# Patient Record
Sex: Male | Born: 1965 | Race: Black or African American | Hispanic: No | Marital: Married | Smoking: Never smoker
Health system: Southern US, Community
[De-identification: ages and names within clinical notes are randomized; demographics above are authoritative.]

## PROBLEM LIST (undated history)

## (undated) DIAGNOSIS — H35039 Hypertensive retinopathy, unspecified eye: Secondary | ICD-10-CM

## (undated) DIAGNOSIS — I10 Essential (primary) hypertension: Secondary | ICD-10-CM

## (undated) DIAGNOSIS — B009 Herpesviral infection, unspecified: Secondary | ICD-10-CM

## (undated) DIAGNOSIS — N289 Disorder of kidney and ureter, unspecified: Secondary | ICD-10-CM

## (undated) DIAGNOSIS — H3320 Serous retinal detachment, unspecified eye: Secondary | ICD-10-CM

## (undated) DIAGNOSIS — N186 End stage renal disease: Secondary | ICD-10-CM

## (undated) DIAGNOSIS — A401 Sepsis due to streptococcus, group B: Secondary | ICD-10-CM

## (undated) DIAGNOSIS — E11319 Type 2 diabetes mellitus with unspecified diabetic retinopathy without macular edema: Secondary | ICD-10-CM

## (undated) DIAGNOSIS — D649 Anemia, unspecified: Secondary | ICD-10-CM

## (undated) HISTORY — DX: Disorder of kidney and ureter, unspecified: N28.9

## (undated) HISTORY — DX: Herpesviral infection, unspecified: B00.9

## (undated) HISTORY — DX: Sepsis due to Streptococcus, group B: A40.1

## (undated) HISTORY — DX: Hypertensive retinopathy, unspecified eye: H35.039

## (undated) HISTORY — PX: EYE SURGERY: SHX253

## (undated) HISTORY — PX: CATARACT EXTRACTION W/ INTRAOCULAR LENS  IMPLANT, BILATERAL: SHX1307

## (undated) HISTORY — PX: CATARACT EXTRACTION: SUR2

## (undated) HISTORY — DX: Serous retinal detachment, unspecified eye: H33.20

## (undated) NOTE — *Deleted (*Deleted)
[facility] Note  06/06/2020     CHIEF COMPLAINT Patient presents for No chief complaint on file.   HISTORY OF PRESENT ILLNESS: Randy Oneal is a 63 y.o. male who presents to the clinic today for:   Pt states he is doing well  Referring physician: [name], MD [address] [place],  [place] [address]  HISTORICAL INFORMATION:   Selected notes from the MEDICAL RECORD NUMBER Referred by Dr.Mark Gershon Crane for concern of decreased vision post cataract sx LEE:  Ocular Hx- PMH-   CURRENT MEDICATIONS: Current Outpatient Medications (Ophthalmic Drugs)  Medication Sig  . brimonidine (ALPHAGAN) 0.2 % ophthalmic solution Place 1 drop into both eyes daily.  . Bromfenac Sodium (PROLENSA) 0.07 % SOLN Place 1 drop into both eyes 4 (four) times daily.  . prednisoLONE acetate (PRED FORTE) 1 % ophthalmic suspension Place 1 drop into both eyes 4 (four) times daily.   No current facility-administered medications for this visit. (Ophthalmic Drugs)   Current Outpatient Medications (Other)  Medication Sig  . acetaminophen (TYLENOL) 500 MG tablet Take 1,000 mg by mouth every 6 (six) hours as needed for moderate pain or headache.  Marland Kitchen amLODipine (NORVASC) 2.5 MG tablet Take 1 tablet (2.5 mg total) by mouth daily.  Marland Kitchen atorvastatin (LIPITOR) 10 MG tablet Take 1 tablet (10 mg total) by mouth daily.  . calcium acetate (PHOSLO) 667 MG capsule Take 1,334 mg by mouth 3 (three) times daily with meals.  . Calcium Acetate 668 (169 Ca) MG TABS TAKE 2 TABLETS BY MOUTH THREE TIMES DAILY WITH MEALS  . cinacalcet (SENSIPAR) 30 MG tablet Take by mouth.  . Continuous Blood Gluc Sensor (FREESTYLE LIBRE SENSOR SYSTEM) MISC Use one sensor every 10 days.  . Darbepoetin Alfa (ARANESP) 60 MCG/0.3ML SOSY injection Inject 0.3 mLs (60 mcg total) into the vein every Monday with hemodialysis.  . famotidine (PEPCID) 40 MG tablet Take 0.5 tablets (20 mg total) by mouth at bedtime.   . insulin lispro (HUMALOG KWIKPEN) 100 UNIT/ML KwikPen Inject 0.06 mLs (6 Units total) into the skin daily with breakfast AND 0.07 mLs (7 Units total) 2 (two) times daily before lunch and supper. (Patient taking differently: inject 5-9 unit with each meal)  . Insulin Pen Needle (BD PEN NEEDLE NANO 2ND GEN) 32G X 4 MM MISC Four times daily  . Methoxy PEG-Epoetin Beta (MIRCERA IJ) Mircera  . metoprolol tartrate (LOPRESSOR) 25 MG tablet Take 1 tablet (25 mg total) by mouth 2 (two) times daily.  Marland Kitchen MICROLET LANCETS MISC USE FOUR TIMES A DAY AS DIRECTED.  Marland Kitchen nystatin (MYCOSTATIN/NYSTOP) powder Apply topically 2 (two) times daily. To groin and scrotum (Patient taking differently: Apply 1 g topically 2 (two) times daily as needed (rash). To groin and scrotum)  . ONETOUCH ULTRA test strip USE TO TEST 4 TIMES DAILY.  . sucroferric oxyhydroxide (VELPHORO) 500 MG chewable tablet Chew 500 mg by mouth 3 (three) times daily with meals.  . TRESIBA FLEXTOUCH 100 UNIT/ML SOPN FlexTouch Pen Inject 0.19 mLs (19 Units total) into the skin at bedtime. (Patient taking differently: Inject 22 Units into the skin at bedtime. )   No current facility-administered medications for this visit. (Other)      REVIEW OF SYSTEMS:    ALLERGIES Allergies  Allergen Reactions  . Tramadol Nausea And Vomiting    PAST MEDICAL HISTORY Past Medical History:  Diagnosis Date  . Anemia   . Diabetes mellitus    Type  2   . Diabetic retinopathy of both eyes ([place]) 01/31/2019   Dr Coralyn Pear 01/2019  . ESRD (end stage renal disease) ([place])    [place] Frenisus  . Herpes simplex virus (HSV) infection    OU  . Hypertension   . Retinal detachment    OS  . Sepsis due to Streptococcus, group B ([place]) 10/05/2018   Past Surgical History:  Procedure Laterality Date  . [place] VITRECTOMY WITH 20 GAUGE MVR PORT FOR MACULAR HOLE Right 02/24/2019   Procedure: 25 GAUGE PARS PLANA VITRECTOMY WITH 20 GAUGE MVR PORT FOR MACULAR HOLE;   Surgeon: Bernarda Caffey, MD;  Location: [place];  Service: Ophthalmology;  Laterality: Right;  . APPLICATION OF WOUND VAC Left 08/27/2018   Procedure: APPLICATION OF WOUND VAC, left neck;  Surgeon: Gaye Pollack, MD;  Location: [facility] [place];  Service: Thoracic;  Laterality: Left;  . APPLICATION OF WOUND VAC Left 08/30/2018   Procedure: APPLICATION OF WOUND VAC;  Surgeon: Gaye Pollack, MD;  Location: [place];  Service: Thoracic;  Laterality: Left;  . AV FISTULA PLACEMENT Left 02/15/2020   Procedure: LEFT ARM ARTERIOVENOUS (AV) FISTULA CREATION;  Surgeon: Waynetta Sandy, MD;  Location: [facility];  Service: Vascular;  Laterality: Left;  . CATARACT EXTRACTION Bilateral   . CATARACT EXTRACTION W/ INTRAOCULAR LENS  IMPLANT, BILATERAL    . COLONOSCOPY  06/24/2011   Procedure: COLONOSCOPY;  Surgeon: [name], MD;  [facility];  Service: Endoscopy;  Laterality: N/A;  8:30 AM  . EYE SURGERY    . GAS/FLUID EXCHANGE Left 03/31/2019   Procedure: Gas/Fluid Exchange;  Surgeon: Bernarda Caffey, MD;  Location: [facility];  Service: Ophthalmology;  Laterality: Left;  . INJECTION OF SILICONE OIL  [id number]   Procedure: Injection Of Silicone Oil;  Surgeon: Bernarda Caffey, MD;  Location: [place];  Service: Ophthalmology;;  . INSERTION OF DIALYSIS CATHETER Right 12/05/2019   Procedure: INSERTION OF DIALYSIS CATHETER RIGHT SUBCLAVIAN;  Surgeon: Virl Cagey, MD;  [facility];  Service: General;  Laterality: Right;  . INSERTION OF DIALYSIS CATHETER Right 12/07/2019   Procedure: Minor Dialysis catheter in place- need to reposition/ adjust catheter to help with flows;  Surgeon: Virl Cagey, MD;  Location: [facility];  Service: General;  Laterality: Right;  procedure room case with 1% lidocaine, full sterile drape,  towels, full gown, large chloraprep, 4-0 Monocryl and dermabond   . INSERTION OF DIALYSIS CATHETER Right 12/08/2019   Procedure: INSERTION OF DIALYSIS CATHETER EXCHANGE;  Surgeon: Virl Cagey, MD;  [facility];  Service: General;  Laterality: Right;  . MEMBRANE PEEL Right 02/24/2019   Procedure: Antoine Primas;  Surgeon: Bernarda Caffey, MD;  Location: [facility];  Service: Ophthalmology;  Laterality: Right;  . PARS PLANA VITRECTOMY Left 03/31/2019   Procedure: PARS PLANA VITRECTOMY WITH 25 GAUGE WITH MEMBRANE PEEL;  Surgeon: Bernarda Caffey, MD;  Location: [place];  Service: Ophthalmology;  Laterality: Left;  . PHOTOCOAGULATION WITH LASER Right 02/24/2019   Procedure: Photocoagulation With Laser;  Surgeon: Bernarda Caffey, MD;  Location: [place];  Service: Ophthalmology;  Laterality: Right;  . PHOTOCOAGULATION WITH LASER Left 03/31/2019   Procedure: Photocoagulation With Laser;  Surgeon: Bernarda Caffey, MD;  Location: [facility];  Service: Ophthalmology;  Laterality: Left;  . RETINAL DETACHMENT SURGERY Left 03/31/2019   [facility] Rep[facility] - Dr. Bernarda Caffey  . SILICON OIL REMOVAL Right [id number]   Procedure: Silicon Oil Removal;  Surgeon: Bernarda Caffey, MD;  Location: [facility];  Service: Ophthalmology;  Laterality: Right;  . STERNAL WOUND DEBRIDEMENT Left 08/27/2018   Procedure: Incision and DEBRIDEMENT Left Chest, Neck and Mediastinum;  Surgeon: Gaye Pollack, MD;  [facility];  Service: Thoracic;  Laterality: Left;  . STERNAL WOUND DEBRIDEMENT Left 08/30/2018   Procedure: WOUND VAC CHANGE, LEFT CHEST AND NECK, POSSIBLE DEBRIDEMENT;  Surgeon: Gaye Pollack, MD;  [facility];  Service: Thoracic;  Laterality: Left;    FAMILY HISTORY Family History  Problem Relation Age of Onset  . Hypertension Mother   . Colon cancer Neg Hx     SOCIAL HISTORY Social History   Tobacco Use  . Smoking status: Never Smoker  . Smokeless tobacco: Never Used  Vaping Use  . Vaping Use: Never used  Substance Use Topics  . Alcohol use: No  . Drug use: No         OPHTHALMIC EXAM:  Not recorded     IMAGING AND PROCEDURES  Imaging and Procedures for @TODAY @           ASSESSMENT/PLAN:     ICD-10-CM   1. Proliferative diabetic retinopathy of both eyes with macular edema associated with type 2 diabetes mellitus ([place])  [id number]   2. 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 Vitreous hemorrhage of both eyes ([place])  H43.13   5. Retinal edema  H35.81   6. Essential hypertension  I10   7. Hypertensive retinopathy of both eyes  H35.033   8. Pseudophakia of both eyes  Z96.1   9. CME (cystoid macular edema), left  H35.352     1-5. Proliferative diabetic retinopathy with DME, TRD, and vitreous hemorrhage OU (OD > OS)  - pt with complex medical history with hospitalization in Jan-Feb 2020 for bacteremia and abscess  - history of poor glycemic control for years  - s/p IVA OD #1 (06.20.20), #2 (07.01.20), #3 (09.11.20), #4 (10.09.20), #5 (11.06.20)  - s/p IVA OS #1 (06.20.20), #2 (07.01.20), #3 (08.13.20), #4 (09.11.20)  #5 (11.06.20)  - s/p IVE OU #1 (12.04.20) - sample, #2 (01.06.21)  - s/p STK OS #1 (10.09.20)  - s/p PRP OS (06.10.20)  - exam showed diffuse vitreous and preretinal hemorrhage + scattered fibrosis OU   - pre-op clearing of VH OD improved visualization of posterior pole --+macular TRD OD; and ?regressing NVD OS  - pre-op OCT shows preretinal fibrosis w/ traction OU (OD > OS)  - s/p PPV/PFC/EL/FAX/silicone oil OD, 123XX123  - s/p subconj silicon oil removal OD 8.20.20  - s/p PPV/EL/FAX/silicone oil OS, 123XX123  - BCVA OD stable at 20/250; OS improved to [id number]  - subconj silicon oil prolapse OD -- persistent but improving             - OD: improved fibrosis, retina attached and in good position under oil, +macular edema, slightly improved; trace residual pockets of SRF inferiorly - improved  - OD with post op CN VI palsy / abduction deficit -- slightly improved today -- continue monitoring -- may need further eval once stable post op  - OS: improved fibrosis, focal RD reattached under oil; interval improvement in  IRF and ERM  - **good response to IVE OU #1**  - recommend IVE OU #3 today, 10.27.21  - [employer] benefits investigation started 12.4.20 -- approved as of 01.06.21  - pt wishes to proceed  - RBA of procedure discussed, questions answered  - informed  consent obtained and signed  - see procedure note  - IOP 19,21             - cont brimonidine bid daily OU - pt ran out, given bottle of Cosopt in office until he can get a refill                                - cont PF 4x/day OU  - cont Prolensa QID OU             - can dc face down positioning; avoid laying flat on back             - post op drop and positioning instructions reviewed             - tylenol/ibuprofen for pain  - f/u 4-5 weeks -- DFE/OCT/possible injection  6,7. Hypertensive retinopathy OU  - discussed importance of tight BP control  - monitor  8. Pseudophakia OU  - s/p CE with IOL Gershon Crane)  9. CME OS  - s/p STK OS #1 as above -- still present in ST quad  - central IRF -- slightly improved post IVE  - complicated by worsening ERM and DME  - continue PF and prolensa qid OS   Ophthalmic Meds Ordered this visit:  No orders of the defined types were placed in this encounter.      No follow-ups on file.  There are no Patient Instructions on file for this visit.   Explained the diagnoses, plan, and follow up with the patient and they expressed understanding.  Patient expressed understanding of the importance of proper follow up care.   This document serves as a record of services personally performed by Gardiner Sleeper, MD, PhD. It was created on their behalf by Roselee Nova, COMT. The creation of this record is the provider's dictation and/or activities during the visit.  Electronically signed by: Roselee Nova, COMT 06/05/20 8:43 AM    Gardiner Sleeper, M.D., Ph.D. Diseases & Surgery of the Retina and [place] 08/17/2019      Abbreviations: M myopia (nearsighted); A  astigmatism; H hyperopia (farsighted); P presbyopia; Mrx spectacle prescription;  CTL contact lenses; OD right eye; OS left eye; OU both eyes  XT exotropia; ET esotropia; PEK punctate epithelial keratitis; PEE punctate epithelial erosions; DES dry eye syndrome; MGD meibomian gland dysfunction; ATs artificial tears; PFAT's preservative free artificial tears; [place] nuclear sclerotic cataract; PSC posterior subcapsular cataract; ERM epi-retinal membrane; PVD posterior vitreous detachment; RD retinal detachment; DM diabetes mellitus; DR diabetic retinopathy; NPDR non-proliferative diabetic retinopathy; PDR proliferative diabetic retinopathy; CSME clinically significant macular edema; DME diabetic macular edema; dbh dot blot hemorrhages; CWS cotton wool spot; POAG primary open angle glaucoma; C/D cup-to-disc ratio; HVF humphrey visual field; GVF goldmann visual field; OCT optical coherence tomography; IOP intraocular pressure; BRVO Branch retinal vein occlusion; CRVO central retinal vein occlusion; CRAO central retinal artery occlusion; BRAO branch retinal artery occlusion; RT retinal tear; SB scleral buckle; PPV pars plana vitrectomy; VH Vitreous hemorrhage; PRP panretinal laser photocoagulation; IVK intravitreal kenalog; VMT vitreomacular traction; MH Macular hole;  NVD neovascularization of the disc; NVE neovascularization elsewhere; AREDS age related eye disease study; ARMD age related macular degeneration; POAG primary open angle glaucoma; EBMD epithelial/anterior basement membrane dystrophy; ACIOL anterior chamber intraocular lens; IOL intraocular lens; PCIOL posterior chamber intraocular lens; Phaco/IOL phacoemulsification with intraocular lens placement; [place] photorefractive keratectomy;  LASIK laser assisted in situ keratomileusis; HTN hypertension; DM diabetes mellitus; COPD chronic obstructive pulmonary disease

---
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
## 2000-09-08 ENCOUNTER — Encounter: Admission: RE | Admit: 2000-09-08 | Discharge: 2000-12-07 | Payer: Self-pay | Admitting: Family Medicine

## 2002-04-21 ENCOUNTER — Emergency Department (HOSPITAL_COMMUNITY): Admission: EM | Admit: 2002-04-21 | Discharge: 2002-04-21 | Payer: Self-pay | Admitting: *Deleted

## 2006-12-26 ENCOUNTER — Emergency Department (HOSPITAL_COMMUNITY): Admission: EM | Admit: 2006-12-26 | Discharge: 2006-12-27 | Payer: Self-pay | Admitting: Emergency Medicine

## 2011-05-20 ENCOUNTER — Other Ambulatory Visit: Payer: Self-pay | Admitting: Family Medicine

## 2011-05-20 DIAGNOSIS — R634 Abnormal weight loss: Secondary | ICD-10-CM

## 2011-05-22 ENCOUNTER — Ambulatory Visit (HOSPITAL_COMMUNITY)
Admission: RE | Admit: 2011-05-22 | Discharge: 2011-05-22 | Disposition: A | Discharge status: Home / Self Care | Payer: 59 | Source: Ambulatory Visit | Attending: Family Medicine | Admitting: Family Medicine

## 2011-05-22 ENCOUNTER — Encounter (HOSPITAL_COMMUNITY): Payer: Self-pay

## 2011-05-22 ENCOUNTER — Other Ambulatory Visit: Payer: Self-pay | Admitting: Family Medicine

## 2011-05-22 DIAGNOSIS — R634 Abnormal weight loss: Secondary | ICD-10-CM

## 2011-05-22 DIAGNOSIS — R1012 Left upper quadrant pain: Secondary | ICD-10-CM | POA: Insufficient documentation

## 2011-05-22 DIAGNOSIS — R1011 Right upper quadrant pain: Secondary | ICD-10-CM | POA: Insufficient documentation

## 2011-05-22 DIAGNOSIS — I10 Essential (primary) hypertension: Secondary | ICD-10-CM | POA: Insufficient documentation

## 2011-05-22 HISTORY — DX: Essential (primary) hypertension: I10

## 2011-05-22 MED ORDER — IOHEXOL 300 MG/ML  SOLN
100.0000 mL | Freq: Once | INTRAMUSCULAR | Status: AC | PRN
  Administered 2011-05-22: 100 mL via INTRAVENOUS

## 2011-05-23 ENCOUNTER — Telehealth: Payer: Self-pay | Admitting: Gastroenterology

## 2011-05-23 NOTE — Telephone Encounter (Signed)
Pt returned your call- he can be reached at [phone]

## 2011-05-26 NOTE — Telephone Encounter (Signed)
Called [phone] and VM not set up. Called his wife, Clearence Ped number [phone] and left message for a return call.

## 2011-05-27 ENCOUNTER — Other Ambulatory Visit: Payer: Self-pay

## 2011-05-27 ENCOUNTER — Telehealth: Payer: Self-pay

## 2011-05-27 DIAGNOSIS — Z139 Encounter for screening, unspecified: Secondary | ICD-10-CM

## 2011-05-27 NOTE — Telephone Encounter (Signed)
Called [place] again. She gave me pt's cell number of [phone]. I called and got many rings and no answer.

## 2011-05-27 NOTE — Telephone Encounter (Signed)
Pt called back and was triaged.

## 2011-05-28 NOTE — Telephone Encounter (Signed)
Gastroenterology Pre-Procedure Form  Request Date: 05/27/2011      Requesting Physician: Dr. Sallee Lange     PATIENT INFORMATION:  Randy Oneal is a 45 y.o., male (DOB=09/22/1965).  PROCEDURE: Procedure(s) requested: colonoscopy Procedure Reason: screening for colon cancer  PATIENT REVIEW QUESTIONS: The patient reports the following:   1. Diabetes Melitis: yes  2. Joint replacements in the past 12 months: no 3. Major health problems in the past 3 months: no 4. Has an artificial valve or MVP:no 5. Has been advised in past to take antibiotics in advance of a procedure like teeth cleaning: no}    MEDICATIONS & ALLERGIES:    Patient reports the following regarding taking any blood thinners:   Plavix? no Aspirin?no Coumadin?  no  Patient confirms/reports the following medications:  Current Outpatient Prescriptions  Medication Sig Dispense Refill  . amLODipine (NORVASC) 10 MG tablet Take 10 mg by mouth daily.        . insulin lispro protamine-insulin lispro (HUMALOG 75/25) (75-25) 100 UNIT/ML SUSP Inject into the skin. Takes 15 units in the AM and 10 units in the PM       . lisinopril (PRINIVIL,ZESTRIL) 20 MG tablet Take 20 mg by mouth daily.          Patient confirms/reports the following allergies:  No Known Allergies  Patient is appropriate to schedule for requested procedure(s): yes  AUTHORIZATION INFORMATION Primary Insurance:   ID #:   Group #:  Pre-Cert / Auth required:  [id number]}Pre-Cert / Auth #:   Secondary Insurance:   ID #:  Group #:  Pre-Cert / Auth required:  Pre-Cert / Auth #:   No orders of the defined types were placed in this encounter.    SCHEDULE INFORMATION: Procedure has been scheduled as follows:  Date: 06/24/2011    Time: 8:15 AM  Location: [facility] Short Stay  This Gastroenterology Pre-Precedure Form is being routed to the following provider(s) for review: Barney Drain, MD

## 2011-05-28 NOTE — Telephone Encounter (Signed)
[place]

## 2011-05-29 NOTE — Telephone Encounter (Signed)
Will pt need any insulin adjustments?

## 2011-05-29 NOTE — Telephone Encounter (Signed)
Routed to Dr. Oneida Alar.

## 2011-06-06 NOTE — Telephone Encounter (Signed)
TAKE 1/2 INSULIN PM BEFORE AND HOLD AM INSULIN PREPOPIK SPLIT DOSING

## 2011-06-09 ENCOUNTER — Encounter: Payer: Self-pay | Admitting: Gastroenterology

## 2011-06-09 NOTE — Telephone Encounter (Signed)
Done and mailed

## 2011-06-09 NOTE — Telephone Encounter (Signed)
Informed pt's wife. He will be by to pick up the prep and instructions. Placed at the front for pick up.

## 2011-06-09 NOTE — Telephone Encounter (Signed)
To [place] for the prep instructions on the new prep.

## 2011-06-23 MED ORDER — SODIUM CHLORIDE 0.45 % IV SOLN
Freq: Once | INTRAVENOUS | Status: AC
  Administered 2011-06-24: 08:00:00 via INTRAVENOUS

## 2011-06-24 ENCOUNTER — Ambulatory Visit (HOSPITAL_COMMUNITY)
Admission: RE | Admit: 2011-06-24 | Discharge: 2011-06-24 | Disposition: A | Discharge status: Home / Self Care | Payer: 59 | Source: Ambulatory Visit | Attending: Gastroenterology | Admitting: Gastroenterology

## 2011-06-24 ENCOUNTER — Encounter (HOSPITAL_COMMUNITY)
Admission: RE | Disposition: A | Discharge status: Home / Self Care | Payer: Self-pay | Source: Ambulatory Visit | Attending: Gastroenterology

## 2011-06-24 ENCOUNTER — Encounter (HOSPITAL_COMMUNITY): Payer: Self-pay | Admitting: *Deleted

## 2011-06-24 ENCOUNTER — Other Ambulatory Visit: Payer: Self-pay | Admitting: Gastroenterology

## 2011-06-24 DIAGNOSIS — Z1211 Encounter for screening for malignant neoplasm of colon: Secondary | ICD-10-CM

## 2011-06-24 DIAGNOSIS — I10 Essential (primary) hypertension: Secondary | ICD-10-CM | POA: Insufficient documentation

## 2011-06-24 DIAGNOSIS — Z139 Encounter for screening, unspecified: Secondary | ICD-10-CM

## 2011-06-24 DIAGNOSIS — K648 Other hemorrhoids: Secondary | ICD-10-CM

## 2011-06-24 DIAGNOSIS — E119 Type 2 diabetes mellitus without complications: Secondary | ICD-10-CM | POA: Insufficient documentation

## 2011-06-24 DIAGNOSIS — Z79899 Other long term (current) drug therapy: Secondary | ICD-10-CM | POA: Insufficient documentation

## 2011-06-24 DIAGNOSIS — Z01812 Encounter for preprocedural laboratory examination: Secondary | ICD-10-CM | POA: Insufficient documentation

## 2011-06-24 DIAGNOSIS — D126 Benign neoplasm of colon, unspecified: Secondary | ICD-10-CM

## 2011-06-24 HISTORY — PX: COLONOSCOPY: SHX5424

## 2011-06-24 LAB — GLUCOSE, CAPILLARY

## 2011-06-24 SURGERY — COLONOSCOPY
Anesthesia: Moderate Sedation

## 2011-06-24 MED ORDER — MIDAZOLAM HCL 5 MG/5ML IJ SOLN
INTRAMUSCULAR | Status: AC
  Filled 2011-06-24: qty 10

## 2011-06-24 MED ORDER — MIDAZOLAM HCL 5 MG/5ML IJ SOLN
INTRAMUSCULAR | Status: DC | PRN
  Administered 2011-06-24: 2 mg via INTRAVENOUS

## 2011-06-24 MED ORDER — MEPERIDINE HCL 100 MG/ML IJ SOLN
INTRAMUSCULAR | Status: AC
  Filled 2011-06-24: qty 2

## 2011-06-24 MED ORDER — MEPERIDINE HCL 100 MG/ML IJ SOLN
INTRAMUSCULAR | Status: DC | PRN
  Administered 2011-06-24: 50 mg via INTRAVENOUS

## 2011-06-24 MED ORDER — STERILE WATER FOR IRRIGATION IR SOLN
Status: DC | PRN
  Administered 2011-06-24: 09:00:00

## 2011-06-24 NOTE — H&P (Signed)
  Primary Care Physician:  Sallee Lange, MD, MD Primary Gastroenterologist:  Dr. Oneida Alar  Pre-Procedure History & Physical: HPI:  Randy Oneal is a 45 y.o. male here for colon cancer screening.  Past Medical History  Diagnosis Date  . Hypertension   . Diabetes mellitus     Insulin dependent    History reviewed. No pertinent past surgical history.  Prior to Admission medications   Medication Sig Start Date End Date Taking? Authorizing Provider  amLODipine (NORVASC) 10 MG tablet Take 10 mg by mouth daily.     Yes Historical Provider, MD  insulin lispro protamine-insulin lispro (HUMALOG 75/25) (75-25) 100 UNIT/ML SUSP Inject into the skin. Takes 15 units in the AM and 10 units in the PM    Yes Historical Provider, MD  lisinopril (PRINIVIL,ZESTRIL) 20 MG tablet Take 20 mg by mouth daily.     Yes Historical Provider, MD    Allergies as of 05/27/2011  . (No Known Allergies)    Family History  Problem Relation Age of Onset  . Colon cancer Neg Hx   NO POLYPS  History   Social History  . Marital Status: Married    Spouse Name: N/A    Number of Children: N/A  . Years of Education: N/A   Occupational History  . Not on file.   Social History Main Topics  . Smoking status: Never Smoker   . Smokeless tobacco: Not on file  . Alcohol Use: No  . Drug Use: No  . Sexually Active:    Other Topics Concern  . Not on file   Social History Narrative  . No narrative on file    Review of Systems: See HPI, otherwise negative ROS   Physical Exam: BP 112/80  Pulse 72  Temp(Src) 97.9 F (36.6 C) (Oral)  Resp 18  Ht 6\' 1"  (1.854 m)  Wt 184 lb (83.462 kg)  BMI 24.28 kg/m2  SpO2 100% General:   Alert,  pleasant and cooperative in NAD Head:  Normocephalic and atraumatic. Neck:  Supple; no masses or thyromegaly. Lungs:  Clear throughout to auscultation.    Heart:  Regular rate and rhythm. Abdomen:  Soft, nontender and nondistended. Normal bowel sounds, without guarding, and  without rebound.   Neurologic:  Alert and  oriented x4;  grossly normal neurologically.  Impression/Plan:   AVERAGE RISK SCREENING  PLAN: 1. TCS TODAY

## 2011-07-01 ENCOUNTER — Encounter (HOSPITAL_COMMUNITY): Payer: Self-pay | Admitting: Gastroenterology

## 2011-07-02 ENCOUNTER — Telehealth: Payer: Self-pay | Admitting: Gastroenterology

## 2011-07-02 NOTE — Telephone Encounter (Signed)
Called, VM not set up. Will mail letter with results.

## 2011-07-02 NOTE — Telephone Encounter (Signed)
Please call pt. HE HAD A simple adenoma removed from HIS colon. TCS in 10 years. High fiber diet.

## 2011-07-10 NOTE — Telephone Encounter (Signed)
Reminder in epic to have tcs in 10 years

## 2011-07-14 NOTE — Telephone Encounter (Signed)
Results Cc to PCP  

## 2013-01-12 ENCOUNTER — Other Ambulatory Visit: Payer: Self-pay | Admitting: Family Medicine

## 2013-03-15 ENCOUNTER — Other Ambulatory Visit: Payer: Self-pay | Admitting: Family Medicine

## 2013-03-16 ENCOUNTER — Emergency Department (HOSPITAL_COMMUNITY)
Admission: EM | Admit: 2013-03-16 | Discharge: 2013-03-16 | Disposition: A | Discharge status: Home / Self Care | Payer: No Typology Code available for payment source | Attending: Emergency Medicine | Admitting: Emergency Medicine

## 2013-03-16 ENCOUNTER — Encounter (HOSPITAL_COMMUNITY): Payer: Self-pay

## 2013-03-16 DIAGNOSIS — E119 Type 2 diabetes mellitus without complications: Secondary | ICD-10-CM | POA: Insufficient documentation

## 2013-03-16 DIAGNOSIS — G510 Bell's palsy: Secondary | ICD-10-CM | POA: Insufficient documentation

## 2013-03-16 DIAGNOSIS — I10 Essential (primary) hypertension: Secondary | ICD-10-CM | POA: Insufficient documentation

## 2013-03-16 DIAGNOSIS — M6281 Muscle weakness (generalized): Secondary | ICD-10-CM | POA: Insufficient documentation

## 2013-03-16 DIAGNOSIS — Z79899 Other long term (current) drug therapy: Secondary | ICD-10-CM | POA: Insufficient documentation

## 2013-03-16 DIAGNOSIS — Z794 Long term (current) use of insulin: Secondary | ICD-10-CM | POA: Insufficient documentation

## 2013-03-16 MED ORDER — PREDNISONE 10 MG PO TABS: ORAL_TABLET | ORAL | Status: DC

## 2013-03-16 NOTE — ED Notes (Signed)
Patient has hx of Bell's palsy about 7 years agol

## 2013-03-16 NOTE — ED Provider Notes (Signed)
CSN: [id number]     Arrival date & time 03/16/13  0926 History    This chart was scribed for Orpah Greek, *,  by Stacy Gardner, ED Scribe. The patient was seen in room [facility]03/[facility] and the patient's care was started at 9:49 AM.      No chief complaint on file.  (Consider location/radiation/quality/duration/timing/severity/associated sxs/prior Treatment) HPI HPI Comments: Randy Oneal is a 47 y.o. male who presents to the Emergency Department complaining of  .  Last Monday had right facial pain and weakness after waking up morning one day PTA. Pt mention that he feels fine. Pt reports that he is having trouble properly chewing his food. Pt reports that nothing seems to worsen or relieve the pain.   Past Medical History  Diagnosis Date  . Hypertension   . Diabetes mellitus     Insulin dependent   Past Surgical History  Procedure Laterality Date  . Colonoscopy  06/24/2011    Procedure: COLONOSCOPY;  Surgeon: [name], MD;  [facility];  Service: Endoscopy;  Laterality: N/A;  8:30 AM   Family History  Problem Relation Age of Onset  . Colon cancer Neg Hx    History  Substance Use Topics  . Smoking status: Never Smoker   . Smokeless tobacco: Not on file  . Alcohol Use: No    Review of Systems  Allergies  Review of patient's allergies indicates no known allergies.  Home Medications   Current Outpatient Rx  Name  Route  Sig  Dispense  Refill  . amLODipine (NORVASC) 10 MG tablet      TAKE ONE TABLET BY MOUTH ONCE DAILY.   30 tablet   1   . insulin lispro protamine-insulin lispro (HUMALOG 75/25) (75-25) 100 UNIT/ML SUSP   Subcutaneous   Inject into the skin. Takes 15 units in the AM and 10 units in the PM          . lisinopril (PRINIVIL,ZESTRIL) 20 MG tablet      TAKE (1) TABLET BY MOUTH ONCE DAILY FOR BLOOD PRESSURE.   30 tablet   1    There were no vitals taken for this visit. Physical Exam  Constitutional: He is oriented to  person, place, and time. He appears well-developed and well-nourished. No distress.  HENT:  Head: Normocephalic and atraumatic.  Right Ear: Hearing normal.  Left Ear: Hearing normal.  Nose: Nose normal.  Mouth/Throat: Oropharynx is clear and moist and mucous membranes are normal.  Eyes: Conjunctivae and EOM are normal. Pupils are equal, round, and reactive to light.  Neck: Normal range of motion. Neck supple.  Cardiovascular: Normal rate, regular rhythm, S1 normal and S2 normal.  Exam reveals no gallop and no friction rub.   No murmur heard. Pulmonary/Chest: Effort normal and breath sounds normal. No respiratory distress. He exhibits no tenderness.  Abdominal: Soft. Normal appearance and bowel sounds are normal. There is no hepatosplenomegaly. There is no tenderness. There is no rebound, no guarding, no tenderness at McBurney's point and negative Murphy's sign. No hernia.  Musculoskeletal: Normal range of motion.  Neurological: He is alert and oriented to person, place, and time. He has normal strength. A cranial nerve deficit (Right facial nerve palsy) is present. No sensory deficit. Coordination normal. GCS eye subscore is 4. GCS verbal subscore is 5. GCS motor subscore is 6.     Skin: Skin is warm, dry and intact. No rash noted. He is not diaphoretic. No cyanosis.  Psychiatric: He has  a normal mood and affect. His speech is normal and behavior is normal. Thought content normal.    ED Course  DIAGNOSTIC STUDIES: Oxygen Saturation is 99% on room air, normal by my interpretation.    COORDINATION OF CARE: 9:52 AM Discussed course of care with pt. Pt understands and agrees.    Procedures (including critical care time)  Labs Reviewed - No data to display No results found.  Diagnosis: Bell's palsy  MDM  Patient presents to the ER for evaluation of right facial numbness and weakness. Symptoms have been present for 2 days. Examination of the patient reveals right facial nerve palsy.  This is clearly including upper motor neuron and lower motor neuron, is not consistent with a central nervous system process such as stroke. No imaging is necessary. He is a diabetic, but it is felt that dosing with prednisone is still appropriate.  I personally performed the services described in this documentation, which was scribed in my presence. The recorded information has been reviewed and is accurate.   Orpah Greek, MD 03/16/13 1019

## 2013-03-16 NOTE — ED Notes (Signed)
Pt reports MOnday r side of face felt numb and r side of mouth was drooping.  Reports has not gotten any better or worse.  Denies any numbness or weakness anywhere else.  Denies headache.

## 2013-03-17 ENCOUNTER — Encounter: Payer: Self-pay | Admitting: *Deleted

## 2013-03-29 ENCOUNTER — Telehealth: Payer: Self-pay | Admitting: Family Medicine

## 2013-03-29 NOTE — Telephone Encounter (Signed)
Pt was at the ED on 03/16/13 with possible stroke, but was diagnosed with Bells Palsy. He was put on Prednisone for 10 days, wants to know if he is good to go for now? Or does he need further medications? He has a follow up appt for 04/05/13 in the morning. Was to follow up within a week from the 6th, but they just called today to schedule, unable to get them in this week. His symptoms are better, his face still droops a little, his Diabetes is off from the medication (numbers high).   [place]

## 2013-03-29 NOTE — Telephone Encounter (Signed)
Please give patient same-day visit slot for this week. We can see him Wednesday.

## 2013-03-29 NOTE — Telephone Encounter (Signed)
Office visit scheduled for 03/30/13

## 2013-03-30 ENCOUNTER — Ambulatory Visit (INDEPENDENT_AMBULATORY_CARE_PROVIDER_SITE_OTHER): Payer: No Typology Code available for payment source | Admitting: Family Medicine

## 2013-03-30 ENCOUNTER — Encounter: Payer: Self-pay | Admitting: Family Medicine

## 2013-03-30 VITALS — BP 120/86 | Ht 72.0 in | Wt 173.2 lb

## 2013-03-30 DIAGNOSIS — E119 Type 2 diabetes mellitus without complications: Secondary | ICD-10-CM | POA: Insufficient documentation

## 2013-03-30 DIAGNOSIS — G510 Bell's palsy: Secondary | ICD-10-CM

## 2013-03-30 MED ORDER — VALACYCLOVIR HCL 1 G PO TABS: 1000.0000 mg | ORAL_TABLET | Freq: Two times a day (BID) | ORAL | Status: AC

## 2013-03-30 NOTE — Patient Instructions (Signed)
Lacrilube for the eye at night  Valtrex one twice daily for 10 days  We will set you with ENT   Bell's Palsy Bell's palsy is a condition in which the muscles on one side of the face cannot move (paralysis). This is because the nerves in the face are paralyzed. It is most often thought to be caused by a virus. The virus causes swelling of the nerve that controls movement on one side of the face. The nerve travels through a tight space surrounded by bone. When the nerve swells, it can be compressed by the bone. This results in damage to the protective covering around the nerve. This damage interferes with how the nerve communicates with the muscles of the face. As a result, it can cause weakness or paralysis of the facial muscles.  Injury (trauma), tumor, and surgery may cause Bell's palsy, but most of the time the cause is unknown. It is a relatively common condition. It starts suddenly (abrupt onset) with the paralysis usually ending within 2 days. Bell's palsy is not dangerous. But because the eye does not close properly, you may need care to keep the eye from getting dry. This can include splinting (to keep the eye shut) or moistening with artificial tears. Bell's palsy very seldom occurs on both sides of the face at the same time. SYMPTOMS   Eyebrow sagging.  Drooping of the eyelid and corner of the mouth.  Inability to close one eye.  Loss of taste on the front of the tongue.  Sensitivity to loud noises. TREATMENT  The treatment is usually non-surgical. If the patient is seen within the first 24 to 48 hours, a short course of steroids may be prescribed, in an attempt to shorten the length of the condition. Antiviral medicines may also be used with the steroids, but it is unclear if they are helpful.  You will need to protect your eye, if you cannot close it. The cornea (clear covering over your eye) will become dry and can be damaged. Artificial tears can be used to keep your eye moist.  Glasses or an eye patch should be worn to protect your eye. PROGNOSIS  Recovery is variable, ranging from days to months. Although the problem usually goes away completely (about 80% of cases resolve), predicting the outcome is impossible. Most people improve within 3 weeks of when the symptoms began. Improvement may continue for 3 to 6 months. A small number of people have moderate to severe weakness that is permanent.  HOME CARE INSTRUCTIONS   If your caregiver prescribed medication to reduce swelling in the nerve, use as directed. Do not stop taking the medication unless directed by your caregiver.  Use moisturizing eye drops as needed to prevent drying of your eye, as directed by your caregiver.  Protect your eye, as directed by your caregiver.  Use facial massage and exercises, as directed by your caregiver.  Perform your normal activities, and get your normal rest. SEEK IMMEDIATE MEDICAL CARE IF:   There is pain, redness or irritation in the eye.  You or your child has an oral temperature above 102 F (38.9 C), not controlled by medicine. MAKE SURE YOU:   Understand these instructions.  Will watch your condition.  Will get help right away if you are not doing well or get worse. Document Released: 07/28/2005 Document Revised: 10/20/2011 Document Reviewed: 08/06/2009 [facility] Patient Information 2014 [place], [place].

## 2013-03-30 NOTE — Progress Notes (Signed)
  Subjective:    Patient ID: Randy Oneal, male    DOB: 19-Aug-1965, 47 y.o.   MRN: [id number]  HPI Patient is here to follow up on ER visit. He was diagnosed with Bell's Palsy and is concerned about face and speech going back to normal Patient states that he is not getting much control back from having this. He is trying his best but he still has weakness on the right side of his face. He states at times he I does not close as well as he thinks it should. He denies any chest tightness pressure pain shortness of breath. No other particular problems. No fevers. He is finishing up the prednisone. Never has had this before has history of diabetes. He follows with a diabetes specialist.  Review of Systems See above.    Objective:   Physical Exam  Lungs are clear hearts regular pulse normal blood pressure is good Bell's palsy noted.      Assessment & Plan:  Bell's palsy-add Valtrex twice daily for the next 10 days also ENT referral. Patient finishing up prednisone I don't recommend more at this point.

## 2013-04-05 ENCOUNTER — Ambulatory Visit: Payer: Self-pay | Admitting: Family Medicine

## 2013-06-01 ENCOUNTER — Other Ambulatory Visit: Payer: Self-pay | Admitting: Family Medicine

## 2013-09-09 ENCOUNTER — Other Ambulatory Visit: Payer: Self-pay | Admitting: Family Medicine

## 2013-09-11 ENCOUNTER — Other Ambulatory Visit: Payer: Self-pay | Admitting: Family Medicine

## 2013-10-27 ENCOUNTER — Other Ambulatory Visit: Payer: Self-pay | Admitting: Family Medicine

## 2013-12-24 ENCOUNTER — Other Ambulatory Visit: Payer: Self-pay | Admitting: Family Medicine

## 2014-01-30 ENCOUNTER — Encounter: Payer: Self-pay | Admitting: Family Medicine

## 2014-01-30 ENCOUNTER — Ambulatory Visit (INDEPENDENT_AMBULATORY_CARE_PROVIDER_SITE_OTHER): Payer: BC Managed Care – PPO | Admitting: Family Medicine

## 2014-01-30 VITALS — BP 114/80 | Ht 69.0 in | Wt 168.0 lb

## 2014-01-30 DIAGNOSIS — I10 Essential (primary) hypertension: Secondary | ICD-10-CM | POA: Insufficient documentation

## 2014-01-30 DIAGNOSIS — E119 Type 2 diabetes mellitus without complications: Secondary | ICD-10-CM

## 2014-01-30 DIAGNOSIS — R197 Diarrhea, unspecified: Secondary | ICD-10-CM

## 2014-01-30 DIAGNOSIS — R634 Abnormal weight loss: Secondary | ICD-10-CM

## 2014-01-30 DIAGNOSIS — Z23 Encounter for immunization: Secondary | ICD-10-CM

## 2014-01-30 MED ORDER — LISINOPRIL 20 MG PO TABS: ORAL_TABLET | ORAL | Status: DC

## 2014-01-30 MED ORDER — SILDENAFIL CITRATE 20 MG PO TABS: ORAL_TABLET | ORAL | Status: DC

## 2014-01-30 MED ORDER — AMLODIPINE BESYLATE 10 MG PO TABS: ORAL_TABLET | ORAL | Status: DC

## 2014-01-30 NOTE — Patient Instructions (Signed)
DASH Eating Plan  DASH stands for "Dietary Approaches to Stop Hypertension." The DASH eating plan is a healthy eating plan that has been shown to reduce high blood pressure (hypertension). Additional health benefits may include reducing the risk of type 2 diabetes mellitus, heart disease, and stroke. The DASH eating plan may also help with weight loss.  WHAT DO I NEED TO KNOW ABOUT THE DASH EATING PLAN?  For the DASH eating plan, you will follow these general guidelines:  · Choose foods with a percent daily value for sodium of less than 5% (as listed on the food label).  · Use salt-free seasonings or herbs instead of table salt or sea salt.  · Check with your health care provider or pharmacist before using salt substitutes.  · Eat lower-sodium products, often labeled as "lower sodium" or "no salt added."  · Eat fresh foods.  · Eat more vegetables, fruits, and low-fat dairy products.  · Choose whole grains. Look for the word "whole" as the first word in the ingredient list.  · Choose fish and skinless chicken or turkey more often than red meat. Limit fish, poultry, and meat to 6 oz (170 g) each day.  · Limit sweets, desserts, sugars, and sugary drinks.  · Choose heart-healthy fats.  · Limit cheese to 1 oz (28 g) per day.  · Eat more home-cooked food and less restaurant, buffet, and fast food.  · Limit fried foods.  · Cook foods using methods other than frying.  · Limit canned vegetables. If you do use them, rinse them well to decrease the sodium.  · When eating at a restaurant, ask that your food be prepared with less salt, or no salt if possible.  WHAT FOODS CAN I EAT?  Seek help from a dietitian for individual calorie needs.  Grains  Whole grain or whole wheat bread. Brown rice. Whole grain or whole wheat pasta. Quinoa, bulgur, and whole grain cereals. Low-sodium cereals. Corn or whole wheat flour tortillas. Whole grain cornbread. Whole grain crackers. Low-sodium crackers.  Vegetables  Fresh or frozen vegetables  (raw, steamed, roasted, or grilled). Low-sodium or reduced-sodium tomato and vegetable juices. Low-sodium or reduced-sodium tomato sauce and paste. Low-sodium or reduced-sodium canned vegetables.   Fruits  All fresh, canned (in natural juice), or frozen fruits.  Meat and Other Protein Products  Ground beef (85% or leaner), grass-fed beef, or beef trimmed of fat. Skinless chicken or turkey. Ground chicken or turkey. Pork trimmed of fat. All fish and seafood. Eggs. Dried beans, peas, or lentils. Unsalted nuts and seeds. Unsalted canned beans.  Dairy  Low-fat dairy products, such as skim or 1% milk, 2% or reduced-fat cheeses, low-fat ricotta or cottage cheese, or plain low-fat yogurt. Low-sodium or reduced-sodium cheeses.  Fats and Oils  Tub margarines without trans fats. Light or reduced-fat mayonnaise and salad dressings (reduced sodium). Avocado. Safflower, olive, or canola oils. Natural peanut or almond butter.  Other  Unsalted popcorn and pretzels.  The items listed above may not be a complete list of recommended foods or beverages. Contact your dietitian for more options.  WHAT FOODS ARE NOT RECOMMENDED?  Grains  White bread. White pasta. White rice. Refined cornbread. Bagels and croissants. Crackers that contain trans fat.  Vegetables  Creamed or fried vegetables. Vegetables in a cheese sauce. Regular canned vegetables. Regular canned tomato sauce and paste. Regular tomato and vegetable juices.  Fruits  Dried fruits. Canned fruit in light or heavy syrup. Fruit juice.  Meat and Other Protein   Products  Fatty cuts of meat. Ribs, chicken wings, bacon, sausage, bologna, salami, chitterlings, fatback, hot dogs, bratwurst, and packaged luncheon meats. Salted nuts and seeds. Canned beans with salt.  Dairy  Whole or 2% milk, cream, half-and-half, and cream cheese. Whole-fat or sweetened yogurt. Full-fat cheeses or blue cheese. Nondairy creamers and whipped toppings. Processed cheese, cheese spreads, or cheese  curds.  Condiments  Onion and garlic salt, seasoned salt, table salt, and sea salt. Canned and packaged gravies. Worcestershire sauce. Tartar sauce. Barbecue sauce. Teriyaki sauce. Soy sauce, including reduced sodium. Steak sauce. Fish sauce. Oyster sauce. Cocktail sauce. Horseradish. Ketchup and mustard. Meat flavorings and tenderizers. Bouillon cubes. Hot sauce. Tabasco sauce. Marinades. Taco seasonings. Relishes.  Fats and Oils  Butter, stick margarine, lard, shortening, ghee, and bacon fat. Coconut, palm kernel, or palm oils. Regular salad dressings.  Other  Pickles and olives. Salted popcorn and pretzels.  The items listed above may not be a complete list of foods and beverages to avoid. Contact your dietitian for more information.  WHERE CAN I FIND MORE INFORMATION?  National Heart, Lung, and [employer] Institute: www.nhlbi.nih.gov/health/health-topics/topics/dash/  Document Released: 07/17/2011 Document Revised: 08/02/2013 Document Reviewed: 06/01/2013  Exit[employer]® Patient Information ©2015 [employer]. This information is not intended to replace advice given to you by your health care provider. Make sure you discuss any questions you have with your health care provider.

## 2014-01-30 NOTE — Progress Notes (Signed)
   Subjective:    Patient ID: Randy Oneal, male    DOB: [birth year], 48 y.o.   MRN: [id number]  Hypertension This is a chronic problem. The current episode started more than 1 year ago. Pertinent negatives include no chest pain. Treatments tried: amlodipine, lisinopril.  Exercises at work, does some walking. Tries to limit fried foods. Eats vegetables and baked or grilled meat.  Bowels sometimes has loose appearance, no mucous, no blood  Concerns about weight loss.  Sees Dr. Dorris Fetch for diabetes.    Review of Systems  Constitutional: Negative for activity change, appetite change and fatigue.  HENT: Negative for congestion.   Respiratory: Negative for cough.   Cardiovascular: Negative for chest pain.  Gastrointestinal: Positive for diarrhea. Negative for nausea, vomiting and abdominal pain.  Endocrine: Positive for polyuria. Negative for polydipsia and polyphagia.  Genitourinary: Positive for enuresis.  Neurological: Negative for weakness.  Psychiatric/Behavioral: Negative for confusion.       Objective:   Physical Exam  Vitals reviewed. Constitutional: He appears well-nourished. No distress.  Cardiovascular: Normal rate, regular rhythm and normal heart sounds.   No murmur heard. Pulmonary/Chest: Effort normal and breath sounds normal. No respiratory distress.  Musculoskeletal: He exhibits no edema.  Lymphadenopathy:    He has no cervical adenopathy.  Neurological: He is alert.  Psychiatric: His behavior is normal.          Assessment & Plan:  ED

## 2014-03-18 LAB — CBC WITH DIFFERENTIAL/PLATELET
BASOS ABS: 0 10*3/uL (ref 0.0–0.1)
BASOS PCT: 0 % (ref 0–1)
Eosinophils Absolute: 0.2 10*3/uL (ref 0.0–0.7)
Eosinophils Relative: 4 % (ref 0–5)
HCT: 46.6 % (ref 39.0–52.0)
HEMOGLOBIN: 15.3 g/dL (ref 13.0–17.0)
Lymphocytes Relative: 33 % (ref 12–46)
Lymphs Abs: 1.4 10*3/uL (ref 0.7–4.0)
MCH: 26.6 pg (ref 26.0–34.0)
MCHC: 32.8 g/dL (ref 30.0–36.0)
MCV: 80.9 fL (ref 78.0–100.0)
MONOS PCT: 7 % (ref 3–12)
Monocytes Absolute: 0.3 10*3/uL (ref 0.1–1.0)
NEUTROS ABS: 2.4 10*3/uL (ref 1.7–7.7)
NEUTROS PCT: 56 % (ref 43–77)
Platelets: 179 10*3/uL (ref 150–400)
RBC: 5.76 MIL/uL (ref 4.22–5.81)
RDW: 14 % (ref 11.5–15.5)
WBC: 4.3 10*3/uL (ref 4.0–10.5)

## 2014-03-18 LAB — BASIC METABOLIC PANEL
BUN: 15 mg/dL (ref 6–23)
CO2: 24 mEq/L (ref 19–32)
Calcium: 9.6 mg/dL (ref 8.4–10.5)
Chloride: 99 mEq/L (ref 96–112)
Creat: 1.26 mg/dL (ref 0.50–1.35)
GLUCOSE: 432 mg/dL — AB (ref 70–99)
POTASSIUM: 5.1 meq/L (ref 3.5–5.3)
SODIUM: 133 meq/L — AB (ref 135–145)

## 2014-03-20 LAB — TISSUE TRANSGLUTAMINASE, IGA: TISSUE TRANSGLUTAMINASE AB, IGA: 11.3 U/mL (ref <20)

## 2014-04-24 ENCOUNTER — Telehealth: Payer: Self-pay | Admitting: Family Medicine

## 2014-04-24 NOTE — Telephone Encounter (Signed)
Review labs from [facility].

## 2014-04-25 ENCOUNTER — Emergency Department (HOSPITAL_COMMUNITY)
Admission: EM | Admit: 2014-04-25 | Discharge: 2014-04-25 | Disposition: A | Discharge status: Home / Self Care | Payer: BC Managed Care – PPO | Attending: Emergency Medicine | Admitting: Emergency Medicine

## 2014-04-25 ENCOUNTER — Telehealth: Payer: Self-pay | Admitting: *Deleted

## 2014-04-25 ENCOUNTER — Encounter (HOSPITAL_COMMUNITY): Payer: Self-pay | Admitting: Emergency Medicine

## 2014-04-25 DIAGNOSIS — I10 Essential (primary) hypertension: Secondary | ICD-10-CM | POA: Insufficient documentation

## 2014-04-25 DIAGNOSIS — R0602 Shortness of breath: Secondary | ICD-10-CM | POA: Diagnosis not present

## 2014-04-25 DIAGNOSIS — Z87448 Personal history of other diseases of urinary system: Secondary | ICD-10-CM | POA: Insufficient documentation

## 2014-04-25 DIAGNOSIS — Z794 Long term (current) use of insulin: Secondary | ICD-10-CM | POA: Insufficient documentation

## 2014-04-25 DIAGNOSIS — R739 Hyperglycemia, unspecified: Secondary | ICD-10-CM

## 2014-04-25 DIAGNOSIS — E119 Type 2 diabetes mellitus without complications: Secondary | ICD-10-CM | POA: Diagnosis not present

## 2014-04-25 DIAGNOSIS — Z0189 Encounter for other specified special examinations: Secondary | ICD-10-CM | POA: Diagnosis present

## 2014-04-25 DIAGNOSIS — Z79899 Other long term (current) drug therapy: Secondary | ICD-10-CM | POA: Insufficient documentation

## 2014-04-25 LAB — BASIC METABOLIC PANEL
ANION GAP: 15 (ref 5–15)
BUN: 17 mg/dL (ref 6–23)
CHLORIDE: 98 meq/L (ref 96–112)
CO2: 21 mEq/L (ref 19–32)
Calcium: 9.4 mg/dL (ref 8.4–10.5)
Creatinine, Ser: 1.16 mg/dL (ref 0.50–1.35)
GFR calc non Af Amer: 73 mL/min — ABNORMAL LOW (ref >90)
GFR, EST AFRICAN AMERICAN: 84 mL/min — AB (ref >90)
Glucose, Bld: 429 mg/dL — ABNORMAL HIGH (ref 70–99)
POTASSIUM: 4.3 meq/L (ref 3.7–5.3)
SODIUM: 134 meq/L — AB (ref 137–147)

## 2014-04-25 LAB — CBG MONITORING, ED: GLUCOSE-CAPILLARY: 450 mg/dL — AB (ref 70–99)

## 2014-04-25 MED ORDER — INSULIN ASPART 100 UNIT/ML FLEXPEN: 5.0000 [IU] | PEN_INJECTOR | Freq: Three times a day (TID) | SUBCUTANEOUS | Status: DC

## 2014-04-25 MED ORDER — INSULIN GLARGINE 100 UNIT/ML SOLOSTAR PEN
30.0000 [IU] | PEN_INJECTOR | Freq: Every day | SUBCUTANEOUS | Status: DC
Start: 2014-04-25 — End: 2015-11-22

## 2014-04-25 NOTE — Telephone Encounter (Signed)
Refill his insulins (2) with one month and one refill thanks, he nneds f/u ov with dr nida,if for some reason they cant see him then he needs to be seen here

## 2014-04-25 NOTE — Discharge Instructions (Signed)

## 2014-04-25 NOTE — Telephone Encounter (Signed)
Left message to return call 

## 2014-04-25 NOTE — ED Notes (Signed)
Pt reports he has been out of his insulin x2 weeks

## 2014-04-25 NOTE — ED Notes (Signed)
Had lab work at pcp Saturday,  And was called and told to come to er.

## 2014-04-25 NOTE — Telephone Encounter (Signed)
ER doctor called to speak with Dr. Nicki Reaper

## 2014-04-25 NOTE — ED Provider Notes (Signed)
CSN: [id number]     Arrival date & time 04/25/14  1246 History   First MD Initiated Contact with Patient 04/25/14 1257     Chief Complaint  Patient presents with  . Labs Only     HPI Patient presents for evaluation for abnormal labs He reports he was called by his specialist today for evaluation of labs Pt denies feeling any different - he denies CP/weakness. He reports mild SOB, but reports he feels he can't take a deep breath.  No fever.  No vomiting He reports he has been out of his medications recently His course is stable and nothing worsens his symptoms  Past Medical History  Diagnosis Date  . Hypertension   . Diabetes mellitus     Insulin dependent  . Renal insufficiency    Past Surgical History  Procedure Laterality Date  . Colonoscopy  06/24/2011    Procedure: COLONOSCOPY;  Surgeon: [name], MD;  Location: [facility];  Service: Endoscopy;  Laterality: N/A;  8:30 AM   Family History  Problem Relation Age of Onset  . Colon cancer Neg Hx   . Hypertension Mother    History  Substance Use Topics  . Smoking status: Never Smoker   . Smokeless tobacco: Not on file  . Alcohol Use: No    Review of Systems  Constitutional: Negative for fever.  Cardiovascular: Negative for chest pain.  Gastrointestinal: Negative for vomiting.  Neurological: Negative for weakness.  All other systems reviewed and are negative.     Allergies  Review of patient's allergies indicates no known allergies.  Home Medications   Prior to Admission medications   Medication Sig Start Date End Date Taking? Authorizing Provider  amLODipine (NORVASC) 10 MG tablet TAKE ONE TABLET BY MOUTH ONCE DAILY. 01/30/14  Yes [name], MD  insulin aspart (NOVOLOG FLEXPEN) 100 UNIT/ML SOPN FlexPen Inject 5 Units into the skin 3 (three) times daily with meals.    Yes Historical Provider, MD  Insulin Glargine (LANTUS SOLOSTAR) 100 UNIT/ML SOPN Inject 30 Units into the skin at bedtime.   Yes  Historical Provider, MD  lisinopril (PRINIVIL,ZESTRIL) 20 MG tablet TAKE (1) TABLET BY MOUTH ONCE DAILY FOR BLOOD PRESSURE. 01/30/14  Yes [name], MD  sildenafil (REVATIO) 20 MG tablet May take up to 5 before relations as directed 01/30/14  Yes [name], MD   BP 105/70  Pulse 92  Temp(Src) 99.6 F (37.6 C) (Oral)  Resp 18  Ht 5\' 10"  (1.778 m)  Wt 170 lb (77.111 kg)  BMI 24.39 kg/m2  SpO2 100% Physical Exam CONSTITUTIONAL: Well developed/well nourished HEAD: Normocephalic/atraumatic EYES: EOMI/PERRL ENMT: Mucous membranes moist NECK: supple no meningeal signs SPINE:entire spine nontender CV: S1/S2 noted, no murmurs/rubs/gallops noted LUNGS: Lungs are clear to auscultation bilaterally, no apparent distress ABDOMEN: soft, nontender, no rebound or guarding NEURO: Pt is awake/alert, moves all extremitiesx4 EXTREMITIES: pulses normal, full ROM, no LE edema noted SKIN: warm, color normal PSYCH: no abnormalities of mood noted  ED Course  Procedures  1:22 PM Outpatient labs from 04/22/14 glucose 473, creatinine 1.53, potassium 5.1, HGB A1C >18 Will repeat labs today 2:26 PM Spoke to dr Wolfgang Phoenix He will call in meds for patient to his pharmacy and he is supposed to see endocrinologist soon Pt reports he has f/u tomorrow morning Stable for d/c home  Labs Review Labs Reviewed  CBG MONITORING, ED - Abnormal; Notable for the following:    Glucose-Capillary 450 (*)  All other components within normal limits  BASIC METABOLIC PANEL     MDM   Final diagnoses:  Hyperglycemia    Nursing notes including past medical history and social history reviewed and considered in documentation Labs/vital reviewed and considered Previous records reviewed and considered     Sharyon Cable, MD 04/25/14 1427

## 2014-04-25 NOTE — Telephone Encounter (Signed)
[name] stated that Dr. Dorris Fetch wanted Randy Oneal to have blood work before visit and that is the blood work that was sent here. Patient is going to follow up with Dr. Dorris Fetch.

## 2014-04-25 NOTE — Telephone Encounter (Signed)
Rxs sent electronically to pharmacy.

## 2014-04-25 NOTE — Telephone Encounter (Signed)
Pt canceled her appointments with Dr Dorris Fetch, if the patient is not following up with them then the patient does need followup with our office within the next 30 days regarding the diabetes. Lab work was looked at creatinine 1.53. Glucose elevated. Connect with patient or his wife find out what is going on arrange followup with [place] appropriately

## 2014-04-25 NOTE — Addendum Note (Signed)
Addended by: Dairl Ponder on: 04/25/2014 03:43 PM   Modules accepted: Orders

## 2014-11-18 ENCOUNTER — Other Ambulatory Visit: Payer: Self-pay | Admitting: Family Medicine

## 2015-01-30 ENCOUNTER — Other Ambulatory Visit: Payer: Self-pay | Admitting: Family Medicine

## 2015-09-15 ENCOUNTER — Other Ambulatory Visit: Payer: Self-pay | Admitting: "Endocrinology

## 2015-10-22 ENCOUNTER — Other Ambulatory Visit: Payer: Self-pay | Admitting: "Endocrinology

## 2015-10-29 ENCOUNTER — Other Ambulatory Visit: Payer: Self-pay | Admitting: "Endocrinology

## 2015-10-31 ENCOUNTER — Telehealth: Payer: Self-pay | Admitting: Family Medicine

## 2015-10-31 NOTE — Telephone Encounter (Signed)
Please put a phone call into the patient or his wife. I thought this patient had moved his diabetic care to endocrinologist. If he is under the care of endocrinology they need to both be the one to refill his medicine. If he is still seen [place] for diabetes he may have one refill but he must schedule an office visit

## 2015-10-31 NOTE — Telephone Encounter (Signed)
Received rx request via fax for a refill on Novolog flexpen. Patient last seen June 2015. May we refill?

## 2015-11-01 ENCOUNTER — Other Ambulatory Visit: Payer: Self-pay | Admitting: "Endocrinology

## 2015-11-01 ENCOUNTER — Telehealth: Payer: Self-pay | Admitting: "Endocrinology

## 2015-11-01 DIAGNOSIS — IMO0002 Reserved for concepts with insufficient information to code with codable children: Secondary | ICD-10-CM

## 2015-11-01 DIAGNOSIS — E118 Type 2 diabetes mellitus with unspecified complications: Principal | ICD-10-CM

## 2015-11-01 DIAGNOSIS — E1165 Type 2 diabetes mellitus with hyperglycemia: Secondary | ICD-10-CM

## 2015-11-01 DIAGNOSIS — I10 Essential (primary) hypertension: Secondary | ICD-10-CM

## 2015-11-01 DIAGNOSIS — E785 Hyperlipidemia, unspecified: Secondary | ICD-10-CM

## 2015-11-01 DIAGNOSIS — Z794 Long term (current) use of insulin: Principal | ICD-10-CM

## 2015-11-01 NOTE — Telephone Encounter (Signed)
Called and left message for patient notifying that he needs an appointment

## 2015-11-01 NOTE — Telephone Encounter (Signed)
Discussed with pt. Pt does see Dr. Dorris Fetch. Pt advised to call his office and request refill.

## 2015-11-01 NOTE — Telephone Encounter (Signed)
Patient aware and Dr. To order appropriate labs

## 2015-11-01 NOTE — Telephone Encounter (Signed)
pt needs refill on insulin

## 2015-11-01 NOTE — Telephone Encounter (Signed)
LMRC

## 2015-11-05 LAB — LIPID PANEL
CHOL/HDL RATIO: 2.4 ratio (ref <=5.0)
Cholesterol: 151 mg/dL (ref 125–200)
HDL: 62 mg/dL (ref >=40)
LDL CALC: 77 mg/dL (ref <130)
Triglycerides: 60 mg/dL (ref <150)
VLDL: 12 mg/dL (ref <30)

## 2015-11-05 LAB — HEMOGLOBIN A1C
Hgb A1c MFr Bld: 16.8 % — ABNORMAL HIGH (ref <5.7)
Mean Plasma Glucose: 435 mg/dL

## 2015-11-05 LAB — BASIC METABOLIC PANEL
BUN: 19 mg/dL (ref 7–25)
CHLORIDE: 101 mmol/L (ref 98–110)
CO2: 26 mmol/L (ref 20–31)
Calcium: 9.1 mg/dL (ref 8.6–10.3)
Creat: 1.25 mg/dL (ref 0.60–1.35)
Glucose, Bld: 361 mg/dL — ABNORMAL HIGH (ref 65–99)
POTASSIUM: 5.4 mmol/L — AB (ref 3.5–5.3)
Sodium: 136 mmol/L (ref 135–146)

## 2015-11-06 LAB — MICROALBUMIN / CREATININE URINE RATIO
Creatinine, Urine: 64 mg/dL (ref 20–370)
MICROALB/CREAT RATIO: 383 ug/mg{creat} — AB (ref <30)
Microalb, Ur: 24.5 mg/dL

## 2015-11-13 ENCOUNTER — Telehealth: Payer: Self-pay

## 2015-11-13 NOTE — Telephone Encounter (Signed)
Pt is asking if we can refill his insulin. He did labs. Results are back. He has an appt for 11-22-15. Last appt 06-12-14

## 2015-11-14 NOTE — Telephone Encounter (Signed)
We cannot refill until he shows up for a visit.

## 2015-11-22 ENCOUNTER — Ambulatory Visit (INDEPENDENT_AMBULATORY_CARE_PROVIDER_SITE_OTHER): Payer: BLUE CROSS/BLUE SHIELD | Admitting: "Endocrinology

## 2015-11-22 ENCOUNTER — Encounter: Payer: Self-pay | Admitting: "Endocrinology

## 2015-11-22 VITALS — BP 126/81 | HR 62 | Ht 70.0 in | Wt 180.0 lb

## 2015-11-22 DIAGNOSIS — Z9119 Patient's noncompliance with other medical treatment and regimen: Secondary | ICD-10-CM | POA: Diagnosis not present

## 2015-11-22 DIAGNOSIS — Z91199 Patient's noncompliance with other medical treatment and regimen due to unspecified reason: Secondary | ICD-10-CM | POA: Insufficient documentation

## 2015-11-22 DIAGNOSIS — E119 Type 2 diabetes mellitus without complications: Secondary | ICD-10-CM | POA: Diagnosis not present

## 2015-11-22 DIAGNOSIS — I10 Essential (primary) hypertension: Secondary | ICD-10-CM

## 2015-11-22 MED ORDER — GLUCOSE BLOOD VI STRP: ORAL_STRIP | Status: DC

## 2015-11-22 MED ORDER — INSULIN GLARGINE 100 UNIT/ML SOLOSTAR PEN: 30.0000 [IU] | PEN_INJECTOR | Freq: Every day | SUBCUTANEOUS | Status: DC

## 2015-11-22 NOTE — Progress Notes (Signed)
Subjective:    Patient ID: Randy Oneal, male    DOB: 13-Mar-1966, PCP Sallee Lange, MD   Past Medical History  Diagnosis Date  . Hypertension   . Diabetes mellitus     Insulin dependent  . Renal insufficiency    Past Surgical History  Procedure Laterality Date  . Colonoscopy  06/24/2011    Procedure: COLONOSCOPY;  Surgeon: [name], MD;  Location: [facility];  Service: Endoscopy;  Laterality: N/A;  8:30 AM   Social History   Social History  . Marital Status: Married    Spouse Name: N/A  . Number of Children: N/A  . Years of Education: N/A   Social History Main Topics  . Smoking status: Never Smoker   . Smokeless tobacco: None  . Alcohol Use: No  . Drug Use: No  . Sexual Activity: Not Asked   Other Topics Concern  . None   Social History Narrative   Outpatient Encounter Prescriptions as of 11/22/2015  Medication Sig  . amLODipine (NORVASC) 10 MG tablet TAKE ONE TABLET BY MOUTH ONCE DAILY.  Marland Kitchen Insulin Glargine (LANTUS SOLOSTAR) 100 UNIT/ML Solostar Pen Inject 30 Units into the skin at bedtime.  Marland Kitchen lisinopril (PRINIVIL,ZESTRIL) 20 MG tablet TAKE (1) TABLET BY MOUTH ONCE DAILY FOR BLOOD PRESSURE.  Marland Kitchen NOVOFINE 32G X 6 MM MISC USE AS DIRECTED WITH INSULIN PEN FOUR TIMES DAILY.  . sildenafil (REVATIO) 20 MG tablet May take up to 5 before relations as directed  . [DISCONTINUED] insulin aspart (NOVOLOG FLEXPEN) 100 UNIT/ML FlexPen Inject 5 Units into the skin 3 (three) times daily with meals.  . [DISCONTINUED] Insulin Glargine (LANTUS SOLOSTAR) 100 UNIT/ML Solostar Pen Inject 30 Units into the skin at bedtime.   No facility-administered encounter medications on file as of 11/22/2015.   ALLERGIES: No Known Allergies VACCINATION STATUS: Immunization History  Administered Date(s) Administered  . Pneumococcal Polysaccharide-23 01/30/2014    Diabetes He presents for his follow-up (He was last seen in November 2015, unfortunately he no showed for so long.) diabetic  visit. He has type 2 diabetes mellitus. Onset time: He was diagnosed at approximate age of 38 years. His disease course has been worsening. There are no hypoglycemic associated symptoms. Pertinent negatives for hypoglycemia include no confusion, headaches, pallor or seizures. Associated symptoms include blurred vision, fatigue, polydipsia and polyuria. Pertinent negatives for diabetes include no chest pain, no polyphagia and no weakness. There are no hypoglycemic complications. Symptoms are worsening. There are no diabetic complications. Risk factors for coronary artery disease include diabetes mellitus, hypertension, male sex and sedentary lifestyle. He is compliant with treatment none of the time (He was put on basal/bolus insulin during his prior visits. He comes is no meter nor logs. He is A1c is elevated at 16.8%.). His weight is stable. He is following a generally unhealthy diet. When asked about meal planning, he reported none. Prior visit with dietitian: He missed his visits with the dietitian also. He never participates in exercise. An ACE inhibitor/angiotensin II receptor blocker is being taken. Eye exam is current.  Hypertension This is a chronic problem. The current episode started more than 1 year ago. The problem is controlled. Associated symptoms include blurred vision. Pertinent negatives include no chest pain, headaches, neck pain, palpitations or shortness of breath. Risk factors for coronary artery disease include diabetes mellitus, sedentary lifestyle and male gender. Past treatments include ACE inhibitors.     Review of Systems  Constitutional: Positive for fatigue. Negative for fever, chills  and unexpected weight change.  HENT: Negative for dental problem, mouth sores and trouble swallowing.   Eyes: Positive for blurred vision. Negative for visual disturbance.  Respiratory: Negative for cough, choking, chest tightness, shortness of breath and wheezing.   Cardiovascular: Negative for  chest pain, palpitations and leg swelling.  Gastrointestinal: Negative for nausea, vomiting, abdominal pain, diarrhea, constipation and abdominal distention.  Endocrine: Positive for polydipsia and polyuria. Negative for polyphagia.  Genitourinary: Negative for dysuria, urgency, hematuria and flank pain.  Musculoskeletal: Negative for myalgias, back pain, gait problem and neck pain.  Skin: Negative for pallor, rash and wound.  Neurological: Negative for seizures, syncope, weakness, numbness and headaches.  Psychiatric/Behavioral: Negative.  Negative for confusion and dysphoric mood.    Objective:    BP 126/81 mmHg  Pulse 62  Ht 5\' 10"  (1.778 m)  Wt 180 lb (81.647 kg)  BMI 25.83 kg/m2  SpO2 99%  Wt Readings from Last 3 Encounters:  11/22/15 180 lb (81.647 kg)  04/25/14 170 lb (77.111 kg)  01/30/14 168 lb (76.204 kg)    Physical Exam  Constitutional: He is oriented to person, place, and time. He appears well-developed and well-nourished. He is cooperative. No distress.  HENT:  Head: Normocephalic and atraumatic.  Eyes: EOM are normal.  Neck: Normal range of motion. Neck supple. No tracheal deviation present. No thyromegaly present.  Cardiovascular: Normal rate, S1 normal, S2 normal and normal heart sounds.  Exam reveals no gallop.   No murmur heard. Pulses:      Dorsalis pedis pulses are 1+ on the right side, and 1+ on the left side.       Posterior tibial pulses are 1+ on the right side, and 1+ on the left side.  Pulmonary/Chest: Breath sounds normal. No respiratory distress. He has no wheezes.  Abdominal: Soft. Bowel sounds are normal. He exhibits no distension. There is no tenderness. There is no guarding and no CVA tenderness.  Musculoskeletal: He exhibits no edema.       Right shoulder: He exhibits no swelling and no deformity.  Neurological: He is alert and oriented to person, place, and time. He has normal strength and normal reflexes. No cranial nerve deficit or sensory  deficit. Gait normal.  Skin: Skin is warm and dry. No rash noted. No cyanosis. Nails show no clubbing.  Psychiatric: His speech is normal. Cognition and memory are normal.  He is alarmingly noncompliant, and does have a reluctant affect. He minimizes his body symptoms including polyuria , nocturia which are likely secondary to severe hyperglycemia.      CMP ( most recent) CMP     Component Value Date/Time   NA 136 11/05/2015 1119   K 5.4* 11/05/2015 1119   CL 101 11/05/2015 1119   CO2 26 11/05/2015 1119   GLUCOSE 361* 11/05/2015 1119   BUN 19 11/05/2015 1119   CREATININE 1.25 11/05/2015 1119   CREATININE 1.16 04/25/2014 1304   CALCIUM 9.1 11/05/2015 1119   GFRNONAA 73* 04/25/2014 1304   GFRAA 84* 04/25/2014 1304     Diabetic Labs (most recent): Lab Results  Component Value Date   HGBA1C 16.8* 11/05/2015     Lipid Panel ( most recent) Lipid Panel     Component Value Date/Time   CHOL 151 11/05/2015 1122   TRIG 60 11/05/2015 1122   HDL 62 11/05/2015 1122   CHOLHDL 2.4 11/05/2015 1122   VLDL 12 11/05/2015 1122   LDLCALC 77 11/05/2015 1122  Assessment & Plan:   1. 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 These are all discussed in detail with the patient.  Patient came with severely elevated A1c of 16.8%, with no meter nor glucose profile. He missed his clinic appointments since November of  2015 Recent labs reviewed.   - I have re-counseled the patient on diet management   by adopting a carbohydrate restricted / protein rich  Diet.  - Suggestion is made for patient to avoid simple carbohydrates   from their diet including Cakes , Desserts, Ice Cream,  Soda (  diet and  regular) , Sweet Tea , Candies,  Chips, Cookies, Artificial Sweeteners,   and "Sugar-free" Products .  This will help patient to have stable blood glucose profile and potentially avoid unintended  Weight gain.  - Patient is advised to stick to a routine mealtimes to eat 3 meals  a day and avoid unnecessary snacks ( to snack only to correct hypoglycemia).  - The patient  has been  scheduled with Jearld Fenton, RDN, CDE for individualized DM education.  - I have approached patient with the following individualized plan to manage diabetes and patient agrees.  - Based on his presentation with severely elevated A1c, he would definitely need basal/bolus insulin. -Unfortunately he does not display appropriate commitment to initiate intensive treatment today. - I will proceed with basal insulin Lantus 30 units QHS, associated with strict monitoring of glucose  AC and HS. -Based on his commitment, he would be reapproached for basal/bolus insulin next visit or will be offered premixed insulin such as NovoLog 70/30 or Humalog 75/25 to use twice a day for simplicity reasons. - Patient is warned not to take insulin without proper monitoring per orders.  -Patient is encouraged to call clinic for blood glucose levels less than 70 or above 300 mg /dl. - He does not display atypical type 2 diabetes features. 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 - He is not a good candidate for incretin therapy nor SGLT2 inhibitors. - Patient specific target  for A1c; LDL, HDL, Triglycerides, and  Waist Circumference were discussed in detail.  2) BP/HTN: Controlled. Continue current medications including ACEI/ARB. 3) Lipids/HPL:  Controlled , continue statins. 4)  Weight/Diet: CDE consult in progress, exercise, and carbohydrates information provided. 5) Personal history of noncompliance with medical treatment, presenting hazards to health - I spent 20 minutes counseling this percent for better engagement to treat his  diabetes.  5) Chronic Care/Health Maintenance:  -Patient is on ACEI/ARB and encouraged to continue to follow up with Ophthalmology, Podiatrist at least yearly or according to recommendations, and advised to  stay away from smoking. I have recommended yearly flu vaccine and pneumonia vaccination at least every 5 years; moderate intensity exercise for up to 150 minutes weekly; and  sleep for at least 7 hours a day.   - I advised patient to maintain close follow up with Sallee Lange, MD for primary care needs.  Patient is asked to bring meter and  blood glucose logs during their next visit.   Follow up plan: -Return in about 1 week (around 11/29/2015) for diabetes, high blood pressure, high cholesterol, follow up with meter and logs- no labs.  Heriberto Antigua  Dorris Fetch, MD Phone: [phone]  Fax: [phone]   11/22/2015, 10:29 AM

## 2015-12-05 ENCOUNTER — Ambulatory Visit: Payer: No Typology Code available for payment source | Admitting: "Endocrinology

## 2015-12-07 ENCOUNTER — Ambulatory Visit (INDEPENDENT_AMBULATORY_CARE_PROVIDER_SITE_OTHER): Payer: BLUE CROSS/BLUE SHIELD | Admitting: "Endocrinology

## 2015-12-07 ENCOUNTER — Encounter: Payer: Self-pay | Admitting: "Endocrinology

## 2015-12-07 VITALS — BP 135/91 | HR 71 | Ht 70.0 in | Wt 182.0 lb

## 2015-12-07 DIAGNOSIS — Z9119 Patient's noncompliance with other medical treatment and regimen: Secondary | ICD-10-CM | POA: Diagnosis not present

## 2015-12-07 DIAGNOSIS — E119 Type 2 diabetes mellitus without complications: Secondary | ICD-10-CM | POA: Diagnosis not present

## 2015-12-07 DIAGNOSIS — Z91199 Patient's noncompliance with other medical treatment and regimen due to unspecified reason: Secondary | ICD-10-CM

## 2015-12-07 DIAGNOSIS — I10 Essential (primary) hypertension: Secondary | ICD-10-CM

## 2015-12-07 MED ORDER — INSULIN PEN NEEDLE 31G X 8 MM MISC
1.0000 | Status: DC
Start: 2015-12-07 — End: 2018-10-20

## 2015-12-07 MED ORDER — INSULIN ASPART 100 UNIT/ML FLEXPEN: 5.0000 [IU] | PEN_INJECTOR | Freq: Three times a day (TID) | SUBCUTANEOUS | Status: DC

## 2015-12-07 NOTE — Progress Notes (Signed)
Subjective:    Patient ID: Randy Oneal, male    DOB: 27-Aug-1965, PCP Sallee Lange, MD   Past Medical History  Diagnosis Date  . Hypertension   . Diabetes mellitus     Insulin dependent  . Renal insufficiency    Past Surgical History  Procedure Laterality Date  . Colonoscopy  06/24/2011    Procedure: COLONOSCOPY;  Surgeon: [name], MD;  Location: [facility];  Service: Endoscopy;  Laterality: N/A;  8:30 AM   Social History   Social History  . Marital Status: Married    Spouse Name: N/A  . Number of Children: N/A  . Years of Education: N/A   Social History Main Topics  . Smoking status: Never Smoker   . Smokeless tobacco: None  . Alcohol Use: No  . Drug Use: No  . Sexual Activity: Not Asked   Other Topics Concern  . None   Social History Narrative   Outpatient Encounter Prescriptions as of 12/07/2015  Medication Sig  . amLODipine (NORVASC) 10 MG tablet TAKE ONE TABLET BY MOUTH ONCE DAILY.  Marland Kitchen glucose blood test strip Use as instructed 4 x daily. E11.65  . insulin aspart (NOVOLOG FLEXPEN) 100 UNIT/ML FlexPen Inject 5-11 Units into the skin 3 (three) times daily with meals.  . Insulin Glargine (LANTUS SOLOSTAR) 100 UNIT/ML Solostar Pen Inject 30 Units into the skin at bedtime.  . Insulin Pen Needle (B-D ULTRAFINE III SHORT PEN) 31G X 8 MM MISC 1 each by Does not apply route as directed.  Marland Kitchen lisinopril (PRINIVIL,ZESTRIL) 20 MG tablet TAKE (1) TABLET BY MOUTH ONCE DAILY FOR BLOOD PRESSURE.  Marland Kitchen NOVOFINE 32G X 6 MM MISC USE AS DIRECTED WITH INSULIN PEN FOUR TIMES DAILY.  . sildenafil (REVATIO) 20 MG tablet May take up to 5 before relations as directed   No facility-administered encounter medications on file as of 12/07/2015.   ALLERGIES: No Known Allergies VACCINATION STATUS: Immunization History  Administered Date(s) Administered  . Pneumococcal Polysaccharide-23 01/30/2014    Diabetes He presents for his follow-up (He was last seen in November 2015,  unfortunately he no showed for so long.) diabetic visit. He has type 2 diabetes mellitus. Onset time: He was diagnosed at approximate age of 58 years. His disease course has been worsening. There are no hypoglycemic associated symptoms. Pertinent negatives for hypoglycemia include no confusion, headaches, pallor or seizures. Associated symptoms include blurred vision, fatigue, polydipsia and polyuria. Pertinent negatives for diabetes include no chest pain, no polyphagia and no weakness. There are no hypoglycemic complications. Symptoms are worsening. There are no diabetic complications. Risk factors for coronary artery disease include diabetes mellitus, hypertension, male sex and sedentary lifestyle. He is compliant with treatment none of the time (He came with a log showing persistently elevated glycemic profile and his recent A1c was 16.8%.). His weight is increasing steadily (He has gained 12 pounds since last visit.). He is following a generally unhealthy diet. When asked about meal planning, he reported none. Prior visit with dietitian: He missed his visits with the dietitian also. He never participates in exercise. His breakfast blood glucose range is generally >200 mg/dl. His lunch blood glucose range is generally >200 mg/dl. His dinner blood glucose range is generally >200 mg/dl. His overall blood glucose range is >200 mg/dl. An ACE inhibitor/angiotensin II receptor blocker is being taken. Eye exam is current.  Hypertension This is a chronic problem. The current episode started more than 1 year ago. The problem is controlled. Associated  symptoms include blurred vision. Pertinent negatives include no chest pain, headaches, neck pain, palpitations or shortness of breath. Risk factors for coronary artery disease include diabetes mellitus, sedentary lifestyle and male gender. Past treatments include ACE inhibitors.     Review of Systems  Constitutional: Positive for fatigue. Negative for fever, chills and  unexpected weight change.  HENT: Negative for dental problem, mouth sores and trouble swallowing.   Eyes: Positive for blurred vision. Negative for visual disturbance.  Respiratory: Negative for cough, choking, chest tightness, shortness of breath and wheezing.   Cardiovascular: Negative for chest pain, palpitations and leg swelling.  Gastrointestinal: Negative for nausea, vomiting, abdominal pain, diarrhea, constipation and abdominal distention.  Endocrine: Positive for polydipsia and polyuria. Negative for polyphagia.  Genitourinary: Negative for dysuria, urgency, hematuria and flank pain.  Musculoskeletal: Negative for myalgias, back pain, gait problem and neck pain.  Skin: Negative for pallor, rash and wound.  Neurological: Negative for seizures, syncope, weakness, numbness and headaches.  Psychiatric/Behavioral: Negative.  Negative for confusion and dysphoric mood.    Objective:    BP 135/91 mmHg  Pulse 71  Ht 5\' 10"  (1.778 m)  Wt 182 lb (82.555 kg)  BMI 26.11 kg/m2  SpO2 98%  Wt Readings from Last 3 Encounters:  12/07/15 182 lb (82.555 kg)  11/22/15 180 lb (81.647 kg)  04/25/14 170 lb (77.111 kg)    Physical Exam  Constitutional: He is oriented to person, place, and time. He appears well-developed and well-nourished. He is cooperative. No distress.  HENT:  Head: Normocephalic and atraumatic.  Eyes: EOM are normal.  Neck: Normal range of motion. Neck supple. No tracheal deviation present. No thyromegaly present.  Cardiovascular: Normal rate, S1 normal, S2 normal and normal heart sounds.  Exam reveals no gallop.   No murmur heard. Pulses:      Dorsalis pedis pulses are 1+ on the right side, and 1+ on the left side.       Posterior tibial pulses are 1+ on the right side, and 1+ on the left side.  Pulmonary/Chest: Breath sounds normal. No respiratory distress. He has no wheezes.  Abdominal: Soft. Bowel sounds are normal. He exhibits no distension. There is no tenderness.  There is no guarding and no CVA tenderness.  Musculoskeletal: He exhibits no edema.       Right shoulder: He exhibits no swelling and no deformity.  Neurological: He is alert and oriented to person, place, and time. He has normal strength and normal reflexes. No cranial nerve deficit or sensory deficit. Gait normal.  Skin: Skin is warm and dry. No rash noted. No cyanosis. Nails show no clubbing.  Psychiatric: His speech is normal. Cognition and memory are normal.  He is alarmingly noncompliant, and does have a reluctant affect. He minimizes his body symptoms including polyuria , nocturia which are likely secondary to severe hyperglycemia.      CMP ( most recent) CMP     Component Value Date/Time   NA 136 11/05/2015 1119   K 5.4* 11/05/2015 1119   CL 101 11/05/2015 1119   CO2 26 11/05/2015 1119   GLUCOSE 361* 11/05/2015 1119   BUN 19 11/05/2015 1119   CREATININE 1.25 11/05/2015 1119   CREATININE 1.16 04/25/2014 1304   CALCIUM 9.1 11/05/2015 1119   GFRNONAA 73* 04/25/2014 1304   GFRAA 84* 04/25/2014 1304     Diabetic Labs (most recent): Lab Results  Component Value Date   HGBA1C 16.8* 11/05/2015     Lipid Panel ( most recent)  Lipid Panel     Component Value Date/Time   CHOL 151 11/05/2015 1122   TRIG 60 11/05/2015 1122   HDL 62 11/05/2015 1122   CHOLHDL 2.4 11/05/2015 1122   VLDL 12 11/05/2015 1122   LDLCALC 77 11/05/2015 1122      Assessment & Plan:   1. 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 These are all discussed in detail with the patient.  Patient came with severely elevated A1c of 16.8%, with no meter nor glucose profile. He missed his clinic appointments since November  of  2015 Recent labs reviewed.   - I have re-counseled the patient on diet management   by adopting a carbohydrate restricted / protein rich  Diet.  - Suggestion is made for patient to avoid simple carbohydrates   from their diet including Cakes , Desserts, Ice Cream,  Soda (  diet and regular) , Sweet Tea , Candies,  Chips, Cookies, Artificial Sweeteners,   and "Sugar-free" Products .  This will help patient to have stable blood glucose profile and potentially avoid unintended  Weight gain.  - Patient is advised to stick to a routine mealtimes to eat 3 meals  a day and avoid unnecessary snacks ( to snack only to correct hypoglycemia).  - The patient  has been  scheduled with Jearld Fenton, RDN, CDE for individualized DM education.  - I have approached patient with the following individualized plan to manage diabetes and patient agrees.  - Based on his presentation with severely elevated A1c, he will need basal/bolus insulin.  - I will proceed with basal insulin Lantus 30 units QHS, initiate NovoLog 5 units 3 times a day before meals for pre-meal blood glucose above 90 mg/dL plus correction, associated with strict monitoring of glucose  AC and HS. -If he cannot execute this regimen, he will be  offered premixed insulin such as NovoLog 70/30 or Humalog 75/25 to use twice a day for simplicity reasons. - Patient is warned not to take insulin without proper monitoring per orders.  -Patient is encouraged to call clinic for blood glucose levels less than 70 or above 300 mg /dl. - He does not display atypical type 2 diabetes features. 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 - He is not a good candidate for incretin therapy nor SGLT2 inhibitors. - Patient specific target  for A1c; LDL, HDL, Triglycerides, and  Waist Circumference were discussed in detail.  2) BP/HTN: Controlled. Continue current medications including ACEI/ARB. 3) Lipids/HPL:  Controlled , continue statins. 4)  Weight/Diet: CDE  consult in progress, exercise, and carbohydrates information provided. 5) Personal history of noncompliance with medical treatment, presenting hazards to health - I spent 20 minutes counseling this percent for better engagement to treat his diabetes.  5) Chronic Care/Health Maintenance:  -Patient is on ACEI/ARB and encouraged to continue to follow up with Ophthalmology, Podiatrist at least yearly or according to recommendations, and advised to  stay away from smoking. I have recommended yearly flu vaccine and pneumonia vaccination at least every 5 years; moderate intensity exercise for up to 150 minutes weekly; and  sleep for at least 7 hours a day.   - I advised patient to maintain close follow up with Nicki Reaper  Wolfgang Phoenix, MD for primary care needs.  Patient is asked to bring meter and  blood glucose logs during their next visit.   Follow up plan: -Return in about 4 weeks (around 01/04/2016) for diabetes, high blood pressure, high cholesterol, follow up with meter and logs- no labs.  Glade Lloyd, MD Phone: [phone]  Fax: [phone]   12/07/2015, 10:07 AM

## 2015-12-07 NOTE — Patient Instructions (Signed)

## 2016-01-04 ENCOUNTER — Encounter: Payer: Self-pay | Admitting: "Endocrinology

## 2016-01-04 ENCOUNTER — Ambulatory Visit (INDEPENDENT_AMBULATORY_CARE_PROVIDER_SITE_OTHER): Payer: BLUE CROSS/BLUE SHIELD | Admitting: "Endocrinology

## 2016-01-04 VITALS — BP 132/90 | HR 84 | Resp 18 | Ht 70.0 in | Wt 200.0 lb

## 2016-01-04 DIAGNOSIS — Z9119 Patient's noncompliance with other medical treatment and regimen: Secondary | ICD-10-CM | POA: Diagnosis not present

## 2016-01-04 DIAGNOSIS — I10 Essential (primary) hypertension: Secondary | ICD-10-CM

## 2016-01-04 DIAGNOSIS — Z91199 Patient's noncompliance with other medical treatment and regimen due to unspecified reason: Secondary | ICD-10-CM

## 2016-01-04 DIAGNOSIS — E119 Type 2 diabetes mellitus without complications: Secondary | ICD-10-CM | POA: Diagnosis not present

## 2016-01-04 NOTE — Progress Notes (Signed)
Subjective:    Patient ID: Randy Oneal, male    DOB: 10/18/1965, PCP Sallee Lange, MD   Past Medical History  Diagnosis Date  . Hypertension   . Diabetes mellitus     Insulin dependent  . Renal insufficiency    Past Surgical History  Procedure Laterality Date  . Colonoscopy  06/24/2011    Procedure: COLONOSCOPY;  Surgeon: [name], MD;  Location: [facility];  Service: Endoscopy;  Laterality: N/A;  8:30 AM   Social History   Social History  . Marital Status: Married    Spouse Name: N/A  . Number of Children: N/A  . Years of Education: N/A   Social History Main Topics  . Smoking status: Never Smoker   . Smokeless tobacco: None  . Alcohol Use: No  . Drug Use: No  . Sexual Activity: Not Asked   Other Topics Concern  . None   Social History Narrative   Outpatient Encounter Prescriptions as of 01/04/2016  Medication Sig  . amLODipine (NORVASC) 10 MG tablet TAKE ONE TABLET BY MOUTH ONCE DAILY.  Marland Kitchen glucose blood test strip Use as instructed 4 x daily. E11.65  . insulin aspart (NOVOLOG FLEXPEN) 100 UNIT/ML FlexPen Inject 5-11 Units into the skin 3 (three) times daily with meals.  . Insulin Glargine (LANTUS SOLOSTAR) 100 UNIT/ML Solostar Pen Inject 30 Units into the skin at bedtime.  . Insulin Pen Needle (B-D ULTRAFINE III SHORT PEN) 31G X 8 MM MISC 1 each by Does not apply route as directed.  Marland Kitchen lisinopril (PRINIVIL,ZESTRIL) 20 MG tablet TAKE (1) TABLET BY MOUTH ONCE DAILY FOR BLOOD PRESSURE.  Marland Kitchen NOVOFINE 32G X 6 MM MISC USE AS DIRECTED WITH INSULIN PEN FOUR TIMES DAILY.  . sildenafil (REVATIO) 20 MG tablet May take up to 5 before relations as directed   No facility-administered encounter medications on file as of 01/04/2016.   ALLERGIES: No Known Allergies VACCINATION STATUS: Immunization History  Administered Date(s) Administered  . Pneumococcal Polysaccharide-23 01/30/2014    Diabetes He presents for his follow-up (He was last seen in November 2015,  unfortunately he no showed for so long.) diabetic visit. He has type 2 diabetes mellitus. Onset time: He was diagnosed at approximate age of 28 years. His disease course has been worsening. There are no hypoglycemic associated symptoms. Pertinent negatives for hypoglycemia include no confusion, headaches, pallor or seizures. Associated symptoms include blurred vision, fatigue, polydipsia and polyuria. Pertinent negatives for diabetes include no chest pain, no polyphagia and no weakness. There are no hypoglycemic complications. Symptoms are worsening. There are no diabetic complications. Risk factors for coronary artery disease include diabetes mellitus, hypertension, male sex and sedentary lifestyle. He is compliant with treatment none of the time (He came with a log showing persistently elevated glycemic profile and his recent A1c was 16.8%.). His weight is increasing steadily (He has gained 12 pounds since last visit.). He is following a generally unhealthy diet. When asked about meal planning, he reported none. Prior visit with dietitian: He missed his visits with the dietitian also. He never participates in exercise. His breakfast blood glucose range is generally >200 mg/dl. His lunch blood glucose range is generally >200 mg/dl. His dinner blood glucose range is generally >200 mg/dl. His overall blood glucose range is >200 mg/dl. An ACE inhibitor/angiotensin II receptor blocker is being taken. Eye exam is current.  Hypertension This is a chronic problem. The current episode started more than 1 year ago. The problem is controlled. Associated  symptoms include blurred vision. Pertinent negatives include no chest pain, headaches, neck pain, palpitations or shortness of breath. Risk factors for coronary artery disease include diabetes mellitus, sedentary lifestyle and male gender. Past treatments include ACE inhibitors.     Review of Systems  Constitutional: Positive for fatigue. Negative for fever, chills and  unexpected weight change.  HENT: Negative for dental problem, mouth sores and trouble swallowing.   Eyes: Positive for blurred vision. Negative for visual disturbance.  Respiratory: Negative for cough, choking, chest tightness, shortness of breath and wheezing.   Cardiovascular: Negative for chest pain, palpitations and leg swelling.  Gastrointestinal: Negative for nausea, vomiting, abdominal pain, diarrhea, constipation and abdominal distention.  Endocrine: Positive for polydipsia and polyuria. Negative for polyphagia.  Genitourinary: Negative for dysuria, urgency, hematuria and flank pain.  Musculoskeletal: Negative for myalgias, back pain, gait problem and neck pain.  Skin: Negative for pallor, rash and wound.  Neurological: Negative for seizures, syncope, weakness, numbness and headaches.  Psychiatric/Behavioral: Negative.  Negative for confusion and dysphoric mood.    Objective:    BP 132/90 mmHg  Pulse 84  Resp 18  Ht 5\' 10"  (1.778 m)  Wt 200 lb (90.719 kg)  BMI 28.70 kg/m2  SpO2 100%  Wt Readings from Last 3 Encounters:  01/04/16 200 lb (90.719 kg)  12/07/15 182 lb (82.555 kg)  11/22/15 180 lb (81.647 kg)    Physical Exam  Constitutional: He is oriented to person, place, and time. He appears well-developed and well-nourished. He is cooperative. No distress.  HENT:  Head: Normocephalic and atraumatic.  Eyes: EOM are normal.  Neck: Normal range of motion. Neck supple. No tracheal deviation present. No thyromegaly present.  Cardiovascular: Normal rate, S1 normal, S2 normal and normal heart sounds.  Exam reveals no gallop.   No murmur heard. Pulses:      Dorsalis pedis pulses are 1+ on the right side, and 1+ on the left side.       Posterior tibial pulses are 1+ on the right side, and 1+ on the left side.  Pulmonary/Chest: Breath sounds normal. No respiratory distress. He has no wheezes.  Abdominal: Soft. Bowel sounds are normal. He exhibits no distension. There is no  tenderness. There is no guarding and no CVA tenderness.  Musculoskeletal: He exhibits no edema.       Right shoulder: He exhibits no swelling and no deformity.  Neurological: He is alert and oriented to person, place, and time. He has normal strength and normal reflexes. No cranial nerve deficit or sensory deficit. Gait normal.  Skin: Skin is warm and dry. No rash noted. No cyanosis. Nails show no clubbing.  Psychiatric: His speech is normal. Cognition and memory are normal.  He has hx of alarming noncompliance, and does have a reluctant affect. He minimizes his body symptoms including polyuria , nocturia which are likely secondary to severe hyperglycemia.    CMP     Component Value Date/Time   NA 136 11/05/2015 1119   K 5.4* 11/05/2015 1119   CL 101 11/05/2015 1119   CO2 26 11/05/2015 1119   GLUCOSE 361* 11/05/2015 1119   BUN 19 11/05/2015 1119   CREATININE 1.25 11/05/2015 1119   CREATININE 1.16 04/25/2014 1304   CALCIUM 9.1 11/05/2015 1119   GFRNONAA 73* 04/25/2014 1304   GFRAA 84* 04/25/2014 1304     Diabetic Labs (most recent): Lab Results  Component Value Date   HGBA1C 16.8* 11/05/2015     Lipid Panel ( most recent) Lipid  Panel     Component Value Date/Time   CHOL 151 11/05/2015 1122   TRIG 60 11/05/2015 1122   HDL 62 11/05/2015 1122   CHOLHDL 2.4 11/05/2015 1122   VLDL 12 11/05/2015 1122   LDLCALC 77 11/05/2015 1122      Assessment & Plan:   1. Type 2 diabetes mellitus not at goal [facility])  - his diabetes is  complicated by non-compliance and now blurry vision which is likely secondary to ophthalmic complication from his diabetes, patient remains at a high risk for more acute and chronic complications of diabetes which include CAD, CVA, CKD, retinopathy, and neuropathy. These are all discussed in detail with the patient.  Patient came with Dramatic improvement in his blood glucose profile since he was initiated on basal/bolus insulin. His most recent A1c was  extremely high at 16.8%.  -He has a habit of disappearing from care and noncompliance. - I have re-counseled the patient  to stay engaged in intensive insulin management of his diabetes along with diet management   by adopting a carbohydrate restricted / protein rich  Diet.  - Suggestion is made for patient to avoid simple carbohydrates   from their diet including Cakes , Desserts, Ice Cream,  Soda (  diet and regular) , Sweet Tea , Candies,  Chips, Cookies, Artificial Sweeteners,   and "Sugar-free" Products .  This will help patient to have stable blood glucose profile and potentially avoid unintended  Weight gain.  - Patient is advised to stick to a routine mealtimes to eat 3 meals  a day and avoid unnecessary snacks ( to snack only to correct hypoglycemia).  - The patient  has been  scheduled with Jearld Fenton, RDN, CDE for individualized DM education.  - I have approached patient with the following individualized plan to manage diabetes and patient agrees.  - I will proceed with basal insulin Lantus 30 units QHS, NovoLog 5 units 3 times a day before meals for pre-meal blood glucose above 90 mg/dL plus correction, associated with strict monitoring of glucose  AC and HS. -If he cannot execute this regimen, he will be  offered premixed insulin such as NovoLog 70/30 or Humalog 75/25 to use twice a day for simplicity reasons. - Patient is warned not to take insulin without proper monitoring per orders.  -Patient is encouraged to call clinic for blood glucose levels less than 70 or above 300 mg /dl. - He does not display atypical type 2 diabetes features. 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 - He is not a good candidate for incretin therapy nor SGLT2 inhibitors. - Patient specific target  for A1c; LDL, HDL, Triglycerides, and  Waist Circumference were discussed in detail.  2) BP/HTN: Controlled. Continue current medications including ACEI/ARB. 3) Lipids/HPL:  Controlled , continue  statins. 4)  Weight/Diet: CDE consult in progress, exercise, and carbohydrates information provided. 5) Personal history of noncompliance with medical treatment, presenting hazards to health - I spent 20 minutes counseling this percent for better engagement to treat his diabetes.  5) Chronic Care/Health Maintenance:  -Patient is on ACEI/ARB and encouraged to continue to follow up with Ophthalmology, Podiatrist at least yearly or according to recommendations, and advised to  stay away from smoking. I have recommended yearly flu vaccine and pneumonia vaccination at least every 5 years; moderate intensity exercise for up to 150 minutes weekly; and  sleep for at least 7 hours a day.   - I advised patient to maintain close follow  up with Sallee Lange, MD for primary care needs.  Patient is asked to bring meter and  blood glucose logs during their next visit.   Follow up plan: -Return in about 6 weeks (around 02/15/2016) for diabetes, high blood pressure, follow up with pre-visit labs, meter, and logs.  Glade Lloyd, MD Phone: [phone]  Fax: [phone]   01/04/2016, 9:02 AM

## 2016-01-04 NOTE — Patient Instructions (Signed)

## 2016-02-13 ENCOUNTER — Other Ambulatory Visit: Payer: Self-pay | Admitting: "Endocrinology

## 2016-02-13 ENCOUNTER — Other Ambulatory Visit: Payer: Self-pay | Admitting: Family Medicine

## 2016-02-13 NOTE — Telephone Encounter (Signed)
Patient last seen in 2015.

## 2016-02-14 NOTE — Telephone Encounter (Signed)
Patient needs office visit may have 7 day supply

## 2016-02-18 ENCOUNTER — Ambulatory Visit: Payer: BLUE CROSS/BLUE SHIELD | Admitting: "Endocrinology

## 2016-07-10 ENCOUNTER — Other Ambulatory Visit: Payer: Self-pay | Admitting: Family Medicine

## 2016-07-10 NOTE — Telephone Encounter (Signed)
Wow, this patient needs to be seen may have 14 tablets only

## 2016-08-05 ENCOUNTER — Telehealth: Payer: Self-pay | Admitting: Family Medicine

## 2016-08-05 ENCOUNTER — Other Ambulatory Visit: Payer: Self-pay | Admitting: *Deleted

## 2016-08-05 MED ORDER — LISINOPRIL 20 MG PO TABS: ORAL_TABLET | ORAL | 0 refills | Status: DC

## 2016-08-05 NOTE — Telephone Encounter (Signed)
Last htn check up 2015

## 2016-08-05 NOTE — Telephone Encounter (Signed)
Pt is needing a refill on his blood pressure medication. Pt has an appt scheduled for 08/21/16 at [facility]

## 2016-08-05 NOTE — Telephone Encounter (Signed)
Med sent to pharm. Pt notified on voicemail.  

## 2016-08-05 NOTE — Telephone Encounter (Signed)
Patient may have 30 day supply

## 2016-08-21 ENCOUNTER — Ambulatory Visit (INDEPENDENT_AMBULATORY_CARE_PROVIDER_SITE_OTHER): Payer: BLUE CROSS/BLUE SHIELD | Admitting: Family Medicine

## 2016-08-21 ENCOUNTER — Encounter: Payer: Self-pay | Admitting: Family Medicine

## 2016-08-21 VITALS — BP 148/100 | Ht 70.0 in | Wt 184.0 lb

## 2016-08-21 DIAGNOSIS — I10 Essential (primary) hypertension: Secondary | ICD-10-CM

## 2016-08-21 DIAGNOSIS — Z125 Encounter for screening for malignant neoplasm of prostate: Secondary | ICD-10-CM

## 2016-08-21 DIAGNOSIS — E119 Type 2 diabetes mellitus without complications: Secondary | ICD-10-CM

## 2016-08-21 DIAGNOSIS — Z13220 Encounter for screening for lipoid disorders: Secondary | ICD-10-CM | POA: Diagnosis not present

## 2016-08-21 DIAGNOSIS — Z79899 Other long term (current) drug therapy: Secondary | ICD-10-CM | POA: Diagnosis not present

## 2016-08-21 MED ORDER — AMLODIPINE BESYLATE 10 MG PO TABS: 10.0000 mg | ORAL_TABLET | Freq: Every day | ORAL | 6 refills | Status: DC

## 2016-08-21 MED ORDER — LISINOPRIL 20 MG PO TABS: ORAL_TABLET | ORAL | 6 refills | Status: DC

## 2016-08-21 NOTE — Progress Notes (Signed)
   Subjective:    Patient ID: Randy Oneal, male    DOB: [birth year], 51 y.o.   MRN: [id number]  Hypertension  This is a chronic problem. The current episode started more than 1 year ago. Compliance problems include exercise.   He denies any chest tightness pressure pain shortness of breath stays active with his job but does not do any major physical activity outside of that Sees Dr. Dorris Fetch for diabetes. Does not follow through with Dr. Dorris Fetch on a regular basis. Has been advised to be seen at least every 66 months. Is been quite sometime since she's been there. He states he monitors his sugars and was telling us that they've been okay he does relate some increased urination increase thirst  Wax build up in ears.    Review of Systems Please see above. No cough wheezing fever chills    Objective:   Physical Exam  Patient denies any chest tightness pressure pain shortness of breath denies abdominal pain rectal bleeding hematuria denies joint pains.      Assessment & Plan:  Mild wax buildup recommend for the patient to get wax removal OTC if that doesn't help referral to ENT for this  HTN subpar follow-up not taking medicine currently because he ran out new refills given very important for the patient to do much better with this diet activity and taken his medicine plus follow-up 6 months he voices understanding  Patient with poor follow-up with diabetic doctor lab work ordered previous lab reviewed with the patient  Patient at high risk for heart attack strokes if he does not get his problems under control this was told to the patient.  Patient also to follow-up in 6 months for a wellness plus HTN checkup will need PSA at that time  We will review his lab work in send a copy to Dr. Dorris Fetch patient was encouraged follow-up with Dr. Dorris Fetch within the next 4-6 weeks

## 2016-08-29 ENCOUNTER — Telehealth: Payer: Self-pay | Admitting: Family Medicine

## 2016-08-29 MED ORDER — OSELTAMIVIR PHOSPHATE 75 MG PO CAPS: 75.0000 mg | ORAL_CAPSULE | Freq: Two times a day (BID) | ORAL | 0 refills | Status: AC

## 2016-08-29 NOTE — Telephone Encounter (Signed)
Pt has flu like symptoms and is unable to come be seen and is wanting to know if something can be called in.     [facility]

## 2016-08-29 NOTE — Telephone Encounter (Signed)
Likely flu, willing ti call in flu med since no more pts this eve, tamiflu 75 bid five d, go to er if worsens

## 2016-08-29 NOTE — Telephone Encounter (Signed)
Patient aware rx sent to pharmacy and to seek medical at ED if symptoms worsen.

## 2016-08-29 NOTE — Telephone Encounter (Signed)
Patient's wife states he had fever of 100.7 yesterday, headaches, myalgias, chills, and some coughing.  She states she is unaware of direct influenza exposure.  She is requesting "something" be called into pharmacy. Please advise.

## 2016-11-01 LAB — BASIC METABOLIC PANEL
BUN / CREAT RATIO: 14 (ref 9–20)
BUN: 19 mg/dL (ref 6–24)
CHLORIDE: 98 mmol/L (ref 96–106)
CO2: 25 mmol/L (ref 18–29)
CREATININE: 1.34 mg/dL — AB (ref 0.76–1.27)
Calcium: 9.4 mg/dL (ref 8.7–10.2)
GFR calc Af Amer: 71 mL/min/{1.73_m2} (ref >59)
GFR calc non Af Amer: 61 mL/min/{1.73_m2} (ref >59)
GLUCOSE: 280 mg/dL — AB (ref 65–99)
Potassium: 4.8 mmol/L (ref 3.5–5.2)
Sodium: 135 mmol/L (ref 134–144)

## 2016-11-01 LAB — LIPID PANEL
CHOL/HDL RATIO: 3 ratio (ref 0.0–5.0)
Cholesterol, Total: 201 mg/dL — ABNORMAL HIGH (ref 100–199)
HDL: 66 mg/dL (ref >39)
LDL CALC: 118 mg/dL — AB (ref 0–99)
TRIGLYCERIDES: 84 mg/dL (ref 0–149)
VLDL Cholesterol Cal: 17 mg/dL (ref 5–40)

## 2016-11-01 LAB — HEPATIC FUNCTION PANEL
ALT: 27 IU/L (ref 0–44)
AST: 23 IU/L (ref 0–40)
Albumin: 3.8 g/dL (ref 3.5–5.5)
Alkaline Phosphatase: 78 IU/L (ref 39–117)
BILIRUBIN, DIRECT: 0.13 mg/dL (ref 0.00–0.40)
Bilirubin Total: 0.5 mg/dL (ref 0.0–1.2)
Total Protein: 6.5 g/dL (ref 6.0–8.5)

## 2016-11-01 LAB — MICROALBUMIN / CREATININE URINE RATIO
CREATININE, UR: 189.7 mg/dL
MICROALBUM., U, RANDOM: 1692.2 ug/mL
Microalb/Creat Ratio: 892 mg/g creat — ABNORMAL HIGH (ref 0.0–30.0)

## 2016-11-01 LAB — PSA: Prostate Specific Ag, Serum: 0.3 ng/mL (ref 0.0–4.0)

## 2016-11-01 LAB — HEMOGLOBIN A1C
ESTIMATED AVERAGE GLUCOSE: 378 mg/dL
HEMOGLOBIN A1C: 14.8 % — AB (ref 4.8–5.6)

## 2016-11-18 ENCOUNTER — Other Ambulatory Visit: Payer: Self-pay | Admitting: "Endocrinology

## 2017-02-16 ENCOUNTER — Ambulatory Visit: Payer: BLUE CROSS/BLUE SHIELD | Admitting: Family Medicine

## 2017-03-04 ENCOUNTER — Encounter: Payer: Self-pay | Admitting: Family Medicine

## 2017-03-04 ENCOUNTER — Ambulatory Visit (INDEPENDENT_AMBULATORY_CARE_PROVIDER_SITE_OTHER): Payer: BLUE CROSS/BLUE SHIELD | Admitting: Family Medicine

## 2017-03-04 VITALS — BP 132/82 | Ht 70.0 in | Wt 194.0 lb

## 2017-03-04 DIAGNOSIS — E1169 Type 2 diabetes mellitus with other specified complication: Secondary | ICD-10-CM | POA: Insufficient documentation

## 2017-03-04 DIAGNOSIS — I10 Essential (primary) hypertension: Secondary | ICD-10-CM

## 2017-03-04 DIAGNOSIS — E785 Hyperlipidemia, unspecified: Secondary | ICD-10-CM

## 2017-03-04 DIAGNOSIS — N529 Male erectile dysfunction, unspecified: Secondary | ICD-10-CM

## 2017-03-04 DIAGNOSIS — Z79899 Other long term (current) drug therapy: Secondary | ICD-10-CM | POA: Diagnosis not present

## 2017-03-04 DIAGNOSIS — Z794 Long term (current) use of insulin: Secondary | ICD-10-CM | POA: Diagnosis not present

## 2017-03-04 DIAGNOSIS — E1140 Type 2 diabetes mellitus with diabetic neuropathy, unspecified: Secondary | ICD-10-CM

## 2017-03-04 MED ORDER — AMLODIPINE BESYLATE 10 MG PO TABS: 10.0000 mg | ORAL_TABLET | Freq: Every day | ORAL | 5 refills | Status: DC

## 2017-03-04 MED ORDER — LISINOPRIL 20 MG PO TABS: ORAL_TABLET | ORAL | 5 refills | Status: DC

## 2017-03-04 MED ORDER — SILDENAFIL CITRATE 20 MG PO TABS: ORAL_TABLET | ORAL | 4 refills | Status: DC

## 2017-03-04 MED ORDER — ATORVASTATIN CALCIUM 10 MG PO TABS: 10.0000 mg | ORAL_TABLET | Freq: Every day | ORAL | 5 refills | Status: DC

## 2017-03-04 NOTE — Progress Notes (Signed)
   Subjective:    Patient ID: Randy Oneal, male    DOB: Aug 22, 1965, 51 y.o.   MRN: [id number]  Hypertension  This is a chronic problem. Pertinent negatives include no chest pain. There are no compliance problems (takes meds everyday, moderate exercise, eats healthy).    Left knee pain.  Patient states he was sitting on the floor started to get up they'll strange sensation in his knee had a hard time straightening it walk with a limp for several days then got better. Is not done and again. Seems to be doing better now  Calculus on both great toes. He relates that this is been progressive here remove them then and they will come back he denies any burning in his feet denies any sores or ulcers  Sees Dr. Dorris Fetch for diabetes. He has not seen him in quite some time his A1c on recent lab work very elevated   Review of Systems  Constitutional: Negative for activity change, appetite change and fatigue.  HENT: Negative for congestion.   Respiratory: Negative for cough.   Cardiovascular: Negative for chest pain.  Gastrointestinal: Negative for abdominal pain.  Endocrine: Negative for polydipsia and polyphagia.  Neurological: Negative for weakness.  Psychiatric/Behavioral: Negative for confusion.       Objective:   Physical Exam  Constitutional: He appears well-nourished. No distress.  Cardiovascular: Normal rate, regular rhythm and normal heart sounds.   No murmur heard. Pulmonary/Chest: Effort normal and breath sounds normal. No respiratory distress.  Musculoskeletal: He exhibits no edema.  Lymphadenopathy:    He has no cervical adenopathy.  Neurological: He is alert.  Psychiatric: His behavior is normal.  Vitals reviewed.  Diabetic foot exam has calluses on both great toes has mild neuropathy no ulcers seen   25 minutes was spent with the patient. Greater than half the time was spent in discussion and answering questions and counseling regarding the issues that the patient came in  for today.     Assessment & Plan:  Diabetic foot care discussed with the patient importance of keeping calluses trimmed down but avoid damaging the underlying skin  High blood pressure good control currently refills given follow-up in 6 months  Hyperlipidemia with diabetes labs reviewed start Lipitor 10 mg recheck lab work again in approximately 8-12 weeks if any problems with medication notifies  Diabetes poor control the importance of getting this under control was discussed with the patient he was encouraged to follow-up with diabetic specialist  Erectile dysfunction sildenafil described and and prescribed

## 2017-06-10 ENCOUNTER — Other Ambulatory Visit: Payer: Self-pay | Admitting: "Endocrinology

## 2017-06-15 ENCOUNTER — Other Ambulatory Visit: Payer: Self-pay | Admitting: Family Medicine

## 2017-06-18 ENCOUNTER — Telehealth: Payer: Self-pay

## 2017-06-18 ENCOUNTER — Other Ambulatory Visit: Payer: Self-pay

## 2017-06-18 MED ORDER — INSULIN ASPART 100 UNIT/ML FLEXPEN: 5.0000 [IU] | PEN_INJECTOR | Freq: Three times a day (TID) | SUBCUTANEOUS | 0 refills | Status: DC

## 2017-06-18 MED ORDER — INSULIN GLARGINE 100 UNIT/ML SOLOSTAR PEN: PEN_INJECTOR | SUBCUTANEOUS | 0 refills | Status: DC

## 2017-07-07 ENCOUNTER — Other Ambulatory Visit: Payer: Self-pay | Admitting: "Endocrinology

## 2017-07-07 DIAGNOSIS — E1165 Type 2 diabetes mellitus with hyperglycemia: Secondary | ICD-10-CM

## 2017-07-09 ENCOUNTER — Ambulatory Visit: Payer: BLUE CROSS/BLUE SHIELD | Admitting: "Endocrinology

## 2017-07-09 ENCOUNTER — Other Ambulatory Visit: Payer: Self-pay | Admitting: "Endocrinology

## 2017-07-10 LAB — HGB A1C W/O EAG: Hgb A1c MFr Bld: >15.5 % — ABNORMAL HIGH (ref 4.8–5.6)

## 2017-07-28 ENCOUNTER — Encounter: Payer: Self-pay | Admitting: "Endocrinology

## 2017-07-28 ENCOUNTER — Ambulatory Visit (INDEPENDENT_AMBULATORY_CARE_PROVIDER_SITE_OTHER): Payer: BLUE CROSS/BLUE SHIELD | Admitting: "Endocrinology

## 2017-07-28 VITALS — BP 168/94 | HR 90 | Ht 70.0 in | Wt 184.0 lb

## 2017-07-28 DIAGNOSIS — Z9119 Patient's noncompliance with other medical treatment and regimen: Secondary | ICD-10-CM

## 2017-07-28 DIAGNOSIS — I10 Essential (primary) hypertension: Secondary | ICD-10-CM

## 2017-07-28 DIAGNOSIS — E119 Type 2 diabetes mellitus without complications: Secondary | ICD-10-CM | POA: Diagnosis not present

## 2017-07-28 DIAGNOSIS — Z91199 Patient's noncompliance with other medical treatment and regimen due to unspecified reason: Secondary | ICD-10-CM

## 2017-07-28 MED ORDER — INSULIN ASPART 100 UNIT/ML FLEXPEN: 5.0000 [IU] | PEN_INJECTOR | Freq: Three times a day (TID) | SUBCUTANEOUS | 2 refills | Status: DC

## 2017-07-28 MED ORDER — INSULIN GLARGINE 100 UNIT/ML SOLOSTAR PEN: PEN_INJECTOR | SUBCUTANEOUS | 2 refills | Status: DC

## 2017-07-28 MED ORDER — GLUCOSE BLOOD VI STRP: ORAL_STRIP | 2 refills | Status: DC

## 2017-07-28 NOTE — Progress Notes (Signed)
Subjective:    Patient ID: Randy Oneal, male    DOB: 1965/09/16, PCP [name], MD   Past Medical History:  Diagnosis Date  . Diabetes mellitus    Insulin dependent  . Hypertension   . Renal insufficiency    Past Surgical History:  Procedure Laterality Date  . COLONOSCOPY  06/24/2011   Procedure: COLONOSCOPY;  Surgeon: [name], MD;  Location: [facility];  Service: Endoscopy;  Laterality: N/A;  8:30 AM   Social History   Socioeconomic History  . Marital status: Married    Spouse name: None  . Number of children: None  . Years of education: None  . Highest education level: None  Social Needs  . Financial resource strain: None  . Food insecurity - worry: None  . Food insecurity - inability: None  . Transportation needs - medical: None  . Transportation needs - non-medical: None  Occupational History  . None  Tobacco Use  . Smoking status: Never Smoker  . Smokeless tobacco: Never Used  Substance and Sexual Activity  . Alcohol use: No  . Drug use: No  . Sexual activity: None  Other Topics Concern  . None  Social History Narrative  . None   Outpatient Encounter Medications as of 07/28/2017  Medication Sig  . amLODipine (NORVASC) 10 MG tablet Take 1 tablet (10 mg total) by mouth daily.  Marland Kitchen atorvastatin (LIPITOR) 10 MG tablet Take 1 tablet (10 mg total) by mouth daily.  . insulin aspart (NOVOLOG FLEXPEN) 100 UNIT/ML FlexPen Inject 5-11 Units into the skin 3 (three) times daily with meals.  . Insulin Glargine (LANTUS SOLOSTAR) 100 UNIT/ML Solostar Pen INJECT 30 UNITS SUBCUTANEOUSLY AT BEDTIME.  Marland Kitchen lisinopril (PRINIVIL,ZESTRIL) 20 MG tablet TAKE (1) TABLET BY MOUTH ONCE DAILY FOR BLOOD PRESSURE.  . [DISCONTINUED] insulin aspart (NOVOLOG FLEXPEN) 100 UNIT/ML FlexPen Inject 5-11 Units 3 (three) times daily with meals into the skin.  . [DISCONTINUED] Insulin Glargine (LANTUS SOLOSTAR) 100 UNIT/ML Solostar Pen INJECT 30 UNITS SUBCUTANEOUSLY AT BEDTIME.   Marland Kitchen glucose blood (BAYER CONTOUR NEXT TEST) test strip USE TO TEST FOUR TIMES DAILY.  Marland Kitchen Insulin Pen Needle (B-D ULTRAFINE Oneal SHORT PEN) 31G X 8 MM MISC 1 each by Does not apply route as directed.  Marland Kitchen MICROLET LANCETS MISC USE FOUR TIMES A DAY AS DIRECTED.  Marland Kitchen NOVOFINE 32G X 6 MM MISC USE AS DIRECTED WITH INSULIN PEN FOUR TIMES DAILY.  . sildenafil (REVATIO) 20 MG tablet May take up to 5 before relations as directed  . [DISCONTINUED] BAYER CONTOUR NEXT TEST test strip USE TO TEST FOUR TIMES DAILY.   No facility-administered encounter medications on file as of 07/28/2017.    ALLERGIES: No Known Allergies VACCINATION STATUS: Immunization History  Administered Date(s) Administered  . Pneumococcal Polysaccharide-23 01/30/2014    Diabetes  He presents for his follow-up (He was last seen in November 2015, unfortunately he no showed for so long.) diabetic visit. He has type 2 diabetes mellitus. Onset time: He was diagnosed at approximate age of 75 years. His disease course has been worsening. There are no hypoglycemic associated symptoms. Pertinent negatives for hypoglycemia include no confusion, headaches, pallor or seizures. Associated symptoms include fatigue, polydipsia and polyuria. Pertinent negatives for diabetes include no blurred vision, no chest pain, no polyphagia and no weakness. There are no hypoglycemic complications. Symptoms are worsening. There are no diabetic complications. Risk factors for coronary artery disease include diabetes mellitus, hypertension, male sex and sedentary lifestyle.  He is compliant with treatment none of the time (He came with a log showing persistently elevated glycemic profile and his recent A1c was 16.8%.). His weight is decreasing steadily. He is following a generally unhealthy diet. When asked about meal planning, he reported none. Prior visit with dietitian: He missed his visits with the dietitian also. He never participates in exercise. (He missed his  appointments since May 2017. He brought his  Meter which has only 2 readings since July 2018. His most recent A1c was >15.5%.) An ACE inhibitor/angiotensin II receptor blocker is being taken. Eye exam is current.  Hypertension  This is a chronic problem. The current episode started more than 1 year ago. The problem is controlled. Pertinent negatives include no blurred vision, chest pain, headaches, neck pain, palpitations or shortness of breath. Risk factors for coronary artery disease include diabetes mellitus, sedentary lifestyle and male gender. Past treatments include ACE inhibitors.     Review of Systems  Constitutional: Positive for fatigue. Negative for chills, fever and unexpected weight change.  HENT: Negative for dental problem, mouth sores and trouble swallowing.   Eyes: Negative for blurred vision and visual disturbance.  Respiratory: Negative for cough, choking, chest tightness, shortness of breath and wheezing.   Cardiovascular: Negative for chest pain, palpitations and leg swelling.  Gastrointestinal: Negative for abdominal distention, abdominal pain, constipation, diarrhea, nausea and vomiting.  Endocrine: Positive for polydipsia and polyuria. Negative for polyphagia.  Genitourinary: Negative for dysuria, flank pain, hematuria and urgency.  Musculoskeletal: Negative for back pain, gait problem, myalgias and neck pain.  Skin: Negative for pallor, rash and wound.  Neurological: Negative for seizures, syncope, weakness, numbness and headaches.  Psychiatric/Behavioral: Negative.  Negative for confusion and dysphoric mood.    Objective:    BP (!) 168/94   Pulse 90   Ht 5\' 10"  (1.778 m)   Wt 184 lb (83.5 kg)   BMI 26.40 kg/m   Wt Readings from Last 3 Encounters:  07/28/17 184 lb (83.5 kg)  03/04/17 194 lb (88 kg)  08/21/16 184 lb (83.5 kg)    Physical Exam  Constitutional: He is oriented to person, place, and time. He appears well-developed and well-nourished. He is  cooperative. No distress.  HENT:  Head: Normocephalic and atraumatic.  Eyes: EOM are normal.  Neck: Normal range of motion. Neck supple. No tracheal deviation present. No thyromegaly present.  Cardiovascular: Normal rate, S1 normal, S2 normal and normal heart sounds. Exam reveals no gallop.  No murmur heard. Pulses:      Dorsalis pedis pulses are 1+ on the right side, and 1+ on the left side.       Posterior tibial pulses are 1+ on the right side, and 1+ on the left side.  Pulmonary/Chest: Breath sounds normal. No respiratory distress. He has no wheezes.  Abdominal: Soft. Bowel sounds are normal. He exhibits no distension. There is no tenderness. There is no guarding and no CVA tenderness.  Musculoskeletal: He exhibits no edema.       Right shoulder: He exhibits no swelling and no deformity.  Neurological: He is alert and oriented to person, place, and time. He has normal strength and normal reflexes. No cranial nerve deficit or sensory deficit. Gait normal.  Skin: Skin is warm and dry. No rash noted. No cyanosis. Nails show no clubbing.  Psychiatric: His speech is normal. Cognition and memory are normal.  He has hx of alarming noncompliance, and does have a reluctant affect. He minimizes his body symptoms including  polyuria , nocturia which are likely secondary to severe hyperglycemia.    CMP     Component Value Date/Time   NA 135 10/30/2016 1207   K 4.8 10/30/2016 1207   CL 98 10/30/2016 1207   CO2 25 10/30/2016 1207   GLUCOSE 280 (H) 10/30/2016 1207   GLUCOSE 361 (H) 11/05/2015 1119   BUN 19 10/30/2016 1207   CREATININE 1.34 (H) 10/30/2016 1207   CREATININE 1.25 11/05/2015 1119   CALCIUM 9.4 10/30/2016 1207   PROT 6.5 10/30/2016 1207   ALBUMIN 3.8 10/30/2016 1207   AST 23 10/30/2016 1207   ALT 27 10/30/2016 1207   ALKPHOS 78 10/30/2016 1207   BILITOT 0.5 10/30/2016 1207   GFRNONAA 61 10/30/2016 1207   GFRAA 71 10/30/2016 1207     Diabetic Labs (most recent): Lab  Results  Component Value Date   HGBA1C >15.5 (H) 07/09/2017   HGBA1C 14.8 (H) 10/30/2016   HGBA1C 16.8 (H) 11/05/2015     Lipid Panel ( most recent) Lipid Panel     Component Value Date/Time   CHOL 201 (H) 10/30/2016 1207   TRIG 84 10/30/2016 1207   HDL 66 10/30/2016 1207   CHOLHDL 3.0 10/30/2016 1207   CHOLHDL 2.4 11/05/2015 1122   VLDL 12 11/05/2015 1122   LDLCALC 118 (H) 10/30/2016 1207      Assessment & Plan:   1. Type 2 diabetes mellitus not at goal [facility])  - His diabetes is  complicated by non-compliance/ nonadherence. He missed his appointments since May 2017.   - He remains at a high risk for more acute and chronic complications of diabetes which include CAD, CVA, CKD, retinopathy, and neuropathy. These are all discussed in detail with the patient.  - Has last seen in May 2017, comes in with a meter which shows only 2 readings since July 2018 . - His most recent labs show A1c of >15.5%. He did have A1c as high as 16.8% last year. -He has a habit of disappearing from care and noncompliance. - I have re-counseled the patient  to stay engaged in intensive insulin management of his diabetes along with diet management   by adopting a carbohydrate restricted / protein rich  Diet.  -  Suggestion is made for him to avoid simple carbohydrates  from his diet including Cakes, Sweet Desserts / Pastries, Ice Cream, Soda (diet and regular), Sweet Tea, Candies, Chips, Cookies, Store Bought Juices, Alcohol in Excess of  1-2 drinks a day, Artificial Sweeteners, and "Sugar-free" Products. This will help patient to have stable blood glucose profile and potentially avoid unintended weight gain.   - Patient is advised to stick to a routine mealtimes to eat 3 meals  a day and avoid unnecessary snacks ( to snack only to correct hypoglycemia).  - The patient  has been  scheduled with Jearld Fenton, RDN, CDE for individualized DM education- he missed several appointments already.  - I have  approached patient with the following individualized plan to manage diabetes and patient agrees.  - He promises to do better. I urged him to proceed with basal insulin Lantus 30 units daily at bedtime, NovoLog 5 units 3 times a day before meals for pre-meal blood glucose above 90 mg/dL plus correction, associated with strict monitoring of glucose 4 times a day-before meals and at bedtime and return in 10 days for reevaluation.  - Patient is warned not to take insulin without proper monitoring per orders.  -Patient is encouraged to call clinic for  blood glucose levels less than 70 or above 300 mg /dl. - He does not display a typical type 2 diabetes features. 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 - He will have lab work for anti-GAD and anti-islet cell antibodies when his A1c approaches target. - He is not a good candidate for incretin therapy nor SGLT2 inhibitors. - Patient specific target  for A1c; LDL, HDL, Triglycerides, and  Waist Circumference were discussed in detail.  2) BP/HTN: Uncontrolled, he could not confirm to me if he is taking his blood pressure medications. He is on lisinopril 20 mg by mouth daily  and amlodipine 10 mg by mouth daily. I urged him to be consistent and continue with this blood pressure medications.   3) Lipids/HPL:  uncontrolled ,  LDL was 118 on 10/30/2016 increasing from 77.  He is advised to continue statins. 4)  Weight/Diet: CDE consult in progress, exercise, and carbohydrates information provided. 5) Personal history of noncompliance with medical treatment, presenting hazards to health - I spent 30 minutes counseling this percent for better engagement to treat his diabetes.  5) Chronic Care/Health Maintenance:  -Patient is on ACEI/ARB and encouraged to continue to follow up with Ophthalmology, Podiatrist at least yearly or according to recommendations, and advised to  stay away from smoking. I have recommended yearly flu vaccine and pneumonia vaccination at  least every 5 years; moderate intensity exercise for up to 150 minutes weekly; and  sleep for at least 7 hours a day.   - I advised patient to maintain close follow up with [name], MD for primary care needs.  - Time spent with the patient: 25 min, of which >50% was spent in reviewing his sugar logs , discussing his hypo- and hyper-glycemic episodes, reviewing his current and  previous labs and insulin doses and developing a plan to avoid hypo- and hyper-glycemia.    Follow up plan: -Return in about 10 days (around 08/07/2017) for follow up with meter and logs- no labs.  Glade Lloyd, MD Phone: [phone]  Fax: [phone]  -  This note was partially dictated with voice recognition software. Similar sounding words can be transcribed inadequately or may not  be corrected upon review.  07/28/2017, 1:18 PM

## 2017-08-10 ENCOUNTER — Ambulatory Visit (INDEPENDENT_AMBULATORY_CARE_PROVIDER_SITE_OTHER): Payer: BLUE CROSS/BLUE SHIELD | Admitting: "Endocrinology

## 2017-08-10 ENCOUNTER — Encounter: Payer: Self-pay | Admitting: "Endocrinology

## 2017-08-10 VITALS — BP 168/95 | HR 86 | Ht 70.0 in | Wt 195.0 lb

## 2017-08-10 DIAGNOSIS — E1165 Type 2 diabetes mellitus with hyperglycemia: Secondary | ICD-10-CM | POA: Diagnosis not present

## 2017-08-10 DIAGNOSIS — Z9119 Patient's noncompliance with other medical treatment and regimen: Secondary | ICD-10-CM | POA: Diagnosis not present

## 2017-08-10 DIAGNOSIS — Z91199 Patient's noncompliance with other medical treatment and regimen due to unspecified reason: Secondary | ICD-10-CM

## 2017-08-10 DIAGNOSIS — I10 Essential (primary) hypertension: Secondary | ICD-10-CM | POA: Diagnosis not present

## 2017-08-10 MED ORDER — FREESTYLE LIBRE READER DEVI: 1.0000 | Freq: Once | 0 refills | Status: AC

## 2017-08-10 MED ORDER — LISINOPRIL 30 MG PO TABS: ORAL_TABLET | ORAL | 3 refills | Status: DC

## 2017-08-10 MED ORDER — FREESTYLE LIBRE SENSOR SYSTEM MISC: 2 refills | Status: DC

## 2017-08-10 NOTE — Progress Notes (Signed)
Subjective:    Patient ID: Randy Oneal, male    DOB: 01-05-1966, PCP [name], MD   Past Medical History:  Diagnosis Date  . Diabetes mellitus    Insulin dependent  . Hypertension   . Renal insufficiency    Past Surgical History:  Procedure Laterality Date  . COLONOSCOPY  06/24/2011   Procedure: COLONOSCOPY;  Surgeon: [name], MD;  Location: [facility];  Service: Endoscopy;  Laterality: N/A;  8:30 AM   Social History   Socioeconomic History  . Marital status: Married    Spouse name: None  . Number of children: None  . Years of education: None  . Highest education level: None  Social Needs  . Financial resource strain: None  . Food insecurity - worry: None  . Food insecurity - inability: None  . Transportation needs - medical: None  . Transportation needs - non-medical: None  Occupational History  . None  Tobacco Use  . Smoking status: Never Smoker  . Smokeless tobacco: Never Used  Substance and Sexual Activity  . Alcohol use: No  . Drug use: No  . Sexual activity: None  Other Topics Concern  . None  Social History Narrative  . None   Outpatient Encounter Medications as of 08/10/2017  Medication Sig  . amLODipine (NORVASC) 10 MG tablet Take 1 tablet (10 mg total) by mouth daily.  Marland Kitchen atorvastatin (LIPITOR) 10 MG tablet Take 1 tablet (10 mg total) by mouth daily.  . Continuous Blood Gluc Receiver (FREESTYLE LIBRE READER) DEVI 1 Piece by Does not apply route once for 1 dose.  . Continuous Blood Gluc Sensor (FREESTYLE LIBRE SENSOR SYSTEM) MISC Use one sensor every 10 days.  Marland Kitchen glucose blood (BAYER CONTOUR NEXT TEST) test strip USE TO TEST FOUR TIMES DAILY.  Marland Kitchen insulin aspart (NOVOLOG FLEXPEN) 100 UNIT/ML FlexPen Inject 5-11 Units into the skin 3 (three) times daily with meals.  . Insulin Glargine (LANTUS SOLOSTAR) 100 UNIT/ML Solostar Pen INJECT 30 UNITS SUBCUTANEOUSLY AT BEDTIME.  . Insulin Pen Needle (B-D ULTRAFINE Oneal SHORT PEN) 31G X 8 MM  MISC 1 each by Does not apply route as directed.  Marland Kitchen lisinopril (PRINIVIL,ZESTRIL) 20 MG tablet TAKE (1) TABLET BY MOUTH ONCE DAILY FOR BLOOD PRESSURE.  Marland Kitchen MICROLET LANCETS MISC USE FOUR TIMES A DAY AS DIRECTED.  Marland Kitchen NOVOFINE 32G X 6 MM MISC USE AS DIRECTED WITH INSULIN PEN FOUR TIMES DAILY.  . sildenafil (REVATIO) 20 MG tablet May take up to 5 before relations as directed   No facility-administered encounter medications on file as of 08/10/2017.    ALLERGIES: No Known Allergies VACCINATION STATUS: Immunization History  Administered Date(s) Administered  . Pneumococcal Polysaccharide-23 01/30/2014    Diabetes  He presents for his follow-up (He was last seen in November 2015, unfortunately he no showed for so long.) diabetic visit. He has type 2 diabetes mellitus. Onset time: He was diagnosed at approximate age of 61 years. His disease course has been worsening. There are no hypoglycemic associated symptoms. Pertinent negatives for hypoglycemia include no confusion, headaches, pallor or seizures. Associated symptoms include fatigue, polydipsia and polyuria. Pertinent negatives for diabetes include no blurred vision, no chest pain, no polyphagia and no weakness. There are no hypoglycemic complications. Symptoms are worsening. There are no diabetic complications. Risk factors for coronary artery disease include diabetes mellitus, hypertension, male sex and sedentary lifestyle. He is compliant with treatment none of the time (He came with a log showing persistently  elevated glycemic profile and his recent A1c was 16.8%.). His weight is decreasing steadily. He is following a generally unhealthy diet. When asked about meal planning, he reported none. Prior visit with dietitian: He missed his visits with the dietitian also. He never participates in exercise. His breakfast blood glucose range is generally >200 mg/dl. His lunch blood glucose range is generally >200 mg/dl. His dinner blood glucose range is  generally >200 mg/dl. His bedtime blood glucose range is generally >200 mg/dl. His overall blood glucose range is >200 mg/dl. ( His most recent A1c was >15.5%.) An ACE inhibitor/angiotensin II receptor blocker is being taken. Eye exam is current.  Hypertension  This is a chronic problem. The current episode started more than 1 year ago. The problem is controlled. Pertinent negatives include no blurred vision, chest pain, headaches, neck pain, palpitations or shortness of breath. Risk factors for coronary artery disease include diabetes mellitus, sedentary lifestyle and male gender. Past treatments include ACE inhibitors.     Review of Systems  Constitutional: Positive for fatigue. Negative for chills, fever and unexpected weight change.  HENT: Negative for dental problem, mouth sores and trouble swallowing.   Eyes: Negative for blurred vision and visual disturbance.  Respiratory: Negative for cough, choking, chest tightness, shortness of breath and wheezing.   Cardiovascular: Negative for chest pain, palpitations and leg swelling.  Gastrointestinal: Negative for abdominal distention, abdominal pain, constipation, diarrhea, nausea and vomiting.  Endocrine: Positive for polydipsia and polyuria. Negative for polyphagia.  Genitourinary: Negative for dysuria, flank pain, hematuria and urgency.  Musculoskeletal: Negative for back pain, gait problem, myalgias and neck pain.  Skin: Negative for pallor, rash and wound.  Neurological: Negative for seizures, syncope, weakness, numbness and headaches.  Psychiatric/Behavioral: Negative.  Negative for confusion and dysphoric mood.    Objective:    BP (!) 168/95   Pulse 86   Ht 5\' 10"  (1.778 m)   Wt 195 lb (88.5 kg)   BMI 27.98 kg/m   Wt Readings from Last 3 Encounters:  08/10/17 195 lb (88.5 kg)  07/28/17 184 lb (83.5 kg)  03/04/17 194 lb (88 kg)    Physical Exam  Constitutional: He is oriented to person, place, and time. He appears  well-developed and well-nourished. He is cooperative. No distress.  HENT:  Head: Normocephalic and atraumatic.  Eyes: EOM are normal.  Neck: Normal range of motion. Neck supple. No tracheal deviation present. No thyromegaly present.  Cardiovascular: Normal rate, S1 normal, S2 normal and normal heart sounds. Exam reveals no gallop.  No murmur heard. Pulses:      Dorsalis pedis pulses are 1+ on the right side, and 1+ on the left side.       Posterior tibial pulses are 1+ on the right side, and 1+ on the left side.  Pulmonary/Chest: Breath sounds normal. No respiratory distress. He has no wheezes.  Abdominal: Soft. Bowel sounds are normal. He exhibits no distension. There is no tenderness. There is no guarding and no CVA tenderness.  Musculoskeletal: He exhibits no edema.       Right shoulder: He exhibits no swelling and no deformity.  Neurological: He is alert and oriented to person, place, and time. He has normal strength and normal reflexes. No cranial nerve deficit or sensory deficit. Gait normal.  Skin: Skin is warm and dry. No rash noted. No cyanosis. Nails show no clubbing.  Psychiatric: His speech is normal. Cognition and memory are normal.  He has hx of alarming noncompliance, and does have  a reluctant affect. He minimizes his body symptoms including polyuria , nocturia which are likely secondary to severe hyperglycemia.    CMP     Component Value Date/Time   NA 135 10/30/2016 1207   K 4.8 10/30/2016 1207   CL 98 10/30/2016 1207   CO2 25 10/30/2016 1207   GLUCOSE 280 (H) 10/30/2016 1207   GLUCOSE 361 (H) 11/05/2015 1119   BUN 19 10/30/2016 1207   CREATININE 1.34 (H) 10/30/2016 1207   CREATININE 1.25 11/05/2015 1119   CALCIUM 9.4 10/30/2016 1207   PROT 6.5 10/30/2016 1207   ALBUMIN 3.8 10/30/2016 1207   AST 23 10/30/2016 1207   ALT 27 10/30/2016 1207   ALKPHOS 78 10/30/2016 1207   BILITOT 0.5 10/30/2016 1207   GFRNONAA 61 10/30/2016 1207   GFRAA 71 10/30/2016 1207      Diabetic Labs (most recent): Lab Results  Component Value Date   HGBA1C >15.5 (H) 07/09/2017   HGBA1C 14.8 (H) 10/30/2016   HGBA1C 16.8 (H) 11/05/2015     Lipid Panel ( most recent) Lipid Panel     Component Value Date/Time   CHOL 201 (H) 10/30/2016 1207   TRIG 84 10/30/2016 1207   HDL 66 10/30/2016 1207   CHOLHDL 3.0 10/30/2016 1207   CHOLHDL 2.4 11/05/2015 1122   VLDL 12 11/05/2015 1122   LDLCALC 118 (H) 10/30/2016 1207      Assessment & Plan:   1. Type 2 diabetes mellitus not at goal [facility])  - His diabetes is  complicated by non-compliance/ nonadherence. He missed his appointments since May 2017.   - He remains at a high risk for more acute and chronic complications of diabetes which include CAD, CVA, CKD, retinopathy, and neuropathy. These are all discussed in detail with the patient.  - His most recent labs show A1c of >15.5%. - She still has significantly above target blood glucose profile, did better on insulin injections and monitoring this time. - He did have A1c as high as 16.8% last year. -He has a habit of disappearing from care and noncompliance. - I have re-counseled the patient  to stay engaged in intensive insulin management of his diabetes along with diet management   by adopting a carbohydrate restricted / protein rich  Diet.  -  Suggestion is made for him to avoid simple carbohydrates  from his diet including Cakes, Sweet Desserts / Pastries, Ice Cream, Soda (diet and regular), Sweet Tea, Candies, Chips, Cookies, Store Bought Juices, Alcohol in Excess of  1-2 drinks a day, Artificial Sweeteners, and "Sugar-free" Products. This will help patient to have stable blood glucose profile and potentially avoid unintended weight gain.   - Patient is advised to stick to a routine mealtimes to eat 3 meals  a day and avoid unnecessary snacks ( to snack only to correct hypoglycemia).  - The patient  has been  scheduled with Jearld Fenton, RDN, CDE for  individualized DM education- he missed several appointments already.  - I have approached patient with the following individualized plan to manage diabetes and patient agrees.  - He promises to continue to engage. - I advised him to increase his basal insulin Lantus to 40 units daily at bedtime, increase NovoLog to 8 units 3 times a day before meals for pre-meal blood glucose above 90 mg/dL plus correction, associated with strict monitoring of glucose 4 times a day-before meals and at bedtime and return in 10 days for reevaluation.  - Patient is warned not to take insulin  without proper monitoring per orders. - I discussed and initiated a prescription for continuous glucose monitoring device.  -Patient is encouraged to call clinic for blood glucose levels less than 70 or above 300 mg /dl. - He does not display a typical type 2 diabetes features. 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 - He will have lab work for anti-GAD and anti-islet cell antibodies when his A1c approaches target. - He is not a good candidate for incretin therapy nor SGLT2 inhibitors. - Patient specific target  for A1c; LDL, HDL, Triglycerides, and  Waist Circumference were discussed in detail.  2) BP/HTN:   Uncontrolled.  I will increase his lisinopril to 30 minute grams by mouth daily in the morning, along with amlodipine 10 mg by mouth daily. I urged him to be consistent and continue with this blood pressure medications.   3) Lipids/HPL:  uncontrolled ,  LDL was 118 on 10/30/2016 increasing from 77.  Is willing to reengage with statin treatment.  4)  Weight/Diet: CDE consult in progress, exercise, and carbohydrates information provided.  5) Chronic Care/Health Maintenance:  -Patient is on ACEI/ARB and encouraged to continue to follow up with Ophthalmology, Podiatrist at least yearly or according to recommendations, and advised to  stay away from smoking. I have recommended yearly flu vaccine and pneumonia vaccination at  least every 5 years; moderate intensity exercise for up to 150 minutes weekly; and  sleep for at least 7 hours a day.  - I advised patient to maintain close follow up with [name], MD for primary care needs.  - Time spent with the patient: 25 min, of which >50% was spent in reviewing his blood glucose logs , discussing his hypo- and hyper-glycemic episodes, reviewing his current and  previous labs and insulin doses and developing a plan to avoid hypo- and hyper-glycemia. Please refer to Patient Instructions for Blood Glucose Monitoring and Insulin/Medications Dosing Guide"  in media tab for additional information.  Follow up plan: -Return in about 9 weeks (around 10/12/2017) for follow up with pre-visit labs, meter, and logs.  Glade Lloyd, MD Phone: [phone]  Fax: [phone]  -  This note was partially dictated with voice recognition software. Similar sounding words can be transcribed inadequately or may not  be corrected upon review.  08/10/2017, 10:18 AM

## 2017-08-10 NOTE — Patient Instructions (Signed)

## 2017-09-04 ENCOUNTER — Ambulatory Visit: Payer: BLUE CROSS/BLUE SHIELD | Admitting: Family Medicine

## 2017-10-13 ENCOUNTER — Ambulatory Visit: Payer: BLUE CROSS/BLUE SHIELD | Admitting: "Endocrinology

## 2017-11-15 ENCOUNTER — Other Ambulatory Visit: Payer: Self-pay | Admitting: Family Medicine

## 2017-12-28 ENCOUNTER — Other Ambulatory Visit: Payer: Self-pay | Admitting: Family Medicine

## 2017-12-28 ENCOUNTER — Other Ambulatory Visit: Payer: Self-pay

## 2017-12-28 MED ORDER — AMLODIPINE BESYLATE 10 MG PO TABS: 10.0000 mg | ORAL_TABLET | Freq: Every day | ORAL | 0 refills | Status: DC

## 2017-12-28 MED ORDER — ATORVASTATIN CALCIUM 10 MG PO TABS: 10.0000 mg | ORAL_TABLET | Freq: Every day | ORAL | 0 refills | Status: DC

## 2018-01-26 ENCOUNTER — Ambulatory Visit: Payer: Managed Care, Other (non HMO) | Admitting: Family Medicine

## 2018-01-26 ENCOUNTER — Encounter: Payer: Self-pay | Admitting: Family Medicine

## 2018-01-26 VITALS — BP 134/92 | Ht 70.0 in | Wt 188.0 lb

## 2018-01-26 DIAGNOSIS — E785 Hyperlipidemia, unspecified: Secondary | ICD-10-CM | POA: Diagnosis not present

## 2018-01-26 DIAGNOSIS — E119 Type 2 diabetes mellitus without complications: Secondary | ICD-10-CM

## 2018-01-26 DIAGNOSIS — I10 Essential (primary) hypertension: Secondary | ICD-10-CM | POA: Diagnosis not present

## 2018-01-26 DIAGNOSIS — E1169 Type 2 diabetes mellitus with other specified complication: Secondary | ICD-10-CM

## 2018-01-26 DIAGNOSIS — Z125 Encounter for screening for malignant neoplasm of prostate: Secondary | ICD-10-CM

## 2018-01-26 DIAGNOSIS — R5383 Other fatigue: Secondary | ICD-10-CM | POA: Diagnosis not present

## 2018-01-26 DIAGNOSIS — E559 Vitamin D deficiency, unspecified: Secondary | ICD-10-CM

## 2018-01-26 MED ORDER — AMLODIPINE BESYLATE 10 MG PO TABS: 10.0000 mg | ORAL_TABLET | Freq: Every day | ORAL | 6 refills | Status: DC

## 2018-01-26 MED ORDER — LISINOPRIL 20 MG PO TABS: 20.0000 mg | ORAL_TABLET | Freq: Every day | ORAL | 6 refills | Status: DC

## 2018-01-26 MED ORDER — ATORVASTATIN CALCIUM 10 MG PO TABS
10.0000 mg | ORAL_TABLET | Freq: Every day | ORAL | Status: DC
Start: 2018-01-26 — End: 2018-12-20

## 2018-01-26 NOTE — Progress Notes (Signed)
   Subjective:    Patient ID: Randy Oneal, male    DOB: 04-29-1966, 52 y.o.   MRN: [id number]  Hypertension  This is a chronic problem. Pertinent negatives include no chest pain, headaches or shortness of breath. There are no compliance problems (takes meds every day, eats healthy and exercises).    Pt ran out of his lisinopril so he did not have that to take this morning.  The patient states that he ran out of this medicine this morning when I looked at his pill bottle is actually dated for over a month ago   patient also states he has a follow-up with Dr. Dorris Fetch but when I checked the schedule back  I do not see any follow-up at this time  Review of Systems  Constitutional: Negative for activity change, appetite change and fatigue.  HENT: Negative for congestion and rhinorrhea.   Respiratory: Negative for cough, chest tightness and shortness of breath.   Cardiovascular: Negative for chest pain and leg swelling.  Gastrointestinal: Negative for abdominal pain, diarrhea and nausea.  Endocrine: Negative for polydipsia and polyphagia.  Genitourinary: Negative for dysuria and hematuria.  Neurological: Negative for weakness and headaches.  Psychiatric/Behavioral: Negative for confusion and dysphoric mood.       Objective:   Physical Exam  Constitutional: He appears well-nourished. No distress.  Cardiovascular: Normal rate, regular rhythm and normal heart sounds.  No murmur heard. Pulmonary/Chest: Effort normal and breath sounds normal. No respiratory distress.  Musculoskeletal: He exhibits no edema.  Lymphadenopathy:    He has no cervical adenopathy.  Neurological: He is alert.  Psychiatric: His behavior is normal.  Vitals reviewed.    Diabetes under the care of Dr. Dorris Fetch     Assessment & Plan:  HTN- Patient was seen today as part of a visit regarding hypertension. The importance of healthy diet and regular physical activity was discussed. The importance of compliance with  medications discussed.  Ideal goal is to keep blood pressure low elevated levels certainly below 711/65 when possible.  The patient was counseled that keeping blood pressure under control lessen his risk of complications.  The importance of regular follow-ups was discussed with the patient.  Low-salt diet such as DASH recommended.  Regular physical activity was recommended as well.  Patient was advised to keep regular follow-ups.  Lab tests ordered for cholesterol blood pressure and diabetes we will share these results with Dr. Dorris Fetch patient follow-up here in 6 months

## 2018-02-05 LAB — MICROALBUMIN / CREATININE URINE RATIO
Creatinine, Urine: 44.9 mg/dL
MICROALB/CREAT RATIO: 1245.9 mg/g{creat} — AB (ref 0.0–30.0)
Microalbumin, Urine: 559.4 ug/mL

## 2018-02-05 LAB — BASIC METABOLIC PANEL
BUN / CREAT RATIO: 14 (ref 9–20)
BUN: 24 mg/dL (ref 6–24)
CHLORIDE: 99 mmol/L (ref 96–106)
CO2: 25 mmol/L (ref 20–29)
Calcium: 9.7 mg/dL (ref 8.7–10.2)
Creatinine, Ser: 1.67 mg/dL — ABNORMAL HIGH (ref 0.76–1.27)
GFR calc Af Amer: 54 mL/min/{1.73_m2} — ABNORMAL LOW (ref >59)
GFR calc non Af Amer: 47 mL/min/{1.73_m2} — ABNORMAL LOW (ref >59)
GLUCOSE: 522 mg/dL — AB (ref 65–99)
Potassium: 5.6 mmol/L — ABNORMAL HIGH (ref 3.5–5.2)
SODIUM: 135 mmol/L (ref 134–144)

## 2018-02-05 LAB — PSA: PROSTATE SPECIFIC AG, SERUM: 0.4 ng/mL (ref 0.0–4.0)

## 2018-02-05 LAB — LIPID PANEL
Chol/HDL Ratio: 2.8 ratio (ref 0.0–5.0)
Cholesterol, Total: 183 mg/dL (ref 100–199)
HDL: 65 mg/dL (ref >39)
LDL Calculated: 106 mg/dL — ABNORMAL HIGH (ref 0–99)
Triglycerides: 62 mg/dL (ref 0–149)
VLDL Cholesterol Cal: 12 mg/dL (ref 5–40)

## 2018-02-05 LAB — HEMOGLOBIN A1C: Est. average glucose Bld gHb Est-mCnc: >398 mg/dL

## 2018-02-05 LAB — HEPATIC FUNCTION PANEL
ALK PHOS: 96 IU/L (ref 39–117)
ALT: 37 IU/L (ref 0–44)
AST: 32 IU/L (ref 0–40)
Albumin: 3.8 g/dL (ref 3.5–5.5)
BILIRUBIN, DIRECT: 0.16 mg/dL (ref 0.00–0.40)
Bilirubin Total: 0.5 mg/dL (ref 0.0–1.2)
Total Protein: 6.5 g/dL (ref 6.0–8.5)

## 2018-02-05 LAB — TSH: TSH: 1.67 u[IU]/mL (ref 0.450–4.500)

## 2018-02-05 LAB — VITAMIN D 25 HYDROXY (VIT D DEFICIENCY, FRACTURES): Vit D, 25-Hydroxy: 8 ng/mL — ABNORMAL LOW (ref 30.0–100.0)

## 2018-02-05 LAB — T4, FREE: Free T4: 1.29 ng/dL (ref 0.82–1.77)

## 2018-02-08 ENCOUNTER — Encounter: Payer: Self-pay | Admitting: Family Medicine

## 2018-03-02 ENCOUNTER — Other Ambulatory Visit: Payer: Self-pay

## 2018-03-02 MED ORDER — INSULIN GLARGINE 100 UNIT/ML SOLOSTAR PEN
PEN_INJECTOR | SUBCUTANEOUS | 2 refills | Status: DC
Start: 2018-03-02 — End: 2018-03-04

## 2018-03-03 ENCOUNTER — Other Ambulatory Visit: Payer: Self-pay

## 2018-03-04 ENCOUNTER — Other Ambulatory Visit: Payer: Self-pay

## 2018-03-04 MED ORDER — INSULIN DEGLUDEC 100 UNIT/ML ~~LOC~~ SOPN: 30.0000 [IU] | PEN_INJECTOR | Freq: Every day | SUBCUTANEOUS | 2 refills | Status: DC

## 2018-07-18 ENCOUNTER — Other Ambulatory Visit: Payer: Self-pay | Admitting: "Endocrinology

## 2018-07-27 ENCOUNTER — Ambulatory Visit: Payer: Managed Care, Other (non HMO) | Admitting: Family Medicine

## 2018-08-12 ENCOUNTER — Encounter: Payer: Self-pay | Admitting: Family Medicine

## 2018-08-12 ENCOUNTER — Ambulatory Visit: Payer: Managed Care, Other (non HMO) | Admitting: Family Medicine

## 2018-08-12 VITALS — BP 142/86 | Ht 70.0 in | Wt 186.0 lb

## 2018-08-12 DIAGNOSIS — N410 Acute prostatitis: Secondary | ICD-10-CM | POA: Diagnosis not present

## 2018-08-12 MED ORDER — CIPROFLOXACIN HCL 500 MG PO TABS: ORAL_TABLET | ORAL | 0 refills | Status: DC

## 2018-08-12 MED ORDER — ONDANSETRON 4 MG PO TBDP: 4.0000 mg | ORAL_TABLET | Freq: Three times a day (TID) | ORAL | 0 refills | Status: DC | PRN

## 2018-08-12 NOTE — Progress Notes (Signed)
Subjective:    Patient ID: Randy Oneal, male    DOB: [birth year], 53 y.o.   MRN: [id number]  Fever   This is a new problem. The current episode started in the past 7 days. Associated symptoms include headaches, nausea and vomiting. Associated symptoms comments: chills. He has tried NSAIDs (vit c) for the symptoms.   Results for orders placed or performed in visit on 01/26/18  PSA  Result Value Ref Range   Prostate Specific Ag, Serum 0.4 0.0 - 4.0 ng/mL  Urine Microalbumin w/creat. ratio  Result Value Ref Range   Creatinine, Urine 44.9 Not Estab. mg/dL   Microalbumin, Urine 559.4 Not Estab. ug/mL   Microalb/Creat Ratio 1,245.9 (H) 0.0 - 30.0 mg/g creat  Lipid Profile  Result Value Ref Range   Cholesterol, Total 183 100 - 199 mg/dL   Triglycerides 62 0 - 149 mg/dL   HDL 65 >39 mg/dL   VLDL Cholesterol Cal 12 5 - 40 mg/dL   LDL Calculated 106 (H) 0 - 99 mg/dL   Chol/HDL Ratio 2.8 0.0 - 5.0 ratio  Hepatic function panel  Result Value Ref Range   Total Protein 6.5 6.0 - 8.5 g/dL   Albumin 3.8 3.5 - 5.5 g/dL   Bilirubin Total 0.5 0.0 - 1.2 mg/dL   Bilirubin, Direct 0.16 0.00 - 0.40 mg/dL   Alkaline Phosphatase 96 39 - 117 IU/L   AST 32 0 - 40 IU/L   ALT 37 0 - 44 IU/L  Basic Metabolic Panel (BMET)  Result Value Ref Range   Glucose 522 (HH) 65 - 99 mg/dL   BUN 24 6 - 24 mg/dL   Creatinine, Ser 1.67 (H) 0.76 - 1.27 mg/dL   GFR calc non Af Amer 47 (L) >59 mL/min/1.73   GFR calc Af Amer 54 (L) >59 mL/min/1.73   BUN/Creatinine Ratio 14 9 - 20   Sodium 135 134 - 144 mmol/L   Potassium 5.6 (H) 3.5 - 5.2 mmol/L   Chloride 99 96 - 106 mmol/L   CO2 25 20 - 29 mmol/L   Calcium 9.7 8.7 - 10.2 mg/dL  HgB A1c  Result Value Ref Range   Hgb A1c MFr Bld >15.5 (H) 4.8 - 5.6 %   Est. average glucose Bld gHb Est-mCnc >398 mg/dL  Vitamin D (25 hydroxy)  Result Value Ref Range   Vit D, 25-Hydroxy 8.0 (L) 30.0 - 100.0 ng/mL  TSH  Result Value Ref Range   TSH 1.670 0.450 - 4.500 uIU/mL    T4, free  Result Value Ref Range   Free T4 1.29 0.82 - 1.77 ng/dL    Little after christmas    tmax 103    Right sided pain  No urination issues   Headache pretty rough   No hx of id or prost  infxn  No coug     Motrin prn    Review of Systems  Constitutional: Positive for fever.  Gastrointestinal: Positive for nausea and vomiting.  Neurological: Positive for headaches.       Objective:   Physical Exam  Alert no obvious malaise HEENT normal lungs clear heart regular rhythm no CVA tenderness.  Slight lateral chest wall tenderness to deep palpation abdominal exam excellent bowel sounds no discrete tenderness at all  Prostate boggy tender  Impression febrile illness with acute prostatitis highest on the list.  Headache nausea and vomiting.  Unable to get urine specimen.  Right lateral chest wall pain occurred after sleeping funny worse with movement  doubt related.  Cipro given Zofran given warning signs discussed follow-up soon for 38-month checkup which patient is late on      Assessment & Plan:

## 2018-08-16 ENCOUNTER — Other Ambulatory Visit (HOSPITAL_COMMUNITY)
Admission: RE | Admit: 2018-08-16 | Discharge: 2018-08-16 | Disposition: A | Discharge status: Home / Self Care | Payer: Managed Care, Other (non HMO) | Source: Ambulatory Visit | Attending: Family Medicine | Admitting: Family Medicine

## 2018-08-16 ENCOUNTER — Encounter (HOSPITAL_COMMUNITY): Payer: Self-pay | Admitting: Emergency Medicine

## 2018-08-16 ENCOUNTER — Ambulatory Visit: Payer: Managed Care, Other (non HMO) | Admitting: Family Medicine

## 2018-08-16 ENCOUNTER — Encounter: Payer: Self-pay | Admitting: Family Medicine

## 2018-08-16 ENCOUNTER — Other Ambulatory Visit: Payer: Self-pay

## 2018-08-16 ENCOUNTER — Inpatient Hospital Stay (HOSPITAL_COMMUNITY)
Admission: EM | Admit: 2018-08-16 | Discharge: 2018-09-05 | DRG: 853 | Disposition: A | Discharge status: Home Health | Payer: Managed Care, Other (non HMO) | Attending: Surgery | Admitting: Surgery

## 2018-08-16 VITALS — BP 138/80 | Temp 98.8°F | Ht 70.0 in | Wt 182.0 lb

## 2018-08-16 DIAGNOSIS — B951 Streptococcus, group B, as the cause of diseases classified elsewhere: Secondary | ICD-10-CM | POA: Diagnosis present

## 2018-08-16 DIAGNOSIS — E119 Type 2 diabetes mellitus without complications: Secondary | ICD-10-CM | POA: Diagnosis not present

## 2018-08-16 DIAGNOSIS — N289 Disorder of kidney and ureter, unspecified: Secondary | ICD-10-CM | POA: Diagnosis present

## 2018-08-16 DIAGNOSIS — I10 Essential (primary) hypertension: Secondary | ICD-10-CM | POA: Insufficient documentation

## 2018-08-16 DIAGNOSIS — L03031 Cellulitis of right toe: Secondary | ICD-10-CM

## 2018-08-16 DIAGNOSIS — E785 Hyperlipidemia, unspecified: Secondary | ICD-10-CM

## 2018-08-16 DIAGNOSIS — N410 Acute prostatitis: Secondary | ICD-10-CM | POA: Insufficient documentation

## 2018-08-16 DIAGNOSIS — R739 Hyperglycemia, unspecified: Secondary | ICD-10-CM | POA: Diagnosis present

## 2018-08-16 DIAGNOSIS — E876 Hypokalemia: Secondary | ICD-10-CM | POA: Diagnosis present

## 2018-08-16 DIAGNOSIS — E1110 Type 2 diabetes mellitus with ketoacidosis without coma: Secondary | ICD-10-CM | POA: Diagnosis present

## 2018-08-16 DIAGNOSIS — E1169 Type 2 diabetes mellitus with other specified complication: Secondary | ICD-10-CM | POA: Diagnosis present

## 2018-08-16 DIAGNOSIS — E871 Hypo-osmolality and hyponatremia: Secondary | ICD-10-CM | POA: Diagnosis present

## 2018-08-16 DIAGNOSIS — L02213 Cutaneous abscess of chest wall: Secondary | ICD-10-CM | POA: Diagnosis present

## 2018-08-16 DIAGNOSIS — A409 Streptococcal sepsis, unspecified: Secondary | ICD-10-CM | POA: Diagnosis not present

## 2018-08-16 DIAGNOSIS — E1122 Type 2 diabetes mellitus with diabetic chronic kidney disease: Secondary | ICD-10-CM | POA: Diagnosis present

## 2018-08-16 DIAGNOSIS — I129 Hypertensive chronic kidney disease with stage 1 through stage 4 chronic kidney disease, or unspecified chronic kidney disease: Secondary | ICD-10-CM | POA: Diagnosis present

## 2018-08-16 DIAGNOSIS — N179 Acute kidney failure, unspecified: Secondary | ICD-10-CM | POA: Diagnosis not present

## 2018-08-16 DIAGNOSIS — Z8249 Family history of ischemic heart disease and other diseases of the circulatory system: Secondary | ICD-10-CM

## 2018-08-16 DIAGNOSIS — B369 Superficial mycosis, unspecified: Secondary | ICD-10-CM | POA: Diagnosis not present

## 2018-08-16 DIAGNOSIS — Z6830 Body mass index (BMI) 30.0-30.9, adult: Secondary | ICD-10-CM

## 2018-08-16 DIAGNOSIS — L97509 Non-pressure chronic ulcer of other part of unspecified foot with unspecified severity: Secondary | ICD-10-CM

## 2018-08-16 DIAGNOSIS — N183 Chronic kidney disease, stage 3 (moderate): Secondary | ICD-10-CM | POA: Diagnosis present

## 2018-08-16 DIAGNOSIS — Z23 Encounter for immunization: Secondary | ICD-10-CM

## 2018-08-16 DIAGNOSIS — R509 Fever, unspecified: Secondary | ICD-10-CM | POA: Diagnosis present

## 2018-08-16 DIAGNOSIS — A419 Sepsis, unspecified organism: Secondary | ICD-10-CM

## 2018-08-16 DIAGNOSIS — R809 Proteinuria, unspecified: Secondary | ICD-10-CM | POA: Diagnosis present

## 2018-08-16 DIAGNOSIS — E669 Obesity, unspecified: Secondary | ICD-10-CM | POA: Diagnosis present

## 2018-08-16 DIAGNOSIS — Z794 Long term (current) use of insulin: Secondary | ICD-10-CM

## 2018-08-16 DIAGNOSIS — Z9119 Patient's noncompliance with other medical treatment and regimen: Secondary | ICD-10-CM

## 2018-08-16 DIAGNOSIS — E11621 Type 2 diabetes mellitus with foot ulcer: Secondary | ICD-10-CM | POA: Diagnosis present

## 2018-08-16 DIAGNOSIS — E11649 Type 2 diabetes mellitus with hypoglycemia without coma: Secondary | ICD-10-CM | POA: Diagnosis present

## 2018-08-16 DIAGNOSIS — E1100 Type 2 diabetes mellitus with hyperosmolarity without nonketotic hyperglycemic-hyperosmolar coma (NKHHC): Secondary | ICD-10-CM | POA: Diagnosis present

## 2018-08-16 DIAGNOSIS — Z91199 Patient's noncompliance with other medical treatment and regimen due to unspecified reason: Secondary | ICD-10-CM

## 2018-08-16 DIAGNOSIS — J853 Abscess of mediastinum: Secondary | ICD-10-CM | POA: Diagnosis present

## 2018-08-16 DIAGNOSIS — L0211 Cutaneous abscess of neck: Secondary | ICD-10-CM | POA: Diagnosis present

## 2018-08-16 DIAGNOSIS — S91101A Unspecified open wound of right great toe without damage to nail, initial encounter: Secondary | ICD-10-CM

## 2018-08-16 DIAGNOSIS — M009 Pyogenic arthritis, unspecified: Secondary | ICD-10-CM

## 2018-08-16 DIAGNOSIS — J90 Pleural effusion, not elsewhere classified: Secondary | ICD-10-CM | POA: Diagnosis present

## 2018-08-16 DIAGNOSIS — Z9114 Patient's other noncompliance with medication regimen: Secondary | ICD-10-CM

## 2018-08-16 DIAGNOSIS — Z79899 Other long term (current) drug therapy: Secondary | ICD-10-CM

## 2018-08-16 DIAGNOSIS — D631 Anemia in chronic kidney disease: Secondary | ICD-10-CM | POA: Diagnosis present

## 2018-08-16 DIAGNOSIS — E860 Dehydration: Secondary | ICD-10-CM | POA: Diagnosis present

## 2018-08-16 DIAGNOSIS — L84 Corns and callosities: Secondary | ICD-10-CM | POA: Diagnosis present

## 2018-08-16 LAB — CBC WITH DIFFERENTIAL/PLATELET
Abs Immature Granulocytes: 1.28 10*3/uL — ABNORMAL HIGH (ref 0.00–0.07)
Basophils Absolute: 0 10*3/uL (ref 0.0–0.1)
Basophils Relative: 0 %
EOS ABS: 0.2 10*3/uL (ref 0.0–0.5)
Eosinophils Relative: 2 %
HCT: 43 % (ref 39.0–52.0)
Hemoglobin: 14.1 g/dL (ref 13.0–17.0)
Immature Granulocytes: 9 %
Lymphocytes Relative: 6 %
Lymphs Abs: 0.9 10*3/uL (ref 0.7–4.0)
MCH: 25.7 pg — ABNORMAL LOW (ref 26.0–34.0)
MCHC: 32.8 g/dL (ref 30.0–36.0)
MCV: 78.3 fL — ABNORMAL LOW (ref 80.0–100.0)
Monocytes Absolute: 1.8 10*3/uL — ABNORMAL HIGH (ref 0.1–1.0)
Monocytes Relative: 13 %
Neutro Abs: 9.6 10*3/uL — ABNORMAL HIGH (ref 1.7–7.7)
Neutrophils Relative %: 70 %
Platelets: 144 10*3/uL — ABNORMAL LOW (ref 150–400)
RBC: 5.49 MIL/uL (ref 4.22–5.81)
RDW: 13.2 % (ref 11.5–15.5)
WBC Morphology: INCREASED
WBC: 13.8 10*3/uL — ABNORMAL HIGH (ref 4.0–10.5)
nRBC: 0 % (ref 0.0–0.2)

## 2018-08-16 LAB — CBG MONITORING, ED
GLUCOSE-CAPILLARY: 383 mg/dL — AB (ref 70–99)
Glucose-Capillary: 432 mg/dL — ABNORMAL HIGH (ref 70–99)
Glucose-Capillary: 427 mg/dL — ABNORMAL HIGH (ref 70–99)
Glucose-Capillary: 341 mg/dL — ABNORMAL HIGH (ref 70–99)
Glucose-Capillary: 348 mg/dL — ABNORMAL HIGH (ref 70–99)
Glucose-Capillary: 301 mg/dL — ABNORMAL HIGH (ref 70–99)

## 2018-08-16 LAB — URINALYSIS, ROUTINE W REFLEX MICROSCOPIC
Bacteria, UA: NONE SEEN
Bilirubin Urine: NEGATIVE
Glucose, UA: >=500 mg/dL — AB
Ketones, ur: 20 mg/dL — AB
Leukocytes, UA: NEGATIVE
Nitrite: NEGATIVE
Protein, ur: 100 mg/dL — AB
Specific Gravity, Urine: 1.016 (ref 1.005–1.030)
pH: 5 (ref 5.0–8.0)

## 2018-08-16 LAB — MAGNESIUM: MAGNESIUM: 3.1 mg/dL — AB (ref 1.7–2.4)

## 2018-08-16 LAB — BASIC METABOLIC PANEL
Anion gap: 15 (ref 5–15)
BUN: 67 mg/dL — ABNORMAL HIGH (ref 6–20)
CHLORIDE: 82 mmol/L — AB (ref 98–111)
CO2: 24 mmol/L (ref 22–32)
Calcium: 8 mg/dL — ABNORMAL LOW (ref 8.9–10.3)
Creatinine, Ser: 2.27 mg/dL — ABNORMAL HIGH (ref 0.61–1.24)
GFR calc Af Amer: 37 mL/min — ABNORMAL LOW (ref >60)
GFR calc non Af Amer: 32 mL/min — ABNORMAL LOW (ref >60)
Glucose, Bld: 432 mg/dL — ABNORMAL HIGH (ref 70–99)
Potassium: 4.1 mmol/L (ref 3.5–5.1)
Sodium: 121 mmol/L — ABNORMAL LOW (ref 135–145)

## 2018-08-16 LAB — PHOSPHORUS: Phosphorus: 3.5 mg/dL (ref 2.5–4.6)

## 2018-08-16 LAB — HEPATIC FUNCTION PANEL
ALK PHOS: 160 U/L — AB (ref 38–126)
ALT: 35 U/L (ref 0–44)
AST: 44 U/L — ABNORMAL HIGH (ref 15–41)
Albumin: 2.3 g/dL — ABNORMAL LOW (ref 3.5–5.0)
Bilirubin, Direct: 0.4 mg/dL — ABNORMAL HIGH (ref 0.0–0.2)
Indirect Bilirubin: 1.5 mg/dL — ABNORMAL HIGH (ref 0.3–0.9)
Total Bilirubin: 1.9 mg/dL — ABNORMAL HIGH (ref 0.3–1.2)
Total Protein: 6.1 g/dL — ABNORMAL LOW (ref 6.5–8.1)

## 2018-08-16 LAB — POCT GLYCOSYLATED HEMOGLOBIN (HGB A1C): Hemoglobin A1C: 13 % — AB (ref 4.0–5.6)

## 2018-08-16 MED ORDER — ACETAMINOPHEN 650 MG RE SUPP: 650.0000 mg | Freq: Four times a day (QID) | RECTAL | Status: DC | PRN

## 2018-08-16 MED ORDER — HEPARIN SODIUM (PORCINE) 5000 UNIT/ML IJ SOLN
5000.0000 [IU] | Freq: Three times a day (TID) | INTRAMUSCULAR | Status: DC
  Administered 2018-08-17 – 2018-08-26 (×29): 5000 [IU] via SUBCUTANEOUS
  Filled 2018-08-16 (×27): qty 1

## 2018-08-16 MED ORDER — SODIUM CHLORIDE 0.9 % IV BOLUS
2000.0000 mL | Freq: Once | INTRAVENOUS | Status: AC
  Administered 2018-08-16: 2000 mL via INTRAVENOUS

## 2018-08-16 MED ORDER — ONDANSETRON HCL 4 MG/2ML IJ SOLN: 4.0000 mg | Freq: Four times a day (QID) | INTRAMUSCULAR | Status: DC | PRN

## 2018-08-16 MED ORDER — SODIUM CHLORIDE 0.9 % IV BOLUS
1000.0000 mL | Freq: Once | INTRAVENOUS | Status: AC
  Administered 2018-08-16: 1000 mL via INTRAVENOUS

## 2018-08-16 MED ORDER — SULFAMETHOXAZOLE-TRIMETHOPRIM 800-160 MG PO TABS: 1.0000 | ORAL_TABLET | Freq: Two times a day (BID) | ORAL | 0 refills | Status: DC

## 2018-08-16 MED ORDER — DEXTROSE-NACL 5-0.45 % IV SOLN
INTRAVENOUS | Status: DC
  Administered 2018-08-17: 02:00:00 via INTRAVENOUS

## 2018-08-16 MED ORDER — INSULIN REGULAR(HUMAN) IN NACL 100-0.9 UT/100ML-% IV SOLN
INTRAVENOUS | Status: DC
  Administered 2018-08-16: 3.2 [IU]/h via INTRAVENOUS
  Filled 2018-08-16 (×2): qty 100

## 2018-08-16 MED ORDER — LABETALOL HCL 5 MG/ML IV SOLN
10.0000 mg | INTRAVENOUS | Status: DC | PRN
  Administered 2018-08-17 – 2018-09-02 (×22): 10 mg via INTRAVENOUS
  Filled 2018-08-16 (×20): qty 4

## 2018-08-16 MED ORDER — ACETAMINOPHEN 325 MG PO TABS
650.0000 mg | ORAL_TABLET | Freq: Four times a day (QID) | ORAL | Status: DC | PRN
  Administered 2018-08-17 – 2018-09-04 (×13): 650 mg via ORAL
  Filled 2018-08-16 (×13): qty 2

## 2018-08-16 MED ORDER — INSULIN GLARGINE 100 UNIT/ML ~~LOC~~ SOLN: 30.0000 [IU] | Freq: Every day | SUBCUTANEOUS | 1 refills | Status: DC

## 2018-08-16 MED ORDER — ONDANSETRON HCL 4 MG PO TABS: 4.0000 mg | ORAL_TABLET | Freq: Four times a day (QID) | ORAL | Status: DC | PRN

## 2018-08-16 MED ORDER — INSULIN LISPRO 100 UNIT/ML CARTRIDGE: 8.0000 [IU] | Freq: Three times a day (TID) | SUBCUTANEOUS | 1 refills | Status: DC

## 2018-08-16 NOTE — Discharge Instructions (Addendum)
Discharge Instructions:  1. You may sponge bath, do not take a shower, or swim 2. Activity- up as tolerated, avoid heavy lifting, please walk at least 3 times per day 3. If you develop fever of greater 102, please contact ID office or our office at [phone] 4. Do not drive if taking narcotic pain medication 5. Diet- diabetic diet, please keep good blood glucose control, sugar goal will be less than 200 6. Please contact office if you have any questions concerns arise at [phone]

## 2018-08-16 NOTE — ED Provider Notes (Signed)
[facility] EMERGENCY DEPARTMENT Provider Note   CSN: [id number] Arrival date & time: 08/16/18  1438     History   Chief Complaint No chief complaint on file.   HPI Randy Oneal is a 53 y.o. male.  HPI   He is here for evaluation of elevated blood sugar and dehydration.  He saw his PCP today today, to follow-up on a case of prostatitis, for which she is currently receiving Cipro.  Labs were repeated and he was sent here for treatment, after they returned.  He was off his insulin for about a year and then has started back and taking it sporadically.  He has a sore on his right great toe which is been hurting recently.  There is a callus, on it, and he has been trying to "cut it off."  He denies fever, chills, nausea, vomiting, focal weakness or paresthesia.  There are no other known modifying factors.  Past Medical History:  Diagnosis Date  . Diabetes mellitus    Insulin dependent  . Hypertension   . Renal insufficiency     Patient Active Problem List   Diagnosis Date Noted  . AKI (acute kidney injury) ([place]) 08/16/2018  . Hyperlipidemia associated with type 2 diabetes mellitus ([place]) 03/04/2017  . Erectile dysfunction 03/04/2017  . Personal history of noncompliance with medical treatment, presenting hazards to health 11/22/2015  . HTN (hypertension), benign 01/30/2014  . Loss of weight 01/30/2014  . Type 2 diabetes mellitus not at goal [facility]) 03/30/2013    Past Surgical History:  Procedure Laterality Date  . COLONOSCOPY  06/24/2011   Procedure: COLONOSCOPY;  Surgeon: [name], MD;  Location: [facility];  Service: Endoscopy;  Laterality: N/A;  8:30 AM        Home Medications    Prior to Admission medications   Medication Sig Start Date End Date Taking? Authorizing Provider  acetaminophen (TYLENOL) 500 MG tablet Take 1,000 mg by mouth every 6 (six) hours as needed for mild pain or moderate pain.   Yes [provider]  amLODipine (NORVASC) 10 MG  tablet Take 1 tablet (10 mg total) by mouth daily. 01/26/18  Yes [name], MD  atorvastatin (LIPITOR) 10 MG tablet Take 1 tablet (10 mg total) by mouth daily. 01/26/18  Yes [name], MD  ibuprofen (ADVIL,MOTRIN) 200 MG tablet Take 400 mg by mouth every 6 (six) hours as needed for mild pain or moderate pain.   Yes [provider]  Ibuprofen-diphenhydrAMINE Cit (ADVIL PM) 200-38 MG TABS Take 2 tablets by mouth at bedtime as needed (for pain/sleep).   Yes [provider]  lisinopril (PRINIVIL,ZESTRIL) 20 MG tablet Take 1 tablet (20 mg total) by mouth daily. 01/26/18  Yes [name], MD  sildenafil (REVATIO) 20 MG tablet May take up to 5 before relations as directed 03/04/17  Yes Luking, [name], MD  Continuous Blood Gluc Sensor ([place]) MISC Use one sensor every 10 days. 08/10/17   [name], MD  glucose blood (BAYER CONTOUR NEXT TEST) test strip USE TO TEST FOUR TIMES DAILY. 07/28/17   [name], MD  insulin glargine (LANTUS) 100 UNIT/ML injection Inject 0.3 mLs (30 Units total) into the skin at bedtime. 08/16/18   [name], MD  insulin lispro (HUMALOG) 100 UNIT/ML cartridge Inject 0.08 mLs (8 Units total) into the skin 3 (three) times daily with meals. 08/16/18   [name], MD  Insulin Pen Needle (B-D ULTRAFINE  One[place] SHORT PEN) 31G X 8 MM MISC 1 each by Does not apply route as directed. 12/07/15   [name], MD  MICROLET LANCETS MISC USE FOUR TIMES A DAY AS DIRECTED. 11/19/16   [name], [name], MD  NOVOFINE 32G X 6 MM MISC USE AS DIRECTED WITH INSULIN PEN FOUR TIMES DAILY. 11/20/14   [name], MD  sulfamethoxazole-trimethoprim (BACTRIM DS,SEPTRA DS) 800-160 MG tablet Take 1 tablet by mouth 2 (two) times daily. 08/16/18   [name], MD    Family History Family History  Problem Relation Age of Onset  . Hypertension Mother   . Colon cancer Neg Hx     Social History Social History    Tobacco Use  . Smoking status: Never Smoker  . Smokeless tobacco: Never Used  Substance Use Topics  . Alcohol use: No  . Drug use: No     Allergies   Patient has no known allergies.   Review of Systems Review of Systems  All other systems reviewed and are negative.    Physical Exam Updated Vital Signs BP (!) 175/109   Pulse (!) 116   Temp 99.6 F (37.6 C) (Oral)   Resp 15   Ht 5\' 10"  (1.778 m)   Wt 82.6 kg   SpO2 95%   BMI 26.11 kg/m   Physical Exam Vitals signs and nursing note reviewed.  Constitutional:      Appearance: He is well-developed.  HENT:     Head: Normocephalic and atraumatic.     Right Ear: External ear normal.     Left Ear: External ear normal.  Eyes:     Conjunctiva/sclera: Conjunctivae normal.     Pupils: Pupils are equal, round, and reactive to light.  Neck:     Musculoskeletal: Normal range of motion and neck supple.     Trachea: Phonation normal.  Cardiovascular:     Rate and Rhythm: Normal rate and regular rhythm.     Heart sounds: Normal heart sounds.  Pulmonary:     Effort: Pulmonary effort is normal.     Breath sounds: Normal breath sounds.  Abdominal:     Palpations: Abdomen is soft.     Tenderness: There is no abdominal tenderness.  Musculoskeletal: Normal range of motion.  Skin:    General: Skin is warm and dry.     Comments: Callus right great toe, partially removed, from what appears to be a attempt at removal, with mild erythema associated with the wound, but no fluctuance, drainage or streaking associated.  The callus and wound are slightly tender to palpation.  Neurological:     Mental Status: He is alert and oriented to person, place, and time.     Cranial Nerves: No cranial nerve deficit.     Sensory: No sensory deficit.     Motor: No abnormal muscle tone.     Coordination: Coordination normal.  Psychiatric:        Behavior: Behavior normal.        Thought Content: Thought content normal.        Judgment:  Judgment normal.      ED Treatments / Results  Labs (all labs ordered are listed, but only abnormal results are displayed) Labs Reviewed  URINALYSIS, ROUTINE W REFLEX MICROSCOPIC - Abnormal; Notable for the following components:      Result Value   APPearance HAZY (*)    Glucose, UA >=500 (*)    Hgb urine dipstick LARGE (*)    Ketones, ur  20 (*)    Protein, ur 100 (*)    All other components within normal limits  CBG MONITORING, ED - Abnormal; Notable for the following components:   Glucose-Capillary 432 (*)    All other components within normal limits  CBG MONITORING, ED - Abnormal; Notable for the following components:   Glucose-Capillary 427 (*)    All other components within normal limits  CBG MONITORING, ED - Abnormal; Notable for the following components:   Glucose-Capillary 383 (*)    All other components within normal limits  CBG MONITORING, ED - Abnormal; Notable for the following components:   Glucose-Capillary 341 (*)    All other components within normal limits  CBG MONITORING, ED - Abnormal; Notable for the following components:   Glucose-Capillary 348 (*)    All other components within normal limits  CBG MONITORING, ED - Abnormal; Notable for the following components:   Glucose-Capillary 301 (*)    All other components within normal limits   Glucose  Date Value Ref Range Status  02/04/2018 522 (HH) 65 - 99 mg/dL Final    Comment:    **Verified by repeat analysis**  10/30/2016 280 (H) 65 - 99 mg/dL Final   Glucose, Bld  Date Value Ref Range Status  08/16/2018 432 (H) 70 - 99 mg/dL Final  11/05/2015 361 (H) 65 - 99 mg/dL Final  04/25/2014 429 (H) 70 - 99 mg/dL Final  03/18/2014 432 (H) 70 - 99 mg/dL Final   BUN  Date Value Ref Range Status  08/16/2018 67 (H) 6 - 20 mg/dL Final  02/04/2018 24 6 - 24 mg/dL Final  10/30/2016 19 6 - 24 mg/dL Final  11/05/2015 19 7 - 25 mg/dL Final  04/25/2014 17 6 - 23 mg/dL Final  03/18/2014 15 6 - 23 mg/dL Final    Creat  Date Value Ref Range Status  11/05/2015 1.25 0.60 - 1.35 mg/dL Final  03/18/2014 1.26 0.50 - 1.35 mg/dL Final   Creatinine, Ser  Date Value Ref Range Status  08/16/2018 2.27 (H) 0.61 - 1.24 mg/dL Final  02/04/2018 1.67 (H) 0.76 - 1.27 mg/dL Final  10/30/2016 1.34 (H) 0.76 - 1.27 mg/dL Final  04/25/2014 1.16 0.50 - 1.35 mg/dL Final   Sodium  Date Value Ref Range Status  08/16/2018 121 (L) 135 - 145 mmol/L Final  02/04/2018 135 134 - 144 mmol/L Final  10/30/2016 135 134 - 144 mmol/L Final  11/05/2015 136 135 - 146 mmol/L Final  04/25/2014 134 (L) 137 - 147 mEq/L Final  03/18/2014 133 (L) 135 - 145 mEq/L Final    EKG None  Radiology No results found.  Procedures .Critical Care Performed by: [name], MD Authorized by: [name], MD   Critical care provider statement:    Critical care time (minutes):  35   Critical care start time:  08/16/2018 4:30 PM   Critical care end time:  08/16/2018 10:55 PM   Critical care time was exclusive of:  Separately billable procedures and treating other patients   Critical care was necessary to treat or prevent imminent or life-threatening deterioration of the following conditions:  Metabolic crisis   Critical care was time spent personally by me on the following activities:  Blood draw for specimens, development of treatment plan with patient or surrogate, discussions with consultants, evaluation of patient's response to treatment, examination of patient, obtaining history from patient or surrogate, ordering and performing treatments and interventions, ordering and review of laboratory studies, pulse oximetry, re-evaluation of patient's condition,  review of old charts and ordering and review of radiographic studies   (including critical care time)  Medications Ordered in ED Medications  dextrose 5 %-0.45 % sodium chloride infusion (has no administration in time range)  insulin regular, human (MYXREDLIN) 100 units/ 100 mL  infusion (8.6 Units/hr Intravenous Rate/Dose Change 08/16/18 2219)  sodium chloride 0.9 % bolus 2,000 mL (0 mLs Intravenous Stopped 08/16/18 2001)     Initial Impression / Assessment and Plan / ED Course  I have reviewed the triage vital signs and the nursing notes.  Pertinent labs & imaging results that were available during my care of the patient were reviewed by me and considered in my medical decision making (see chart for details).  Clinical Course as of Aug 16 2302  Mon Aug 16, 2018  1922 Delay of treatment, slow IV fluid infusion secondary to patient's arm being bent.   [EW]  2137 Findings and current plan discussed with family members.  Patient continues to state that he has not been taking his insulin regularly and feels like he is not following his low carbohydrate diet.   [EW]    Clinical Course User Index [EW] [name], MD     Patient Vitals for the past 24 hrs:  BP Temp Temp src Pulse Resp SpO2 Height Weight  08/16/18 2300 (!) 175/109 - - (!) 116 - - - -  08/16/18 2230 (!) 181/104 - - (!) 115 - - - -  08/16/18 2200 (!) 174/96 - - (!) 112 - - - -  08/16/18 2130 133/67 - - (!) 109 - - - -  08/16/18 2100 (!) 168/94 - - (!) 107 - - - -  08/16/18 2030 (!) 181/96 - - (!) 106 - - - -  08/16/18 1835 (!) 166/91 - - (!) 111 15 95 % - -  08/16/18 1502 (!) 136/104 99.6 F (37.6 C) Oral (!) 114 18 99 % 5\' 10"  (1.778 m) 82.6 kg    10:56 PM Reevaluation with update and discussion. After initial assessment and treatment, an updated evaluation reveals he remains fairly comfortable.  He has been able to drink some fluids.  His wife does not feel comfortable having him go home at this time.  Findings discussed and questions answered. [name]   Medical Decision Making: Hyperglycemia related to medication noncompliance and poor diet.  Wound right toe.  Possible prostatitis.  Patient not ketotic.  Somewhat worse renal function today possibly related to dehydration plus use of  Cipro.  Decreased sodium likely secondary to pseudohyponatremia, from hyperglycemia.  Patient has been noncompliant with usual prescribed treatments, essentially by choice.  Family members with the patient states that they will help him get medication.  Patient has been able to tolerate oral fluids, and received insulin, with improvement of blood glucose.  He is nontoxic here.  He is drinking well.  His insulin types were changed to cheaper and more preferred medications for his insurance plan.  Family members are committed to have him get his prescriptions filled.  However, they did not feel comfortable taking him home.  Hospitalist contacted to admit.   CRITICAL CARE-yes Performed by: [name]  Nursing Notes Reviewed/ Care Coordinated Applicable Imaging Reviewed Interpretation of Laboratory Data incorporated into ED treatment  10:57 PM-Consult complete with hospitalist. Patient case explained and discussed.  He agrees to admit patient for further evaluation and treatment. Call ended at 11:05 PM  Plan-admit  Final Clinical Impressions(s) / ED Diagnoses   Final diagnoses:  Hyperglycemia  Open wound of right great toe, initial encounter    ED Discharge Orders         Ordered    insulin lispro (HUMALOG) 100 UNIT/ML cartridge  3 times daily with meals     08/16/18 2215    insulin glargine (LANTUS) 100 UNIT/ML injection  Daily at bedtime     08/16/18 2215    sulfamethoxazole-trimethoprim (BACTRIM DS,SEPTRA DS) 800-160 MG tablet  2 times daily     08/16/18 2215           [name], MD 08/16/18 2305

## 2018-08-16 NOTE — ED Triage Notes (Addendum)
Sent from Dr Malachy Moan office for hyperglycemia and dehydration.  Pt c/o lower back pain since Saturday night.  Denies urinary s/s

## 2018-08-16 NOTE — Progress Notes (Signed)
Subjective:    Patient ID: Randy Oneal, male    DOB: [birth year], 53 y.o.   MRN: [id number]  HPI  Patient is here today per the system for his six month follow up,but pt states he is here today due to right foot discolored and painful after trying to cut a callus of the side of the great toe.  Patient has appointment with podiatry for tomorrow  He also states his left foot is also painful due to the callus protruding out.  He states he has been running a constant fever,having chills, he feels week.  Having fever chills was seen last week diagnosed with acute prostatitis unable to tolerate his antibiotic because of nausea and vomiting  No appetite, has only ate jello over the last few days. He also has been vomiting off and on for the last week.   He also has some bilateral ear stopped up.  He states his blood sugars have been high. He sees Dr. Rolanda Lundborg has not seen him 08/10/2017.  Patient would like to have a referral back to a different endocrinologist   Also per son he has been complaining of a painful knot center left in back.   Review of Systems  Constitutional: Negative for diaphoresis and fatigue.  HENT: Negative for congestion and rhinorrhea.   Respiratory: Negative for cough and shortness of breath.   Cardiovascular: Negative for chest pain and leg swelling.  Gastrointestinal: Negative for abdominal pain and diarrhea.  Skin: Negative for color change and rash.  Neurological: Negative for dizziness and headaches.  Psychiatric/Behavioral: Negative for behavioral problems and confusion.       Results for orders placed or performed in visit on 08/16/18  POCT glycosylated hemoglobin (Hb A1C)  Result Value Ref Range   Hemoglobin A1C 13.0 (A) 4.0 - 5.6 %   HbA1c POC (<> result, manual entry)     HbA1c, POC (prediabetic range)     HbA1c, POC (controlled diabetic range)      Objective:   Physical Exam Vitals signs reviewed.  Constitutional:      General: He is not  in acute distress. HENT:     Head: Normocephalic and atraumatic.  Eyes:     General:        Right eye: No discharge.        Left eye: No discharge.  Neck:     Trachea: No tracheal deviation.  Cardiovascular:     Rate and Rhythm: Normal rate and regular rhythm.     Heart sounds: Normal heart sounds. No murmur.  Pulmonary:     Effort: Pulmonary effort is normal. No respiratory distress.     Breath sounds: Normal breath sounds.  Lymphadenopathy:     Cervical: No cervical adenopathy.  Skin:    General: Skin is warm and dry.  Neurological:     Mental Status: He is alert.     Coordination: Coordination normal.  Psychiatric:        Behavior: Behavior normal.    Should be noted that I had a discussion with the patient in his son how the patient needs to do a better job of taking care of himself.  He is willing to see a different endocrinology.  I have advised the patient that there is risk he will not live a long life if he continues on the path he is on.       Assessment & Plan:  Recent prostatitis will need to be on a different antibiotic  Nausea vomiting could be related to his severe diabetes issues stat lab work indicated await the results  Referral to a new endocrinologist for his diabetes would be beneficial  25 minutes was spent with the patient.  This statement verifies that 25 minutes was indeed spent with the patient.  More than 50% of this visit-total duration of the visit-was spent in counseling and coordination of care. The issues that the patient came in for today as reflected in the diagnosis (s) please refer to documentation for further details.  Addendum-lab work came back showing significant hyperglycemia as well as acute rise of creatinine probably related to relative dehydration.  It is recommended for the patient to be seen in the ER for further evaluation and possible admission

## 2018-08-16 NOTE — H&P (Signed)
History and Physical    Randy Oneal ENI:[id number] DOB: 1965/11/09 DOA: 08/16/2018  PCP: [name], MD   Patient coming from: Home.   I have personally briefly reviewed patient's old medical records in [place]  Chief Complaint: Hyperglycemia.  HPI: Randy Oneal is a 53 y.o. male with medical history significant of type 2 diabetes, hypertension, renal insufficiency who is coming to the emergency department referred by his PCP Dr. Wolfgang Phoenix due to dehydration and uncontrolled diabetes.  He is unable to provide further history as he is only oriented to name.  He follows simple commands, but quickly forgets while he is doing.  I spoke to his wife Randy Oneal who stated the patient has been having polyuria, polydipsia for a while.  She also stated that he has some visual hallucinations over the past 2 days.  To her knowledge, he has not had head trauma, fever, chills, dyspnea, cough, chest pain, abdominal pain, vomiting or diarrhea.  No further history is available at this time.  ED Course: Initial vital signs temperature 99.6 F, pulse 114, respirations 18, blood pressure 136/104 mmHg with an O2 sat of 99% on room air.  The patient received 2000 mL of normal saline bolus and was placed on a continuous insulin infusion.  His urinalysis shows glucosuria more than 500, ketonuria of 20 and proteinuria 100 mg/dL.  All other values are unremarkable.  BMP shows sodium 121, potassium 4.1, chloride 82 and CO2 24 with an anion gap of 15.  Glucose was 432, BUN 67, creatinine 2.27 and calcium 8.0 mg/dL.  LFTs showed total protein of 6.1, albumin of 2.3 g/dL.  Total bilirubin was 1.9 and direct bilirubin 0.4 mg/dL.  AST and alkaline phosphatase were mildly elevated 44 and 160 units/L respectively.  Review of Systems: As per HPI otherwise 10 point review of systems negative.   Past Medical History:  Diagnosis Date  . Diabetes mellitus    Insulin dependent  . Hypertension   . Renal insufficiency      Past Surgical History:  Procedure Laterality Date  . COLONOSCOPY  06/24/2011   Procedure: COLONOSCOPY;  Surgeon: [name], MD;  Location: [facility];  Service: Endoscopy;  Laterality: N/A;  8:30 AM     reports that he has never smoked. He has never used smokeless tobacco. He reports that he does not drink alcohol or use drugs.  No Known Allergies  Family History  Problem Relation Age of Onset  . Hypertension Mother   . Colon cancer Neg Hx    Prior to Admission medications   Medication Sig Start Date End Date Taking? Authorizing Provider  acetaminophen (TYLENOL) 500 MG tablet Take 1,000 mg by mouth every 6 (six) hours as needed for mild pain or moderate pain.   Yes [provider]  amLODipine (NORVASC) 10 MG tablet Take 1 tablet (10 mg total) by mouth daily. 01/26/18  Yes [name], MD  atorvastatin (LIPITOR) 10 MG tablet Take 1 tablet (10 mg total) by mouth daily. 01/26/18  Yes [name], MD  ibuprofen (ADVIL,MOTRIN) 200 MG tablet Take 400 mg by mouth every 6 (six) hours as needed for mild pain or moderate pain.   Yes [provider]  Ibuprofen-diphenhydrAMINE Cit (ADVIL PM) 200-38 MG TABS Take 2 tablets by mouth at bedtime as needed (for pain/sleep).   Yes [provider]  lisinopril (PRINIVIL,ZESTRIL) 20 MG tablet Take 1 tablet (20 mg total) by mouth daily. 01/26/18  Yes Heppner,  [name], MD  sildenafil (REVATIO) 20 MG tablet May take up to 5 before relations as directed 03/04/17  Yes Luking, [name], MD  Continuous Blood Gluc Sensor ([place]) MISC Use one sensor every 10 days. 08/10/17   [name], MD  glucose blood (BAYER CONTOUR NEXT TEST) test strip USE TO TEST FOUR TIMES DAILY. 07/28/17   [name], MD  insulin glargine (LANTUS) 100 UNIT/ML injection Inject 0.3 mLs (30 Units total) into the skin at bedtime. 08/16/18   [name], MD  insulin lispro (HUMALOG) 100 UNIT/ML cartridge Inject  0.08 mLs (8 Units total) into the skin 3 (three) times daily with meals. 08/16/18   [name], MD  Insulin Pen Needle (B-D ULTRAFINE Oneal SHORT PEN) 31G X 8 MM MISC 1 each by Does not apply route as directed. 12/07/15   [name], MD  MICROLET LANCETS MISC USE FOUR TIMES A DAY AS DIRECTED. 11/19/16   [name], [name], MD  NOVOFINE 32G X 6 MM MISC USE AS DIRECTED WITH INSULIN PEN FOUR TIMES DAILY. 11/20/14   [name], MD  sulfamethoxazole-trimethoprim (BACTRIM DS,SEPTRA DS) 800-160 MG tablet Take 1 tablet by mouth 2 (two) times daily. 08/16/18   [name], MD    Physical Exam: Vitals:   08/16/18 2130 08/16/18 2200 08/16/18 2230 08/16/18 2300  BP: 133/67 (!) 174/96 (!) 181/104 (!) 175/109  Pulse: (!) 109 (!) 112 (!) 115 (!) 116  Resp:      Temp:      TempSrc:      SpO2:      Weight:      Height:        Constitutional: Confused, but in NAD. Eyes: PERRL, lids and conjunctivae normal ENMT: Mucous membranes are dry.  Posterior pharynx clear of any exudate or lesions. Neck: normal, supple, no masses, no thyromegaly Respiratory: clear to auscultation bilaterally, no wheezing, no crackles. Normal respiratory effort. No accessory muscle use.  Cardiovascular: Tachycardic at 114 bpm, no murmurs / rubs / gallops. No extremity edema. 2+ pedal pulses. No carotid bruits.  Abdomen: Soft, no tenderness, no masses palpated. No hepatosplenomegaly. Bowel sounds positive.  Musculoskeletal: no clubbing / cyanosis.  Good ROM, no contractures. Normal muscle tone.  Skin: no rashes, lesions, ulcers. No induration on limited dermatological exam. Neurologic: CN 2-12 grossly intact. Sensation intact, DTR normal.  Generalized weakness, but grossly nonfocal. Psychiatric: Oriented only to self.  Disoriented to place, time and situation.  Labs on Admission: I have personally reviewed following labs and imaging studies  CBC: Recent Labs  Lab 08/16/18 1135  WBC 13.8*  NEUTROABS 9.6*    HGB 14.1  HCT 43.0  MCV 78.3*  PLT 166*   Basic Metabolic Panel: Recent Labs  Lab 08/16/18 1135  NA 121*  K 4.1  CL 82*  CO2 24  GLUCOSE 432*  BUN 67*  CREATININE 2.27*  CALCIUM 8.0*   GFR: Estimated Creatinine Clearance: 39.3 mL/min (A) (by C-G formula based on SCr of 2.27 mg/dL (H)). Liver Function Tests: Recent Labs  Lab 08/16/18 1135  AST 44*  ALT 35  ALKPHOS 160*  BILITOT 1.9*  PROT 6.1*  ALBUMIN 2.3*   No results for input(s): LIPASE, AMYLASE in the last 168 hours. No results for input(s): AMMONIA in the last 168 hours. Coagulation Profile: No results for input(s): INR, PROTIME in the last 168 hours. Cardiac Enzymes: No results for input(s): CKTOTAL, CKMB, CKMBINDEX, TROPONINI in the last 168 hours. BNP (  last 3 results) No results for input(s): PROBNP in the last 8760 hours. HbA1C: Recent Labs    08/16/18 0933  HGBA1C 13.0*   CBG: Recent Labs  Lab 08/16/18 1827 08/16/18 1959 08/16/18 2110 08/16/18 2218 08/16/18 2256  GLUCAP 427* 383* 341* 348* 301*   Lipid Profile: No results for input(s): CHOL, HDL, LDLCALC, TRIG, CHOLHDL, LDLDIRECT in the last 72 hours. Thyroid Function Tests: No results for input(s): TSH, T4TOTAL, FREET4, T3FREE, THYROIDAB in the last 72 hours. Anemia Panel: No results for input(s): VITAMINB12, FOLATE, FERRITIN, TIBC, IRON, RETICCTPCT in the last 72 hours. Urine analysis:    Component Value Date/Time   COLORURINE YELLOW 08/16/2018 1605   APPEARANCEUR HAZY (A) 08/16/2018 1605   LABSPEC 1.016 08/16/2018 1605   PHURINE 5.0 08/16/2018 1605   GLUCOSEU >=500 (A) 08/16/2018 1605   HGBUR LARGE (A) 08/16/2018 [place] 08/16/2018 1605   KETONESUR 20 (A) 08/16/2018 1605   PROTEINUR 100 (A) 08/16/2018 1605   NITRITE NEGATIVE 08/16/2018 1605   LEUKOCYTESUR NEGATIVE 08/16/2018 1605    Radiological Exams on Admission: No results found.  EKG: Independently reviewed.    Assessment/Plan Principal  Problem:   AKI (acute kidney injury) ([place]) Observation/telemetry. Continue IV fluids. Continue treating hyperglycemia. Monitor intake and output. Follow-up renal function and electrolytes.  Active Problems:   Diabetic hyperosmolar non-ketotic state ([place])   Type 2 diabetes mellitus not at goal [facility]) The patient was confused when interviewed.  He urinated on himself.  The patient's wife states that he was hallucinating at home, but did not specify exactly what he said he was seeing.  CT head was negative.  Continue fluid resuscitation and insulin infusion with close CBG monitoring.  Correct electrolytes as needed.  Resume long acting insulin after infusion is discontinued.    HTN (hypertension), benign Not on medical therapy. Due to AKI will hold lisinopril. Labetalol IV as needed for hypertension.  Personal history of noncompliance with medical treatment, presenting hazards to health Unable to address at this time. To be discussed once the patient is less confused.    Hyperlipidemia associated with type 2 diabetes mellitus [facility]) Not on medical therapy. She will follow-up with PCP.    Hyponatremia Partially due to hyperglycemia induced pseudo-hyponatremia. However, corrected sodium is Continue NS infusion. Consider further work-up if no improvement.   DVT prophylaxis: SQ heparin. Code Status: Full code. Family Communication: I spoke with the patient's wife at [phone] Disposition Plan: Observation for insulin infusion and IV hydration. Consults called: Admission status: Observation/stepdown.   Reubin Milan MD [facility]  If 7PM-7AM, please contact night-coverage [email or web address] Password [facility]  08/16/2018, 11:44 PM

## 2018-08-17 ENCOUNTER — Inpatient Hospital Stay (HOSPITAL_COMMUNITY): Payer: Managed Care, Other (non HMO)

## 2018-08-17 ENCOUNTER — Encounter (HOSPITAL_COMMUNITY): Payer: Self-pay | Admitting: Internal Medicine

## 2018-08-17 ENCOUNTER — Observation Stay (HOSPITAL_COMMUNITY): Payer: Managed Care, Other (non HMO)

## 2018-08-17 ENCOUNTER — Other Ambulatory Visit: Payer: Self-pay

## 2018-08-17 ENCOUNTER — Encounter: Payer: Self-pay | Admitting: Family Medicine

## 2018-08-17 DIAGNOSIS — E119 Type 2 diabetes mellitus without complications: Secondary | ICD-10-CM | POA: Diagnosis not present

## 2018-08-17 DIAGNOSIS — N179 Acute kidney failure, unspecified: Secondary | ICD-10-CM | POA: Diagnosis not present

## 2018-08-17 DIAGNOSIS — I10 Essential (primary) hypertension: Secondary | ICD-10-CM | POA: Diagnosis not present

## 2018-08-17 DIAGNOSIS — M009 Pyogenic arthritis, unspecified: Secondary | ICD-10-CM | POA: Diagnosis present

## 2018-08-17 DIAGNOSIS — J853 Abscess of mediastinum: Secondary | ICD-10-CM | POA: Diagnosis present

## 2018-08-17 DIAGNOSIS — M7989 Other specified soft tissue disorders: Secondary | ICD-10-CM | POA: Diagnosis not present

## 2018-08-17 DIAGNOSIS — A409 Streptococcal sepsis, unspecified: Secondary | ICD-10-CM | POA: Diagnosis present

## 2018-08-17 DIAGNOSIS — J90 Pleural effusion, not elsewhere classified: Secondary | ICD-10-CM | POA: Diagnosis present

## 2018-08-17 DIAGNOSIS — L03031 Cellulitis of right toe: Secondary | ICD-10-CM | POA: Diagnosis not present

## 2018-08-17 DIAGNOSIS — E1100 Type 2 diabetes mellitus with hyperosmolarity without nonketotic hyperglycemic-hyperosmolar coma (NKHHC): Secondary | ICD-10-CM | POA: Diagnosis present

## 2018-08-17 DIAGNOSIS — E11649 Type 2 diabetes mellitus with hypoglycemia without coma: Secondary | ICD-10-CM | POA: Diagnosis present

## 2018-08-17 DIAGNOSIS — E876 Hypokalemia: Secondary | ICD-10-CM | POA: Diagnosis present

## 2018-08-17 DIAGNOSIS — R739 Hyperglycemia, unspecified: Secondary | ICD-10-CM | POA: Diagnosis present

## 2018-08-17 DIAGNOSIS — L0291 Cutaneous abscess, unspecified: Secondary | ICD-10-CM | POA: Diagnosis not present

## 2018-08-17 DIAGNOSIS — B951 Streptococcus, group B, as the cause of diseases classified elsewhere: Secondary | ICD-10-CM | POA: Diagnosis not present

## 2018-08-17 DIAGNOSIS — E785 Hyperlipidemia, unspecified: Secondary | ICD-10-CM | POA: Diagnosis present

## 2018-08-17 DIAGNOSIS — E11621 Type 2 diabetes mellitus with foot ulcer: Secondary | ICD-10-CM | POA: Diagnosis present

## 2018-08-17 DIAGNOSIS — Z23 Encounter for immunization: Secondary | ICD-10-CM | POA: Diagnosis not present

## 2018-08-17 DIAGNOSIS — L03313 Cellulitis of chest wall: Secondary | ICD-10-CM | POA: Diagnosis not present

## 2018-08-17 DIAGNOSIS — N183 Chronic kidney disease, stage 3 (moderate): Secondary | ICD-10-CM | POA: Diagnosis present

## 2018-08-17 DIAGNOSIS — L02213 Cutaneous abscess of chest wall: Secondary | ICD-10-CM | POA: Diagnosis present

## 2018-08-17 DIAGNOSIS — E1110 Type 2 diabetes mellitus with ketoacidosis without coma: Secondary | ICD-10-CM | POA: Diagnosis present

## 2018-08-17 DIAGNOSIS — E860 Dehydration: Secondary | ICD-10-CM | POA: Diagnosis present

## 2018-08-17 DIAGNOSIS — E871 Hypo-osmolality and hyponatremia: Secondary | ICD-10-CM | POA: Diagnosis present

## 2018-08-17 DIAGNOSIS — B9689 Other specified bacterial agents as the cause of diseases classified elsewhere: Secondary | ICD-10-CM | POA: Diagnosis not present

## 2018-08-17 DIAGNOSIS — R809 Proteinuria, unspecified: Secondary | ICD-10-CM | POA: Diagnosis present

## 2018-08-17 DIAGNOSIS — N289 Disorder of kidney and ureter, unspecified: Secondary | ICD-10-CM | POA: Diagnosis present

## 2018-08-17 DIAGNOSIS — M00212 Other streptococcal arthritis, left shoulder: Secondary | ICD-10-CM | POA: Diagnosis not present

## 2018-08-17 DIAGNOSIS — E1169 Type 2 diabetes mellitus with other specified complication: Secondary | ICD-10-CM | POA: Diagnosis present

## 2018-08-17 DIAGNOSIS — R7881 Bacteremia: Secondary | ICD-10-CM | POA: Diagnosis not present

## 2018-08-17 DIAGNOSIS — L0211 Cutaneous abscess of neck: Secondary | ICD-10-CM | POA: Diagnosis present

## 2018-08-17 DIAGNOSIS — M00812 Arthritis due to other bacteria, left shoulder: Secondary | ICD-10-CM | POA: Diagnosis not present

## 2018-08-17 DIAGNOSIS — I129 Hypertensive chronic kidney disease with stage 1 through stage 4 chronic kidney disease, or unspecified chronic kidney disease: Secondary | ICD-10-CM | POA: Diagnosis present

## 2018-08-17 DIAGNOSIS — E1122 Type 2 diabetes mellitus with diabetic chronic kidney disease: Secondary | ICD-10-CM | POA: Diagnosis present

## 2018-08-17 DIAGNOSIS — L84 Corns and callosities: Secondary | ICD-10-CM | POA: Diagnosis present

## 2018-08-17 LAB — COMPREHENSIVE METABOLIC PANEL
ALT: 26 U/L (ref 0–44)
ALT: 45 U/L — ABNORMAL HIGH (ref 0–44)
AST: 40 U/L (ref 15–41)
AST: 89 U/L — ABNORMAL HIGH (ref 15–41)
Albumin: 1.7 g/dL — ABNORMAL LOW (ref 3.5–5.0)
Albumin: 1.6 g/dL — ABNORMAL LOW (ref 3.5–5.0)
Alkaline Phosphatase: 126 U/L (ref 38–126)
Alkaline Phosphatase: 171 U/L — ABNORMAL HIGH (ref 38–126)
Anion gap: 8 (ref 5–15)
Anion gap: 8 (ref 5–15)
BILIRUBIN TOTAL: 1.2 mg/dL (ref 0.3–1.2)
BUN: 63 mg/dL — ABNORMAL HIGH (ref 6–20)
BUN: 56 mg/dL — ABNORMAL HIGH (ref 6–20)
CHLORIDE: 94 mmol/L — AB (ref 98–111)
CO2: 24 mmol/L (ref 22–32)
CO2: 23 mmol/L (ref 22–32)
Calcium: 7.2 mg/dL — ABNORMAL LOW (ref 8.9–10.3)
Calcium: 7.3 mg/dL — ABNORMAL LOW (ref 8.9–10.3)
Chloride: 97 mmol/L — ABNORMAL LOW (ref 98–111)
Creatinine, Ser: 2.34 mg/dL — ABNORMAL HIGH (ref 0.61–1.24)
Creatinine, Ser: 2.13 mg/dL — ABNORMAL HIGH (ref 0.61–1.24)
GFR calc Af Amer: 36 mL/min — ABNORMAL LOW (ref >60)
GFR calc Af Amer: 40 mL/min — ABNORMAL LOW (ref >60)
GFR calc non Af Amer: 35 mL/min — ABNORMAL LOW (ref >60)
GFR, EST NON AFRICAN AMERICAN: 31 mL/min — AB (ref >60)
Glucose, Bld: 188 mg/dL — ABNORMAL HIGH (ref 70–99)
Glucose, Bld: 215 mg/dL — ABNORMAL HIGH (ref 70–99)
POTASSIUM: 3.4 mmol/L — AB (ref 3.5–5.1)
Potassium: 3 mmol/L — ABNORMAL LOW (ref 3.5–5.1)
Sodium: 126 mmol/L — ABNORMAL LOW (ref 135–145)
Sodium: 128 mmol/L — ABNORMAL LOW (ref 135–145)
TOTAL PROTEIN: 4.9 g/dL — AB (ref 6.5–8.1)
Total Bilirubin: 1 mg/dL (ref 0.3–1.2)
Total Protein: 4.7 g/dL — ABNORMAL LOW (ref 6.5–8.1)

## 2018-08-17 LAB — CBC WITH DIFFERENTIAL/PLATELET
Abs Immature Granulocytes: 0.1 K/uL — ABNORMAL HIGH (ref 0.00–0.07)
Abs Immature Granulocytes: 0.32 10*3/uL — ABNORMAL HIGH (ref 0.00–0.07)
Basophils Absolute: 0.1 K/uL (ref 0.0–0.1)
Basophils Absolute: 0 10*3/uL (ref 0.0–0.1)
Basophils Relative: 1 %
Basophils Relative: 0 %
Eosinophils Absolute: 0.1 K/uL (ref 0.0–0.5)
Eosinophils Absolute: 0 10*3/uL (ref 0.0–0.5)
Eosinophils Relative: 1 %
Eosinophils Relative: 0 %
HCT: 35.1 % — ABNORMAL LOW (ref 39.0–52.0)
HCT: 36.7 % — ABNORMAL LOW (ref 39.0–52.0)
Hemoglobin: 11.9 g/dL — ABNORMAL LOW (ref 13.0–17.0)
Hemoglobin: 12 g/dL — ABNORMAL LOW (ref 13.0–17.0)
Immature Granulocytes: 1 %
Immature Granulocytes: 2 %
LYMPHS ABS: 1 10*3/uL (ref 0.7–4.0)
LYMPHS PCT: 6 %
Lymphocytes Relative: 5 %
Lymphs Abs: 0.6 K/uL — ABNORMAL LOW (ref 0.7–4.0)
MCH: 25.5 pg — ABNORMAL LOW (ref 26.0–34.0)
MCH: 25.5 pg — ABNORMAL LOW (ref 26.0–34.0)
MCHC: 33.9 g/dL (ref 30.0–36.0)
MCHC: 32.7 g/dL (ref 30.0–36.0)
MCV: 75.3 fL — ABNORMAL LOW (ref 80.0–100.0)
MCV: 77.9 fL — AB (ref 80.0–100.0)
Monocytes Absolute: 0.8 K/uL (ref 0.1–1.0)
Monocytes Absolute: 2.1 10*3/uL — ABNORMAL HIGH (ref 0.1–1.0)
Monocytes Relative: 8 %
Monocytes Relative: 13 %
Neutro Abs: 9.1 K/uL — ABNORMAL HIGH (ref 1.7–7.7)
Neutro Abs: 13 10*3/uL — ABNORMAL HIGH (ref 1.7–7.7)
Neutrophils Relative %: 84 %
Neutrophils Relative %: 79 %
Platelets: 130 K/uL — ABNORMAL LOW (ref 150–400)
Platelets: 175 10*3/uL (ref 150–400)
RBC: 4.66 MIL/uL (ref 4.22–5.81)
RBC: 4.71 MIL/uL (ref 4.22–5.81)
RDW: 13.4 % (ref 11.5–15.5)
RDW: 13.4 % (ref 11.5–15.5)
WBC: 10.7 K/uL — ABNORMAL HIGH (ref 4.0–10.5)
WBC: 16.5 10*3/uL — ABNORMAL HIGH (ref 4.0–10.5)
nRBC: 0 % (ref 0.0–0.2)
nRBC: 0 % (ref 0.0–0.2)

## 2018-08-17 LAB — BASIC METABOLIC PANEL WITH GFR
Anion gap: 10 (ref 5–15)
BUN: 63 mg/dL — ABNORMAL HIGH (ref 6–20)
CO2: 22 mmol/L (ref 22–32)
Calcium: 7.3 mg/dL — ABNORMAL LOW (ref 8.9–10.3)
Chloride: 90 mmol/L — ABNORMAL LOW (ref 98–111)
Creatinine, Ser: 1.97 mg/dL — ABNORMAL HIGH (ref 0.61–1.24)
GFR calc Af Amer: 44 mL/min — ABNORMAL LOW
GFR calc non Af Amer: 38 mL/min — ABNORMAL LOW
Glucose, Bld: 310 mg/dL — ABNORMAL HIGH (ref 70–99)
Potassium: 3.2 mmol/L — ABNORMAL LOW (ref 3.5–5.1)
Sodium: 122 mmol/L — ABNORMAL LOW (ref 135–145)

## 2018-08-17 LAB — CBG MONITORING, ED
Glucose-Capillary: 130 mg/dL — ABNORMAL HIGH (ref 70–99)
Glucose-Capillary: 130 mg/dL — ABNORMAL HIGH (ref 70–99)
Glucose-Capillary: 132 mg/dL — ABNORMAL HIGH (ref 70–99)
Glucose-Capillary: 133 mg/dL — ABNORMAL HIGH (ref 70–99)
Glucose-Capillary: 177 mg/dL — ABNORMAL HIGH (ref 70–99)
Glucose-Capillary: 196 mg/dL — ABNORMAL HIGH (ref 70–99)
Glucose-Capillary: 209 mg/dL — ABNORMAL HIGH (ref 70–99)
Glucose-Capillary: 227 mg/dL — ABNORMAL HIGH (ref 70–99)
Glucose-Capillary: 228 mg/dL — ABNORMAL HIGH (ref 70–99)
Glucose-Capillary: 274 mg/dL — ABNORMAL HIGH (ref 70–99)

## 2018-08-17 LAB — PROTIME-INR
INR: 1.01
Prothrombin Time: 13.2 seconds (ref 11.4–15.2)

## 2018-08-17 LAB — GLUCOSE, CAPILLARY
GLUCOSE-CAPILLARY: 212 mg/dL — AB (ref 70–99)
Glucose-Capillary: 213 mg/dL — ABNORMAL HIGH (ref 70–99)
Glucose-Capillary: 203 mg/dL — ABNORMAL HIGH (ref 70–99)

## 2018-08-17 LAB — PROCALCITONIN: Procalcitonin: 28.84 ng/mL

## 2018-08-17 LAB — APTT: aPTT: 32 seconds (ref 24–36)

## 2018-08-17 LAB — LACTIC ACID, PLASMA: Lactic Acid, Venous: 1.6 mmol/L (ref 0.5–1.9)

## 2018-08-17 MED ORDER — INSULIN ASPART 100 UNIT/ML ~~LOC~~ SOLN
0.0000 [IU] | Freq: Every day | SUBCUTANEOUS | Status: DC
  Administered 2018-08-17: 2 [IU] via SUBCUTANEOUS
  Administered 2018-08-30: 3 [IU] via SUBCUTANEOUS

## 2018-08-17 MED ORDER — LACTATED RINGERS IV BOLUS (SEPSIS)
1000.0000 mL | Freq: Once | INTRAVENOUS | Status: AC
  Administered 2018-08-17: 1000 mL via INTRAVENOUS

## 2018-08-17 MED ORDER — CALCIUM GLUCONATE-NACL 1-0.675 GM/50ML-% IV SOLN
1.0000 g | Freq: Once | INTRAVENOUS | Status: AC
  Administered 2018-08-17: 1000 mg via INTRAVENOUS
  Filled 2018-08-17: qty 50

## 2018-08-17 MED ORDER — HEPARIN SODIUM (PORCINE) 5000 UNIT/ML IJ SOLN
INTRAMUSCULAR | Status: AC
  Filled 2018-08-17: qty 1

## 2018-08-17 MED ORDER — POTASSIUM CHLORIDE 10 MEQ/100ML IV SOLN
10.0000 meq | INTRAVENOUS | Status: AC
  Administered 2018-08-17 (×3): 10 meq via INTRAVENOUS
  Filled 2018-08-17 (×3): qty 100

## 2018-08-17 MED ORDER — VANCOMYCIN HCL IN DEXTROSE 1-5 GM/200ML-% IV SOLN: 1000.0000 mg | Freq: Once | INTRAVENOUS | Status: DC

## 2018-08-17 MED ORDER — VANCOMYCIN HCL IN DEXTROSE 1-5 GM/200ML-% IV SOLN: 1000.0000 mg | INTRAVENOUS | Status: DC

## 2018-08-17 MED ORDER — SODIUM CHLORIDE 0.9 % IV SOLN: 2.0000 g | INTRAVENOUS | Status: DC

## 2018-08-17 MED ORDER — INSULIN ASPART 100 UNIT/ML ~~LOC~~ SOLN
0.0000 [IU] | Freq: Three times a day (TID) | SUBCUTANEOUS | Status: DC
  Administered 2018-08-17 (×2): 5 [IU] via SUBCUTANEOUS
  Administered 2018-08-18: 3 [IU] via SUBCUTANEOUS
  Administered 2018-08-18 (×2): 5 [IU] via SUBCUTANEOUS
  Administered 2018-08-19 (×2): 3 [IU] via SUBCUTANEOUS
  Administered 2018-08-19 – 2018-08-20 (×2): 5 [IU] via SUBCUTANEOUS
  Administered 2018-08-20: 8 [IU] via SUBCUTANEOUS
  Administered 2018-08-21 (×3): 5 [IU] via SUBCUTANEOUS
  Administered 2018-08-22: 3 [IU] via SUBCUTANEOUS
  Administered 2018-08-22: 5 [IU] via SUBCUTANEOUS
  Administered 2018-08-22: 8 [IU] via SUBCUTANEOUS
  Administered 2018-08-23: 5 [IU] via SUBCUTANEOUS
  Administered 2018-08-23: 8 [IU] via SUBCUTANEOUS
  Administered 2018-08-23: 2 [IU] via SUBCUTANEOUS
  Administered 2018-08-24: 3 [IU] via SUBCUTANEOUS
  Administered 2018-08-24: 5 [IU] via SUBCUTANEOUS
  Administered 2018-08-25: 2 [IU] via SUBCUTANEOUS
  Administered 2018-08-25: 3 [IU] via SUBCUTANEOUS
  Administered 2018-08-26: 5 [IU] via SUBCUTANEOUS
  Administered 2018-08-26: 8 [IU] via SUBCUTANEOUS
  Administered 2018-08-26 – 2018-08-28 (×2): 5 [IU] via SUBCUTANEOUS
  Administered 2018-08-28: 8 [IU] via SUBCUTANEOUS
  Administered 2018-08-29: 2 [IU] via SUBCUTANEOUS
  Administered 2018-08-29: 3 [IU] via SUBCUTANEOUS
  Administered 2018-08-29 – 2018-08-30 (×2): 2 [IU] via SUBCUTANEOUS
  Administered 2018-08-30 (×2): 5 [IU] via SUBCUTANEOUS
  Administered 2018-08-31: 3 [IU] via SUBCUTANEOUS
  Administered 2018-08-31: 5 [IU] via SUBCUTANEOUS
  Administered 2018-08-31: 3 [IU] via SUBCUTANEOUS
  Administered 2018-09-01: 2 [IU] via SUBCUTANEOUS
  Administered 2018-09-01: 5 [IU] via SUBCUTANEOUS
  Administered 2018-09-02 (×2): 3 [IU] via SUBCUTANEOUS
  Administered 2018-09-03: 2 [IU] via SUBCUTANEOUS

## 2018-08-17 MED ORDER — LACTATED RINGERS IV BOLUS (SEPSIS)
500.0000 mL | Freq: Once | INTRAVENOUS | Status: AC
  Administered 2018-08-18: 500 mL via INTRAVENOUS

## 2018-08-17 MED ORDER — KETOROLAC TROMETHAMINE 30 MG/ML IJ SOLN: 30.0000 mg | Freq: Once | INTRAMUSCULAR | Status: DC

## 2018-08-17 MED ORDER — INFLUENZA VAC SPLIT QUAD 0.5 ML IM SUSY
0.5000 mL | PREFILLED_SYRINGE | INTRAMUSCULAR | Status: AC
  Administered 2018-08-21: 0.5 mL via INTRAMUSCULAR
  Filled 2018-08-17: qty 0.5

## 2018-08-17 MED ORDER — INSULIN DETEMIR 100 UNIT/ML ~~LOC~~ SOLN
15.0000 [IU] | Freq: Every day | SUBCUTANEOUS | Status: DC
  Administered 2018-08-17: 15 [IU] via SUBCUTANEOUS
  Filled 2018-08-17 (×2): qty 0.15

## 2018-08-17 MED ORDER — POTASSIUM CHLORIDE IN NACL 20-0.9 MEQ/L-% IV SOLN
INTRAVENOUS | Status: DC
  Administered 2018-08-17 – 2018-08-26 (×11): via INTRAVENOUS
  Filled 2018-08-17 (×6): qty 1000

## 2018-08-17 MED ORDER — VANCOMYCIN HCL 10 G IV SOLR
1500.0000 mg | Freq: Once | INTRAVENOUS | Status: AC
  Administered 2018-08-17: 1500 mg via INTRAVENOUS
  Filled 2018-08-17 (×2): qty 1500

## 2018-08-17 MED ORDER — LIVING WELL WITH DIABETES BOOK
Freq: Once | Status: AC
  Administered 2018-08-17: 13:00:00
  Filled 2018-08-17: qty 1

## 2018-08-17 MED ORDER — SODIUM CHLORIDE 0.9 % IV SOLN
INTRAVENOUS | Status: DC | PRN
  Administered 2018-08-17: 250 mL via INTRAVENOUS

## 2018-08-17 MED ORDER — METRONIDAZOLE IN NACL 5-0.79 MG/ML-% IV SOLN
500.0000 mg | Freq: Three times a day (TID) | INTRAVENOUS | Status: DC
  Administered 2018-08-17 – 2018-08-18 (×2): 500 mg via INTRAVENOUS
  Filled 2018-08-17 (×2): qty 100

## 2018-08-17 MED ORDER — AMLODIPINE BESYLATE 10 MG PO TABS
10.0000 mg | ORAL_TABLET | Freq: Every day | ORAL | Status: DC
  Administered 2018-08-17 – 2018-08-27 (×11): 10 mg via ORAL
  Filled 2018-08-17: qty 2
  Filled 2018-08-17: qty 1
  Filled 2018-08-17 (×2): qty 2
  Filled 2018-08-17: qty 1
  Filled 2018-08-17 (×2): qty 2
  Filled 2018-08-17: qty 1
  Filled 2018-08-17 (×3): qty 2

## 2018-08-17 MED ORDER — ATORVASTATIN CALCIUM 10 MG PO TABS
10.0000 mg | ORAL_TABLET | Freq: Every day | ORAL | Status: DC
  Administered 2018-08-17 – 2018-09-05 (×18): 10 mg via ORAL
  Filled 2018-08-17 (×18): qty 1

## 2018-08-17 MED ORDER — INSULIN DETEMIR 100 UNIT/ML ~~LOC~~ SOLN
25.0000 [IU] | Freq: Every day | SUBCUTANEOUS | Status: DC
  Administered 2018-08-18 – 2018-08-20 (×3): 25 [IU] via SUBCUTANEOUS
  Filled 2018-08-17 (×6): qty 0.25

## 2018-08-17 MED ORDER — ACETAMINOPHEN 325 MG PO TABS
ORAL_TABLET | ORAL | Status: AC
  Filled 2018-08-17: qty 2

## 2018-08-17 MED ORDER — INSULIN ASPART 100 UNIT/ML ~~LOC~~ SOLN
6.0000 [IU] | Freq: Three times a day (TID) | SUBCUTANEOUS | Status: DC
  Administered 2018-08-17 – 2018-08-20 (×9): 6 [IU] via SUBCUTANEOUS

## 2018-08-17 MED ORDER — SODIUM CHLORIDE 0.9 % IV SOLN
1.0000 g | Freq: Two times a day (BID) | INTRAVENOUS | Status: DC
  Administered 2018-08-17 – 2018-08-18 (×2): 1 g via INTRAVENOUS
  Filled 2018-08-17 (×5): qty 1

## 2018-08-17 NOTE — Progress Notes (Addendum)
Pharmacy Antibiotic Note  Randy Oneal is a 53 y.o. male admitted on 08/16/2018 with cellulitis.  Pharmacy has been consulted for cefepime and vancomycin dosing.  Plan:vancomycin 1.5gm iv x 1  Vancomycin 1000mg  IV every 24 hours.  Goal trough 10-15 mcg/mL. cefepime 1gm q12h  Height: 5\' 10"  (177.8 cm) Weight: 182 lb (82.6 kg) IBW/kg (Calculated) : 73  Temp (24hrs), Avg:100.6 F (38.1 C), Min:97.6 F (36.4 C), Max:103.1 F (39.5 C)  Recent Labs  Lab 08/16/18 1135 08/16/18 2328 08/17/18 0427  WBC 13.8*  --  10.7*  CREATININE 2.27* 1.97* 2.34*    Estimated Creatinine Clearance: 38.1 mL/min (A) (by C-G formula based on SCr of 2.34 mg/dL (H)).    No Known Allergies   Antimicrobials this admission: 1/7 cefepime >>  1/7 vancomycin >> 1/7 bactrim x 1 1/7 ceftriaxone x 1 1/7 metronidazole >>   Microbiology results: 1/7 BCx: sent   Thank you for allowing pharmacy to be a part of this patient's care.  Donna Christen Glenden Rossell 08/17/2018 9:34 PM

## 2018-08-17 NOTE — Progress Notes (Signed)
Patient ID: [name] III, male   DOB: [birth year], 53 y.o.   MRN: [id number]   Night shift floor coverage note.  The patient was seen due to fever and tachycardia.  His most recent vital signs were 103.1 F, pulse 108, respirations 16, blood pressure 147/79 mmHg and O2 sat 95% on room air.His right big toe callus now looks black, erythematous, is TTP and is draining a foul-smelling discharge. He has a plantar pustule on his left foot.  Please see pictures below.  Sepsis and diabetic foot ulcer protocol started.          White count was 16.5 with 79% neutrophils, 6% lymphocytes and 13% monocytes.  Hemoglobin was 12.0 g/dL and platelets 175.  Lactic acid x2 was normal. Sodium is 129, potassium 3.4, chloride 97 and CO2 23 mmol/L.  BUN was 56, creatinine 2.13 and glucose 250 mg/dL.  Total protein 4.9, albumin 1.6 g/dL.  AST 89, ALT 45 and alkaline phosphatase 171 units/L.  Total bilirubin was normal at 1.2 mg/dL.  His right foot x-ray does not show signs of osteomyelitis.  Assessment: Diabetic foot ulcer AKI Abnormal LFTs Hypokalemia  Plan: Antibiotics have been started. Podiatry has been consulted. We will continue IV fluids. Follow-up LFTs in a.m. Replace potassium.  Tennis Must, MD

## 2018-08-17 NOTE — Progress Notes (Signed)
Dr. Olevia Bowens called to check on patient and RN informed him that patient has wound that RN found under callous on posterior right great toe.  Wound measures 1cmx4cmx0.5cm and has odor.  Patient reports he had painful callous and cut area about 2 weeks ago.  Wound has scant serosanguinous drainage noted.  RN cleaned wound with saline and applied dry gauze dressing. Dr. Olevia Bowens instructed RN to take another set of VS, VSS except temp 103.1.  RN paged Dr. Olevia Bowens and made him aware, awaiting response.  P.J. Linus Mako, RN

## 2018-08-17 NOTE — ED Notes (Signed)
Per Nightshift nurse, start Potassium in NaCl after regular Potassium is finished.

## 2018-08-17 NOTE — ED Notes (Signed)
Pt had episode of incontinence  of urine, unable to state which year it is correctly, Dr Olevia Bowens at bedside, additional orders given,

## 2018-08-17 NOTE — ED Notes (Signed)
MD at bedside. 

## 2018-08-17 NOTE — Progress Notes (Addendum)
Inpatient Diabetes Program Recommendations  AACE/ADA: New Consensus Statement on Inpatient Glycemic Control   Target Ranges:  Prepandial:   less than 140 mg/dL      Peak postprandial:   less than 180 mg/dL (1-2 hours)      Critically ill patients:  140 - 180 mg/dL   Results for Randy Oneal, Randy Oneal (MRN [id number]) as of 08/17/2018 08:10  Ref. Range 08/17/2018 01:21 08/17/2018 02:21 08/17/2018 03:19 08/17/2018 04:32 08/17/2018 05:37 08/17/2018 06:35 08/17/2018 07:36  Glucose-Capillary Latest Ref Range: 70 - 99 mg/dL 228 (H) 209 (H) 227 (H) 177 (H) 132 (H) 130 (H) 130 (H)   Results for Randy Oneal, Randy Oneal (MRN [id number]) as of 08/17/2018 08:10  Ref. Range 08/16/2018 11:35  Glucose Latest Ref Range: 70 - 99 mg/dL 432 (H)   Results for Randy Oneal, Randy Oneal (MRN [id number]) as of 08/17/2018 08:10  Ref. Range 07/09/2017 12:32 02/04/2018 13:55 08/16/2018 09:33  Hemoglobin A1C Latest Ref Range: 4.0 - 5.6 % >15.5 (H) >15.5 (H) 13.0 (A)   Review of Glycemic Control  Diabetes history: DM2 Outpatient Diabetes medications: Lantus 30 units QHS, Humalog 8 units TID with meals Current orders for Inpatient glycemic control: IV insulin drip  Inpatient Diabetes Program Recommendations:   Insulin SQ at time of transition off IV insulin: At time of transition from IV to SQ insulin, please consider ordering Lantus 25 units Q24H, CBGs with Novolog 0-9 units TID with meals and Novolog 0-5 units QHS, and Novolog 3 units TID with meals for meal coverage if patient eats at least 50% of meals.  HgbA1C: A1C 13% on 08/16/18 indicating an average glucose of 326 mg/dl over the past 2-3 months. Patient is interested in obtaining a prescription for the FreeStyle Libre 14 day reader and 14 day sensor. If, MD is willing to prescribe, please provide Rx for [facility] reader (order # [id number]) and sensor (order # [id number]) at time of discharge. If not, please encourage patient to ask his PCP about prescribing it for him.  Addendum 08/17/18-Spoke with patient  over the phone about diabetes and home regimen for diabetes control. Patient reports that he is followed by PCP for diabetes management. Noted patient has seen Dr. Dorris Fetch in the past (last seen 08/10/2017). Inquired about an Endocrinologist and patient confirms that he use to see Dr. Dorris Fetch but he prefers to see a different Endocrinologist. Patient states that PCP is working on referring him to a Administrator, arts. Patient reports that he is taking Lantus 30 units QHS and Humalog 8 units TID with meals as an outpatient for diabetes control. Patient reports that he is taking insulin consistently as prescribed.  Patient states that he checks his glucose 2-3 times per day and that it is usually high. Asked to elaborate on high and patient states in the 300-400's mg/dl.   Discussed A1C results (13.0% on 08/16/18) and explained that his current A1C indicates an average glucose of 326 mg/dl over the past 2-3 months. Discussed glucose and A1C goals. Discussed importance of checking CBGs and maintaining good CBG control to prevent long-term and short-term complications. Explained how hyperglycemia leads to damage within blood vessels which lead to the common complications seen with uncontrolled diabetes. Stressed to the patient the importance of improving glycemic control to prevent further complications from uncontrolled diabetes. Discussed impact of nutrition, exercise, stress, sickness, and medications on diabetes control. Patient states that he knows he needs to make changes with Carb Modified diet. Patient states that he drinks sugary beverages (regular soda and  sweet tea) and eats foods that he shouldn't be eating. Encouraged patient to eliminate sugary beverages and encouraged to drink water, diet soda, or unsweetened tea.  Discussed carbohydrates, carbohydrate goals per day and meal, along with portion sizes. Patient feels he would benefit from inpatient RD consult for further diet education. Placed order for RD  consult. Encouraged patient to read Living Well with DM book (patient confirms he has the book in the room at this time).   Encouraged patient to check his glucose 3-4 times per day (before meals and at bedtime) and to keep a log book of glucose readings and insulin taken which he will need to take to doctor appointments. Patient states he is interested in getting the FreeStyle Libre (flash glucose monitoring sensor) and he requested it be prescribed at discharge. Discussed FreeStyle Libre in more detail. Informed patient it would be requested that it be prescribed; however, hospital MD may prefer he work with is PCP to obtain it.  Asked patient to follow up with PCP about Endocrinologist referral and be sure to keep a detailed record of glucose trends so the doctor can use the information to make adjustments with DM medications is needed.  Patient verbalized understanding of information discussed and he states that he has no further questions at this time related to diabetes.  Thanks, [name], RN, MSN, CDE Diabetes Coordinator Inpatient Diabetes Program [phone] (Team Pager from 8am to 5pm)

## 2018-08-17 NOTE — ED Notes (Signed)
Have paged Dr. Manuella Ghazi for subsequent orders

## 2018-08-17 NOTE — ED Notes (Signed)
Have given pt a meal tray  

## 2018-08-17 NOTE — Progress Notes (Signed)
PROGRESS NOTE    Randy Oneal  ATF:[id number] DOB: 04/19/1966 DOA: 08/16/2018 PCP: [name], MD   Brief Narrative:  Per HPI: Randy Oneal is a 53 y.o. male with medical history significant of type 2 diabetes, hypertension, renal insufficiency who is coming to the emergency department referred by his PCP Dr. Wolfgang Phoenix due to dehydration and uncontrolled diabetes.  He is unable to provide further history as he is only oriented to name.  He follows simple commands, but quickly forgets while he is doing.  I spoke to his wife [name] who stated the patient has been having polyuria, polydipsia for a while.  She also stated that he has some visual hallucinations over the past 2 days.  To her knowledge, he has not had head trauma, fever, chills, dyspnea, cough, chest pain, abdominal pain, vomiting or diarrhea.  No further history is available at this time.  Patient was admitted with diabetic, hyperosmolar nonketotic state with poor compliance on home insulin regimen.  He was also noted to have significant electrolyte abnormalities in the face of acute kidney injury.  Assessment & Plan:   Principal Problem:   AKI (acute kidney injury) ([place]) Active Problems:   Type 2 diabetes mellitus not at goal [facility])   HTN (hypertension), benign   Personal history of noncompliance with medical treatment, presenting hazards to health   Hyperlipidemia associated with type 2 diabetes mellitus ([place])   Hyponatremia   Diabetic hyperosmolar non-ketotic state ([place])   Hyperglycemia   1. Diabetic, hyperosmolar nonketotic state secondary to uncontrolled type 2 diabetes with poor medication compliance.  Patient's recently states that he had an episode of prostatitis and therefore felt that he did not need to take insulin at home.  His recent hemoglobin A1c is 13% indicating poor control.  He is ready to come off insulin drip as well as D5 infusion as he is tolerating diet and will transition with Levemir.  Appreciate  diabetes coordinator recommendations and will increase Levemir dose and provide meal coverage.  Monitor blood glucose levels. 2. 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  Continue IV fluid hydration with potassium.  Monitor strict inputs and outputs and consider renal ultrasound as needed.  Recheck a.m. labs. 3. Hyponatremia.  Likely secondary to element of dehydration as well as hyperglycemia.  This appears to be improving.  Continue IV fluid and monitor on repeat labs. 4. Hypokalemia.  Continue IV infusion with potassium and recheck labs in a.m.  We will slow down the rate of infusion at this time. 5. Hypertension-currently controlled.  Hold home lisinopril and use labetalol as needed for now.  Continue on home amlodipine. 6. Dyslipidemia.  Continue home statin.   DVT prophylaxis: Heparin Code Status: Full Family Communication: None at bedside Disposition Plan: Observation, MedSurg; anticipate discharge in a.m. if electrolytes improved.   Consultants:   None  Procedures:   None  Antimicrobials:   None   Subjective: Patient seen and evaluated today with no new acute complaints or concerns. No acute concerns or events noted overnight.  He is more awake and alert and denies any nausea or vomiting.  He is ready to eat.  Objective: Vitals:   08/17/18 0730 08/17/18 0800 08/17/18 0830 08/17/18 0918  BP: 135/77 134/72 (!) 129/100 133/76  Pulse: 100 100 (!) 104 (!) 110  Resp: 12 11 11 14   Temp:    97.6 F (36.4 C)  TempSrc:    Oral  SpO2: 93% 99% 99% 97%  Weight:  Height:        Intake/Output Summary (Last 24 hours) at 08/17/2018 1031 Last data filed at 08/17/2018 0227 Gross per 24 hour  Intake 2078.77 ml  Output -  Net 2078.77 ml   Filed Weights   08/16/18 1502  Weight: 82.6 kg    Examination:  General exam: Appears calm and comfortable  Respiratory system: Clear to auscultation. Respiratory effort normal. Cardiovascular system: S1 & S2 heard, RRR. No JVD,  murmurs, rubs, gallops or clicks. No pedal edema. Gastrointestinal system: Abdomen is nondistended, soft and nontender. No organomegaly or masses felt. Normal bowel sounds heard. Central nervous system: Alert and oriented. No focal neurological deficits. Extremities: Symmetric 5 x 5 power. Skin: No rashes, lesions or ulcers Psychiatry: Judgement and insight appear normal. Mood & affect appropriate.     Data Reviewed: I have personally reviewed following labs and imaging studies  CBC: Recent Labs  Lab 08/16/18 1135 08/17/18 0427  WBC 13.8* 10.7*  NEUTROABS 9.6* 9.1*  HGB 14.1 11.9*  HCT 43.0 35.1*  MCV 78.3* 75.3*  PLT 144* 161*   Basic Metabolic Panel: Recent Labs  Lab 08/16/18 1135 08/16/18 2328 08/17/18 0427  NA 121* 122* 126*  K 4.1 3.2* 3.0*  CL 82* 90* 94*  CO2 24 22 24   GLUCOSE 432* 310* 188*  BUN 67* 63* 63*  CREATININE 2.27* 1.97* 2.34*  CALCIUM 8.0* 7.3* 7.2*  MG  --  3.1*  --   PHOS  --  3.5  --    GFR: Estimated Creatinine Clearance: 38.1 mL/min (A) (by C-G formula based on SCr of 2.34 mg/dL (H)). Liver Function Tests: Recent Labs  Lab 08/16/18 1135 08/17/18 0427  AST 44* 40  ALT 35 26  ALKPHOS 160* 126  BILITOT 1.9* 1.0  PROT 6.1* 4.7*  ALBUMIN 2.3* 1.7*   No results for input(s): LIPASE, AMYLASE in the last 168 hours. No results for input(s): AMMONIA in the last 168 hours. Coagulation Profile: No results for input(s): INR, PROTIME in the last 168 hours. Cardiac Enzymes: No results for input(s): CKTOTAL, CKMB, CKMBINDEX, TROPONINI in the last 168 hours. BNP (last 3 results) No results for input(s): PROBNP in the last 8760 hours. HbA1C: Recent Labs    08/16/18 0933  HGBA1C 13.0*   CBG: Recent Labs  Lab 08/17/18 0537 08/17/18 0635 08/17/18 0736 08/17/18 0832 08/17/18 1023  GLUCAP 132* 130* 130* 133* 196*   Lipid Profile: No results for input(s): CHOL, HDL, LDLCALC, TRIG, CHOLHDL, LDLDIRECT in the last 72 hours. Thyroid Function  Tests: No results for input(s): TSH, T4TOTAL, FREET4, T3FREE, THYROIDAB in the last 72 hours. Anemia Panel: No results for input(s): VITAMINB12, FOLATE, FERRITIN, TIBC, IRON, RETICCTPCT in the last 72 hours. Sepsis Labs: No results for input(s): PROCALCITON, LATICACIDVEN in the last 168 hours.  No results found for this or any previous visit (from the past 240 hour(s)).       Radiology Studies: Ct Head Wo Contrast  Result Date: 08/17/2018 CLINICAL DATA:  Initial evaluation for acute altered mental status. EXAM: CT HEAD WITHOUT CONTRAST TECHNIQUE: Contiguous axial images were obtained from the base of the skull through the vertex without intravenous contrast. COMPARISON:  None. FINDINGS: Brain: Cerebral volume within normal limits for patient age. No evidence for acute intracranial hemorrhage. No findings to suggest acute large vessel territory infarct. No mass lesion, midline shift, or mass effect. Ventricles are normal in size without evidence for hydrocephalus. No extra-axial fluid collection identified. Vascular: No hyperdense vessel identified. Skull: Scalp  soft tissues demonstrate no acute abnormality. Calvarium intact. Sinuses/Orbits: Globes and orbital soft tissues within normal limits. Visualized paranasal sinuses are clear. No mastoid effusion. IMPRESSION: Negative head CT.  No acute intracranial abnormality identified. Electronically Signed   By: Jeannine Boga M.D.   On: 08/17/2018 02:16        Scheduled Meds: . acetaminophen      . amLODipine  10 mg Oral Daily  . atorvastatin  10 mg Oral Daily  . heparin  5,000 Units Subcutaneous Q8H  . insulin aspart  0-15 Units Subcutaneous TID WC  . insulin aspart  0-5 Units Subcutaneous QHS  . insulin aspart  6 Units Subcutaneous TID WC  . insulin detemir  15 Units Subcutaneous Daily   Continuous Infusions: . 0.9 % NaCl with KCl 20 mEq / L 125 mL/hr at 08/17/18 0943     LOS: 0 days    Time spent: 30 minutes    Ammar Moffatt Darleen Crocker, DO [facility] Pager [phone]  If 7PM-7AM, please contact night-coverage [email or web address] Password [facility] 08/17/2018, 10:31 AM

## 2018-08-18 ENCOUNTER — Inpatient Hospital Stay (HOSPITAL_COMMUNITY): Payer: Managed Care, Other (non HMO)

## 2018-08-18 DIAGNOSIS — I10 Essential (primary) hypertension: Secondary | ICD-10-CM

## 2018-08-18 DIAGNOSIS — A401 Sepsis due to streptococcus, group B: Secondary | ICD-10-CM

## 2018-08-18 DIAGNOSIS — R6520 Severe sepsis without septic shock: Secondary | ICD-10-CM

## 2018-08-18 DIAGNOSIS — L97519 Non-pressure chronic ulcer of other part of right foot with unspecified severity: Secondary | ICD-10-CM

## 2018-08-18 LAB — BLOOD CULTURE ID PANEL (REFLEXED)

## 2018-08-18 LAB — C-REACTIVE PROTEIN: CRP: 30 mg/dL — ABNORMAL HIGH (ref <1.0)

## 2018-08-18 LAB — GLUCOSE, CAPILLARY
GLUCOSE-CAPILLARY: 183 mg/dL — AB (ref 70–99)
Glucose-Capillary: 202 mg/dL — ABNORMAL HIGH (ref 70–99)
Glucose-Capillary: 240 mg/dL — ABNORMAL HIGH (ref 70–99)
Glucose-Capillary: 190 mg/dL — ABNORMAL HIGH (ref 70–99)

## 2018-08-18 LAB — SEDIMENTATION RATE: Sed Rate: 81 mm/hr — ABNORMAL HIGH (ref 0–16)

## 2018-08-18 LAB — CBC WITH DIFFERENTIAL/PLATELET
Abs Immature Granulocytes: 0.4 10*3/uL — ABNORMAL HIGH (ref 0.00–0.07)
Basophils Absolute: 0.1 10*3/uL (ref 0.0–0.1)
Basophils Relative: 0 %
EOS ABS: 0 10*3/uL (ref 0.0–0.5)
EOS PCT: 0 %
HCT: 38.7 % — ABNORMAL LOW (ref 39.0–52.0)
Hemoglobin: 12.5 g/dL — ABNORMAL LOW (ref 13.0–17.0)
Immature Granulocytes: 2 %
Lymphocytes Relative: 6 %
Lymphs Abs: 1.2 10*3/uL (ref 0.7–4.0)
MCH: 25.1 pg — ABNORMAL LOW (ref 26.0–34.0)
MCHC: 32.3 g/dL (ref 30.0–36.0)
MCV: 77.7 fL — ABNORMAL LOW (ref 80.0–100.0)
Monocytes Absolute: 2.2 10*3/uL — ABNORMAL HIGH (ref 0.1–1.0)
Monocytes Relative: 12 %
Neutro Abs: 15.2 10*3/uL — ABNORMAL HIGH (ref 1.7–7.7)
Neutrophils Relative %: 80 %
Platelets: 181 10*3/uL (ref 150–400)
RBC: 4.98 MIL/uL (ref 4.22–5.81)
RDW: 13.9 % (ref 11.5–15.5)
WBC: 19.1 10*3/uL — ABNORMAL HIGH (ref 4.0–10.5)
nRBC: 0 % (ref 0.0–0.2)

## 2018-08-18 LAB — COMPREHENSIVE METABOLIC PANEL
ALT: 41 U/L (ref 0–44)
AST: 71 U/L — ABNORMAL HIGH (ref 15–41)
Albumin: 1.8 g/dL — ABNORMAL LOW (ref 3.5–5.0)
Alkaline Phosphatase: 178 U/L — ABNORMAL HIGH (ref 38–126)
Anion gap: 8 (ref 5–15)
BUN: 45 mg/dL — ABNORMAL HIGH (ref 6–20)
CO2: 22 mmol/L (ref 22–32)
Calcium: 7.4 mg/dL — ABNORMAL LOW (ref 8.9–10.3)
Chloride: 101 mmol/L (ref 98–111)
Creatinine, Ser: 1.71 mg/dL — ABNORMAL HIGH (ref 0.61–1.24)
GFR calc Af Amer: 52 mL/min — ABNORMAL LOW (ref >60)
GFR calc non Af Amer: 45 mL/min — ABNORMAL LOW (ref >60)
Glucose, Bld: 212 mg/dL — ABNORMAL HIGH (ref 70–99)
Potassium: 3.6 mmol/L (ref 3.5–5.1)
Sodium: 131 mmol/L — ABNORMAL LOW (ref 135–145)
Total Bilirubin: 1.4 mg/dL — ABNORMAL HIGH (ref 0.3–1.2)
Total Protein: 5.4 g/dL — ABNORMAL LOW (ref 6.5–8.1)

## 2018-08-18 LAB — LACTIC ACID, PLASMA: LACTIC ACID, VENOUS: 1.6 mmol/L (ref 0.5–1.9)

## 2018-08-18 LAB — HIV ANTIBODY (ROUTINE TESTING W REFLEX): HIV Screen 4th Generation wRfx: NONREACTIVE

## 2018-08-18 LAB — ECHOCARDIOGRAM COMPLETE
Height: 70 in
WEIGHTICAEL: 2912 [oz_av]

## 2018-08-18 LAB — PREALBUMIN: Prealbumin: <5 mg/dL — ABNORMAL LOW (ref 18–38)

## 2018-08-18 MED ORDER — POLYETHYLENE GLYCOL 3350 17 G PO PACK
17.0000 g | PACK | Freq: Every day | ORAL | Status: DC
  Administered 2018-08-18 – 2018-08-25 (×6): 17 g via ORAL
  Filled 2018-08-18 (×14): qty 1

## 2018-08-18 MED ORDER — PRO-STAT SUGAR FREE PO LIQD
30.0000 mL | Freq: Two times a day (BID) | ORAL | Status: DC
  Administered 2018-08-18 – 2018-09-05 (×30): 30 mL via ORAL
  Filled 2018-08-18 (×33): qty 30

## 2018-08-18 MED ORDER — PENICILLIN G POTASSIUM 20000000 UNITS IJ SOLR
4.0000 10*6.[IU] | INTRAVENOUS | Status: DC
  Administered 2018-08-18 – 2018-08-23 (×29): 4 10*6.[IU] via INTRAVENOUS
  Filled 2018-08-18 (×36): qty 4

## 2018-08-18 MED ORDER — DOCUSATE SODIUM 100 MG PO CAPS
100.0000 mg | ORAL_CAPSULE | Freq: Two times a day (BID) | ORAL | Status: DC
  Administered 2018-08-18 – 2018-09-03 (×23): 100 mg via ORAL
  Filled 2018-08-18 (×32): qty 1

## 2018-08-18 MED ORDER — GADOBUTROL 1 MMOL/ML IV SOLN
4.0000 mL | Freq: Once | INTRAVENOUS | Status: AC | PRN
  Administered 2018-08-18: 4 mL via INTRAVENOUS

## 2018-08-18 NOTE — Consult Note (Addendum)
[place] Nurse wound consult note Consult requested for right foot wound.  Reviewed progress notes and photos in the EMR.  Pt has a full thickness wound which is located to the plantar surface of the right great toe; it is covered by a dry dark brown callous.  Bedside nurse notes it is 1X4X.5cm with mod amt tan foul smelling drainage and is fluctuant. Hard dry raised callous surrounding the wound and generalized edema to the right great toe.  X-ray does not indicate osteomylitis, but patient may need an MRI for more definitive results. He could benefit from an ABI to assess perfusion, and possible sharp debridement of nonviable tissue.  Progress notes indicate a podiatry consult is pending.  Xeroform gauze is ordered for the bedside nurse to apply as topical treatment, until further plan of care is provided by their service.  Please re-consult if further assistance is needed.  Thank-you,  Julien Girt MSN, [place], [place], [place], [place]

## 2018-08-18 NOTE — Progress Notes (Signed)
*  PRELIMINARY RESULTS* Echocardiogram 2D Echocardiogram has been performed.  Randy Oneal 08/18/2018, 3:43 PM

## 2018-08-18 NOTE — Progress Notes (Signed)
PROGRESS NOTE    Randy Oneal  HGD:[id number] DOB: [birth year] DOA: 08/16/2018 PCP: [name], MD   Brief Narrative:  Per HPI: Randy Oneal is a 53 y.o. male with medical history significant of type 2 diabetes, hypertension, renal insufficiency who is coming to the emergency department referred by his PCP Dr. Wolfgang Phoenix due to dehydration and uncontrolled diabetes.  He is unable to provide further history as he is only oriented to name.  He follows simple commands, but quickly forgets while he is doing.  I spoke to his wife [name] who stated the patient has been having polyuria, polydipsia for a while.  She also stated that he has some visual hallucinations over the past 2 days.  To her knowledge, he has not had head trauma, fever, chills, dyspnea, cough, chest pain, abdominal pain, vomiting or diarrhea.  No further history is available at this time.  Patient was admitted with diabetic, hyperosmolar nonketotic state with poor compliance on home insulin regimen.  He was also noted to have significant electrolyte abnormalities in the face of acute kidney injury.  Assessment & Plan:   Principal Problem:   AKI (acute kidney injury) ([place]) Active Problems:   Type 2 diabetes mellitus not at goal [facility])   HTN (hypertension), benign   Personal history of noncompliance with medical treatment, presenting hazards to health   Hyperlipidemia associated with type 2 diabetes mellitus ([place])   Hyponatremia   Diabetic hyperosmolar non-ketotic state ([place])   Hyperglycemia   1. Diabetic, hyperosmolar nonketotic state secondary to uncontrolled type 2 diabetes with poor medication compliance.  Patient's reported to be noncompliant with his medications at home.  A1c 13%.  Has been now been transition off insulin drip and is receiving a sliding scale insulin and Levemir.  We will continue to follow CBGs and adjust as needed for poor control.  Continue modified carbohydrate diet. 2. Sepsis secondary to right  foot diabetic ulcer and a strep bacteremia: Continue as needed antipyretics, continue IV fluids, follow culture sensitivity.  Started on IV penicillin G 4 times a day. General surgery has been consulted for assessment of the need of surgical debridement. 3. 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  Renal function has improved with fluid resuscitation.  We will continue minimizing the use of nephrotoxic agents.  Follow basic metabolic panel in a.m.   4. Hyponatremia.  Likely secondary to element of dehydration as well as hyperglycemia.  This appears to be back to baseline.  Instructed to keep himself well-hydrated. 5. Hypokalemia.  Improving within normal limits now. 6. Hypertension-currently controlled.  Continue as needed labetalol and amlodipine. 7. Dyslipidemia.  Continue home statin.   DVT prophylaxis: Heparin Code Status: Full Family Communication: None at bedside Disposition Plan: Remains inpatient.  General surgery will be consulted; started on IV antibiotics; continue treatment for his uncontrolled diabetes.   Consultants:   General surgery  Procedures:   See below for x-ray reports.  Antimicrobials:   None   Subjective: Continued to spike fevers.  Denies chest pain, no nausea, no vomiting.  Objective: Vitals:   08/18/18 0553 08/18/18 1356 08/18/18 1540 08/18/18 1825  BP: (!) 176/96 (!) 154/89    Pulse: (!) 106 (!) 112    Resp:  18    Temp: 99.6 F (37.6 C) (!) 100.7 F (38.2 C) (!) 100.4 F (38 C) 98.8 F (37.1 C)  TempSrc: Oral Oral Oral Oral  SpO2: 95% 97%    Weight:      Height:  Intake/Output Summary (Last 24 hours) at 08/18/2018 1924 Last data filed at 08/18/2018 1737 Gross per 24 hour  Intake 2928.02 ml  Output 1550 ml  Net 1378.02 ml   Filed Weights   08/16/18 1502  Weight: 82.6 kg    Examination: General exam: Alert, awake, oriented x 3; no chest pain, no shortness of breath.  Patient reports chills.  Patient continued to spike  fevers. Respiratory system: Clear to auscultation. Respiratory effort normal. Cardiovascular system:RRR. No murmurs, rubs, gallops. Gastrointestinal system: Abdomen is nondistended, soft and nontender. No organomegaly or masses felt. Normal bowel sounds heard. Central nervous system: Alert and oriented. No focal neurological deficits. Extremities: No cyanosis or clubbing.  Right great toe with positive open wound, foul smell drainage and mild tenderness to palpation.  There is positive fluctuation on the lateral aspect. Skin: No petechiae, no other wound abnormalities except as mentioned on extremities. Psychiatry: Judgement and insight appear normal. Mood & affect appropriate.   Data Reviewed: I have personally reviewed following labs and imaging studies  CBC: Recent Labs  Lab 08/16/18 1135 08/17/18 0427 08/17/18 2149 08/18/18 0548  WBC 13.8* 10.7* 16.5* 19.1*  NEUTROABS 9.6* 9.1* 13.0* 15.2*  HGB 14.1 11.9* 12.0* 12.5*  HCT 43.0 35.1* 36.7* 38.7*  MCV 78.3* 75.3* 77.9* 77.7*  PLT 144* 130* 175 539   Basic Metabolic Panel: Recent Labs  Lab 08/16/18 1135 08/16/18 2328 08/17/18 0427 08/17/18 2149 08/18/18 0548  NA 121* 122* 126* 128* 131*  K 4.1 3.2* 3.0* 3.4* 3.6  CL 82* 90* 94* 97* 101  CO2 24 22 24 23 22   GLUCOSE 432* 310* 188* 215* 212*  BUN 67* 63* 63* 56* 45*  CREATININE 2.27* 1.97* 2.34* 2.13* 1.71*  CALCIUM 8.0* 7.3* 7.2* 7.3* 7.4*  MG  --  3.1*  --   --   --   PHOS  --  3.5  --   --   --    GFR: Estimated Creatinine Clearance: 52.2 mL/min (A) (by C-G formula based on SCr of 1.71 mg/dL (H)).   Liver Function Tests: Recent Labs  Lab 08/16/18 1135 08/17/18 0427 08/17/18 2149 08/18/18 0548  AST 44* 40 89* 71*  ALT 35 26 45* 41  ALKPHOS 160* 126 171* 178*  BILITOT 1.9* 1.0 1.2 1.4*  PROT 6.1* 4.7* 4.9* 5.4*  ALBUMIN 2.3* 1.7* 1.6* 1.8*   Coagulation Profile: Recent Labs  Lab 08/17/18 2149  INR 1.01   HbA1C: Recent Labs    08/16/18 0933  HGBA1C  13.0*   CBG: Recent Labs  Lab 08/17/18 1705 08/17/18 2101 08/18/18 0713 08/18/18 1134 08/18/18 1606  GLUCAP 213* 203* 202* 240* 190*   Sepsis Labs: Recent Labs  Lab 08/17/18 2149 08/18/18 0006  PROCALCITON 28.84  --   LATICACIDVEN 1.6 1.6    Recent Results (from the past 240 hour(s))  Culture, blood (routine x 2)     Status: None (Preliminary result)   Collection Time: 08/17/18 10:55 AM  Result Value Ref Range Status   Specimen Description   Final    BLOOD RIGHT ANTECUBITAL Performed at [facility], [address] [place], [place] [address]    Special Requests   Final    BOTTLES DRAWN AEROBIC AND ANAEROBIC Blood Culture adequate volume Performed at [facility], [address] [place], [place] [address]    Culture  Setup Time   Final    IN BOTH AEROBIC AND ANAEROBIC BOTTLES GRAM POSITIVE COCCI IN CHAINS Gram Stain Report Called to,Read Back By  and Verified With: PJ SEXTON,RN @0239  08/18/18 MKELLY Organism ID to follow Performed at [facility], [place] [address], [place], [place] [address]    Culture   Final    NO GROWTH < 24 HOURS Performed at [facility], [address], [place], [place] [address]    Report Status PENDING  Incomplete  Blood Culture ID Panel (Reflexed)     Status: Abnormal   Collection Time: 08/17/18 10:55 AM  Result Value Ref Range Status   Enterococcus species NOT DETECTED NOT DETECTED Final   Listeria monocytogenes NOT DETECTED NOT DETECTED Final   Staphylococcus species NOT DETECTED NOT DETECTED Final   Staphylococcus aureus (BCID) NOT DETECTED NOT DETECTED Final   Streptococcus species DETECTED (A) NOT DETECTED Final    Comment: CRITICAL RESULT CALLED TO, READ BACK BY AND VERIFIED WITH: Forest Gleason PharmD 9:45 08/18/18 ([email or web address])    Streptococcus agalactiae DETECTED (A) NOT DETECTED Final    Comment: CRITICAL RESULT CALLED TO, READ BACK BY AND VERIFIED WITH: Forest Gleason PharmD 9:45 08/18/18 ([email or web address])    Streptococcus pneumoniae NOT DETECTED  NOT DETECTED Final   Streptococcus pyogenes NOT DETECTED NOT DETECTED Final   Acinetobacter baumannii NOT DETECTED NOT DETECTED Final   Enterobacteriaceae species NOT DETECTED NOT DETECTED Final   Enterobacter cloacae complex NOT DETECTED NOT DETECTED Final   Escherichia coli NOT DETECTED NOT DETECTED Final   Klebsiella oxytoca NOT DETECTED NOT DETECTED Final   Klebsiella pneumoniae NOT DETECTED NOT DETECTED Final   Proteus species NOT DETECTED NOT DETECTED Final   Serratia marcescens NOT DETECTED NOT DETECTED Final   Haemophilus influenzae NOT DETECTED NOT DETECTED Final   Neisseria meningitidis NOT DETECTED NOT DETECTED Final   Pseudomonas aeruginosa NOT DETECTED NOT DETECTED Final   Candida albicans NOT DETECTED NOT DETECTED Final   Candida glabrata NOT DETECTED NOT DETECTED Final   Candida krusei NOT DETECTED NOT DETECTED Final   Candida parapsilosis NOT DETECTED NOT DETECTED Final   Candida tropicalis NOT DETECTED NOT DETECTED Final    Comment: Performed at [facility], [place], [place], [place] [address]  Culture, blood (routine x 2)     Status: None (Preliminary result)   Collection Time: 08/17/18 11:04 AM  Result Value Ref Range Status   Specimen Description BLOOD LEFT HAND  Final   Special Requests   Final    BOTTLES DRAWN AEROBIC ONLY Blood Culture results may not be optimal due to an inadequate volume of blood received in culture bottles   Culture  Setup Time   Final    AEROBIC BOTTLE ONLY GRAM POSITIVE COCCI IN CHAINS Gram Stain Report Called to,Read Back By and Verified With: PJ SEXTON,RN @0235  08/18/18 MKELLY    Culture   Final    NO GROWTH < 24 HOURS Performed at [facility], [address] [place], [place] [address]    Report Status PENDING  Incomplete     Radiology Studies: Ct Head Wo Contrast  Result Date: 08/17/2018 CLINICAL DATA:  Initial evaluation for acute altered mental status. EXAM: CT HEAD WITHOUT CONTRAST TECHNIQUE: Contiguous axial  images were obtained from the base of the skull through the vertex without intravenous contrast. COMPARISON:  None. FINDINGS: Brain: Cerebral volume within normal limits for patient age. No evidence for acute intracranial hemorrhage. No findings to suggest acute large vessel territory infarct. No mass lesion, midline shift, or mass effect. Ventricles are normal in size without evidence for hydrocephalus. No extra-axial fluid collection identified. Vascular: No hyperdense  vessel identified. Skull: Scalp soft tissues demonstrate no acute abnormality. Calvarium intact. Sinuses/Orbits: Globes and orbital soft tissues within normal limits. Visualized paranasal sinuses are clear. No mastoid effusion. IMPRESSION: Negative head CT.  No acute intracranial abnormality identified. Electronically Signed   By: Jeannine Boga M.D.   On: 08/17/2018 02:16   Mr Foot Right W Wo Contrast  Result Date: 08/18/2018 CLINICAL DATA:  Nonhealing wound of the right great toe. EXAM: MRI OF THE RIGHT FOREFOOT WITHOUT AND WITH CONTRAST TECHNIQUE: Multiplanar, multisequence MR imaging of the right forefoot was performed before and after the administration of intravenous contrast. CONTRAST:  4 mL Gadavist COMPARISON:  None. FINDINGS: Bones/Joint/Cartilage Soft tissue wound of the great toe. No periosteal reaction or bone destruction. No aggressive osseous lesion. No fracture or dislocation. Normal alignment. No joint effusion. Mild osteoarthritis of the first MTP joint. Ligaments Collateral ligaments are intact. Muscles and Tendons Flexor, peroneal and extensor compartment tendons are intact. T2 hyperintense signal throughout the plantar musculature likely neurogenic. Soft tissue No fluid collection or hematoma.  No soft tissue mass. IMPRESSION: 1. Soft tissue wound of the great toe. No osteomyelitis of the right forefoot. Electronically Signed   By: Kathreen Devoid   On: 08/18/2018 10:58   US Arterial Abi (screening Lower  Extremity)  Result Date: 08/18/2018 CLINICAL DATA:  53 year old male with a history of ulcers EXAM: NONINVASIVE PHYSIOLOGIC VASCULAR STUDY OF BILATERAL LOWER EXTREMITIES TECHNIQUE: Evaluation of both lower extremities was performed at rest, including calculation of ankle-brachial indices, multiple segmental pressure evaluation, segmental Doppler and segmental pulse volume recording. COMPARISON:  None. FINDINGS: Right ABI:  Unobtainable Left ABI:  Unobtainable Right Lower Extremity: Noncompressible ankle arteries. Segmental Doppler at the right ankle demonstrates triphasic posterior tibial artery and biphasic dorsalis pedis. Left Lower Extremity: Noncompressible ankle arteries. Segmental Doppler at the left ankle demonstrates triphasic posterior tibial artery and dorsalis pedis. IMPRESSION: Resting ABI the bilateral lower extremities unobtainable secondary to noncompressible vessels. The segmental Doppler at the ankles demonstrates no evidence of significant arterial occlusive disease with maintained waveforms. Signed, Dulcy Fanny. Dellia Nims, RPVI [facility] Electronically Signed   By: Corrie Mckusick D.O.   On: 08/18/2018 11:30   Dg Foot 2 Views Right  Result Date: 08/17/2018 CLINICAL DATA:  Great toe ulcer. EXAM: RIGHT FOOT - 2 VIEW COMPARISON:  None. FINDINGS: No acute fracture or dislocation. Mild hallux valgus deformity with associated first MTP joint osteoarthritis. No cortical destruction or periosteal reaction. Soft tissue ulceration of the medial and plantar great toe at the level of the first IP joint. Bone mineralization is normal. Vascular calcifications. IMPRESSION: 1. Soft tissue ulceration of the medial and plantar great toe at the level of the first IP joint. No radiographic evidence of osteomyelitis. Electronically Signed   By: Titus Dubin M.D.   On: 08/17/2018 21:55    Scheduled Meds: . amLODipine  10 mg Oral Daily  . atorvastatin   10 mg Oral Daily  . docusate sodium  100 mg Oral BID  . feeding supplement (PRO-STAT SUGAR FREE 64)  30 mL Oral BID  . heparin  5,000 Units Subcutaneous Q8H  . Influenza vac split quadrivalent PF  0.5 mL Intramuscular Tomorrow-1000  . insulin aspart  0-15 Units Subcutaneous TID WC  . insulin aspart  0-5 Units Subcutaneous QHS  . insulin aspart  6 Units Subcutaneous TID WC  . insulin detemir  25 Units Subcutaneous Daily  . polyethylene glycol  17 g Oral Daily  Continuous Infusions: . sodium chloride Stopped (08/18/18 0947)  . 0.9 % NaCl with KCl 20 mEq / L 75 mL/hr at 08/18/18 1822  . pencillin G potassium IV 4 Million Units (08/18/18 1711)     LOS: 1 day    Time spent: 30 minutes    Barton Dubois, MD [facility] Pager [phone]  If 7PM-7AM, please contact night-coverage [email or web address] Password [facility] 08/18/2018, 7:24 PM

## 2018-08-18 NOTE — Progress Notes (Signed)
Dressing changed to right great toe as follows, wound bed covered with xeroform, 2x2 gauze and wrapped with kerlix, secured with tape, patient tolerated well. Patient denies any pain to site. Small purulent drainage and odor noted to site from old dressing that was taken off.

## 2018-08-18 NOTE — Progress Notes (Signed)
Dr. Olevia Bowens came to floor and RN spoke to him regarding patient.  Dr. Olevia Bowens has reviewed lab work and VS, hourly VS discontinued at this time.  Patient's VSS.  RN will continue to monitor patient and make MD aware of changes as needed.  P.J. Linus Mako, RN

## 2018-08-18 NOTE — Care Management Note (Signed)
Case Management Note  Patient Details  Name: Randy Oneal MRN: [id number] Date of Birth: [birth year]  Subjective/Objective:  Diabetic, hyperosmolar nonketotic state secondary to uncontrolled type 2 diabetes with poor medication compliance. AKI. From home with wife.                   Action/Plan: CM consulted for medication access, and potential home health/equipment needs. CM will follow and address. Currently patient has a podiatry consult, having fevers and tachycardia last night.   Expected Discharge Date:    unk              Expected Discharge Plan:  [place]  In-House Referral:  Clinical Social Work  Discharge planning Services  CM Consult  Post Acute Care Choice:    Choice offered to:     DME Arranged:    DME Agency:     HH Arranged:    [place] Agency:     Status of Service:  In process, will continue to follow  If discussed at Long Length of Stay Meetings, dates discussed:    Additional Comments:  Elzabeth Mcquerry, Chauncey Reading, RN 08/18/2018, 8:04 AM

## 2018-08-18 NOTE — Progress Notes (Signed)
CRITICAL VALUE ALERT  Critical Value:  Blood cultures: Anaerobic bottle only shows gram positive cocci in chains  Date & Time Notied: 08/18/2017 0239  Provider Notified: Dr. Olevia Bowens   Orders Received/Actions taken: awaiting response

## 2018-08-18 NOTE — Progress Notes (Addendum)
Inpatient Diabetes Program Recommendations  AACE/ADA: New Consensus Statement on Inpatient Glycemic Control  Target Ranges:  Prepandial:   less than 140 mg/dL      Peak postprandial:   less than 180 mg/dL (1-2 hours)      Critically ill patients:  140 - 180 mg/dL   Results for Randy Oneal, Randy Oneal (MRN [id number]) as of 08/18/2018 10:31  Ref. Range 08/17/2018 08:32 08/17/2018 10:23 08/17/2018 11:27 08/17/2018 17:05 08/17/2018 21:01 08/18/2018 07:13  Glucose-Capillary Latest Ref Range: 70 - 99 mg/dL 133 (H) 196 (H) 212 (H) 213 (H) 203 (H) 202 (H)   Review of Glycemic Control  Diabetes history: DM2 Outpatient Diabetes medications: Lantus 30 units QHS, Humalog 8 units TID with meals Current orders for Inpatient glycemic control: Levemir 25 units daily, Novolog 0-15 units TID with meals, Novolog 0-5 units QHS, Novolog 6 units TID with meals  Inpatient Diabetes Program Recommendations:   Insulin-Basal: Patient received Levemir 15 units on 08/17/18 at time of transition off IV insulin drip. Patient will receive Levemir 25 units today so anticipate increased Levemir dose will improve glycemic control.  HgbA1C: A1C 13% on 08/16/18 indicating an average glucose of 326 mg/dl over the past 2-3 months. Patient is interested in obtaining a prescription for the FreeStyle Libre 14 day reader and 14 day sensor. If, MD is willing to prescribe, please provide Rx for [name] reader (order # [id number]) and sensor (order # [id number]) at time of discharge. If not, please encourage patient to ask his PCP about prescribing it for him.  NOTE: Diabetes Coordinator spoke with patient on 08/17/18 over the phone.  No recommendations for insulin order changes at this time.Will continue to follow while inpatient.   Thanks, [name], RN, MSN, CDE Diabetes Coordinator Inpatient Diabetes Program [phone] (Team Pager from 8am to 5pm)

## 2018-08-18 NOTE — Progress Notes (Addendum)
Initial Nutrition Assessment  DOCUMENTATION CODES:      INTERVENTION:  -ProStat 30 ml BID (each 30 ml provides 100 kcal, 15 gr protein)   Provided education:  Nutrition therapy for  Wound Healing   CHO Counting for people with diabetes   NUTRITION DIAGNOSIS:   Food and nutrition related knowledge deficit related to limited prior education, wound healing as evidenced by per patient/family report and A1C-13%.    GOAL:  -Pt to meet >/= 90% of their estimated nutrition needs to promote tissue granulation.  -Patient will read handouts while in acute setting in order to avail himself of follow up questions with RD.  MONITOR:   PO intake, Labs, Weight trends  REASON FOR ASSESSMENT:   Consult Wound healing, Diet education  ASSESSMENT:  Patient is a 53 yo male with hx of poorly controlled diabetes, hypertension, renal insufficiency and non-healing wound to right great toe. WOC has assessed pt. MRI right foot findings: soft tissue wound- NO osteomyelitis. His appetite is good 75% of meals consumed.   Patient and daughter were provided nutrition education handouts and review regarding Wound Healing and CHO Counting for people with Diabetes. He also has been given and encouraged to read Living Well with Diabetes booklet.   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 He is not consistently checking his blood glucose and consumes sweeten beverages. Most days he doesn't eat until 1100 or after and usually eats dinner late. Discussed importance of controlled and consistent carbohydrate intake throughout the day.   Discussed different food groups and their effects on blood sugar, emphasizing carbohydrate-containing foods. Provided list of carbohydrates and recommended serving sizes of common foods.  Provided examples of ways to balance meals/snacks and encouraged intake of high-fiber, whole grain complex carbohydrates. Teach back method  used. Patient and daughter very receptive to education and asked multiple questions. RD contact provided for follow up questions if needed.   Medications reviewed and include: lipitor, SSI, Levimir   Lab Results  Component Value Date   HGBA1C 13.0 (A) 08/16/2018   Labs: Albumin 1.8 (low), WBC- 19.1 (H) BMP Latest Ref Rng & Units 08/18/2018 08/17/2018 08/17/2018  Glucose 70 - 99 mg/dL 212(H) 215(H) 188(H)  BUN 6 - 20 mg/dL 45(H) 56(H) 63(H)  Creatinine 0.61 - 1.24 mg/dL 1.71(H) 2.13(H) 2.34(H)  BUN/Creat Ratio 9 - 20 - - -  Sodium 135 - 145 mmol/L 131(L) 128(L) 126(L)  Potassium 3.5 - 5.1 mmol/L 3.6 3.4(L) 3.0(L)  Chloride 98 - 111 mmol/L 101 97(L) 94(L)  CO2 22 - 32 mmol/L 22 23 24   Calcium 8.9 - 10.3 mg/dL 7.4(L) 7.3(L) 7.2(L)      Diet Order:   Diet Order            Diet heart healthy/carb modified Room service appropriate? Yes; Fluid consistency: Thin  Diet effective 0500              EDUCATION NEEDS:   Education needs have been addressed(see note) Skin:  Skin Assessment: Skin Integrity Issues: Skin Integrity Issues:: (posterior great toe)  Last BM:  08/15/18  Height:   Ht Readings from Last 1 Encounters:  08/16/18 5\' 10"  (1.778 m)    Weight:   Wt Readings from Last 1 Encounters:  08/16/18 82.6 kg    Ideal Body Weight:  75 kg  BMI:  Body mass index is 26.11 kg/m.  Estimated Nutritional Needs:   Kcal:  2200-2300 (~30 kcal/kg/ibw)  Protein:  99-107 gr (1.2-1.3 gr/kg/bw)  Fluid:  >2 Liters daily   Colman Cater MS,RD,CSG,LDN Office: [phone] Pager: [phone]

## 2018-08-19 DIAGNOSIS — L03031 Cellulitis of right toe: Secondary | ICD-10-CM

## 2018-08-19 LAB — HIV ANTIBODY (ROUTINE TESTING W REFLEX): HIV Screen 4th Generation wRfx: NONREACTIVE

## 2018-08-19 LAB — GLUCOSE, CAPILLARY
GLUCOSE-CAPILLARY: 139 mg/dL — AB (ref 70–99)
Glucose-Capillary: 207 mg/dL — ABNORMAL HIGH (ref 70–99)
Glucose-Capillary: 179 mg/dL — ABNORMAL HIGH (ref 70–99)
Glucose-Capillary: 166 mg/dL — ABNORMAL HIGH (ref 70–99)

## 2018-08-19 LAB — CBC
HCT: 37.5 % — ABNORMAL LOW (ref 39.0–52.0)
Hemoglobin: 12.1 g/dL — ABNORMAL LOW (ref 13.0–17.0)
MCH: 25.3 pg — ABNORMAL LOW (ref 26.0–34.0)
MCHC: 32.3 g/dL (ref 30.0–36.0)
MCV: 78.3 fL — AB (ref 80.0–100.0)
PLATELETS: 238 10*3/uL (ref 150–400)
RBC: 4.79 MIL/uL (ref 4.22–5.81)
RDW: 14.2 % (ref 11.5–15.5)
WBC: 21.6 10*3/uL — ABNORMAL HIGH (ref 4.0–10.5)
nRBC: 0 % (ref 0.0–0.2)

## 2018-08-19 LAB — BASIC METABOLIC PANEL
Anion gap: 9 (ref 5–15)
BUN: 38 mg/dL — ABNORMAL HIGH (ref 6–20)
CO2: 19 mmol/L — ABNORMAL LOW (ref 22–32)
Calcium: 7.2 mg/dL — ABNORMAL LOW (ref 8.9–10.3)
Chloride: 102 mmol/L (ref 98–111)
Creatinine, Ser: 1.63 mg/dL — ABNORMAL HIGH (ref 0.61–1.24)
GFR calc Af Amer: 55 mL/min — ABNORMAL LOW (ref >60)
GFR calc non Af Amer: 48 mL/min — ABNORMAL LOW (ref >60)
Glucose, Bld: 173 mg/dL — ABNORMAL HIGH (ref 70–99)
Potassium: 4 mmol/L (ref 3.5–5.1)
Sodium: 130 mmol/L — ABNORMAL LOW (ref 135–145)

## 2018-08-19 MED ORDER — DICLOFENAC SODIUM 1 % TD GEL
4.0000 g | Freq: Four times a day (QID) | TRANSDERMAL | Status: AC
  Administered 2018-08-19: 4 g via TOPICAL
  Filled 2018-08-19 (×2): qty 100

## 2018-08-19 NOTE — Consult Note (Signed)
Reason for Consult: Right great toe cellulitis Referring Physician: Dr. Kathrine Cords III is an 53 y.o. male.  HPI: Patient is a 53 year old black male with a history of insulin-dependent diabetes mellitus, renal insufficiency who presented to the emergency room for dehydration and uncontrolled diabetes mellitus.  On examination, he was noted to have a right great toe callus with bruising.  Patient states he had a callus on his right great toe and he tried to shave it off on his own.  He denies any significant pain.  He does have sensation in his toes.  Work-up including ABI could not be performed due to noncompressible ankle vessels.  He did have triphasic blood flow of the pedal vessels.  MRI of the right foot is negative for osteomyelitis.  Patient denies any drainage from the callus.  Past Medical History:  Diagnosis Date  . Diabetes mellitus    Insulin dependent  . Hypertension   . Renal insufficiency     Past Surgical History:  Procedure Laterality Date  . COLONOSCOPY  06/24/2011   Procedure: COLONOSCOPY;  Surgeon: [name], MD;  Location: [facility];  Service: Endoscopy;  Laterality: N/A;  8:30 AM    Family History  Problem Relation Age of Onset  . Hypertension Mother   . Colon cancer Neg Hx     Social History:  reports that he has never smoked. He has never used smokeless tobacco. He reports that he does not drink alcohol or use drugs.  Allergies: No Known Allergies  Medications: I have reviewed the patient's current medications.  Results for orders placed or performed during the hospital encounter of 08/16/18 (from the past 48 hour(s))  CBG monitoring, ED     Status: Abnormal   Collection Time: 08/17/18  8:32 AM  Result Value Ref Range   Glucose-Capillary 133 (H) 70 - 99 mg/dL  CBG monitoring, ED     Status: Abnormal   Collection Time: 08/17/18 10:23 AM  Result Value Ref Range   Glucose-Capillary 196 (H) 70 - 99 mg/dL  Culture, blood (routine x 2)      Status: None (Preliminary result)   Collection Time: 08/17/18 10:55 AM  Result Value Ref Range   Specimen Description      BLOOD RIGHT ANTECUBITAL Performed at [facility], [address] [place], [place] [address]    Special Requests      BOTTLES DRAWN AEROBIC AND ANAEROBIC Blood Culture adequate volume Performed at [facility], [address], [place], [place] [address]    Culture  Setup Time      IN BOTH AEROBIC AND ANAEROBIC BOTTLES GRAM POSITIVE COCCI IN CHAINS Gram Stain Report Called to,Read Back By and Verified With: PJ SEXTON,RN @0239  08/18/18 MKELLY Organism ID to follow Performed at [facility], [place] [address] [place], [place] [address]    Culture GRAM POSITIVE COCCI    Report Status PENDING   Blood Culture ID Panel (Reflexed)     Status: Abnormal   Collection Time: 08/17/18 10:55 AM  Result Value Ref Range   Enterococcus species NOT DETECTED NOT DETECTED   Listeria monocytogenes NOT DETECTED NOT DETECTED   Staphylococcus species NOT DETECTED NOT DETECTED   Staphylococcus aureus (BCID) NOT DETECTED NOT DETECTED   Streptococcus species DETECTED (A) NOT DETECTED    Comment: CRITICAL RESULT CALLED TO, READ BACK BY AND VERIFIED WITH: Forest Gleason PharmD 9:45 08/18/18 ([email or web address])    Streptococcus agalactiae DETECTED (A) NOT DETECTED    Comment:  CRITICAL RESULT CALLED TO, READ BACK BY AND VERIFIED WITH: Forest Gleason PharmD 9:45 08/18/18 ([email or web address])    Streptococcus pneumoniae NOT DETECTED NOT DETECTED   Streptococcus pyogenes NOT DETECTED NOT DETECTED   Acinetobacter baumannii NOT DETECTED NOT DETECTED   Enterobacteriaceae species NOT DETECTED NOT DETECTED   Enterobacter cloacae complex NOT DETECTED NOT DETECTED   Escherichia coli NOT DETECTED NOT DETECTED   Klebsiella oxytoca NOT DETECTED NOT DETECTED   Klebsiella pneumoniae NOT DETECTED NOT DETECTED   Proteus species NOT DETECTED NOT DETECTED   Serratia marcescens NOT DETECTED NOT DETECTED   Haemophilus influenzae NOT  DETECTED NOT DETECTED   Neisseria meningitidis NOT DETECTED NOT DETECTED   Pseudomonas aeruginosa NOT DETECTED NOT DETECTED   Candida albicans NOT DETECTED NOT DETECTED   Candida glabrata NOT DETECTED NOT DETECTED   Candida krusei NOT DETECTED NOT DETECTED   Candida parapsilosis NOT DETECTED NOT DETECTED   Candida tropicalis NOT DETECTED NOT DETECTED    Comment: Performed at [place] [address] [place], [place] [address]  Culture, blood (routine x 2)     Status: None (Preliminary result)   Collection Time: 08/17/18 11:04 AM  Result Value Ref Range   Specimen Description BLOOD LEFT HAND    Special Requests      BOTTLES DRAWN AEROBIC ONLY Blood Culture results may not be optimal due to an inadequate volume of blood received in culture bottles   Culture  Setup Time      AEROBIC BOTTLE ONLY GRAM POSITIVE COCCI IN CHAINS Gram Stain Report Called to,Read Back By and Verified With: PJ SEXTON,RN @0235  08/18/18 MKELLY    Culture      NO GROWTH 2 DAYS Performed at [facility], [address] [place], [place] [address]    Report Status PENDING   Glucose, capillary     Status: Abnormal   Collection Time: 08/17/18 11:27 AM  Result Value Ref Range   Glucose-Capillary 212 (H) 70 - 99 mg/dL   Comment 1 Notify RN    Comment 2 Document in Chart   Glucose, capillary     Status: Abnormal   Collection Time: 08/17/18  5:05 PM  Result Value Ref Range   Glucose-Capillary 213 (H) 70 - 99 mg/dL   Comment 1 Notify RN    Comment 2 Document in Chart   Glucose, capillary     Status: Abnormal   Collection Time: 08/17/18  9:01 PM  Result Value Ref Range   Glucose-Capillary 203 (H) 70 - 99 mg/dL  CBC with Differential     Status: Abnormal   Collection Time: 08/17/18  9:49 PM  Result Value Ref Range   WBC 16.5 (H) 4.0 - 10.5 K/uL   RBC 4.71 4.22 - 5.81 MIL/uL   Hemoglobin 12.0 (L) 13.0 - 17.0 g/dL   HCT 36.7 (L) 39.0 - 52.0 %   MCV 77.9 (L) 80.0 - 100.0 fL   MCH 25.5 (L) 26.0 - 34.0  pg   MCHC 32.7 30.0 - 36.0 g/dL   RDW 13.4 11.5 - 15.5 %   Platelets 175 150 - 400 K/uL   nRBC 0.0 0.0 - 0.2 %   Neutrophils Relative % 79 %   Neutro Abs 13.0 (H) 1.7 - 7.7 K/uL   Lymphocytes Relative 6 %   Lymphs Abs 1.0 0.7 - 4.0 K/uL   Monocytes Relative 13 %   Monocytes Absolute 2.1 (H) 0.1 - 1.0 K/uL   Eosinophils Relative 0 %   Eosinophils Absolute 0.0  0.0 - 0.5 K/uL   Basophils Relative 0 %   Basophils Absolute 0.0 0.0 - 0.1 K/uL   Immature Granulocytes 2 %   Abs Immature Granulocytes 0.32 (H) 0.00 - 0.07 K/uL    Comment: Performed at [facility], [address], [place], [place] [address]  Comprehensive metabolic panel     Status: Abnormal   Collection Time: 08/17/18  9:49 PM  Result Value Ref Range   Sodium 128 (L) 135 - 145 mmol/L   Potassium 3.4 (L) 3.5 - 5.1 mmol/L   Chloride 97 (L) 98 - 111 mmol/L   CO2 23 22 - 32 mmol/L   Glucose, Bld 215 (H) 70 - 99 mg/dL   BUN 56 (H) 6 - 20 mg/dL   Creatinine, Ser 2.13 (H) 0.61 - 1.24 mg/dL   Calcium 7.3 (L) 8.9 - 10.3 mg/dL   Total Protein 4.9 (L) 6.5 - 8.1 g/dL   Albumin 1.6 (L) 3.5 - 5.0 g/dL   AST 89 (H) 15 - 41 U/L   ALT 45 (H) 0 - 44 U/L   Alkaline Phosphatase 171 (H) 38 - 126 U/L   Total Bilirubin 1.2 0.3 - 1.2 mg/dL   GFR calc non Af Amer 35 (L) >60 mL/min   GFR calc Af Amer 40 (L) >60 mL/min   Anion gap 8 5 - 15    Comment: Performed at [facility], [address] [place], [place] [address]  Lactic acid, plasma     Status: None   Collection Time: 08/17/18  9:49 PM  Result Value Ref Range   Lactic Acid, Venous 1.6 0.5 - 1.9 mmol/L    Comment: Performed at [facility], [address], [place],  [address]  Procalcitonin     Status: None   Collection Time: 08/17/18  9:49 PM  Result Value Ref Range   Procalcitonin 28.84 ng/mL    Comment:        Interpretation: PCT >= 10 ng/mL: Important systemic inflammatory response, almost exclusively due to severe bacterial sepsis or septic shock. (NOTE)        Sepsis PCT Algorithm           Lower Respiratory Tract                                      Infection PCT Algorithm    ----------------------------     ----------------------------         PCT < 0.25 ng/mL                PCT < 0.10 ng/mL         Strongly encourage             Strongly discourage   discontinuation of antibiotics    initiation of antibiotics    ----------------------------     -----------------------------       PCT 0.25 - 0.50 ng/mL            PCT 0.10 - 0.25 ng/mL               OR       >80% decrease in PCT            Discourage initiation of  antibiotics      Encourage discontinuation           of antibiotics    ----------------------------     -----------------------------         PCT >= 0.50 ng/mL              PCT 0.26 - 0.50 ng/mL                AND       <80% decrease in PCT             Encourage initiation of                                             antibiotics       Encourage continuation           of antibiotics    ----------------------------     -----------------------------        PCT >= 0.50 ng/mL                  PCT > 0.50 ng/mL               AND         increase in PCT                  Strongly encourage                                      initiation of antibiotics    Strongly encourage escalation           of antibiotics                                     -----------------------------                                           PCT <= 0.25 ng/mL                                                 OR                                        > 80% decrease in PCT                                     Discontinue / Do not initiate                                             antibiotics Performed at [facility], [address], [place], [place] [address]   Protime-INR     Status: None   Collection Time: 08/17/18  9:49 PM  Result Value Ref Range   Prothrombin Time 13.2 11.4 - 15.2 seconds   INR 1.01     Comment:  Performed at [facility], [address], [place], [place] [address]  APTT     Status: None   Collection Time: 08/17/18  9:49 PM  Result Value Ref Range   aPTT 32 24 - 36 seconds    Comment: Performed at [facility], [address], [place], [place] [address]  Sedimentation rate     Status: Abnormal   Collection Time: 08/17/18  9:49 PM  Result Value Ref Range   Sed Rate 81 (H) 0 - 16 mm/hr    Comment: Performed at [facility], [address] [place], [place] [address]  C-reactive protein     Status: Abnormal   Collection Time: 08/17/18  9:49 PM  Result Value Ref Range   CRP 30.0 (H) <1.0 mg/dL    Comment: Performed at [place] [address] [place], [place] [address]  Prealbumin     Status: Abnormal   Collection Time: 08/17/18  9:49 PM  Result Value Ref Range   Prealbumin <5.0 (L) 18 - 38 mg/dL    Comment: Performed at [place] [address] [place], [place] [address]  Lactic acid, plasma     Status: None   Collection Time: 08/18/18 12:06 AM  Result Value Ref Range   Lactic Acid, Venous 1.6 0.5 - 1.9 mmol/L    Comment: Performed at [facility], [address] [place],  [address]  CBC WITH DIFFERENTIAL     Status: Abnormal   Collection Time: 08/18/18  5:48 AM  Result Value Ref Range   WBC 19.1 (H) 4.0 - 10.5 K/uL   RBC 4.98 4.22 - 5.81 MIL/uL   Hemoglobin 12.5 (L) 13.0 - 17.0 g/dL   HCT 38.7 (L) 39.0 - 52.0 %   MCV 77.7 (L) 80.0 - 100.0 fL   MCH 25.1 (L) 26.0 - 34.0 pg   MCHC 32.3 30.0 - 36.0 g/dL   RDW 13.9 11.5 - 15.5 %   Platelets 181 150 - 400 K/uL   nRBC 0.0 0.0 - 0.2 %   Neutrophils Relative % 80 %   Neutro Abs 15.2 (H) 1.7 - 7.7 K/uL   Lymphocytes Relative 6 %   Lymphs Abs 1.2 0.7 - 4.0 K/uL   Monocytes Relative 12 %   Monocytes Absolute 2.2 (H) 0.1 - 1.0 K/uL   Eosinophils Relative 0 %   Eosinophils Absolute 0.0 0.0 - 0.5 K/uL   Basophils Relative 0 %   Basophils Absolute 0.1 0.0 - 0.1 K/uL   Immature Granulocytes 2 %   Abs  Immature Granulocytes 0.40 (H) 0.00 - 0.07 K/uL    Comment: Performed at [facility], [address] [place],  [address]  HIV antibody     Status: None   Collection Time: 08/18/18  5:48 AM  Result Value Ref Range   HIV Screen 4th Generation wRfx Non Reactive Non Reactive    Comment: (NOTE) Performed At: [facility] [place], [place] [id number] Rush Farmer MD QP:[id number]   Comprehensive metabolic panel     Status: Abnormal   Collection Time: 08/18/18  5:48 AM  Result Value Ref Range   Sodium 131 (L) 135 - 145 mmol/L   Potassium 3.6 3.5 - 5.1 mmol/L   Chloride 101 98 - 111 mmol/L   CO2 22 22 - 32 mmol/L   Glucose, Bld 212 (H)  70 - 99 mg/dL   BUN 45 (H) 6 - 20 mg/dL   Creatinine, Ser 1.71 (H) 0.61 - 1.24 mg/dL   Calcium 7.4 (L) 8.9 - 10.3 mg/dL   Total Protein 5.4 (L) 6.5 - 8.1 g/dL   Albumin 1.8 (L) 3.5 - 5.0 g/dL   AST 71 (H) 15 - 41 U/L   ALT 41 0 - 44 U/L   Alkaline Phosphatase 178 (H) 38 - 126 U/L   Total Bilirubin 1.4 (H) 0.3 - 1.2 mg/dL   GFR calc non Af Amer 45 (L) >60 mL/min   GFR calc Af Amer 52 (L) >60 mL/min   Anion gap 8 5 - 15    Comment: Performed at [facility], [address] [place], [place] [address]  Glucose, capillary     Status: Abnormal   Collection Time: 08/18/18  7:13 AM  Result Value Ref Range   Glucose-Capillary 202 (H) 70 - 99 mg/dL  Glucose, capillary     Status: Abnormal   Collection Time: 08/18/18 11:34 AM  Result Value Ref Range   Glucose-Capillary 240 (H) 70 - 99 mg/dL  Glucose, capillary     Status: Abnormal   Collection Time: 08/18/18  4:06 PM  Result Value Ref Range   Glucose-Capillary 190 (H) 70 - 99 mg/dL  Glucose, capillary     Status: Abnormal   Collection Time: 08/18/18  9:06 PM  Result Value Ref Range   Glucose-Capillary 183 (H) 70 - 99 mg/dL   Comment 1 Notify RN    Comment 2 Document in Chart     Mr Foot Right W Wo Contrast  Result Date: 08/18/2018 CLINICAL DATA:  Nonhealing wound of the  right great toe. EXAM: MRI OF THE RIGHT FOREFOOT WITHOUT AND WITH CONTRAST TECHNIQUE: Multiplanar, multisequence MR imaging of the right forefoot was performed before and after the administration of intravenous contrast. CONTRAST:  4 mL Gadavist COMPARISON:  None. FINDINGS: Bones/Joint/Cartilage Soft tissue wound of the great toe. No periosteal reaction or bone destruction. No aggressive osseous lesion. No fracture or dislocation. Normal alignment. No joint effusion. Mild osteoarthritis of the first MTP joint. Ligaments Collateral ligaments are intact. Muscles and Tendons Flexor, peroneal and extensor compartment tendons are intact. T2 hyperintense signal throughout the plantar musculature likely neurogenic. Soft tissue No fluid collection or hematoma.  No soft tissue mass. IMPRESSION: 1. Soft tissue wound of the great toe. No osteomyelitis of the right forefoot. Electronically Signed   By: Kathreen Devoid   On: 08/18/2018 10:58   US Arterial Abi (screening Lower Extremity)  Result Date: 08/18/2018 CLINICAL DATA:  53 year old male with a history of ulcers EXAM: NONINVASIVE PHYSIOLOGIC VASCULAR STUDY OF BILATERAL LOWER EXTREMITIES TECHNIQUE: Evaluation of both lower extremities was performed at rest, including calculation of ankle-brachial indices, multiple segmental pressure evaluation, segmental Doppler and segmental pulse volume recording. COMPARISON:  None. FINDINGS: Right ABI:  Unobtainable Left ABI:  Unobtainable Right Lower Extremity: Noncompressible ankle arteries. Segmental Doppler at the right ankle demonstrates triphasic posterior tibial artery and biphasic dorsalis pedis. Left Lower Extremity: Noncompressible ankle arteries. Segmental Doppler at the left ankle demonstrates triphasic posterior tibial artery and dorsalis pedis. IMPRESSION: Resting ABI the bilateral lower extremities unobtainable secondary to noncompressible vessels. The segmental Doppler at the ankles demonstrates no evidence of significant  arterial occlusive disease with maintained waveforms. Signed, Dulcy Fanny. Dellia Nims, RPVI [facility] Electronically Signed   By: Corrie Mckusick D.O.   On: 08/18/2018 11:30  Dg Foot 2 Views Right  Result Date: 08/17/2018 CLINICAL DATA:  Great toe ulcer. EXAM: RIGHT FOOT - 2 VIEW COMPARISON:  None. FINDINGS: No acute fracture or dislocation. Mild hallux valgus deformity with associated first MTP joint osteoarthritis. No cortical destruction or periosteal reaction. Soft tissue ulceration of the medial and plantar great toe at the level of the first IP joint. Bone mineralization is normal. Vascular calcifications. IMPRESSION: 1. Soft tissue ulceration of the medial and plantar great toe at the level of the first IP joint. No radiographic evidence of osteomyelitis. Electronically Signed   By: Titus Dubin M.D.   On: 08/17/2018 21:55    ROS:  Pertinent items are noted in HPI.  Blood pressure 139/81, pulse 94, temperature 99.8 F (37.7 C), temperature source Oral, resp. rate 17, height 5\' 10"  (1.778 m), weight 82.6 kg, SpO2 95 %. Physical Exam: Pleasant black male no acute distress Head is normocephalic, atraumatic Lungs clear to auscultation with good breath sounds bilaterally Heart examination reveals a regular rate and rhythm without S3, S4, murmurs Extremity examination reveals easily palpable bilateral femoral pulses.  In the right foot, a palpable dorsalis pedis and posterior tibial pulses are noted.  No significant right foot swelling or erythema is noted.  Along the medial aspect of the right great toe, a hard dark callus is present.  No significant drainage is noted.  No fluctuance is noted.  X-ray reports reviewed  Assessment/Plan: Impression: Right great toe callus with minimal cellulitis.  No drainable abscess present.  No osteomyelitis present. Plan: No need for acute surgical intervention at this time.  May keep right foot clean  and dry with soap and water.  Patient will need to be referred to podiatry as an outpatient for ongoing foot care.  Will follow peripherally with you.  Aviva Signs 08/19/2018, 7:42 AM

## 2018-08-19 NOTE — Progress Notes (Signed)
PROGRESS NOTE    Randy Oneal  DEY:[id number] DOB: 02/07/1966 DOA: 08/16/2018 PCP: [name], MD   Brief Narrative:  Per HPI: Randy Oneal is a 53 y.o. male with medical history significant of type 2 diabetes, hypertension, renal insufficiency who is coming to the emergency department referred by his PCP Dr. Wolfgang Phoenix due to dehydration and uncontrolled diabetes.  He is unable to provide further history as he is only oriented to name.  He follows simple commands, but quickly forgets while he is doing.  I spoke to his wife Randy Oneal who stated the patient has been having polyuria, polydipsia for a while.  She also stated that he has some visual hallucinations over the past 2 days.  To her knowledge, he has not had head trauma, fever, chills, dyspnea, cough, chest pain, abdominal pain, vomiting or diarrhea.  No further history is available at this time.  Patient was admitted with diabetic, hyperosmolar nonketotic state with poor compliance on home insulin regimen.  He was also noted to have sepsis due to diabetic foot ulcer and bacteremia.  Assessment & Plan:   Principal Problem:   AKI (acute kidney injury) ([place]) Active Problems:   Type 2 diabetes mellitus not at goal [facility])   HTN (hypertension), benign   Personal history of noncompliance with medical treatment, presenting hazards to health   Hyperlipidemia associated with type 2 diabetes mellitus ([place])   Hyponatremia   Diabetic hyperosmolar non-ketotic state ([place])   Hyperglycemia   Cellulitis of great toe, right   1. Diabetic, hyperosmolar nonketotic state secondary to uncontrolled type 2 diabetes with poor medication compliance.  Patient's reported to be noncompliant with his medications at home.  A1c 13%.  CBGs well controlled with current hypoglycemic regimen.  Follow fluctuation and adjust medication as needed. 2. Sepsis secondary to right foot diabetic ulcer and a strep bacteremia: Continue as needed antipyretics, continue IV  fluids, follow culture sensitivity.  Started on IV penicillin G 4 times a day. General surgery has been consulted and recommended no surgical debridement at this time.  Continue wound care and outpatient follow-up with podiatrist. 3. 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  Renal function has improved with fluid resuscitation.  Will continue to minimize nephrotoxic agents.  Renal function continue improving.  Follow trend. 4. Hyponatremia.  Likely secondary to element of dehydration as well as hyperglycemia.  This appears to be back to baseline.  Instructed to keep himself well-hydrated.  Continue to follow electrolytes intermittently. 5. Hypokalemia.  Improving within normal limits now. 6. Hypertension-currently controlled.  Continue as needed labetalol and amlodipine. 7. Dyslipidemia.  Continue home statin.   DVT prophylaxis: Heparin Code Status: Full Family Communication: None at bedside Disposition Plan: Remains inpatient.  General surgery has seen patient and do not recommend any debridement at this time.  We will continue providing wound care (using water and soap/dry dressing changes).  Continue antibiotics.  Follow clinical response.    Consultants:   General surgery  Procedures:   See below for x-ray reports.  Antimicrobials:   None  Subjective: No fevers appreciated, no chest pain, no nausea no vomiting.  Objective: Vitals:   08/18/18 2110 08/19/18 0502 08/19/18 1415 08/19/18 1707  BP: (!) 163/91 139/81 (!) 162/88   Pulse: (!) 108 94 (!) 105   Resp: 20 17 18    Temp: 99.6 F (37.6 C) 99.8 F (37.7 C) 99 F (37.2 C) 98.8 F (37.1 C)  TempSrc: Oral Oral Oral Oral  SpO2: 95% 95%  94%   Weight:      Height:        Intake/Output Summary (Last 24 hours) at 08/19/2018 1829 Last data filed at 08/19/2018 1300 Gross per 24 hour  Intake 1529.81 ml  Output -  Net 1529.81 ml   Filed Weights   08/16/18 1502  Weight: 82.6 kg    Examination: General exam: Alert, awake,  oriented x 3; currently afebrile, but reports chills. Denies chest pain, no nausea, no vomiting.  Overall feeling better. Respiratory system: Clear to auscultation. Respiratory effort normal. Cardiovascular system:RRR. No murmurs, rubs, gallops. Gastrointestinal system: Abdomen is nondistended, soft and nontender. No organomegaly or masses felt. Normal bowel sounds heard. Central nervous system: Alert and oriented. No focal neurological deficits. Extremities: No Cyanosis, no clubbing, no swelling. Positive right great toe callous, less red and with just mild tenderness on palpation now. No significant drainage appreciated today. Skin: No rashes, no petechiae.  Psychiatry: Judgement and insight appear normal. Mood & affect appropriate.   Data Reviewed: I have personally reviewed following labs and imaging studies  CBC: Recent Labs  Lab 08/16/18 1135 08/17/18 0427 08/17/18 2149 08/18/18 0548 08/19/18 1128  WBC 13.8* 10.7* 16.5* 19.1* 21.6*  NEUTROABS 9.6* 9.1* 13.0* 15.2*  --   HGB 14.1 11.9* 12.0* 12.5* 12.1*  HCT 43.0 35.1* 36.7* 38.7* 37.5*  MCV 78.3* 75.3* 77.9* 77.7* 78.3*  PLT 144* 130* 175 181 062   Basic Metabolic Panel: Recent Labs  Lab 08/16/18 2328 08/17/18 0427 08/17/18 2149 08/18/18 0548 08/19/18 0554  NA 122* 126* 128* 131* 130*  K 3.2* 3.0* 3.4* 3.6 4.0  CL 90* 94* 97* 101 102  CO2 22 24 23 22  19*  GLUCOSE 310* 188* 215* 212* 173*  BUN 63* 63* 56* 45* 38*  CREATININE 1.97* 2.34* 2.13* 1.71* 1.63*  CALCIUM 7.3* 7.2* 7.3* 7.4* 7.2*  MG 3.1*  --   --   --   --   PHOS 3.5  --   --   --   --    GFR: Estimated Creatinine Clearance: 54.7 mL/min (A) (by C-G formula based on SCr of 1.63 mg/dL (H)).   Liver Function Tests: Recent Labs  Lab 08/16/18 1135 08/17/18 0427 08/17/18 2149 08/18/18 0548  AST 44* 40 89* 71*  ALT 35 26 45* 41  ALKPHOS 160* 126 171* 178*  BILITOT 1.9* 1.0 1.2 1.4*  PROT 6.1* 4.7* 4.9* 5.4*  ALBUMIN 2.3* 1.7* 1.6* 1.8*    Coagulation Profile: Recent Labs  Lab 08/17/18 2149  INR 1.01   CBG: Recent Labs  Lab 08/18/18 1606 08/18/18 2106 08/19/18 0719 08/19/18 1136 08/19/18 1610  GLUCAP 190* 183* 207* 179* 166*   Sepsis Labs: Recent Labs  Lab 08/17/18 2149 08/18/18 0006  PROCALCITON 28.84  --   LATICACIDVEN 1.6 1.6    Recent Results (from the past 240 hour(s))  Culture, blood (routine x 2)     Status: Abnormal (Preliminary result)   Collection Time: 08/17/18 10:55 AM  Result Value Ref Range Status   Specimen Description   Final    BLOOD RIGHT ANTECUBITAL Performed at [facility], [address] [place], [place] [address]    Special Requests   Final    BOTTLES DRAWN AEROBIC AND ANAEROBIC Blood Culture adequate volume Performed at [facility], [address], [place], [place] [address]    Culture  Setup Time   Final    IN BOTH AEROBIC AND ANAEROBIC BOTTLES GRAM POSITIVE COCCI IN CHAINS Gram Stain Report Called  to,Read Back By and Verified With: PJ SEXTON,RN @0239  08/18/18 MKELLY Organism ID to follow    Culture (A)  Final    GROUP B STREP(S.AGALACTIAE)ISOLATED SUSCEPTIBILITIES TO FOLLOW Performed at [facility], [place] [address] [place], [place] [address]    Report Status PENDING  Incomplete  Blood Culture ID Panel (Reflexed)     Status: Abnormal   Collection Time: 08/17/18 10:55 AM  Result Value Ref Range Status   Enterococcus species NOT DETECTED NOT DETECTED Final   Listeria monocytogenes NOT DETECTED NOT DETECTED Final   Staphylococcus species NOT DETECTED NOT DETECTED Final   Staphylococcus aureus (BCID) NOT DETECTED NOT DETECTED Final   Streptococcus species DETECTED (A) NOT DETECTED Final    Comment: CRITICAL RESULT CALLED TO, READ BACK BY AND VERIFIED WITH: Forest Gleason PharmD 9:45 08/18/18 ([email or web address])    Streptococcus agalactiae DETECTED (A) NOT DETECTED Final    Comment: CRITICAL RESULT CALLED TO, READ BACK BY AND VERIFIED WITH: Forest Gleason PharmD 9:45 08/18/18  ([email or web address])    Streptococcus pneumoniae NOT DETECTED NOT DETECTED Final   Streptococcus pyogenes NOT DETECTED NOT DETECTED Final   Acinetobacter baumannii NOT DETECTED NOT DETECTED Final   Enterobacteriaceae species NOT DETECTED NOT DETECTED Final   Enterobacter cloacae complex NOT DETECTED NOT DETECTED Final   Escherichia coli NOT DETECTED NOT DETECTED Final   Klebsiella oxytoca NOT DETECTED NOT DETECTED Final   Klebsiella pneumoniae NOT DETECTED NOT DETECTED Final   Proteus species NOT DETECTED NOT DETECTED Final   Serratia marcescens NOT DETECTED NOT DETECTED Final   Haemophilus influenzae NOT DETECTED NOT DETECTED Final   Neisseria meningitidis NOT DETECTED NOT DETECTED Final   Pseudomonas aeruginosa NOT DETECTED NOT DETECTED Final   Candida albicans NOT DETECTED NOT DETECTED Final   Candida glabrata NOT DETECTED NOT DETECTED Final   Candida krusei NOT DETECTED NOT DETECTED Final   Candida parapsilosis NOT DETECTED NOT DETECTED Final   Candida tropicalis NOT DETECTED NOT DETECTED Final    Comment: Performed at [facility], [place]. [address], [place], [place] [address]  Culture, blood (routine x 2)     Status: Abnormal (Preliminary result)   Collection Time: 08/17/18 11:04 AM  Result Value Ref Range Status   Specimen Description   Final    BLOOD LEFT HAND Performed at [facility], [address], [place], [place] [address]    Special Requests   Final    BOTTLES DRAWN AEROBIC ONLY Blood Culture results may not be optimal due to an inadequate volume of blood received in culture bottles Performed at [facility], [address] [place], [place] [address]    Culture  Setup Time   Final    AEROBIC BOTTLE ONLY GRAM POSITIVE COCCI IN CHAINS Gram Stain Report Called to,Read Back By and Verified With: PJ SEXTON,RN @0235  08/18/18 [facility] Performed at [facility], [address], [place], [place] [address]    Culture GROUP B STREP(S.AGALACTIAE)ISOLATED (A)  Final   Report Status  PENDING  Incomplete     Radiology Studies: Mr Foot Right W Wo Contrast  Result Date: 08/18/2018 CLINICAL DATA:  Nonhealing wound of the right great toe. EXAM: MRI OF THE RIGHT FOREFOOT WITHOUT AND WITH CONTRAST TECHNIQUE: Multiplanar, multisequence MR imaging of the right forefoot was performed before and after the administration of intravenous contrast. CONTRAST:  4 mL Gadavist COMPARISON:  None. FINDINGS: Bones/Joint/Cartilage Soft tissue wound of the great toe. No periosteal reaction or bone destruction. No aggressive osseous lesion. No fracture or  dislocation. Normal alignment. No joint effusion. Mild osteoarthritis of the first MTP joint. Ligaments Collateral ligaments are intact. Muscles and Tendons Flexor, peroneal and extensor compartment tendons are intact. T2 hyperintense signal throughout the plantar musculature likely neurogenic. Soft tissue No fluid collection or hematoma.  No soft tissue mass. IMPRESSION: 1. Soft tissue wound of the great toe. No osteomyelitis of the right forefoot. Electronically Signed   By: Kathreen Devoid   On: 08/18/2018 10:58   US Arterial Abi (screening Lower Extremity)  Result Date: 08/18/2018 CLINICAL DATA:  53 year old male with a history of ulcers EXAM: NONINVASIVE PHYSIOLOGIC VASCULAR STUDY OF BILATERAL LOWER EXTREMITIES TECHNIQUE: Evaluation of both lower extremities was performed at rest, including calculation of ankle-brachial indices, multiple segmental pressure evaluation, segmental Doppler and segmental pulse volume recording. COMPARISON:  None. FINDINGS: Right ABI:  Unobtainable Left ABI:  Unobtainable Right Lower Extremity: Noncompressible ankle arteries. Segmental Doppler at the right ankle demonstrates triphasic posterior tibial artery and biphasic dorsalis pedis. Left Lower Extremity: Noncompressible ankle arteries. Segmental Doppler at the left ankle demonstrates triphasic posterior tibial artery and dorsalis pedis. IMPRESSION: Resting ABI the bilateral  lower extremities unobtainable secondary to noncompressible vessels. The segmental Doppler at the ankles demonstrates no evidence of significant arterial occlusive disease with maintained waveforms. Signed, Dulcy Fanny. Dellia Nims, RPVI [name] Radiology Electronically Signed   By: Corrie Mckusick D.O.   On: 08/18/2018 11:30   Dg Foot 2 Views Right  Result Date: 08/17/2018 CLINICAL DATA:  Great toe ulcer. EXAM: RIGHT FOOT - 2 VIEW COMPARISON:  None. FINDINGS: No acute fracture or dislocation. Mild hallux valgus deformity with associated first MTP joint osteoarthritis. No cortical destruction or periosteal reaction. Soft tissue ulceration of the medial and plantar great toe at the level of the first IP joint. Bone mineralization is normal. Vascular calcifications. IMPRESSION: 1. Soft tissue ulceration of the medial and plantar great toe at the level of the first IP joint. No radiographic evidence of osteomyelitis. Electronically Signed   By: Titus Dubin M.D.   On: 08/17/2018 21:55    Scheduled Meds: . amLODipine  10 mg Oral Daily  . atorvastatin  10 mg Oral Daily  . docusate sodium  100 mg Oral BID  . feeding supplement (PRO-STAT SUGAR FREE 64)  30 mL Oral BID  . heparin  5,000 Units Subcutaneous Q8H  . Influenza vac split quadrivalent PF  0.5 mL Intramuscular Tomorrow-1000  . insulin aspart  0-15 Units Subcutaneous TID WC  . insulin aspart  0-5 Units Subcutaneous QHS  . insulin aspart  6 Units Subcutaneous TID WC  . insulin detemir  25 Units Subcutaneous Daily  . polyethylene glycol  17 g Oral Daily   Continuous Infusions: . sodium chloride Stopped (08/18/18 0947)  . 0.9 % NaCl with KCl 20 mEq / L 75 mL/hr at 08/19/18 0300  . pencillin G potassium IV 4 Million Units (08/19/18 1707)     LOS: 2 days    Time spent: 30 minutes    Barton Dubois, MD [facility] Pager [phone]  If 7PM-7AM, please contact  night-coverage [email or web address] Password [facility] 08/19/2018, 6:29 PM

## 2018-08-20 ENCOUNTER — Telehealth: Payer: Self-pay | Admitting: Family Medicine

## 2018-08-20 ENCOUNTER — Encounter: Payer: Self-pay | Admitting: Endocrinology

## 2018-08-20 DIAGNOSIS — Z029 Encounter for administrative examinations, unspecified: Secondary | ICD-10-CM

## 2018-08-20 LAB — GLUCOSE, CAPILLARY
Glucose-Capillary: 279 mg/dL — ABNORMAL HIGH (ref 70–99)
Glucose-Capillary: 205 mg/dL — ABNORMAL HIGH (ref 70–99)
Glucose-Capillary: 114 mg/dL — ABNORMAL HIGH (ref 70–99)

## 2018-08-20 LAB — CULTURE, BLOOD (ROUTINE X 2): Special Requests: ADEQUATE

## 2018-08-20 MED ORDER — INSULIN DETEMIR 100 UNIT/ML ~~LOC~~ SOLN
30.0000 [IU] | Freq: Every day | SUBCUTANEOUS | Status: DC
  Administered 2018-08-21 – 2018-08-26 (×6): 30 [IU] via SUBCUTANEOUS
  Filled 2018-08-20 (×9): qty 0.3

## 2018-08-20 MED ORDER — INSULIN ASPART 100 UNIT/ML ~~LOC~~ SOLN
8.0000 [IU] | Freq: Three times a day (TID) | SUBCUTANEOUS | Status: DC
  Administered 2018-08-20 – 2018-08-29 (×21): 8 [IU] via SUBCUTANEOUS

## 2018-08-20 NOTE — Progress Notes (Signed)
Inpatient Diabetes Program Recommendations  AACE/ADA: New Consensus Statement on Inpatient Glycemic Control (2015)  Target Ranges:  Prepandial:   less than 140 mg/dL      Peak postprandial:   less than 180 mg/dL (1-2 hours)      Critically ill patients:  140 - 180 mg/dL   Lab Results  Component Value Date   GLUCAP 205 (H) 08/20/2018   HGBA1C 13.0 (A) 08/16/2018    Review of Glycemic Control  FBS 279 mg/dL this am.  Recommendations:  Increase Levemir to 30 units QD Increase Novolog to 8 units tidwc for meal coverage insulin.  Will continue to follow.  Thank you. Lorenda Peck, RD, LDN, CDE Inpatient Diabetes Coordinator [phone]

## 2018-08-20 NOTE — Progress Notes (Signed)
PROGRESS NOTE    Randy Oneal  TMH:[id number] DOB: [birth year] DOA: 08/16/2018 PCP: [name], MD   Brief Narrative:  Per HPI: Randy Oneal is a 53 y.o. male with medical history significant of type 2 diabetes, hypertension, renal insufficiency who is coming to the emergency department referred by his PCP Dr. Wolfgang Phoenix due to dehydration and uncontrolled diabetes.  He is unable to provide further history as he is only oriented to name.  He follows simple commands, but quickly forgets while he is doing.  I spoke to his wife Randy Oneal who stated the patient has been having polyuria, polydipsia for a while.  She also stated that he has some visual hallucinations over the past 2 days.  To her knowledge, he has not had head trauma, fever, chills, dyspnea, cough, chest pain, abdominal pain, vomiting or diarrhea.  No further history is available at this time.  Patient was admitted with diabetic, hyperosmolar nonketotic state with poor compliance on home insulin regimen.  He was also noted to have sepsis due to diabetic foot ulcer and bacteremia.  Assessment & Plan:   Principal Problem:   AKI (acute kidney injury) ([place]) Active Problems:   Type 2 diabetes mellitus not at goal [facility])   HTN (hypertension), benign   Personal history of noncompliance with medical treatment, presenting hazards to health   Hyperlipidemia associated with type 2 diabetes mellitus ([place])   Hyponatremia   Diabetic hyperosmolar non-ketotic state ([place])   Hyperglycemia   Cellulitis of great toe, right   1. Diabetic, hyperosmolar nonketotic state secondary to uncontrolled type 2 diabetes with poor medication compliance.  Patient's reported to be noncompliant with his medications at home.  A1c 13%.  CBGs rising; will adjust levemir and TID novolog meal coverage. Continue to follow CBG's trend.  2. Sepsis secondary to right foot diabetic ulcer and a strep bacteremia: Continue as needed antipyretics, continue IV fluids, follow  culture sensitivity.  Will continue IV penicillin G 4 times a day for another 24 hours. General surgery has been consulted and recommended no surgical debridement at this time.  Continue wound care and outpatient follow-up with podiatrist. still with elevated WBC's and low grade temp overnight.  3. 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  Renal function has improved with fluid resuscitation.  Will continue to minimize nephrotoxic agents.  Renal function continue improving. Continue to Follow trend. 4. Hyponatremia.  Likely secondary to element of dehydration as well as hyperglycemia.  This appears to be back to baseline.  Instructed to keep himself well-hydrated.  Continue to follow electrolytes intermittently. 5. Hypokalemia.  Improved and within normal limits now. 6. Hypertension-currently controlled.  Continue as needed labetalol and amlodipine. 7. Dyslipidemia.  Continue home statin.   DVT prophylaxis: Heparin Code Status: Full Family Communication: None at bedside Disposition Plan: Remains inpatient.  Low-grade temperature overnight still with elevated WBCs.  Repeat blood culture pending.  Continue IV antibiotics.  Continue wound care.  Adjust hypoglycemic regimen..    Consultants:   General surgery  Procedures:   See below for x-ray reports.  Antimicrobials:   None  Subjective: Afebrile currently.  Reports improving appetite.  No chest pain, no shortness of breath.  Objective: Vitals:   08/19/18 1707 08/19/18 2200 08/20/18 0551 08/20/18 1501  BP:  (!) 154/88 (!) 170/88 (!) 173/90  Pulse:  (!) 111 (!) 104 (!) 111  Resp:  18 17 18   Temp: 98.8 F (37.1 C) 99.8 F (37.7 C) 99.7 F (37.6 C) 99.6  F (37.6 C)  TempSrc: Oral Oral Oral Oral  SpO2:  96% 95% 96%  Weight:      Height:        Intake/Output Summary (Last 24 hours) at 08/20/2018 1616 Last data filed at 08/20/2018 1517 Gross per 24 hour  Intake 4824.97 ml  Output 1725 ml  Net 3099.97 ml   Filed Weights    08/16/18 1502  Weight: 82.6 kg    Examination: General exam: Alert, awake, oriented x 3; afebrile currently; but had low-grade temp overnight. He denies chest pain, no nausea, no vomiting.  Reports improved in his appetite and is feeling better. Respiratory system: Clear to auscultation. Respiratory effort normal. Cardiovascular system:RRR. No murmurs, rubs, gallops. Gastrointestinal system: Abdomen is nondistended, soft and nontender. No organomegaly or masses felt. Normal bowel sounds heard. Central nervous system: Alert and oriented. No focal neurological deficits. Extremities: No cyanosis or clubbing. Skin: No petechiae, positive right great toe callus, erythema continue improving and just mild tenderness on palpation.  No significant drainage appreciated. Psychiatry: Judgement and insight appear normal. Mood & affect appropriate.   Data Reviewed: I have personally reviewed following labs and imaging studies  CBC: Recent Labs  Lab 08/16/18 1135 08/17/18 0427 08/17/18 2149 08/18/18 0548 08/19/18 1128  WBC 13.8* 10.7* 16.5* 19.1* 21.6*  NEUTROABS 9.6* 9.1* 13.0* 15.2*  --   HGB 14.1 11.9* 12.0* 12.5* 12.1*  HCT 43.0 35.1* 36.7* 38.7* 37.5*  MCV 78.3* 75.3* 77.9* 77.7* 78.3*  PLT 144* 130* 175 181 789   Basic Metabolic Panel: Recent Labs  Lab 08/16/18 2328 08/17/18 0427 08/17/18 2149 08/18/18 0548 08/19/18 0554  NA 122* 126* 128* 131* 130*  K 3.2* 3.0* 3.4* 3.6 4.0  CL 90* 94* 97* 101 102  CO2 22 24 23 22  19*  GLUCOSE 310* 188* 215* 212* 173*  BUN 63* 63* 56* 45* 38*  CREATININE 1.97* 2.34* 2.13* 1.71* 1.63*  CALCIUM 7.3* 7.2* 7.3* 7.4* 7.2*  MG 3.1*  --   --   --   --   PHOS 3.5  --   --   --   --    GFR: Estimated Creatinine Clearance: 54.7 mL/min (A) (by C-G formula based on SCr of 1.63 mg/dL (H)).   Liver Function Tests: Recent Labs  Lab 08/16/18 1135 08/17/18 0427 08/17/18 2149 08/18/18 0548  AST 44* 40 89* 71*  ALT 35 26 45* 41  ALKPHOS 160* 126  171* 178*  BILITOT 1.9* 1.0 1.2 1.4*  PROT 6.1* 4.7* 4.9* 5.4*  ALBUMIN 2.3* 1.7* 1.6* 1.8*   Coagulation Profile: Recent Labs  Lab 08/17/18 2149  INR 1.01   CBG: Recent Labs  Lab 08/19/18 1136 08/19/18 1610 08/19/18 2203 08/20/18 0738 08/20/18 1126  GLUCAP 179* 166* 139* 279* 205*   Sepsis Labs: Recent Labs  Lab 08/17/18 2149 08/18/18 0006  PROCALCITON 28.84  --   LATICACIDVEN 1.6 1.6    Recent Results (from the past 240 hour(s))  Culture, blood (routine x 2)     Status: Abnormal   Collection Time: 08/17/18 10:55 AM  Result Value Ref Range Status   Specimen Description BLOOD RIGHT ANTECUBITAL  Final   Special Requests   Final    BOTTLES DRAWN AEROBIC AND ANAEROBIC Blood Culture adequate volume   Culture  Setup Time   Final    IN BOTH AEROBIC AND ANAEROBIC BOTTLES GRAM POSITIVE COCCI IN CHAINS Gram Stain Report Called to,Read Back By and Verified With: PJ SEXTON,RN @0239  08/18/18 MKELLY Organism ID  to follow    Culture GROUP B STREP(S.AGALACTIAE)ISOLATED (A)  Final   Report Status 08/20/2018 FINAL  Final   Organism ID, Bacteria GROUP B STREP(S.AGALACTIAE)ISOLATED  Final      Susceptibility   Group b strep(s.agalactiae)isolated - MIC*    CLINDAMYCIN <=0.25 SENSITIVE Sensitive     AMPICILLIN <=0.25 SENSITIVE Sensitive     ERYTHROMYCIN <=0.12 SENSITIVE Sensitive     VANCOMYCIN 0.5 SENSITIVE Sensitive     CEFTRIAXONE <=0.12 SENSITIVE Sensitive     LEVOFLOXACIN 4 INTERMEDIATE Intermediate     PENICILLIN Value in next row Sensitive      SENSITIVE<=0.12Performed at [facility], [place] [address], [place], [place] [address]    * GROUP B STREP(S.AGALACTIAE)ISOLATED  Blood Culture ID Panel (Reflexed)     Status: Abnormal   Collection Time: 08/17/18 10:55 AM  Result Value Ref Range Status   Enterococcus species NOT DETECTED NOT DETECTED Final   Listeria monocytogenes NOT DETECTED NOT DETECTED Final   Staphylococcus species NOT DETECTED NOT DETECTED Final    Staphylococcus aureus (BCID) NOT DETECTED NOT DETECTED Final   Streptococcus species DETECTED (A) NOT DETECTED Final    Comment: CRITICAL RESULT CALLED TO, READ BACK BY AND VERIFIED WITH: Forest Gleason PharmD 9:45 08/18/18 ([email or web address])    Streptococcus agalactiae DETECTED (A) NOT DETECTED Final    Comment: CRITICAL RESULT CALLED TO, READ BACK BY AND VERIFIED WITH: Forest Gleason PharmD 9:45 08/18/18 ([email or web address])    Streptococcus pneumoniae NOT DETECTED NOT DETECTED Final   Streptococcus pyogenes NOT DETECTED NOT DETECTED Final   Acinetobacter baumannii NOT DETECTED NOT DETECTED Final   Enterobacteriaceae species NOT DETECTED NOT DETECTED Final   Enterobacter cloacae complex NOT DETECTED NOT DETECTED Final   Escherichia coli NOT DETECTED NOT DETECTED Final   Klebsiella oxytoca NOT DETECTED NOT DETECTED Final   Klebsiella pneumoniae NOT DETECTED NOT DETECTED Final   Proteus species NOT DETECTED NOT DETECTED Final   Serratia marcescens NOT DETECTED NOT DETECTED Final   Haemophilus influenzae NOT DETECTED NOT DETECTED Final   Neisseria meningitidis NOT DETECTED NOT DETECTED Final   Pseudomonas aeruginosa NOT DETECTED NOT DETECTED Final   Candida albicans NOT DETECTED NOT DETECTED Final   Candida glabrata NOT DETECTED NOT DETECTED Final   Candida krusei NOT DETECTED NOT DETECTED Final   Candida parapsilosis NOT DETECTED NOT DETECTED Final   Candida tropicalis NOT DETECTED NOT DETECTED Final    Comment: Performed at [facility], [place]. [address], [place], [place] [address]  Culture, blood (routine x 2)     Status: Abnormal   Collection Time: 08/17/18 11:04 AM  Result Value Ref Range Status   Specimen Description   Final    BLOOD LEFT HAND Performed at [facility], [address], [place], [place] [address]    Special Requests   Final    BOTTLES DRAWN AEROBIC ONLY Blood Culture results may not be optimal due to an inadequate volume of blood received in culture bottles Performed at [facility],  [address], [place], [place] [address]    Culture  Setup Time   Final    AEROBIC BOTTLE ONLY GRAM POSITIVE COCCI IN CHAINS Gram Stain Report Called to,Read Back By and Verified With: PJ SEXTON,RN @0235  08/18/18 [facility] Performed at [facility], [address] [place], [place] [address]    Culture (A)  Final    GROUP B STREP(S.AGALACTIAE)ISOLATED SUSCEPTIBILITIES PERFORMED ON PREVIOUS CULTURE WITHIN THE LAST 5 DAYS. Performed at [facility], [place]  [address], [place], [place] [address]    Report Status 08/20/2018 FINAL  Final  Culture, blood (Routine X 2) w Reflex to ID Panel     Status: None (Preliminary result)   Collection Time: 08/19/18  6:58 PM  Result Value Ref Range Status   Specimen Description BLOOD RIGHT HAND  Final   Special Requests   Final    BOTTLES DRAWN AEROBIC AND ANAEROBIC Blood Culture adequate volume   Culture   Final    NO GROWTH < 12 HOURS Performed at [facility], [address], [place], [place] [address]    Report Status PENDING  Incomplete  Culture, blood (Routine X 2) w Reflex to ID Panel     Status: None (Preliminary result)   Collection Time: 08/19/18  7:07 PM  Result Value Ref Range Status   Specimen Description BLOOD LEFT HAND  Final   Special Requests   Final    BOTTLES DRAWN AEROBIC AND ANAEROBIC Blood Culture adequate volume   Culture   Final    NO GROWTH < 12 HOURS Performed at [facility], [address], [place], [place] [address]    Report Status PENDING  Incomplete     Radiology Studies: No results found.  Scheduled Meds: . amLODipine  10 mg Oral Daily  . atorvastatin  10 mg Oral Daily  . docusate sodium  100 mg Oral BID  . feeding supplement (PRO-STAT SUGAR FREE 64)  30 mL Oral BID  . heparin  5,000 Units Subcutaneous Q8H  . Influenza vac split quadrivalent PF  0.5 mL Intramuscular Tomorrow-1000  . insulin aspart  0-15 Units Subcutaneous TID WC  . insulin aspart  0-5 Units Subcutaneous QHS  . insulin aspart  8 Units  Subcutaneous TID WC  . [START ON 08/21/2018] insulin detemir  30 Units Subcutaneous Daily  . polyethylene glycol  17 g Oral Daily   Continuous Infusions: . sodium chloride Stopped (08/18/18 0947)  . 0.9 % NaCl with KCl 20 mEq / L 75 mL/hr at 08/20/18 1500  . pencillin G potassium IV 4 Million Units (08/20/18 1226)     LOS: 3 days    Time spent: 30 minutes    Barton Dubois, MD [facility] Pager [phone]  If 7PM-7AM, please contact night-coverage [email or web address] Password [email or web address] 08/20/2018, 4:16 PM

## 2018-08-20 NOTE — Telephone Encounter (Signed)
Patient had short-term disability form faxed over to our office to be completed please review and fill in highlighted areas. In your box.

## 2018-08-21 LAB — CBC
HCT: 32.4 % — ABNORMAL LOW (ref 39.0–52.0)
Hemoglobin: 10.5 g/dL — ABNORMAL LOW (ref 13.0–17.0)
MCH: 25.4 pg — ABNORMAL LOW (ref 26.0–34.0)
MCHC: 32.4 g/dL (ref 30.0–36.0)
MCV: 78.3 fL — ABNORMAL LOW (ref 80.0–100.0)
NRBC: 0 % (ref 0.0–0.2)
Platelets: 303 10*3/uL (ref 150–400)
RBC: 4.14 MIL/uL — AB (ref 4.22–5.81)
RDW: 14.1 % (ref 11.5–15.5)
WBC: 21.1 10*3/uL — ABNORMAL HIGH (ref 4.0–10.5)

## 2018-08-21 LAB — BASIC METABOLIC PANEL
Anion gap: 7 (ref 5–15)
BUN: 30 mg/dL — ABNORMAL HIGH (ref 6–20)
CO2: 22 mmol/L (ref 22–32)
Calcium: 7.1 mg/dL — ABNORMAL LOW (ref 8.9–10.3)
Chloride: 100 mmol/L (ref 98–111)
Creatinine, Ser: 1.56 mg/dL — ABNORMAL HIGH (ref 0.61–1.24)
GFR calc Af Amer: 58 mL/min — ABNORMAL LOW (ref >60)
GFR calc non Af Amer: 50 mL/min — ABNORMAL LOW (ref >60)
Glucose, Bld: 233 mg/dL — ABNORMAL HIGH (ref 70–99)
Potassium: 4.2 mmol/L (ref 3.5–5.1)
Sodium: 129 mmol/L — ABNORMAL LOW (ref 135–145)

## 2018-08-21 LAB — GLUCOSE, CAPILLARY
GLUCOSE-CAPILLARY: 235 mg/dL — AB (ref 70–99)
Glucose-Capillary: 233 mg/dL — ABNORMAL HIGH (ref 70–99)
Glucose-Capillary: 233 mg/dL — ABNORMAL HIGH (ref 70–99)
Glucose-Capillary: 125 mg/dL — ABNORMAL HIGH (ref 70–99)

## 2018-08-21 MED ORDER — NYSTATIN 100000 UNIT/GM EX POWD
Freq: Two times a day (BID) | CUTANEOUS | Status: DC
  Administered 2018-08-21 – 2018-09-03 (×27): via TOPICAL
  Administered 2018-09-04: 1 g via TOPICAL
  Administered 2018-09-05: 09:00:00 via TOPICAL
  Filled 2018-08-21 (×4): qty 15

## 2018-08-21 MED ORDER — SODIUM CHLORIDE 0.9 % IV BOLUS
1000.0000 mL | Freq: Once | INTRAVENOUS | Status: AC
  Administered 2018-08-21: 1000 mL via INTRAVENOUS

## 2018-08-21 NOTE — Progress Notes (Signed)
PROGRESS NOTE    Randy Oneal  DDU:[id number] DOB: [birth year] DOA: 08/16/2018 PCP: [name], MD   Brief Narrative:  Per HPI: Randy Oneal is a 53 y.o. male with medical history significant of type 2 diabetes, hypertension, renal insufficiency who is coming to the emergency department referred by his PCP Dr. Wolfgang Phoenix due to dehydration and uncontrolled diabetes.  He is unable to provide further history as he is only oriented to name.  He follows simple commands, but quickly forgets while he is doing.  I spoke to his wife Randy Oneal who stated the patient has been having polyuria, polydipsia for a while.  She also stated that he has some visual hallucinations over the past 2 days.  To her knowledge, he has not had head trauma, fever, chills, dyspnea, cough, chest pain, abdominal pain, vomiting or diarrhea.  No further history is available at this time.  Patient was admitted with diabetic, hyperosmolar nonketotic state with poor compliance on home insulin regimen.  He was also noted to have sepsis due to diabetic foot ulcer and bacteremia.  Assessment & Plan:   Principal Problem:   AKI (acute kidney injury) ([place]) Active Problems:   Type 2 diabetes mellitus not at goal [facility])   HTN (hypertension), benign   Personal history of noncompliance with medical treatment, presenting hazards to health   Hyperlipidemia associated with type 2 diabetes mellitus ([place])   Hyponatremia   Diabetic hyperosmolar non-ketotic state ([place])   Hyperglycemia   Cellulitis of great toe, right   1. Diabetic, hyperosmolar nonketotic state secondary to uncontrolled type 2 diabetes with poor medication compliance.  Patient's reported to be noncompliant with his medications at home.  A1c 13%.  CBGs rising; will adjust levemir and TID novolog meal coverage. Continue to follow CBG's trend.  2. Sepsis secondary to right foot diabetic ulcer and a strep bacteremia: Continue as needed antipyretics, continue IV fluids, follow  culture sensitivity.  Will continue IV penicillin G 4 times a day for another 24 hours. General surgery has been consulted and recommended no surgical debridement at this time.  Continue wound care and outpatient follow-up with podiatrist. still with elevated WBC's and low grade temp overnight.  3. 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  Renal function has improved with fluid resuscitation.  Will continue to minimize nephrotoxic agents.  Renal function continue improving. Continue to Follow trend. 4. Hyponatremia.  Likely secondary to element of dehydration as well as hyperglycemia.  This appears to be back to baseline.  Instructed to keep himself well-hydrated.  Continue to follow electrolytes intermittently. 5. Hypokalemia.  Improved and within normal limits now. 6. Hypertension-currently controlled.  Continue as needed labetalol and amlodipine. 7. Dyslipidemia.  Continue home statin. 8. Skin fungal infection: Patient started on nystatin powder twice daily; advised to keep area clean and dry.   DVT prophylaxis: Heparin Code Status: Full Family Communication: None at bedside Disposition Plan: Remains inpatient.  Low-grade temperature overnight still with elevated WBCs.  Repeat blood culture pending.  Continue IV antibiotics.  Continue wound care.  Continue adjusting  hypoglycemic regimen as needed..    Consultants:   General surgery  Procedures:   See below for x-ray reports.  Antimicrobials:   None  Subjective: No chest pain, no shortness of breath, no nausea, no vomiting.  Spike fever overnight.  Objective: Vitals:   08/21/18 0037 08/21/18 0615 08/21/18 0743 08/21/18 1414  BP: (!) 150/80 (!) 165/94 127/62 (!) 152/71  Pulse: (!) 103 (!) 104 91 (!)  109  Resp:  18  18  Temp: 99.2 F (37.3 C) (!) 100.9 F (38.3 C) 99.6 F (37.6 C) 98.6 F (37 C)  TempSrc: Oral Oral Oral   SpO2:  96%  97%  Weight:      Height:        Intake/Output Summary (Last 24 hours) at 08/21/2018  1503 Last data filed at 08/21/2018 1008 Gross per 24 hour  Intake 2658.25 ml  Output 600 ml  Net 2058.25 ml   Filed Weights   08/16/18 1502  Weight: 82.6 kg    Examination: General exam: Alert, awake, oriented x 3; spiked fever overnight.  No chest pain, no shortness of breath, no nausea, no vomiting. Respiratory system: Clear to auscultation. Respiratory effort normal. Cardiovascular system:RRR. No murmurs, rubs, gallops. Gastrointestinal system: Abdomen is nondistended, soft and nontender. No organomegaly or masses felt. Normal bowel sounds heard. Central nervous system: Alert and oriented. No focal neurological deficits. Extremities: No cyanosis or clubbing.  Right great toe callus, improved erythema, patient denies tenderness.  No significant drainage appreciated.  Dry dressing in place. Skin: Erythematous changes consistent with fungal infection affecting groins and scrotum. Psychiatry: Judgement and insight appear normal. Mood & affect appropriate.   Data Reviewed: I have personally reviewed following labs and imaging studies  CBC: Recent Labs  Lab 08/16/18 1135 08/17/18 0427 08/17/18 2149 08/18/18 0548 08/19/18 1128 08/21/18 0601  WBC 13.8* 10.7* 16.5* 19.1* 21.6* 21.1*  NEUTROABS 9.6* 9.1* 13.0* 15.2*  --   --   HGB 14.1 11.9* 12.0* 12.5* 12.1* 10.5*  HCT 43.0 35.1* 36.7* 38.7* 37.5* 32.4*  MCV 78.3* 75.3* 77.9* 77.7* 78.3* 78.3*  PLT 144* 130* 175 181 238 426   Basic Metabolic Panel: Recent Labs  Lab 08/16/18 2328 08/17/18 0427 08/17/18 2149 08/18/18 0548 08/19/18 0554 08/21/18 0601  NA 122* 126* 128* 131* 130* 129*  K 3.2* 3.0* 3.4* 3.6 4.0 4.2  CL 90* 94* 97* 101 102 100  CO2 22 24 23 22  19* 22  GLUCOSE 310* 188* 215* 212* 173* 233*  BUN 63* 63* 56* 45* 38* 30*  CREATININE 1.97* 2.34* 2.13* 1.71* 1.63* 1.56*  CALCIUM 7.3* 7.2* 7.3* 7.4* 7.2* 7.1*  MG 3.1*  --   --   --   --   --   PHOS 3.5  --   --   --   --   --    GFR: Estimated Creatinine  Clearance: 57.2 mL/min (A) (by C-G formula based on SCr of 1.56 mg/dL (H)).   Liver Function Tests: Recent Labs  Lab 08/16/18 1135 08/17/18 0427 08/17/18 2149 08/18/18 0548  AST 44* 40 89* 71*  ALT 35 26 45* 41  ALKPHOS 160* 126 171* 178*  BILITOT 1.9* 1.0 1.2 1.4*  PROT 6.1* 4.7* 4.9* 5.4*  ALBUMIN 2.3* 1.7* 1.6* 1.8*   Coagulation Profile: Recent Labs  Lab 08/17/18 2149  INR 1.01   CBG: Recent Labs  Lab 08/20/18 0738 08/20/18 1126 08/20/18 1629 08/21/18 0744 08/21/18 1142  GLUCAP 279* 205* 114* 235* 233*   Sepsis Labs: Recent Labs  Lab 08/17/18 2149 08/18/18 0006  PROCALCITON 28.84  --   LATICACIDVEN 1.6 1.6    Recent Results (from the past 240 hour(s))  Culture, blood (routine x 2)     Status: Abnormal   Collection Time: 08/17/18 10:55 AM  Result Value Ref Range Status   Specimen Description BLOOD RIGHT ANTECUBITAL  Final   Special Requests   Final    BOTTLES  DRAWN AEROBIC AND ANAEROBIC Blood Culture adequate volume   Culture  Setup Time   Final    IN BOTH AEROBIC AND ANAEROBIC BOTTLES GRAM POSITIVE COCCI IN CHAINS Gram Stain Report Called to,Read Back By and Verified With: PJ SEXTON,RN @0239  08/18/18 MKELLY Organism ID to follow    Culture GROUP B STREP(S.AGALACTIAE)ISOLATED (A)  Final   Report Status 08/20/2018 FINAL  Final   Organism ID, Bacteria GROUP B STREP(S.AGALACTIAE)ISOLATED  Final      Susceptibility   Group b strep(s.agalactiae)isolated - MIC*    CLINDAMYCIN <=0.25 SENSITIVE Sensitive     AMPICILLIN <=0.25 SENSITIVE Sensitive     ERYTHROMYCIN <=0.12 SENSITIVE Sensitive     VANCOMYCIN 0.5 SENSITIVE Sensitive     CEFTRIAXONE <=0.12 SENSITIVE Sensitive     LEVOFLOXACIN 4 INTERMEDIATE Intermediate     PENICILLIN Value in next row Sensitive      SENSITIVE<=0.12Performed at [facility], [place] [address], [place], [place] [address]    * GROUP B STREP(S.AGALACTIAE)ISOLATED  Blood Culture ID Panel (Reflexed)     Status: Abnormal    Collection Time: 08/17/18 10:55 AM  Result Value Ref Range Status   Enterococcus species NOT DETECTED NOT DETECTED Final   Listeria monocytogenes NOT DETECTED NOT DETECTED Final   Staphylococcus species NOT DETECTED NOT DETECTED Final   Staphylococcus aureus (BCID) NOT DETECTED NOT DETECTED Final   Streptococcus species DETECTED (A) NOT DETECTED Final    Comment: CRITICAL RESULT CALLED TO, READ BACK BY AND VERIFIED WITH: [place] Gleason PharmD 9:45 08/18/18 ([email or web address])    Streptococcus agalactiae DETECTED (A) NOT DETECTED Final    Comment: CRITICAL RESULT CALLED TO, READ BACK BY AND VERIFIED WITH: [place] Gleason PharmD 9:45 08/18/18 ([email or web address])    Streptococcus pneumoniae NOT DETECTED NOT DETECTED Final   Streptococcus pyogenes NOT DETECTED NOT DETECTED Final   Acinetobacter baumannii NOT DETECTED NOT DETECTED Final   Enterobacteriaceae species NOT DETECTED NOT DETECTED Final   Enterobacter cloacae complex NOT DETECTED NOT DETECTED Final   Escherichia coli NOT DETECTED NOT DETECTED Final   Klebsiella oxytoca NOT DETECTED NOT DETECTED Final   Klebsiella pneumoniae NOT DETECTED NOT DETECTED Final   Proteus species NOT DETECTED NOT DETECTED Final   Serratia marcescens NOT DETECTED NOT DETECTED Final   Haemophilus influenzae NOT DETECTED NOT DETECTED Final   Neisseria meningitidis NOT DETECTED NOT DETECTED Final   Pseudomonas aeruginosa NOT DETECTED NOT DETECTED Final   Candida albicans NOT DETECTED NOT DETECTED Final   Candida glabrata NOT DETECTED NOT DETECTED Final   Candida krusei NOT DETECTED NOT DETECTED Final   Candida parapsilosis NOT DETECTED NOT DETECTED Final   Candida tropicalis NOT DETECTED NOT DETECTED Final    Comment: Performed at [facility], [place]. [address], [place], [place] [address]  Culture, blood (routine x 2)     Status: Abnormal   Collection Time: 08/17/18 11:04 AM  Result Value Ref Range Status   Specimen Description   Final    BLOOD LEFT HAND Performed at [facility], [address], [place], [place] [address]    Special Requests   Final    BOTTLES DRAWN AEROBIC ONLY Blood Culture results may not be optimal due to an inadequate volume of blood received in culture bottles Performed at [facility], [address], [place], [place] [address]    Culture  Setup Time   Final    AEROBIC BOTTLE ONLY GRAM POSITIVE COCCI IN CHAINS Gram Stain Report Called to,Read Back By and Verified  With: Valorie Roosevelt @0235  08/18/18 [facility] Performed at [facility], [address] [place], [place] [address]    Culture (A)  Final    GROUP B STREP(S.AGALACTIAE)ISOLATED SUSCEPTIBILITIES PERFORMED ON PREVIOUS CULTURE WITHIN THE LAST 5 DAYS. Performed at [facility], [place] [address] [place], [place] [address]    Report Status 08/20/2018 FINAL  Final  Culture, blood (Routine X 2) w Reflex to ID Panel     Status: None (Preliminary result)   Collection Time: 08/19/18  6:58 PM  Result Value Ref Range Status   Specimen Description BLOOD RIGHT HAND  Final   Special Requests   Final    BOTTLES DRAWN AEROBIC AND ANAEROBIC Blood Culture adequate volume   Culture   Final    NO GROWTH 2 DAYS Performed at [facility], [address], [place], [place] [address]    Report Status PENDING  Incomplete  Culture, blood (Routine X 2) w Reflex to ID Panel     Status: None (Preliminary result)   Collection Time: 08/19/18  7:07 PM  Result Value Ref Range Status   Specimen Description BLOOD LEFT HAND  Final   Special Requests   Final    BOTTLES DRAWN AEROBIC AND ANAEROBIC Blood Culture adequate volume   Culture   Final    NO GROWTH 2 DAYS Performed at [facility], [address], [place], [place] [address]    Report Status PENDING  Incomplete     Radiology Studies: No results found.  Scheduled Meds: . amLODipine  10 mg Oral Daily  . atorvastatin  10 mg Oral Daily  . docusate sodium  100 mg Oral BID  . feeding supplement (PRO-STAT SUGAR FREE 64)  30 mL Oral BID  . heparin   5,000 Units Subcutaneous Q8H  . insulin aspart  0-15 Units Subcutaneous TID WC  . insulin aspart  0-5 Units Subcutaneous QHS  . insulin aspart  8 Units Subcutaneous TID WC  . insulin detemir  30 Units Subcutaneous Daily  . nystatin   Topical BID  . polyethylene glycol  17 g Oral Daily   Continuous Infusions: . sodium chloride Stopped (08/18/18 0947)  . 0.9 % NaCl with KCl 20 mEq / L 75 mL/hr at 08/20/18 1500  . pencillin G potassium IV 4 Million Units (08/21/18 1358)     LOS: 4 days    Time spent: 30 minutes    Barton Dubois, MD [facility] Pager [phone]  If 7PM-7AM, please contact night-coverage [email or web address] Password [facility] 08/21/2018, 3:03 PM

## 2018-08-22 ENCOUNTER — Encounter (HOSPITAL_COMMUNITY): Payer: Self-pay

## 2018-08-22 ENCOUNTER — Inpatient Hospital Stay (HOSPITAL_COMMUNITY): Payer: Managed Care, Other (non HMO)

## 2018-08-22 LAB — CBC
HCT: 34.4 % — ABNORMAL LOW (ref 39.0–52.0)
Hemoglobin: 11.1 g/dL — ABNORMAL LOW (ref 13.0–17.0)
MCH: 25.6 pg — ABNORMAL LOW (ref 26.0–34.0)
MCHC: 32.3 g/dL (ref 30.0–36.0)
MCV: 79.4 fL — ABNORMAL LOW (ref 80.0–100.0)
Platelets: 386 10*3/uL (ref 150–400)
RBC: 4.33 MIL/uL (ref 4.22–5.81)
RDW: 14.3 % (ref 11.5–15.5)
WBC: 20.8 10*3/uL — ABNORMAL HIGH (ref 4.0–10.5)
nRBC: 0 % (ref 0.0–0.2)

## 2018-08-22 LAB — GLUCOSE, CAPILLARY
Glucose-Capillary: 198 mg/dL — ABNORMAL HIGH (ref 70–99)
Glucose-Capillary: 292 mg/dL — ABNORMAL HIGH (ref 70–99)
Glucose-Capillary: 203 mg/dL — ABNORMAL HIGH (ref 70–99)
Glucose-Capillary: 121 mg/dL — ABNORMAL HIGH (ref 70–99)

## 2018-08-22 LAB — BASIC METABOLIC PANEL
Anion gap: 8 (ref 5–15)
BUN: 28 mg/dL — ABNORMAL HIGH (ref 6–20)
CHLORIDE: 100 mmol/L (ref 98–111)
CO2: 21 mmol/L — ABNORMAL LOW (ref 22–32)
Calcium: 7.3 mg/dL — ABNORMAL LOW (ref 8.9–10.3)
Creatinine, Ser: 1.54 mg/dL — ABNORMAL HIGH (ref 0.61–1.24)
GFR calc Af Amer: 59 mL/min — ABNORMAL LOW (ref >60)
GFR calc non Af Amer: 51 mL/min — ABNORMAL LOW (ref >60)
Glucose, Bld: 168 mg/dL — ABNORMAL HIGH (ref 70–99)
Potassium: 4.4 mmol/L (ref 3.5–5.1)
SODIUM: 129 mmol/L — AB (ref 135–145)

## 2018-08-22 MED ORDER — IOPAMIDOL (ISOVUE-300) INJECTION 61%
65.0000 mL | Freq: Once | INTRAVENOUS | Status: AC | PRN
  Administered 2018-08-22: 65 mL via INTRAVENOUS

## 2018-08-22 NOTE — Telephone Encounter (Signed)
Form was filled out as best as I could please review then forward to family

## 2018-08-22 NOTE — Progress Notes (Signed)
PROGRESS NOTE    Randy Oneal  UEA:[id number] DOB: [birth year] DOA: 08/16/2018 PCP: [name], MD   Brief Narrative:  Per HPI: Randy Oneal is a 53 y.o. male with medical history significant of type 2 diabetes, hypertension, renal insufficiency who is coming to the emergency department referred by his PCP Dr. Wolfgang Oneal due to dehydration and uncontrolled diabetes.  He is unable to provide further history as he is only oriented to name.  He follows simple commands, but quickly forgets while he is doing.  I spoke to his wife Randy Oneal who stated the patient has been having polyuria, polydipsia for a while.  She also stated that he has some visual hallucinations over the past 2 days.  To her knowledge, he has not had head trauma, fever, chills, dyspnea, cough, chest pain, abdominal pain, vomiting or diarrhea.  No further history is available at this time.  Patient was admitted with diabetic, hyperosmolar nonketotic state with poor compliance on home insulin regimen.  He was also noted to have sepsis due to diabetic foot ulcer and bacteremia.  Assessment & Plan:   Principal Problem:   AKI (acute kidney injury) ([place]) Active Problems:   Type 2 diabetes mellitus not at goal [facility])   HTN (hypertension), benign   Personal history of noncompliance with medical treatment, presenting hazards to health   Hyperlipidemia associated with type 2 diabetes mellitus ([place])   Hyponatremia   Diabetic hyperosmolar non-ketotic state ([place])   Hyperglycemia   Cellulitis of great toe, right   1. Diabetic, hyperosmolar nonketotic state secondary to uncontrolled type 2 diabetes with poor medication compliance.  Patient's reported to be noncompliant with his medications at home.  A1c 13%.  CBGs rising; will adjust levemir and TID novolog meal coverage. Continue to follow CBG's trend.  2. Sepsis secondary to right foot diabetic ulcer and a strep bacteremia: Continue as needed antipyretics, continue IV fluids, follow  culture sensitivity.  Will continue IV penicillin G 4 times a day for another 24 hours. General surgery has been consulted and recommended no surgical debridement at this time.  Continue wound care and outpatient follow-up with podiatrist. still with elevated WBC's and low grade temp overnight.  3. 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  Renal function has improved with fluid resuscitation.  Will continue to minimize nephrotoxic agents.  Renal function continue improving. Continue to Follow trend. 4. Hyponatremia.  Likely secondary to element of dehydration as well as hyperglycemia.  This appears to be back to baseline.  Instructed to keep himself well-hydrated.  Continue to follow electrolytes intermittently. 5. Hypokalemia.  Improved and within normal limits now. 6. Hypertension-currently controlled.  Continue as needed labetalol and amlodipine. 7. Dyslipidemia.  Continue home statin. 8. Skin fungal infection: Patient started on nystatin powder twice daily; advised to keep area clean and dry. 9. Left upper chest induration/tenderness and erythema: CT scan of the chest has been ordered follow recommendations by general surgery.  Continue IV antibiotics.  Follow clinical response.  Patient instructed to use antipyretics as needed for fever.   DVT prophylaxis: Heparin Code Status: Full Family Communication: None at bedside Disposition Plan: Remains inpatient.  Spiked temperature overnight.  Repeat blood culture pending has remained without growth.  Continue IV antibiotics.  Continue wound care.  Continue adjusting  hypoglycemic regimen as needed.  Will check CT scanning of his chest and 2D echo.    Consultants:   General surgery  Procedures:   See below for x-ray reports.  2-D  echo  Antimicrobials:   None  Subjective: No chest pain, no shortness of breath.  Patient expressed no nausea, abdominal pain or any vomiting.  Left upper aspect of his chest with tenderness on palpation and  induration.  New finding.  Continues spiking fever.  Objective: Vitals:   08/22/18 0410 08/22/18 0639 08/22/18 0840 08/22/18 1329  BP:  (!) 167/80  (!) 177/81  Pulse:  (!) 115  (!) 112  Resp:  18  18  Temp: 99 F (37.2 C) (!) 101.1 F (38.4 C) 99.6 F (37.6 C) (!) 101.2 F (38.4 C)  TempSrc: Oral Oral Oral Oral  SpO2:  94%  98%  Weight:      Height:        Intake/Output Summary (Last 24 hours) at 08/22/2018 1558 Last data filed at 08/22/2018 1515 Gross per 24 hour  Intake 5195.8 ml  Output 400 ml  Net 4795.8 ml   Filed Weights   08/16/18 1502  Weight: 82.6 kg    Examination: General exam: Alert, awake, oriented x 3; patient once again spiked fever overnight.  Denies chest pain, no shortness of breath, no nausea, no vomiting. Respiratory system: Clear to auscultation. Respiratory effort normal. Cardiovascular system: Sinus tachycardia. No murmurs, rubs, gallops. Gastrointestinal system: Abdomen is nondistended, soft and nontender. No organomegaly or masses felt. Normal bowel sounds heard. Central nervous system: Alert and oriented. No focal neurological deficits. Extremities: No no cyanosis or clubbing.  Right great toe callus with improved erythema, no tenderness on palpation and no significant drainage appreciated on dressing. Skin: The base of his neck on the left upper chest was indurated on palpation, right tender with poor sensation.  According to patient acute finding. Psychiatry: Judgement and insight appear normal. Mood & affect appropriate.   Data Reviewed: I have personally reviewed following labs and imaging studies  CBC: Recent Labs  Lab 08/16/18 1135 08/17/18 0427 08/17/18 2149 08/18/18 0548 08/19/18 1128 08/21/18 0601 08/22/18 0613  WBC 13.8* 10.7* 16.5* 19.1* 21.6* 21.1* 20.8*  NEUTROABS 9.6* 9.1* 13.0* 15.2*  --   --   --   HGB 14.1 11.9* 12.0* 12.5* 12.1* 10.5* 11.1*  HCT 43.0 35.1* 36.7* 38.7* 37.5* 32.4* 34.4*  MCV 78.3* 75.3* 77.9* 77.7*  78.3* 78.3* 79.4*  PLT 144* 130* 175 181 238 303 423   Basic Metabolic Panel: Recent Labs  Lab 08/16/18 2328  08/17/18 2149 08/18/18 0548 08/19/18 0554 08/21/18 0601 08/22/18 0613  NA 122*   < > 128* 131* 130* 129* 129*  K 3.2*   < > 3.4* 3.6 4.0 4.2 4.4  CL 90*   < > 97* 101 102 100 100  CO2 22   < > 23 22 19* 22 21*  GLUCOSE 310*   < > 215* 212* 173* 233* 168*  BUN 63*   < > 56* 45* 38* 30* 28*  CREATININE 1.97*   < > 2.13* 1.71* 1.63* 1.56* 1.54*  CALCIUM 7.3*   < > 7.3* 7.4* 7.2* 7.1* 7.3*  MG 3.1*  --   --   --   --   --   --   PHOS 3.5  --   --   --   --   --   --    < > = values in this interval not displayed.   GFR: Estimated Creatinine Clearance: 57.9 mL/min (A) (by C-G formula based on SCr of 1.54 mg/dL (H)).   Liver Function Tests: Recent Labs  Lab 08/16/18 1135 08/17/18 0427  08/17/18 2149 08/18/18 0548  AST 44* 40 89* 71*  ALT 35 26 45* 41  ALKPHOS 160* 126 171* 178*  BILITOT 1.9* 1.0 1.2 1.4*  PROT 6.1* 4.7* 4.9* 5.4*  ALBUMIN 2.3* 1.7* 1.6* 1.8*   Coagulation Profile: Recent Labs  Lab 08/17/18 2149  INR 1.01   CBG: Recent Labs  Lab 08/21/18 1142 08/21/18 1656 08/21/18 2056 08/22/18 0749 08/22/18 1108  GLUCAP 233* 233* 125* 198* 292*   Sepsis Labs: Recent Labs  Lab 08/17/18 2149 08/18/18 0006  PROCALCITON 28.84  --   LATICACIDVEN 1.6 1.6    Recent Results (from the past 240 hour(s))  Culture, blood (routine x 2)     Status: Abnormal   Collection Time: 08/17/18 10:55 AM  Result Value Ref Range Status   Specimen Description BLOOD RIGHT ANTECUBITAL  Final   Special Requests   Final    BOTTLES DRAWN AEROBIC AND ANAEROBIC Blood Culture adequate volume   Culture  Setup Time   Final    IN BOTH AEROBIC AND ANAEROBIC BOTTLES GRAM POSITIVE COCCI IN CHAINS Gram Stain Report Called to,Read Back By and Verified With: PJ SEXTON,RN @0239  08/18/18 MKELLY Organism ID to follow    Culture GROUP B STREP(S.AGALACTIAE)ISOLATED (A)  Final    Report Status 08/20/2018 FINAL  Final   Organism ID, Bacteria GROUP B STREP(S.AGALACTIAE)ISOLATED  Final      Susceptibility   Group b strep(s.agalactiae)isolated - MIC*    CLINDAMYCIN <=0.25 SENSITIVE Sensitive     AMPICILLIN <=0.25 SENSITIVE Sensitive     ERYTHROMYCIN <=0.12 SENSITIVE Sensitive     VANCOMYCIN 0.5 SENSITIVE Sensitive     CEFTRIAXONE <=0.12 SENSITIVE Sensitive     LEVOFLOXACIN 4 INTERMEDIATE Intermediate     PENICILLIN Value in next row Sensitive      SENSITIVE<=0.12Performed at [facility], [place] [address] [place], [place] [address]    * GROUP B STREP(S.AGALACTIAE)ISOLATED  Blood Culture ID Panel (Reflexed)     Status: Abnormal   Collection Time: 08/17/18 10:55 AM  Result Value Ref Range Status   Enterococcus species NOT DETECTED NOT DETECTED Final   Listeria monocytogenes NOT DETECTED NOT DETECTED Final   Staphylococcus species NOT DETECTED NOT DETECTED Final   Staphylococcus aureus (BCID) NOT DETECTED NOT DETECTED Final   Streptococcus species DETECTED (A) NOT DETECTED Final    Comment: CRITICAL RESULT CALLED TO, READ BACK BY AND VERIFIED WITH: Forest Gleason PharmD 9:45 08/18/18 ([email or web address])    Streptococcus agalactiae DETECTED (A) NOT DETECTED Final    Comment: CRITICAL RESULT CALLED TO, READ BACK BY AND VERIFIED WITH: Forest Gleason PharmD 9:45 08/18/18 ([email or web address])    Streptococcus pneumoniae NOT DETECTED NOT DETECTED Final   Streptococcus pyogenes NOT DETECTED NOT DETECTED Final   Acinetobacter baumannii NOT DETECTED NOT DETECTED Final   Enterobacteriaceae species NOT DETECTED NOT DETECTED Final   Enterobacter cloacae complex NOT DETECTED NOT DETECTED Final   Escherichia coli NOT DETECTED NOT DETECTED Final   Klebsiella oxytoca NOT DETECTED NOT DETECTED Final   Klebsiella pneumoniae NOT DETECTED NOT DETECTED Final   Proteus species NOT DETECTED NOT DETECTED Final   Serratia marcescens NOT DETECTED NOT DETECTED Final   Haemophilus influenzae NOT DETECTED NOT DETECTED  Final   Neisseria meningitidis NOT DETECTED NOT DETECTED Final   Pseudomonas aeruginosa NOT DETECTED NOT DETECTED Final   Candida albicans NOT DETECTED NOT DETECTED Final   Candida glabrata NOT DETECTED NOT DETECTED Final   Candida krusei NOT DETECTED NOT DETECTED Final   Candida parapsilosis  NOT DETECTED NOT DETECTED Final   Candida tropicalis NOT DETECTED NOT DETECTED Final    Comment: Performed at [facility], [place] [address] [place], [place] [address]  Culture, blood (routine x 2)     Status: Abnormal   Collection Time: 08/17/18 11:04 AM  Result Value Ref Range Status   Specimen Description   Final    BLOOD LEFT HAND Performed at [facility], [address] [place], [place] [address]    Special Requests   Final    BOTTLES DRAWN AEROBIC ONLY Blood Culture results may not be optimal due to an inadequate volume of blood received in culture bottles Performed at [facility], [address] [place], [place] [address]    Culture  Setup Time   Final    AEROBIC BOTTLE ONLY GRAM POSITIVE COCCI IN CHAINS Gram Stain Report Called to,Read Back By and Verified With: PJ SEXTON,RN @0235  08/18/18 [facility] Performed at [facility], [address], [place], [place] [address]    Culture (A)  Final    GROUP B STREP(S.AGALACTIAE)ISOLATED SUSCEPTIBILITIES PERFORMED ON PREVIOUS CULTURE WITHIN THE LAST 5 DAYS. Performed at [facility], [place] [address] [place], [place] [address]    Report Status 08/20/2018 FINAL  Final  Culture, blood (Routine X 2) w Reflex to ID Panel     Status: None (Preliminary result)   Collection Time: 08/19/18  6:58 PM  Result Value Ref Range Status   Specimen Description BLOOD RIGHT HAND  Final   Special Requests   Final    BOTTLES DRAWN AEROBIC AND ANAEROBIC Blood Culture adequate volume   Culture   Final    NO GROWTH 3 DAYS Performed at [facility], [address], [place], [place] [address]    Report Status PENDING  Incomplete  Culture, blood (Routine X  2) w Reflex to ID Panel     Status: None (Preliminary result)   Collection Time: 08/19/18  7:07 PM  Result Value Ref Range Status   Specimen Description BLOOD LEFT HAND  Final   Special Requests   Final    BOTTLES DRAWN AEROBIC AND ANAEROBIC Blood Culture adequate volume   Culture   Final    NO GROWTH 3 DAYS Performed at [facility], [address] [place], [place] [address]    Report Status PENDING  Incomplete     Radiology Studies: No results found.  Scheduled Meds: . amLODipine  10 mg Oral Daily  . atorvastatin  10 mg Oral Daily  . docusate sodium  100 mg Oral BID  . feeding supplement (PRO-STAT SUGAR FREE 64)  30 mL Oral BID  . heparin  5,000 Units Subcutaneous Q8H  . insulin aspart  0-15 Units Subcutaneous TID WC  . insulin aspart  0-5 Units Subcutaneous QHS  . insulin aspart  8 Units Subcutaneous TID WC  . insulin detemir  30 Units Subcutaneous Daily  . nystatin   Topical BID  . polyethylene glycol  17 g Oral Daily   Continuous Infusions: . sodium chloride Stopped (08/18/18 0947)  . 0.9 % NaCl with KCl 20 mEq / L 100 mL/hr at 08/22/18 9629  . pencillin G potassium IV 4 Million Units (08/22/18 1411)     LOS: 5 days    Time spent: 30 minutes    Barton Dubois, MD [facility] Pager [phone]  If 7PM-7AM, please contact night-coverage [email or web address] Password [facility] 08/22/2018, 3:58 PM

## 2018-08-23 ENCOUNTER — Inpatient Hospital Stay (HOSPITAL_COMMUNITY): Payer: Managed Care, Other (non HMO)

## 2018-08-23 LAB — BASIC METABOLIC PANEL
Anion gap: 7 (ref 5–15)
BUN: 26 mg/dL — AB (ref 6–20)
CO2: 20 mmol/L — ABNORMAL LOW (ref 22–32)
Calcium: 7.3 mg/dL — ABNORMAL LOW (ref 8.9–10.3)
Chloride: 100 mmol/L (ref 98–111)
Creatinine, Ser: 1.54 mg/dL — ABNORMAL HIGH (ref 0.61–1.24)
GFR calc Af Amer: 59 mL/min — ABNORMAL LOW (ref >60)
GFR calc non Af Amer: 51 mL/min — ABNORMAL LOW (ref >60)
Glucose, Bld: 283 mg/dL — ABNORMAL HIGH (ref 70–99)
Potassium: 5 mmol/L (ref 3.5–5.1)
Sodium: 127 mmol/L — ABNORMAL LOW (ref 135–145)

## 2018-08-23 LAB — GLUCOSE, CAPILLARY
Glucose-Capillary: 88 mg/dL (ref 70–99)
Glucose-Capillary: 147 mg/dL — ABNORMAL HIGH (ref 70–99)
Glucose-Capillary: 270 mg/dL — ABNORMAL HIGH (ref 70–99)
Glucose-Capillary: 225 mg/dL — ABNORMAL HIGH (ref 70–99)
Glucose-Capillary: 97 mg/dL (ref 70–99)

## 2018-08-23 MED ORDER — SODIUM CHLORIDE 0.9 % IV SOLN
3.0000 g | Freq: Four times a day (QID) | INTRAVENOUS | Status: DC
  Administered 2018-08-23 – 2018-08-27 (×15): 3 g via INTRAVENOUS
  Filled 2018-08-23 (×20): qty 3

## 2018-08-23 MED ORDER — IOHEXOL 300 MG/ML  SOLN
75.0000 mL | Freq: Once | INTRAMUSCULAR | Status: AC | PRN
  Administered 2018-08-23: 75 mL via INTRAVENOUS

## 2018-08-23 NOTE — Progress Notes (Signed)
Inpatient Diabetes Program Recommendations  AACE/ADA: New Consensus Statement on Inpatient Glycemic Control (2015)  Target Ranges:  Prepandial:   less than 140 mg/dL      Peak postprandial:   less than 180 mg/dL (1-2 hours)      Critically ill patients:  140 - 180 mg/dL   Lab Results  Component Value Date   GLUCAP 147 (H) 08/23/2018   HGBA1C 13.0 (A) 08/16/2018    Review of Glycemic Control  CBGs past 24H 121- 292 mg/dL. Post prandials elevated.  May benefit from increasing meal coverage insulin to Novolog 10 units tidwc.  Will continue to follow.  Thank you. Lorenda Peck, RD, LDN, CDE Inpatient Diabetes Coordinator [phone]

## 2018-08-23 NOTE — Progress Notes (Signed)
PROGRESS NOTE    Randy Randy Oneal  MWU:[id number] DOB: Mar 19, 1966 DOA: 08/16/2018 PCP: Randy Drown, MD   Brief Narrative:  Per HPI: Randy Randy Oneal is a 53 y.o. male with medical history significant of type 2 diabetes, hypertension, renal insufficiency who is coming to the emergency department referred by his PCP Dr. Wolfgang Randy Oneal due to dehydration and uncontrolled diabetes.  He is unable to provide further history as he is only oriented to name.  He follows simple commands, but quickly forgets while he is doing.  I spoke to his wife Randy Randy Oneal who stated the patient has been having polyuria, polydipsia for a while.  She also stated that he has some visual hallucinations over the past 2 days.  To her knowledge, he has not had head trauma, fever, chills, dyspnea, cough, chest pain, abdominal pain, vomiting or diarrhea.  No further history is available at this time.  Patient was admitted with diabetic, hyperosmolar nonketotic state with poor compliance on home insulin regimen.  He was also noted to have sepsis due to diabetic foot ulcer and bacteremia.  Assessment & Plan:   Principal Problem:   AKI (acute kidney injury) ([place]) Active Problems:   Type 2 diabetes mellitus not at goal [facility])   HTN (hypertension), benign   Personal history of noncompliance with medical treatment, presenting hazards to health   Hyperlipidemia associated with type 2 diabetes mellitus ([place])   Hyponatremia   Diabetic hyperosmolar non-ketotic state ([place])   Hyperglycemia   Cellulitis of great toe, right   1. Diabetic, hyperosmolar nonketotic state secondary to uncontrolled type 2 diabetes with poor medication compliance.  Patient's reported to be noncompliant with his medications at home.  A1c 13%.  Continue Levemir and 3 times daily NovoLog meal coverage along with a sliding scale insulin.  Follow CBGs fluctuation and further adjust hypoglycemic regimen as required.  Infection most likely responsible for his acute abnormal  sugar levels. 2. Sepsis secondary to right foot diabetic ulcer, left-sided neck and upper chest abscess; and strep B bacteremia: Continue as needed antipyretics, continue IV fluids, follow culture sensitivity.  Case discussed with ID service and following findings on the CT neck demonstrating gas leak, antibiotic has been broadened to Unasyn.  Patient continues spiking fever.  [facility]. 3. AKI with baseline CKD stage Randy Oneal.  Renal function has improved with fluid resuscitation.  Will continue to minimize nephrotoxic agents.  Follow renal function trend after contrast needed for CT scans evaluating neck abscess.  Will continue IV fluids.   4. Hyponatremia.  Likely secondary to element of dehydration as well as hyperglycemia.  This appears to be back to baseline (when calculating corrected sodium he is 131).  Patient has been instructed to keep himself well-hydrated orally.  Continue to follow electrolytes intermittently.  Continue IV fluids. 5. Hypokalemia.  Repleted and within normal limits at this time. 6. Hypertension-currently controlled.  Continue current antihypertensive regimen (using amlodipine and as needed labetalol). 7. Dyslipidemia.  Continue statins. 8. Skin fungal infection: Patient started on nystatin powder twice daily; advised to keep area clean and dry.  There is improvement in the redness that was affecting his groin and scrotum skin. 9. Left upper chest and left-sided neck induration/tenderness and erythema: CT scan findings demonstrating extensive abscess affecting deep tissue and extending to superior mediastinum.  Case discussed with cardiothoracic surgeons who recommended involvement of ENT for surgical debridement and further treatment.  After reviewing images they feel that  this may be associated with retropharyngeal infection/abscess.  Case was discussed with ID and given the presentation of The Images His Antibiotics Has  Been Broadened to Unasyn.  Will recommend formal ID consultation once the patient gets to [facility].  Dr. Benjamine Oneal; will see him in consultation.     DVT prophylaxis: Heparin Code Status: Full Family Communication: None at bedside Disposition Plan: Remains inpatient.  Patient has continued spiking fever.  Repeat blood culture has remained without any growth.  Further work-up has demonstrated extensive abscess affecting deep tissues in his neck and extending to the superior mediastinum; there is positive mild gas appreciated on these images.  Consultants:   General surgery  ENT  ID (Dr. Tommy Randy Oneal).  Procedures:   See below for x-ray reports.  2-D echo: Preserved ejection fraction, no wall motion abnormalities, no vegetations appreciated on this study.  Antimicrobials:   None  Subjective: No chest pain, no shortness of breath, no nausea, no vomiting.  Swallowing without difficulties and reporting improvement in appetite.  Continued to spike fever and is complaining of left sided neck and upper chest discomfort.  Objective: Vitals:   08/23/18 0208 08/23/18 0434 08/23/18 0926 08/23/18 1428  BP:  (!) 175/92 (!) 161/78 (!) 162/83  Pulse:  (!) 111  (!) 108  Resp:  18  20  Temp: 100 F (37.8 C) 100 F (37.8 C)  99.8 F (37.7 C)  TempSrc: Oral Oral  Oral  SpO2:  99%  98%  Weight:      Height:        Intake/Output Summary (Last 24 hours) at 08/23/2018 1736 Last data filed at 08/23/2018 1534 Gross per 24 hour  Intake 4188.15 ml  Output -  Net 4188.15 ml   Filed Weights   08/16/18 1502  Weight: 82.6 kg    Examination: General exam: Alert, awake, oriented x 3; continues spiking fever, no nausea, no vomiting, no shortness of breath.  Patient expressed some pain on the left aspect of his neck and upper chest. Respiratory system: Clear to auscultation. Respiratory effort normal. Cardiovascular system: Sinus tachycardia appreciated. No murmurs, rubs,  gallops. Gastrointestinal system: Abdomen is nondistended, soft and nontender. No organomegaly or masses felt. Normal bowel sounds heard. Central nervous system: Alert and oriented. No focal neurological deficits. Extremities: No cyanosis or clubbing.  Right great toe callus essentially with resolved erythematous changes and no significant drainage.  Patient reported no pain Skin: Erythema, swelling and tenderness to palpation on the left base aspect of his neck and upper chest; induration failed on exam.  No crepitation.  Improved erythematous changes in his groin and scrotum. Psychiatry: Judgement and insight appear normal. Mood & affect appropriate.   Data Reviewed: I have personally reviewed following labs and imaging studies  CBC: Recent Labs  Lab 08/17/18 0427 08/17/18 2149 08/18/18 0548 08/19/18 1128 08/21/18 0601 08/22/18 0613  WBC 10.7* 16.5* 19.1* 21.6* 21.1* 20.8*  NEUTROABS 9.1* 13.0* 15.2*  --   --   --   HGB 11.9* 12.0* 12.5* 12.1* 10.5* 11.1*  HCT 35.1* 36.7* 38.7* 37.5* 32.4* 34.4*  MCV 75.3* 77.9* 77.7* 78.3* 78.3* 79.4*  PLT 130* 175 181 238 303 481   Basic Metabolic Panel: Recent Labs  Lab 08/16/18 2328  08/18/18 0548 08/19/18 0554 08/21/18 0601 08/22/18 0613 08/23/18 1010  NA 122*   < > 131* 130* 129* 129* 127*  K 3.2*   < > 3.6 4.0 4.2 4.4 5.0  CL 90*   < >  101 102 100 100 100  CO2 22   < > 22 19* 22 21* 20*  GLUCOSE 310*   < > 212* 173* 233* 168* 283*  BUN 63*   < > 45* 38* 30* 28* 26*  CREATININE 1.97*   < > 1.71* 1.63* 1.56* 1.54* 1.54*  CALCIUM 7.3*   < > 7.4* 7.2* 7.1* 7.3* 7.3*  MG 3.1*  --   --   --   --   --   --   PHOS 3.5  --   --   --   --   --   --    < > = values in this interval not displayed.   GFR: Estimated Creatinine Clearance: 57.9 mL/min (A) (by C-G formula based on SCr of 1.54 mg/dL (H)).   Liver Function Tests: Recent Labs  Lab 08/17/18 0427 08/17/18 2149 08/18/18 0548  AST 40 89* 71*  ALT 26 45* 41  ALKPHOS 126 171*  178*  BILITOT 1.0 1.2 1.4*  PROT 4.7* 4.9* 5.4*  ALBUMIN 1.7* 1.6* 1.8*   Coagulation Profile: Recent Labs  Lab 08/17/18 2149  INR 1.01   CBG: Recent Labs  Lab 08/22/18 1608 08/22/18 2044 08/23/18 0747 08/23/18 1117 08/23/18 1616  GLUCAP 203* 121* 147* 270* 225*   Sepsis Labs: Recent Labs  Lab 08/17/18 2149 08/18/18 0006  PROCALCITON 28.84  --   LATICACIDVEN 1.6 1.6    Recent Results (from the past 240 hour(s))  Culture, blood (routine x 2)     Status: Abnormal   Collection Time: 08/17/18 10:55 AM  Result Value Ref Range Status   Specimen Description BLOOD RIGHT ANTECUBITAL  Final   Special Requests   Final    BOTTLES DRAWN AEROBIC AND ANAEROBIC Blood Culture adequate volume   Culture  Setup Time   Final    IN BOTH AEROBIC AND ANAEROBIC BOTTLES GRAM POSITIVE COCCI IN CHAINS Gram Stain Report Called to,Read Back By and Verified With: PJ SEXTON,RN @0239  08/18/18 MKELLY Organism ID to follow    Culture GROUP B STREP(S.AGALACTIAE)ISOLATED (A)  Final   Report Status 08/20/2018 FINAL  Final   Organism ID, Bacteria GROUP B STREP(S.AGALACTIAE)ISOLATED  Final      Susceptibility   Group b strep(s.agalactiae)isolated - MIC*    CLINDAMYCIN <=0.25 SENSITIVE Sensitive     AMPICILLIN <=0.25 SENSITIVE Sensitive     ERYTHROMYCIN <=0.12 SENSITIVE Sensitive     VANCOMYCIN 0.5 SENSITIVE Sensitive     CEFTRIAXONE <=0.12 SENSITIVE Sensitive     LEVOFLOXACIN 4 INTERMEDIATE Intermediate     PENICILLIN Value in next row Sensitive      SENSITIVE<=0.12Performed at [facility], [place] [address] [place], [place] [address]    * GROUP B STREP(S.AGALACTIAE)ISOLATED  Blood Culture ID Panel (Reflexed)     Status: Abnormal   Collection Time: 08/17/18 10:55 AM  Result Value Ref Range Status   Enterococcus species NOT DETECTED NOT DETECTED Final   Listeria monocytogenes NOT DETECTED NOT DETECTED Final   Staphylococcus species NOT DETECTED NOT DETECTED Final   Staphylococcus aureus  (BCID) NOT DETECTED NOT DETECTED Final   Streptococcus species DETECTED (A) NOT DETECTED Final    Comment: CRITICAL RESULT CALLED TO, READ BACK BY AND VERIFIED WITH: Forest Gleason PharmD 9:45 08/18/18 ([email or web address])    Streptococcus agalactiae DETECTED (A) NOT DETECTED Final    Comment: CRITICAL RESULT CALLED TO, READ BACK BY AND VERIFIED WITH: Forest Gleason PharmD 9:45 08/18/18 ([email or web address])    Streptococcus pneumoniae NOT DETECTED NOT  DETECTED Final   Streptococcus pyogenes NOT DETECTED NOT DETECTED Final   Acinetobacter baumannii NOT DETECTED NOT DETECTED Final   Enterobacteriaceae species NOT DETECTED NOT DETECTED Final   Enterobacter cloacae complex NOT DETECTED NOT DETECTED Final   Escherichia coli NOT DETECTED NOT DETECTED Final   Klebsiella oxytoca NOT DETECTED NOT DETECTED Final   Klebsiella pneumoniae NOT DETECTED NOT DETECTED Final   Proteus species NOT DETECTED NOT DETECTED Final   Serratia marcescens NOT DETECTED NOT DETECTED Final   Haemophilus influenzae NOT DETECTED NOT DETECTED Final   Neisseria meningitidis NOT DETECTED NOT DETECTED Final   Pseudomonas aeruginosa NOT DETECTED NOT DETECTED Final   Candida albicans NOT DETECTED NOT DETECTED Final   Candida glabrata NOT DETECTED NOT DETECTED Final   Candida krusei NOT DETECTED NOT DETECTED Final   Candida parapsilosis NOT DETECTED NOT DETECTED Final   Candida tropicalis NOT DETECTED NOT DETECTED Final    Comment: Performed at [facility], [place] [address], [place], [place] [address]  Culture, blood (routine x 2)     Status: Abnormal   Collection Time: 08/17/18 11:04 AM  Result Value Ref Range Status   Specimen Description   Final    BLOOD LEFT HAND Performed at [facility], [address], [place], [place] [address]    Special Requests   Final    BOTTLES DRAWN AEROBIC ONLY Blood Culture results may not be optimal due to an inadequate volume of blood received in culture bottles Performed at [facility], [address]  [place], [place] [address]    Culture  Setup Time   Final    AEROBIC BOTTLE ONLY GRAM POSITIVE COCCI IN CHAINS Gram Stain Report Called to,Read Back By and Verified With: PJ SEXTON,RN @0235  08/18/18 [facility] Performed at [facility], [address], [place], [place] [address]    Culture (A)  Final    GROUP B STREP(S.AGALACTIAE)ISOLATED SUSCEPTIBILITIES PERFORMED ON PREVIOUS CULTURE WITHIN THE LAST 5 DAYS. Performed at [facility], [place] [address], [place], [place] [address]    Report Status 08/20/2018 FINAL  Final  Culture, blood (Routine X 2) w Reflex to ID Panel     Status: None (Preliminary result)   Collection Time: 08/19/18  6:58 PM  Result Value Ref Range Status   Specimen Description BLOOD RIGHT HAND  Final   Special Requests   Final    BOTTLES DRAWN AEROBIC AND ANAEROBIC Blood Culture adequate volume   Culture   Final    NO GROWTH 4 DAYS Performed at [facility], [address] [place], [place] [address]    Report Status PENDING  Incomplete  Culture, blood (Routine X 2) w Reflex to ID Panel     Status: None (Preliminary result)   Collection Time: 08/19/18  7:07 PM  Result Value Ref Range Status   Specimen Description BLOOD LEFT HAND  Final   Special Requests   Final    BOTTLES DRAWN AEROBIC AND ANAEROBIC Blood Culture adequate volume   Culture   Final    NO GROWTH 4 DAYS Performed at [facility], [address] [place], [place] [address]    Report Status PENDING  Incomplete     Radiology Studies: Ct Soft Tissue Neck W Contrast  Result Date: 08/23/2018 CLINICAL DATA:  Fullness of the left neck and supraclavicular region. Symptoms began several days ago. Abnormal chest CT. EXAM: CT NECK WITH CONTRAST TECHNIQUE: Multidetector CT imaging of the neck was performed using the standard protocol following the  bolus administration of intravenous contrast. CONTRAST:  49mL OMNIPAQUE IOHEXOL 300 MG/ML  SOLN COMPARISON:  None. FINDINGS: Pharynx and larynx: No mucosal or submucosal  lesion is seen. Salivary glands: Parotid and submandibular glands are normal. Thyroid: Normal Lymph nodes: No adenopathy in the upper neck. Vascular: Material in venous structures show flow. There appears to be extrinsic compression of the inferior aspect of the internal jugular vein on the left. Limited intracranial: Normal Visualized orbits: Normal Mastoids and visualized paranasal sinuses: Clear Skeleton: Ordinary cervical spondylosis. See below regarding possible left sternoclavicular infection. Upper chest: See results of chest CT. Other: As noted on the previous chest CT, there is an extensive inflammatory process in the left lower neck and upper chest. The epicenter of this appears to be in the region of the sternoclavicular joint and this could arise from a septic sternoclavicular arthritis. There is abscess formation in the low left neck, with extension into the superior mediastinum on the left. The abscess is multilocular and insinuates around multiple structures in the left low neck and supraclavicular region as well as crossing the midline within the anterior low neck at the thoracic inlet region. IMPRESSION: Extensive regional inflammatory process in the left low neck, supraclavicular region and with extension to the superior mediastinum consistent with phlegmonous inflammation and multilocular abscess formation. Most common cause in this region would be complication from septic arthritis of the left sternoclavicular joint, though that is not definite. See above for full discussion. Incision and debridement may be necessary. Call report in progress. Electronically Signed   By: Nelson Chimes M.D.   On: 08/23/2018 14:02   Ct Chest W Contrast  Result Date: 08/22/2018 CLINICAL DATA:  Left upper chest discomfort EXAM: CT CHEST WITH CONTRAST TECHNIQUE: Multidetector CT imaging of the chest was performed during intravenous contrast administration. CONTRAST:  45mL ISOVUE-300 IOPAMIDOL (ISOVUE-300) INJECTION  61% COMPARISON:  Chest radiographs dated 05/22/2011 FINDINGS: Motion degraded images, limiting evaluation. Cardiovascular: Heart is normal in size.  No pericardial effusion. No evidence of thoracic aortic aneurysm. Mediastinum/Nodes: No suspicious mediastinal lymphadenopathy. Visualized thyroid is unremarkable. Lungs/Pleura: Moderate bilateral pleural effusions. Associated lower lobe compressive atelectasis. Evaluation of the lung parenchyma is constrained by respiratory motion. Within that constraint, there are no suspicious pulmonary nodules. No focal consolidation or frank interstitial edema. No pneumothorax. Upper Abdomen: Visualized upper abdomen is grossly unremarkable. Musculoskeletal: Subcutaneous gas in the left lower cervical/supraclavicular region (series 2/images 14, 17, and 20), poorly evaluated due to motion degradation. Overlying subcutaneous edema (series 2/image 13). This appearance suggests left neck cellulitis/infection, abscess not excluded due to the limitations of the current study. Mild body wall edema. Mild degenerative changes of the thoracic spine. IMPRESSION: Subcutaneous gas with edema in the left lower cervical/supraclavicular region, poorly evaluated due to motion degradation, but worrisome for left neck cellulitis/infection. Abscess cannot be excluded due to the poor quality of the study, given motion degradation. If further imaging is desired, consider CT neck with contrast. If possible, a right arm injection would be preferable. Moderate bilateral pleural effusions. No frank interstitial edema. Mild body wall edema. Electronically Signed   By: Julian Hy M.D.   On: 08/22/2018 21:57    Scheduled Meds: . amLODipine  10 mg Oral Daily  . atorvastatin  10 mg Oral Daily  . docusate sodium  100 mg Oral BID  . feeding supplement (PRO-STAT SUGAR FREE 64)  30 mL Oral BID  . heparin  5,000 Units Subcutaneous Q8H  . insulin aspart  0-15 Units Subcutaneous TID  WC  . insulin  aspart  0-5 Units Subcutaneous QHS  . insulin aspart  8 Units Subcutaneous TID WC  . insulin detemir  30 Units Subcutaneous Daily  . nystatin   Topical BID  . polyethylene glycol  17 g Oral Daily   Continuous Infusions: . sodium chloride Stopped (08/18/18 0947)  . 0.9 % NaCl with KCl 20 mEq / L 100 mL/hr at 08/23/18 1703  . ampicillin-sulbactam (UNASYN) IV 3 g (08/23/18 1548)     LOS: 6 days    Time spent: 30 minutes    Barton Dubois, MD [facility] Pager [phone]  If 7PM-7AM, please contact night-coverage [email or web address] Password [facility] 08/23/2018, 5:36 PM

## 2018-08-23 NOTE — Telephone Encounter (Signed)
Faxed form over to [employer] disability this morning and left message with patient wife that copy of forms are ready for pick up and have been faxed to company

## 2018-08-24 LAB — GLUCOSE, CAPILLARY
Glucose-Capillary: 69 mg/dL — ABNORMAL LOW (ref 70–99)
Glucose-Capillary: 205 mg/dL — ABNORMAL HIGH (ref 70–99)
Glucose-Capillary: 191 mg/dL — ABNORMAL HIGH (ref 70–99)
Glucose-Capillary: 75 mg/dL (ref 70–99)
Glucose-Capillary: 56 mg/dL — ABNORMAL LOW (ref 70–99)
Glucose-Capillary: 99 mg/dL (ref 70–99)

## 2018-08-24 LAB — BASIC METABOLIC PANEL
Anion gap: 6 (ref 5–15)
BUN: 26 mg/dL — ABNORMAL HIGH (ref 6–20)
CO2: 21 mmol/L — ABNORMAL LOW (ref 22–32)
Calcium: 7.6 mg/dL — ABNORMAL LOW (ref 8.9–10.3)
Chloride: 106 mmol/L (ref 98–111)
Creatinine, Ser: 1.45 mg/dL — ABNORMAL HIGH (ref 0.61–1.24)
GFR calc Af Amer: >60 mL/min (ref >60)
GFR calc non Af Amer: 55 mL/min — ABNORMAL LOW (ref >60)
Glucose, Bld: 110 mg/dL — ABNORMAL HIGH (ref 70–99)
Potassium: 4.6 mmol/L (ref 3.5–5.1)
Sodium: 133 mmol/L — ABNORMAL LOW (ref 135–145)

## 2018-08-24 LAB — CBC
HCT: 30.8 % — ABNORMAL LOW (ref 39.0–52.0)
Hemoglobin: 9.8 g/dL — ABNORMAL LOW (ref 13.0–17.0)
MCH: 25.7 pg — AB (ref 26.0–34.0)
MCHC: 31.8 g/dL (ref 30.0–36.0)
MCV: 80.6 fL (ref 80.0–100.0)
Platelets: 477 10*3/uL — ABNORMAL HIGH (ref 150–400)
RBC: 3.82 MIL/uL — AB (ref 4.22–5.81)
RDW: 14.6 % (ref 11.5–15.5)
WBC: 20.6 10*3/uL — ABNORMAL HIGH (ref 4.0–10.5)
nRBC: 0 % (ref 0.0–0.2)

## 2018-08-24 LAB — CULTURE, BLOOD (ROUTINE X 2)
Culture: NO GROWTH
Culture: NO GROWTH
Special Requests: ADEQUATE
Special Requests: ADEQUATE

## 2018-08-24 NOTE — Progress Notes (Signed)
CBG 56 patient is adiaphoric and warm. Daughter had just given him a sub sandwich and crackers to eat. Patient alert and orin. Check to see if patient gotten any Insulin today and he was cover at 4:30 P.M. with 8 units of regular meal coverage and 3 unit Regular [facility]ulin [facility]. With no dinner. Will recheck CBG after he eats his sandwich and crackers

## 2018-08-24 NOTE — Care Management Note (Signed)
Case Management Note  Patient Details  Name: Randy Oneal MRN: [id number] Date of Birth: 03-02-1966  Status of Service:  In process, will continue to follow  If discussed at Long Length of Stay Meetings, dates discussed:  08/24/2018  Additional Comments:  Sherald Barge, RN 08/24/2018, 12:25 PM

## 2018-08-24 NOTE — Progress Notes (Signed)
Patient to be transferred to [place]. Patient and family are made aware of transfer. Report called and given to Garner Nash RN. All questions were answered and no further questions at this time. Patient in stable condition and in no acute distress at time of discharge. Pt will be transported via [facility].

## 2018-08-24 NOTE — Progress Notes (Signed)
PROGRESS NOTE    Randy Oneal  ZOX:[id number] DOB: September 21, 1965 DOA: 08/16/2018 PCP: [name], MD   Brief Narrative:  Per HPI: Randy Oneal is a 53 y.o. male with medical history significant of type 2 diabetes, hypertension, renal insufficiency who is coming to the emergency department referred by his PCP Dr. Wolfgang Phoenix due to dehydration and uncontrolled diabetes.  He is unable to provide further history as he is only oriented to name.  He follows simple commands, but quickly forgets while he is doing.  I spoke to his wife [name] who stated the patient has been having polyuria, polydipsia for a while.  She also stated that he has some visual hallucinations over the past 2 days.  To her knowledge, he has not had head trauma, fever, chills, dyspnea, cough, chest pain, abdominal pain, vomiting or diarrhea.  No further history is available at this time.  Patient was admitted with diabetic, hyperosmolar nonketotic state with poor compliance on home insulin regimen.  He was also noted to have sepsis due to diabetic foot ulcer and bacteremia.  Assessment & Plan:   Principal Problem:   AKI (acute kidney injury) ([place]) Active Problems:   Type 2 diabetes mellitus not at goal [facility])   HTN (hypertension), benign   Personal history of noncompliance with medical treatment, presenting hazards to health   Hyperlipidemia associated with type 2 diabetes mellitus ([place])   Hyponatremia   Diabetic hyperosmolar non-ketotic state ([place])   Hyperglycemia   Cellulitis of great toe, right   1. Diabetic, hyperosmolar nonketotic state secondary to uncontrolled type 2 diabetes with poor medication compliance.  Patient's reported to be noncompliant with his medications at home.  A1c 13%.  Continue Levemir and 3 times daily NovoLog meal coverage along with a sliding scale insulin.  Follow CBGs fluctuation and further adjust hypoglycemic regimen as required.  Infection most likely responsible for his acute abnormal  sugar levels. 2. Sepsis secondary to right foot diabetic ulcer, left-sided neck and upper chest abscess; and strep B bacteremia: Continue as needed antipyretics, continue IV fluids, follow culture sensitivity.  Case discussed with ID service and following findings on the CT neck demonstrating gas leak, antibiotic has been broadened to Unasyn.  Patient continues spiking fever.  Patient will be transferred to [facility] for ENT evaluation. 3. AKI with baseline CKD stage Oneal.  Renal function has improved with fluid resuscitation.  Will continue to minimize nephrotoxic agents.  Follow renal function trend after contrast needed for CT scans evaluating neck abscess.  Will continue IV fluids.   4. Hyponatremia.  Likely secondary to element of dehydration as well as hyperglycemia.  This appears to be back to baseline (when calculating corrected sodium he is 131).  Patient has been instructed to keep himself well-hydrated orally.  Continue to follow electrolytes intermittently.  Continue IV fluids. 5. Hypokalemia.  Repleted and within normal limits at this time. 6. Hypertension-currently controlled.  Continue current antihypertensive regimen (using amlodipine and as needed labetalol). 7. Dyslipidemia.  Continue statins. 8. Skin fungal infection: Patient started on nystatin powder twice daily; advised to keep area clean and dry.  There is improvement in the redness that was affecting his groin and scrotum skin. 9. Left upper chest and left-sided neck induration/tenderness and erythema: CT scan findings demonstrating extensive abscess affecting deep tissue and extending to superior mediastinum.  Case discussed with cardiothoracic surgeons who recommended involvement of ENT for surgical debridement and further treatment.  After reviewing images they feel that  this may be associated with retropharyngeal infection/abscess.  Case was discussed with ID and given the presentation of The Images His Antibiotics Has  Been Broadened to Unasyn.  Will recommend formal ID consultation once the patient gets to [facility].  Dr. Benjamine Mola; will see him in consultation.     DVT prophylaxis: Heparin Code Status: Full Family Communication: None at bedside Disposition Plan: Remains inpatient.  Patient has continued spiking fever.  Repeat blood culture has remained without any growth.  Further work-up has demonstrated extensive abscess affecting deep tissues in his neck and extending to the superior mediastinum; there is positive mild gas appreciated on these images.  Consultants:   General surgery  ENT  ID (Dr. Tommy Medal).  Procedures:   See below for x-ray reports.  2-D echo: Preserved ejection fraction, no wall motion abnormalities, no vegetations appreciated on this study.  Antimicrobials:   None  Subjective: Low-grade temp overnight.  WBCs still elevated.  No chest pain, no shortness of breath, no nausea, no vomiting.  Objective: Vitals:   08/23/18 1957 08/23/18 2136 08/24/18 0557 08/24/18 0930  BP:  (!) 143/82 (!) 174/94 (!) 157/83  Pulse:  (!) 105 100 (!) 106  Resp:    20  Temp:  (!) 100.7 F (38.2 C) 99.4 F (37.4 C) 98.2 F (36.8 C)  TempSrc:  Oral Oral Oral  SpO2: 95% 97% 99%   Weight:      Height:        Intake/Output Summary (Last 24 hours) at 08/24/2018 1519 Last data filed at 08/24/2018 1158 Gross per 24 hour  Intake 2593.6 ml  Output 900 ml  Net 1693.6 ml   Filed Weights   08/16/18 1502  Weight: 82.6 kg    Examination: General exam: Alert, awake, oriented x 3; low-grade temperature overnight.  Reports feeling better overall.  No chest pain, no nausea, no vomiting, no shortness of breath.  Patient continued to have some left discomfort on the left hospital of his neck. Respiratory system: Clear to auscultation. Respiratory effort normal. Cardiovascular system: Mild sinus tachycardia. No murmurs, rubs, gallops. Gastrointestinal system: Abdomen is nondistended, soft  and nontender. No organomegaly or masses felt. Normal bowel sounds heard. Central nervous system: Alert and oriented. No focal neurological deficits. Extremities: No cyanosis or clubbing.  Right great toe callus appreciated on exam, no erythema or active drainage appreciated at this time.  No pain reported. Skin: No petechiae.  Erythema, swelling and tenderness palpation on the left [facility] of his neck and upper chest; induration felt on exam.  No crepitation. Psychiatry: Judgement and insight appear normal. Mood & affect appropriate.    Data Reviewed: I have personally reviewed following labs and imaging studies  CBC: Recent Labs  Lab 08/17/18 2149 08/18/18 0548 08/19/18 1128 08/21/18 0601 08/22/18 0613 08/24/18 0839  WBC 16.5* 19.1* 21.6* 21.1* 20.8* 20.6*  NEUTROABS 13.0* 15.2*  --   --   --   --   HGB 12.0* 12.5* 12.1* 10.5* 11.1* 9.8*  HCT 36.7* 38.7* 37.5* 32.4* 34.4* 30.8*  MCV 77.9* 77.7* 78.3* 78.3* 79.4* 80.6  PLT 175 181 238 303 386 462*   Basic Metabolic Panel: Recent Labs  Lab 08/19/18 0554 08/21/18 0601 08/22/18 0613 08/23/18 1010 08/24/18 0839  NA 130* 129* 129* 127* 133*  K 4.0 4.2 4.4 5.0 4.6  CL 102 100 100 100 106  CO2 19* 22 21* 20* 21*  GLUCOSE 173* 233* 168* 283* 110*  BUN 38* 30* 28* 26* 26*  CREATININE 1.63* 1.56* 1.54* 1.54* 1.45*  CALCIUM 7.2* 7.1* 7.3* 7.3* 7.6*   GFR: Estimated Creatinine Clearance: 61.5 mL/min (A) (by C-G formula based on SCr of 1.45 mg/dL (H)).   Liver Function Tests: Recent Labs  Lab 08/17/18 2149 08/18/18 0548  AST 89* 71*  ALT 45* 41  ALKPHOS 171* 178*  BILITOT 1.2 1.4*  PROT 4.9* 5.4*  ALBUMIN 1.6* 1.8*   Coagulation Profile: Recent Labs  Lab 08/17/18 2149  INR 1.01   CBG: Recent Labs  Lab 08/23/18 1117 08/23/18 1616 08/23/18 2137 08/24/18 0748 08/24/18 1131  GLUCAP 270* 225* 97 69* 205*   Sepsis Labs: Recent Labs  Lab 08/17/18 2149 08/18/18 0006  PROCALCITON 28.84  --     LATICACIDVEN 1.6 1.6    Recent Results (from the past 240 hour(s))  Culture, blood (routine x 2)     Status: Abnormal   Collection Time: 08/17/18 10:55 AM  Result Value Ref Range Status   Specimen Description BLOOD RIGHT ANTECUBITAL  Final   Special Requests   Final    BOTTLES DRAWN AEROBIC AND ANAEROBIC Blood Culture adequate volume   Culture  Setup Time   Final    IN BOTH AEROBIC AND ANAEROBIC BOTTLES GRAM POSITIVE COCCI IN CHAINS Gram Stain Report Called to,Read Back By and Verified With: PJ SEXTON,RN @0239  08/18/18 MKELLY Organism ID to follow    Culture GROUP B STREP(S.AGALACTIAE)ISOLATED (A)  Final   Report Status 08/20/2018 FINAL  Final   Organism ID, Bacteria GROUP B STREP(S.AGALACTIAE)ISOLATED  Final      Susceptibility   Group b strep(s.agalactiae)isolated - MIC*    CLINDAMYCIN <=0.25 SENSITIVE Sensitive     AMPICILLIN <=0.25 SENSITIVE Sensitive     ERYTHROMYCIN <=0.12 SENSITIVE Sensitive     VANCOMYCIN 0.5 SENSITIVE Sensitive     CEFTRIAXONE <=0.12 SENSITIVE Sensitive     LEVOFLOXACIN 4 INTERMEDIATE Intermediate     PENICILLIN Value in next row Sensitive      SENSITIVE<=0.12Performed at [facility], [place] [address] [place], [place] [address]    * GROUP B STREP(S.AGALACTIAE)ISOLATED  Blood Culture ID Panel (Reflexed)     Status: Abnormal   Collection Time: 08/17/18 10:55 AM  Result Value Ref Range Status   Enterococcus species NOT DETECTED NOT DETECTED Final   Listeria monocytogenes NOT DETECTED NOT DETECTED Final   Staphylococcus species NOT DETECTED NOT DETECTED Final   Staphylococcus aureus (BCID) NOT DETECTED NOT DETECTED Final   Streptococcus species DETECTED (A) NOT DETECTED Final    Comment: CRITICAL RESULT CALLED TO, READ BACK BY AND VERIFIED WITH: Forest Gleason PharmD 9:45 08/18/18 ([email or web address])    Streptococcus agalactiae DETECTED (A) NOT DETECTED Final    Comment: CRITICAL RESULT CALLED TO, READ BACK BY AND VERIFIED WITH: Forest Gleason PharmD 9:45 08/18/18  ([email or web address])    Streptococcus pneumoniae NOT DETECTED NOT DETECTED Final   Streptococcus pyogenes NOT DETECTED NOT DETECTED Final   Acinetobacter baumannii NOT DETECTED NOT DETECTED Final   Enterobacteriaceae species NOT DETECTED NOT DETECTED Final   Enterobacter cloacae complex NOT DETECTED NOT DETECTED Final   Escherichia coli NOT DETECTED NOT DETECTED Final   Klebsiella oxytoca NOT DETECTED NOT DETECTED Final   Klebsiella pneumoniae NOT DETECTED NOT DETECTED Final   Proteus species NOT DETECTED NOT DETECTED Final   Serratia marcescens NOT DETECTED NOT DETECTED Final   Haemophilus influenzae NOT DETECTED NOT DETECTED Final   Neisseria meningitidis NOT DETECTED NOT DETECTED Final   Pseudomonas aeruginosa NOT DETECTED NOT DETECTED Final  Candida albicans NOT DETECTED NOT DETECTED Final   Candida glabrata NOT DETECTED NOT DETECTED Final   Candida krusei NOT DETECTED NOT DETECTED Final   Candida parapsilosis NOT DETECTED NOT DETECTED Final   Candida tropicalis NOT DETECTED NOT DETECTED Final    Comment: Performed at [facility], [place] [address], [place], [place] [address]  Culture, blood (routine x 2)     Status: Abnormal   Collection Time: 08/17/18 11:04 AM  Result Value Ref Range Status   Specimen Description   Final    BLOOD LEFT HAND Performed at [facility], [address], [place], [place] [address]    Special Requests   Final    BOTTLES DRAWN AEROBIC ONLY Blood Culture results may not be optimal due to an inadequate volume of blood received in culture bottles Performed at [facility], [address], [place], [place] [address]    Culture  Setup Time   Final    AEROBIC BOTTLE ONLY GRAM POSITIVE COCCI IN CHAINS Gram Stain Report Called to,Read Back By and Verified With: PJ SEXTON,RN @0235  08/18/18 [facility] Performed at [facility], [address] [place], [place] [address]    Culture (A)  Final    GROUP B STREP(S.AGALACTIAE)ISOLATED SUSCEPTIBILITIES PERFORMED ON PREVIOUS  CULTURE WITHIN THE LAST 5 DAYS. Performed at [facility], [place] [address] [place], [place] [address]    Report Status 08/20/2018 FINAL  Final  Culture, blood (Routine X 2) w Reflex to ID Panel     Status: None   Collection Time: 08/19/18  6:58 PM  Result Value Ref Range Status   Specimen Description BLOOD RIGHT HAND  Final   Special Requests   Final    BOTTLES DRAWN AEROBIC AND ANAEROBIC Blood Culture adequate volume   Culture   Final    NO GROWTH 5 DAYS Performed at [facility], [address], [place], [place] [address]    Report Status 08/24/2018 FINAL  Final  Culture, blood (Routine X 2) w Reflex to ID Panel     Status: None   Collection Time: 08/19/18  7:07 PM  Result Value Ref Range Status   Specimen Description BLOOD LEFT HAND  Final   Special Requests   Final    BOTTLES DRAWN AEROBIC AND ANAEROBIC Blood Culture adequate volume   Culture   Final    NO GROWTH 5 DAYS Performed at [facility], [address], [place],  [address]    Report Status 08/24/2018 FINAL  Final     Radiology Studies: Ct Soft Tissue Neck W Contrast  Result Date: 08/23/2018 CLINICAL DATA:  Fullness of the left neck and supraclavicular region. Symptoms began several days ago. Abnormal chest CT. EXAM: CT NECK WITH CONTRAST TECHNIQUE: Multidetector CT imaging of the neck was performed using the standard protocol following the bolus administration of intravenous contrast. CONTRAST:  60mL OMNIPAQUE IOHEXOL 300 MG/ML  SOLN COMPARISON:  None. FINDINGS: Pharynx and larynx: No mucosal or submucosal lesion is seen. Salivary glands: Parotid and submandibular glands are normal. Thyroid: Normal Lymph nodes: No adenopathy in the upper neck. Vascular: Material in venous structures show flow. There appears to be extrinsic compression of the inferior aspect of the internal jugular vein on the left. Limited intracranial: Normal Visualized orbits: Normal Mastoids and visualized paranasal sinuses: Clear Skeleton:  Ordinary cervical spondylosis. See below regarding possible left sternoclavicular infection. Upper chest: See results of chest CT. Other: As noted on the previous chest CT, there is an extensive inflammatory  process in the left lower neck and upper chest. The epicenter of this appears to be in the region of the sternoclavicular joint and this could arise from a septic sternoclavicular arthritis. There is abscess formation in the low left neck, with extension into the superior mediastinum on the left. The abscess is multilocular and insinuates around multiple structures in the left low neck and supraclavicular region as well as crossing the midline within the anterior low neck at the thoracic inlet region. IMPRESSION: Extensive regional inflammatory process in the left low neck, supraclavicular region and with extension to the superior mediastinum consistent with phlegmonous inflammation and multilocular abscess formation. Most common cause in this region would be complication from septic arthritis of the left sternoclavicular joint, though that is not definite. See above for full discussion. Incision and debridement may be necessary. Call report in progress. Electronically Signed   By: Nelson Chimes M.D.   On: 08/23/2018 14:02   Ct Chest W Contrast  Result Date: 08/22/2018 CLINICAL DATA:  Left upper chest discomfort EXAM: CT CHEST WITH CONTRAST TECHNIQUE: Multidetector CT imaging of the chest was performed during intravenous contrast administration. CONTRAST:  30mL ISOVUE-300 IOPAMIDOL (ISOVUE-300) INJECTION 61% COMPARISON:  Chest radiographs dated 05/22/2011 FINDINGS: Motion degraded images, limiting evaluation. Cardiovascular: Heart is normal in size.  No pericardial effusion. No evidence of thoracic aortic aneurysm. Mediastinum/Nodes: No suspicious mediastinal lymphadenopathy. Visualized thyroid is unremarkable. Lungs/Pleura: Moderate bilateral pleural effusions. Associated lower lobe compressive atelectasis.  Evaluation of the lung parenchyma is constrained by respiratory motion. Within that constraint, there are no suspicious pulmonary nodules. No focal consolidation or frank interstitial edema. No pneumothorax. Upper Abdomen: Visualized upper abdomen is grossly unremarkable. Musculoskeletal: Subcutaneous gas in the left lower cervical/supraclavicular region (series 2/images 14, 17, and 20), poorly evaluated due to motion degradation. Overlying subcutaneous edema (series 2/image 13). This appearance suggests left neck cellulitis/infection, abscess not excluded due to the limitations of the current study. Mild body wall edema. Mild degenerative changes of the thoracic spine. IMPRESSION: Subcutaneous gas with edema in the left lower cervical/supraclavicular region, poorly evaluated due to motion degradation, but worrisome for left neck cellulitis/infection. Abscess cannot be excluded due to the poor quality of the study, given motion degradation. If further imaging is desired, consider CT neck with contrast. If possible, a right arm injection would be preferable. Moderate bilateral pleural effusions. No frank interstitial edema. Mild body wall edema. Electronically Signed   By: Julian Hy M.D.   On: 08/22/2018 21:57    Scheduled Meds: . amLODipine  10 mg Oral Daily  . atorvastatin  10 mg Oral Daily  . docusate sodium  100 mg Oral BID  . feeding supplement (PRO-STAT SUGAR FREE 64)  30 mL Oral BID  . heparin  5,000 Units Subcutaneous Q8H  . insulin aspart  0-15 Units Subcutaneous TID WC  . insulin aspart  0-5 Units Subcutaneous QHS  . insulin aspart  8 Units Subcutaneous TID WC  . insulin detemir  30 Units Subcutaneous Daily  . nystatin   Topical BID  . polyethylene glycol  17 g Oral Daily   Continuous Infusions: . sodium chloride Stopped (08/18/18 0947)  . 0.9 % NaCl with KCl 20 mEq / L 100 mL/hr at 08/23/18 1703  . ampicillin-sulbactam (UNASYN) IV 3 g (08/24/18 0935)     LOS: 7 days    Time  spent: 30 minutes    Barton Dubois, MD [facility] Pager [phone]  If 7PM-7AM, please contact night-coverage [email or web address] Password [facility] 08/24/2018,  3:19 PM

## 2018-08-24 NOTE — Progress Notes (Signed)
CBG 99 and patient feels better Skin warm and dry. Butch Penny R.N. aware of the events.

## 2018-08-24 NOTE — Progress Notes (Signed)
Text page Forrest Moron N.P. of patient arrival.

## 2018-08-24 NOTE — Progress Notes (Signed)
Patient call land felt like his sugar was dropping. Patient stated he has not eaten since lunch time. Patient was given soup graham crackers and peanut butter and Frozen diner.

## 2018-08-24 NOTE — Progress Notes (Signed)
CBG don on arrival 40.

## 2018-08-25 DIAGNOSIS — E119 Type 2 diabetes mellitus without complications: Secondary | ICD-10-CM

## 2018-08-25 DIAGNOSIS — L0211 Cutaneous abscess of neck: Secondary | ICD-10-CM

## 2018-08-25 DIAGNOSIS — E871 Hypo-osmolality and hyponatremia: Secondary | ICD-10-CM

## 2018-08-25 DIAGNOSIS — I10 Essential (primary) hypertension: Secondary | ICD-10-CM

## 2018-08-25 DIAGNOSIS — R739 Hyperglycemia, unspecified: Secondary | ICD-10-CM

## 2018-08-25 DIAGNOSIS — N179 Acute kidney failure, unspecified: Secondary | ICD-10-CM

## 2018-08-25 LAB — GLUCOSE, CAPILLARY
GLUCOSE-CAPILLARY: 162 mg/dL — AB (ref 70–99)
Glucose-Capillary: 144 mg/dL — ABNORMAL HIGH (ref 70–99)
Glucose-Capillary: 117 mg/dL — ABNORMAL HIGH (ref 70–99)
Glucose-Capillary: 55 mg/dL — ABNORMAL LOW (ref 70–99)
Glucose-Capillary: 89 mg/dL (ref 70–99)

## 2018-08-25 NOTE — Consult Note (Signed)
Reason for Consult: Left neck and chest wall infection  HPI:  Randy Oneal is an 53 y.o. 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 The patient has multiple medical problems, including uncontrolled hyperglycemia, hypertension, right foot diabetic ulcer, and chronic kidney disease. Patient was sent to the [facility] by his primary care provider due to altered mentation. He was noted to have swelling of his left upper chest wall and left lower neck. His CT scan showed septic sternoclavicular arthritis that has spread to the mediastinum and the lower neck. The patient denies and dysphagia or odynophagia. No previous history of neck surgery. Now on Unasyn.  Past Medical History:  Diagnosis Date  . Diabetes mellitus    Insulin dependent  . Hypertension   . Renal insufficiency     Past Surgical History:  Procedure Laterality Date  . COLONOSCOPY  06/24/2011   Procedure: COLONOSCOPY;  Surgeon: [name], MD;  Location: [facility];  Service: Endoscopy;  Laterality: N/A;  8:30 AM    Family History  Problem Relation Age of Onset  . Hypertension Mother   . Colon cancer Neg Hx     Social History:  reports that he has never smoked. He has never used smokeless tobacco. He reports that he does not drink alcohol or use drugs.  Allergies: No Known Allergies  Prior to Admission medications   Medication Sig Start Date End Date Taking? Authorizing Provider  acetaminophen (TYLENOL) 500 MG tablet Take 1,000 mg by mouth every 6 (six) hours as needed for mild pain or moderate pain.   Yes [provider]  amLODipine (NORVASC) 10 MG tablet Take 1 tablet (10 mg total) by mouth daily. 01/26/18  Yes [name], MD  atorvastatin (LIPITOR) 10 MG tablet Take 1 tablet (10 mg total) by mouth daily. 01/26/18  Yes [name], MD  ibuprofen (ADVIL,MOTRIN) 200  MG tablet Take 400 mg by mouth every 6 (six) hours as needed for mild pain or moderate pain.   Yes [provider]  Ibuprofen-diphenhydrAMINE Cit (ADVIL PM) 200-38 MG TABS Take 2 tablets by mouth at bedtime as needed (for pain/sleep).   Yes [provider]  lisinopril (PRINIVIL,ZESTRIL) 20 MG tablet Take 1 tablet (20 mg total) by mouth daily. 01/26/18  Yes [name], MD  sildenafil (REVATIO) 20 MG tablet May take up to 5 before relations as directed 03/04/17  Yes Luking, [name], MD  Continuous Blood Gluc Sensor ([place]) MISC Use one sensor every 10 days. 08/10/17   [name], MD  glucose blood (BAYER CONTOUR NEXT TEST) test strip USE TO TEST FOUR TIMES DAILY. 07/28/17   [name], MD  insulin glargine (LANTUS) 100 UNIT/ML injection Inject 0.3 mLs (30 Units total) into the skin at bedtime. 08/16/18   [name], MD  insulin lispro (HUMALOG) 100 UNIT/ML cartridge Inject 0.08 mLs (8 Units total) into the skin 3 (three) times daily with meals. 08/16/18   [name], MD  Insulin Pen Needle (B-D ULTRAFINE Oneal SHORT PEN) 31G X 8 MM MISC 1 each by Does not apply route as directed. 12/07/15   [name], MD  MICROLET LANCETS MISC USE FOUR TIMES A DAY AS DIRECTED. 11/19/16   [name], [name], MD  NOVOFINE 32G X 6 MM MISC USE AS DIRECTED WITH INSULIN PEN FOUR TIMES  DAILY. 11/20/14   [name], MD  sulfamethoxazole-trimethoprim (BACTRIM DS,SEPTRA DS) 800-160 MG tablet Take 1 tablet by mouth 2 (two) times daily. 08/16/18   [name], MD    Medications:  I have reviewed the patient's current medications. Scheduled: . amLODipine  10 mg Oral Daily  . atorvastatin  10 mg Oral Daily  . docusate sodium  100 mg Oral BID  . feeding supplement (PRO-STAT SUGAR FREE 64)  30 mL Oral BID  . heparin  5,000 Units Subcutaneous Q8H  . insulin aspart  0-15 Units Subcutaneous TID WC  . insulin aspart  0-5 Units Subcutaneous QHS   . insulin aspart  8 Units Subcutaneous TID WC  . insulin detemir  30 Units Subcutaneous Daily  . nystatin   Topical BID  . polyethylene glycol  17 g Oral Daily   Continuous: . sodium chloride Stopped (08/18/18 0947)  . 0.9 % NaCl with KCl 20 mEq / L 75 mL/hr at 08/25/18 0614  . ampicillin-sulbactam (UNASYN) IV 3 g (08/25/18 1705)   ROS:  Pertinent items are noted in HPI.  Blood pressure 133/81, pulse (!) 108, temperature 98.5 F (36.9 C), temperature source Oral, resp. rate 13, height 5\' 10"  (1.778 m), weight 97.5 kg, SpO2 100 %. Physical Exam: Pleasant black male no acute distress Head is normocephalic, atraumatic Eyes: His pupils are equal, round, reactive to light. Extraocular motion is intact.  Ears: Examination of the ears shows normal auricles and external auditory canals bilaterally.   Nose: Nasal examination shows normal mucosa, septum, turbinates.  Face: Facial examination shows no asymmetry. Palpation of the face elicit no significant tenderness.  Mouth: Oral cavity examination shows no mucosal abnormality. No significant trismus is noted.  Neck/chest: Palpation of the neck/chest reveals significant induration over the left sternoclavicular joint, spreading to the upper chest and left lower neck. Trachea is midline. Chest: Normal respiratory rhythm. No stridor or SOB.   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 There is no evidence of retropharyngeal infection. - Continue IV Unasyn. - ID and Thoracic consults. - Since the source of the infection is likely the sternoclavicular joint (based on physical exam and CT images), debridement of the neck infection alone will not be adequate to treat the infection. - Will follow.  Leisl Spurrier W Romain Erion 08/25/2018, 1:34 PM

## 2018-08-25 NOTE — Plan of Care (Signed)
  Problem: Education: Goal: Knowledge of General Education information will improve Description Including pain rating scale, medication(s)/side effects and non-pharmacologic comfort measures Outcome: Progressing   Problem: Health Behavior/Discharge Planning: Goal: Ability to manage health-related needs will improve Outcome: Progressing   

## 2018-08-25 NOTE — Progress Notes (Signed)
PROGRESS NOTE    Randy Oneal  XVQ:[id number] DOB: 1966/01/08 DOA: 08/16/2018 PCP: [name], MD    Brief Narrative:  53 year old male who presented with uncontrolled hyperglycemia.  He does have significant past medical history for type 2 diabetes mellitus, hypertension and chronic kidney disease.  Patient was sent to the hospital by his primary care provider due to altered mentation.  He was not able to give any detailed history.  Per his wife he had significant polyuria and polydipsia, with visual hallucinations for the last 2 days.  On his initial physical examination temperature 98.6, heart rate 114, respirate 18, blood pressure 136/104, oxygen saturation 99%.  Dry mucous membranes, heart S1-S2 present, tachycardic, lungs clear to auscultation bilaterally, abdomen soft nontender, no lower extremity edema.  Positive right foot diabetic ulcer.  Patient was disoriented and weak.  Sodium 121, potassium 4.1, chloride 82, bicarb 24, glucose 432, BUN 67, creatinine 2.7, 0.8, hemoglobin 14.1, hematocrit 43.0, platelets 144.   Patient was admitted to the hospital working diagnosis of hyperosmolar nonketotic state, duplicated by right foot diabetic ulcer.   Assessment & Plan:   Principal Problem:   AKI (acute kidney injury) ([place]) Active Problems:   Type 2 diabetes mellitus not at goal [facility])   HTN (hypertension), benign   Personal history of noncompliance with medical treatment, presenting hazards to health   Hyperlipidemia associated with type 2 diabetes mellitus ([place])   Hyponatremia   Diabetic hyperosmolar non-ketotic state ([place])   Hyperglycemia   Cellulitis of great toe, right   1. Sepsis due to right foot ulcer, chest wall abscess and strep B bacteremia. (present on admission) Will continue antibiotic therapy with unasyn, follow on ent recommendations. Continue pain control, follow on cell count, cultures and temperature curve.   2. T2dm with uncontrolled hyperglycemia. Continue  glucose control with insulin levimir 30 units, plus 8 units of aspart before meals, along with insulin sliding scale. Patient tolerating po well.  3. HTN. Will continue blood pressure control with amlodipine and with labetalol as needed,   4. AKI on CKD stage 3, with hypokalemia and hyponatremia. Stable renal function with serum cr at 1,45, with K at 4,6 and serum bicarbonate at 21.   5. Dyslipidemia. Continue statin therapy with good toleration.   DVT prophylaxis: enoxaparin   Code Status: full Family Communication: no family at the bedside  Disposition Plan/ discharge barriers: pending ent evaluation possible drainage.   Body mass index is 30.84 kg/m. Malnutrition Type:  Nutrition Problem: Food and nutrition related knowledge deficit Etiology: limited prior education, wound healing   Malnutrition Characteristics:  Signs/Symptoms: per patient/family report   Nutrition Interventions:  Interventions: Education, MVI, Snacks  RN Pressure Injury Documentation:     Consultants:   ENT  Procedures:     Antimicrobials:   Unasyn IV    Subjective: Patient continue to have pain at the anterior left upper chest wall, no dysphagia or odynophagia. No nausea or vomiting.   Objective: Vitals:   08/25/18 0604 08/25/18 0845 08/25/18 0849 08/25/18 1300  BP: (!) 144/72  (!) 160/78 133/81  Pulse:   100 (!) 108  Resp: 17 (!) 21 18 13   Temp: 98.9 F (37.2 C)  98.7 F (37.1 C) 98.5 F (36.9 C)  TempSrc: Oral  Oral Oral  SpO2:   96% 100%  Weight: 97.5 kg     Height:        Intake/Output Summary (Last 24 hours) at 08/25/2018 1303 Last data filed at 08/25/2018 1132  Gross per 24 hour  Intake 1280 ml  Output 1700 ml  Net -420 ml   Filed Weights   08/16/18 1502 08/25/18 0604  Weight: 82.6 kg 97.5 kg    Examination:   General: deconditioned  Neurology: Awake and alert, non focal  E ENT: mild pallor, no icterus, oral mucosa moist Cardiovascular: No JVD. S1-S2 present,  rhythmic, no gallops, rubs, or murmurs. No lower extremity edema. Pulmonary: positive breath sounds bilaterally, adequate air movement, no wheezing, rhonchi or rales. Gastrointestinal. Abdomen with no organomegaly, non tender, no rebound or guarding Skin. Left upper chest wall tender to palpation, indurated with erythema and increase local temperature. No purulence.  Musculoskeletal: no joint deformities     Data Reviewed: I have personally reviewed following labs and imaging studies  CBC: Recent Labs  Lab 08/19/18 1128 08/21/18 0601 08/22/18 0613 08/24/18 0839  WBC 21.6* 21.1* 20.8* 20.6*  HGB 12.1* 10.5* 11.1* 9.8*  HCT 37.5* 32.4* 34.4* 30.8*  MCV 78.3* 78.3* 79.4* 80.6  PLT 238 303 386 161*   Basic Metabolic Panel: Recent Labs  Lab 08/19/18 0554 08/21/18 0601 08/22/18 0613 08/23/18 1010 08/24/18 0839  NA 130* 129* 129* 127* 133*  K 4.0 4.2 4.4 5.0 4.6  CL 102 100 100 100 106  CO2 19* 22 21* 20* 21*  GLUCOSE 173* 233* 168* 283* 110*  BUN 38* 30* 28* 26* 26*  CREATININE 1.63* 1.56* 1.54* 1.54* 1.45*  CALCIUM 7.2* 7.1* 7.3* 7.3* 7.6*   GFR: Estimated Creatinine Clearance: 69.8 mL/min (A) (by C-G formula based on SCr of 1.45 mg/dL (H)). Liver Function Tests: No results for input(s): AST, ALT, ALKPHOS, BILITOT, PROT, ALBUMIN in the last 168 hours. No results for input(s): LIPASE, AMYLASE in the last 168 hours. No results for input(s): AMMONIA in the last 168 hours. Coagulation Profile: No results for input(s): INR, PROTIME in the last 168 hours. Cardiac Enzymes: No results for input(s): CKTOTAL, CKMB, CKMBINDEX, TROPONINI in the last 168 hours. BNP (last 3 results) No results for input(s): PROBNP in the last 8760 hours. HbA1C: No results for input(s): HGBA1C in the last 72 hours. CBG: Recent Labs  Lab 08/24/18 1916 08/24/18 2114 08/24/18 2153 08/25/18 0600 08/25/18 1131  GLUCAP 75 56* 99 144* 162*   Lipid Profile: No results for input(s): CHOL, HDL,  LDLCALC, TRIG, CHOLHDL, LDLDIRECT in the last 72 hours. Thyroid Function Tests: No results for input(s): TSH, T4TOTAL, FREET4, T3FREE, THYROIDAB in the last 72 hours. Anemia Panel: No results for input(s): VITAMINB12, FOLATE, FERRITIN, TIBC, IRON, RETICCTPCT in the last 72 hours.    Radiology Studies: I have reviewed all of the imaging during this hospital visit personally     Scheduled Meds: . amLODipine  10 mg Oral Daily  . atorvastatin  10 mg Oral Daily  . docusate sodium  100 mg Oral BID  . feeding supplement (PRO-STAT SUGAR FREE 64)  30 mL Oral BID  . heparin  5,000 Units Subcutaneous Q8H  . insulin aspart  0-15 Units Subcutaneous TID WC  . insulin aspart  0-5 Units Subcutaneous QHS  . insulin aspart  8 Units Subcutaneous TID WC  . insulin detemir  30 Units Subcutaneous Daily  . nystatin   Topical BID  . polyethylene glycol  17 g Oral Daily   Continuous Infusions: . sodium chloride Stopped (08/18/18 0947)  . 0.9 % NaCl with KCl 20 mEq / L 75 mL/hr at 08/25/18 0614  . ampicillin-sulbactam (UNASYN) IV 3 g (08/25/18 1128)  LOS: 8 days        Tawni Millers, MD [facility] Pager [phone]

## 2018-08-25 NOTE — Progress Notes (Signed)
Inpatient Diabetes Program Recommendations  AACE/ADA: New Consensus Statement on Inpatient Glycemic Control (2015)  Target Ranges:  Prepandial:   less than 140 mg/dL      Peak postprandial:   less than 180 mg/dL (1-2 hours)      Critically ill patients:  140 - 180 mg/dL   Lab Results  Component Value Date   GLUCAP 162 (H) 08/25/2018   HGBA1C 13.0 (A) 08/16/2018    Patient has signed and been given consent for CGM/[employer] study.  Patient has been assigned to control group and will not receive CGM at d/c. Diabetes Quality of Life survey administered.  Patient understands that diabetes coordinator will call them 1 time after discharge (between days 30-35 after d/c from hospital).  Thank you, Nani Gasser. Marlo Goodrich, RN, MSN, CDE  Diabetes Coordinator Inpatient Glycemic Control Team Team Pager [phone] (8am-5pm) 08/25/2018 2:50 PM

## 2018-08-26 DIAGNOSIS — M00812 Arthritis due to other bacteria, left shoulder: Secondary | ICD-10-CM

## 2018-08-26 DIAGNOSIS — L03313 Cellulitis of chest wall: Secondary | ICD-10-CM

## 2018-08-26 DIAGNOSIS — I129 Hypertensive chronic kidney disease with stage 1 through stage 4 chronic kidney disease, or unspecified chronic kidney disease: Secondary | ICD-10-CM

## 2018-08-26 DIAGNOSIS — B9689 Other specified bacterial agents as the cause of diseases classified elsewhere: Secondary | ICD-10-CM

## 2018-08-26 DIAGNOSIS — L0291 Cutaneous abscess, unspecified: Secondary | ICD-10-CM

## 2018-08-26 DIAGNOSIS — R7881 Bacteremia: Secondary | ICD-10-CM

## 2018-08-26 DIAGNOSIS — E119 Type 2 diabetes mellitus without complications: Secondary | ICD-10-CM

## 2018-08-26 DIAGNOSIS — N189 Chronic kidney disease, unspecified: Secondary | ICD-10-CM

## 2018-08-26 DIAGNOSIS — E1100 Type 2 diabetes mellitus with hyperosmolarity without nonketotic hyperglycemic-hyperosmolar coma (NKHHC): Secondary | ICD-10-CM

## 2018-08-26 DIAGNOSIS — L0211 Cutaneous abscess of neck: Secondary | ICD-10-CM

## 2018-08-26 DIAGNOSIS — E1122 Type 2 diabetes mellitus with diabetic chronic kidney disease: Secondary | ICD-10-CM

## 2018-08-26 LAB — GLUCOSE, CAPILLARY
GLUCOSE-CAPILLARY: 201 mg/dL — AB (ref 70–99)
GLUCOSE-CAPILLARY: 102 mg/dL — AB (ref 70–99)
Glucose-Capillary: 205 mg/dL — ABNORMAL HIGH (ref 70–99)
Glucose-Capillary: 238 mg/dL — ABNORMAL HIGH (ref 70–99)

## 2018-08-26 LAB — SURGICAL PCR SCREEN
MRSA, PCR: NEGATIVE
STAPHYLOCOCCUS AUREUS: NEGATIVE

## 2018-08-26 NOTE — Progress Notes (Signed)
Subjective: Pt with no new complaint. The left upper chest and lower neck are still tender to touch.  Objective: Vital signs in last 24 hours: Temp:  [97.6 F (36.4 C)-98.8 F (37.1 C)] 97.6 F (36.4 C) (01/16 1206) Pulse Rate:  [99-108] 99 (01/16 1206) Resp:  [13-24] 18 (01/16 1206) BP: (133-163)/(81-86) 163/86 (01/16 1206) SpO2:  [99 %-100 %] 99 % (01/16 1206)  Physical Exam:Pleasant black male no acute distress Head: Normocephalic, atraumatic Eyes: His pupils are equal, round, reactive to light. Extraocular motion is intact.  Ears: Examination of the ears shows normal auricles and external auditory canals bilaterally.   Nose: Nasal examination shows normal mucosa, septum, turbinates.  Face: Facial examination shows no asymmetry. Palpation of the face elicit no significant tenderness.  Mouth: Oral cavity examination shows no mucosal abnormality. No significant trismus is noted.  Neck/chest: Palpation of the neck/chest reveals significant induration over the left sternoclavicular joint, spreading to the upper chest and left lower neck. Trachea is midline. Chest: Normal respiratory rhythm. No stridor or SOB.   Recent Labs    08/24/18 0839  WBC 20.6*  HGB 9.8*  HCT 30.8*  PLT 477*   Recent Labs    08/24/18 0839  NA 133*  K 4.6  CL 106  CO2 21*  GLUCOSE 110*  BUN 26*  CREATININE 1.45*  CALCIUM 7.6*    Medications:  I have reviewed the patient's current medications. Scheduled: . amLODipine  10 mg Oral Daily  . atorvastatin  10 mg Oral Daily  . docusate sodium  100 mg Oral BID  . feeding supplement (PRO-STAT SUGAR FREE 64)  30 mL Oral BID  . heparin  5,000 Units Subcutaneous Q8H  . insulin aspart  0-15 Units Subcutaneous TID WC  . insulin aspart  0-5 Units Subcutaneous QHS  . insulin aspart  8 Units Subcutaneous TID WC  . insulin detemir  30 Units Subcutaneous Daily  . nystatin   Topical BID  . polyethylene glycol  17 g Oral Daily   Continuous: . sodium  chloride Stopped (08/18/18 0947)  . 0.9 % NaCl with KCl 20 mEq / L 75 mL/hr at 08/26/18 1022  . 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 There is no evidence of retropharyngeal infection. - Continue IV Unasyn. - Recommend ID and Thoracic consults. - Debridement of the neck infection alone likely will not be adequate to treat the infection. - Will follow.   LOS: 9 days   Moncerrat Burnstein W Natalea Sutliff 08/26/2018, 12:31 PM

## 2018-08-26 NOTE — Progress Notes (Signed)
Inpatient Diabetes Program Recommendations  AACE/ADA: New Consensus Statement on Inpatient Glycemic Control (2015)  Target Ranges:  Prepandial:   less than 140 mg/dL      Peak postprandial:   less than 180 mg/dL (1-2 hours)      Critically ill patients:  140 - 180 mg/dL   Lab Results  Component Value Date   GLUCAP 238 (H) 08/26/2018   HGBA1C 13.0 (A) 08/16/2018    Review of Glycemic Control Results for Randy Oneal, Randy Oneal (MRN [id number]) as of 08/26/2018 12:28  Ref. Range 08/25/2018 16:11 08/25/2018 22:01 08/25/2018 22:53 08/26/2018 06:54 08/26/2018 11:21  Glucose-Capillary Latest Ref Range: 70 - 99 mg/dL 117 (H) 55 (L) 89 205 (H) 238 (H)   Inpatient Diabetes Program Recommendations:   -Decrease Novolog to sensitive tid & hs  Thank you, Nani Gasser. Yazlin Ekblad, RN, MSN, CDE  Diabetes Coordinator Inpatient Glycemic Control Team Team Pager [phone] (8am-5pm) 08/26/2018 12:30 PM

## 2018-08-26 NOTE — Consult Note (Signed)
[address]       [place] [address]             [phone]      Cardiothoracic Surgery Consultation  Reason for Consult: Left chest wall and neck abscess with mediastinal extension Referring Physician: Dr. Acquanetta Sit III is an 52 y.o. male.  HPI:   The patient is a 53 year old gentleman with history of hypertension and diabetes who said that he had been trimming his own callused skin over the medial aspect of the right great toe and then presented to [facility] on 08/17/2018 with DKA, confusion, acute kidney injury with a creatinine of 2.27, hyponatremia, and signs of sepsis.  He was noted to have some cellulitis and drainage from the right great toe.  He was also noted to have a plantar pustule on the left foot.  He was seen by Dr. Arnoldo Morale from general surgery and was not felt to have any drainable abscess no osteomyelitis.  No surgical intervention was warranted.  Blood cultures grew group B strep.  He was treated with antibiotics for sepsis.  He says that earlier this week he was noted to have some swelling and tenderness over the left lower neck and sternoclavicular region.  A CT scan of the neck and chest showed an abscess in the lower neck with fluid and air present extending slightly behind the upper part of the manubrium.  The sternoclavicular joint was intact without any signs of osteomyelitis.  He was seen by ENT and there is not felt to be any source in the neck or oropharynx. The patient was transferred to [facility] for further care.  Past Medical History:  Diagnosis Date  . Diabetes mellitus    Insulin dependent  . Hypertension   . Renal insufficiency     Past Surgical History:  Procedure Laterality Date  . COLONOSCOPY  06/24/2011   Procedure: COLONOSCOPY;  Surgeon: [name], MD;  Location: [facility];  Service: Endoscopy;  Laterality: N/A;  8:30 AM    Family History  Problem Relation Age of Onset  . Hypertension  Mother   . Colon cancer Neg Hx     Social History:  reports that he has never smoked. He has never used smokeless tobacco. He reports that he does not drink alcohol or use drugs.  Allergies: No Known Allergies  Medications:  I have reviewed the patient's current medications. Prior to Admission:  Medications Prior to Admission  Medication Sig Dispense Refill Last Dose  . acetaminophen (TYLENOL) 500 MG tablet Take 1,000 mg by mouth every 6 (six) hours as needed for mild pain or moderate pain.   08/15/2018 at Unknown time  . amLODipine (NORVASC) 10 MG tablet Take 1 tablet (10 mg total) by mouth daily. 30 tablet 6 08/15/2018 at Unknown time  . atorvastatin (LIPITOR) 10 MG tablet Take 1 tablet (10 mg total) by mouth daily. 30 tablet 06 08/15/2018 at Unknown time  . ibuprofen (ADVIL,MOTRIN) 200 MG tablet Take 400 mg by mouth every 6 (six) hours as needed for mild pain or moderate pain.   08/15/2018 at Unknown time  . Ibuprofen-diphenhydrAMINE Cit (ADVIL PM) 200-38 MG TABS Take 2 tablets by mouth at bedtime as needed (for pain/sleep).   08/15/2018 at Unknown time  . lisinopril (PRINIVIL,ZESTRIL) 20 MG tablet Take 1 tablet (20 mg total) by mouth daily. 30 tablet 6 08/15/2018 at Unknown time  . sildenafil (REVATIO) 20 MG tablet  May take up to 5 before relations as directed 50 tablet 4 unknown  . Continuous Blood Gluc Sensor (FREESTYLE LIBRE SENSOR SYSTEM) MISC Use one sensor every 10 days. 3 each 2 Taking  . glucose blood (BAYER CONTOUR NEXT TEST) test strip USE TO TEST FOUR TIMES DAILY. 150 each 2 Taking  . Insulin Pen Needle (B-D ULTRAFINE III SHORT PEN) 31G X 8 MM MISC 1 each by Does not apply route as directed. 150 each 3 Taking  . MICROLET LANCETS MISC USE FOUR TIMES A DAY AS DIRECTED. 100 each 0 Taking  . NOVOFINE 32G X 6 MM MISC USE AS DIRECTED WITH INSULIN PEN FOUR TIMES DAILY. 100 each 0 Taking   Scheduled: . amLODipine  10 mg Oral Daily  . atorvastatin  10 mg Oral Daily  . docusate sodium  100 mg  Oral BID  . feeding supplement (PRO-STAT SUGAR FREE 64)  30 mL Oral BID  . heparin  5,000 Units Subcutaneous Q8H  . insulin aspart  0-15 Units Subcutaneous TID WC  . insulin aspart  0-5 Units Subcutaneous QHS  . insulin aspart  8 Units Subcutaneous TID WC  . insulin detemir  30 Units Subcutaneous Daily  . nystatin   Topical BID  . polyethylene glycol  17 g Oral Daily   Continuous: . sodium chloride Stopped (08/18/18 0947)  . 0.9 % NaCl with KCl 20 mEq / L 75 mL/hr at 08/26/18 1022  . ampicillin-sulbactam (UNASYN) IV 3 g (08/26/18 1714)   EQA:STMHDQ chloride, acetaminophen **OR** acetaminophen, labetalol, ondansetron **OR** ondansetron (ZOFRAN) IV Anti-infectives (From admission, onward)   Start     Dose/Rate Route Frequency Ordered Stop   08/23/18 1400  Ampicillin-Sulbactam (UNASYN) 3 g in sodium chloride 0.9 % 100 mL IVPB     3 g 200 mL/hr over 30 Minutes Intravenous Every 6 hours 08/23/18 1254     08/18/18 2100  vancomycin (VANCOCIN) IVPB 1000 mg/200 mL premix  Status:  Discontinued     1,000 mg 200 mL/hr over 60 Minutes Intravenous Every 24 hours 08/17/18 2134 08/18/18 1315   08/18/18 1600  penicillin G potassium 4 Million Units in dextrose 5 % 250 mL IVPB  Status:  Discontinued     4 Million Units 250 mL/hr over 60 Minutes Intravenous Every 4 hours 08/18/18 1315 08/23/18 1254   08/17/18 2200  metroNIDAZOLE (FLAGYL) IVPB 500 mg  Status:  Discontinued     500 mg 100 mL/hr over 60 Minutes Intravenous Every 8 hours 08/17/18 2108 08/18/18 1315   08/17/18 2200  ceFEPIme (MAXIPIME) 1 g in sodium chloride 0.9 % 100 mL IVPB  Status:  Discontinued     1 g 200 mL/hr over 30 Minutes Intravenous Every 12 hours 08/17/18 2132 08/18/18 1315   08/17/18 2145  vancomycin (VANCOCIN) 1,500 mg in sodium chloride 0.9 % 500 mL IVPB     1,500 mg 250 mL/hr over 120 Minutes Intravenous  Once 08/17/18 2132 08/18/18 0024   08/17/18 2130  vancomycin (VANCOCIN) IVPB 1000 mg/200 mL premix  Status:   Discontinued     1,000 mg 200 mL/hr over 60 Minutes Intravenous  Once 08/17/18 2106 08/17/18 2132   08/17/18 2115  cefTRIAXone (ROCEPHIN) 2 g in sodium chloride 0.9 % 100 mL IVPB  Status:  Discontinued     2 g 200 mL/hr over 30 Minutes Intravenous Every 24 hours 08/17/18 2106 08/17/18 2109   08/16/18 0000  sulfamethoxazole-trimethoprim (BACTRIM DS,SEPTRA DS) 800-160 MG tablet     1 tablet Oral  2 times daily 08/16/18 2215        Results for orders placed or performed during the hospital encounter of 08/16/18 (from the past 48 hour(s))  Glucose, capillary     Status: None   Collection Time: 08/24/18  7:16 PM  Result Value Ref Range   Glucose-Capillary 75 70 - 99 mg/dL   Comment 1 Notify RN   Glucose, capillary     Status: Abnormal   Collection Time: 08/24/18  9:14 PM  Result Value Ref Range   Glucose-Capillary 56 (L) 70 - 99 mg/dL  Glucose, capillary     Status: None   Collection Time: 08/24/18  9:53 PM  Result Value Ref Range   Glucose-Capillary 99 70 - 99 mg/dL  Glucose, capillary     Status: Abnormal   Collection Time: 08/25/18  6:00 AM  Result Value Ref Range   Glucose-Capillary 144 (H) 70 - 99 mg/dL  Glucose, capillary     Status: Abnormal   Collection Time: 08/25/18 11:31 AM  Result Value Ref Range   Glucose-Capillary 162 (H) 70 - 99 mg/dL   Comment 1 Notify RN    Comment 2 Document in Chart   Glucose, capillary     Status: Abnormal   Collection Time: 08/25/18  4:11 PM  Result Value Ref Range   Glucose-Capillary 117 (H) 70 - 99 mg/dL   Comment 1 Notify RN    Comment 2 Document in Chart   Glucose, capillary     Status: Abnormal   Collection Time: 08/25/18 10:01 PM  Result Value Ref Range   Glucose-Capillary 55 (L) 70 - 99 mg/dL  Glucose, capillary     Status: None   Collection Time: 08/25/18 10:53 PM  Result Value Ref Range   Glucose-Capillary 89 70 - 99 mg/dL  Glucose, capillary     Status: Abnormal   Collection Time: 08/26/18  6:54 AM  Result Value Ref Range    Glucose-Capillary 205 (H) 70 - 99 mg/dL  Glucose, capillary     Status: Abnormal   Collection Time: 08/26/18 11:21 AM  Result Value Ref Range   Glucose-Capillary 238 (H) 70 - 99 mg/dL   Comment 1 Notify RN    Comment 2 Document in Chart   Glucose, capillary     Status: Abnormal   Collection Time: 08/26/18  4:45 PM  Result Value Ref Range   Glucose-Capillary 201 (H) 70 - 99 mg/dL   Comment 1 Notify RN    Comment 2 Document in Chart     No results found.  Review of Systems  Constitutional: Positive for chills and fever.  HENT: Negative.   Eyes: Negative.   Respiratory: Negative for cough and shortness of breath.   Cardiovascular: Negative for chest pain, orthopnea, leg swelling and PND.  Gastrointestinal: Negative.   Genitourinary: Negative.   Musculoskeletal: Positive for neck pain.  Skin:       Thick callus skin over the medial aspect of the right great toe.  Neurological: Negative.   Endo/Heme/Allergies: Negative.   Psychiatric/Behavioral: Negative.    Blood pressure (!) 163/86, pulse 99, temperature 97.6 F (36.4 C), temperature source Oral, resp. rate 18, height 5\' 10"  (1.778 m), weight 97.5 kg, SpO2 99 %. Physical Exam  Constitutional: He is oriented to person, place, and time. He appears well-developed and well-nourished. No distress.  HENT:  Head: Normocephalic and atraumatic.  Mouth/Throat: Oropharynx is clear and moist.  Teeth appear to be in good condition  Eyes: Pupils are equal, round,  and reactive to light. Conjunctivae and EOM are normal.  Neck:  Swelling and induration of the left neck centered over the lower sternocleidomastoid muscle.  There is mild tenderness and mild erythema.  Cardiovascular: Normal rate, regular rhythm and normal heart sounds.  No murmur heard. Respiratory: Effort normal and breath sounds normal. No respiratory distress.  GI: Soft. Bowel sounds are normal. He exhibits no distension. There is no abdominal tenderness.  Musculoskeletal:         General: No edema.     Comments: The right foot is wrapped in a dressing.  There is a dry open wound along the base of the right great toe.  There is no significant cellulitis.  There is no drainage.  There is a dark pustule on the plantar aspect of the left foot without drainage.  Neurological: He is alert and oriented to person, place, and time.  Skin: Skin is warm and dry.  Psychiatric: He has a normal mood and affect.   CLINICAL DATA:  Fullness of the left neck and supraclavicular region. Symptoms began several days ago. Abnormal chest CT.  EXAM: CT NECK WITH CONTRAST  TECHNIQUE: Multidetector CT imaging of the neck was performed using the standard protocol following the bolus administration of intravenous contrast.  CONTRAST:  29mL OMNIPAQUE IOHEXOL 300 MG/ML  SOLN  COMPARISON:  None.  FINDINGS: Pharynx and larynx: No mucosal or submucosal lesion is seen.  Salivary glands: Parotid and submandibular glands are normal.  Thyroid: Normal  Lymph nodes: No adenopathy in the upper neck.  Vascular: Material in venous structures show flow. There appears to be extrinsic compression of the inferior aspect of the internal jugular vein on the left.  Limited intracranial: Normal  Visualized orbits: Normal  Mastoids and visualized paranasal sinuses: Clear  Skeleton: Ordinary cervical spondylosis. See below regarding possible left sternoclavicular infection.  Upper chest: See results of chest CT.  Other: As noted on the previous chest CT, there is an extensive inflammatory process in the left lower neck and upper chest. The epicenter of this appears to be in the region of the sternoclavicular joint and this could arise from a septic sternoclavicular arthritis. There is abscess formation in the low left neck, with extension into the superior mediastinum on the left. The abscess is multilocular and insinuates around multiple structures in the left low  neck and supraclavicular region as well as crossing the midline within the anterior low neck at the thoracic inlet region.  IMPRESSION: Extensive regional inflammatory process in the left low neck, supraclavicular region and with extension to the superior mediastinum consistent with phlegmonous inflammation and multilocular abscess formation. Most common cause in this region would be complication from septic arthritis of the left sternoclavicular joint, though that is not definite. See above for full discussion. Incision and debridement may be necessary.  Call report in progress.   Electronically Signed   By: Nelson Chimes M.D.   On: 08/23/2018 14:02   CLINICAL DATA:  Left upper chest discomfort  EXAM: CT CHEST WITH CONTRAST  TECHNIQUE: Multidetector CT imaging of the chest was performed during intravenous contrast administration.  CONTRAST:  56mL ISOVUE-300 IOPAMIDOL (ISOVUE-300) INJECTION 61%  COMPARISON:  Chest radiographs dated 05/22/2011  FINDINGS: Motion degraded images, limiting evaluation.  Cardiovascular: Heart is normal in size.  No pericardial effusion.  No evidence of thoracic aortic aneurysm.  Mediastinum/Nodes: No suspicious mediastinal lymphadenopathy.  Visualized thyroid is unremarkable.  Lungs/Pleura: Moderate bilateral pleural effusions.  Associated lower lobe compressive atelectasis.  Evaluation  of the lung parenchyma is constrained by respiratory motion. Within that constraint, there are no suspicious pulmonary nodules.  No focal consolidation or frank interstitial edema.  No pneumothorax.  Upper Abdomen: Visualized upper abdomen is grossly unremarkable.  Musculoskeletal: Subcutaneous gas in the left lower cervical/supraclavicular region (series 2/images 14, 17, and 20), poorly evaluated due to motion degradation. Overlying subcutaneous edema (series 2/image 13). This appearance suggests left  neck cellulitis/infection, abscess not excluded due to the limitations of the current study.  Mild body wall edema.  Mild degenerative changes of the thoracic spine.  IMPRESSION: Subcutaneous gas with edema in the left lower cervical/supraclavicular region, poorly evaluated due to motion degradation, but worrisome for left neck cellulitis/infection. Abscess cannot be excluded due to the poor quality of the study, given motion degradation. If further imaging is desired, consider CT neck with contrast. If possible, a right arm injection would be preferable.  Moderate bilateral pleural effusions. No frank interstitial edema. Mild body wall edema.   Electronically Signed   By: Julian Hy M.D.   On: 08/22/2018 21:57   Assessment/Plan:  This 53 year old diabetic patient presents with sepsis and DKA after trimming a thick callus on the right great toe.  He had group B strep in his blood and the likely source was infection of the right great toe.  He subsequently developed swelling, tenderness, and erythema centered over the left lower neck and sternoclavicular region.  I have personally reviewed his CT scan of the chest and neck.  There is an obvious abscess involving the left lower neck with fluid and gas present.  This extends down to the left internal jugular vein and carotid artery.  There is some extension down into the retromanubrial region which I would not necessarily consider mediastinal extension.  The tissue planes from the neck naturally extend down to this area.  There certainly could be involvement of the left sternoclavicular joint although on CT scan there is no sign of osteomyelitis and no significant swelling in the joint.  He has been thoroughly evaluated by ENT who does not feel that there is any oral pharyngeal source for his infection.  I suspect it is most likely due to bacteremia from his toe.  This will require surgical drainage and possibly treatment with a  wound VAC.  I discussed the operative procedure of incision and drainage of the abscess with him including the benefits and risk including but not limited to bleeding, blood transfusion, injury to neck structures, need for recurrent dressing changes and debridements, and the possibility that he may have an open wound that will take time to close on its own.  He understands all this and agrees to proceed.  We will schedule this for tomorrow afternoon.  I spent 60 minutes performing this consultation and > 50% of this time was spent face to face counseling and coordinating the care of this patient's left neck and chest wall abscess.  Gaye Pollack 08/26/2018, 6:34 PM

## 2018-08-26 NOTE — Consult Note (Signed)
[facility] for Infectious Disease       Reason for Consult: GBS septic arthritis    Referring Physician: Dr. Ladona Ridgel  Principal Problem:   AKI (acute kidney injury) [facility]) Active Problems:   Type 2 diabetes mellitus not at goal [facility])   HTN (hypertension), benign   Personal history of noncompliance with medical treatment, presenting hazards to health   Hyperlipidemia associated with type 2 diabetes mellitus ([place])   Hyponatremia   Diabetic hyperosmolar non-ketotic state ([place])   Hyperglycemia   Cellulitis of great toe, right   . amLODipine  10 mg Oral Daily  . atorvastatin  10 mg Oral Daily  . docusate sodium  100 mg Oral BID  . feeding supplement (PRO-STAT SUGAR FREE 64)  30 mL Oral BID  . heparin  5,000 Units Subcutaneous Q8H  . insulin aspart  0-15 Units Subcutaneous TID WC  . insulin aspart  0-5 Units Subcutaneous QHS  . insulin aspart  8 Units Subcutaneous TID WC  . insulin detemir  30 Units Subcutaneous Daily  . nystatin   Topical BID  . polyethylene glycol  17 g Oral Daily    Recommendations: Penicillin Will stop amp/sulbactam Thoracic surgery consultation/debridement as indicated Will need picc line vs central line for prolonged I V antibiotics -  Ideally would avoid picc line with his chronic renal disease but will defer to primary team as he also has an infection in the area of a central line Routine hepatitis C screening  Assessment: He has GBS bacteremia and multilocular abscess and likely septic arthritis of left sternoclavicular joint.   HIV negative  Antibiotics: Amp/sulbactam Day 10 total antibiotics  HPI: Randy Oneal is a 53 y.o. male with poorly controlled DM, HTN, chronic kidney disease who presented to [facility] with AMS and found to have GBS bacteremia.  Had pain in chest and neck area on left side and CT with gas and abscess, probable septic arthritis.  Sent here to [facility] for further surgical management.  Feels better but still  tender area.  Seen by Dr. Benjamine Mola and waiting for thoracic evaluation and plan for debridement.  Has remained afebrile > 3 days.  WBC has remained elevated, last 20.6.  Last Hgb A1C 13 this month.   Review of Systems:  Constitutional: negative for fevers and chills Gastrointestinal: negative for nausea and diarrhea Integument/breast: negative for rash All other systems reviewed and are negative    Past Medical History:  Diagnosis Date  . Diabetes mellitus    Insulin dependent  . Hypertension   . Renal insufficiency     Social History   Tobacco Use  . Smoking status: Never Smoker  . Smokeless tobacco: Never Used  Substance Use Topics  . Alcohol use: No  . Drug use: No    Family History  Problem Relation Age of Onset  . Hypertension Mother   . Colon cancer Neg Hx     No Known Allergies  Physical Exam: Constitutional: in no apparent distress  Vitals:   08/26/18 0651 08/26/18 1206  BP:  (!) 163/86  Pulse:  99  Resp:  18  Temp: 98.8 F (37.1 C) 97.6 F (36.4 C)  SpO2:  99%   EYES: anicteric ENMT: no thrush Cardiovascular: Cor RRR Respiratory: CTA B; normal respiratory effort GI: Bowel sounds are normal Musculoskeletal: no pedal edema noted Skin: negatives: no rash Neuro: non focal  Lab Results  Component Value Date   WBC 20.6 (H) 08/24/2018   HGB  9.8 (L) 08/24/2018   HCT 30.8 (L) 08/24/2018   MCV 80.6 08/24/2018   PLT 477 (H) 08/24/2018    Lab Results  Component Value Date   CREATININE 1.45 (H) 08/24/2018   BUN 26 (H) 08/24/2018   NA 133 (L) 08/24/2018   K 4.6 08/24/2018   CL 106 08/24/2018   CO2 21 (L) 08/24/2018    Lab Results  Component Value Date   ALT 41 08/18/2018   AST 71 (H) 08/18/2018   ALKPHOS 178 (H) 08/18/2018     Microbiology: Recent Results (from the past 240 hour(s))  Culture, blood (routine x 2)     Status: Abnormal   Collection Time: 08/17/18 10:55 AM  Result Value Ref Range Status   Specimen Description BLOOD RIGHT  ANTECUBITAL  Final   Special Requests   Final    BOTTLES DRAWN AEROBIC AND ANAEROBIC Blood Culture adequate volume   Culture  Setup Time   Final    IN BOTH AEROBIC AND ANAEROBIC BOTTLES GRAM POSITIVE COCCI IN CHAINS Gram Stain Report Called to,Read Back By and Verified With: PJ SEXTON,RN @0239  08/18/18 MKELLY Organism ID to follow    Culture GROUP B STREP(S.AGALACTIAE)ISOLATED (A)  Final   Report Status 08/20/2018 FINAL  Final   Organism ID, Bacteria GROUP B STREP(S.AGALACTIAE)ISOLATED  Final      Susceptibility   Group b strep(s.agalactiae)isolated - MIC*    CLINDAMYCIN <=0.25 SENSITIVE Sensitive     AMPICILLIN <=0.25 SENSITIVE Sensitive     ERYTHROMYCIN <=0.12 SENSITIVE Sensitive     VANCOMYCIN 0.5 SENSITIVE Sensitive     CEFTRIAXONE <=0.12 SENSITIVE Sensitive     LEVOFLOXACIN 4 INTERMEDIATE Intermediate     PENICILLIN Value in next row Sensitive      SENSITIVE<=0.12Performed at [facility], [place] [address] [place], [place] [address]    * GROUP B STREP(S.AGALACTIAE)ISOLATED  Blood Culture ID Panel (Reflexed)     Status: Abnormal   Collection Time: 08/17/18 10:55 AM  Result Value Ref Range Status   Enterococcus species NOT DETECTED NOT DETECTED Final   Listeria monocytogenes NOT DETECTED NOT DETECTED Final   Staphylococcus species NOT DETECTED NOT DETECTED Final   Staphylococcus aureus (BCID) NOT DETECTED NOT DETECTED Final   Streptococcus species DETECTED (A) NOT DETECTED Final    Comment: CRITICAL RESULT CALLED TO, READ BACK BY AND VERIFIED WITH: Forest Gleason PharmD 9:45 08/18/18 ([email or web address])    Streptococcus agalactiae DETECTED (A) NOT DETECTED Final    Comment: CRITICAL RESULT CALLED TO, READ BACK BY AND VERIFIED WITH: Forest Gleason PharmD 9:45 08/18/18 ([email or web address])    Streptococcus pneumoniae NOT DETECTED NOT DETECTED Final   Streptococcus pyogenes NOT DETECTED NOT DETECTED Final   Acinetobacter baumannii NOT DETECTED NOT DETECTED Final   Enterobacteriaceae species NOT DETECTED NOT  DETECTED Final   Enterobacter cloacae complex NOT DETECTED NOT DETECTED Final   Escherichia coli NOT DETECTED NOT DETECTED Final   Klebsiella oxytoca NOT DETECTED NOT DETECTED Final   Klebsiella pneumoniae NOT DETECTED NOT DETECTED Final   Proteus species NOT DETECTED NOT DETECTED Final   Serratia marcescens NOT DETECTED NOT DETECTED Final   Haemophilus influenzae NOT DETECTED NOT DETECTED Final   Neisseria meningitidis NOT DETECTED NOT DETECTED Final   Pseudomonas aeruginosa NOT DETECTED NOT DETECTED Final   Candida albicans NOT DETECTED NOT DETECTED Final   Candida glabrata NOT DETECTED NOT DETECTED Final   Candida krusei NOT DETECTED NOT DETECTED Final   Candida parapsilosis NOT DETECTED NOT DETECTED Final   Candida  tropicalis NOT DETECTED NOT DETECTED Final    Comment: Performed at [facility], [place] [address], [place], [place] [address]  Culture, blood (routine x 2)     Status: Abnormal   Collection Time: 08/17/18 11:04 AM  Result Value Ref Range Status   Specimen Description   Final    BLOOD LEFT HAND Performed at [facility], [address], [place], [place] [address]    Special Requests   Final    BOTTLES DRAWN AEROBIC ONLY Blood Culture results may not be optimal due to an inadequate volume of blood received in culture bottles Performed at [facility], [address] [place], [place] [address]    Culture  Setup Time   Final    AEROBIC BOTTLE ONLY GRAM POSITIVE COCCI IN CHAINS Gram Stain Report Called to,Read Back By and Verified With: PJ SEXTON,RN @0235  08/18/18 [facility] Performed at [facility], [address], [place], [place] [address]    Culture (A)  Final    GROUP B STREP(S.AGALACTIAE)ISOLATED SUSCEPTIBILITIES PERFORMED ON PREVIOUS CULTURE WITHIN THE LAST 5 DAYS. Performed at [facility], [place] [address], [place], [place] [address]    Report Status 08/20/2018 FINAL  Final  Culture, blood (Routine X 2) w Reflex to ID Panel     Status: None   Collection  Time: 08/19/18  6:58 PM  Result Value Ref Range Status   Specimen Description BLOOD RIGHT HAND  Final   Special Requests   Final    BOTTLES DRAWN AEROBIC AND ANAEROBIC Blood Culture adequate volume   Culture   Final    NO GROWTH 5 DAYS Performed at [facility], [address] [place], [place] [address]    Report Status 08/24/2018 FINAL  Final  Culture, blood (Routine X 2) w Reflex to ID Panel     Status: None   Collection Time: 08/19/18  7:07 PM  Result Value Ref Range Status   Specimen Description BLOOD LEFT HAND  Final   Special Requests   Final    BOTTLES DRAWN AEROBIC AND ANAEROBIC Blood Culture adequate volume   Culture   Final    NO GROWTH 5 DAYS Performed at [facility], [address] [place],  [address]    Report Status 08/24/2018 FINAL  Final    Thayer Headings, [place] for Infectious Disease [facility] [email or web address] [phone] pager  [phone] cell 08/26/2018, 3:47 PM

## 2018-08-26 NOTE — Progress Notes (Addendum)
PROGRESS NOTE    Randy Oneal  GXQ:[id number] DOB: 12-03-1965 DOA: 08/16/2018 PCP: [name], MD    Brief Narrative:  53 year old male who presented with uncontrolled hyperglycemia.  He does have significant past medical history for type 2 diabetes mellitus, hypertension and chronic kidney disease.  Patient was sent to the hospital by his primary care provider due to altered mentation.  He was not able to give any detailed history.  Per his wife he had significant polyuria and polydipsia, with visual hallucinations for the last 2 days.  On his initial physical examination temperature 98.6, heart rate 114, respirate 18, blood pressure 136/104, oxygen saturation 99%.  Dry mucous membranes, heart S1-S2 present, tachycardic, lungs clear to auscultation bilaterally, abdomen soft nontender, no lower extremity edema.  Positive right foot diabetic ulcer.  Patient was disoriented and weak.  Sodium 121, potassium 4.1, chloride 82, bicarb 24, glucose 432, BUN 67, creatinine 2.7, 0.8, hemoglobin 14.1, hematocrit 43.0, platelets 144.   Patient was admitted to the hospital working diagnosis of hyperosmolar nonketotic state, duplicated by right foot diabetic ulcer.    Assessment & Plan:   Principal Problem:   AKI (acute kidney injury) ([place]) Active Problems:   Type 2 diabetes mellitus not at goal [facility])   HTN (hypertension), benign   Personal history of noncompliance with medical treatment, presenting hazards to health   Hyperlipidemia associated with type 2 diabetes mellitus ([place])   Hyponatremia   Diabetic hyperosmolar non-ketotic state ([place])   Hyperglycemia   Cellulitis of great toe, right   1. Sepsis due to supraclavicular region multilocular abscess formation, due to strep B bacteremia, (present on admission) On IV antibiotic therapy with unasyn. Continue pain control. CT surgery has been consulted per ENT recommendations. Will continue to follow on cell count and temperature curve.  Clinically left upper chest looks larger than yesterday, will likely need drainage. Follow with ID recommendations.   2. T2dm with uncontrolled hyperglycemia. Glucose control with insulin levimir 30 units, plus 8 units of aspart before meals, along with insulin sliding scale. Fasting glucose today 205.   3. HTN. On amlodipine for blood pressure control, continue with as needed labetalol.   4. AKI on CKD stage 3, with hypokalemia and hyponatremia. Will follow on renal panel in am. Will continue to avoid nephrotoxic medications or hypotension. Patient tolerating po well. Continue saline at 75 ml per H.   5. Dyslipidemia. Continue statin therapy.   6. Obesity. Calculated BMI 30, will need outpatient follow up.   Body mass index is 30.84 kg/m. Malnutrition Type:  Nutrition Problem: Food and nutrition related knowledge deficit Etiology: limited prior education, wound healing   Malnutrition Characteristics:  Signs/Symptoms: per patient/family report   Nutrition Interventions:  Interventions: Education, MVI, Snacks  RN Pressure Injury Documentation:     Consultants:     Procedures:     Antimicrobials:       Subjective: Patient continue to have pain at the anterior left base of the neck, worse to touch and movement. No nausea or vomiting, no chest pain or dyspnea.   Objective: Vitals:   08/25/18 1300 08/25/18 2024 08/26/18 0651 08/26/18 1206  BP: 133/81 (!) 154/81  (!) 163/86  Pulse: (!) 108 (!) 105  99  Resp: 13 (!) 24  18  Temp: 98.5 F (36.9 C) 98.5 F (36.9 C) 98.8 F (37.1 C) 97.6 F (36.4 C)  TempSrc: Oral Oral Oral Oral  SpO2: 100% 100%  99%  Weight:  Height:        Intake/Output Summary (Last 24 hours) at 08/26/2018 1521 Last data filed at 08/26/2018 1208 Gross per 24 hour  Intake 600 ml  Output -  Net 600 ml   Filed Weights   08/16/18 1502 08/25/18 0604  Weight: 82.6 kg 97.5 kg    Examination:   General: deconditioned  Neurology:  Awake and alert, non focal  E ENT: no pallor, no icterus, oral mucosa moist Cardiovascular: No JVD. S1-S2 present, rhythmic, no gallops, rubs, or murmurs. No lower extremity edema. Pulmonary: positive breath sounds bilaterally, adequate air movement, no wheezing, rhonchi or rales. Gastrointestinal. Abdomen with no organomegaly, non tender, no rebound or guarding Skin. Left anterior base of the neck with large induration, tender to palpation, positive discoloration, increased local temperature. No drainage. Firm.  Musculoskeletal: no joint deformities     Data Reviewed: I have personally reviewed following labs and imaging studies  CBC: Recent Labs  Lab 08/21/18 0601 08/22/18 0613 08/24/18 0839  WBC 21.1* 20.8* 20.6*  HGB 10.5* 11.1* 9.8*  HCT 32.4* 34.4* 30.8*  MCV 78.3* 79.4* 80.6  PLT 303 386 440*   Basic Metabolic Panel: Recent Labs  Lab 08/21/18 0601 08/22/18 0613 08/23/18 1010 08/24/18 0839  NA 129* 129* 127* 133*  K 4.2 4.4 5.0 4.6  CL 100 100 100 106  CO2 22 21* 20* 21*  GLUCOSE 233* 168* 283* 110*  BUN 30* 28* 26* 26*  CREATININE 1.56* 1.54* 1.54* 1.45*  CALCIUM 7.1* 7.3* 7.3* 7.6*   GFR: Estimated Creatinine Clearance: 69.8 mL/min (A) (by C-G formula based on SCr of 1.45 mg/dL (H)). Liver Function Tests: No results for input(s): AST, ALT, ALKPHOS, BILITOT, PROT, ALBUMIN in the last 168 hours. No results for input(s): LIPASE, AMYLASE in the last 168 hours. No results for input(s): AMMONIA in the last 168 hours. Coagulation Profile: No results for input(s): INR, PROTIME in the last 168 hours. Cardiac Enzymes: No results for input(s): CKTOTAL, CKMB, CKMBINDEX, TROPONINI in the last 168 hours. BNP (last 3 results) No results for input(s): PROBNP in the last 8760 hours. HbA1C: No results for input(s): HGBA1C in the last 72 hours. CBG: Recent Labs  Lab 08/25/18 1611 08/25/18 2201 08/25/18 2253 08/26/18 0654 08/26/18 1121  GLUCAP 117* 55* 89 205* 238*     Lipid Profile: No results for input(s): CHOL, HDL, LDLCALC, TRIG, CHOLHDL, LDLDIRECT in the last 72 hours. Thyroid Function Tests: No results for input(s): TSH, T4TOTAL, FREET4, T3FREE, THYROIDAB in the last 72 hours. Anemia Panel: No results for input(s): VITAMINB12, FOLATE, FERRITIN, TIBC, IRON, RETICCTPCT in the last 72 hours.    Radiology Studies: I have reviewed all of the imaging during this hospital visit personally     Scheduled Meds: . amLODipine  10 mg Oral Daily  . atorvastatin  10 mg Oral Daily  . docusate sodium  100 mg Oral BID  . feeding supplement (PRO-STAT SUGAR FREE 64)  30 mL Oral BID  . heparin  5,000 Units Subcutaneous Q8H  . insulin aspart  0-15 Units Subcutaneous TID WC  . insulin aspart  0-5 Units Subcutaneous QHS  . insulin aspart  8 Units Subcutaneous TID WC  . insulin detemir  30 Units Subcutaneous Daily  . nystatin   Topical BID  . polyethylene glycol  17 g Oral Daily   Continuous Infusions: . sodium chloride Stopped (08/18/18 0947)  . 0.9 % NaCl with KCl 20 mEq / L 75 mL/hr at 08/26/18 1022  . ampicillin-sulbactam (  UNASYN) IV 3 g (08/26/18 1024)     LOS: 9 days         Gerome Apley, MD [facility] Pager [phone]

## 2018-08-27 ENCOUNTER — Encounter (HOSPITAL_COMMUNITY): Payer: Self-pay | Admitting: *Deleted

## 2018-08-27 ENCOUNTER — Inpatient Hospital Stay (HOSPITAL_COMMUNITY): Payer: Managed Care, Other (non HMO) | Admitting: Certified Registered"

## 2018-08-27 ENCOUNTER — Encounter (HOSPITAL_COMMUNITY)
Admission: EM | Disposition: A | Discharge status: Home Health | Payer: Self-pay | Source: Home / Self Care | Attending: Surgery

## 2018-08-27 DIAGNOSIS — L0211 Cutaneous abscess of neck: Secondary | ICD-10-CM

## 2018-08-27 DIAGNOSIS — J853 Abscess of mediastinum: Secondary | ICD-10-CM | POA: Diagnosis present

## 2018-08-27 DIAGNOSIS — E785 Hyperlipidemia, unspecified: Secondary | ICD-10-CM

## 2018-08-27 DIAGNOSIS — E1169 Type 2 diabetes mellitus with other specified complication: Secondary | ICD-10-CM

## 2018-08-27 HISTORY — PX: STERNAL WOUND DEBRIDEMENT: SHX1058

## 2018-08-27 HISTORY — PX: APPLICATION OF WOUND VAC: SHX5189

## 2018-08-27 LAB — BASIC METABOLIC PANEL
Anion gap: 6 (ref 5–15)
BUN: 24 mg/dL — AB (ref 6–20)
CO2: 22 mmol/L (ref 22–32)
Calcium: 7.8 mg/dL — ABNORMAL LOW (ref 8.9–10.3)
Chloride: 108 mmol/L (ref 98–111)
Creatinine, Ser: 1.73 mg/dL — ABNORMAL HIGH (ref 0.61–1.24)
GFR calc Af Amer: 51 mL/min — ABNORMAL LOW (ref >60)
GFR calc non Af Amer: 44 mL/min — ABNORMAL LOW (ref >60)
Glucose, Bld: 156 mg/dL — ABNORMAL HIGH (ref 70–99)
Potassium: 4.6 mmol/L (ref 3.5–5.1)
Sodium: 136 mmol/L (ref 135–145)

## 2018-08-27 LAB — CBC WITH DIFFERENTIAL/PLATELET
Abs Immature Granulocytes: 0.12 10*3/uL — ABNORMAL HIGH (ref 0.00–0.07)
Basophils Absolute: 0 10*3/uL (ref 0.0–0.1)
Basophils Relative: 0 %
Eosinophils Absolute: 0.1 10*3/uL (ref 0.0–0.5)
Eosinophils Relative: 0 %
HCT: 28.3 % — ABNORMAL LOW (ref 39.0–52.0)
Hemoglobin: 8.8 g/dL — ABNORMAL LOW (ref 13.0–17.0)
Immature Granulocytes: 1 %
Lymphocytes Relative: 8 %
Lymphs Abs: 1.2 10*3/uL (ref 0.7–4.0)
MCH: 24.9 pg — AB (ref 26.0–34.0)
MCHC: 31.1 g/dL (ref 30.0–36.0)
MCV: 80.2 fL (ref 80.0–100.0)
Monocytes Absolute: 0.7 10*3/uL (ref 0.1–1.0)
Monocytes Relative: 5 %
NEUTROS PCT: 86 %
Neutro Abs: 12.2 10*3/uL — ABNORMAL HIGH (ref 1.7–7.7)
Platelets: 602 10*3/uL — ABNORMAL HIGH (ref 150–400)
RBC: 3.53 MIL/uL — ABNORMAL LOW (ref 4.22–5.81)
RDW: 14.6 % (ref 11.5–15.5)
WBC: 14.3 10*3/uL — AB (ref 4.0–10.5)
nRBC: 0 % (ref 0.0–0.2)

## 2018-08-27 LAB — GLUCOSE, CAPILLARY
Glucose-Capillary: 126 mg/dL — ABNORMAL HIGH (ref 70–99)
Glucose-Capillary: 124 mg/dL — ABNORMAL HIGH (ref 70–99)
Glucose-Capillary: 137 mg/dL — ABNORMAL HIGH (ref 70–99)
Glucose-Capillary: 168 mg/dL — ABNORMAL HIGH (ref 70–99)

## 2018-08-27 SURGERY — DEBRIDEMENT, WOUND, STERNUM
Anesthesia: General | Site: Neck | Laterality: Left

## 2018-08-27 MED ORDER — PROPOFOL 10 MG/ML IV BOLUS
INTRAVENOUS | Status: DC | PRN
  Administered 2018-08-27: 130 mg via INTRAVENOUS

## 2018-08-27 MED ORDER — ACETAMINOPHEN 10 MG/ML IV SOLN
1000.0000 mg | Freq: Once | INTRAVENOUS | Status: DC | PRN
  Administered 2018-08-27: 1000 mg via INTRAVENOUS

## 2018-08-27 MED ORDER — FENTANYL CITRATE (PF) 100 MCG/2ML IJ SOLN
INTRAMUSCULAR | Status: AC
  Filled 2018-08-27: qty 2

## 2018-08-27 MED ORDER — LACTATED RINGERS IV SOLN: INTRAVENOUS | Status: DC

## 2018-08-27 MED ORDER — LIDOCAINE 2% (20 MG/ML) 5 ML SYRINGE
INTRAMUSCULAR | Status: DC | PRN
  Administered 2018-08-27: 60 mg via INTRAVENOUS

## 2018-08-27 MED ORDER — PENICILLIN G POTASSIUM 20000000 UNITS IJ SOLR
4.0000 10*6.[IU] | INTRAVENOUS | Status: DC
  Administered 2018-08-27 – 2018-09-05 (×54): 4 10*6.[IU] via INTRAVENOUS
  Filled 2018-08-27 (×63): qty 4

## 2018-08-27 MED ORDER — OXYCODONE HCL 5 MG PO TABS
10.0000 mg | ORAL_TABLET | ORAL | Status: DC | PRN
  Administered 2018-08-27 – 2018-09-05 (×4): 10 mg via ORAL
  Filled 2018-08-27 (×4): qty 2

## 2018-08-27 MED ORDER — ACETAMINOPHEN 10 MG/ML IV SOLN
INTRAVENOUS | Status: AC
  Filled 2018-08-27: qty 100

## 2018-08-27 MED ORDER — SUCCINYLCHOLINE CHLORIDE 200 MG/10ML IV SOSY
PREFILLED_SYRINGE | INTRAVENOUS | Status: DC | PRN
  Administered 2018-08-27: 90 mg via INTRAVENOUS

## 2018-08-27 MED ORDER — FENTANYL CITRATE (PF) 250 MCG/5ML IJ SOLN
INTRAMUSCULAR | Status: AC
  Filled 2018-08-27: qty 5

## 2018-08-27 MED ORDER — ALBUMIN HUMAN 5 % IV SOLN
INTRAVENOUS | Status: DC | PRN
  Administered 2018-08-27: 16:00:00 via INTRAVENOUS

## 2018-08-27 MED ORDER — OXYCODONE HCL 5 MG PO TABS: 5.0000 mg | ORAL_TABLET | Freq: Once | ORAL | Status: DC | PRN

## 2018-08-27 MED ORDER — PHENYLEPHRINE HCL 10 MG/ML IJ SOLN
INTRAMUSCULAR | Status: DC | PRN
  Administered 2018-08-27 (×3): 80 ug via INTRAVENOUS

## 2018-08-27 MED ORDER — LIDOCAINE 2% (20 MG/ML) 5 ML SYRINGE
INTRAMUSCULAR | Status: AC
  Filled 2018-08-27: qty 5

## 2018-08-27 MED ORDER — ACETAMINOPHEN 160 MG/5ML PO SOLN: 1000.0000 mg | Freq: Once | ORAL | Status: DC | PRN

## 2018-08-27 MED ORDER — ONDANSETRON HCL 4 MG/2ML IJ SOLN
INTRAMUSCULAR | Status: AC
  Filled 2018-08-27: qty 2

## 2018-08-27 MED ORDER — ONDANSETRON HCL 4 MG/2ML IJ SOLN
INTRAMUSCULAR | Status: DC | PRN
  Administered 2018-08-27: 4 mg via INTRAVENOUS

## 2018-08-27 MED ORDER — LABETALOL HCL 5 MG/ML IV SOLN
INTRAVENOUS | Status: AC
  Filled 2018-08-27: qty 4

## 2018-08-27 MED ORDER — OXYCODONE HCL 5 MG/5ML PO SOLN: 5.0000 mg | Freq: Once | ORAL | Status: DC | PRN

## 2018-08-27 MED ORDER — ACETAMINOPHEN 500 MG PO TABS: 1000.0000 mg | ORAL_TABLET | Freq: Once | ORAL | Status: DC | PRN

## 2018-08-27 MED ORDER — SODIUM CHLORIDE 0.9 % IV SOLN
INTRAVENOUS | Status: DC | PRN
  Administered 2018-08-27: 30 ug/min via INTRAVENOUS

## 2018-08-27 MED ORDER — FENTANYL CITRATE (PF) 250 MCG/5ML IJ SOLN
INTRAMUSCULAR | Status: DC | PRN
  Administered 2018-08-27 (×5): 50 ug via INTRAVENOUS

## 2018-08-27 MED ORDER — LACTATED RINGERS IV SOLN
INTRAVENOUS | Status: DC
  Administered 2018-08-27 (×2): via INTRAVENOUS

## 2018-08-27 MED ORDER — MIDAZOLAM HCL 2 MG/2ML IJ SOLN
INTRAMUSCULAR | Status: AC
  Filled 2018-08-27: qty 2

## 2018-08-27 MED ORDER — FENTANYL CITRATE (PF) 100 MCG/2ML IJ SOLN
25.0000 ug | INTRAMUSCULAR | Status: DC | PRN
  Administered 2018-08-27 (×2): 50 ug via INTRAVENOUS

## 2018-08-27 MED ORDER — 0.9 % SODIUM CHLORIDE (POUR BTL) OPTIME
TOPICAL | Status: DC | PRN
  Administered 2018-08-27: 1000 mL

## 2018-08-27 MED ORDER — SODIUM CHLORIDE 0.9 % IV SOLN
INTRAVENOUS | Status: DC
  Administered 2018-08-27 – 2018-09-03 (×4): via INTRAVENOUS

## 2018-08-27 MED ORDER — PROPOFOL 10 MG/ML IV BOLUS
INTRAVENOUS | Status: AC
  Filled 2018-08-27: qty 20

## 2018-08-27 MED ORDER — FENTANYL CITRATE (PF) 100 MCG/2ML IJ SOLN
50.0000 ug | INTRAMUSCULAR | Status: DC | PRN
  Administered 2018-09-01: 50 ug via INTRAVENOUS
  Filled 2018-08-27: qty 2

## 2018-08-27 MED ORDER — MIDAZOLAM HCL 2 MG/2ML IJ SOLN
INTRAMUSCULAR | Status: DC | PRN
  Administered 2018-08-27: 2 mg via INTRAVENOUS

## 2018-08-27 MED ORDER — HEMOSTATIC AGENTS (NO CHARGE) OPTIME
TOPICAL | Status: DC | PRN
  Administered 2018-08-27: 1 via TOPICAL

## 2018-08-27 MED ORDER — LABETALOL HCL 5 MG/ML IV SOLN
5.0000 mg | INTRAVENOUS | Status: DC | PRN
  Administered 2018-08-27: 5 mg via INTRAVENOUS

## 2018-08-27 SURGICAL SUPPLY — 57 items
ATTRACTOMAT 16X20 MAGNETIC DRP (DRAPES) ×1 IMPLANT
BAG DECANTER FOR FLEXI CONT (MISCELLANEOUS) ×3 IMPLANT
BNDG GAUZE ELAST 4 BULKY (GAUZE/BANDAGES/DRESSINGS) IMPLANT
CANISTER SUCT 3000ML PPV (MISCELLANEOUS) ×3 IMPLANT
CANISTER WOUNDNEG PRESSURE 500 (CANNISTER) ×2 IMPLANT
CATH THORACIC 28FR RT ANG (CATHETERS) IMPLANT
CATH THORACIC 36FR (CATHETERS) IMPLANT
CATH THORACIC 36FR RT ANG (CATHETERS) IMPLANT
CLIP VESOCCLUDE MED 6/CT (CLIP) ×2 IMPLANT
CLIP VESOCCLUDE SM WIDE 24/CT (CLIP) IMPLANT
CLIP VESOCCLUDE SM WIDE 6/CT (CLIP) ×2 IMPLANT
CONT SPEC 4OZ CLIKSEAL STRL BL (MISCELLANEOUS) IMPLANT
COVER SURGICAL LIGHT HANDLE (MISCELLANEOUS) ×4 IMPLANT
COVER WAND RF STERILE (DRAPES) ×1 IMPLANT
DRAPE LAPAROSCOPIC ABDOMINAL (DRAPES) ×3 IMPLANT
DRSG PAD ABDOMINAL 8X10 ST (GAUZE/BANDAGES/DRESSINGS) ×3 IMPLANT
DRSG VAC ATS SM SENSATRAC (GAUZE/BANDAGES/DRESSINGS) ×2 IMPLANT
ELECT CAUTERY BLADE 6.4 (BLADE) ×2 IMPLANT
ELECT REM PT RETURN 9FT ADLT (ELECTROSURGICAL) ×3
ELECTRODE REM PT RTRN 9FT ADLT (ELECTROSURGICAL) ×1 IMPLANT
GAUZE SPONGE 4X4 12PLY STRL (GAUZE/BANDAGES/DRESSINGS) ×1 IMPLANT
GAUZE XEROFORM 5X9 LF (GAUZE/BANDAGES/DRESSINGS) IMPLANT
GLOVE EUDERMIC 7 POWDERFREE (GLOVE) ×6 IMPLANT
GOWN STRL REUS W/ TWL LRG LVL3 (GOWN DISPOSABLE) ×4 IMPLANT
GOWN STRL REUS W/ TWL XL LVL3 (GOWN DISPOSABLE) ×1 IMPLANT
GOWN STRL REUS W/TWL LRG LVL3 (GOWN DISPOSABLE) ×12
GOWN STRL REUS W/TWL XL LVL3 (GOWN DISPOSABLE) ×3
HANDPIECE INTERPULSE COAX TIP (DISPOSABLE)
HEMOSTAT SURGICEL 2X14 (HEMOSTASIS) ×2 IMPLANT
KIT BASIN OR (CUSTOM PROCEDURE TRAY) ×3 IMPLANT
KIT SUCTION CATH 14FR (SUCTIONS) IMPLANT
KIT TURNOVER KIT B (KITS) ×3 IMPLANT
MARKER SKIN DUAL TIP RULER LAB (MISCELLANEOUS) ×2 IMPLANT
NS IRRIG 1000ML POUR BTL (IV SOLUTION) ×3 IMPLANT
PACK CHEST (CUSTOM PROCEDURE TRAY) ×3 IMPLANT
PAD ARMBOARD 7.5X6 YLW CONV (MISCELLANEOUS) ×6 IMPLANT
PIN SAFETY STERILE (MISCELLANEOUS) IMPLANT
RUBBERBAND STERILE (MISCELLANEOUS) IMPLANT
SET HNDPC FAN SPRY TIP SCT (DISPOSABLE) IMPLANT
SPONGE LAP 18X18 X RAY DECT (DISPOSABLE) ×1 IMPLANT
STAPLER VISISTAT 35W (STAPLE) IMPLANT
STRAP MONTGOMERY 1.25X11-1/8 (MISCELLANEOUS) IMPLANT
SUT ETHILON 3 0 FSL (SUTURE) IMPLANT
SUT SILK 3 0 (SUTURE) ×3
SUT SILK 3-0 18XBRD TIE 12 (SUTURE) IMPLANT
SUT STEEL 6MS V (SUTURE) IMPLANT
SUT STEEL STERNAL CCS#1 18IN (SUTURE) IMPLANT
SUT STEEL SZ 6 DBL 3X14 BALL (SUTURE) IMPLANT
SUT VIC AB 1 CTX 36 (SUTURE)
SUT VIC AB 1 CTX36XBRD ANBCTR (SUTURE) ×2 IMPLANT
SWAB COLLECTION DEVICE MRSA (MISCELLANEOUS) ×4 IMPLANT
SWAB CULTURE ESWAB REG 1ML (MISCELLANEOUS) ×4 IMPLANT
SYR 5ML LL (SYRINGE) IMPLANT
TOWEL GREEN STERILE (TOWEL DISPOSABLE) ×3 IMPLANT
TOWEL GREEN STERILE FF (TOWEL DISPOSABLE) ×3 IMPLANT
TRAY FOLEY MTR SLVR 14FR STAT (SET/KITS/TRAYS/PACK) IMPLANT
WATER STERILE IRR 1000ML POUR (IV SOLUTION) ×1 IMPLANT

## 2018-08-27 NOTE — Brief Op Note (Signed)
08/27/2018  5:12 PM  PATIENT:  Randy Oneal  53 y.o. male  PRE-OPERATIVE DIAGNOSIS:  Left Chest, Neck, and Mediastinal Infection  POST-OPERATIVE DIAGNOSIS:  Left Chest, Neck, and Mediastinal Infection  PROCEDURE:  Procedure(s): Incision and DEBRIDEMENT Left Chest, Neck and Mediastinum (Left) APPLICATION OF WOUND VAC, left neck (Left)  SURGEON:  Surgeon(s) and Role:    * [place], Fernande Boyden, MD - Primary  PHYSICIAN ASSISTANT: none   ANESTHESIA:   general  EBL:  50 mL   BLOOD ADMINISTERED:none  DRAINS: wound vac   LOCAL MEDICATIONS USED:  NONE  SPECIMEN:  Source of Specimen:  wound cultures  DISPOSITION OF SPECIMEN:  micro  COUNTS:  YES  TOURNIQUET:  * No tourniquets in log *  DICTATION: .Note written in EPIC  PLAN OF CARE: Admit to inpatient   PATIENT DISPOSITION:  PACU - hemodynamically stable.   Delay start of Pharmacological VTE agent (>24hrs) due to surgical blood loss or risk of bleeding: yes

## 2018-08-27 NOTE — Transfer of Care (Signed)
Immediate Anesthesia Transfer of Care Note  Patient: Randy Oneal  Procedure(s) Performed: Incision and DEBRIDEMENT Left Chest, Neck and Mediastinum (Left Neck) APPLICATION OF WOUND VAC, left neck (Left Neck)  Patient Location: PACU  Anesthesia Type:General  Level of Consciousness: awake, alert , oriented and patient cooperative  Airway & Oxygen Therapy: Patient Spontanous Breathing and Patient connected to nasal cannula oxygen  Post-op Assessment: Report given to RN, Post -op Vital signs reviewed and stable and Patient moving all extremities  Post vital signs: Reviewed and stable  Last Vitals:  Vitals Value Taken Time  BP 165/88 08/27/2018  4:51 PM  Temp    Pulse 98 08/27/2018  4:55 PM  Resp 15 08/27/2018  4:55 PM  SpO2 88 % 08/27/2018  4:55 PM  Vitals shown include unvalidated device data.  Last Pain:  Vitals:   08/27/18 0800  TempSrc:   PainSc: Asleep      Patients Stated Pain Goal: 0 (16/57/90 3833)  Complications: No apparent anesthesia complications

## 2018-08-27 NOTE — Progress Notes (Signed)
Subjective: Pt resting comfortably in bed. No new complaint.  Objective: Vital signs in last 24 hours: Temp:  [98.4 F (36.9 C)-99.4 F (37.4 C)] 99.4 F (37.4 C) (01/17 0650) Pulse Rate:  [104-107] 107 (01/17 0650) Resp:  [16-18] 16 (01/17 0650) BP: (157-164)/(76-81) 157/76 (01/17 0650) SpO2:  [96 %-98 %] 96 % (01/17 0650) Weight:  [100.4 kg] 100.4 kg (01/17 1321)  Physical Exam: Head: Normocephalic, atraumatic Eyes: His pupils are equal, round, reactive to light. Extraocular motion is intact.  Ears: Examination of the ears shows normal auricles and external auditory canals bilaterally.  Nose: Nasal examination shows normal mucosa, septum, turbinates.  Face: Facial examination shows no asymmetry. Palpation of the face elicit no significant tenderness.  Mouth: Oral cavity examination shows no mucosalabnormality. No significant trismus is noted.  Neck/chest:Palpation of the neck/chest reveals significant induration over the left sternoclavicular joint, spreading to the upper chest and left lower neck. Trachea is midline. Chest: Normal respiratory rhythm. No stridor or SOB.  Recent Labs    08/27/18 0253  WBC 14.3*  HGB 8.8*  HCT 28.3*  PLT 602*   Recent Labs    08/27/18 0253  NA 136  K 4.6  CL 108  CO2 22  GLUCOSE 156*  BUN 24*  CREATININE 1.73*  CALCIUM 7.8*    Medications:  I have reviewed the patient's current medications. Scheduled: . [MAR Hold] amLODipine  10 mg Oral Daily  . [MAR Hold] atorvastatin  10 mg Oral Daily  . [MAR Hold] docusate sodium  100 mg Oral BID  . [MAR Hold] feeding supplement (PRO-STAT SUGAR FREE 64)  30 mL Oral BID  . [MAR Hold] heparin  5,000 Units Subcutaneous Q8H  . [MAR Hold] insulin aspart  0-15 Units Subcutaneous TID WC  . [MAR Hold] insulin aspart  0-5 Units Subcutaneous QHS  . [MAR Hold] insulin aspart  8 Units Subcutaneous TID WC  . [MAR Hold] insulin detemir  30 Units Subcutaneous Daily  . [MAR Hold] nystatin   Topical  BID  . [MAR Hold] polyethylene glycol  17 g Oral Daily   Continuous: . [MAR Hold] sodium chloride Stopped (08/18/18 0947)  . 0.9 % NaCl with KCl 20 mEq / L 75 mL/hr at 08/26/18 2322  . lactated ringers    . lactated ringers 10 mL/hr at 08/27/18 1330  . [MAR Hold] pencillin G potassium IV 4 Million Units (08/27/18 1054)    Assessment/Plan: Septicsternoclavicular arthritis, with the infection spreading to the lower neck and upper chest.  - IV abx as per ID recommendation. - Pt will be taken to the OR by Dr. Cyndia Bent for drainage and debridement. - Will follow.   LOS: 10 days   Janesa Dockery W Axell Trigueros 08/27/2018, 4:27 PM

## 2018-08-27 NOTE — Anesthesia Preprocedure Evaluation (Signed)
Anesthesia Evaluation  Patient identified by MRN, date of birth, ID band Patient awake    Reviewed: Allergy & Precautions, NPO status , Patient's Chart, lab work & pertinent test results  History of Anesthesia Complications Negative for: history of anesthetic complications  Airway Mallampati: III  TM Distance: >3 FB Neck ROM: Full    Dental  (+) Teeth Intact   Pulmonary neg pulmonary ROS,    breath sounds clear to auscultation       Cardiovascular hypertension, Pt. on medications (-) angina(-) Past MI and (-) CHF  Rhythm:Regular     Neuro/Psych negative neurological ROS  negative psych ROS   GI/Hepatic negative GI ROS, Neg liver ROS,   Endo/Other  diabetes, Type 2, Insulin Dependent  Renal/GU Renal disease     Musculoskeletal   Abdominal   Peds  Hematology  (+) anemia ,   Anesthesia Other Findings   Reproductive/Obstetrics                             Anesthesia Physical Anesthesia Plan  ASA: III  Anesthesia Plan: General   Post-op Pain Management:    Induction: Intravenous  PONV Risk Score and Plan: 2 and Ondansetron and Midazolam  Airway Management Planned: LMA and Oral ETT  Additional Equipment: None  Intra-op Plan:   Post-operative Plan: Extubation in OR  Informed Consent: I have reviewed the patients History and Physical, chart, labs and discussed the procedure including the risks, benefits and alternatives for the proposed anesthesia with the patient or authorized representative who has indicated his/her understanding and acceptance.     Dental advisory given  Plan Discussed with: CRNA and Surgeon  Anesthesia Plan Comments:         Anesthesia Quick Evaluation

## 2018-08-27 NOTE — Progress Notes (Signed)
Patient ID: Randy Oneal, male   DOB: [birth year], 53 y.o.   MRN: [id number] TCTS Evening Rounds:  Hemodynamically stable postop Comfortable and alert. Taking po VAC system working normally.  Minimal drainage.

## 2018-08-27 NOTE — Op Note (Signed)
CARDIOVASCULAR SURGERY OPERATIVE NOTE  08/27/2018  Surgeon:  Gaye Pollack, MD  First Assistant: none   Preoperative Diagnosis:  Left neck, chest wall and mediastinal abscess  Postoperative Diagnosis: Left sternoclavicular joint infection with extension of the abscess into the left neck and mediastinum   Procedure:  1.  Incision and drainage and debridement of left sternoclavicular joint, left neck, and mediastinum   Anesthesia:  General Endotracheal   Clinical History/Surgical Indication:  The patient is a 53 year old gentleman with history of hypertension and diabetes who said that he had been trimming his own callused skin over the medial aspect of the right great toe and then presented to [facility] on 08/17/2018 with DKA, confusion, acute kidney injury with a creatinine of 2.27, hyponatremia, and signs of sepsis.  He was noted to have some cellulitis and drainage from the right great toe.  He was also noted to have a plantar pustule on the left foot.  He was seen by Dr. Arnoldo Morale from general surgery and was not felt to have any drainable abscess no osteomyelitis.  No surgical intervention was warranted.  Blood cultures grew group B strep.  He was treated with antibiotics for sepsis.  He says that earlier this week he was noted to have some swelling and tenderness over the left lower neck and sternoclavicular region.  A CT scan of the neck and chest showed an abscess in the lower neck with fluid and air present extending slightly behind the upper part of the manubrium.  The sternoclavicular joint was intact without any signs of osteomyelitis.  He was seen by ENT and there is not felt to be any source in the neck or oropharynx. The patient was transferred to [facility] for further care.   I have personally reviewed his CT scan of the chest and neck.  There is an obvious abscess involving  the left lower neck with fluid and gas present.  This extends down to the left internal jugular vein and carotid artery.  There is some extension down into the retromanubrial region which I would not necessarily consider mediastinal extension.  The tissue planes from the neck naturally extend down to this area.  There certainly could be involvement of the left sternoclavicular joint although on CT scan there is no sign of osteomyelitis and no significant swelling in the joint.  He has been thoroughly evaluated by ENT who does not feel that there is any oral pharyngeal source for his infection.  I suspect it is most likely due to bacteremia from his toe.  This will require surgical drainage and possibly treatment with a wound VAC.  I discussed the operative procedure of incision and drainage of the abscess with him including the benefits and risk including but not limited to bleeding, blood transfusion, injury to neck structures, need for recurrent dressing changes and debridements, and the possibility that he may have an open wound that will take time to close on its own.  He understands all this and agrees to proceed.   Preparation:  The patient was seen in the preoperative holding area and the correct patient, correct operation were confirmed with the patient after reviewing the medical record. The consent was signed by me.  He was already on intravenous antibiotics for group B strep bacteremia.  The patient was taken back to the operating room and positioned supine on the operating room table. After being placed under general endotracheal anesthesia by the anesthesia team the neck and chest were  prepped with betadine soap and solution and draped in the usual sterile manner. A surgical time-out was taken and the correct patient and operative procedure were confirmed with the nursing and anesthesia staff.   Incision and drainage, debridement:  A curvilinear incision was made from the sternal notch over the  left sternoclavicular joint to a point lateral to the left sternocleidomastoid muscle along the natural skin lines.  This was over the area of maximum swelling and induration.  The incision was carried down through the subcutaneous tissue and platysma muscle using electrocautery and pus was encountered in the subcutaneous tissues.  This pus extended along the natural tissue planes anterior and posterior to the left sternocleidomastoid muscle.  Extended superiorly into the upper neck.  The pockets of pus were completely drained and the fibrinous debris excised.  There was pus extending deeply down to the area of the left internal jugular vein and carotid artery and these were carefully preserved.  The pus also extended down into the retrosternal space superiorly.  The left sternoclavicular joint appeared to be the primary focus.  It was opened anteriorly and there was a large amount of pus in the joint which was completely removed.  The synovial lining of the joint was completely removed and appeared necrotic.  The clavicle and sternum appeared intact with hard bone.  There is not appear to be any osteomyelitis.  Cultures were taken.  After complete drainage and debridement of all of the fibrinous material the wound was irrigated with saline.  A small vacuum sponge was cut the appropriate size and shape and positioned in the wound with part of the sponge extending deeply to the upper retrosternal space as well as into the base of the neck.  This was covered with the included occlusive dressing and vacuum at 75 mmHg was applied.  There did not appear to be any air leak.  There is complete hemostasis.  The patient tolerated procedure well and was then extubated and transported to the postanesthesia care unit in satisfactory condition.

## 2018-08-27 NOTE — Plan of Care (Signed)
  Problem: Health Behavior/Discharge Planning: Goal: Ability to manage health-related needs will improve Outcome: Progressing   Problem: Clinical Measurements: Goal: Ability to maintain clinical measurements within normal limits will improve Outcome: Progressing Goal: Will remain free from infection Outcome: Progressing Goal: Diagnostic test results will improve Outcome: Progressing Goal: Respiratory complications will improve Outcome: Progressing Goal: Cardiovascular complication will be avoided Outcome: Progressing   Problem: Activity: Goal: Risk for activity intolerance will decrease Outcome: Progressing   Problem: Safety: Goal: Ability to remain free from injury will improve Outcome: Progressing   Problem: Skin Integrity: Goal: Risk for impaired skin integrity will decrease Outcome: Progressing   Problem: Education: Goal: Knowledge of the prescribed therapeutic regimen will improve Outcome: Progressing   Problem: Activity: Goal: Ability to tolerate increased activity will improve Outcome: Progressing   Problem: Nutrition: Goal: Maintenance of adequate nutrition will improve Outcome: Progressing   Problem: Clinical Measurements: Goal: Complications related to the disease process, condition or treatment will be avoided or minimized Outcome: Progressing   Problem: Respiratory: Goal: Will regain and/or maintain adequate ventilation Outcome: Progressing   Problem: Skin Integrity: Goal: Demonstration of wound healing without infection will improve Outcome: Progressing

## 2018-08-27 NOTE — Progress Notes (Signed)
[facility] Infusion Coordinator will follow pt with ID team to support home infusion pharmacy support for home IV ABX if ordered at DC.  If patient discharges after hours, please call [phone].   Randy Oneal 08/27/2018, 4:53 AM

## 2018-08-27 NOTE — Progress Notes (Signed)
PROGRESS NOTE    Randy Oneal  KLK:[id number] DOB: [birth year] DOA: 08/16/2018 PCP: [name], MD    Brief Narrative:  53 year old male who presented with uncontrolled hyperglycemia. He does have significant past medical history for type 2 diabetes mellitus, hypertension and chronic kidney disease. Patient was sent to the hospital by his primary care provider due to altered mentation. He was not able to give any detailed history. Per his wife he had significant polyuria and polydipsia,with visual hallucinations for the last 2 days. On his initial physical examination temperature 98.6, heart rate 114, respirate 18, blood pressure 136/104, oxygen saturation 99%. Dry mucous membranes, heart S1-S2 present, tachycardic, lungs clear to auscultation bilaterally, abdomen soft nontender, no lower extremity edema. Positive right foot diabetic ulcer. Patient was disoriented and weak.Sodium 121, potassium 4.1, chloride 82, bicarb 24, glucose 432, BUN 67, creatinine 2.7, 0.8, hemoglobin 14.1, hematocrit 43.0, platelets 144.  Patient was admitted to the hospital working diagnosis of hyperosmolar nonketotic state,duplicated by right foot diabetic ulcer.   Assessment & Plan:   Principal Problem:   AKI (acute kidney injury) ([place]) Active Problems:   Type 2 diabetes mellitus not at goal [facility])   HTN (hypertension), benign   Personal history of noncompliance with medical treatment, presenting hazards to health   Hyperlipidemia associated with type 2 diabetes mellitus ([place])   Hyponatremia   Diabetic hyperosmolar non-ketotic state ([place])   Hyperglycemia   Cellulitis of great toe, right   1. Sepsis due to supraclavicular region multilocular abscess formation, due to strep B bacteremia,(present on admission) Antibiotic therapy has been changed to penicillin, will continue to follow on cell count and temperature curve, today lesion seems to be larger than yesterday. Will need picc line for  prolonged antibiotic therapy. Scheduled for I&D today. Will continue to follow ID and surgery recommendations. Wbc today at 14,3.   2. T2dm with uncontrolled hyperglycemia.fasting glucose today at 156, patient is tolerating po well. Patient NPO since midnight, due to procedure, he as received 30 units levimir at 10 am, will continue close glucose monitoring, surgical procedure scheduled for this pm.    3. HTN. Continue blood pressure control with amlodipine. Systolic blood pressure 979 mmHG today.   4. AKI on CKD stage 3, with hypokalemia and hyponatremia. renal function with serum cr up to 1,73 today from 1,43, will continue to follow on renal panel in am, continue hydration with saline at 75 ml per hour. Continue antibiotic therapy with penicillin.   5. Dyslipidemia. Continue atorvastatin.   6. Obesity.  BMI 30. Follow as outpatient.     DVT prophylaxis: enoxaparin   Code Status:  full Family Communication: no family at the bedside  Disposition Plan/ discharge barriers: pending surgical intervention, will also need picc line before discharge.   Body mass index is 31.76 kg/m. Malnutrition Type:  Nutrition Problem: Food and nutrition related knowledge deficit Etiology: limited prior education, wound healing   Malnutrition Characteristics:  Signs/Symptoms: per patient/family report   Nutrition Interventions:  Interventions: Education, MVI, Snacks  RN Pressure Injury Documentation:     Consultants:   ID  ENT  CT surgery   Procedures:     Antimicrobials:       Subjective: Patient continue to have pain on his left anterior chest lesion, tender to palpation, no improving factors, worse to touch, no nausea or vomiting, no chest pain or dyspnea.   Objective: Vitals:   08/26/18 0651 08/26/18 1206 08/26/18 1956 08/27/18 0650  BP:  (!) 163/86 Marland Kitchen)  164/81 (!) 157/76  Pulse:  99 (!) 104 (!) 107  Resp:  18 18 16   Temp: 98.8 F (37.1 C) 97.6 F (36.4 C)  98.4 F (36.9 C) 99.4 F (37.4 C)  TempSrc: Oral Oral Oral Oral  SpO2:  99% 98% 96%  Weight:    100.4 kg  Height:        Intake/Output Summary (Last 24 hours) at 08/27/2018 1137 Last data filed at 08/26/2018 1700 Gross per 24 hour  Intake 600 ml  Output -  Net 600 ml   Filed Weights   08/16/18 1502 08/25/18 0604 08/27/18 0650  Weight: 82.6 kg 97.5 kg 100.4 kg    Examination:   General: deconditioned.,  Neurology: Awake and alert, non focal  E ENT: no pallor, no icterus, oral mucosa moist Cardiovascular: No JVD. S1-S2 present, rhythmic, no gallops, rubs, or murmurs. No lower extremity edema. Pulmonary: vesicular breath sounds bilaterally, adequate air movement, no wheezing, rhonchi or rales. Gastrointestinal. Abdomen with no organomegaly, non tender, no rebound or guarding Skin. Left anterior chest, subclavicular region with induration, 3 to 4 cm in diameter, positive increased local temperature and discoloration.  Musculoskeletal: no joint deformities     Data Reviewed: I have personally reviewed following labs and imaging studies  CBC: Recent Labs  Lab 08/21/18 0601 08/22/18 0613 08/24/18 0839 08/27/18 0253  WBC 21.1* 20.8* 20.6* 14.3*  NEUTROABS  --   --   --  12.2*  HGB 10.5* 11.1* 9.8* 8.8*  HCT 32.4* 34.4* 30.8* 28.3*  MCV 78.3* 79.4* 80.6 80.2  PLT 303 386 477* 086*   Basic Metabolic Panel: Recent Labs  Lab 08/21/18 0601 08/22/18 0613 08/23/18 1010 08/24/18 0839 08/27/18 0253  NA 129* 129* 127* 133* 136  K 4.2 4.4 5.0 4.6 4.6  CL 100 100 100 106 108  CO2 22 21* 20* 21* 22  GLUCOSE 233* 168* 283* 110* 156*  BUN 30* 28* 26* 26* 24*  CREATININE 1.56* 1.54* 1.54* 1.45* 1.73*  CALCIUM 7.1* 7.3* 7.3* 7.6* 7.8*   GFR: Estimated Creatinine Clearance: 59.3 mL/min (A) (by C-G formula based on SCr of 1.73 mg/dL (H)). Liver Function Tests: No results for input(s): AST, ALT, ALKPHOS, BILITOT, PROT, ALBUMIN in the last 168 hours. No results for input(s):  LIPASE, AMYLASE in the last 168 hours. No results for input(s): AMMONIA in the last 168 hours. Coagulation Profile: No results for input(s): INR, PROTIME in the last 168 hours. Cardiac Enzymes: No results for input(s): CKTOTAL, CKMB, CKMBINDEX, TROPONINI in the last 168 hours. BNP (last 3 results) No results for input(s): PROBNP in the last 8760 hours. HbA1C: No results for input(s): HGBA1C in the last 72 hours. CBG: Recent Labs  Lab 08/26/18 0654 08/26/18 1121 08/26/18 1645 08/26/18 2110 08/27/18 0622  GLUCAP 205* 238* 201* 102* 126*   Lipid Profile: No results for input(s): CHOL, HDL, LDLCALC, TRIG, CHOLHDL, LDLDIRECT in the last 72 hours. Thyroid Function Tests: No results for input(s): TSH, T4TOTAL, FREET4, T3FREE, THYROIDAB in the last 72 hours. Anemia Panel: No results for input(s): VITAMINB12, FOLATE, FERRITIN, TIBC, IRON, RETICCTPCT in the last 72 hours.    Radiology Studies: I have reviewed all of the imaging during this hospital visit personally     Scheduled Meds: . amLODipine  10 mg Oral Daily  . atorvastatin  10 mg Oral Daily  . docusate sodium  100 mg Oral BID  . feeding supplement (PRO-STAT SUGAR FREE 64)  30 mL Oral BID  . heparin  5,000  Units Subcutaneous Q8H  . insulin aspart  0-15 Units Subcutaneous TID WC  . insulin aspart  0-5 Units Subcutaneous QHS  . insulin aspart  8 Units Subcutaneous TID WC  . insulin detemir  30 Units Subcutaneous Daily  . nystatin   Topical BID  . polyethylene glycol  17 g Oral Daily   Continuous Infusions: . sodium chloride Stopped (08/18/18 0947)  . 0.9 % NaCl with KCl 20 mEq / L 75 mL/hr at 08/26/18 2322  . pencillin G potassium IV 4 Million Units (08/27/18 1054)     LOS: 10 days         Gerome Apley, MD [facility] Pager [phone]

## 2018-08-27 NOTE — Anesthesia Procedure Notes (Signed)
Procedure Name: Intubation Date/Time: 08/27/2018 2:50 PM Performed by: Clearnce Sorrel, CRNA Pre-anesthesia Checklist: Patient identified, Emergency Drugs available, Patient being monitored and Suction available Patient Re-evaluated:Patient Re-evaluated prior to induction Oxygen Delivery Method: Circle system utilized Preoxygenation: Pre-oxygenation with 100% oxygen Induction Type: IV induction Ventilation: Mask ventilation without difficulty and Oral airway inserted - appropriate to patient size Laryngoscope Size: Mac and 3 Grade View: Grade II Tube type: Oral Tube size: 7.5 mm Number of attempts: 1 Airway Equipment and Method: Stylet Placement Confirmation: ETT inserted through vocal cords under direct vision,  positive ETCO2 and breath sounds checked- equal and bilateral Secured at: 22 cm Tube secured with: Tape Dental Injury: Teeth and Oropharynx as per pre-operative assessment

## 2018-08-28 DIAGNOSIS — Z978 Presence of other specified devices: Secondary | ICD-10-CM

## 2018-08-28 DIAGNOSIS — M00212 Other streptococcal arthritis, left shoulder: Secondary | ICD-10-CM

## 2018-08-28 DIAGNOSIS — E11621 Type 2 diabetes mellitus with foot ulcer: Secondary | ICD-10-CM

## 2018-08-28 DIAGNOSIS — L97529 Non-pressure chronic ulcer of other part of left foot with unspecified severity: Secondary | ICD-10-CM

## 2018-08-28 DIAGNOSIS — B951 Streptococcus, group B, as the cause of diseases classified elsewhere: Secondary | ICD-10-CM

## 2018-08-28 DIAGNOSIS — L97419 Non-pressure chronic ulcer of right heel and midfoot with unspecified severity: Secondary | ICD-10-CM

## 2018-08-28 LAB — CBC WITH DIFFERENTIAL/PLATELET
Abs Immature Granulocytes: 0 10*3/uL (ref 0.00–0.07)
Basophils Absolute: 0.3 10*3/uL — ABNORMAL HIGH (ref 0.0–0.1)
Basophils Relative: 2 %
Eosinophils Absolute: 0 10*3/uL (ref 0.0–0.5)
Eosinophils Relative: 0 %
HCT: 27.7 % — ABNORMAL LOW (ref 39.0–52.0)
Hemoglobin: 8.8 g/dL — ABNORMAL LOW (ref 13.0–17.0)
LYMPHS PCT: 6 %
Lymphs Abs: 0.8 10*3/uL (ref 0.7–4.0)
MCH: 25.8 pg — ABNORMAL LOW (ref 26.0–34.0)
MCHC: 31.8 g/dL (ref 30.0–36.0)
MCV: 81.2 fL (ref 80.0–100.0)
Monocytes Absolute: 0.1 10*3/uL (ref 0.1–1.0)
Monocytes Relative: 1 %
Neutro Abs: 11.9 10*3/uL — ABNORMAL HIGH (ref 1.7–7.7)
Neutrophils Relative %: 91 %
PLATELETS: 624 10*3/uL — AB (ref 150–400)
RBC: 3.41 MIL/uL — ABNORMAL LOW (ref 4.22–5.81)
RDW: 14.6 % (ref 11.5–15.5)
WBC: 13.1 10*3/uL — ABNORMAL HIGH (ref 4.0–10.5)
nRBC: 0 % (ref 0.0–0.2)
nRBC: 0 /100 WBC

## 2018-08-28 LAB — BASIC METABOLIC PANEL
Anion gap: 9 (ref 5–15)
BUN: 20 mg/dL (ref 6–20)
CO2: 20 mmol/L — ABNORMAL LOW (ref 22–32)
Calcium: 7.8 mg/dL — ABNORMAL LOW (ref 8.9–10.3)
Chloride: 105 mmol/L (ref 98–111)
Creatinine, Ser: 1.53 mg/dL — ABNORMAL HIGH (ref 0.61–1.24)
GFR calc Af Amer: 60 mL/min — ABNORMAL LOW (ref >60)
GFR, EST NON AFRICAN AMERICAN: 52 mL/min — AB (ref >60)
Glucose, Bld: 236 mg/dL — ABNORMAL HIGH (ref 70–99)
Potassium: 4.6 mmol/L (ref 3.5–5.1)
Sodium: 134 mmol/L — ABNORMAL LOW (ref 135–145)

## 2018-08-28 LAB — GLUCOSE, CAPILLARY
Glucose-Capillary: 258 mg/dL — ABNORMAL HIGH (ref 70–99)
Glucose-Capillary: 235 mg/dL — ABNORMAL HIGH (ref 70–99)
Glucose-Capillary: 109 mg/dL — ABNORMAL HIGH (ref 70–99)
Glucose-Capillary: 60 mg/dL — ABNORMAL LOW (ref 70–99)
Glucose-Capillary: 89 mg/dL (ref 70–99)

## 2018-08-28 MED ORDER — INSULIN DETEMIR 100 UNIT/ML ~~LOC~~ SOLN
40.0000 [IU] | Freq: Every day | SUBCUTANEOUS | Status: DC
  Administered 2018-08-28 – 2018-08-29 (×2): 40 [IU] via SUBCUTANEOUS
  Filled 2018-08-28 (×3): qty 0.4

## 2018-08-28 MED ORDER — AMLODIPINE BESYLATE 10 MG PO TABS
10.0000 mg | ORAL_TABLET | Freq: Every day | ORAL | Status: DC
  Administered 2018-08-28 – 2018-09-05 (×8): 10 mg via ORAL
  Filled 2018-08-28 (×8): qty 1

## 2018-08-28 NOTE — Progress Notes (Signed)
Subjective: No new complaints   Antibiotics:  Anti-infectives (From admission, onward)   Start     Dose/Rate Route Frequency Ordered Stop   08/27/18 1100  penicillin G potassium 4 Million Units in dextrose 5 % 250 mL IVPB     4 Million Units 250 mL/hr over 60 Minutes Intravenous Every 4 hours 08/27/18 0855     08/23/18 1400  Ampicillin-Sulbactam (UNASYN) 3 g in sodium chloride 0.9 % 100 mL IVPB  Status:  Discontinued     3 g 200 mL/hr over 30 Minutes Intravenous Every 6 hours 08/23/18 1254 08/27/18 0855   08/18/18 2100  vancomycin (VANCOCIN) IVPB 1000 mg/200 mL premix  Status:  Discontinued     1,000 mg 200 mL/hr over 60 Minutes Intravenous Every 24 hours 08/17/18 2134 08/18/18 1315   08/18/18 1600  penicillin G potassium 4 Million Units in dextrose 5 % 250 mL IVPB  Status:  Discontinued     4 Million Units 250 mL/hr over 60 Minutes Intravenous Every 4 hours 08/18/18 1315 08/23/18 1254   08/17/18 2200  metroNIDAZOLE (FLAGYL) IVPB 500 mg  Status:  Discontinued     500 mg 100 mL/hr over 60 Minutes Intravenous Every 8 hours 08/17/18 2108 08/18/18 1315   08/17/18 2200  ceFEPIme (MAXIPIME) 1 g in sodium chloride 0.9 % 100 mL IVPB  Status:  Discontinued     1 g 200 mL/hr over 30 Minutes Intravenous Every 12 hours 08/17/18 2132 08/18/18 1315   08/17/18 2145  vancomycin (VANCOCIN) 1,500 mg in sodium chloride 0.9 % 500 mL IVPB     1,500 mg 250 mL/hr over 120 Minutes Intravenous  Once 08/17/18 2132 08/18/18 0024   08/17/18 2130  vancomycin (VANCOCIN) IVPB 1000 mg/200 mL premix  Status:  Discontinued     1,000 mg 200 mL/hr over 60 Minutes Intravenous  Once 08/17/18 2106 08/17/18 2132   08/17/18 2115  cefTRIAXone (ROCEPHIN) 2 g in sodium chloride 0.9 % 100 mL IVPB  Status:  Discontinued     2 g 200 mL/hr over 30 Minutes Intravenous Every 24 hours 08/17/18 2106 08/17/18 2109   08/16/18 0000  sulfamethoxazole-trimethoprim (BACTRIM DS,SEPTRA DS) 800-160 MG tablet     1 tablet Oral 2  times daily 08/16/18 2215        Medications: Scheduled Meds: . amLODipine  10 mg Oral Daily  . atorvastatin  10 mg Oral Daily  . docusate sodium  100 mg Oral BID  . feeding supplement (PRO-STAT SUGAR FREE 64)  30 mL Oral BID  . insulin aspart  0-15 Units Subcutaneous TID WC  . insulin aspart  0-5 Units Subcutaneous QHS  . insulin aspart  8 Units Subcutaneous TID WC  . insulin detemir  40 Units Subcutaneous Daily  . nystatin   Topical BID  . polyethylene glycol  17 g Oral Daily   Continuous Infusions: . sodium chloride 10 mL/hr at 08/28/18 0803  . pencillin G potassium IV 4 Million Units (08/28/18 0952)   PRN Meds:.acetaminophen **OR** [DISCONTINUED] acetaminophen, fentaNYL (SUBLIMAZE) injection, labetalol, ondansetron **OR** ondansetron (ZOFRAN) IV, oxyCODONE    Objective: Weight change: 0 kg  Intake/Output Summary (Last 24 hours) at 08/28/2018 1059 Last data filed at 08/28/2018 1000 Gross per 24 hour  Intake 6019.36 ml  Output 2300 ml  Net 3719.36 ml   Blood pressure (!) 155/84, pulse 94, temperature 98.7 F (37.1 C), temperature source Oral, resp. rate (!) 26, height 5\' 10"  (1.778 m), weight 100.4 kg,  SpO2 97 %. Temp:  [97.8 F (36.6 C)-99.7 F (37.6 C)] 98.7 F (37.1 C) (01/18 0729) Pulse Rate:  [94-105] 94 (01/17 1805) Resp:  [0-26] 26 (01/18 1000) BP: (128-173)/(76-111) 155/84 (01/18 1000) SpO2:  [82 %-100 %] 97 % (01/18 1000) Weight:  [100.4 kg] 100.4 kg (01/17 1321)  Physical Exam: General: Alert and awake, oriented x3, not in any acute distress. HEENT: anicteric sclera, EOMI CVS regular rate, normal no murmurs gallops or rubs Chest: , no wheezing, no respiratory distress, lungs clear anteriorly Abdomen: soft non-distended,  Extremities: no edema or deformity noted bilaterally Skin:   Is a vacuum dressing at his sternoclavicular joint site.       Right and left feet pictured below January 10th 2020:          Neuro:  nonfocal  CBC:    BMET Recent Labs    08/27/18 0253 08/28/18 0257  NA 136 134*  K 4.6 4.6  CL 108 105  CO2 22 20*  GLUCOSE 156* 236*  BUN 24* 20  CREATININE 1.73* 1.53*  CALCIUM 7.8* 7.8*     Liver Panel  No results for input(s): PROT, ALBUMIN, AST, ALT, ALKPHOS, BILITOT, BILIDIR, IBILI in the last 72 hours.     Sedimentation Rate No results for input(s): ESRSEDRATE in the last 72 hours. C-Reactive Protein No results for input(s): CRP in the last 72 hours.  Micro Results: Recent Results (from the past 720 hour(s))  Culture, blood (routine x 2)     Status: Abnormal   Collection Time: 08/17/18 10:55 AM  Result Value Ref Range Status   Specimen Description BLOOD RIGHT ANTECUBITAL  Final   Special Requests   Final    BOTTLES DRAWN AEROBIC AND ANAEROBIC Blood Culture adequate volume   Culture  Setup Time   Final    IN BOTH AEROBIC AND ANAEROBIC BOTTLES GRAM POSITIVE COCCI IN CHAINS Gram Stain Report Called to,Read Back By and Verified With: PJ SEXTON,RN @0239  08/18/18 MKELLY Organism ID to follow    Culture GROUP B STREP(S.AGALACTIAE)ISOLATED (A)  Final   Report Status 08/20/2018 FINAL  Final   Organism ID, Bacteria GROUP B STREP(S.AGALACTIAE)ISOLATED  Final      Susceptibility   Group b strep(s.agalactiae)isolated - MIC*    CLINDAMYCIN <=0.25 SENSITIVE Sensitive     AMPICILLIN <=0.25 SENSITIVE Sensitive     ERYTHROMYCIN <=0.12 SENSITIVE Sensitive     VANCOMYCIN 0.5 SENSITIVE Sensitive     CEFTRIAXONE <=0.12 SENSITIVE Sensitive     LEVOFLOXACIN 4 INTERMEDIATE Intermediate     PENICILLIN Value in next row Sensitive      SENSITIVE<=0.12Performed at [facility], [place] [address], [place], [place] [address]    * GROUP B STREP(S.AGALACTIAE)ISOLATED  Blood Culture ID Panel (Reflexed)     Status: Abnormal   Collection Time: 08/17/18 10:55 AM  Result Value Ref Range Status   Enterococcus species NOT DETECTED NOT DETECTED Final   Listeria monocytogenes NOT  DETECTED NOT DETECTED Final   Staphylococcus species NOT DETECTED NOT DETECTED Final   Staphylococcus aureus (BCID) NOT DETECTED NOT DETECTED Final   Streptococcus species DETECTED (A) NOT DETECTED Final    Comment: CRITICAL RESULT CALLED TO, READ BACK BY AND VERIFIED WITH: Forest Gleason PharmD 9:45 08/18/18 ([email or web address])    Streptococcus agalactiae DETECTED (A) NOT DETECTED Final    Comment: CRITICAL RESULT CALLED TO, READ BACK BY AND VERIFIED WITH: Forest Gleason PharmD 9:45 08/18/18 ([email or web address])    Streptococcus pneumoniae NOT DETECTED NOT DETECTED Final  Streptococcus pyogenes NOT DETECTED NOT DETECTED Final   Acinetobacter baumannii NOT DETECTED NOT DETECTED Final   Enterobacteriaceae species NOT DETECTED NOT DETECTED Final   Enterobacter cloacae complex NOT DETECTED NOT DETECTED Final   Escherichia coli NOT DETECTED NOT DETECTED Final   Klebsiella oxytoca NOT DETECTED NOT DETECTED Final   Klebsiella pneumoniae NOT DETECTED NOT DETECTED Final   Proteus species NOT DETECTED NOT DETECTED Final   Serratia marcescens NOT DETECTED NOT DETECTED Final   Haemophilus influenzae NOT DETECTED NOT DETECTED Final   Neisseria meningitidis NOT DETECTED NOT DETECTED Final   Pseudomonas aeruginosa NOT DETECTED NOT DETECTED Final   Candida albicans NOT DETECTED NOT DETECTED Final   Candida glabrata NOT DETECTED NOT DETECTED Final   Candida krusei NOT DETECTED NOT DETECTED Final   Candida parapsilosis NOT DETECTED NOT DETECTED Final   Candida tropicalis NOT DETECTED NOT DETECTED Final    Comment: Performed at [facility], [place] [address] [place], [place] [address]  Culture, blood (routine x 2)     Status: Abnormal   Collection Time: 08/17/18 11:04 AM  Result Value Ref Range Status   Specimen Description   Final    BLOOD LEFT HAND Performed at [facility], [address] [place], [place] [address]    Special Requests   Final    BOTTLES DRAWN AEROBIC ONLY Blood Culture results may not be optimal due to an  inadequate volume of blood received in culture bottles Performed at [facility], [address] [place], [place] [address]    Culture  Setup Time   Final    AEROBIC BOTTLE ONLY GRAM POSITIVE COCCI IN CHAINS Gram Stain Report Called to,Read Back By and Verified With: PJ SEXTON,RN @0235  08/18/18 [facility] Performed at [facility], [address], [place], [place] [address]    Culture (A)  Final    GROUP B STREP(S.AGALACTIAE)ISOLATED SUSCEPTIBILITIES PERFORMED ON PREVIOUS CULTURE WITHIN THE LAST 5 DAYS. Performed at [facility], [place] [address], [place], [place] [address]    Report Status 08/20/2018 FINAL  Final  Culture, blood (Routine X 2) w Reflex to ID Panel     Status: None   Collection Time: 08/19/18  6:58 PM  Result Value Ref Range Status   Specimen Description BLOOD RIGHT HAND  Final   Special Requests   Final    BOTTLES DRAWN AEROBIC AND ANAEROBIC Blood Culture adequate volume   Culture   Final    NO GROWTH 5 DAYS Performed at [facility], [address] [place], [place] [address]    Report Status 08/24/2018 FINAL  Final  Culture, blood (Routine X 2) w Reflex to ID Panel     Status: None   Collection Time: 08/19/18  7:07 PM  Result Value Ref Range Status   Specimen Description BLOOD LEFT HAND  Final   Special Requests   Final    BOTTLES DRAWN AEROBIC AND ANAEROBIC Blood Culture adequate volume   Culture   Final    NO GROWTH 5 DAYS Performed at [facility], [address], [place], [place] [address]    Report Status 08/24/2018 FINAL  Final  Surgical pcr screen     Status: None   Collection Time: 08/26/18  8:36 PM  Result Value Ref Range Status   MRSA, PCR NEGATIVE NEGATIVE Final   Staphylococcus aureus NEGATIVE NEGATIVE Final    Comment: (NOTE) The Xpert SA Assay (FDA approved for NASAL specimens in patients 71 years of age and older), is  one component of a comprehensive surveillance program. It is not intended to diagnose infection nor to guide or monitor  treatment. Performed at [facility], [place] [address] [place], [place] [address]   Aerobic/Anaerobic Culture (surgical/deep wound)     Status: None (Preliminary result)   Collection Time: 08/27/18  3:23 PM  Result Value Ref Range Status   Specimen Description ABSCESS  Final   Special Requests LEFT NECK SAMPLE A  Final   Gram Stain   Final    MODERATE WBC PRESENT,BOTH PMN AND MONONUCLEAR FEW GRAM VARIABLE ROD RARE GRAM POSITIVE COCCI IN PAIRS Performed at [facility], [place] [address] [place],  [address]    Culture PENDING  Incomplete   Report Status PENDING  Incomplete  Aerobic/Anaerobic Culture (surgical/deep wound)     Status: None (Preliminary result)   Collection Time: 08/27/18  4:03 PM  Result Value Ref Range Status   Specimen Description ABSCESS  Final   Special Requests LEFT STERNOCLAVICULAR JOINT SAMPLE B  Final   Gram Stain   Final    ABUNDANT WBC PRESENT, PREDOMINANTLY PMN NO ORGANISMS SEEN    Culture PENDING  Incomplete   Report Status PENDING  Incomplete    Studies/Results: No results found.    Assessment/Plan:  INTERVAL HISTORY: Patient taken to the operating room yesterday   Principal Problem:   AKI (acute kidney injury) ([place]) Active Problems:   Type 2 diabetes mellitus not at goal [facility])   HTN (hypertension), benign   Personal history of noncompliance with medical treatment, presenting hazards to health   Hyperlipidemia associated with type 2 diabetes mellitus ([place])   Hyponatremia   Diabetic hyperosmolar non-ketotic state ([place])   Hyperglycemia   Cellulitis of great toe, right   Mediastinal abscess ([place])    Alcario Tinkey III is a 53 y.o. male with poorly controlled diabetes mellitus hypertension and chronic kidney disease initially presented any [facility] with altered mental status and found to have group B strep bacteremia.  He did have pain in his chest and neck and was found to have gas and an abscess and likely septic arthritis of  the sternoclavicular joint.  He was sent to come in for surgical further surgical evaluation.  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  He has now undergone cardiothoracic surgery with incision and drainage and debridement of the left sternoclavicular joint left neck and mediastinum.  Cultures are incubating but likely will grow group B strep again if they grow something.  1.  Severe group B streptococcal infection with involvement of the neck sternoclavicular joint and mediastinum:  My own thinking is this process was separate from the problems with his feet and that he has 2 things going on at the same time but regardless he is going to need a protracted course of antibiotics to treat his septic sternoclavicular joint infection.  I have gone over options of penicillin versus ceftriaxone the patient agrees that penicillin would be the safer option.  Penicillin is a more bactericidal drug when it can be used and it is going to be more specific for his group B strep without the higher risk for C. difficile colitis that a cephalosporin would provide.  Therefore we will continue with penicillin and if he eventually goes home with home health he should be set up with a continuous infusion of penicillin.  We will follow-up his  operative cultures  2.  Infection of feet: He has had an MRI of the right foot that shows no evidence of osteomyelitis which is 1 of my anxieties.  We will just need to follow these clinically going forward.  We will follow-up the culture data but will otherwise sign off soon and arrange for hospital follow-up for the patient and also for outpatient antimicrobial therapy should he be deemed suitable for discharge to home versus a skilled nursing facility.   LOS: 11 days   Alcide Evener 08/28/2018, 10:59 AM

## 2018-08-28 NOTE — Progress Notes (Addendum)
PROGRESS NOTE    Randy Oneal  TMH:[id number] DOB: [birth year] DOA: 08/16/2018 PCP: [name], MD    Brief Narrative:  53 year old male who presented with uncontrolled hyperglycemia. He does have significant past medical history for type 2 diabetes mellitus, hypertension and chronic kidney disease. Patient was sent to the hospital by his primary care provider due to altered mentation. He was not able to give any detailed history. Per his wife he had significant polyuria and polydipsia,with visual hallucinations for the last 2 days. On his initial physical examination temperature 98.6, heart rate 114, respirate 18, blood pressure 136/104, oxygen saturation 99%. Dry mucous membranes, heart S1-S2 present, tachycardic, lungs clear to auscultation bilaterally, abdomen soft nontender, no lower extremity edema. Positive right foot diabetic ulcer. Patient was disoriented and weak.Sodium 121, potassium 4.1, chloride 82, bicarb 24, glucose 432, BUN 67, creatinine 2.7, 0.8, hemoglobin 14.1, hematocrit 43.0, platelets 144.  Patient was admitted to the hospital working diagnosis of hyperosmolar nonketotic state,duplicated by right foot diabetic ulcer.   Assessment & Plan:   Principal Problem:   AKI (acute kidney injury) ([place]) Active Problems:   Type 2 diabetes mellitus not at goal [facility])   HTN (hypertension), benign   Personal history of noncompliance with medical treatment, presenting hazards to health   Hyperlipidemia associated with type 2 diabetes mellitus ([place])   Hyponatremia   Diabetic hyperosmolar non-ketotic state ([place])   Hyperglycemia   Cellulitis of great toe, right   Mediastinal abscess ([place])   1. Sepsis due tosupraclavicular region multilocular abscess formation,due tostrep B bacteremia,(present on admission). Patient is sp debridement and wound vac application to left chest. Will continue wound care per surgery team, and IV antibiotic therapy with IV penicillin.  Patient has remained afebrile, his wbc today at 13.1.  2. T2dm with uncontrolled hyperglycemia.His fasting glucose today at 236, capillary glucose 258 and 235, insulin glargine has been increased to 40 units, at home is on 30 units. On 8 units of insulin aspart pre-meal tid. Will continue insulin sliding scale for glucose cover and monitoring, patient is tolerating po well. Will target a more tight blood glucose control in the setting of post surgical status. Patient is at high risk for hypoglycemia due to decreased GFR, caution needed with insulin adjustment.   3. HTN.On amlodipine for blood pressure control.  4. AKI on CKD stage 3, with hypokalemia and hyponatremia.Serum cr today at 1,53 with K at 4,6 with serum bicarbonate at 20, will continue to follow on renal panel in am, avoid hypotension and nephrotoxic medications.  5. Dyslipidemia.On atorvastatin.   6. Obesity.  BMI 30. Follow as outpatient, will need lifestyle modifications.    7. Right toe infection. No osteomyelitis, will continue local wound care. Continue IV penicillin.   DVT prophylaxis: enoxaparin   Code Status:  full Family Communication: no family at the bedside  Disposition Plan/ discharge barriers: Attending is Dr. Cyndia Bent, I will continue to assist in diabetic management and renal function monitoring while hospitalized.     Body mass index is 31.76 kg/m. Malnutrition Type:  Nutrition Problem: Food and nutrition related knowledge deficit Etiology: limited prior education, wound healing   Malnutrition Characteristics:  Signs/Symptoms: per patient/family report   Nutrition Interventions:  Interventions: Education, MVI, Snacks  RN Pressure Injury Documentation:     Consultants:     Procedures:     Antimicrobials:   Penicillin     Subjective: Patient is tolerating po well, left chest wall pain is controlled, wound vac is in  place. No nausea or vomiting and is tolerating po well.    Objective: Vitals:   08/28/18 1100 08/28/18 1119 08/28/18 1220 08/28/18 1300  BP: (!) 158/90  (!) 171/116 (!) 167/83  Pulse:      Resp: 16  (!) 26 (!) 21  Temp:  100 F (37.8 C)    TempSrc:  Oral    SpO2: 99%  100% 99%  Weight:      Height:        Intake/Output Summary (Last 24 hours) at 08/28/2018 1324 Last data filed at 08/28/2018 1200 Gross per 24 hour  Intake 6415.88 ml  Output 2000 ml  Net 4415.88 ml   Filed Weights   08/25/18 0604 08/27/18 0650 08/27/18 1321  Weight: 97.5 kg 100.4 kg 100.4 kg    Examination:   General: deconditioned  Neurology: Awake and alert, non focal  E ENT: mild pallor, no icterus, oral mucosa moist Cardiovascular: No JVD. S1-S2 present, rhythmic, no gallops, rubs, or murmurs. No lower extremity edema. Pulmonary: positive breath sounds bilaterally, adequate air movement, no wheezing, rhonchi or rales. Gastrointestinal. Abdomen with no organomegaly, non tender, no rebound or guarding Skin. Wound vac in place on the left chest wall.  Musculoskeletal: no joint deformities   Data Reviewed: I have personally reviewed following labs and imaging studies  CBC: Recent Labs  Lab 08/22/18 0613 08/24/18 0839 08/27/18 0253 08/28/18 0257  WBC 20.8* 20.6* 14.3* 13.1*  NEUTROABS  --   --  12.2* 11.9*  HGB 11.1* 9.8* 8.8* 8.8*  HCT 34.4* 30.8* 28.3* 27.7*  MCV 79.4* 80.6 80.2 81.2  PLT 386 477* 602* 462*   Basic Metabolic Panel: Recent Labs  Lab 08/22/18 0613 08/23/18 1010 08/24/18 0839 08/27/18 0253 08/28/18 0257  NA 129* 127* 133* 136 134*  K 4.4 5.0 4.6 4.6 4.6  CL 100 100 106 108 105  CO2 21* 20* 21* 22 20*  GLUCOSE 168* 283* 110* 156* 236*  BUN 28* 26* 26* 24* 20  CREATININE 1.54* 1.54* 1.45* 1.73* 1.53*  CALCIUM 7.3* 7.3* 7.6* 7.8* 7.8*   GFR: Estimated Creatinine Clearance: 67.1 mL/min (A) (by C-G formula based on SCr of 1.53 mg/dL (H)). Liver Function Tests: No results for input(s): AST, ALT, ALKPHOS, BILITOT, PROT,  ALBUMIN in the last 168 hours. No results for input(s): LIPASE, AMYLASE in the last 168 hours. No results for input(s): AMMONIA in the last 168 hours. Coagulation Profile: No results for input(s): INR, PROTIME in the last 168 hours. Cardiac Enzymes: No results for input(s): CKTOTAL, CKMB, CKMBINDEX, TROPONINI in the last 168 hours. BNP (last 3 results) No results for input(s): PROBNP in the last 8760 hours. HbA1C: No results for input(s): HGBA1C in the last 72 hours. CBG: Recent Labs  Lab 08/27/18 1146 08/27/18 1653 08/27/18 2135 08/28/18 0634 08/28/18 1121  GLUCAP 124* 137* 168* 258* 235*   Lipid Profile: No results for input(s): CHOL, HDL, LDLCALC, TRIG, CHOLHDL, LDLDIRECT in the last 72 hours. Thyroid Function Tests: No results for input(s): TSH, T4TOTAL, FREET4, T3FREE, THYROIDAB in the last 72 hours. Anemia Panel: No results for input(s): VITAMINB12, FOLATE, FERRITIN, TIBC, IRON, RETICCTPCT in the last 72 hours.    Radiology Studies: I have reviewed all of the imaging during this hospital visit personally     Scheduled Meds: . amLODipine  10 mg Oral Daily  . atorvastatin  10 mg Oral Daily  . docusate sodium  100 mg Oral BID  . feeding supplement (PRO-STAT SUGAR FREE 64)  30 mL  Oral BID  . insulin aspart  0-15 Units Subcutaneous TID WC  . insulin aspart  0-5 Units Subcutaneous QHS  . insulin aspart  8 Units Subcutaneous TID WC  . insulin detemir  40 Units Subcutaneous Daily  . nystatin   Topical BID  . polyethylene glycol  17 g Oral Daily   Continuous Infusions: . sodium chloride Stopped (08/28/18 1131)  . pencillin G potassium IV 4 Million Units (08/28/18 8937)     LOS: 11 days        Samil Mecham Gerome Apley, MD [facility] Pager [phone]

## 2018-08-28 NOTE — Progress Notes (Signed)
1 Day Post-Op Procedure(s) (LRB): Incision and DEBRIDEMENT Left Chest, Neck and Mediastinum (Left) APPLICATION OF WOUND VAC, left neck (Left) Subjective: No complaints  Objective: Vital signs in last 24 hours: Temp:  [97.8 F (36.6 C)-99.7 F (37.6 C)] 98.7 F (37.1 C) (01/18 0729) Pulse Rate:  [94-105] 94 (01/17 1805) Cardiac Rhythm: Normal sinus rhythm (01/18 0400) Resp:  [0-24] 20 (01/18 0700) BP: (128-173)/(76-111) 152/79 (01/18 0700) SpO2:  [82 %-100 %] 97 % (01/18 0700) Weight:  [100.4 kg] 100.4 kg (01/17 1321)  Hemodynamic parameters for last 24 hours:    Intake/Output from previous day: 01/17 0701 - 01/18 0700 In: 5515.1 [P.O.:555; I.V.:4510.1; IV Piggyback:250] Out: 2300 [Urine:2150; Drains:100; Blood:50] Intake/Output this shift: No intake/output data recorded.  General appearance: alert and cooperative Neurologic: intact Heart: regular rate and rhythm, S1, S2 normal, no murmur, click, rub or gallop Lungs: clear to auscultation bilaterally Extremities: edema mild Wound: VAC in place and functioning normally. Minimal drainage.  Lab Results: Recent Labs    08/27/18 0253 08/28/18 0257  WBC 14.3* 13.1*  HGB 8.8* 8.8*  HCT 28.3* 27.7*  PLT 602* 624*   BMET:  Recent Labs    08/27/18 0253 08/28/18 0257  NA 136 134*  K 4.6 4.6  CL 108 105  CO2 22 20*  GLUCOSE 156* 236*  BUN 24* 20  CREATININE 1.73* 1.53*  CALCIUM 7.8* 7.8*    PT/INR: No results for input(s): LABPROT, INR in the last 72 hours. ABG No results found for: PHART, HCO3, TCO2, ACIDBASEDEF, O2SAT CBG (last 3)  Recent Labs    08/27/18 1653 08/27/18 2135 08/28/18 0634  GLUCAP 137* 168* 258*    Assessment/Plan: S/P Procedure(s) (LRB): Incision and DEBRIDEMENT Left Chest, Neck and Mediastinum (Left) APPLICATION OF WOUND VAC, left neck (Left)  POD 1 Hemodynamically stable in sinus rhythm. Mild sinus tachycardia. He received prn Labetalol overnight for HTN. Will resume Norvasc  10.  Afebrile and WBC coming down. Continue antibiotic per ID for GBS bacteremia.  Continue VAC wound care and I will plan to change Monday in OR under anesthesia in case further debridement needed.  Attention to nutrition  OOB, mobilize  DM: glucose 258 this am. Will increase Levemir and continue meal coverage and SSI.   LOS: 11 days    Gaye Pollack 08/28/2018

## 2018-08-28 NOTE — Plan of Care (Signed)
  Problem: Health Behavior/Discharge Planning: Goal: Ability to manage health-related needs will improve Outcome: Progressing   Problem: Clinical Measurements: Goal: Ability to maintain clinical measurements within normal limits will improve Outcome: Progressing Goal: Will remain free from infection Outcome: Progressing Goal: Diagnostic test results will improve Outcome: Progressing Goal: Respiratory complications will improve Outcome: Progressing Goal: Cardiovascular complication will be avoided Outcome: Progressing   Problem: Activity: Goal: Risk for activity intolerance will decrease Outcome: Progressing   Problem: Safety: Goal: Ability to remain free from injury will improve Outcome: Progressing   Problem: Skin Integrity: Goal: Risk for impaired skin integrity will decrease Outcome: Progressing   Problem: Education: Goal: Knowledge of the prescribed therapeutic regimen will improve Outcome: Progressing   Problem: Activity: Goal: Ability to tolerate increased activity will improve Outcome: Progressing   Problem: Nutrition: Goal: Maintenance of adequate nutrition will improve Outcome: Progressing   Problem: Clinical Measurements: Goal: Complications related to the disease process, condition or treatment will be avoided or minimized Outcome: Progressing   Problem: Respiratory: Goal: Will regain and/or maintain adequate ventilation Outcome: Progressing   Problem: Skin Integrity: Goal: Demonstration of wound healing without infection will improve Outcome: Progressing

## 2018-08-28 NOTE — Progress Notes (Signed)
Patient ID: Randy Oneal, male   DOB: 06/05/1966, 54 y.o.   MRN: [id number] TCTS Evening Rounds:  Afebrile, hemodynamically stable  Up in chair without complaints. Glucose 109 this afternoon.

## 2018-08-29 DIAGNOSIS — J853 Abscess of mediastinum: Secondary | ICD-10-CM

## 2018-08-29 DIAGNOSIS — S91101A Unspecified open wound of right great toe without damage to nail, initial encounter: Secondary | ICD-10-CM

## 2018-08-29 DIAGNOSIS — M009 Pyogenic arthritis, unspecified: Secondary | ICD-10-CM

## 2018-08-29 LAB — CBC WITH DIFFERENTIAL/PLATELET
Abs Immature Granulocytes: 0.1 10*3/uL — ABNORMAL HIGH (ref 0.00–0.07)
Basophils Absolute: 0 10*3/uL (ref 0.0–0.1)
Basophils Relative: 0 %
Eosinophils Absolute: 0.1 10*3/uL (ref 0.0–0.5)
Eosinophils Relative: 1 %
HEMATOCRIT: 23.3 % — AB (ref 39.0–52.0)
HEMOGLOBIN: 7.2 g/dL — AB (ref 13.0–17.0)
Immature Granulocytes: 1 %
Lymphocytes Relative: 12 %
Lymphs Abs: 1.3 10*3/uL (ref 0.7–4.0)
MCH: 25 pg — ABNORMAL LOW (ref 26.0–34.0)
MCHC: 30.9 g/dL (ref 30.0–36.0)
MCV: 80.9 fL (ref 80.0–100.0)
Monocytes Absolute: 0.7 10*3/uL (ref 0.1–1.0)
Monocytes Relative: 7 %
Neutro Abs: 8.3 10*3/uL — ABNORMAL HIGH (ref 1.7–7.7)
Neutrophils Relative %: 79 %
Platelets: 487 10*3/uL — ABNORMAL HIGH (ref 150–400)
RBC: 2.88 MIL/uL — AB (ref 4.22–5.81)
RDW: 14.4 % (ref 11.5–15.5)
WBC: 10.5 10*3/uL (ref 4.0–10.5)
nRBC: 0 % (ref 0.0–0.2)

## 2018-08-29 LAB — GLUCOSE, CAPILLARY
Glucose-Capillary: 149 mg/dL — ABNORMAL HIGH (ref 70–99)
Glucose-Capillary: 186 mg/dL — ABNORMAL HIGH (ref 70–99)
Glucose-Capillary: 127 mg/dL — ABNORMAL HIGH (ref 70–99)
Glucose-Capillary: 46 mg/dL — ABNORMAL LOW (ref 70–99)
Glucose-Capillary: 50 mg/dL — ABNORMAL LOW (ref 70–99)
Glucose-Capillary: 65 mg/dL — ABNORMAL LOW (ref 70–99)

## 2018-08-29 LAB — BASIC METABOLIC PANEL
Anion gap: 8 (ref 5–15)
BUN: 19 mg/dL (ref 6–20)
CO2: 22 mmol/L (ref 22–32)
Calcium: 7.6 mg/dL — ABNORMAL LOW (ref 8.9–10.3)
Chloride: 104 mmol/L (ref 98–111)
Creatinine, Ser: 1.48 mg/dL — ABNORMAL HIGH (ref 0.61–1.24)
GFR calc non Af Amer: 54 mL/min — ABNORMAL LOW (ref >60)
Glucose, Bld: 125 mg/dL — ABNORMAL HIGH (ref 70–99)
Potassium: 4.2 mmol/L (ref 3.5–5.1)
SODIUM: 134 mmol/L — AB (ref 135–145)

## 2018-08-29 LAB — ABO/RH: ABO/RH(D): O POS

## 2018-08-29 LAB — TYPE AND SCREEN
ABO/RH(D): O POS
Antibody Screen: NEGATIVE

## 2018-08-29 LAB — GLUCOSE, RANDOM: Glucose, Bld: 79 mg/dL (ref 70–99)

## 2018-08-29 MED ORDER — DEXTROSE 50 % IV SOLN
INTRAVENOUS | Status: AC
  Filled 2018-08-29: qty 50

## 2018-08-29 MED ORDER — INSULIN ASPART 100 UNIT/ML ~~LOC~~ SOLN
5.0000 [IU] | Freq: Three times a day (TID) | SUBCUTANEOUS | Status: DC
  Administered 2018-08-29 – 2018-09-02 (×10): 5 [IU] via SUBCUTANEOUS

## 2018-08-29 MED ORDER — SODIUM CHLORIDE 0.9% IV SOLUTION: Freq: Once | INTRAVENOUS | Status: DC

## 2018-08-29 NOTE — Progress Notes (Signed)
Diagnosis: Septic Sternoclavicular joint and Mediastinal abscess  Culture Result: Group Streptococcus  No Known Allergies  OPAT Orders Discharge antibiotics: Penicillin 24 million UNITS via continuous infusion daily   Duration: 6 weeks End Date:  March 3rd, 2020  PIC Care Per Protocol:  Labs weekly while on IV antibiotics: x__ CBC with differential x__ BMP  x__ CRP _x_ ESR  __ Please pull PIC at completion of IV antibiotics _x_ Please leave PIC in place until doctor has seen patient or been notified  Fax weekly labs to [phone]  Clinic Follow Up Appt:  With Dr. Van Dam on 10/05/2018 at  9am @ [facility]   

## 2018-08-29 NOTE — Progress Notes (Signed)
[facility] Progress Note Patient Name: Bettie Swavely III DOB: August 22, 1965 MRN: [id number]   Date of Service  08/29/2018  HPI/Events of Note  Pt hypoglycemic, given juices but still hypoglycemic.  eICU Interventions  Check serum glucose.     Intervention Category Minor Interventions: Other:  Elsie Lincoln 08/29/2018, 9:44 PM

## 2018-08-29 NOTE — Progress Notes (Signed)
2 Days Post-Op Procedure(s) (LRB): Incision and DEBRIDEMENT Left Chest, Neck and Mediastinum (Left) APPLICATION OF WOUND VAC, left neck (Left) Subjective: No complaints  Objective: Vital signs in last 24 hours: Temp:  [98.7 F (37.1 C)-99.6 F (37.6 C)] 99.1 F (37.3 C) (01/19 1100) Cardiac Rhythm: Normal sinus rhythm (01/19 1200) Resp:  [12-24] 19 (01/19 1200) BP: (127-167)/(74-92) 159/92 (01/19 1200) SpO2:  [91 %-100 %] 98 % (01/19 1200) Weight:  [102.2 kg] 102.2 kg (01/19 0213)  Hemodynamic parameters for last 24 hours:    Intake/Output from previous day: 01/18 0701 - 01/19 0700 In: 1853.6 [P.O.:960; I.V.:143.6; IV Piggyback:750] Out: 1150 [Urine:1150] Intake/Output this shift: Total I/O In: 539.8 [I.V.:39.8; IV Piggyback:500] Out: 550 [Urine:550]  General appearance: alert and cooperative Heart: regular rate and rhythm, S1, S2 normal, no murmur, click, rub or gallop Lungs: clear to auscultation bilaterally Wound: VAC dressing in place and working appropriately  Lab Results: Recent Labs    08/28/18 0257 08/29/18 0242  WBC 13.1* 10.5  HGB 8.8* 7.2*  HCT 27.7* 23.3*  PLT 624* 487*   BMET:  Recent Labs    08/28/18 0257 08/29/18 0242  NA 134* 134*  K 4.6 4.2  CL 105 104  CO2 20* 22  GLUCOSE 236* 125*  BUN 20 19  CREATININE 1.53* 1.48*  CALCIUM 7.8* 7.6*    PT/INR: No results for input(s): LABPROT, INR in the last 72 hours. ABG No results found for: PHART, HCO3, TCO2, ACIDBASEDEF, O2SAT CBG (last 3)  Recent Labs    08/28/18 2214 08/29/18 0648 08/29/18 1124  GLUCAP 89 149* 186*    Assessment/Plan: S/P Procedure(s) (LRB): Incision and DEBRIDEMENT Left Chest, Neck and Mediastinum (Left) APPLICATION OF WOUND VAC, left neck (Left)  He is afebrile and WBC ct back to normal. Plan to change VAC dressing and debride wound if necessary in the OR tomorrow. I discussed procedure with the patient and he understands agrees to proceed.   LOS: 12 days     Gaye Pollack 08/29/2018

## 2018-08-29 NOTE — Progress Notes (Addendum)
Subjective: No new complaints   Antibiotics:  Anti-infectives (From admission, onward)   Start     Dose/Rate Route Frequency Ordered Stop   08/27/18 1100  penicillin G potassium 4 Million Units in dextrose 5 % 250 mL IVPB     4 Million Units 250 mL/hr over 60 Minutes Intravenous Every 4 hours 08/27/18 0855     08/23/18 1400  Ampicillin-Sulbactam (UNASYN) 3 g in sodium chloride 0.9 % 100 mL IVPB  Status:  Discontinued     3 g 200 mL/hr over 30 Minutes Intravenous Every 6 hours 08/23/18 1254 08/27/18 0855   08/18/18 2100  vancomycin (VANCOCIN) IVPB 1000 mg/200 mL premix  Status:  Discontinued     1,000 mg 200 mL/hr over 60 Minutes Intravenous Every 24 hours 08/17/18 2134 08/18/18 1315   08/18/18 1600  penicillin G potassium 4 Million Units in dextrose 5 % 250 mL IVPB  Status:  Discontinued     4 Million Units 250 mL/hr over 60 Minutes Intravenous Every 4 hours 08/18/18 1315 08/23/18 1254   08/17/18 2200  metroNIDAZOLE (FLAGYL) IVPB 500 mg  Status:  Discontinued     500 mg 100 mL/hr over 60 Minutes Intravenous Every 8 hours 08/17/18 2108 08/18/18 1315   08/17/18 2200  ceFEPIme (MAXIPIME) 1 g in sodium chloride 0.9 % 100 mL IVPB  Status:  Discontinued     1 g 200 mL/hr over 30 Minutes Intravenous Every 12 hours 08/17/18 2132 08/18/18 1315   08/17/18 2145  vancomycin (VANCOCIN) 1,500 mg in sodium chloride 0.9 % 500 mL IVPB     1,500 mg 250 mL/hr over 120 Minutes Intravenous  Once 08/17/18 2132 08/18/18 0024   08/17/18 2130  vancomycin (VANCOCIN) IVPB 1000 mg/200 mL premix  Status:  Discontinued     1,000 mg 200 mL/hr over 60 Minutes Intravenous  Once 08/17/18 2106 08/17/18 2132   08/17/18 2115  cefTRIAXone (ROCEPHIN) 2 g in sodium chloride 0.9 % 100 mL IVPB  Status:  Discontinued     2 g 200 mL/hr over 30 Minutes Intravenous Every 24 hours 08/17/18 2106 08/17/18 2109   08/16/18 0000  sulfamethoxazole-trimethoprim (BACTRIM DS,SEPTRA DS) 800-160 MG tablet     1 tablet Oral 2  times daily 08/16/18 2215        Medications: Scheduled Meds: . sodium chloride   Intravenous Once  . amLODipine  10 mg Oral Daily  . atorvastatin  10 mg Oral Daily  . docusate sodium  100 mg Oral BID  . feeding supplement (PRO-STAT SUGAR FREE 64)  30 mL Oral BID  . insulin aspart  0-15 Units Subcutaneous TID WC  . insulin aspart  0-5 Units Subcutaneous QHS  . insulin aspart  5 Units Subcutaneous TID WC  . insulin detemir  40 Units Subcutaneous Daily  . nystatin   Topical BID  . polyethylene glycol  17 g Oral Daily   Continuous Infusions: . sodium chloride 10 mL/hr at 08/29/18 1200  . pencillin G potassium IV 4 Million Units (08/29/18 0940)   PRN Meds:.acetaminophen **OR** [DISCONTINUED] acetaminophen, fentaNYL (SUBLIMAZE) injection, labetalol, ondansetron **OR** ondansetron (ZOFRAN) IV, oxyCODONE    Objective: Weight change: 1.8 kg  Intake/Output Summary (Last 24 hours) at 08/29/2018 1405 Last data filed at 08/29/2018 1200 Gross per 24 hour  Intake 1392.57 ml  Output 1700 ml  Net -307.43 ml   Blood pressure (!) 159/92, pulse 94, temperature 99.1 F (37.3 C), temperature source Oral, resp. rate 19, height  5\' 10"  (1.778 m), weight 102.2 kg, SpO2 98 %. Temp:  [98.7 F (37.1 C)-99.6 F (37.6 C)] 99.1 F (37.3 C) (01/19 1100) Resp:  [12-24] 19 (01/19 1200) BP: (127-167)/(74-92) 159/92 (01/19 1200) SpO2:  [91 %-100 %] 98 % (01/19 1200) Weight:  [102.2 kg] 102.2 kg (01/19 0213)  Physical Exam: General: Alert and awake, oriented x3, not in any acute distress. HEENT: anicteric sclera, EOMI CVS regular rate, normal no murmurs gallops or rubs Chest: , no wheezing, no respiratory distress, lungs clear anteriorly Abdomen: soft non-distended,   Skin:   Is a vacuum dressing at his sternoclavicular joint site yesterdays picture       Right and left feet pictured below January 10th 2020:          Neuro: nonfocal  CBC:    BMET Recent Labs     08/28/18 0257 08/29/18 0242  NA 134* 134*  K 4.6 4.2  CL 105 104  CO2 20* 22  GLUCOSE 236* 125*  BUN 20 19  CREATININE 1.53* 1.48*  CALCIUM 7.8* 7.6*     Liver Panel  No results for input(s): PROT, ALBUMIN, AST, ALT, ALKPHOS, BILITOT, BILIDIR, IBILI in the last 72 hours.     Sedimentation Rate No results for input(s): ESRSEDRATE in the last 72 hours. C-Reactive Protein No results for input(s): CRP in the last 72 hours.  Micro Results: Recent Results (from the past 720 hour(s))  Culture, blood (routine x 2)     Status: Abnormal   Collection Time: 08/17/18 10:55 AM  Result Value Ref Range Status   Specimen Description BLOOD RIGHT ANTECUBITAL  Final   Special Requests   Final    BOTTLES DRAWN AEROBIC AND ANAEROBIC Blood Culture adequate volume   Culture  Setup Time   Final    IN BOTH AEROBIC AND ANAEROBIC BOTTLES GRAM POSITIVE COCCI IN CHAINS Gram Stain Report Called to,Read Back By and Verified With: PJ SEXTON,RN @0239  08/18/18 MKELLY Organism ID to follow    Culture GROUP B STREP(S.AGALACTIAE)ISOLATED (A)  Final   Report Status 08/20/2018 FINAL  Final   Organism ID, Bacteria GROUP B STREP(S.AGALACTIAE)ISOLATED  Final      Susceptibility   Group b strep(s.agalactiae)isolated - MIC*    CLINDAMYCIN <=0.25 SENSITIVE Sensitive     AMPICILLIN <=0.25 SENSITIVE Sensitive     ERYTHROMYCIN <=0.12 SENSITIVE Sensitive     VANCOMYCIN 0.5 SENSITIVE Sensitive     CEFTRIAXONE <=0.12 SENSITIVE Sensitive     LEVOFLOXACIN 4 INTERMEDIATE Intermediate     PENICILLIN Value in next row Sensitive      SENSITIVE<=0.12Performed at [facility], [place] [address], [place], [place] [address]    * GROUP B STREP(S.AGALACTIAE)ISOLATED  Blood Culture ID Panel (Reflexed)     Status: Abnormal   Collection Time: 08/17/18 10:55 AM  Result Value Ref Range Status   Enterococcus species NOT DETECTED NOT DETECTED Final   Listeria monocytogenes NOT DETECTED NOT DETECTED Final   Staphylococcus  species NOT DETECTED NOT DETECTED Final   Staphylococcus aureus (BCID) NOT DETECTED NOT DETECTED Final   Streptococcus species DETECTED (A) NOT DETECTED Final    Comment: CRITICAL RESULT CALLED TO, READ BACK BY AND VERIFIED WITH: Forest Gleason PharmD 9:45 08/18/18 ([email or web address])    Streptococcus agalactiae DETECTED (A) NOT DETECTED Final    Comment: CRITICAL RESULT CALLED TO, READ BACK BY AND VERIFIED WITH: Forest Gleason PharmD 9:45 08/18/18 ([email or web address])    Streptococcus pneumoniae NOT DETECTED NOT DETECTED Final   Streptococcus pyogenes NOT  DETECTED NOT DETECTED Final   Acinetobacter baumannii NOT DETECTED NOT DETECTED Final   Enterobacteriaceae species NOT DETECTED NOT DETECTED Final   Enterobacter cloacae complex NOT DETECTED NOT DETECTED Final   Escherichia coli NOT DETECTED NOT DETECTED Final   Klebsiella oxytoca NOT DETECTED NOT DETECTED Final   Klebsiella pneumoniae NOT DETECTED NOT DETECTED Final   Proteus species NOT DETECTED NOT DETECTED Final   Serratia marcescens NOT DETECTED NOT DETECTED Final   Haemophilus influenzae NOT DETECTED NOT DETECTED Final   Neisseria meningitidis NOT DETECTED NOT DETECTED Final   Pseudomonas aeruginosa NOT DETECTED NOT DETECTED Final   Candida albicans NOT DETECTED NOT DETECTED Final   Candida glabrata NOT DETECTED NOT DETECTED Final   Candida krusei NOT DETECTED NOT DETECTED Final   Candida parapsilosis NOT DETECTED NOT DETECTED Final   Candida tropicalis NOT DETECTED NOT DETECTED Final    Comment: Performed at [facility], [place] [address], [place], [place] [address]  Culture, blood (routine x 2)     Status: Abnormal   Collection Time: 08/17/18 11:04 AM  Result Value Ref Range Status   Specimen Description   Final    BLOOD LEFT HAND Performed at [facility], [address], [place], [place] [address]    Special Requests   Final    BOTTLES DRAWN AEROBIC ONLY Blood Culture results may not be optimal due to an inadequate volume of blood received in culture  bottles Performed at [facility], [address], [place], [place] [address]    Culture  Setup Time   Final    AEROBIC BOTTLE ONLY GRAM POSITIVE COCCI IN CHAINS Gram Stain Report Called to,Read Back By and Verified With: PJ SEXTON,RN @0235  08/18/18 [facility] Performed at [facility], [address] [place], [place] [address]    Culture (A)  Final    GROUP B STREP(S.AGALACTIAE)ISOLATED SUSCEPTIBILITIES PERFORMED ON PREVIOUS CULTURE WITHIN THE LAST 5 DAYS. Performed at [facility], [place] [address], [place], [place] [address]    Report Status 08/20/2018 FINAL  Final  Culture, blood (Routine X 2) w Reflex to ID Panel     Status: None   Collection Time: 08/19/18  6:58 PM  Result Value Ref Range Status   Specimen Description BLOOD RIGHT HAND  Final   Special Requests   Final    BOTTLES DRAWN AEROBIC AND ANAEROBIC Blood Culture adequate volume   Culture   Final    NO GROWTH 5 DAYS Performed at [facility], [address] [place], [place] [address]    Report Status 08/24/2018 FINAL  Final  Culture, blood (Routine X 2) w Reflex to ID Panel     Status: None   Collection Time: 08/19/18  7:07 PM  Result Value Ref Range Status   Specimen Description BLOOD LEFT HAND  Final   Special Requests   Final    BOTTLES DRAWN AEROBIC AND ANAEROBIC Blood Culture adequate volume   Culture   Final    NO GROWTH 5 DAYS Performed at [facility], [address], [place], [place] [address]    Report Status 08/24/2018 FINAL  Final  Surgical pcr screen     Status: None   Collection Time: 08/26/18  8:36 PM  Result Value Ref Range Status   MRSA, PCR NEGATIVE NEGATIVE Final   Staphylococcus aureus NEGATIVE NEGATIVE Final    Comment: (NOTE) The Xpert SA Assay (FDA approved for NASAL specimens in patients 45 years of age and older), is one component of  a comprehensive surveillance program. It is not intended to diagnose infection nor to guide or monitor treatment. Performed at [facility],  [place] [address], [place], [place] [address]   Aerobic/Anaerobic Culture (surgical/deep wound)     Status: None (Preliminary result)   Collection Time: 08/27/18  3:23 PM  Result Value Ref Range Status   Specimen Description ABSCESS  Final   Special Requests LEFT NECK SAMPLE A  Final   Gram Stain   Final    MODERATE WBC PRESENT,BOTH PMN AND MONONUCLEAR FEW GRAM VARIABLE ROD RARE GRAM POSITIVE COCCI IN PAIRS    Culture   Final    NO GROWTH 2 DAYS NO ANAEROBES ISOLATED; CULTURE IN PROGRESS FOR 5 DAYS Performed at [facility], [place] [address] [place], [place] [address]    Report Status PENDING  Incomplete  Aerobic/Anaerobic Culture (surgical/deep wound)     Status: None (Preliminary result)   Collection Time: 08/27/18  4:03 PM  Result Value Ref Range Status   Specimen Description ABSCESS  Final   Special Requests LEFT STERNOCLAVICULAR JOINT SAMPLE B  Final   Gram Stain   Final    ABUNDANT WBC PRESENT, PREDOMINANTLY PMN NO ORGANISMS SEEN    Culture   Final    NO GROWTH 2 DAYS NO ANAEROBES ISOLATED; CULTURE IN PROGRESS FOR 5 DAYS Performed at [facility], [place] [address] [place], [place] [address]    Report Status PENDING  Incomplete    Studies/Results: No results found.    Assessment/Plan:  INTERVAL HISTORY: intra-op cultures no growth   Principal Problem:   AKI (acute kidney injury) ([place]) Active Problems:   Type 2 diabetes mellitus not at goal [facility])   HTN (hypertension), benign   Personal history of noncompliance with medical treatment, presenting hazards to health   Hyperlipidemia associated with type 2 diabetes mellitus ([place])   Hyponatremia   Diabetic hyperosmolar non-ketotic state ([place])   Hyperglycemia   Cellulitis of great toe, right   Mediastinal abscess ([place])    Randy Oneal is a 53 y.o. male with poorly controlled diabetes mellitus hypertension and chronic kidney disease initially presented any [facility] with altered mental status and found to have  group B strep bacteremia.  He did have pain in his chest and neck and was found to have gas and an abscess and likely septic arthritis of the sternoclavicular joint.  He was sent to come in for surgical further surgical evaluation.  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  He has now undergone cardiothoracic surgery with incision and drainage and debridement of the left sternoclavicular joint left neck and mediastinum.  Cultures are incubating but likely will grow group B strep again if they grow something.  1.  Severe group B streptococcal infection with involvement of the neck sternoclavicular joint and mediastinum:  --continue PCN --We will follow-up his operative cultures  2.  Infection of feet: He has had an MRI of the right foot that shows no evidence of osteomyelitis which is 1 of my anxieties.  We will just need to follow these clinically going forward.    I will put in OPAT orders.  I have scheduled him to see me on 10/05/2018 at [place] at [facility] clinic  We will follow-up the culture data but will otherwise sign off.  Please call with further questions.   LOS: 12 days  Rhina Brackett Dam 08/29/2018, 2:05 PM

## 2018-08-29 NOTE — Progress Notes (Signed)
PHARMACY CONSULT NOTE FOR:  OUTPATIENT  PARENTERAL ANTIBIOTIC THERAPY (OPAT)  Indication: Bacteremia  Regimen: PCN G 24 million units q 24 hours End date: 10/12/2018  IV antibiotic discharge orders are pended. To discharging provider:  please sign these orders via discharge navigator,  Select New Orders & click on the button choice - Manage This Unsigned Work.     Thank you for allowing pharmacy to be a part of this patient's care.  Georgina Peer 08/29/2018, 2:36 PM

## 2018-08-29 NOTE — Progress Notes (Addendum)
Hypoglycemic Event  CBG: 50  Treatment: 8 oz juice/soda  Symptoms: Shaky  Follow-up CBG: Time: 2115 CBG Result: 46  Possible Reasons for Event: Unknown  Comments/MD notified:Pt AOx4 c/o shakiness and "wooziness"    Randy Oneal  Repeat CBG 46, repeated treatment of 8oz juice and shaved ice. Repeat CBG 65. Pt says he feels better and not having symptoms of hypoglycemia. CBG blood mixed with serous fluid. Elink MD notified and ordered STAT serum glucose.   Continuing to monitor patient closely.

## 2018-08-29 NOTE — Progress Notes (Signed)
PROGRESS NOTE    Randy Oneal  LJQ:[id number] DOB: 1966-01-14 DOA: 08/16/2018 PCP: [name], MD    Brief Narrative:  53 year old male who presented with uncontrolled hyperglycemia. He does have significant past medical history for type 2 diabetes mellitus, hypertension and chronic kidney disease. Patient was sent to the hospital by his primary care provider due to altered mentation. He was not able to give any detailed history. Per his wife he had significant polyuria and polydipsia,with visual hallucinations for the last 2 days. On his initial physical examination temperature 98.6, heart rate 114, respirate 18, blood pressure 136/104, oxygen saturation 99%. Dry mucous membranes, heart S1-S2 present, tachycardic, lungs clear to auscultation bilaterally, abdomen soft nontender, no lower extremity edema. Positive right foot diabetic ulcer. Patient was disoriented and weak.Sodium 121, potassium 4.1, chloride 82, bicarb 24, glucose 432, BUN 67, creatinine 2.7, 0.8, hemoglobin 14.1, hematocrit 43.0, platelets 144.  Patient was admitted to the hospital working diagnosis of hyperosmolar nonketotic state,complicated with right foot diabetic ulcer.  During his hospitalization it was noted to have a left upper chest induration, further testing revealed a abscess, patient transferred to [facility] for ENT and CT surgery evaluation.   Assessment & Plan:   Principal Problem:   AKI (acute kidney injury) ([place]) Active Problems:   Type 2 diabetes mellitus not at goal [facility])   HTN (hypertension), benign   Personal history of noncompliance with medical treatment, presenting hazards to health   Hyperlipidemia associated with type 2 diabetes mellitus ([place])   Hyponatremia   Diabetic hyperosmolar non-ketotic state ([place])   Hyperglycemia   Cellulitis of great toe, right   Mediastinal abscess ([place])  1. Sepsis due tosupraclavicular region multilocular abscess formation,due tostrep B  bacteremia,(present on admission). Patient is sp debridement and wound vac application to left chest.  Continue IV antibiotic therapy with IV penicillin, will need prolonged course per ID recommendation, likely will need a picc line before discharge. Wbc continue to trend down to 10.5 today.   2. T2dm with uncontrolled hyperglycemia.Patient had episode of hypoglycemia down to 60mg /dl  last night at 2100. Currently on 40 units of glargine, plus 8 units of pre-meal aspart plus insulin sliding scale.  Will decrease pre-meal insulin to 5 units. Patient getting insulin glargine at 9 am, and he has received his dose for today. If patient will be NPO after midnight will recommend to hold am long acting insulin until patient able to eat post procedure.    3. HTN.Continue blood pressure control with amlodipine.  4. AKI on CKD stage 3, with hypokalemia and hyponatremia.Renal function with serum cr at 1,48 with K at 4,2 and serum bicarbonate at 22. Will continue to follow on renal panel in am, avoid hypotension or nephrotoxic medications.  5. Dyslipidemia.Continue with atorvastatin.  6. Obesity. BMI 30. Will need outpatient follow up.  7. Right toe infection. Continue local wound care.  DVT prophylaxis:enoxaparin Code Status:full Family Communication:no family at the bedside Disposition Plan/ discharge barriers:Attending is Dr. Cyndia Bent, I will continue to assist in diabetic management and renal function monitoring while hospitalized.   Body mass index is 32.33 kg/m. Malnutrition Type:  Nutrition Problem: Food and nutrition related knowledge deficit Etiology: limited prior education, wound healing   Malnutrition Characteristics:  Signs/Symptoms: per patient/family report   Nutrition Interventions:  Interventions: Education, MVI, Snacks  RN Pressure Injury Documentation:     Consultants:    Procedures:  sp debridement and wound vac application to left  chest  Antimicrobials:   Penicillin  Subjective: Patient had episode of hypoglycemia last night, this am with no nausea or vomiting, left upper chest wound with wound vac in place, pain is well controlled.   Objective: Vitals:   08/29/18 0600 08/29/18 0700 08/29/18 0725 08/29/18 0800  BP: (!) 154/80 (!) 150/77  (!) 152/79  Pulse:      Resp: 19 17  (!) 24  Temp:   98.8 F (37.1 C)   TempSrc:   Oral   SpO2: 97% 97%  98%  Weight:      Height:        Intake/Output Summary (Last 24 hours) at 08/29/2018 1037 Last data filed at 08/29/2018 0800 Gross per 24 hour  Intake 1359.18 ml  Output 1150 ml  Net 209.18 ml   Filed Weights   08/27/18 0650 08/27/18 1321 08/29/18 0213  Weight: 100.4 kg 100.4 kg 102.2 kg    Examination:   General: not in pain or dyspnea.  Neurology: Awake and alert, non focal  E ENT: mild pallor, no icterus, oral mucosa moist Cardiovascular: No JVD. S1-S2 present, rhythmic, no gallops, rubs, or murmurs. No lower extremity edema. Pulmonary: positive breath sounds bilaterally, adequate air movement, no wheezing, rhonchi or rales. Gastrointestinal. Abdomen with no organomegaly, non tender, no rebound or guarding Skin. Left chest wall with wound vac in place. Non tender.  Musculoskeletal: no joint deformities     Data Reviewed: I have personally reviewed following labs and imaging studies  CBC: Recent Labs  Lab 08/24/18 0839 08/27/18 0253 08/28/18 0257 08/29/18 0242  WBC 20.6* 14.3* 13.1* 10.5  NEUTROABS  --  12.2* 11.9* 8.3*  HGB 9.8* 8.8* 8.8* 7.2*  HCT 30.8* 28.3* 27.7* 23.3*  MCV 80.6 80.2 81.2 80.9  PLT 477* 602* 624* 237*   Basic Metabolic Panel: Recent Labs  Lab 08/23/18 1010 08/24/18 0839 08/27/18 0253 08/28/18 0257 08/29/18 0242  NA 127* 133* 136 134* 134*  K 5.0 4.6 4.6 4.6 4.2  CL 100 106 108 105 104  CO2 20* 21* 22 20* 22  GLUCOSE 283* 110* 156* 236* 125*  BUN 26* 26* 24* 20 19  CREATININE 1.54* 1.45* 1.73* 1.53* 1.48*   CALCIUM 7.3* 7.6* 7.8* 7.8* 7.6*   GFR: Estimated Creatinine Clearance: 69.9 mL/min (A) (by C-G formula based on SCr of 1.48 mg/dL (H)). Liver Function Tests: No results for input(s): AST, ALT, ALKPHOS, BILITOT, PROT, ALBUMIN in the last 168 hours. No results for input(s): LIPASE, AMYLASE in the last 168 hours. No results for input(s): AMMONIA in the last 168 hours. Coagulation Profile: No results for input(s): INR, PROTIME in the last 168 hours. Cardiac Enzymes: No results for input(s): CKTOTAL, CKMB, CKMBINDEX, TROPONINI in the last 168 hours. BNP (last 3 results) No results for input(s): PROBNP in the last 8760 hours. HbA1C: No results for input(s): HGBA1C in the last 72 hours. CBG: Recent Labs  Lab 08/28/18 1121 08/28/18 1623 08/28/18 2111 08/28/18 2214 08/29/18 0648  GLUCAP 235* 109* 60* 89 149*   Lipid Profile: No results for input(s): CHOL, HDL, LDLCALC, TRIG, CHOLHDL, LDLDIRECT in the last 72 hours. Thyroid Function Tests: No results for input(s): TSH, T4TOTAL, FREET4, T3FREE, THYROIDAB in the last 72 hours. Anemia Panel: No results for input(s): VITAMINB12, FOLATE, FERRITIN, TIBC, IRON, RETICCTPCT in the last 72 hours.    Radiology Studies: I have reviewed all of the imaging during this hospital visit personally     Scheduled Meds: . amLODipine  10 mg Oral Daily  . atorvastatin  10 mg Oral  Daily  . docusate sodium  100 mg Oral BID  . feeding supplement (PRO-STAT SUGAR FREE 64)  30 mL Oral BID  . insulin aspart  0-15 Units Subcutaneous TID WC  . insulin aspart  0-5 Units Subcutaneous QHS  . insulin aspart  8 Units Subcutaneous TID WC  . insulin detemir  40 Units Subcutaneous Daily  . nystatin   Topical BID  . polyethylene glycol  17 g Oral Daily   Continuous Infusions: . sodium chloride 10 mL/hr at 08/29/18 0800  . pencillin G potassium IV 4 Million Units (08/29/18 0940)     LOS: 12 days        Mauricio Gerome Apley, MD [facility] Pager [phone]

## 2018-08-30 ENCOUNTER — Inpatient Hospital Stay (HOSPITAL_COMMUNITY): Payer: Managed Care, Other (non HMO) | Admitting: Anesthesiology

## 2018-08-30 ENCOUNTER — Encounter (HOSPITAL_COMMUNITY): Payer: Self-pay

## 2018-08-30 ENCOUNTER — Encounter (HOSPITAL_COMMUNITY)
Admission: EM | Disposition: A | Discharge status: Home Health | Payer: Self-pay | Source: Home / Self Care | Attending: Surgery

## 2018-08-30 DIAGNOSIS — L0211 Cutaneous abscess of neck: Secondary | ICD-10-CM

## 2018-08-30 HISTORY — PX: STERNAL WOUND DEBRIDEMENT: SHX1058

## 2018-08-30 HISTORY — PX: APPLICATION OF WOUND VAC: SHX5189

## 2018-08-30 LAB — CBC WITH DIFFERENTIAL/PLATELET
Abs Immature Granulocytes: 0.07 10*3/uL (ref 0.00–0.07)
Basophils Absolute: 0 10*3/uL (ref 0.0–0.1)
Basophils Relative: 0 %
Eosinophils Absolute: 0.1 10*3/uL (ref 0.0–0.5)
Eosinophils Relative: 1 %
HCT: 23.4 % — ABNORMAL LOW (ref 39.0–52.0)
Hemoglobin: 7.4 g/dL — ABNORMAL LOW (ref 13.0–17.0)
Immature Granulocytes: 1 %
Lymphocytes Relative: 14 %
Lymphs Abs: 1.2 10*3/uL (ref 0.7–4.0)
MCH: 25.9 pg — AB (ref 26.0–34.0)
MCHC: 31.6 g/dL (ref 30.0–36.0)
MCV: 81.8 fL (ref 80.0–100.0)
Monocytes Absolute: 0.6 10*3/uL (ref 0.1–1.0)
Monocytes Relative: 7 %
NRBC: 0 % (ref 0.0–0.2)
Neutro Abs: 7.1 10*3/uL (ref 1.7–7.7)
Neutrophils Relative %: 77 %
Platelets: 474 10*3/uL — ABNORMAL HIGH (ref 150–400)
RBC: 2.86 MIL/uL — ABNORMAL LOW (ref 4.22–5.81)
RDW: 14.3 % (ref 11.5–15.5)
WBC: 9.1 10*3/uL (ref 4.0–10.5)

## 2018-08-30 LAB — BASIC METABOLIC PANEL
Anion gap: 8 (ref 5–15)
BUN: 18 mg/dL (ref 6–20)
CO2: 21 mmol/L — ABNORMAL LOW (ref 22–32)
Calcium: 7.5 mg/dL — ABNORMAL LOW (ref 8.9–10.3)
Chloride: 102 mmol/L (ref 98–111)
Creatinine, Ser: 1.66 mg/dL — ABNORMAL HIGH (ref 0.61–1.24)
GFR, EST AFRICAN AMERICAN: 54 mL/min — AB (ref >60)
GFR, EST NON AFRICAN AMERICAN: 47 mL/min — AB (ref >60)
Glucose, Bld: 208 mg/dL — ABNORMAL HIGH (ref 70–99)
Potassium: 4.2 mmol/L (ref 3.5–5.1)
Sodium: 131 mmol/L — ABNORMAL LOW (ref 135–145)

## 2018-08-30 LAB — GLUCOSE, CAPILLARY
GLUCOSE-CAPILLARY: 158 mg/dL — AB (ref 70–99)
GLUCOSE-CAPILLARY: 131 mg/dL — AB (ref 70–99)
Glucose-Capillary: 236 mg/dL — ABNORMAL HIGH (ref 70–99)
Glucose-Capillary: 206 mg/dL — ABNORMAL HIGH (ref 70–99)
Glucose-Capillary: 138 mg/dL — ABNORMAL HIGH (ref 70–99)
Glucose-Capillary: 257 mg/dL — ABNORMAL HIGH (ref 70–99)

## 2018-08-30 SURGERY — DEBRIDEMENT, WOUND, STERNUM
Anesthesia: General | Laterality: Left

## 2018-08-30 MED ORDER — MIDAZOLAM HCL 5 MG/5ML IJ SOLN
INTRAMUSCULAR | Status: DC | PRN
  Administered 2018-08-30: 2 mg via INTRAVENOUS

## 2018-08-30 MED ORDER — PROMETHAZINE HCL 25 MG/ML IJ SOLN: 6.2500 mg | INTRAMUSCULAR | Status: DC | PRN

## 2018-08-30 MED ORDER — MIDAZOLAM HCL 2 MG/2ML IJ SOLN
INTRAMUSCULAR | Status: AC
  Filled 2018-08-30: qty 2

## 2018-08-30 MED ORDER — ONDANSETRON HCL 4 MG/2ML IJ SOLN
INTRAMUSCULAR | Status: DC | PRN
  Administered 2018-08-30: 4 mg via INTRAVENOUS

## 2018-08-30 MED ORDER — PROPOFOL 10 MG/ML IV BOLUS
INTRAVENOUS | Status: AC
  Filled 2018-08-30: qty 40

## 2018-08-30 MED ORDER — PROPOFOL 10 MG/ML IV BOLUS
INTRAVENOUS | Status: DC | PRN
  Administered 2018-08-30: 180 mg via INTRAVENOUS
  Administered 2018-08-30: 20 mg via INTRAVENOUS

## 2018-08-30 MED ORDER — INSULIN DETEMIR 100 UNIT/ML ~~LOC~~ SOLN
30.0000 [IU] | Freq: Every day | SUBCUTANEOUS | Status: DC
  Administered 2018-08-31 – 2018-09-01 (×2): 30 [IU] via SUBCUTANEOUS
  Filled 2018-08-30 (×3): qty 0.3

## 2018-08-30 MED ORDER — LACTATED RINGERS IV SOLN
INTRAVENOUS | Status: DC
  Administered 2018-08-30: 14:00:00 via INTRAVENOUS

## 2018-08-30 MED ORDER — FENTANYL CITRATE (PF) 100 MCG/2ML IJ SOLN
25.0000 ug | INTRAMUSCULAR | Status: DC | PRN
  Administered 2018-08-30: 50 ug via INTRAVENOUS

## 2018-08-30 MED ORDER — SODIUM CHLORIDE 0.9 % IV SOLN
INTRAVENOUS | Status: DC | PRN
  Administered 2018-08-30: 40 ug/min via INTRAVENOUS

## 2018-08-30 MED ORDER — VANCOMYCIN HCL 1000 MG IV SOLR
INTRAVENOUS | Status: AC
  Filled 2018-08-30: qty 1000

## 2018-08-30 MED ORDER — FENTANYL CITRATE (PF) 250 MCG/5ML IJ SOLN
INTRAMUSCULAR | Status: AC
  Filled 2018-08-30: qty 5

## 2018-08-30 MED ORDER — LIDOCAINE 2% (20 MG/ML) 5 ML SYRINGE
INTRAMUSCULAR | Status: DC | PRN
  Administered 2018-08-30: 80 mg via INTRAVENOUS

## 2018-08-30 MED ORDER — FENTANYL CITRATE (PF) 100 MCG/2ML IJ SOLN
INTRAMUSCULAR | Status: AC
  Filled 2018-08-30: qty 2

## 2018-08-30 MED ORDER — FENTANYL CITRATE (PF) 250 MCG/5ML IJ SOLN
INTRAMUSCULAR | Status: DC | PRN
  Administered 2018-08-30 (×2): 100 ug via INTRAVENOUS

## 2018-08-30 MED ORDER — SUCCINYLCHOLINE CHLORIDE 200 MG/10ML IV SOSY
PREFILLED_SYRINGE | INTRAVENOUS | Status: DC | PRN
  Administered 2018-08-30: 100 mg via INTRAVENOUS

## 2018-08-30 MED ORDER — PENICILLIN G POTASSIUM 20000000 UNITS IJ SOLR
4.0000 10*6.[IU] | INTRAVENOUS | Status: DC
  Filled 2018-08-30: qty 4

## 2018-08-30 MED ORDER — 0.9 % SODIUM CHLORIDE (POUR BTL) OPTIME
TOPICAL | Status: DC | PRN
  Administered 2018-08-30: 1000 mL

## 2018-08-30 SURGICAL SUPPLY — 53 items
ATTRACTOMAT 16X20 MAGNETIC DRP (DRAPES) ×3 IMPLANT
BAG DECANTER FOR FLEXI CONT (MISCELLANEOUS) ×3 IMPLANT
BLADE SURG 10 STRL SS (BLADE) ×6 IMPLANT
BNDG GAUZE ELAST 4 BULKY (GAUZE/BANDAGES/DRESSINGS) IMPLANT
CANISTER SUCT 3000ML PPV (MISCELLANEOUS) ×3 IMPLANT
CANISTER WOUND CARE 500ML ATS (WOUND CARE) ×2 IMPLANT
CATH THORACIC 28FR RT ANG (CATHETERS) IMPLANT
CATH THORACIC 36FR (CATHETERS) IMPLANT
CATH THORACIC 36FR RT ANG (CATHETERS) IMPLANT
CLIP VESOCCLUDE SM WIDE 24/CT (CLIP) IMPLANT
CONT SPEC 4OZ CLIKSEAL STRL BL (MISCELLANEOUS) IMPLANT
COVER SURGICAL LIGHT HANDLE (MISCELLANEOUS) ×6 IMPLANT
COVER WAND RF STERILE (DRAPES) ×1 IMPLANT
DRAPE LAPAROSCOPIC ABDOMINAL (DRAPES) ×3 IMPLANT
DRSG PAD ABDOMINAL 8X10 ST (GAUZE/BANDAGES/DRESSINGS) ×9 IMPLANT
ELECT REM PT RETURN 9FT ADLT (ELECTROSURGICAL) ×3
ELECTRODE REM PT RTRN 9FT ADLT (ELECTROSURGICAL) ×1 IMPLANT
GAUZE SPONGE 4X4 12PLY STRL (GAUZE/BANDAGES/DRESSINGS) ×3 IMPLANT
GAUZE XEROFORM 5X9 LF (GAUZE/BANDAGES/DRESSINGS) IMPLANT
GLOVE EUDERMIC 7 POWDERFREE (GLOVE) ×6 IMPLANT
GOWN STRL REUS W/ TWL LRG LVL3 (GOWN DISPOSABLE) ×4 IMPLANT
GOWN STRL REUS W/ TWL XL LVL3 (GOWN DISPOSABLE) ×1 IMPLANT
GOWN STRL REUS W/TWL LRG LVL3 (GOWN DISPOSABLE) ×12
GOWN STRL REUS W/TWL XL LVL3 (GOWN DISPOSABLE) ×3
HANDPIECE INTERPULSE COAX TIP (DISPOSABLE)
HEMOSTAT SURGICEL 2X14 (HEMOSTASIS) IMPLANT
KIT BASIN OR (CUSTOM PROCEDURE TRAY) ×3 IMPLANT
KIT DRSG VAC SLVR GRANUFM (MISCELLANEOUS) ×4 IMPLANT
KIT SUCTION CATH 14FR (SUCTIONS) IMPLANT
KIT TURNOVER KIT B (KITS) ×3 IMPLANT
MARKER SKIN DUAL TIP RULER LAB (MISCELLANEOUS) IMPLANT
NS IRRIG 1000ML POUR BTL (IV SOLUTION) ×3 IMPLANT
PACK CHEST (CUSTOM PROCEDURE TRAY) ×1 IMPLANT
PACK GENERAL/GYN (CUSTOM PROCEDURE TRAY) ×2 IMPLANT
PAD ARMBOARD 7.5X6 YLW CONV (MISCELLANEOUS) ×6 IMPLANT
PIN SAFETY STERILE (MISCELLANEOUS) IMPLANT
RUBBERBAND STERILE (MISCELLANEOUS) IMPLANT
SET HNDPC FAN SPRY TIP SCT (DISPOSABLE) IMPLANT
SPONGE LAP 18X18 X RAY DECT (DISPOSABLE) ×3 IMPLANT
STAPLER VISISTAT 35W (STAPLE) IMPLANT
STRAP MONTGOMERY 1.25X11-1/8 (MISCELLANEOUS) IMPLANT
SUT ETHILON 3 0 FSL (SUTURE) IMPLANT
SUT STEEL 6MS V (SUTURE) IMPLANT
SUT STEEL STERNAL CCS#1 18IN (SUTURE) IMPLANT
SUT STEEL SZ 6 DBL 3X14 BALL (SUTURE) IMPLANT
SUT VIC AB 1 CTX 36 (SUTURE) ×6
SUT VIC AB 1 CTX36XBRD ANBCTR (SUTURE) ×2 IMPLANT
SWAB CULTURE ESWAB REG 1ML (MISCELLANEOUS) IMPLANT
SYR 5ML LL (SYRINGE) IMPLANT
TOWEL GREEN STERILE (TOWEL DISPOSABLE) ×3 IMPLANT
TOWEL GREEN STERILE FF (TOWEL DISPOSABLE) ×3 IMPLANT
TRAY FOLEY MTR SLVR 14FR STAT (SET/KITS/TRAYS/PACK) IMPLANT
WATER STERILE IRR 1000ML POUR (IV SOLUTION) ×3 IMPLANT

## 2018-08-30 NOTE — Anesthesia Postprocedure Evaluation (Signed)
Anesthesia Post Note  Patient: Randy Oneal  Procedure(s) Performed: Incision and DEBRIDEMENT Left Chest, Neck and Mediastinum (Left Neck) APPLICATION OF WOUND VAC, left neck (Left Neck)     Patient location during evaluation: Other Anesthesia Type: General Level of consciousness: awake and alert Pain management: pain level controlled Vital Signs Assessment: post-procedure vital signs reviewed and stable Respiratory status: spontaneous breathing, nonlabored ventilation and respiratory function stable Cardiovascular status: blood pressure returned to baseline and stable Postop Assessment: no apparent nausea or vomiting Anesthetic complications: no    Last Vitals:  Vitals:   08/30/18 0400 08/30/18 0500  BP: (!) 143/76 (!) 148/78  Pulse:    Resp: 13 18  Temp: 37.4 C   SpO2: 91% 100%    Last Pain:  Vitals:   08/30/18 0400  TempSrc: Oral  PainSc: Asleep                 Deyana Wnuk,W. EDMOND

## 2018-08-30 NOTE — Progress Notes (Signed)
Subjective: Pt sitting comfortably in chair. No new complaint.  Objective: Vital signs in last 24 hours: Temp:  [98.6 F (37 C)-99.8 F (37.7 C)] 98.9 F (37.2 C) (01/20 0732) Pulse Rate:  [94] 94 (01/20 0800) Resp:  [0-31] 26 (01/20 0800) BP: (115-164)/(63-96) 115/63 (01/20 0800) SpO2:  [87 %-100 %] 99 % (01/20 0800) Weight:  [100 kg] 100 kg (01/20 0500)  Physical Exam: Head: Normocephalic, atraumatic Eyes: His pupils are equal, round, reactive to light. Extraocular motion is intact.  Ears: Examination of the ears shows normal auricles and external auditory canals bilaterally.  Nose: Nasal examination shows normal mucosa, septum, turbinates.  Face: Facial examination shows no asymmetry. Palpation of the face elicit no significant tenderness.  Mouth: Oral cavity examination shows no mucosalabnormality. No significant trismus is noted.  Neck/chest:The left neck induration has significantly improved. The left chest VAC system is in place. Chest: Normal respiratory rhythm. No stridor or SOB.  Recent Labs    08/29/18 0242 08/30/18 0228  WBC 10.5 9.1  HGB 7.2* 7.4*  HCT 23.3* 23.4*  PLT 487* 474*   Recent Labs    08/29/18 0242 08/29/18 2147 08/30/18 0228  NA 134*  --  131*  K 4.2  --  4.2  CL 104  --  102  CO2 22  --  21*  GLUCOSE 125* 79 208*  BUN 19  --  18  CREATININE 1.48*  --  1.66*  CALCIUM 7.6*  --  7.5*    Medications:  I have reviewed the patient's current medications. Scheduled: . sodium chloride   Intravenous Once  . amLODipine  10 mg Oral Daily  . atorvastatin  10 mg Oral Daily  . docusate sodium  100 mg Oral BID  . feeding supplement (PRO-STAT SUGAR FREE 64)  30 mL Oral BID  . insulin aspart  0-15 Units Subcutaneous TID WC  . insulin aspart  0-5 Units Subcutaneous QHS  . insulin aspart  5 Units Subcutaneous TID WC  . insulin detemir  40 Units Subcutaneous Daily  . nystatin   Topical BID  . polyethylene glycol  17 g Oral Daily    Continuous: . sodium chloride 10 mL/hr at 08/30/18 0800  . pencillin G potassium IV 4 Million Units (08/30/18 0510)    Assessment/Plan: Septicsternoclavicular arthritis, now s/p incision and debridement by Dr. Cyndia Bent. - The left neck swelling has significantly improved after the surgery. The WBC has normalized. - IV abx per ID recommendation. - Will sign off. Please call for any ENT questions or concerns.   LOS: 13 days   Adis Sturgill W Malania Gawthrop 08/30/2018, 9:05 AM

## 2018-08-30 NOTE — Anesthesia Postprocedure Evaluation (Signed)
Anesthesia Post Note  Patient: Randy Oneal  Procedure(s) Performed: WOUND VAC CHANGE, LEFT CHEST AND NECK, POSSIBLE DEBRIDEMENT (Left ) APPLICATION OF WOUND VAC (Left )     Patient location during evaluation: PACU Anesthesia Type: General Level of consciousness: awake and alert Pain management: pain level controlled Vital Signs Assessment: post-procedure vital signs reviewed and stable Respiratory status: spontaneous breathing, nonlabored ventilation, respiratory function stable and patient connected to nasal cannula oxygen Cardiovascular status: blood pressure returned to baseline and stable Postop Assessment: no apparent nausea or vomiting Anesthetic complications: no    Last Vitals:  Vitals:   08/30/18 1734 08/30/18 1800  BP: (!) 157/90 (!) 161/92  Pulse: 94   Resp: 18 15  Temp:    SpO2: 92% 92%    Last Pain:  Vitals:   08/30/18 1734  TempSrc:   PainSc: 0-No pain                 Tiajuana Amass

## 2018-08-30 NOTE — Anesthesia Procedure Notes (Signed)
Procedure Name: Intubation Date/Time: 08/30/2018 2:46 PM Performed by: Cleda Daub, CRNA Pre-anesthesia Checklist: Patient identified, Emergency Drugs available, Suction available and Patient being monitored Patient Re-evaluated:Patient Re-evaluated prior to induction Oxygen Delivery Method: Circle system utilized Preoxygenation: Pre-oxygenation with 100% oxygen Induction Type: IV induction Ventilation: Mask ventilation without difficulty and Mask ventilation throughout procedure Laryngoscope Size: Mac and 3 Grade View: Grade II Tube type: Oral Tube size: 8.0 mm Number of attempts: 1 Airway Equipment and Method: Stylet Placement Confirmation: ETT inserted through vocal cords under direct vision,  positive ETCO2 and breath sounds checked- equal and bilateral Secured at: 23 cm Tube secured with: Tape Dental Injury: Injury to lip  Comments: A small upper lip tear; ointment applied.

## 2018-08-30 NOTE — Anesthesia Preprocedure Evaluation (Signed)
Anesthesia Evaluation  Patient identified by MRN, date of birth, ID band Patient awake    Reviewed: Allergy & Precautions, NPO status , Patient's Chart, lab work & pertinent test results  History of Anesthesia Complications Negative for: history of anesthetic complications  Airway Mallampati: III  TM Distance: >3 FB Neck ROM: Full    Dental  (+) Teeth Intact   Pulmonary neg pulmonary ROS,    breath sounds clear to auscultation       Cardiovascular hypertension, Pt. on medications (-) angina(-) Past MI and (-) CHF  Rhythm:Regular     Neuro/Psych negative neurological ROS  negative psych ROS   GI/Hepatic negative GI ROS, Neg liver ROS,   Endo/Other  diabetes, Type 2, Insulin Dependent  Renal/GU Renal disease     Musculoskeletal   Abdominal   Peds  Hematology  (+) anemia ,   Anesthesia Other Findings   Reproductive/Obstetrics                             Lab Results  Component Value Date   WBC 9.1 08/30/2018   HGB 7.4 (L) 08/30/2018   HCT 23.4 (L) 08/30/2018   MCV 81.8 08/30/2018   PLT 474 (H) 08/30/2018   Lab Results  Component Value Date   CREATININE 1.66 (H) 08/30/2018   BUN 18 08/30/2018   NA 131 (L) 08/30/2018   K 4.2 08/30/2018   CL 102 08/30/2018   CO2 21 (L) 08/30/2018    Anesthesia Physical  Anesthesia Plan  ASA: III  Anesthesia Plan: General   Post-op Pain Management:    Induction: Intravenous  PONV Risk Score and Plan: 2 and Ondansetron and Midazolam  Airway Management Planned: LMA and Oral ETT  Additional Equipment: None  Intra-op Plan:   Post-operative Plan: Extubation in OR  Informed Consent: I have reviewed the patients History and Physical, chart, labs and discussed the procedure including the risks, benefits and alternatives for the proposed anesthesia with the patient or authorized representative who has indicated his/her understanding and  acceptance.     Dental advisory given  Plan Discussed with: CRNA and Surgeon  Anesthesia Plan Comments:         Anesthesia Quick Evaluation

## 2018-08-30 NOTE — Transfer of Care (Signed)
Immediate Anesthesia Transfer of Care Note  Patient: Randy Oneal  Procedure(s) Performed: WOUND VAC CHANGE, LEFT CHEST AND NECK, POSSIBLE DEBRIDEMENT (Left ) APPLICATION OF WOUND VAC (Left )  Patient Location: PACU  Anesthesia Type:General  Level of Consciousness: awake, alert , oriented and patient cooperative  Airway & Oxygen Therapy: Patient Spontanous Breathing and Patient connected to face mask oxygen  Post-op Assessment: Report given to RN and Post -op Vital signs reviewed and stable  Post vital signs: Reviewed and stable  Last Vitals:  Vitals Value Taken Time  BP 138/81 08/30/2018  4:03 PM  Temp    Pulse 95 08/30/2018  4:05 PM  Resp 14 08/30/2018  4:05 PM  SpO2 94 % 08/30/2018  4:05 PM  Vitals shown include unvalidated device data.  Last Pain:  Vitals:   08/30/18 1200  TempSrc:   PainSc: 0-No pain      Patients Stated Pain Goal: 0 (81/15/72 6203)  Complications: No apparent anesthesia complications

## 2018-08-30 NOTE — Progress Notes (Signed)
Patient ID: Randy Oneal, male   DOB: 30-May-1966, 53 y.o.   MRN: [id number] EVENING ROUNDS NOTE :     [address]       [place] [address]             [phone]                 Day of Surgery Procedure(s) (LRB): WOUND VAC CHANGE, LEFT CHEST AND NECK, POSSIBLE DEBRIDEMENT (Left) APPLICATION OF WOUND VAC (Left)  Total Length of Stay:  LOS: 13 days  BP (!) 155/94   Pulse 94   Temp 97.9 F (36.6 C)   Resp 16   Ht 5\' 10"  (1.778 m)   Wt 100 kg   SpO2 93%   BMI 31.63 kg/m   .Intake/Output      01/19 0701 - 01/20 0700 01/20 0701 - 01/21 0700   P.O. 480    I.V. (mL/kg) 155.7 (1.6) 731.4 (7.3)   IV Piggyback 1750 250   Total Intake(mL/kg) 2385.7 (23.9) 981.4 (9.8)   Urine (mL/kg/hr) 1100 (0.5) 0 (0)   Drains 100 0   Stool 0 0   Blood  0   Total Output 1200 0   Net +1185.7 +981.4        Urine Occurrence 3 x 1 x   Stool Occurrence 2 x 1 x     . sodium chloride 10 mL/hr at 08/30/18 1200  . lactated ringers 10 mL/hr at 08/30/18 1410  . pencillin G potassium IV 4 Million Units (08/30/18 0926)     Lab Results  Component Value Date   WBC 9.1 08/30/2018   HGB 7.4 (L) 08/30/2018   HCT 23.4 (L) 08/30/2018   PLT 474 (H) 08/30/2018   GLUCOSE 208 (H) 08/30/2018   CHOL 183 02/04/2018   TRIG 62 02/04/2018   HDL 65 02/04/2018   LDLCALC 106 (H) 02/04/2018   ALT 41 08/18/2018   AST 71 (H) 08/18/2018   NA 131 (L) 08/30/2018   K 4.2 08/30/2018   CL 102 08/30/2018   CREATININE 1.66 (H) 08/30/2018   BUN 18 08/30/2018   CO2 21 (L) 08/30/2018   TSH 1.670 02/04/2018   INR 1.01 08/17/2018   HGBA1C 13.0 (A) 08/16/2018   MICROALBUR 24.5 11/05/2015   Vac functioning    Grace Isaac MD  Beeper [phone] Office [phone] 08/30/2018 5:28 PM

## 2018-08-30 NOTE — Brief Op Note (Signed)
08/30/2018  3:24 PM  PATIENT:  [name] III  53 y.o. male  PRE-OPERATIVE DIAGNOSIS:  left sternoclavicular joint infection  POST-OPERATIVE DIAGNOSIS:  left sternoclavicular joint infection  PROCEDURE:  Procedure(s): WOUND VAC CHANGE, LEFT CHEST AND NECK, POSSIBLE DEBRIDEMENT (Left) APPLICATION OF WOUND VAC (Left)  SURGEON:  Surgeon(s) and Role:    * Bartle, Fernande Boyden, MD - Primary  PHYSICIAN ASSISTANT: none   ANESTHESIA:   general  EBL:  Minimal   BLOOD ADMINISTERED:none  DRAINS: VAC sponge in wound  LOCAL MEDICATIONS USED:  NONE  SPECIMEN:  No Specimen  DISPOSITION OF SPECIMEN:  N/A  COUNTS:  YES  TOURNIQUET:  * No tourniquets in log *  DICTATION: .Note written in EPIC  PLAN OF CARE: Admit to inpatient   PATIENT DISPOSITION:  PACU - hemodynamically stable.   Delay start of Pharmacological VTE agent (>24hrs) due to surgical blood loss or risk of bleeding: yes

## 2018-08-30 NOTE — Op Note (Signed)
CARDIOVASCULAR SURGERY OPERATIVE NOTE  08/30/2018  Surgeon:  Gaye Pollack, MD  First Assistant: none   Preoperative Diagnosis:  Left sternoclavicular joint infection with extension of the abscess into the left neck and mediastinum  Postoperative Diagnosis: Left sternoclavicular joint infection with extension of the abscess into the left neck and mediastinum   Procedure:  1.  Wound VAC change under anesthesia  Anesthesia:  General Endotracheal   Clinical History/Surgical Indication:  The patient is a 53 year old gentleman with history of hypertension and diabetes who presented with GBS bacteremia and sepsis with DKA related to an infected right great toe. He was subsequently diagnosed with an abscess over the left sternoclavicular joint extending into the base of the left neck and mediastinum. He underwent incision and drainage in the OR on 08/11/2018 which revealed that the infection appeared to originated from infection of the left sternoclavicular joint. The joint was debrided and the neck and mediastinal abscess drained. A wound VAC was placed and he has done well with resolution of sepsis. I felt that it would be best to return to the operating room under anesthesia to do the first wound VAC change so that if further debridement was necessary it can be done at that time.  I discussed the operative procedure of wound VAC change and possible debridement of the wound  with him and his wife including the benefits and risk including but not limited to bleeding, blood transfusion, injury to neck structures, need for recurrent dressing changes and debridements, and the possibility that he may have an open wound that will take time to close on its own. He understands all this and agrees to proceed.   Preparation:  The patient was seen in the preoperative holding area and the correct patient, correct operation were confirmed with the patient after reviewing the medical record.  The consent was signed by me.  He was already on intravenous antibiotics for group B strep bacteremia.  The patient was taken back to the operating room and positioned supine on the operating room table. After being placed under general endotracheal anesthesia by the anesthesia team  the wound VAC sponge was removed from the wound and the neck and chest were prepped with betadine soap and solution and draped in the usual sterile manner. A surgical time-out was taken and the correct patient and operative procedure were confirmed with the nursing and anesthesia staff.   Wound VAC change:  The wound was thoroughly examined.  There was minimal fibrinous debris present and this was easily removed.  The head of the clavicle and the manubrium appeared intact without any obvious infection.  The bone appeared hard.  There is complete hemostasis.  A small sponge was cut into an elliptical shape.  The remaining side of the sponge that was removed was used to pack the deeper area down into the left sternoclavicular joint as well as into the base of the neck.  The elliptical piece of sponge was then used to pack the more superficial wound.  This was covered with the included occlusive dressing and vacuum at 75 mmHg was applied.  There did not appear to be any air leak.  There was complete hemostasis.  The patient tolerated procedure well and was then extubated and transported to the post-anesthesia care unit in satisfactory condition.

## 2018-08-30 NOTE — Progress Notes (Signed)
PROGRESS NOTE    Randy Oneal  LAG:[id number] DOB: July 13, 1966 DOA: 08/16/2018 PCP: [name], MD    Brief Narrative:  53 year old male who presented with uncontrolled hyperglycemia. He does have significant past medical history for type 2 diabetes mellitus, hypertension and chronic kidney disease. Patient was sent to the hospital by his primary care provider due to altered mentation. He was not able to give any detailed history. Per his wife he had significant polyuria and polydipsia,with visual hallucinations for the last 2 days. On his initial physical examination temperature 98.6, heart rate 114, respirate 18, blood pressure 136/104, oxygen saturation 99%. Dry mucous membranes, heart S1-S2 present, tachycardic, lungs clear to auscultation bilaterally, abdomen soft nontender, no lower extremity edema. Positive right foot diabetic ulcer. Patient was disoriented and weak.Sodium 121, potassium 4.1, chloride 82, bicarb 24, glucose 432, BUN 67, creatinine 2.7, 0.8, hemoglobin 14.1, hematocrit 43.0, platelets 144.  Patient was admitted to the hospital working diagnosis of hyperosmolar nonketotic state,complicated with right foot diabetic ulcer.  During his hospitalization it was noted to have a left upper chest induration, further testing revealed a abscess, patient transferred to [facility] for ENT and CT surgery evaluation.    Assessment & Plan:   Principal Problem:   AKI (acute kidney injury) ([place]) Active Problems:   Type 2 diabetes mellitus not at goal [facility])   HTN (hypertension), benign   Personal history of noncompliance with medical treatment, presenting hazards to health   Hyperlipidemia associated with type 2 diabetes mellitus ([place])   Hyponatremia   Diabetic hyperosmolar non-ketotic state ([place])   Hyperglycemia   Cellulitis of great toe, right   Mediastinal abscess ([place])   Open wound of right great toe   Septic arthritis of left sternoclavicular joint ([place])   1.  Sepsis due tosupraclavicular region multilocular abscess formation,due tostrep B bacteremia,(present on admission). Patient is sp debridement and wound vac application to left chest.  On IV antibiotic with IV penicillin, will need a picc line before discharge. Wbc trending down, today at 9,1/. Patient has been afebrile, for wound check today per surgery.   2. T2dm with uncontrolled hyperglycemia.Patient continue to have hypoglycemic episodes, will decrease long acting insulin to 30 units, and will continue pre-meal insulin plus insulin sliding scale. Continue glucose monitoring. Patient today NPO for procedure, watch for hypoglycemia.   3. HTN.On amlodipine, for blood pressure control.   4. AKI on CKD stage 3, with hypokalemia and hyponatremia. Fluctuating serum cr, today at 1,66 from 1,48, K at 4,2 and serum bicarbonate at 21. Will continue to follow on renal function and electrolytes. Patient has been tolerating po well.   5. Dyslipidemia. On atorvastatin.  6. Obesity. BMI 30. Follow up needed.  7. Right toe infection. With local wound care.  DVT prophylaxis:enoxaparin Code Status:full Family Communication:no family at the bedside Disposition Plan/ discharge barriers:Attending is Dr. Cyndia Bent, I will continue to assist in diabetic management and renal function monitoring while hospitalized.  Body mass index is 31.63 kg/m. Malnutrition Type:  Nutrition Problem: Food and nutrition related knowledge deficit Etiology: limited prior education, wound healing   Malnutrition Characteristics:  Signs/Symptoms: per patient/family report   Nutrition Interventions:  Interventions: Education, MVI, Snacks  RN Pressure Injury Documentation:     Consultants:     Procedures:     Antimicrobials:   Penicillin     Subjective: Patient has been npo after midnight, no nausea or vomiting, no chest pain, or dyspnea. Pain at the surgical wound has been  controlled.  Objective: Vitals:   08/30/18 1100 08/30/18 1141 08/30/18 1156 08/30/18 1200  BP:  (!) 154/84  (!) 156/84  Pulse:    95  Resp: 17 (!) 24  16  Temp:   98.7 F (37.1 C)   TempSrc:   Oral   SpO2: 99% 100%  99%  Weight:      Height:        Intake/Output Summary (Last 24 hours) at 08/30/2018 1249 Last data filed at 08/30/2018 1200 Gross per 24 hour  Intake 2127.25 ml  Output 650 ml  Net 1477.25 ml   Filed Weights   08/27/18 1321 08/29/18 0213 08/30/18 0500  Weight: 100.4 kg 102.2 kg 100 kg    Examination:   General: deconditioned  Neurology: Awake and alert, non focal  E ENT: mild pallor, no icterus, oral mucosa moist Cardiovascular: No JVD. S1-S2 present, rhythmic, no gallops, rubs, or murmurs. No lower extremity edema. Pulmonary: positive breath sounds bilaterally, adequate air movement, no wheezing, rhonchi or rales. Gastrointestinal. Abdomen with no organomegaly, non tender, no rebound or guarding Skin. Left upper chest with wound vac in place.  Musculoskeletal: no joint deformities     Data Reviewed: I have personally reviewed following labs and imaging studies  CBC: Recent Labs  Lab 08/24/18 0839 08/27/18 0253 08/28/18 0257 08/29/18 0242 08/30/18 0228  WBC 20.6* 14.3* 13.1* 10.5 9.1  NEUTROABS  --  12.2* 11.9* 8.3* 7.1  HGB 9.8* 8.8* 8.8* 7.2* 7.4*  HCT 30.8* 28.3* 27.7* 23.3* 23.4*  MCV 80.6 80.2 81.2 80.9 81.8  PLT 477* 602* 624* 487* 119*   Basic Metabolic Panel: Recent Labs  Lab 08/24/18 0839 08/27/18 0253 08/28/18 0257 08/29/18 0242 08/29/18 2147 08/30/18 0228  NA 133* 136 134* 134*  --  131*  K 4.6 4.6 4.6 4.2  --  4.2  CL 106 108 105 104  --  102  CO2 21* 22 20* 22  --  21*  GLUCOSE 110* 156* 236* 125* 79 208*  BUN 26* 24* 20 19  --  18  CREATININE 1.45* 1.73* 1.53* 1.48*  --  1.66*  CALCIUM 7.6* 7.8* 7.8* 7.6*  --  7.5*   GFR: Estimated Creatinine Clearance: 61.7 mL/min (A) (by C-G formula based on SCr of 1.66 mg/dL  (H)). Liver Function Tests: No results for input(s): AST, ALT, ALKPHOS, BILITOT, PROT, ALBUMIN in the last 168 hours. No results for input(s): LIPASE, AMYLASE in the last 168 hours. No results for input(s): AMMONIA in the last 168 hours. Coagulation Profile: No results for input(s): INR, PROTIME in the last 168 hours. Cardiac Enzymes: No results for input(s): CKTOTAL, CKMB, CKMBINDEX, TROPONINI in the last 168 hours. BNP (last 3 results) No results for input(s): PROBNP in the last 8760 hours. HbA1C: No results for input(s): HGBA1C in the last 72 hours. CBG: Recent Labs  Lab 08/29/18 2058 08/29/18 2117 08/29/18 2135 08/30/18 0655 08/30/18 1154  GLUCAP 50* 46* 65* 236* 206*   Lipid Profile: No results for input(s): CHOL, HDL, LDLCALC, TRIG, CHOLHDL, LDLDIRECT in the last 72 hours. Thyroid Function Tests: No results for input(s): TSH, T4TOTAL, FREET4, T3FREE, THYROIDAB in the last 72 hours. Anemia Panel: No results for input(s): VITAMINB12, FOLATE, FERRITIN, TIBC, IRON, RETICCTPCT in the last 72 hours.    Radiology Studies: I have reviewed all of the imaging during this hospital visit personally     Scheduled Meds: . sodium chloride   Intravenous Once  . amLODipine  10 mg Oral Daily  . atorvastatin  10 mg Oral Daily  . docusate sodium  100 mg Oral BID  . feeding supplement (PRO-STAT SUGAR FREE 64)  30 mL Oral BID  . insulin aspart  0-15 Units Subcutaneous TID WC  . insulin aspart  0-5 Units Subcutaneous QHS  . insulin aspart  5 Units Subcutaneous TID WC  . [START ON 08/31/2018] insulin detemir  30 Units Subcutaneous Daily  . nystatin   Topical BID  . polyethylene glycol  17 g Oral Daily   Continuous Infusions: . sodium chloride 10 mL/hr at 08/30/18 1200  . pencillin G potassium IV 4 Million Units (08/30/18 1314)     LOS: 13 days        Letty Salvi Gerome Apley, MD [facility] Pager [phone]

## 2018-08-31 ENCOUNTER — Inpatient Hospital Stay (HOSPITAL_COMMUNITY): Payer: Managed Care, Other (non HMO)

## 2018-08-31 ENCOUNTER — Inpatient Hospital Stay: Payer: Self-pay

## 2018-08-31 ENCOUNTER — Encounter (HOSPITAL_COMMUNITY): Payer: Self-pay | Admitting: Surgery

## 2018-08-31 DIAGNOSIS — M7989 Other specified soft tissue disorders: Secondary | ICD-10-CM

## 2018-08-31 LAB — GLUCOSE, CAPILLARY
GLUCOSE-CAPILLARY: 80 mg/dL (ref 70–99)
Glucose-Capillary: 221 mg/dL — ABNORMAL HIGH (ref 70–99)
Glucose-Capillary: 209 mg/dL — ABNORMAL HIGH (ref 70–99)
Glucose-Capillary: 192 mg/dL — ABNORMAL HIGH (ref 70–99)
Glucose-Capillary: 151 mg/dL — ABNORMAL HIGH (ref 70–99)

## 2018-08-31 LAB — CBC WITH DIFFERENTIAL/PLATELET
Abs Immature Granulocytes: 0.06 10*3/uL (ref 0.00–0.07)
Basophils Absolute: 0.1 10*3/uL (ref 0.0–0.1)
Basophils Relative: 1 %
EOS ABS: 0.1 10*3/uL (ref 0.0–0.5)
Eosinophils Relative: 1 %
HCT: 23.8 % — ABNORMAL LOW (ref 39.0–52.0)
Hemoglobin: 7.3 g/dL — ABNORMAL LOW (ref 13.0–17.0)
IMMATURE GRANULOCYTES: 1 %
Lymphocytes Relative: 12 %
Lymphs Abs: 1.1 10*3/uL (ref 0.7–4.0)
MCH: 24.7 pg — ABNORMAL LOW (ref 26.0–34.0)
MCHC: 30.7 g/dL (ref 30.0–36.0)
MCV: 80.7 fL (ref 80.0–100.0)
Monocytes Absolute: 0.6 10*3/uL (ref 0.1–1.0)
Monocytes Relative: 7 %
NEUTROS PCT: 78 %
Neutro Abs: 7 10*3/uL (ref 1.7–7.7)
Platelets: 424 10*3/uL — ABNORMAL HIGH (ref 150–400)
RBC: 2.95 MIL/uL — ABNORMAL LOW (ref 4.22–5.81)
RDW: 14.2 % (ref 11.5–15.5)
WBC: 8.9 10*3/uL (ref 4.0–10.5)
nRBC: 0 % (ref 0.0–0.2)

## 2018-08-31 LAB — BASIC METABOLIC PANEL
Anion gap: 6 (ref 5–15)
BUN: 14 mg/dL (ref 6–20)
CO2: 22 mmol/L (ref 22–32)
Calcium: 7.6 mg/dL — ABNORMAL LOW (ref 8.9–10.3)
Chloride: 101 mmol/L (ref 98–111)
Creatinine, Ser: 1.54 mg/dL — ABNORMAL HIGH (ref 0.61–1.24)
GFR calc Af Amer: 59 mL/min — ABNORMAL LOW (ref >60)
GFR calc non Af Amer: 51 mL/min — ABNORMAL LOW (ref >60)
Glucose, Bld: 228 mg/dL — ABNORMAL HIGH (ref 70–99)
Potassium: 4.5 mmol/L (ref 3.5–5.1)
Sodium: 129 mmol/L — ABNORMAL LOW (ref 135–145)

## 2018-08-31 MED ORDER — POTASSIUM CHLORIDE CRYS ER 20 MEQ PO TBCR
20.0000 meq | EXTENDED_RELEASE_TABLET | Freq: Once | ORAL | Status: AC
  Administered 2018-08-31: 20 meq via ORAL
  Filled 2018-08-31: qty 1

## 2018-08-31 MED ORDER — FUROSEMIDE 10 MG/ML IJ SOLN
40.0000 mg | Freq: Once | INTRAMUSCULAR | Status: AC
  Administered 2018-08-31: 40 mg via INTRAVENOUS
  Filled 2018-08-31: qty 4

## 2018-08-31 MED ORDER — FE FUMARATE-B12-VIT C-FA-IFC PO CAPS
1.0000 | ORAL_CAPSULE | Freq: Three times a day (TID) | ORAL | Status: DC
  Administered 2018-08-31: 1 via ORAL
  Filled 2018-08-31: qty 1

## 2018-08-31 NOTE — Progress Notes (Addendum)
TCTS DAILY ICU PROGRESS NOTE                   [address]            [place] [address]          [phone]   1 Day Post-Op Procedure(s) (LRB): WOUND VAC CHANGE, LEFT CHEST AND NECK, POSSIBLE DEBRIDEMENT (Left) APPLICATION OF WOUND VAC (Left)  Total Length of Stay:  LOS: 14 days   Subjective:  No new complaints.  States he continues to have pain at his infection site, but he states he feels like he is getting better.  He states he is ambulating but states it has been more difficult as his legs are swollen.  Objective: Vital signs in last 24 hours: Temp:  [97.9 F (36.6 C)-100.7 F (38.2 C)] 99.1 F (37.3 C) (01/21 0732) Pulse Rate:  [93-95] 94 (01/20 1734) Cardiac Rhythm: Normal sinus rhythm (01/21 0800) Resp:  [8-23] 15 (01/21 1100) BP: (109-169)/(77-96) 155/94 (01/21 1100) SpO2:  [89 %-100 %] 97 % (01/21 1100) Weight:  [99 kg] 99 kg (01/21 0337)  Filed Weights   08/29/18 0213 08/30/18 0500 08/31/18 0337  Weight: 102.2 kg 100 kg 99 kg    Weight change: -1.025 kg   Intake/Output from previous day: 01/20 0701 - 01/21 0700 In: 1997.1 [I.V.:747.1; IV Piggyback:1250] Out: 2 [Urine:1500; Drains:65]  Intake/Output this shift: Total I/O In: 240 [P.O.:240] Out: -   Current Meds: Scheduled Meds: . sodium chloride   Intravenous Once  . amLODipine  10 mg Oral Daily  . atorvastatin  10 mg Oral Daily  . docusate sodium  100 mg Oral BID  . feeding supplement (PRO-STAT SUGAR FREE 64)  30 mL Oral BID  . insulin aspart  0-15 Units Subcutaneous TID WC  . insulin aspart  0-5 Units Subcutaneous QHS  . insulin aspart  5 Units Subcutaneous TID WC  . insulin detemir  30 Units Subcutaneous Daily  . nystatin   Topical BID  . polyethylene glycol  17 g Oral Daily   Continuous Infusions: . sodium chloride Stopped (08/30/18 1334)  . pencillin G potassium IV 4 Million Units (08/31/18 1000)   PRN Meds:.acetaminophen **OR** [DISCONTINUED] acetaminophen,  fentaNYL (SUBLIMAZE) injection, labetalol, ondansetron **OR** ondansetron (ZOFRAN) IV, oxyCODONE  General appearance: alert, cooperative and no distress Heart: regular rate and rhythm Lungs: clear to auscultation bilaterally Abdomen: soft, non-tender; bowel sounds normal; no masses,  no organomegaly Extremities: edema + bilaterally Wound: wound vac in place, no surrounding erythema, drainage present in canister  Lab Results: CBC: Recent Labs    08/30/18 0228 08/31/18 0349  WBC 9.1 8.9  HGB 7.4* 7.3*  HCT 23.4* 23.8*  PLT 474* 424*   BMET:  Recent Labs    08/30/18 0228 08/31/18 0349  NA 131* 129*  K 4.2 4.5  CL 102 101  CO2 21* 22  GLUCOSE 208* 228*  BUN 18 14  CREATININE 1.66* 1.54*  CALCIUM 7.5* 7.6*    CMET: Lab Results  Component Value Date   WBC 8.9 08/31/2018   HGB 7.3 (L) 08/31/2018   HCT 23.8 (L) 08/31/2018   PLT 424 (H) 08/31/2018   GLUCOSE 228 (H) 08/31/2018   CHOL 183 02/04/2018   TRIG 62 02/04/2018   HDL 65 02/04/2018   LDLCALC 106 (H) 02/04/2018   ALT 41 08/18/2018   AST 71 (H) 08/18/2018   NA 129 (L) 08/31/2018   K 4.5 08/31/2018   CL 101 08/31/2018  CREATININE 1.54 (H) 08/31/2018   BUN 14 08/31/2018   CO2 22 08/31/2018   TSH 1.670 02/04/2018   INR 1.01 08/17/2018   HGBA1C 13.0 (A) 08/16/2018   MICROALBUR 24.5 11/05/2015      PT/INR: No results for input(s): LABPROT, INR in the last 72 hours. Radiology: No results found.   Assessment/Plan: S/P Procedure(s) (LRB): WOUND VAC CHANGE, LEFT CHEST AND NECK, POSSIBLE DEBRIDEMENT (Left) APPLICATION OF WOUND VAC (Left)  1. CV- hemodynamically stable, mild tachycardia persist, BP controlled on home Norvasc 2. ID- low grade temp, leukocytosis improving, continue ABX per ID 3. Renal- creatinine improved at 1.54, edematous on exam, will give IV lasix today, supplement 4. DM-sugars are elevated at time, continue current regimen, he had some issues with hypoglycemia, will monitor if sugars  remain elevated may need titration of insulin 5. Dispo- patient stable, low grade temp on ABX, wound vac due to be changed tomorrow, unsure if at bedside vs. OR, will give dose of Lasix today for LE edema, watch blood sugars, continue current care     Ellwood Handler 08/31/2018 12:47 PM    Chart reviewed, patient examined, agree with above. He feels well. Tmax 100.7 last pm. WBC back to normal. Continue antibiotic per ID. Will have PICC line inserted. WOC consult for wound VAC change.  He has some swelling of the LUL compared to right. This could be from the left sided surgery but he also had pus around the innominate vein and Left IJ vein and could have a DVT. Will get a venous duplex to evaluate.  He is edematous and hyponatremic. Continue diuresis.  Anemia is stable with Hgb of 7.3. Will start Trinsicon.

## 2018-08-31 NOTE — Progress Notes (Addendum)
Initial Nutrition Assessment  DOCUMENTATION CODES:   Obesity unspecified  INTERVENTION:    Glucerna Shake po BID, each supplement provides 220 kcal and 10 grams of protein  Prostat liquid protein po 30 ml BID with meals, each supplement provides 100 kcal, 15 grams protein  NUTRITION DIAGNOSIS:   Increased nutrient needs related to acute illness, chronic illness, wound healing as evidenced by estimated needs  GOAL:   Patient will meet greater than or equal to 90% of their needs  MONITOR:   PO intake, Supplement acceptance, Labs, Skin, Weight trends, I & O's  REASON FOR ASSESSMENT:   LOS(ICU)  ASSESSMENT:   53 y.o. Male with poorly controlled DM, HTN and CKD; initially presented to [facility] with AMS and found to have group B strep bacteremia.  He did have pain in his chest and neck and was found to have an abscess and likely septic arthritis of the sternoclavicular joint.  He was sent to [facility] for further surgical evaluation.   1/14 transferred from [facility] to [facility] 1/17 s/p I&D L chest, neck & mediastinum, application of wound VAC 1/20 wound VAC change  RD spoke with pt; he is resting in his recliner. Reports his appetite is good; enjoyed his breakfast. He doesn't particularly like Prostat liquid protein but is taking it.  Pt reveals his PO intake wasn't very good PTA to [facility]. Shares he feels he's been doing better and gaining weight back. Labs & medications reviewed. CBG's [phone].  Pt not meeting his estimated kcal, protein needs with current PO diet. Receiving Prostat BID; would benefit from additional supplement. Spoke with Thayer Headings, RN.  NUTRITION - FOCUSED PHYSICAL EXAM:    Most Recent Value  Orbital Region  No depletion  Upper Arm Region  No depletion  Thoracic and Lumbar Region  No depletion  Buccal Region  No depletion  Temple Region  No depletion  Clavicle Bone Region  No depletion  Clavicle and Acromion Bone Region  No depletion  Scapular Bone Region   No depletion  Dorsal Hand  No depletion  Patellar Region  No depletion  Anterior Thigh Region  No depletion  Posterior Calf Region  No depletion  Edema (RD Assessment)  Mild to moderate (lower extremities)     Diet Order:   Diet Order            Diet heart healthy/carb modified Room service appropriate? Yes; Fluid consistency: Thin  Diet effective now             EDUCATION NEEDS:   Education needs have been addressed  Skin:  Skin Assessment: Skin Integrity Issues: Skin Integrity Issues:: Other (Comment), Wound VAC Wound Vac: L neck and mediastinum Other: full thickness wound to plantar surface of the R great toe  Last BM:  1/20   Intake/Output Summary (Last 24 hours) at 08/31/2018 1209 Last data filed at 08/31/2018 0800 Gross per 24 hour  Intake 1955.75 ml  Output 1565 ml  Net 390.75 ml   Height:   Ht Readings from Last 1 Encounters:  08/27/18 5\' 10"  (1.778 m)   Weight:   Wt Readings from Last 1 Encounters:  08/31/18 99 kg   Ideal Body Weight:  75 kg  BMI:  Body mass index is 31.31 kg/m.  Estimated Nutritional Needs:   Kcal:  2200-2300  Protein:  125-140 gm  Fluid:  per MD  Arthur Holms, RD, LDN Pager #: [phone] After-Hours Pager #: [phone]

## 2018-08-31 NOTE — Progress Notes (Signed)
Left upper extremity venous duplex has been completed.   Preliminary results in CV Proc.   Abram Sander 08/31/2018 3:14 PM

## 2018-08-31 NOTE — Progress Notes (Signed)
PROGRESS NOTE    Randy Oneal  YSA:[id number] DOB: 1966-03-09 DOA: 08/16/2018 PCP: [name], MD    Brief Narrative:  53 year old male who presented with uncontrolled hyperglycemia. He does have significant past medical history for type 2 diabetes mellitus, hypertension and chronic kidney disease. Patient was sent to the hospital by his primary care provider due to altered mentation. He was not able to give any detailed history. Per his wife he had significant polyuria and polydipsia,with visual hallucinations for the last 2 days. On his initial physical examination temperature 98.6, heart rate 114, respirate 18, blood pressure 136/104, oxygen saturation 99%. Dry mucous membranes, heart S1-S2 present, tachycardic, lungs clear to auscultation bilaterally, abdomen soft nontender, no lower extremity edema. Positive right foot diabetic ulcer. Patient was disoriented and weak.Sodium 121, potassium 4.1, chloride 82, bicarb 24, glucose 432, BUN 67, creatinine 2.7, 0.8, hemoglobin 14.1, hematocrit 43.0, platelets 144.  Patient was admitted to the hospital working diagnosis of hyperosmolar nonketotic state,complicated withright foot diabetic ulcer.  During his hospitalization it was noted to have a left upper chest induration, further testing revealed a abscess, patient transferred to [facility] for ENT and CT surgery evaluation.  Patient had his left upper chest abscess I&D per cardiothoracic surgery.    Assessment & Plan:   Principal Problem:   AKI (acute kidney injury) ([place]) Active Problems:   Type 2 diabetes mellitus not at goal [facility])   HTN (hypertension), benign   Personal history of noncompliance with medical treatment, presenting hazards to health   Hyperlipidemia associated with type 2 diabetes mellitus ([place])   Hyponatremia   Diabetic hyperosmolar non-ketotic state ([place])   Hyperglycemia   Cellulitis of great toe, right   Mediastinal abscess ([place])   Open wound of right  great toe   Septic arthritis of left sternoclavicular joint ([place])  1. Sepsis due tosupraclavicular region multilocular abscess formation,due tostrep B bacteremia,(present on admission). Patient is sp debridement and wound vac application to left chest. Continue withIV antibiotic with penicillin, per ID recommendations will need picc line before discharge.He should be set up with a continuos infusion of penicillin before discharge. Continue wound care per surgery team.   2. T2dm with uncontrolled hyperglycemia.Patient's fasting glucose has been 228 today, he continue to tolerate po, will continue current regimen of 30 units of glargine, plus 5 units pre-meal and insulin sliding scale of insulin aspart. Target 160 to 180 mg/dl, avoid hypoglycemia. Patient is tolerating po well.   3. HTN.Continue blood pressure control with  amlodipine.   4. AKI on CKD stage 3, with hypokalemia and hyponatremia. Renal function with serum cr at 1,54 with K at 4,5 and serum bicarbonate at 22. Will continue to avoid nephrotoxic medications or hypotension, follow daily renal function and electrolytes.   5. Dyslipidemia. Continue with  atorvastatin.  6. Obesity. BMI 30. Lifestyle modifications as outpatient.   7. Right toe infection.Continue with local wound care.   Patient is medically stable, continue to follow on capillary glucose, and renal function. Will need picc line before discharge for antibiotic therapy per ID recommendations. Will signs off please call if further questions.   DVT prophylaxis:enoxaparin Code Status:full Family Communication:no family at the bedside Disposition Plan/ discharge barriers:Pending clinica improvement.   Body mass index is 31.31 kg/m. Malnutrition Type:  Nutrition Problem: Increased nutrient needs Etiology: acute illness, chronic illness, wound healing   Malnutrition Characteristics:  Signs/Symptoms: estimated needs   Nutrition  Interventions:  Interventions: Prostat, MVI  RN Pressure Injury Documentation:  Antimicrobials:   Penicillin.     Subjective: Patient is feeling well, continue to improve pain on left upper chest, no nausea or vomiting and tolerating po well.   Objective: Vitals:   08/31/18 0800 08/31/18 0900 08/31/18 1000 08/31/18 1100  BP: (!) 147/80 134/83 (!) 144/88 (!) 155/94  Pulse:      Resp: 13 (!) 8 16 15   Temp:      TempSrc:      SpO2: 98% 100% 97% 97%  Weight:      Height:        Intake/Output Summary (Last 24 hours) at 08/31/2018 1255 Last data filed at 08/31/2018 0800 Gross per 24 hour  Intake 1955.75 ml  Output 1565 ml  Net 390.75 ml   Filed Weights   08/29/18 0213 08/30/18 0500 08/31/18 0337  Weight: 102.2 kg 100 kg 99 kg    Examination:   General: Not in pain or dyspnea, deconditioned  Neurology: Awake and alert, non focal  E ENT: no pallor, no icterus, oral mucosa moist Cardiovascular: No JVD. S1-S2 present, rhythmic, no gallops, rubs, or murmurs. Trace lower extremity edema. Pulmonary: positive breath sounds bilaterally, adequate air movement, no wheezing, rhonchi or rales. Gastrointestinal. Abdomen flat, no organomegaly, non tender, no rebound or guarding Skin. Wound vac in place to left upper chest.  Musculoskeletal: no joint deformities     Data Reviewed: I have personally reviewed following labs and imaging studies  CBC: Recent Labs  Lab 08/27/18 0253 08/28/18 0257 08/29/18 0242 08/30/18 0228 08/31/18 0349  WBC 14.3* 13.1* 10.5 9.1 8.9  NEUTROABS 12.2* 11.9* 8.3* 7.1 7.0  HGB 8.8* 8.8* 7.2* 7.4* 7.3*  HCT 28.3* 27.7* 23.3* 23.4* 23.8*  MCV 80.2 81.2 80.9 81.8 80.7  PLT 602* 624* 487* 474* 283*   Basic Metabolic Panel: Recent Labs  Lab 08/27/18 0253 08/28/18 0257 08/29/18 0242 08/29/18 2147 08/30/18 0228 08/31/18 0349  NA 136 134* 134*  --  131* 129*  K 4.6 4.6 4.2  --  4.2 4.5  CL 108 105 104  --  102 101  CO2 22 20* 22  --   21* 22  GLUCOSE 156* 236* 125* 79 208* 228*  BUN 24* 20 19  --  18 14  CREATININE 1.73* 1.53* 1.48*  --  1.66* 1.54*  CALCIUM 7.8* 7.8* 7.6*  --  7.5* 7.6*   GFR: Estimated Creatinine Clearance: 66.2 mL/min (A) (by C-G formula based on SCr of 1.54 mg/dL (H)). Liver Function Tests: No results for input(s): AST, ALT, ALKPHOS, BILITOT, PROT, ALBUMIN in the last 168 hours. No results for input(s): LIPASE, AMYLASE in the last 168 hours. No results for input(s): AMMONIA in the last 168 hours. Coagulation Profile: No results for input(s): INR, PROTIME in the last 168 hours. Cardiac Enzymes: No results for input(s): CKTOTAL, CKMB, CKMBINDEX, TROPONINI in the last 168 hours. BNP (last 3 results) No results for input(s): PROBNP in the last 8760 hours. HbA1C: No results for input(s): HGBA1C in the last 72 hours. CBG: Recent Labs  Lab 08/30/18 1814 08/30/18 2124 08/31/18 0633 08/31/18 0749 08/31/18 1205  GLUCAP 131* 257* 221* 209* 192*   Lipid Profile: No results for input(s): CHOL, HDL, LDLCALC, TRIG, CHOLHDL, LDLDIRECT in the last 72 hours. Thyroid Function Tests: No results for input(s): TSH, T4TOTAL, FREET4, T3FREE, THYROIDAB in the last 72 hours. Anemia Panel: No results for input(s): VITAMINB12, FOLATE, FERRITIN, TIBC, IRON, RETICCTPCT in the last 72 hours.    Radiology Studies: I have reviewed all of  the imaging during this hospital visit personally     Scheduled Meds: . sodium chloride   Intravenous Once  . amLODipine  10 mg Oral Daily  . atorvastatin  10 mg Oral Daily  . docusate sodium  100 mg Oral BID  . feeding supplement (PRO-STAT SUGAR FREE 64)  30 mL Oral BID  . furosemide  40 mg Intravenous Once  . insulin aspart  0-15 Units Subcutaneous TID WC  . insulin aspart  0-5 Units Subcutaneous QHS  . insulin aspart  5 Units Subcutaneous TID WC  . insulin detemir  30 Units Subcutaneous Daily  . nystatin   Topical BID  . polyethylene glycol  17 g Oral Daily  .  potassium chloride  20 mEq Oral Once   Continuous Infusions: . sodium chloride Stopped (08/30/18 1334)  . pencillin G potassium IV 4 Million Units (08/31/18 1000)     LOS: 14 days        Mauricio Gerome Apley, MD [facility] Pager [phone]

## 2018-08-31 NOTE — Progress Notes (Signed)
TCTS BRIEF SICU PROGRESS NOTE  1 Day Post-Op  S/P Procedure(s) (LRB): WOUND VAC CHANGE, LEFT CHEST AND NECK, POSSIBLE DEBRIDEMENT (Left) APPLICATION OF WOUND VAC (Left)   Stable day  Plan: Continue current plan  Rexene Alberts, MD 08/31/2018 4:31 PM

## 2018-09-01 LAB — GLUCOSE, CAPILLARY
GLUCOSE-CAPILLARY: 127 mg/dL — AB (ref 70–99)
GLUCOSE-CAPILLARY: 104 mg/dL — AB (ref 70–99)
GLUCOSE-CAPILLARY: 66 mg/dL — AB (ref 70–99)
Glucose-Capillary: 86 mg/dL (ref 70–99)
Glucose-Capillary: 140 mg/dL — ABNORMAL HIGH (ref 70–99)
Glucose-Capillary: 60 mg/dL — ABNORMAL LOW (ref 70–99)

## 2018-09-01 LAB — AEROBIC/ANAEROBIC CULTURE W GRAM STAIN (SURGICAL/DEEP WOUND): Culture: NO GROWTH

## 2018-09-01 LAB — BASIC METABOLIC PANEL
Anion gap: 7 (ref 5–15)
BUN: 17 mg/dL (ref 6–20)
CO2: 23 mmol/L (ref 22–32)
Calcium: 7.9 mg/dL — ABNORMAL LOW (ref 8.9–10.3)
Chloride: 103 mmol/L (ref 98–111)
Creatinine, Ser: 1.54 mg/dL — ABNORMAL HIGH (ref 0.61–1.24)
GFR calc Af Amer: 59 mL/min — ABNORMAL LOW (ref >60)
GFR calc non Af Amer: 51 mL/min — ABNORMAL LOW (ref >60)
Glucose, Bld: 90 mg/dL (ref 70–99)
POTASSIUM: 4.2 mmol/L (ref 3.5–5.1)
Sodium: 133 mmol/L — ABNORMAL LOW (ref 135–145)

## 2018-09-01 LAB — AEROBIC/ANAEROBIC CULTURE (SURGICAL/DEEP WOUND)

## 2018-09-01 MED ORDER — FUROSEMIDE 10 MG/ML IJ SOLN
40.0000 mg | Freq: Once | INTRAMUSCULAR | Status: AC
  Administered 2018-09-01: 40 mg via INTRAVENOUS
  Filled 2018-09-01: qty 4

## 2018-09-01 MED ORDER — POTASSIUM CHLORIDE CRYS ER 20 MEQ PO TBCR
20.0000 meq | EXTENDED_RELEASE_TABLET | Freq: Once | ORAL | Status: AC
  Administered 2018-09-01: 20 meq via ORAL
  Filled 2018-09-01: qty 1

## 2018-09-01 MED ORDER — METOPROLOL TARTRATE 25 MG PO TABS
25.0000 mg | ORAL_TABLET | Freq: Two times a day (BID) | ORAL | Status: DC
  Administered 2018-09-01 – 2018-09-05 (×9): 25 mg via ORAL
  Filled 2018-09-01 (×9): qty 1

## 2018-09-01 MED ORDER — FE FUMARATE-B12-VIT C-FA-IFC PO CAPS
1.0000 | ORAL_CAPSULE | Freq: Two times a day (BID) | ORAL | Status: DC
  Administered 2018-09-01 – 2018-09-05 (×9): 1 via ORAL
  Filled 2018-09-01 (×9): qty 1

## 2018-09-01 NOTE — Progress Notes (Signed)
2 Days Post-Op Procedure(s) (LRB): WOUND VAC CHANGE, LEFT CHEST AND NECK, POSSIBLE DEBRIDEMENT (Left) APPLICATION OF WOUND VAC (Left) Subjective: No complaints. Minimal pain.  Objective: Vital signs in last 24 hours: Temp:  [98.5 F (36.9 C)-100.5 F (38.1 C)] 98.5 F (36.9 C) (01/22 0744) Cardiac Rhythm: Normal sinus rhythm (01/22 0000) Resp:  [7-22] 14 (01/22 0700) BP: (129-172)/(70-98) 139/79 (01/22 0700) SpO2:  [94 %-100 %] 98 % (01/22 0700) Weight:  [97.4 kg] 97.4 kg (01/22 0500)  Hemodynamic parameters for last 24 hours:    Intake/Output from previous day: 01/21 0701 - 01/22 0700 In: 1527.3 [P.O.:480; I.V.:47.3; IV Piggyback:1000] Out: 3900 [Urine:3850; Drains:50] Intake/Output this shift: No intake/output data recorded.  General appearance: alert and cooperative Heart: regular rate and rhythm, S1, S2 normal, no murmur, click, rub or gallop Lungs: clear to auscultation bilaterally Extremities: edema moderate in legs. Wound: VAC dressing intact.  Lab Results: Recent Labs    08/30/18 0228 08/31/18 0349  WBC 9.1 8.9  HGB 7.4* 7.3*  HCT 23.4* 23.8*  PLT 474* 424*   BMET:  Recent Labs    08/31/18 0349 09/01/18 0249  NA 129* 133*  K 4.5 4.2  CL 101 103  CO2 22 23  GLUCOSE 228* 90  BUN 14 17  CREATININE 1.54* 1.54*  CALCIUM 7.6* 7.9*    PT/INR: No results for input(s): LABPROT, INR in the last 72 hours. ABG No results found for: PHART, HCO3, TCO2, ACIDBASEDEF, O2SAT CBG (last 3)  Recent Labs    08/31/18 1554 08/31/18 2219 09/01/18 0620  GLUCAP 151* 80 86    Assessment/Plan: S/P Procedure(s) (LRB): WOUND VAC CHANGE, LEFT CHEST AND NECK, POSSIBLE DEBRIDEMENT (Left) APPLICATION OF WOUND VAC (Left)  Hypertensive on Norvasc 10 and received a couple prn doses of Labetalol. Will add Lopressor 25 bid and follow. He was on ACE previously but can't restart that with elevated creat.  Glucose under adequate control  Diuresed well yesterday, -2300 cc,  and wt down about 4 lbs. Still has a lot of edema so will give him another dose of lasix today. Creat stable.  LUE duplex shows no DVT. Swelling may be due to local trauma from wound debridement.   Anemia: repeat CBC tomorrow. He is on Trinsicon.   Eating well.  Awaiting PICC line and WOC evaluation for VAC change. Then I think he can [facility] to [facility] or 4E.     LOS: 15 days    Gaye Pollack 09/01/2018

## 2018-09-01 NOTE — Discharge Summary (Signed)
Physician Discharge Summary  Patient ID: Randy Oneal MRN: [id number] DOB/AGE: 1966-02-17 53 y.o.  Admit date: 08/16/2018 Discharge date: 09/05/2018  Admission Diagnoses:  Patient Active Problem List   Diagnosis Date Noted  . Open wound of right great toe   . Septic arthritis of left sternoclavicular joint ([place])   . Mediastinal abscess ([place]) 08/27/2018  . Cellulitis of great toe, right   . Diabetic hyperosmolar non-ketotic state ([place]) 08/17/2018  . Hyperglycemia 08/17/2018  . AKI (acute kidney injury) ([place]) 08/16/2018  . Hyponatremia 08/16/2018  . Hyperlipidemia associated with type 2 diabetes mellitus ([place]) 03/04/2017  . Erectile dysfunction 03/04/2017  . Personal history of noncompliance with medical treatment, presenting hazards to health 11/22/2015  . HTN (hypertension), benign 01/30/2014  . Loss of weight 01/30/2014  . Type 2 diabetes mellitus not at goal [facility]) 03/30/2013   Discharge Diagnoses:   Patient Active Problem List   Diagnosis Date Noted  . Open wound of right great toe   . Septic arthritis of left sternoclavicular joint ([place])   . Mediastinal abscess ([place]) 08/27/2018  . Cellulitis of great toe, right   . Diabetic hyperosmolar non-ketotic state ([place]) 08/17/2018  . Hyperglycemia 08/17/2018  . AKI (acute kidney injury) ([place]) 08/16/2018  . Hyponatremia 08/16/2018  . Hyperlipidemia associated with type 2 diabetes mellitus ([place]) 03/04/2017  . Erectile dysfunction 03/04/2017  . Personal history of noncompliance with medical treatment, presenting hazards to health 11/22/2015  . HTN (hypertension), benign 01/30/2014  . Loss of weight 01/30/2014  . Type 2 diabetes mellitus not at goal [facility]) 03/30/2013   Discharged Condition: good  History of Present Illness:  Randy Oneal is a 53 yo AA male with known history of HTN and diabetes.  [facility] with DKA, confusion, and AKI, hyponatremia, and signs of sepsis.  The patient had been trimming  his own caluses of his right great toe.  He was noticed to have cellulitis and drainage from the right great toe.  He was also noted to have a plantar pustule on his left foot.  He was evaluated by Dr. Arnoldo Morale from general surgery and he did not feel the patient had any evidence of drainable abcess.  Blood cultures were obtained and showed group B strep.  He was started on ABX for this.  He also noticed evidence of swelling and tenderness of the left lower neck and sternoclavicular region.  CT scan of the neck and chest showed evidence of abscess in the lower neck with fluid and air present with extension behind the upper portion of the manubrium.  ENT consult was obtained who felt there was no source in the neck or oropharynx.  He was transferred to [facility] for further care.  Hospital Course:   Upon arrival TCTS consult was requested.  The patient was evaluated by Dr. Cyndia Bent who felt I/D would be required with placement of a wound vac.  The risks and benefits of the procedure were explained to the patient and he was agreeable to proceed.  He was taken to the operating room on 08/27/18.  He underwent I/D of left sternoclavicular joint, mediastinum, and neck, with placement of wound vac.  He tolerated the procedure without difficulty and was taken to the SICU in stable condition.  During his stay in the SICU he was restarted on his home regimen of Norvasc for HTN.  His hypertension persisted and he was started on low dose Lopressor.  ID consult was placed and  antibiotic regimen was recommended for bacteremia.  He was hyperglycemic and his insulin regimen was adjusted as tolerated.  He was taken back to the OR on 08/30/18 for repeat debridement and wound vac change.  He developed swelling of his LUL, duplex was obtained and was negative for DVT.  He also developed lower extremity edema.  He was diuresed with IV lasix.  He was started on iron for Hgb of 7.3 which remained stable.  He underwent PICC line  placement for prolonged antibiotics.  These will complete on 10/12/2018.  He will require weekly lab draws including CBC w/diff, BMP, CRP, ESR to [phone].  Wound care consult was obtained to take over wound vac changes.  These will be performed every MWF.  His creatinine level returned to baseline.  He remained hypertensive and was restarted on his home lisinopril at a reduced dose.  Home health arrangements have been made.  He is ambulating without difficulty.  He is medically stable for discharge home today.   Consults: ID  Significant Diagnostic Studies: radiology:   CT scan: Extensive regional inflammatory process in the left low neck, supraclavicular region and with extension to the superior mediastinum consistent with phlegmonous inflammation and multilocular abscess formation. Most common cause in this region would be complication from septic arthritis of the left sternoclavicular joint, though that is not definite. See above for full discussion. Incision and debridement may be necessary.  Treatments: surgery:   1.  Incision and drainage and debridement of left sternoclavicular joint, left neck, and mediastinum  Discharge Exam: Blood pressure 128/71, pulse 88, temperature 98.7 F (37.1 C), temperature source Oral, resp. rate 18, height '5\' 10"'$  (1.778 m), weight 90.2 kg, SpO2 99 %.  General appearance: alert, cooperative and no distress Heart: regular rate and rhythm Lungs: clear to auscultation bilaterally Abdomen: soft, non-tender; bowel sounds normal; no masses,  no organomegaly Extremities: extremities normal, atraumatic, no cyanosis or edema Wound: wound vac in place  Discharge disposition: 01-Home or Self Care  Discharge Medications:  Discharge Instructions    Home infusion instructions [facility] May follow [place] Dosing Protocol; May administer Cathflo as needed to maintain patency of vascular access device.; [place] of vascular access device: per [facility]  Protocol: 0.9% NaCl pre/post medica...   Complete by:  As directed    Instructions:  May follow [place] Dosing Protocol   Instructions:  May administer Cathflo as needed to maintain patency of vascular access device.   Instructions:  Flushing of vascular access device: per [facility] Protocol: 0.9% NaCl pre/post medication administration and prn patency; Heparin 100 u/ml, 44m for implanted ports and Heparin 10u/ml, 52mfor all other central venous catheters.   Instructions:  May follow [facility] Anaphylaxis Protocol for First Dose Administration in the home: 0.9% NaCl at 25-50 ml/hr to maintain IV access for protocol meds. Epinephrine 0.3 ml IV/IM PRN and Benadryl 25-50 IV/IM PRN s/s of anaphylaxis.   Instructions:  [place] Coordinator (RN) to assist per patient IV care needs in the home PRN.     Allergies as of 09/05/2018   No Known Allergies     Medication List    STOP taking these medications   ciprofloxacin 500 MG tablet Commonly known as:  CIPRO   insulin aspart 100 UNIT/ML FlexPen Commonly known as:  NOVOLOG FLEXPEN Replaced by:  insulin lispro 100 UNIT/ML cartridge   insulin degludec 100 UNIT/ML Sopn FlexTouch Pen Commonly known as:  TRESIBA Replaced by:  insulin glargine 100 UNIT/ML  injection   lisinopril 20 MG tablet Commonly known as:  PRINIVIL,ZESTRIL     TAKE these medications   acetaminophen 325 MG tablet Commonly known as:  TYLENOL Take 2 tablets (650 mg total) by mouth every 6 (six) hours as needed for mild pain (or Fever >/= 101). What changed:    medication strength  how much to take  reasons to take this   ADVIL PM 200-38 MG Tabs Generic drug:  Ibuprofen-diphenhydrAMINE Cit Take 2 tablets by mouth at bedtime as needed (for pain/sleep).   amLODipine 10 MG tablet Commonly known as:  NORVASC Take 1 tablet (10 mg total) by mouth daily.   atorvastatin 10 MG tablet Commonly known as:  LIPITOR Take 1 tablet (10 mg total) by mouth daily.    FREESTYLE LIBRE SENSOR SYSTEM Misc Use one sensor every 10 days.   furosemide 40 MG tablet Commonly known as:  LASIX Take 1 tablet (40 mg total) by mouth daily as needed. Take for weight gain of 3-5 lbs   glucose blood test strip Commonly known as:  BAYER CONTOUR NEXT TEST USE TO TEST FOUR TIMES DAILY.   ibuprofen 200 MG tablet Commonly known as:  ADVIL,MOTRIN Take 400 mg by mouth every 6 (six) hours as needed for mild pain or moderate pain.   insulin glargine 100 UNIT/ML injection Commonly known as:  LANTUS Inject 0.3 mLs (30 Units total) into the skin at bedtime. Replaces:  insulin degludec 100 UNIT/ML Sopn FlexTouch Pen   insulin lispro 100 UNIT/ML cartridge Commonly known as:  HUMALOG Inject 0.08 mLs (8 Units total) into the skin 3 (three) times daily with meals. Replaces:  insulin aspart 100 UNIT/ML FlexPen   metoprolol tartrate 50 MG tablet Commonly known as:  LOPRESSOR Take 1 tablet (50 mg total) by mouth 2 (two) times daily.   MICROLET LANCETS Misc USE FOUR TIMES A DAY AS DIRECTED.   NOVOFINE 32G X 6 MM Misc Generic drug:  Insulin Pen Needle USE AS DIRECTED WITH INSULIN PEN FOUR TIMES DAILY.   Insulin Pen Needle 31G X 8 MM Misc Commonly known as:  B-D ULTRAFINE Oneal SHORT PEN 1 each by Does not apply route as directed.   nystatin powder Commonly known as:  MYCOSTATIN/NYSTOP Apply topically 2 (two) times daily. To groin and scrotum   Oxycodone HCl 10 MG Tabs Take 1 tablet (10 mg total) by mouth every 4 (four) hours as needed for moderate pain or severe pain.   penicillin G  IVPB Inject 24 Million Units into the vein daily. Via continuous infusion. Indication: Bacteremia Last Day of Therapy:  10/12/2018 Labs - Once weekly:  CBC/D and BMP, Labs - Every other week:  ESR and CRP   penicillin G potassium 4 Million Units in dextrose 5 % 250 mL Inject 4 Million Units into the vein every 4 (four) hours.   potassium chloride SA 20 MEQ tablet Commonly known as:   K-DUR,KLOR-CON Take 1 tablet (20 mEq total) by mouth daily as needed. Only take on days you use Lasix   sildenafil 20 MG tablet Commonly known as:  REVATIO May take up to 5 before relations as directed   sulfamethoxazole-trimethoprim 800-160 MG tablet Commonly known as:  BACTRIM DS,SEPTRA DS Take 1 tablet by mouth 2 (two) times daily.            Home Infusion Instuctions  (From admission, onward)         Start     Ordered   09/04/18 0000  Home  infusion instructions [facility] May follow [place] Dosing Protocol; May administer Cathflo as needed to maintain patency of vascular access device.; Flushing of vascular access device: per [facility] Protocol: 0.9% NaCl pre/post medica...    Question Answer Comment  Instructions May follow [place] Dosing Protocol   Instructions May administer Cathflo as needed to maintain patency of vascular access device.   Instructions Flushing of vascular access device: per [facility] Protocol: 0.9% NaCl pre/post medication administration and prn patency; Heparin 100 u/ml, 35m for implanted ports and Heparin 10u/ml, 593mfor all other central venous catheters.   Instructions May follow [facility] Anaphylaxis Protocol for First Dose Administration in the home: 0.9% NaCl at 25-50 ml/hr to maintain IV access for protocol meds. Epinephrine 0.3 ml IV/IM PRN and Benadryl 25-50 IV/IM PRN s/s of anaphylaxis.   Instructions [facility] Infusion Coordinator (RN) to assist per patient IV care needs in the home PRN.      09/04/18 0818         Follow-up Information    [place], ScElayne SnareMD In 3 days.   Specialty:  Family Medicine Why:  To be seen for a checkup Contact information: [phone]        [facility] - Dme Follow up.   Why:  Home infusion antibiotics Contact information: [address] [place] [phone]      Health, [facility]-Home Follow up.    Specialty:  Home Health Services Why:  Ho[place]ersurse for wound VAC drsg changes ([facility] wound VAC arranged) Contact information: [phone]        [place] up.   Why:  [facility] negative pressure wound therapy       BaGaye PollackMD Follow up on 09/15/2018.   Specialty:  Cardiothoracic Surgery Why:  Appointment is at 4:30, please bring wound vac sponge kit with you. Contact information: [address] [place] [phone]        VaTommy MedalCoLavell IslamMD Follow up on 10/05/2018.   Specialty:  Infectious Diseases Why:  Appointment is at 9:00 Contact information: [phone]           Signed: ErEllwood Handler/26/2020, 7:47 AM

## 2018-09-01 NOTE — Care Management Note (Signed)
Case Management Note  Patient Details  Name: Randy Oneal MRN: [id number] Date of Birth: 1966-02-19  Subjective/Objective:  53 yo male transferred from [facility] to [facility] for ENT and CT surgery evaluation; s/p left upper chest abscess I&D 08/27/18.           Action/Plan: CM met with patient to discuss dispositional needs. Patient will need LT IV ABT and wound vac therapy to left chest/neck. Patient reports living at home with his spouse, and was independent with no AD PTA. PCP verified as: Dr. Sallee Lange; pharmacy of choice: [facility]. CMS HH compare list provided to patient with [facility] selected for HHRN (vac changes/PICC care/IV ABT teach) and home infusion; [facility] for wound vac therapy. CM placed the [facility] wound vac order form on patients shadow chart for MD/PA signature; will need recent wound measurements for wound vac approval. [place] referral given to Adonis Brook, [facility] liaison; Adonis Brook, [facility] home infusion following for IV ABT. Wound vac referral discussed with Leontine Locket RN, [facility] liaison; AVS updated. CM team will continue to follow.   Expected Discharge Date:                  Expected Discharge Plan:  [place]  In-House Referral:  Clinical Social Work  Discharge planning Services  CM Consult  Post Acute Care Choice:  Durable Medical Equipment, Home Health Choice offered to:  Patient  DME Arranged:  Negative pressure wound device, IV pump/equipment DME Agency:  [place]:  RN, Disease Management, IV Antibiotics HH Agency:  [place]  Status of Service:  In process, will continue to follow  If discussed at Long Length of Stay Meetings, dates discussed:    Additional Comments:  Midge Minium RN, BSN, NCM-BC, ACM-RN [phone] 09/01/2018, 11:37 AM

## 2018-09-01 NOTE — Progress Notes (Signed)
CT surgery p.m. Rounds  Patient comfortable sitting up in chair eating a regular diet Wound VAC sponge changed today at bedside without difficulty VAC sponge compressed and draining minimal serosanguineous fluid

## 2018-09-01 NOTE — Consult Note (Addendum)
[facility] Nurse wound consult note Patient receiving care in [place].  No family present.  Patient received medication prior to [facility] dressing change.  Patient tolerated the procedure beautifully. Reason for Consult: Left chest VAC change Wound type: Surgical site for abscess Measurement: The wound measures 3 cm x 8.5 cm x 4 cm that extends down into the pocket on the patient's left lateral side of the wound; and, 4.7 cm depth on the patient's right medial pocket of the wound. Wound bed: 99% pink, 1% yellow Drainage (amount, consistency, odor) serosanginous in cannister, no odor, no erythema surrounding wound Periwound: Intact, normal color and texture Dressing procedure/placement/frequency: MWF VAC change by [place] nurse (uses a small dressing) Immediate seal was obtained via VAC pump at previously entered settings.  Two pieces of black foam removed, two pieces of black foam placed into wound bed. Val Riles, RN, MSN, CWOCN, CNS-BC, pager [phone]

## 2018-09-01 NOTE — Progress Notes (Signed)
Hypoglycemic Event  CBG: 60  Treatment: 8 oz juice/soda  Symptoms: Hungry  Follow-up CBG: Time: 2133 CBG Result: 66  Possible Reasons for Event: Inadequate meal intake  Comments/MD notified:MD not notified. Protocol initiated. Protocol used twice due to repeat CBG being 66.     Angelica Pou

## 2018-09-02 LAB — BASIC METABOLIC PANEL
Anion gap: 7 (ref 5–15)
BUN: 18 mg/dL (ref 6–20)
CO2: 24 mmol/L (ref 22–32)
Calcium: 7.6 mg/dL — ABNORMAL LOW (ref 8.9–10.3)
Chloride: 99 mmol/L (ref 98–111)
Creatinine, Ser: 1.52 mg/dL — ABNORMAL HIGH (ref 0.61–1.24)
GFR calc Af Amer: >60 mL/min (ref >60)
GFR, EST NON AFRICAN AMERICAN: 52 mL/min — AB (ref >60)
Glucose, Bld: 163 mg/dL — ABNORMAL HIGH (ref 70–99)
Potassium: 4.1 mmol/L (ref 3.5–5.1)
Sodium: 130 mmol/L — ABNORMAL LOW (ref 135–145)

## 2018-09-02 LAB — CBC
HCT: 21.3 % — ABNORMAL LOW (ref 39.0–52.0)
Hemoglobin: 7 g/dL — ABNORMAL LOW (ref 13.0–17.0)
MCH: 26.3 pg (ref 26.0–34.0)
MCHC: 32.9 g/dL (ref 30.0–36.0)
MCV: 80.1 fL (ref 80.0–100.0)
Platelets: 364 10*3/uL (ref 150–400)
RBC: 2.66 MIL/uL — ABNORMAL LOW (ref 4.22–5.81)
RDW: 13.7 % (ref 11.5–15.5)
WBC: 8.1 10*3/uL (ref 4.0–10.5)
nRBC: 0 % (ref 0.0–0.2)

## 2018-09-02 LAB — GLUCOSE, CAPILLARY
Glucose-Capillary: 144 mg/dL — ABNORMAL HIGH (ref 70–99)
Glucose-Capillary: 154 mg/dL — ABNORMAL HIGH (ref 70–99)
Glucose-Capillary: 192 mg/dL — ABNORMAL HIGH (ref 70–99)
Glucose-Capillary: 79 mg/dL (ref 70–99)
Glucose-Capillary: 98 mg/dL (ref 70–99)

## 2018-09-02 MED ORDER — LISINOPRIL 5 MG PO TABS
5.0000 mg | ORAL_TABLET | Freq: Every day | ORAL | Status: DC
  Administered 2018-09-02 – 2018-09-03 (×2): 5 mg via ORAL
  Filled 2018-09-02 (×2): qty 1

## 2018-09-02 MED ORDER — SODIUM CHLORIDE 0.9% FLUSH
10.0000 mL | Freq: Two times a day (BID) | INTRAVENOUS | Status: DC
  Administered 2018-09-02 – 2018-09-05 (×3): 10 mL

## 2018-09-02 MED ORDER — INSULIN DETEMIR 100 UNIT/ML ~~LOC~~ SOLN
25.0000 [IU] | Freq: Every day | SUBCUTANEOUS | Status: DC
  Administered 2018-09-02 – 2018-09-05 (×4): 25 [IU] via SUBCUTANEOUS
  Filled 2018-09-02 (×4): qty 0.25

## 2018-09-02 MED ORDER — SODIUM CHLORIDE 0.9% FLUSH: 10.0000 mL | INTRAVENOUS | Status: DC | PRN

## 2018-09-02 MED ORDER — FUROSEMIDE 10 MG/ML IJ SOLN
40.0000 mg | Freq: Once | INTRAMUSCULAR | Status: AC
  Administered 2018-09-02: 40 mg via INTRAVENOUS
  Filled 2018-09-02: qty 4

## 2018-09-02 NOTE — Progress Notes (Signed)
TCTS DAILY ICU PROGRESS NOTE                   [address]            [place] [address]          [phone]   3 Days Post-Op Procedure(s) (LRB): WOUND VAC CHANGE, LEFT CHEST AND NECK, POSSIBLE DEBRIDEMENT (Left) APPLICATION OF WOUND VAC (Left)  Total Length of Stay:  LOS: 16 days   Subjective:  Patient concerned about blood sugars dropping in the evening.  I explained that his diabetes is poorly controlled prior to admission.  As he is on a controlled diet currently his blood sugars will fluctuate, however I would adjust his insulin regimen to try and avoid going hypoglycemic.  I also stressed importance that he keeps his sugars below 200 at discharge.  Objective: Vital signs in last 24 hours: Temp:  [98.5 F (36.9 C)-99.7 F (37.6 C)] 98.9 F (37.2 C) (01/23 0727) Cardiac Rhythm: Normal sinus rhythm (01/23 0400) Resp:  [4-26] 26 (01/23 0709) BP: (121-169)/(70-94) 157/88 (01/23 0709) SpO2:  [88 %-100 %] 99 % (01/23 0709) Weight:  [95.1 kg] 95.1 kg (01/23 0558)  Filed Weights   08/31/18 0337 09/01/18 0500 09/02/18 0558  Weight: 99 kg 97.4 kg 95.1 kg    Weight change: -2.287 kg   Intake/Output from previous day: 01/22 0701 - 01/23 0700 In: 1876.2 [P.O.:480; I.V.:146.2; IV Piggyback:1250] Out: 9892 [JJHER:7408; Drains:100]  Current Meds: Scheduled Meds: . sodium chloride   Intravenous Once  . amLODipine  10 mg Oral Daily  . atorvastatin  10 mg Oral Daily  . docusate sodium  100 mg Oral BID  . feeding supplement (PRO-STAT SUGAR FREE 64)  30 mL Oral BID  . ferrous [id number] C-folic acid  1 capsule Oral BID PC  . insulin aspart  0-15 Units Subcutaneous TID WC  . insulin aspart  0-5 Units Subcutaneous QHS  . insulin aspart  5 Units Subcutaneous TID WC  . insulin detemir  30 Units Subcutaneous Daily  . metoprolol tartrate  25 mg Oral BID  . nystatin   Topical BID  . polyethylene glycol  17 g Oral Daily   Continuous Infusions: . sodium  chloride 10 mL/hr at 09/02/18 0700  . pencillin G potassium IV 4 Million Units (09/02/18 0543)   PRN Meds:.acetaminophen **OR** [DISCONTINUED] acetaminophen, fentaNYL (SUBLIMAZE) injection, labetalol, ondansetron **OR** ondansetron (ZOFRAN) IV, oxyCODONE  General appearance: alert, cooperative and no distress Heart: regular rate and rhythm Lungs: clear to auscultation bilaterally Abdomen: soft, non-tender; bowel sounds normal; no masses,  no organomegaly Extremities: edema 1-2+ pitting  Wound: wound vac in place, no surrounding erythema  Lab Results: CBC: Recent Labs    08/31/18 0349 09/02/18 0307  WBC 8.9 8.1  HGB 7.3* 7.0*  HCT 23.8* 21.3*  PLT 424* 364   BMET:  Recent Labs    09/01/18 0249 09/02/18 0307  NA 133* 130*  K 4.2 4.1  CL 103 99  CO2 23 24  GLUCOSE 90 163*  BUN 17 18  CREATININE 1.54* 1.52*  CALCIUM 7.9* 7.6*    CMET: Lab Results  Component Value Date   WBC 8.1 09/02/2018   HGB 7.0 (L) 09/02/2018   HCT 21.3 (L) 09/02/2018   PLT 364 09/02/2018   GLUCOSE 163 (H) 09/02/2018   CHOL 183 02/04/2018   TRIG 62 02/04/2018   HDL 65 02/04/2018   LDLCALC 106 (H) 02/04/2018   ALT 41 08/18/2018  AST 71 (H) 08/18/2018   NA 130 (L) 09/02/2018   K 4.1 09/02/2018   CL 99 09/02/2018   CREATININE 1.52 (H) 09/02/2018   BUN 18 09/02/2018   CO2 24 09/02/2018   TSH 1.670 02/04/2018   INR 1.01 08/17/2018   HGBA1C 13.0 (A) 08/16/2018   MICROALBUR 24.5 11/05/2015      PT/INR: No results for input(s): LABPROT, INR in the last 72 hours. Radiology: No results found.   Assessment/Plan: S/P Procedure(s) (LRB): WOUND VAC CHANGE, LEFT CHEST AND NECK, POSSIBLE DEBRIDEMENT (Left) APPLICATION OF WOUND VAC (Left)  1. CV- Sinus Tach, mild, remain hypertensive at times- on Lopressor 25 mg BID, Norvasc 10 mg daily, will restart Lisinopril at 5 mg daily 2. Pulm- no acute issues, continue IS 3. Renal- creatinine down to 1.52 which appears to be baseline, remains  edematous on exam, will repeat Lasix dose, apply TED hose 4. Hyponatremia- stable 5. ID- afebrile, continue ABX, PICC line to be placed today 6. DM- hypoglycemia overnight, will reduce Levemir to 25 U daily, further management per medicine, will also get Diabetes education consult 7. dispo- patient stable, remains hypertensive, creatinine is at baseline, will restart low dose Lisinopril, continue lasix for additional dose, apply TED hose, PICC line being placed this morning, awaiting bed on 2C or 4E     Tonga Prout 09/02/2018 8:19 AM

## 2018-09-02 NOTE — Progress Notes (Signed)
Peripherally Inserted Central Catheter/Midline Placement  The IV Nurse has discussed with the patient and/or persons authorized to consent for the patient, the purpose of this procedure and the potential benefits and risks involved with this procedure.  The benefits include less needle sticks, lab draws from the catheter, and the patient may be discharged home with the catheter. Risks include, but not limited to, infection, bleeding, blood clot (thrombus formation), and puncture of an artery; nerve damage and irregular heartbeat and possibility to perform a PICC exchange if needed/ordered by physician.  Alternatives to this procedure were also discussed.  Bard Power PICC patient education guide, fact sheet on infection prevention and patient information card has been provided to patient /or left at bedside.    PICC/Midline Placement Documentation        Randy Oneal 09/02/2018, 9:14 AM

## 2018-09-02 NOTE — Progress Notes (Signed)
Pt transferred to 4E-11 via wheelchair from [place] with RN. Pt moved to bed. Pt oriented to bed, call bell, and room. Call bell within reach. CHG bath given. VSS. Tele applied, CCMD notified. Wound vac intact. Will continue to monitor.

## 2018-09-02 NOTE — Progress Notes (Signed)
Inpatient Diabetes Program Recommendations  AACE/ADA: New Consensus Statement on Inpatient Glycemic Control (2015)  Target Ranges:  Prepandial:   less than 140 mg/dL      Peak postprandial:   less than 180 mg/dL (1-2 hours)      Critically ill patients:  140 - 180 mg/dL   Lab Results  Component Value Date   GLUCAP 192 (H) 09/02/2018   HGBA1C 13.0 (A) 08/16/2018    Review of Glycemic Control Results for Randy Oneal, Randy Oneal (MRN [id number]) as of 09/02/2018 09:04  Ref. Range 09/01/2018 06:20 09/01/2018 11:30 09/01/2018 21:14 09/01/2018 21:33 09/01/2018 22:02 09/02/2018 00:25 09/02/2018 07:07  Glucose-Capillary Latest Ref Range: 70 - 99 mg/dL 86 140 (H) 60 (L) 66 (L) 104 (H) 144 (H) 192 (H)   Inpatient Diabetes Program Recommendations:  Noted Hypo of 60 post meal coverage + correction. -Decrease Novolog correction to sensitive tid + hs 0-5  DM Coordinator has spoken to patient about elevated A1c and importance of glycemic control during this hospitalization but plan to followup with patient prior to D/C.  Thank you, Nani Gasser. Jaquelinne Glendening, RN, MSN, CDE  Diabetes Coordinator Inpatient Glycemic Control Team Team Pager [phone] (8am-5pm) 09/02/2018 9:07 AM

## 2018-09-03 LAB — GLUCOSE, CAPILLARY
GLUCOSE-CAPILLARY: 167 mg/dL — AB (ref 70–99)
Glucose-Capillary: 122 mg/dL — ABNORMAL HIGH (ref 70–99)
Glucose-Capillary: 168 mg/dL — ABNORMAL HIGH (ref 70–99)
Glucose-Capillary: 105 mg/dL — ABNORMAL HIGH (ref 70–99)

## 2018-09-03 LAB — BASIC METABOLIC PANEL
Anion gap: 7 (ref 5–15)
BUN: 19 mg/dL (ref 6–20)
CHLORIDE: 98 mmol/L (ref 98–111)
CO2: 25 mmol/L (ref 22–32)
Calcium: 7.7 mg/dL — ABNORMAL LOW (ref 8.9–10.3)
Creatinine, Ser: 1.57 mg/dL — ABNORMAL HIGH (ref 0.61–1.24)
GFR calc Af Amer: 58 mL/min — ABNORMAL LOW (ref >60)
GFR calc non Af Amer: 50 mL/min — ABNORMAL LOW (ref >60)
Glucose, Bld: 110 mg/dL — ABNORMAL HIGH (ref 70–99)
Potassium: 3.8 mmol/L (ref 3.5–5.1)
Sodium: 130 mmol/L — ABNORMAL LOW (ref 135–145)

## 2018-09-03 MED ORDER — FUROSEMIDE 10 MG/ML IJ SOLN
40.0000 mg | Freq: Once | INTRAMUSCULAR | Status: AC
  Administered 2018-09-03: 40 mg via INTRAVENOUS
  Filled 2018-09-03: qty 4

## 2018-09-03 MED ORDER — INSULIN ASPART 100 UNIT/ML ~~LOC~~ SOLN
0.0000 [IU] | Freq: Three times a day (TID) | SUBCUTANEOUS | Status: DC
  Administered 2018-09-03 (×2): 2 [IU] via SUBCUTANEOUS
  Administered 2018-09-04 – 2018-09-05 (×3): 3 [IU] via SUBCUTANEOUS

## 2018-09-03 MED ORDER — POTASSIUM CHLORIDE CRYS ER 20 MEQ PO TBCR
20.0000 meq | EXTENDED_RELEASE_TABLET | Freq: Every day | ORAL | Status: DC
  Administered 2018-09-03 – 2018-09-05 (×3): 20 meq via ORAL
  Filled 2018-09-03 (×3): qty 1

## 2018-09-03 NOTE — Care Management Note (Signed)
Case Management Note Original Note Created Midge Minium RN, BSN, NCM-BC, ACM-RN [phone]   Patient Details  Name: Randy Oneal MRN: [id number] Date of Birth: 21-Jan-1966  Subjective/Objective:  53 yo male transferred from [facility] to [facility] for ENT and CT surgery evaluation; s/p left upper chest abscess I&D 08/27/18.           Action/Plan: CM met with patient to discuss dispositional needs. Patient will need LT IV ABT and wound vac therapy to left chest/neck. Patient reports living at home with his spouse, and was independent with no AD PTA. PCP verified as: Dr. Sallee Lange; pharmacy of choice: [facility]. CMS HH compare list provided to patient with [facility] selected for HHRN (vac changes/PICC care/IV ABT teach) and home infusion; [facility] for wound vac therapy. CM placed the [facility] wound vac order form on patients shadow chart for MD/PA signature; will need recent wound measurements for wound vac approval. [place] referral given to Adonis Brook, [facility] liaison; Adonis Brook, [facility] home infusion following for IV ABT. Wound vac referral discussed with Leontine Locket RN, [facility] liaison; AVS updated. CM team will continue to follow.   Additional CM follow up notes: See below.  Expected Discharge Date:                  Expected Discharge Plan:  [place]  In-House Referral:  Clinical Social Work  Discharge planning Services  CM Consult  Post Acute Care Choice:  Durable Medical Equipment, Home Health Choice offered to:  Patient  DME Arranged:  Negative pressure wound device, IV pump/equipment, Vac DME Agency:  [place]  HH Arranged:  RN, Disease Management, IV Antibiotics HH Agency:  [facility]  Status of Service:  In process, will continue to follow  If discussed at Long Length of Stay Meetings, dates discussed:    Additional Comments:  09/03/18- 1400- Maryanna Stuber RN, CM- [facility] wound VAC form signed this AM by MD- faxed to [facility] for approval and pt has been  approved for home VAC. [facility] home VAC has been delivered to bedside for use to transition home. CM has also spoken with Pam at [facility] for home IV abx needs- she will f/u for transition needs and do teaching at the bedside with pt prior to discharge, She is aware that pt will be ready for discharge over the weekend. Pt has been set up with [facility] for [facility] and IV abx needs per previous CM note. See above.   Marvetta Gibbons RN, BSN Transitions of Care Unit 4E- RN Case Manager [phone] 09/03/2018, 2:26 PM

## 2018-09-03 NOTE — Progress Notes (Signed)
Received notice from Leontine Locket with [facility] that patient's home wound VAC has been approved by insurance. Should be delivered here to room today.

## 2018-09-03 NOTE — Progress Notes (Addendum)
      [address]       [place] [address]             [phone]      4 Days Post-Op Procedure(s) (LRB): WOUND VAC CHANGE, LEFT CHEST AND NECK, POSSIBLE DEBRIDEMENT (Left) APPLICATION OF WOUND VAC (Left)   Subjective:  No new complaints.  Thinks his swelling has improved.  No pain  Objective: Vital signs in last 24 hours: Temp:  [98.1 F (36.7 C)-100.1 F (37.8 C)] 99.6 F (37.6 C) (01/24 0715) Pulse Rate:  [87-112] 92 (01/24 0721) Cardiac Rhythm: Normal sinus rhythm (01/24 0700) Resp:  [10-20] 12 (01/24 0721) BP: (134-157)/(67-87) 152/74 (01/24 0721) SpO2:  [92 %-99 %] 99 % (01/24 0721) Weight:  [92.4 kg] 92.4 kg (01/24 0418)  Intake/Output from previous day: 01/23 0701 - 01/24 0700 In: 2164.2 [P.O.:930; I.V.:234.2; IV Piggyback:1000] Out: 4450 [Urine:4400; Drains:50]  General appearance: alert, cooperative and no distress Heart: regular rate and rhythm Lungs: clear to auscultation bilaterally Abdomen: soft, non-tender; bowel sounds normal; no masses,  no organomegaly Extremities: edema trace Wound: wound vac in place, no erythema  Lab Results: Recent Labs    09/02/18 0307  WBC 8.1  HGB 7.0*  HCT 21.3*  PLT 364   BMET:  Recent Labs    09/02/18 0307 09/03/18 0410  NA 130* 130*  K 4.1 3.8  CL 99 98  CO2 24 25  GLUCOSE 163* 110*  BUN 18 19  CREATININE 1.52* 1.57*  CALCIUM 7.6* 7.7*    PT/INR: No results for input(s): LABPROT, INR in the last 72 hours. ABG No results found for: PHART, HCO3, TCO2, ACIDBASEDEF, O2SAT CBG (last 3)  Recent Labs    09/02/18 1652 09/02/18 2018 09/03/18 0636  GLUCAP 79 98 122*    Assessment/Plan: S/P Procedure(s) (LRB): WOUND VAC CHANGE, LEFT CHEST AND NECK, POSSIBLE DEBRIDEMENT (Left) APPLICATION OF WOUND VAC (Left)  1. CV- Sinus Tach, remains hypertensive at times- continue Lopressor, Norvasc, Lisinopril restarted yesterday 2. Pulm- no acute issues, continue IS 3. Renal- creatinine stable,  weight down 6 more lbs yesterday, will repeat IV lasix today, transition to oral tomorrow 4. Hyponatremic- stable 5. DM- sugar control improved, continue current insulin regimen 6. Dispo- patient stable, will repeat Lasix today, due for wound vac change today, patient doing well, may be able to d/c home soon, once H/H arrangements have been made   LOS: 17 days    Ellwood Handler 09/03/2018   Chart reviewed, patient examined, agree with above. His swelling is much better and weight coming down. Renal function stable.  He will have wound VAC change today and then could go home this weekend if antibiotics and HHRN to change wound VAC can be arranged. I will need to see him in a week or two on Wednesday and will change the wound VAC dressing in the office that day so that I can examine wound.

## 2018-09-03 NOTE — Consult Note (Signed)
[place] Nurse wound follow up Patient receiving care in [place].  No family present.  Two pieces of black foam removed from left chest/neck wound.  Two pieces of black foam placed.  Patient had been premedicated.  He tolerated the dressing change very well.  Patient was cold after the change, three blankets were provided and a towel was draped over his head to improve his comfort. Wound type: Surgical Measurement: Two tunnels exist in the wound.  The depth of the lateral tunnel measures 3.4 cm.  The depth of the medial tunnel measures 4.6 cm.  What I can see of the wound bed is pink. Drainage (amount, consistency, odor) no odor, no induration.  Serosanginous drainage in cannister. Periwound: intact. Dressing procedure/placement/frequency: MWF VAC change by [place] team. Val Riles, RN, MSN, CWOCN, CNS-BC, pager [phone]

## 2018-09-04 LAB — GLUCOSE, CAPILLARY
Glucose-Capillary: 111 mg/dL — ABNORMAL HIGH (ref 70–99)
Glucose-Capillary: 132 mg/dL — ABNORMAL HIGH (ref 70–99)
Glucose-Capillary: 213 mg/dL — ABNORMAL HIGH (ref 70–99)
Glucose-Capillary: 244 mg/dL — ABNORMAL HIGH (ref 70–99)
Glucose-Capillary: 77 mg/dL (ref 70–99)

## 2018-09-04 MED ORDER — FUROSEMIDE 40 MG PO TABS
40.0000 mg | ORAL_TABLET | Freq: Every day | ORAL | Status: DC
  Administered 2018-09-04: 40 mg via ORAL
  Filled 2018-09-04: qty 1

## 2018-09-04 MED ORDER — NYSTATIN 100000 UNIT/GM EX POWD: Freq: Two times a day (BID) | CUTANEOUS | 0 refills | Status: DC

## 2018-09-04 MED ORDER — ACETAMINOPHEN 325 MG PO TABS: 650.0000 mg | ORAL_TABLET | Freq: Four times a day (QID) | ORAL | Status: DC | PRN

## 2018-09-04 MED ORDER — PENICILLIN G POTASSIUM IV (FOR PTA / DISCHARGE USE ONLY): 24.0000 10*6.[IU] | INTRAVENOUS | 0 refills | Status: AC

## 2018-09-04 MED ORDER — PENICILLIN G POTASSIUM 20000000 UNITS IJ SOLR: 4.0000 10*6.[IU] | INTRAVENOUS | 0 refills | Status: AC

## 2018-09-04 MED ORDER — METOPROLOL TARTRATE 25 MG PO TABS: 25.0000 mg | ORAL_TABLET | Freq: Two times a day (BID) | ORAL | 3 refills | Status: DC

## 2018-09-04 MED ORDER — POTASSIUM CHLORIDE CRYS ER 20 MEQ PO TBCR: 20.0000 meq | EXTENDED_RELEASE_TABLET | Freq: Every day | ORAL | 0 refills | Status: DC

## 2018-09-04 MED ORDER — FUROSEMIDE 40 MG PO TABS: 40.0000 mg | ORAL_TABLET | Freq: Every day | ORAL | 0 refills | Status: DC

## 2018-09-04 MED ORDER — OXYCODONE HCL 10 MG PO TABS: 10.0000 mg | ORAL_TABLET | ORAL | 0 refills | Status: DC | PRN

## 2018-09-04 MED ORDER — LISINOPRIL 10 MG PO TABS
10.0000 mg | ORAL_TABLET | Freq: Every day | ORAL | Status: DC
  Administered 2018-09-04: 10 mg via ORAL
  Filled 2018-09-04: qty 1

## 2018-09-04 MED ORDER — LISINOPRIL 10 MG PO TABS: 10.0000 mg | ORAL_TABLET | Freq: Every day | ORAL | 3 refills | Status: DC

## 2018-09-04 NOTE — Progress Notes (Signed)
Pt had couple episode of non sustained vtac at shift change. @1916  15 beats, and 9-12 beats of vtac right after that. Pt went to afib with RVR with HR 150 for a second or two, but it didn't last either. Pt was asymptomatics, stated he didn't feel anything. Vitals stable. Pt now back to NSR with occasional PVCs. ON call [facility] taxed paged and notified. No call back yet. Will continue to monitor.

## 2018-09-04 NOTE — Care Management (Signed)
Spoke with patient regarding d/c possibility today.  Pt was educated by Jeannene Patella, [facility] IV infusion coordinator, yesterday afternoon.  Pt's wife was not present.  Pt was under the impression that home health nurse would come to administer first infusion and instruct them in the home.  Pt seems confused regarding wound vac vs. Infusion instructions and requests that RN be available for first infusion.    Pt will be on continuous PCN infusion at home and will only need to change bag once/day.    PA would like to check BMP tomorrow prior to d/c.  Will plan to d/c home tomorrow with nurse visit to start continuous infusion.

## 2018-09-04 NOTE — Progress Notes (Addendum)
      [address]       [place] [address]             [phone]      5 Days Post-Op Procedure(s) (LRB): WOUND VAC CHANGE, LEFT CHEST AND NECK, POSSIBLE DEBRIDEMENT (Left) APPLICATION OF WOUND VAC (Left)   Subjective:  Patient is doing well.  Has no complaints.  Objective: Vital signs in last 24 hours: Temp:  [98.1 F (36.7 C)-98.9 F (37.2 C)] 98.9 F (37.2 C) (01/25 0400) Pulse Rate:  [78-95] 85 (01/25 0400) Cardiac Rhythm: Normal sinus rhythm (01/24 1915) Resp:  [13-23] 13 (01/25 0659) BP: (123-150)/(81-91) 150/91 (01/25 0400) SpO2:  [97 %-100 %] 98 % (01/25 0400) Weight:  [90.5 kg] 90.5 kg (01/25 0659)  Intake/Output from previous day: 01/24 0701 - 01/25 0700 In: 1997 [P.O.:1330; I.V.:167; IV Piggyback:500] Out: 4775 [Urine:4775]  General appearance: alert, cooperative and no distress Heart: regular rate and rhythm Lungs: clear to auscultation bilaterally Abdomen: soft, non-tender; bowel sounds normal; no masses,  no organomegaly Extremities: extremities normal, atraumatic, no cyanosis or edema Wound: wound vac in place  Lab Results: Recent Labs    09/02/18 0307  WBC 8.1  HGB 7.0*  HCT 21.3*  PLT 364   BMET:  Recent Labs    09/02/18 0307 09/03/18 0410  NA 130* 130*  K 4.1 3.8  CL 99 98  CO2 24 25  GLUCOSE 163* 110*  BUN 18 19  CREATININE 1.52* 1.57*  CALCIUM 7.6* 7.7*    PT/INR: No results for input(s): LABPROT, INR in the last 72 hours. ABG No results found for: PHART, HCO3, TCO2, ACIDBASEDEF, O2SAT CBG (last 3)  Recent Labs    09/03/18 1609 09/03/18 2118 09/04/18 0601  GLUCAP 167* 105* 111*    Assessment/Plan: S/P Procedure(s) (LRB): WOUND VAC CHANGE, LEFT CHEST AND NECK, POSSIBLE DEBRIDEMENT (Left) APPLICATION OF WOUND VAC (Left)  1. CV- Sinus Tach stable, remains hypertensive- continue Lopressor, Norvasc, Lisinopril 2. Pulm- no acute issues, continue IS 3. Renal- weight continues to trend down, will start  oral lasix for a week, supplement K 4. ID- remains afebrile, continue ABX per ID 5. Dispo- patient stable, will ensure all home health arrangements have been made, plan for d/c today vs tomorrow   LOS: 18 days    Erin Barrett 09/04/2018   I have seen and examined Randy Oneal and agree with the above assessment  and plan.  Grace Isaac MD Beeper [phone] Office [phone] 09/04/2018 10:29 AM

## 2018-09-05 LAB — BASIC METABOLIC PANEL
Anion gap: 8 (ref 5–15)
BUN: 27 mg/dL — ABNORMAL HIGH (ref 6–20)
CO2: 26 mmol/L (ref 22–32)
Calcium: 7.7 mg/dL — ABNORMAL LOW (ref 8.9–10.3)
Chloride: 95 mmol/L — ABNORMAL LOW (ref 98–111)
Creatinine, Ser: 1.8 mg/dL — ABNORMAL HIGH (ref 0.61–1.24)
GFR calc Af Amer: 49 mL/min — ABNORMAL LOW (ref >60)
GFR calc non Af Amer: 42 mL/min — ABNORMAL LOW (ref >60)
Glucose, Bld: 184 mg/dL — ABNORMAL HIGH (ref 70–99)
Potassium: 4.4 mmol/L (ref 3.5–5.1)
Sodium: 129 mmol/L — ABNORMAL LOW (ref 135–145)

## 2018-09-05 LAB — GLUCOSE, CAPILLARY
Glucose-Capillary: 177 mg/dL — ABNORMAL HIGH (ref 70–99)
Glucose-Capillary: 236 mg/dL — ABNORMAL HIGH (ref 70–99)

## 2018-09-05 MED ORDER — POTASSIUM CHLORIDE CRYS ER 20 MEQ PO TBCR: 20.0000 meq | EXTENDED_RELEASE_TABLET | Freq: Every day | ORAL | 0 refills | Status: DC | PRN

## 2018-09-05 MED ORDER — METOPROLOL TARTRATE 50 MG PO TABS: 50.0000 mg | ORAL_TABLET | Freq: Two times a day (BID) | ORAL | 3 refills | Status: DC

## 2018-09-05 MED ORDER — FUROSEMIDE 40 MG PO TABS: 40.0000 mg | ORAL_TABLET | Freq: Every day | ORAL | 3 refills | Status: DC | PRN

## 2018-09-05 NOTE — Plan of Care (Signed)

## 2018-09-05 NOTE — Progress Notes (Signed)
      [address]       [place] [address]             [phone]      6 Days Post-Op Procedure(s) (LRB): WOUND VAC CHANGE, LEFT CHEST AND NECK, POSSIBLE DEBRIDEMENT (Left) APPLICATION OF WOUND VAC (Left)   Subjective:  No new complaints.   Feels good and is ready to go home.  Objective: Vital signs in last 24 hours: Temp:  [98.7 F (37.1 C)-101.4 F (38.6 C)] 98.7 F (37.1 C) (01/26 0400) Pulse Rate:  [86-94] 88 (01/26 0400) Cardiac Rhythm: Normal sinus rhythm (01/26 0410) Resp:  [13-20] 18 (01/26 0502) BP: (120-140)/(66-80) 128/71 (01/26 0400) SpO2:  [93 %-99 %] 99 % (01/26 0400) Weight:  [90.2 kg] 90.2 kg (01/26 0502)  Intake/Output from previous day: 01/25 0701 - 01/26 0700 In: 9758 [P.O.:390; I.V.:100; IV Piggyback:1250] Out: 3325 [Urine:2725; Drains:600]  General appearance: alert, cooperative and no distress Heart: regular rate and rhythm Lungs: clear to auscultation bilaterally Abdomen: soft, non-tender; bowel sounds normal; no masses,  no organomegaly Extremities: edema trace Wound: clean and dry  Lab Results: No results for input(s): WBC, HGB, HCT, PLT in the last 72 hours. BMET:  Recent Labs    09/03/18 0410 09/05/18 0313  NA 130* 129*  K 3.8 4.4  CL 98 95*  CO2 25 26  GLUCOSE 110* 184*  BUN 19 27*  CREATININE 1.57* 1.80*  CALCIUM 7.7* 7.7*    PT/INR: No results for input(s): LABPROT, INR in the last 72 hours. ABG No results found for: PHART, HCO3, TCO2, ACIDBASEDEF, O2SAT CBG (last 3)  Recent Labs    09/04/18 2121 09/04/18 2338 09/05/18 0616  GLUCAP 77 132* 177*    Assessment/Plan: S/P Procedure(s) (LRB): WOUND VAC CHANGE, LEFT CHEST AND NECK, POSSIBLE DEBRIDEMENT (Left) APPLICATION OF WOUND VAC (Left)  1. CV- Sinus Tach, rates elevated at times- will increase Lopressor to 50 mg BID, continue Norvasc, will stop home Lisinopril due to creatinine 2. Pulm-no acute issues, continue IS 3. Renal- creatinine is stable  at 1.8 which is about his baseline, weight is coming down, no edema on exam, will change lasix to prn dosing for weight gain, supplement K as well 4. ID- remains on ABX per ID completion date is 3/3, fever this morning was 101.5, retaken and was WNL 5. Dispo- patient stable, will d/c home today, all home health arrangements have been made   LOS: 19 days    Ellwood Handler 09/05/2018

## 2018-09-05 NOTE — Progress Notes (Signed)
Patient is discharged per MD orders. All discharge education and instructions were provided to the patient and all questions answered at the bedside. Patient will be transported home by his wife with all personal belongings. Wound vac dressing changed, and wound vac changed from [facility] hospital's wound vac. PICC line in place for IV antibiotics.

## 2018-09-06 ENCOUNTER — Telehealth: Payer: Self-pay | Admitting: Family Medicine

## 2018-09-06 ENCOUNTER — Other Ambulatory Visit: Payer: Self-pay | Admitting: Family Medicine

## 2018-09-06 ENCOUNTER — Other Ambulatory Visit: Payer: Self-pay | Admitting: "Endocrinology

## 2018-09-06 DIAGNOSIS — S91101A Unspecified open wound of right great toe without damage to nail, initial encounter: Secondary | ICD-10-CM

## 2018-09-06 DIAGNOSIS — E119 Type 2 diabetes mellitus without complications: Secondary | ICD-10-CM

## 2018-09-06 NOTE — Telephone Encounter (Signed)
Lets go ahead with podiatry referral May have approval for wound VAC treatments It is fine to go ahead with insulin increase of Lantus to 30 units Patient should check his sugars at least twice a day write these down for them to [place] on a weekly basis until he is seen by endocrinology  Also patient should do a follow-up visit with [place] somewhere within a month's time if possible  Nurses- please also make sure that this patient has a referral to endocrinology [facility]- patient desired not to follow-up with Dr. Dorris Fetch.

## 2018-09-06 NOTE — Telephone Encounter (Signed)
Order for: Nursing 3 week 9-wound back and IV penicillin  Also [facility] nurse would like to speak with [facility] nurse in regards of medication changes.

## 2018-09-06 NOTE — Telephone Encounter (Signed)
Contacted Randy Oneal and informed her that we would give a podiatry referral and endocrinology referral. Informed Randy Oneal that Dr Nicki Reaper approves of the wound vac treatments; informed of ok to increase Lantus. Check sugars at least twice daily and send them to [place] on a weekly basis until seen by endocrinology and for Randy Oneal to schedule follow up. Randy Oneal states she would let the Randy Oneal know. I contacted Randy Oneal and informed him of all the above and pt was transferred up front to get appt. Pt verbalized understanding. Referral to podiatry and endocrinology placed.

## 2018-09-06 NOTE — Telephone Encounter (Signed)
Spoke to [facility] Nurse). Randy Oneal states that patient had callus on right foot and has been seen by infectious disease. Pt will need podiatrist referral. Pt also has wound vac to left neck for abscess and pt is taking penicillin IV. Randy Oneal states that they are doing the labs for that.) nurse also states that the hospital increased lantus to 30 units  And decreased lisinopril 10 mg instead of 20 mg. Nurse states that BP was 140/80 today. Everything else is the same dose. Nurse would like Dr.Scott to approve all of this. Please advise. Thank you

## 2018-09-08 ENCOUNTER — Emergency Department (HOSPITAL_COMMUNITY): Payer: Managed Care, Other (non HMO)

## 2018-09-08 ENCOUNTER — Telehealth: Payer: Self-pay | Admitting: Infectious Disease

## 2018-09-08 ENCOUNTER — Emergency Department (HOSPITAL_COMMUNITY)
Admission: EM | Admit: 2018-09-08 | Discharge: 2018-09-08 | Disposition: A | Discharge status: Home / Self Care | Payer: Managed Care, Other (non HMO) | Attending: Emergency Medicine | Admitting: Emergency Medicine

## 2018-09-08 ENCOUNTER — Encounter (HOSPITAL_COMMUNITY): Payer: Self-pay | Admitting: Emergency Medicine

## 2018-09-08 DIAGNOSIS — I10 Essential (primary) hypertension: Secondary | ICD-10-CM | POA: Insufficient documentation

## 2018-09-08 DIAGNOSIS — Z794 Long term (current) use of insulin: Secondary | ICD-10-CM | POA: Diagnosis not present

## 2018-09-08 DIAGNOSIS — Y828 Other medical devices associated with adverse incidents: Secondary | ICD-10-CM | POA: Insufficient documentation

## 2018-09-08 DIAGNOSIS — B999 Unspecified infectious disease: Secondary | ICD-10-CM

## 2018-09-08 DIAGNOSIS — E119 Type 2 diabetes mellitus without complications: Secondary | ICD-10-CM | POA: Diagnosis not present

## 2018-09-08 DIAGNOSIS — T82398A Other mechanical complication of other vascular grafts, initial encounter: Secondary | ICD-10-CM | POA: Insufficient documentation

## 2018-09-08 DIAGNOSIS — Z452 Encounter for adjustment and management of vascular access device: Secondary | ICD-10-CM

## 2018-09-08 DIAGNOSIS — Z79899 Other long term (current) drug therapy: Secondary | ICD-10-CM | POA: Insufficient documentation

## 2018-09-08 DIAGNOSIS — M009 Pyogenic arthritis, unspecified: Secondary | ICD-10-CM

## 2018-09-08 MED ORDER — HEPARIN SOD (PORK) LOCK FLUSH 100 UNIT/ML IV SOLN
INTRAVENOUS | Status: AC
  Filled 2018-09-08: qty 5

## 2018-09-08 MED ORDER — AMOXICILLIN-POT CLAVULANATE 875-125 MG PO TABS: 1.0000 | ORAL_TABLET | Freq: Two times a day (BID) | ORAL | 1 refills | Status: DC

## 2018-09-08 MED ORDER — LIDOCAINE HCL 1 % IJ SOLN
INTRAMUSCULAR | Status: AC
  Filled 2018-09-08: qty 20

## 2018-09-08 NOTE — ED Provider Notes (Signed)
[facility] EMERGENCY DEPARTMENT Provider Note   CSN: [id number] Arrival date & time: 09/08/18  1237     History   Chief Complaint Chief Complaint  Patient presents with  . Vascular Access Problem    HPI Randy Oneal is a 53 y.o. male.  HPI Presents due to concern of a dislodged PICC line. Patient is receiving IV therapy several times daily for left clavicular abscess with wound VAC in place per He denies other complaints, including fever, pain around the area, notes the wound VAC itself is functioning seemingly appropriately, with drainage of material. He is unsure what happened, but he woke this morning with dislodged PICC line.  Past Medical History:  Diagnosis Date  . Diabetes mellitus    Insulin dependent  . Hypertension   . Renal insufficiency     Patient Active Problem List   Diagnosis Date Noted  . Open wound of right great toe   . Septic arthritis of left sternoclavicular joint ([place])   . 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 Cellulitis of great toe, right   . Diabetic hyperosmolar non-ketotic state ([place]) 08/17/2018  . Hyperglycemia 08/17/2018  . AKI (acute kidney injury) ([place]) 08/16/2018  . Hyponatremia 08/16/2018  . Hyperlipidemia associated with type 2 diabetes mellitus ([place]) 03/04/2017  . Erectile dysfunction 03/04/2017  . Personal history of noncompliance with medical treatment, presenting hazards to health 11/22/2015  . HTN (hypertension), benign 01/30/2014  . Loss of weight 01/30/2014  . Type 2 diabetes mellitus not at goal [facility]) 03/30/2013    Past Surgical History:  Procedure Laterality Date  . APPLICATION OF WOUND VAC Left 08/27/2018   Procedure: APPLICATION OF WOUND VAC, left neck;  Surgeon: Gaye Pollack, MD;  Location: [facility];  Service: Thoracic;  Laterality: Left;  . APPLICATION OF WOUND VAC Left 08/30/2018   Procedure: APPLICATION OF WOUND VAC;  Surgeon: Gaye Pollack, MD;  Location: [facility];  Service: Thoracic;   Laterality: Left;  . COLONOSCOPY  06/24/2011   Procedure: COLONOSCOPY;  Surgeon: [name], MD;  [facility];  Service: Endoscopy;  Laterality: N/A;  8:30 AM  . STERNAL WOUND DEBRIDEMENT Left 08/27/2018   Procedure: Incision and DEBRIDEMENT Left Chest, Neck and Mediastinum;  Surgeon: Gaye Pollack, MD;  Location: [facility] OR;  Service: Thoracic;  Laterality: Left;  . STERNAL WOUND DEBRIDEMENT Left 08/30/2018   Procedure: WOUND VAC CHANGE, LEFT CHEST AND NECK, POSSIBLE DEBRIDEMENT;  Surgeon: Gaye Pollack, MD;  Location: [facility] OR;  Service: Thoracic;  Laterality: Left;        Home Medications    Prior to Admission medications   Medication Sig Start Date End Date Taking? Authorizing Provider  acetaminophen (TYLENOL) 325 MG tablet Take 2 tablets (650 mg total) by mouth every 6 (six) hours as needed for mild pain (or Fever >/= 101). 09/04/18   Barrett, Erin R, PA-C  amLODipine (NORVASC) 10 MG tablet Take 1 tablet (10 mg total) by mouth daily. 01/26/18   [name], MD  amoxicillin-clavulanate (AUGMENTIN) 875-125 MG tablet Take 1 tablet by mouth 2 (two) times daily. To be taken IF you do not have IV access to get PCN 09/08/18   Tommy Medal, Lavell Islam, MD  atorvastatin (LIPITOR) 10 MG tablet Take 1 tablet (10 mg total) by mouth daily. 01/26/18   [name], MD  Continuous Blood Gluc Sensor ([place]) MISC Use one sensor every 10 days. 08/10/17   [name],  MD  furosemide (LASIX) 40 MG tablet Take 1 tablet (40 mg total) by mouth daily as needed. Take for weight gain of 3-5 lbs 09/05/18   Barrett, Erin R, PA-C  glucose blood (BAYER CONTOUR NEXT TEST) test strip USE TO TEST FOUR TIMES DAILY. 07/28/17   [name], MD  ibuprofen (ADVIL,MOTRIN) 200 MG tablet Take 400 mg by mouth every 6 (six) hours as needed for mild pain or moderate pain.    [provider]  Ibuprofen-diphenhydrAMINE Cit (ADVIL PM) 200-38 MG TABS Take 2 tablets by mouth  at bedtime as needed (for pain/sleep).    [provider]  insulin glargine (LANTUS) 100 UNIT/ML injection Inject 0.3 mLs (30 Units total) into the skin at bedtime. 08/16/18   [name], MD  insulin lispro (HUMALOG) 100 UNIT/ML cartridge Inject 0.08 mLs (8 Units total) into the skin 3 (three) times daily with meals. 08/16/18   [name], MD  Insulin Pen Needle (B-D ULTRAFINE [place] SHORT PEN) 31G X 8 MM MISC 1 each by Does not apply route as directed. 12/07/15   [name], MD  metoprolol tartrate (LOPRESSOR) 50 MG tablet Take 1 tablet (50 mg total) by mouth 2 (two) times daily. 09/05/18   Barrett, Erin R, PA-C  MICROLET LANCETS MISC USE FOUR TIMES A DAY AS DIRECTED. 11/19/16   [name], [name], MD  NOVOFINE 32G X 6 MM MISC USE AS DIRECTED WITH INSULIN PEN FOUR TIMES DAILY. 11/20/14   [name], MD  nystatin (MYCOSTATIN/NYSTOP) powder Apply topically 2 (two) times daily. To groin and scrotum 09/04/18   Barrett, Erin R, PA-C  oxyCODONE 10 MG TABS Take 1 tablet (10 mg total) by mouth every 4 (four) hours as needed for moderate pain or severe pain. 09/04/18   Barrett, Erin R, PA-C  penicillin G IVPB Inject 24 Million Units into the vein daily. Via continuous infusion. Indication: Bacteremia Last Day of Therapy:  10/12/2018 Labs - Once weekly:  CBC/D and BMP, Labs - Every other week:  ESR and CRP 09/04/18 10/17/18  Barrett, Erin R, PA-C  penicillin G potassium 4 Million Units in dextrose 5 % 250 mL Inject 4 Million Units into the vein every 4 (four) hours. 09/04/18 10/12/18  Barrett, Erin R, PA-C  potassium chloride SA (K-DUR,KLOR-CON) 20 MEQ tablet Take 1 tablet (20 mEq total) by mouth daily as needed. Only take on days you use Lasix 09/05/18   Barrett, Erin R, PA-C  sildenafil (REVATIO) 20 MG tablet May take up to 5 before relations as directed 03/04/17   [name], MD  sulfamethoxazole-trimethoprim (BACTRIM DS,SEPTRA DS) 800-160 MG tablet Take 1 tablet by mouth 2 (two) times  daily. 08/16/18   [name], MD    Family History Family History  Problem Relation Age of Onset  . Hypertension Mother   . Colon cancer Neg Hx     Social History Social History   Tobacco Use  . Smoking status: Never Smoker  . Smokeless tobacco: Never Used  Substance Use Topics  . Alcohol use: No  . Drug use: No     Allergies   Patient has no known allergies.   Review of Systems Review of Systems  Constitutional:       Per HPI, otherwise negative  HENT:       Per HPI, otherwise negative  Respiratory:       Per HPI, otherwise negative  Cardiovascular:       Per HPI, otherwise negative  Gastrointestinal:  Negative for vomiting.  Endocrine:       Negative aside from HPI  Genitourinary:       Neg aside from HPI   Musculoskeletal:       Per HPI, otherwise negative  Skin: Positive for color change.  Neurological: Negative for syncope.     Physical Exam Updated Vital Signs BP 139/86   Pulse 87   Temp 98.7 F (37.1 C) (Oral)   SpO2 100%   Physical Exam Vitals signs and nursing note reviewed.  Constitutional:      General: He is not in acute distress.    Appearance: He is well-developed.  HENT:     Head: Normocephalic and atraumatic.  Eyes:     Conjunctiva/sclera: Conjunctivae normal.  Cardiovascular:     Rate and Rhythm: Normal rate and regular rhythm.  Pulmonary:     Effort: Pulmonary effort is normal. No respiratory distress.     Breath sounds: No stridor.  Abdominal:     General: There is no distension.  Musculoskeletal:     Comments: Wound VAC in place left clavicle, unremarkable otherwise.  Right upper extremity with area where former PICC line was, unremarkable.  Skin:    General: Skin is warm and dry.  Neurological:     Mental Status: He is alert and oriented to person, place, and time.      ED Treatments / Results   Procedures Procedures (including critical care time)  Medications Ordered in ED Medications  lidocaine  (XYLOCAINE) 1 % (with pres) injection (has no administration in time range)  heparin lock flush 100 UNIT/ML injection (has no administration in time range)     Initial Impression / Assessment and Plan / ED Course  I have reviewed the triage vital signs and the nursing notes.  Pertinent labs & imaging results that were available during my care of the patient were reviewed by me and considered in my medical decision making (see chart for details).  He has had successful repletion of his PICC line by our interventional radiology colleagues, without complications, with appropriate flushing, was discharged in stable condition.  Final Clinical Impressions(s) / ED Diagnoses   Final diagnoses:  Infection  Needs peripherally inserted central catheter (PICC)      Carmin Muskrat, MD 09/08/18 1615

## 2018-09-08 NOTE — ED Triage Notes (Signed)
Pt woke up and PICC line out. Site looks. Pt reports PICC line looked intact and home health nurse came to house and looked at it. Site looks good. Dressing removed. Gets penicillin for abscess (wound vac) on clavicle L side.

## 2018-09-08 NOTE — ED Notes (Signed)
Pt remains in IR

## 2018-09-08 NOTE — Discharge Instructions (Addendum)
As discussed, your evaluation today has been largely reassuring.  But, it is important that you monitor your condition carefully, and do not hesitate to return to the ED if you develop new, or concerning changes in your condition. ? ?Otherwise, please follow-up with your physician for appropriate ongoing care. ? ?

## 2018-09-08 NOTE — Progress Notes (Signed)
Since PICC line came out.  He is going to come to [facility] today we will try to get him plugged in with interventional radiology have a new central line placed.  I really want to make sure he is getting systemic IV antibiotics to treat his severe sternoclavicular joint infection and chest infection

## 2018-09-08 NOTE — ED Notes (Signed)
IR called and will consent pt and do procedure

## 2018-09-08 NOTE — Telephone Encounter (Signed)
Home Health nurse called due to patient PICC line out and lying on the couch when she arrived with dressing intact. LPN spoke with Dr. Tommy Medal for advise verbal order per MD to have Picc line replaced at [facility] and to use Augentin 875-125mg  tablet take 1 tablet by mouth 2 times a day if patient does not have IV access to get PCN. Per [facility] patient's wife will be home shortly to transfer patient to [facility].  S.Saxton Chain, LPN

## 2018-09-09 ENCOUNTER — Encounter: Payer: Self-pay | Admitting: Family Medicine

## 2018-09-10 ENCOUNTER — Telehealth: Payer: Self-pay | Admitting: Family Medicine

## 2018-09-10 NOTE — Telephone Encounter (Signed)
FYI

## 2018-09-10 NOTE — Telephone Encounter (Signed)
Please call Randy Oneal We appreciate her call Let her know that we are keenly aware of this patient's gaps Unfortunately this patient has severe noncompliance and was recently in the hospital with life-threatening diabetic complications I even went to the hospital to talk with him to discuss how important it was to get his diabetes better Short of other measures we have done everything we can to help to try to get this gentleman to be compliant So once again we appreciate them pointing out what we are aware of This is not a situation of [place] not doing our job This is a situation of a patient that does not want to do what has been pointed out to him numerous times

## 2018-09-10 NOTE — Telephone Encounter (Signed)
Daleen Snook w/[employer] called to notify us that the patient has had some gaps in care  Pt is due for an A1C, diabetic eye exam, colorectal screening, and is not medication compliant   Questions or concerns, please call Daleen Snook [phone], ext# [phone]

## 2018-09-14 ENCOUNTER — Telehealth: Payer: Self-pay | Admitting: Family Medicine

## 2018-09-14 NOTE — Telephone Encounter (Signed)
Patient feels like he has to go and sits on toilet but can't go to bathroom -please advise

## 2018-09-14 NOTE — Telephone Encounter (Signed)
Patient advised per Dr Nicki Reaper :  he is going to need to do several things Dr Nicki Reaper would recommend using enema to try to help him have a bowel movement He will need to get his wife to help him with this Would also recommend generic MiraLAX available over-the-counter 1 capful into 8 ounces of water on a daily basis He may also take Dulcolax tablets as needed to help If he has ongoing troubles or if the enema is not helping he needs to let us know Patient verbalized understanding.

## 2018-09-14 NOTE — Telephone Encounter (Signed)
Still he is going to need to do several things I would recommend using enema to try to help him have a bowel movement He will need to get his wife to help him with this Would also recommend generic MiraLAX available over-the-counter 1 capful into 8 ounces of water on a daily basis He may also take Dulcolax tablets as needed to help If he has ongoing troubles or if the enema is not helping he needs to let us know

## 2018-09-14 NOTE — Telephone Encounter (Signed)
Needing medication for constipation, has not had a complete bowel movement since discharged from hospital.    Pharmacy:  [facility], [place]

## 2018-09-15 ENCOUNTER — Encounter: Payer: Self-pay | Admitting: Surgery

## 2018-09-15 ENCOUNTER — Ambulatory Visit (INDEPENDENT_AMBULATORY_CARE_PROVIDER_SITE_OTHER): Payer: Managed Care, Other (non HMO) | Admitting: Surgery

## 2018-09-15 ENCOUNTER — Other Ambulatory Visit: Payer: Self-pay

## 2018-09-15 VITALS — BP 135/83 | HR 92 | Resp 16 | Ht 70.0 in

## 2018-09-15 DIAGNOSIS — Z713 Dietary counseling and surveillance: Secondary | ICD-10-CM | POA: Insufficient documentation

## 2018-09-15 DIAGNOSIS — M009 Pyogenic arthritis, unspecified: Secondary | ICD-10-CM | POA: Diagnosis not present

## 2018-09-15 DIAGNOSIS — Z9119 Patient's noncompliance with other medical treatment and regimen: Secondary | ICD-10-CM | POA: Insufficient documentation

## 2018-09-15 DIAGNOSIS — Z4801 Encounter for change or removal of surgical wound dressing: Secondary | ICD-10-CM

## 2018-09-15 DIAGNOSIS — Z91199 Patient's noncompliance with other medical treatment and regimen due to unspecified reason: Secondary | ICD-10-CM | POA: Insufficient documentation

## 2018-09-16 ENCOUNTER — Encounter: Payer: Self-pay | Admitting: Internal Medicine

## 2018-09-16 ENCOUNTER — Encounter: Payer: Self-pay | Admitting: Surgery

## 2018-09-16 NOTE — Progress Notes (Signed)
HPI:  The patient returns for follow-up status post incision and drainage of a left sternoclavicular joint abscess with extension into the left supraclavicular fossa and retromanubrial space with application of a wound VAC on 08/27/2018.  This resulted from bacteremia after he presented with a right great toe infection following his attempted debridement of a thick callus on the toe.  Blood cultures grew group B strep and he presented with sepsis and DKA.  He returned to the operating room on 08/30/2018 for reexamination of the wound and VAC change under anesthesia.  The wound looked clean.  He was discharged home on intravenous penicillin via a PICC line per infectious disease recommendation with plans for a 6-week course of treatment.  He says that he has been feeling well.  The home health nurses have been changing the wound VAC 3 times per week.  He has had no fever or chills.  He denies any pain.  Current Outpatient Medications  Medication Sig Dispense Refill  . acetaminophen (TYLENOL) 325 MG tablet Take 2 tablets (650 mg total) by mouth every 6 (six) hours as needed for mild pain (or Fever >/= 101).    Marland Kitchen amLODipine (NORVASC) 10 MG tablet Take 1 tablet (10 mg total) by mouth daily. 30 tablet 6  . amoxicillin-clavulanate (AUGMENTIN) 875-125 MG tablet Take 1 tablet by mouth 2 (two) times daily. To be taken IF you do not have IV access to get PCN 60 tablet 1  . atorvastatin (LIPITOR) 10 MG tablet Take 1 tablet (10 mg total) by mouth daily. 30 tablet 06  . Continuous Blood Gluc Sensor (FREESTYLE LIBRE SENSOR SYSTEM) MISC Use one sensor every 10 days. 3 each 2  . furosemide (LASIX) 40 MG tablet Take 1 tablet (40 mg total) by mouth daily as needed. Take for weight gain of 3-5 lbs 30 tablet 3  . glucose blood (BAYER CONTOUR NEXT TEST) test strip USE TO TEST FOUR TIMES DAILY. 150 each 2  . ibuprofen (ADVIL,MOTRIN) 200 MG tablet Take 400 mg by mouth every 6 (six) hours as needed for mild pain or  moderate pain.    . Ibuprofen-diphenhydrAMINE Cit (ADVIL PM) 200-38 MG TABS Take 2 tablets by mouth at bedtime as needed (for pain/sleep).    . insulin glargine (LANTUS) 100 UNIT/ML injection Inject 0.3 mLs (30 Units total) into the skin at bedtime. 10 mL 1  . insulin lispro (HUMALOG) 100 UNIT/ML cartridge Inject 0.08 mLs (8 Units total) into the skin 3 (three) times daily with meals. 15 mL 1  . Insulin Pen Needle (B-D ULTRAFINE III SHORT PEN) 31G X 8 MM MISC 1 each by Does not apply route as directed. 150 each 3  . metoprolol tartrate (LOPRESSOR) 50 MG tablet Take 1 tablet (50 mg total) by mouth 2 (two) times daily. 60 tablet 3  . MICROLET LANCETS MISC USE FOUR TIMES A DAY AS DIRECTED. 100 each 0  . NOVOFINE 32G X 6 MM MISC USE AS DIRECTED WITH INSULIN PEN FOUR TIMES DAILY. 100 each 0  . nystatin (MYCOSTATIN/NYSTOP) powder Apply topically 2 (two) times daily. To groin and scrotum 15 g 0  . oxyCODONE 10 MG TABS Take 1 tablet (10 mg total) by mouth every 4 (four) hours as needed for moderate pain or severe pain. 42 tablet 0  . penicillin G IVPB Inject 24 Million Units into the vein daily. Via continuous infusion. Indication: Bacteremia Last Day of Therapy:  10/12/2018 Labs - Once weekly:  CBC/D and BMP,  Labs - Every other week:  ESR and CRP 43 Units 0  . penicillin G potassium 4 Million Units in dextrose 5 % 250 mL Inject 4 Million Units into the vein every 4 (four) hours. 228 each 0  . potassium chloride SA (K-DUR,KLOR-CON) 20 MEQ tablet Take 1 tablet (20 mEq total) by mouth daily as needed. Only take on days you use Lasix 30 tablet 0  . sildenafil (REVATIO) 20 MG tablet May take up to 5 before relations as directed 50 tablet 4  . TRESIBA FLEXTOUCH 100 UNIT/ML SOPN FlexTouch Pen      No current facility-administered medications for this visit.      Physical Exam: BP 135/83 (BP Location: Left Arm, Patient Position: Sitting, Cuff Size: Large)   Pulse 92   Resp 16   Ht '5\' 10"'$  (1.778 m)   SpO2  93%   BMI 28.52 kg/m  He looks well.  The wound VAC was changed.  The wound is granulating well.  The supraclavicular fossa wound is essentially closed.  There is a space extending down into the supraclavicular joint but it appears clean.  The bone appears viable.  There is no drainage.  The wound VAC sponge was replaced.  Diagnostic Tests:  None today  Impression:  The wound appears clean and is closing in.  The wound VAC sponge was changed and we tried to pack part of the sponge into the supraclavicular joint so that it heals from deep to superficial.  He will continue on intravenous antibiotics and have the wound VAC changed by the home health nurse.  I suspect that they will probably need to change to a white sponge instead of the black sponge since it is more superficial.  Plan:  I will plan to see him back in 1 week to reexamine the wound and change his wound VAC.   Gaye Pollack, MD [facility] [phone]

## 2018-09-17 ENCOUNTER — Encounter: Payer: Managed Care, Other (non HMO) | Admitting: Podiatry

## 2018-09-22 ENCOUNTER — Other Ambulatory Visit: Payer: Self-pay

## 2018-09-22 ENCOUNTER — Encounter: Payer: Self-pay | Admitting: Surgery

## 2018-09-22 ENCOUNTER — Ambulatory Visit: Payer: Managed Care, Other (non HMO) | Admitting: Surgery

## 2018-09-22 VITALS — BP 141/86 | HR 84 | Resp 16 | Ht 70.0 in

## 2018-09-22 DIAGNOSIS — M009 Pyogenic arthritis, unspecified: Secondary | ICD-10-CM

## 2018-09-22 DIAGNOSIS — Z4801 Encounter for change or removal of surgical wound dressing: Secondary | ICD-10-CM

## 2018-09-24 ENCOUNTER — Encounter: Payer: Self-pay | Admitting: Surgery

## 2018-09-24 NOTE — Progress Notes (Signed)
HPI:  The patient returns for follow-up status post incision and drainage of a left sternoclavicular joint abscess with extension into the left supraclavicular fossa and retromanubrial space with application of a wound VAC on 08/27/2018.  This resulted from bacteremia after he presented with a right great toe infection following his attempted debridement of a thick callus on the toe.  Blood cultures grew group B strep and he presented with sepsis and DKA.  He returned to the operating room on 08/30/2018 for reexamination of the wound and VAC change under anesthesia.  The wound looked clean.  He was discharged home on intravenous penicillin via a PICC line per infectious disease recommendation with plans for a 6-week course of treatment. Since I saw him last week he has been feeling well. Home health nursing is still changing his wound VAC three times per week. He denies any fever or chills.  Current Outpatient Medications  Medication Sig Dispense Refill  . acetaminophen (TYLENOL) 325 MG tablet Take 2 tablets (650 mg total) by mouth every 6 (six) hours as needed for mild pain (or Fever >/= 101).    Marland Kitchen amLODipine (NORVASC) 10 MG tablet Take 1 tablet (10 mg total) by mouth daily. 30 tablet 6  . amoxicillin-clavulanate (AUGMENTIN) 875-125 MG tablet Take 1 tablet by mouth 2 (two) times daily. To be taken IF you do not have IV access to get PCN 60 tablet 1  . atorvastatin (LIPITOR) 10 MG tablet Take 1 tablet (10 mg total) by mouth daily. 30 tablet 06  . Continuous Blood Gluc Sensor (FREESTYLE LIBRE SENSOR SYSTEM) MISC Use one sensor every 10 days. 3 each 2  . furosemide (LASIX) 40 MG tablet Take 1 tablet (40 mg total) by mouth daily as needed. Take for weight gain of 3-5 lbs 30 tablet 3  . glucose blood (BAYER CONTOUR NEXT TEST) test strip USE TO TEST FOUR TIMES DAILY. 150 each 2  . ibuprofen (ADVIL,MOTRIN) 200 MG tablet Take 400 mg by mouth every 6 (six) hours as needed for mild pain or moderate pain.     . Ibuprofen-diphenhydrAMINE Cit (ADVIL PM) 200-38 MG TABS Take 2 tablets by mouth at bedtime as needed (for pain/sleep).    . insulin glargine (LANTUS) 100 UNIT/ML injection Inject 0.3 mLs (30 Units total) into the skin at bedtime. 10 mL 1  . insulin lispro (HUMALOG) 100 UNIT/ML cartridge Inject 0.08 mLs (8 Units total) into the skin 3 (three) times daily with meals. 15 mL 1  . Insulin Pen Needle (B-D ULTRAFINE III SHORT PEN) 31G X 8 MM MISC 1 each by Does not apply route as directed. 150 each 3  . metoprolol tartrate (LOPRESSOR) 50 MG tablet Take 1 tablet (50 mg total) by mouth 2 (two) times daily. 60 tablet 3  . MICROLET LANCETS MISC USE FOUR TIMES A DAY AS DIRECTED. 100 each 0  . NOVOFINE 32G X 6 MM MISC USE AS DIRECTED WITH INSULIN PEN FOUR TIMES DAILY. 100 each 0  . nystatin (MYCOSTATIN/NYSTOP) powder Apply topically 2 (two) times daily. To groin and scrotum 15 g 0  . oxyCODONE 10 MG TABS Take 1 tablet (10 mg total) by mouth every 4 (four) hours as needed for moderate pain or severe pain. 42 tablet 0  . penicillin G IVPB Inject 24 Million Units into the vein daily. Via continuous infusion. Indication: Bacteremia Last Day of Therapy:  10/12/2018 Labs - Once weekly:  CBC/D and BMP, Labs - Every other week:  ESR  and CRP 43 Units 0  . penicillin G potassium 4 Million Units in dextrose 5 % 250 mL Inject 4 Million Units into the vein every 4 (four) hours. 228 each 0  . potassium chloride SA (K-DUR,KLOR-CON) 20 MEQ tablet Take 1 tablet (20 mEq total) by mouth daily as needed. Only take on days you use Lasix 30 tablet 0  . sildenafil (REVATIO) 20 MG tablet May take up to 5 before relations as directed 50 tablet 4  . TRESIBA FLEXTOUCH 100 UNIT/ML SOPN FlexTouch Pen      No current facility-administered medications for this visit.      Physical Exam: BP (!) 141/86 (BP Location: Left Arm, Patient Position: Sitting, Cuff Size: Large)   Pulse 84   Resp 16   Ht '5\' 10"'$  (1.778 m)   SpO2 99% Comment:  ON RA  BMI 28.52 kg/m  He looks well The wound is clean and granulating. There is one deeper area which is essentially down into the sternoclavicular joint but the depth of the wound looks clean without any drainage.  Diagnostic Tests:  None today  Impression:  The wound is clean and healing in as expected. The deep area in the sternoclavicular joint requires packing a piece of sponge down into it. The wound VAC was changed by our nurse Jolene.  Plan:  He will continue VAC changes by [facility] and we will see him back in one week for reevaluation of the wound and VAC change.   Gaye Pollack, MD [facility] [phone]

## 2018-09-27 DIAGNOSIS — Z029 Encounter for administrative examinations, unspecified: Secondary | ICD-10-CM

## 2018-09-28 ENCOUNTER — Encounter: Payer: Self-pay | Admitting: Family Medicine

## 2018-09-28 ENCOUNTER — Ambulatory Visit: Payer: Managed Care, Other (non HMO) | Admitting: Family Medicine

## 2018-09-28 VITALS — Wt 185.8 lb

## 2018-09-28 DIAGNOSIS — I10 Essential (primary) hypertension: Secondary | ICD-10-CM | POA: Diagnosis not present

## 2018-09-28 DIAGNOSIS — E119 Type 2 diabetes mellitus without complications: Secondary | ICD-10-CM

## 2018-09-28 DIAGNOSIS — E1169 Type 2 diabetes mellitus with other specified complication: Secondary | ICD-10-CM | POA: Diagnosis not present

## 2018-09-28 DIAGNOSIS — E785 Hyperlipidemia, unspecified: Secondary | ICD-10-CM

## 2018-09-28 DIAGNOSIS — M6281 Muscle weakness (generalized): Secondary | ICD-10-CM | POA: Diagnosis not present

## 2018-09-28 MED ORDER — METOPROLOL TARTRATE 50 MG PO TABS: ORAL_TABLET | ORAL | 3 refills | Status: DC

## 2018-09-28 MED ORDER — AMLODIPINE BESYLATE 5 MG PO TABS: ORAL_TABLET | ORAL | 1 refills | Status: DC

## 2018-09-28 NOTE — Progress Notes (Addendum)
Subjective:    Patient ID: Randy Oneal, male    DOB: [birth year], 53 y.o.   MRN: [id number]  HPI Pt here today for hospital follow up. Pt states he getting better slow.   Pt has form for short term disability to be filled out by provider.  This patient was in the hospital for several weeks plus also has been at home on a wound VAC as well as IV antibiotics through a PICC line patient has severe diabetes and had recent abscess of the thoracic cavity as well as sepsis and infection of the foot in addition to this the patient is unable to go back to work because of the healing abscess and severe deconditioning  Patient has brittle diabetes for years did not eat properly did not do proper management would not follow through.  He is now motivated to do better and his wife is taking additional responsibilities to watch after his dietary habits  Patient is under the care of thoracic surgery Dr. Caffie Pinto Patient is also under the care of endocrinology for diabetes  He also has high blood pressure and hyperlipidemia.  No lab work for today but we did review over his medication. Review of Systems  Constitutional: Negative for diaphoresis and fatigue.  HENT: Negative for congestion and rhinorrhea.   Respiratory: Negative for cough and shortness of breath.   Cardiovascular: Negative for chest pain and leg swelling.  Gastrointestinal: Negative for abdominal pain and diarrhea.  Skin: Negative for color change and rash.  Neurological: Negative for dizziness and headaches.  Psychiatric/Behavioral: Negative for behavioral problems and confusion.       Objective:   Physical Exam Vitals signs reviewed.  Constitutional:      General: He is not in acute distress. HENT:     Head: Normocephalic and atraumatic.  Eyes:     General:        Right eye: No discharge.        Left eye: No discharge.  Neck:     Trachea: No tracheal deviation.  Cardiovascular:     Rate and Rhythm: Normal rate and regular  rhythm.     Heart sounds: Normal heart sounds. No murmur.  Pulmonary:     Effort: Pulmonary effort is normal. No respiratory distress.     Breath sounds: Normal breath sounds.  Lymphadenopathy:     Cervical: No cervical adenopathy.  Skin:    General: Skin is warm and dry.  Neurological:     Mental Status: He is alert.     Coordination: Coordination normal.  Psychiatric:        Behavior: Behavior normal.   This patient has significant muscle weakness and muscle wasting of the legs and upper arms related to his long hospitalization and convalescent spell        Assessment & Plan:  Diabetes subpar control He does need to be established with endocrinology we will go ahead with referral He will monitor her sugars on a close basis and we will provide advice until he is established with endocrinology  Thoracic abscess-has wound VAC he is to continue this he will see specialist tomorrow they will help guide him regarding what activity he is allowed to do Currently right now patient has severe muscle wasting and weakness.  He is unable to do sedentary work or vigorous work.  He is unable to work currently.  He is disabled.  He will be disabled until his thoracic surgeon releases him from his wound VAC and  allows him to start conditioning.  Because of the abscess he also has a PICC line he will need to continue with this over the course of the next several weeks until it is authorized by the thoracic surgeon to be removed   Muscle weakness fatigue tiredness and deconditioning home physical therapy would be beneficial patient is homebound can only get out with the assistance of family and the only does so maybe once a week  25 minutes was spent with the patient.  This statement verifies that 25 minutes was indeed spent with the patient.  More than 50% of this visit-total duration of the visit-was spent in counseling and coordination of care. The issues that the patient came in for today as  reflected in the diagnosis (s) please refer to documentation for further details.  Mild orthostasis reduce amlodipine to 5 mg reduce metoprolol down to half a tablet twice daily follow-up in several weeks to recheck his overall health conditioning  Patient unable to work because of the above-stated problems Patient will be following up in the near future.

## 2018-09-29 ENCOUNTER — Ambulatory Visit (INDEPENDENT_AMBULATORY_CARE_PROVIDER_SITE_OTHER): Payer: Managed Care, Other (non HMO) | Admitting: Surgery

## 2018-09-29 ENCOUNTER — Ambulatory Visit (INDEPENDENT_AMBULATORY_CARE_PROVIDER_SITE_OTHER): Payer: Managed Care, Other (non HMO)

## 2018-09-29 ENCOUNTER — Ambulatory Visit: Payer: Managed Care, Other (non HMO) | Admitting: Podiatrist

## 2018-09-29 ENCOUNTER — Other Ambulatory Visit: Payer: Self-pay | Admitting: Podiatrist

## 2018-09-29 VITALS — Temp 100.2°F

## 2018-09-29 DIAGNOSIS — M79671 Pain in right foot: Secondary | ICD-10-CM | POA: Diagnosis not present

## 2018-09-29 DIAGNOSIS — L97512 Non-pressure chronic ulcer of other part of right foot with fat layer exposed: Secondary | ICD-10-CM

## 2018-09-29 DIAGNOSIS — L97421 Non-pressure chronic ulcer of left heel and midfoot limited to breakdown of skin: Secondary | ICD-10-CM | POA: Diagnosis not present

## 2018-09-29 DIAGNOSIS — Z4801 Encounter for change or removal of surgical wound dressing: Secondary | ICD-10-CM | POA: Diagnosis not present

## 2018-09-29 DIAGNOSIS — M009 Pyogenic arthritis, unspecified: Secondary | ICD-10-CM

## 2018-10-01 ENCOUNTER — Encounter: Payer: Self-pay | Admitting: Family Medicine

## 2018-10-04 ENCOUNTER — Encounter: Payer: Self-pay | Admitting: Family Medicine

## 2018-10-04 ENCOUNTER — Encounter: Payer: Self-pay | Admitting: Surgery

## 2018-10-04 NOTE — Progress Notes (Signed)
HPI:  The patient returns for follow-up status post incision and drainage of a left sternoclavicular joint abscess with extension into the left supraclavicular fossa and retromanubrial space with application of a wound VAC on 08/27/2018. This resulted from bacteremia after he presented with a right great toe infection following his attempted debridement of a thick callus on the toe. Blood cultures grew group B strep and he presented with sepsis and DKA. He returned to the operating room on 08/30/2018 for reexamination of the wound and VAC change under anesthesia. The wound looked clean. He was discharged home on intravenous penicillin via a PICC line perinfectious disease recommendation with plans for a 6-week course of treatment. Home health nursing is still changing his wound VAC three times per week. He denies any fever or chills.  I have been seeing him weekly in the office to evaluate the wound and change the wound VAC.  Current Outpatient Medications  Medication Sig Dispense Refill  . acetaminophen (TYLENOL) 325 MG tablet Take 2 tablets (650 mg total) by mouth every 6 (six) hours as needed for mild pain (or Fever >/= 101). (Patient not taking: Reported on 09/28/2018)    . amLODipine (NORVASC) 5 MG tablet Take one tablet by mouth each day 90 tablet 1  . amoxicillin-clavulanate (AUGMENTIN) 875-125 MG tablet Take 1 tablet by mouth 2 (two) times daily. To be taken IF you do not have IV access to get PCN (Patient not taking: Reported on 09/28/2018) 60 tablet 1  . atorvastatin (LIPITOR) 10 MG tablet Take 1 tablet (10 mg total) by mouth daily. 30 tablet 06  . Continuous Blood Gluc Sensor (FREESTYLE LIBRE SENSOR SYSTEM) MISC Use one sensor every 10 days. 3 each 2  . furosemide (LASIX) 40 MG tablet Take 1 tablet (40 mg total) by mouth daily as needed. Take for weight gain of 3-5 lbs (Patient not taking: Reported on 09/28/2018) 30 tablet 3  . glucose blood (BAYER CONTOUR NEXT TEST) test strip USE TO  TEST FOUR TIMES DAILY. 150 each 2  . ibuprofen (ADVIL,MOTRIN) 200 MG tablet Take 400 mg by mouth every 6 (six) hours as needed for mild pain or moderate pain.    . Ibuprofen-diphenhydrAMINE Cit (ADVIL PM) 200-38 MG TABS Take 2 tablets by mouth at bedtime as needed (for pain/sleep).    . insulin glargine (LANTUS) 100 UNIT/ML injection Inject 0.3 mLs (30 Units total) into the skin at bedtime. (Patient not taking: Reported on 09/28/2018) 10 mL 1  . insulin lispro (HUMALOG) 100 UNIT/ML cartridge Inject 0.08 mLs (8 Units total) into the skin 3 (three) times daily with meals. (Patient not taking: Reported on 09/28/2018) 15 mL 1  . Insulin Pen Needle (B-D ULTRAFINE III SHORT PEN) 31G X 8 MM MISC 1 each by Does not apply route as directed. 150 each 3  . metoprolol tartrate (LOPRESSOR) 50 MG tablet Take one half tablet by mouth twice daily 60 tablet 3  . MICROLET LANCETS MISC USE FOUR TIMES A DAY AS DIRECTED. 100 each 0  . NOVOFINE 32G X 6 MM MISC USE AS DIRECTED WITH INSULIN PEN FOUR TIMES DAILY. 100 each 0  . nystatin (MYCOSTATIN/NYSTOP) powder Apply topically 2 (two) times daily. To groin and scrotum 15 g 0  . oxyCODONE 10 MG TABS Take 1 tablet (10 mg total) by mouth every 4 (four) hours as needed for moderate pain or severe pain. (Patient not taking: Reported on 09/28/2018) 42 tablet 0  . penicillin G IVPB Inject 24 Million  Units into the vein daily. Via continuous infusion. Indication: Bacteremia Last Day of Therapy:  10/12/2018 Labs - Once weekly:  CBC/D and BMP, Labs - Every other week:  ESR and CRP 43 Units 0  . penicillin G potassium 4 Million Units in dextrose 5 % 250 mL Inject 4 Million Units into the vein every 4 (four) hours. 228 each 0  . potassium chloride SA (K-DUR,KLOR-CON) 20 MEQ tablet Take 1 tablet (20 mEq total) by mouth daily as needed. Only take on days you use Lasix (Patient not taking: Reported on 09/28/2018) 30 tablet 0  . sildenafil (REVATIO) 20 MG tablet May take up to 5 before  relations as directed (Patient not taking: Reported on 09/28/2018) 50 tablet 4  . TRESIBA FLEXTOUCH 100 UNIT/ML SOPN FlexTouch Pen      No current facility-administered medications for this visit.      Physical Exam: He looks well. The wound is clean and granulating.  The deeper area down into the left sternoclavicular joint is clean without any drainage.  The ends of the bone are covered with granulation tissue.  Diagnostic Tests:  None today  Impression:  The chest wound is clean and granulating.  There is still one deeper area down into the sternoclavicular joint which requires packing a piece of sponge into it.  The wound VAC was changed today by our nurse Jolene without difficulty.  Patient tolerates it well.  Plan:  We will continue VAC changes by the home health RN as well as continued intravenous antibiotics.  I will see him back next Wednesday for reevaluation and wound VAC change.   Gaye Pollack, MD [facility] [phone]

## 2018-10-05 ENCOUNTER — Inpatient Hospital Stay: Payer: Managed Care, Other (non HMO) | Admitting: Infectious Disease

## 2018-10-05 ENCOUNTER — Encounter: Payer: Self-pay | Admitting: Infectious Disease

## 2018-10-05 ENCOUNTER — Encounter: Payer: Self-pay | Admitting: Podiatrist

## 2018-10-05 DIAGNOSIS — A401 Sepsis due to streptococcus, group B: Secondary | ICD-10-CM

## 2018-10-05 HISTORY — DX: Sepsis due to Streptococcus, group B: A40.1

## 2018-10-05 NOTE — Progress Notes (Addendum)
  Chief Complaint  Patient presents with  . Wound Check    Right foot 1st toe wound. Wound has dark discoloration.      HPI: Patient is 53 y.o. male who presents today for a wound on the right hallux.  Patient states it was a callus he pulled free and this created a wound.  He was also recently released from the hospital for a septic arthritis of his sternoclavicular joint and is receiving Penicillin via picc line via home health ([facility]).  He denies any nausea, or vomiting.    Review of Systems  DATA OBTAINED: from patient  GENERAL: Feels well no fevers, no fatigue, no changes in appetite SKIN: No itching, no rashes, wound vacc at sternoclavicular joint left side EYES: No eye pain,no redness, no discharge EARS: No earache,no ringing of ears, NOSE: No congestion, no drainage, no bleeding  MOUTH/THROAT: No mouth pain, No sore throat, No difficulty chewing or swallowing  RESPIRATORY: No cough, no wheezing, no SOB CARDIAC: No chest pain,no heart palpitations, GI: No abdominal pain, No Nausea, no vomiting, no diarrhea, no heartburn or no reflux  GU: No dysuria, no increased frequency or urgency MUSCULOSKELETAL: No unrelieved bone/joint pain,  NEUROLOGIC: Awake, alert, appropriate to situation, No change in mental status. PSYCHIATRIC: No overt anxiety or sadness.No behavior issue.      Physical Exam  GENERAL APPEARANCE: Alert, conversant. Appropriately groomed. No acute distress.  VASCULAR: Pedal pulses palpable DP and PT bilateral.  Capillary refill time is immediate to all digits,  Proximal to distal cooling it warm to warm.  NEUROLOGIC: sensation is decreased epicritically and protectively to 5.07 monofilament at 2/5 sites bilateral.  Light touch is intact bilateral, vibratory sensation decreased bilateral, achilles tendon reflex is intact bilateral.  MUSCULOSKELETAL: acceptable muscle strength, tone and stability bilateral.  Intrinsic muscluature intact bilateral.  Range of motion  at ankle and first MPJ is normal bilateral.   DERMATOLOGIC: callus with underlying dried blood is present on the right hallux- once debrided there is a well circumscribed ulceration sub firs ip joint measuring 34mm in diameter and tunnels at 12 o'clock to 7mm in depth.  No probing to bone noted.  Ulcer is full thickness. No malodor, no pus, no drainage noted.  Red granular base is noted.  Left heel also has a superficial ulceration present as well.  It measures 73mm width x 21mm depth.  Equal depth with surrounding skin surface-  No drainage present   xrays taken show no boney destruction or sign of osteomyelitis on the right halllux-  A region of boney growth is present at the first metatarsal on the ap view .  No acute osseous abnormalities present. No gas in the soft tissues.   Assessment   Ulceration x 2  Plan  Careful debridement accomplished today.  Ulcers were debrided, cleansed and iodosorb and DSD applied.  Ordered iodosorb and wound change instructions through [facility].  I will see him back in 2 weeks for recheck and he is to call if any drainage, swelling, redness, pain or signs of foot infection arise.

## 2018-10-05 NOTE — Progress Notes (Deleted)
Subjective:    Patient ID: Randy Oneal, male    DOB: 1966/01/06, 53 y.o.   MRN: [id number]  HPI  53 y.o. male with poorly controlled diabetes mellitus hypertension and chronic kidney disease initially presented any [facility] with altered mental status and found to have group B strep bacteremia.  He did have pain in his chest and neck and was found to have gas and an abscess and likely septic arthritis of the sternoclavicular joint.  He was sent to come in for surgical further surgical evaluation.  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  He has had an MRI of the right foot that shows no evidence of osteomyelitis which is 1 of my anxieties.  We will just need to follow these clinically going forward.  Past Medical History:  Diagnosis Date  . Diabetes mellitus    Insulin dependent  . Hypertension   . Renal insufficiency   . Sepsis due to Streptococcus, group B ([place]) 10/05/2018    Past Surgical History:  Procedure Laterality Date  . APPLICATION OF WOUND VAC Left 08/27/2018   Procedure: APPLICATION OF WOUND VAC, left neck;  Surgeon: Gaye Pollack, MD;  Location: [place];  Service: Thoracic;  Laterality: Left;  . APPLICATION OF WOUND VAC Left 08/30/2018   Procedure: APPLICATION OF WOUND VAC;  Surgeon: Gaye Pollack, MD;  Location: [place];  Service: Thoracic;  Laterality: Left;  . COLONOSCOPY  06/24/2011   Procedure: COLONOSCOPY;  Surgeon: [name], MD;  Location: [facility];  Service: Endoscopy;  Laterality: N/A;  8:30 AM  . STERNAL WOUND DEBRIDEMENT Left 08/27/2018   Procedure: Incision and DEBRIDEMENT Left Chest, Neck and Mediastinum;  Surgeon: Gaye Pollack, MD;  Location: [facility] OR;  Service: Thoracic;  Laterality: Left;  . STERNAL WOUND  DEBRIDEMENT Left 08/30/2018   Procedure: WOUND VAC CHANGE, LEFT CHEST AND NECK, POSSIBLE DEBRIDEMENT;  Surgeon: Gaye Pollack, MD;  Location: [facility] OR;  Service: Thoracic;  Laterality: Left;    Family History  Problem Relation Age of Onset  . Hypertension Mother   . Colon cancer Neg Hx       Social History   Socioeconomic History  . Marital status: Married    Spouse name: Not on file  . Number of children: Not on file  . Years of education: Not on file  . Highest education level: Not on file  Occupational History  . Not on file  Social Needs  . Financial resource strain: Not on file  . Food insecurity:    Worry: Not on file    Inability: Not on file  . Transportation needs:    Medical: Not on file    Non-medical: Not on file  Tobacco Use  . Smoking status: Never Smoker  . Smokeless tobacco: Never Used  Substance and Sexual Activity  . Alcohol use: No  . Drug use: No  . Sexual activity: Not on file  Lifestyle  . Physical activity:    Days per week: Not on file    Minutes per session: Not on file  . Stress: Not on file  Relationships  . Social connections:    Talks on phone: Not on file    Gets together: Not on file  Attends religious service: Not on file    Active member of club or organization: Not on file    Attends meetings of clubs or organizations: Not on file    Relationship status: Not on file  Other Topics Concern  . Not on file  Social History Narrative  . Not on file    No Known Allergies   Current Outpatient Medications:  .  acetaminophen (TYLENOL) 325 MG tablet, Take 2 tablets (650 mg total) by mouth every 6 (six) hours as needed for mild pain (or Fever >/= 101). (Patient not taking: Reported on 09/28/2018), Disp: , Rfl:  .  amLODipine (NORVASC) 5 MG tablet, Take one tablet by mouth each day, Disp: 90 tablet, Rfl: 1 .  amoxicillin-clavulanate (AUGMENTIN) 875-125 MG tablet, Take 1 tablet by mouth 2 (two) times daily. To be taken IF you do not have  IV access to get PCN (Patient not taking: Reported on 09/28/2018), Disp: 60 tablet, Rfl: 1 .  atorvastatin (LIPITOR) 10 MG tablet, Take 1 tablet (10 mg total) by mouth daily., Disp: 30 tablet, Rfl: 06 .  Continuous Blood Gluc Sensor ([place]) MISC, Use one sensor every 10 days., Disp: 3 each, Rfl: 2 .  furosemide (LASIX) 40 MG tablet, Take 1 tablet (40 mg total) by mouth daily as needed. Take for weight gain of 3-5 lbs (Patient not taking: Reported on 09/28/2018), Disp: 30 tablet, Rfl: 3 .  glucose blood (BAYER CONTOUR NEXT TEST) test strip, USE TO TEST FOUR TIMES DAILY., Disp: 150 each, Rfl: 2 .  ibuprofen (ADVIL,MOTRIN) 200 MG tablet, Take 400 mg by mouth every 6 (six) hours as needed for mild pain or moderate pain., Disp: , Rfl:  .  Ibuprofen-diphenhydrAMINE Cit (ADVIL PM) 200-38 MG TABS, Take 2 tablets by mouth at bedtime as needed (for pain/sleep)., Disp: , Rfl:  .  insulin glargine (LANTUS) 100 UNIT/ML injection, Inject 0.3 mLs (30 Units total) into the skin at bedtime. (Patient not taking: Reported on 09/28/2018), Disp: 10 mL, Rfl: 1 .  insulin lispro (HUMALOG) 100 UNIT/ML cartridge, Inject 0.08 mLs (8 Units total) into the skin 3 (three) times daily with meals. (Patient not taking: Reported on 09/28/2018), Disp: 15 mL, Rfl: 1 .  Insulin Pen Needle (B-D ULTRAFINE [place] SHORT PEN) 31G X 8 MM MISC, 1 each by Does not apply route as directed., Disp: 150 each, Rfl: 3 .  metoprolol tartrate (LOPRESSOR) 50 MG tablet, Take one half tablet by mouth twice daily, Disp: 60 tablet, Rfl: 3 .  MICROLET LANCETS MISC, USE FOUR TIMES A DAY AS DIRECTED., Disp: 100 each, Rfl: 0 .  NOVOFINE 32G X 6 MM MISC, USE AS DIRECTED WITH INSULIN PEN FOUR TIMES DAILY., Disp: 100 each, Rfl: 0 .  nystatin (MYCOSTATIN/NYSTOP) powder, Apply topically 2 (two) times daily. To groin and scrotum, Disp: 15 g, Rfl: 0 .  oxyCODONE 10 MG TABS, Take 1 tablet (10 mg total) by mouth every 4 (four) hours as needed for  moderate pain or severe pain. (Patient not taking: Reported on 09/28/2018), Disp: 42 tablet, Rfl: 0 .  penicillin G IVPB, Inject 24 Million Units into the vein daily. Via continuous infusion. Indication: Bacteremia Last Day of Therapy:  10/12/2018 Labs - Once weekly:  CBC/D and BMP, Labs - Every other week:  ESR and CRP, Disp: 43 Units, Rfl: 0 .  penicillin G potassium 4 Million Units in dextrose 5 % 250 mL, Inject 4 Million Units into the vein every 4 (four) hours.,  Disp: 228 each, Rfl: 0 .  potassium chloride SA (K-DUR,KLOR-CON) 20 MEQ tablet, Take 1 tablet (20 mEq total) by mouth daily as needed. Only take on days you use Lasix (Patient not taking: Reported on 09/28/2018), Disp: 30 tablet, Rfl: 0 .  sildenafil (REVATIO) 20 MG tablet, May take up to 5 before relations as directed (Patient not taking: Reported on 09/28/2018), Disp: 50 tablet, Rfl: 4 .  TRESIBA FLEXTOUCH 100 UNIT/ML SOPN FlexTouch Pen, , Disp: , Rfl:    Review of Systems     Objective:   Physical Exam        Assessment & Plan:

## 2018-10-06 ENCOUNTER — Encounter: Payer: Self-pay | Admitting: Surgery

## 2018-10-06 ENCOUNTER — Ambulatory Visit: Payer: Managed Care, Other (non HMO) | Admitting: Surgery

## 2018-10-06 VITALS — BP 173/97 | HR 82 | Resp 16 | Ht 70.0 in | Wt 187.6 lb

## 2018-10-06 DIAGNOSIS — M009 Pyogenic arthritis, unspecified: Secondary | ICD-10-CM

## 2018-10-06 DIAGNOSIS — Z09 Encounter for follow-up examination after completed treatment for conditions other than malignant neoplasm: Secondary | ICD-10-CM | POA: Diagnosis not present

## 2018-10-06 DIAGNOSIS — Z4801 Encounter for change or removal of surgical wound dressing: Secondary | ICD-10-CM | POA: Diagnosis not present

## 2018-10-06 NOTE — Progress Notes (Signed)
HPI:  [name] returns today for wound VAC change and evaluation of his left sternoclavicular wound.  He has been doing well and has had no fevers or chills.  He continues on intravenous antibiotics at home and a home health nurse is changing the wound VAC 3 times per week.  Current Outpatient Medications  Medication Sig Dispense Refill  . amLODipine (NORVASC) 5 MG tablet Take one tablet by mouth each day 90 tablet 1  . atorvastatin (LIPITOR) 10 MG tablet Take 1 tablet (10 mg total) by mouth daily. 30 tablet 06  . Continuous Blood Gluc Sensor (FREESTYLE LIBRE SENSOR SYSTEM) MISC Use one sensor every 10 days. 3 each 2  . furosemide (LASIX) 40 MG tablet Take 1 tablet (40 mg total) by mouth daily as needed. Take for weight gain of 3-5 lbs 30 tablet 3  . glucose blood (BAYER CONTOUR NEXT TEST) test strip USE TO TEST FOUR TIMES DAILY. 150 each 2  . ibuprofen (ADVIL,MOTRIN) 200 MG tablet Take 400 mg by mouth every 6 (six) hours as needed for mild pain or moderate pain.    . Ibuprofen-diphenhydrAMINE Cit (ADVIL PM) 200-38 MG TABS Take 2 tablets by mouth at bedtime as needed (for pain/sleep).    . insulin glargine (LANTUS) 100 UNIT/ML injection Inject 0.3 mLs (30 Units total) into the skin at bedtime. 10 mL 1  . insulin lispro (HUMALOG) 100 UNIT/ML cartridge Inject 0.08 mLs (8 Units total) into the skin 3 (three) times daily with meals. 15 mL 1  . Insulin Pen Needle (B-D ULTRAFINE III SHORT PEN) 31G X 8 MM MISC 1 each by Does not apply route as directed. 150 each 3  . metoprolol tartrate (LOPRESSOR) 50 MG tablet Take one half tablet by mouth twice daily 60 tablet 3  . MICROLET LANCETS MISC USE FOUR TIMES A DAY AS DIRECTED. 100 each 0  . NOVOFINE 32G X 6 MM MISC USE AS DIRECTED WITH INSULIN PEN FOUR TIMES DAILY. 100 each 0  . nystatin (MYCOSTATIN/NYSTOP) powder Apply topically 2 (two) times daily. To groin and scrotum 15 g 0  . penicillin G IVPB Inject 24 Million Units into the vein daily. Via  continuous infusion. Indication: Bacteremia Last Day of Therapy:  10/12/2018 Labs - Once weekly:  CBC/D and BMP, Labs - Every other week:  ESR and CRP 43 Units 0  . penicillin G potassium 4 Million Units in dextrose 5 % 250 mL Inject 4 Million Units into the vein every 4 (four) hours. 228 each 0  . sildenafil (REVATIO) 20 MG tablet May take up to 5 before relations as directed 50 tablet 4  . TRESIBA FLEXTOUCH 100 UNIT/ML SOPN FlexTouch Pen     . amoxicillin-clavulanate (AUGMENTIN) 875-125 MG tablet Take 1 tablet by mouth 2 (two) times daily. To be taken IF you do not have IV access to get PCN (Patient not taking: Reported on 09/28/2018) 60 tablet 1  . oxyCODONE 10 MG TABS Take 1 tablet (10 mg total) by mouth every 4 (four) hours as needed for moderate pain or severe pain. (Patient not taking: Reported on 09/28/2018) 42 tablet 0  . potassium chloride SA (K-DUR,KLOR-CON) 20 MEQ tablet Take 1 tablet (20 mEq total) by mouth daily as needed. Only take on days you use Lasix (Patient not taking: Reported on 09/28/2018) 30 tablet 0   No current facility-administered medications for this visit.      Physical Exam: BP (!) 173/97 (BP Location: Left Arm, Patient Position: Sitting,  Cuff Size: Large)   Pulse 82   Resp 16   Ht '5\' 10"'$  (1.778 m)   Wt 187 lb 9.6 oz (85.1 kg)   SpO2 98% Comment: ON RA  BMI 26.92 kg/m  He looks well. The left sternoclavicular wound is clean and granulating.  It does extend down into the sternoclavicular joint but the surfaces of the joint appeared to be granulating.  There is minimal fibrinous debris that was removed with a pair of scissors.  There is no erythema or tenderness of the surrounding skin.  A small white wound VAC sponge was inserted into the depth of the wound to cover all of the granulating surface.  The airtight dressing was applied and this was connected to suction.  The patient tolerated the procedure well.  Diagnostic Tests:  None today  Impression:  The  wound is slowly healing in with wound VAC dressing changes.  He will continue his antibiotic course per infectious disease.  We will continue wound VAC changes Monday Wednesday and Friday by the home health nurse.  Plan:  I will plan to see him back in 2 weeks for reevaluation of the wound and to change the wound VAC.  I spent 10 minutes performing this established patient evaluation and > 50% of this time was spent face to face counseling and coordinating the care of this patient's left sternoclavicular joint abscess and subsequent wound.    Gaye Pollack, MD [facility] [phone]

## 2018-10-08 ENCOUNTER — Telehealth: Payer: Self-pay | Admitting: Family Medicine

## 2018-10-08 NOTE — Telephone Encounter (Signed)
Pt said Dr. Nicki Reaper wants pt to do some strengthening & he has a nephew that is a Physiological scientist.  Pt's nephew would like Dr. Nicki Reaper to send a letter with ideas on patient's strengthening needs so he can come up with a plan to help patient.    Please advise & call pt

## 2018-10-08 NOTE — Telephone Encounter (Signed)
Please advise. Thank you

## 2018-10-13 ENCOUNTER — Telehealth: Payer: Self-pay | Admitting: *Deleted

## 2018-10-13 ENCOUNTER — Ambulatory Visit (INDEPENDENT_AMBULATORY_CARE_PROVIDER_SITE_OTHER): Payer: Managed Care, Other (non HMO) | Admitting: Podiatrist

## 2018-10-13 ENCOUNTER — Encounter: Payer: Self-pay | Admitting: Podiatrist

## 2018-10-13 DIAGNOSIS — L97512 Non-pressure chronic ulcer of other part of right foot with fat layer exposed: Secondary | ICD-10-CM

## 2018-10-13 DIAGNOSIS — D649 Anemia, unspecified: Secondary | ICD-10-CM

## 2018-10-13 NOTE — Telephone Encounter (Signed)
Left message to return call 

## 2018-10-13 NOTE — Patient Instructions (Signed)
Your foot ulcer is healed!!  No need for further wound care- you may return to your regular shoes   Start applying vaseline or lotion to the bottoms of your feet daily.  Call me if you notice any blood on your socks or any wounds open up.

## 2018-10-13 NOTE — Progress Notes (Signed)
   Chief Complaint  Patient presents with  . Wound Check    Right 1st toe medial side wound check. Pt states healing well no concerns. Left foot plantar wound check, pt states healing well, no concerns. Pt denies fever/nausea/vomiting/chills.    Patient presents today for follow up of right hallux ulcer and left central foot ulcer.  He states he is doing well is been wearing his postoperative shoe as instructed.  Home health has been applying wound care dressing changes to the feet and overall he is doing well..  Objective:  Neurovascular status intact and unchanged from previous visit.  Healed ulcer right hallux is noted.  Some callus tissues periwound which is debrided and reveals intact skin beneath.  Wound central aspect of the left heel is also healed.  Assessment/Plan:  Heel ulcer right hallux and left central foot  Plan: Ulcer is healed will DC wound care orders.  Recommend preventative care of his feet for calluses and nail trims every 2 to 3 months.  We will set him up for routine maintenance.  He will call if any concerns arise or if he notices any bleeding or drainage from his feet.

## 2018-10-13 NOTE — Telephone Encounter (Signed)
[facility] called to report low hemoglobin

## 2018-10-13 NOTE — Telephone Encounter (Signed)
I did give instructions to the nurse to do CBC, ferritin, TIBC next Monday  Nurses- please do the following Protonix 40 mg 1 daily, #30, 4 refills Notify patient of this medication Also have patient do stool test FIT for blood- patient should try to get this completed over the next several days and bring it back to us Family may pick up this kit Educate the patient that if he starts seeing black or bloody stools to be evaluated right away here or ER Also make sure patient stays away from all anti-inflammatories  If patient feels he is having any significant problems severe abdominal pain or other problems to be seen 

## 2018-10-14 ENCOUNTER — Telehealth: Payer: Self-pay | Admitting: *Deleted

## 2018-10-14 ENCOUNTER — Encounter: Payer: Self-pay | Admitting: *Deleted

## 2018-10-14 MED ORDER — PANTOPRAZOLE SODIUM 40 MG PO TBEC: 40.0000 mg | DELAYED_RELEASE_TABLET | Freq: Every day | ORAL | 4 refills | Status: DC

## 2018-10-14 NOTE — Telephone Encounter (Signed)
Wife (DPR) notified and verbalized understanding and will pick up stool kit

## 2018-10-14 NOTE — Telephone Encounter (Signed)
-----   Message from Randy Oneal, DPM sent at 10/13/2018  1:53 PM EST ----- Regarding: discontinue wound care orders Val-  Could you [facility] a [facility] to [facility] to discontinue dressing change orders on this patient-  his ulcer is healed!!  Thanks!! ke

## 2018-10-14 NOTE — Telephone Encounter (Signed)
Letter faxed to [facility] discontinuing home care dressing changes ulcer is healed.

## 2018-10-17 NOTE — Telephone Encounter (Signed)
Until the thoracic surgeon releases him from his wound VAC-it will be necessary for the thoracic surgeon to give him better details  The patient may do mild gentle stretch bands and dumbbells  5 pounds or under  Mild walking permissible as strength and balance permits  If patient would like this written down please do so

## 2018-10-18 NOTE — Telephone Encounter (Signed)
Spoke with [name] pt wife. Directions written down and mailed to patient. Pt wife verbalized understanding.

## 2018-10-20 ENCOUNTER — Ambulatory Visit: Payer: Managed Care, Other (non HMO) | Admitting: Internal Medicine

## 2018-10-20 ENCOUNTER — Other Ambulatory Visit: Payer: Self-pay

## 2018-10-20 ENCOUNTER — Ambulatory Visit: Payer: Managed Care, Other (non HMO) | Admitting: Surgery

## 2018-10-20 ENCOUNTER — Encounter: Payer: Self-pay | Admitting: Internal Medicine

## 2018-10-20 ENCOUNTER — Encounter: Payer: Self-pay | Admitting: Surgery

## 2018-10-20 VITALS — BP 144/82 | HR 83 | Ht 70.0 in | Wt 191.4 lb

## 2018-10-20 VITALS — BP 156/86 | HR 79 | Resp 16 | Ht 70.0 in | Wt 187.0 lb

## 2018-10-20 DIAGNOSIS — Z794 Long term (current) use of insulin: Secondary | ICD-10-CM | POA: Diagnosis not present

## 2018-10-20 DIAGNOSIS — M009 Pyogenic arthritis, unspecified: Secondary | ICD-10-CM

## 2018-10-20 DIAGNOSIS — E1165 Type 2 diabetes mellitus with hyperglycemia: Secondary | ICD-10-CM

## 2018-10-20 DIAGNOSIS — Z09 Encounter for follow-up examination after completed treatment for conditions other than malignant neoplasm: Secondary | ICD-10-CM | POA: Diagnosis not present

## 2018-10-20 DIAGNOSIS — Z4801 Encounter for change or removal of surgical wound dressing: Secondary | ICD-10-CM

## 2018-10-20 LAB — BASIC METABOLIC PANEL
BUN: 27 mg/dL — ABNORMAL HIGH (ref 6–23)
CO2: 28 mEq/L (ref 19–32)
Calcium: 8.3 mg/dL — ABNORMAL LOW (ref 8.4–10.5)
Chloride: 105 mEq/L (ref 96–112)
Creatinine, Ser: 1.63 mg/dL — ABNORMAL HIGH (ref 0.40–1.50)
GFR: 53.9 mL/min — ABNORMAL LOW (ref >60.00)
Glucose, Bld: 148 mg/dL — ABNORMAL HIGH (ref 70–99)
Potassium: 3.8 mEq/L (ref 3.5–5.1)
SODIUM: 137 meq/L (ref 135–145)

## 2018-10-20 MED ORDER — INSULIN LISPRO (1 UNIT DIAL) 100 UNIT/ML (KWIKPEN): 7.0000 [IU] | PEN_INJECTOR | Freq: Three times a day (TID) | SUBCUTANEOUS | 11 refills | Status: DC

## 2018-10-20 MED ORDER — TRESIBA FLEXTOUCH 100 UNIT/ML ~~LOC~~ SOPN: 24.0000 [IU] | PEN_INJECTOR | Freq: Every day | SUBCUTANEOUS | 1 refills | Status: DC

## 2018-10-20 MED ORDER — INSULIN PEN NEEDLE 32G X 4 MM MISC: 11 refills | Status: DC

## 2018-10-20 NOTE — Progress Notes (Signed)
Name: Randy Oneal  MRN/ DOB: [id number], March 21, 1966   Age/ Sex: 53 y.o., male    PCP: [name], MD   Reason for Endocrinology Evaluation: Type 2 Diabetes Mellitus     Date of Initial Endocrinology Visit: 10/21/2018     PATIENT IDENTIFIER: Randy Oneal is a 53 y.o. male with a past medical history of  T2DM,and HTN . The patient presented for initial endocrinology clinic visit on 10/21/2018 for consultative assistance with his diabetes management.    HPI: Randy Oneal is accompanied by his daughter Janett Billow    Diagnosed with T2DM in 2000 . Prior Medications tried/Intolerance: Metformin,  Currently checking blood sugars 2x / day,  before meal.  Hypoglycemia episodes : Yes          Symptoms:jittery                   Frequency: 3/ week Hemoglobin A1c has ranged from 13.0% in 08/2018, peaking at 16.8% in 2017 Patient required assistance for hypoglycemia: no Patient has required hospitalization within the last 1 year from hyper or hypoglycemia:  08/2018   In terms of diet, the patient stopped sugar-sweetened beverages, eats 3 meals a day, snacks 1 -2 days ago.    HOME DIABETES REGIMEN: Tresiba 30 units QHS Humalog 8 units TID QAC   Statin: yes ACE-I/ARB: Yes Prior Diabetic Education: no   METER DOWNLOAD SUMMARY: Date range evaluated:2/11-3/06/2019 Fingerstick Blood Glucose Tests = 32 Overall Mean FS Glucose = 132.9 Standard Deviation = 64.6  BG Ranges: Low = 55 High =347   Hypoglycemic Events/30 Days: BG < 50 = 0 Episodes of symptomatic severe hypoglycemia = 0   DIABETIC COMPLICATIONS: Microvascular complications:    CKD  Denies: retinopathy, neuropathy  Last eye exam: Completed a while ago  Macrovascular complications:    Denies: CAD, PVD, CVA   PAST HISTORY: Past Medical History:  Past Medical History:  Diagnosis Date  . Diabetes mellitus    Insulin dependent  . Hypertension   . Renal insufficiency   . Sepsis due to Streptococcus,  group B ([place]) 10/05/2018   Past Surgical History:  Past Surgical History:  Procedure Laterality Date  . APPLICATION OF WOUND VAC Left 08/27/2018   Procedure: APPLICATION OF WOUND VAC, left neck;  Surgeon: Gaye Pollack, MD;  Location: [facility];  Service: Thoracic;  Laterality: Left;  . APPLICATION OF WOUND VAC Left 08/30/2018   Procedure: APPLICATION OF WOUND VAC;  Surgeon: Gaye Pollack, MD;  Location: [facility];  Service: Thoracic;  Laterality: Left;  . COLONOSCOPY  06/24/2011   Procedure: COLONOSCOPY;  Surgeon: [name], MD;  [facility];  Service: Endoscopy;  Laterality: N/A;  8:30 AM  . STERNAL WOUND DEBRIDEMENT Left 08/27/2018   Procedure: Incision and DEBRIDEMENT Left Chest, Neck and Mediastinum;  Surgeon: Gaye Pollack, MD;  Location: [facility] OR;  Service: Thoracic;  Laterality: Left;  . STERNAL WOUND DEBRIDEMENT Left 08/30/2018   Procedure: WOUND VAC CHANGE, LEFT CHEST AND NECK, POSSIBLE DEBRIDEMENT;  Surgeon: Gaye Pollack, MD;  Location: [facility] OR;  Service: Thoracic;  Laterality: Left;      Social History:  reports that he has never smoked. He has never used smokeless tobacco. He reports that he does not drink alcohol or use drugs. Family History:  Family History  Problem Relation Age of Onset  . Hypertension Mother   . Colon cancer Neg Hx      HOME MEDICATIONS: Allergies as  of 10/20/2018   No Known Allergies     Medication List       Accurate as of October 20, 2018 11:59 PM. Always use your most recent med list.        Advil PM 200-38 MG Tabs Generic drug:  Ibuprofen-diphenhydrAMINE Cit Take 2 tablets by mouth at bedtime as needed (for pain/sleep).   amLODipine 5 MG tablet Commonly known as:  NORVASC Take one tablet by mouth each day   amoxicillin-clavulanate 875-125 MG tablet Commonly known as:  Augmentin Take 1 tablet by mouth 2 (two) times daily. To be taken IF you do not have IV access to get PCN   atorvastatin 10 MG tablet Commonly known as:   LIPITOR Take 1 tablet (10 mg total) by mouth daily.   FreeStyle [employer] Use one sensor every 10 days.   furosemide 40 MG tablet Commonly known as:  LASIX Take 1 tablet (40 mg total) by mouth daily as needed. Take for weight gain of 3-5 lbs   glucose blood test strip Commonly known as:  Visual merchandiser Next Test USE TO TEST FOUR TIMES DAILY.   ibuprofen 200 MG tablet Commonly known as:  ADVIL,MOTRIN Take 400 mg by mouth every 6 (six) hours as needed for mild pain or moderate pain.   insulin lispro 100 UNIT/ML KwikPen Commonly known as:  HumaLOG KwikPen Inject 0.07 mLs (7 Units total) into the skin 3 (three) times daily.   Insulin Pen Needle 32G X 4 MM Misc Commonly known as:  BD Pen Needle Nano 2nd Gen Four times daily   lisinopril 10 MG tablet Commonly known as:  PRINIVIL,ZESTRIL   metoprolol tartrate 50 MG tablet Commonly known as:  LOPRESSOR Take one half tablet by mouth twice daily   Microlet Lancets Misc USE FOUR TIMES A DAY AS DIRECTED.   nystatin powder Commonly known as:  MYCOSTATIN/NYSTOP Apply topically 2 (two) times daily. To groin and scrotum   Oxycodone HCl 10 MG Tabs Take 1 tablet (10 mg total) by mouth every 4 (four) hours as needed for moderate pain or severe pain.   pantoprazole 40 MG tablet Commonly known as:  Protonix Take 1 tablet (40 mg total) by mouth daily.   potassium chloride SA 20 MEQ tablet Commonly known as:  K-DUR,KLOR-CON Take 1 tablet (20 mEq total) by mouth daily as needed. Only take on days you use Lasix   sildenafil 20 MG tablet Commonly known as:  REVATIO May take up to 5 before relations as directed   Tresiba FlexTouch 100 UNIT/ML Sopn FlexTouch Pen Generic drug:  insulin degludec Inject 0.24 mLs (24 Units total) into the skin at bedtime.        ALLERGIES: No Known Allergies   REVIEW OF SYSTEMS: A comprehensive ROS was conducted with the patient and is negative except as per HPI and below:  Review  of Systems  Constitutional: Negative for fever.  HENT: Negative for congestion and sore throat.   Eyes: Negative for blurred vision and pain.  Respiratory: Negative for cough and shortness of breath.   Cardiovascular: Negative for chest pain and palpitations.  Gastrointestinal: Negative for diarrhea and nausea.  Genitourinary: Positive for frequency.  Skin: Negative.   Neurological: Positive for tingling. Negative for tremors.  Endo/Heme/Allergies: Negative for polydipsia.  Psychiatric/Behavioral: Negative for depression. The patient is not nervous/anxious.       OBJECTIVE:   VITAL SIGNS: BP (!) 144/82 (BP Location: Left Arm, Patient Position: Sitting, Cuff Size: Normal)  Pulse 83   Ht 5\' 10"  (1.778 m)   Wt 191 lb 6.4 oz (86.8 kg)   SpO2 98%   BMI 27.46 kg/m    PHYSICAL EXAM:  General: Pt appears well and is in NAD  Hydration: Well-hydrated with moist mucous membranes and good skin turgor  HEENT: Head: Unremarkable with good dentition. Oropharynx clear without exudate.  Eyes: External eye exam normal without stare, lid lag or exophthalmos.  EOM intact.  PERRL.  Neck: General: Supple without adenopathy or carotid bruits. Thyroid: Thyroid size normal.  No goiter or nodules appreciated. No thyroid bruit.  Lungs: Clear with good BS bilat with no rales, rhonchi, or wheezes  Heart: RRR with normal S1 and S2 and no gallops; no murmurs; no rub  Abdomen: Normoactive bowel sounds, soft, nontender, without masses or organomegaly palpable  Extremities:  Lower extremities - No pretibial edema. No lesions.  Skin: Normal texture and temperature to palpation. No rash noted. No Acanthosis nigricans/skin tags. No lipohypertrophy.  Neuro: MS is good with appropriate affect, pt is alert and Ox3    DM foot exam: 10/20/2018 The skin of the feet is intact without sores or ulcerations. Pt with plantar callous formation bilaterally  The pedal pulses are 2+ on right and 2+ on left. The sensation is  absent on the right, intact on the right  to a screening 5.07, 10 gram monofilament.   DATA REVIEWED:  Lab Results  Component Value Date   HGBA1C 13.0 (A) 08/16/2018   HGBA1C >15.5 (H) 02/04/2018   HGBA1C >15.5 (H) 07/09/2017    Lab Results  Component Value Date   CHOL 183 02/04/2018   HDL 65 02/04/2018   LDLCALC 106 (H) 02/04/2018   TRIG 62 02/04/2018   CHOLHDL 2.8 02/04/2018       Results for TRASE, BUNDA Oneal (MRN [id number]) as of 10/21/2018 13:20  Ref. Range 10/20/2018 14:06  Sodium Latest Ref Range: 135 - 145 mEq/L 137  Potassium Latest Ref Range: 3.5 - 5.1 mEq/L 3.8  Chloride Latest Ref Range: 96 - 112 mEq/L 105  CO2 Latest Ref Range: 19 - 32 mEq/L 28  Glucose Latest Ref Range: 70 - 99 mg/dL 148 (H)  BUN Latest Ref Range: 6 - 23 mg/dL 27 (H)  Creatinine Latest Ref Range: 0.40 - 1.50 mg/dL 1.63 (H)  Calcium Latest Ref Range: 8.4 - 10.5 mg/dL 8.3 (L)  GFR Latest Ref Range: >60.00 mL/min 53.90 (L)   ASSESSMENT / PLAN / RECOMMENDATIONS:   1) Type 2 Diabetes Mellitus, Poorly controlled, With CKD Oneal and neuropathic complications - Most recent A1c of 13.0 %. Goal A1c < 7.0 %.    Plan: GENERAL: I have discussed with the patient the pathophysiology of diabetes. We went over the natural progression of the disease. We talked about both insulin resistance and insulin deficiency. We stressed the importance of lifestyle changes including diet and exercise. I explained the complications associated with diabetes including retinopathy, nephropathy, neuropathy as well as increased risk of cardiovascular disease. We went over the benefit seen with glycemic control.   I explained to the patient that diabetic patients are at higher than normal risk for amputations.   His historically poorly controlled diabetes is due to medication nonadherence and dietary indiscretions.  Since his hospitalization, he has been very motivated to keep his diabetes under optimal control. Discussed  pharmacokinetics of basal/bolus insulin and the importance of taking prandial insulin with meals.   We also discussed avoiding sugar-sweetened beverages and snacks, when possible.  We have discussed the possibility of adding other insulin sensitizers in the future.  In review of his glucose meter download today, patient is noted to have postprandial hypoglycemia, this is due to the presence of high dose of basal insulin, will adjust his insulin regimen as below.   MEDICATIONS:  - Decrease Tresiba to 24 units at Bedtime - Decrease Humalog to 7 units with each meal    EDUCATION / INSTRUCTIONS:  BG monitoring instructions: Patient is instructed to check his blood sugars 4 times a day, before meals and bedtime.  Call [facility] if: BG persistently < 70 or > 300. . I reviewed the Rule of 15 for the treatment of hypoglycemia in detail with the patient. Literature supplied.   2) Diabetic complications:   Eye: Does not have known diabetic retinopathy.   Neuro/ Feet: Does  have known diabetic peripheral neuropathy.  Renal: Patient does  have known baseline CKD. He is  on an ACEI/ARB at present.   3) Lipids: Patient is  on a statin.    4) Hypertension: He is above goal of < 140/90 mmHg.       Signed electronically by: Mack Guise, MD  [facility] Endocrinology  [facility] [address] [place], [place] [address] Phone: [phone] FAX: [phone]   CC: [name], [place] [address] Phone: [phone]  Fax: [phone]    Return to Endocrinology clinic as below: Future Appointments  Date Time Provider [place]  11/03/2018  9:00 AM Gaye Pollack, MD [facility]  11/05/2018  9:15 AM Tommy Medal, Lavell Islam, MD [facility]  11/10/2018 10:30 AM [name], MD [facility]  11/17/2018  1:20 PM , Melanie Crazier, MD [facility] None  12/15/2018 10:15 AM  Bronson Ing, DPM [facility]

## 2018-10-20 NOTE — Progress Notes (Signed)
HPI:   Mr. Nakamura returns today for follow-up of his left sternoclavicular joint infection.  He has now completed his full course of intravenous antibiotics.  He was scheduled to return to see Dr. Tommy Medal but missed his appointment.  Home health nursing is still doing back wound changes 3 times per week.  He denies any fever or chills.  He has no significant discomfort from the wound.  Current Outpatient Medications  Medication Sig Dispense Refill  . amLODipine (NORVASC) 5 MG tablet Take one tablet by mouth each day 90 tablet 1  . atorvastatin (LIPITOR) 10 MG tablet Take 1 tablet (10 mg total) by mouth daily. 30 tablet 06  . Continuous Blood Gluc Sensor (FREESTYLE LIBRE SENSOR SYSTEM) MISC Use one sensor every 10 days. 3 each 2  . furosemide (LASIX) 40 MG tablet Take 1 tablet (40 mg total) by mouth daily as needed. Take for weight gain of 3-5 lbs 30 tablet 3  . glucose blood (BAYER CONTOUR NEXT TEST) test strip USE TO TEST FOUR TIMES DAILY. 150 each 2  . ibuprofen (ADVIL,MOTRIN) 200 MG tablet Take 400 mg by mouth every 6 (six) hours as needed for mild pain or moderate pain.    . Ibuprofen-diphenhydrAMINE Cit (ADVIL PM) 200-38 MG TABS Take 2 tablets by mouth at bedtime as needed (for pain/sleep).    . insulin glargine (LANTUS) 100 UNIT/ML injection Inject 0.3 mLs (30 Units total) into the skin at bedtime. 10 mL 1  . insulin lispro (HUMALOG) 100 UNIT/ML cartridge Inject 0.08 mLs (8 Units total) into the skin 3 (three) times daily with meals. 15 mL 1  . Insulin Pen Needle (B-D ULTRAFINE III SHORT PEN) 31G X 8 MM MISC 1 each by Does not apply route as directed. 150 each 3  . lisinopril (PRINIVIL,ZESTRIL) 10 MG tablet     . metoprolol tartrate (LOPRESSOR) 50 MG tablet Take one half tablet by mouth twice daily 60 tablet 3  . MICROLET LANCETS MISC USE FOUR TIMES A DAY AS DIRECTED. 100 each 0  . NOVOFINE 32G X 6 MM MISC USE AS DIRECTED WITH INSULIN PEN FOUR TIMES DAILY. 100 each 0  . nystatin  (MYCOSTATIN/NYSTOP) powder Apply topically 2 (two) times daily. To groin and scrotum 15 g 0  . pantoprazole (PROTONIX) 40 MG tablet Take 1 tablet (40 mg total) by mouth daily. 30 tablet 4  . potassium chloride SA (K-DUR,KLOR-CON) 20 MEQ tablet Take 1 tablet (20 mEq total) by mouth daily as needed. Only take on days you use Lasix 30 tablet 0  . sildenafil (REVATIO) 20 MG tablet May take up to 5 before relations as directed 50 tablet 4  . TRESIBA FLEXTOUCH 100 UNIT/ML SOPN FlexTouch Pen     . amoxicillin-clavulanate (AUGMENTIN) 875-125 MG tablet Take 1 tablet by mouth 2 (two) times daily. To be taken IF you do not have IV access to get PCN (Patient not taking: Reported on 10/20/2018) 60 tablet 1  . oxyCODONE 10 MG TABS Take 1 tablet (10 mg total) by mouth every 4 (four) hours as needed for moderate pain or severe pain. (Patient not taking: Reported on 10/20/2018) 42 tablet 0   No current facility-administered medications for this visit.      Physical Exam: BP (!) 156/86 (BP Location: Left Arm, Patient Position: Sitting, Cuff Size: Large)   Pulse 79   Resp 16   Ht 5\' 10"  (1.778 m)   Wt 187 lb (84.8 kg)   SpO2 98%  Comment: ON RA  BMI 26.83 kg/m  He looks well. The wound is clean and granulating and very superficial.  There is no drainage.  I do not think it requires a VAC any longer.  There is no drainage.  I cannot push a cotton swab down into the joint area.  There is still some induration and swelling around the region but it is nontender and there is no erythema.  I suspect this is probably residual swelling due to the infection and wound and will gradually improve.  Diagnostic Tests:  None today  Impression:  He has now completed his full course of intravenous antibiotics for sternoclavicular joint abscess and extensive infection over the left upper chest wall and base of the left neck.  The wound is very superficial now and I think we will switch to wet-to-dry dressings with normal  saline.  This should probably be changed daily by home health nursing.  We will remove his PICC line today.  He needs to follow-up with infectious disease.  I told him that I will follow him over the next couple months to be sure that this completely resolves.  There is still a chance that he may develop recurrent infection in this area and explained to him that if he develops any increased swelling, erythema, or drainage from the wound that I need to see him immediately.  Plan:  We will reschedule appointment with infectious disease.  I will plan to see him back in 2 weeks for follow-up.   Gaye Pollack, MD [facility] [phone]

## 2018-10-20 NOTE — Patient Instructions (Addendum)
-   Decrease Tresiba to 24 units at Bedtime - Decrease Humalog to 7 units with each meal  - Check sugars before meals and bedtime   Choose healthy, lower carb lower calorie snacks: toss salad, cooked vegetables, cottage cheese, peanut butter, low fat cheese / string cheese, lower sodium deli meat, tuna salad or chicken salad     HOW TO TREAT LOW BLOOD SUGARS (Blood sugar LESS THAN 70 MG/DL)  Please follow the RULE OF 15 for the treatment of hypoglycemia treatment (when your (blood sugars are less than 70 mg/dL)    STEP 1: Take 15 grams of carbohydrates when your blood sugar is low, which includes:   3-4 GLUCOSE TABS  OR  3-4 OZ OF JUICE OR REGULAR SODA OR  ONE TUBE OF GLUCOSE GEL     STEP 2: RECHECK blood sugar in 15 MINUTES STEP 3: If your blood sugar is still low at the 15 minute recheck --> then, go back to STEP 1 and treat AGAIN with another 15 grams of carbohydrates. Decrease Tresiba to 24 units at bedtime Decrease Humalog to 7 units 3 times daily with meals,

## 2018-10-21 ENCOUNTER — Encounter: Payer: Self-pay | Admitting: Internal Medicine

## 2018-10-22 ENCOUNTER — Telehealth: Payer: Self-pay

## 2018-10-22 NOTE — Telephone Encounter (Signed)
Malachy Mood with [facility] [phone] contacted the office to state that Mr. Cada has been discharged from home health services as he no longer has the wound vac in place.  Patient and his wife was instructed per Malachy Mood on how to do daily NS wet-to-dry dressings.  Will await faxed copy for physician signature.

## 2018-11-02 ENCOUNTER — Other Ambulatory Visit: Payer: Self-pay

## 2018-11-02 ENCOUNTER — Inpatient Hospital Stay: Payer: Managed Care, Other (non HMO) | Admitting: Infectious Disease

## 2018-11-03 ENCOUNTER — Other Ambulatory Visit: Payer: Self-pay | Admitting: *Deleted

## 2018-11-03 ENCOUNTER — Encounter: Payer: Self-pay | Admitting: Surgery

## 2018-11-03 ENCOUNTER — Ambulatory Visit: Payer: Managed Care, Other (non HMO) | Admitting: Surgery

## 2018-11-03 VITALS — BP 140/84 | HR 80 | Temp 97.4°F | Ht 70.0 in | Wt 202.0 lb

## 2018-11-03 DIAGNOSIS — Z09 Encounter for follow-up examination after completed treatment for conditions other than malignant neoplasm: Secondary | ICD-10-CM | POA: Diagnosis not present

## 2018-11-03 DIAGNOSIS — M009 Pyogenic arthritis, unspecified: Secondary | ICD-10-CM

## 2018-11-03 DIAGNOSIS — Z4801 Encounter for change or removal of surgical wound dressing: Secondary | ICD-10-CM | POA: Diagnosis not present

## 2018-11-03 NOTE — Progress Notes (Signed)
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  He underwent surgical drainage on 08/30/2018 followed by a complete course of intravenous antibiotics per infectious disease and treatment with VAC wound dressings until the past couple weeks.  The wound closing nicely and his wife has been doing wet-to-dry dressings now once daily.  Home health nursing is no longer coming.  He denies any fever or chills.  He said that there is usually minimal drainage on the dressing when it is changed.  He denies any pain in his chest or neck.  Current Outpatient Medications  Medication Sig Dispense Refill  . amLODipine (NORVASC) 5 MG tablet Take one tablet by mouth each day 90 tablet 1  . atorvastatin (LIPITOR) 10 MG tablet Take 1 tablet (10 mg total) by mouth daily. 30 tablet 06  . Continuous Blood Gluc Sensor (FREESTYLE LIBRE SENSOR SYSTEM) MISC Use one sensor every 10 days. 3 each 2  . furosemide (LASIX) 40 MG tablet Take 1 tablet (40 mg total) by mouth daily as needed. Take for weight gain of 3-5 lbs 30 tablet 3  . glucose blood (BAYER CONTOUR NEXT TEST) test strip USE TO TEST FOUR TIMES DAILY. 150 each 2  . ibuprofen (ADVIL,MOTRIN) 200 MG tablet Take 400 mg by mouth every 6 (six) hours as needed for mild pain or moderate pain.    . Ibuprofen-diphenhydrAMINE Cit (ADVIL PM) 200-38 MG TABS Take 2 tablets by mouth at bedtime as needed (for pain/sleep).    . insulin lispro (HUMALOG KWIKPEN) 100 UNIT/ML KwikPen Inject 0.07 mLs (7 Units total) into the skin 3 (three) times daily. 15 mL 11  . Insulin Pen Needle (BD PEN NEEDLE NANO 2ND GEN) 32G X 4 MM MISC Four times daily 150 each 11  . lisinopril (PRINIVIL,ZESTRIL) 10 MG tablet     . metoprolol tartrate (LOPRESSOR) 50 MG tablet Take one half tablet by mouth twice daily 60 tablet 3  . MICROLET LANCETS MISC USE FOUR TIMES A DAY AS DIRECTED. 100  each 0  . nystatin (MYCOSTATIN/NYSTOP) powder Apply topically 2 (two) times daily. To groin and scrotum 15 g 0  . oxyCODONE 10 MG TABS Take 1 tablet (10 mg total) by mouth every 4 (four) hours as needed for moderate pain or severe pain. 42 tablet 0  . pantoprazole (PROTONIX) 40 MG tablet Take 1 tablet (40 mg total) by mouth daily. 30 tablet 4  . potassium chloride SA (K-DUR,KLOR-CON) 20 MEQ tablet Take 1 tablet (20 mEq total) by mouth daily as needed. Only take on days you use Lasix 30 tablet 0  . sildenafil (REVATIO) 20 MG tablet May take up to 5 before relations as directed 50 tablet 4  . TRESIBA FLEXTOUCH 100 UNIT/ML SOPN FlexTouch Pen Inject 0.24 mLs (24 Units total) into the skin at bedtime. 15 mL 1   No current facility-administered medications for this visit.      Physical Exam: BP 140/84   Pulse 80   Temp (!) 97.4 F (36.3 C) (Oral)   Ht 5\' 10"  (1.778 m)   Wt 202 lb (91.6 kg)   SpO2 100% Comment: RA  BMI 28.98 kg/m  He looks well. The wound is essentially completely healed.  There is minimal granulation tissue in the center which I cauterized with silver nitrate stick.  There is no drainage.  There is still significant indurated swelling over the  medial left clavicle but this is not changed over the past several weeks.  There is very mild erythema of the skin inferior to the wound which looks like it could be related to the tape.  There is no tenderness or fluctuation.  Diagnostic Tests:  None today  Impression:  He is about 2 months following surgical drainage of a left sternoclavicular joint infection.  The wound is essentially healed but there is still significant swelling and induration in the area which is nontender.  Is not clear if this is just residual from the surgery or whether he still has some underlying infection.  He is not showing any systemic signs of infection and is completely nontender.  There is no fluctuance to suggest an abscess.  I asked him to keep the  wound covered with a dry dressing and change it once daily.  I will plan to see him back in about 2 weeks with a CT scan of the neck and chest to reassess for any signs of infection or osteomyelitis.  I instructed him to let me know immediately if he develops any fever or chills, increased erythema or tenderness in the region, or increased drainage from the wound site.  Plan:  He will return to see me in 2 weeks with a CT scan of the neck and chest.  I spent 10 minutes performing this established patient evaluation and > 50% of this time was spent face to face counseling and coordinating the care of this patient's left sternoclavicular joint infection    Gaye Pollack, MD [facility] [phone]

## 2018-11-05 ENCOUNTER — Inpatient Hospital Stay: Payer: Managed Care, Other (non HMO) | Admitting: Infectious Disease

## 2018-11-10 ENCOUNTER — Other Ambulatory Visit: Payer: Self-pay

## 2018-11-10 ENCOUNTER — Ambulatory Visit (INDEPENDENT_AMBULATORY_CARE_PROVIDER_SITE_OTHER): Payer: Managed Care, Other (non HMO) | Admitting: Family Medicine

## 2018-11-10 ENCOUNTER — Encounter: Payer: Self-pay | Admitting: Family Medicine

## 2018-11-10 DIAGNOSIS — E119 Type 2 diabetes mellitus without complications: Secondary | ICD-10-CM | POA: Diagnosis not present

## 2018-11-10 DIAGNOSIS — I10 Essential (primary) hypertension: Secondary | ICD-10-CM | POA: Diagnosis not present

## 2018-11-10 MED ORDER — TRAMADOL HCL 50 MG PO TABS: 50.0000 mg | ORAL_TABLET | Freq: Four times a day (QID) | ORAL | 0 refills | Status: DC | PRN

## 2018-11-10 NOTE — Progress Notes (Signed)
   Subjective:    Patient ID: Randy Oneal, male    DOB: [birth year], 53 y.o.   MRN: [id number]  HPIFollow up.  Thoracic abscess. Was seeing Dr. Cyndia Bent every week. CT scan April 15th. Next appt with Dr. Cyndia Bent is also April 15th. Pt is out of work. Has not been released to go back to work.  This patient has not been able to go back to work Still on short-term disability He is at high risk for coronavirus as well He has been encouraged to stay at home and not get out He also relates that for the most part he does try to get out is around his home but does not go around other people. Diabetes under decent control see his diabetic doctor later this month  Pt has been staying in the house as much as possible does go outside to walk when weather is nice.   Virtual Visit via Telephone Note Video was not capable patient did not have smart phone or computer or Internet hookup at this home I connected with Randy Oneal on 11/10/18 at 10:30 AM EDT by telephone and verified that I am speaking with the correct person using two identifiers.   I discussed the limitations, risks, security and privacy concerns of performing an evaluation and management service by telephone and the availability of in person appointments. I also discussed with the patient that there may be a patient responsible charge related to this service. The patient expressed understanding and agreed to proceed.  Swelling in legs during the day but goes down at night. Has not been taking lasix because potassium messes with his stomach.   Needs refills on metoprolol, amlodipine, lisinopril, one touch ultra strip test. Test 4 times daily. Sees Dr. Bradly Chris.   History of Present Illness:    Observations/Objective:   Assessment and Plan:   Follow Up Instructions:    I discussed the assessment and treatment plan with the patient. The patient was provided an opportunity to ask questions and all were answered. The patient  agreed with the plan and demonstrated an understanding of the instructions.   The patient was advised to call back or seek an in-person evaluation if the symptoms worsen or if the condition fails to improve as anticipated.  I provided 17  minutes of non-face-to-face time during this encounter.   Dayton Bailiff, LPN     Review of Systems     Objective:   Physical Exam        Assessment & Plan:  Currently patient states he is not taking the diuretic he states that the potassium is too big for him he states the swelling does go down every day so therefore we do not have to do the diuretic currently if we do choose to do so in the future we will use a lower dose of the lower dose of the potassium  Diabetes not quite at goal but he is working with diabetologist currently to get this under better control  Patient is on short-term disability because of his abscess I suspect that he will not be able to return to work until May 2020 he will check with his disability carrier to see if he needs any letter from his specialist or abscess  Patient should do a in person follow-up visit in 4 months

## 2018-11-12 ENCOUNTER — Encounter: Payer: Self-pay | Admitting: Family Medicine

## 2018-11-12 ENCOUNTER — Telehealth: Payer: Self-pay | Admitting: Family Medicine

## 2018-11-12 ENCOUNTER — Other Ambulatory Visit: Payer: Self-pay | Admitting: Family Medicine

## 2018-11-12 NOTE — Telephone Encounter (Signed)
Pt's wife [name] calling in stating Aylen got sick once last night and once this morning. He was complaining of feeling full so she gave him something for gas. She states he has no fever. He had a phone visit on 4/1 with Dr. Nicki Reaper  CB# [phone] Denyse Amass)

## 2018-11-12 NOTE — Telephone Encounter (Signed)
The patient took tramadol yesterday after we prescribed it for discomfort and made him nauseous he ended up throwing up the medicine I recommend that he stop the tramadol. He understood this he is not running any fevers chest pain or shortness of breath

## 2018-11-17 ENCOUNTER — Encounter: Payer: Self-pay | Admitting: Internal Medicine

## 2018-11-17 ENCOUNTER — Ambulatory Visit: Payer: Managed Care, Other (non HMO) | Admitting: Internal Medicine

## 2018-11-17 ENCOUNTER — Other Ambulatory Visit: Payer: Self-pay

## 2018-11-17 ENCOUNTER — Ambulatory Visit (INDEPENDENT_AMBULATORY_CARE_PROVIDER_SITE_OTHER): Payer: Managed Care, Other (non HMO) | Admitting: Internal Medicine

## 2018-11-17 DIAGNOSIS — E1165 Type 2 diabetes mellitus with hyperglycemia: Secondary | ICD-10-CM | POA: Diagnosis not present

## 2018-11-17 DIAGNOSIS — E1122 Type 2 diabetes mellitus with diabetic chronic kidney disease: Secondary | ICD-10-CM | POA: Diagnosis not present

## 2018-11-17 DIAGNOSIS — Z794 Long term (current) use of insulin: Secondary | ICD-10-CM | POA: Diagnosis not present

## 2018-11-17 DIAGNOSIS — N183 Chronic kidney disease, stage 3 unspecified: Secondary | ICD-10-CM

## 2018-11-17 MED ORDER — INSULIN LISPRO (1 UNIT DIAL) 100 UNIT/ML (KWIKPEN): PEN_INJECTOR | SUBCUTANEOUS | 11 refills | Status: DC

## 2018-11-17 MED ORDER — TRESIBA FLEXTOUCH 100 UNIT/ML ~~LOC~~ SOPN: 19.0000 [IU] | PEN_INJECTOR | Freq: Every day | SUBCUTANEOUS | 1 refills | Status: DC

## 2018-11-17 NOTE — Progress Notes (Signed)
Virtual Visit via Telephone Note  I connected with Randy Oneal  on 11/17/18 at  9:30 AM EDT by telephone and verified that I am speaking with the correct person using two identifiers.   I discussed the limitations, risks, security and privacy concerns of performing an evaluation and management service by telephone and the availability of in person appointments. I also discussed with the patient that there may be a patient responsible charge related to this service. The patient expressed understanding and agreed to proceed.  -Location of the patient :Father in-law home -Location of the provider : Office  -The names of all persons participating in the telemedicine service : CMA       PATIENT IDENTIFIER: Randy Oneal is a 53 y.o. male with a past medical history of T2DM and HTN. The patient has followed with Endocrinology clinic since 10/21/2018 for consultative assistance with management of his diabetes.  DIABETIC HISTORY:  Randy Oneal was diagnosed with T2DM in the year 2000, he has been on Metformin, historically his control has been poor due to non-compliance but pt became motivated after his prolonged hospitalization for chest wall infection in [place], 2020.  He has been on insulin therapy for years. His hemoglobin A1c has ranged from 13.0% in 08/2018, peaking at 16.8% in 2017   On his initial presentation to our clinic his A1c 13.0% and he was on Tresiba and Humalog.     SUBJECTIVE:   During the last visit (10/20/2018): A1c was 13.0%, decreased tresiba to 24 units and decreased humalog to 7 units with meals.   Today (11/17/2018): Randy Oneal is here for a 4 week virtual check-in on his diabetes. He checks his blood sugars 3 times daily, preprandial . The patient has  had hypoglycemic episodes since the last clinic visit, which typically occur 1 x /day - most often occurring during the day (lunch time). The patient is symptomatic with these episodes, with symptoms of weak, shaky.  Otherwise, the patient has not required any recent emergency interventions for hypoglycemia and has not had recent hospitalizations secondary to hyper or hypoglycemic episodes.    ROS: As per HPI and as detailed below: Review of Systems  Constitutional: Negative for chills and fever.  HENT: Negative for congestion and sore throat.   Respiratory: Negative for cough and shortness of breath.   Cardiovascular: Negative for chest pain and palpitations.      HOME DIABETES REGIMEN:  Tresiba 24 units daily - Taking 21 units  Humalog 7 units TID QAC    GLUCOSE LOG:  Date  Breakfast  Lunch  Supper  11/16/2018 213 60 104  4/6 163 58 120  4/5 79 104 112     HISTORY:  Past Medical History:  Past Medical History:  Diagnosis Date  . Diabetes mellitus    Insulin dependent  . Hypertension   . Renal insufficiency   . Sepsis due to Streptococcus, group B ([place]) 10/05/2018    Past Surgical History:  Past Surgical History:  Procedure Laterality Date  . APPLICATION OF WOUND VAC Left 08/27/2018   Procedure: APPLICATION OF WOUND VAC, left neck;  Surgeon: Gaye Pollack, MD;  Location: [place];  Service: Thoracic;  Laterality: Left;  . APPLICATION OF WOUND VAC Left 08/30/2018   Procedure: APPLICATION OF WOUND VAC;  Surgeon: Gaye Pollack, MD;  Location: [place];  Service: Thoracic;  Laterality: Left;  . COLONOSCOPY  06/24/2011   Procedure: COLONOSCOPY;  Surgeon: [name], MD;  [facility];  Service: Endoscopy;  Laterality: N/A;  8:30 AM  . STERNAL WOUND DEBRIDEMENT Left 08/27/2018   Procedure: Incision and DEBRIDEMENT Left Chest, Neck and Mediastinum;  Surgeon: Gaye Pollack, MD;  Location: [facility];  Service: Thoracic;  Laterality: Left;  . STERNAL WOUND DEBRIDEMENT Left 08/30/2018   Procedure: WOUND VAC CHANGE, LEFT CHEST AND NECK, POSSIBLE DEBRIDEMENT;  Surgeon: Gaye Pollack, MD;  Location: [facility];  Service: Thoracic;  Laterality: Left;     Social History:  reports that he  has never smoked. He has never used smokeless tobacco. He reports that he does not drink alcohol or use drugs. Family History:  Family History  Problem Relation Age of Onset  . Hypertension Mother   . Colon cancer Neg Hx       HOME MEDICATIONS: Allergies as of 11/17/2018      Reactions   Tramadol Nausea And Vomiting   Nausea and vomiting      Medication List       Accurate as of November 17, 2018  8:05 AM. Always use your most recent med list.        amLODipine 5 MG tablet Commonly known as:  NORVASC Take one tablet by mouth each day   atorvastatin 10 MG tablet Commonly known as:  LIPITOR Take 1 tablet (10 mg total) by mouth daily.   FreeStyle [employer] Use one sensor every 10 days.   furosemide 40 MG tablet Commonly known as:  LASIX Take 1 tablet (40 mg total) by mouth daily as needed. Take for weight gain of 3-5 lbs   glucose blood test strip Commonly known as:  Visual merchandiser Next Test USE TO TEST FOUR TIMES DAILY.   insulin lispro 100 UNIT/ML KwikPen Commonly known as:  HumaLOG KwikPen Inject 0.07 mLs (7 Units total) into the skin 3 (three) times daily.   Insulin Pen Needle 32G X 4 MM Misc Commonly known as:  BD Pen Needle Nano 2nd Gen Four times daily   lisinopril 10 MG tablet Commonly known as:  PRINIVIL,ZESTRIL   metoprolol tartrate 50 MG tablet Commonly known as:  LOPRESSOR Take one half tablet by mouth twice daily   Microlet Lancets Misc USE FOUR TIMES A DAY AS DIRECTED.   nystatin powder Commonly known as:  MYCOSTATIN/NYSTOP Apply topically 2 (two) times daily. To groin and scrotum   Oxycodone HCl 10 MG Tabs Take 1 tablet (10 mg total) by mouth every 4 (four) hours as needed for moderate pain or severe pain.   pantoprazole 40 MG tablet Commonly known as:  Protonix Take 1 tablet (40 mg total) by mouth daily.   potassium chloride SA 20 MEQ tablet Commonly known as:  K-DUR,KLOR-CON Take 1 tablet (20 mEq total) by mouth daily as  needed. Only take on days you use Lasix   sildenafil 20 MG tablet Commonly known as:  REVATIO May take up to 5 before relations as directed   Tresiba FlexTouch 100 UNIT/ML Sopn FlexTouch Pen Generic drug:  insulin degludec Inject 0.24 mLs (24 Units total) into the skin at bedtime.          DATA REVIEWED:  Lab Results  Component Value Date   HGBA1C 13.0 (A) 08/16/2018   HGBA1C >15.5 (H) 02/04/2018   HGBA1C >15.5 (H) 07/09/2017   Lab Results  Component Value Date   MICROALBUR 24.5 11/05/2015   LDLCALC 106 (H) 02/04/2018   CREATININE 1.63 (H) 10/20/2018   Lab Results  Component Value  Date   MICRALBCREAT 383 (H) 11/05/2015     Lab Results  Component Value Date   CHOL 183 02/04/2018   HDL 65 02/04/2018   LDLCALC 106 (H) 02/04/2018   TRIG 62 02/04/2018   CHOLHDL 2.8 02/04/2018         ASSESSMENT / PLAN / RECOMMENDATIONS:   1) Type 2 Diabetes Mellitus, Poorly controlled, With CKD Oneal and neuropathic complications - Most recent A1c of 13.0 %. Goal A1c < 7.0 %.   Plan:  - Praised the patient on dietary discretions,medication adherence and keeping up with glucose checks.  - He has been noted to have hypoglycemia at lunch time, hence will reduce his humalog dose for Breakfast as below - Pt tends to snack at bedtime , hence intermittent hyperglycemia in the morning, discussed low carb snacks, if he continues to snack at bedtime we could offer him a small dose of humalog with the snack - Based on a fasting glucose of 79 mg/dL, will reduce his basal insulin  - We discussed considering adding on a GLP-1 agonist to see if we would be able to stop his prandial insulin .  - We reviewed rule 15 in details for hypoglycemia       MEDICATIONS:  Decrease Tresiba to 19 units daily  Decrease Humalog to 6 units with Breakfast   Continue Humalog 7 units with Lunch and supper   EDUCATION / INSTRUCTIONS:  BG monitoring instructions: Patient is instructed to check his blood  sugars 4 times a day, fasting and bedtime.  Call [facility] if: BG persistently < 70 or > 300. . I reviewed the Rule of 15 for the treatment of hypoglycemia in detail with the patient.      I discussed the assessment and treatment plan with the patient. The patient was provided an opportunity to ask questions and all were answered. The patient agreed with the plan and demonstrated an understanding of the instructions.   The patient was advised to call back or seek an in-person evaluation if the symptoms worsen or if the condition fails to improve as anticipated.  I provided 12 minutes of non-face-to-face time during this encounter.     F/U in 3 months    Signed electronically by: Mack Guise, MD  [facility]  [facility] [address] [place], [place] [address] Phone: [phone] FAX: [phone]   CC: [name], [place] [address] Phone: [phone]  Fax: [phone]  Return to Endocrinology clinic as below: Future Appointments  Date Time Provider [facility]  11/17/2018  9:30 AM Shamleffer, Melanie Crazier, MD [facility] None  11/24/2018  9:00 AM GI-315 CT 1 GI-315[facility]  11/24/2018  9:10 AM GI-315 CT 1 GI-315CT [facility]  11/24/2018 11:30 AM Gaye Pollack, MD [facility]  12/15/2018 10:15 AM Bronson Ing, DPM [facility]

## 2018-11-23 ENCOUNTER — Other Ambulatory Visit: Payer: Self-pay

## 2018-11-24 ENCOUNTER — Ambulatory Visit (INDEPENDENT_AMBULATORY_CARE_PROVIDER_SITE_OTHER): Payer: Managed Care, Other (non HMO) | Admitting: Surgery

## 2018-11-24 ENCOUNTER — Ambulatory Visit
Admission: RE | Admit: 2018-11-24 | Discharge: 2018-11-24 | Disposition: A | Discharge status: Home / Self Care | Payer: Managed Care, Other (non HMO) | Source: Ambulatory Visit | Attending: Surgery | Admitting: Surgery

## 2018-11-24 ENCOUNTER — Encounter: Payer: Self-pay | Admitting: Surgery

## 2018-11-24 VITALS — BP 208/116 | HR 76 | Temp 97.1°F | Resp 16 | Ht 70.0 in | Wt 190.0 lb

## 2018-11-24 DIAGNOSIS — Z09 Encounter for follow-up examination after completed treatment for conditions other than malignant neoplasm: Secondary | ICD-10-CM

## 2018-11-24 DIAGNOSIS — M009 Pyogenic arthritis, unspecified: Secondary | ICD-10-CM | POA: Diagnosis not present

## 2018-11-24 NOTE — Progress Notes (Signed)
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  He underwent surgical drainage on 08/30/2018 followed by a complete course of intravenous antibiotics per infectious disease and treatment with VAC wound dressings.  When I last saw him on 11/03/2018 the wound is just about closed but there is a small amount of granulation tissue remaining which was cauterized with a silver nitrate stick.  The patient has been keeping the site covered with a dry dressing.  He said that it is still draining some fluid daily but it varies.  He has had no fever or chills.  He has no significant discomfort from the area.  He had a CT scan of the neck and chest this morning to reevaluate the area since he still has significant swelling over the left medial clavicular area.  Current Outpatient Medications  Medication Sig Dispense Refill   amLODipine (NORVASC) 5 MG tablet Take one tablet by mouth each day 90 tablet 1   atorvastatin (LIPITOR) 10 MG tablet Take 1 tablet (10 mg total) by mouth daily. 30 tablet 06   Continuous Blood Gluc Sensor (FREESTYLE LIBRE SENSOR SYSTEM) MISC Use one sensor every 10 days. 3 each 2   glucose blood (BAYER CONTOUR NEXT TEST) test strip USE TO TEST FOUR TIMES DAILY. 150 each 2   insulin lispro (HUMALOG KWIKPEN) 100 UNIT/ML KwikPen Inject 0.06 mLs (6 Units total) into the skin daily with breakfast AND 0.07 mLs (7 Units total) 2 (two) times daily before lunch and supper. 15 mL 11   Insulin Pen Needle (BD PEN NEEDLE NANO 2ND GEN) 32G X 4 MM MISC Four times daily 150 each 11   lisinopril (PRINIVIL,ZESTRIL) 10 MG tablet      metoprolol tartrate (LOPRESSOR) 50 MG tablet Take one half tablet by mouth twice daily 60 tablet 3   MICROLET LANCETS MISC USE FOUR TIMES A DAY AS DIRECTED. 100 each 0   nystatin (MYCOSTATIN/NYSTOP) powder Apply topically 2 (two) times daily. To  groin and scrotum 15 g 0   pantoprazole (PROTONIX) 40 MG tablet Take 1 tablet (40 mg total) by mouth daily. 30 tablet 4   sildenafil (REVATIO) 20 MG tablet May take up to 5 before relations as directed 50 tablet 4   TRESIBA FLEXTOUCH 100 UNIT/ML SOPN FlexTouch Pen Inject 0.19 mLs (19 Units total) into the skin at bedtime. 15 mL 1   furosemide (LASIX) 40 MG tablet Take 1 tablet (40 mg total) by mouth daily as needed. Take for weight gain of 3-5 lbs (Patient not taking: Reported on 11/10/2018) 30 tablet 3   oxyCODONE 10 MG TABS Take 1 tablet (10 mg total) by mouth every 4 (four) hours as needed for moderate pain or severe pain. (Patient not taking: Reported on 11/10/2018) 42 tablet 0   potassium chloride SA (K-DUR,KLOR-CON) 20 MEQ tablet Take 1 tablet (20 mEq total) by mouth daily as needed. Only take on days you use Lasix (Patient not taking: Reported on 11/10/2018) 30 tablet 0   No current facility-administered medications for this visit.      Physical Exam: BP (!) 208/116 (BP Location: Right Arm, Patient Position: Sitting, Cuff Size: Normal) Comment (Cuff Size): taken manually x 2   Pulse 76    Temp (!) 97.1 F (36.2 C) (Tympanic)    Resp 16    Ht 5\' 10"  (1.778 m)    Wt 190 lb (  86.2 kg)    SpO2 97% Comment: RA   BMI 27.26 kg/m  He looks well. The wound is essentially healed except for a very small amount of granulation tissue in 1 location.  There is no erythema or fluctuance.  There is still marked protuberance of the area over the medial left clavicle and left sternoclavicular joint.  There is no tenderness over the neck or chest wall.  Diagnostic Tests:  CLINICAL DATA:  Left neck inflammation.  EXAM: CT CHEST WITHOUT CONTRAST  TECHNIQUE: Multidetector CT imaging of the chest was performed following the standard protocol without IV contrast due to renal insufficiency.  COMPARISON:  CT scan of August 22, 2018.  FINDINGS: Cardiovascular: No significant vascular findings. Normal  heart size. No pericardial effusion.  Mediastinum/Nodes: Thyroid gland and esophagus are unremarkable. No mediastinal or axillary adenopathy is noted. Due to the lack of intravenous contrast, it is very difficult to assess for the presence of adenopathy or other inflammation in the supraclavicular or cervical regions. 11 mm lymph node is noted best seen on image number 18 of series 2. No definite abscess or fluid collection is noted, although this can not be excluded due to lack of intravenous contrast.  Lungs/Pleura: Lungs are clear. No pleural effusion or pneumothorax.  Upper Abdomen: No acute abnormality.  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 The medial end of the left clavicle has been displaced anteriorly. There is lytic destruction involving the inferior aspect of the medial end of the left clavicle concerning for possible osteomyelitis. Probable erosion or lytic destruction of the adjacent left side of the manubrium is also noted.  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 There is now noted lytic destruction involving the medial end of the left clavicle and potentially the adjacent portion of the manubrium concerning for possible osteomyelitis. There is anterior displacement of the medial end of the left clavicle is well, with the development of a large amount of heterotopic bone formation around the joint. Some mild adenopathy is noted in the left supraclavicular region and other surrounding soft tissues suggesting inflammation, although evaluation is limited due to the lack of intravenous contrast. No definite abscess or fluid collection is noted, although this can not be excluded on this unenhanced study.   Electronically  Signed   By: Marijo Conception M.D.   On: 11/24/2018 10:01   CLINICAL DATA:  Follow-up inflammatory process in the left neck and supraclavicular region. Chronic renal failure.  EXAM: CT NECK WITHOUT CONTRAST  TECHNIQUE: Multidetector CT imaging of the neck was performed following the standard protocol without intravenous contrast.  COMPARISON:  08/17/2018  FINDINGS: Pharynx and larynx: No mucosal or submucosal lesion.  Salivary glands: Parotid and submandibular glands are normal.  Thyroid: Normal  Lymph nodes: No primary nodal process.  Vascular: Atherosclerotic change of the carotid arteries. No acute vascular finding. Intravascular low-density suggests anemia.  Limited intracranial: Negative  Visualized orbits: Not included  Mastoids and visualized paranasal sinuses: Clear  Skeleton: Destructive arthropathy at the left sternoclavicular joint consistent with previous septic arthritis. Erosive destruction of the distal clavicle with multiple bone fragments surrounding the region of the left sternoclavicular joint. Surrounding soft tissues appear prominent and the possibility of ongoing active infection does exist, but is not absolutely a stabbed wished.  Upper chest: Lung apices are clear.  Other: As noted above, increased soft tissue prominence in the region of the destructive left sternoclavicular arthropathy could represent ongoing active infection or may simply relate to the treated postinflammatory destructive changes. Extension of the inflammatory changes into the left low neck seen previously is resolved.  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 Some minor change also affecting the left side of the upper sternum. In the region of the  extensive bony fragments, there is surrounding soft tissue prominence. This may simply be sequela of the previously treated inflammatory disease, but I can not rule out the possibility some degree of ongoing active infection. Certainly there is less soft tissue inflammatory change than was seen previously, resolution of the inflammatory change extending into the lower left neck, and an absence air bubbles which were seen previously.   Electronically Signed   By: Nelson Chimes M.D.   On: 11/24/2018 09:47   Impression:  The wound is essentially closed except for tiny bit of granulation tissue which I treated again with silver nitrate.  There is less swelling in the area and there is no erythema or fluctuance.  There is no tenderness.  My overall impression is that it looks better than it did when I saw him a few weeks ago.  The CT scan of the chest and neck were personally reviewed today by me.  There is no significant soft tissue swelling and no sign of any fluid collection or abscess.  The medial head of the left clavicle is displaced more anteriorly due to loss of the left sternoclavicular joint and I think this is responsible for the protuberance of this region on physical exam.  There is also significant heterotopic bone formation around the area which is contributing to that protuberance.  There is some destruction of the bone of the medial head of the left clavicle as well as the left side of the manubrium.  I do not think there is any way to decide whether this is persistent osteomyelitis or destruction due to complete debridement of the left sternoclavicular joint.  The bone of the clavicle and manubrium appeared hard with no evidence of osteomyelitis at the time of his initial surgery.  He did receive a complete course of antibiotics.  I think the best option at this time is continued observation.  If the residual wound does not completely close and continues to drain then it may mean that  there is persistent osteomyelitis.  If he develops more swelling or redness, tenderness, or fever that also means that there is persistent osteomyelitis and this would require more radical debridement with removal of the medial portion of the clavicle and part of the manubrium.  This would be a much larger surgery for him with higher risk and more morbidity and I would only want to do that if there is clear evidence that this is not resolving or getting worse.  I discussed all this with him and he is in agreement.  Plan:  He will continue daily dressing changes over the granulation tissue that I cauterized today.  I will plan to see him back in 2 weeks for reexamination.  He will contact me if he develops any fever or chills or if there is more swelling, redness, or tenderness around the region.   Gaye Pollack, MD [facility] [phone]

## 2018-11-29 ENCOUNTER — Telehealth: Payer: Self-pay | Admitting: Family Medicine

## 2018-11-29 DIAGNOSIS — Z029 Encounter for administrative examinations, unspecified: Secondary | ICD-10-CM

## 2018-11-29 NOTE — Telephone Encounter (Signed)
Patient had his short-term disability faxed over more forms to be filled out for updated information and faxed back . Form is in your box to be filled in at highlighted places. Please review form,date and sign.

## 2018-12-03 NOTE — Telephone Encounter (Signed)
This was completed-ready for pick up in my office Communication being sent to his thoracic surgeon for them to complement on his next follow-up visit in May when he can return to work

## 2018-12-06 ENCOUNTER — Encounter: Payer: Managed Care, Other (non HMO) | Admitting: Surgery

## 2018-12-08 ENCOUNTER — Encounter: Payer: Managed Care, Other (non HMO) | Admitting: Surgery

## 2018-12-09 ENCOUNTER — Other Ambulatory Visit: Payer: Self-pay

## 2018-12-10 ENCOUNTER — Ambulatory Visit (INDEPENDENT_AMBULATORY_CARE_PROVIDER_SITE_OTHER): Payer: Managed Care, Other (non HMO) | Admitting: Surgery

## 2018-12-10 ENCOUNTER — Encounter: Payer: Self-pay | Admitting: Surgery

## 2018-12-10 VITALS — BP 167/96 | HR 78 | Temp 97.8°F | Resp 20 | Ht 70.0 in | Wt 194.0 lb

## 2018-12-10 DIAGNOSIS — Z09 Encounter for follow-up examination after completed treatment for conditions other than malignant neoplasm: Secondary | ICD-10-CM

## 2018-12-10 DIAGNOSIS — Z4801 Encounter for change or removal of surgical wound dressing: Secondary | ICD-10-CM | POA: Diagnosis not present

## 2018-12-10 DIAGNOSIS — M009 Pyogenic arthritis, unspecified: Secondary | ICD-10-CM | POA: Diagnosis not present

## 2018-12-10 NOTE — Progress Notes (Signed)
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 He underwent surgical drainage on 08/30/2018 followed by a complete course of intravenous antibiotics per infectious disease and treatment with VACwound dressings.   The wound is essentially closed except for a small bit of granulation tissue which I have been cauterizing with a silver nitrate stick about every 2 weeks.  His recent CT scan of the chest and neck showed no significant soft tissue swelling and no sign of any fluid collection or abscess.  The medial head of the left clavicle was displaced anteriorly.  There is also significant heterotopic bone formation around the area and some destruction of the bone of the medial head of left clavicle and left side of the manubrium.  It remains unclear to me if this is persistent osteomyelitis or destruction due to the complete debridement of the left sternoclavicular joint.  Since I last saw him on 11/24/2018 he said that he has been feeling well.  He denies any fever or chills.  He has a small amount of drainage from the wound which varies from day-to-day.  He has been keeping the wound covered with a small piece of gauze.  Current Outpatient Medications  Medication Sig Dispense Refill  . amLODipine (NORVASC) 5 MG tablet Take one tablet by mouth each day 90 tablet 1  . atorvastatin (LIPITOR) 10 MG tablet Take 1 tablet (10 mg total) by mouth daily. 30 tablet 06  . Continuous Blood Gluc Sensor (FREESTYLE LIBRE SENSOR SYSTEM) MISC Use one sensor every 10 days. 3 each 2  . furosemide (LASIX) 40 MG tablet Take 1 tablet (40 mg total) by mouth daily as needed. Take for weight gain of 3-5 lbs 30 tablet 3  . glucose blood (BAYER CONTOUR NEXT TEST) test strip USE TO TEST FOUR TIMES DAILY. 150 each 2  . insulin lispro (HUMALOG KWIKPEN) 100 UNIT/ML KwikPen Inject 0.06 mLs (6 Units total) into the  skin daily with breakfast AND 0.07 mLs (7 Units total) 2 (two) times daily before lunch and supper. 15 mL 11  . Insulin Pen Needle (BD PEN NEEDLE NANO 2ND GEN) 32G X 4 MM MISC Four times daily 150 each 11  . lisinopril (PRINIVIL,ZESTRIL) 10 MG tablet     . metoprolol tartrate (LOPRESSOR) 50 MG tablet Take one half tablet by mouth twice daily 60 tablet 3  . MICROLET LANCETS MISC USE FOUR TIMES A DAY AS DIRECTED. 100 each 0  . nystatin (MYCOSTATIN/NYSTOP) powder Apply topically 2 (two) times daily. To groin and scrotum 15 g 0  . pantoprazole (PROTONIX) 40 MG tablet Take 1 tablet (40 mg total) by mouth daily. 30 tablet 4  . potassium chloride SA (K-DUR,KLOR-CON) 20 MEQ tablet Take 1 tablet (20 mEq total) by mouth daily as needed. Only take on days you use Lasix 30 tablet 0  . sildenafil (REVATIO) 20 MG tablet May take up to 5 before relations as directed 50 tablet 4  . TRESIBA FLEXTOUCH 100 UNIT/ML SOPN FlexTouch Pen Inject 0.19 mLs (19 Units total) into the skin at bedtime. 15 mL 1  . oxyCODONE 10 MG TABS Take 1 tablet (10 mg total) by mouth every 4 (four) hours as needed for moderate pain or severe pain. (Patient not taking: Reported on 12/10/2018) 42 tablet 0   No current facility-administered medications for this visit.      Physical Exam: BP Marland Kitchen)  167/96   Pulse 78   Temp 97.8 F (36.6 C) (Skin)   Resp 20   Ht 5\' 10"  (1.778 m)   Wt 194 lb (88 kg)   SpO2 99% Comment: RA  BMI 27.84 kg/m  He looks well. There is still a small sinus tract with some granulation tissue at the surface.  I cannot pass a cotton swab into the tract due to its small size.  The protuberance of the medial portion of the left clavicle was unchanged.  There is no fluctuance, tenderness, or erythema.  Diagnostic Tests:  None today  Impression:  Overall he continues to do well without any signs of active infection.  There is still a small draining site from the middle of the wound with some granulation tissue at the  surface.  This is tiny and I cannot insert a cotton swab into it.  I cauterized the granulation tissue again.  I think the best option at this time is to continue conservative therapy and keep the wound covered with a dry dressing daily.  Hopefully this will resolve on its own over time.  If there is any worsening in the appearance of the wound or development of fever, chills, or other signs of infection that would require extensive debridement including the medial portion of the clavicle and part of the manubrium.  I think this would be a fairly debilitating and higher risk surgery and I would only recommend proceeding with that if there were signs of worsening.  It is possible that if he has active osteomyelitis he may continue to drain from the wound for a long period of time.  We will have to continue close follow-up and make decisions about any further need for treatment as time goes on.  He asked me about when I thought he could return to work.  I do not think there is any contraindication to returning to work as far as the wound is concerned but I think he needs to make sure that he is strong enough to return to his previous job.  He will think about this further and let me know when he feels like he is strong enough to return to work.  Plan:  He will continue daily dressing changes over the draining site.  I will plan to see him back in 1 month to reevaluate the wound.   Gaye Pollack, MD [facility] [phone]

## 2018-12-13 ENCOUNTER — Telehealth: Payer: Self-pay | Admitting: Family Medicine

## 2018-12-13 NOTE — Telephone Encounter (Signed)
Would like a recommendation for a diabetic eye doctor for him to go see.

## 2018-12-14 NOTE — Telephone Encounter (Signed)
I would recommend starting off with the general eye doctor then depending on the findings if necessary they can refer him to a retinal specialist  Good general eye specialist is Dr. Jorja Loa  If he is already seen a general eye doctor needs a retina specialist let me know

## 2018-12-14 NOTE — Telephone Encounter (Signed)
Tried to contact patient. Pt voicemail is full; unable to leave message

## 2018-12-15 ENCOUNTER — Ambulatory Visit: Payer: Managed Care, Other (non HMO) | Admitting: Podiatrist

## 2018-12-15 NOTE — Telephone Encounter (Signed)
Left message to return call 

## 2018-12-16 NOTE — Telephone Encounter (Signed)
Pt states he had a bad night last night. Vomited a clear liquid twice. No fever, no abdominal pain. Has had this problem before and talked to dr scott about it on the phone. Got better since talking with dr Nicki Reaper and came back last night. Tried some ginerale and water. Has not ate anything today. No nausea states he will try to get something light for breaksfast to see if he can keep it down.   Lower back just above rear he was having pains. States he tried the stretching exercises dr scott told him to do and it helps a little but does not relieve the pain.   [place] [facility] Call pt on [phone]

## 2018-12-16 NOTE — Telephone Encounter (Signed)
Discussed with pt. And dr cotter's number given to pt.

## 2018-12-16 NOTE — Telephone Encounter (Signed)
Sugar was also high this morning 194. Taking insulin with meals and at night. Takes around 7 units with meals and the tresiba is 20 - 21 units at night. Normally he states sugar is good in the morning but today it was a little off.

## 2018-12-16 NOTE — Telephone Encounter (Signed)
As for the sugars he is under the care of endocrinology I recommend if his sugars continue to stay elevated he should touch base with them  As for the vomiting tried a light diet with liquids today if he has more vomiting spells later today or through the night to please call us tomorrow morning and we could work him into the schedule for it in office visit  If he gets better then that is not necessary

## 2018-12-16 NOTE — Telephone Encounter (Signed)
Discussed with pt. Pt verbalized understanding.  °

## 2018-12-17 ENCOUNTER — Other Ambulatory Visit: Payer: Self-pay | Admitting: Family Medicine

## 2018-12-20 LAB — HM DIABETES EYE EXAM

## 2018-12-28 ENCOUNTER — Encounter: Payer: Self-pay | Admitting: Family Medicine

## 2018-12-28 ENCOUNTER — Telehealth: Payer: Self-pay | Admitting: Family Medicine

## 2018-12-28 NOTE — Telephone Encounter (Signed)
Pt's wife is calling in stating pt is having lower middle back pain. They believe it is from gas and would like to know what he could do for this she states he is burping quite often.

## 2018-12-28 NOTE — Telephone Encounter (Signed)
It would be fine to use any over-the-counter measures such as Gas-X If he is having ongoing troubles this would need evaluation to figure it out

## 2018-12-28 NOTE — Telephone Encounter (Signed)
Also pt is having cataract surgery tomorrow on one eye and again next Wednesday for the other eye.

## 2018-12-28 NOTE — Telephone Encounter (Signed)
Left message to return call 

## 2018-12-30 NOTE — Telephone Encounter (Signed)
Wife(DPR) advised It would be fine to use any over-the-counter measures such as Gas-X If he is having ongoing troubles this would need evaluation to figure it out. Wife verbalized understanding.

## 2019-01-07 ENCOUNTER — Telehealth: Payer: Self-pay | Admitting: Family Medicine

## 2019-01-07 DIAGNOSIS — M79606 Pain in leg, unspecified: Secondary | ICD-10-CM

## 2019-01-07 NOTE — Telephone Encounter (Signed)
I hope everything will go well with him should he need to see [place] anytime in the near future please call

## 2019-01-07 NOTE — Telephone Encounter (Signed)
May have physical therapy for leg pain evaluate and treat

## 2019-01-07 NOTE — Telephone Encounter (Signed)
Son said he was told to call us after his dad had eye surgery with an update. Had it done on Wednesday.  [phone]

## 2019-01-07 NOTE — Telephone Encounter (Signed)
Discussed with pt and he wants to know if you would order physical therapy for his leg pain.

## 2019-01-07 NOTE — Telephone Encounter (Signed)
Left message to return call 

## 2019-01-07 NOTE — Telephone Encounter (Signed)
Order for pt put in. Left message to return call with pt to notify.

## 2019-01-10 NOTE — Telephone Encounter (Signed)
Pt.notified

## 2019-01-11 ENCOUNTER — Other Ambulatory Visit: Payer: Self-pay

## 2019-01-11 ENCOUNTER — Encounter (INDEPENDENT_AMBULATORY_CARE_PROVIDER_SITE_OTHER): Payer: Self-pay | Admitting: Ophthalmology

## 2019-01-11 ENCOUNTER — Ambulatory Visit (INDEPENDENT_AMBULATORY_CARE_PROVIDER_SITE_OTHER): Payer: Managed Care, Other (non HMO) | Admitting: Ophthalmology

## 2019-01-11 DIAGNOSIS — I10 Essential (primary) hypertension: Secondary | ICD-10-CM | POA: Diagnosis not present

## 2019-01-11 DIAGNOSIS — Z961 Presence of intraocular lens: Secondary | ICD-10-CM

## 2019-01-11 DIAGNOSIS — H3581 Retinal edema: Secondary | ICD-10-CM

## 2019-01-11 DIAGNOSIS — H4313 Vitreous hemorrhage, bilateral: Secondary | ICD-10-CM

## 2019-01-11 DIAGNOSIS — E113513 Type 2 diabetes mellitus with proliferative diabetic retinopathy with macular edema, bilateral: Secondary | ICD-10-CM | POA: Diagnosis not present

## 2019-01-11 DIAGNOSIS — H35033 Hypertensive retinopathy, bilateral: Secondary | ICD-10-CM

## 2019-01-11 MED ORDER — BEVACIZUMAB CHEMO INJECTION 1.25MG/0.05ML SYRINGE FOR KALEIDOSCOPE
1.2500 mg | INTRAVITREAL | Status: AC | PRN
  Administered 2019-01-11: 1.25 mg via INTRAVITREAL

## 2019-01-11 NOTE — Progress Notes (Addendum)
[facility] Note  01/11/2019     CHIEF COMPLAINT Patient presents for Retina Evaluation   HISTORY OF PRESENT ILLNESS: Randy Oneal is a 53 y.o. male who presents to the clinic today for:   HPI    Retina Evaluation    In both eyes.  This started 2 weeks ago.  Duration of 2 weeks.  Associated Symptoms Negative for Flashes, Blind Spot, Photophobia, Scalp Tenderness, Fever, Floaters, Pain, Glare, Jaw Claudication, Weight Loss, Distortion, Redness, Trauma, Shoulder/Hip pain and Fatigue.  Context:  distance vision, mid-range vision and near vision.  Treatments tried include no treatments.  I, the attending physician,  performed the HPI with the patient and updated documentation appropriately.          Comments    Referral from Dr. Gershon Crane for blurry vision S/P CE with IOL OU. Patient states vision better OU since cataract surgery, but vision is not crisp and clear. CE with IOL OS done 2 weeks ago, OD was done about 1 week ago. Using ketorolac 2 gtts tid OU, and PF 2 gtts tid OU. Saw Dr. Gershon Crane today.  Patient is IDDM X 20 years and last a1c was 10 about 2 months ago. Didn't check BS today. No kidney disease, no neuropathy.        Last edited by Bernarda Caffey, MD on 01/11/2019 10:43 AM. (History)    pt states Dr. Gershon Crane did cataract sx 2 weeks ago on the left eye and 6 days ago on his right eye, he states he feels like he can see better since cataract sx, bc things are less smokey, but they are not "crisp" now, he states he has been a pt of Dr. Gershon Crane for many years, he states he is diabetic, he states in January he had sx on the left side of his collar bone for an abscess, he states that he is just about to be released from that sx, he states he had a callus shaved off his big toe and the drs think shavings from that may have gotten into his blood streamed and caused an infection, he states he spent many weeks in the hospital and got very weak, he states since  he is currently out of work, he is trying to get his health under control, he states he has always had a hard time keeping his diabetes under control  Referring physician: Rutherford Guys, MD [place], [place] [address]  HISTORICAL INFORMATION:   Selected notes from the MEDICAL RECORD NUMBER Referred by Dr.Mark Gershon Crane for concern of decreased vision post cataract sx LEE:  Ocular Hx- PMH-   CURRENT MEDICATIONS: Current Outpatient Medications (Ophthalmic Drugs)  Medication Sig  . ketorolac (ACULAR) 0.5 % ophthalmic solution Place 2 drops into both eyes 3 (three) times daily.  . prednisoLONE acetate (PRED FORTE) 1 % ophthalmic suspension Place 2 drops into both eyes 3 (three) times daily.   No current facility-administered medications for this visit.  (Ophthalmic Drugs)   Current Outpatient Medications (Other)  Medication Sig  . amLODipine (NORVASC) 5 MG tablet Take one tablet by mouth each day  . atorvastatin (LIPITOR) 10 MG tablet TAKE ONE TABLET BY MOUTH ONCE DAILY.  Marland Kitchen Continuous Blood Gluc Sensor (FREESTYLE LIBRE SENSOR SYSTEM) MISC Use one sensor every 10 days.  . furosemide (LASIX) 40 MG tablet Take 1 tablet (40 mg total) by mouth daily as needed. Take for weight gain of 3-5 lbs  . glucose blood (BAYER  CONTOUR NEXT TEST) test strip USE TO TEST FOUR TIMES DAILY.  Marland Kitchen insulin lispro (HUMALOG KWIKPEN) 100 UNIT/ML KwikPen Inject 0.06 mLs (6 Units total) into the skin daily with breakfast AND 0.07 mLs (7 Units total) 2 (two) times daily before lunch and supper.  . Insulin Pen Needle (BD PEN NEEDLE NANO 2ND GEN) 32G X 4 MM MISC Four times daily  . lisinopril (PRINIVIL,ZESTRIL) 10 MG tablet   . metoprolol tartrate (LOPRESSOR) 50 MG tablet Take one half tablet by mouth twice daily  . MICROLET LANCETS MISC USE FOUR TIMES A DAY AS DIRECTED.  Marland Kitchen nystatin (MYCOSTATIN/NYSTOP) powder Apply topically 2 (two) times daily. To groin and scrotum  . pantoprazole (PROTONIX) 40 MG tablet Take  1 tablet (40 mg total) by mouth daily.  . potassium chloride SA (K-DUR,KLOR-CON) 20 MEQ tablet Take 1 tablet (20 mEq total) by mouth daily as needed. Only take on days you use Lasix  . sildenafil (REVATIO) 20 MG tablet May take up to 5 before relations as directed  . TRESIBA FLEXTOUCH 100 UNIT/ML SOPN FlexTouch Pen Inject 0.19 mLs (19 Units total) into the skin at bedtime.  Marland Kitchen oxyCODONE 10 MG TABS Take 1 tablet (10 mg total) by mouth every 4 (four) hours as needed for moderate pain or severe pain. (Patient not taking: Reported on 12/10/2018)   No current facility-administered medications for this visit.  (Other)      REVIEW OF SYSTEMS: ROS    Positive for: Endocrine, Eyes   Negative for: Constitutional, Gastrointestinal, Neurological, Skin, Genitourinary, Musculoskeletal, HENT, Cardiovascular, Respiratory, Psychiatric, Allergic/Imm, Heme/Lymph   Last edited by Roselee Nova D on 01/11/2019 10:02 AM. (History)       ALLERGIES Allergies  Allergen Reactions  . Tramadol Nausea And Vomiting    Nausea and vomiting    PAST MEDICAL HISTORY Past Medical History:  Diagnosis Date  . Diabetes mellitus    Insulin dependent  . Hypertension   . Renal insufficiency   . Sepsis due to Streptococcus, group B ([place]) 10/05/2018   Past Surgical History:  Procedure Laterality Date  . APPLICATION OF WOUND VAC Left 08/27/2018   Procedure: APPLICATION OF WOUND VAC, left neck;  Surgeon: Gaye Pollack, MD;  Location: [facility];  Service: Thoracic;  Laterality: Left;  . APPLICATION OF WOUND VAC Left 08/30/2018   Procedure: APPLICATION OF WOUND VAC;  Surgeon: Gaye Pollack, MD;  Location: [facility];  Service: Thoracic;  Laterality: Left;  . COLONOSCOPY  06/24/2011   Procedure: COLONOSCOPY;  Surgeon: [name], MD;  [facility];  Service: Endoscopy;  Laterality: N/A;  8:30 AM  . STERNAL WOUND DEBRIDEMENT Left 08/27/2018   Procedure: Incision and DEBRIDEMENT Left Chest, Neck and Mediastinum;   Surgeon: Gaye Pollack, MD;  [facility];  Service: Thoracic;  Laterality: Left;  . STERNAL WOUND DEBRIDEMENT Left 08/30/2018   Procedure: WOUND VAC CHANGE, LEFT CHEST AND NECK, POSSIBLE DEBRIDEMENT;  Surgeon: Gaye Pollack, MD;  Location: [facility] OR;  Service: Thoracic;  Laterality: Left;    FAMILY HISTORY Family History  Problem Relation Age of Onset  . Hypertension Mother   . Colon cancer Neg Hx     SOCIAL HISTORY Social History   Tobacco Use  . Smoking status: Never Smoker  . Smokeless tobacco: Never Used  Substance Use Topics  . Alcohol use: No  . Drug use: No         OPHTHALMIC EXAM:  Base Eye Exam    Visual Acuity (  Snellen - Linear)      Right Left   Dist [place] 20/400 -2 20/800   Dist ph [place] NI 20/300 -2       Tonometry (Tonopen, 10:20 AM)      Right Left   Pressure 11 13       Pupils      Dark Light Shape React APD   Right 8 8 Round Minimal None   Left 8 8 Round Minimal None       Visual Fields (Counting fingers)      Left Right    Full Full       Extraocular Movement      Right Left    Full, Ortho Full, Ortho       Neuro/Psych    Oriented x3:  Yes   Mood/Affect:  Normal       Dilation    Both eyes:  1.0% Mydriacyl, 2.5% Phenylephrine @ 10:20 AM        Slit Lamp and Fundus Exam    Slit Lamp Exam      Right Left   Lids/Lashes mild Meibomian gland dysfunction mild Meibomian gland dysfunction   Conjunctiva/Sclera White and quiet White and quiet   Cornea mild Arcus, 2+ Descemet's folds, Well healed temporal cataract wounds mild Arcus, 2+ Descemet's folds, Well healed temporal cataract wounds   Anterior Chamber 4+pigment Deep, 3-4+pigment   Iris slightly irregular, dilated Round and dilated   Lens PC IOL in good position PC IOL in good position   Vitreous Vitreous syneresis, +cell/pigment in anterior vitreous, diffuse vitreous Hemorrhage Vitreous syneresis, +cell/pigment in anterior vitreous, diffuse vitreous Hemorrhage       Fundus Exam       Right Left   Disc obscured by pre-retinal heme +NVD and fibrosis   Macula hazy view; VH and preretinal heme obscuring hazy view, tractional fibrosis temporal macula   Periphery red reflex; grossly attached red reflex; grossly attached        Refraction    Manifest Refraction      Sphere Cylinder Axis Dist VA   Right -1.00 +0.50 172 20/400   Left -1.25 +0.50 097 20/800          IMAGING AND PROCEDURES  Imaging and Procedures for @TODAY @  OCT, Retina - OU - Both Eyes       Right Eye Quality was poor. Central Foveal Thickness: 417. Progression has no prior data. Findings include vitreous traction, intraretinal fluid, no SRF, preretinal fibrosis, epiretinal membrane.   Left Eye Quality was poor. Central Foveal Thickness: 607. Progression has no prior data. Findings include vitreous traction, preretinal fibrosis, intraretinal fluid, no SRF, macular pucker.   Notes *Images captured and stored on drive  Diagnosis / Impression:  Poor images due to vitreous opacities OU Scattered patches of preretinal fibrosis with vitreous traction OU   Clinical management:  See below  Abbreviations: NFP - Normal foveal profile. CME - cystoid macular edema. PED - pigment epithelial detachment. IRF - intraretinal fluid. SRF - subretinal fluid. EZ - ellipsoid zone. ERM - epiretinal membrane. ORA - outer retinal atrophy. ORT - outer retinal tubulation. SRHM - subretinal hyper-reflective material        Intravitreal Injection, Pharmacologic Agent - OD - Right Eye       Time Out 01/11/2019. 11:54 AM. Confirmed correct patient, procedure, site, and patient consented.   Anesthesia Topical anesthesia was used. Anesthetic medications included Lidocaine 2%, Proparacaine 0.5%.   Procedure Preparation included 5% betadine to ocular  surface, eyelid speculum. A supplied needle was used.   Injection:  1.25 mg Bevacizumab (AVASTIN) SOLN   NDC: 49449-675-91, Lot: [id number] , Expiration date:  03/02/2019   Route: Intravitreal, Site: Right Eye, Waste: 0 mL  Post-op Post injection exam found visual acuity of at least counting fingers. The patient tolerated the procedure well. There were no complications. The patient received written and verbal post procedure care education.        Intravitreal Injection, Pharmacologic Agent - OS - Left Eye       Time Out 01/11/2019. 11:54 AM. Confirmed correct patient, procedure, site, and patient consented.   Anesthesia Topical anesthesia was used. Anesthetic medications included Lidocaine 2%, Proparacaine 0.5%.   Procedure Preparation included 5% betadine to ocular surface, eyelid speculum. A 30 gauge needle was used.   Injection:  1.25 mg Bevacizumab (AVASTIN) SOLN   NDC: 19/09/2018, Lot: [id number] , Expiration date: 03/07/2019   Route: Intravitreal, Site: Left Eye, Waste: 0 mL  Post-op The patient tolerated the procedure well. There were no complications. The patient received written and verbal post procedure care education.                 ASSESSMENT/PLAN:    ICD-10-CM   1. 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 Vitreous hemorrhage of both eyes (HCC) H43.13 Intravitreal Injection, Pharmacologic Agent - OD - Right Eye    Intravitreal Injection, Pharmacologic Agent - OS - Left Eye    Bevacizumab (AVASTIN) SOLN 1.25 mg    Bevacizumab (AVASTIN) SOLN 1.25 mg  3. Retinal edema H35.81 OCT, Retina - OU - Both Eyes  4. Essential hypertension I10   5. Hypertensive retinopathy of both eyes H35.033   6. Pseudophakia of both eyes Z96.1     1-3. Proliferative diabetic retinopathy with DME and vitreous hemorrhage OU (OD > OS)  - pt with complex  medical history with hospitalization in Jan-Feb 2020 for bacteremia and abscess  - history of poor glycemic control for years  - The incidence, risk factors for progression, natural history and treatment options for diabetic retinopathy were discussed with patient.    - The need for close monitoring of blood glucose, blood pressure, and serum lipids, avoiding cigarette or any type of tobacco, and the need for long term follow up was also discussed with patient.  - exam shows diffuse vitreous and preretinal hemorrhage + scattered fibrosis OU   - OCT shows preretinal fibrosis w/ traction OU  - discussed findings, guarded prognosis, and treatment options including possibility of surgery  - recommend IVA OU #1 today, 06.02.20, for diffuse VH and preretinal heme  - pt wishes to proceed  - RBA of procedure discussed, questions answered  - informed consent obtained and signed  - see procedure note  - f/u in 1 wk for repeat DFE/OCT and possible PRP OS if VH clears enough  4,5. Hypertensive retinopathy OU - discussed importance of tight BP control - monitor  6. Pseudophakia OU  - s/p CE/IOL OU (Dr. 05-13-1997)  - post op drops per Dr. 19.02.20  - recommend adding NSAID QID OU -- samples of prolensa given   Ophthalmic Meds Ordered this visit:  Meds ordered this encounter  Medications  . Bevacizumab (  AVASTIN) SOLN 1.25 mg  . Bevacizumab (AVASTIN) SOLN 1.25 mg       Return in about 1 week (around 01/18/2019) for f/u PDR OU, DFE, OCT.  There are no Patient Instructions on file for this visit.   Explained the diagnoses, plan, and follow up with the patient and they expressed understanding.  Patient expressed understanding of the importance of proper follow up care.   This document serves as a record of services personally performed by Gardiner Sleeper, MD, PhD. It was created on their behalf by Ernest Mallick, OA, an ophthalmic assistant. The creation of this record is the provider's dictation  and/or activities during the visit.    Electronically signed by: Ernest Mallick, OA  06.02.2020 12:55 PM    Gardiner Sleeper, M.D., Ph.D. Diseases & Surgery of the Retina and Vitreous [facility]  I have reviewed the above documentation for accuracy and completeness, and I agree with the above. Gardiner Sleeper, M.D., Ph.D. 01/11/19 12:55 PM    Abbreviations: M myopia (nearsighted); A astigmatism; H hyperopia (farsighted); P presbyopia; Mrx spectacle prescription;  CTL contact lenses; OD right eye; OS left eye; OU both eyes  XT exotropia; ET esotropia; PEK punctate epithelial keratitis; PEE punctate epithelial erosions; DES dry eye syndrome; MGD meibomian gland dysfunction; ATs artificial tears; PFAT's preservative free artificial tears; [place] nuclear sclerotic cataract; PSC posterior subcapsular cataract; ERM epi-retinal membrane; PVD posterior vitreous detachment; RD retinal detachment; DM diabetes mellitus; DR diabetic retinopathy; NPDR non-proliferative diabetic retinopathy; PDR proliferative diabetic retinopathy; CSME clinically significant macular edema; DME diabetic macular edema; dbh dot blot hemorrhages; CWS cotton wool spot; POAG primary open angle glaucoma; C/D cup-to-disc ratio; HVF humphrey visual field; GVF goldmann visual field; OCT optical coherence tomography; IOP intraocular pressure; BRVO Branch retinal vein occlusion; CRVO central retinal vein occlusion; CRAO central retinal artery occlusion; BRAO branch retinal artery occlusion; RT retinal tear; SB scleral buckle; PPV pars plana vitrectomy; VH Vitreous hemorrhage; PRP panretinal laser photocoagulation; IVK intravitreal kenalog; VMT vitreomacular traction; MH Macular hole;  NVD neovascularization of the disc; NVE neovascularization elsewhere; AREDS age related eye disease study; ARMD age related macular degeneration; POAG primary open angle glaucoma; EBMD epithelial/anterior basement membrane dystrophy; ACIOL  anterior chamber intraocular lens; IOL intraocular lens; PCIOL posterior chamber intraocular lens; Phaco/IOL phacoemulsification with intraocular lens placement; [place] photorefractive keratectomy; LASIK laser assisted in situ keratomileusis; HTN hypertension; DM diabetes mellitus; COPD chronic obstructive pulmonary disease

## 2019-01-17 ENCOUNTER — Other Ambulatory Visit: Payer: Self-pay | Admitting: Physician Assistant

## 2019-01-18 ENCOUNTER — Other Ambulatory Visit: Payer: Self-pay

## 2019-01-18 NOTE — Progress Notes (Signed)
[facility] Note  01/19/2019     CHIEF COMPLAINT Patient presents for Retina Follow Up   HISTORY OF PRESENT ILLNESS: Randy Oneal is a 53 y.o. male who presents to the clinic today for:   HPI    Retina Follow Up    Patient presents with  Diabetic Retinopathy.  In both eyes.  This started 2 weeks ago.  Severity is mild.  Since onset it is gradually improving.  I, the attending physician,  performed the HPI with the patient and updated documentation appropriately.          Comments    F/U PDR OU.Patient states "I feel my left eye vision is better, but right  eye not as much". BS 130 this am , Bs WINL per patient. Pt ready for tx if indicted.        Last edited by Bernarda Caffey, MD on 01/19/2019  5:23 PM. (History)    pt states he feels like his left eye vision has gotten better, but his right eye is the same   Referring physician: [name], MD [place],  [address]  HISTORICAL INFORMATION:   Selected notes from the MEDICAL RECORD NUMBER Referred by Dr.Mark Gershon Crane for concern of decreased vision post cataract sx LEE:  Ocular Hx- PMH-   CURRENT MEDICATIONS: Current Outpatient Medications (Ophthalmic Drugs)  Medication Sig  . ketorolac (ACULAR) 0.5 % ophthalmic solution Place 2 drops into both eyes 3 (three) times daily.  . prednisoLONE acetate (PRED FORTE) 1 % ophthalmic suspension Place 2 drops into both eyes 3 (three) times daily.  Marland Kitchen ofloxacin (OCUFLOX) 0.3 % ophthalmic solution   . prednisoLONE acetate (PRED FORTE) 1 % ophthalmic suspension Place 1 drop into the left eye 4 (four) times daily for 7 days.   No current facility-administered medications for this visit.  (Ophthalmic Drugs)   Current Outpatient Medications (Other)  Medication Sig  . amLODipine (NORVASC) 5 MG tablet Take one tablet by mouth each day  . atorvastatin (LIPITOR) 10 MG tablet TAKE ONE TABLET BY MOUTH ONCE DAILY.  Marland Kitchen Continuous  Blood Gluc Sensor (FREESTYLE LIBRE SENSOR SYSTEM) MISC Use one sensor every 10 days.  . furosemide (LASIX) 40 MG tablet Take 1 tablet (40 mg total) by mouth daily as needed. Take for weight gain of 3-5 lbs  . glucose blood (BAYER CONTOUR NEXT TEST) test strip USE TO TEST FOUR TIMES DAILY.  Marland Kitchen insulin lispro (HUMALOG KWIKPEN) 100 UNIT/ML KwikPen Inject 0.06 mLs (6 Units total) into the skin daily with breakfast AND 0.07 mLs (7 Units total) 2 (two) times daily before lunch and supper.  . Insulin Pen Needle (BD PEN NEEDLE NANO 2ND GEN) 32G X 4 MM MISC Four times daily  . lisinopril (PRINIVIL,ZESTRIL) 10 MG tablet   . metoprolol tartrate (LOPRESSOR) 50 MG tablet Take one half tablet by mouth twice daily  . MICROLET LANCETS MISC USE FOUR TIMES A DAY AS DIRECTED.  Marland Kitchen nystatin (MYCOSTATIN/NYSTOP) powder Apply topically 2 (two) times daily. To groin and scrotum  . pantoprazole (PROTONIX) 40 MG tablet Take 1 tablet (40 mg total) by mouth daily.  . potassium chloride SA (K-DUR,KLOR-CON) 20 MEQ tablet Take 1 tablet (20 mEq total) by mouth daily as needed. Only take on days you use Lasix  . sildenafil (REVATIO) 20 MG tablet May take up to 5 before relations as directed  . TRESIBA FLEXTOUCH 100 UNIT/ML SOPN FlexTouch Pen Inject 0.19 mLs (19  Units total) into the skin at bedtime.   No current facility-administered medications for this visit.  (Other)      REVIEW OF SYSTEMS: ROS    Positive for: Endocrine, Eyes   Negative for: Constitutional, Gastrointestinal, Neurological, Skin, Genitourinary, Musculoskeletal, HENT, Cardiovascular, Respiratory, Psychiatric, Allergic/Imm, Heme/Lymph   Last edited by Zenovia Jordan, LPN on 08/16/2692  8:54 PM. (History)       ALLERGIES Allergies  Allergen Reactions  . Tramadol Nausea And Vomiting    Nausea and vomiting    PAST MEDICAL HISTORY Past Medical History:  Diagnosis Date  . Diabetes mellitus    Insulin dependent  . Hypertension   . Renal  insufficiency   . Sepsis due to Streptococcus, group B ([place]) 10/05/2018   Past Surgical History:  Procedure Laterality Date  . APPLICATION OF WOUND VAC Left 08/27/2018   Procedure: APPLICATION OF WOUND VAC, left neck;  Surgeon: Gaye Pollack, MD;  Location: [facility];  Service: Thoracic;  Laterality: Left;  . APPLICATION OF WOUND VAC Left 08/30/2018   Procedure: APPLICATION OF WOUND VAC;  Surgeon: Gaye Pollack, MD;  Location: [facility];  Service: Thoracic;  Laterality: Left;  . COLONOSCOPY  06/24/2011   Procedure: COLONOSCOPY;  Surgeon: [name], MD;  [facility];  Service: Endoscopy;  Laterality: N/A;  8:30 AM  . STERNAL WOUND DEBRIDEMENT Left 08/27/2018   Procedure: Incision and DEBRIDEMENT Left Chest, Neck and Mediastinum;  Surgeon: Gaye Pollack, MD;  Location: [facility];  Service: Thoracic;  Laterality: Left;  . STERNAL WOUND DEBRIDEMENT Left 08/30/2018   Procedure: WOUND VAC CHANGE, LEFT CHEST AND NECK, POSSIBLE DEBRIDEMENT;  Surgeon: Gaye Pollack, MD;  Location: [facility] OR;  Service: Thoracic;  Laterality: Left;    FAMILY HISTORY Family History  Problem Relation Age of Onset  . Hypertension Mother   . Colon cancer Neg Hx     SOCIAL HISTORY Social History   Tobacco Use  . Smoking status: Never Smoker  . Smokeless tobacco: Never Used  Substance Use Topics  . Alcohol use: No  . Drug use: No         OPHTHALMIC EXAM:  Base Eye Exam    Visual Acuity (Snellen - Linear)      Right Left   Dist [place] CF at 3' 20/80 +1   Dist ph [place]  NI       Tonometry (Tonopen, 2:51 PM)      Right Left   Pressure 20 14       Pupils      Dark Light Shape React APD   Right 3 2 Round Brisk None   Left 3 2 Round Brisk None       Visual Fields (Counting fingers)      Left Right    Full Full       Extraocular Movement      Right Left    Full, Ortho Full, Ortho       Neuro/Psych    Oriented x3:  Yes   Mood/Affect:  Normal       Dilation    Both eyes:  1.0% Mydriacyl,  2.5% Phenylephrine @ 2:48 PM        Slit Lamp and Fundus Exam    Slit Lamp Exam      Right Left   Lids/Lashes mild Meibomian gland dysfunction mild Meibomian gland dysfunction   Conjunctiva/Sclera White and quiet White and quiet   Cornea mild Arcus, 2+ Descemet's folds, Well healed  temporal cataract wounds mild Arcus, 2+ Descemet's folds, Well healed temporal cataract wounds   Anterior Chamber 4+pigment Deep, 3-4+pigment   Iris slightly irregular, dilated Round and dilated   Lens PC IOL in good position PC IOL in good position   Vitreous Vitreous syneresis, +cell/pigment in anterior vitreous, diffuse vitreous Hemorrhage - slightly improved, vitreous condensations/traction posteriorly Vitreous syneresis, +cell/pigment in anterior vitreous, diffuse vitreous Hemorrhage       Fundus Exam      Right Left   Disc obscured by pre-retinal heme +NVD and fibrosis - mild early regression   Macula hazy view; VH and preretinal heme obscuring, TRD Blunted foveal reflex, tractional fibrosis temporal macula   Periphery red reflex; grossly attached red reflex; grossly attached          IMAGING AND PROCEDURES  Imaging and Procedures for @TODAY @  OCT, Retina - OU - Both Eyes       Right Eye Progression has no prior data. Findings include preretinal fibrosis.   Left Eye Quality was poor. Central Foveal Thickness: 422. Progression has improved. Findings include vitreous traction, preretinal fibrosis, intraretinal fluid, no SRF, macular pucker, abnormal foveal contour, intraretinal hyper-reflective material, epiretinal membrane (Mild interval improvement in vitreous opacities).   Notes *Images captured and stored on drive  Diagnosis / Impression:  No image obtained today OD Mild interval improvement in vitreous opacities OS Scattered patches of preretinal fibrosis with vitreous traction OS   Clinical management:  See below  Abbreviations: NFP - Normal foveal profile. CME - cystoid macular  edema. PED - pigment epithelial detachment. IRF - intraretinal fluid. SRF - subretinal fluid. EZ - ellipsoid zone. ERM - epiretinal membrane. ORA - outer retinal atrophy. ORT - outer retinal tubulation. SRHM - subretinal hyper-reflective material        Panretinal Photocoagulation - OS - Left Eye       LASER PROCEDURE NOTE  Diagnosis:   Proliferative Diabetic Retinopathy, LEFT EYE  Procedure:  Pan-retinal photocoagulation using slit lamp laser, LEFT EYE  Anesthesia:  Topical  Surgeon: Bernarda Caffey, MD, PhD   Informed consent obtained, operative eye marked, and time out performed prior to initiation of laser.   Lumenis [id number] slit lamp laser Pattern: 3x3 square Power: 300 mW Duration: 30 msec  Spot size: 200 microns  # spots: [phone] spots  Complications: None.  Notes: significant vitreous heme obscuring view and preventing laser up take inferiorly and scattered focal areas  RTC: 2-3 wks  Patient tolerated the procedure well and received written and verbal post-procedure care information/education.                  ASSESSMENT/PLAN:    ICD-10-CM   1. Proliferative diabetic retinopathy of both eyes with macular edema associated with type 2 diabetes mellitus ([place]) G31.5176 Panretinal Photocoagulation - OS - Left Eye  2. Vitreous hemorrhage of both eyes (HCC) H43.13 Panretinal Photocoagulation - OS - Left Eye  3. Retinal edema H35.81 OCT, Retina - OU - Both Eyes  4. Essential hypertension I10   5. Hypertensive retinopathy of both eyes H35.033   6. Pseudophakia of both eyes Z96.1     1-3. Proliferative diabetic retinopathy with DME and vitreous hemorrhage OU (OD > OS)  - pt with complex medical history with hospitalization in Jan-Feb 2020 for bacteremia and abscess  - history of poor glycemic control for years  - The incidence, risk factors for progression, natural history and treatment options for diabetic retinopathy were discussed with patient.    -  The need  for close monitoring of blood glucose, blood pressure, and serum lipids, avoiding cigarette or any type of tobacco, and the need for long term follow up was also discussed with patient.  - s/p IVA OU #1 (06.02.20)   - exam shows diffuse vitreous and preretinal hemorrhage + scattered fibrosis OU -- mild improvement post injection  - OCT shows preretinal fibrosis w/ traction OU  - discussed findings, guarded prognosis, and treatment options including possibility of surgery  - recommend PRP OS today, 06.10.20  - pt wishes to proceed  - RBA of procedure discussed, questions answered  - informed consent obtained and signed  - see procedure note  - f/u 2-3 weeks - DFE/OCT  4,5. Hypertensive retinopathy OU  - discussed importance of tight BP control  - monitor  6. Pseudophakia OU  - s/p CE/IOL OU (Dr. Gershon Crane)  - post op drops per Dr. Gershon Crane  - recommend adding NSAID QID OU -- samples of prolensa given   Ophthalmic Meds Ordered this visit:  Meds ordered this encounter  Medications  . prednisoLONE acetate (PRED FORTE) 1 % ophthalmic suspension    Sig: Place 1 drop into the left eye 4 (four) times daily for 7 days.    Dispense:  10 mL    Refill:  0       Return in about 2 weeks (around 02/02/2019) for s/p PRP OS.  There are no Patient Instructions on file for this visit.   Explained the diagnoses, plan, and follow up with the patient and they expressed understanding.  Patient expressed understanding of the importance of proper follow up care.   This document serves as a record of services personally performed by Gardiner Sleeper, MD, PhD. It was created on their behalf by Ernest Mallick, OA, an ophthalmic assistant. The creation of this record is the provider's dictation and/or activities during the visit.    Electronically signed by: Ernest Mallick, OA  06.09.2020 5:34 PM    Gardiner Sleeper, M.D., Ph.D. Diseases & Surgery of the Retina and Vitreous [facility]  I have reviewed the above documentation for accuracy and completeness, and I agree with the above. Gardiner Sleeper, M.D., Ph.D. 01/19/19 5:34 PM    Abbreviations: M myopia (nearsighted); A astigmatism; H hyperopia (farsighted); P presbyopia; Mrx spectacle prescription;  CTL contact lenses; OD right eye; OS left eye; OU both eyes  XT exotropia; ET esotropia; PEK punctate epithelial keratitis; PEE punctate epithelial erosions; DES dry eye syndrome; MGD meibomian gland dysfunction; ATs artificial tears; PFAT's preservative free artificial tears; [place] nuclear sclerotic cataract; PSC posterior subcapsular cataract; ERM epi-retinal membrane; PVD posterior vitreous detachment; RD retinal detachment; DM diabetes mellitus; DR diabetic retinopathy; NPDR non-proliferative diabetic retinopathy; PDR proliferative diabetic retinopathy; CSME clinically significant macular edema; DME diabetic macular edema; dbh dot blot hemorrhages; CWS cotton wool spot; POAG primary open angle glaucoma; C/D cup-to-disc ratio; HVF humphrey visual field; GVF goldmann visual field; OCT optical coherence tomography; IOP intraocular pressure; BRVO Branch retinal vein occlusion; CRVO central retinal vein occlusion; CRAO central retinal artery occlusion; BRAO branch retinal artery occlusion; RT retinal tear; SB scleral buckle; PPV pars plana vitrectomy; VH Vitreous hemorrhage; PRP panretinal laser photocoagulation; IVK intravitreal kenalog; VMT vitreomacular traction; MH Macular hole;  NVD neovascularization of the disc; NVE neovascularization elsewhere; AREDS age related eye disease study; ARMD age related macular degeneration; POAG primary open angle glaucoma; EBMD epithelial/anterior basement membrane dystrophy; ACIOL anterior chamber intraocular lens; IOL intraocular  lens; PCIOL posterior chamber intraocular lens; Phaco/IOL phacoemulsification with intraocular lens placement; [place] photorefractive keratectomy; LASIK laser assisted in situ  keratomileusis; HTN hypertension; DM diabetes mellitus; COPD chronic obstructive pulmonary disease

## 2019-01-19 ENCOUNTER — Ambulatory Visit: Payer: Managed Care, Other (non HMO) | Admitting: Surgery

## 2019-01-19 ENCOUNTER — Encounter (INDEPENDENT_AMBULATORY_CARE_PROVIDER_SITE_OTHER): Payer: Self-pay | Admitting: Ophthalmology

## 2019-01-19 ENCOUNTER — Other Ambulatory Visit: Payer: Self-pay | Admitting: Physician Assistant

## 2019-01-19 ENCOUNTER — Ambulatory Visit (INDEPENDENT_AMBULATORY_CARE_PROVIDER_SITE_OTHER): Payer: Managed Care, Other (non HMO) | Admitting: Ophthalmology

## 2019-01-19 ENCOUNTER — Encounter: Payer: Self-pay | Admitting: Surgery

## 2019-01-19 VITALS — BP 164/94 | HR 79 | Temp 97.9°F | Resp 16 | Ht 70.0 in | Wt 171.8 lb

## 2019-01-19 DIAGNOSIS — I10 Essential (primary) hypertension: Secondary | ICD-10-CM | POA: Diagnosis not present

## 2019-01-19 DIAGNOSIS — E113513 Type 2 diabetes mellitus with proliferative diabetic retinopathy with macular edema, bilateral: Secondary | ICD-10-CM

## 2019-01-19 DIAGNOSIS — Z09 Encounter for follow-up examination after completed treatment for conditions other than malignant neoplasm: Secondary | ICD-10-CM | POA: Diagnosis not present

## 2019-01-19 DIAGNOSIS — H3581 Retinal edema: Secondary | ICD-10-CM | POA: Diagnosis not present

## 2019-01-19 DIAGNOSIS — Z961 Presence of intraocular lens: Secondary | ICD-10-CM

## 2019-01-19 DIAGNOSIS — H4313 Vitreous hemorrhage, bilateral: Secondary | ICD-10-CM

## 2019-01-19 DIAGNOSIS — M009 Pyogenic arthritis, unspecified: Secondary | ICD-10-CM

## 2019-01-19 DIAGNOSIS — H35033 Hypertensive retinopathy, bilateral: Secondary | ICD-10-CM

## 2019-01-19 MED ORDER — PREDNISOLONE ACETATE 1 % OP SUSP: 1.0000 [drp] | Freq: Four times a day (QID) | OPHTHALMIC | 0 refills | Status: AC

## 2019-01-19 NOTE — Progress Notes (Signed)
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 He underwent surgical drainage on 08/30/2018 followed by a complete course of intravenous antibiotics per infectious disease and treatment with VACwound dressings.  When I last saw him on 12/10/2018 there is only a small bit of granulation tissue remaining at the open wound site.  He continues to do well overall.  He has had no fever or chills.  He has been keeping a dressing over the wound but has not noticed any significant drainage.  Current Outpatient Medications  Medication Sig Dispense Refill  . amLODipine (NORVASC) 5 MG tablet Take one tablet by mouth each day 90 tablet 1  . atorvastatin (LIPITOR) 10 MG tablet TAKE ONE TABLET BY MOUTH ONCE DAILY. 30 tablet 0  . Continuous Blood Gluc Sensor (FREESTYLE LIBRE SENSOR SYSTEM) MISC Use one sensor every 10 days. 3 each 2  . furosemide (LASIX) 40 MG tablet Take 1 tablet (40 mg total) by mouth daily as needed. Take for weight gain of 3-5 lbs 30 tablet 3  . glucose blood (BAYER CONTOUR NEXT TEST) test strip USE TO TEST FOUR TIMES DAILY. 150 each 2  . insulin lispro (HUMALOG KWIKPEN) 100 UNIT/ML KwikPen Inject 0.06 mLs (6 Units total) into the skin daily with breakfast AND 0.07 mLs (7 Units total) 2 (two) times daily before lunch and supper. 15 mL 11  . Insulin Pen Needle (BD PEN NEEDLE NANO 2ND GEN) 32G X 4 MM MISC Four times daily 150 each 11  . ketorolac (ACULAR) 0.5 % ophthalmic solution Place 2 drops into both eyes 3 (three) times daily.    Marland Kitchen lisinopril (PRINIVIL,ZESTRIL) 10 MG tablet     . metoprolol tartrate (LOPRESSOR) 50 MG tablet Take one half tablet by mouth twice daily 60 tablet 3  . MICROLET LANCETS MISC USE FOUR TIMES A DAY AS DIRECTED. 100 each 0  . nystatin (MYCOSTATIN/NYSTOP) powder Apply topically 2 (two) times daily. To groin and scrotum 15 g 0  . pantoprazole  (PROTONIX) 40 MG tablet Take 1 tablet (40 mg total) by mouth daily. 30 tablet 4  . prednisoLONE acetate (PRED FORTE) 1 % ophthalmic suspension Place 2 drops into both eyes 3 (three) times daily.    . sildenafil (REVATIO) 20 MG tablet May take up to 5 before relations as directed 50 tablet 4  . TRESIBA FLEXTOUCH 100 UNIT/ML SOPN FlexTouch Pen Inject 0.19 mLs (19 Units total) into the skin at bedtime. 15 mL 1  . potassium chloride SA (K-DUR,KLOR-CON) 20 MEQ tablet Take 1 tablet (20 mEq total) by mouth daily as needed. Only take on days you use Lasix (Patient not taking: Reported on 01/19/2019) 30 tablet 0   No current facility-administered medications for this visit.      Physical Exam: BP (!) 164/94 (BP Location: Right Arm, Patient Position: Sitting, Cuff Size: Normal)   Pulse 79   Temp 97.9 F (36.6 C) (Skin)   Resp 16   Ht 5\' 10"  (1.778 m)   Wt 171 lb 12.8 oz (77.9 kg)   SpO2 96% Comment: ON RA  BMI 24.65 kg/m  He looks well.  The small draining sinus has completely closed.  There is no erythema or tenderness.  The medial head of the left clavicle is still very protuberant because it was displaced anteriorly after the sternoclavicular joint was debrided.  He is also had significant heterotopic bone formation  in the area.  There is no sign of active infection.  Diagnostic Tests:  None today  Impression:  The draining sinus has completely closed at this time and there is no remaining granulation tissue present.  There is no sign of active infection although I explained to him that I would continue to be vigilant he has sometimes these draining sinuses will close and then reopen at a later date.  I do not think any further intervention is required at this time and he will continue to observe this at home.  I told him that as far as I was concerned that he could return to work but he just recently had eye surgery and we will plan to return to work when his ophthalmologist to release him.   Plan:  He will continue to follow-up with Dr. Wolfgang Phoenix and will contact me if he develops any local signs of infection around the left sternoclavicular region which I reviewed with him.  I spent 10 minutes performing this established patient evaluation and > 50% of this time was spent face to face counseling and coordinating the care of this patient's left sternoclavicular joint infection.    Gaye Pollack, MD [facility] [phone]

## 2019-01-21 ENCOUNTER — Ambulatory Visit (HOSPITAL_COMMUNITY): Payer: Managed Care, Other (non HMO) | Attending: Family Medicine

## 2019-01-21 ENCOUNTER — Other Ambulatory Visit: Payer: Self-pay

## 2019-01-21 DIAGNOSIS — M6281 Muscle weakness (generalized): Secondary | ICD-10-CM | POA: Diagnosis not present

## 2019-01-21 DIAGNOSIS — R262 Difficulty in walking, not elsewhere classified: Secondary | ICD-10-CM | POA: Diagnosis present

## 2019-01-21 NOTE — Therapy (Signed)
[place], [place], [address] Phone: [phone]   Fax:  [phone]  Physical Therapy Evaluation  Patient Details  Name: Randy [facility] MRN: [id number] Date of Birth: August 22, 1965 Referring Provider (PT): [facility] Karvonen, MD   Encounter Date: 01/21/2019  PT End of Session - 01/21/19 1542    Visit Number  1    Number of Visits  24    Date for PT Re-Evaluation  03/11/19    Authorization Type  [employer] Managed (20 visit limit per calander year, 0 used at eval, no auth required)    Authorization Time Period  01/21/2019-03/11/19    Authorization - Visit Number  1    Authorization - Number of Visits  20    PT Start Time  1426   pt late   PT Stop Time  1517    PT Time Calculation (min)  51 min    Equipment Utilized During Treatment  Gait belt    Activity Tolerance  Patient tolerated treatment well    Behavior During Therapy  Randy [facility] for tasks assessed/performed       Past Medical History:  Diagnosis Date  . Diabetes mellitus    Insulin dependent  . Hypertension   . Renal insufficiency   . Sepsis due to Streptococcus, group B ([facility]) 10/05/2018    Past Surgical History:  Procedure Laterality Date  . APPLICATION OF WOUND VAC Left 08/27/2018   Procedure: APPLICATION OF WOUND VAC, left neck;  Surgeon: [facility] Pollack, MD;  Location: [facility];  Service: Thoracic;  Laterality: Left;  . APPLICATION OF WOUND VAC Left 08/30/2018   Procedure: APPLICATION OF WOUND VAC;  Surgeon: [facility] Pollack, MD;  Location: [facility];  Service: Thoracic;  Laterality: Left;  . COLONOSCOPY  06/24/2011   Procedure: COLONOSCOPY;  Surgeon: [facility] Peng, MD;  [facility];  Service: Endoscopy;  Laterality: N/A;  8:30 AM  . STERNAL WOUND DEBRIDEMENT Left 08/27/2018   Procedure: Incision and DEBRIDEMENT Left Chest, Neck and Mediastinum;  Surgeon: [facility] Pollack, MD;  Location: [facility] OR;  Service: Thoracic;  Laterality: Left;  . STERNAL WOUND  DEBRIDEMENT Left 08/30/2018   Procedure: WOUND VAC CHANGE, LEFT CHEST AND NECK, POSSIBLE DEBRIDEMENT;  Surgeon: [facility] Pollack, MD;  Location: [facility];  Service: Thoracic;  Laterality: Left;    There were no vitals filed for this visit.    Subjective Assessment - 01/21/19 1436  Subjective [name] reports he developed "flu-like" symptoms in Randy beginning of January, ~08/12/18 and had blood work done with his PCP Dr. Wolfgang [facility]. He was admitted to [facility] on 08/17/18 and spent ~ 3-4 days there before being transferred to [facility]. He states he had 2 surgeries while in Randy hospital for an infection in his Lt collarbone. He states he did not get much therapy while in Randy hospital and became very weak. After returning home at Randy end of January he states he tried riding his stationary bike about 30 minutes every other day and lifting ~ 9lbs weights but stopped ~ 1 month ago because he didn't want to injure himself and now rides Randy bike for ~ 5 minutes every other day. He had developed low back pain and posterior thigh pain since returning home and describes it as an ache and dull soreness. He feels he has lost a lot of muscle during his hospitalization and time at home recovering from Randy surgery. 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 He is eager to get back to work after improving his strength.  Pertinent History [name] presented to [facility] on 08/17/18 with signs of sepsis. He was transferred to [facility] where he underwent surgery on 08/27/18 for Lt sternoclavicular infection with abscess to Lt neck and mediastinum. Randy wound was debrided and wound vac changed again on 08/30/18. Pt was discharged on 09/05/18 with home care to manage wound vac and IV line. On 01/19/19 patient had his final follow up with his surgeon, Randy Pollack, MD, and was discharged as wound was fully healed and no signs of infection  were present any longer. He is to follow up with his PCP no, Randy Lange, MD.  Limitations Walking;Standing;Lifting  Pain Assessment  Currently in Pain? No/denies     [facility] PT Assessment - 01/21/19 0001      Assessment   Medical Diagnosis  Leg Pain (Weakness)    Referring Provider (PT)  [place] Karvonen, MD    Onset Date/Surgical Date  09/29/18   approximate   Hand Dominance  Right    Prior Therapy  some in [facility] hospital from [place]      Precautions   Precautions  Fall      Restrictions   Weight Bearing Restrictions  No    Other Position/Activity Restrictions  pt reports he has no restrictions related to Lt sternocleidomastoid surgery      Balance Screen   Has Randy patient fallen in Randy past 6 months  Yes    How many times?  2   pt fell 2x on Randy day he returned home from Randy hospital   Has Randy patient had a decrease in activity level because of a fear of falling?   Yes    Is Randy patient reluctant to leave their home because of a fear of falling?   Yes      [place] residence    Living Arrangements  Spouse/significant other;Children    Type of [place] to enter    Entrance Stairs-Number of Steps  3   4 in back   [place]  One level    [place] - 2 wheels;Cane - single point;Shower seat    Additional Comments  helping out with laundry and dishwashign now. Randy cooking it depends denies restriciton due to leg.       Prior Function   Level of Independence  Independent      Cognition   Overall Cognitive Status  Within Functional Limits for tasks assessed      Observation/Other Assessments   Focus on Therapeutic Outcomes (FOTO)   NA      Functional Tests   Functional tests  Single leg stance      Single Leg Stance   Comments  pt unable to maintain SLS on Bil LE's (0")      Posture/Postural Control   Posture/Postural Control  Postural limitations     Postural Limitations  Forward head;Decreased lumbar lordosis      ROM / Strength   AROM / PROM / Strength  AROM;Strength      AROM   AROM Assessment Site  Lumbar    Lumbar Flexion  50   pt reports tightness in posterior thighs  and low back   Lumbar Extension  WNL    Lumbar - Right Side Bend  WNL    Lumbar - Left Side Bend  WNL      Strength   Strength Assessment Site  Hip;Knee;Ankle    Right Hip Flexion  4/5    Right Hip Extension  4/5    Right Hip ABduction  3+/5    Left Hip Flexion  4/5    Left Hip Extension  4/5    Left Hip ABduction  3+/5    Right/Left Knee  Right;Left    Right Knee Flexion  3+/5    Right Knee Extension  4/5    Left Knee Flexion  3+/5    Left Knee Extension  4/5    Right Ankle Dorsiflexion  4/5    Left Ankle Dorsiflexion  4/5      Flexibility   Soft Tissue Assessment /Muscle Length  yes    Hamstrings  Rt: 90/130; Lt: 90/134      Ambulation/Gait   Ambulation/Gait  Yes    Ambulation/Gait Assistance  4: Min guard    Ambulation Distance (Feet)  262 Feet    Assistive device  Straight cane    Gait Pattern  Step-through pattern;Decreased arm swing - left;Decreased stride length;Decreased hip/knee flexion - right;Decreased hip/knee flexion - left;Trendelenburg;Narrow base of support   pt staggering side to side    Ambulation Surface  Level;Indoor    Gait velocity  0.66 m/s    Stairs  Yes    Stairs Assistance  4: Min guard;4: Min assist    Stairs Assistance Details (indicate cue type and reason)  min assist to steady while ascending with no hand rails    Stair Management Technique  No rails;One rail Left;With cane;Forwards;Alternating pattern    Number of Stairs  4    Height of Stairs  6       Objective measurements completed on examination: See above findings.     [place] Adult PT Treatment/Exercise - 01/21/19 0001      Exercises   Exercises  Knee/Hip      Knee/Hip Exercises: Stretches   Active Hamstring Stretch  Both;1 rep;30  seconds;Limitations    Active Hamstring Stretch Limitations  seated      Knee/Hip Exercises: Sidelying   Clams  1x 10 reps bil LE with red TB and 5 sec holds       PT Education - 01/21/19 1754    Education Details  Educated on exam findings and assessment. Educated on appropriate POC and HEP.    Person(s) Educated  Patient    Methods  Explanation;Handout    Comprehension  Returned demonstration;Verbalized understanding       PT Short Term Goals - 01/21/19 1549      PT SHORT TERM GOAL #1   Title  Patient will be independent with HEP, updated PRN, to improve functional strength to improve mobility and return to PLOF.    Baseline  initiated HEP at evaluation    Time  2    Period  Weeks    Status  New    Target Date  02/04/19      PT SHORT TERM GOAL #2   Title  Patient will improve LE strength by 1 grade with MMT to indicate significant improvement in strength.    Baseline  significant weakness in bil LE's especially to gluteus medius and hamstrings    Time  4    Period  Weeks    Status  New    Target Date  02/11/19      PT SHORT TERM GOAL #3   Title  Patient will perform SLS for 10 seconds on bil LE to indicate improved glut med strangth and improved balance for stair mobility and gait.    Time  4    Period  Weeks    Status  New    Target Date  02/11/19      PT SHORT TERM GOAL #4   Title  Patient will ambulate at 0.8 m/s or faster with LRAD during 2 MWT to indicate improved endurance and gait.    Time  4    Period  Weeks    Status  New    Target Date  02/11/19        PT Long Term Goals - 01/21/19 1559      PT LONG TERM GOAL #1   Title  Patient will ambulate at 0.8 m/s or faster with LRAD during 6 MWT to indicate improved endurance and gait.    Time  8    Period  Weeks    Status  New    Target Date  03/11/19      PT LONG TERM GOAL #2   Title  Patient will ascend/descend 4 6" stairs with no hand rail at independent level with no overt LOB.    Time  8     Period  Weeks    Status  New    Target Date  03/11/19      PT LONG TERM GOAL #3   Title  Patient will report no low back or posterior leg pain for 2 week period that is above 3/10 indicating improved activity tolerance.    Time  8    Period  Weeks    Status  New    Target Date  03/11/19         Plan - 01/21/19 1544    Clinical Impression Statement Mr. Stefanski presents for outpatient physical therapy evaluation for low back and leg pain. He has significant medical history for extended hospital stay related to sepsis and sternoclavicular joint infection w/ abscess requiring surgical debridement. He was discharged from [facility] hospital on 09/05/18 and had home care for wound vac management. At his latest follow up he was cleared with no restrictions related to Lt UE as wound and surgical site were fully healed. His leg pain and back pain was not aggravated by Lumbar ROM with exception of forward flexion and he was noted to have impaired hamstring flexibility bil. He strength testing revealed significant weakness at his hips and throughout Randy LE. Based on subjective and objective findings Randy patient is likely presenting with musculoskeletal pain related to weakness from his hospital stay. He will benefit from skilled physical therapy to address impairments and improve strength to improve independence with functional mobility.    Personal Factors and Comorbidities  Fitness;Comorbidity 2    Comorbidities  DM, HTN, recent sepsis with prolonged hospitalization and weight loss    Examination-Activity Limitations  Bathing;Stairs;Stand;Lift;Locomotion Level    Examination-Participation Restrictions  Laundry;Yard Work;Driving;[place] Activity;Cleaning    Stability/Clinical Decision Making  Stable/Uncomplicated    Clinical Decision Making  Low    Rehab Potential  Good    PT Frequency  3x / week    PT Duration  8 weeks    PT Treatment/Interventions  ADLs/Self Care Home Management;Aquatic  Therapy;Cryotherapy;Electrical Stimulation;Moist Heat;Gait training;DME Instruction;Stair training;Functional mobility training;Therapeutic activities;Therapeutic exercise;Balance training;Neuromuscular re-education;Patient/family education;Manual techniques;Passive range of  motion;Dry needling;Taping    PT Next Visit Plan Review eval and goals. Initiate isolated hip and LE strengthening as well as functional. Perform balance training for SLS and tandem.   PT Home Exercise Plan  Eval: clamshell with band, seated hamstring stretch    Consulted and Agree with Plan of Care  Patient       Patient will benefit from skilled therapeutic intervention in order to improve Randy following deficits and impairments:  Abnormal gait, Decreased activity tolerance, Decreased balance, Decreased mobility, Difficulty walking, Decreased endurance, Decreased coordination, Decreased strength, Pain, Postural dysfunction, Impaired flexibility  Visit Diagnosis: Muscle weakness (generalized)   Difficulty in walking, not elsewhere classified      Problem List Patient Active Problem List   Diagnosis Date Noted  . Type 2 diabetes mellitus with hyperglycemia, with long-term current use of insulin ([place]) 10/20/2018  . Sepsis due to Streptococcus, group B ([place]) 10/05/2018  . Dietary counseling and surveillance 09/15/2018  . Non compliance with medical treatment 09/15/2018  . Open wound of right great toe   . Septic arthritis of left sternoclavicular joint ([place])   . Mediastinal abscess ([place]) 08/27/2018  . Cellulitis of great toe, right   . Diabetic hyperosmolar non-ketotic state ([place]) 08/17/2018  . Hyperglycemia 08/17/2018  . AKI (acute kidney injury) ([place]) 08/16/2018  . Hyponatremia 08/16/2018  . Hyperlipidemia associated with type 2 diabetes mellitus ([place]) 03/04/2017  . Erectile dysfunction 03/04/2017  . Personal history of noncompliance with medical treatment, presenting hazards to health 11/22/2015  . HTN  (hypertension), benign 01/30/2014  . Loss of weight 01/30/2014  . Type 2 diabetes mellitus not at goal [facility]) 03/30/2013    [facility], PT, DPT, [facility] Physical Therapist with [facility]  01/21/2019 5:56 PM    [place] [address] [place], [place], [address] Phone: [phone]   Fax:  [phone]  Name: Rayquan Amrhein [facility] MRN: [id number] Date of Birth: Jan 20, 1966

## 2019-01-21 NOTE — Patient Instructions (Signed)
Clamshell with Resistance reps: 10 sets: 3 hold: 5 seconds daily: 2  weekly: 7      Exercise image step 1   Exercise image step 2  Setup  Begin by lying on your side with your knees bent 90 degrees, hips and shoulders stacked, and a resistance loop secured around your legs.  Movement  Raise your top knee away from the bottom one, then slowly return to the starting position.  Tip  Make sure not to roll your hips forward or backward during the exercise. Seated Hamstring Stretch reps: 3 sets: 1 hold: 30 seconds daily: 2  weekly: 7      Exercise image step 1   Exercise image step 2  Setup  Begin sitting upright with one leg straight forward and your heel resting on the ground. Movement  Bend your trunk forward, hinging at your hips until you feel a stretch in the back of your leg. Hold this position.  Tip  Make sure to keep your knee straight during the stretch and do not let your back arch or slump.

## 2019-01-24 ENCOUNTER — Other Ambulatory Visit: Payer: Self-pay

## 2019-01-24 ENCOUNTER — Ambulatory Visit (HOSPITAL_COMMUNITY): Payer: Managed Care, Other (non HMO) | Admitting: Physical Therapy

## 2019-01-24 DIAGNOSIS — M6281 Muscle weakness (generalized): Secondary | ICD-10-CM

## 2019-01-24 DIAGNOSIS — R262 Difficulty in walking, not elsewhere classified: Secondary | ICD-10-CM

## 2019-01-24 NOTE — Therapy (Signed)
[place], [place], [address] Phone: [phone]   Fax:  [phone]  Physical Therapy Treatment  Patient Details  Name: Randy [facility] MRN: [id number] Date of Birth: 07-05-1966 Referring Provider (PT): [place] Karvonen, MD   Encounter Date: 01/24/2019  PT End of Session - 01/24/19 1617    Visit Number  2    Number of Visits  24    Date for PT Re-Evaluation  03/11/19    Authorization Type  [employer] Managed (20 visit limit per calander year, 0 used at eval, no auth required)    Authorization Time Period  01/21/2019-03/11/19    Authorization - Visit Number  2    Authorization - Number of Visits  20    PT Start Time  1345    PT Stop Time  1428    PT Time Calculation (min)  43 min    Equipment Utilized During Treatment  Gait belt    Activity Tolerance  Patient tolerated treatment well    Behavior During Therapy  [facility] for tasks assessed/performed       Past Medical History:  Diagnosis Date  . Diabetes mellitus    Insulin dependent  . Hypertension   . Renal insufficiency   . Sepsis due to Streptococcus, group B ([place]) 10/05/2018    Past Surgical History:  Procedure Laterality Date  . APPLICATION OF WOUND VAC Left 08/27/2018   Procedure: APPLICATION OF WOUND VAC, left neck;  Surgeon: Gaye Pollack, MD;  Location: [facility];  Service: Thoracic;  Laterality: Left;  . APPLICATION OF WOUND VAC Left 08/30/2018   Procedure: APPLICATION OF WOUND VAC;  Surgeon: Gaye Pollack, MD;  Location: [place];  Service: Thoracic;  Laterality: Left;  . COLONOSCOPY  06/24/2011   Procedure: COLONOSCOPY;  Surgeon: [name], MD;  [facility];  Service: Endoscopy;  Laterality: N/A;  8:30 AM  . STERNAL WOUND DEBRIDEMENT Left 08/27/2018   Procedure: Incision and DEBRIDEMENT Left Chest, Neck and Mediastinum;  Surgeon: Gaye Pollack, MD;  Location: [facility] OR;  Service: Thoracic;  Laterality: Left;  . STERNAL WOUND DEBRIDEMENT  Left 08/30/2018   Procedure: WOUND VAC CHANGE, LEFT CHEST AND NECK, POSSIBLE DEBRIDEMENT;  Surgeon: Gaye Pollack, MD;  Location: [facility];  Service: Thoracic;  Laterality: Left;    There were no vitals filed for this visit.  Subjective Assessment - 01/24/19 1401    Subjective  pt arrived 13 minutes late.  States he was sore from the newly added exercises, mostly in his quads (pointing to this area).  States he has been walking some as well.    Currently in Pain?  No/denies                       [facility] Adult PT Treatment/Exercise - 01/24/19 0001      Ambulation/Gait   Ambulation/Gait  Yes    Ambulation/Gait Assistance  4: Min guard    Ambulation Distance (Feet)  200 Feet    Assistive device  Straight cane    Gait Pattern  Step-through pattern;Decreased arm swing - left;Decreased stride length;Decreased hip/knee flexion - right;Decreased hip/knee flexion - left;Trendelenburg;Narrow base of support    Ambulation Surface  Level;Indoor      Knee/Hip Exercises: Standing   Heel Raises  Both;10 reps;Limitations    Heel Raises Limitations  toeraises 10 reps    Hip Flexion  10 reps;Both    Hip Flexion Limitations  2" holds, completing slowly and controlled alternating    SLS  bil max of 2" without UE assist X5 trials    Other Standing Knee Exercises  tandem balance, only able to hold 3" without UE either LE leading      Knee/Hip Exercises: Seated   Other Seated Knee/Hip Exercises  toeraises 15 reps 3" hlds    Sit to Sand  10 reps;without UE support      Knee/Hip Exercises: Supine   Bridges  1 set;10 reps    Straight Leg Raises  Right;Left;1 set;10 reps      Knee/Hip Exercises: Sidelying   Clams  HEP review: x 10 reps bil LE with red TB and 5 sec holds             PT Education - 01/24/19 1617    Education Details  reviewed HEP and goals per POC moving forward    Person(s) Educated  Patient    Methods  Explanation;Demonstration;Tactile cues;Verbal cues     Comprehension  Verbalized understanding;Returned demonstration;Verbal cues required;Tactile cues required       PT Short Term Goals - 01/24/19 1616      PT SHORT TERM GOAL #1   Title  Patient will be independent with HEP, updated PRN, to improve functional strength to improve mobility and return to PLOF.    Baseline  initiated HEP at evaluation    Time  2    Period  Weeks    Status  On-going    Target Date  02/04/19      PT SHORT TERM GOAL #2   Title  Patient will improve LE strength by 1 grade with MMT to indicate significant improvement in strength.    Baseline  significant weakness in bil LE's especially to gluteus medius and hamstrings    Time  4    Period  Weeks    Status  On-going    Target Date  02/11/19      PT SHORT TERM GOAL #3   Title  Patient will perform SLS for 10 seconds on bil LE to indicate improved glut med strangth and improved balance for stair mobility and gait.    Time  4    Period  Weeks    Status  On-going    Target Date  02/11/19      PT SHORT TERM GOAL #4   Title  Patient will ambulate at 0.8 m/s or faster with LRAD during 2 MWT to indicate improved endurance and gait.    Time  4    Period  Weeks    Status  On-going    Target Date  02/11/19        PT Long Term Goals - 01/24/19 1616      PT LONG TERM GOAL #1   Title  Patient will ambulate at 0.8 m/s or faster with LRAD during 6 MWT to indicate improved endurance and gait.    Time  8    Period  Weeks    Status  On-going      PT LONG TERM GOAL #2   Title  Patient will ascend/descend 4 6" stairs with no hand rail at independent level with no overt LOB.    Time  8    Period  Weeks    Status  On-going      PT LONG TERM GOAL #3   Title  Patient will report no low back or posterior leg pain for 2 week period that is above 3/10  indicating improved activity tolerance.    Time  8    Period  Weeks    Status  On-going            Plan - 01/24/19 1618    Clinical Impression Statement   reviewed goals and HEP intially.  Pt able to complete 200 feet with SPC without LOB, however very wide and unsteady BOS.  Progressed exercises to include further LE strengthenning and stability exercises.  Pt with most diffucultyestablishing/maintaining static balance, even with tandem stance.  Pt also with difficulty completing heel and toeraises in standing due to weakness and keeping LE fully extended with SLR.  max cues needed throughout session.    Personal Factors and Comorbidities  Fitness;Comorbidity 2    Comorbidities  DM, HTN, recent sepsis with prolonged hospitalization and weight loss    Examination-Activity Limitations  Bathing;Stairs;Stand;Lift;Locomotion Level    Examination-Participation Restrictions  Laundry;Yard Work;Driving;Community Activity;Cleaning    Stability/Clinical Decision Making  Stable/Uncomplicated    Rehab Potential  Good    PT Frequency  3x / week    PT Duration  8 weeks    PT Treatment/Interventions  ADLs/Self Care Home Management;Aquatic Therapy;Cryotherapy;Electrical Stimulation;Moist Heat;Gait training;DME Instruction;Stair training;Functional mobility training;Therapeutic activities;Therapeutic exercise;Balance training;Neuromuscular re-education;Patient/family education;Manual techniques;Passive range of motion;Dry needling;Taping    PT Next Visit Plan  Continue to increase LE strengthening and balance.  Add vector stances and lunges next session.  Begin static balance exercises with 1 HHA    PT Home Exercise Plan  Eval: clamshell with band, seated hamstring stretch    Consulted and Agree with Plan of Care  Patient       Patient will benefit from skilled therapeutic intervention in order to improve the following deficits and impairments:  Abnormal gait, Decreased activity tolerance, Decreased balance, Decreased mobility, Difficulty walking, Decreased endurance, Decreased coordination, Decreased strength, Pain, Postural dysfunction, Impaired flexibility  Visit  Diagnosis: 1. Muscle weakness (generalized)   2. Difficulty in walking, not elsewhere classified        Problem List Patient Active Problem List   Diagnosis Date Noted  . Type 2 diabetes mellitus with hyperglycemia, with long-term current use of insulin ([place]) 10/20/2018  . Sepsis due to Streptococcus, group B ([place]) 10/05/2018  . Dietary counseling and surveillance 09/15/2018  . Non compliance with medical treatment 09/15/2018  . Open wound of right great toe   . Septic arthritis of left sternoclavicular joint ([place])   . Mediastinal abscess ([place]) 08/27/2018  . Cellulitis of great toe, right   . Diabetic hyperosmolar non-ketotic state ([place]) 08/17/2018  . Hyperglycemia 08/17/2018  . AKI (acute kidney injury) ([place]) 08/16/2018  . Hyponatremia 08/16/2018  . Hyperlipidemia associated with type 2 diabetes mellitus ([place]) 03/04/2017  . Erectile dysfunction 03/04/2017  . Personal history of noncompliance with medical treatment, presenting hazards to health 11/22/2015  . HTN (hypertension), benign 01/30/2014  . Loss of weight 01/30/2014  . Type 2 diabetes mellitus not at goal [facility]) 03/30/2013   Teena Irani, PTA/CLT [phone]  Teena Irani 01/24/2019, 4:25 PM  [place] [address] [place], [place], [address] Phone: [phone]   Fax:  [phone]  Name: [facility] MRN: [id number] Date of Birth: [birth year]

## 2019-01-25 ENCOUNTER — Other Ambulatory Visit: Payer: Self-pay

## 2019-01-25 ENCOUNTER — Telehealth: Payer: Self-pay | Admitting: Family Medicine

## 2019-01-25 ENCOUNTER — Ambulatory Visit (HOSPITAL_COMMUNITY): Payer: Managed Care, Other (non HMO) | Admitting: Physical Therapy

## 2019-01-25 DIAGNOSIS — R262 Difficulty in walking, not elsewhere classified: Secondary | ICD-10-CM

## 2019-01-25 DIAGNOSIS — M6281 Muscle weakness (generalized): Secondary | ICD-10-CM | POA: Diagnosis not present

## 2019-01-25 DIAGNOSIS — Z029 Encounter for administrative examinations, unspecified: Secondary | ICD-10-CM

## 2019-01-25 NOTE — Therapy (Signed)
[place], [place], [address] Phone: [phone]   Fax:  [phone]  Physical Therapy Treatment  Patient Details  Name: Randy Oneal MRN: [id number] Date of Birth: 07-15-1966 Referring Provider (PT): Norva Karvonen, MD   Encounter Date: 01/25/2019  PT End of Session - 01/25/19 1649    Visit Number  3    Number of Visits  24    Date for PT Re-Evaluation  03/11/19    Authorization Type  [employer] Managed (20 visit limit per calander year, 0 used at eval, no auth required)    Authorization Time Period  01/21/2019-03/11/19    Authorization - Visit Number  3    Authorization - Number of Visits  20    PT Start Time  2979    PT Stop Time  1630    PT Time Calculation (min)  52 min    Equipment Utilized During Treatment  Gait belt    Activity Tolerance  Patient tolerated treatment well    Behavior During Therapy  [facility] for tasks assessed/performed       Past Medical History:  Diagnosis Date  . Diabetes mellitus    Insulin dependent  . Hypertension   . Renal insufficiency   . Sepsis due to Streptococcus, group B ([place]) 10/05/2018    Past Surgical History:  Procedure Laterality Date  . APPLICATION OF WOUND VAC Left 08/27/2018   Procedure: APPLICATION OF WOUND VAC, left neck;  Surgeon: Gaye Pollack, MD;  Location: [place];  Service: Thoracic;  Laterality: Left;  . APPLICATION OF WOUND VAC Left 08/30/2018   Procedure: APPLICATION OF WOUND VAC;  Surgeon: Gaye Pollack, MD;  Location: [place];  Service: Thoracic;  Laterality: Left;  . COLONOSCOPY  06/24/2011   Procedure: COLONOSCOPY;  Surgeon: [name], MD;  [facility];  Service: Endoscopy;  Laterality: N/A;  8:30 AM  . STERNAL WOUND DEBRIDEMENT Left 08/27/2018   Procedure: Incision and DEBRIDEMENT Left Chest, Neck and Mediastinum;  Surgeon: Gaye Pollack, MD;  Location: [facility] OR;  Service: Thoracic;  Laterality: Left;  . STERNAL WOUND DEBRIDEMENT  Left 08/30/2018   Procedure: WOUND VAC CHANGE, LEFT CHEST AND NECK, POSSIBLE DEBRIDEMENT;  Surgeon: Gaye Pollack, MD;  Location: [place];  Service: Thoracic;  Laterality: Left;    There were no vitals filed for this visit.  Subjective Assessment - 01/25/19 1548    Subjective  pt reports no real pain today, only soreness.  Son with questions regarding HEP and therapist was shown video of patient walking prior to hospitliazation.    Patient is accompained by:  Family member    Currently in Pain?  No/denies                       [facility] Adult PT Treatment/Exercise - 01/25/19 0001      Knee/Hip Exercises: Stretches   Active Hamstring Stretch  Both;30 seconds;Limitations;2 reps    Active Hamstring Stretch Limitations  standing using 12" step      Knee/Hip Exercises: Standing   Heel Raises  Both;10 reps;Limitations;2 sets    Heel Raises Limitations  toeraises 10 reps 2 sets    Hip Flexion  10 reps;Both;2 sets    Hip Flexion Limitations  2" holds, completing slowly and controlled alternating    SLS  bil, using mirror and intermittent 1 finger HHA, max of 5" each    SLS with Vectors  5X5"  each with 1 UE assist    Gait Training  with [place] [address] feet working on equal step length and heel to toe.     Other Standing Knee Exercises  tandem balance intermittent 1 finger HHA, using mirror      Knee/Hip Exercises: Seated   Sit to Sand  10 reps;without UE support             PT Education - 01/25/19 1707    Education Details  educated son and patient to complete exercises 2X day; may increase to 15 reps next week.    Person(s) Educated  Patient;Child(ren)    Methods  Explanation    Comprehension  Verbalized understanding       PT Short Term Goals - 01/24/19 1616      PT SHORT TERM GOAL #1   Title  Patient will be independent with HEP, updated PRN, to improve functional strength to improve mobility and return to PLOF.    Baseline  initiated HEP at evaluation    Time  2     Period  Weeks    Status  On-going    Target Date  02/04/19      PT SHORT TERM GOAL #2   Title  Patient will improve LE strength by 1 grade with MMT to indicate significant improvement in strength.    Baseline  significant weakness in bil LE's especially to gluteus medius and hamstrings    Time  4    Period  Weeks    Status  On-going    Target Date  02/11/19      PT SHORT TERM GOAL #3   Title  Patient will perform SLS for 10 seconds on bil LE to indicate improved glut med strangth and improved balance for stair mobility and gait.    Time  4    Period  Weeks    Status  On-going    Target Date  02/11/19      PT SHORT TERM GOAL #4   Title  Patient will ambulate at 0.8 m/s or faster with LRAD during 2 MWT to indicate improved endurance and gait.    Time  4    Period  Weeks    Status  On-going    Target Date  02/11/19        PT Long Term Goals - 01/24/19 1616      PT LONG TERM GOAL #1   Title  Patient will ambulate at 0.8 m/s or faster with LRAD during 6 MWT to indicate improved endurance and gait.    Time  8    Period  Weeks    Status  On-going      PT LONG TERM GOAL #2   Title  Patient will ascend/descend 4 6" stairs with no hand rail at independent level with no overt LOB.    Time  8    Period  Weeks    Status  On-going      PT LONG TERM GOAL #3   Title  Patient will report no low back or posterior leg pain for 2 week period that is above 3/10 indicating improved activity tolerance.    Time  8    Period  Weeks    Status  On-going            Plan - 01/25/19 1656    Clinical Impression Statement  continued with established POC focusing on improving LE strength, stability and overall gait quality.  Improved gait exhibited as  compared to last session  with more normalized pattern and stance.  Pt required 2 short seated rest breaks during session today.  Used mirror for biofeedback while completing static balance tasks to help patient improve/correct. Able to maintain  longer today, however still with general weakness.  Added vector stance to help improve this.    Personal Factors and Comorbidities  Fitness;Comorbidity 2    Comorbidities  DM, HTN, recent sepsis with prolonged hospitalization and weight loss    Examination-Activity Limitations  Bathing;Stairs;Stand;Lift;Locomotion Level    Examination-Participation Restrictions  Laundry;Yard Work;Driving;Community Activity;Cleaning    Stability/Clinical Decision Making  Stable/Uncomplicated    Rehab Potential  Good    PT Frequency  3x / week    PT Duration  8 weeks    PT Treatment/Interventions  ADLs/Self Care Home Management;Aquatic Therapy;Cryotherapy;Electrical Stimulation;Moist Heat;Gait training;DME Instruction;Stair training;Functional mobility training;Therapeutic activities;Therapeutic exercise;Balance training;Neuromuscular re-education;Patient/family education;Manual techniques;Passive range of motion;Dry needling;Taping    PT Next Visit Plan  Continue to increase LE strengthening and balance.  Add lunges next session.  Progress balance and challenge as able.    PT Home Exercise Plan  Eval: clamshell with band, seated hamstring stretch  6/16:  standing heelraises, toeraises    Consulted and Agree with Plan of Care  Patient       Patient will benefit from skilled therapeutic intervention in order to improve the following deficits and impairments:  Abnormal gait, Decreased activity tolerance, Decreased balance, Decreased mobility, Difficulty walking, Decreased endurance, Decreased coordination, Decreased strength, Pain, Postural dysfunction, Impaired flexibility  Visit Diagnosis: 1. Muscle weakness (generalized)   2. Difficulty in walking, not elsewhere classified        Problem List Patient Active Problem List   Diagnosis Date Noted  . Type 2 diabetes mellitus with hyperglycemia, with long-term current use of insulin ([place]) 10/20/2018  . Sepsis due to Streptococcus, group B ([place]) 10/05/2018  .  Dietary counseling and surveillance 09/15/2018  . Non compliance with medical treatment 09/15/2018  . Open wound of right great toe   . Septic arthritis of left sternoclavicular joint ([place])   . Mediastinal abscess ([place]) 08/27/2018  . Cellulitis of great toe, right   . Diabetic hyperosmolar non-ketotic state ([place]) 08/17/2018  . Hyperglycemia 08/17/2018  . AKI (acute kidney injury) ([place]) 08/16/2018  . Hyponatremia 08/16/2018  . Hyperlipidemia associated with type 2 diabetes mellitus ([place]) 03/04/2017  . Erectile dysfunction 03/04/2017  . Personal history of noncompliance with medical treatment, presenting hazards to health 11/22/2015  . HTN (hypertension), benign 01/30/2014  . Loss of weight 01/30/2014  . Type 2 diabetes mellitus not at goal [facility]) 03/30/2013   Teena Irani, PTA/CLT [phone]  Teena Irani 01/25/2019, 5:08 PM  [place], [place], [address] Phone: [phone]   Fax:  [phone]  Name: Randy Oneal MRN: [id number] Date of Birth: 08/06/1966

## 2019-01-25 NOTE — Telephone Encounter (Signed)
Patient wife dropped off long term disability forms to be completed and faxed in. Forms are in your folder ,please review and fill in highlighted areas,date and sign.

## 2019-01-25 NOTE — Patient Instructions (Signed)
Heel raise    Using support only for balance, push up onto toes slowly and lower slowly.  Repeat _10___ times. Do _2___ sessions per day.    Toe Raise    Gently rock back on heels and raise toes.do not let your bottom poke back.  Repeat sequence __10__ times per session. Do _2___ sessions per day.

## 2019-01-26 NOTE — Telephone Encounter (Signed)
Randy Oneal In order to fill this improperly I will need to do a virtual visit with this patient to discuss what his limitations are currently and what his long-term plans are  Please set up virtual visit for the purpose of discussing his disability

## 2019-01-28 NOTE — Telephone Encounter (Signed)
[facility] - explained that Dr. Nicki Reaper would like to have a virtual visit with patient to discuss disability paperwork

## 2019-01-31 ENCOUNTER — Other Ambulatory Visit: Payer: Self-pay

## 2019-01-31 ENCOUNTER — Encounter (HOSPITAL_COMMUNITY): Payer: Self-pay

## 2019-01-31 ENCOUNTER — Ambulatory Visit (INDEPENDENT_AMBULATORY_CARE_PROVIDER_SITE_OTHER): Payer: Managed Care, Other (non HMO) | Admitting: Family Medicine

## 2019-01-31 ENCOUNTER — Encounter: Payer: Self-pay | Admitting: Family Medicine

## 2019-01-31 ENCOUNTER — Ambulatory Visit (HOSPITAL_COMMUNITY): Payer: Managed Care, Other (non HMO)

## 2019-01-31 ENCOUNTER — Telehealth (INDEPENDENT_AMBULATORY_CARE_PROVIDER_SITE_OTHER): Payer: Self-pay

## 2019-01-31 DIAGNOSIS — E113553 Type 2 diabetes mellitus with stable proliferative diabetic retinopathy, bilateral: Secondary | ICD-10-CM | POA: Diagnosis not present

## 2019-01-31 DIAGNOSIS — E11319 Type 2 diabetes mellitus with unspecified diabetic retinopathy without macular edema: Secondary | ICD-10-CM

## 2019-01-31 DIAGNOSIS — E119 Type 2 diabetes mellitus without complications: Secondary | ICD-10-CM

## 2019-01-31 DIAGNOSIS — M6281 Muscle weakness (generalized): Secondary | ICD-10-CM

## 2019-01-31 DIAGNOSIS — R262 Difficulty in walking, not elsewhere classified: Secondary | ICD-10-CM

## 2019-01-31 HISTORY — DX: Type 2 diabetes mellitus with unspecified diabetic retinopathy without macular edema: E11.319

## 2019-01-31 NOTE — Progress Notes (Signed)
Subjective:    Patient ID: Randy Oneal, male    DOB: 05-14-1966, 53 y.o.   MRN: [id number] By phone video not possible HPI  Patient has severe diabetes Not under good control Working with endocrinology Also has proliferative diabetic retinopathy both eyes undergoing laser treatments has poor visualization currently will be having more laser treatments later this month in into July His specialist is hoping that they can get his visual acuity much better hopeful that he will be able to resume work prior to start of August The patient also had complicated health issues at 7 AM [place] sepsis and because of this the area of the abscess is finally healed a.m.he is dealing with significant weakness in the legs and arms at times he has to use a cane to move around and he has difficult time going up and down steps and ladders.  And also has subpar strength can only lift 9 pounds. Currently with his job it is necessary for him to lift 20 to 30 pounds or more on a regular basis In addition to this his job would require him to be on his feet all day long and currently right now he is only able to be on his feet for short span of time then he has to sit down In addition to this he is also having significant visual difficulties which makes it such to where he cannot drive currently nor can he see the labels well enough to work with headaches at his job.  Currently on disability.  Hopefully be able to go back to work prior to start of August but it is unknown for certain on this  Needs disability forms filled out. Has been out of work since beginning of January. Has been out of work due to issue with eyes.   Has been out of lisinopril for about one week. Takes 10mg  one daily.   Virtual Visit via Telephone Note  I connected with Kenden Brandt Oneal on 01/31/19 at 11:00 AM EDT by telephone and verified that I am speaking with the correct person using two identifiers.  Location: Patient: home Provider:  office   I discussed the limitations, risks, security and privacy concerns of performing an evaluation and management service by telephone and the availability of in person appointments. I also discussed with the patient that there may be a patient responsible charge related to this service. The patient expressed understanding and agreed to proceed.   History of Present Illness:    Observations/Objective:   Assessment and Plan:   Follow Up Instructions:    I discussed the assessment and treatment plan with the patient. The patient was provided an opportunity to ask questions and all were answered. The patient agreed with the plan and demonstrated an understanding of the instructions.   The patient was advised to call back or seek an in-person evaluation if the symptoms worsen or if the condition fails to improve as anticipated.  I provided 20 minutes of non-face-to-face time during this encounter.     Review of Systems  Constitutional: Negative for diaphoresis and fatigue.  HENT: Negative for congestion and rhinorrhea.   Respiratory: Negative for cough and shortness of breath.   Cardiovascular: Negative for chest pain and leg swelling.  Gastrointestinal: Negative for abdominal pain and diarrhea.  Endocrine: Positive for polydipsia and polyuria.  Skin: Negative for color change and rash.  Neurological: Negative for dizziness and headaches.  Psychiatric/Behavioral: Negative for behavioral problems and confusion.  Objective:   Physical Exam Today's visit was via telephone Physical exam was not possible for this visit        Assessment & Plan:  Poorly controlled diabetes follow-up with specialist on this Proliferative diabetic retinopathy sees eye specialist on a regular basis Disability status patient unable to return to work currently hopefully be able to go back to work by the start of August but this is yet to be determined  Patient cannot work currently  because of muscle weakness associated with his sepsis and abscess from earlier this year and also cannot work because of his poor visual acuity and the need for doing laser treatments currently  Disability forms were filled out to the best of our ability

## 2019-01-31 NOTE — Therapy (Signed)
[place], [place], [address] Phone: [phone]   Fax:  [phone]  Physical Therapy Treatment  Patient Details  Name: Randy Oneal MRN: [id number] Date of Birth: 1966-05-15 Referring Provider (PT): Norva Karvonen, MD   Encounter Date: 01/31/2019  PT End of Session - 01/31/19 1512    Visit Number  4    Number of Visits  24    Date for PT Re-Evaluation  03/11/19    Authorization Type  [employer] Managed (20 visit limit per calander year, 0 used at eval, no auth required)    Authorization Time Period  01/21/2019-03/11/19    Authorization - Visit Number  4    Authorization - Number of Visits  20    PT Start Time  1508   pt late for apt today   PT Stop Time  1550    PT Time Calculation (min)  42 min    Equipment Utilized During Treatment  Gait belt    Activity Tolerance  Patient tolerated treatment well;Patient limited by fatigue    Behavior During Therapy  [facility] for tasks assessed/performed       Past Medical History:  Diagnosis Date  . Diabetes mellitus    Insulin dependent  . Diabetic retinopathy of both eyes ([employer]) 01/31/2019   Dr Coralyn Pear 01/2019  . Hypertension   . Renal insufficiency   . Sepsis due to Streptococcus, group B ([place]) 10/05/2018    Past Surgical History:  Procedure Laterality Date  . APPLICATION OF WOUND VAC Left 08/27/2018   Procedure: APPLICATION OF WOUND VAC, left neck;  Surgeon: Gaye Pollack, MD;  Location: [place];  Service: Thoracic;  Laterality: Left;  . APPLICATION OF WOUND VAC Left 08/30/2018   Procedure: APPLICATION OF WOUND VAC;  Surgeon: Gaye Pollack, MD;  Location: [place];  Service: Thoracic;  Laterality: Left;  . COLONOSCOPY  06/24/2011   Procedure: COLONOSCOPY;  Surgeon: [name], MD;  [facility];  Service: Endoscopy;  Laterality: N/A;  8:30 AM  . STERNAL WOUND DEBRIDEMENT Left 08/27/2018   Procedure: Incision and DEBRIDEMENT Left Chest, Neck and  Mediastinum;  Surgeon: Gaye Pollack, MD;  Location: [facility] OR;  Service: Thoracic;  Laterality: Left;  . STERNAL WOUND DEBRIDEMENT Left 08/30/2018   Procedure: WOUND VAC CHANGE, LEFT CHEST AND NECK, POSSIBLE DEBRIDEMENT;  Surgeon: Gaye Pollack, MD;  Location: [place];  Service: Thoracic;  Laterality: Left;    There were no vitals filed for this visit.  Subjective Assessment - 01/31/19 1511    Subjective  Pt stated he is feeling good today, no reoprts of pain or recent fall.  Reports compliance wiht HEP daily.    Pertinent History  Mr. Filsinger presented to [facility] on 08/17/18 with signs of sepsis. He was transferred to [facility] where he underwent surgery on 08/27/18 for Lt sternoclavicular infection with abscess to Lt neck and mediastinum. The wound was debrided and wound vac changed again on 08/30/18. Pt was discharged on 09/05/18 with home care to manage wound vac and IV line. On 01/19/19 patient had his final follow up with his surgeon, Gaye Pollack, MD, and was discharged as wound was fully healed and no signs of infection were present any longer. He is to follow up with his PCP no, Sallee Lange, MD.    Limitations  Walking;Standing;Lifting    Currently in Pain?  No/denies  [place] Adult PT Treatment/Exercise - 01/31/19 0001      Knee/Hip Exercises: Standing   Heel Raises  Both;10 reps;Limitations;2 sets    Heel Raises Limitations  toeraises 10 reps 2 sets    Hip Flexion  10 reps;Both;2 sets    Hip Flexion Limitations  2" holds, completing slowly and controlled alternating    Forward Lunges  Both;10 reps;3 seconds    Forward Lunges Limitations  1 HHA, on 6 in step     Side Lunges  Both;10 reps    Side Lunges Limitations  6in step, BUE A    Functional Squat  10 reps    Functional Squat Limitations  front of chair for form    SLS  bil, using mirror and intermittent 1 finger HHA, max of 3" each    SLS with Vectors  5X5" each with 1 UE assist                PT Short Term Goals - 01/24/19 1616      PT SHORT TERM GOAL #1   Title  Patient will be independent with HEP, updated PRN, to improve functional strength to improve mobility and return to PLOF.    Baseline  initiated HEP at evaluation    Time  2    Period  Weeks    Status  On-going    Target Date  02/04/19      PT SHORT TERM GOAL #2   Title  Patient will improve LE strength by 1 grade with MMT to indicate significant improvement in strength.    Baseline  significant weakness in bil LE's especially to gluteus medius and hamstrings    Time  4    Period  Weeks    Status  On-going    Target Date  02/11/19      PT SHORT TERM GOAL #3   Title  Patient will perform SLS for 10 seconds on bil LE to indicate improved glut med strangth and improved balance for stair mobility and gait.    Time  4    Period  Weeks    Status  On-going    Target Date  02/11/19      PT SHORT TERM GOAL #4   Title  Patient will ambulate at 0.8 m/s or faster with LRAD during 2 MWT to indicate improved endurance and gait.    Time  4    Period  Weeks    Status  On-going    Target Date  02/11/19        PT Long Term Goals - 01/24/19 1616      PT LONG TERM GOAL #1   Title  Patient will ambulate at 0.8 m/s or faster with LRAD during 6 MWT to indicate improved endurance and gait.    Time  8    Period  Weeks    Status  On-going      PT LONG TERM GOAL #2   Title  Patient will ascend/descend 4 6" stairs with no hand rail at independent level with no overt LOB.    Time  8    Period  Weeks    Status  On-going      PT LONG TERM GOAL #3   Title  Patient will report no low back or posterior leg pain for 2 week period that is above 3/10 indicating improved activity tolerance.    Time  8    Period  Weeks    Status  On-going  Plan - 01/31/19 1557    Clinical Impression Statement  Continued with established POC focusing on LE strengthening and balance training.  Progressed  gluteal strengthening with additional forward/side lunges, squats and sidestep.  Pt with multimodal cueing for proper for with new exercises.  Used mirror for biofeedback wiht positive results including form.  Min A and/or HHA required with balance activities for safety.  EOS pt limited by fatigue, no reports of pain through session.    Personal Factors and Comorbidities  Fitness;Comorbidity 2    Comorbidities  DM, HTN, recent sepsis with prolonged hospitalization and weight loss    Examination-Activity Limitations  Bathing;Stairs;Stand;Lift;Locomotion Level    Examination-Participation Restrictions  Laundry;Yard Work;Driving;Community Activity;Cleaning    Stability/Clinical Decision Making  Stable/Uncomplicated    Clinical Decision Making  Low    Rehab Potential  Good    PT Frequency  3x / week    PT Duration  8 weeks    PT Treatment/Interventions  ADLs/Self Care Home Management;Aquatic Therapy;Cryotherapy;Electrical Stimulation;Moist Heat;Gait training;DME Instruction;Stair training;Functional mobility training;Therapeutic activities;Therapeutic exercise;Balance training;Neuromuscular re-education;Patient/family education;Manual techniques;Passive range of motion;Dry needling;Taping    PT Next Visit Plan  Continue to increase LE strengthening and balance.  Add theraband with sidestep when appropriate.  Progress balance and challenge as able.    PT Home Exercise Plan  Eval: clamshell with band, seated hamstring stretch  6/16:  standing heelraises, toeraises       Patient will benefit from skilled therapeutic intervention in order to improve the following deficits and impairments:  Abnormal gait, Decreased activity tolerance, Decreased balance, Decreased mobility, Difficulty walking, Decreased endurance, Decreased coordination, Decreased strength, Pain, Postural dysfunction, Impaired flexibility  Visit Diagnosis: 1. Difficulty in walking, not elsewhere classified   2. Muscle weakness  (generalized)        Problem List Patient Active Problem List   Diagnosis Date Noted  . Diabetic retinopathy of both eyes ([place]) 01/31/2019  . Type 2 diabetes mellitus with hyperglycemia, with long-term current use of insulin ([place]) 10/20/2018  . Sepsis due to Streptococcus, group B ([place]) 10/05/2018  . Dietary counseling and surveillance 09/15/2018  . Non compliance with medical treatment 09/15/2018  . Open wound of right great toe   . Septic arthritis of left sternoclavicular joint ([place])   . Mediastinal abscess ([place]) 08/27/2018  . Cellulitis of great toe, right   . Diabetic hyperosmolar non-ketotic state ([place]) 08/17/2018  . Hyperglycemia 08/17/2018  . AKI (acute kidney injury) ([place]) 08/16/2018  . Hyponatremia 08/16/2018  . Hyperlipidemia associated with type 2 diabetes mellitus ([place]) 03/04/2017  . Erectile dysfunction 03/04/2017  . Personal history of noncompliance with medical treatment, presenting hazards to health 11/22/2015  . HTN (hypertension), benign 01/30/2014  . Loss of weight 01/30/2014  . Type 2 diabetes mellitus not at goal [facility]) 03/30/2013   Ihor Austin, [place]; [place]  Aldona Lento 01/31/2019, 4:54 PM  [place] [address] [place], [place], [address] Phone: [phone]   Fax:  [phone]  Name: Tatum Massman Oneal MRN: [id number] Date of Birth: 04/21/1966

## 2019-02-02 ENCOUNTER — Ambulatory Visit (HOSPITAL_COMMUNITY): Payer: Managed Care, Other (non HMO) | Admitting: Physical Therapy

## 2019-02-02 ENCOUNTER — Other Ambulatory Visit: Payer: Self-pay

## 2019-02-02 DIAGNOSIS — M6281 Muscle weakness (generalized): Secondary | ICD-10-CM

## 2019-02-02 DIAGNOSIS — R262 Difficulty in walking, not elsewhere classified: Secondary | ICD-10-CM

## 2019-02-02 NOTE — Therapy (Signed)
[place], [place], [address] Phone: [phone]   Fax:  [phone]  Physical Therapy Treatment  Patient Details  Name: Randy Oneal MRN: [id number] Date of Birth: 1966-01-25 Referring Provider (PT): Norva Karvonen, MD   Encounter Date: 02/02/2019  PT End of Session - 02/02/19 1532    Visit Number  5    Number of Visits  24    Date for PT Re-Evaluation  03/11/19    Authorization Type  [employer] Managed (20 visit limit per calander year, 0 used at eval, no auth required)    Authorization Time Period  01/21/2019-03/11/19    Authorization - Visit Number  5    Authorization - Number of Visits  20    PT Start Time  7353    PT Stop Time  1524    PT Time Calculation (min)  46 min    Equipment Utilized During Treatment  Gait belt    Activity Tolerance  Patient tolerated treatment well;Patient limited by fatigue    Behavior During Therapy  [facility] for tasks assessed/performed       Past Medical History:  Diagnosis Date  . Diabetes mellitus    Insulin dependent  . Diabetic retinopathy of both eyes ([place]) 01/31/2019   Dr Coralyn Pear 01/2019  . Hypertension   . Renal insufficiency   . Sepsis due to Streptococcus, group B ([place]) 10/05/2018    Past Surgical History:  Procedure Laterality Date  . APPLICATION OF WOUND VAC Left 08/27/2018   Procedure: APPLICATION OF WOUND VAC, left neck;  Surgeon: Gaye Pollack, MD;  Location: [facility];  Service: Thoracic;  Laterality: Left;  . APPLICATION OF WOUND VAC Left 08/30/2018   Procedure: APPLICATION OF WOUND VAC;  Surgeon: Gaye Pollack, MD;  Location: [facility];  Service: Thoracic;  Laterality: Left;  . COLONOSCOPY  06/24/2011   Procedure: COLONOSCOPY;  Surgeon: [name], MD;  [facility];  Service: Endoscopy;  Laterality: N/A;  8:30 AM  . STERNAL WOUND DEBRIDEMENT Left 08/27/2018   Procedure: Incision and DEBRIDEMENT Left Chest, Neck and Mediastinum;  Surgeon: Gaye Pollack, MD;  Location: [facility] OR;  Service: Thoracic;  Laterality: Left;  . STERNAL WOUND DEBRIDEMENT Left 08/30/2018   Procedure: WOUND VAC CHANGE, LEFT CHEST AND NECK, POSSIBLE DEBRIDEMENT;  Surgeon: Gaye Pollack, MD;  Location: [facility];  Service: Thoracic;  Laterality: Left;    There were no vitals filed for this visit.  Subjective Assessment - 02/02/19 1445    Subjective  Pt states he is doing well today.  Been doing his HEP.                       [place] Adult PT Treatment/Exercise - 02/02/19 0001      Knee/Hip Exercises: Standing   Heel Raises  Both;Limitations;2 sets;15 reps    Heel Raises Limitations  toeraises 15 reps 2 sets    Hip Flexion  Both;2 sets;15 reps    Hip Flexion Limitations  2" holds, completing slowly and controlled alternating    Forward Lunges  Both;3 seconds;15 reps    Forward Lunges Limitations  1 HHA, on 4 in step     Side Lunges  Both;15 reps    Side Lunges Limitations  4in step, BUE A    SLS with Vectors  10X5" each with 1 UE assist    Gait Training  with SPC 300 feet working on equal  step length and heel to toe.     Other Standing Knee Exercises  sidestepping on blue line, no AD 2RT               PT Short Term Goals - 01/24/19 1616      PT SHORT TERM GOAL #1   Title  Patient will be independent with HEP, updated PRN, to improve functional strength to improve mobility and return to PLOF.    Baseline  initiated HEP at evaluation    Time  2    Period  Weeks    Status  On-going    Target Date  02/04/19      PT SHORT TERM GOAL #2   Title  Patient will improve LE strength by 1 grade with MMT to indicate significant improvement in strength.    Baseline  significant weakness in bil LE's especially to gluteus medius and hamstrings    Time  4    Period  Weeks    Status  On-going    Target Date  02/11/19      PT SHORT TERM GOAL #3   Title  Patient will perform SLS for 10 seconds on bil LE to indicate improved glut med strangth and  improved balance for stair mobility and gait.    Time  4    Period  Weeks    Status  On-going    Target Date  02/11/19      PT SHORT TERM GOAL #4   Title  Patient will ambulate at 0.8 m/s or faster with LRAD during 2 MWT to indicate improved endurance and gait.    Time  4    Period  Weeks    Status  On-going    Target Date  02/11/19        PT Long Term Goals - 01/24/19 1616      PT LONG TERM GOAL #1   Title  Patient will ambulate at 0.8 m/s or faster with LRAD during 6 MWT to indicate improved endurance and gait.    Time  8    Period  Weeks    Status  On-going      PT LONG TERM GOAL #2   Title  Patient will ascend/descend 4 6" stairs with no hand rail at independent level with no overt LOB.    Time  8    Period  Weeks    Status  On-going      PT LONG TERM GOAL #3   Title  Patient will report no low back or posterior leg pain for 2 week period that is above 3/10 indicating improved activity tolerance.    Time  8    Period  Weeks    Status  On-going            Plan - 02/02/19 1521    Clinical Impression Statement  contiued with exercise progression, increasing reps as able. Pt with improved stability this session noted with vectors, however began to fatigue after 5 reps.  Added side stepping without AD or assistance without any LOB or issues.  Finished session with ambulation around dept with SPC.  Noted improvement in sequencing and overall gait quality.    Personal Factors and Comorbidities  Fitness;Comorbidity 2    Comorbidities  DM, HTN, recent sepsis with prolonged hospitalization and weight loss    Examination-Activity Limitations  Bathing;Stairs;Stand;Lift;Locomotion Level    Examination-Participation Restrictions  Laundry;Yard Work;Driving;Community Activity;Cleaning    Stability/Clinical Decision Making  Stable/Uncomplicated  Rehab Potential  Good    PT Frequency  3x / week    PT Duration  8 weeks    PT Treatment/Interventions  ADLs/Self Care Home  Management;Aquatic Therapy;Cryotherapy;Electrical Stimulation;Moist Heat;Gait training;DME Instruction;Stair training;Functional mobility training;Therapeutic activities;Therapeutic exercise;Balance training;Neuromuscular re-education;Patient/family education;Manual techniques;Passive range of motion;Dry needling;Taping    PT Next Visit Plan  Continue to increase LE strengthening and balance.  Add theraband with sidestep when appropriate.  Progress balance and challenge as able. Next session try gait without AD.    PT Home Exercise Plan  Eval: clamshell with band, seated hamstring stretch  6/16:  standing heelraises, toeraises       Patient will benefit from skilled therapeutic intervention in order to improve the following deficits and impairments:  Abnormal gait, Decreased activity tolerance, Decreased balance, Decreased mobility, Difficulty walking, Decreased endurance, Decreased coordination, Decreased strength, Pain, Postural dysfunction, Impaired flexibility  Visit Diagnosis: 1. Muscle weakness (generalized)   2. Difficulty in walking, not elsewhere classified        Problem List Patient Active Problem List   Diagnosis Date Noted  . Diabetic retinopathy of both eyes ([place]) 01/31/2019  . Type 2 diabetes mellitus with hyperglycemia, with long-term current use of insulin ([place]) 10/20/2018  . Sepsis due to Streptococcus, group B ([place]) 10/05/2018  . Dietary counseling and surveillance 09/15/2018  . Non compliance with medical treatment 09/15/2018  . Open wound of right great toe   . Septic arthritis of left sternoclavicular joint ([place])   . Mediastinal abscess ([place]) 08/27/2018  . Cellulitis of great toe, right   . Diabetic hyperosmolar non-ketotic state ([place]) 08/17/2018  . Hyperglycemia 08/17/2018  . AKI (acute kidney injury) ([place]) 08/16/2018  . Hyponatremia 08/16/2018  . Hyperlipidemia associated with type 2 diabetes mellitus ([place]) 03/04/2017  . Erectile dysfunction 03/04/2017  .  Personal history of noncompliance with medical treatment, presenting hazards to health 11/22/2015  . HTN (hypertension), benign 01/30/2014  . Loss of weight 01/30/2014  . Type 2 diabetes mellitus not at goal [facility]) 03/30/2013   Teena Irani, PTA/CLT [phone]  Teena Irani 02/02/2019, 3:33 PM  [facility] [address] [place], [place], [address] Phone: [phone]   Fax:  [phone]  Name: Authur Cubit Oneal MRN: [id number] Date of Birth: 1966-03-26

## 2019-02-02 NOTE — Progress Notes (Signed)
[facility] Note  02/04/2019     CHIEF COMPLAINT Patient presents for Retina Evaluation   HISTORY OF PRESENT ILLNESS: Randy Oneal is a 53 y.o. male who presents to the clinic today for:   HPI    Retina Evaluation    In both eyes.  This started weeks ago.  Duration of weeks.  Context:  distance vision and near vision.  Response to treatment was mild improvement.  I, the attending physician,  performed the HPI with the patient and updated documentation appropriately.          Comments    Patient feels like his vision in his left eye is improving overall.  Patient states right eye still looks like there is a cloud in the center of vision.  Patient denies eye pain or discomfort and denies any new or worsening floaters or fol OU.       Last edited by Bernarda Caffey, MD on 02/05/2019  3:02 AM. (History)    pt states he feels like his left eye vision has gotten better, but his right eye vision seems like there is a cloud in the center of his vision, but he can see around it periherally  Referring physician: [name], MD [address] [place],  [place] [address]  HISTORICAL INFORMATION:   Selected notes from the MEDICAL RECORD NUMBER Referred by Dr.Mark Gershon Crane for concern of decreased vision post cataract sx LEE:  Ocular Hx- PMH-   CURRENT MEDICATIONS: Current Outpatient Medications (Ophthalmic Drugs)  Medication Sig  . ketorolac (ACULAR) 0.5 % ophthalmic solution Place 2 drops into both eyes 3 (three) times daily.  Marland Kitchen ofloxacin (OCUFLOX) 0.3 % ophthalmic solution   . prednisoLONE acetate (PRED FORTE) 1 % ophthalmic suspension Place 2 drops into both eyes 3 (three) times daily.   No current facility-administered medications for this visit.  (Ophthalmic Drugs)   Current Outpatient Medications (Other)  Medication Sig  . amLODipine (NORVASC) 5 MG tablet Take one tablet by mouth each day  . atorvastatin (LIPITOR) 10 MG tablet TAKE  ONE TABLET BY MOUTH ONCE DAILY.  Marland Kitchen Continuous Blood Gluc Sensor (FREESTYLE LIBRE SENSOR SYSTEM) MISC Use one sensor every 10 days.  . furosemide (LASIX) 40 MG tablet Take 1 tablet (40 mg total) by mouth daily as needed. Take for weight gain of 3-5 lbs  . glucose blood (BAYER CONTOUR NEXT TEST) test strip USE TO TEST FOUR TIMES DAILY.  Marland Kitchen insulin lispro (HUMALOG KWIKPEN) 100 UNIT/ML KwikPen Inject 0.06 mLs (6 Units total) into the skin daily with breakfast AND 0.07 mLs (7 Units total) 2 (two) times daily before lunch and supper.  . Insulin Pen Needle (BD PEN NEEDLE NANO 2ND GEN) 32G X 4 MM MISC Four times daily  . lisinopril (PRINIVIL,ZESTRIL) 10 MG tablet   . metoprolol tartrate (LOPRESSOR) 50 MG tablet Take one half tablet by mouth twice daily  . MICROLET LANCETS MISC USE FOUR TIMES A DAY AS DIRECTED.  Marland Kitchen nystatin (MYCOSTATIN/NYSTOP) powder Apply topically 2 (two) times daily. To groin and scrotum  . pantoprazole (PROTONIX) 40 MG tablet Take 1 tablet (40 mg total) by mouth daily.  . potassium chloride SA (K-DUR,KLOR-CON) 20 MEQ tablet Take 1 tablet (20 mEq total) by mouth daily as needed. Only take on days you use Lasix  . sildenafil (REVATIO) 20 MG tablet May take up to 5 before relations as directed  . TRESIBA FLEXTOUCH 100 UNIT/ML SOPN FlexTouch Pen Inject 0.19  mLs (19 Units total) into the skin at bedtime.   No current facility-administered medications for this visit.  (Other)      REVIEW OF SYSTEMS: ROS    Positive for: Endocrine, Eyes   Negative for: Constitutional, Gastrointestinal, Neurological, Skin, Genitourinary, Musculoskeletal, HENT, Cardiovascular, Respiratory, Psychiatric, Allergic/Imm, Heme/Lymph   Last edited by Doneen Poisson on 02/04/2019  2:08 PM. (History)       ALLERGIES Allergies  Allergen Reactions  . Tramadol Nausea And Vomiting    Nausea and vomiting    PAST MEDICAL HISTORY Past Medical History:  Diagnosis Date  . Diabetes mellitus    Insulin  dependent  . Diabetic retinopathy of both eyes ([place]) 01/31/2019   Dr Coralyn Pear 01/2019  . Hypertension   . Renal insufficiency   . Sepsis due to Streptococcus, group B ([place]) 10/05/2018   Past Surgical History:  Procedure Laterality Date  . APPLICATION OF WOUND VAC Left 08/27/2018   Procedure: APPLICATION OF WOUND VAC, left neck;  Surgeon: Gaye Pollack, MD;  Location: [facility];  Service: Thoracic;  Laterality: Left;  . APPLICATION OF WOUND VAC Left 08/30/2018   Procedure: APPLICATION OF WOUND VAC;  Surgeon: Gaye Pollack, MD;  Location: [facility];  Service: Thoracic;  Laterality: Left;  . COLONOSCOPY  06/24/2011   Procedure: COLONOSCOPY;  Surgeon: [name], MD;  [facility];  Service: Endoscopy;  Laterality: N/A;  8:30 AM  . STERNAL WOUND DEBRIDEMENT Left 08/27/2018   Procedure: Incision and DEBRIDEMENT Left Chest, Neck and Mediastinum;  Surgeon: Gaye Pollack, MD;  Location: [facility] OR;  Service: Thoracic;  Laterality: Left;  . STERNAL WOUND DEBRIDEMENT Left 08/30/2018   Procedure: WOUND VAC CHANGE, LEFT CHEST AND NECK, POSSIBLE DEBRIDEMENT;  Surgeon: Gaye Pollack, MD;  [facility];  Service: Thoracic;  Laterality: Left;    FAMILY HISTORY Family History  Problem Relation Age of Onset  . Hypertension Mother   . Colon cancer Neg Hx     SOCIAL HISTORY Social History   Tobacco Use  . Smoking status: Never Smoker  . Smokeless tobacco: Never Used  Substance Use Topics  . Alcohol use: No  . Drug use: No         OPHTHALMIC EXAM:  Base Eye Exam    Visual Acuity (Snellen - Linear)      Right Left   Dist [place] HM 20/80 +1   Dist ph [place] NI NI       Tonometry (Tonopen, 2:06 PM)      Right Left   Pressure 13 13       Pupils      Dark Light Shape React APD   Right 3 2 Round Minimal 0   Left 3 2 Round Minimal 0       Extraocular Movement      Right Left    Full Full       Neuro/Psych    Oriented x3: Yes   Mood/Affect: Normal       Dilation    Both eyes:  1.0% Mydriacyl, 2.5% Phenylephrine @ 2:07 PM        Slit Lamp and Fundus Exam    Slit Lamp Exam      Right Left   Lids/Lashes mild Meibomian gland dysfunction mild Meibomian gland dysfunction   Conjunctiva/Sclera White and quiet White and quiet   Cornea mild Arcus, 1+ Descemet's folds, Well healed temporal cataract wounds mild Arcus, 2+ Descemet's folds, Well healed temporal cataract wounds  Anterior Chamber 4+pigment Deep, 3-4+pigment   Iris slightly irregular, dilated Round and dilated   Lens PC IOL in good position PC IOL in good position   Vitreous Vitreous syneresis, +cell/pigment in anterior vitreous, diffuse vitreous Hemorrhage - clearing, vitreous condensations/traction posteriorly Vitreous syneresis, +cell/pigment in anterior vitreous, diffuse vitreous Hemorrhage       Fundus Exam      Right Left   Disc obscured by pre-retinal heme, NVE, NVD, fibrosis Obscured by fibrosis, regressing NVD   Macula hazy view; VH and preretinal heme obscuring, TRD Blunted foveal reflex, tractional fibrosis temporal macula   Periphery red reflex; grossly attached red reflex; grossly attached, old VH obscuring view, PRP laser changes, superiorly, nasally and inferiorly          IMAGING AND PROCEDURES  Imaging and Procedures for @TODAY @  OCT, Retina - OU - Both Eyes       Right Eye Progression has no prior data. Findings include preretinal fibrosis.   Left Eye Quality was poor. Central Foveal Thickness: 415. Progression has worsened. Findings include vitreous traction, preretinal fibrosis, intraretinal fluid, no SRF, macular pucker, abnormal foveal contour, intraretinal hyper-reflective material, epiretinal membrane (Mild interval improvement in vitreous opacities).   Notes *Images captured and stored on drive  Diagnosis / Impression:  No image obtained today OD Mild interval improvement in vitreous opacities OS Scattered patches of preretinal fibrosis with vitreous traction  OS   Clinical management:  See below  Abbreviations: NFP - Normal foveal profile. CME - cystoid macular edema. PED - pigment epithelial detachment. IRF - intraretinal fluid. SRF - subretinal fluid. EZ - ellipsoid zone. ERM - epiretinal membrane. ORA - outer retinal atrophy. ORT - outer retinal tubulation. SRHM - subretinal hyper-reflective material                 ASSESSMENT/PLAN:    ICD-10-CM   1. Proliferative diabetic retinopathy of both eyes with macular edema associated with type 2 diabetes mellitus ([place])  D98.3382   2. Vitreous hemorrhage of both eyes ([place])  H43.13   3. Retinal edema  H35.81 OCT, Retina - OU - Both Eyes  4. Essential hypertension  I10   5. Hypertensive retinopathy of both eyes  H35.033   6. Pseudophakia of both eyes  Z96.1     1-3. Proliferative diabetic retinopathy with DME and vitreous hemorrhage OU (OD > OS)  - pt with complex medical history with hospitalization in Jan-Feb 2020 for bacteremia and abscess  - history of poor glycemic control for years  - The incidence, risk factors for progression, natural history and treatment options for diabetic retinopathy were discussed with patient.    - The need for close monitoring of blood glucose, blood pressure, and serum lipids, avoiding cigarette or any type of tobacco, and the need for long term follow up was also discussed with patient.  - s/p IVA OU #1 (06.02.20)   - s/p PRP OS (06.10.20)  - BCVA OD HM from CF and 20/400 initially; OS stable at 20/80  - exam shows diffuse vitreous and preretinal hemorrhage + scattered fibrosis OU -- mild improvement post injection  - OCT shows preretinal fibrosis w/ traction OU  - discussed findings, guarded prognosis, and treatment options including possibility of surgery  - may need surgery in both eyes, but will recommend PPV w/ membrane peel, endolaser, gas vs oil OD first  - f/u 1 week - DFE/OCT/Optos color photos/possible repeat injection and surgical  planning  4,5. Hypertensive retinopathy OU  - discussed  importance of tight BP control  - monitor  6. Pseudophakia OU  - s/p CE/IOL OU (Dr. Gershon Crane)  - post op drops per Dr. Gershon Crane  - recommend adding NSAID QID OU -- samples of prolensa given   Ophthalmic Meds Ordered this visit:  No orders of the defined types were placed in this encounter.      Return for f/u July 1, PDR OU, DFE, OCT.  There are no Patient Instructions on file for this visit.   Explained the diagnoses, plan, and follow up with the patient and they expressed understanding.  Patient expressed understanding of the importance of proper follow up care.   This document serves as a record of services personally performed by Gardiner Sleeper, MD, PhD. It was created on their behalf by Ernest Mallick, OA, an ophthalmic assistant. The creation of this record is the provider's dictation and/or activities during the visit.    Electronically signed by: Ernest Mallick, OA  06.24.2020 3:06 AM     Gardiner Sleeper, M.D., Ph.D. Diseases & Surgery of the Retina and Vitreous [facility]  I have reviewed the above documentation for accuracy and completeness, and I agree with the above. Gardiner Sleeper, M.D., Ph.D. 02/05/19 3:06 AM     Abbreviations: M myopia (nearsighted); A astigmatism; H hyperopia (farsighted); P presbyopia; Mrx spectacle prescription;  CTL contact lenses; OD right eye; OS left eye; OU both eyes  XT exotropia; ET esotropia; PEK punctate epithelial keratitis; PEE punctate epithelial erosions; DES dry eye syndrome; MGD meibomian gland dysfunction; ATs artificial tears; PFAT's preservative free artificial tears; [place] nuclear sclerotic cataract; PSC posterior subcapsular cataract; ERM epi-retinal membrane; PVD posterior vitreous detachment; RD retinal detachment; DM diabetes mellitus; DR diabetic retinopathy; NPDR non-proliferative diabetic retinopathy; PDR proliferative diabetic retinopathy;  CSME clinically significant macular edema; DME diabetic macular edema; dbh dot blot hemorrhages; CWS cotton wool spot; POAG primary open angle glaucoma; C/D cup-to-disc ratio; HVF humphrey visual field; GVF goldmann visual field; OCT optical coherence tomography; IOP intraocular pressure; BRVO Branch retinal vein occlusion; CRVO central retinal vein occlusion; CRAO central retinal artery occlusion; BRAO branch retinal artery occlusion; RT retinal tear; SB scleral buckle; PPV pars plana vitrectomy; VH Vitreous hemorrhage; PRP panretinal laser photocoagulation; IVK intravitreal kenalog; VMT vitreomacular traction; MH Macular hole;  NVD neovascularization of the disc; NVE neovascularization elsewhere; AREDS age related eye disease study; ARMD age related macular degeneration; POAG primary open angle glaucoma; EBMD epithelial/anterior basement membrane dystrophy; ACIOL anterior chamber intraocular lens; IOL intraocular lens; PCIOL posterior chamber intraocular lens; Phaco/IOL phacoemulsification with intraocular lens placement; [place] photorefractive keratectomy; LASIK laser assisted in situ keratomileusis; HTN hypertension; DM diabetes mellitus; COPD chronic obstructive pulmonary disease

## 2019-02-03 ENCOUNTER — Ambulatory Visit (HOSPITAL_COMMUNITY): Payer: Managed Care, Other (non HMO)

## 2019-02-03 ENCOUNTER — Encounter (HOSPITAL_COMMUNITY): Payer: Self-pay

## 2019-02-03 DIAGNOSIS — R262 Difficulty in walking, not elsewhere classified: Secondary | ICD-10-CM

## 2019-02-03 DIAGNOSIS — M6281 Muscle weakness (generalized): Secondary | ICD-10-CM | POA: Diagnosis not present

## 2019-02-03 NOTE — Therapy (Signed)
[place], [place], [address] Phone: [phone]   Fax:  [phone]  Physical Therapy Treatment  Patient Details  Name: Randy Oneal MRN: [id number] Date of Birth: 1965-12-23 Referring Provider (PT): Norva Karvonen, MD   Encounter Date: 02/03/2019  PT End of Session - 02/03/19 1558    Visit Number  6    Number of Visits  24    Date for PT Re-Evaluation  03/11/19    Authorization Type  [employer] Managed (20 visit limit per calander year, 0 used at eval, no auth required)    Authorization Time Period  01/21/2019-03/11/19    Authorization - Visit Number  6    Authorization - Number of Visits  20    PT Start Time  1527   pt late   PT Stop Time  1614    PT Time Calculation (min)  47 min    Equipment Utilized During Treatment  Gait belt    Activity Tolerance  Patient tolerated treatment well;Patient limited by fatigue    Behavior During Therapy  [facility] for tasks assessed/performed       Past Medical History:  Diagnosis Date  . Diabetes mellitus    Insulin dependent  . Diabetic retinopathy of both eyes ([place]) 01/31/2019   Dr Coralyn Pear 01/2019  . Hypertension   . Renal insufficiency   . Sepsis due to Streptococcus, group B ([place]) 10/05/2018    Past Surgical History:  Procedure Laterality Date  . APPLICATION OF WOUND VAC Left 08/27/2018   Procedure: APPLICATION OF WOUND VAC, left neck;  Surgeon: Gaye Pollack, MD;  Location: [place];  Service: Thoracic;  Laterality: Left;  . APPLICATION OF WOUND VAC Left 08/30/2018   Procedure: APPLICATION OF WOUND VAC;  Surgeon: Gaye Pollack, MD;  Location: [place];  Service: Thoracic;  Laterality: Left;  . COLONOSCOPY  06/24/2011   Procedure: COLONOSCOPY;  Surgeon: [name], MD;  [facility];  Service: Endoscopy;  Laterality: N/A;  8:30 AM  . STERNAL WOUND DEBRIDEMENT Left 08/27/2018   Procedure: Incision and DEBRIDEMENT Left Chest, Neck and Mediastinum;  Surgeon:  Gaye Pollack, MD;  Location: [facility] OR;  Service: Thoracic;  Laterality: Left;  . STERNAL WOUND DEBRIDEMENT Left 08/30/2018   Procedure: WOUND VAC CHANGE, LEFT CHEST AND NECK, POSSIBLE DEBRIDEMENT;  Surgeon: Gaye Pollack, MD;  Location: [place];  Service: Thoracic;  Laterality: Left;    There were no vitals filed for this visit.  Subjective Assessment - 02/03/19 1530    Subjective  Patient states he is good and has had a good week. He is sore on his butt form the exercises.    Limitations  Walking;Standing;Lifting    Currently in Pain?  No/denies       [facility] Adult PT Treatment/Exercise - 02/03/19 0001      Knee/Hip Exercises: Machines for Strengthening   Cybex Knee Flexion  1x 10 reps, plate 4 ([id number]), bil LE    Total Gym Leg Press  BodyCraft Leg Press: 2x 10 reps, 30lbs      Knee/Hip Exercises: Standing   Heel Raises  Both;Limitations;2 sets;15 reps    Heel Raises Limitations  2x 15 reps, on decline, 3 sec holds    Forward Lunges  Both;2 sets;10 reps    Forward Lunges Limitations  4" step, bil UE support,a    Gait Training  3x forward stepping with SPC in ladder; 1 foot per box  Knee/Hip Exercises: Seated   Long Arc Quad  Strengthening;Both;2 sets;10 reps;Weights    Long Arc Quad Weight  10 lbs.         PT Short Term Goals - 01/24/19 1616      PT SHORT TERM GOAL #1   Title  Patient will be independent with HEP, updated PRN, to improve functional strength to improve mobility and return to PLOF.    Baseline  initiated HEP at evaluation    Time  2    Period  Weeks    Status  On-going    Target Date  02/04/19      PT SHORT TERM GOAL #2   Title  Patient will improve LE strength by 1 grade with MMT to indicate significant improvement in strength.    Baseline  significant weakness in bil LE's especially to gluteus medius and hamstrings    Time  4    Period  Weeks    Status  On-going    Target Date  02/11/19      PT SHORT TERM GOAL #3   Title  Patient will perform SLS  for 10 seconds on bil LE to indicate improved glut med strangth and improved balance for stair mobility and gait.    Time  4    Period  Weeks    Status  On-going    Target Date  02/11/19      PT SHORT TERM GOAL #4   Title  Patient will ambulate at 0.8 m/s or faster with LRAD during 2 MWT to indicate improved endurance and gait.    Time  4    Period  Weeks    Status  On-going    Target Date  02/11/19        PT Long Term Goals - 01/24/19 1616      PT LONG TERM GOAL #1   Title  Patient will ambulate at 0.8 m/s or faster with LRAD during 6 MWT to indicate improved endurance and gait.    Time  8    Period  Weeks    Status  On-going      PT LONG TERM GOAL #2   Title  Patient will ascend/descend 4 6" stairs with no hand rail at independent level with no overt LOB.    Time  8    Period  Weeks    Status  On-going      PT LONG TERM GOAL #3   Title  Patient will report no low back or posterior leg pain for 2 week period that is above 3/10 indicating improved activity tolerance.    Time  8    Period  Weeks    Status  On-going        Plan - 02/03/19 1617    Clinical Impression Statement  Therapy session focused on LE strengthening this date with standing exercises at start of session. Patient was limited in lunges by weak hip muscles and had hip internal rotation and adduction on posterior LE, he required tactile cue to facilitate knee flexion on post leg. Patient initiated machine based exercises with greatest difficulty noted on single hamstring curl, pt required cue to deter using both LE's during exercise. Ladder drill was performed at EOS for step training to facilitate equal step length. He was unsteady throughout and Rt LE tended to step in adduction and internal rotated position. He will continue to benefit form skilled PT interventions to address impairments and progress strength for improved mobility and  QOL.    Personal Factors and Comorbidities  Fitness;Comorbidity 2     Comorbidities  DM, HTN, recent sepsis with prolonged hospitalization and weight loss    Examination-Activity Limitations  Bathing;Stairs;Stand;Lift;Locomotion Level    Examination-Participation Restrictions  Laundry;Yard Work;Driving;Community Activity;Cleaning    Stability/Clinical Decision Making  Stable/Uncomplicated    Rehab Potential  Good    PT Frequency  3x / week    PT Duration  8 weeks    PT Treatment/Interventions  ADLs/Self Care Home Management;Aquatic Therapy;Cryotherapy;Electrical Stimulation;Moist Heat;Gait training;DME Instruction;Stair training;Functional mobility training;Therapeutic activities;Therapeutic exercise;Balance training;Neuromuscular re-education;Patient/family education;Manual techniques;Passive range of motion;Dry needling;Taping    PT Next Visit Plan  Continue to increase LE strengthening and balance.  Add theraband with sidestep when appropriate. Continue with machines for strengthening. Progress balance and challenge as able. Next session try gait without AD.    PT Home Exercise Plan  Eval: clamshell with band, seated hamstring stretch  6/16:  standing heelraises, toeraises    Consulted and Agree with Plan of Care  Patient       Patient will benefit from skilled therapeutic intervention in order to improve the following deficits and impairments:  Abnormal gait, Decreased activity tolerance, Decreased balance, Decreased mobility, Difficulty walking, Decreased endurance, Decreased coordination, Decreased strength, Pain, Postural dysfunction, Impaired flexibility  Visit Diagnosis: 1. Muscle weakness (generalized)   2. Difficulty in walking, not elsewhere classified        Problem List Patient Active Problem List   Diagnosis Date Noted  . Diabetic retinopathy of both eyes ([place]) 01/31/2019  . Type 2 diabetes mellitus with hyperglycemia, with long-term current use of insulin ([place]) 10/20/2018  . Sepsis due to Streptococcus, group B ([place]) 10/05/2018  . Dietary  counseling and surveillance 09/15/2018  . Non compliance with medical treatment 09/15/2018  . Open wound of right great toe   . Septic arthritis of left sternoclavicular joint ([place])   . Mediastinal abscess ([place]) 08/27/2018  . Cellulitis of great toe, right   . Diabetic hyperosmolar non-ketotic state ([place]) 08/17/2018  . Hyperglycemia 08/17/2018  . AKI (acute kidney injury) ([place]) 08/16/2018  . Hyponatremia 08/16/2018  . Hyperlipidemia associated with type 2 diabetes mellitus ([place]) 03/04/2017  . Erectile dysfunction 03/04/2017  . Personal history of noncompliance with medical treatment, presenting hazards to health 11/22/2015  . HTN (hypertension), benign 01/30/2014  . Loss of weight 01/30/2014  . Type 2 diabetes mellitus not at goal [facility]) 03/30/2013    Kipp Brood, PT, DPT, [facility] Physical Therapist with [facility]  02/03/2019 4:24 PM    [place], [place], [address] Phone: [phone]   Fax:  [phone]  Name: Randy Oneal MRN: [id number] Date of Birth: 1965/11/11

## 2019-02-04 ENCOUNTER — Other Ambulatory Visit: Payer: Self-pay

## 2019-02-04 ENCOUNTER — Ambulatory Visit (INDEPENDENT_AMBULATORY_CARE_PROVIDER_SITE_OTHER): Payer: Managed Care, Other (non HMO) | Admitting: Ophthalmology

## 2019-02-04 DIAGNOSIS — H35033 Hypertensive retinopathy, bilateral: Secondary | ICD-10-CM

## 2019-02-04 DIAGNOSIS — H3581 Retinal edema: Secondary | ICD-10-CM

## 2019-02-04 DIAGNOSIS — Z961 Presence of intraocular lens: Secondary | ICD-10-CM

## 2019-02-04 DIAGNOSIS — E113513 Type 2 diabetes mellitus with proliferative diabetic retinopathy with macular edema, bilateral: Secondary | ICD-10-CM

## 2019-02-04 DIAGNOSIS — I10 Essential (primary) hypertension: Secondary | ICD-10-CM | POA: Diagnosis not present

## 2019-02-04 DIAGNOSIS — H4313 Vitreous hemorrhage, bilateral: Secondary | ICD-10-CM | POA: Diagnosis not present

## 2019-02-05 ENCOUNTER — Telehealth: Payer: Self-pay | Admitting: Family Medicine

## 2019-02-05 ENCOUNTER — Encounter (INDEPENDENT_AMBULATORY_CARE_PROVIDER_SITE_OTHER): Payer: Self-pay | Admitting: Ophthalmology

## 2019-02-05 NOTE — Telephone Encounter (Signed)
Disability form filled out forwarded to [facility]

## 2019-02-05 NOTE — Telephone Encounter (Signed)
I filled out the disability form as best as I could.  Should this patient end up needing to have eye surgery and taken out of work longer he will need to do his additional FMLA and disability papers through his ophthalmologist Dr. Coralyn Pear

## 2019-02-07 ENCOUNTER — Other Ambulatory Visit: Payer: Self-pay

## 2019-02-07 ENCOUNTER — Ambulatory Visit (HOSPITAL_COMMUNITY): Payer: Managed Care, Other (non HMO)

## 2019-02-07 ENCOUNTER — Encounter (HOSPITAL_COMMUNITY): Payer: Self-pay

## 2019-02-07 DIAGNOSIS — R262 Difficulty in walking, not elsewhere classified: Secondary | ICD-10-CM

## 2019-02-07 DIAGNOSIS — M6281 Muscle weakness (generalized): Secondary | ICD-10-CM | POA: Diagnosis not present

## 2019-02-07 NOTE — Therapy (Signed)
[place], [place], [address] Phone: [phone]   Fax:  [phone]  Physical Therapy Treatment  Patient Details  Name: Randy Oneal MRN: [id number] Date of Birth: 1966/05/19 Referring Provider (PT): Norva Karvonen, MD   Encounter Date: 02/07/2019  PT End of Session - 02/07/19 1512    Visit Number  7    Number of Visits  24    Date for PT Re-Evaluation  03/11/19    Authorization Type  [employer] Managed (20 visit limit per calander year, 0 used at eval, no auth required)    Authorization Time Period  01/21/2019-03/11/19    Authorization - Visit Number  7    Authorization - Number of Visits  20    PT Start Time  1608   pt late for apt   PT Stop Time  1654    PT Time Calculation (min)  46 min    Equipment Utilized During Treatment  Gait belt    Activity Tolerance  Patient tolerated treatment well;Patient limited by fatigue    Behavior During Therapy  [facility] for tasks assessed/performed       Past Medical History:  Diagnosis Date  . Diabetes mellitus    Insulin dependent  . Diabetic retinopathy of both eyes ([place]) 01/31/2019   Dr Coralyn Pear 01/2019  . Hypertension   . Renal insufficiency   . Sepsis due to Streptococcus, group B ([place]) 10/05/2018    Past Surgical History:  Procedure Laterality Date  . APPLICATION OF WOUND VAC Left 08/27/2018   Procedure: APPLICATION OF WOUND VAC, left neck;  Surgeon: Gaye Pollack, MD;  Location: [place];  Service: Thoracic;  Laterality: Left;  . APPLICATION OF WOUND VAC Left 08/30/2018   Procedure: APPLICATION OF WOUND VAC;  Surgeon: Gaye Pollack, MD;  Location: [place];  Service: Thoracic;  Laterality: Left;  . COLONOSCOPY  06/24/2011   Procedure: COLONOSCOPY;  Surgeon: [name], MD;  [facility];  Service: Endoscopy;  Laterality: N/A;  8:30 AM  . STERNAL WOUND DEBRIDEMENT Left 08/27/2018   Procedure: Incision and DEBRIDEMENT Left Chest, Neck and Mediastinum;   Surgeon: Gaye Pollack, MD;  Location: [facility] OR;  Service: Thoracic;  Laterality: Left;  . STERNAL WOUND DEBRIDEMENT Left 08/30/2018   Procedure: WOUND VAC CHANGE, LEFT CHEST AND NECK, POSSIBLE DEBRIDEMENT;  Surgeon: Gaye Pollack, MD;  Location: [place];  Service: Thoracic;  Laterality: Left;    There were no vitals filed for this visit.  Subjective Assessment - 02/07/19 1512    Subjective  Pt stated he is feeling good today, no reports of pain.    Pertinent History  Mr. Crehan presented to [facility] on 08/17/18 with signs of sepsis. He was transferred to [facility] where he underwent surgery on 08/27/18 for Lt sternoclavicular infection with abscess to Lt neck and mediastinum. The wound was debrided and wound vac changed again on 08/30/18. Pt was discharged on 09/05/18 with home care to manage wound vac and IV line. On 01/19/19 patient had his final follow up with his surgeon, Gaye Pollack, MD, and was discharged as wound was fully healed and no signs of infection were present any longer. He is to follow up with his PCP no, Sallee Lange, MD.    Currently in Pain?  No/denies                       [facility] Adult PT Treatment/Exercise -  02/07/19 0001      Knee/Hip Exercises: Aerobic   Nustep  UE/LE reciprocal pattern x 29min to improve sequence wiht gait      Knee/Hip Exercises: Machines for Strengthening   Total Gym Leg Press  BodyCraft Leg Press: 2x 10 reps, 30lbs      Knee/Hip Exercises: Standing   Heel Raises  Both;Limitations;2 sets;15 reps    Heel Raises Limitations  2x 15 reps, on decline, 3 sec holds    Forward Lunges  Both;2 sets;10 reps    Forward Lunges Limitations  4" step, bil UE support,a    Lateral Step Up  Both;10 reps;Hand Hold: 2;Step Height: 4"    Lateral Step Up Limitations  hip hike    Functional Squat  15 reps    Functional Squat Limitations  front of chair for form    Rocker Board  2 minutes    Rocker Board Limitations  lateral and DF/PF    Gait Training  226ft without  AD, multiple LOB    Other Standing Knee Exercises  1RT forward steppage with no AD, too challenging     Other Standing Knee Exercises  sidestepping down blue line with RTB, no AD                PT Short Term Goals - 01/24/19 1616      PT SHORT TERM GOAL #1   Title  Patient will be independent with HEP, updated PRN, to improve functional strength to improve mobility and return to PLOF.    Baseline  initiated HEP at evaluation    Time  2    Period  Weeks    Status  On-going    Target Date  02/04/19      PT SHORT TERM GOAL #2   Title  Patient will improve LE strength by 1 grade with MMT to indicate significant improvement in strength.    Baseline  significant weakness in bil LE's especially to gluteus medius and hamstrings    Time  4    Period  Weeks    Status  On-going    Target Date  02/11/19      PT SHORT TERM GOAL #3   Title  Patient will perform SLS for 10 seconds on bil LE to indicate improved glut med strangth and improved balance for stair mobility and gait.    Time  4    Period  Weeks    Status  On-going    Target Date  02/11/19      PT SHORT TERM GOAL #4   Title  Patient will ambulate at 0.8 m/s or faster with LRAD during 2 MWT to indicate improved endurance and gait.    Time  4    Period  Weeks    Status  On-going    Target Date  02/11/19        PT Long Term Goals - 01/24/19 1616      PT LONG TERM GOAL #1   Title  Patient will ambulate at 0.8 m/s or faster with LRAD during 6 MWT to indicate improved endurance and gait.    Time  8    Period  Weeks    Status  On-going      PT LONG TERM GOAL #2   Title  Patient will ascend/descend 4 6" stairs with no hand rail at independent level with no overt LOB.    Time  8    Period  Weeks    Status  On-going      PT LONG TERM GOAL #3   Title  Patient will report no low back or posterior leg pain for 2 week period that is above 3/10 indicating improved activity tolerance.    Time  8    Period  Weeks     Status  On-going            Plan - 02/07/19 1706    Clinical Impression Statement  Began gait training without AD, pt with tendency to flat foot, NBOS and no UE movements wiht multiple LOB episodes requiring min A from therapist.  Pt educated on UE/LE sequence, added Nustep for short duration with vast improvements with sequence following.  Noted some trendelenburg gait due to gluteal weakness.  Increase focus on gluteal strengthening with theraband added to sidestep, added hip hikes and continues with leg press machine.  Unable to complete all LE machines due to time.  EOS pt limited by fatigue, no reports of pain.  Pt encouraged to continue use of SPC during all gait at this time.    Personal Factors and Comorbidities  Fitness;Comorbidity 2    Comorbidities  DM, HTN, recent sepsis with prolonged hospitalization and weight loss    Examination-Activity Limitations  Bathing;Stairs;Stand;Lift;Locomotion Level    Examination-Participation Restrictions  Laundry;Yard Work;Driving;Community Activity;Cleaning    Stability/Clinical Decision Making  Stable/Uncomplicated    Clinical Decision Making  Low    Rehab Potential  Good    PT Frequency  3x / week    PT Duration  8 weeks    PT Treatment/Interventions  ADLs/Self Care Home Management;Aquatic Therapy;Cryotherapy;Electrical Stimulation;Moist Heat;Gait training;DME Instruction;Stair training;Functional mobility training;Therapeutic activities;Therapeutic exercise;Balance training;Neuromuscular re-education;Patient/family education;Manual techniques;Passive range of motion;Dry needling;Taping    PT Next Visit Plan  Add lateral lunges next session.  Continue to increase LE strengthening and balance.  Continue with machines for strengthening. Progress balance and challenge as able. Continue gait training without AD.    PT Home Exercise Plan  Eval: clamshell with band, seated hamstring stretch  6/16:  standing heelraises, toeraises       Patient will  benefit from skilled therapeutic intervention in order to improve the following deficits and impairments:  Abnormal gait, Decreased activity tolerance, Decreased balance, Decreased mobility, Difficulty walking, Decreased endurance, Decreased coordination, Decreased strength, Pain, Postural dysfunction, Impaired flexibility  Visit Diagnosis: 1. Muscle weakness (generalized)   2. Difficulty in walking, not elsewhere classified        Problem List Patient Active Problem List   Diagnosis Date Noted  . Diabetic retinopathy of both eyes ([place]) 01/31/2019  . Type 2 diabetes mellitus with hyperglycemia, with long-term current use of insulin ([place]) 10/20/2018  . Sepsis due to Streptococcus, group B ([place]) 10/05/2018  . Dietary counseling and surveillance 09/15/2018  . Non compliance with medical treatment 09/15/2018  . Open wound of right great toe   . Septic arthritis of left sternoclavicular joint ([place])   . Mediastinal abscess ([place]) 08/27/2018  . Cellulitis of great toe, right   . Diabetic hyperosmolar non-ketotic state ([place]) 08/17/2018  . Hyperglycemia 08/17/2018  . AKI (acute kidney injury) ([place]) 08/16/2018  . Hyponatremia 08/16/2018  . Hyperlipidemia associated with type 2 diabetes mellitus ([place]) 03/04/2017  . Erectile dysfunction 03/04/2017  . Personal history of noncompliance with medical treatment, presenting hazards to health 11/22/2015  . HTN (hypertension), benign 01/30/2014  . Loss of weight 01/30/2014  . Type 2 diabetes mellitus not at goal [facility]) 03/30/2013   Ihor Austin, [place]; CBIS [phone]  Aldona Lento 02/07/2019, 5:14 PM  [place] [address] [place], [place], [address] Phone: [phone]   Fax:  [phone]  Name: Gadiel John Oneal MRN: [id number] Date of Birth: 03-03-1966

## 2019-02-08 ENCOUNTER — Ambulatory Visit (HOSPITAL_COMMUNITY): Payer: Managed Care, Other (non HMO)

## 2019-02-08 ENCOUNTER — Other Ambulatory Visit: Payer: Self-pay

## 2019-02-08 ENCOUNTER — Encounter (HOSPITAL_COMMUNITY): Payer: Self-pay

## 2019-02-08 DIAGNOSIS — R262 Difficulty in walking, not elsewhere classified: Secondary | ICD-10-CM

## 2019-02-08 DIAGNOSIS — M6281 Muscle weakness (generalized): Secondary | ICD-10-CM

## 2019-02-08 NOTE — Patient Instructions (Signed)
ABDUCTION: Standing (Active)    Stand, feet flat. Tie theraband around both thighs.  Lift right leg out to side, keeping toes pointing forward. Complete 2 sets of 10 repetitions. Perform 4 sessions per day.  http://gtsc.exer.us/110   Copyright  VHI. All rights reserved.   Hip Extension (Standing)    Stand with support front of sink/counter.  Tie theraband around thighs Squeeze pelvic floor and hold. Move right leg backward with straight knee. Hold for 3 seconds.  Repeat 10 times. Do 2 times a day, 4 days a week Repeat with other leg.   Copyright  VHI. All rights reserved.

## 2019-02-08 NOTE — Therapy (Signed)
[place], [place], [address] Phone: [phone]   Fax:  [phone]  Physical Therapy Treatment  Patient Details  Name: Randy Oneal MRN: [id number] Date of Birth: 13-Mar-1966 Referring Provider (PT): Norva Karvonen, MD   Encounter Date: 02/08/2019  PT End of Session - 02/08/19 1410    Visit Number  8    Number of Visits  24    Date for PT Re-Evaluation  03/11/19   Minireassess 02/11/19   Authorization Type  [employer] (20 visit limit per calander year, 0 used at eval, no auth required)    Authorization Time Period  01/21/2019-03/11/19    Authorization - Visit Number  8    Authorization - Number of Visits  20    PT Start Time  6761   pt late for apt   PT Stop Time  1452    PT Time Calculation (min)  45 min    Equipment Utilized During Treatment  Gait belt    Activity Tolerance  Patient tolerated treatment well;Patient limited by fatigue    Behavior During Therapy  [facility] for tasks assessed/performed       Past Medical History:  Diagnosis Date  . Diabetes mellitus    Insulin dependent  . Diabetic retinopathy of both eyes ([place]) 01/31/2019   Dr Coralyn Pear 01/2019  . Hypertension   . Renal insufficiency   . Sepsis due to Streptococcus, group B ([place]) 10/05/2018    Past Surgical History:  Procedure Laterality Date  . APPLICATION OF WOUND VAC Left 08/27/2018   Procedure: APPLICATION OF WOUND VAC, left neck;  Surgeon: Gaye Pollack, MD;  Location: [place];  Service: Thoracic;  Laterality: Left;  . APPLICATION OF WOUND VAC Left 08/30/2018   Procedure: APPLICATION OF WOUND VAC;  Surgeon: Gaye Pollack, MD;  Location: [place];  Service: Thoracic;  Laterality: Left;  . COLONOSCOPY  06/24/2011   Procedure: COLONOSCOPY;  Surgeon: [name], MD;  [facility];  Service: Endoscopy;  Laterality: N/A;  8:30 AM  . STERNAL WOUND DEBRIDEMENT Left 08/27/2018   Procedure: Incision and DEBRIDEMENT Left Chest,  Neck and Mediastinum;  Surgeon: Gaye Pollack, MD;  Location: [facility] OR;  Service: Thoracic;  Laterality: Left;  . STERNAL WOUND DEBRIDEMENT Left 08/30/2018   Procedure: WOUND VAC CHANGE, LEFT CHEST AND NECK, POSSIBLE DEBRIDEMENT;  Surgeon: Gaye Pollack, MD;  Location: [place];  Service: Thoracic;  Laterality: Left;    There were no vitals filed for this visit.  Subjective Assessment - 02/08/19 1403    Subjective  Pt stated he was a little sore following PT session yesterday, no real pain to mention.    Pertinent History  Randy Oneal presented to [facility] on 08/17/18 with signs of sepsis. He was transferred to [facility] where he underwent surgery on 08/27/18 for Lt sternoclavicular infection with abscess to Lt neck and mediastinum. The wound was debrided and wound vac changed again on 08/30/18. Pt was discharged on 09/05/18 with home care to manage wound vac and IV line. On 01/19/19 patient had his final follow up with his surgeon, Gaye Pollack, MD, and was discharged as wound was fully healed and no signs of infection were present any longer. He is to follow up with his PCP no, Sallee Lange, MD.    Currently in Pain?  No/denies  [facility] Adult PT Treatment/Exercise - 02/08/19 0001      Knee/Hip Exercises: Machines for Strengthening   Cybex Knee Extension  2x 10 reps with 1 plate (74#)    Cybex Knee Flexion  2 x 10 reps, plate 4 (08XKG), (2nd set 4.5 Pl- 40.5#) bil LE    Total Gym Leg Press  BodyCraft Leg Press: 2x 10 reps, 30lbs      Knee/Hip Exercises: Standing   Heel Raises  Both;Limitations;2 sets;15 reps    Heel Raises Limitations  2x 15 reps, on decline, 3 sec holds    Forward Lunges  Both;15 reps    Forward Lunges Limitations  4" step, bil UE support,a    Side Lunges  Both;15 reps    Side Lunges Limitations  4in step, BUE A    Lateral Step Up  Both;10 reps;Hand Hold: 2;Step Height: 4"    Lateral Step Up Limitations  hip hike    Functional Squat  15 reps     Functional Squat Limitations  front of chair for form    Gait Training  261ft without AD, multiple LOB    Other Standing Knee Exercises  2RT sidestepping down blue line with RTB, no AD                PT Short Term Goals - 01/24/19 1616      PT SHORT TERM GOAL #1   Title  Patient will be independent with HEP, updated PRN, to improve functional strength to improve mobility and return to PLOF.    Baseline  initiated HEP at evaluation    Time  2    Period  Weeks    Status  On-going    Target Date  02/04/19      PT SHORT TERM GOAL #2   Title  Patient will improve LE strength by 1 grade with MMT to indicate significant improvement in strength.    Baseline  significant weakness in bil LE's especially to gluteus medius and hamstrings    Time  4    Period  Weeks    Status  On-going    Target Date  02/11/19      PT SHORT TERM GOAL #3   Title  Patient will perform SLS for 10 seconds on bil LE to indicate improved glut med strangth and improved balance for stair mobility and gait.    Time  4    Period  Weeks    Status  On-going    Target Date  02/11/19      PT SHORT TERM GOAL #4   Title  Patient will ambulate at 0.8 m/s or faster with LRAD during 2 MWT to indicate improved endurance and gait.    Time  4    Period  Weeks    Status  On-going    Target Date  02/11/19        PT Long Term Goals - 01/24/19 1616      PT LONG TERM GOAL #1   Title  Patient will ambulate at 0.8 m/s or faster with LRAD during 6 MWT to indicate improved endurance and gait.    Time  8    Period  Weeks    Status  On-going      PT LONG TERM GOAL #2   Title  Patient will ascend/descend 4 6" stairs with no hand rail at independent level with no overt LOB.    Time  8    Period  Weeks  Status  On-going      PT LONG TERM GOAL #3   Title  Patient will report no low back or posterior leg pain for 2 week period that is above 3/10 indicating improved activity tolerance.    Time  8    Period  Weeks     Status  On-going            Plan - 02/08/19 1511    Clinical Impression Statement  Continued with established POC for hip/LE strengthening and balance training.  Added side lunges for gluteal strengthening wiht min cueing required for form/mechanics to reduce IR/adduction due to weakness.  Continued with functional strengthening exercises and machines for glutes, hamstring and quads with cueing for control.  Pt required close contact with gait training for LOB episodes, improved knowledge for appropriate UE/LE sequence, when mistaken movements pt with good demonstrating of stopping, pausing, verbalizing then attempt again safely.  EOS pt reports some fatigue, no reoprts of pain through session.    Personal Factors and Comorbidities  Fitness;Comorbidity 2    Comorbidities  DM, HTN, recent sepsis with prolonged hospitalization and weight loss    Examination-Activity Limitations  Bathing;Stairs;Stand;Lift;Locomotion Level    Examination-Participation Restrictions  Laundry;Yard Work;Driving;Community Activity;Cleaning    Stability/Clinical Decision Making  Stable/Uncomplicated    Clinical Decision Making  Low    Rehab Potential  Good    PT Frequency  3x / week    PT Duration  8 weeks    PT Treatment/Interventions  ADLs/Self Care Home Management;Aquatic Therapy;Cryotherapy;Electrical Stimulation;Moist Heat;Gait training;DME Instruction;Stair training;Functional mobility training;Therapeutic activities;Therapeutic exercise;Balance training;Neuromuscular re-education;Patient/family education;Manual techniques;Passive range of motion;Dry needling;Taping    PT Next Visit Plan  Continue to increase LE strengthening and balance.  Continue with machines for strengthening. Progress balance and challenge as able. Continue gait training without AD.    PT Home Exercise Plan  Eval: clamshell with band, seated hamstring stretch  6/16:  standing heelraises, toeraises; 02/08/19: standing hip abd/ext infront of  sink/counter with RTB around thigh       Patient will benefit from skilled therapeutic intervention in order to improve the following deficits and impairments:  Abnormal gait, Decreased activity tolerance, Decreased balance, Decreased mobility, Difficulty walking, Decreased endurance, Decreased coordination, Decreased strength, Pain, Postural dysfunction, Impaired flexibility  Visit Diagnosis: 1. Muscle weakness (generalized)   2. Difficulty in walking, not elsewhere classified        Problem List Patient Active Problem List   Diagnosis Date Noted  . Diabetic retinopathy of both eyes ([place]) 01/31/2019  . Type 2 diabetes mellitus with hyperglycemia, with long-term current use of insulin ([place]) 10/20/2018  . Sepsis due to Streptococcus, group B ([place]) 10/05/2018  . Dietary counseling and surveillance 09/15/2018  . Non compliance with medical treatment 09/15/2018  . Open wound of right great toe   . Septic arthritis of left sternoclavicular joint ([place])   . Mediastinal abscess ([place]) 08/27/2018  . Cellulitis of great toe, right   . Diabetic hyperosmolar non-ketotic state ([place]) 08/17/2018  . Hyperglycemia 08/17/2018  . AKI (acute kidney injury) ([place]) 08/16/2018  . Hyponatremia 08/16/2018  . Hyperlipidemia associated with type 2 diabetes mellitus ([place]) 03/04/2017  . Erectile dysfunction 03/04/2017  . Personal history of noncompliance with medical treatment, presenting hazards to health 11/22/2015  . HTN (hypertension), benign 01/30/2014  . Loss of weight 01/30/2014  . Type 2 diabetes mellitus not at goal [facility]) 03/30/2013   Ihor Austin, [place]; [place]  Aldona Lento 02/08/2019, 3:21 PM  [facility]  [place], [place], [address] Phone: [phone]   Fax:  [phone]  Name: Randy Oneal MRN: [id number] Date of Birth: July 15, 1966

## 2019-02-09 ENCOUNTER — Telehealth (INDEPENDENT_AMBULATORY_CARE_PROVIDER_SITE_OTHER): Payer: Self-pay

## 2019-02-09 ENCOUNTER — Ambulatory Visit (INDEPENDENT_AMBULATORY_CARE_PROVIDER_SITE_OTHER): Payer: Managed Care, Other (non HMO) | Admitting: Ophthalmology

## 2019-02-09 ENCOUNTER — Encounter (INDEPENDENT_AMBULATORY_CARE_PROVIDER_SITE_OTHER): Payer: Self-pay | Admitting: Ophthalmology

## 2019-02-09 DIAGNOSIS — H4313 Vitreous hemorrhage, bilateral: Secondary | ICD-10-CM | POA: Diagnosis not present

## 2019-02-09 DIAGNOSIS — I10 Essential (primary) hypertension: Secondary | ICD-10-CM

## 2019-02-09 DIAGNOSIS — H3343 Traction detachment of retina, bilateral: Secondary | ICD-10-CM | POA: Diagnosis not present

## 2019-02-09 DIAGNOSIS — Z961 Presence of intraocular lens: Secondary | ICD-10-CM

## 2019-02-09 DIAGNOSIS — E113513 Type 2 diabetes mellitus with proliferative diabetic retinopathy with macular edema, bilateral: Secondary | ICD-10-CM | POA: Diagnosis not present

## 2019-02-09 DIAGNOSIS — H35033 Hypertensive retinopathy, bilateral: Secondary | ICD-10-CM

## 2019-02-09 DIAGNOSIS — H3581 Retinal edema: Secondary | ICD-10-CM | POA: Diagnosis not present

## 2019-02-09 MED ORDER — BEVACIZUMAB CHEMO INJECTION 1.25MG/0.05ML SYRINGE FOR KALEIDOSCOPE
1.2500 mg | INTRAVITREAL | Status: AC | PRN
  Administered 2019-02-09: 1.25 mg via INTRAVITREAL

## 2019-02-09 NOTE — Progress Notes (Signed)
[facility] Note  02/09/2019     CHIEF COMPLAINT Patient presents for Retina Follow Up   HISTORY OF PRESENT ILLNESS: Randy Oneal is a 53 y.o. male who presents to the clinic today for:   HPI    Retina Follow Up    Patient presents with  Diabetic Retinopathy.  In both eyes.  Severity is severe.  Duration of 4 days.  Since onset it is stable.  I, the attending physician,  performed the HPI with the patient and updated documentation appropriately.          Comments    Patient states vision the same OU. Not taking any eye gtts at this time. BS was 214 this am.        Last edited by Bernarda Caffey, MD on 02/09/2019 11:03 PM. (History)      Referring physician: [name], MD [address] [place],  [place] [address]  HISTORICAL INFORMATION:   Selected notes from the MEDICAL RECORD NUMBER Referred by Dr.Mark Gershon Crane for concern of decreased vision post cataract sx LEE:  Ocular Hx- PMH-   CURRENT MEDICATIONS: Current Outpatient Medications (Ophthalmic Drugs)  Medication Sig  . ketorolac (ACULAR) 0.5 % ophthalmic solution Place 2 drops into both eyes 3 (three) times daily.  Marland Kitchen ofloxacin (OCUFLOX) 0.3 % ophthalmic solution   . prednisoLONE acetate (PRED FORTE) 1 % ophthalmic suspension Place 2 drops into both eyes 3 (three) times daily.   No current facility-administered medications for this visit.  (Ophthalmic Drugs)   Current Outpatient Medications (Other)  Medication Sig  . amLODipine (NORVASC) 5 MG tablet Take one tablet by mouth each day  . atorvastatin (LIPITOR) 10 MG tablet TAKE ONE TABLET BY MOUTH ONCE DAILY.  Marland Kitchen Continuous Blood Gluc Sensor (FREESTYLE LIBRE SENSOR SYSTEM) MISC Use one sensor every 10 days.  . furosemide (LASIX) 40 MG tablet Take 1 tablet (40 mg total) by mouth daily as needed. Take for weight gain of 3-5 lbs  . glucose blood (BAYER CONTOUR NEXT TEST) test strip USE TO TEST FOUR TIMES DAILY.  Marland Kitchen insulin  lispro (HUMALOG KWIKPEN) 100 UNIT/ML KwikPen Inject 0.06 mLs (6 Units total) into the skin daily with breakfast AND 0.07 mLs (7 Units total) 2 (two) times daily before lunch and supper.  . Insulin Pen Needle (BD PEN NEEDLE NANO 2ND GEN) 32G X 4 MM MISC Four times daily  . lisinopril (PRINIVIL,ZESTRIL) 10 MG tablet   . metoprolol tartrate (LOPRESSOR) 50 MG tablet Take one half tablet by mouth twice daily  . MICROLET LANCETS MISC USE FOUR TIMES A DAY AS DIRECTED.  Marland Kitchen nystatin (MYCOSTATIN/NYSTOP) powder Apply topically 2 (two) times daily. To groin and scrotum  . pantoprazole (PROTONIX) 40 MG tablet Take 1 tablet (40 mg total) by mouth daily.  . sildenafil (REVATIO) 20 MG tablet May take up to 5 before relations as directed  . TRESIBA FLEXTOUCH 100 UNIT/ML SOPN FlexTouch Pen Inject 0.19 mLs (19 Units total) into the skin at bedtime.  . potassium chloride SA (K-DUR,KLOR-CON) 20 MEQ tablet Take 1 tablet (20 mEq total) by mouth daily as needed. Only take on days you use Lasix (Patient not taking: Reported on 02/09/2019)   No current facility-administered medications for this visit.  (Other)      REVIEW OF SYSTEMS: ROS    Positive for: Endocrine, Eyes   Negative for: Constitutional, Gastrointestinal, Neurological, Skin, Genitourinary, Musculoskeletal, HENT, Cardiovascular, Respiratory, Psychiatric, Allergic/Imm, Heme/Lymph   Last edited  by Roselee Nova D on 02/09/2019  2:05 PM. (History)       ALLERGIES Allergies  Allergen Reactions  . Tramadol Nausea And Vomiting    Nausea and vomiting    PAST MEDICAL HISTORY Past Medical History:  Diagnosis Date  . Diabetes mellitus    Insulin dependent  . Diabetic retinopathy of both eyes ([employer]) 01/31/2019   Dr Coralyn Pear 01/2019  . Hypertension   . Renal insufficiency   . Sepsis due to Streptococcus, group B ([place]) 10/05/2018   Past Surgical History:  Procedure Laterality Date  . APPLICATION OF WOUND VAC Left 08/27/2018   Procedure: APPLICATION OF WOUND  VAC, left neck;  Surgeon: Gaye Pollack, MD;  Location: [facility];  Service: Thoracic;  Laterality: Left;  . APPLICATION OF WOUND VAC Left 08/30/2018   Procedure: APPLICATION OF WOUND VAC;  Surgeon: Gaye Pollack, MD;  Location: [facility];  Service: Thoracic;  Laterality: Left;  . COLONOSCOPY  06/24/2011   Procedure: COLONOSCOPY;  Surgeon: [name], MD;  Location: [facility];  Service: Endoscopy;  Laterality: N/A;  8:30 AM  . STERNAL WOUND DEBRIDEMENT Left 08/27/2018   Procedure: Incision and DEBRIDEMENT Left Chest, Neck and Mediastinum;  Surgeon: Gaye Pollack, MD;  Location: [facility] OR;  Service: Thoracic;  Laterality: Left;  . STERNAL WOUND DEBRIDEMENT Left 08/30/2018   Procedure: WOUND VAC CHANGE, LEFT CHEST AND NECK, POSSIBLE DEBRIDEMENT;  Surgeon: Gaye Pollack, MD;  Location: [facility] OR;  Service: Thoracic;  Laterality: Left;    FAMILY HISTORY Family History  Problem Relation Age of Onset  . Hypertension Mother   . Colon cancer Neg Hx     SOCIAL HISTORY Social History   Tobacco Use  . Smoking status: Never Smoker  . Smokeless tobacco: Never Used  Substance Use Topics  . Alcohol use: No  . Drug use: No         OPHTHALMIC EXAM:  Base Eye Exam    Visual Acuity (Snellen - Linear)      Right Left   Dist [place] CF@face  20/80 +2   Dist ph [place] NI NI       Tonometry (Tonopen, 2:15 PM)      Right Left   Pressure 12 13       Pupils      Dark Light Shape React APD   Right 3 2.5 Round Minimal None   Left 3 2 Round Slow None       Visual Fields (Counting fingers)      Left Right    Full    Restrictions  Total superior temporal, inferior temporal, superior nasal, inferior nasal deficiencies       Extraocular Movement      Right Left    Full, Ortho Full, Ortho       Neuro/Psych    Oriented x3: Yes   Mood/Affect: Normal       Dilation    Both eyes: 1.0% Mydriacyl, 2.5% Phenylephrine @ 2:15 PM        Slit Lamp and Fundus Exam    Slit Lamp Exam      Right Left    Lids/Lashes mild Meibomian gland dysfunction mild Meibomian gland dysfunction   Conjunctiva/Sclera White and quiet White and quiet   Cornea mild Arcus, 1+ Descemet's folds, Well healed temporal cataract wounds mild Arcus, 2+ Descemet's folds, Well healed temporal cataract wounds   Anterior Chamber 4+pigment Deep, 3-4+pigment   Iris slightly irregular, dilated Round and dilated  Lens PC IOL in good position PC IOL in good position   Vitreous Vitreous syneresis, +cell/pigment in anterior vitreous, diffuse vitreous Hemorrhage - clearing, vitreous condensations/traction posteriorly Vitreous syneresis, +cell/pigment in anterior vitreous, diffuse vitreous Hemorrhage settling inferiorly, Posterior vitreous detachment, vitreous condensations       Fundus Exam      Right Left   Disc obscured by NVD, fibrosis +fibrosis, regressing NVD?   Macula hazy view; macula detached in wolf-jaw TRD -- extensive fibrosis Blunted foveal reflex, tractional fibrosis along IT arcades, Focal TRD with retinal tear along distal ST arcade   Vessels attenuated; tortuous; +NV w/ extensive fibrosis attenuated; fibrosis along IT arcades   Periphery Hazy view; periphery attached, macular TRD extending into midzone; peripapillary sub retinal heme nasal and inferior; old VH inferior red reflex; grossly attached, old VH settling inferiorly, light PRP laser changes--room for fill in, scattered fibrosis          IMAGING AND PROCEDURES  Imaging and Procedures for @TODAY @  OCT, Retina - OU - Both Eyes       Right Eye Quality was poor. Central Foveal Thickness: 979. Progression has no prior data. Findings include preretinal fibrosis (Poor image; +TRD of posterior pole).   Left Eye Quality was poor. Central Foveal Thickness: 427. Progression has been stable. Findings include vitreous traction, preretinal fibrosis, intraretinal fluid, macular pucker, abnormal foveal contour, intraretinal hyper-reflective material, epiretinal  membrane, subretinal fluid (Focal TRD along distal ST arcade -- stable from prior).   Notes *Images captured and stored on drive  Diagnosis / Impression:  OD: Poor image; +TRD of posterior pole OS: Persistent vitreous opacities; Focal TRD along distal ST arcade -- stable from prior   Clinical management:  See below  Abbreviations: NFP - Normal foveal profile. CME - cystoid macular edema. PED - pigment epithelial detachment. IRF - intraretinal fluid. SRF - subretinal fluid. EZ - ellipsoid zone. ERM - epiretinal membrane. ORA - outer retinal atrophy. ORT - outer retinal tubulation. SRHM - subretinal hyper-reflective material        Intravitreal Injection, Pharmacologic Agent - OD - Right Eye       Time Out 02/09/2019. 3:58 PM. Confirmed correct patient, procedure, site, and patient consented.   Anesthesia Topical anesthesia was used. Anesthetic medications included Lidocaine 2%, Proparacaine 0.5%.   Procedure Preparation included 5% betadine to ocular surface, eyelid speculum. A supplied needle was used.   Injection:  1.25 mg Bevacizumab (AVASTIN) SOLN   NDC: 53664-403-47, Lot: [id number] , Expiration date: 03/23/2019   Route: Intravitreal, Site: Right Eye, Waste: 0 mL  Post-op Post injection exam found visual acuity of at least counting fingers. The patient tolerated the procedure well. There were no complications. The patient received written and verbal post procedure care education.        Intravitreal Injection, Pharmacologic Agent - OS - Left Eye       Time Out 02/09/2019. 3:59 PM. Confirmed correct patient, procedure, site, and patient consented.   Anesthesia Topical anesthesia was used. Anesthetic medications included Lidocaine 2%, Proparacaine 0.5%.   Procedure Preparation included 5% betadine to ocular surface, eyelid speculum. A supplied needle was used.   Injection:  1.25 mg Bevacizumab (AVASTIN) SOLN   NDC: 20/08/2018, Lot: [id number] , Expiration date:  03/23/2019   Route: Intravitreal, Site: Left Eye, Waste: 0 mg  Post-op Post injection exam found visual acuity of at least counting fingers. The patient tolerated the procedure well. There were no complications. The patient received written and verbal post procedure care education.  Color Fundus Photography Optos - OU - Both Eyes       Right Eye Progression has improved. Disc findings include neovascularization (Obscured by fibrosis). Macula : hemorrhage, detached (Extensive fibrosis w/ wolf-jaw detachment of macula). Vessels : tortuous vessels, attenuated, Neovascularization. Periphery : hemorrhage.   Left Eye Disc findings include neovascularization (Partially obscured by fibrosis / NVD). Macula : epiretinal membrane, detached, hemorrhage (Focal TRD w/ stretch hole along distal ST arcades). Vessels : attenuated, Neovascularization (tractional fibrosis along IT arcades). Periphery : hemorrhage, RPE abnormality (Early PRP laser chages; tractional fibrosis along IT arcades).   Notes **Images stored on drive**  Impression: OD: improved view with interval improvement in vitreous hemorrhage; wolf-jaw TRD w/ full macular detachment OS: focal TRD w/ stretch hole along distal ST arcades; tractional fibrosis along IT arcades; early peripheral PRP laser changes;                  ASSESSMENT/PLAN:    ICD-10-CM   1. Proliferative diabetic retinopathy of both eyes with macular edema associated with type 2 diabetes mellitus (HCC)  Z66.2947 Intravitreal Injection, Pharmacologic Agent - OD - Right Eye    Intravitreal Injection, Pharmacologic Agent - OS - Left Eye    Color Fundus Photography Optos - OU - Both Eyes    Bevacizumab (AVASTIN) SOLN 1.25 mg    Bevacizumab (AVASTIN) SOLN 1.25 mg  2. Retinal detachment, tractional, bilateral  H33.43 Color Fundus Photography Optos - OU - Both Eyes  3. Vitreous hemorrhage of both eyes (HCC)  H43.13 Color Fundus Photography Optos - OU - Both Eyes   4. Retinal edema  H35.81 OCT, Retina - OU - Both Eyes  5. Essential hypertension  I10   6. Hypertensive retinopathy of both eyes  H35.033   7. Pseudophakia of both eyes  Z96.1     1-4. Proliferative diabetic retinopathy with DME, TRD, and vitreous hemorrhage OU (OD > OS)  - pt with complex medical history with hospitalization in Jan-Feb 2020 for bacteremia and abscess  - history of poor glycemic control for years  - The incidence, risk factors for progression, natural history and treatment options for diabetic retinopathy were discussed with patient.    - The need for close monitoring of blood glucose, blood pressure, and serum lipids, avoiding cigarette or any type of tobacco, and the need for long term follow up was also discussed with patient.  - s/p IVA OU #1 (06.02.20)   - s/p PRP OS (06.10.20)  - BCVA OD CF from HM; OS stable at 20/80  - exam shows diffuse vitreous and preretinal hemorrhage + scattered fibrosis improving OU OU  - interval clearing of VH has improved visualization of posterior pole OU -- now can clearly see significant macular TRD OD; and ?regressing NVD OS  - OCT shows preretinal fibrosis w/ traction OU (OD > OS)  - discussed findings, guarded prognosis, and treatment options including possibility of surgery  - recommend IVA OU today, 07.01.20  - RBA of procedure discussed, questions answered  - informed consent obtained and signed  - see procedure note  - also recommend 25g PPV w/ MP, EL, and gas vs oil OD under general anesthesia for TRD repair OD  - RBA of surgery/procedures discussed, questions answered  - informed consent obtained and signed  - surgery OD scheduled for February 24, 2019 at 11:30AM [place] [facility]  - pt will need surgical clearance from PCP and Anesthesia + COVID test  - pt wishes to f/u next week  for surgical discussion with wife present  - f/u POV1 on 7.17.20  5,6. Hypertensive retinopathy OU  - discussed importance of tight BP control  -  monitor  7. Pseudophakia OU  - s/p CE/IOL OU (Dr. Gershon Crane)  - post op drops per Dr. Gershon Crane  - recommend adding NSAID QID OU -- samples of prolensa given   Ophthalmic Meds Ordered this visit:  Meds ordered this encounter  Medications  . Bevacizumab (AVASTIN) SOLN 1.25 mg  . Bevacizumab (AVASTIN) SOLN 1.25 mg       Return in 1 week (on 02/16/2019) for consult for sx.  There are no Patient Instructions on file for this visit.   Explained the diagnoses, plan, and follow up with the patient and they expressed understanding.  Patient expressed understanding of the importance of proper follow up care.   This document serves as a record of services personally performed by Gardiner Sleeper, MD, PhD. It was created on their behalf by Ernest Mallick, OA, an ophthalmic assistant. The creation of this record is the provider's dictation and/or activities during the visit.    Electronically signed by: Ernest Mallick, OA  07.01.2020 11:36 PM   Gardiner Sleeper, M.D., Ph.D. Diseases & Surgery of the Retina and Vitreous [facility]  I have reviewed the above documentation for accuracy and completeness, and I agree with the above. Gardiner Sleeper, M.D., Ph.D. 02/09/19 11:36 PM   Abbreviations: M myopia (nearsighted); A astigmatism; H hyperopia (farsighted); P presbyopia; Mrx spectacle prescription;  CTL contact lenses; OD right eye; OS left eye; OU both eyes  XT exotropia; ET esotropia; PEK punctate epithelial keratitis; PEE punctate epithelial erosions; DES dry eye syndrome; MGD meibomian gland dysfunction; ATs artificial tears; PFAT's preservative free artificial tears; [place] nuclear sclerotic cataract; PSC posterior subcapsular cataract; ERM epi-retinal membrane; PVD posterior vitreous detachment; RD retinal detachment; DM diabetes mellitus; DR diabetic retinopathy; NPDR non-proliferative diabetic retinopathy; PDR proliferative diabetic retinopathy; CSME clinically significant  macular edema; DME diabetic macular edema; dbh dot blot hemorrhages; CWS cotton wool spot; POAG primary open angle glaucoma; C/D cup-to-disc ratio; HVF humphrey visual field; GVF goldmann visual field; OCT optical coherence tomography; IOP intraocular pressure; BRVO Branch retinal vein occlusion; CRVO central retinal vein occlusion; CRAO central retinal artery occlusion; BRAO branch retinal artery occlusion; RT retinal tear; SB scleral buckle; PPV pars plana vitrectomy; VH Vitreous hemorrhage; PRP panretinal laser photocoagulation; IVK intravitreal kenalog; VMT vitreomacular traction; MH Macular hole;  NVD neovascularization of the disc; NVE neovascularization elsewhere; AREDS age related eye disease study; ARMD age related macular degeneration; POAG primary open angle glaucoma; EBMD epithelial/anterior basement membrane dystrophy; ACIOL anterior chamber intraocular lens; IOL intraocular lens; PCIOL posterior chamber intraocular lens; Phaco/IOL phacoemulsification with intraocular lens placement; [place] photorefractive keratectomy; LASIK laser assisted in situ keratomileusis; HTN hypertension; DM diabetes mellitus; COPD chronic obstructive pulmonary disease

## 2019-02-10 ENCOUNTER — Telehealth (INDEPENDENT_AMBULATORY_CARE_PROVIDER_SITE_OTHER): Payer: Self-pay | Admitting: Ophthalmology

## 2019-02-10 ENCOUNTER — Other Ambulatory Visit: Payer: Self-pay

## 2019-02-10 ENCOUNTER — Encounter (HOSPITAL_COMMUNITY): Payer: Self-pay

## 2019-02-10 ENCOUNTER — Ambulatory Visit (HOSPITAL_COMMUNITY): Payer: Managed Care, Other (non HMO) | Attending: Family Medicine

## 2019-02-10 DIAGNOSIS — R262 Difficulty in walking, not elsewhere classified: Secondary | ICD-10-CM | POA: Diagnosis present

## 2019-02-10 DIAGNOSIS — M6281 Muscle weakness (generalized): Secondary | ICD-10-CM | POA: Diagnosis not present

## 2019-02-10 NOTE — Telephone Encounter (Signed)
Advised patient's son Hurta) that redness is normal after avastin treatment OU on 07.01.2020. Redness can come from nicked blood vessel during treatment and may take 1-2 weeks to go away. Patient denies loss of vision, discharge, or pain. Will call back if more serious symptoms develop.

## 2019-02-10 NOTE — Therapy (Signed)
[place], [place], [address] Phone: [phone]   Fax:  [phone]  Physical Therapy Treatment  Patient Details  Name: Randy Oneal MRN: [id number] Date of Birth: 11/19/1965 Referring Provider (PT): Norva Karvonen, MD   Encounter Date: 02/10/2019  PT End of Session - 02/10/19 1501    Visit Number  9    Number of Visits  24    Date for PT Re-Evaluation  03/11/19   Minireassess 02/11/19   Authorization Type  [employer] (20 visit limit per calander year, 0 used at eval, no auth required)    Authorization Time Period  01/21/2019-03/11/19    Authorization - Visit Number  9    Authorization - Number of Visits  20    PT Start Time  1426    PT Stop Time  1507    PT Time Calculation (min)  41 min    Equipment Utilized During Treatment  Gait belt    Activity Tolerance  Patient tolerated treatment well;Patient limited by fatigue    Behavior During Therapy  [facility] for tasks assessed/performed       Past Medical History:  Diagnosis Date  . Diabetes mellitus    Insulin dependent  . Diabetic retinopathy of both eyes ([place]) 01/31/2019   Dr Coralyn Pear 01/2019  . Hypertension   . Renal insufficiency   . Sepsis due to Streptococcus, group B ([place]) 10/05/2018    Past Surgical History:  Procedure Laterality Date  . APPLICATION OF WOUND VAC Left 08/27/2018   Procedure: APPLICATION OF WOUND VAC, left neck;  Surgeon: Gaye Pollack, MD;  Location: [place];  Service: Thoracic;  Laterality: Left;  . APPLICATION OF WOUND VAC Left 08/30/2018   Procedure: APPLICATION OF WOUND VAC;  Surgeon: Gaye Pollack, MD;  Location: [place];  Service: Thoracic;  Laterality: Left;  . COLONOSCOPY  06/24/2011   Procedure: COLONOSCOPY;  Surgeon: [name], MD;  [facility];  Service: Endoscopy;  Laterality: N/A;  8:30 AM  . STERNAL WOUND DEBRIDEMENT Left 08/27/2018   Procedure: Incision and DEBRIDEMENT Left Chest, Neck and  Mediastinum;  Surgeon: Gaye Pollack, MD;  Location: [facility] OR;  Service: Thoracic;  Laterality: Left;  . STERNAL WOUND DEBRIDEMENT Left 08/30/2018   Procedure: WOUND VAC CHANGE, LEFT CHEST AND NECK, POSSIBLE DEBRIDEMENT;  Surgeon: Gaye Pollack, MD;  Location: [place];  Service: Thoracic;  Laterality: Left;    There were no vitals filed for this visit.  Subjective Assessment - 02/10/19 1429    Subjective  Patient reports he is a little sore in his bottom from the exercises. He states he went to the eye doctor yesterday and has surgery on 02/21/19 for his retina as he has "gunk" on the back part of it. He thinks they will start wtih the right eye.    Pertinent History  Mr. Warwick presented to [facility] on 08/17/18 with signs of sepsis. He was transferred to [facility] where he underwent surgery on 08/27/18 for Lt sternoclavicular infection with abscess to Lt neck and mediastinum. The wound was debrided and wound vac changed again on 08/30/18. Pt was discharged on 09/05/18 with home care to manage wound vac and IV line. On 01/19/19 patient had his final follow up with his surgeon, Gaye Pollack, MD, and was discharged as wound was fully healed and no signs of infection were present any longer. He is to follow up with his PCP no, Sallee Lange,  MD.    Currently in Pain?  No/denies       [facility] Adult PT Treatment/Exercise - 02/10/19 0001      Knee/Hip Exercises: Machines for Strengthening   Cybex Knee Extension  1x 10 reps with 1 plate (32#) Single LE each    Total Gym Leg Press  BodyCraft Leg Press: 2x 15 reps, 30lbs      Knee/Hip Exercises: Standing   Heel Raises  Both;Limitations;2 sets;15 reps    Heel Raises Limitations  2x 15 reps, on decline, 3 sec holds    Lateral Step Up  Both;1 set;10 reps;Hand Hold: 1;Step Height: 4"    Forward Step Up  Both;1 set;10 reps;Hand Hold: 1;Step Height: 4"    Forward Step Up Limitations  Reverse Step Up: Both;1 set;10 reps;Hand Hold: 1;Step Height: 4"    Other Standing Knee  Exercises  Ladder Drill: forward gait 1 foot per box, 4x RT with SPC, 3x RT with no AD in // bars (intermittent UE support)        PT Education - 02/10/19 1514    Education Details  educated on exercise throughout and on step pattern for balance/gait challenges. discussed plan to reduce frequency as patient is limited to 20 visits per year and options to participate in therapy as self pay or initiate care [facility].    Person(s) Educated  Patient    Methods  Explanation       PT Short Term Goals - 01/24/19 1616      PT SHORT TERM GOAL #1   Title  Patient will be independent with HEP, updated PRN, to improve functional strength to improve mobility and return to PLOF.    Baseline  initiated HEP at evaluation    Time  2    Period  Weeks    Status  On-going    Target Date  02/04/19      PT SHORT TERM GOAL #2   Title  Patient will improve LE strength by 1 grade with MMT to indicate significant improvement in strength.    Baseline  significant weakness in bil LE's especially to gluteus medius and hamstrings    Time  4    Period  Weeks    Status  On-going    Target Date  02/11/19      PT SHORT TERM GOAL #3   Title  Patient will perform SLS for 10 seconds on bil LE to indicate improved glut med strangth and improved balance for stair mobility and gait.    Time  4    Period  Weeks    Status  On-going    Target Date  02/11/19      PT SHORT TERM GOAL #4   Title  Patient will ambulate at 0.8 m/s or faster with LRAD during 2 MWT to indicate improved endurance and gait.    Time  4    Period  Weeks    Status  On-going    Target Date  02/11/19        PT Long Term Goals - 01/24/19 1616      PT LONG TERM GOAL #1   Title  Patient will ambulate at 0.8 m/s or faster with LRAD during 6 MWT to indicate improved endurance and gait.    Time  8    Period  Weeks    Status  On-going      PT LONG TERM GOAL #2   Title  Patient will ascend/descend 4 6" stairs  with no hand rail at  independent level with no overt LOB.    Time  8    Period  Weeks    Status  On-going      PT LONG TERM GOAL #3   Title  Patient will report no low back or posterior leg pain for 2 week period that is above 3/10 indicating improved activity tolerance.    Time  8    Period  Weeks    Status  On-going       Plan - 02/10/19 1513    Clinical Impression Statement  Continued with hip/LE strengthening and gait training this session. Patient initiated step ups on 4" step with single UE support. He had greater difficulty with Lt LE control for step up/down, and with isolate quad strengthening on leg extension machine. Patient performed ladder drills this session to facilitate equal step length and he required min assist in open environment with SPC to prevent LOB. He also required min assist in closed environment with intermittent UE support to prevent LOB. He will continue to benefit from skilled PT interventions to address impairments and progress strength for improved mobility and QOL.    Personal Factors and Comorbidities  Fitness;Comorbidity 2    Comorbidities  DM, HTN, recent sepsis with prolonged hospitalization and weight loss    Examination-Activity Limitations  Bathing;Stairs;Stand;Lift;Locomotion Level    Examination-Participation Restrictions  Laundry;Yard Work;Driving;Community Activity;Cleaning    Stability/Clinical Decision Making  Stable/Uncomplicated    Rehab Potential  Good    PT Frequency  3x / week    PT Duration  8 weeks    PT Treatment/Interventions  ADLs/Self Care Home Management;Aquatic Therapy;Cryotherapy;Electrical Stimulation;Moist Heat;Gait training;DME Instruction;Stair training;Functional mobility training;Therapeutic activities;Therapeutic exercise;Balance training;Neuromuscular re-education;Patient/family education;Manual techniques;Passive range of motion;Dry needling;Taping    PT Next Visit Plan  Increase weight for leg press. Continue to increase LE strengthening and  balance.  Continue with machines for strengthening. Progress balance and challenge as able. Continue gait training without AD.    PT Home Exercise Plan  Eval: clamshell with band, seated hamstring stretch  6/16:  standing heelraises, toeraises; 02/08/19: standing hip abd/ext infront of sink/counter with RTB around thigh    Consulted and Agree with Plan of Care  Patient       Patient will benefit from skilled therapeutic intervention in order to improve the following deficits and impairments:  Abnormal gait, Decreased activity tolerance, Decreased balance, Decreased mobility, Difficulty walking, Decreased endurance, Decreased coordination, Decreased strength, Pain, Postural dysfunction, Impaired flexibility  Visit Diagnosis: 1. Muscle weakness (generalized)   2. Difficulty in walking, not elsewhere classified        Problem List Patient Active Problem List   Diagnosis Date Noted  . Diabetic retinopathy of both eyes ([place]) 01/31/2019  . Type 2 diabetes mellitus with hyperglycemia, with long-term current use of insulin ([place]) 10/20/2018  . Sepsis due to Streptococcus, group B ([place]) 10/05/2018  . Dietary counseling and surveillance 09/15/2018  . Non compliance with medical treatment 09/15/2018  . Open wound of right great toe   . Septic arthritis of left sternoclavicular joint ([place])   . Mediastinal abscess ([place]) 08/27/2018  . Cellulitis of great toe, right   . Diabetic hyperosmolar non-ketotic state ([place]) 08/17/2018  . Hyperglycemia 08/17/2018  . AKI (acute kidney injury) ([place]) 08/16/2018  . Hyponatremia 08/16/2018  . Hyperlipidemia associated with type 2 diabetes mellitus ([place]) 03/04/2017  . Erectile dysfunction 03/04/2017  . Personal history of noncompliance with medical treatment, presenting hazards to health 11/22/2015  .  HTN (hypertension), benign 01/30/2014  . Loss of weight 01/30/2014  . Type 2 diabetes mellitus not at goal [facility]) 03/30/2013    Kipp Brood, PT, DPT,  [facility] Physical Therapist with [facility]  02/10/2019 3:16 PM    [place] [address] [place], [place], [address] Phone: [phone]   Fax:  [phone]  Name: Jaycub Noorani Oneal MRN: [id number] Date of Birth: 03/09/1966

## 2019-02-14 ENCOUNTER — Ambulatory Visit (HOSPITAL_COMMUNITY): Payer: Managed Care, Other (non HMO)

## 2019-02-14 ENCOUNTER — Telehealth (HOSPITAL_COMMUNITY): Payer: Self-pay | Admitting: Family Medicine

## 2019-02-14 NOTE — Telephone Encounter (Signed)
02/14/19  9:04 spoke with patient's wife to cx - the son's number the mailbox was full and couldn't leave a message

## 2019-02-14 NOTE — Progress Notes (Signed)
[facility] Note  02/15/2019     CHIEF COMPLAINT Patient presents for Retina Evaluation   HISTORY OF PRESENT ILLNESS: Randy Oneal is a 53 y.o. male who presents to the clinic today for:   HPI    Retina Evaluation    I, the attending physician,  performed the HPI with the patient and updated documentation appropriately.          Comments    BS yesterday: 151 Last HgA1c: 13.0 Patient states his vision is about the same.  Patient states after his last visit and after injection, his eyes became very red--states he didn't notice but his Son brought it to his attention.  Patient has not had a lot of pain but has had some mild discomfort in both eyes.  Patient denies floaters or fol OU.       Last edited by Bernarda Caffey, MD on 02/15/2019  8:52 AM. (History)    pt has a pre-op appt today at 80 for his surgery on Thursday, pt states after the injection at last visit, his eye became very red  Referring physician: [name], MD [address],  [place] [address]  HISTORICAL INFORMATION:   Selected notes from the MEDICAL RECORD NUMBER Referred by Dr.Mark Gershon Crane for concern of decreased vision post cataract sx LEE:  Ocular Hx- PMH-   CURRENT MEDICATIONS: No current outpatient medications on file. (Ophthalmic Drugs)   No current facility-administered medications for this visit.  (Ophthalmic Drugs)   Current Outpatient Medications (Other)  Medication Sig  . amLODipine (NORVASC) 5 MG tablet Take one tablet by mouth each day (Patient taking differently: Take 5 mg by mouth daily. )  . atorvastatin (LIPITOR) 10 MG tablet TAKE ONE TABLET BY MOUTH ONCE DAILY. (Patient taking differently: Take 10 mg by mouth daily at 6 PM. )  . Continuous Blood Gluc Sensor (FREESTYLE LIBRE SENSOR SYSTEM) MISC Use one sensor every 10 days.  . furosemide (LASIX) 40 MG tablet Take 1 tablet (40 mg total) by mouth daily as needed. Take for weight gain of 3-5  lbs (Patient taking differently: Take 40 mg by mouth daily as needed for fluid or edema. Take for weight gain of 3-5 lbs)  . glucose blood (BAYER CONTOUR NEXT TEST) test strip USE TO TEST FOUR TIMES DAILY.  Marland Kitchen insulin lispro (HUMALOG KWIKPEN) 100 UNIT/ML KwikPen Inject 0.06 mLs (6 Units total) into the skin daily with breakfast AND 0.07 mLs (7 Units total) 2 (two) times daily before lunch and supper. (Patient taking differently: Inject 7 Units into the skin daily with breakfast AND 6 Units total 2 (two) times daily before lunch and supper.)  . Insulin Pen Needle (BD PEN NEEDLE NANO 2ND GEN) 32G X 4 MM MISC Four times daily  . metoprolol tartrate (LOPRESSOR) 50 MG tablet Take one half tablet by mouth twice daily (Patient taking differently: Take 25 mg by mouth 2 (two) times daily. )  . MICROLET LANCETS MISC USE FOUR TIMES A DAY AS DIRECTED.  Marland Kitchen nystatin (MYCOSTATIN/NYSTOP) powder Apply topically 2 (two) times daily. To groin and scrotum (Patient not taking: Reported on 02/15/2019)  . pantoprazole (PROTONIX) 40 MG tablet Take 1 tablet (40 mg total) by mouth daily.  . potassium chloride SA (K-DUR,KLOR-CON) 20 MEQ tablet Take 1 tablet (20 mEq total) by mouth daily as needed. Only take on days you use Lasix (Patient not taking: Reported on 02/09/2019)  . sildenafil (REVATIO) 20 MG tablet  May take up to 5 before relations as directed (Patient taking differently: Take 20-100 mg by mouth daily as needed (erectile dysfunction). )  . TRESIBA FLEXTOUCH 100 UNIT/ML SOPN FlexTouch Pen Inject 0.19 mLs (19 Units total) into the skin at bedtime. (Patient taking differently: Inject 22 Units into the skin at bedtime. )   No current facility-administered medications for this visit.  (Other)      REVIEW OF SYSTEMS: ROS    Positive for: Endocrine, Eyes   Negative for: Constitutional, Gastrointestinal, Neurological, Skin, Genitourinary, Musculoskeletal, HENT, Cardiovascular, Respiratory, Psychiatric, Allergic/Imm,  Heme/Lymph   Last edited by Doneen Poisson on 02/15/2019  8:15 AM. (History)       ALLERGIES Allergies  Allergen Reactions  . Tramadol Nausea And Vomiting    Nausea and vomiting    PAST MEDICAL HISTORY Past Medical History:  Diagnosis Date  . Diabetes mellitus    Insulin dependent  . Diabetic retinopathy of both eyes ([place]) 01/31/2019   Dr Coralyn Pear 01/2019  . Hypertension   . Renal insufficiency   . Sepsis due to Streptococcus, group B ([place]) 10/05/2018   Past Surgical History:  Procedure Laterality Date  . APPLICATION OF WOUND VAC Left 08/27/2018   Procedure: APPLICATION OF WOUND VAC, left neck;  Surgeon: Gaye Pollack, MD;  Location: [facility];  Service: Thoracic;  Laterality: Left;  . APPLICATION OF WOUND VAC Left 08/30/2018   Procedure: APPLICATION OF WOUND VAC;  Surgeon: Gaye Pollack, MD;  Location: [facility];  Service: Thoracic;  Laterality: Left;  . CATARACT EXTRACTION W/ INTRAOCULAR LENS  IMPLANT, BILATERAL    . COLONOSCOPY  06/24/2011   Procedure: COLONOSCOPY;  Surgeon: [name], MD;  [facility];  Service: Endoscopy;  Laterality: N/A;  8:30 AM  . EYE SURGERY    . STERNAL WOUND DEBRIDEMENT Left 08/27/2018   Procedure: Incision and DEBRIDEMENT Left Chest, Neck and Mediastinum;  Surgeon: Gaye Pollack, MD;  Location: [facility] OR;  Service: Thoracic;  Laterality: Left;  . STERNAL WOUND DEBRIDEMENT Left 08/30/2018   Procedure: WOUND VAC CHANGE, LEFT CHEST AND NECK, POSSIBLE DEBRIDEMENT;  Surgeon: Gaye Pollack, MD;  Location: [facility] OR;  Service: Thoracic;  Laterality: Left;    FAMILY HISTORY Family History  Problem Relation Age of Onset  . Hypertension Mother   . Colon cancer Neg Hx     SOCIAL HISTORY Social History   Tobacco Use  . Smoking status: Never Smoker  . Smokeless tobacco: Never Used  Substance Use Topics  . Alcohol use: No  . Drug use: No         OPHTHALMIC EXAM:  Base Eye Exam    Visual Acuity (Snellen - Linear)      Right Left   Dist  [place] HM 20/100 +1   Dist ph [place] NI NI       Tonometry (Tonopen, 8:18 AM)      Right Left   Pressure 14 14       Pupils      Dark Light Shape React APD   Right 3 2 Irregular Minimal 0   Left 3 2 Round Minimal 0       Extraocular Movement      Right Left    Full Full       Neuro/Psych    Oriented x3: Yes   Mood/Affect: Normal       Dilation    Both eyes: 1.0% Mydriacyl, 2.5% Phenylephrine @ 8:18 AM  Slit Lamp and Fundus Exam    Slit Lamp Exam      Right Left   Lids/Lashes mild Meibomian gland dysfunction mild Meibomian gland dysfunction   Conjunctiva/Sclera White and quiet White and quiet   Cornea mild Arcus, 1+ Descemet's folds, Well healed temporal cataract wounds mild Arcus, 2+ Descemet's folds, Well healed temporal cataract wounds   Anterior Chamber 4+pigment Deep, 3-4+pigment   Iris slightly irregular, dilated Round and dilated   Lens PC IOL in good position PC IOL in good position   Vitreous Vitreous syneresis, +cell/pigment in anterior vitreous, diffuse vitreous Hemorrhage - clearing, vitreous condensations/traction posteriorly Vitreous syneresis, +cell/pigment in anterior vitreous, diffuse vitreous Hemorrhage settling inferiorly, Posterior vitreous detachment, vitreous condensations       Fundus Exam      Right Left   Disc obscured by NVD, fibrosis +fibrosis, regressing NVD?   Macula hazy view; macula detached in wolf-jaw TRD -- extensive fibrosis Blunted foveal reflex, tractional fibrosis along IT arcades, Focal TRD with retinal tear along distal ST arcade   Vessels attenuated; tortuous; +NV w/ extensive fibrosis attenuated; fibrosis along IT arcades   Periphery Hazy view; periphery attached, macular TRD extending into midzone; peripapillary sub retinal heme nasal and inferior; old VH inferior red reflex; grossly attached, old VH settling inferiorly, light PRP laser changes--room for fill in, scattered fibrosis          IMAGING AND PROCEDURES  Imaging  and Procedures for @TODAY @  Basic metabolic panel     Component Value Flag Ref Range Units Status   Sodium 139      135 - 145 mmol/L Final   Potassium 4.1      3.5 - 5.1 mmol/L Final   Chloride 106      98 - 111 mmol/L Final   CO2 25      22 - 32 mmol/L Final   Glucose, Bld 216      70 - 99 mg/dL Final   BUN 35      6 - 20 mg/dL Final   Creatinine, Ser 3.31      0.61 - 1.24 mg/dL Final   Calcium 8.7      8.9 - 10.3 mg/dL Final   GFR calc non Af Amer 20      >60 mL/min Final   GFR calc Af Amer 23      >60 mL/min Final   Anion gap 8      5 - 15  Final   Comment:   Performed at [facility], [address], [place], [place] [address]        CBC     Component Value Flag Ref Range Units Status   WBC 4.0      4.0 - 10.5 K/uL Final   RBC 3.75      4.22 - 5.81 MIL/uL Final   Hemoglobin 9.8      13.0 - 17.0 g/dL Final   HCT 30.5      39.0 - 52.0 % Final   MCV 81.3      80.0 - 100.0 fL Final   MCH 26.1      26.0 - 34.0 pg Final   MCHC 32.1      30.0 - 36.0 g/dL Final   RDW 15.1      11.5 - 15.5 % Final   Platelets 231      150 - 400 K/uL Final   nRBC 0.0      0.0 - 0.2 %  Final   Comment:   Performed at [facility], [place] [address], [place], [place] [address]        Glucose, capillary     Component Value Flag Ref Range Units Status   Glucose-Capillary 206      70 - 99 mg/dL Final   Comment 1 Notify RN        Final   Comment 2 Document in Chart        Final        Hemoglobin A1c     Component Value Flag Ref Range Units Status   Hgb A1c MFr Bld 7.7      4.8 - 5.6 % Final   Comment:   (NOTE) Pre diabetes:          5.7%-6.4% Diabetes:              >6.4% Glycemic control for   <7.0% adults with diabetes    Mean Plasma Glucose 174.29       mg/dL Final   Comment:   Performed at [facility] [address], [place], [place] [address]        OCT, Retina - OU - Both Eyes       Right Eye Quality was poor. Progression has no prior data. Findings include  preretinal fibrosis (Poor image; +TRD of posterior pole).   Left Eye Quality was poor. Central Foveal Thickness: 412. Progression has been stable. Findings include vitreous traction, preretinal fibrosis, intraretinal fluid, macular pucker, abnormal foveal contour, intraretinal hyper-reflective material, epiretinal membrane, subretinal fluid (Mild interval increase from pucker centrally).   Notes *Images captured and stored on drive  Diagnosis / Impression:  OD: Poor image; +TRD of posterior pole OS: Persistent vitreous opacities; Focal TRD along distal ST arcade -- stable from prior   Clinical management:  See below  Abbreviations: NFP - Normal foveal profile. CME - cystoid macular edema. PED - pigment epithelial detachment. IRF - intraretinal fluid. SRF - subretinal fluid. EZ - ellipsoid zone. ERM - epiretinal membrane. ORA - outer retinal atrophy. ORT - outer retinal tubulation. SRHM - subretinal hyper-reflective material                 ASSESSMENT/PLAN:    ICD-10-CM   1. Proliferative diabetic retinopathy of both eyes with macular edema associated with type 2 diabetes mellitus ([place])  J69.6789   2. 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 Vitreous hemorrhage of both eyes ([place])  H43.13   4. Retinal edema  H35.81 OCT, Retina - OU - Both Eyes  5. Essential hypertension  I10   6. Hypertensive retinopathy of both eyes  H35.033   7. Pseudophakia of both eyes  Z96.1     1-4. Proliferative diabetic retinopathy with DME, TRD, and vitreous hemorrhage OU (OD > OS)  - pt with complex medical history with hospitalization in Jan-Feb 2020 for bacteremia and abscess  - history of poor glycemic control for years  - s/p IVA OU #1 (06.02.20), #2 (07.01.20)  - s/p PRP OS (06.10.20)  - BCVA OD HM; OS stable at 20/80  - exam shows diffuse vitreous and preretinal hemorrhage + scattered fibrosis OU   - interval clearing of VH has improved visualization of posterior pole OU --  now can clearly see significant macular TRD OD; and ?regressing NVD OS  - OCT shows preretinal fibrosis w/ traction OU (OD > OS)  - surgery OD scheduled for February 24, 2019 at 11:30AM [place]  8  - discussed severity of disease, surgical plans (short term and long term) with wife and son  - all questions answered  - f/u POV1 on 7.17.20  5,6. Hypertensive retinopathy OU  - discussed importance of tight BP control  - monitor  7. Pseudophakia OU  - s/p CE/IOL OU (Dr. Gershon Crane)  - post op drops per Dr. Gershon Crane  - recommend adding NSAID QID OU -- samples of prolensa given   Ophthalmic Meds Ordered this visit:  No orders of the defined types were placed in this encounter.      Return in about 10 days (around 02/25/2019) for as scheduled on July 17, POV .  There are no Patient Instructions on file for this visit.   Explained the diagnoses, plan, and follow up with the patient and they expressed understanding.  Patient expressed understanding of the importance of proper follow up care.   This document serves as a record of services personally performed by Gardiner Sleeper, MD, PhD. It was created on their behalf by Ernest Mallick, OA, an ophthalmic assistant. The creation of this record is the provider's dictation and/or activities during the visit.    Electronically signed by: Ernest Mallick, OA  07.06.2020 12:38 PM   Gardiner Sleeper, M.D., Ph.D. Diseases & Surgery of the Retina and Vitreous [facility]  I have reviewed the above documentation for accuracy and completeness, and I agree with the above. Gardiner Sleeper, M.D., Ph.D. 02/15/19 12:40 PM    Abbreviations: M myopia (nearsighted); A astigmatism; H hyperopia (farsighted); P presbyopia; Mrx spectacle prescription;  CTL contact lenses; OD right eye; OS left eye; OU both eyes  XT exotropia; ET esotropia; PEK punctate epithelial keratitis; PEE punctate epithelial erosions; DES dry eye syndrome; MGD meibomian gland  dysfunction; ATs artificial tears; PFAT's preservative free artificial tears; [place] nuclear sclerotic cataract; PSC posterior subcapsular cataract; ERM epi-retinal membrane; PVD posterior vitreous detachment; RD retinal detachment; DM diabetes mellitus; DR diabetic retinopathy; NPDR non-proliferative diabetic retinopathy; PDR proliferative diabetic retinopathy; CSME clinically significant macular edema; DME diabetic macular edema; dbh dot blot hemorrhages; CWS cotton wool spot; POAG primary open angle glaucoma; C/D cup-to-disc ratio; HVF humphrey visual field; GVF goldmann visual field; OCT optical coherence tomography; IOP intraocular pressure; BRVO Branch retinal vein occlusion; CRVO central retinal vein occlusion; CRAO central retinal artery occlusion; BRAO branch retinal artery occlusion; RT retinal tear; SB scleral buckle; PPV pars plana vitrectomy; VH Vitreous hemorrhage; PRP panretinal laser photocoagulation; IVK intravitreal kenalog; VMT vitreomacular traction; MH Macular hole;  NVD neovascularization of the disc; NVE neovascularization elsewhere; AREDS age related eye disease study; ARMD age related macular degeneration; POAG primary open angle glaucoma; EBMD epithelial/anterior basement membrane dystrophy; ACIOL anterior chamber intraocular lens; IOL intraocular lens; PCIOL posterior chamber intraocular lens; Phaco/IOL phacoemulsification with intraocular lens placement; [place] photorefractive keratectomy; LASIK laser assisted in situ keratomileusis; HTN hypertension; DM diabetes mellitus; COPD chronic obstructive pulmonary disease

## 2019-02-14 NOTE — Pre-Procedure Instructions (Signed)
[place], [place] [address] Phone: [phone] Fax: [phone]  [id number] - [place], [address] AT [address] [place] [address] Phone: [phone] Fax: [phone]      Your procedure is scheduled on Thursday 02-24-19  Report to [facility] Main Entrance "A" at 0930 A.M., and check in at the Admitting office.  Call this number if you have problems the morning of surgery:  [phone]  Call [phone] if you have any questions prior to your surgery date Monday-Friday 8am-4pm    Remember:  Do not eat or drink after midnight the night before your surgery  Take these medicines the morning of surgery with A SIP OF WATER : amLODipine (NORVASC) atorvastatin (LIPITOR) ketorolac (ACULAR) metoprolol tartrate (LOPRESSOR) lisinopril (PRINIVIL,ZESTRIL) as scheduled ofloxacin (OCUFLOX) as scheduled pantoprazole (PROTONIX) prednisoLONE acetate (PRED FORTE)  7 days prior to surgery STOP taking any Aspirin (unless otherwise instructed by your surgeon), Aleve, Naproxen, Ibuprofen, Motrin, Advil, Goody's, BC's, all herbal medications, fish oil, and all vitamins.   WHAT DO I DO ABOUT MY DIABETES MEDICATION?   Marland Kitchen Do not take TRESIBA at bedtime the night before surgery.       The day before surgery-Take Insulin Lispro(Humalog) 6 U before breakfast and 7 units before lunch. Do not take bedtime dose. The day of surgery-Do not take Insulin Lispro in the morning.  How to Manage Your Diabetes Before and After Surgery  Why is it important to control my blood sugar before and after surgery? . Improving blood sugar levels before and after surgery helps healing and can limit problems. . A way of improving blood sugar control is eating a healthy diet by: o  Eating less sugar and carbohydrates o  Increasing activity/exercise o  Talking with your doctor about reaching your blood  sugar goals . High blood sugars (greater than 180 mg/dL) can raise your risk of infections and slow your recovery, so you will need to focus on controlling your diabetes during the weeks before surgery. . Make sure that the doctor who takes care of your diabetes knows about your planned surgery including the date and location.  How do I manage my blood sugar before surgery? . Check your blood sugar at least 4 times a day, starting 2 days before surgery, to make sure that the level is not too high or low. o Check your blood sugar the morning of your surgery when you wake up and every 2 hours until you get to the Short Stay unit. . If your blood sugar is less than 70 mg/dL, you will need to treat for low blood sugar: o Do not take insulin. o Treat a low blood sugar (less than 70 mg/dL) with  cup of clear juice (cranberry or apple), 4 glucose tablets, OR glucose gel. o Recheck blood sugar in 15 minutes after treatment (to make sure it is greater than 70 mg/dL). If your blood sugar is not greater than 70 mg/dL on recheck, call [phone] for further instructions. . Report your blood sugar to the short stay nurse when you get to Short Stay.  . If you are admitted to the hospital after surgery: o Your blood sugar will be checked by the staff and you will probably be given insulin after surgery (instead of oral diabetes medicines) to make sure you have good blood sugar levels. o The goal for blood sugar control after surgery is  80-180 mg/dL.    The Morning of Surgery  Do not wear jewelry.  Do not wear lotions, colognes, or deodorant    Men may shave face and neck.  Do not bring valuables to the hospital.  [facility] is not responsible for any belongings or valuables.  If you are a smoker, DO NOT Smoke 24 hours prior to surgery IF you wear a CPAP at night please bring your mask, tubing, and machine the morning of surgery   Remember that you must have someone to transport you home after your  surgery, and remain with you for 24 hours if you are discharged the same day.   Contacts, glasses, hearing aids, dentures or bridgework may not be worn into surgery.    Leave your suitcase in the car.  After surgery it may be brought to your room.  For patients admitted to the hospital, discharge time will be determined by your treatment team.  Patients discharged the day of surgery will not be allowed to drive home.    Special instructions:   [facility]- Preparing For Surgery  Before surgery, you can play an important role. Because skin is not sterile, your skin needs to be as free of germs as possible. You can reduce the number of germs on your skin by washing with CHG (chlorahexidine gluconate) Soap before surgery.  CHG is an antiseptic cleaner which kills germs and bonds with the skin to continue killing germs even after washing.    Oral Hygiene is also important to reduce your risk of infection.  Remember - BRUSH YOUR TEETH THE MORNING OF SURGERY WITH YOUR REGULAR TOOTHPASTE  Please do not use if you have an allergy to CHG or antibacterial soaps. If your skin becomes reddened/irritated stop using the CHG.  Do not shave (including legs and underarms) for at least 48 hours prior to first CHG shower. It is OK to shave your face.  Please follow these instructions carefully.   1. Shower the NIGHT BEFORE SURGERY and the MORNING OF SURGERY with CHG Soap.   2. If you chose to wash your hair, wash your hair first as usual with your normal shampoo.  3. After you shampoo, rinse your hair and body thoroughly to remove the shampoo.  4. Use CHG as you would any other liquid soap. You can apply CHG directly to the skin and wash gently with a scrungie or a clean washcloth.   5. Apply the CHG Soap to your body ONLY FROM THE NECK DOWN.  Do not use on open wounds or open sores. Avoid contact with your eyes, ears, mouth and genitals (private parts). Wash Face and genitals (private parts)  with  your normal soap.   6. Wash thoroughly, paying special attention to the area where your surgery will be performed.  7. Thoroughly rinse your body with warm water from the neck down.  8. DO NOT shower/wash with your normal soap after using and rinsing off the CHG Soap.  9. Pat yourself dry with a CLEAN TOWEL.  10. Wear CLEAN PAJAMAS to bed the night before surgery, wear comfortable clothes the morning of surgery  11. Place CLEAN SHEETS on your bed the night of your first shower and DO NOT SLEEP WITH PETS.    Day of Surgery:  Do not apply any deodorants/lotions. Please shower the morning of surgery with the CHG soap  Please wear clean clothes to the hospital/surgery center.   Remember to brush your teeth WITH YOUR REGULAR TOOTHPASTE.  Please read over the following sheets that you were given.

## 2019-02-14 NOTE — H&P (Signed)
Randy Oneal is an 53 y.o. male.    Chief Complaint: Decreased vision due to diabetic tractional retinal detachment OD  HPI: Pt with history of poorly controlled diabetes presents with progressive vision loss OU (OD > OS). 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 Discussed findings, prognosis and treatment options. Pt elects to proceed with surgery to clear vitreous hemorrhage and repair tractional retinal detachment of right eye, under general anesthesia.  Past Medical History:  Diagnosis Date  . Diabetes mellitus    Insulin dependent  . Diabetic retinopathy of both eyes ([place]) 01/31/2019   Dr Coralyn Pear 01/2019  . Hypertension   . Renal insufficiency   . Sepsis due to Streptococcus, group B ([place]) 10/05/2018    Past Surgical History:  Procedure Laterality Date  . APPLICATION OF WOUND VAC Left 08/27/2018   Procedure: APPLICATION OF WOUND VAC, left neck;  Surgeon: Gaye Pollack, MD;  Location: [place];  Service: Thoracic;  Laterality: Left;  . APPLICATION OF WOUND VAC Left 08/30/2018   Procedure: APPLICATION OF WOUND VAC;  Surgeon: Gaye Pollack, MD;  Location: [place];  Service: Thoracic;  Laterality: Left;  . COLONOSCOPY  06/24/2011   Procedure: COLONOSCOPY;  Surgeon: [name], MD;  [facility];  Service: Endoscopy;  Laterality: N/A;  8:30 AM  . STERNAL WOUND DEBRIDEMENT Left 08/27/2018   Procedure: Incision and DEBRIDEMENT Left Chest, Neck and Mediastinum;  Surgeon: Gaye Pollack, MD;  Location: [facility] OR;  Service: Thoracic;  Laterality: Left;  . STERNAL WOUND DEBRIDEMENT Left 08/30/2018   Procedure: WOUND VAC CHANGE, LEFT CHEST AND NECK, POSSIBLE DEBRIDEMENT;  Surgeon: Gaye Pollack, MD;  [facility];  Service: Thoracic;  Laterality: Left;    Family History  Problem Relation Age of Onset  . Hypertension Mother   . Colon cancer Neg Hx    Social History:  reports that he has never smoked. He has never used  smokeless tobacco. He reports that he does not drink alcohol or use drugs.  Allergies:  Allergies  Allergen Reactions  . Tramadol Nausea And Vomiting    Nausea and vomiting    No medications prior to admission.    Review of systems otherwise negative  There were no vitals taken for this visit.  Physical exam: Mental status: oriented x3. Eyes: See eye exam associated with this date of surgery Ears, Nose, Throat: within normal limits Neck: Within Normal limits General: within normal limits Chest: Within normal limits Breast: deferred Heart: Within normal limits Abdomen: Within normal limits GU: deferred Extremities: within normal limits Skin: within normal limits  Assessment/Plan 1. 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 Vitreoretinal Surgeon [facility]

## 2019-02-15 ENCOUNTER — Encounter (HOSPITAL_COMMUNITY)
Admission: RE | Admit: 2019-02-15 | Discharge: 2019-02-15 | Disposition: A | Discharge status: Home / Self Care | Payer: Managed Care, Other (non HMO) | Source: Ambulatory Visit | Attending: Ophthalmology | Admitting: Ophthalmology

## 2019-02-15 ENCOUNTER — Telehealth: Payer: Self-pay | Admitting: Family Medicine

## 2019-02-15 ENCOUNTER — Encounter (HOSPITAL_COMMUNITY): Payer: Self-pay

## 2019-02-15 ENCOUNTER — Ambulatory Visit (INDEPENDENT_AMBULATORY_CARE_PROVIDER_SITE_OTHER): Payer: Managed Care, Other (non HMO) | Admitting: Ophthalmology

## 2019-02-15 ENCOUNTER — Ambulatory Visit (INDEPENDENT_AMBULATORY_CARE_PROVIDER_SITE_OTHER): Payer: Managed Care, Other (non HMO) | Admitting: Family Medicine

## 2019-02-15 ENCOUNTER — Other Ambulatory Visit: Payer: Self-pay

## 2019-02-15 ENCOUNTER — Encounter (HOSPITAL_COMMUNITY): Payer: Managed Care, Other (non HMO)

## 2019-02-15 ENCOUNTER — Encounter (INDEPENDENT_AMBULATORY_CARE_PROVIDER_SITE_OTHER): Payer: Self-pay | Admitting: Ophthalmology

## 2019-02-15 DIAGNOSIS — I129 Hypertensive chronic kidney disease with stage 1 through stage 4 chronic kidney disease, or unspecified chronic kidney disease: Secondary | ICD-10-CM | POA: Insufficient documentation

## 2019-02-15 DIAGNOSIS — E1122 Type 2 diabetes mellitus with diabetic chronic kidney disease: Secondary | ICD-10-CM | POA: Diagnosis not present

## 2019-02-15 DIAGNOSIS — Z79899 Other long term (current) drug therapy: Secondary | ICD-10-CM | POA: Diagnosis not present

## 2019-02-15 DIAGNOSIS — H4313 Vitreous hemorrhage, bilateral: Secondary | ICD-10-CM

## 2019-02-15 DIAGNOSIS — N179 Acute kidney failure, unspecified: Secondary | ICD-10-CM

## 2019-02-15 DIAGNOSIS — Z794 Long term (current) use of insulin: Secondary | ICD-10-CM | POA: Insufficient documentation

## 2019-02-15 DIAGNOSIS — Z01818 Encounter for other preprocedural examination: Secondary | ICD-10-CM | POA: Diagnosis not present

## 2019-02-15 DIAGNOSIS — I10 Essential (primary) hypertension: Secondary | ICD-10-CM

## 2019-02-15 DIAGNOSIS — H3341 Traction detachment of retina, right eye: Secondary | ICD-10-CM | POA: Insufficient documentation

## 2019-02-15 DIAGNOSIS — E11319 Type 2 diabetes mellitus with unspecified diabetic retinopathy without macular edema: Secondary | ICD-10-CM | POA: Diagnosis not present

## 2019-02-15 DIAGNOSIS — Z961 Presence of intraocular lens: Secondary | ICD-10-CM

## 2019-02-15 DIAGNOSIS — H3581 Retinal edema: Secondary | ICD-10-CM | POA: Diagnosis not present

## 2019-02-15 DIAGNOSIS — E113513 Type 2 diabetes mellitus with proliferative diabetic retinopathy with macular edema, bilateral: Secondary | ICD-10-CM

## 2019-02-15 DIAGNOSIS — H35033 Hypertensive retinopathy, bilateral: Secondary | ICD-10-CM

## 2019-02-15 DIAGNOSIS — H3343 Traction detachment of retina, bilateral: Secondary | ICD-10-CM

## 2019-02-15 LAB — CBC
HCT: 30.5 % — ABNORMAL LOW (ref 39.0–52.0)
Hemoglobin: 9.8 g/dL — ABNORMAL LOW (ref 13.0–17.0)
MCH: 26.1 pg (ref 26.0–34.0)
MCHC: 32.1 g/dL (ref 30.0–36.0)
MCV: 81.3 fL (ref 80.0–100.0)
Platelets: 231 10*3/uL (ref 150–400)
RBC: 3.75 MIL/uL — ABNORMAL LOW (ref 4.22–5.81)
RDW: 15.1 % (ref 11.5–15.5)
WBC: 4 10*3/uL (ref 4.0–10.5)
nRBC: 0 % (ref 0.0–0.2)

## 2019-02-15 LAB — BASIC METABOLIC PANEL
Anion gap: 8 (ref 5–15)
BUN: 35 mg/dL — ABNORMAL HIGH (ref 6–20)
CO2: 25 mmol/L (ref 22–32)
Calcium: 8.7 mg/dL — ABNORMAL LOW (ref 8.9–10.3)
Chloride: 106 mmol/L (ref 98–111)
Creatinine, Ser: 3.31 mg/dL — ABNORMAL HIGH (ref 0.61–1.24)
GFR calc Af Amer: 23 mL/min — ABNORMAL LOW (ref >60)
GFR calc non Af Amer: 20 mL/min — ABNORMAL LOW (ref >60)
Glucose, Bld: 216 mg/dL — ABNORMAL HIGH (ref 70–99)
Potassium: 4.1 mmol/L (ref 3.5–5.1)
Sodium: 139 mmol/L (ref 135–145)

## 2019-02-15 LAB — HEMOGLOBIN A1C
Hgb A1c MFr Bld: 7.7 % — ABNORMAL HIGH (ref 4.8–5.6)
Mean Plasma Glucose: 174.29 mg/dL

## 2019-02-15 LAB — GLUCOSE, CAPILLARY: Glucose-Capillary: 206 mg/dL — ABNORMAL HIGH (ref 70–99)

## 2019-02-15 NOTE — Telephone Encounter (Signed)
Nurses please will reach out to the patient let him know his kidney function is significantly worse I would recommend a phone visit with the patient today Please add to the end of the schedule let the patient know that I will be calling him on his phone between [phone]

## 2019-02-15 NOTE — Progress Notes (Signed)
Patient BP at PAT appointment 185/99 (right arm), then rechecked BP 166/93 (left arm). Patient instructed to take scheduled BP med once home.

## 2019-02-15 NOTE — Pre-Procedure Instructions (Addendum)
[place], [address] [address] Phone: [phone] Fax: [phone]  [id number] - [place], [address] [place] [address] Phone: [phone] Fax: [phone]    Your procedure is scheduled on Thursday 02-24-19  Report to [facility] Main Entrance "A" at 0930 A.M., and check in at the Admitting office.  Call this number if you have problems the morning of surgery:  [phone]  Call [phone] if you have any questions prior to your surgery date Monday-Friday 8am-4pm   Remember:  Do not eat or drink after midnight the night before your surgery  Take these medicines the morning of surgery with A SIP OF WATER : amLODipine (NORVASC) atorvastatin (LIPITOR) ketorolac (ACULAR) - eye drops metoprolol tartrate (LOPRESSOR) pantoprazole (PROTONIX) prednisoLONE acetate (PRED FORTE) - eye drops 7 days prior to surgery STOP taking any Aspirin (unless otherwise instructed by your surgeon), Aleve, Naproxen, Ibuprofen, Motrin, Advil, Goody's, BC's, all herbal medications, fish oil, and all vitamins.  How to Manage Your Diabetes Before and After Surgery  Why is it important to control my blood sugar before and after surgery? . Improving blood sugar levels before and after surgery helps healing and can limit problems. . A way of improving blood sugar control is eating a healthy diet by: o  Eating less sugar and carbohydrates o  Increasing activity/exercise o  Talking with your doctor about reaching your blood sugar goals . High blood sugars (greater than 180 mg/dL) can raise your risk of infections and slow your recovery, so you will need to focus on controlling your diabetes during the weeks before surgery. . Make sure that the doctor who takes care of your diabetes knows about your planned surgery including the date and location.  How do I manage my blood sugar before  surgery? . Check your blood sugar at least 4 times a day, starting 2 days before surgery, to make sure that the level is not too high or low. o Check your blood sugar the morning of your surgery when you wake up and every 2 hours until you get to the Short Stay unit. . If your blood sugar is less than 70 mg/dL, you will need to treat for low blood sugar: o Do not take insulin. o Treat a low blood sugar (less than 70 mg/dL) with  cup of clear juice (cranberry or apple), 4 glucose tablets, OR glucose gel. Recheck blood sugar in 15 minutes after treatment (to make sure it is greater than 70 mg/dL). If your blood sugar is not greater than 70 mg/dL on recheck, call [phone] o  for further instructions. . Report your blood sugar to the short stay nurse when you get to Short Stay.  . If you are admitted to the hospital after surgery: o Your blood sugar will be checked by the staff and you will probably be given insulin after surgery (instead of oral diabetes medicines) to make sure you have good blood sugar levels. o The goal for blood sugar control after surgery is 80-180 mg/dL.   WHAT DO I DO ABOUT MY DIABETES MEDICATION?  Marland Kitchen Do not take oral diabetes medicines (pills) the morning of surgery.  . THE NIGHT BEFORE SURGERY, do NOT take insulin lispro            The night of before surgery, take 11 units of TRESIBA FLEXTOUCH   Reviewed and Endorsed by [facility]  Patient Education Committee, August 2015 Special instructions:   [facility]- Preparing For Surgery  Before surgery, you can play an important role. Because skin is not sterile, your skin needs to be as free of germs as possible. You can reduce the number of germs on your skin by washing with CHG (chlorahexidine gluconate) Soap before surgery.  CHG is an antiseptic cleaner which kills germs and bonds with the skin to continue killing germs even after washing.    Oral Hygiene is also important to reduce your risk of infection.   Remember - BRUSH YOUR TEETH THE MORNING OF SURGERY WITH YOUR REGULAR TOOTHPASTE  Please do not use if you have an allergy to CHG or antibacterial soaps. If your skin becomes reddened/irritated stop using the CHG.  Do not shave (including legs and underarms) for at least 48 hours prior to first CHG shower. It is OK to shave your face.  Please follow these instructions carefully.   1. Shower the NIGHT BEFORE SURGERY and the MORNING OF SURGERY with CHG.   2. If you chose to wash your hair, wash your hair first as usual with your normal shampoo.  3. After you shampoo, rinse your hair and body thoroughly to remove the shampoo.  4. Use CHG as you would any other liquid soap. You can apply CHG directly to the skin and wash gently with a scrungie or a clean washcloth.   5. Apply the CHG Soap to your body ONLY FROM THE NECK DOWN.  Do not use on open wounds or open sores. Avoid contact with your eyes, ears, mouth and genitals (private parts). Wash Face and genitals (private parts)  with your normal soap.  6. Wash thoroughly, paying special attention to the area where your surgery will be performed.  7. Thoroughly rinse your body with warm water from the neck down.  8. DO NOT shower/wash with your normal soap after using and rinsing off the CHG Soap.  9. Pat yourself dry with a CLEAN TOWEL.  10. Wear CLEAN PAJAMAS to bed the night before surgery, wear comfortable clothes the morning of surgery  11. Place CLEAN SHEETS on your bed the night of your first shower and DO NOT SLEEP WITH PETS.  Day of Surgery: Do not wear jewelry, make-up or nail polish.  Do not wear lotions, powders, or perfumes/cologne, or deodorant.  Do not shave 48 hours prior to surgery.  Men may shave neck and face.  Do not bring valuables to the hospital.  [facility] is not responsible for any belongings or valuables.  Please wear clean clothes to the hospital/surgery center.   Remember to brush your teeth WITH YOUR  REGULAR TOOTHPASTE.  Contacts, dentures or bridgework may not be worn into surgery.  Leave your suitcase in the car.  After surgery it may be brought to your room.  For patients admitted to the hospital, discharge time will be determined by your treatment team.  Patients discharged the day of surgery will not be allowed to drive home.

## 2019-02-15 NOTE — Progress Notes (Addendum)
Anesthesia Chart Review:  Case: [id number] Date/Time: 02/24/19 1115   Procedure: 25 GAUGE PARS PLANA VITRECTOMY WITH 20 GAUGE MVR PORT FOR MACULAR HOLE (Right )   Anesthesia type: General   Pre-op diagnosis: tractional retinal detachment right eye   [facility]: [facility] OR ROOM 08 / [place] OR   Surgeon: Bernarda Caffey, MD      DISCUSSION: Patient is a 53 year old scheduled for the above procedure.  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 - Admission 08/16/18-09/05/18 with diabetic hyperosmolar non-ketotic state, hyponatremia, AKI. He was also noted to have right great toe cellulitis and plantar pustule on his left foot. No drainable abscess per general surgery and was treated with IV antibiotics for sepsis. Blood cultures grew group B Strep. He later developed swelling and tenderness over his left neck and sternoclavicular region with CT showing abscess. He was transferred from [facility] to [facility] and seen by ENT Leta Baptist, MD, CT surgery Gilford Raid, MD, and ID Scharlene Gloss, MD for septic sternoclavicular arthritis. S/p I&D of left sternoclavicular joint, left neck, and mediastinum 08/27/18. Discharged home 09/05/18 with IV antibiotic via RUE PICC line and HHRN for sternal wound VAC changes.   Contacted Dr. Coralyn Pear and Dr. Wolfgang Phoenix on 02/15/19 after PAT RN called to report patient's Creatinine had increased from 1.63 to 3.31 in the past three months. He was also anemic with H/H 9.8/30.5, but this was up from 7.0/21.3 in 09/02/18. A1c 7.7, down from 13.0 on 08/16/18. Acute change in renal function will need to be further addressed prior to surgery--does not appear to be an elective procedure, but not scheduled for another 9 days.   Dr. Wolfgang Phoenix arranged a televisit with patient on the afternoon of 02/15/19 to further address abnormal labs. Will leave chart for follow-up.   ADDENDUM 02/18/19 2:11 PM:  Lab Results  Component Value Date    CREATININE 2.78 (H) 02/17/2019   CREATININE 3.31 (H) 02/15/2019   CREATININE 1.63 (H) 10/20/2018   Patient has now had two encounters with Dr. Wolfgang Phoenix regarding renal dysfunction. It was unclear whether acute change was related to diabetes or renal injury from recent antibiotics for sternal wound. On 02/16/19, he ordered repeat labs and referred patient to nephrology, but wrote, "I believe the patient can go ahead with the surgery but will need intensive follow-up regarding his kidneys." 02/17/19 repeat labs showed BUN 31, Cr 2.78, eGFR 29. Urine microalbumin w/creatinine ration is in process. Creatinine slightly improved. I've asked Dr. Wolfgang Phoenix to let me know if any additional recommendations based on labs results, otherwise if none then we will have him get a STAT BMET on the day of surgery to evaluate for stability of his renal function. Anesthesiologist Nolon Nations, MD is in agreement with this plan. Patient's assigned anesthesiologist will review repeat labs on the day of surgery and anesthesia plan determined at that time. I will sent a staff message to Dr. Coralyn Pear with update.  Presurgical COVID test scheduled for 02/21/2019. (UPDATE 02/21/19 9:22 AM: Per Dr. Wolfgang Phoenix, he agrees with preoperative BMET and if results stable then feels patient can proceed as planned. He has referred patient to nephrology, but he cannot be seen prior to surgery date.)        VS: BP (!) 166/93 Comment: taken in left arm/lg cuff/notified RoNiqua RN  Pulse 82   Temp 37 C   Resp 20   Ht 6' (1.829 m)   Wt 79.7  kg   SpO2 100%   BMI 23.82 kg/m    PROVIDERS: [name], MD is PCP Collier Flowers, MD is endocrinologist. Last visit 11/17/18. Three month follow-up. Gilford Raid, MD is CT surgeon. Last visit 01/19/19. As needed follow-up recommended as drainage sinus had completely closed from his sternal wound without active signs of infection.   LABS: PAT labs reviewed--See DISCUSSION. (all labs ordered are  listed, but only abnormal results are displayed)  Labs Reviewed  BASIC METABOLIC PANEL - Abnormal; Notable for the following components:      Result Value   Glucose, Bld 216 (*)    BUN 35 (*)    Creatinine, Ser 3.31 (*)    Calcium 8.7 (*)    GFR calc non Af Amer 20 (*)    GFR calc Af Amer 23 (*)    All other components within normal limits  CBC - Abnormal; Notable for the following components:   RBC 3.75 (*)    Hemoglobin 9.8 (*)    HCT 30.5 (*)    All other components within normal limits  GLUCOSE, CAPILLARY - Abnormal; Notable for the following components:   Glucose-Capillary 206 (*)    All other components within normal limits  HEMOGLOBIN A1C - Abnormal; Notable for the following components:   Hgb A1c MFr Bld 7.7 (*)    All other components within normal limits     EKG: 02/15/19: NSR   CV: Echo 08/18/18: Study Conclusions - Left ventricle: The cavity size was normal. There was mild   concentric hypertrophy. Systolic function was vigorous. The   estimated ejection fraction was in the range of 65% to 70%. Wall   motion was normal; there were no regional wall motion   abnormalities. Left ventricular diastolic function parameters   were normal.   Past Medical History:  Diagnosis Date  . Diabetes mellitus    Insulin dependent  . Diabetic retinopathy of both eyes ([place]) 01/31/2019   Dr Coralyn Pear 01/2019  . Hypertension   . Renal insufficiency   . Sepsis due to Streptococcus, group B ([place]) 10/05/2018    Past Surgical History:  Procedure Laterality Date  . APPLICATION OF WOUND VAC Left 08/27/2018   Procedure: APPLICATION OF WOUND VAC, left neck;  Surgeon: Gaye Pollack, MD;  Location: [place];  Service: Thoracic;  Laterality: Left;  . APPLICATION OF WOUND VAC Left 08/30/2018   Procedure: APPLICATION OF WOUND VAC;  Surgeon: Gaye Pollack, MD;  Location: [place];  Service: Thoracic;  Laterality: Left;  . CATARACT EXTRACTION W/ INTRAOCULAR LENS  IMPLANT, BILATERAL    .  COLONOSCOPY  06/24/2011   Procedure: COLONOSCOPY;  Surgeon: [name], MD;  [facility];  Service: Endoscopy;  Laterality: N/A;  8:30 AM  . EYE SURGERY    . STERNAL WOUND DEBRIDEMENT Left 08/27/2018   Procedure: Incision and DEBRIDEMENT Left Chest, Neck and Mediastinum;  Surgeon: Gaye Pollack, MD;  Location: [facility] OR;  Service: Thoracic;  Laterality: Left;  . STERNAL WOUND DEBRIDEMENT Left 08/30/2018   Procedure: WOUND VAC CHANGE, LEFT CHEST AND NECK, POSSIBLE DEBRIDEMENT;  Surgeon: Gaye Pollack, MD;  [facility];  Service: Thoracic;  Laterality: Left;    MEDICATIONS: . amLODipine (NORVASC) 5 MG tablet  . atorvastatin (LIPITOR) 10 MG tablet  . Continuous Blood Gluc Sensor ([place]) MISC  . furosemide (LASIX) 40 MG tablet  . glucose blood (BAYER CONTOUR NEXT TEST) test strip  . insulin lispro (HUMALOG  KWIKPEN) 100 UNIT/ML KwikPen  . Insulin Pen Needle (BD PEN NEEDLE NANO 2ND GEN) 32G X 4 MM MISC  . metoprolol tartrate (LOPRESSOR) 50 MG tablet  . MICROLET LANCETS MISC  . nystatin (MYCOSTATIN/NYSTOP) powder  . pantoprazole (PROTONIX) 40 MG tablet  . potassium chloride SA (K-DUR,KLOR-CON) 20 MEQ tablet  . sildenafil (REVATIO) 20 MG tablet  . TRESIBA FLEXTOUCH 100 UNIT/ML SOPN FlexTouch Pen   No current facility-administered medications for this encounter.     Myra Gianotti, PA-C Surgical Short Stay/Anesthesiology [facility] Phone [phone] [facility] Phone [phone] 02/15/2019 5:22 PM

## 2019-02-15 NOTE — Progress Notes (Signed)
   Subjective:    Patient ID: Randy Oneal, male    DOB: November 19, 1965, 53 y.o.   MRN: [id number] Telephone only video failed HPI  Patient calls to discuss results of recent blood work. Patient is scheduled for upcoming eye surgery and had preop labs including kidney function drawn today and results were called to doctor. I talked with the patient regarding his results.  He is unaware of the results.  He states recently he has been doing well taking his medication eating and drinking normally urinating normally he denies any problems no recent fevers chills recently finished the antibiotics from his hospital stay patient is having some retinal issues and supposed to have surgery coming up Virtual Visit via Video Note  I connected with Randy Oneal on 02/15/19 at  4:20 PM EDT by a video enabled telemedicine application and verified that I am speaking with the correct person using two identifiers.  Location: Patient: home Provider: office   I discussed the limitations of evaluation and management by telemedicine and the availability of in person appointments. The patient expressed understanding and agreed to proceed.  History of Present Illness:    Observations/Objective:   Assessment and Plan:   Follow Up Instructions:    I discussed the assessment and treatment plan with the patient. The patient was provided an opportunity to ask questions and all were answered. The patient agreed with the plan and demonstrated an understanding of the instructions.   The patient was advised to call back or seek an in-person evaluation if the symptoms worsen or if the condition fails to improve as anticipated.  I provided 15 minutes of non-face-to-face time during this encounter.     Review of Systems  Constitutional: Negative for activity change.  HENT: Negative for congestion and rhinorrhea.   Respiratory: Negative for cough and shortness of breath.   Cardiovascular: Negative for chest  pain.  Gastrointestinal: Negative for abdominal pain, diarrhea, nausea and vomiting.  Genitourinary: Negative for dysuria and hematuria.  Neurological: Negative for weakness and headaches.  Psychiatric/Behavioral: Negative for behavioral problems and confusion.       Objective:   Physical Exam  Today's visit was via telephone Physical exam was not possible for this visit       Assessment & Plan:  Significant renal dysfunction Elevated creatinine Acute renal failure Patient will be coming in tomorrow bringing all of his medications we will check a urine at that time more than likely repeat lab work and then potentially be setting him up for ultrasound as well as nephrology consult patient voices understanding will follow-up accordingly  It is possible this could be a rapid progression of kidney dysfunction related to recent illnesses as well as his underlying diabetes and vascular disease

## 2019-02-15 NOTE — Progress Notes (Addendum)
PCP - Dr. Sallee Lange Cardiologist - denies Endocrinologist - Dr. Melanie Crazier [facility]  Chest x-ray - deniesEKG -  Stress Test - denies ECHO - denies Cardiac Cath - denies EKG - 02/15/2019 Sleep Study - denies CPAP - N/A  Fasting Blood Sugar - 80 - 201 Checks Blood Sugar 4 times a day  Blood Thinner Instructions: N/A Aspirin Instructions: N/A  Anesthesia review: YES, abnormal labs  Coronavirus Screening  Have you experienced the following symptoms:  Cough yes/no: No Fever (>100.19F)  yes/no: No Runny nose yes/no: No Sore throat yes/no: No Difficulty breathing/shortness of breath  yes/no: No  Have you or a family member traveled in the last 14 days and where? yes/no: No  If the patient indicates "YES" to the above questions, their PAT will be rescheduled to limit the exposure to others and, the surgeon will be notified. THE PATIENT WILL NEED TO BE ASYMPTOMATIC FOR 14 DAYS.   If the patient is not experiencing any of these symptoms, the PAT nurse will instruct them to NOT bring anyone with them to their appointment since they may have these symptoms or traveled as well.   Please remind your patients and families that hospital visitation restrictions are in effect and the importance of the restrictions.   Patient denies shortness of breath, fever, cough and chest pain at PAT appointment  Patient verbalized understanding of instructions that were given to them at the PAT appointment. Patient was also instructed that they will need to review over the PAT instructions again at home before surgery.

## 2019-02-15 NOTE — Progress Notes (Signed)
Dr.Zamora notified about patient labs.

## 2019-02-15 NOTE — Telephone Encounter (Signed)
Patient scheduled virtual visit today with Dr Nicki Reaper

## 2019-02-15 NOTE — Telephone Encounter (Signed)
Randy Oneal, [name] [phone]) with anesthesia with [facility] called to report that the patient is scheduled for eye surgery with Dr. Coralyn Pear 02/24/2019 & pt's pr-op labs were elevated  Please review labs & give suggestion on next step - ER for eval or OV here or recheck labs  Pt's creatinine is 3.31 today & was 1.63 just 3 months ago  Please advise

## 2019-02-16 ENCOUNTER — Ambulatory Visit: Payer: Managed Care, Other (non HMO) | Admitting: Internal Medicine

## 2019-02-16 ENCOUNTER — Encounter: Payer: Self-pay | Admitting: Family Medicine

## 2019-02-16 ENCOUNTER — Ambulatory Visit (INDEPENDENT_AMBULATORY_CARE_PROVIDER_SITE_OTHER): Payer: Managed Care, Other (non HMO) | Admitting: Family Medicine

## 2019-02-16 VITALS — BP 152/90 | Temp 98.4°F | Wt 181.6 lb

## 2019-02-16 DIAGNOSIS — I10 Essential (primary) hypertension: Secondary | ICD-10-CM

## 2019-02-16 DIAGNOSIS — Z0289 Encounter for other administrative examinations: Secondary | ICD-10-CM

## 2019-02-16 DIAGNOSIS — R300 Dysuria: Secondary | ICD-10-CM

## 2019-02-16 DIAGNOSIS — E119 Type 2 diabetes mellitus without complications: Secondary | ICD-10-CM

## 2019-02-16 DIAGNOSIS — N179 Acute kidney failure, unspecified: Secondary | ICD-10-CM

## 2019-02-16 LAB — POCT URINALYSIS DIPSTICK
Glucose, UA: POSITIVE — AB
Protein, UA: POSITIVE — AB
Spec Grav, UA: 1.015 (ref 1.010–1.025)
pH, UA: 6 (ref 5.0–8.0)

## 2019-02-16 MED ORDER — FAMOTIDINE 40 MG PO TABS: ORAL_TABLET | ORAL | 5 refills | Status: DC

## 2019-02-16 MED ORDER — AMLODIPINE BESYLATE 10 MG PO TABS: ORAL_TABLET | ORAL | 5 refills | Status: DC

## 2019-02-16 NOTE — Progress Notes (Deleted)
PATIENT IDENTIFIER: Randy Oneal is a 53 y.o. male with a past medical history of T2DM and HTN. The patient has followed with Endocrinology clinic since 10/21/2018 for consultative assistance with management of his diabetes.  DIABETIC HISTORY:  Randy Oneal was diagnosed with T2DM in the year 2000, he has been on Metformin, historically his control has been poor due to non-compliance but pt became motivated after his prolonged hospitalization for chest wall infection in [place], 2020.  He has been on insulin therapy for years. His hemoglobin A1c has ranged from 13.0% in 08/2018, peaking at 16.8% in 2017   On his initial presentation to our clinic his A1c 13.0% and he was on Tresiba and Humalog.     SUBJECTIVE:   During the last visit (10/20/2018): We continued MDI regimen, reduced prandial dose with breakfast   Today (02/16/2019): Randy Oneal is here for a 3 month follow up on his diabetes. He checks his blood sugars 3 times daily, preprandial . The patient has  had hypoglycemic episodes since the last clinic visit, which typically occur 1 x /day - most often occurring during the day (lunch time). The patient is symptomatic with these episodes, with symptoms of weak, shaky. Otherwise, the patient has not required any recent emergency interventions for hypoglycemia and has not had recent hospitalizations secondary to hyper or hypoglycemic episodes.    ROS: As per HPI and as detailed below: Review of Systems  Constitutional: Negative for chills and fever.  HENT: Negative for congestion and sore throat.   Respiratory: Negative for cough and shortness of breath.   Cardiovascular: Negative for chest pain and palpitations.      HOME DIABETES REGIMEN:  Tresiba 24 units daily - Taking 21 units  Humalog 7 units TID QAC    GLUCOSE LOG:  Date  Breakfast  Lunch  Supper  11/16/2018 213 60 104  4/6 163 58 120  4/5 79 104 112     HISTORY:  Past Medical History:  Past Medical History:   Diagnosis Date  . Diabetes mellitus    Insulin dependent  . Diabetic retinopathy of both eyes ([place]) 01/31/2019   Dr Coralyn Pear 01/2019  . Hypertension   . Renal insufficiency   . Sepsis due to Streptococcus, group B ([place]) 10/05/2018   Past Surgical History:  Past Surgical History:  Procedure Laterality Date  . APPLICATION OF WOUND VAC Left 08/27/2018   Procedure: APPLICATION OF WOUND VAC, left neck;  Surgeon: Gaye Pollack, MD;  Location: [place];  Service: Thoracic;  Laterality: Left;  . APPLICATION OF WOUND VAC Left 08/30/2018   Procedure: APPLICATION OF WOUND VAC;  Surgeon: Gaye Pollack, MD;  Location: [place];  Service: Thoracic;  Laterality: Left;  . CATARACT EXTRACTION W/ INTRAOCULAR LENS  IMPLANT, BILATERAL    . COLONOSCOPY  06/24/2011   Procedure: COLONOSCOPY;  Surgeon: [name], MD;  [facility];  Service: Endoscopy;  Laterality: N/A;  8:30 AM  . EYE SURGERY    . STERNAL WOUND DEBRIDEMENT Left 08/27/2018   Procedure: Incision and DEBRIDEMENT Left Chest, Neck and Mediastinum;  Surgeon: Gaye Pollack, MD;  Location: [facility] OR;  Service: Thoracic;  Laterality: Left;  . STERNAL WOUND DEBRIDEMENT Left 08/30/2018   Procedure: WOUND VAC CHANGE, LEFT CHEST AND NECK, POSSIBLE DEBRIDEMENT;  Surgeon: Gaye Pollack, MD;  Location: [place] OR;  Service: Thoracic;  Laterality: Left;    Social History:  reports that he has never smoked. He  has never used smokeless tobacco. He reports that he does not drink alcohol or use drugs. Family History:  Family History  Problem Relation Age of Onset  . Hypertension Mother   . Colon cancer Neg Hx      HOME MEDICATIONS: Allergies as of 02/16/2019      Reactions   Tramadol Nausea And Vomiting   Nausea and vomiting      Medication List       Accurate as of February 16, 2019  7:53 AM. If you have any questions, ask your nurse or doctor.        amLODipine 5 MG tablet Commonly known as: NORVASC Take one tablet by mouth each day What  changed:   how much to take  how to take this  when to take this  additional instructions   atorvastatin 10 MG tablet Commonly known as: LIPITOR TAKE ONE TABLET BY MOUTH ONCE DAILY. What changed: when to take this   [place] Use one sensor every 10 days.   furosemide 40 MG tablet Commonly known as: LASIX Take 1 tablet (40 mg total) by mouth daily as needed. Take for weight gain of 3-5 lbs What changed: reasons to take this   glucose blood test strip Commonly known as: Visual merchandiser Next Test USE TO TEST FOUR TIMES DAILY.   insulin lispro 100 UNIT/ML KwikPen Commonly known as: HumaLOG KwikPen Inject 0.06 mLs (6 Units total) into the skin daily with breakfast AND 0.07 mLs (7 Units total) 2 (two) times daily before lunch and supper. What changed: See the new instructions.   Insulin Pen Needle 32G X 4 MM Misc Commonly known as: BD Pen Needle Nano 2nd Gen Four times daily   metoprolol tartrate 50 MG tablet Commonly known as: LOPRESSOR Take one half tablet by mouth twice daily What changed:   how much to take  how to take this  when to take this  additional instructions   Microlet Lancets Misc USE FOUR TIMES A DAY AS DIRECTED.   nystatin powder Commonly known as: MYCOSTATIN/NYSTOP Apply topically 2 (two) times daily. To groin and scrotum   pantoprazole 40 MG tablet Commonly known as: Protonix Take 1 tablet (40 mg total) by mouth daily.   potassium chloride SA 20 MEQ tablet Commonly known as: K-DUR Take 1 tablet (20 mEq total) by mouth daily as needed. Only take on days you use Lasix   sildenafil 20 MG tablet Commonly known as: REVATIO May take up to 5 before relations as directed What changed:   how much to take  how to take this  when to take this  reasons to take this  additional instructions   Tresiba FlexTouch 100 UNIT/ML Sopn FlexTouch Pen Generic drug: insulin degludec Inject 0.19 mLs (19 Units total) into the  skin at bedtime. What changed: how much to take          DATA REVIEWED:  Lab Results  Component Value Date   HGBA1C 7.7 (H) 02/15/2019   HGBA1C 13.0 (A) 08/16/2018   HGBA1C >15.5 (H) 02/04/2018   Lab Results  Component Value Date   MICROALBUR 24.5 11/05/2015   LDLCALC 106 (H) 02/04/2018   CREATININE 3.31 (H) 02/15/2019   Lab Results  Component Value Date   MICRALBCREAT 383 (H) 11/05/2015     Lab Results  Component Value Date   CHOL 183 02/04/2018   HDL 65 02/04/2018   LDLCALC 106 (H) 02/04/2018   TRIG 62 02/04/2018  CHOLHDL 2.8 02/04/2018         ASSESSMENT / PLAN / RECOMMENDATIONS:   1) Type 2 Diabetes Mellitus, Poorly controlled, With CKD Oneal ,neuropathic and retinopathic complications - Most recent A1c of 13.0 %. Goal A1c < 7.0 %.   Plan:  - Praised the patient on dietary discretions,medication adherence and keeping up with glucose checks.  - He has been noted to have hypoglycemia at lunch time, hence will reduce his humalog dose for Breakfast as below - Pt tends to snack at bedtime , hence intermittent hyperglycemia in the morning, discussed low carb snacks, if he continues to snack at bedtime we could offer him a small dose of humalog with the snack - Based on a fasting glucose of 79 mg/dL, will reduce his basal insulin  - We discussed considering adding on a GLP-1 agonist to see if we would be able to stop his prandial insulin .  - We reviewed rule 15 in details for hypoglycemia       MEDICATIONS:  Decrease Tresiba to 19 units daily  Decrease Humalog to 6 units with Breakfast   Continue Humalog 7 units with Lunch and supper   EDUCATION / INSTRUCTIONS:  BG monitoring instructions: Patient is instructed to check his blood sugars 4 times a day, fasting and bedtime.  Call [facility] if: BG persistently < 70 or > 300. . I reviewed the Rule of 15 for the treatment of hypoglycemia in detail with the patient.      I discussed  the assessment and treatment plan with the patient. The patient was provided an opportunity to ask questions and all were answered. The patient agreed with the plan and demonstrated an understanding of the instructions.   The patient was advised to call back or seek an in-person evaluation if the symptoms worsen or if the condition fails to improve as anticipated.  I provided 12 minutes of non-face-to-face time during this encounter.     F/U in 3 months    Signed electronically by: Mack Guise, MD  [facility] End[facility]  [address] [place], [place] [address] Phone: [phone] FAX: [phone]   CC: [name], [place] [address] Phone: [phone]  Fax: [phone]  Return to Endocrinology clinic as below: Future Appointments  Date Time Provider [place]  02/16/2019  9:30 AM Shamleffer, Melanie Crazier, MD [facility] None  02/17/2019  3:15 PM Susy Frizzle, PTA AP-REHP None  02/21/2019  2:15 PM MC-SCREENING MC-SDSC None  02/25/2019  8:00 AM Bernarda Caffey, MD TRE-TRE None  02/28/2019  3:30 PM Teena Irani, PTA AP[facility]RE[facility] None  03/03/2019  3:15 PM Jacques Navy, PT AP-REHP None  03/07/2019  3:15 PM Jacques Navy, PT AP-REHP None  03/11/2019  4:30 PM Teena Irani, PTA AP-REHP None

## 2019-02-16 NOTE — Telephone Encounter (Signed)
I did discuss this with the patient he will be doing additional testing

## 2019-02-16 NOTE — Progress Notes (Signed)
   Subjective:    Patient ID: Randy Oneal, male    DOB: 09-03-1965, 53 y.o.   MRN: [id number]  HPI Pt here today for discussion of lab results and to check urine Patient is a long-term diabetic hypertensive who has through the years not done a good job keeping his diabetes under good control recently he is new noted some pretty significant health issues and was in the hospital for surgery infection and now having retinal bleeding and needing to have surgery coming up on recent preoperative labs his creatinine was significantly elevated patient states he has been eating okay drinking okay try and keep sugars under good control in addition to this he has not been on any particular medicines other than what is on his med list he does not have any family history of kidney disease in denies any recent high fevers chills but he was on IV antibiotics for his infection  Review of Systems  Constitutional: Negative for activity change.  HENT: Negative for congestion and rhinorrhea.   Respiratory: Negative for cough and shortness of breath.   Cardiovascular: Negative for chest pain.  Gastrointestinal: Negative for abdominal pain, diarrhea, nausea and vomiting.  Genitourinary: Negative for dysuria and hematuria.  Neurological: Negative for weakness and headaches.  Psychiatric/Behavioral: Negative for behavioral problems and confusion.       Objective:   Physical Exam Vitals signs reviewed.  Constitutional:      General: He is not in acute distress.    Appearance: He is well-developed.  HENT:     Head: Normocephalic.  Cardiovascular:     Rate and Rhythm: Normal rate and regular rhythm.     Heart sounds: Normal heart sounds. No murmur.  Pulmonary:     Effort: Pulmonary effort is normal.     Breath sounds: Normal breath sounds.  Lymphadenopathy:     Cervical: No cervical adenopathy.  Skin:    General: Skin is warm and dry.  Neurological:     Mental Status: He is alert.  Psychiatric:        Behavior: Behavior normal.      25 minutes was spent with the patient.  This statement verifies that 25 minutes was indeed spent with the patient.  More than 50% of this visit-total duration of the visit-was spent in counseling and coordination of care. The issues that the patient came in for today as reflected in the diagnosis (s) please refer to documentation for further details.      Assessment & Plan:  Acute kidney dysfunction It is hard to know if this is due to his diabetes or possibly he had renal injury from that medications he was on to treat his infection Nonetheless we will repeat the metabolic 7 encourage patient to keep sugars under good control we will wait repeat met 7 as well as urine ACR may well need to do a renal ultrasound as well as get a consultation with nephrology I believe the patient can go ahead with the surgery but will need intensive follow-up regarding his kidneys Multiple questions answered to the patient and his family  Blood pressure subpar control increase amlodipine new dose 10 mg daily may get pedal edema with this patient more and call us if any problems

## 2019-02-17 ENCOUNTER — Ambulatory Visit (HOSPITAL_COMMUNITY): Payer: Managed Care, Other (non HMO)

## 2019-02-17 ENCOUNTER — Encounter (HOSPITAL_COMMUNITY): Payer: Self-pay

## 2019-02-17 ENCOUNTER — Other Ambulatory Visit: Payer: Self-pay

## 2019-02-17 ENCOUNTER — Telehealth: Payer: Self-pay | Admitting: Family Medicine

## 2019-02-17 DIAGNOSIS — M6281 Muscle weakness (generalized): Secondary | ICD-10-CM

## 2019-02-17 DIAGNOSIS — R262 Difficulty in walking, not elsewhere classified: Secondary | ICD-10-CM

## 2019-02-17 NOTE — Telephone Encounter (Signed)
Wife(DPR) stated that patient felt like his sugar was dropping yesterday so they did not do the labs but went to eat but the patient did have his labs drawn this am.

## 2019-02-17 NOTE — Therapy (Addendum)
[facility] [address] [place], [place], [address] Phone: [phone]   Fax:  [phone]  Physical Therapy Treatment Progress Note Reporting Period 01/21/19 to 02/17/19  See note below for Objective Data and Assessment of Progress/Goals.   Patient was re-assessed by Randy Oneal, PTA this session. I agree with her assessment based on objective findings. Patient has made good progress towards and was able to ambulate at 0.8 m/s during the 2 MWT. He has made limited progress with LE strength via MMT and continues to have balance impairments. He will continue to benefit from skilled PT interventions to progress towards goals.   Randy Oneal, PT, DPT, [facility] Physical Therapist with [facility]    Patient Details  Name: Randy Oneal MRN: [id number] Date of Birth: [birth year] Referring Provider (PT): Norva Karvonen, MD   Encounter Date: 02/17/2019  PT End of Session - 02/17/19 1533    Visit Number  10   Minireassessment complete visit #10 on 02/17/19   Number of Visits  24    Date for PT Re-Evaluation  03/11/19    Authorization Type  [employer] Managed (20 visit limit per calander year, 0 used at eval, no auth required)    Authorization Time Period  01/21/2019-03/11/19    Authorization - Visit Number  10    Authorization - Number of Visits  20    PT Start Time  1533    PT Stop Time  1603    PT Time Calculation (min)  30 min    Equipment Utilized During Treatment  Gait belt    Activity Tolerance  Patient tolerated treatment well;Patient limited by fatigue    Behavior During Therapy  [facility] for tasks assessed/performed       Past Medical History:  Diagnosis Date  . Diabetes mellitus    Insulin dependent  . Diabetic retinopathy of both eyes ([place]) 01/31/2019   Dr Coralyn Pear 01/2019  . Hypertension   . Renal insufficiency   . Sepsis due to Streptococcus, group B ([place]) 10/05/2018    Past Surgical History:  Procedure  Laterality Date  . APPLICATION OF WOUND VAC Left 08/27/2018   Procedure: APPLICATION OF WOUND VAC, left neck;  Surgeon: Gaye Pollack, MD;  Location: [place];  Service: Thoracic;  Laterality: Left;  . APPLICATION OF WOUND VAC Left 08/30/2018   Procedure: APPLICATION OF WOUND VAC;  Surgeon: Gaye Pollack, MD;  Location: [place];  Service: Thoracic;  Laterality: Left;  . CATARACT EXTRACTION W/ INTRAOCULAR LENS  IMPLANT, BILATERAL    . COLONOSCOPY  06/24/2011   Procedure: COLONOSCOPY;  Surgeon: [name], MD;  [facility];  Service: Endoscopy;  Laterality: N/A;  8:30 AM  . EYE SURGERY    . STERNAL WOUND DEBRIDEMENT Left 08/27/2018   Procedure: Incision and DEBRIDEMENT Left Chest, Neck and Mediastinum;  Surgeon: Gaye Pollack, MD;  Location: [facility] OR;  Service: Thoracic;  Laterality: Left;  . STERNAL WOUND DEBRIDEMENT Left 08/30/2018   Procedure: WOUND VAC CHANGE, LEFT CHEST AND NECK, POSSIBLE DEBRIDEMENT;  Surgeon: Gaye Pollack, MD;  Location: [facility];  Service: Thoracic;  Laterality: Left;    There were no vitals filed for this visit.      [facility] PT Assessment - 02/17/19 0001      Assessment   Medical Diagnosis  Leg Pain (Weakness)    Referring Provider (PT)  Norva Karvonen, MD    Onset Date/Surgical Date  09/29/18  approximate   Hand Dominance  Right    Prior Therapy  some in the hospital from [place]      Precautions   Precautions  Fall      Functional Tests   Functional tests  Single leg stance      Single Leg Stance   Comments  Rt 2", Lt 2" max of 5 attempts   was unable to SLS on BIL LE's (0")     Posture/Postural Control   Posture/Postural Control  Postural limitations    Postural Limitations  Forward head;Decreased lumbar lordosis      Strength   Right Hip Flexion  4/5   was 4/5   Right Hip Extension  4/5   was 4/5   Right Hip ABduction  3+/5   was 3+   Left Hip Flexion  4/5   was 4/5   Left Hip Extension  4/5   was 4/5   Left Hip  ABduction  3+/5   was 3+/5   Right/Left Knee  Right;Left    Right Knee Flexion  3+/5   was 3+/5   Right Knee Extension  4+/5   was 4/5   Left Knee Flexion  4-/5   was 3+   Left Knee Extension  4+/5   was 4/5   Right Ankle Dorsiflexion  4/5   was 4/5   Left Ankle Dorsiflexion  4/5   was 4/5     Ambulation/Gait   Ambulation/Gait Assistance  4: Min guard    Ambulation Distance (Feet)  316 Feet   was 200 ft   Assistive device  Straight cane    Gait Pattern  Step-through pattern;Decreased arm swing - left;Decreased stride length;Decreased hip/knee flexion - right;Decreased hip/knee flexion - left;Trendelenburg;Narrow base of support    Ambulation Surface  Level;Indoor    Gait velocity  .80 m/s    Stairs  Yes    Stairs Assistance  4: Min guard    Stairs Assistance Details (indicate cue type and reason)  min A to steady    Stair Management Technique  One rail Right;Two rails;Alternating pattern   1 HR ascending, 2 descending   Number of Stairs  4    Height of Stairs  7                   [facility] Adult PT Treatment/Exercise - 02/17/19 0001      Knee/Hip Exercises: Machines for Strengthening   Total Gym Leg Press  BodyCraft Leg Press: 2x 15 reps, 40lbs      Knee/Hip Exercises: Standing   Heel Raises  Both;Limitations;15 reps;1 set    Heel Raises Limitations  on decline, 3 sec holds    Stairs  alternating stairs 4in then 7in wiht 1 HR ascend and 2 HR descending    SLS  bil, using mirror and intermittent 1 finger HHA, max of 2" each    Gait Training  2MWT: 316 ft with SPC    Other Standing Knee Exercises  2RT sidestepping down blue line with RTB, no AD                PT Short Term Goals - 02/17/19 1538      PT SHORT TERM GOAL #1   Title  Patient will be independent with HEP, updated PRN, to improve functional strength to improve mobility and return to PLOF.    Baseline  02/17/19: Reports compliance 2x/daily      PT SHORT TERM GOAL #2  Title  Patient will  improve LE strength by 1 grade with MMT to indicate significant improvement in strength.    Baseline  02/17/19: see MMT    Status  On-going      PT SHORT TERM GOAL #3   Title  Patient will perform SLS for 10 seconds on bil LE to indicate improved glut med strangth and improved balance for stair mobility and gait.    Baseline  02/17/19: max 2" Bil LE    Status  On-going      PT SHORT TERM GOAL #4   Title  Patient will ambulate at 0.8 m/s or faster with LRAD during 2 MWT to indicate improved endurance and gait.    Baseline  02/17/19:2MWT velocity .34196 m/s    Status  Achieved        PT Long Term Goals - 02/17/19 1608      PT LONG TERM GOAL #1   Title  Patient will ambulate at 0.8 m/s or faster with LRAD during 6 MWT to indicate improved endurance and gait.    Baseline  02/17/19: only 2MWT complete    Status  On-going      PT LONG TERM GOAL #2   Title  Patient will ascend/descend 4 6" stairs with no hand rail at independent level with no overt LOB.    Baseline  02/17/19: ascend wiht 1 HR and descends with 2 for safety    Status  On-going      PT LONG TERM GOAL #3   Title  Patient will report no low back or posterior leg pain for 2 week period that is above 3/10 indicating improved activity tolerance.    Baseline  02/17/19:  Not assessed, no c/o of pain this session.    Status  On-going            Plan - 02/17/19 1612    Clinical Impression Statement  Pt late for apt today, unable to complete full POC.  This session reviewed goals, MMT, 2MWT and balance assessed with the following findings:  Pt reports compliance with HEP daily.  Pt continues to exhibit weak hip muscualutre and hamstrings, quad strength has improved though.  Increased distance during 2MWT with SPC min guard, min LOB episodes during turns required min A for safety (2 LOB during test).  Pt able to ascend/descend stairs with reciprocal pattern, does require handrail assistance for safety.  Balance continues to be very  difficult, able to SLS 2" tops.  Pt will continue to benefit from PT to address goals unmet.    Personal Factors and Comorbidities  Fitness;Comorbidity 2    Comorbidities  DM, HTN, recent sepsis with prolonged hospitalization and weight loss    Examination-Activity Limitations  Bathing;Stairs;Stand;Lift;Locomotion Level    Examination-Participation Restrictions  Laundry;Yard Work;Driving;Community Activity;Cleaning    Stability/Clinical Decision Making  Stable/Uncomplicated    Clinical Decision Making  Low    Rehab Potential  Good    PT Frequency  3x / week    PT Duration  8 weeks    PT Treatment/Interventions  ADLs/Self Care Home Management;Aquatic Therapy;Cryotherapy;Electrical Stimulation;Moist Heat;Gait training;DME Instruction;Stair training;Functional mobility training;Therapeutic activities;Therapeutic exercise;Balance training;Neuromuscular re-education;Patient/family education;Manual techniques;Passive range of motion;Dry needling;Taping    PT Next Visit Plan  Progress weight with leg press PRN. Continue to increase LE strengthening and balance.  Continue with machines for strengthening. Progress balance and challenge as able. Continue gait training without AD.    PT Home Exercise Plan  Eval: clamshell with band, seated hamstring  stretch  6/16:  standing heelraises, toeraises; 02/08/19: standing hip abd/ext infront of sink/counter with RTB around thigh       Patient will benefit from skilled therapeutic intervention in order to improve the following deficits and impairments:  Abnormal gait, Decreased activity tolerance, Decreased balance, Decreased mobility, Difficulty walking, Decreased endurance, Decreased coordination, Decreased strength, Pain, Postural dysfunction, Impaired flexibility  Visit Diagnosis: 1. Difficulty in walking, not elsewhere classified   2. Muscle weakness (generalized)        Problem List Patient Active Problem List   Diagnosis Date Noted  . Diabetic  retinopathy of both eyes ([place]) 01/31/2019  . Type 2 diabetes mellitus with hyperglycemia, with long-term current use of insulin ([place]) 10/20/2018  . Sepsis due to Streptococcus, group B ([place]) 10/05/2018  . Dietary counseling and surveillance 09/15/2018  . Non compliance with medical treatment 09/15/2018  . Open wound of right great toe   . Septic arthritis of left sternoclavicular joint ([place])   . Mediastinal abscess ([place]) 08/27/2018  . Cellulitis of great toe, right   . Diabetic hyperosmolar non-ketotic state ([place]) 08/17/2018  . Hyperglycemia 08/17/2018  . AKI (acute kidney injury) ([place]) 08/16/2018  . Hyponatremia 08/16/2018  . Hyperlipidemia associated with type 2 diabetes mellitus ([place]) 03/04/2017  . Erectile dysfunction 03/04/2017  . Personal history of noncompliance with medical treatment, presenting hazards to health 11/22/2015  . HTN (hypertension), benign 01/30/2014  . Loss of weight 01/30/2014  . Type 2 diabetes mellitus not at goal [facility]) 03/30/2013   Randy Oneal, [place]; [place]  Aldona Lento 02/17/2019, 6:24 PM  [facility] [address] [place], [place], [address] Phone: [phone]   Fax:  [phone]  Name: Randy Oneal MRN: [id number] Date of Birth: Apr 30, 1966

## 2019-02-17 NOTE — Telephone Encounter (Signed)
Nurses Patient was instructed to go do his labs yesterday It does not look like he did them Please call patient encouraged him to get the labs done ASAP Please go ahead and put urgent referral in for nephrology but these labs need to get done

## 2019-02-17 NOTE — Telephone Encounter (Signed)
Ok so noted

## 2019-02-18 LAB — BASIC METABOLIC PANEL
BUN/Creatinine Ratio: 11 (ref 9–20)
BUN: 31 mg/dL — ABNORMAL HIGH (ref 6–24)
CO2: 25 mmol/L (ref 20–29)
Calcium: 8.6 mg/dL — ABNORMAL LOW (ref 8.7–10.2)
Chloride: 105 mmol/L (ref 96–106)
Creatinine, Ser: 2.78 mg/dL — ABNORMAL HIGH (ref 0.76–1.27)
GFR calc Af Amer: 29 mL/min/{1.73_m2} — ABNORMAL LOW (ref >59)
GFR calc non Af Amer: 25 mL/min/{1.73_m2} — ABNORMAL LOW (ref >59)
Glucose: 186 mg/dL — ABNORMAL HIGH (ref 65–99)
Potassium: 4.8 mmol/L (ref 3.5–5.2)
Sodium: 141 mmol/L (ref 134–144)

## 2019-02-18 LAB — MICROALBUMIN / CREATININE URINE RATIO
Creatinine, Urine: 57.4 mg/dL
Microalb/Creat Ratio: 3846 mg/g creat — ABNORMAL HIGH (ref 0–29)
Microalbumin, Urine: 2207.8 ug/mL

## 2019-02-18 NOTE — Anesthesia Preprocedure Evaluation (Addendum)
Anesthesia Evaluation  Patient identified by MRN, date of birth, ID band Patient awake    Reviewed: Allergy & Precautions, H&P , NPO status , Patient's Chart, lab work & pertinent test results, reviewed documented beta blocker date and time   Airway Mallampati: II  TM Distance: >3 FB Neck ROM: Full    Dental no notable dental hx. (+) Teeth Intact, Dental Advisory Given   Pulmonary neg pulmonary ROS,    Pulmonary exam normal breath sounds clear to auscultation       Cardiovascular hypertension, Pt. on medications and Pt. on home beta blockers  Rhythm:Regular Rate:Normal     Neuro/Psych negative neurological ROS  negative psych ROS   GI/Hepatic negative GI ROS, Neg liver ROS,   Endo/Other  diabetes, Type 1, Insulin Dependent  Renal/GU Renal InsufficiencyRenal disease  negative genitourinary   Musculoskeletal  (+) Arthritis , Osteoarthritis,    Abdominal   Peds  Hematology negative hematology ROS (+)   Anesthesia Other Findings   Reproductive/Obstetrics negative OB ROS                           Anesthesia Physical Anesthesia Plan  ASA: III  Anesthesia Plan: General   Post-op Pain Management:    Induction: Intravenous  PONV Risk Score and Plan: 3 and Ondansetron, Midazolam and Treatment may vary due to age or medical condition  Airway Management Planned: Oral ETT  Additional Equipment:   Intra-op Plan:   Post-operative Plan: Extubation in OR  Informed Consent: I have reviewed the patients History and Physical, chart, labs and discussed the procedure including the risks, benefits and alternatives for the proposed anesthesia with the patient or authorized representative who has indicated his/her understanding and acceptance.     Dental advisory given  Plan Discussed with: CRNA  Anesthesia Plan Comments: (See PAT note written by Myra Gianotti, PA-C. For repeat STAT BMET on the  day of surgery.  )       Anesthesia Quick Evaluation

## 2019-02-19 ENCOUNTER — Other Ambulatory Visit: Payer: Self-pay | Admitting: Family Medicine

## 2019-02-21 ENCOUNTER — Other Ambulatory Visit (HOSPITAL_COMMUNITY)
Admission: RE | Admit: 2019-02-21 | Discharge: 2019-02-21 | Disposition: A | Discharge status: Home / Self Care | Payer: Managed Care, Other (non HMO) | Source: Ambulatory Visit | Attending: Ophthalmology | Admitting: Ophthalmology

## 2019-02-21 ENCOUNTER — Encounter (HOSPITAL_COMMUNITY): Payer: Managed Care, Other (non HMO)

## 2019-02-21 DIAGNOSIS — Z1159 Encounter for screening for other viral diseases: Secondary | ICD-10-CM | POA: Insufficient documentation

## 2019-02-22 ENCOUNTER — Encounter (HOSPITAL_COMMUNITY): Payer: Managed Care, Other (non HMO)

## 2019-02-22 LAB — SARS CORONAVIRUS 2 (TAT 6-24 HRS): SARS Coronavirus 2: NEGATIVE

## 2019-02-23 MED ORDER — BEVACIZUMAB CHEMO INJECTION 1.25MG/0.05ML SYRINGE FOR KALEIDOSCOPE
1.2500 mg | Freq: Once | INTRAVITREAL | Status: DC
  Filled 2019-02-23 (×2): qty 0.1

## 2019-02-23 MED ORDER — BEVACIZUMAB CHEMO INJECTION 1.25MG/0.05ML SYRINGE FOR KALEIDOSCOPE
1.2500 mg | Freq: Once | INTRAVITREAL | Status: DC
  Filled 2019-02-23: qty 0.1

## 2019-02-24 ENCOUNTER — Encounter (HOSPITAL_COMMUNITY): Payer: Managed Care, Other (non HMO)

## 2019-02-24 ENCOUNTER — Encounter (HOSPITAL_COMMUNITY): Payer: Self-pay | Admitting: *Deleted

## 2019-02-24 ENCOUNTER — Other Ambulatory Visit: Payer: Self-pay

## 2019-02-24 ENCOUNTER — Encounter (HOSPITAL_COMMUNITY)
Admission: RE | Disposition: A | Discharge status: Home / Self Care | Payer: Self-pay | Source: Home / Self Care | Attending: Ophthalmology

## 2019-02-24 ENCOUNTER — Ambulatory Visit (HOSPITAL_COMMUNITY): Payer: Managed Care, Other (non HMO) | Admitting: Vascular Surgery

## 2019-02-24 ENCOUNTER — Ambulatory Visit (HOSPITAL_COMMUNITY): Payer: Managed Care, Other (non HMO) | Admitting: Physician Assistant

## 2019-02-24 ENCOUNTER — Ambulatory Visit (HOSPITAL_COMMUNITY)
Admission: RE | Admit: 2019-02-24 | Discharge: 2019-02-24 | Disposition: A | Discharge status: Home / Self Care | Payer: Managed Care, Other (non HMO) | Attending: Ophthalmology | Admitting: Ophthalmology

## 2019-02-24 DIAGNOSIS — E113521 Type 2 diabetes mellitus with proliferative diabetic retinopathy with traction retinal detachment involving the macula, right eye: Secondary | ICD-10-CM | POA: Diagnosis not present

## 2019-02-24 DIAGNOSIS — M1990 Unspecified osteoarthritis, unspecified site: Secondary | ICD-10-CM | POA: Diagnosis not present

## 2019-02-24 DIAGNOSIS — E103531 Type 1 diabetes mellitus with proliferative diabetic retinopathy with traction retinal detachment not involving the macula, right eye: Secondary | ICD-10-CM | POA: Diagnosis not present

## 2019-02-24 DIAGNOSIS — N289 Disorder of kidney and ureter, unspecified: Secondary | ICD-10-CM | POA: Insufficient documentation

## 2019-02-24 DIAGNOSIS — H4311 Vitreous hemorrhage, right eye: Secondary | ICD-10-CM | POA: Insufficient documentation

## 2019-02-24 DIAGNOSIS — Z794 Long term (current) use of insulin: Secondary | ICD-10-CM | POA: Insufficient documentation

## 2019-02-24 DIAGNOSIS — I10 Essential (primary) hypertension: Secondary | ICD-10-CM | POA: Diagnosis not present

## 2019-02-24 HISTORY — PX: PHOTOCOAGULATION WITH LASER: SHX6027

## 2019-02-24 HISTORY — PX: 25 GAUGE PARS PLANA VITRECTOMY WITH 20 GAUGE MVR PORT FOR MACULAR HOLE: SHX6096

## 2019-02-24 HISTORY — PX: MEMBRANE PEEL: SHX5967

## 2019-02-24 HISTORY — PX: INJECTION OF SILICONE OIL: SHX6422

## 2019-02-24 LAB — BASIC METABOLIC PANEL
Anion gap: 7 (ref 5–15)
BUN: 35 mg/dL — ABNORMAL HIGH (ref 6–20)
CO2: 23 mmol/L (ref 22–32)
Calcium: 9 mg/dL (ref 8.9–10.3)
Chloride: 111 mmol/L (ref 98–111)
Creatinine, Ser: 2.98 mg/dL — ABNORMAL HIGH (ref 0.61–1.24)
GFR calc Af Amer: 27 mL/min — ABNORMAL LOW (ref >60)
GFR calc non Af Amer: 23 mL/min — ABNORMAL LOW (ref >60)
Glucose, Bld: 139 mg/dL — ABNORMAL HIGH (ref 70–99)
Potassium: 4.3 mmol/L (ref 3.5–5.1)
Sodium: 141 mmol/L (ref 135–145)

## 2019-02-24 LAB — GLUCOSE, CAPILLARY
Glucose-Capillary: 138 mg/dL — ABNORMAL HIGH (ref 70–99)
Glucose-Capillary: 106 mg/dL — ABNORMAL HIGH (ref 70–99)

## 2019-02-24 SURGERY — 25 GAUGE PARS PLANA VITRECTOMY WITH 20 GAUGE MVR PORT FOR MACULAR HOLE
Anesthesia: General | Site: Eye | Laterality: Right

## 2019-02-24 MED ORDER — BSS PLUS IO SOLN
INTRAOCULAR | Status: AC
  Filled 2019-02-24: qty 500

## 2019-02-24 MED ORDER — ONDANSETRON HCL 4 MG/2ML IJ SOLN
INTRAMUSCULAR | Status: DC | PRN
  Administered 2019-02-24: 4 mg via INTRAVENOUS

## 2019-02-24 MED ORDER — SODIUM CHLORIDE (PF) 0.9 % IJ SOLN
INTRAMUSCULAR | Status: AC
  Filled 2019-02-24: qty 10

## 2019-02-24 MED ORDER — BUPIVACAINE HCL (PF) 0.75 % IJ SOLN
INTRAMUSCULAR | Status: AC
  Filled 2019-02-24: qty 10

## 2019-02-24 MED ORDER — STERILE WATER FOR INJECTION IJ SOLN
INTRAMUSCULAR | Status: AC
  Filled 2019-02-24: qty 10

## 2019-02-24 MED ORDER — HYDROMORPHONE HCL 1 MG/ML IJ SOLN
INTRAMUSCULAR | Status: AC
  Filled 2019-02-24: qty 1

## 2019-02-24 MED ORDER — ONDANSETRON HCL 4 MG/2ML IJ SOLN
INTRAMUSCULAR | Status: AC
  Filled 2019-02-24: qty 2

## 2019-02-24 MED ORDER — LIDOCAINE 2% (20 MG/ML) 5 ML SYRINGE
INTRAMUSCULAR | Status: AC
  Filled 2019-02-24: qty 5

## 2019-02-24 MED ORDER — TRIAMCINOLONE ACETONIDE 40 MG/ML IJ SUSP
INTRAMUSCULAR | Status: AC
  Filled 2019-02-24: qty 5

## 2019-02-24 MED ORDER — LIDOCAINE HCL (PF) 2 % IJ SOLN
INTRAMUSCULAR | Status: AC
  Filled 2019-02-24: qty 10

## 2019-02-24 MED ORDER — ACETAMINOPHEN 500 MG PO TABS
ORAL_TABLET | ORAL | Status: AC
  Administered 2019-02-24: 11:00:00 1000 mg via ORAL
  Filled 2019-02-24: qty 2

## 2019-02-24 MED ORDER — SODIUM CHLORIDE 0.9 % IV SOLN
INTRAVENOUS | Status: DC | PRN
  Administered 2019-02-24 (×2): via INTRAVENOUS

## 2019-02-24 MED ORDER — BSS IO SOLN
INTRAOCULAR | Status: AC
  Filled 2019-02-24: qty 15

## 2019-02-24 MED ORDER — NA CHONDROIT SULF-NA HYALURON 40-30 MG/ML IO SOLN
INTRAOCULAR | Status: AC
  Filled 2019-02-24: qty 1

## 2019-02-24 MED ORDER — PREDNISOLONE ACETATE 1 % OP SUSP
OPHTHALMIC | Status: AC
  Filled 2019-02-24: qty 5

## 2019-02-24 MED ORDER — DEXAMETHASONE SODIUM PHOSPHATE 10 MG/ML IJ SOLN
INTRAMUSCULAR | Status: AC
  Filled 2019-02-24: qty 1

## 2019-02-24 MED ORDER — TRIAMCINOLONE ACETONIDE 40 MG/ML IJ SUSP
INTRAMUSCULAR | Status: DC | PRN
  Administered 2019-02-24: 40 mg

## 2019-02-24 MED ORDER — DEXTROSE 5 % IV SOLN
INTRAVENOUS | Status: AC
  Filled 2019-02-24: qty 100

## 2019-02-24 MED ORDER — PROPARACAINE HCL 0.5 % OP SOLN
1.0000 [drp] | OPHTHALMIC | Status: AC | PRN
  Administered 2019-02-24 (×3): 1 [drp] via OPHTHALMIC
  Filled 2019-02-24: qty 15

## 2019-02-24 MED ORDER — TROPICAMIDE 1 % OP SOLN
1.0000 [drp] | OPHTHALMIC | Status: AC | PRN
  Administered 2019-02-24 (×3): 1 [drp] via OPHTHALMIC
  Filled 2019-02-24: qty 15

## 2019-02-24 MED ORDER — PROPOFOL 10 MG/ML IV BOLUS
INTRAVENOUS | Status: DC | PRN
  Administered 2019-02-24: 130 mg via INTRAVENOUS

## 2019-02-24 MED ORDER — LIDOCAINE HCL (PF) 4 % IJ SOLN
INTRAMUSCULAR | Status: AC
  Filled 2019-02-24: qty 5

## 2019-02-24 MED ORDER — CEFTAZIDIME 1 G IJ SOLR
INTRAMUSCULAR | Status: AC
  Filled 2019-02-24: qty 1

## 2019-02-24 MED ORDER — PHENYLEPHRINE HCL 10 % OP SOLN
1.0000 [drp] | OPHTHALMIC | Status: AC | PRN
  Administered 2019-02-24 (×3): 1 [drp] via OPHTHALMIC
  Filled 2019-02-24: qty 5

## 2019-02-24 MED ORDER — SODIUM CHLORIDE 0.9 % IV SOLN
INTRAVENOUS | Status: DC | PRN
  Administered 2019-02-24: 40 ug/min via INTRAVENOUS

## 2019-02-24 MED ORDER — MIDAZOLAM HCL 2 MG/2ML IJ SOLN
INTRAMUSCULAR | Status: AC
  Filled 2019-02-24: qty 2

## 2019-02-24 MED ORDER — ATROPINE SULFATE 1 % OP SOLN
OPHTHALMIC | Status: DC | PRN
  Administered 2019-02-24: 1 [drp] via OPHTHALMIC

## 2019-02-24 MED ORDER — TOBRAMYCIN-DEXAMETHASONE 0.3-0.1 % OP OINT
TOPICAL_OINTMENT | OPHTHALMIC | Status: AC
  Filled 2019-02-24: qty 3.5

## 2019-02-24 MED ORDER — FENTANYL CITRATE (PF) 250 MCG/5ML IJ SOLN
INTRAMUSCULAR | Status: AC
  Filled 2019-02-24: qty 5

## 2019-02-24 MED ORDER — NA CHONDROIT SULF-NA HYALURON 40-30 MG/ML IO SOLN
INTRAOCULAR | Status: DC | PRN
  Administered 2019-02-24: 0.5 mL via INTRAOCULAR

## 2019-02-24 MED ORDER — PROMETHAZINE HCL 25 MG/ML IJ SOLN: 6.2500 mg | Freq: Once | INTRAMUSCULAR | Status: DC

## 2019-02-24 MED ORDER — DORZOLAMIDE HCL-TIMOLOL MAL 2-0.5 % OP SOLN
OPHTHALMIC | Status: DC | PRN
  Administered 2019-02-24: 1 [drp] via OPHTHALMIC

## 2019-02-24 MED ORDER — EPINEPHRINE PF 1 MG/ML IJ SOLN
INTRAOCULAR | Status: DC | PRN
  Administered 2019-02-24: 500 mL

## 2019-02-24 MED ORDER — FENTANYL CITRATE (PF) 100 MCG/2ML IJ SOLN
INTRAMUSCULAR | Status: DC | PRN
  Administered 2019-02-24 (×3): 50 ug via INTRAVENOUS

## 2019-02-24 MED ORDER — INDOCYANINE GREEN 25 MG IV SOLR
INTRAVENOUS | Status: AC
  Filled 2019-02-24: qty 25

## 2019-02-24 MED ORDER — PREDNISOLONE ACETATE 1 % OP SUSP
OPHTHALMIC | Status: DC | PRN
  Administered 2019-02-24: 1 [drp] via OPHTHALMIC

## 2019-02-24 MED ORDER — 0.9 % SODIUM CHLORIDE (POUR BTL) OPTIME
TOPICAL | Status: DC | PRN
  Administered 2019-02-24: 200 mL

## 2019-02-24 MED ORDER — POLYMYXIN B SULFATE 500000 UNITS IJ SOLR
INTRAMUSCULAR | Status: AC
  Filled 2019-02-24: qty 500000

## 2019-02-24 MED ORDER — CARBACHOL 0.01 % IO SOLN
INTRAOCULAR | Status: AC
  Filled 2019-02-24: qty 1.5

## 2019-02-24 MED ORDER — LIDOCAINE 2% (20 MG/ML) 5 ML SYRINGE
INTRAMUSCULAR | Status: DC | PRN
  Administered 2019-02-24: 60 mg via INTRAVENOUS

## 2019-02-24 MED ORDER — BALANCED SALT IO SOLN
INTRAOCULAR | Status: DC | PRN
  Administered 2019-02-24: 15 mL via INTRAOCULAR

## 2019-02-24 MED ORDER — ACETAZOLAMIDE SODIUM 500 MG IJ SOLR
INTRAMUSCULAR | Status: AC
  Filled 2019-02-24: qty 500

## 2019-02-24 MED ORDER — ROCURONIUM BROMIDE 10 MG/ML (PF) SYRINGE
PREFILLED_SYRINGE | INTRAVENOUS | Status: AC
  Filled 2019-02-24: qty 10

## 2019-02-24 MED ORDER — MIDAZOLAM HCL 5 MG/5ML IJ SOLN
INTRAMUSCULAR | Status: DC | PRN
  Administered 2019-02-24: 2 mg via INTRAVENOUS

## 2019-02-24 MED ORDER — SCOPOLAMINE 1 MG/3DAYS TD PT72
MEDICATED_PATCH | TRANSDERMAL | Status: AC
  Filled 2019-02-24: qty 1

## 2019-02-24 MED ORDER — EPINEPHRINE PF 1 MG/ML IJ SOLN
INTRAMUSCULAR | Status: AC
  Filled 2019-02-24: qty 1

## 2019-02-24 MED ORDER — STERILE WATER FOR INJECTION IJ SOLN
INTRAMUSCULAR | Status: DC | PRN
  Administered 2019-02-24: 200 mL

## 2019-02-24 MED ORDER — PROPOFOL 10 MG/ML IV BOLUS
INTRAVENOUS | Status: AC
  Filled 2019-02-24: qty 20

## 2019-02-24 MED ORDER — ACETAMINOPHEN 500 MG PO TABS
1000.0000 mg | ORAL_TABLET | Freq: Once | ORAL | Status: AC
  Administered 2019-02-24: 11:00:00 1000 mg via ORAL

## 2019-02-24 MED ORDER — BRIMONIDINE TARTRATE 0.2 % OP SOLN
OPHTHALMIC | Status: DC | PRN
  Administered 2019-02-24: 1 [drp] via OPHTHALMIC

## 2019-02-24 MED ORDER — BACITRACIN-POLYMYXIN B 500-10000 UNIT/GM OP OINT
TOPICAL_OINTMENT | OPHTHALMIC | Status: DC | PRN
  Administered 2019-02-24: 1

## 2019-02-24 MED ORDER — ROCURONIUM BROMIDE 100 MG/10ML IV SOLN
INTRAVENOUS | Status: DC | PRN
  Administered 2019-02-24: 50 mg via INTRAVENOUS

## 2019-02-24 MED ORDER — ATROPINE SULFATE 1 % OP SOLN
OPHTHALMIC | Status: AC
  Filled 2019-02-24: qty 5

## 2019-02-24 MED ORDER — BEVACIZUMAB CHEMO INJECTION 1.25MG/0.05ML SYRINGE FOR KALEIDOSCOPE
INTRAVITREAL | Status: DC | PRN
  Administered 2019-02-24: 1.25 mg via INTRAVITREAL

## 2019-02-24 MED ORDER — DORZOLAMIDE HCL-TIMOLOL MAL 2-0.5 % OP SOLN
OPHTHALMIC | Status: AC
  Filled 2019-02-24: qty 10

## 2019-02-24 MED ORDER — SUCCINYLCHOLINE CHLORIDE 200 MG/10ML IV SOSY
PREFILLED_SYRINGE | INTRAVENOUS | Status: DC | PRN
  Administered 2019-02-24: 100 mg via INTRAVENOUS

## 2019-02-24 MED ORDER — GATIFLOXACIN 0.5 % OP SOLN
OPHTHALMIC | Status: AC
  Filled 2019-02-24: qty 2.5

## 2019-02-24 MED ORDER — BRIMONIDINE TARTRATE 0.2 % OP SOLN
OPHTHALMIC | Status: AC
  Filled 2019-02-24: qty 5

## 2019-02-24 MED ORDER — SUGAMMADEX SODIUM 200 MG/2ML IV SOLN
INTRAVENOUS | Status: DC | PRN
  Administered 2019-02-24: 200 mg via INTRAVENOUS

## 2019-02-24 MED ORDER — ATROPINE SULFATE 1 % OP SOLN
1.0000 [drp] | OPHTHALMIC | Status: AC | PRN
  Administered 2019-02-24 (×3): 1 [drp] via OPHTHALMIC
  Filled 2019-02-24: qty 2

## 2019-02-24 MED ORDER — BACITRACIN-POLYMYXIN B 500-10000 UNIT/GM OP OINT
TOPICAL_OINTMENT | OPHTHALMIC | Status: AC
  Filled 2019-02-24: qty 3.5

## 2019-02-24 MED ORDER — STERILE WATER FOR INJECTION IJ SOLN
INTRAMUSCULAR | Status: DC | PRN
  Administered 2019-02-24: 20 mL

## 2019-02-24 MED ORDER — GATIFLOXACIN 0.5 % OP SOLN OPTIME - NO CHARGE
OPHTHALMIC | Status: DC | PRN
  Administered 2019-02-24: 1 [drp] via OPHTHALMIC

## 2019-02-24 MED ORDER — HYDROMORPHONE HCL 1 MG/ML IJ SOLN
0.2500 mg | INTRAMUSCULAR | Status: DC | PRN
  Administered 2019-02-24 (×2): 0.25 mg via INTRAVENOUS

## 2019-02-24 SURGICAL SUPPLY — 80 items
APL SWBSTK 6 STRL LF DISP (MISCELLANEOUS) ×8
APPLICATOR COTTON TIP 6 STRL (MISCELLANEOUS) ×8 IMPLANT
APPLICATOR COTTON TIP 6IN STRL (MISCELLANEOUS) ×16
BLADE EYE CATARACT 19 1.4 BEAV (BLADE) IMPLANT
BLADE MVR KNIFE 20G (BLADE) ×4 IMPLANT
BNDG EYE OVAL (GAUZE/BANDAGES/DRESSINGS) ×4 IMPLANT
CABLE BIPOLOR RESECTION CORD (MISCELLANEOUS) ×4 IMPLANT
CANNULA ANT CHAM MAIN (OPHTHALMIC RELATED) IMPLANT
CANNULA FLEX TIP 25G (CANNULA) ×4 IMPLANT
CANNULA TROCAR 23 GA VLV (OPHTHALMIC) IMPLANT
CANNULA TROCAR 23G VLV (OPHTHALMIC) IMPLANT
CANNULA VLV SOFT TIP 25G (OPHTHALMIC) IMPLANT
CANNULA VLV SOFT TIP 25GA (OPHTHALMIC) ×4 IMPLANT
CLOSURE STERI-STRIP 1/2X4 (GAUZE/BANDAGES/DRESSINGS) ×1
CLSR STERI-STRIP ANTIMIC 1/2X4 (GAUZE/BANDAGES/DRESSINGS) ×3 IMPLANT
COVER WAND RF STERILE (DRAPES) ×4 IMPLANT
DRAPE MICROSCOPE LEICA 46X105 (MISCELLANEOUS) ×4 IMPLANT
DRAPE OPHTHALMIC 77X100 STRL (CUSTOM PROCEDURE TRAY) ×4 IMPLANT
ERASER HMR WETFIELD 23G BP (MISCELLANEOUS) IMPLANT
FILTER BLUE MILLIPORE (MISCELLANEOUS) IMPLANT
FILTER STRAW FLUID ASPIR (MISCELLANEOUS) IMPLANT
FORCEPS GRIESHABER ILM 25G A (INSTRUMENTS) IMPLANT
GAS AUTO FILL CONSTEL (OPHTHALMIC)
GAS AUTO FILL CONSTELLATION (OPHTHALMIC) IMPLANT
GLOVE BIO SURGEON STRL SZ7.5 (GLOVE) ×8 IMPLANT
GLOVE BIOGEL M 7.0 STRL (GLOVE) ×4 IMPLANT
GOWN STRL REUS W/ TWL LRG LVL3 (GOWN DISPOSABLE) ×4 IMPLANT
GOWN STRL REUS W/ TWL XL LVL3 (GOWN DISPOSABLE) ×2 IMPLANT
GOWN STRL REUS W/TWL LRG LVL3 (GOWN DISPOSABLE) ×8
GOWN STRL REUS W/TWL XL LVL3 (GOWN DISPOSABLE) ×4
KIT BASIN OR (CUSTOM PROCEDURE TRAY) ×4 IMPLANT
KIT PERFLUORON PROCEDURE 5ML (MISCELLANEOUS) IMPLANT
LENS BIOM SUPER VIEW SET DISP (MISCELLANEOUS) IMPLANT
LENS VITRECTOMY FLAT OCLR DISP (MISCELLANEOUS) IMPLANT
MICROPICK 25G (MISCELLANEOUS)
NDL 18GX1X1/2 (RX/OR ONLY) (NEEDLE) ×2 IMPLANT
NDL 25GX 5/8IN NON SAFETY (NEEDLE) ×10 IMPLANT
NDL HYPO 30X.5 LL (NEEDLE) ×4 IMPLANT
NDL PRECISIONGLIDE 27X1.5 (NEEDLE) IMPLANT
NEEDLE 18GX1X1/2 (RX/OR ONLY) (NEEDLE) ×4 IMPLANT
NEEDLE 25GX 5/8IN NON SAFETY (NEEDLE) ×20 IMPLANT
NEEDLE HYPO 30X.5 LL (NEEDLE) ×8 IMPLANT
NEEDLE PRECISIONGLIDE 27X1.5 (NEEDLE) IMPLANT
NS IRRIG 1000ML POUR BTL (IV SOLUTION) ×4 IMPLANT
OIL SILICONE OPHTHALMIC 1000 (Ophthalmic Related) ×2 IMPLANT
PACK VITRECTOMY CUSTOM (CUSTOM PROCEDURE TRAY) ×4 IMPLANT
PAD ARMBOARD 7.5X6 YLW CONV (MISCELLANEOUS) ×8 IMPLANT
PAK PIK VITRECTOMY CVS 25GA (OPHTHALMIC) ×4 IMPLANT
PENCIL BIPOLAR 25GA STR DISP (OPHTHALMIC RELATED) ×4 IMPLANT
PIC ILLUMINATED 25G (OPHTHALMIC) ×4
PICK MICROPICK 25G (MISCELLANEOUS) IMPLANT
PIK ILLUMINATED 25G (OPHTHALMIC) IMPLANT
PROBE DIATHERMY DSP 27GA (MISCELLANEOUS) IMPLANT
PROBE ENDO DIATHERMY 25G (MISCELLANEOUS) ×4 IMPLANT
PROBE LASER ILLUM FLEX CVD 25G (OPHTHALMIC) ×2 IMPLANT
REPL STRA BRUSH NDL (NEEDLE) IMPLANT
REPL STRA BRUSH NEEDLE (NEEDLE) IMPLANT
RESERVOIR BACK FLUSH (MISCELLANEOUS) IMPLANT
RETRACTOR IRIS FLEX 25G GRIESH (INSTRUMENTS) IMPLANT
SET INJECTOR OIL FLUID CONSTEL (OPHTHALMIC) ×2 IMPLANT
SPONGE SURGIFOAM ABS GEL 12-7 (HEMOSTASIS) IMPLANT
STOPCOCK 4 WAY LG BORE MALE ST (IV SETS) ×2 IMPLANT
SUT CHROMIC 7 0 TG140 8 (SUTURE) ×4 IMPLANT
SUT ETHILON 5.0 S-24 (SUTURE) ×4 IMPLANT
SUT ETHILON 9 0 TG140 8 (SUTURE) ×4 IMPLANT
SUT MERSILENE 4 0 RV 2 (SUTURE) ×4 IMPLANT
SUT SILK 2 0 (SUTURE) ×4
SUT SILK 2 0 TIES 17X18 (SUTURE) ×4
SUT SILK 2-0 18XBRD TIE 12 (SUTURE) ×2 IMPLANT
SUT SILK 2-0 18XBRD TIE BLK (SUTURE) ×2 IMPLANT
SUT SILK 4 0 RB 1 (SUTURE) ×4 IMPLANT
SUT VICRYL 7 0 TG140 8 (SUTURE) ×4 IMPLANT
SYR 10ML LL (SYRINGE) ×4 IMPLANT
SYR 20CC LL (SYRINGE) ×4 IMPLANT
SYR 5ML LL (SYRINGE) ×4 IMPLANT
SYR BULB 3OZ (MISCELLANEOUS) ×4 IMPLANT
SYR TB 1ML LUER SLIP (SYRINGE) ×2 IMPLANT
TOWEL GREEN STERILE FF (TOWEL DISPOSABLE) ×4 IMPLANT
TUBING HIGH PRESS EXTEN 6IN (TUBING) ×4 IMPLANT
WATER STERILE IRR 1000ML POUR (IV SOLUTION) ×4 IMPLANT

## 2019-02-24 NOTE — Progress Notes (Signed)
[facility] Note  02/25/2019     CHIEF COMPLAINT Patient presents for Post-op Follow-up   HISTORY OF PRESENT ILLNESS: Randy Oneal is a 53 y.o. male who presents to the clinic today for:   HPI    Post-op Follow-up    In right eye.  Discomfort includes Negative for none.  Vision is stable.  I, the attending physician,  performed the HPI with the patient and updated documentation appropriately.          Comments    POV#1. Patient states he slept "good" last night in recliner , denies ocular pain. Pt maintained head down position 90 % of the time. Last BS 134 (02/24/19).       Last edited by Bernarda Caffey, MD on 02/25/2019  8:53 AM. (History)     s/p PPV/PFC/EL/FAX/silicone oil OD, 37.10.62, no eye pain today, maintained head down position 90% of the time last night  Referring physician: [name], MD [address],  [place] [address]  HISTORICAL INFORMATION:   Selected notes from the MEDICAL RECORD NUMBER Referred by Dr.Mark Gershon Crane for concern of decreased vision post cataract sx LEE:  Ocular Hx- PMH-   CURRENT MEDICATIONS: No current outpatient medications on file. (Ophthalmic Drugs)   No current facility-administered medications for this visit.  (Ophthalmic Drugs)   Current Outpatient Medications (Other)  Medication Sig  . amLODipine (NORVASC) 10 MG tablet Take one tablet by mouth each day  . atorvastatin (LIPITOR) 10 MG tablet Take 1 tablet (10 mg total) by mouth daily at 6 PM.  . Continuous Blood Gluc Sensor (FREESTYLE LIBRE SENSOR SYSTEM) MISC Use one sensor every 10 days.  . famotidine (PEPCID) 40 MG tablet Take one tablet by mouth daily  . furosemide (LASIX) 40 MG tablet Take 1 tablet (40 mg total) by mouth daily as needed. Take for weight gain of 3-5 lbs (Patient taking differently: Take 40 mg by mouth daily as needed for fluid or edema. Take for weight gain of 3-5 lbs)  . glucose blood (BAYER CONTOUR NEXT  TEST) test strip USE TO TEST FOUR TIMES DAILY.  Marland Kitchen insulin lispro (HUMALOG KWIKPEN) 100 UNIT/ML KwikPen Inject 0.06 mLs (6 Units total) into the skin daily with breakfast AND 0.07 mLs (7 Units total) 2 (two) times daily before lunch and supper. (Patient taking differently: Inject 7 Units into the skin daily with breakfast AND 6 Units total 2 (two) times daily before lunch and supper.)  . Insulin Pen Needle (BD PEN NEEDLE NANO 2ND GEN) 32G X 4 MM MISC Four times daily  . metoprolol tartrate (LOPRESSOR) 50 MG tablet Take one half tablet by mouth twice daily (Patient taking differently: Take 25 mg by mouth 2 (two) times daily. )  . MICROLET LANCETS MISC USE FOUR TIMES A DAY AS DIRECTED.  Marland Kitchen nystatin (MYCOSTATIN/NYSTOP) powder Apply topically 2 (two) times daily. To groin and scrotum  . pantoprazole (PROTONIX) 40 MG tablet Take 1 tablet (40 mg total) by mouth daily.  . potassium chloride SA (K-DUR,KLOR-CON) 20 MEQ tablet Take 1 tablet (20 mEq total) by mouth daily as needed. Only take on days you use Lasix  . sildenafil (REVATIO) 20 MG tablet May take up to 5 before relations as directed (Patient taking differently: Take 20-100 mg by mouth daily as needed (erectile dysfunction). )  . TRESIBA FLEXTOUCH 100 UNIT/ML SOPN FlexTouch Pen Inject 0.19 mLs (19 Units total) into the skin at bedtime. (Patient taking differently:  Inject 22 Units into the skin at bedtime. )   No current facility-administered medications for this visit.  (Other)      REVIEW OF SYSTEMS: ROS    Positive for: Eyes   Negative for: Constitutional, Gastrointestinal, Neurological, Skin, Genitourinary, Musculoskeletal, HENT, Endocrine, Cardiovascular, Respiratory, Psychiatric, Allergic/Imm, Heme/Lymph   Last edited by Zenovia Jordan, LPN on 4/85/4627  0:35 AM. (History)       ALLERGIES Allergies  Allergen Reactions  . Tramadol Nausea And Vomiting    Nausea and vomiting    PAST MEDICAL HISTORY Past Medical History:  Diagnosis  Date  . Diabetes mellitus    Insulin dependent  . Diabetic retinopathy of both eyes ([place]) 01/31/2019   Dr Coralyn Pear 01/2019  . Hypertension   . Renal insufficiency   . Sepsis due to Streptococcus, group B ([place]) 10/05/2018   Past Surgical History:  Procedure Laterality Date  . [place] VITRECTOMY WITH 20 GAUGE MVR PORT FOR MACULAR HOLE Right 02/24/2019   Procedure: 25 GAUGE PARS PLANA VITRECTOMY WITH 20 GAUGE MVR PORT FOR MACULAR HOLE;  Surgeon: Bernarda Caffey, MD;  Location: [facility];  Service: Ophthalmology;  Laterality: Right;  . APPLICATION OF WOUND VAC Left 08/27/2018   Procedure: APPLICATION OF WOUND VAC, left neck;  Surgeon: Gaye Pollack, MD;  Location: [facility];  Service: Thoracic;  Laterality: Left;  . APPLICATION OF WOUND VAC Left 08/30/2018   Procedure: APPLICATION OF WOUND VAC;  Surgeon: Gaye Pollack, MD;  Location: [facility];  Service: Thoracic;  Laterality: Left;  . CATARACT EXTRACTION W/ INTRAOCULAR LENS  IMPLANT, BILATERAL    . COLONOSCOPY  06/24/2011   Procedure: COLONOSCOPY;  Surgeon: [name], MD;  [facility];  Service: Endoscopy;  Laterality: N/A;  8:30 AM  . EYE SURGERY    . INJECTION OF SILICONE OIL  0/04/3817   Procedure: Injection Of Silicone Oil;  Surgeon: Bernarda Caffey, MD;  Location: [facility];  Service: Ophthalmology;;  . MEMBRANE PEEL Right 02/24/2019   Procedure: Antoine Primas;  Surgeon: Bernarda Caffey, MD;  Location: [facility];  Service: Ophthalmology;  Laterality: Right;  . PHOTOCOAGULATION WITH LASER Right 02/24/2019   Procedure: Photocoagulation With Laser;  Surgeon: Bernarda Caffey, MD;  Location: [facility];  Service: Ophthalmology;  Laterality: Right;  . STERNAL WOUND DEBRIDEMENT Left 08/27/2018   Procedure: Incision and DEBRIDEMENT Left Chest, Neck and Mediastinum;  Surgeon: Gaye Pollack, MD;  Location: [facility] OR;  Service: Thoracic;  Laterality: Left;  . STERNAL WOUND DEBRIDEMENT Left 08/30/2018   Procedure: WOUND VAC CHANGE, LEFT CHEST AND NECK,  POSSIBLE DEBRIDEMENT;  Surgeon: Gaye Pollack, MD;  Location: [facility] OR;  Service: Thoracic;  Laterality: Left;    FAMILY HISTORY Family History  Problem Relation Age of Onset  . Hypertension Mother   . Colon cancer Neg Hx     SOCIAL HISTORY Social History   Tobacco Use  . Smoking status: Never Smoker  . Smokeless tobacco: Never Used  Substance Use Topics  . Alcohol use: No  . Drug use: No         OPHTHALMIC EXAM:  Base Eye Exam    Visual Acuity (Snellen - Linear)      Right Left   Dist [place] CF at 3' 20/100   Dist ph [place]  20/80       Tonometry (Tonopen, 9:18 AM)      Right Left   Pressure 18 10       Neuro/Psych  Oriented x3: Yes   Mood/Affect: Normal       Dilation    Right eye: 1.0% Mydriacyl, 2.5% Phenylephrine @ 08:45 am        Slit Lamp and Fundus Exam    Slit Lamp Exam      Right Left   Lids/Lashes mild Meibomian gland dysfunction mild Meibomian gland dysfunction   Conjunctiva/Sclera Nasal and temporal chemosis, sutures intact White and quiet   Cornea Mild endopigment inferior, trace descements folds mild Arcus, 2+ Descemet's folds, Well healed temporal cataract wounds   Anterior Chamber moderate depth Deep, 3-4+pigment   Iris slightly irregular, dilated Round and dilated   Lens PC IOL in good position PC IOL in good position   Vitreous Post vit, good silicone oil fill Vitreous syneresis, +cell/pigment in anterior vitreous, diffuse vitreous Hemorrhage settling inferiorly, Posterior vitreous detachment, vitreous condensations       Fundus Exam      Right Left   Disc pink and sharp, +central heme    C/D Ratio 0.4    Macula Flat under oil, mild residual fibrosis--improved, scattered IRH, hole temp macula with good laser surrounding    Vessels attenuated; tortuous; improved fibrosis -  mild fibrotic remnants    Periphery Attached, good 360 PRP laser            IMAGING AND PROCEDURES  Imaging and Procedures for @TODAY @            ASSESSMENT/PLAN:    ICD-10-CM   1. Proliferative diabetic retinopathy of both eyes with macular edema associated with type 2 diabetes mellitus ([place])  Z61.0960   2. 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 Vitreous hemorrhage of both eyes ([place])  H43.13   4. Retinal edema  H35.81   5. Essential hypertension  I10   6. Hypertensive retinopathy of both eyes  H35.033   7. Pseudophakia of both eyes  Z96.1     1-4. Proliferative diabetic retinopathy with DME, TRD, and vitreous hemorrhage OU (OD > OS)  - pt with complex medical history with hospitalization in Jan-Feb 2020 for bacteremia and abscess  - history of poor glycemic control for years  - s/p IVA OU #1 (06.02.20), #2 (07.01.20)  - s/p PRP OS (06.10.20)  - BCVA OD HM; OS stable at 20/80  - exam shows diffuse vitreous and preretinal hemorrhage + scattered fibrosis OU   - interval clearing of VH has improved visualization of posterior pole OU -- now can clearly see significant macular TRD OD; and ?regressing NVD OS  - OCT shows preretinal fibrosis w/ traction OU (OD > OS)  - now POD1 s/p PPV/PFC/EL/FAX/silicone oil OD, 45.40.98             - doing well this morning             - improved fibrosis, retina attached and in good position under oil             - IOP 18             - start   PF 6x/day OD                         zymaxid QID OD                         Atropine BID OD  Cosopt BID OD                                                 PSO ung QID OD             - cont face down positioning x3 days; avoid laying flat on back             - eye shield when sleeping             - post op drop and positioning instructions reviewed             - tylenol/ibuprofen for pain             - Rx given for breakthrough pain  - f/u 1 week  5,6. Hypertensive retinopathy OU  - discussed importance of tight BP control  - monitor  7. Pseudophakia OU  - s/p CE/IOL OU (Dr. Gershon Crane)  - post op drops  per Dr. Gershon Crane  - recommend adding NSAID QID OU -- samples of prolensa given   Ophthalmic Meds Ordered this visit:  No orders of the defined types were placed in this encounter.      Return 1 week, for DFE, OCT.  There are no Patient Instructions on file for this visit.   Explained the diagnoses, plan, and follow up with the patient and they expressed understanding.  Patient expressed understanding of the importance of proper follow up care.   This document serves as a record of services personally performed by Gardiner Sleeper, MD, PhD. It was created on their behalf by Ernest Mallick, OA, an ophthalmic assistant. The creation of this record is the provider's dictation and/or activities during the visit.    Electronically signed by: Ernest Mallick, OA 07.16.2020 10:43 PM    Gardiner Sleeper, M.D., Ph.D. Diseases & Surgery of the Retina and Vitreous [facility]  I have reviewed the above documentation for accuracy and completeness, and I agree with the above. Gardiner Sleeper, M.D., Ph.D. 02/25/19 10:48 PM    Abbreviations: M myopia (nearsighted); A astigmatism; H hyperopia (farsighted); P presbyopia; Mrx spectacle prescription;  CTL contact lenses; OD right eye; OS left eye; OU both eyes  XT exotropia; ET esotropia; PEK punctate epithelial keratitis; PEE punctate epithelial erosions; DES dry eye syndrome; MGD meibomian gland dysfunction; ATs artificial tears; PFAT's preservative free artificial tears; [place] nuclear sclerotic cataract; PSC posterior subcapsular cataract; ERM epi-retinal membrane; PVD posterior vitreous detachment; RD retinal detachment; DM diabetes mellitus; DR diabetic retinopathy; NPDR non-proliferative diabetic retinopathy; PDR proliferative diabetic retinopathy; CSME clinically significant macular edema; DME diabetic macular edema; dbh dot blot hemorrhages; CWS cotton wool spot; POAG primary open angle glaucoma; C/D cup-to-disc ratio; HVF humphrey  visual field; GVF goldmann visual field; OCT optical coherence tomography; IOP intraocular pressure; BRVO Branch retinal vein occlusion; CRVO central retinal vein occlusion; CRAO central retinal artery occlusion; BRAO branch retinal artery occlusion; RT retinal tear; SB scleral buckle; PPV pars plana vitrectomy; VH Vitreous hemorrhage; PRP panretinal laser photocoagulation; IVK intravitreal kenalog; VMT vitreomacular traction; MH Macular hole;  NVD neovascularization of the disc; NVE neovascularization elsewhere; AREDS age related eye disease study; ARMD age related macular degeneration; POAG primary open angle glaucoma; EBMD epithelial/anterior basement membrane dystrophy; ACIOL anterior chamber intraocular lens; IOL intraocular lens; PCIOL posterior chamber intraocular lens; Phaco/IOL phacoemulsification with  intraocular lens placement; [place] photorefractive keratectomy; LASIK laser assisted in situ keratomileusis; HTN hypertension; DM diabetes mellitus; COPD chronic obstructive pulmonary disease

## 2019-02-24 NOTE — Anesthesia Postprocedure Evaluation (Signed)
Anesthesia Post Note  Patient: Randy Oneal  Procedure(s) Performed: 25 GAUGE PARS PLANA VITRECTOMY WITH 20 GAUGE MVR PORT FOR MACULAR HOLE (Right Eye) Membrane Peel (Right Eye) Photocoagulation With Laser (Right Eye) Injection Of Silicone Oil     Patient location during evaluation: PACU Anesthesia Type: General Level of consciousness: awake and alert Pain management: pain level controlled Vital Signs Assessment: post-procedure vital signs reviewed and stable Respiratory status: spontaneous breathing, nonlabored ventilation and respiratory function stable Cardiovascular status: blood pressure returned to baseline and stable Postop Assessment: no apparent nausea or vomiting Anesthetic complications: no    Last Vitals:  Vitals:   02/24/19 1545 02/24/19 1600  BP: 134/87 125/84  Pulse: 69 69  Resp: 14 15  Temp:    SpO2: 98% 100%    Last Pain:  Vitals:   02/24/19 1545  TempSrc:   PainSc: Asleep                 Tiffanny Lamarche,W. EDMOND

## 2019-02-24 NOTE — Discharge Instructions (Signed)
POSTOPERATIVE INSTRUCTIONS  Your doctor has performed vitreoretinal surgery on you at [facility].  - Keep eye patched and shielded until seen by Dr. Matej Sappenfield 8 AM tomorrow in clinic - Do not use drops until return - FACE DOWN POSITIONING WHILE AWAKE - Sleep with belly down or on left side, avoid laying flat on back.    - No strenuous bending, stooping or lifting.  - You may not drive until further notice.  - If your doctor used a gas bubble in your eye during the procedure he will advise you on postoperative positioning. If you have a gas bubble you will be wearing a green bracelet that was applied in the operating room. The green bracelet should stay on as long as the gas bubble is in your eye. While the gas bubble is present you should not fly in an airplane. If you require general anesthesia while the gas bubble is present you must notify your anesthesiologist that an intraocular gas bubble is present so he can take the appropriate precautions.  - Tylenol or any other over-the-counter pain reliever can be used according to your doctor. If more pain medicine is required, your doctor will have a prescription for you.  - You may read, go up and down stairs, and watch television.     Nakota Ackert, M.D., Ph.D.  

## 2019-02-24 NOTE — Interval H&P Note (Signed)
History and Physical Interval Note:  02/24/2019 10:49 AM  Randy Oneal  has presented today for surgery, with the diagnosis of tractional retinal detachment right eye.  The various methods of treatment have been discussed with the patient and family. After consideration of risks, benefits and other options for treatment, the patient has consented to  Procedure(s): 25 GAUGE PARS PLANA VITRECTOMY WITH 20 GAUGE MVR PORT FOR MACULAR HOLE (Right) as a surgical intervention.  The patient's history has been reviewed, patient examined, no change in status, stable for surgery.  I have reviewed the patient's chart and labs.  Questions were answered to the patient's satisfaction.     Bernarda Caffey

## 2019-02-24 NOTE — Transfer of Care (Signed)
Immediate Anesthesia Transfer of Care Note  Patient: [name] III  Procedure(s) Performed: 25 GAUGE PARS PLANA VITRECTOMY WITH 20 GAUGE MVR PORT FOR MACULAR HOLE (Right Eye) Membrane Peel (Right Eye) Photocoagulation With Laser (Right Eye) Injection Of Silicone Oil  Patient Location: PACU  Anesthesia Type:General  Level of Consciousness: drowsy  Airway & Oxygen Therapy: Patient Spontanous Breathing and Patient connected to nasal cannula oxygen  Post-op Assessment: Report given to RN and Post -op Vital signs reviewed and stable  Post vital signs: Reviewed and stable  Last Vitals:  Vitals Value Taken Time  BP 165/94 02/24/19 1516  Temp    Pulse 68 02/24/19 1520  Resp 14 02/24/19 1520  SpO2 100 % 02/24/19 1520  Vitals shown include unvalidated device data.  Last Pain:  Vitals:   02/24/19 1515  TempSrc:   PainSc: (P) Asleep      Patients Stated Pain Goal: 0 (94/49/67 [id number])  Complications: No apparent anesthesia complications

## 2019-02-24 NOTE — Brief Op Note (Signed)
02/24/2019  3:17 PM  PATIENT:  Randy Oneal  53 y.o. male  PRE-OPERATIVE DIAGNOSIS:  tractional retinal detachment right eye  POST-OPERATIVE DIAGNOSIS:  tractional retinal detachment right eye  PROCEDURE:  Procedure(s): 25 GAUGE PARS PLANA VITRECTOMY WITH 20 GAUGE MVR PORT FOR MACULAR HOLE (Right) Membrane Peel (Right) Photocoagulation With Laser (Right) Injection Of Silicone Oil  SURGEON:  Surgeon(s) and Role:    Bernarda Caffey, MD - Primary  ASSISTANTS: Caswell Corwin, CST   ANESTHESIA:   general  EBL:  0 mL   BLOOD ADMINISTERED:none  DRAINS: none   LOCAL MEDICATIONS USED:  NONE  SPECIMEN:  No Specimen  DISPOSITION OF SPECIMEN:  N/A  COUNTS:  YES  TOURNIQUET:  * No tourniquets in log *  DICTATION: .Note written in EPIC  PLAN OF CARE: Discharge to home after PACU  PATIENT DISPOSITION:  PACU - hemodynamically stable.   Delay start of Pharmacological VTE agent (>24hrs) due to surgical blood loss or risk of bleeding: not applicable

## 2019-02-24 NOTE — Op Note (Signed)
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 25 gauge pars plana vitrectomy, Right Eye 2. Preretinal membrane peel, Right Eye 3. Endolaser, Right Eye 4. Injection of 0923RA silicon oil, Right Eye 5. 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 The patient presented with decreased vision and progressing tractional fibrosis, right eye. After discussing the risks, benefits, and alternatives to surgery, the patient electively decided to proceed with surgery and informed consent was obtained. The surgery was an attempt to remove all preretinal/tractional membranes from the right eye, repair the retinal detachment, and potentially improve the vision within the reasonable expectations of the surgeon.    Procedure in Detail:    The patient was met in the pre-operative holding area where their identification data was verified.  It was noted that there was a signed, informed consent in the chart and the right eye wase verbally verified by the patient for surgery. Then the operative eye and was marked with the surgeon's initials with a marking pen. The patient was then taken to the operating room and placed in the supine position. General endotracheal anesthesia was induced.   The RIGHT EYE was then prepped with 5% betadine and draped in the normal fashion for ophthalmic surgery. The microscope was draped and swung into position, and a secondary time-out was performed to identify the correct patient, eyes, procedures, and any allergies.   A 25 gauge trocar was inserted in a  30-45 degrees fashion into the inferotemporal quadrant 3.5 mm posterior to the limbus in this pseudophakic patient. Correct positioning within the vitreous was verified externally with the light pipe. The infusion was then connected to the cannula and BSS infusion was commenced.  Additional ports were placed in the superonasal and superotemporal quadrants. Viscoat was placed on the cornea. The BIOM was used to visualize the posterior segment. The patient had significant preretinal tractional fibrosis emanating from the optic disc and along the temporal arcades and macula. In addition, there were 2 stretch holes with subretinal fluid, upgrading this detachment to a combined rhegmatogenous and tractional retinal detachment.  1 stretch hole was temporal to the fovea and the other was superior to the optic disc. A core vitrectomy was performed using the BIOM visualization system, vitrectomy probe and light pipe. Kenalog was used to mark the vitreous and assist in dissecting vitreous membranes.      A macular contact lens was placed on the eye. End-grasping ILM and MaxGrip forceps and the vitrectomy probe were used to carefully peel and dissect preretinal membranes off the entire surface of the retina as was deemed safe.     The wide angle viewing system was brought back into position to assist in peeling peripheral membranes. No peripheral retinal tears were noted. At this time, an fluid-air exchange was then performed to remove the intravitreal BSS and dry the subretinal fluid through the 2 stretch holes. Under air, panretinal photocoagulation was performed via endolaser 360 and posteriorly almost to the arcades. Then, 0762 cs silicon oil was injected via the superior temporal trocar and filled up to the level of the lens plane  with the aid of the venting cannula.   The superonasal port was then removed and sutured with 7-0 vicryl, there was no leakage. Next, 0.05 cc of bevacizumab was injected into the vitreous  cavity via the superotemporal trocar. The superotemporal and infusion ports were removed and were sutured with 7-0 vicryl.  There was no leakage from the sclerotomy sites.   Subconjunctival injections of kefzol + bacitracin + polymixin b and kenalog were then administered, and antibiotic and steroid drops as well as antibiotic ointment were placed in the eye.  The drapes were removed and the eye was patched and shielded.  The patient was then taken to the post-operative area for recovery having tolerated the procedure well.  She was instructed to perform face down positioning postoperatively and to follow up in clinic the following morning as scheduled.   Estimate blood lost: none Complications: None

## 2019-02-24 NOTE — Anesthesia Procedure Notes (Signed)
Procedure Name: Intubation Date/Time: 02/24/2019 11:55 AM Performed by: Gwyndolyn Saxon, CRNA Pre-anesthesia Checklist: Patient identified, Emergency Drugs available, Suction available and Patient being monitored Patient Re-evaluated:Patient Re-evaluated prior to induction Oxygen Delivery Method: Circle system utilized Preoxygenation: Pre-oxygenation with 100% oxygen Induction Type: IV induction and Rapid sequence Laryngoscope Size: Sabra Heck and 2 Grade View: Grade I Tube type: Oral Rae Tube size: 7.5 mm Number of attempts: 1 Placement Confirmation: ETT inserted through vocal cords under direct vision,  positive ETCO2 and breath sounds checked- equal and bilateral Tube secured with: Tape Dental Injury: Teeth and Oropharynx as per pre-operative assessment

## 2019-02-25 ENCOUNTER — Encounter (INDEPENDENT_AMBULATORY_CARE_PROVIDER_SITE_OTHER): Payer: Self-pay | Admitting: Ophthalmology

## 2019-02-25 ENCOUNTER — Ambulatory Visit (INDEPENDENT_AMBULATORY_CARE_PROVIDER_SITE_OTHER): Payer: Managed Care, Other (non HMO) | Admitting: Ophthalmology

## 2019-02-25 DIAGNOSIS — H35033 Hypertensive retinopathy, bilateral: Secondary | ICD-10-CM

## 2019-02-25 DIAGNOSIS — Z961 Presence of intraocular lens: Secondary | ICD-10-CM

## 2019-02-25 DIAGNOSIS — H4313 Vitreous hemorrhage, bilateral: Secondary | ICD-10-CM

## 2019-02-25 DIAGNOSIS — H3581 Retinal edema: Secondary | ICD-10-CM

## 2019-02-25 DIAGNOSIS — E113513 Type 2 diabetes mellitus with proliferative diabetic retinopathy with macular edema, bilateral: Secondary | ICD-10-CM

## 2019-02-25 DIAGNOSIS — I10 Essential (primary) hypertension: Secondary | ICD-10-CM

## 2019-02-25 DIAGNOSIS — H3343 Traction detachment of retina, bilateral: Secondary | ICD-10-CM

## 2019-02-28 ENCOUNTER — Ambulatory Visit (HOSPITAL_COMMUNITY): Payer: Managed Care, Other (non HMO) | Admitting: Physical Therapy

## 2019-03-01 ENCOUNTER — Encounter (HOSPITAL_COMMUNITY): Payer: Managed Care, Other (non HMO) | Admitting: Physical Therapy

## 2019-03-03 ENCOUNTER — Ambulatory Visit (HOSPITAL_COMMUNITY): Payer: Managed Care, Other (non HMO)

## 2019-03-03 NOTE — Progress Notes (Signed)
[facility] Note  03/07/2019     CHIEF COMPLAINT Patient presents for Post-op Follow-up   HISTORY OF PRESENT ILLNESS: Randy Oneal is a 53 y.o. male who presents to the clinic today for:   HPI    Post-op Follow-up    In right eye.  Vision is improved.  I, the attending physician,  performed the HPI with the patient and updated documentation appropriately.          Comments    Patient states he feels like his vision is improving OD but slowly improving.  Patient denies eye pain or discomfort and denies any new or worsening floaters or fol OU.       Last edited by Bernarda Caffey, MD on 03/07/2019 11:27 AM. (History)    Pt feels like there is a bit of a "shadow" over his VA OD.  Pt had difficulty sleeping for the first three days after sx.  Pt only on his back when putting in eye gtts.  Pt occassionally finds himself laying on his side.  Referring physician: [name], MD [address],  [place] [id number]  HISTORICAL INFORMATION:   Selected notes from the MEDICAL RECORD NUMBER Referred by Dr.Mark Gershon Crane for concern of decreased vision post cataract sx LEE:  Ocular Hx- PMH-   CURRENT MEDICATIONS: Current Outpatient Medications (Ophthalmic Drugs)  Medication Sig  . prednisoLONE acetate (PRED FORTE) 1 % ophthalmic suspension Place 1 drop into the right eye 4 (four) times daily.   No current facility-administered medications for this visit.  (Ophthalmic Drugs)   Current Outpatient Medications (Other)  Medication Sig  . amLODipine (NORVASC) 10 MG tablet Take one tablet by mouth each day  . atorvastatin (LIPITOR) 10 MG tablet Take 1 tablet (10 mg total) by mouth daily at 6 PM.  . Continuous Blood Gluc Sensor (FREESTYLE LIBRE SENSOR SYSTEM) MISC Use one sensor every 10 days.  . famotidine (PEPCID) 40 MG tablet Take one tablet by mouth daily  . furosemide (LASIX) 40 MG tablet Take 1 tablet (40 mg total) by mouth daily as  needed. Take for weight gain of 3-5 lbs (Patient taking differently: Take 40 mg by mouth daily as needed for fluid or edema. Take for weight gain of 3-5 lbs)  . glucose blood (BAYER CONTOUR NEXT TEST) test strip USE TO TEST FOUR TIMES DAILY.  Marland Kitchen insulin lispro (HUMALOG KWIKPEN) 100 UNIT/ML KwikPen Inject 0.06 mLs (6 Units total) into the skin daily with breakfast AND 0.07 mLs (7 Units total) 2 (two) times daily before lunch and supper. (Patient taking differently: Inject 7 Units into the skin daily with breakfast AND 6 Units total 2 (two) times daily before lunch and supper.)  . Insulin Pen Needle (BD PEN NEEDLE NANO 2ND GEN) 32G X 4 MM MISC Four times daily  . metoprolol tartrate (LOPRESSOR) 50 MG tablet Take one half tablet by mouth twice daily (Patient taking differently: Take 25 mg by mouth 2 (two) times daily. )  . MICROLET LANCETS MISC USE FOUR TIMES A DAY AS DIRECTED.  Marland Kitchen nystatin (MYCOSTATIN/NYSTOP) powder Apply topically 2 (two) times daily. To groin and scrotum  . pantoprazole (PROTONIX) 40 MG tablet Take 1 tablet (40 mg total) by mouth daily.  . potassium chloride SA (K-DUR,KLOR-CON) 20 MEQ tablet Take 1 tablet (20 mEq total) by mouth daily as needed. Only take on days you use Lasix  . sildenafil (REVATIO) 20 MG tablet May take up to  5 before relations as directed (Patient taking differently: Take 20-100 mg by mouth daily as needed (erectile dysfunction). )  . TRESIBA FLEXTOUCH 100 UNIT/ML SOPN FlexTouch Pen Inject 0.19 mLs (19 Units total) into the skin at bedtime. (Patient taking differently: Inject 22 Units into the skin at bedtime. )   No current facility-administered medications for this visit.  (Other)      REVIEW OF SYSTEMS: ROS    Positive for: Eyes   Negative for: Constitutional, Gastrointestinal, Neurological, Skin, Genitourinary, Musculoskeletal, HENT, Endocrine, Cardiovascular, Respiratory, Psychiatric, Allergic/Imm, Heme/Lymph   Last edited by Doneen Poisson on 03/07/2019  10:15 AM. (History)       ALLERGIES Allergies  Allergen Reactions  . Tramadol Nausea And Vomiting    Nausea and vomiting    PAST MEDICAL HISTORY Past Medical History:  Diagnosis Date  . Diabetes mellitus    Insulin dependent  . Diabetic retinopathy of both eyes ([place]) 01/31/2019   Dr Coralyn Pear 01/2019  . Hypertension   . Renal insufficiency   . Sepsis due to Streptococcus, group B ([place]) 10/05/2018   Past Surgical History:  Procedure Laterality Date  . Crum VITRECTOMY WITH 20 GAUGE MVR PORT FOR MACULAR HOLE Right 02/24/2019   Procedure: 25 GAUGE PARS PLANA VITRECTOMY WITH 20 GAUGE MVR PORT FOR MACULAR HOLE;  Surgeon: Bernarda Caffey, MD;  Location: [facility];  Service: Ophthalmology;  Laterality: Right;  . APPLICATION OF WOUND VAC Left 08/27/2018   Procedure: APPLICATION OF WOUND VAC, left neck;  Surgeon: Gaye Pollack, MD;  Location: [facility];  Service: Thoracic;  Laterality: Left;  . APPLICATION OF WOUND VAC Left 08/30/2018   Procedure: APPLICATION OF WOUND VAC;  Surgeon: Gaye Pollack, MD;  Location: [facility];  Service: Thoracic;  Laterality: Left;  . CATARACT EXTRACTION W/ INTRAOCULAR LENS  IMPLANT, BILATERAL    . COLONOSCOPY  06/24/2011   Procedure: COLONOSCOPY;  Surgeon: [name], MD;  [facility];  Service: Endoscopy;  Laterality: N/A;  8:30 AM  . EYE SURGERY    . INJECTION OF SILICONE OIL  5/63/8756   Procedure: Injection Of Silicone Oil;  Surgeon: Bernarda Caffey, MD;  Location: [facility];  Service: Ophthalmology;;  . MEMBRANE PEEL Right 02/24/2019   Procedure: Antoine Primas;  Surgeon: Bernarda Caffey, MD;  Location: [facility];  Service: Ophthalmology;  Laterality: Right;  . PHOTOCOAGULATION WITH LASER Right 02/24/2019   Procedure: Photocoagulation With Laser;  Surgeon: Bernarda Caffey, MD;  Location: [facility];  Service: Ophthalmology;  Laterality: Right;  . STERNAL WOUND DEBRIDEMENT Left 08/27/2018   Procedure: Incision and DEBRIDEMENT Left Chest, Neck and Mediastinum;   Surgeon: Gaye Pollack, MD;  Location: [facility];  Service: Thoracic;  Laterality: Left;  . STERNAL WOUND DEBRIDEMENT Left 08/30/2018   Procedure: WOUND VAC CHANGE, LEFT CHEST AND NECK, POSSIBLE DEBRIDEMENT;  Surgeon: Gaye Pollack, MD;  Location: [facility];  Service: Thoracic;  Laterality: Left;    FAMILY HISTORY Family History  Problem Relation Age of Onset  . Hypertension Mother   . Colon cancer Neg Hx     SOCIAL HISTORY Social History   Tobacco Use  . Smoking status: Never Smoker  . Smokeless tobacco: Never Used  Substance Use Topics  . Alcohol use: No  . Drug use: No         OPHTHALMIC EXAM:  Base Eye Exam    Visual Acuity (Snellen - Linear)      Right Left   Dist [place] CF @  face 20/100 -2   Dist ph [place] NI NI       Tonometry (Tonopen, 10:18 AM)      Right Left   Pressure 12 13       Pupils      Dark Light Shape React APD   Right 6 6 Round non reactive 0   Left 3 2 Round Minimal 0       Visual Fields      Left Right    Full    Restrictions  Total superior temporal, inferior temporal, superior nasal, inferior nasal deficiencies       Extraocular Movement      Right Left    Full Full       Neuro/Psych    Oriented x3: Yes   Mood/Affect: Normal       Dilation    Both eyes: 1.0% Mydriacyl, 2.5% Phenylephrine @ 10:18 AM        Slit Lamp and Fundus Exam    Slit Lamp Exam      Right Left   Lids/Lashes mild Meibomian gland dysfunction mild Meibomian gland dysfunction   Conjunctiva/Sclera Nasal and temporal chemosis with subconj silicon oil, sutures intact White and quiet   Cornea 2+ PEE mild Arcus, 2+ Descemet's folds, Well healed temporal cataract wounds   Anterior Chamber Deep, 1/2+ pigment Deep, 3-4+pigment   Iris slightly irregular, dilated, posterior synchiae @ 2:00, 3:00, 7:30 and 10:30 Round and dilated   Lens PC IOL in good position, 1+ PCO PC IOL in good position   Vitreous Post vit, good silicone oil fill.  Oil bubble 85-90%. Vitreous syneresis,  +cell/pigment in anterior vitreous, diffuse vitreous Hemorrhage settling inferiorly, Posterior vitreous detachment, vitreous condensations       Fundus Exam      Right Left   Disc pink and sharp, +central heme +fibrosis, regressing NVD?   C/D Ratio 0.4    Macula Flat under oil, mild residual fibrosis--improved, scattered IRH improving, hole temp macula with good laser surrounding Blunted foveal reflex, tractional fibrosis along IT arcades, Focal TRD with retinal tear along distal ST arcade   Vessels attenuated; tortuous; improved fibrosis -  mild fibrotic remnants attenuated; fibrosis along IT arcades   Periphery Attached, good 360 PRP laser  red reflex; grossly attached, old VH settling inferiorly, light PRP laser changes--room for fill in, scattered fibrosis          IMAGING AND PROCEDURES  Imaging and Procedures for @TODAY @  OCT, Retina - OU - Both Eyes       Right Eye Quality was poor. Progression has been stable. Findings include preretinal fibrosis, abnormal foveal contour, subretinal fluid, outer retinal atrophy, no IRF (Macula reattached under oil,  mild residual SRF inferiorly).   Left Eye Quality was poor. Central Foveal Thickness: 436. Progression has been stable. Findings include vitreous traction, preretinal fibrosis, intraretinal fluid, macular pucker, abnormal foveal contour, intraretinal hyper-reflective material, epiretinal membrane, subretinal fluid (Mild interval increase in IRF).   Notes *Images captured and stored on drive  Diagnosis / Impression:  OD: Poor image; +TRD of posterior pole OS: Persistent vitreous opacities; Focal TRD along distal ST arcade, mild interval increase in IRF-- stable from prior   Clinical management:  See below  Abbreviations: NFP - Normal foveal profile. CME - cystoid macular edema. PED - pigment epithelial detachment. IRF - intraretinal fluid. SRF - subretinal fluid. EZ - ellipsoid zone. ERM - epiretinal membrane. ORA - outer  retinal atrophy. ORT - outer retinal tubulation. SRHM -  subretinal hyper-reflective material                 ASSESSMENT/PLAN:    ICD-10-CM   1. Proliferative diabetic retinopathy of both eyes with macular edema associated with type 2 diabetes mellitus ([place])  L97.6734   2. 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 Vitreous hemorrhage of both eyes ([place])  H43.13   4. Retinal edema  H35.81 OCT, Retina - OU - Both Eyes  5. Essential hypertension  I10   6. Hypertensive retinopathy of both eyes  H35.033   7. Pseudophakia of both eyes  Z96.1     1-4. Proliferative diabetic retinopathy with DME, TRD, and vitreous hemorrhage OU (OD > OS)  - pt with complex medical history with hospitalization in Jan-Feb 2020 for bacteremia and abscess  - history of poor glycemic control for years  - s/p IVA OU #1 (06.02.20), #2 (07.01.20)  - s/p PRP OS (06.10.20)  - BCVA OD CF; OS 20/100  - exam shows diffuse vitreous and preretinal hemorrhage + scattered fibrosis OU   - pre-op clearing of VH OD has improved visualization of posterior pole --+macular TRD OD; and ?regressing NVD OS  - pre-op OCT shows preretinal fibrosis w/ traction OU (OD > OS)  - now POW1 s/p PPV/PFC/EL/FAX/silicone oil OD, 19.37.90             - doing well   - silicon oil prolapse subconj -- will likely need cleaned out             - improved fibrosis, retina attached and in good position under oil, mild residual SRF inferiorly             - IOP 12             - dec PF to 4x/day OD                         Cosopt QD OD                          - cont zymaxid QID OD -- stop when bottle runs out                         Atropine BID OD-- stop when bottle runs out                         PSO ung QHS and prn OD             - cont face down positioning 50% of time x1 wk; avoid laying flat on back             - eye shield when sleepingx1 more week             - post op drop and positioning instructions reviewed              - tylenol/ibuprofen for pain             - Rx given for breakthrough pain  - f/u 2 weeks -- for DFE/OCT/IVA OS             - Consider sx OS once OD more stable/healed  5,6. Hypertensive retinopathy OU  - discussed importance of tight BP control  - monitor  7. Pseudophakia OU  - s/p CE/IOL OU (Dr. Gershon Crane)  - post op drops  per Dr. Gershon Crane  - recommend adding NSAID QID OU -- samples of prolensa given   Ophthalmic Meds Ordered this visit:  Meds ordered this encounter  Medications  . prednisoLONE acetate (PRED FORTE) 1 % ophthalmic suspension    Sig: Place 1 drop into the right eye 4 (four) times daily.    Dispense:  15 mL    Refill:  0       Return in about 2 weeks (around 03/21/2019) for DFE, OCT and inj. OS.  There are no Patient Instructions on file for this visit.   Explained the diagnoses, plan, and follow up with the patient and they expressed understanding.  Patient expressed understanding of the importance of proper follow up care.   This document serves as a record of services personally performed by Gardiner Sleeper, MD, PhD. It was created on their behalf by Ernest Mallick, OA, an ophthalmic assistant. The creation of this record is the provider's dictation and/or activities during the visit.    Electronically signed by: Ernest Mallick, OA  07.23.2020 11:55 PM    Gardiner Sleeper, M.D., Ph.D. Diseases & Surgery of the Retina and Vitreous [facility]  I have reviewed the above documentation for accuracy and completeness, and I agree with the above. Gardiner Sleeper, M.D., Ph.D. 03/07/19 11:55 PM   Abbreviations: M myopia (nearsighted); A astigmatism; H hyperopia (farsighted); P presbyopia; Mrx spectacle prescription;  CTL contact lenses; OD right eye; OS left eye; OU both eyes  XT exotropia; ET esotropia; PEK punctate epithelial keratitis; PEE punctate epithelial erosions; DES dry eye syndrome; MGD meibomian gland dysfunction; ATs artificial  tears; PFAT's preservative free artificial tears; [place] nuclear sclerotic cataract; PSC posterior subcapsular cataract; ERM epi-retinal membrane; PVD posterior vitreous detachment; RD retinal detachment; DM diabetes mellitus; DR diabetic retinopathy; NPDR non-proliferative diabetic retinopathy; PDR proliferative diabetic retinopathy; CSME clinically significant macular edema; DME diabetic macular edema; dbh dot blot hemorrhages; CWS cotton wool spot; POAG primary open angle glaucoma; C/D cup-to-disc ratio; HVF humphrey visual field; GVF goldmann visual field; OCT optical coherence tomography; IOP intraocular pressure; BRVO Branch retinal vein occlusion; CRVO central retinal vein occlusion; CRAO central retinal artery occlusion; BRAO branch retinal artery occlusion; RT retinal tear; SB scleral buckle; PPV pars plana vitrectomy; VH Vitreous hemorrhage; PRP panretinal laser photocoagulation; IVK intravitreal kenalog; VMT vitreomacular traction; MH Macular hole;  NVD neovascularization of the disc; NVE neovascularization elsewhere; AREDS age related eye disease study; ARMD age related macular degeneration; POAG primary open angle glaucoma; EBMD epithelial/anterior basement membrane dystrophy; ACIOL anterior chamber intraocular lens; IOL intraocular lens; PCIOL posterior chamber intraocular lens; Phaco/IOL phacoemulsification with intraocular lens placement; [place] photorefractive keratectomy; LASIK laser assisted in situ keratomileusis; HTN hypertension; DM diabetes mellitus; COPD chronic obstructive pulmonary disease

## 2019-03-07 ENCOUNTER — Ambulatory Visit (HOSPITAL_COMMUNITY): Payer: Managed Care, Other (non HMO) | Admitting: Physical Therapy

## 2019-03-07 ENCOUNTER — Encounter (INDEPENDENT_AMBULATORY_CARE_PROVIDER_SITE_OTHER): Payer: Self-pay | Admitting: Ophthalmology

## 2019-03-07 ENCOUNTER — Other Ambulatory Visit: Payer: Self-pay

## 2019-03-07 ENCOUNTER — Telehealth (HOSPITAL_COMMUNITY): Payer: Self-pay | Admitting: Physical Therapy

## 2019-03-07 ENCOUNTER — Ambulatory Visit (INDEPENDENT_AMBULATORY_CARE_PROVIDER_SITE_OTHER): Payer: Managed Care, Other (non HMO) | Admitting: Ophthalmology

## 2019-03-07 DIAGNOSIS — H4313 Vitreous hemorrhage, bilateral: Secondary | ICD-10-CM

## 2019-03-07 DIAGNOSIS — H3581 Retinal edema: Secondary | ICD-10-CM | POA: Diagnosis not present

## 2019-03-07 DIAGNOSIS — H3343 Traction detachment of retina, bilateral: Secondary | ICD-10-CM

## 2019-03-07 DIAGNOSIS — I10 Essential (primary) hypertension: Secondary | ICD-10-CM

## 2019-03-07 DIAGNOSIS — Z961 Presence of intraocular lens: Secondary | ICD-10-CM

## 2019-03-07 DIAGNOSIS — H35033 Hypertensive retinopathy, bilateral: Secondary | ICD-10-CM

## 2019-03-07 DIAGNOSIS — E113513 Type 2 diabetes mellitus with proliferative diabetic retinopathy with macular edema, bilateral: Secondary | ICD-10-CM

## 2019-03-07 MED ORDER — PREDNISOLONE ACETATE 1 % OP SUSP: 1.0000 [drp] | Freq: Four times a day (QID) | OPHTHALMIC | 0 refills | Status: DC

## 2019-03-07 NOTE — Telephone Encounter (Signed)
He had a eye doctor apptment and got his eyes dialated he will see [place] Friday

## 2019-03-09 ENCOUNTER — Telehealth: Payer: Self-pay | Admitting: Family Medicine

## 2019-03-09 ENCOUNTER — Encounter (HOSPITAL_COMMUNITY): Payer: Managed Care, Other (non HMO) | Admitting: Physical Therapy

## 2019-03-09 NOTE — Telephone Encounter (Signed)
Erroneous

## 2019-03-09 NOTE — Telephone Encounter (Signed)
Called [facility] to make urgent referral and was told I needed to send over referral form and mark it urgent. Lady from [facility] will fax me the form to fill out. Await form

## 2019-03-09 NOTE — Telephone Encounter (Signed)
I spoke with wife Patient having significant kidney dysfunction Recommend referral to [facility] Stage IV kidney disease with diabetes Urgent referral thank you

## 2019-03-09 NOTE — Telephone Encounter (Signed)
Referral form done and all notes requested sent over to [facility]. Doctor there will review notes and then schedule appt

## 2019-03-10 ENCOUNTER — Other Ambulatory Visit: Payer: Self-pay | Admitting: *Deleted

## 2019-03-10 DIAGNOSIS — E1122 Type 2 diabetes mellitus with diabetic chronic kidney disease: Secondary | ICD-10-CM

## 2019-03-10 DIAGNOSIS — N184 Chronic kidney disease, stage 4 (severe): Secondary | ICD-10-CM

## 2019-03-10 NOTE — Telephone Encounter (Signed)
I called back after faxing forms 3 times to see if they received and got voicemail. I left a message to call me back if she did not get the form and notes.

## 2019-03-10 NOTE — Telephone Encounter (Signed)
Called and spoke with [employer] to see if they received the forms. Our fax machine sent a confirmation that it went through but she states she did not get it so I refaxed the forms and notes requested again.

## 2019-03-11 ENCOUNTER — Ambulatory Visit (HOSPITAL_COMMUNITY): Payer: Managed Care, Other (non HMO) | Admitting: Physical Therapy

## 2019-03-11 ENCOUNTER — Other Ambulatory Visit: Payer: Self-pay

## 2019-03-11 ENCOUNTER — Encounter (HOSPITAL_COMMUNITY): Payer: Self-pay | Admitting: Physical Therapy

## 2019-03-11 DIAGNOSIS — M6281 Muscle weakness (generalized): Secondary | ICD-10-CM

## 2019-03-11 DIAGNOSIS — R262 Difficulty in walking, not elsewhere classified: Secondary | ICD-10-CM

## 2019-03-11 NOTE — Therapy (Signed)
[place], [place], [address] Phone: [phone]   Fax:  [phone]  Physical Therapy Treatment  Patient Details  Name: Randy Oneal MRN: [id number] Date of Birth: 06-29-1966 Referring Provider (PT): Sallee Lange    Encounter Date: 03/11/2019  PT End of Session - 03/11/19 1708    Visit Number  11    Number of Visits  20   insurance with 20 visit limit   Date for PT Re-Evaluation  04/21/19   mini-reassess 8/21   Authorization Type  [employer] Managed (20 visit limit per calander year, 0 used at eval, no auth required)    Authorization Time Period  1/32/4401-0/27/25; new cert 3/66/44 to 0/34/74    Authorization - Visit Number  11    Authorization - Number of Visits  20    PT Start Time  2595    PT Stop Time  1700    PT Time Calculation (min)  39 min    Activity Tolerance  Patient tolerated treatment well    Behavior During Therapy  [facility] for tasks assessed/performed       Past Medical History:  Diagnosis Date  . Diabetes mellitus    Insulin dependent  . Diabetic retinopathy of both eyes ([place]) 01/31/2019   Dr Coralyn Pear 01/2019  . Hypertension   . Renal insufficiency   . Sepsis due to Streptococcus, group B ([place]) 10/05/2018    Past Surgical History:  Procedure Laterality Date  . [place] VITRECTOMY WITH 20 GAUGE MVR PORT FOR MACULAR HOLE Right 02/24/2019   Procedure: 25 GAUGE PARS [place] VITRECTOMY WITH 20 GAUGE MVR PORT FOR MACULAR HOLE;  Surgeon: Bernarda Caffey, MD;  Location: [place];  Service: Ophthalmology;  Laterality: Right;  . APPLICATION OF WOUND VAC Left 08/27/2018   Procedure: APPLICATION OF WOUND VAC, left neck;  Surgeon: Gaye Pollack, MD;  Location: [place];  Service: Thoracic;  Laterality: Left;  . APPLICATION OF WOUND VAC Left 08/30/2018   Procedure: APPLICATION OF WOUND VAC;  Surgeon: Gaye Pollack, MD;  Location: [place];  Service: Thoracic;  Laterality: Left;  . CATARACT EXTRACTION W/  INTRAOCULAR LENS  IMPLANT, BILATERAL    . COLONOSCOPY  06/24/2011   Procedure: COLONOSCOPY;  Surgeon: [name], MD;  [facility];  Service: Endoscopy;  Laterality: N/A;  8:30 AM  . EYE SURGERY    . INJECTION OF SILICONE OIL  6/38/7564   Procedure: Injection Of Silicone Oil;  Surgeon: Bernarda Caffey, MD;  Location: [place];  Service: Ophthalmology;;  . MEMBRANE PEEL Right 02/24/2019   Procedure: Antoine Primas;  Surgeon: Bernarda Caffey, MD;  Location: [place];  Service: Ophthalmology;  Laterality: Right;  . PHOTOCOAGULATION WITH LASER Right 02/24/2019   Procedure: Photocoagulation With Laser;  Surgeon: Bernarda Caffey, MD;  Location: [place];  Service: Ophthalmology;  Laterality: Right;  . STERNAL WOUND DEBRIDEMENT Left 08/27/2018   Procedure: Incision and DEBRIDEMENT Left Chest, Neck and Mediastinum;  Surgeon: Gaye Pollack, MD;  Location: [facility];  Service: Thoracic;  Laterality: Left;  . STERNAL WOUND DEBRIDEMENT Left 08/30/2018   Procedure: WOUND VAC CHANGE, LEFT CHEST AND NECK, POSSIBLE DEBRIDEMENT;  Surgeon: Gaye Pollack, MD;  Location: [place];  Service: Thoracic;  Laterality: Left;    There were no vitals filed for this visit.  Subjective Assessment - 03/11/19 1623    Subjective  I'm doing well today. Have been out of PT as I had corrective  surgery on my right eye. Feeling OK, had to change sleeping habits to help healing with my eye. They don't want me to do a lot of heavy exercises but I can do lighter ones, however MD did say PT is fine with no limitations except no heavy lifting.    Pertinent History  Mr. Lux presented to [facility] on 08/17/18 with signs of sepsis. He was transferred to [facility] where he underwent surgery on 08/27/18 for Lt sternoclavicular infection with abscess to Lt neck and mediastinum. The wound was debrided and wound vac changed again on 08/30/18. Pt was discharged on 09/05/18 with home care to manage wound vac and IV line. On 01/19/19 patient had his final follow up with  his surgeon, Gaye Pollack, MD, and was discharged as wound was fully healed and no signs of infection were present any longer. He is to follow up with his PCP no, Sallee Lange, MD.    Currently in Pain?  No/denies         [facility] PT Assessment - 03/11/19 0001      Assessment   Medical Diagnosis  Leg Pain (weakness)    Referring Provider (PT)  Nicki Reaper [place]     Onset Date/Surgical Date  09/29/18    Hand Dominance  Right    Prior Therapy  late August/early September       Precautions   Precautions  Fall      Balance Screen   Has the patient fallen in the past 6 months  Yes    How many times?  2    Has the patient had a decrease in activity level because of a fear of falling?   Yes    Is the patient reluctant to leave their home because of a fear of falling?   Yes      [place] residence      Prior Function   Level of Independence  Independent    Vocation  Full time employment   prior to hospitalization in January    Leisure  [place]ing [place], anything with trains       Cognition   Overall Cognitive Status  Within Functional Limits for tasks assessed      Single Leg Stance   Comments  R 3 seconds, L 2 seconds       Strength   Right Hip Flexion  4+/5    Right Hip Extension  3/5    Right Hip ABduction  3/5    Left Hip Flexion  4+/5    Left Hip Extension  3/5    Left Hip ABduction  3/5    Right Knee Flexion  3/5    Right Knee Extension  5/5    Left Knee Flexion  3/5    Left Knee Extension  5/5    Right Ankle Dorsiflexion  5/5    Left Ankle Dorsiflexion  5/5                   [facility] Adult PT Treatment/Exercise - 03/11/19 0001      Ambulation/Gait   Ambulation/Gait Assistance  4: Min guard    Ambulation Distance (Feet)  246 Feet    Assistive device  None    Gait Pattern  Step-through pattern;Decreased step length - left;Decreased stance time - right;Decreased stance time - left;Decreased stride length;Decreased  dorsiflexion - right;Decreased dorsiflexion - left;Decreased weight shift to right;Decreased weight shift to left;Right foot flat;Left foot flat;Trendelenburg;Trunk  flexed      Knee/Hip Exercises: Seated   Sit to Sand  5 reps;without UE support      Knee/Hip Exercises: Supine   Bridges  Both;2 sets;10 reps   superset with [employer] Limitations  3 second holds     Straight Leg Raises  Both;2 sets;10 reps    Straight Leg Raises Limitations  superset with bridges     Other Supine Knee/Hip Exercises  supine clams 2x10 red band superset with supine band marches     Other Supine Knee/Hip Exercises  supine marches red TB 2x10 superset with clams              PT Education - 03/11/19 1707    Education Details  POC moving forward    Person(s) Educated  Patient    Methods  Explanation    Comprehension  Verbalized understanding       PT Short Term Goals - 03/11/19 1638      PT SHORT TERM GOAL #1   Title  Patient will be independent with HEP, updated PRN, to improve functional strength to improve mobility and return to PLOF.    Baseline  7/31- going well    Time  2    Period  Weeks    Status  Achieved      PT SHORT TERM GOAL #2   Title  Patient will improve LE strength by 1 grade with MMT to indicate significant improvement in strength.    Baseline  7/31- flowsheet    Time  4    Period  Weeks    Status  On-going      PT SHORT TERM GOAL #3   Title  Patient will perform SLS for 10 seconds on bil LE to indicate improved glut med strangth and improved balance for stair mobility and gait.    Baseline  7/31- 3 seconds at best    Time  4      PT SHORT TERM GOAL #4   Title  Patient will ambulate at 0.8 m/s or faster with LRAD during 2 MWT to indicate improved endurance and gait.    Baseline  7/31- 2MWT 261ft    Time  4    Period  Weeks    Status  On-going        PT Long Term Goals - 03/11/19 1640      PT LONG TERM GOAL #1   Time  8    Period  Weeks    Status   On-going      PT LONG TERM GOAL #2   Title  Patient will ascend/descend 4 6" stairs with no hand rail at independent level with no overt LOB.    Time  8    Period  Weeks    Status  On-going      PT LONG TERM GOAL #3   Title  Patient will report no low back or posterior leg pain for 2 week period that is above 3/10 indicating improved activity tolerance.    Time  8    Period  Weeks    Status  On-going            Plan - 03/11/19 1711    Clinical Impression Statement  Re-assessment performed today due to patient being out of therapy for 3 weeks due to medical reasons and due to end of MD certification period. Patient does appear to have lost some functional mobility as evidenced by tendency to drift  to the left during gait and reduction in distance ambulated during 2MWT. He also continues to demonstrate functional strength and balance deficits as evidenced by SLS test and MMT performed this session. Recommend that he continue with skilled PT services to continue addressing functional deficits, reduce fall risk, and assist in reaching optimal level of function moving forward.    Personal Factors and Comorbidities  Fitness;Comorbidity 2    Comorbidities  DM, HTN, recent sepsis with prolonged hospitalization and weight loss    Examination-Activity Limitations  Bathing;Stairs;Stand;Lift;Locomotion Level    Examination-Participation Restrictions  Laundry;Yard Work;Driving;Community Activity;Cleaning    Stability/Clinical Decision Making  Stable/Uncomplicated    Clinical Decision Making  Low    Rehab Potential  Good    PT Frequency  2x / week    PT Duration  6 weeks    PT Treatment/Interventions  ADLs/Self Care Home Management;Aquatic Therapy;Cryotherapy;Electrical Stimulation;Moist Heat;Gait training;DME Instruction;Stair training;Functional mobility training;Therapeutic activities;Therapeutic exercise;Balance training;Neuromuscular re-education;Patient/family education;Manual  techniques;Passive range of motion;Dry needling;Taping    PT Next Visit Plan  Progress weight with leg press PRN. Continue to increase LE strengthening and balance.  Continue with machines for strengthening. Progress balance and challenge as able. Continue gait training without AD.    PT Home Exercise Plan  Eval: clamshell with band, seated hamstring stretch  6/16:  standing heelraises, toeraises; 02/08/19: standing hip abd/ext infront of sink/counter with RTB around thigh    Consulted and Agree with Plan of Care  Patient       Patient will benefit from skilled therapeutic intervention in order to improve the following deficits and impairments:  Abnormal gait, Decreased activity tolerance, Decreased balance, Decreased mobility, Difficulty walking, Decreased endurance, Decreased coordination, Decreased strength, Pain, Postural dysfunction, Impaired flexibility  Visit Diagnosis: 1. Difficulty in walking, not elsewhere classified   2. Muscle weakness (generalized)        Problem List Patient Active Problem List   Diagnosis Date Noted  . Diabetic retinopathy of both eyes ([place]) 01/31/2019  . Type 2 diabetes mellitus with hyperglycemia, with long-term current use of insulin ([place]) 10/20/2018  . Sepsis due to Streptococcus, group B ([place]) 10/05/2018  . Dietary counseling and surveillance 09/15/2018  . Non compliance with medical treatment 09/15/2018  . Open wound of right great toe   . Septic arthritis of left sternoclavicular joint ([place])   . Mediastinal abscess ([place]) 08/27/2018  . Cellulitis of great toe, right   . Diabetic hyperosmolar non-ketotic state ([place]) 08/17/2018  . Hyperglycemia 08/17/2018  . AKI (acute kidney injury) ([place]) 08/16/2018  . Hyponatremia 08/16/2018  . Hyperlipidemia associated with type 2 diabetes mellitus ([place]) 03/04/2017  . Erectile dysfunction 03/04/2017  . Personal history of noncompliance with medical treatment, presenting hazards to health 11/22/2015  . HTN  (hypertension), benign 01/30/2014  . Loss of weight 01/30/2014  . Type 2 diabetes mellitus not at goal [facility]) 03/30/2013    Deniece Ree PT, DPT, CBIS  Supplemental Physical Therapist [facility]    Pager [phone] Acute Rehab Office [place] [address] [place], [place], [address] Phone: [phone]   Fax:  [phone]  Name: Randy Oneal MRN: [id number] Date of Birth: 05-25-1966

## 2019-03-14 ENCOUNTER — Telehealth (HOSPITAL_COMMUNITY): Payer: Self-pay | Admitting: Family Medicine

## 2019-03-14 ENCOUNTER — Ambulatory Visit (HOSPITAL_COMMUNITY): Payer: Managed Care, Other (non HMO) | Admitting: Physical Therapy

## 2019-03-14 ENCOUNTER — Encounter: Payer: Self-pay | Admitting: Family Medicine

## 2019-03-14 NOTE — Telephone Encounter (Signed)
03/14/19  patient's daughter called to reschedule the 8/3 appt

## 2019-03-14 NOTE — Telephone Encounter (Signed)
Sent referral & records electronically through [facility] will contact the pt to schedule

## 2019-03-15 ENCOUNTER — Ambulatory Visit (HOSPITAL_COMMUNITY): Payer: Managed Care, Other (non HMO) | Admitting: Physical Therapy

## 2019-03-17 ENCOUNTER — Encounter (HOSPITAL_COMMUNITY): Payer: Self-pay

## 2019-03-17 ENCOUNTER — Ambulatory Visit (HOSPITAL_COMMUNITY): Payer: Managed Care, Other (non HMO) | Attending: Family Medicine

## 2019-03-17 ENCOUNTER — Other Ambulatory Visit: Payer: Self-pay

## 2019-03-17 DIAGNOSIS — M6281 Muscle weakness (generalized): Secondary | ICD-10-CM | POA: Diagnosis present

## 2019-03-17 DIAGNOSIS — R262 Difficulty in walking, not elsewhere classified: Secondary | ICD-10-CM | POA: Insufficient documentation

## 2019-03-17 NOTE — Telephone Encounter (Signed)
Called Randy Oneal's office concerning restricitons with exercises as pt stated initially to complete light activities though following 2nd apt wiht MD release to resume all exercises.  Called and left message on nurses message to assure.  [address] [place]; CBIS [phone]

## 2019-03-17 NOTE — Therapy (Addendum)
[place], [place], [address] Phone: [phone]   Fax:  [phone]  Physical Therapy Treatment  Patient Details  Name: Randy Oneal MRN: [id number] Date of Birth: 1966-02-24 Referring Provider (PT): Sallee Lange    Encounter Date: 03/17/2019  PT End of Session - 03/17/19 1524    Visit Number  12    Number of Visits  20   insurance 20 visit limit   Date for PT Re-Evaluation  04/21/19   Minireassess 8/21/203   Authorization Type  [employer] Managed (20 visit limit per calander year, 0 used at eval, no auth required)    Authorization Time Period  2/40/9735-11/07/90; new cert 12/04/81 to 11/27/60    Authorization - Visit Number  12    Authorization - Number of Visits  20    PT Start Time  1520    PT Stop Time  1602    PT Time Calculation (min)  42 min    Equipment Utilized During Treatment  Gait belt    Activity Tolerance  Patient tolerated treatment well    Behavior During Therapy  [facility] for tasks assessed/performed       Past Medical History:  Diagnosis Date  . Diabetes mellitus    Insulin dependent  . Diabetic retinopathy of both eyes ([place]) 01/31/2019   Dr Coralyn Pear 01/2019  . Hypertension   . Renal insufficiency   . Sepsis due to Streptococcus, group B ([place]) 10/05/2018    Past Surgical History:  Procedure Laterality Date  . [place] VITRECTOMY WITH 20 GAUGE MVR PORT FOR MACULAR HOLE Right 02/24/2019   Procedure: 25 GAUGE PARS PLANA VITRECTOMY WITH 20 GAUGE MVR PORT FOR MACULAR HOLE;  Surgeon: Bernarda Caffey, MD;  Location: [place];  Service: Ophthalmology;  Laterality: Right;  . APPLICATION OF WOUND VAC Left 08/27/2018   Procedure: APPLICATION OF WOUND VAC, left neck;  Surgeon: Gaye Pollack, MD;  Location: [place];  Service: Thoracic;  Laterality: Left;  . APPLICATION OF WOUND VAC Left 08/30/2018   Procedure: APPLICATION OF WOUND VAC;  Surgeon: Gaye Pollack, MD;  Location: [place];  Service: Thoracic;   Laterality: Left;  . CATARACT EXTRACTION W/ INTRAOCULAR LENS  IMPLANT, BILATERAL    . COLONOSCOPY  06/24/2011   Procedure: COLONOSCOPY;  Surgeon: [name], MD;  [facility];  Service: Endoscopy;  Laterality: N/A;  8:30 AM  . EYE SURGERY    . INJECTION OF SILICONE OIL  2/29/7989   Procedure: Injection Of Silicone Oil;  Surgeon: Bernarda Caffey, MD;  Location: [place];  Service: Ophthalmology;;  . MEMBRANE PEEL Right 02/24/2019   Procedure: Antoine Primas;  Surgeon: Bernarda Caffey, MD;  Location: [place];  Service: Ophthalmology;  Laterality: Right;  . PHOTOCOAGULATION WITH LASER Right 02/24/2019   Procedure: Photocoagulation With Laser;  Surgeon: Bernarda Caffey, MD;  Location: [place];  Service: Ophthalmology;  Laterality: Right;  . STERNAL WOUND DEBRIDEMENT Left 08/27/2018   Procedure: Incision and DEBRIDEMENT Left Chest, Neck and Mediastinum;  Surgeon: Gaye Pollack, MD;  Location: [facility];  Service: Thoracic;  Laterality: Left;  . STERNAL WOUND DEBRIDEMENT Left 08/30/2018   Procedure: WOUND VAC CHANGE, LEFT CHEST AND NECK, POSSIBLE DEBRIDEMENT;  Surgeon: Gaye Pollack, MD;  Location: [place];  Service: Thoracic;  Laterality: Left;    There were no vitals filed for this visit.  Subjective Assessment - 03/17/19 1522    Subjective  Pt reports he's feeling  good today, general soreness like at the gym.  Reports 2nd visit everything  looked good,to MD permitted him to return to all exercises.    Pertinent History  Randy Oneal presented to [facility] with signs of sepsis. He was transferred to [facility] where he underwent surgery on 08/27/18 for Lt sternoclavicular infection with abscess to Lt neck and mediastinum. The wound was debrided and wound vac changed again on 08/30/18. Pt was discharged on 09/05/18 with home care to manage wound vac and IV line. On 01/19/19 patient had his final follow up with his surgeon, Gaye Pollack, MD, and was discharged as wound was fully healed and no signs of infection  were present any longer. He is to follow up with his PCP no, Sallee Lange, MD.    Currently in Pain?  No/denies                       [facility] Adult PT Treatment/Exercise - 03/17/19 0001      Ambulation/Gait   Ambulation/Gait Assistance  4: Min guard    Ambulation Distance (Feet)  226 Feet    Assistive device  None    Gait Pattern  Step-through pattern;Decreased step length - left;Decreased stance time - right;Decreased stance time - left;Decreased stride length;Decreased dorsiflexion - right;Decreased dorsiflexion - left;Decreased weight shift to right;Decreased weight shift to left;Right foot flat;Left foot flat;Trendelenburg;Trunk flexed      Knee/Hip Exercises: Machines for Strengthening   Total Gym Leg Press  Resume next session (if MD calls back and gives the ok)      Knee/Hip Exercises: Standing   Forward Lunges  Both;15 reps    Forward Lunges Limitations  4" step, bil UE support,a    Lateral Step Up  Both;1 set;10 reps;Hand Hold: 1;Step Height: 6"    Functional Squat  2 sets;10 reps    Stairs  reciprocal pattern 7in stairs    Other Standing Knee Exercises  paloff partial tandem stance 4x 10    Other Standing Knee Exercises  2RT sidestepping down blue line with RTB, no AD                PT Short Term Goals - 03/11/19 1638      PT SHORT TERM GOAL #1   Title  Patient will be independent with HEP, updated PRN, to improve functional strength to improve mobility and return to PLOF.    Baseline  7/31- going well    Time  2    Period  Weeks    Status  Achieved      PT SHORT TERM GOAL #2   Title  Patient will improve LE strength by 1 grade with MMT to indicate significant improvement in strength.    Baseline  7/31- flowsheet    Time  4    Period  Weeks    Status  On-going      PT SHORT TERM GOAL #3   Title  Patient will perform SLS for 10 seconds on bil LE to indicate improved glut med strangth and improved balance for stair mobility and gait.     Baseline  7/31- 3 seconds at best    Time  4      PT SHORT TERM GOAL #4   Title  Patient will ambulate at 0.8 m/s or faster with LRAD during 2 MWT to indicate improved endurance and gait.    Baseline  7/31- 2MWT 272ft    Time  4  Period  Weeks    Status  On-going        PT Long Term Goals - 03/11/19 1640      PT LONG TERM GOAL #1   Time  8    Period  Weeks    Status  On-going      PT LONG TERM GOAL #2   Title  Patient will ascend/descend 4 6" stairs with no hand rail at independent level with no overt LOB.    Time  8    Period  Weeks    Status  On-going      PT LONG TERM GOAL #3   Title  Patient will report no low back or posterior leg pain for 2 week period that is above 3/10 indicating improved activity tolerance.    Time  8    Period  Weeks    Status  On-going            Plan - 03/17/19 1633    Clinical Impression Statement  Continued with established POC for functional strengthening and balance.  Held on heavy activities following eye procedure.  Called and left message with MD Zam[place]'s nurse for clearance to resume.  Pt improving arm swing and stability with gait though does continue to demonstrate instability without AD due to weak hip musculature.  Added paloff with balance to improve stability with UE movements with some LOB, pt able to regain without assistance.  Pt limited by fatigue at EOS, no reports of pain.    Personal Factors and Comorbidities  Fitness;Comorbidity 2    Comorbidities  DM, HTN, recent sepsis with prolonged hospitalization and weight loss    Examination-Activity Limitations  Bathing;Stairs;Stand;Lift;Locomotion Level    Examination-Participation Restrictions  Laundry;Yard Work;Driving;Community Activity;Cleaning    Stability/Clinical Decision Making  Stable/Uncomplicated    Clinical Decision Making  Low    Rehab Potential  Good    PT Frequency  2x / week    PT Duration  6 weeks    PT Treatment/Interventions  ADLs/Self Care Home  Management;Aquatic Therapy;Cryotherapy;Electrical Stimulation;Moist Heat;Gait training;DME Instruction;Stair training;Functional mobility training;Therapeutic activities;Therapeutic exercise;Balance training;Neuromuscular re-education;Patient/family education;Manual techniques;Passive range of motion;Dry needling;Taping    PT Next Visit Plan  Pt with MD on Monday.  Following call on 03/18/19 from Dr Dairl Ponder office no bending forward and weight limit at 15#.  Continue functional strengthening and balance training.  No machine for strengthening until cleared by MD.  Continue gait training without AD.     PT Home Exercise Plan  Eval: clamshell with band, seated hamstring stretch  6/16:  standing heelraises, toeraises; 02/08/19: standing hip abd/ext infront of sink/counter with RTB around thigh       Patient will benefit from skilled therapeutic intervention in order to improve the following deficits and impairments:  Abnormal gait, Decreased activity tolerance, Decreased balance, Decreased mobility, Difficulty walking, Decreased endurance, Decreased coordination, Decreased strength, Pain, Postural dysfunction, Impaired flexibility  Visit Diagnosis: 1. Muscle weakness (generalized)   2. Difficulty in walking, not elsewhere classified        Problem List Patient Active Problem List   Diagnosis Date Noted  . Diabetic retinopathy of both eyes ([place]) 01/31/2019  . Type 2 diabetes mellitus with hyperglycemia, with long-term current use of insulin ([place]) 10/20/2018  . Sepsis due to Streptococcus, group B ([place]) 10/05/2018  . Dietary counseling and surveillance 09/15/2018  . Non compliance with medical treatment 09/15/2018  . Open wound of right great toe   . Septic arthritis of left sternoclavicular joint (  [place])   . Mediastinal abscess ([place]) 08/27/2018  . Cellulitis of great toe, right   . Diabetic hyperosmolar non-ketotic state ([place]) 08/17/2018  . Hyperglycemia 08/17/2018  . AKI (acute kidney injury)  ([place]) 08/16/2018  . Hyponatremia 08/16/2018  . Hyperlipidemia associated with type 2 diabetes mellitus ([place]) 03/04/2017  . Erectile dysfunction 03/04/2017  . Personal history of noncompliance with medical treatment, presenting hazards to health 11/22/2015  . HTN (hypertension), benign 01/30/2014  . Loss of weight 01/30/2014  . Type 2 diabetes mellitus not at goal [facility]) 03/30/2013   Ihor Austin, [place]; [place]  Aldona Lento 03/17/2019, 4:48 PM  [place] [address] [place], [place], [address] Phone: [phone]   Fax:  [phone]  Name: Randy Oneal MRN: [id number] Date of Birth: 01-Jan-1966

## 2019-03-18 ENCOUNTER — Telehealth (HOSPITAL_COMMUNITY): Payer: Self-pay

## 2019-03-18 NOTE — Telephone Encounter (Signed)
[facility] manager received call back from MD [place] office with a 15# weight limit.  Called office back to speak to nurse concerning weight limit restricitons wiht contact number given and resquest response.    [address], [place]; CBIS [phone]

## 2019-03-18 NOTE — Telephone Encounter (Signed)
Glenda called back with specific instruction for no weight lifting greater than 15# and No bending forward.  Did stated STS and squats are ok.  Hold on leg press and any weight until cleared by MD.  Ihor Austin, [place]; CBIS [phone]

## 2019-03-21 ENCOUNTER — Encounter (INDEPENDENT_AMBULATORY_CARE_PROVIDER_SITE_OTHER): Payer: Managed Care, Other (non HMO) | Admitting: Ophthalmology

## 2019-03-22 ENCOUNTER — Telehealth (HOSPITAL_COMMUNITY): Payer: Self-pay | Admitting: Family Medicine

## 2019-03-22 ENCOUNTER — Ambulatory Visit (HOSPITAL_COMMUNITY): Payer: Managed Care, Other (non HMO)

## 2019-03-22 NOTE — Progress Notes (Signed)
[facility] Note  03/24/2019     CHIEF COMPLAINT Patient presents for Post-op Follow-up   HISTORY OF PRESENT ILLNESS: Randy Oneal is a 53 y.o. male who presents to the clinic today for:   HPI    Post-op Follow-up    In right eye.  Discomfort includes Negative for pain, itching, foreign body sensation, tearing, discharge, floaters and none.  Vision is stable, is blurred at distance and is blurred at near.  I, the attending physician,  performed the HPI with the patient and updated documentation appropriately.          Comments    Patient states OD feels a little sore at times, vision about the same OD, still blurred at distance and near.        Last edited by Bernarda Caffey, MD on 03/24/2019  8:13 AM. (History)    Pt states he is doing okay, he states his vision seems to be getting better, but it looks "watery"  Referring physician: [name], MD [address] [place],  [place] [address]  HISTORICAL INFORMATION:   Selected notes from the MEDICAL RECORD NUMBER Referred by Dr.Mark Gershon Crane for concern of decreased vision post cataract sx LEE:  Ocular Hx- PMH-   CURRENT MEDICATIONS: Current Outpatient Medications (Ophthalmic Drugs)  Medication Sig  . bacitracin-polymyxin b (POLYSPORIN) ophthalmic ointment 1 application at bedtime. apply to eye every 12 hours while awake  . dorzolamide-timolol (COSOPT) 22.3-6.8 MG/ML ophthalmic solution 1 drop daily.  . prednisoLONE acetate (PRED FORTE) 1 % ophthalmic suspension Place 1 drop into the right eye 4 (four) times daily.   No current facility-administered medications for this visit.  (Ophthalmic Drugs)   Current Outpatient Medications (Other)  Medication Sig  . amLODipine (NORVASC) 10 MG tablet Take one tablet by mouth each day  . atorvastatin (LIPITOR) 10 MG tablet Take 1 tablet (10 mg total) by mouth daily at 6 PM.  . Continuous Blood Gluc Sensor (FREESTYLE LIBRE SENSOR SYSTEM)  MISC Use one sensor every 10 days.  . famotidine (PEPCID) 40 MG tablet Take one tablet by mouth daily  . furosemide (LASIX) 40 MG tablet Take 1 tablet (40 mg total) by mouth daily as needed. Take for weight gain of 3-5 lbs (Patient taking differently: Take 40 mg by mouth daily as needed for fluid or edema. Take for weight gain of 3-5 lbs)  . glucose blood (BAYER CONTOUR NEXT TEST) test strip USE TO TEST FOUR TIMES DAILY.  Marland Kitchen insulin lispro (HUMALOG KWIKPEN) 100 UNIT/ML KwikPen Inject 0.06 mLs (6 Units total) into the skin daily with breakfast AND 0.07 mLs (7 Units total) 2 (two) times daily before lunch and supper. (Patient taking differently: Inject 7 Units into the skin daily with breakfast AND 6 Units total 2 (two) times daily before lunch and supper.)  . Insulin Pen Needle (BD PEN NEEDLE NANO 2ND GEN) 32G X 4 MM MISC Four times daily  . metoprolol tartrate (LOPRESSOR) 50 MG tablet Take one half tablet by mouth twice daily (Patient taking differently: Take 25 mg by mouth 2 (two) times daily. )  . MICROLET LANCETS MISC USE FOUR TIMES A DAY AS DIRECTED.  Marland Kitchen nystatin (MYCOSTATIN/NYSTOP) powder Apply topically 2 (two) times daily. To groin and scrotum  . pantoprazole (PROTONIX) 40 MG tablet Take 1 tablet (40 mg total) by mouth daily.  . potassium chloride SA (K-DUR,KLOR-CON) 20 MEQ tablet Take 1 tablet (20 mEq total) by mouth daily as  needed. Only take on days you use Lasix  . sildenafil (REVATIO) 20 MG tablet May take up to 5 before relations as directed (Patient taking differently: Take 20-100 mg by mouth daily as needed (erectile dysfunction). )  . TRESIBA FLEXTOUCH 100 UNIT/ML SOPN FlexTouch Pen Inject 0.19 mLs (19 Units total) into the skin at bedtime. (Patient taking differently: Inject 22 Units into the skin at bedtime. )   No current facility-administered medications for this visit.  (Other)      REVIEW OF SYSTEMS: ROS    Positive for: Endocrine, Eyes   Negative for: Constitutional,  Gastrointestinal, Neurological, Skin, Genitourinary, Musculoskeletal, HENT, Cardiovascular, Respiratory, Psychiatric, Allergic/Imm, Heme/Lymph   Last edited by Roselee Nova D on 03/24/2019  8:03 AM. (History)       ALLERGIES Allergies  Allergen Reactions  . Tramadol Nausea And Vomiting    Nausea and vomiting    PAST MEDICAL HISTORY Past Medical History:  Diagnosis Date  . Diabetes mellitus    Insulin dependent  . Diabetic retinopathy of both eyes ([place]) 01/31/2019   Dr Coralyn Pear 01/2019  . Hypertension   . Renal insufficiency   . Sepsis due to Streptococcus, group B ([place]) 10/05/2018   Past Surgical History:  Procedure Laterality Date  . [place] VITRECTOMY WITH 20 GAUGE MVR PORT FOR MACULAR HOLE Right 02/24/2019   Procedure: 25 GAUGE PARS PLANA VITRECTOMY WITH 20 GAUGE MVR PORT FOR MACULAR HOLE;  Surgeon: Bernarda Caffey, MD;  Location: [facility];  Service: Ophthalmology;  Laterality: Right;  . APPLICATION OF WOUND VAC Left 08/27/2018   Procedure: APPLICATION OF WOUND VAC, left neck;  Surgeon: Gaye Pollack, MD;  Location: [facility];  Service: Thoracic;  Laterality: Left;  . APPLICATION OF WOUND VAC Left 08/30/2018   Procedure: APPLICATION OF WOUND VAC;  Surgeon: Gaye Pollack, MD;  Location: [facility];  Service: Thoracic;  Laterality: Left;  . CATARACT EXTRACTION W/ INTRAOCULAR LENS  IMPLANT, BILATERAL    . COLONOSCOPY  06/24/2011   Procedure: COLONOSCOPY;  Surgeon: [name], MD;  [facility];  Service: Endoscopy;  Laterality: N/A;  8:30 AM  . EYE SURGERY    . INJECTION OF SILICONE OIL  9/41/7408   Procedure: Injection Of Silicone Oil;  Surgeon: Bernarda Caffey, MD;  Location: [facility];  Service: Ophthalmology;;  . MEMBRANE PEEL Right 02/24/2019   Procedure: Antoine Primas;  Surgeon: Bernarda Caffey, MD;  Location: [facility];  Service: Ophthalmology;  Laterality: Right;  . PHOTOCOAGULATION WITH LASER Right 02/24/2019   Procedure: Photocoagulation With Laser;  Surgeon: Bernarda Caffey, MD;  Location: [facility];  Service: Ophthalmology;  Laterality: Right;  . STERNAL WOUND DEBRIDEMENT Left 08/27/2018   Procedure: Incision and DEBRIDEMENT Left Chest, Neck and Mediastinum;  Surgeon: Gaye Pollack, MD;  Location[facility];  Service: Thoracic;  Laterality: Left;  . STERNAL WOUND DEBRIDEMENT Left 08/30/2018   Procedure: WOUND VAC CHANGE, LEFT CHEST AND NECK, POSSIBLE DEBRIDEMENT;  Surgeon: Gaye Pollack, MD;  Location: [facility];  Service: Thoracic;  Laterality: Left;    FAMILY HISTORY Family History  Problem Relation Age of Onset  . Hypertension Mother   . Colon cancer Neg Hx     SOCIAL HISTORY Social History   Tobacco Use  . Smoking status: Never Smoker  . Smokeless tobacco: Never Used  Substance Use Topics  . Alcohol use: No  . Drug use: No         OPHTHALMIC EXAM:  Base Eye Exam  Visual Acuity (Snellen - Linear)      Right Left   Dist [place] 20/800 20/100   Dist ph  NI NI       Tonometry (Tonopen, 8:15 AM)      Right Left   Pressure 08        Pupils      Dark Light Shape React APD   Right 7 7 Round None None   Left 3 2 Round Minimal None       Visual Fields (Counting fingers)      Left Right     Full   Restrictions Partial outer superior nasal, inferior nasal deficiencies        Extraocular Movement      Right Left    Abnormal, Ortho Full, Ortho    -4 -- --  -4  --  -4 -- --   -- -- --  --  --  -- -- --         Neuro/Psych    Oriented x3: Yes   Mood/Affect: Normal       Dilation    Right eye: 1.0% Mydriacyl, 2.5% Phenylephrine @ 8:15 AM        Slit Lamp and Fundus Exam    Slit Lamp Exam      Right Left   Lids/Lashes mild Meibomian gland dysfunction mild Meibomian gland dysfunction   Conjunctiva/Sclera  subconj silicon oil, sutures intact White and quiet   Cornea 1+ PEE mild Arcus, 2+ Descemet's folds, Well healed temporal cataract wounds   Anterior Chamber Deep, clear Deep, 3-4+pigment   Iris slightly irregular,  dilated, posterior synchiae @ 2:00, 3:00-7:30 and 10:30 Round and dilated   Lens PC IOL in good position, 1+ PCO PC IOL in good position   Vitreous Post vit, silicon oil bubble ~41%. Vitreous syneresis, +cell/pigment in anterior vitreous, vitreous hemorrhage clearing and settling inferiorly, Posterior vitreous detachment, vitreous condensations       Fundus Exam      Right Left   Disc Mild pallor, sharp rim, central heme resolved +fibrosis, regressing NVD?   C/D Ratio 0.4    Macula Flat good foveal reflex under oil, scattered MA, improved fibrosis Blunted foveal reflex, +stria and pucker; tractional fibrosis along IT arcades, Focal TRD with retinal tear along distal ST arcade, slightly worse   Vessels attenuated; tortuous; improved fibrosis -  mild fibrotic remnants attenuated; fibrosis along IT arcades   Periphery Attached, good 360 PRP laser, mild residual shallow SRF inferiorly -- improving old VH settling inferiorly, 360 PRP laser changes--room for fill in, scattered fibrosis and DBH        Refraction    Manifest Refraction   Attempted +/- 9.00 sphere OD- no improvement          IMAGING AND PROCEDURES  Imaging and Procedures for @TODAY @  OCT, Retina - OU - Both Eyes       Right Eye Quality was good. Central Foveal Thickness: 537. Progression has improved. Findings include preretinal fibrosis, abnormal foveal contour, subretinal fluid, outer retinal atrophy, epiretinal membrane, intraretinal fluid (Macula reattached under oil, shallow SRF inferior periphery caught on widefield -- improved from prior).   Left Eye Quality was good. Central Foveal Thickness: 447. Progression has worsened. Findings include vitreous traction, preretinal fibrosis, intraretinal fluid, macular pucker, abnormal foveal contour, intraretinal hyper-reflective material, epiretinal membrane, subretinal fluid (interval increase in central IRF, mild interval progression of focal RD ST arcades, vitreous  opacities improving).   Notes *Images captured and stored  on drive  Diagnosis / Impression:  OD: Macula reattached under oil,  Macula reattached under oil, shallow SRF inferior periphery caught on widefield OS: Persistent vitreous opacities improving; interval increase in IRF, mild interval progression of focal RD ST arcades   Clinical management:  See below  Abbreviations: NFP - Normal foveal profile. CME - cystoid macular edema. PED - pigment epithelial detachment. IRF - intraretinal fluid. SRF - subretinal fluid. EZ - ellipsoid zone. ERM - epiretinal membrane. ORA - outer retinal atrophy. ORT - outer retinal tubulation. SRHM - subretinal hyper-reflective material        Intravitreal Injection, Pharmacologic Agent - OS - Left Eye       Time Out 03/24/2019. 9:11 AM. Confirmed correct patient, procedure, site, and patient consented.   Anesthesia Topical anesthesia was used. Anesthetic medications included Lidocaine 2%, Proparacaine 0.5%.   Procedure Preparation included 5% betadine to ocular surface, eyelid speculum. A 30 gauge needle was used.   Injection:  1.25 mg Bevacizumab (AVASTIN) SOLN   NDC: 37628-315-17, Lot: [id number] , Expiration date: 05/12/2019   Route: Intravitreal, Site: Left Eye, Waste: 0 mL  Post-op Post injection exam found visual acuity of at least counting fingers. The patient tolerated the procedure well. There were no complications. The patient received written and verbal post procedure care education.                 ASSESSMENT/PLAN:    ICD-10-CM   1. Proliferative diabetic retinopathy of both eyes with macular edema associated with type 2 diabetes mellitus (HCC)  23/08/2018 Intravitreal Injection, Pharmacologic Agent - OS - Left Eye    Bevacizumab (AVASTIN) SOLN 1.25 mg  2. 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 Vitreous hemorrhage of both eyes ([place])  H43.13   5. Retinal edema   H35.81 OCT, Retina - OU - Both Eyes  6. Essential hypertension  I10   7. Hypertensive retinopathy of both eyes  H35.033   8. Pseudophakia of both eyes  Z96.1     1-5. Proliferative diabetic retinopathy with DME, TRD, and vitreous hemorrhage OU (OD > OS)  - pt with complex medical history with hospitalization in Jan-Feb 2020 for bacteremia and abscess  - history of poor glycemic control for years  - s/p IVA OU #1 (06.02.20), #2 (07.01.20)  - s/p PRP OS (06.10.20)  - BCVA OD 20/800; OS 20/100  - exam shows diffuse vitreous and preretinal hemorrhage + scattered fibrosis OU   - pre-op clearing of VH OD has improved visualization of posterior pole --+macular TRD OD; and regressing NVD OS  - pre-op OCT shows preretinal fibrosis w/ traction OU (OD > OS)  - now POW4 s/p PPV/PFC/EL/FAX/silicone oil OD, 19.10.20             - doing well   - silicon oil prolapse subconj -- will plan on cleaning out next week with PPV OS  - apparent CN6 palsy OD complete loss of abduction OD -- suspect DM/ischemia related as no scleral buckle procedure was performed OD             - improved fibrosis, retina attached and in good position under oil, mild residual SRF inferiorly             - IOP 08             - cont PF to 4x/day OD  PSO ung QHS and prn OD  - stop Cosopt QD OD                                     - cont face down positioning 50% of time; avoid laying flat on back                       - post op drop and positioning instructions reviewed             - tylenol/ibuprofen for pain             - Rx given for breakthrough pain OS: exam today shows mild progression of focal RD (combined TRD + RRD), tractional fibrosis and central DME  - BCVA remains stable at 20/100, but RD threatening fovea  - recommend IVA OS #3 today (08.13.20) and 25g PPV w/ membrane peel, endolaser and gas vs oil OS under general anesthesia for retinal detachment repair  - RBA of procedures discussed,  questions answered  - informed consents obtained and signed  - see procedure note for IVA OS             - surgery scheduled for left eye for focal RD, Thursday, March 31, 2019, [facility] OR 8, 11:30am  - will need repeat COVID-19 testing  - f/u Friday, August 21 POV OU  6,7. Hypertensive retinopathy OU  - discussed importance of tight BP control  -monitor  8. Pseudophakia OU  - s/p CE/IOL OU (Dr. Gershon Crane)  - stable   Ophthalmic Meds Ordered this visit:  Meds ordered this encounter  Medications  . Bevacizumab (AVASTIN) SOLN 1.25 mg       Return for f/u 1 week, POV OU.  There are no Patient Instructions on file for this visit.   Explained the diagnoses, plan, and follow up with the patient and they expressed understanding.  Patient expressed understanding of the importance of proper follow up care.   This document serves as a record of services personally performed by Gardiner Sleeper, MD, PhD. It was created on their behalf by Roselee Nova, COMT. The creation of this record is the provider's dictation and/or activities during the visit.  Electronically signed by: Roselee Nova, COMT 03/24/19 10:43 AM   This document serves as a record of services personally performed by Gardiner Sleeper, MD, PhD. It was created on their behalf by Ernest Mallick, OA, an ophthalmic assistant. The creation of this record is the provider's dictation and/or activities during the visit.    Electronically signed by: Ernest Mallick, OA 08.13.2020 10:43 AM     Gardiner Sleeper, M.D., Ph.D. Diseases & Surgery of the Retina and Vitreous [facility]  I have reviewed the above documentation for accuracy and completeness, and I agree with the above. Gardiner Sleeper, M.D., Ph.D. 03/24/19 10:43 AM     Abbreviations: M myopia (nearsighted); A astigmatism; H hyperopia (farsighted); P presbyopia; Mrx spectacle prescription;  CTL contact lenses; OD right eye; OS left eye; OU both eyes  XT  exotropia; ET esotropia; PEK punctate epithelial keratitis; PEE punctate epithelial erosions; DES dry eye syndrome; MGD meibomian gland dysfunction; ATs artificial tears; PFAT's preservative free artificial tears; [place] nuclear sclerotic cataract; PSC posterior subcapsular cataract; ERM epi-retinal membrane; PVD posterior vitreous detachment; RD retinal detachment; DM diabetes mellitus; DR diabetic retinopathy; NPDR non-proliferative diabetic retinopathy; PDR proliferative diabetic  retinopathy; CSME clinically significant macular edema; DME diabetic macular edema; dbh dot blot hemorrhages; CWS cotton wool spot; POAG primary open angle glaucoma; C/D cup-to-disc ratio; HVF humphrey visual field; GVF goldmann visual field; OCT optical coherence tomography; IOP intraocular pressure; BRVO Branch retinal vein occlusion; CRVO central retinal vein occlusion; CRAO central retinal artery occlusion; BRAO branch retinal artery occlusion; RT retinal tear; SB scleral buckle; PPV pars plana vitrectomy; VH Vitreous hemorrhage; PRP panretinal laser photocoagulation; IVK intravitreal kenalog; VMT vitreomacular traction; MH Macular hole;  NVD neovascularization of the disc; NVE neovascularization elsewhere; AREDS age related eye disease study; ARMD age related macular degeneration; POAG primary open angle glaucoma; EBMD epithelial/anterior basement membrane dystrophy; ACIOL anterior chamber intraocular lens; IOL intraocular lens; PCIOL posterior chamber intraocular lens; Phaco/IOL phacoemulsification with intraocular lens placement; [place] photorefractive keratectomy; LASIK laser assisted in situ keratomileusis; HTN hypertension; DM diabetes mellitus; COPD chronic obstructive pulmonary disease

## 2019-03-22 NOTE — Telephone Encounter (Signed)
pt called and cx today's appt... he thought it was on Wed. but when I looked there was a 930 and I offered that but he was having some more work on his eyes so he said he would just be here on Friday 8/14  03/22/19

## 2019-03-23 ENCOUNTER — Encounter (INDEPENDENT_AMBULATORY_CARE_PROVIDER_SITE_OTHER): Payer: Managed Care, Other (non HMO) | Admitting: Ophthalmology

## 2019-03-24 ENCOUNTER — Other Ambulatory Visit: Payer: Self-pay

## 2019-03-24 ENCOUNTER — Encounter (INDEPENDENT_AMBULATORY_CARE_PROVIDER_SITE_OTHER): Payer: Self-pay | Admitting: Ophthalmology

## 2019-03-24 ENCOUNTER — Ambulatory Visit (INDEPENDENT_AMBULATORY_CARE_PROVIDER_SITE_OTHER): Payer: Managed Care, Other (non HMO) | Admitting: Ophthalmology

## 2019-03-24 DIAGNOSIS — E113513 Type 2 diabetes mellitus with proliferative diabetic retinopathy with macular edema, bilateral: Secondary | ICD-10-CM | POA: Diagnosis not present

## 2019-03-24 DIAGNOSIS — H4313 Vitreous hemorrhage, bilateral: Secondary | ICD-10-CM

## 2019-03-24 DIAGNOSIS — H3343 Traction detachment of retina, bilateral: Secondary | ICD-10-CM

## 2019-03-24 DIAGNOSIS — I10 Essential (primary) hypertension: Secondary | ICD-10-CM

## 2019-03-24 DIAGNOSIS — H4921 Sixth [abducent] nerve palsy, right eye: Secondary | ICD-10-CM

## 2019-03-24 DIAGNOSIS — H3581 Retinal edema: Secondary | ICD-10-CM

## 2019-03-24 DIAGNOSIS — Z961 Presence of intraocular lens: Secondary | ICD-10-CM

## 2019-03-24 DIAGNOSIS — H35033 Hypertensive retinopathy, bilateral: Secondary | ICD-10-CM

## 2019-03-24 MED ORDER — BEVACIZUMAB CHEMO INJECTION 1.25MG/0.05ML SYRINGE FOR KALEIDOSCOPE
1.2500 mg | INTRAVITREAL | Status: AC | PRN
  Administered 2019-03-24: 1.25 mg via INTRAVITREAL

## 2019-03-25 ENCOUNTER — Ambulatory Visit (HOSPITAL_COMMUNITY): Payer: Managed Care, Other (non HMO) | Admitting: Physical Therapy

## 2019-03-25 ENCOUNTER — Encounter (HOSPITAL_COMMUNITY): Payer: Self-pay | Admitting: Physical Therapy

## 2019-03-25 DIAGNOSIS — M6281 Muscle weakness (generalized): Secondary | ICD-10-CM | POA: Diagnosis not present

## 2019-03-25 DIAGNOSIS — R262 Difficulty in walking, not elsewhere classified: Secondary | ICD-10-CM

## 2019-03-25 NOTE — Therapy (Signed)
[place], [place], [address] Phone: [phone]   Fax:  [phone]  Physical Therapy Treatment  Patient Details  Name: Randy Oneal MRN: [id number] Date of Birth: [birth year] Referring Provider (PT): Sallee Lange    Encounter Date: 03/25/2019  PT End of Session - 03/25/19 1506    Visit Number  13    Number of Visits  20    Date for PT Re-Evaluation  04/21/19    Authorization Type  [employer] Managed (20 visit limit per calander year, 0 used at eval, no auth required)    Authorization Time Period  05/01/1940-7/40/81; new cert 4/48/18 to 5/63/14    Authorization - Visit Number  13    Authorization - Number of Visits  20    PT Start Time  1416    PT Stop Time  1456    PT Time Calculation (min)  40 min    Activity Tolerance  Patient tolerated treatment well    Behavior During Therapy  [facility] for tasks assessed/performed       Past Medical History:  Diagnosis Date  . Diabetes mellitus    Insulin dependent  . Diabetic retinopathy of both eyes ([place]) 01/31/2019   Dr Coralyn Pear 01/2019  . Hypertension   . Renal insufficiency   . Sepsis due to Streptococcus, group B ([place]) 10/05/2018    Past Surgical History:  Procedure Laterality Date  . [place] VITRECTOMY WITH 20 GAUGE MVR PORT FOR MACULAR HOLE Right 02/24/2019   Procedure: 25 GAUGE PARS PLANA VITRECTOMY WITH 20 GAUGE MVR PORT FOR MACULAR HOLE;  Surgeon: Bernarda Caffey, MD;  Location: [place];  Service: Ophthalmology;  Laterality: Right;  . APPLICATION OF WOUND VAC Left 08/27/2018   Procedure: APPLICATION OF WOUND VAC, left neck;  Surgeon: Gaye Pollack, MD;  Location: [place];  Service: Thoracic;  Laterality: Left;  . APPLICATION OF WOUND VAC Left 08/30/2018   Procedure: APPLICATION OF WOUND VAC;  Surgeon: Gaye Pollack, MD;  Location: [place];  Service: Thoracic;  Laterality: Left;  . CATARACT EXTRACTION W/ INTRAOCULAR LENS  IMPLANT, BILATERAL    . COLONOSCOPY   06/24/2011   Procedure: COLONOSCOPY;  Surgeon: [name], MD;  [facility];  Service: Endoscopy;  Laterality: N/A;  8:30 AM  . EYE SURGERY    . INJECTION OF SILICONE OIL  9/70/2637   Procedure: Injection Of Silicone Oil;  Surgeon: Bernarda Caffey, MD;  Location: [place];  Service: Ophthalmology;;  . MEMBRANE PEEL Right 02/24/2019   Procedure: Antoine Primas;  Surgeon: Bernarda Caffey, MD;  Location: [place];  Service: Ophthalmology;  Laterality: Right;  . PHOTOCOAGULATION WITH LASER Right 02/24/2019   Procedure: Photocoagulation With Laser;  Surgeon: Bernarda Caffey, MD;  Location: [place];  Service: Ophthalmology;  Laterality: Right;  . STERNAL WOUND DEBRIDEMENT Left 08/27/2018   Procedure: Incision and DEBRIDEMENT Left Chest, Neck and Mediastinum;  Surgeon: Gaye Pollack, MD;  Location: [facility];  Service: Thoracic;  Laterality: Left;  . STERNAL WOUND DEBRIDEMENT Left 08/30/2018   Procedure: WOUND VAC CHANGE, LEFT CHEST AND NECK, POSSIBLE DEBRIDEMENT;  Surgeon: Gaye Pollack, MD;  Location: [place];  Service: Thoracic;  Laterality: Left;    There were no vitals filed for this visit.  Subjective Assessment - 03/25/19 1418    Subjective  I am having surgery on my otehr eye on the 20th of next week. I felt good after last session. I was  sore. My soreness used to be in my quads more and now its more in my cheek muscles. The stretches help a lot in managing my pain.    Pertinent History  Randy Oneal presented to [facility] on 08/17/18 with signs of sepsis. He was transferred to [facility] where he underwent surgery on 08/27/18 for Lt sternoclavicular infection with abscess to Lt neck and mediastinum. The wound was debrided and wound vac changed again on 08/30/18. Pt was discharged on 09/05/18 with home care to manage wound vac and IV line. On 01/19/19 patient had his final follow up with his surgeon, Gaye Pollack, MD, and was discharged as wound was fully healed and no signs of infection were present any longer. He is  to follow up with his PCP no, Sallee Lange, MD.    Currently in Pain?  Yes    Pain Score  2     Pain Location  Buttocks    Pain Orientation  Right;Left    Pain Descriptors / Indicators  Sore    Pain Type  Acute pain    Pain Radiating Towards  none                       [facility] Adult PT Treatment/Exercise - 03/25/19 0001      Knee/Hip Exercises: Standing   Forward Step Up  Both;1 set;10 reps;Hand Hold: 1;Step Height: 6"    Step Down  Both;2 sets;5 reps;Hand Hold: 2;Step Height: 4"    Other Standing Knee Exercises  hip hikes with and without swing 1x10 B       Knee/Hip Exercises: Seated   Long Arc Quad  Strengthening;Both;1 set;10 reps    Long Arc Quad Limitations  red TB     Sit to [employer]  10 reps;with UE support   no UEs first 5, did use UEs second 5 due to fatigue     Knee/Hip Exercises: Supine   Bridges  Both;2 sets;15 reps    Bridges Limitations  2 second holds     Straight Leg Raises  Both;1 set;15 reps    Straight Leg Raises Limitations  --          Balance Exercises - 03/25/19 1457      Balance Exercises: Standing   Partial Tandem Stance  Eyes open;Intermittent upper extremity support;2 reps;15 secs        PT Education - 03/25/19 1505    Education Details  hold PT sessions next week until after surgery; return to PT 04/07/19. Exercise form and purpose, likely DOMS and management strategies    Person(s) Educated  Patient    Methods  Explanation    Comprehension  Verbalized understanding       PT Short Term Goals - 03/11/19 1638      PT SHORT TERM GOAL #1   Title  Patient will be independent with HEP, updated PRN, to improve functional strength to improve mobility and return to PLOF.    Baseline  7/31- going well    Time  2    Period  Weeks    Status  Achieved      PT SHORT TERM GOAL #2   Title  Patient will improve LE strength by 1 grade with MMT to indicate significant improvement in strength.    Baseline  7/31- flowsheet    Time  4     Period  Weeks    Status  On-going      PT SHORT TERM GOAL #3  Title  Patient will perform SLS for 10 seconds on bil LE to indicate improved glut med strangth and improved balance for stair mobility and gait.    Baseline  7/31- 3 seconds at best    Time  4      PT SHORT TERM GOAL #4   Title  Patient will ambulate at 0.8 m/s or faster with LRAD during 2 MWT to indicate improved endurance and gait.    Baseline  7/31- 2MWT 241ft    Time  4    Period  Weeks    Status  On-going        PT Long Term Goals - 03/11/19 1640      PT LONG TERM GOAL #1   Time  8    Period  Weeks    Status  On-going      PT LONG TERM GOAL #2   Title  Patient will ascend/descend 4 6" stairs with no hand rail at independent level with no overt LOB.    Time  8    Period  Weeks    Status  On-going      PT LONG TERM GOAL #3   Title  Patient will report no low back or posterior leg pain for 2 week period that is above 3/10 indicating improved activity tolerance.    Time  8    Period  Weeks    Status  On-going            Plan - 03/25/19 1506    Clinical Impression Statement  Randy Oneal arrives today in great spirits and reports that he is having surgery on his other eye next week; he will have to be COVID-tested per that clinic's pre-surgical protocol and will likely have the surgery next Thursday. Continued working on functional strengthening, conditioning, and balance training this session within restrictions as set by eye surgeon (no bending forward, no weight over 15#, no weight machines until further cleared). He is in agreement to hold therapy sessions next week to allow for testing and his surgery, plan to resume sessions 04/07/19- schedule adjusted as appropriate. Cues provided throughout session to avoid bending over during functional tasks as per eye precautions.    Personal Factors and Comorbidities  Fitness;Comorbidity 2    Comorbidities  DM, HTN, recent sepsis with prolonged hospitalization and  weight loss    Examination-Activity Limitations  Bathing;Stairs;Stand;Lift;Locomotion Level    Examination-Participation Restrictions  Laundry;Yard Work;Driving;Community Activity;Cleaning    Stability/Clinical Decision Making  Stable/Uncomplicated    Clinical Decision Making  Low    Rehab Potential  Good    PT Frequency  2x / week    PT Duration  6 weeks    PT Treatment/Interventions  ADLs/Self Care Home Management;Aquatic Therapy;Cryotherapy;Electrical Stimulation;Moist Heat;Gait training;DME Instruction;Stair training;Functional mobility training;Therapeutic activities;Therapeutic exercise;Balance training;Neuromuscular re-education;Patient/family education;Manual techniques;Passive range of motion;Dry needling;Taping    PT Next Visit Plan  Give HEP update upon return 04/07/19 (out for surgery week of 03/28/19). Pt with MD on Monday.  Following call on 03/18/19 from Dr Dairl Ponder office no bending forward and weight limit at 15#.  Continue functional strengthening and balance training.  No machine for strengthening until cleared by MD.    PT Home Exercise Plan  Eval: clamshell with band, seated hamstring stretch  6/16:  standing heelraises, toeraises; 02/08/19: standing hip abd/ext infront of sink/counter with RTB around thigh    Consulted and Agree with Plan of Care  Patient       Patient will benefit  from skilled therapeutic intervention in order to improve the following deficits and impairments:  Abnormal gait, Decreased activity tolerance, Decreased balance, Decreased mobility, Difficulty walking, Decreased endurance, Decreased coordination, Decreased strength, Pain, Postural dysfunction, Impaired flexibility  Visit Diagnosis: 1. Muscle weakness (generalized)   2. Difficulty in walking, not elsewhere classified        Problem List Patient Active Problem List   Diagnosis Date Noted  . Diabetic retinopathy of both eyes ([place]) 01/31/2019  . Type 2 diabetes mellitus with hyperglycemia, with  long-term current use of insulin ([place]) 10/20/2018  . Sepsis due to Streptococcus, group B ([place]) 10/05/2018  . Dietary counseling and surveillance 09/15/2018  . Non compliance with medical treatment 09/15/2018  . Open wound of right great toe   . Septic arthritis of left sternoclavicular joint ([place])   . Mediastinal abscess ([place]) 08/27/2018  . Cellulitis of great toe, right   . Diabetic hyperosmolar non-ketotic state ([place]) 08/17/2018  . Hyperglycemia 08/17/2018  . AKI (acute kidney injury) ([place]) 08/16/2018  . Hyponatremia 08/16/2018  . Hyperlipidemia associated with type 2 diabetes mellitus ([place]) 03/04/2017  . Erectile dysfunction 03/04/2017  . Personal history of noncompliance with medical treatment, presenting hazards to health 11/22/2015  . HTN (hypertension), benign 01/30/2014  . Loss of weight 01/30/2014  . Type 2 diabetes mellitus not at goal [facility]) 03/30/2013    Deniece Ree PT, DPT, CBIS  Supplemental Physical Therapist [facility]    Pager [phone] Acute Rehab Office [place] [address] [place], [place], [address] Phone: [phone]   Fax:  [phone]  Name: Randy Oneal MRN: [id number] Date of Birth: 08/09/1966

## 2019-03-28 ENCOUNTER — Other Ambulatory Visit (HOSPITAL_COMMUNITY)
Admission: RE | Admit: 2019-03-28 | Discharge: 2019-03-28 | Disposition: A | Discharge status: Home / Self Care | Payer: Managed Care, Other (non HMO) | Source: Ambulatory Visit | Attending: Ophthalmology | Admitting: Ophthalmology

## 2019-03-28 DIAGNOSIS — Z01812 Encounter for preprocedural laboratory examination: Secondary | ICD-10-CM | POA: Insufficient documentation

## 2019-03-28 DIAGNOSIS — Z20828 Contact with and (suspected) exposure to other viral communicable diseases: Secondary | ICD-10-CM | POA: Insufficient documentation

## 2019-03-28 LAB — SARS CORONAVIRUS 2 (TAT 6-24 HRS): SARS Coronavirus 2: NEGATIVE

## 2019-03-28 NOTE — H&P (Signed)
Randy Oneal is an 53 y.o. male.    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 Right eye has mild post-op subconjunctival silicon oil. Left eye has a focal, combined rhegmatogenous + tractional retinal detachment secondary to proliferative diabetic retinopathy. After discussion of risk benefits and alteratives of procedure, pt elects to proceed with surgical repair of retinal detachment, left eye and surgical removal of subconjunctival silicon oil, right eye, under general anesthesia.   Past Medical History:  Diagnosis Date  . Diabetes mellitus    Insulin dependent  . Diabetic retinopathy of both eyes ([place]) 01/31/2019   Dr Coralyn Pear 01/2019  . Hypertension   . Renal insufficiency   . Sepsis due to Streptococcus, group B ([place]) 10/05/2018    Past Surgical History:  Procedure Laterality Date  . [place] VITRECTOMY WITH 20 GAUGE MVR PORT FOR MACULAR HOLE Right 02/24/2019   Procedure: 25 GAUGE PARS PLANA VITRECTOMY WITH 20 GAUGE MVR PORT FOR MACULAR HOLE;  Surgeon: Bernarda Caffey, MD;  Location: [place];  Service: Ophthalmology;  Laterality: Right;  . APPLICATION OF WOUND VAC Left 08/27/2018   Procedure: APPLICATION OF WOUND VAC, left neck;  Surgeon: Gaye Pollack, MD;  Location: [place];  Service: Thoracic;  Laterality: Left;  . APPLICATION OF WOUND VAC Left 08/30/2018   Procedure: APPLICATION OF WOUND VAC;  Surgeon: Gaye Pollack, MD;  Location: [place];  Service: Thoracic;  Laterality: Left;  . CATARACT EXTRACTION W/ INTRAOCULAR LENS  IMPLANT, BILATERAL    . COLONOSCOPY  06/24/2011   Procedure: COLONOSCOPY;  Surgeon: [name], MD;  [facility];  Service: Endoscopy;  Laterality: N/A;  8:30 AM  . EYE SURGERY    . INJECTION OF SILICONE OIL  1/49/7026   Procedure: Injection Of Silicone Oil;  Surgeon: Bernarda Caffey, MD;  Location: [place];   Service: Ophthalmology;;  . MEMBRANE PEEL Right 02/24/2019   Procedure: Antoine Primas;  Surgeon: Bernarda Caffey, MD;  Location: [place];  Service: Ophthalmology;  Laterality: Right;  . PHOTOCOAGULATION WITH LASER Right 02/24/2019   Procedure: Photocoagulation With Laser;  Surgeon: Bernarda Caffey, MD;  Location: [place];  Service: Ophthalmology;  Laterality: Right;  . STERNAL WOUND DEBRIDEMENT Left 08/27/2018   Procedure: Incision and DEBRIDEMENT Left Chest, Neck and Mediastinum;  Surgeon: Gaye Pollack, MD;  Location: [facility] OR;  Service: Thoracic;  Laterality: Left;  . STERNAL WOUND DEBRIDEMENT Left 08/30/2018   Procedure: WOUND VAC CHANGE, LEFT CHEST AND NECK, POSSIBLE DEBRIDEMENT;  Surgeon: Gaye Pollack, MD;  [facility];  Service: Thoracic;  Laterality: Left;    Family History  Problem Relation Age of Onset  . Hypertension Mother   . Colon cancer Neg Hx    Social History:  reports that he has never smoked. He has never used smokeless tobacco. He reports that he does not drink alcohol or use drugs.  Allergies:  Allergies  Allergen Reactions  . Tramadol Nausea And Vomiting    Nausea and vomiting    No medications prior to admission.    Review of systems otherwise negative  There were no vitals taken for this visit.  Physical exam: Mental status: oriented x3. Eyes: See eye exam associated with this date of surgery Ears, Nose, Throat: within normal limits Neck: Within Normal limits General: within normal limits Chest: Within normal limits Breast: deferred Heart: Within normal  limits Abdomen: Within normal limits GU: deferred Extremities: within normal limits Skin: within normal limits  Assessment/Plan 1. 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 Subconjunctival silicon oil, RIGHT EYE  Plan: To [facility] for 25g PPV + endolaser and gas vs oil LEFT EYE; removal of subconjunctival silicon oil, RIGHT  EYE  Gardiner Sleeper, M.D., Ph.D. Vitreoretinal Surgeon [facility]

## 2019-03-29 ENCOUNTER — Ambulatory Visit (HOSPITAL_COMMUNITY): Payer: Managed Care, Other (non HMO)

## 2019-03-30 ENCOUNTER — Encounter (HOSPITAL_COMMUNITY): Payer: Self-pay | Admitting: *Deleted

## 2019-03-30 NOTE — Progress Notes (Signed)
Unable to contact patient after multiple attempts.  Tried to leave VM with instructions for DOS but the VM reached it's limit and I was unable to finish giving instructions.  Called patient's wife [name] [phone] and gave her instructions for DOS for her husband's surgery tomorrow.  PCP - Dr Sallee Lange Cardiologist - Denies Endocrinologist-Dr Melanie Crazier [facility]  Chest x-ray - Denies EKG - 02/15/19 Stress Test - Denies ECHO - 08/18/18 Cardiac Cath - Denies  Wife [name] does not know if patient checks his blood sugar.      THE NIGHT BEFORE SURGERY, take 11 units of Tresiba Insulin.   . On DOS, If your CBG is greater than 220 mg/dL, you may take  of your sliding scale (correction) dose of Humalog insulin.  o Check your blood sugar the morning of your surgery when you wake up and every 2 hours until you get to the Short Stay unit. . If your blood sugar is less than 70 mg/dL, you will need to treat for low blood sugar: o Do not take insulin. o Treat a low blood sugar (less than 70 mg/dL) with  cup of clear juice (cranberry or apple), 4 glucose tablets, OR glucose gel. o Recheck blood sugar in 15 minutes after treatment (to make sure it is greater than 70 mg/dL). If your blood sugar is not greater than 70 mg/dL on recheck, call [phone] for further instructions. . Report your blood sugar to the short stay nurse when you get to Short Stay.  Anesthesia review: [place], [name]  STOP now taking any Aspirin (unless otherwise instructed by your surgeon), Aleve, Naproxen, Ibuprofen, Motrin, Advil, Goody's, BC's, all herbal medications, fish oil, and all vitamins.   Patient's covid test was negative on 03/28/19.  Wife [name] states she does not have any covid symptoms and has not traveled outside [place] in the last 14 days.

## 2019-03-30 NOTE — Anesthesia Preprocedure Evaluation (Addendum)
Anesthesia Evaluation  Patient identified by MRN, date of birth, ID band Patient awake    Reviewed: Allergy & Precautions, NPO status , Patient's Chart, lab work & pertinent test results  Airway Mallampati: II  TM Distance: >3 FB Neck ROM: Full    Dental  (+) Teeth Intact, Dental Advisory Given   Pulmonary neg pulmonary ROS,    breath sounds clear to auscultation       Cardiovascular hypertension,  Rhythm:Regular Rate:Normal     Neuro/Psych negative neurological ROS  negative psych ROS   GI/Hepatic negative GI ROS, Neg liver ROS,   Endo/Other  diabetes  Renal/GU CRFRenal disease  negative genitourinary   Musculoskeletal  (+) Arthritis ,   Abdominal Normal abdominal exam  (+)   Peds  Hematology negative hematology ROS (+)   Anesthesia Other Findings   Reproductive/Obstetrics                           Anesthesia Physical Anesthesia Plan  ASA: III  Anesthesia Plan: General   Post-op Pain Management:    Induction: Intravenous  PONV Risk Score and Plan: 3 and Ondansetron, Dexamethasone and Midazolam  Airway Management Planned: Oral ETT  Additional Equipment: None  Intra-op Plan:   Post-operative Plan: Extubation in OR  Informed Consent: I have reviewed the patients History and Physical, chart, labs and discussed the procedure including the risks, benefits and alternatives for the proposed anesthesia with the patient or authorized representative who has indicated his/her understanding and acceptance.     Dental advisory given  Plan Discussed with: CRNA  Anesthesia Plan Comments: (See PAT note written 03/30/2019 by Myra Gianotti, PA-C. Known CKD--pending nephrology evaluation next month. )      Anesthesia Quick Evaluation

## 2019-03-30 NOTE — Progress Notes (Signed)
[facility] Note  04/01/2019     CHIEF COMPLAINT Patient presents for Post-op Follow-up   HISTORY OF PRESENT ILLNESS: Randy Oneal is a 53 y.o. male who presents to the clinic today for:   HPI    Post-op Follow-up    In left eye.  Discomfort includes foreign body sensation.  Vision is blurred at distance.  I, the attending physician,  performed the HPI with the patient and updated documentation appropriately.          Comments    53 y/o male pt here for 1 day po s/p PPV for TRD OS on 08.20.20.  Had minor pain OS last night, but has since subsided.  Minor fbs OS today.  Patch removed in office.  VA OS slightly better than pt thought it would be.  Denies pain, flashes, floaters.  No change in [place] OD.  PF QID OD Cosopt QD OD Zymaxid QID OD Atropine Bid OD PSO ung QHS OD       Last edited by Bernarda Caffey, MD on 04/03/2019  8:32 PM. (History)    Pt states his eyes feel okay this morning  Referring physician: [name], MD [place],  [place] [address]  HISTORICAL INFORMATION:   Selected notes from the MEDICAL RECORD NUMBER Referred by Dr.Mark Gershon Crane for concern of decreased vision post cataract sx LEE:  Ocular Hx- PMH-   CURRENT MEDICATIONS: Current Outpatient Medications (Ophthalmic Drugs)  Medication Sig  . bacitracin-polymyxin b (POLYSPORIN) ophthalmic ointment Place 1 application into the right eye at bedtime.   . dorzolamide-timolol (COSOPT) 22.3-6.8 MG/ML ophthalmic solution Place 1 drop into the right eye daily.   . prednisoLONE acetate (PRED FORTE) 1 % ophthalmic suspension Place 1 drop into the right eye 4 (four) times daily. (Patient not taking: Reported on 03/29/2019)   No current facility-administered medications for this visit.  (Ophthalmic Drugs)   Current Outpatient Medications (Other)  Medication Sig  . acetaminophen (TYLENOL) 500 MG tablet Take 1,000 mg by mouth every 6 (six) hours as needed  for moderate pain or headache.  Marland Kitchen amLODipine (NORVASC) 10 MG tablet Take one tablet by mouth each day (Patient taking differently: Take 10 mg by mouth daily. )  . atorvastatin (LIPITOR) 10 MG tablet Take 1 tablet (10 mg total) by mouth daily at 6 PM. (Patient taking differently: Take 10 mg by mouth daily. )  . Continuous Blood Gluc Sensor (FREESTYLE LIBRE SENSOR SYSTEM) MISC Use one sensor every 10 days.  . famotidine (PEPCID) 40 MG tablet Take one tablet by mouth daily (Patient taking differently: Take 40 mg by mouth daily as needed for heartburn. )  . glucose blood (BAYER CONTOUR NEXT TEST) test strip USE TO TEST FOUR TIMES DAILY.  Marland Kitchen insulin lispro (HUMALOG KWIKPEN) 100 UNIT/ML KwikPen Inject 0.06 mLs (6 Units total) into the skin daily with breakfast AND 0.07 mLs (7 Units total) 2 (two) times daily before lunch and supper. (Patient taking differently: inject 5-9 unit with each meal)  . Insulin Pen Needle (BD PEN NEEDLE NANO 2ND GEN) 32G X 4 MM MISC Four times daily  . metoprolol tartrate (LOPRESSOR) 50 MG tablet Take one half tablet by mouth twice daily (Patient taking differently: Take 25 mg by mouth 2 (two) times daily. )  . MICROLET LANCETS MISC USE FOUR TIMES A DAY AS DIRECTED.  Marland Kitchen nystatin (MYCOSTATIN/NYSTOP) powder Apply topically 2 (two) times daily. To groin and scrotum (Patient taking  differently: Apply 1 g topically 2 (two) times daily as needed (rash). To groin and scrotum)  . pantoprazole (PROTONIX) 40 MG tablet Take 1 tablet (40 mg total) by mouth daily. (Patient not taking: Reported on 03/29/2019)  . sildenafil (REVATIO) 20 MG tablet May take up to 5 before relations as directed (Patient not taking: Reported on 03/29/2019)  . Simethicone (GAS-X PO) Take 1 tablet by mouth daily as needed (gas).  . TRESIBA FLEXTOUCH 100 UNIT/ML SOPN FlexTouch Pen Inject 0.19 mLs (19 Units total) into the skin at bedtime. (Patient taking differently: Inject 22 Units into the skin at bedtime. )   No current  facility-administered medications for this visit.  (Other)      REVIEW OF SYSTEMS: ROS    Positive for: Endocrine, Eyes   Negative for: Constitutional, Gastrointestinal, Neurological, Skin, Genitourinary, Musculoskeletal, HENT, Cardiovascular, Respiratory, Psychiatric, Allergic/Imm, Heme/Lymph   Last edited by Matthew Folks, COA on 04/01/2019  9:44 AM. (History)       ALLERGIES Allergies  Allergen Reactions  . Tramadol Nausea And Vomiting    PAST MEDICAL HISTORY Past Medical History:  Diagnosis Date  . Diabetes mellitus    Type 2   . Diabetic retinopathy of both eyes ([place]) 01/31/2019   Dr Coralyn Pear 01/2019  . Hypertension   . Renal insufficiency    ckd stage 4  . Retinal detachment    OS  . Sepsis due to Streptococcus, group B ([place]) 10/05/2018   Past Surgical History:  Procedure Laterality Date  . [place] VITRECTOMY WITH 20 GAUGE MVR PORT FOR MACULAR HOLE Right 02/24/2019   Procedure: 25 GAUGE PARS PLANA VITRECTOMY WITH 20 GAUGE MVR PORT FOR MACULAR HOLE;  Surgeon: Bernarda Caffey, MD;  Location: [place];  Service: Ophthalmology;  Laterality: Right;  . APPLICATION OF WOUND VAC Left 08/27/2018   Procedure: APPLICATION OF WOUND VAC, left neck;  Surgeon: Gaye Pollack, MD;  Location: [place];  Service: Thoracic;  Laterality: Left;  . APPLICATION OF WOUND VAC Left 08/30/2018   Procedure: APPLICATION OF WOUND VAC;  Surgeon: Gaye Pollack, MD;  Location: [place];  Service: Thoracic;  Laterality: Left;  . CATARACT EXTRACTION Bilateral   . CATARACT EXTRACTION W/ INTRAOCULAR LENS  IMPLANT, BILATERAL    . COLONOSCOPY  06/24/2011   Procedure: COLONOSCOPY;  Surgeon: [name], MD;  [facility];  Service: Endoscopy;  Laterality: N/A;  8:30 AM  . EYE SURGERY    . GAS/FLUID EXCHANGE Left 03/31/2019   Procedure: Gas/Fluid Exchange;  Surgeon: Bernarda Caffey, MD;  Location: [place];  Service: Ophthalmology;  Laterality: Left;  . INJECTION OF SILICONE OIL  5/46/5681    Procedure: Injection Of Silicone Oil;  Surgeon: Bernarda Caffey, MD;  Location: [place];  Service: Ophthalmology;;  . MEMBRANE PEEL Right 02/24/2019   Procedure: Antoine Primas;  Surgeon: Bernarda Caffey, MD;  Location: [place];  Service: Ophthalmology;  Laterality: Right;  . PARS PLANA VITRECTOMY Left 03/31/2019   Procedure: PARS PLANA VITRECTOMY WITH 25 GAUGE WITH MEMBRANE PEEL;  Surgeon: Bernarda Caffey, MD;  Location: [place];  Service: Ophthalmology;  Laterality: Left;  . PHOTOCOAGULATION WITH LASER Right 02/24/2019   Procedure: Photocoagulation With Laser;  Surgeon: Bernarda Caffey, MD;  Location: [place];  Service: Ophthalmology;  Laterality: Right;  . PHOTOCOAGULATION WITH LASER Left 03/31/2019   Procedure: Photocoagulation With Laser;  Surgeon: Bernarda Caffey, MD;  Location: [place];  Service: Ophthalmology;  Laterality: Left;  . RETINAL DETACHMENT SURGERY Left  03/31/2019   TRD Repair - Dr. Bernarda Caffey  . SILICON OIL REMOVAL Right 11/11/4740   Procedure: Silicon Oil Removal;  Surgeon: Bernarda Caffey, MD;  Location: [place];  Service: Ophthalmology;  Laterality: Right;  . STERNAL WOUND DEBRIDEMENT Left 08/27/2018   Procedure: Incision and DEBRIDEMENT Left Chest, Neck and Mediastinum;  Surgeon: Gaye Pollack, MD;  [facility];  Service: Thoracic;  Laterality: Left;  . STERNAL WOUND DEBRIDEMENT Left 08/30/2018   Procedure: WOUND VAC CHANGE, LEFT CHEST AND NECK, POSSIBLE DEBRIDEMENT;  Surgeon: Gaye Pollack, MD;  [facility];  Service: Thoracic;  Laterality: Left;    FAMILY HISTORY Family History  Problem Relation Age of Onset  . Hypertension Mother   . Colon cancer Neg Hx     SOCIAL HISTORY Social History   Tobacco Use  . Smoking status: Never Smoker  . Smokeless tobacco: Never Used  Substance Use Topics  . Alcohol use: No  . Drug use: No         OPHTHALMIC EXAM:  Base Eye Exam    Visual Acuity (Snellen - Linear)      Right Left   Dist [place] CF @ face 20/300   Dist ph [place] NI 20/200 +        Tonometry (Tonopen, 9:48 AM)      Right Left   Pressure 12 18       Pupils      Dark Light Shape React APD   Right 4 3 Round Minimal None   Left 5 5 Round Minimal None       Neuro/Psych    Oriented x3: Yes   Mood/Affect: Normal       Dilation    Left eye: 1.0% Mydriacyl, 2.5% Phenylephrine @ 9:48 AM        Slit Lamp and Fundus Exam    Slit Lamp Exam      Right Left   Lids/Lashes mild Meibomian gland dysfunction mild Meibomian gland dysfunction   Conjunctiva/Sclera Improvement in subconj silicon oil, sutures intact, Subconjunctival hemorrhage, 1+ Injection Subconjunctival hemorrhage temporally, sutures intact   Cornea 1+ PEE mild Arcus, 2+ Descemet's folds, Well healed temporal cataract wounds   Anterior Chamber Deep, clear Deep, 3-4+pigment   Iris slightly irregular, dilated, posterior synchiae @ 2:00, 3:00-7:30 and 10:30 Round and dilated   Lens PC IOL in good position, 1+ PCO PC IOL in good position   Vitreous Post vit, silicon oil bubble ~59%. Vitreous syneresis, +cell/pigment in anterior vitreous, vitreous hemorrhage clearing and settling inferiorly, Posterior vitreous detachment, vitreous condensations       Fundus Exam      Right Left   Disc  pink and sharp, improved fibrosis   C/D Ratio 0.4 0.4   Macula  Flat under oil, reattachment of focal RD with good laser around hole, fibrosis vastly improved   Vessels  attenuated; fibrosis along IT arcades improved   Periphery  Attached, good 360 PRP laser changes          IMAGING AND PROCEDURES  Imaging and Procedures for @TODAY @           ASSESSMENT/PLAN:    ICD-10-CM   1. Proliferative diabetic retinopathy of both eyes with macular edema associated with type 2 diabetes mellitus ([place])  D63.8756   2. 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 Vitreous hemorrhage of both eyes ([place])  H43.13   5. Retinal edema  H35.81  6. Essential hypertension   I10   7. Hypertensive retinopathy of both eyes  H35.033   8. Pseudophakia of both eyes  Z96.1     1-4. Proliferative diabetic retinopathy with DME, TRD, and vitreous hemorrhage OU (OD > OS)  - pt with complex medical history with hospitalization in Jan-Feb 2020 for bacteremia and abscess  - history of poor glycemic control for years  - s/p IVA OU #1 (06.02.20), #2 (07.01.20)  - s/p PRP OS (06.10.20)  - BCVA OD CF; OS 20/100  - exam shows diffuse vitreous and preretinal hemorrhage + scattered fibrosis OU   - pre-op clearing of VH OD has improved visualization of posterior pole --+macular TRD OD; and ?regressing NVD OS  - pre-op OCT shows preretinal fibrosis w/ traction OU (OD > OS)  - s/p PPV/PFC/EL/FAX/silicone oil OD, 09.98.33  - s/p subconj silicon oil removal OD 8.20.20  - now POD1 s/p PPV/EL/FAX/silicone oil OS, 82.50.53             - doing well   - subconj silicon oil prolapse OD -- improved today             - OD: improved fibrosis, retina attached and in good position under oil, mild residual SRF inferiorly  - OS: improved fibrosis, focal RD reattached under oil             - IOP 12, 18             - cont PF to 4x/day OU                         brimonidine BID OU                           zymaxid QID OU                         Atropine BID OS only                         PSO ung QID OU             - cont face down positioning full time for 3 days, then 50% of time starting Monday; avoid laying flat on back             - eye shield OS when sleepingx1 more week             - post op drop and positioning instructions reviewed             - tylenol/ibuprofen for pain  - f/u 1 week  5,6. Hypertensive retinopathy OU  - discussed importance of tight BP control  - monitor  7. Pseudophakia OU  - s/p CE/IOL OU (Dr. Gershon Crane)   Ophthalmic Meds Ordered this visit:  No orders of the defined types were placed in this encounter.      Return in about 1 week (around  04/08/2019) for POV.  There are no Patient Instructions on file for this visit.   Explained the diagnoses, plan, and follow up with the patient and they expressed understanding.  Patient expressed understanding of the importance of proper follow up care.   This document serves as a record of services personally performed by Gardiner Sleeper, MD, PhD. It was created on their behalf by Roselee Nova, COMT. The creation of this record is  the provider's dictation and/or activities during the visit.  Electronically signed by: Roselee Nova, COMT 04/03/19 8:41 PM   Gardiner Sleeper, M.D., Ph.D. Diseases & Surgery of the Retina and Vitreous [facility]  I have reviewed the above documentation for accuracy and completeness, and I agree with the above. Gardiner Sleeper, M.D., Ph.D. 04/03/19 8:41 PM    Abbreviations: M myopia (nearsighted); A astigmatism; H hyperopia (farsighted); P presbyopia; Mrx spectacle prescription;  CTL contact lenses; OD right eye; OS left eye; OU both eyes  XT exotropia; ET esotropia; PEK punctate epithelial keratitis; PEE punctate epithelial erosions; DES dry eye syndrome; MGD meibomian gland dysfunction; ATs artificial tears; PFAT's preservative free artificial tears; [place] nuclear sclerotic cataract; PSC posterior subcapsular cataract; ERM epi-retinal membrane; PVD posterior vitreous detachment; RD retinal detachment; DM diabetes mellitus; DR diabetic retinopathy; NPDR non-proliferative diabetic retinopathy; PDR proliferative diabetic retinopathy; CSME clinically significant macular edema; DME diabetic macular edema; dbh dot blot hemorrhages; CWS cotton wool spot; POAG primary open angle glaucoma; C/D cup-to-disc ratio; HVF humphrey visual field; GVF goldmann visual field; OCT optical coherence tomography; IOP intraocular pressure; BRVO Branch retinal vein occlusion; CRVO central retinal vein occlusion; CRAO central retinal artery occlusion; BRAO branch retinal  artery occlusion; RT retinal tear; SB scleral buckle; PPV pars plana vitrectomy; VH Vitreous hemorrhage; PRP panretinal laser photocoagulation; IVK intravitreal kenalog; VMT vitreomacular traction; MH Macular hole;  NVD neovascularization of the disc; NVE neovascularization elsewhere; AREDS age related eye disease study; ARMD age related macular degeneration; POAG primary open angle glaucoma; EBMD epithelial/anterior basement membrane dystrophy; ACIOL anterior chamber intraocular lens; IOL intraocular lens; PCIOL posterior chamber intraocular lens; Phaco/IOL phacoemulsification with intraocular lens placement; [place] photorefractive keratectomy; LASIK laser assisted in situ keratomileusis; HTN hypertension; DM diabetes mellitus; COPD chronic obstructive pulmonary disease

## 2019-03-30 NOTE — Progress Notes (Signed)
Anesthesia Chart Review: SAME DAY WORK-UP   Case: [id number] Date/Time: 03/31/19 1015   Procedure: PARS PLANA VITRECTOMY WITH 25 GAUGE WITH MEMBRANE PEEL (Left )   Anesthesia type: General   Pre-op diagnosis: left eye retinal detachment   Location: [facility] OR ROOM 08 / [place] OR   Surgeon: Bernarda Caffey, MD      DISCUSSION:  Patient is a 53 year old scheduled for the above procedure. He underwent right eye pars plana vitrectomy on 02/24/19. Of note, prior to that surgery he required primary care evaluation for abnormal labs showing Cr 1.63 to 3.31 over the previous three months (possibly related to DM or renal injury for recent antibiotics for sternal wound). Dr. Wolfgang Phoenix did initiate a referral to nephrology (scheduled to be seen 04/25/19), but ultimately patient was felt okay to proceed then with repeat Cr 2.78, 2.98.   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 - Admission 08/16/18-09/05/18 with diabetic hyperosmolar non-ketotic state, hyponatremia, AKI. He was also noted to have right great toe cellulitis and plantar pustule on his left foot. No drainable abscess per general surgery and was treated with IV antibiotics for sepsis. Blood cultures grew group B Strep. He later developed swelling and tenderness over his left neck and sternoclavicular region with CT showing abscess. He was transferred from [facility] to [facility] and seen by ENT Leta Baptist, MD, CT surgery Gilford Raid, MD, and ID Scharlene Gloss, MD for septic sternoclavicular arthritis. S/p I&D of left sternoclavicular joint, left neck, and mediastinum 08/27/18. Discharged home 09/05/18 with IV antibiotic via RUE PICC line and HHRN for sternal wound VAC changes.   He is a same day work-up. Last labs were at the time of her previous surgery a month ago. He will get labs on arrival and anesthesia team evaluation--definitive plan to be determined at that time. Negative SARS  CoV2 test on 03/28/19.    PROVIDERS: [name], MD is PCP - Collier Flowers, MD is endocrinologist. Last visit 11/17/18. Three month follow-up. Gilford Raid, MD is CT surgeon. Last visit 01/19/19. As needed follow-up recommended as drainage sinus had completely closed from his sternal wound without active signs of infection. - He is scheduled to be evaluated at [facility] (as new patient) on 04/25/19.   LABS: For day of surgery.   EKG: 02/15/19: NSR   CV: Echo 08/18/18: Study Conclusions - Left ventricle: The cavity size was normal. There was mild concentric hypertrophy. Systolic function was vigorous. The estimated ejection fraction was in the range of 65% to 70%. Wall motion was normal; there were no regional wall motion abnormalities. Left ventricular diastolic function parameters were normal.   Past Medical History:  Diagnosis Date  . Diabetes mellitus    Insulin dependent  . Diabetic retinopathy of both eyes ([place]) 01/31/2019   Dr Coralyn Pear 01/2019  . Hypertension   . Renal insufficiency   . Sepsis due to Streptococcus, group B ([place]) 10/05/2018    Past Surgical History:  Procedure Laterality Date  . [place] VITRECTOMY WITH 20 GAUGE MVR PORT FOR MACULAR HOLE Right 02/24/2019   Procedure: 25 GAUGE PARS PLANA VITRECTOMY WITH 20 GAUGE MVR PORT FOR MACULAR HOLE;  Surgeon: Bernarda Caffey, MD;  Location: [place];  Service: Ophthalmology;  Laterality: Right;  . APPLICATION OF WOUND VAC Left 08/27/2018   Procedure: APPLICATION OF WOUND VAC, left neck;  Surgeon: Gaye Pollack, MD;  Location: [facility]  OR;  Service: Thoracic;  Laterality: Left;  . APPLICATION OF WOUND VAC Left 08/30/2018   Procedure: APPLICATION OF WOUND VAC;  Surgeon: Gaye Pollack, MD;  Location: [place];  Service: Thoracic;  Laterality: Left;  . CATARACT EXTRACTION W/ INTRAOCULAR LENS  IMPLANT, BILATERAL    . COLONOSCOPY  06/24/2011   Procedure: COLONOSCOPY;  Surgeon: [name], MD;  [facility];  Service: Endoscopy;  Laterality: N/A;  8:30 AM  . EYE SURGERY    . INJECTION OF SILICONE OIL  5/46/2703   Procedure: Injection Of Silicone Oil;  Surgeon: Bernarda Caffey, MD;  Location: [place];  Service: Ophthalmology;;  . MEMBRANE PEEL Right 02/24/2019   Procedure: Antoine Primas;  Surgeon: Bernarda Caffey, MD;  Location: [place];  Service: Ophthalmology;  Laterality: Right;  . PHOTOCOAGULATION WITH LASER Right 02/24/2019   Procedure: Photocoagulation With Laser;  Surgeon: Bernarda Caffey, MD;  Location: [place];  Service: Ophthalmology;  Laterality: Right;  . STERNAL WOUND DEBRIDEMENT Left 08/27/2018   Procedure: Incision and DEBRIDEMENT Left Chest, Neck and Mediastinum;  Surgeon: Gaye Pollack, MD;  Location: [facility] OR;  Service: Thoracic;  Laterality: Left;  . STERNAL WOUND DEBRIDEMENT Left 08/30/2018   Procedure: WOUND VAC CHANGE, LEFT CHEST AND NECK, POSSIBLE DEBRIDEMENT;  Surgeon: Gaye Pollack, MD;  [facility];  Service: Thoracic;  Laterality: Left;    MEDICATIONS: No current facility-administered medications for this encounter.    Marland Kitchen acetaminophen (TYLENOL) 500 MG tablet  . amLODipine (NORVASC) 10 MG tablet  . atorvastatin (LIPITOR) 10 MG tablet  . bacitracin-polymyxin b (POLYSPORIN) ophthalmic ointment  . dorzolamide-timolol (COSOPT) 22.3-6.8 MG/ML ophthalmic solution  . famotidine (PEPCID) 40 MG tablet  . insulin lispro (HUMALOG KWIKPEN) 100 UNIT/ML KwikPen  . metoprolol tartrate (LOPRESSOR) 50 MG tablet  . nystatin (MYCOSTATIN/NYSTOP) powder  . Simethicone (GAS-X PO)  . TRESIBA FLEXTOUCH 100 UNIT/ML SOPN FlexTouch Pen  . Continuous Blood Gluc Sensor ([place]) MISC  . glucose blood (BAYER CONTOUR NEXT TEST) test strip  . Insulin Pen Needle (BD PEN NEEDLE NANO 2ND GEN) 32G X 4 MM MISC  . MICROLET LANCETS MISC  . pantoprazole (PROTONIX) 40 MG tablet  . prednisoLONE acetate (PRED FORTE) 1 % ophthalmic suspension  . sildenafil  (REVATIO) 20 MG tablet    Myra Gianotti, PA-C Surgical Short Stay/Anesthesiology [facility] Phone [phone] [facility] Phone [phone] 03/30/2019 4:30 PM

## 2019-03-31 ENCOUNTER — Encounter (HOSPITAL_COMMUNITY): Payer: Managed Care, Other (non HMO) | Admitting: Physical Therapy

## 2019-03-31 ENCOUNTER — Encounter (HOSPITAL_COMMUNITY): Payer: Self-pay

## 2019-03-31 ENCOUNTER — Ambulatory Visit (HOSPITAL_COMMUNITY): Payer: Managed Care, Other (non HMO) | Admitting: Certified Registered Nurse Anesthetist

## 2019-03-31 ENCOUNTER — Encounter (HOSPITAL_COMMUNITY)
Admission: RE | Disposition: A | Discharge status: Home / Self Care | Payer: Self-pay | Source: Home / Self Care | Attending: Ophthalmology

## 2019-03-31 ENCOUNTER — Other Ambulatory Visit: Payer: Self-pay

## 2019-03-31 ENCOUNTER — Ambulatory Visit (HOSPITAL_COMMUNITY)
Admission: RE | Admit: 2019-03-31 | Discharge: 2019-03-31 | Disposition: A | Discharge status: Home / Self Care | Payer: Managed Care, Other (non HMO) | Attending: Ophthalmology | Admitting: Ophthalmology

## 2019-03-31 DIAGNOSIS — I10 Essential (primary) hypertension: Secondary | ICD-10-CM | POA: Insufficient documentation

## 2019-03-31 DIAGNOSIS — E113542 Type 2 diabetes mellitus with proliferative diabetic retinopathy with combined traction retinal detachment and rhegmatogenous retinal detachment, left eye: Secondary | ICD-10-CM | POA: Insufficient documentation

## 2019-03-31 DIAGNOSIS — Z794 Long term (current) use of insulin: Secondary | ICD-10-CM | POA: Insufficient documentation

## 2019-03-31 DIAGNOSIS — Z9841 Cataract extraction status, right eye: Secondary | ICD-10-CM | POA: Diagnosis not present

## 2019-03-31 DIAGNOSIS — Z9842 Cataract extraction status, left eye: Secondary | ICD-10-CM | POA: Diagnosis not present

## 2019-03-31 DIAGNOSIS — Z885 Allergy status to narcotic agent status: Secondary | ICD-10-CM | POA: Diagnosis not present

## 2019-03-31 DIAGNOSIS — Z961 Presence of intraocular lens: Secondary | ICD-10-CM | POA: Insufficient documentation

## 2019-03-31 DIAGNOSIS — H338 Other retinal detachments: Secondary | ICD-10-CM | POA: Diagnosis not present

## 2019-03-31 DIAGNOSIS — T85398A Other mechanical complication of other ocular prosthetic devices, implants and grafts, initial encounter: Secondary | ICD-10-CM | POA: Diagnosis not present

## 2019-03-31 DIAGNOSIS — Z79899 Other long term (current) drug therapy: Secondary | ICD-10-CM | POA: Diagnosis not present

## 2019-03-31 DIAGNOSIS — E113522 Type 2 diabetes mellitus with proliferative diabetic retinopathy with traction retinal detachment involving the macula, left eye: Secondary | ICD-10-CM | POA: Diagnosis not present

## 2019-03-31 HISTORY — PX: SILICON OIL REMOVAL: SHX5305

## 2019-03-31 HISTORY — PX: GAS/FLUID EXCHANGE: SHX5334

## 2019-03-31 HISTORY — PX: PHOTOCOAGULATION WITH LASER: SHX6027

## 2019-03-31 HISTORY — PX: RETINAL DETACHMENT SURGERY: SHX105

## 2019-03-31 HISTORY — PX: PARS PLANA VITRECTOMY: SHX2166

## 2019-03-31 LAB — GLUCOSE, CAPILLARY
Glucose-Capillary: 158 mg/dL — ABNORMAL HIGH (ref 70–99)
Glucose-Capillary: 115 mg/dL — ABNORMAL HIGH (ref 70–99)
Glucose-Capillary: 124 mg/dL — ABNORMAL HIGH (ref 70–99)

## 2019-03-31 LAB — BASIC METABOLIC PANEL
Anion gap: 8 (ref 5–15)
BUN: 49 mg/dL — ABNORMAL HIGH (ref 6–20)
CO2: 21 mmol/L — ABNORMAL LOW (ref 22–32)
Calcium: 8.8 mg/dL — ABNORMAL LOW (ref 8.9–10.3)
Chloride: 111 mmol/L (ref 98–111)
Creatinine, Ser: 3.52 mg/dL — ABNORMAL HIGH (ref 0.61–1.24)
GFR calc Af Amer: 22 mL/min — ABNORMAL LOW (ref >60)
GFR calc non Af Amer: 19 mL/min — ABNORMAL LOW (ref >60)
Glucose, Bld: 155 mg/dL — ABNORMAL HIGH (ref 70–99)
Potassium: 3.9 mmol/L (ref 3.5–5.1)
Sodium: 140 mmol/L (ref 135–145)

## 2019-03-31 LAB — CBC
HCT: 32.5 % — ABNORMAL LOW (ref 39.0–52.0)
Hemoglobin: 10.7 g/dL — ABNORMAL LOW (ref 13.0–17.0)
MCH: 27.1 pg (ref 26.0–34.0)
MCHC: 32.9 g/dL (ref 30.0–36.0)
MCV: 82.3 fL (ref 80.0–100.0)
Platelets: 202 10*3/uL (ref 150–400)
RBC: 3.95 MIL/uL — ABNORMAL LOW (ref 4.22–5.81)
RDW: 13.9 % (ref 11.5–15.5)
WBC: 4.8 10*3/uL (ref 4.0–10.5)
nRBC: 0 % (ref 0.0–0.2)

## 2019-03-31 SURGERY — PARS PLANA VITRECTOMY WITH 25 GAUGE
Anesthesia: General | Site: Eye | Laterality: Right

## 2019-03-31 MED ORDER — GATIFLOXACIN 0.5 % OP SOLN
OPHTHALMIC | Status: AC
  Filled 2019-03-31: qty 2.5

## 2019-03-31 MED ORDER — BUPIVACAINE HCL (PF) 0.75 % IJ SOLN
INTRAMUSCULAR | Status: AC
  Filled 2019-03-31: qty 30

## 2019-03-31 MED ORDER — BEVACIZUMAB CHEMO INJECTION 1.25MG/0.05ML SYRINGE FOR KALEIDOSCOPE
1.2500 mg | Freq: Once | INTRAVITREAL | Status: AC
  Administered 2019-03-31: 1.25 mg via INTRAVITREAL
  Filled 2019-03-31 (×4): qty 0.1

## 2019-03-31 MED ORDER — STERILE WATER FOR INJECTION IJ SOLN
INTRAMUSCULAR | Status: AC
  Filled 2019-03-31: qty 10

## 2019-03-31 MED ORDER — ONDANSETRON HCL 4 MG/2ML IJ SOLN
INTRAMUSCULAR | Status: DC | PRN
  Administered 2019-03-31: 4 mg via INTRAVENOUS

## 2019-03-31 MED ORDER — STERILE WATER FOR INJECTION IJ SOLN
INTRAMUSCULAR | Status: DC | PRN
  Administered 2019-03-31: .5 mL

## 2019-03-31 MED ORDER — ACETAMINOPHEN 160 MG/5ML PO SOLN: 325.0000 mg | Freq: Once | ORAL | Status: DC | PRN

## 2019-03-31 MED ORDER — CEFTAZIDIME 1 G IJ SOLR
INTRAMUSCULAR | Status: AC
  Filled 2019-03-31: qty 1

## 2019-03-31 MED ORDER — INDOCYANINE GREEN 25 MG IV SOLR
INTRAVENOUS | Status: AC
  Filled 2019-03-31: qty 25

## 2019-03-31 MED ORDER — ROCURONIUM BROMIDE 10 MG/ML (PF) SYRINGE
PREFILLED_SYRINGE | INTRAVENOUS | Status: DC | PRN
  Administered 2019-03-31: 60 mg via INTRAVENOUS

## 2019-03-31 MED ORDER — FENTANYL CITRATE (PF) 250 MCG/5ML IJ SOLN
INTRAMUSCULAR | Status: AC
  Filled 2019-03-31: qty 5

## 2019-03-31 MED ORDER — DORZOLAMIDE HCL-TIMOLOL MAL 2-0.5 % OP SOLN
OPHTHALMIC | Status: AC
  Filled 2019-03-31: qty 10

## 2019-03-31 MED ORDER — EPINEPHRINE PF 1 MG/ML IJ SOLN
INTRAMUSCULAR | Status: AC
  Filled 2019-03-31: qty 1

## 2019-03-31 MED ORDER — MIDAZOLAM HCL 2 MG/2ML IJ SOLN
INTRAMUSCULAR | Status: AC
  Filled 2019-03-31: qty 2

## 2019-03-31 MED ORDER — BRIMONIDINE TARTRATE 0.2 % OP SOLN
OPHTHALMIC | Status: DC | PRN
  Administered 2019-03-31 (×2): 1 [drp] via OPHTHALMIC

## 2019-03-31 MED ORDER — LIDOCAINE HCL 2 % IJ SOLN
INTRAMUSCULAR | Status: AC
  Filled 2019-03-31: qty 20

## 2019-03-31 MED ORDER — SUGAMMADEX SODIUM 200 MG/2ML IV SOLN
INTRAVENOUS | Status: DC | PRN
  Administered 2019-03-31: 200 mg via INTRAVENOUS

## 2019-03-31 MED ORDER — FENTANYL CITRATE (PF) 100 MCG/2ML IJ SOLN
INTRAMUSCULAR | Status: DC | PRN
  Administered 2019-03-31 (×2): 50 ug via INTRAVENOUS

## 2019-03-31 MED ORDER — PROPARACAINE HCL 0.5 % OP SOLN
1.0000 [drp] | OPHTHALMIC | Status: AC | PRN
  Administered 2019-03-31 (×3): 1 [drp] via OPHTHALMIC
  Filled 2019-03-31: qty 15

## 2019-03-31 MED ORDER — LACTATED RINGERS IV SOLN: INTRAVENOUS | Status: DC

## 2019-03-31 MED ORDER — NA CHONDROIT SULF-NA HYALURON 40-30 MG/ML IO SOLN
INTRAOCULAR | Status: DC | PRN
  Administered 2019-03-31: 0.5 mL via INTRAOCULAR

## 2019-03-31 MED ORDER — ATROPINE SULFATE 1 % OP SOLN
OPHTHALMIC | Status: AC
  Filled 2019-03-31: qty 5

## 2019-03-31 MED ORDER — PHENYLEPHRINE HCL 10 % OP SOLN
1.0000 [drp] | OPHTHALMIC | Status: AC | PRN
  Administered 2019-03-31 (×3): 1 [drp] via OPHTHALMIC
  Filled 2019-03-31: qty 5

## 2019-03-31 MED ORDER — POLYMYXIN B SULFATE 500000 UNITS IJ SOLR
INTRAMUSCULAR | Status: AC
  Filled 2019-03-31: qty 500000

## 2019-03-31 MED ORDER — ACETAMINOPHEN 10 MG/ML IV SOLN
INTRAVENOUS | Status: AC
  Filled 2019-03-31: qty 100

## 2019-03-31 MED ORDER — NA CHONDROIT SULF-NA HYALURON 40-30 MG/ML IO SOLN
INTRAOCULAR | Status: AC
  Filled 2019-03-31: qty 0.5

## 2019-03-31 MED ORDER — ACETAMINOPHEN 325 MG PO TABS: 325.0000 mg | ORAL_TABLET | Freq: Once | ORAL | Status: DC | PRN

## 2019-03-31 MED ORDER — PREDNISOLONE ACETATE 1 % OP SUSP
OPHTHALMIC | Status: AC
  Filled 2019-03-31: qty 5

## 2019-03-31 MED ORDER — BSS PLUS IO SOLN
INTRAOCULAR | Status: AC
  Filled 2019-03-31: qty 500

## 2019-03-31 MED ORDER — PREDNISOLONE ACETATE 1 % OP SUSP
OPHTHALMIC | Status: DC | PRN
  Administered 2019-03-31 (×2): 1 [drp] via OPHTHALMIC

## 2019-03-31 MED ORDER — ATROPINE SULFATE 1 % OP SOLN
1.0000 [drp] | OPHTHALMIC | Status: AC | PRN
  Administered 2019-03-31 (×3): 1 [drp] via OPHTHALMIC
  Filled 2019-03-31: qty 2

## 2019-03-31 MED ORDER — CARBACHOL 0.01 % IO SOLN
INTRAOCULAR | Status: AC
  Filled 2019-03-31: qty 1.5

## 2019-03-31 MED ORDER — BACITRACIN-POLYMYXIN B 500-10000 UNIT/GM OP OINT
TOPICAL_OINTMENT | OPHTHALMIC | Status: AC
  Filled 2019-03-31: qty 3.5

## 2019-03-31 MED ORDER — BACITRACIN-POLYMYXIN B 500-10000 UNIT/GM OP OINT
TOPICAL_OINTMENT | OPHTHALMIC | Status: DC | PRN
  Administered 2019-03-31 (×2): 1 via OPHTHALMIC

## 2019-03-31 MED ORDER — SODIUM CHLORIDE 0.9 % IV SOLN
INTRAVENOUS | Status: DC
  Administered 2019-03-31 (×2): via INTRAVENOUS

## 2019-03-31 MED ORDER — TROPICAMIDE 1 % OP SOLN
1.0000 [drp] | OPHTHALMIC | Status: AC | PRN
  Administered 2019-03-31 (×3): 1 [drp] via OPHTHALMIC
  Filled 2019-03-31: qty 15

## 2019-03-31 MED ORDER — DEXAMETHASONE SODIUM PHOSPHATE 10 MG/ML IJ SOLN
INTRAMUSCULAR | Status: AC
  Filled 2019-03-31: qty 1

## 2019-03-31 MED ORDER — PROMETHAZINE HCL 25 MG/ML IJ SOLN: 6.2500 mg | INTRAMUSCULAR | Status: DC | PRN

## 2019-03-31 MED ORDER — SODIUM CHLORIDE 0.9 % IV SOLN
INTRAVENOUS | Status: DC | PRN
  Administered 2019-03-31: 25 ug/min via INTRAVENOUS

## 2019-03-31 MED ORDER — PHENYLEPHRINE 40 MCG/ML (10ML) SYRINGE FOR IV PUSH (FOR BLOOD PRESSURE SUPPORT)
PREFILLED_SYRINGE | INTRAVENOUS | Status: DC | PRN
  Administered 2019-03-31: 40 ug via INTRAVENOUS
  Administered 2019-03-31 (×4): 80 ug via INTRAVENOUS

## 2019-03-31 MED ORDER — PROPOFOL 10 MG/ML IV BOLUS
INTRAVENOUS | Status: DC | PRN
  Administered 2019-03-31: 140 mg via INTRAVENOUS

## 2019-03-31 MED ORDER — MIDAZOLAM HCL 5 MG/5ML IJ SOLN
INTRAMUSCULAR | Status: DC | PRN
  Administered 2019-03-31 (×2): 1 mg via INTRAVENOUS

## 2019-03-31 MED ORDER — FENTANYL CITRATE (PF) 100 MCG/2ML IJ SOLN: 25.0000 ug | INTRAMUSCULAR | Status: DC | PRN

## 2019-03-31 MED ORDER — ATROPINE SULFATE 1 % OP SOLN
OPHTHALMIC | Status: DC | PRN
  Administered 2019-03-31: 1 [drp] via OPHTHALMIC

## 2019-03-31 MED ORDER — SCOPOLAMINE 1 MG/3DAYS TD PT72
MEDICATED_PATCH | TRANSDERMAL | Status: AC
  Filled 2019-03-31: qty 1

## 2019-03-31 MED ORDER — TRIAMCINOLONE ACETONIDE 40 MG/ML IJ SUSP
INTRAMUSCULAR | Status: DC | PRN
  Administered 2019-03-31: .5 mL

## 2019-03-31 MED ORDER — LIDOCAINE 2% (20 MG/ML) 5 ML SYRINGE
INTRAMUSCULAR | Status: DC | PRN
  Administered 2019-03-31: 40 mg via INTRAVENOUS

## 2019-03-31 MED ORDER — ACETAMINOPHEN 10 MG/ML IV SOLN
1000.0000 mg | Freq: Once | INTRAVENOUS | Status: DC | PRN
  Administered 2019-03-31: 1000 mg via INTRAVENOUS

## 2019-03-31 MED ORDER — PHENYLEPHRINE 40 MCG/ML (10ML) SYRINGE FOR IV PUSH (FOR BLOOD PRESSURE SUPPORT)
PREFILLED_SYRINGE | INTRAVENOUS | Status: AC
  Filled 2019-03-31: qty 10

## 2019-03-31 MED ORDER — DORZOLAMIDE HCL-TIMOLOL MAL 2-0.5 % OP SOLN
OPHTHALMIC | Status: DC | PRN
  Administered 2019-03-31: 1 [drp] via OPHTHALMIC

## 2019-03-31 MED ORDER — MEPERIDINE HCL 25 MG/ML IJ SOLN: 6.2500 mg | INTRAMUSCULAR | Status: DC | PRN

## 2019-03-31 MED ORDER — BRIMONIDINE TARTRATE 0.2 % OP SOLN
OPHTHALMIC | Status: AC
  Filled 2019-03-31: qty 5

## 2019-03-31 MED ORDER — EPINEPHRINE PF 1 MG/ML IJ SOLN
INTRAOCULAR | Status: DC | PRN
  Administered 2019-03-31 (×2): 500 mL

## 2019-03-31 MED ORDER — DEXAMETHASONE SODIUM PHOSPHATE 10 MG/ML IJ SOLN
INTRAMUSCULAR | Status: DC | PRN
  Administered 2019-03-31: 4 mg via INTRAVENOUS

## 2019-03-31 MED ORDER — TRIAMCINOLONE ACETONIDE 40 MG/ML IJ SUSP
INTRAMUSCULAR | Status: AC
  Filled 2019-03-31: qty 5

## 2019-03-31 MED ORDER — BSS IO SOLN
INTRAOCULAR | Status: AC
  Filled 2019-03-31: qty 15

## 2019-03-31 MED ORDER — GATIFLOXACIN 0.5 % OP SOLN OPTIME - NO CHARGE
OPHTHALMIC | Status: DC | PRN
  Administered 2019-03-31 (×2): 1 [drp] via OPHTHALMIC

## 2019-03-31 SURGICAL SUPPLY — 74 items
APL SWBSTK 6 STRL LF DISP (MISCELLANEOUS) ×12
APPLICATOR COTTON TIP 6 STRL (MISCELLANEOUS) ×8 IMPLANT
APPLICATOR COTTON TIP 6IN STRL (MISCELLANEOUS) ×24
BALL CTTN LRG ABS STRL LF (GAUZE/BANDAGES/DRESSINGS)
BLADE EYE CATARACT 19 1.4 BEAV (BLADE) IMPLANT
BNDG EYE OVAL (GAUZE/BANDAGES/DRESSINGS) ×6 IMPLANT
CABLE BIPOLOR RESECTION CORD (MISCELLANEOUS) ×4 IMPLANT
CANNULA ANT CHAM MAIN (OPHTHALMIC RELATED) IMPLANT
CANNULA FLEX TIP 25G (CANNULA) ×4 IMPLANT
CANNULA TROCAR 23 GA VLV (OPHTHALMIC) IMPLANT
CANNULA TROCAR 23G VLV (OPHTHALMIC) IMPLANT
CANNULA VLV SOFT TIP 25G (OPHTHALMIC) IMPLANT
CANNULA VLV SOFT TIP 25GA (OPHTHALMIC) IMPLANT
CLOSURE STERI-STRIP 1/2X4 (GAUZE/BANDAGES/DRESSINGS) ×1
CLSR STERI-STRIP ANTIMIC 1/2X4 (GAUZE/BANDAGES/DRESSINGS) ×3 IMPLANT
COTTONBALL LRG STERILE PKG (GAUZE/BANDAGES/DRESSINGS) ×6 IMPLANT
COVER WAND RF STERILE (DRAPES) ×2 IMPLANT
DRAPE MICROSCOPE LEICA 46X105 (MISCELLANEOUS) ×4 IMPLANT
DRAPE OPHTHALMIC 77X100 STRL (CUSTOM PROCEDURE TRAY) ×4 IMPLANT
ERASER HMR WETFIELD 23G BP (MISCELLANEOUS) IMPLANT
FILTER BLUE MILLIPORE (MISCELLANEOUS) IMPLANT
FILTER STRAW FLUID ASPIR (MISCELLANEOUS) ×2 IMPLANT
FORCEPS GRIESHABER 23G SERR (INSTRUMENTS) IMPLANT
FORCEPS GRIESHABER ILM 25G A (INSTRUMENTS) ×2 IMPLANT
GAS AUTO FILL CONSTEL (OPHTHALMIC)
GAS AUTO FILL CONSTELLATION (OPHTHALMIC) IMPLANT
GAUZE SPONGE 4X4 12PLY STRL (GAUZE/BANDAGES/DRESSINGS) ×2 IMPLANT
GLOVE BIO SURGEON STRL SZ7.5 (GLOVE) ×8 IMPLANT
GLOVE BIOGEL M 7.0 STRL (GLOVE) ×6 IMPLANT
GOWN STRL REUS W/ TWL LRG LVL3 (GOWN DISPOSABLE) ×4 IMPLANT
GOWN STRL REUS W/ TWL XL LVL3 (GOWN DISPOSABLE) ×2 IMPLANT
GOWN STRL REUS W/TWL LRG LVL3 (GOWN DISPOSABLE) ×12
GOWN STRL REUS W/TWL XL LVL3 (GOWN DISPOSABLE) ×4
KIT BASIN OR (CUSTOM PROCEDURE TRAY) ×4 IMPLANT
KIT PERFLUORON PROCEDURE 5ML (MISCELLANEOUS) IMPLANT
LENS MACULAR ASPHERIC CONSTEL (OPHTHALMIC) IMPLANT
LENS VITRECTOMY FLAT OCLR DISP (MISCELLANEOUS) ×2 IMPLANT
LOOP FINESSE 25 GA (MISCELLANEOUS) IMPLANT
MICROPICK 25G (MISCELLANEOUS)
NDL 18GX1X1/2 (RX/OR ONLY) (NEEDLE) ×2 IMPLANT
NDL 25GX 5/8IN NON SAFETY (NEEDLE) ×6 IMPLANT
NDL HYPO 30X.5 LL (NEEDLE) ×4 IMPLANT
NDL PRECISIONGLIDE 27X1.5 (NEEDLE) IMPLANT
NEEDLE 18GX1X1/2 (RX/OR ONLY) (NEEDLE) ×8 IMPLANT
NEEDLE 25GX 5/8IN NON SAFETY (NEEDLE) ×12 IMPLANT
NEEDLE HYPO 30X.5 LL (NEEDLE) ×4 IMPLANT
NEEDLE PRECISIONGLIDE 27X1.5 (NEEDLE) IMPLANT
NS IRRIG 1000ML POUR BTL (IV SOLUTION) ×2 IMPLANT
OIL SILICONE OPHTHALMIC 1000 (Ophthalmic Related) ×2 IMPLANT
PACK VITRECTOMY CUSTOM (CUSTOM PROCEDURE TRAY) ×4 IMPLANT
PAD ARMBOARD 7.5X6 YLW CONV (MISCELLANEOUS) ×8 IMPLANT
PAK PIK VITRECTOMY CVS 25GA (OPHTHALMIC) ×4 IMPLANT
PENCIL BIPOLAR 25GA STR DISP (OPHTHALMIC RELATED) ×2 IMPLANT
PICK MICROPICK 25G (MISCELLANEOUS) IMPLANT
PROBE DIATHERMY DSP 27GA (MISCELLANEOUS) IMPLANT
PROBE ENDO DIATHERMY 25G (MISCELLANEOUS) ×2 IMPLANT
PROBE LASER ILLUM FLEX CVD 25G (OPHTHALMIC) ×2 IMPLANT
REPL STRA BRUSH NDL (NEEDLE) IMPLANT
REPL STRA BRUSH NEEDLE (NEEDLE) IMPLANT
RESERVOIR BACK FLUSH (MISCELLANEOUS) IMPLANT
RETRACTOR IRIS FLEX 25G GRIESH (INSTRUMENTS) IMPLANT
SET INJECTOR OIL FLUID CONSTEL (OPHTHALMIC) ×2 IMPLANT
SHIELD EYE LENSE ONLY DISP (GAUZE/BANDAGES/DRESSINGS) ×2 IMPLANT
SPONGE SURGIFOAM ABS GEL 12-7 (HEMOSTASIS) IMPLANT
STOPCOCK 4 WAY LG BORE MALE ST (IV SETS) IMPLANT
SUT VICRYL 7 0 TG140 8 (SUTURE) ×4 IMPLANT
SYR 10ML LL (SYRINGE) ×4 IMPLANT
SYR 20ML LL LF (SYRINGE) ×4 IMPLANT
SYR 5ML LL (SYRINGE) ×4 IMPLANT
SYR BULB 3OZ (MISCELLANEOUS) ×4 IMPLANT
SYR TB 1ML LUER SLIP (SYRINGE) ×8 IMPLANT
TOWEL GREEN STERILE FF (TOWEL DISPOSABLE) ×4 IMPLANT
TUBING HIGH PRESS EXTEN 6IN (TUBING) ×4 IMPLANT
WATER STERILE IRR 1000ML POUR (IV SOLUTION) ×4 IMPLANT

## 2019-03-31 NOTE — Interval H&P Note (Signed)
History and Physical Interval Note:  03/31/2019 9:13 AM  Randy Oneal  has presented today for surgery, with the diagnosis of left eye retinal detachment.  The various methods of treatment have been discussed with the patient and family. After consideration of risks, benefits and other options for treatment, the patient has consented to  Procedure(s): PARS PLANA VITRECTOMY WITH 25 GAUGE WITH MEMBRANE PEEL (Left) as a surgical intervention.  The patient's history has been reviewed, patient examined, no change in status, stable for surgery.  I have reviewed the patient's chart and labs.  Questions were answered to the patient's satisfaction.     Bernarda Caffey

## 2019-03-31 NOTE — Transfer of Care (Signed)
Immediate Anesthesia Transfer of Care Note  Patient: [name] III  Procedure(s) Performed: PARS PLANA VITRECTOMY WITH 25 GAUGE WITH MEMBRANE PEEL (Left Eye) Photocoagulation With Laser (Left Eye) Gas/Fluid Exchange (Left Eye) Silicon Oil Removal (Right Eye)  Patient Location: PACU  Anesthesia Type:General  Level of Consciousness: drowsy  Airway & Oxygen Therapy: Patient Spontanous Breathing and Patient connected to face mask oxygen  Post-op Assessment: Report given to RN and Post -op Vital signs reviewed and stable  Post vital signs: Reviewed and stable  Last Vitals:  Vitals Value Taken Time  BP    Temp    Pulse 69 03/31/19 1457  Resp 14 03/31/19 1457  SpO2 100 % 03/31/19 1457  Vitals shown include unvalidated device data.  Last Pain:  Vitals:   03/31/19 0900  PainSc: 0-No pain      Patients Stated Pain Goal: 0 (11/22/62 3837)  Complications: No apparent anesthesia complications

## 2019-03-31 NOTE — Brief Op Note (Signed)
03/31/2019  3:00 PM  PATIENT:  [name] III  53 y.o. male  PRE-OPERATIVE DIAGNOSIS:  left eye retinal detachment, subconjuctival silicone oil prolapse right eye  POST-OPERATIVE DIAGNOSIS:  left eye retinal detachment, subconjuctival silicone oil prolapse right eye  PROCEDURE:  Procedure(s): PARS PLANA VITRECTOMY WITH 25 GAUGE WITH MEMBRANE PEEL (Left) Photocoagulation With Laser (Left) Gas/Fluid Exchange (Left) Silicon Oil Removal (Right)  SURGEON:  Surgeon(s) and Role:    Bernarda Caffey, MD - Primary  ASSISTANTS: Ernest Mallick, Ophthalmic Assistant  ANESTHESIA:   general  EBL:  5 mL   BLOOD ADMINISTERED:none  DRAINS: none   LOCAL MEDICATIONS USED:  NONE  SPECIMEN:  No Specimen  DISPOSITION OF SPECIMEN:  N/A  COUNTS:  YES  TOURNIQUET:  * No tourniquets in log *  DICTATION: .Note written in EPIC  PLAN OF CARE: Discharge to home after PACU  PATIENT DISPOSITION:  PACU - hemodynamically stable.   Delay start of Pharmacological VTE agent (>24hrs) due to surgical blood loss or risk of bleeding: not applicable

## 2019-03-31 NOTE — Discharge Instructions (Addendum)
POSTOPERATIVE INSTRUCTIONS  Your doctor has performed vitreoretinal surgery on you at [facility]. [place] eye patched and shielded until seen by Dr. Coralyn Pear 9 AM tomorrow in clinic - Do not use drops in LEFT EYE until return - You may use your drops and ointment in the RIGHT EYE as scheduled - FACE DOWN [place] - Sleep with belly down or on left side, avoid laying flat on back.    - No strenuous bending, stooping or lifting.  - You may not drive until further notice.  - If your doctor used a gas bubble in your eye during the procedure he will advise you on postoperative positioning. If you have a gas bubble you will be wearing a green bracelet that was applied in the operating room. The green bracelet should stay on as long as the gas bubble is in your eye. While the gas bubble is present you should not fly in an airplane. If you require general anesthesia while the gas bubble is present you must notify your anesthesiologist that an intraocular gas bubble is present so he can take the appropriate precautions.  - Tylenol or any other over-the-counter pain reliever can be used according to your doctor. If more pain medicine is required, your doctor will have a prescription for you.  - You may read, go up and down stairs, and watch television.     Bernarda Caffey, M.D., Ph.D.

## 2019-03-31 NOTE — Anesthesia Procedure Notes (Signed)
Procedure Name: Intubation Date/Time: 03/31/2019 11:32 AM Performed by: Janene Harvey, CRNA Pre-anesthesia Checklist: Patient identified, Emergency Drugs available, Suction available and Patient being monitored Patient Re-evaluated:Patient Re-evaluated prior to induction Oxygen Delivery Method: Circle system utilized Preoxygenation: Pre-oxygenation with 100% oxygen Induction Type: IV induction Ventilation: Two handed mask ventilation required Laryngoscope Size: Mac and 4 Grade View: Grade II Tube type: Oral Tube size: 7.5 mm Number of attempts: 1 Airway Equipment and Method: Stylet Placement Confirmation: ETT inserted through vocal cords under direct vision,  positive ETCO2 and breath sounds checked- equal and bilateral Secured at: 23 cm Tube secured with: Tape Dental Injury: Teeth and Oropharynx as per pre-operative assessment

## 2019-03-31 NOTE — Op Note (Signed)
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 Removal of subconjunctival silicon oil, Right Eye 2. 25 gauge pars plana vitrectomy, Left Eye 3. Preretinal membrane peel, Left Eye 4. Endolaser, Left Eye 5. Fluid Air Exchange, Left Eye 6. Injection of [id number] silicon oil, Left Eye 7. Intravitreal injection of bevacizumab, Left Eye     Indications for procedure: This is a 53 yo M with a history of Proliferative diabetic retinopathy OU. Right eye underwent tractional retinal detachment repair on 7.16.2020 and developed prolapse of silicon oil in the subconjunctival space. 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  After discussing the risks, benefits, and alternatives to surgery, the patient electively decided to proceed with surgery to remove the subconjunctival silicon oil in the right eye and repair the retinal detachment in the left. Informed consent was obtained and signed. The surgery was an attempt to remove the silicon oil from the subconjunctival space OD, and to remove all preretinal/tractional membranes from the left eye and reattach the focally detached retina and potentially improve the vision within the reasonable expectations of the surgeon.    Procedure in Detail:    The patient was met in the pre-operative holding area where their identification data was verified.  It was noted that there was a signed, informed consent in the chart and both eyes were verbally verified by the patient--right eye for silicon oil removal from subconjunctival space and left eye for retinal detachment surgery. Then the operative left eye and  was marked with the word "surgery" using a marking pen. The patient was then taken to the operating room and placed in the supine position. General endotracheal anesthesia was induced.   Attention was first turned to the RIGHT EYE for the removal of subconjunctival silicon oil. The RIGHT EYE was prepped with 5% betadine and draped in the normal fashion for ophthalmic surgery.  A secondary time-out was performed to identify the correct patient, eyes, procedures, and any allergies. A Liebermann lid speculum was placed and blunt Westcott scissors were used to cut down the conjunctiva and q-tips were used to push the subconjuntival silicon oil out of the incisions made both nasally and temporally. The majority of subconj oil was able to be removed although there were some minimal residual pockets of silicon oil that were unable to be cleared. The larger temporal incision was closed with 7-0 vicryl. The smaller nasal incisions were left open to allow for further egression of silicon oil and will adequately without suture. Following completion of these maneuvers, antibiotic ophthalmic ointment was applied to the right eye and the eyelid was patched and shielded.   Attention was then turned to the LEFT EYE for repair of the combined tractional/rhegmatogenous retinal detachment via 25g PPV. The LEFT EYE was then prepped with 5% betadine and draped in the normal fashion for ophthalmic surgery. The microscope was draped and swung into position, and another time-out was performed to identify the correct patient, eyes, procedures, and any allergies.   A 25 gauge trocar was inserted in a 30-45 degrees fashion into the inferotemporal quadrant 3.5 mm posterior to the limbus in this pseudophakic patient. Correct positioning within the vitreous was verified externally  with the light pipe. The infusion was then connected to the cannula and BSS infusion was commenced.  Additional ports were placed in the superonasal and  superotemporal quadrants. Viscoat was placed on the cornea. Direct vitrectomy was performed using the vitrectomy probe to clear the anterior hyaloid. The BIOM was then used to visualize the posterior segment. The patient had significant preretinal tractional fibrosis emanating from the optic disc and along the temporal arcades and macula. There was a focal tractional detachment along the distal ST arcades with a retinal hole at the apex of the tractional detachment. A core vitrectomy was performed using the BIOM visualization system, vitrectomy probe and light pipe. Kenalog was used to mark the vitreous and assist in dissecting vitreous membranes. Of note, the vitreous was very sticky, thick, and multilaminar.  ILM forceps were used to induced a PVD as the posterior vitreous over the disc was very fibrotic. This allowed for a more complete removal and dissection of the vitreous.   A macular contact lens was placed on the eye. End-grasping ILM forceps and the vitrectomy probe were used to carefully peel and dissect preretinal membranes off the entire surface of the retina as was deemed safe.  There were significant focal tufts of tractional fibrosis throughout the posterior pole, mostly along the vascular arcades.     The wide angle viewing system was brought back into position to assist in peeling peripheral membranes. A very thorough peripheral vitreous shave was performed under scleral depression. No peripheral retinal tears were noted. At this time, panretinal photocoagulation was performed via endolaser 360: anteriorly to ora and posteriorly almost to the arcades. An air fluid exchange was then performed over the posterior retinal hole to remove the intravitreal BSS and reattach the focal retinal detachment. After meticulous drying of the retinal hole, the retina was flat and reattached. Endolaser was used to laser the edges of the retinal hole. Then, 1103 cs silicon oil was injected via the superior nasal  trocar and filled up to the level of the lens plane with the aide of the venting cannula. The three trocars were then removed and sutured with 7-0 vicryl, there was no leakage from the sclerotomy sites. Subconjunctival injections of kefzol + bacitracin + polymixin b and kenalog were then administered.   Next, 5% betadine was applied to the eye to prep for intravitreal injection.  0.05 cc of bevacizumab was injected into the vitreous cavity 3.5 mm posterior to the limbus in the superonasal quadrant. Betadine was reapplied to the injection site and over the cornea. An anterior chamber paracentesis was performed to remove some aqueous to normalize the intraocular pressure to physiologic level. Betadine was again reapplied to the eye, then completely rinsed out with sterile BSS and then bug juice. Antibiotic and steroid drops as well as antibiotic ointment were placed in the eye. The drapes were removed and the eye was patched and shielded.  The patient was then taken to the post-operative area for recovery having tolerated the procedure well. He was instructed to perform face down positioning postoperatively and to follow up in clinic the following morning as scheduled.   Estimate blood lost: none Complications: None

## 2019-04-01 ENCOUNTER — Encounter (HOSPITAL_COMMUNITY): Payer: Self-pay | Admitting: Ophthalmology

## 2019-04-01 ENCOUNTER — Ambulatory Visit (INDEPENDENT_AMBULATORY_CARE_PROVIDER_SITE_OTHER): Payer: Managed Care, Other (non HMO) | Admitting: Ophthalmology

## 2019-04-01 ENCOUNTER — Other Ambulatory Visit: Payer: Self-pay

## 2019-04-01 DIAGNOSIS — H35033 Hypertensive retinopathy, bilateral: Secondary | ICD-10-CM

## 2019-04-01 DIAGNOSIS — I10 Essential (primary) hypertension: Secondary | ICD-10-CM

## 2019-04-01 DIAGNOSIS — E113513 Type 2 diabetes mellitus with proliferative diabetic retinopathy with macular edema, bilateral: Secondary | ICD-10-CM

## 2019-04-01 DIAGNOSIS — H4313 Vitreous hemorrhage, bilateral: Secondary | ICD-10-CM

## 2019-04-01 DIAGNOSIS — H3343 Traction detachment of retina, bilateral: Secondary | ICD-10-CM

## 2019-04-01 DIAGNOSIS — Z961 Presence of intraocular lens: Secondary | ICD-10-CM

## 2019-04-01 DIAGNOSIS — H4921 Sixth [abducent] nerve palsy, right eye: Secondary | ICD-10-CM

## 2019-04-01 DIAGNOSIS — H3581 Retinal edema: Secondary | ICD-10-CM

## 2019-04-01 NOTE — Anesthesia Postprocedure Evaluation (Signed)
Anesthesia Post Note  Patient: Randy Oneal  Procedure(s) Performed: PARS PLANA VITRECTOMY WITH 25 GAUGE WITH MEMBRANE PEEL (Left Eye) Photocoagulation With Laser (Left Eye) Gas/Fluid Exchange (Left Eye) Silicon Oil Removal (Right Eye)     Patient location during evaluation: PACU Anesthesia Type: General Level of consciousness: awake and alert Pain management: pain level controlled Vital Signs Assessment: post-procedure vital signs reviewed and stable Respiratory status: spontaneous breathing, nonlabored ventilation, respiratory function stable and patient connected to nasal cannula oxygen Cardiovascular status: blood pressure returned to baseline and stable Postop Assessment: no apparent nausea or vomiting Anesthetic complications: no    Last Vitals:  Vitals:   03/31/19 1557 03/31/19 1615  BP: (!) 166/97 (!) 155/90  Pulse: 69 70  Resp: 20 20  Temp: 36.7 C   SpO2: 99% 100%    Last Pain:  Vitals:   03/31/19 1615  PainSc: Asleep                 Effie Berkshire

## 2019-04-03 ENCOUNTER — Encounter (INDEPENDENT_AMBULATORY_CARE_PROVIDER_SITE_OTHER): Payer: Self-pay | Admitting: Ophthalmology

## 2019-04-05 ENCOUNTER — Encounter (HOSPITAL_COMMUNITY): Payer: Managed Care, Other (non HMO)

## 2019-04-07 ENCOUNTER — Ambulatory Visit (HOSPITAL_COMMUNITY): Payer: Managed Care, Other (non HMO) | Admitting: Physical Therapy

## 2019-04-07 ENCOUNTER — Telehealth (HOSPITAL_COMMUNITY): Payer: Self-pay | Admitting: Family Medicine

## 2019-04-07 NOTE — Telephone Encounter (Signed)
I called to confirm that patient wanted to cancel and he said he did... he will get an update tomorrow on his eyes and if he can resume therapy  04/07/19

## 2019-04-07 NOTE — Progress Notes (Addendum)
[facility] Note  04/08/2019     CHIEF COMPLAINT Patient presents for Post-op Follow-up   HISTORY OF PRESENT ILLNESS: Randy Oneal is a 53 y.o. male who presents to the clinic today for:   HPI    Post-op Follow-up    In both eyes.  Discomfort includes itching.  Negative for pain, foreign body sensation, tearing, discharge, floaters and none.  Vision is stable.  I, the attending physician,  performed the HPI with the patient and updated documentation appropriately.          Comments    Patient states OS itches at times. Vision seems about the same OU. Using PF qid OU, zymaxid qid OU, polytrim ung qid OU, brimonidine bid OS, and atropine bid OS. BS was 211 yesterday am. Last a1c was 7.7 on 07.07.20.       Last edited by Bernarda Caffey, MD on 04/10/2019 12:51 PM. (History)    Pt states he can tell his vision is getting better, but things are still not "crisp", pt states he is able to get all drops in as directed  Referring physician: [name], MD [place],  [place] [address]  HISTORICAL INFORMATION:   Selected notes from the MEDICAL RECORD NUMBER Referred by Dr.Mark Gershon Crane for concern of decreased vision post cataract sx LEE:  Ocular Hx- PMH-   CURRENT MEDICATIONS: Current Outpatient Medications (Ophthalmic Drugs)  Medication Sig  . atropine 1 % ophthalmic solution Place 1 drop into the left eye 2 (two) times daily.  . brimonidine (ALPHAGAN) 0.2 % ophthalmic solution 2 (two) times daily.  Marland Kitchen gatifloxacin (ZYMAXID) 0.5 % SOLN Place 1 drop into both eyes 4 (four) times daily.  . prednisoLONE acetate (PRED FORTE) 1 % ophthalmic suspension Place 1 drop into both eyes 4 (four) times daily.  . bacitracin-polymyxin b (POLYSPORIN) ophthalmic ointment Place 1 application into both eyes 4 (four) times daily.   . dorzolamide-timolol (COSOPT) 22.3-6.8 MG/ML ophthalmic solution Place 1 drop into the right eye daily.    No  current facility-administered medications for this visit.  (Ophthalmic Drugs)   Current Outpatient Medications (Other)  Medication Sig  . acetaminophen (TYLENOL) 500 MG tablet Take 1,000 mg by mouth every 6 (six) hours as needed for moderate pain or headache.  Marland Kitchen amLODipine (NORVASC) 10 MG tablet Take one tablet by mouth each day (Patient taking differently: Take 10 mg by mouth daily. )  . atorvastatin (LIPITOR) 10 MG tablet Take 1 tablet (10 mg total) by mouth daily at 6 PM. (Patient taking differently: Take 10 mg by mouth daily. )  . Continuous Blood Gluc Sensor (FREESTYLE LIBRE SENSOR SYSTEM) MISC Use one sensor every 10 days.  . famotidine (PEPCID) 40 MG tablet Take one tablet by mouth daily (Patient taking differently: Take 40 mg by mouth daily as needed for heartburn. )  . glucose blood (BAYER CONTOUR NEXT TEST) test strip USE TO TEST FOUR TIMES DAILY.  Marland Kitchen insulin lispro (HUMALOG KWIKPEN) 100 UNIT/ML KwikPen Inject 0.06 mLs (6 Units total) into the skin daily with breakfast AND 0.07 mLs (7 Units total) 2 (two) times daily before lunch and supper. (Patient taking differently: inject 5-9 unit with each meal)  . Insulin Pen Needle (BD PEN NEEDLE NANO 2ND GEN) 32G X 4 MM MISC Four times daily  . metoprolol tartrate (LOPRESSOR) 50 MG tablet Take one half tablet by mouth twice daily (Patient taking differently: Take 25 mg by mouth 2 (  two) times daily. )  . MICROLET LANCETS MISC USE FOUR TIMES A DAY AS DIRECTED.  Marland Kitchen nystatin (MYCOSTATIN/NYSTOP) powder Apply topically 2 (two) times daily. To groin and scrotum (Patient taking differently: Apply 1 g topically 2 (two) times daily as needed (rash). To groin and scrotum)  . sildenafil (REVATIO) 20 MG tablet May take up to 5 before relations as directed  . Simethicone (GAS-X PO) Take 1 tablet by mouth daily as needed (gas).  . TRESIBA FLEXTOUCH 100 UNIT/ML SOPN FlexTouch Pen Inject 0.19 mLs (19 Units total) into the skin at bedtime. (Patient taking  differently: Inject 22 Units into the skin at bedtime. )  . pantoprazole (PROTONIX) 40 MG tablet Take 1 tablet (40 mg total) by mouth daily. (Patient not taking: Reported on 04/08/2019)   No current facility-administered medications for this visit.  (Other)      REVIEW OF SYSTEMS: ROS    Positive for: Endocrine, Eyes   Negative for: Constitutional, Gastrointestinal, Neurological, Skin, Genitourinary, Musculoskeletal, HENT, Cardiovascular, Respiratory, Psychiatric, Allergic/Imm, Heme/Lymph   Last edited by Roselee Nova D, COT on 04/08/2019  9:30 AM. (History)       ALLERGIES Allergies  Allergen Reactions  . Tramadol Nausea And Vomiting    PAST MEDICAL HISTORY Past Medical History:  Diagnosis Date  . Diabetes mellitus    Type 2   . Diabetic retinopathy of both eyes ([place]) 01/31/2019   Dr Coralyn Pear 01/2019  . Hypertension   . Renal insufficiency    ckd stage 4  . Retinal detachment    OS  . Sepsis due to Streptococcus, group B ([place]) 10/05/2018   Past Surgical History:  Procedure Laterality Date  . [place] VITRECTOMY WITH 20 GAUGE MVR PORT FOR MACULAR HOLE Right 02/24/2019   Procedure: 25 GAUGE PARS PLANA VITRECTOMY WITH 20 GAUGE MVR PORT FOR MACULAR HOLE;  Surgeon: Bernarda Caffey, MD;  Location: [place];  Service: Ophthalmology;  Laterality: Right;  . APPLICATION OF WOUND VAC Left 08/27/2018   Procedure: APPLICATION OF WOUND VAC, left neck;  Surgeon: Gaye Pollack, MD;  Location: [place];  Service: Thoracic;  Laterality: Left;  . APPLICATION OF WOUND VAC Left 08/30/2018   Procedure: APPLICATION OF WOUND VAC;  Surgeon: Gaye Pollack, MD;  Location: [facility] [place];  Service: Thoracic;  Laterality: Left;  . CATARACT EXTRACTION Bilateral   . CATARACT EXTRACTION W/ INTRAOCULAR LENS  IMPLANT, BILATERAL    . COLONOSCOPY  06/24/2011   Procedure: COLONOSCOPY;  Surgeon: [name], MD;  [facility];  Service: Endoscopy;  Laterality: N/A;  8:30 AM  . EYE SURGERY    .  GAS/FLUID EXCHANGE Left 03/31/2019   Procedure: Gas/Fluid Exchange;  Surgeon: Bernarda Caffey, MD;  Location: [place];  Service: Ophthalmology;  Laterality: Left;  . INJECTION OF SILICONE OIL  12/03/9561   Procedure: Injection Of Silicone Oil;  Surgeon: Bernarda Caffey, MD;  Location: [place];  Service: Ophthalmology;;  . MEMBRANE PEEL Right 02/24/2019   Procedure: Antoine Primas;  Surgeon: Bernarda Caffey, MD;  Location: [place];  Service: Ophthalmology;  Laterality: Right;  . PARS PLANA VITRECTOMY Left 03/31/2019   Procedure: PARS PLANA VITRECTOMY WITH 25 GAUGE WITH MEMBRANE PEEL;  Surgeon: Bernarda Caffey, MD;  Location: [place];  Service: Ophthalmology;  Laterality: Left;  . PHOTOCOAGULATION WITH LASER Right 02/24/2019   Procedure: Photocoagulation With Laser;  Surgeon: Bernarda Caffey, MD;  Location: [place];  Service: Ophthalmology;  Laterality: Right;  . PHOTOCOAGULATION WITH LASER Left 03/31/2019  Procedure: Photocoagulation With Laser;  Surgeon: Bernarda Caffey, MD;  Location: [facility];  Service: Ophthalmology;  Laterality: Left;  . RETINAL DETACHMENT SURGERY Left 03/31/2019   TRD Repair - Dr. Bernarda Caffey  . SILICON OIL REMOVAL Right 3/41/9622   Procedure: Silicon Oil Removal;  Surgeon: Bernarda Caffey, MD;  Location: [place];  Service: Ophthalmology;  Laterality: Right;  . STERNAL WOUND DEBRIDEMENT Left 08/27/2018   Procedure: Incision and DEBRIDEMENT Left Chest, Neck and Mediastinum;  Surgeon: Gaye Pollack, MD;  Location: [facility];  Service: Thoracic;  Laterality: Left;  . STERNAL WOUND DEBRIDEMENT Left 08/30/2018   Procedure: WOUND VAC CHANGE, LEFT CHEST AND NECK, POSSIBLE DEBRIDEMENT;  Surgeon: Gaye Pollack, MD;  Location[facility];  Service: Thoracic;  Laterality: Left;    FAMILY HISTORY Family History  Problem Relation Age of Onset  . Hypertension Mother   . Colon cancer Neg Hx     SOCIAL HISTORY Social History   Tobacco Use  . Smoking status: Never Smoker  . Smokeless tobacco: Never Used  Substance  Use Topics  . Alcohol use: No  . Drug use: No         OPHTHALMIC EXAM:  Base Eye Exam    Visual Acuity (Snellen - Linear)      Right Left   Dist [place] CF at 2' 20/250 -2   Dist ph [place] 20/300 20/150       Tonometry (Tonopen, 9:48 AM)      Right Left   Pressure 12 09       Pupils      Dark Light Shape React APD   Right 7 7 Round None None   Left 7 7 Round None None  Using atropine OS only       Extraocular Movement      Right Left    -4 -- --  -4  --  -4 -- --   0 0 0  0  0  0 0 0         Neuro/Psych    Oriented x3: Yes   Mood/Affect: Normal       Dilation    Both eyes: 1.0% Mydriacyl, 2.5% Phenylephrine @ 9:48 AM        Slit Lamp and Fundus Exam    Slit Lamp Exam      Right Left   Lids/Lashes mild Meibomian gland dysfunction mild Meibomian gland dysfunction   Conjunctiva/Sclera Improved subconj silicon oil, sutures intact, Subconjunctival hemorrhage, 1+ Injection Mild residual Subconjunctival hemorrhage temporally, sutures intact   Cornea 2-3+ PEE, Well healed cataract wounds Well healed temporal cataract wounds, 1+ Punctate epithelial erosions   Anterior Chamber Deep, clear Deep and quiet   Iris slightly irregular, dilated, posterior synchiae @ 2:00, 3:00-10:30 Round and dilated   Lens PC IOL in good position, 1+ PCO PC IOL in good position   Vitreous Post vit, silicon oil bubble ~29-79%. post vitrectomy, good silicone oil bubble fill       Fundus Exam      Right Left   Disc 2-3+pallor, sharp rim, central heme resolved 0.5+pallor, improved fibrosis   C/D Ratio 0.4 0.4   Macula Flat under oil, Blunted foveal reflex, few Microaneurysms Flat under oil, blunted foveal reflex, reattachment of focal RD with good laser around hole, fibrosis vastly improved   Vessels attenuated; tortuous; improved fibrosis -  mild fibrotic remnants attenuated; fibrosis along IT arcades improved   Periphery Attached, good 360 PRP laser, mild residual shallow SRF inferiorly --  improving, fibrosis along ST arcades improved under oil Attached, focal detachment resolved under oil, good 360 PRP laser changes, improved fibrosis          IMAGING AND PROCEDURES  Imaging and Procedures for @TODAY @  OCT, Retina - OU - Both Eyes       Right Eye Quality was good. Central Foveal Thickness: 667. Progression has improved. Findings include preretinal fibrosis, abnormal foveal contour, subretinal fluid, outer retinal atrophy, epiretinal membrane, intraretinal fluid (Macula reattached under oil, shallow SRF inferior periphery caught on widefield -- improved from prior).   Left Eye Quality was good. Central Foveal Thickness: 424. Progression has improved. Findings include vitreous traction, preretinal fibrosis, intraretinal fluid, abnormal foveal contour, intraretinal hyper-reflective material, epiretinal membrane, subretinal fluid (Improved pucker, focal detachment re-attached with trace residual SRF).   Notes *Images captured and stored on drive  Diagnosis / Impression:  OD: Macula reattached under oil,  shallow SRF inferior periphery caught on widefield -- improved OS: improved pucker, focal detachment re-attached with trace residual SRF   Clinical management:  See below  Abbreviations: NFP - Normal foveal profile. CME - cystoid macular edema. PED - pigment epithelial detachment. IRF - intraretinal fluid. SRF - subretinal fluid. EZ - ellipsoid zone. ERM - epiretinal membrane. ORA - outer retinal atrophy. ORT - outer retinal tubulation. SRHM - subretinal hyper-reflective material                 ASSESSMENT/PLAN:    ICD-10-CM   1. Proliferative diabetic retinopathy of both eyes with macular edema associated with type 2 diabetes mellitus ([place])  G28.3662   2. 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 Vitreous hemorrhage of both eyes ([place])  H43.13   5. Retinal edema  H35.81 OCT, Retina - OU - Both Eyes   6. Essential hypertension  I10   7. Hypertensive retinopathy of both eyes  H35.033   8. Pseudophakia of both eyes  Z96.1     1-5. Proliferative diabetic retinopathy with DME, TRD, and vitreous hemorrhage OU (OD > OS)  - pt with complex medical history with hospitalization in Jan-Feb 2020 for bacteremia and abscess  - history of poor glycemic control for years  - s/p IVA OU #1 (06.02.20), #2 (07.01.20)  - s/p PRP OS (06.10.20)  - BCVA OD 20/300; OS 20/150  - exam shows diffuse vitreous and preretinal hemorrhage + scattered fibrosis OU   - pre-op clearing of VH OD has improved visualization of posterior pole --+macular TRD OD; and ?regressing NVD OS  - pre-op OCT shows preretinal fibrosis w/ traction OU (OD > OS)  - s/p PPV/PFC/EL/FAX/silicone oil OD, 94.76.54  - s/p subconj silicon oil removal OD 8.20.20  - now POW1 s/p PPV/EL/FAX/silicone oil OS, 65.03.54             - doing well   - subconj silicon oil prolapse OD -- improved today             - OD: improved fibrosis, retina attached and in good position under oil, mild residual SRF inferiorly  - OD with post op CN VI palsy / abduction deficit -- monitor -- may need further eval once stable post op  - OS: improved fibrosis, focal RD reattached under oil             - IOP 12, 09             - cont PF to 4x/day  OU                         brimonidine QD daily OU                           zymaxid QID OU -- stop when bottle runs out                         Atropine BID OS only -- stop when bottle runs out                         PSO ung QID OU             - cont face down 50% of time; avoid laying flat on back             - eye shield OS when sleepingx1 more week             - post op drop and positioning instructions reviewed             - tylenol/ibuprofen for pain  - f/u 3 week  6,7. Hypertensive retinopathy OU  - discussed importance of tight BP control  - monitor  8. Pseudophakia OU  - s/p CE/IOL OU (Dr.  Gershon Crane)   Ophthalmic Meds Ordered this visit:  Meds ordered this encounter  Medications  . prednisoLONE acetate (PRED FORTE) 1 % ophthalmic suspension    Sig: Place 1 drop into both eyes 4 (four) times daily.    Dispense:  15 mL    Refill:  0       Return for f/u 2-3 weeks, POV OU.  There are no Patient Instructions on file for this visit.   Explained the diagnoses, plan, and follow up with the patient and they expressed understanding.  Patient expressed understanding of the importance of proper follow up care.   This document serves as a record of services personally performed by Gardiner Sleeper, MD, PhD. It was created on their behalf by Ernest Mallick, OA, an ophthalmic assistant. The creation of this record is the provider's dictation and/or activities during the visit.    Electronically signed by: Ernest Mallick, OA 08.27.2020 1:04 PM    Gardiner Sleeper, M.D., Ph.D. Diseases & Surgery of the Retina and Vitreous [facility]  I have reviewed the above documentation for accuracy and completeness, and I agree with the above. Gardiner Sleeper, M.D., Ph.D. 04/10/19 1:04 PM    Abbreviations: M myopia (nearsighted); A astigmatism; H hyperopia (farsighted); P presbyopia; Mrx spectacle prescription;  CTL contact lenses; OD right eye; OS left eye; OU both eyes  XT exotropia; ET esotropia; PEK punctate epithelial keratitis; PEE punctate epithelial erosions; DES dry eye syndrome; MGD meibomian gland dysfunction; ATs artificial tears; PFAT's preservative free artificial tears; [place] nuclear sclerotic cataract; PSC posterior subcapsular cataract; ERM epi-retinal membrane; PVD posterior vitreous detachment; RD retinal detachment; DM diabetes mellitus; DR diabetic retinopathy; NPDR non-proliferative diabetic retinopathy; PDR proliferative diabetic retinopathy; CSME clinically significant macular edema; DME diabetic macular edema; dbh dot blot hemorrhages; CWS cotton wool spot;  POAG primary open angle glaucoma; C/D cup-to-disc ratio; HVF humphrey visual field; GVF goldmann visual field; OCT optical coherence tomography; IOP intraocular pressure; BRVO Branch retinal vein occlusion; CRVO central retinal vein occlusion; CRAO central retinal artery occlusion; BRAO branch retinal artery occlusion; RT retinal  tear; SB scleral buckle; PPV pars plana vitrectomy; VH Vitreous hemorrhage; PRP panretinal laser photocoagulation; IVK intravitreal kenalog; VMT vitreomacular traction; MH Macular hole;  NVD neovascularization of the disc; NVE neovascularization elsewhere; AREDS age related eye disease study; ARMD age related macular degeneration; POAG primary open angle glaucoma; EBMD epithelial/anterior basement membrane dystrophy; ACIOL anterior chamber intraocular lens; IOL intraocular lens; PCIOL posterior chamber intraocular lens; Phaco/IOL phacoemulsification with intraocular lens placement; [place] photorefractive keratectomy; LASIK laser assisted in situ keratomileusis; HTN hypertension; DM diabetes mellitus; COPD chronic obstructive pulmonary disease

## 2019-04-08 ENCOUNTER — Ambulatory Visit (INDEPENDENT_AMBULATORY_CARE_PROVIDER_SITE_OTHER): Payer: Managed Care, Other (non HMO) | Admitting: Ophthalmology

## 2019-04-08 ENCOUNTER — Encounter (INDEPENDENT_AMBULATORY_CARE_PROVIDER_SITE_OTHER): Payer: Self-pay | Admitting: Ophthalmology

## 2019-04-08 ENCOUNTER — Other Ambulatory Visit: Payer: Self-pay

## 2019-04-08 DIAGNOSIS — H3581 Retinal edema: Secondary | ICD-10-CM

## 2019-04-08 DIAGNOSIS — H3343 Traction detachment of retina, bilateral: Secondary | ICD-10-CM

## 2019-04-08 DIAGNOSIS — H4313 Vitreous hemorrhage, bilateral: Secondary | ICD-10-CM

## 2019-04-08 DIAGNOSIS — H4921 Sixth [abducent] nerve palsy, right eye: Secondary | ICD-10-CM

## 2019-04-08 DIAGNOSIS — H35033 Hypertensive retinopathy, bilateral: Secondary | ICD-10-CM

## 2019-04-08 DIAGNOSIS — Z961 Presence of intraocular lens: Secondary | ICD-10-CM

## 2019-04-08 DIAGNOSIS — I10 Essential (primary) hypertension: Secondary | ICD-10-CM

## 2019-04-08 DIAGNOSIS — E113513 Type 2 diabetes mellitus with proliferative diabetic retinopathy with macular edema, bilateral: Secondary | ICD-10-CM

## 2019-04-08 MED ORDER — PREDNISOLONE ACETATE 1 % OP SUSP: 1.0000 [drp] | Freq: Four times a day (QID) | OPHTHALMIC | 0 refills | Status: DC

## 2019-04-10 ENCOUNTER — Encounter (INDEPENDENT_AMBULATORY_CARE_PROVIDER_SITE_OTHER): Payer: Self-pay | Admitting: Ophthalmology

## 2019-04-12 ENCOUNTER — Ambulatory Visit (HOSPITAL_COMMUNITY): Payer: Managed Care, Other (non HMO)

## 2019-04-12 ENCOUNTER — Telehealth (HOSPITAL_COMMUNITY): Payer: Self-pay

## 2019-04-12 NOTE — Telephone Encounter (Signed)
pty called to cancel both of his appts for this week due to his doctor told him to wait untill next weeki to resume therapy.

## 2019-04-14 ENCOUNTER — Ambulatory Visit (HOSPITAL_COMMUNITY): Payer: Managed Care, Other (non HMO)

## 2019-04-15 ENCOUNTER — Other Ambulatory Visit: Payer: Self-pay

## 2019-04-15 ENCOUNTER — Encounter: Payer: Managed Care, Other (non HMO) | Attending: Internal Medicine | Admitting: Dietician

## 2019-04-15 DIAGNOSIS — Z794 Long term (current) use of insulin: Secondary | ICD-10-CM | POA: Insufficient documentation

## 2019-04-15 DIAGNOSIS — E1165 Type 2 diabetes mellitus with hyperglycemia: Secondary | ICD-10-CM

## 2019-04-15 DIAGNOSIS — N184 Chronic kidney disease, stage 4 (severe): Secondary | ICD-10-CM

## 2019-04-15 NOTE — Patient Instructions (Signed)
Pick up the [employer] from [place].  If you have any problems with this being covered by your insurance, putting it on or getting this to work, please call my office- [phone].  Continue to take your insulin as ordered. Continue to check your blood sugar with a meter or Libre. Control your blood sugar to help your eyes and kidneys and avoid other complications.  Follow a low sodium diet. Avoid any products with Phos... in the ingredient list.  Avoid dark soda. Avoid any seasonings with Potassium in the ingredient list. No need to restrict potassium unless your potassium is elevated or as directed by your kidney doctor. Keep meat portions small.  Plat protein is easier on your kidneys. Choose lean meat. Drink beverages without carbohydrate/sugar.   Cooking more at home is a good idea and can help reduce the amount of sodium in your diet.  Resources: [email or web address] [email or web address]

## 2019-04-15 NOTE — Progress Notes (Signed)
Medical Nutrition Therapy:  Appt start time: 1330 end time:  0923.   Assessment:  Primary concerns today:  Patient is here today with his son (who is in grad school at [employer]).  He has had recent eye surgery and his vision is poor.  He wishes to learn more about healthy eating for diabetes and about the [employer]. He was referred for type 2 diabetes and CKD.  He has his first appointment with a nephrologist soon.  His vision is very poor post surgeries for retina problems and cateracts in both eyes. Last saw Dr. Kelton Pillar 11/17/2018 via telehealth.  History includes type 2 diabetes dx in 2000, HTN, stage 4 CKD. He checks his BG 3 times daily.  Fasting 200-220 often and on a good day 135,  145-150 before lunch, 150-160 before supper. Medications include:  Tresiba 22 units q HS and Humalog 7 units before breakfast, 6 units before lunch and dinner.  States that he was bedridden for about a month and a half at the beginning of the year.  He has a history of uncontrolled type 2 diabetes and an abscess on his collar bone.  The abscess was removed, wound vac and this had a problem healing.   A1C was 7.7% 02/15/19 decreased from 13% 08/16/2018 and peaked at 16.8% in 2017. Labs include:  eGFR 19, BUN 49, Creatinine 3.52, Potassium 3.9 03/31/19 Weights today 176 lbs decreased from 200 lbs in 2019 and high weight of 230 lbs.  Patient lives with his wife, son, and daughter and father-in-law.  He is currently on disability.  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  Preferred Learning Style:   No preference indicated   Learning Readiness:   Ready  Change in progress   DIETARY INTAKE:  Usual eating pattern includes 3 meals and 0 snacks per day. States that he uses a little salt at times. 24-hr recall:  B ( AM): cereal (cornflakes), [employer] whole milk OR egg, toast at times  Snk ( AM): none  L ( PM): burger or sandwich, and salad out to eat often Snk (  PM): none D ( PM): baked chicken, baked potato with pepper, and small amount of salt, salad, balsamic dressing, 2 onion rings Snk ( PM): none Beverages: water, diet coke, sweet tea once per week  Usual physical activity: walking (currently walking with a cain and will resume PT next week).  Estimated energy needs: 2200 calories 65 g protein  Progress Towards Goal(s):  In progress.   Nutritional Diagnosis:  NB-1.1 Food and nutrition-related knowledge deficit As related to balance of carbohydrate, protein, and fat as well as renal diet.  As evidenced by per patient report and diet hx.    Intervention:  Nutrition education completed related to a renal ADA diet.  Low in protein as he is not on dialysis, low in sodium, and no potassium restriction at this time until labs indicate restriction.  Discussed changing his beverages to avoid those high in phosphorous and to avoid foods with added phosphorous. Discussed kidney resources and balance of meals and simple meal planning. Instructed him on the [facility] and discussed that the prescription is at a pharmacy near him.  Discussed that if insurance does not cover this to contact me.  Provided the app that he can have put onto his phone to read the sensor or to call Dr. Kelton Pillar for a prescription for a reader if needed. Pick up the [employer]  from [place].  If you have any problems with this being covered by your insurance, putting it on or getting this to work, please call my office- [phone].  Continue to take your insulin as ordered. Continue to check your blood sugar with a meter or Libre. Control your blood sugar to help your eyes and kidneys and avoid other complications.  Follow a low sodium diet. Avoid any products with Phos... in the ingredient list.  Avoid dark soda. Avoid any seasonings with Potassium in the ingredient list. No need to restrict potassium unless your potassium is elevated or as directed by your  kidney doctor. Keep meat portions small.  Plat protein is easier on your kidneys. Choose lean meat. Drink beverages without carbohydrate/sugar.   Cooking more at home is a good idea and can help reduce the amount of sodium in your diet.  Resources: [email or web address]  Teaching Method Utilized:  Visual Auditory  Handouts given during visit include:  How to Thrive:  A Guide for your journey with diabetes  Dish up a Kidney-Friendly Meal (National Kidney diet for patient's not on dialysis)   Barriers to learning/adherence to lifestyle change: poor vision, relies on family for transportation  Demonstrated degree of understanding via:  Teach Back   Monitoring/Evaluation:  Dietary intake, exercise, and body weight prn.

## 2019-04-20 ENCOUNTER — Ambulatory Visit (HOSPITAL_COMMUNITY): Payer: Managed Care, Other (non HMO) | Attending: Family Medicine

## 2019-04-20 ENCOUNTER — Encounter: Payer: Self-pay | Admitting: Podiatry

## 2019-04-20 ENCOUNTER — Encounter (HOSPITAL_COMMUNITY): Payer: Self-pay

## 2019-04-20 ENCOUNTER — Ambulatory Visit (INDEPENDENT_AMBULATORY_CARE_PROVIDER_SITE_OTHER): Payer: Managed Care, Other (non HMO) | Admitting: Podiatry

## 2019-04-20 ENCOUNTER — Other Ambulatory Visit: Payer: Self-pay

## 2019-04-20 DIAGNOSIS — M79674 Pain in right toe(s): Secondary | ICD-10-CM | POA: Diagnosis not present

## 2019-04-20 DIAGNOSIS — M79675 Pain in left toe(s): Secondary | ICD-10-CM | POA: Diagnosis not present

## 2019-04-20 DIAGNOSIS — M76821 Posterior tibial tendinitis, right leg: Secondary | ICD-10-CM | POA: Diagnosis not present

## 2019-04-20 DIAGNOSIS — R262 Difficulty in walking, not elsewhere classified: Secondary | ICD-10-CM | POA: Insufficient documentation

## 2019-04-20 DIAGNOSIS — M76822 Posterior tibial tendinitis, left leg: Secondary | ICD-10-CM

## 2019-04-20 DIAGNOSIS — B351 Tinea unguium: Secondary | ICD-10-CM | POA: Diagnosis not present

## 2019-04-20 DIAGNOSIS — E1140 Type 2 diabetes mellitus with diabetic neuropathy, unspecified: Secondary | ICD-10-CM | POA: Insufficient documentation

## 2019-04-20 DIAGNOSIS — M6281 Muscle weakness (generalized): Secondary | ICD-10-CM | POA: Diagnosis not present

## 2019-04-20 DIAGNOSIS — E1142 Type 2 diabetes mellitus with diabetic polyneuropathy: Secondary | ICD-10-CM

## 2019-04-20 NOTE — Progress Notes (Signed)
This patient presents to the office with chief complaint of long thick nails both great toes  and diabetic feet.  This patient  says there  is  no pain and discomfort in his  feet.  This patient says there are long thick painful nails both great toes..  These nails are painful walking and wearing shoes.  Patient has no history of infection or drainage from both feet.  Patient is unable to  self treat his own nails . This patient presents  to the office today for treatment of the  long nails and a foot evaluation due to history of  diabetes.  General Appearance  Alert, conversant and in no acute stress.  Vascular  Dorsalis pedis and posterior tibial  pulses are palpable  bilaterally.  Capillary return is within normal limits  bilaterally. Temperature is within normal limits  bilaterally.  Neurologic  Senn-Weinstein monofilament wire test within normal limits left foot.  Diminished/absent LOPS right foot. Muscle power within normal limits bilaterally.  Nails Thick disfigured discolored nails with subungual debris  Hallux nails  B/L. No evidence of bacterial infection or drainage bilaterally.  Orthopedic  No limitations of motion of motion feet .  No crepitus or effusions noted.  No bony pathology or digital deformities noted. DJD 1st MPJ  Right foot.  PTTD  B/L.  Skin  normotropic skin with no porokeratosis noted bilaterally.  No signs of infections or ulcers noted.     Onychomycosis  Diabetes with neuropathy  IE  Debride nails x 10.  A diabetic foot exam was performed and there is no evidence of any vascular  pathology. LOPS right foot diminished/absent.  RTC 3 months.   Gardiner Barefoot DPM

## 2019-04-20 NOTE — Progress Notes (Signed)
[facility] Note  04/22/2019     CHIEF COMPLAINT Patient presents for Retina Follow Up   HISTORY OF PRESENT ILLNESS: Randy Oneal is a 53 y.o. male who presents to the clinic today for:   HPI    Retina Follow Up    Patient presents with  Diabetic Retinopathy.  In both eyes.  Severity is moderate.  Duration of 2 weeks.  Since onset it is stable.  I, the attending physician,  performed the HPI with the patient and updated documentation appropriately.          Comments    Patient states vision the same OU. BS was 206 this am. Last a1c was 7.7 in July 2020. Patient taking Pred Forte 4 times daily, brimonidine once daily, and polytrim ointment at bedtime in both eyes. Running low on gtts/ung.        Last edited by Bernarda Caffey, MD on 04/22/2019 10:45 AM. (History)    Pt states he is doing well today, he states his eye is not scratchy today   Referring physician: [name], MD [place],  [place] [address]  HISTORICAL INFORMATION:   Selected notes from the MEDICAL RECORD NUMBER Referred by Dr.Mark Gershon Crane for concern of decreased vision post cataract sx LEE:  Ocular Hx- PMH-   CURRENT MEDICATIONS: Current Outpatient Medications (Ophthalmic Drugs)  Medication Sig  . bacitracin-polymyxin b (POLYSPORIN) ophthalmic ointment Place 1 application into both eyes at bedtime.  . prednisoLONE acetate (PRED FORTE) 1 % ophthalmic suspension Place 1 drop into both eyes 4 (four) times daily.  Marland Kitchen atropine 1 % ophthalmic solution Place 1 drop into the left eye 2 (two) times daily.  . brimonidine (ALPHAGAN) 0.2 % ophthalmic solution Place into both eyes daily.   . Bromfenac Sodium (PROLENSA) 0.07 % SOLN Place 1 drop into both eyes 4 (four) times daily.  . dorzolamide-timolol (COSOPT) 22.3-6.8 MG/ML ophthalmic solution Place 1 drop into the right eye daily.   Marland Kitchen gatifloxacin (ZYMAXID) 0.5 % SOLN Place 1 drop into both eyes 4 (four) times  daily.   No current facility-administered medications for this visit.  (Ophthalmic Drugs)   Current Outpatient Medications (Other)  Medication Sig  . acetaminophen (TYLENOL) 500 MG tablet Take 1,000 mg by mouth every 6 (six) hours as needed for moderate pain or headache.  Marland Kitchen amLODipine (NORVASC) 10 MG tablet Take one tablet by mouth each day (Patient taking differently: Take 10 mg by mouth daily. )  . atorvastatin (LIPITOR) 10 MG tablet Take 1 tablet (10 mg total) by mouth daily at 6 PM. (Patient taking differently: Take 10 mg by mouth daily. )  . Continuous Blood Gluc Sensor (FREESTYLE LIBRE SENSOR SYSTEM) MISC Use one sensor every 10 days.  . famotidine (PEPCID) 40 MG tablet Take one tablet by mouth daily (Patient taking differently: Take 40 mg by mouth daily as needed for heartburn. )  . glucose blood (BAYER CONTOUR NEXT TEST) test strip USE TO TEST FOUR TIMES DAILY.  Marland Kitchen insulin lispro (HUMALOG KWIKPEN) 100 UNIT/ML KwikPen Inject 0.06 mLs (6 Units total) into the skin daily with breakfast AND 0.07 mLs (7 Units total) 2 (two) times daily before lunch and supper. (Patient taking differently: inject 5-9 unit with each meal)  . Insulin Pen Needle (BD PEN NEEDLE NANO 2ND GEN) 32G X 4 MM MISC Four times daily  . metoprolol tartrate (LOPRESSOR) 50 MG tablet Take one half tablet by mouth twice daily (  Patient taking differently: Take 25 mg by mouth 2 (two) times daily. )  . MICROLET LANCETS MISC USE FOUR TIMES A DAY AS DIRECTED.  Marland Kitchen nystatin (MYCOSTATIN/NYSTOP) powder Apply topically 2 (two) times daily. To groin and scrotum (Patient taking differently: Apply 1 g topically 2 (two) times daily as needed (rash). To groin and scrotum)  . pantoprazole (PROTONIX) 40 MG tablet Take 1 tablet (40 mg total) by mouth daily.  . sildenafil (REVATIO) 20 MG tablet May take up to 5 before relations as directed  . Simethicone (GAS-X PO) Take 1 tablet by mouth daily as needed (gas).  . TRESIBA FLEXTOUCH 100 UNIT/ML SOPN  FlexTouch Pen Inject 0.19 mLs (19 Units total) into the skin at bedtime. (Patient taking differently: Inject 22 Units into the skin at bedtime. )   No current facility-administered medications for this visit.  (Other)      REVIEW OF SYSTEMS: ROS    Positive for: Endocrine, Eyes   Negative for: Constitutional, Gastrointestinal, Neurological, Skin, Genitourinary, Musculoskeletal, HENT, Cardiovascular, Respiratory, Psychiatric, Allergic/Imm, Heme/Lymph   Last edited by Roselee Nova D, COT on 04/22/2019  9:51 AM. (History)       ALLERGIES Allergies  Allergen Reactions  . Tramadol Nausea And Vomiting    PAST MEDICAL HISTORY Past Medical History:  Diagnosis Date  . Diabetes mellitus    Type 2   . Diabetic retinopathy of both eyes ([place]) 01/31/2019   Dr Coralyn Pear 01/2019  . Hypertension   . Renal insufficiency    ckd stage 4  . Retinal detachment    OS  . Sepsis due to Streptococcus, group B ([place]) 10/05/2018   Past Surgical History:  Procedure Laterality Date  . [place] VITRECTOMY WITH 20 GAUGE MVR PORT FOR MACULAR HOLE Right 02/24/2019   Procedure: 25 GAUGE PARS PLANA VITRECTOMY WITH 20 GAUGE MVR PORT FOR MACULAR HOLE;  Surgeon: Bernarda Caffey, MD;  Location: [facility];  Service: Ophthalmology;  Laterality: Right;  . APPLICATION OF WOUND VAC Left 08/27/2018   Procedure: APPLICATION OF WOUND VAC, left neck;  Surgeon: Gaye Pollack, MD;  Location: [facility];  Service: Thoracic;  Laterality: Left;  . APPLICATION OF WOUND VAC Left 08/30/2018   Procedure: APPLICATION OF WOUND VAC;  Surgeon: Gaye Pollack, MD;  Location: [facility];  Service: Thoracic;  Laterality: Left;  . CATARACT EXTRACTION Bilateral   . CATARACT EXTRACTION W/ INTRAOCULAR LENS  IMPLANT, BILATERAL    . COLONOSCOPY  06/24/2011   Procedure: COLONOSCOPY;  Surgeon: [name], MD;  [facility];  Service: Endoscopy;  Laterality: N/A;  8:30 AM  . EYE SURGERY    . GAS/FLUID EXCHANGE Left 03/31/2019    Procedure: Gas/Fluid Exchange;  Surgeon: Bernarda Caffey, MD;  Location: [facility];  Service: Ophthalmology;  Laterality: Left;  . INJECTION OF SILICONE OIL  2/67/1245   Procedure: Injection Of Silicone Oil;  Surgeon: Bernarda Caffey, MD;  Location: [facility];  Service: Ophthalmology;;  . MEMBRANE PEEL Right 02/24/2019   Procedure: Antoine Primas;  Surgeon: Bernarda Caffey, MD;  Location: [facility];  Service: Ophthalmology;  Laterality: Right;  . PARS PLANA VITRECTOMY Left 03/31/2019   Procedure: PARS PLANA VITRECTOMY WITH 25 GAUGE WITH MEMBRANE PEEL;  Surgeon: Bernarda Caffey, MD;  Location: [facility];  Service: Ophthalmology;  Laterality: Left;  . PHOTOCOAGULATION WITH LASER Right 02/24/2019   Procedure: Photocoagulation With Laser;  Surgeon: Bernarda Caffey, MD;  Location: [facility];  Service: Ophthalmology;  Laterality: Right;  . PHOTOCOAGULATION WITH  LASER Left 03/31/2019   Procedure: Photocoagulation With Laser;  Surgeon: Bernarda Caffey, MD;  Location: [facility];  Service: Ophthalmology;  Laterality: Left;  . RETINAL DETACHMENT SURGERY Left 03/31/2019   TRD Repair - Dr. Bernarda Caffey  . SILICON OIL REMOVAL Right 10/22/9700   Procedure: Silicon Oil Removal;  Surgeon: Bernarda Caffey, MD;  Location: [facility];  Service: Ophthalmology;  Laterality: Right;  . STERNAL WOUND DEBRIDEMENT Left 08/27/2018   Procedure: Incision and DEBRIDEMENT Left Chest, Neck and Mediastinum;  Surgeon: Gaye Pollack, MD;  Location: [facility] OR;  Service: Thoracic;  Laterality: Left;  . STERNAL WOUND DEBRIDEMENT Left 08/30/2018   Procedure: WOUND VAC CHANGE, LEFT CHEST AND NECK, POSSIBLE DEBRIDEMENT;  Surgeon: Gaye Pollack, MD;  Location: [facility];  Service: Thoracic;  Laterality: Left;    FAMILY HISTORY Family History  Problem Relation Age of Onset  . Hypertension Mother   . Colon cancer Neg Hx     SOCIAL HISTORY Social History   Tobacco Use  . Smoking status: Never Smoker  . Smokeless tobacco: Never Used  Substance Use Topics  . Alcohol use: No  . Drug  use: No         OPHTHALMIC EXAM:  Base Eye Exam    Visual Acuity (Snellen - Linear)      Right Left   Dist [place] CF at face 20/250   Dist ph [place] 20/300 20/150       Tonometry (Tonopen, 10:06 AM)      Right Left   Pressure 14 14       Pupils      Dark Light Shape React APD   Right 7 7 Round None None   Left 7 7 Round None None       Visual Fields (Counting fingers)      Left Right    Full    Restrictions  Partial outer superior temporal, inferior temporal deficiencies  RET, CN VI palsy       Extraocular Movement      Right Left    -4 0 0  -4  0  -4 0 0   0 0 0  0  0  0 0 0         Neuro/Psych    Oriented x3: Yes   Mood/Affect: Normal       Dilation    Both eyes: 1.0% Mydriacyl, 2.5% Phenylephrine @ 10:06 AM        Slit Lamp and Fundus Exam    Slit Lamp Exam      Right Left   Lids/Lashes mild Meibomian gland dysfunction mild Meibomian gland dysfunction   Conjunctiva/Sclera Improved subconj silicon oil, sutures intact, Subconjunctival hemorrhage, 1+ Injection, Chemosis Mild residual Subconjunctival hemorrhage temporally, sutures intact   Cornea 2-3+ inferior PEE, Well healed cataract wounds Clear, Well healed cataract wounds   Anterior Chamber Deep, clear Deep and quiet   Iris slightly irregular, dilated, posterior synchiae @ 2:00, 3:00-10:30 Round and dilated   Lens PC IOL in good position, 1+ PCO PC IOL in good position   Vitreous Post vit, silicon oil bubble ~63-78%. post vitrectomy, good silicone oil bubble fill       Fundus Exam      Right Left   Disc 2-3+pallor, sharp rim, central heme resolved 0.5+pallor, improved fibrosis   C/D Ratio 0.4 0.4   Macula Flat under oil, Blunted foveal reflex, scattered Microaneurysms, central CME Flat under oil, blunted foveal reflex, reattachment of focal RD with good  laser around hole, fibrosis vastly improved, central CME   Vessels attenuated; tortuous; improved fibrosis -  mild fibrotic remnants attenuated;  fibrosis along IT arcades improved   Periphery Attached, good 360 PRP laser, mild residual shallow SRF inferiorly -- improving, fibrosis along ST arcades improved under oil Retina re-Attached, focal detachment resolved under oil -- good laser surrounding, good 360 PRP laser changes, improved fibrosis          IMAGING AND PROCEDURES  Imaging and Procedures for '@TODAY'$ @  OCT, Retina - OU - Both Eyes       Right Eye Quality was good. Central Foveal Thickness: 702. Progression has worsened. Findings include preretinal fibrosis, abnormal foveal contour, subretinal fluid, outer retinal atrophy, epiretinal membrane, intraretinal fluid (Central CME, trace residual pockets of SRF).   Left Eye Quality was good. Central Foveal Thickness: 602. Progression has worsened. Findings include vitreous traction, preretinal fibrosis, intraretinal fluid, abnormal foveal contour, intraretinal hyper-reflective material, epiretinal membrane, subretinal fluid (Interval increase in IRF).   Notes *Images captured and stored on drive  Diagnosis / Impression:  OD: Macula reattached under oil, central CME, trace residual pockets of SRF OS: interval increase in IRF   Clinical management:  See below  Abbreviations: NFP - Normal foveal profile. CME - cystoid macular edema. PED - pigment epithelial detachment. IRF - intraretinal fluid. SRF - subretinal fluid. EZ - ellipsoid zone. ERM - epiretinal membrane. ORA - outer retinal atrophy. ORT - outer retinal tubulation. SRHM - subretinal hyper-reflective material        Intravitreal Injection, Pharmacologic Agent - OD - Right Eye       Time Out 04/22/2019. 11:42 AM. Confirmed correct patient, procedure, site, and patient consented.   Anesthesia Topical anesthesia was used. Anesthetic medications included Lidocaine 2%, Proparacaine 0.5%.   Procedure Preparation included eyelid speculum, 5% betadine to ocular surface. A 30 gauge needle was used.   Injection:   1.25 mg Bevacizumab (AVASTIN) SOLN   NDC: 48016-553-74, Lot: [id number]'$ , Expiration date: 06/21/2019   Route: Intravitreal, Site: Right Eye, Waste: 0 mL  Post-op Post injection exam found visual acuity of at least counting fingers. The patient tolerated the procedure well. There were no complications. The patient received written and verbal post procedure care education.        Intravitreal Injection, Pharmacologic Agent - OS - Left Eye       Time Out 04/22/2019. 11:43 AM. Confirmed correct patient, procedure, site, and patient consented.   Anesthesia Topical anesthesia was used. Anesthetic medications included Lidocaine 2%, Proparacaine 0.5%.   Procedure Preparation included 5% betadine to ocular surface. A 30 gauge needle was used.   Injection:  1.25 mg Bevacizumab (AVASTIN) SOLN   NDC: 22/06/2019, Lot: [id number]'$ , Expiration date: 07/28/2019   Route: Intravitreal, Site: Right Eye, Waste: 0 mL  Post-op Post injection exam found visual acuity of at least counting fingers. The patient tolerated the procedure well. There were no complications. The patient received written and verbal post procedure care education.                 ASSESSMENT/PLAN:    ICD-10-CM   1. Proliferative diabetic retinopathy of both eyes with macular edema associated with type 2 diabetes mellitus (HCC)  [id number] Intravitreal Injection, Pharmacologic Agent - OD - Right Eye    Intravitreal Injection, Pharmacologic Agent - OS - Left Eye    Bevacizumab (AVASTIN) SOLN 1.25 mg    Bevacizumab (AVASTIN) SOLN 1.25 mg  2. Retinal detachment, tractional, bilateral  H33.43  3. 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 Vitreous hemorrhage of both eyes ([place])  H43.13   5. Retinal edema  H35.81 OCT, Retina - OU - Both Eyes  6. Essential hypertension  I10   7. Hypertensive retinopathy of both eyes  H35.033   8. Pseudophakia of both eyes  Z96.1     1-5. Proliferative diabetic retinopathy with  DME, TRD, and vitreous hemorrhage OU (OD > OS)  - pt with complex medical history with hospitalization in Jan-Feb 2020 for bacteremia and abscess  - history of poor glycemic control for years  - s/p IVA OU #1 (06.02.20), #2 (07.01.20)  - s/p PRP OS (06.10.20)  - exam shows diffuse vitreous and preretinal hemorrhage + scattered fibrosis OU   - pre-op clearing of VH OD has improved visualization of posterior pole --+macular TRD OD; and ?regressing NVD OS  - pre-op OCT shows preretinal fibrosis w/ traction OU (OD > OS)  - s/p PPV/PFC/EL/FAX/silicone oil OD, 58.09.98  - s/p subconj silicon oil removal OD 8.20.20  - now POW3 s/p PPV/EL/FAX/silicone oil OS, 33.82.50             - doing well   - BCVA OD 20/300; OS 53/976  - subconj silicon oil prolapse OD -- improved today             - OD: improved fibrosis, retina attached and in good position under oil, central CME, trace residual pockets of SRF  - OD with post op CN VI palsy / abduction deficit -- monitor -- may need further eval once stable post op  - OS: improved fibrosis, focal RD reattached under oil; interval increase in IRF  - recommend IVA OU #3 today (09.11.20) for increase in IRF OU (CME vs DME)             - IOP 14 OU             - cont PF to 4x/day OU                         brimonidine QD daily OU                                                 PSO ung QID OU  - start  Prolensa QID OU             - cont face down 50% of time; avoid laying flat on back             - post op drop and positioning instructions reviewed             - tylenol/ibuprofen for pain  - f/u 4 weeks  6,7. Hypertensive retinopathy OU  - discussed importance of tight BP control  - monitor  8. Pseudophakia OU  - s/p CE/IOL OU (Dr. Gershon Crane)   Ophthalmic Meds Ordered this visit:  Meds ordered this encounter  Medications  . Bromfenac Sodium (PROLENSA) 0.07 % SOLN    Sig: Place 1 drop into both eyes 4 (four) times daily.    Dispense:  3 mL     Refill:  3  . bacitracin-polymyxin b (POLYSPORIN) ophthalmic ointment    Sig: Place 1 application into both eyes at bedtime.    Dispense:  3.5 g    Refill:  3  . Bevacizumab (AVASTIN) SOLN 1.25 mg  . Bevacizumab (AVASTIN) SOLN 1.25 mg       Return in about 4 weeks (around 05/20/2019) for POV.  There are no Patient Instructions on file for this visit.   Explained the diagnoses, plan, and follow up with the patient and they expressed understanding.  Patient expressed understanding of the importance of proper follow up care.   This document serves as a record of services personally performed by Gardiner Sleeper, MD, PhD. It was created on their behalf by Roselee Nova, COMT. The creation of this record is the provider's dictation and/or activities during the visit.  Electronically signed by: Roselee Nova, COMT 04/25/19 12:44 AM    Gardiner Sleeper, M.D., Ph.D. Diseases & Surgery of the Retina and Vitreous [facility]  I have reviewed the above documentation for accuracy and completeness, and I agree with the above. Gardiner Sleeper, M.D., Ph.D. 04/25/19 12:46 AM    Abbreviations: M myopia (nearsighted); A astigmatism; H hyperopia (farsighted); P presbyopia; Mrx spectacle prescription;  CTL contact lenses; OD right eye; OS left eye; OU both eyes  XT exotropia; ET esotropia; PEK punctate epithelial keratitis; PEE punctate epithelial erosions; DES dry eye syndrome; MGD meibomian gland dysfunction; ATs artificial tears; PFAT's preservative free artificial tears; [place] nuclear sclerotic cataract; PSC posterior subcapsular cataract; ERM epi-retinal membrane; PVD posterior vitreous detachment; RD retinal detachment; DM diabetes mellitus; DR diabetic retinopathy; NPDR non-proliferative diabetic retinopathy; PDR proliferative diabetic retinopathy; CSME clinically significant macular edema; DME diabetic macular edema; dbh dot blot hemorrhages; CWS cotton wool spot; POAG primary  open angle glaucoma; C/D cup-to-disc ratio; HVF humphrey visual field; GVF goldmann visual field; OCT optical coherence tomography; IOP intraocular pressure; BRVO Branch retinal vein occlusion; CRVO central retinal vein occlusion; CRAO central retinal artery occlusion; BRAO branch retinal artery occlusion; RT retinal tear; SB scleral buckle; PPV pars plana vitrectomy; VH Vitreous hemorrhage; PRP panretinal laser photocoagulation; IVK intravitreal kenalog; VMT vitreomacular traction; MH Macular hole;  NVD neovascularization of the disc; NVE neovascularization elsewhere; AREDS age related eye disease study; ARMD age related macular degeneration; POAG primary open angle glaucoma; EBMD epithelial/anterior basement membrane dystrophy; ACIOL anterior chamber intraocular lens; IOL intraocular lens; PCIOL posterior chamber intraocular lens; Phaco/IOL phacoemulsification with intraocular lens placement; [place] photorefractive keratectomy; LASIK laser assisted in situ keratomileusis; HTN hypertension; DM diabetes mellitus; COPD chronic obstructive pulmonary disease

## 2019-04-20 NOTE — Therapy (Signed)
[place], [place], [address] Phone: [phone]   Fax:  [phone]  Physical Therapy Treatment  Patient Details  Name: Randy Oneal MRN: [id number] Date of Birth: 08-02-1966 Referring Provider (PT): Sallee Lange    Encounter Date: 04/20/2019  PT End of Session - 04/20/19 1438    Visit Number  14    Number of Visits  20    Date for PT Re-Evaluation  04/21/19    Authorization Type  [employer] (20 visit limit per calander year, 0 used at eval, no auth required)    Authorization Time Period  02/16/6282-6/62/94; new cert 7/65/46 to 12/11/52    Authorization - Visit Number  14    Authorization - Number of Visits  20    PT Start Time  6568   patient arrived late   PT Stop Time  1443    PT Time Calculation (min)  28 min    Activity Tolerance  Patient tolerated treatment well    Behavior During Therapy  [facility] for tasks assessed/performed       Past Medical History:  Diagnosis Date  . Diabetes mellitus    Type 2   . Diabetic retinopathy of both eyes ([place]) 01/31/2019   Dr Coralyn Pear 01/2019  . Hypertension   . Renal insufficiency    ckd stage 4  . Retinal detachment    OS  . Sepsis due to Streptococcus, group B ([place]) 10/05/2018    Past Surgical History:  Procedure Laterality Date  . [place] VITRECTOMY WITH 20 GAUGE MVR PORT FOR MACULAR HOLE Right 02/24/2019   Procedure: 25 GAUGE PARS PLANA VITRECTOMY WITH 20 GAUGE MVR PORT FOR MACULAR HOLE;  Surgeon: Bernarda Caffey, MD;  Location: [place];  Service: Ophthalmology;  Laterality: Right;  . APPLICATION OF WOUND VAC Left 08/27/2018   Procedure: APPLICATION OF WOUND VAC, left neck;  Surgeon: Gaye Pollack, MD;  Location: [place];  Service: Thoracic;  Laterality: Left;  . APPLICATION OF WOUND VAC Left 08/30/2018   Procedure: APPLICATION OF WOUND VAC;  Surgeon: Gaye Pollack, MD;  Location: [place];  Service: Thoracic;  Laterality: Left;  . CATARACT EXTRACTION  Bilateral   . CATARACT EXTRACTION W/ INTRAOCULAR LENS  IMPLANT, BILATERAL    . COLONOSCOPY  06/24/2011   Procedure: COLONOSCOPY;  Surgeon: [name], MD;  [facility];  Service: Endoscopy;  Laterality: N/A;  8:30 AM  . EYE SURGERY    . GAS/FLUID EXCHANGE Left 03/31/2019   Procedure: Gas/Fluid Exchange;  Surgeon: Bernarda Caffey, MD;  Location: [place];  Service: Ophthalmology;  Laterality: Left;  . INJECTION OF SILICONE OIL  09/06/5168   Procedure: Injection Of Silicone Oil;  Surgeon: Bernarda Caffey, MD;  Location: [place];  Service: Ophthalmology;;  . MEMBRANE PEEL Right 02/24/2019   Procedure: Antoine Primas;  Surgeon: Bernarda Caffey, MD;  Location: [place];  Service: Ophthalmology;  Laterality: Right;  . PARS PLANA VITRECTOMY Left 03/31/2019   Procedure: PARS PLANA VITRECTOMY WITH 25 GAUGE WITH MEMBRANE PEEL;  Surgeon: Bernarda Caffey, MD;  Location: [place];  Service: Ophthalmology;  Laterality: Left;  . PHOTOCOAGULATION WITH LASER Right 02/24/2019   Procedure: Photocoagulation With Laser;  Surgeon: Bernarda Caffey, MD;  Location: [place];  Service: Ophthalmology;  Laterality: Right;  . PHOTOCOAGULATION WITH LASER Left 03/31/2019   Procedure: Photocoagulation With Laser;  Surgeon: Bernarda Caffey, MD;  Location: [place];  Service: Ophthalmology;  Laterality: Left;  .  RETINAL DETACHMENT SURGERY Left 03/31/2019   TRD Repair - Dr. Bernarda Caffey  . SILICON OIL REMOVAL Right 12/27/8414   Procedure: Silicon Oil Removal;  Surgeon: Bernarda Caffey, MD;  Location: [place];  Service: Ophthalmology;  Laterality: Right;  . STERNAL WOUND DEBRIDEMENT Left 08/27/2018   Procedure: Incision and DEBRIDEMENT Left Chest, Neck and Mediastinum;  Surgeon: Gaye Pollack, MD;  Location: [facility];  Service: Thoracic;  Laterality: Left;  . STERNAL WOUND DEBRIDEMENT Left 08/30/2018   Procedure: WOUND VAC CHANGE, LEFT CHEST AND NECK, POSSIBLE DEBRIDEMENT;  Surgeon: Gaye Pollack, MD;  Location: [place];  Service: Thoracic;  Laterality:  Left;    There were no vitals filed for this visit.  Subjective Assessment - 04/20/19 1418    Subjective  Everything seems to be going smooth since eye surgery and he has another follow up on Friday.    Pertinent History  Randy Oneal presented to [facility] on 08/17/18 with signs of sepsis. He was transferred to [facility] where he underwent surgery on 08/27/18 for Lt sternoclavicular infection with abscess to Lt neck and mediastinum. The wound was debrided and wound vac changed again on 08/30/18. Pt was discharged on 09/05/18 with home care to manage wound vac and IV line. On 01/19/19 patient had his final follow up with his surgeon, Gaye Pollack, MD, and was discharged as wound was fully healed and no signs of infection were present any longer. He is to follow up with his PCP no, Sallee Lange, MD.    Limitations  Walking;Standing;Lifting    Currently in Pain?  No/denies        [facility] Adult PT Treatment/Exercise - 04/20/19 0001      Knee/Hip Exercises: Standing   Lateral Step Up  Both;1 set;10 reps;Hand Hold: 0;Step Height: 4"    Step Down  Both;1 set;10 reps;Hand Hold: 1;Step Height: 4"    Functional Squat  2 sets;15 reps    Functional Squat Limitations  no UE support    Gait Training  Gait with SPC for ~ 100' and cues for increaed step length; gait training with no device with tactile cues at Rt arm to facilitate appropriate timing with arm swing ~ 100 '        Balance Exercises - 04/20/19 1429      Balance Exercises: Standing   Tandem Stance  Eyes open;Foam/compliant surface;2 reps;30 secs   2x bil LE (alt foot align)       PT Education - 04/20/19 1437    Education Details  Educated on patient on limitations due to time as he was late and discussed importance of arriving on time to benefit from entire appointment. Educated on exercise form during session and to continue with daily walks for endurance.    Person(s) Educated  Patient    Methods  Explanation    Comprehension  Verbalized  understanding;Returned demonstration       PT Short Term Goals - 03/11/19 1638      PT SHORT TERM GOAL #1   Title  Patient will be independent with HEP, updated PRN, to improve functional strength to improve mobility and return to PLOF.    Baseline  7/31- going well    Time  2    Period  Weeks    Status  Achieved      PT SHORT TERM GOAL #2   Title  Patient will improve LE strength by 1 grade with MMT to indicate significant improvement in strength.    Baseline  7/31- flowsheet    Time  4    Period  Weeks    Status  On-going      PT SHORT TERM GOAL #3   Title  Patient will perform SLS for 10 seconds on bil LE to indicate improved glut med strangth and improved balance for stair mobility and gait.    Baseline  7/31- 3 seconds at best    Time  4      PT SHORT TERM GOAL #4   Title  Patient will ambulate at 0.8 m/s or faster with LRAD during 2 MWT to indicate improved endurance and gait.    Baseline  7/31- 2MWT 289ft    Time  4    Period  Weeks    Status  On-going        PT Long Term Goals - 03/11/19 1640      PT LONG TERM GOAL #1   Time  8    Period  Weeks    Status  On-going      PT LONG TERM GOAL #2   Title  Patient will ascend/descend 4 6" stairs with no hand rail at independent level with no overt LOB.    Time  8    Period  Weeks    Status  On-going      PT LONG TERM GOAL #3   Title  Patient will report no low back or posterior leg pain for 2 week period that is above 3/10 indicating improved activity tolerance.    Time  8    Period  Weeks    Status  On-going       Plan - 04/20/19 1710    Clinical Impression Statement  Session limited by time restraints as patient arrived late. Focused on strength training for LE with squats and step ups today. Patient did not require UE support for lateral step ups on 4" stair and no evidence of LOB noted. Gait training was performed with SPC at first with cues for increased step length, but noted limited arm swing while  using cane. Gait training transitioned to no assistive device and stepping with tactile cue at Rt arm to improve timing of arm swings. This resulted in improved step length and arm swing bil, pt will benefit from continued training with no device in future sessions.    Personal Factors and Comorbidities  Fitness;Comorbidity 2    Comorbidities  DM, HTN, recent sepsis with prolonged hospitalization and weight loss    Examination-Activity Limitations  Bathing;Stairs;Stand;Lift;Locomotion Level    Examination-Participation Restrictions  Laundry;Yard Work;Driving;Community Activity;Cleaning    Stability/Clinical Decision Making  Stable/Uncomplicated    Rehab Potential  Good    PT Frequency  2x / week    PT Duration  6 weeks    PT Treatment/Interventions  ADLs/Self Care Home Management;Aquatic Therapy;Cryotherapy;Electrical Stimulation;Moist Heat;Gait training;DME Instruction;Stair training;Functional mobility training;Therapeutic activities;Therapeutic exercise;Balance training;Neuromuscular re-education;Patient/family education;Manual techniques;Passive range of motion;Dry needling;Taping    PT Next Visit Plan  Re-assess next visi and determine if needs to continue or save last 5 visits. Follow up with MD on restrictions with lifting and forward bending (call on 03/18/19 from Dr Dairl Ponder office no bending forward and weight limit at 15#) Continue functional strengthening and balance training. No machine for strengthening until cleared by MD. Perform gait training with no device to facilitate arm swing.    PT Home Exercise Plan  Eval: clamshell with band, seated hamstring stretch  6/16:  standing heelraises, toeraises; 02/08/19: standing hip abd/ext infront  of sink/counter with RTB around thigh    Consulted and Agree with Plan of Care  Patient       Patient will benefit from skilled therapeutic intervention in order to improve the following deficits and impairments:  Abnormal gait, Decreased activity  tolerance, Decreased balance, Decreased mobility, Difficulty walking, Decreased endurance, Decreased coordination, Decreased strength, Pain, Postural dysfunction, Impaired flexibility  Visit Diagnosis: Muscle weakness (generalized)  Difficulty in walking, not elsewhere classified     Problem List Patient Active Problem List   Diagnosis Date Noted  . Pain due to onychomycosis of toenails of both feet 04/20/2019  . Posterior tibial tendon dysfunction (PTTD) of both lower extremities 04/20/2019  . Diabetic retinopathy of both eyes ([place]) 01/31/2019  . Type 2 diabetes mellitus with hyperglycemia, with long-term current use of insulin ([place]) 10/20/2018  . Sepsis due to Streptococcus, group B ([place]) 10/05/2018  . Dietary counseling and surveillance 09/15/2018  . Non compliance with medical treatment 09/15/2018  . Open wound of right great toe   . Septic arthritis of left sternoclavicular joint ([place])   . Mediastinal abscess ([place]) 08/27/2018  . Cellulitis of great toe, right   . Diabetic hyperosmolar non-ketotic state ([place]) 08/17/2018  . Hyperglycemia 08/17/2018  . AKI (acute kidney injury) ([place]) 08/16/2018  . Hyponatremia 08/16/2018  . Hyperlipidemia associated with type 2 diabetes mellitus ([place]) 03/04/2017  . Erectile dysfunction 03/04/2017  . Personal history of noncompliance with medical treatment, presenting hazards to health 11/22/2015  . HTN (hypertension), benign 01/30/2014  . Loss of weight 01/30/2014  . Type 2 diabetes mellitus not at goal [facility]) 03/30/2013    Randy Oneal, PT, DPT, [name] Physical Therapist with [facility]  04/20/2019 5:16 PM    [facility] [address] [place], [place], [address] Phone: [phone]   Fax:  [phone]  Name: Randy Oneal MRN: [id number] Date of Birth: [birth year]

## 2019-04-22 ENCOUNTER — Ambulatory Visit (INDEPENDENT_AMBULATORY_CARE_PROVIDER_SITE_OTHER): Payer: Managed Care, Other (non HMO) | Admitting: Ophthalmology

## 2019-04-22 ENCOUNTER — Other Ambulatory Visit: Payer: Self-pay

## 2019-04-22 ENCOUNTER — Ambulatory Visit (HOSPITAL_COMMUNITY): Payer: Managed Care, Other (non HMO)

## 2019-04-22 ENCOUNTER — Telehealth (HOSPITAL_COMMUNITY): Payer: Self-pay

## 2019-04-22 ENCOUNTER — Encounter (INDEPENDENT_AMBULATORY_CARE_PROVIDER_SITE_OTHER): Payer: Self-pay | Admitting: Ophthalmology

## 2019-04-22 DIAGNOSIS — H3581 Retinal edema: Secondary | ICD-10-CM

## 2019-04-22 DIAGNOSIS — E113513 Type 2 diabetes mellitus with proliferative diabetic retinopathy with macular edema, bilateral: Secondary | ICD-10-CM | POA: Diagnosis not present

## 2019-04-22 DIAGNOSIS — Z961 Presence of intraocular lens: Secondary | ICD-10-CM

## 2019-04-22 DIAGNOSIS — H35033 Hypertensive retinopathy, bilateral: Secondary | ICD-10-CM

## 2019-04-22 DIAGNOSIS — H4313 Vitreous hemorrhage, bilateral: Secondary | ICD-10-CM

## 2019-04-22 DIAGNOSIS — H3343 Traction detachment of retina, bilateral: Secondary | ICD-10-CM

## 2019-04-22 DIAGNOSIS — H4921 Sixth [abducent] nerve palsy, right eye: Secondary | ICD-10-CM

## 2019-04-22 DIAGNOSIS — I10 Essential (primary) hypertension: Secondary | ICD-10-CM

## 2019-04-22 MED ORDER — BEVACIZUMAB CHEMO INJECTION 1.25MG/0.05ML SYRINGE FOR KALEIDOSCOPE
1.2500 mg | INTRAVITREAL | Status: AC | PRN
  Administered 2019-04-22: 1.25 mg via INTRAVITREAL

## 2019-04-22 MED ORDER — BACITRACIN-POLYMYXIN B 500-10000 UNIT/GM OP OINT: 1.0000 "application " | TOPICAL_OINTMENT | Freq: Every day | OPHTHALMIC | 3 refills | Status: DC

## 2019-04-22 MED ORDER — PROLENSA 0.07 % OP SOLN: 1.0000 [drp] | Freq: Four times a day (QID) | OPHTHALMIC | 3 refills | Status: DC

## 2019-04-22 NOTE — Telephone Encounter (Signed)
pt's dtr called to cancel this appt due to the pt had a procedure done to both of his eyes and he cannot see right now.

## 2019-04-25 ENCOUNTER — Other Ambulatory Visit (HOSPITAL_COMMUNITY): Payer: Self-pay | Admitting: Family Medicine

## 2019-05-02 ENCOUNTER — Other Ambulatory Visit (HOSPITAL_COMMUNITY): Payer: Self-pay | Admitting: Family Medicine

## 2019-05-04 ENCOUNTER — Other Ambulatory Visit (INDEPENDENT_AMBULATORY_CARE_PROVIDER_SITE_OTHER): Payer: Self-pay

## 2019-05-04 MED ORDER — BRIMONIDINE TARTRATE 0.2 % OP SOLN: 1.0000 [drp] | Freq: Every day | OPHTHALMIC | 1 refills | Status: DC

## 2019-05-16 ENCOUNTER — Other Ambulatory Visit (HOSPITAL_COMMUNITY): Payer: Self-pay | Admitting: Nephrology

## 2019-05-16 DIAGNOSIS — R809 Proteinuria, unspecified: Secondary | ICD-10-CM

## 2019-05-16 DIAGNOSIS — N184 Chronic kidney disease, stage 4 (severe): Secondary | ICD-10-CM

## 2019-05-16 DIAGNOSIS — R319 Hematuria, unspecified: Secondary | ICD-10-CM

## 2019-05-18 NOTE — Progress Notes (Signed)
[facility] Note  05/20/2019     CHIEF COMPLAINT Patient presents for Retina Follow Up   HISTORY OF PRESENT ILLNESS: Randy Oneal is a 53 y.o. male who presents to the clinic today for:   HPI    Retina Follow Up    Patient presents with  Diabetic Retinopathy.  In both eyes.  Severity is severe.  Duration of 4 weeks.  Since onset it is gradually improving.  I, the attending physician,  performed the HPI with the patient and updated documentation appropriately.          Comments    Patient states vision slightly improved OU. BS was 246 this am. Last a1c was 7.7 on 07.07.20.        Last edited by Debbrah Alar, COT on 05/20/2019  9:33 AM. (History)    Pt states he feels like his vision is improving   Referring physician: Rutherford Guys, [place],  [place] [address]  HISTORICAL INFORMATION:   Selected notes from the MEDICAL RECORD NUMBER Referred by Dr.Mark Gershon Crane for concern of decreased vision post cataract sx LEE:  Ocular Hx- PMH-   CURRENT MEDICATIONS: Current Outpatient Medications (Ophthalmic Drugs)  Medication Sig  . bacitracin-polymyxin b (POLYSPORIN) ophthalmic ointment Place 1 application into both eyes at bedtime. (Patient taking differently: Place 1 application into both eyes 4 (four) times daily. )  . brimonidine (ALPHAGAN) 0.2 % ophthalmic solution Place 1 drop into both eyes daily.  . Bromfenac Sodium (PROLENSA) 0.07 % SOLN Place 1 drop into both eyes 4 (four) times daily.  . prednisoLONE acetate (PRED FORTE) 1 % ophthalmic suspension Place 1 drop into both eyes 4 (four) times daily.  Marland Kitchen atropine 1 % ophthalmic solution Place 1 drop into the left eye 2 (two) times daily.  . dorzolamide-timolol (COSOPT) 22.3-6.8 MG/ML ophthalmic solution Place 1 drop into the right eye daily.   Marland Kitchen gatifloxacin (ZYMAXID) 0.5 % SOLN Place 1 drop into both eyes 4 (four) times daily.   No current facility-administered  medications for this visit.  (Ophthalmic Drugs)   Current Outpatient Medications (Other)  Medication Sig  . acetaminophen (TYLENOL) 500 MG tablet Take 1,000 mg by mouth every 6 (six) hours as needed for moderate pain or headache.  Marland Kitchen amLODipine (NORVASC) 10 MG tablet Take one tablet by mouth each day (Patient taking differently: Take 10 mg by mouth daily. )  . atorvastatin (LIPITOR) 10 MG tablet TAKE ONE TABLET BY MOUTH ONCE DAILY.  Marland Kitchen Continuous Blood Gluc Sensor (FREESTYLE LIBRE SENSOR SYSTEM) MISC Use one sensor every 10 days.  . famotidine (PEPCID) 40 MG tablet Take one tablet by mouth daily (Patient taking differently: Take 40 mg by mouth daily as needed for heartburn. )  . hydrALAZINE (APRESOLINE) 25 MG tablet Take 25 mg by mouth. 1/2 tablet 3 times daily  . insulin lispro (HUMALOG KWIKPEN) 100 UNIT/ML KwikPen Inject 0.06 mLs (6 Units total) into the skin daily with breakfast AND 0.07 mLs (7 Units total) 2 (two) times daily before lunch and supper. (Patient taking differently: inject 5-9 unit with each meal)  . Insulin Pen Needle (BD PEN NEEDLE NANO 2ND GEN) 32G X 4 MM MISC Four times daily  . metoprolol tartrate (LOPRESSOR) 50 MG tablet Take one half tablet by mouth twice daily (Patient taking differently: Take 25 mg by mouth 2 (two) times daily. )  . MICROLET LANCETS MISC USE FOUR TIMES A DAY AS DIRECTED.  Marland Kitchen  nystatin (MYCOSTATIN/NYSTOP) powder Apply topically 2 (two) times daily. To groin and scrotum (Patient taking differently: Apply 1 g topically 2 (two) times daily as needed (rash). To groin and scrotum)  . pantoprazole (PROTONIX) 40 MG tablet Take 1 tablet (40 mg total) by mouth daily.  . sildenafil (REVATIO) 20 MG tablet May take up to 5 before relations as directed  . Simethicone (GAS-X PO) Take 1 tablet by mouth daily as needed (gas).  . TRESIBA FLEXTOUCH 100 UNIT/ML SOPN FlexTouch Pen Inject 0.19 mLs (19 Units total) into the skin at bedtime. (Patient taking differently: Inject 22  Units into the skin at bedtime. )  . Vitamin D, Ergocalciferol, (DRISDOL) 1.25 MG (50000 UT) CAPS capsule Take 1.25 Units by mouth once a week.  Marland Kitchen glucose blood (BAYER CONTOUR NEXT TEST) test strip USE TO TEST FOUR TIMES DAILY.   No current facility-administered medications for this visit.  (Other)      REVIEW OF SYSTEMS: ROS    Positive for: Endocrine, Eyes   Negative for: Constitutional, Gastrointestinal, Neurological, Skin, Genitourinary, Musculoskeletal, HENT, Cardiovascular, Respiratory, Psychiatric, Allergic/Imm, Heme/Lymph   Last edited by Roselee Nova D, COT on 05/20/2019  8:44 AM. (History)       ALLERGIES Allergies  Allergen Reactions  . Tramadol Nausea And Vomiting    PAST MEDICAL HISTORY Past Medical History:  Diagnosis Date  . Diabetes mellitus    Type 2   . Diabetic retinopathy of both eyes ([place]) 01/31/2019   Dr Coralyn Pear 01/2019  . Hypertension   . Renal insufficiency    ckd stage 4  . Retinal detachment    OS  . Sepsis due to Streptococcus, group B ([place]) 10/05/2018   Past Surgical History:  Procedure Laterality Date  . [place] VITRECTOMY WITH 20 GAUGE MVR PORT FOR MACULAR HOLE Right 02/24/2019   Procedure: 25 GAUGE PARS PLANA VITRECTOMY WITH 20 GAUGE MVR PORT FOR MACULAR HOLE;  Surgeon: Bernarda Caffey, MD;  Location: [facility];  Service: Ophthalmology;  Laterality: Right;  . APPLICATION OF WOUND VAC Left 08/27/2018   Procedure: APPLICATION OF WOUND VAC, left neck;  Surgeon: Gaye Pollack, MD;  Location: [facility];  Service: Thoracic;  Laterality: Left;  . APPLICATION OF WOUND VAC Left 08/30/2018   Procedure: APPLICATION OF WOUND VAC;  Surgeon: Gaye Pollack, MD;  Location: [facility];  Service: Thoracic;  Laterality: Left;  . CATARACT EXTRACTION Bilateral   . CATARACT EXTRACTION W/ INTRAOCULAR LENS  IMPLANT, BILATERAL    . COLONOSCOPY  06/24/2011   Procedure: COLONOSCOPY;  Surgeon: [name], MD;  [facility];  Service: Endoscopy;   Laterality: N/A;  8:30 AM  . EYE SURGERY    . GAS/FLUID EXCHANGE Left 03/31/2019   Procedure: Gas/Fluid Exchange;  Surgeon: Bernarda Caffey, MD;  Location: [facility];  Service: Ophthalmology;  Laterality: Left;  . INJECTION OF SILICONE OIL  04/11/5175   Procedure: Injection Of Silicone Oil;  Surgeon: Bernarda Caffey, MD;  Location: [facility];  Service: Ophthalmology;;  . MEMBRANE PEEL Right 02/24/2019   Procedure: Antoine Primas;  Surgeon: Bernarda Caffey, MD;  Location: [facility];  Service: Ophthalmology;  Laterality: Right;  . PARS PLANA VITRECTOMY Left 03/31/2019   Procedure: PARS PLANA VITRECTOMY WITH 25 GAUGE WITH MEMBRANE PEEL;  Surgeon: Bernarda Caffey, MD;  Location: [facility];  Service: Ophthalmology;  Laterality: Left;  . PHOTOCOAGULATION WITH LASER Right 02/24/2019   Procedure: Photocoagulation With Laser;  Surgeon: Bernarda Caffey, MD;  Location: [facility];  Service: Ophthalmology;  Laterality: Right;  . PHOTOCOAGULATION WITH LASER Left 03/31/2019   Procedure: Photocoagulation With Laser;  Surgeon: Bernarda Caffey, MD;  Location: [facility];  Service: Ophthalmology;  Laterality: Left;  . RETINAL DETACHMENT SURGERY Left 03/31/2019   TRD Repair - Dr. Bernarda Caffey  . SILICON OIL REMOVAL Right 2/97/9892   Procedure: Silicon Oil Removal;  Surgeon: Bernarda Caffey, MD;  Location: [facility];  Service: Ophthalmology;  Laterality: Right;  . STERNAL WOUND DEBRIDEMENT Left 08/27/2018   Procedure: Incision and DEBRIDEMENT Left Chest, Neck and Mediastinum;  Surgeon: Gaye Pollack, MD;  Location[facility];  Service: Thoracic;  Laterality: Left;  . STERNAL WOUND DEBRIDEMENT Left 08/30/2018   Procedure: WOUND VAC CHANGE, LEFT CHEST AND NECK, POSSIBLE DEBRIDEMENT;  Surgeon: Gaye Pollack, MD;  [facility];  Service: Thoracic;  Laterality: Left;    FAMILY HISTORY Family History  Problem Relation Age of Onset  . Hypertension Mother   . Colon cancer Neg Hx     SOCIAL HISTORY Social History   Tobacco Use  . Smoking status: Never Smoker   . Smokeless tobacco: Never Used  Substance Use Topics  . Alcohol use: No  . Drug use: No         OPHTHALMIC EXAM:  Base Eye Exam    Visual Acuity (Snellen - Linear)      Right Left   Dist [place] 20/800 20/150 -2   Dist ph [place] 20/400 -2 20/100 -2       Tonometry (Tonopen, 8:58 AM)      Right Left   Pressure 15 13       Pupils      Dark Light Shape React APD   Right 5 4.5 Round Minimal None   Left 4 3.5 Irregular Minimal None       Visual Fields (Counting fingers)      Left Right    Full    Restrictions  Partial outer inferior nasal deficiency       Extraocular Movement      Right Left    Full Full  RET       Neuro/Psych    Oriented x3: Yes   Mood/Affect: Normal       Dilation    Both eyes: 1.0% Mydriacyl, 2.5% Phenylephrine @ 8:58 AM        Slit Lamp and Fundus Exam    Slit Lamp Exam      Right Left   Lids/Lashes mild Meibomian gland dysfunction mild Meibomian gland dysfunction   Conjunctiva/Sclera subconj silicon oil, sutures intact, Subconjunctival hemorrhage, 1+ Injection, Chemosis White and quiet   Cornea 2-3+ inferior PEE, Well healed cataract wounds Clear, Well healed cataract wounds, 1+ Punctate epithelial erosions   Anterior Chamber Deep, 1+pigment Deep, 2-3+pigment   Iris slightly irregular, dilated, posterior synchiae from 3:00-10:30 Irregular, Posterior synechiae from 1000-1100   Lens PC IOL in good position, pigment deposition, 1+ PCO PC IOL in good position, mild pigment deposition   Vitreous Post vit, silicon oil bubble ~11-94%. post vitrectomy, good silicone oil bubble fill       Fundus Exam      Right Left   Disc 2-3+pallor, sharp rim 0.5+pallor, sharp rim, improved fibrosis   C/D Ratio 0.4 0.4   Macula Flat under oil, Blunted foveal reflex, scattered Microaneurysms, central CME Flat under oil, blunted foveal reflex, focal pre-retinal fibrosis along ST arcades, reattachment of focal RD with good laser around hole -- +ERM superior macula  with striae  Vessels attenuated; tortuous; improved fibrosis -  mild fibrotic remnants attenuated; fibrosis along IT arcades improved   Periphery Attached, good 360 PRP laser, mild residual shallow SRF inferiorly -- improving, fibrosis along ST arcades improved under oil Retina re-Attached, focal detachment resolved under oil -- good laser surrounding, good 360 PRP laser changes, improved fibrosis          IMAGING AND PROCEDURES  Imaging and Procedures for '@TODAY'$ @  OCT, Retina - OU - Both Eyes       Right Eye Quality was good. Central Foveal Thickness: 696. Progression has been stable. Findings include preretinal fibrosis, abnormal foveal contour, subretinal fluid, outer retinal atrophy, epiretinal membrane, intraretinal fluid (Macula reattached under oil, Central CME, trace residual pockets of SRF inferiorly).   Left Eye Quality was good. Central Foveal Thickness: 598. Progression has worsened. Findings include vitreous traction, preretinal fibrosis, intraretinal fluid, abnormal foveal contour, intraretinal hyper-reflective material, epiretinal membrane, subretinal fluid (Interval increase in IRF/CME).   Notes *Images captured and stored on drive  Diagnosis / Impression:  OD: Macula reattached under oil, central CME, trace residual pockets of SRF inferiorly OS: interval increase in IRF/CME; +ERM--interval thickening from prior   Clinical management:  See below  Abbreviations: NFP - Normal foveal profile. CME - cystoid macular edema. PED - pigment epithelial detachment. IRF - intraretinal fluid. SRF - subretinal fluid. EZ - ellipsoid zone. ERM - epiretinal membrane. ORA - outer retinal atrophy. ORT - outer retinal tubulation. SRHM - subretinal hyper-reflective material        Intravitreal Injection, Pharmacologic Agent - OD - Right Eye       Time Out 05/20/2019. 9:59 AM. Confirmed correct patient, procedure, site, and patient consented.   Anesthesia Topical anesthesia was  used. Anesthetic medications included Lidocaine 2%, Proparacaine 0.5%.   Procedure Preparation included eyelid speculum, 5% betadine to ocular surface. A supplied needle was used.   Injection:  1.25 mg Bevacizumab (AVASTIN) SOLN   NDC: 02637-858-85, Lot: [id number]'$ , Expiration date: 06/29/2019   Route: Intravitreal, Site: Right Eye, Waste: 0 mg  Post-op Post injection exam found visual acuity of at least counting fingers. The patient tolerated the procedure well. There were no complications. The patient received written and verbal post procedure care education.        Injection into Tenon's Capsule - OS - Left Eye       Time Out 05/20/2019. 10:01 AM. Confirmed correct patient, procedure, site, and patient consented.   Anesthesia Topical anesthesia was used. Anesthetic medications included Lidocaine 2%, Proparacaine 0.5%.   Procedure Preparation included 5% betadine to ocular surface. A 27 gauge needle was used.   Injection:  4 mg KENALOG-'40mg'$ /ml injection '4mg'$    NDC: 23/04/2019, [id number], Expiration date: 10/10/2019   Route: Other, Site: Left Eye  Post-op Post injection exam found visual acuity of at least counting fingers. The patient tolerated the procedure well. There were no complications. The patient received written and verbal post procedure care education.   Notes 0.7 cc of Kenalog-40 (28 mg) injected into subtenon's capsule in the superotemporal quadrant. Betadine was applied to Injection area pre and post-injection then rinsed with sterile BSS. 1 drop of gatifloxacin was instilled into the eye. There were no complications. Pt tolerated procedure well.                 ASSESSMENT/PLAN:    ICD-10-CM   1. Proliferative diabetic retinopathy of both eyes with macular edema associated with type 2 diabetes mellitus (HCC)  16/08/2019 Intravitreal Injection, Pharmacologic  Agent - OD - Right Eye    Bevacizumab (AVASTIN) SOLN 1.25 mg    Injection into Tenon's  Capsule - OS - Left Eye    triamcinolone acetonide (KENALOG-40) injection 4 mg  2. 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 Vitreous hemorrhage of both eyes ([place])  H43.13   5. Retinal edema  H35.81 OCT, Retina - OU - Both Eyes  6. Essential hypertension  I10   7. Hypertensive retinopathy of both eyes  H35.033   8. Pseudophakia of both eyes  Z96.1   9. CME (cystoid macular edema), left  H35.352 Injection into Tenon's Capsule - OS - Left Eye    triamcinolone acetonide (KENALOG-40) injection 4 mg    1-5. Proliferative diabetic retinopathy with DME, TRD, and vitreous hemorrhage OU (OD > OS)  - pt with complex medical history with hospitalization in Jan-Feb 2020 for bacteremia and abscess  - history of poor glycemic control for years  - s/p IVA OU #1 (06.02.20), #2 (07.01.20), #3 (09.11.20)  - s/p PRP OS (06.10.20)  - exam showed diffuse vitreous and preretinal hemorrhage + scattered fibrosis OU   - pre-op clearing of VH OD improved visualization of posterior pole --+macular TRD OD; and ?regressing NVD OS  - pre-op OCT shows preretinal fibrosis w/ traction OU (OD > OS)  - s/p PPV/PFC/EL/FAX/silicone oil OD, 82.42.35  - s/p subconj silicon oil removal OD 8.20.20  - now POW7 s/p PPV/EL/FAX/silicone oil OS, 36.14.43             - doing well   - BCVA OD 20/400; OS 15/400  - subconj silicon oil prolapse OD             - OD: improved fibrosis, retina attached and in good position under oil, central CME, trace residual pockets of SRF inferiorly  - OD with post op CN VI palsy / abduction deficit -- monitor -- may need further eval once stable post op  - OS: improved fibrosis, focal RD reattached under oil; interval increase in IRF and ERM  - recommend IVA OS #4 and IVTA OS #1 today (10.09.20)   - pt wishes to proceed  - RBA of procedure discussed, questions answered  - informed consent obtained and signed  - see procedure note   - IOP 15  OD, 13 OS              - cont PF to 4x/day OU                         brimonidine QD daily OU                                                 PSO ung QID OU  - cont  Prolensa QID OU             - cont face down 50% of time; avoid laying flat on back             - post op drop and positioning instructions reviewed             - tylenol/ibuprofen for pain  - f/u 2 weeks  6,7. Hypertensive retinopathy OU  - discussed importance of tight BP control  - monitor  8.  Pseudophakia OU  - s/p CE/IOL OU (Dr. Gershon Crane)  9. CME OS  - central IRF -- increased from prior  - complicated by worsening ERM and DME  - recommend IVTA OS #1 as above  - continue PF and Prolensa QID OS   Ophthalmic Meds Ordered this visit:  Meds ordered this encounter  Medications  . Bevacizumab (AVASTIN) SOLN 1.25 mg  . triamcinolone acetonide (KENALOG-40) injection 4 mg       Return in about 2 weeks (around 06/03/2019) for PDR OU, DFE, OCT.  There are no Patient Instructions on file for this visit.   Explained the diagnoses, plan, and follow up with the patient and they expressed understanding.  Patient expressed understanding of the importance of proper follow up care.   This document serves as a record of services personally performed by Gardiner Sleeper, MD, PhD. It was created on their behalf by Ernest Mallick, OA, an ophthalmic assistant. The creation of this record is the provider's dictation and/or activities during the visit.    Electronically signed by: Ernest Mallick, OA 10.07.2020 1:06 PM   Gardiner Sleeper, M.D., Ph.D. Diseases & Surgery of the Retina and Vitreous [facility]  I have reviewed the above documentation for accuracy and completeness, and I agree with the above. Gardiner Sleeper, M.D., Ph.D. 05/22/19 1:06 PM    Abbreviations: M myopia (nearsighted); A astigmatism; H hyperopia (farsighted); P presbyopia; Mrx spectacle prescription;  CTL contact lenses; OD right  eye; OS left eye; OU both eyes  XT exotropia; ET esotropia; PEK punctate epithelial keratitis; PEE punctate epithelial erosions; DES dry eye syndrome; MGD meibomian gland dysfunction; ATs artificial tears; PFAT's preservative free artificial tears; [place] nuclear sclerotic cataract; PSC posterior subcapsular cataract; ERM epi-retinal membrane; PVD posterior vitreous detachment; RD retinal detachment; DM diabetes mellitus; DR diabetic retinopathy; NPDR non-proliferative diabetic retinopathy; PDR proliferative diabetic retinopathy; CSME clinically significant macular edema; DME diabetic macular edema; dbh dot blot hemorrhages; CWS cotton wool spot; POAG primary open angle glaucoma; C/D cup-to-disc ratio; HVF humphrey visual field; GVF goldmann visual field; OCT optical coherence tomography; IOP intraocular pressure; BRVO Branch retinal vein occlusion; CRVO central retinal vein occlusion; CRAO central retinal artery occlusion; BRAO branch retinal artery occlusion; RT retinal tear; SB scleral buckle; PPV pars plana vitrectomy; VH Vitreous hemorrhage; PRP panretinal laser photocoagulation; IVK intravitreal kenalog; VMT vitreomacular traction; MH Macular hole;  NVD neovascularization of the disc; NVE neovascularization elsewhere; AREDS age related eye disease study; ARMD age related macular degeneration; POAG primary open angle glaucoma; EBMD epithelial/anterior basement membrane dystrophy; ACIOL anterior chamber intraocular lens; IOL intraocular lens; PCIOL posterior chamber intraocular lens; Phaco/IOL phacoemulsification with intraocular lens placement; [place] photorefractive keratectomy; LASIK laser assisted in situ keratomileusis; HTN hypertension; DM diabetes mellitus; COPD chronic obstructive pulmonary disease

## 2019-05-19 ENCOUNTER — Ambulatory Visit: Payer: Managed Care, Other (non HMO) | Admitting: Dietician

## 2019-05-19 ENCOUNTER — Telehealth: Payer: Self-pay | Admitting: Dietician

## 2019-05-19 NOTE — Telephone Encounter (Signed)
Returned patient call. I was unable to leave a message as his mailbox was full. He called 10/7 and stated that he had to cancel his appointment due to a procedure [facility] the [facility].  He reported that he is having problems getting the [place] and has other questions.    Will attempt to reach him next week.  Antonieta Iba, RD, LDN, CDE

## 2019-05-20 ENCOUNTER — Encounter (INDEPENDENT_AMBULATORY_CARE_PROVIDER_SITE_OTHER): Payer: Self-pay | Admitting: Ophthalmology

## 2019-05-20 ENCOUNTER — Ambulatory Visit (INDEPENDENT_AMBULATORY_CARE_PROVIDER_SITE_OTHER): Payer: Managed Care, Other (non HMO) | Admitting: Ophthalmology

## 2019-05-20 DIAGNOSIS — H3343 Traction detachment of retina, bilateral: Secondary | ICD-10-CM

## 2019-05-20 DIAGNOSIS — H35352 Cystoid macular degeneration, left eye: Secondary | ICD-10-CM | POA: Diagnosis not present

## 2019-05-20 DIAGNOSIS — I10 Essential (primary) hypertension: Secondary | ICD-10-CM

## 2019-05-20 DIAGNOSIS — E113513 Type 2 diabetes mellitus with proliferative diabetic retinopathy with macular edema, bilateral: Secondary | ICD-10-CM

## 2019-05-20 DIAGNOSIS — H35033 Hypertensive retinopathy, bilateral: Secondary | ICD-10-CM

## 2019-05-20 DIAGNOSIS — H4921 Sixth [abducent] nerve palsy, right eye: Secondary | ICD-10-CM

## 2019-05-20 DIAGNOSIS — H4313 Vitreous hemorrhage, bilateral: Secondary | ICD-10-CM

## 2019-05-20 DIAGNOSIS — Z961 Presence of intraocular lens: Secondary | ICD-10-CM

## 2019-05-20 DIAGNOSIS — H3581 Retinal edema: Secondary | ICD-10-CM | POA: Diagnosis not present

## 2019-05-20 MED ORDER — BEVACIZUMAB CHEMO INJECTION 1.25MG/0.05ML SYRINGE FOR KALEIDOSCOPE
1.2500 mg | INTRAVITREAL | Status: AC | PRN
  Administered 2019-05-20: 10:00:00 1.25 mg via INTRAVITREAL

## 2019-05-20 MED ORDER — TRIAMCINOLONE ACETONIDE 40 MG/ML IJ SUSP FOR KALEIDOSCOPE
4.0000 mg | INTRAMUSCULAR | Status: AC | PRN
  Administered 2019-05-20: 4 mg

## 2019-05-25 ENCOUNTER — Other Ambulatory Visit (HOSPITAL_COMMUNITY): Payer: Self-pay | Admitting: Family Medicine

## 2019-05-25 ENCOUNTER — Other Ambulatory Visit (INDEPENDENT_AMBULATORY_CARE_PROVIDER_SITE_OTHER): Payer: Self-pay | Admitting: Ophthalmology

## 2019-05-26 ENCOUNTER — Encounter (HOSPITAL_COMMUNITY): Payer: Self-pay

## 2019-05-26 ENCOUNTER — Ambulatory Visit (HOSPITAL_COMMUNITY): Admission: RE | Admit: 2019-05-26 | Payer: Managed Care, Other (non HMO) | Source: Ambulatory Visit

## 2019-05-26 NOTE — Telephone Encounter (Signed)
May have this +2 refills needs a virtual visit by the end of the year

## 2019-06-02 ENCOUNTER — Other Ambulatory Visit: Payer: Self-pay | Admitting: Radiology

## 2019-06-02 ENCOUNTER — Other Ambulatory Visit: Payer: Self-pay | Admitting: Student

## 2019-06-02 NOTE — Progress Notes (Signed)
[facility] Note  06/03/2019     CHIEF COMPLAINT Patient presents for Retina Follow Up   HISTORY OF PRESENT ILLNESS: Randy Oneal is a 53 y.o. male who presents to the clinic today for:   HPI    Retina Follow Up    Patient presents with  Diabetic Retinopathy.  In both eyes.  Severity is severe.  Duration of 2 weeks.  Since onset it is gradually improving.  I, the attending physician,  performed the HPI with the patient and updated documentation appropriately.          Comments    2 week retina follow up. Patient states things are going pretty good with vision. Patient blood sugar was 145 today. Patient is using his drop and ointment.        Last edited by Bernarda Caffey, MD on 06/05/2019  1:00 AM. (History)    Pt states he is doing well   Referring physician: [name], MD [address] [place],  [place] [address]  HISTORICAL INFORMATION:   Selected notes from the MEDICAL RECORD NUMBER Referred by Dr.Mark Gershon Crane for concern of decreased vision post cataract sx LEE:  Ocular Hx- PMH-   CURRENT MEDICATIONS: Current Outpatient Medications (Ophthalmic Drugs)  Medication Sig  . bacitracin-polymyxin b (POLYSPORIN) ophthalmic ointment Place 1 application into both eyes at bedtime. (Patient taking differently: Place 1 application into both eyes 4 (four) times daily. )  . brimonidine (ALPHAGAN) 0.2 % ophthalmic solution Place 1 drop into both eyes daily.  . Bromfenac Sodium (PROLENSA) 0.07 % SOLN Place 1 drop into both eyes 4 (four) times daily.  . prednisoLONE acetate (PRED FORTE) 1 % ophthalmic suspension Place 1 drop into both eyes 4 (four) times daily.  Marland Kitchen atropine 1 % ophthalmic solution Place 1 drop into the left eye 2 (two) times daily.  . dorzolamide-timolol (COSOPT) 22.3-6.8 MG/ML ophthalmic solution Place 1 drop into the right eye daily.   Marland Kitchen gatifloxacin (ZYMAXID) 0.5 % SOLN Place 1 drop into both eyes 4 (four) times  daily.   No current facility-administered medications for this visit.  (Ophthalmic Drugs)   Current Outpatient Medications (Other)  Medication Sig  . acetaminophen (TYLENOL) 500 MG tablet Take 1,000 mg by mouth every 6 (six) hours as needed for moderate pain or headache.  Marland Kitchen amLODipine (NORVASC) 10 MG tablet Take one tablet by mouth each day (Patient taking differently: Take 10 mg by mouth daily. )  . atorvastatin (LIPITOR) 10 MG tablet TAKE ONE TABLET BY MOUTH ONCE DAILY.  Marland Kitchen Continuous Blood Gluc Sensor (FREESTYLE LIBRE SENSOR SYSTEM) MISC Use one sensor every 10 days.  . famotidine (PEPCID) 40 MG tablet Take one tablet by mouth daily (Patient taking differently: Take 40 mg by mouth daily as needed for heartburn. )  . glucose blood (BAYER CONTOUR NEXT TEST) test strip USE TO TEST FOUR TIMES DAILY.  . hydrALAZINE (APRESOLINE) 25 MG tablet Take 25 mg by mouth. 1/2 tablet 3 times daily  . insulin lispro (HUMALOG KWIKPEN) 100 UNIT/ML KwikPen Inject 0.06 mLs (6 Units total) into the skin daily with breakfast AND 0.07 mLs (7 Units total) 2 (two) times daily before lunch and supper. (Patient taking differently: inject 5-9 unit with each meal)  . Insulin Pen Needle (BD PEN NEEDLE NANO 2ND GEN) 32G X 4 MM MISC Four times daily  . metoprolol tartrate (LOPRESSOR) 50 MG tablet Take one half tablet by mouth twice daily (Patient taking differently:  Take 25 mg by mouth 2 (two) times daily. )  . MICROLET LANCETS MISC USE FOUR TIMES A DAY AS DIRECTED.  Marland Kitchen nystatin (MYCOSTATIN/NYSTOP) powder Apply topically 2 (two) times daily. To groin and scrotum (Patient taking differently: Apply 1 g topically 2 (two) times daily as needed (rash). To groin and scrotum)  . pantoprazole (PROTONIX) 40 MG tablet Take 1 tablet (40 mg total) by mouth daily.  . sildenafil (REVATIO) 20 MG tablet May take up to 5 before relations as directed  . Simethicone (GAS-X PO) Take 1 tablet by mouth daily as needed (gas).  . TRESIBA FLEXTOUCH 100  UNIT/ML SOPN FlexTouch Pen Inject 0.19 mLs (19 Units total) into the skin at bedtime. (Patient taking differently: Inject 22 Units into the skin at bedtime. )  . Vitamin D, Ergocalciferol, (DRISDOL) 1.25 MG (50000 UT) CAPS capsule Take 1.25 Units by mouth once a week.   No current facility-administered medications for this visit.  (Other)      REVIEW OF SYSTEMS: ROS    Positive for: Endocrine, Eyes   Negative for: Constitutional, Gastrointestinal, Neurological, Skin, Genitourinary, Musculoskeletal, HENT, Cardiovascular, Respiratory, Psychiatric, Allergic/Imm, Heme/Lymph   Last edited by Elmore Guise, COT on 06/03/2019 10:36 AM. (History)       ALLERGIES Allergies  Allergen Reactions  . Tramadol Nausea And Vomiting    PAST MEDICAL HISTORY Past Medical History:  Diagnosis Date  . Diabetes mellitus    Type 2   . Diabetic retinopathy of both eyes ([place]) 01/31/2019   Dr Coralyn Pear 01/2019  . Hypertension   . Renal insufficiency    ckd stage 4  . Retinal detachment    OS  . Sepsis due to Streptococcus, group B ([place]) 10/05/2018   Past Surgical History:  Procedure Laterality Date  . [place] VITRECTOMY WITH 20 GAUGE MVR PORT FOR MACULAR HOLE Right 02/24/2019   Procedure: 25 GAUGE PARS PLANA VITRECTOMY WITH 20 GAUGE MVR PORT FOR MACULAR HOLE;  Surgeon: Bernarda Caffey, MD;  Location: [facility];  Service: Ophthalmology;  Laterality: Right;  . APPLICATION OF WOUND VAC Left 08/27/2018   Procedure: APPLICATION OF WOUND VAC, left neck;  Surgeon: Gaye Pollack, MD;  Location: [facility];  Service: Thoracic;  Laterality: Left;  . APPLICATION OF WOUND VAC Left 08/30/2018   Procedure: APPLICATION OF WOUND VAC;  Surgeon: Gaye Pollack, MD;  Location: [facility];  Service: Thoracic;  Laterality: Left;  . CATARACT EXTRACTION Bilateral   . CATARACT EXTRACTION W/ INTRAOCULAR LENS  IMPLANT, BILATERAL    . COLONOSCOPY  06/24/2011   Procedure: COLONOSCOPY;  Surgeon: [name], MD;  [facility];  Service: Endoscopy;  Laterality: N/A;  8:30 AM  . EYE SURGERY    . GAS/FLUID EXCHANGE Left 03/31/2019   Procedure: Gas/Fluid Exchange;  Surgeon: Bernarda Caffey, MD;  Location: [facility];  Service: Ophthalmology;  Laterality: Left;  . INJECTION OF SILICONE OIL  5/32/9924   Procedure: Injection Of Silicone Oil;  Surgeon: Bernarda Caffey, MD;  Location: [facility] [place];  Service: Ophthalmology;;  . MEMBRANE PEEL Right 02/24/2019   Procedure: Antoine Primas;  Surgeon: Bernarda Caffey, MD;  Location: [facility];  Service: Ophthalmology;  Laterality: Right;  . PARS PLANA VITRECTOMY Left 03/31/2019   Procedure: PARS PLANA VITRECTOMY WITH 25 GAUGE WITH MEMBRANE PEEL;  Surgeon: Bernarda Caffey, MD;  Location: [facility];  Service: Ophthalmology;  Laterality: Left;  . PHOTOCOAGULATION WITH LASER Right 02/24/2019   Procedure: Photocoagulation With Laser;  Surgeon: Coralyn Pear,  Aaron Edelman, MD;  Location: [facility];  Service: Ophthalmology;  Laterality: Right;  . PHOTOCOAGULATION WITH LASER Left 03/31/2019   Procedure: Photocoagulation With Laser;  Surgeon: Bernarda Caffey, MD;  Location: [facility];  Service: Ophthalmology;  Laterality: Left;  . RETINAL DETACHMENT SURGERY Left 03/31/2019   TRD Repair - Dr. Bernarda Caffey  . SILICON OIL REMOVAL Right 4/31/5400   Procedure: Silicon Oil Removal;  Surgeon: Bernarda Caffey, MD;  Location: [facility];  Service: Ophthalmology;  Laterality: Right;  . STERNAL WOUND DEBRIDEMENT Left 08/27/2018   Procedure: Incision and DEBRIDEMENT Left Chest, Neck and Mediastinum;  Surgeon: Gaye Pollack, MD;  Location: [facility];  Service: Thoracic;  Laterality: Left;  . STERNAL WOUND DEBRIDEMENT Left 08/30/2018   Procedure: WOUND VAC CHANGE, LEFT CHEST AND NECK, POSSIBLE DEBRIDEMENT;  Surgeon: Gaye Pollack, MD;  Location[facility];  Service: Thoracic;  Laterality: Left;    FAMILY HISTORY Family History  Problem Relation Age of Onset  . Hypertension Mother   . Colon cancer Neg Hx     SOCIAL HISTORY Social History   Tobacco Use  .  Smoking status: Never Smoker  . Smokeless tobacco: Never Used  Substance Use Topics  . Alcohol use: No  . Drug use: No         OPHTHALMIC EXAM:  Base Eye Exam    Visual Acuity (Snellen - Linear)      Right Left   Dist [place] 20/400 20/200   Dist ph  20/NI 20/100-1       Tonometry (Tonopen, 10:38 AM)      Right Left   Pressure 15 17       Pupils      Dark Light Shape React APD   Right 4 3 Irregular Minimal None   Left 4 3 Irregular Minimal None       Visual Fields      Left Right    Full Full       Extraocular Movement      Right Left    Abnormal, Ortho Full, Ortho    -2 0 0  -2  0  -2 0 0   -- -- --  --  --  -- -- --         Neuro/Psych    Oriented x3: Yes   Mood/Affect: Normal       Dilation    Both eyes: 1.0% Mydriacyl, 2.5% Phenylephrine @ 10:38 AM        Slit Lamp and Fundus Exam    Slit Lamp Exam      Right Left   Lids/Lashes mild Meibomian gland dysfunction mild Meibomian gland dysfunction   Conjunctiva/Sclera 867 subconj silicon oil, sutures intact, Chemosis Melanosis, STK ST quad   Cornea 3+ inferior PEE, Well healed cataract wounds Clear, Well healed cataract wounds, 2+ Punctate epithelial erosions   Anterior Chamber Deep, 1+pigment Deep, 2-3+pigment   Iris slightly irregular, dilated, posterior synchiae from 3:00-10:30 Irregular, Posterior synechiae from 1000-1100   Lens PC IOL in good position, pigment deposition, 1+ PCO PC IOL in good position, mild pigment deposition   Vitreous Post vit, silicon oil bubble ~61-95%. post vitrectomy, good silicone oil fill       Fundus Exam      Right Left   Disc 2+pallor, sharp rim 0.5+pallor, sharp rim, improved fibrosis   C/D Ratio 0.4 0.4   Macula Flat under oil, Blunted foveal reflex, scattered Microaneurysms, central CME, +edema Flat under oil, blunted foveal reflex, focal pre-retinal  fibrosis along ST arcades, reattachment of focal RD with good laser around hole -- +ERM superior macula with  striae, +edema   Vessels attenuated attenuated; fibrosis along IT arcades improved   Periphery Attached, good 360 PRP laser, mild residual shallow SRF inferiorly -- improving, fibrosis along ST arcades improved under oil Retina re-Attached, focal detachment resolved under oil -- good laser surrounding, good 360 PRP laser changes, improved fibrosis        Refraction    Manifest Refraction (Auto)      Sphere Cylinder Axis Dist VA   Right +4.25 +0.75 157 20/NI   Left +2.25 +2.50 091 20/80-2          IMAGING AND PROCEDURES  Imaging and Procedures for @TODAY @  OCT, Retina - OU - Both Eyes       Right Eye Quality was good. Central Foveal Thickness: 663. Progression has improved. Findings include preretinal fibrosis, abnormal foveal contour, subretinal fluid, outer retinal atrophy, epiretinal membrane, intraretinal fluid (Mild interval improvement in IRF; persistent shallow SRF inferiorly).   Left Eye Quality was good. Central Foveal Thickness: 509. Progression has improved. Findings include vitreous traction, preretinal fibrosis, intraretinal fluid, abnormal foveal contour, intraretinal hyper-reflective material, epiretinal membrane, subretinal fluid (Mild Interval improvement in IRF).   Notes *Images captured and stored on drive  Diagnosis / Impression:  OD: Macula reattached under oil, central CME improved, trace residual pockets of SRF inferiorly OS: mild interval improvement in IRF/CME; +ERM--interval thickening from prior   Clinical management:  See below  Abbreviations: NFP - Normal foveal profile. CME - cystoid macular edema. PED - pigment epithelial detachment. IRF - intraretinal fluid. SRF - subretinal fluid. EZ - ellipsoid zone. ERM - epiretinal membrane. ORA - outer retinal atrophy. ORT - outer retinal tubulation. SRHM - subretinal hyper-reflective material                 ASSESSMENT/PLAN:    ICD-10-CM   1. Proliferative diabetic retinopathy of both eyes  with macular edema associated with type 2 diabetes mellitus ([place])  I43.3295   2. 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 Vitreous hemorrhage of both eyes ([place])  H43.13   5. Retinal edema  H35.81 OCT, Retina - OU - Both Eyes  6. Essential hypertension  I10   7. Hypertensive retinopathy of both eyes  H35.033   8. Pseudophakia of both eyes  Z96.1   9. CME (cystoid macular edema), left  H35.352     1-5. Proliferative diabetic retinopathy with DME, TRD, and vitreous hemorrhage OU (OD > OS)  - pt with complex medical history with hospitalization in Jan-Feb 2020 for bacteremia and abscess  - history of poor glycemic control for years  - s/p IVA OU #1 (06.02.20), #2 (07.01.20), #3 (09.11.20)  - s/p IVA OD #4 (10.09.20)  - s/p IVTA OS #1 (10.09.20)  - s/p PRP OS (06.10.20)  - exam showed diffuse vitreous and preretinal hemorrhage + scattered fibrosis OU   - pre-op clearing of VH OD improved visualization of posterior pole --+macular TRD OD; and ?regressing NVD OS  - pre-op OCT shows preretinal fibrosis w/ traction OU (OD > OS)  - s/p PPV/PFC/EL/FAX/silicone oil OD, 18.84.16  - s/p subconj silicon oil removal OD 8.20.20  - now POM2 s/p PPV/EL/FAX/silicone oil OS, 60.63.01  - BCVA OD 20/400; OS 20/100 - stable  - subconj silicon oil prolapse OD -- persistent             -  OD: improved fibrosis, retina attached and in good position under oil, central CME slightly improved, trace residual pockets of SRF inferiorly improving  - OD with post op CN VI palsy / abduction deficit -- slightly improved today -- continue monitoring -- may need further eval once stable post op  - OS: improved fibrosis, focal RD reattached under oil; interval improvement in IRF and ERM   - IOP 15 OD, 17 OS              - cont PF to 4x/day OU                         brimonidine QD daily OU                                                 PSO ung QID OU  - cont   Prolensa QID OU             - cont face down 50% of time; avoid laying flat on back             - post op drop and positioning instructions reviewed             - tylenol/ibuprofen for pain  - f/u 2 weeks  6,7. Hypertensive retinopathy OU  - discussed importance of tight BP control  - monitor  8. Pseudophakia OU  - s/p CE/IOL OU (Dr. Gershon Crane)  9. CME OS  - s/p IVTA OS #1 as above  - central IRF -- slightly improved from prior  - complicated by worsening ERM and DME  - continue PF and Prolensa QID OS   Ophthalmic Meds Ordered this visit:  No orders of the defined types were placed in this encounter.      Return in about 2 weeks (around 06/17/2019).  There are no Patient Instructions on file for this visit.   Explained the diagnoses, plan, and follow up with the patient and they expressed understanding.  Patient expressed understanding of the importance of proper follow up care.   This document serves as a record of services personally performed by Gardiner Sleeper, MD, PhD. It was created on their behalf by Ernest Mallick, OA, an ophthalmic assistant. The creation of this record is the provider's dictation and/or activities during the visit.    Electronically signed by: Ernest Mallick, OA 10.22.2020 1:11 AM   Gardiner Sleeper, M.D., Ph.D. Diseases & Surgery of the Retina and Vitreous [facility]  I have reviewed the above documentation for accuracy and completeness, and I agree with the above. Gardiner Sleeper, M.D., Ph.D. 06/05/19 1:13 AM   Abbreviations: M myopia (nearsighted); A astigmatism; H hyperopia (farsighted); P presbyopia; Mrx spectacle prescription;  CTL contact lenses; OD right eye; OS left eye; OU both eyes  XT exotropia; ET esotropia; PEK punctate epithelial keratitis; PEE punctate epithelial erosions; DES dry eye syndrome; MGD meibomian gland dysfunction; ATs artificial tears; PFAT's preservative free artificial tears; [place] nuclear  sclerotic cataract; PSC posterior subcapsular cataract; ERM epi-retinal membrane; PVD posterior vitreous detachment; RD retinal detachment; DM diabetes mellitus; DR diabetic retinopathy; NPDR non-proliferative diabetic retinopathy; PDR proliferative diabetic retinopathy; CSME clinically significant macular edema; DME diabetic macular edema; dbh dot blot hemorrhages; CWS cotton wool spot; POAG primary open angle glaucoma; C/D cup-to-disc ratio; HVF  humphrey visual field; GVF goldmann visual field; OCT optical coherence tomography; IOP intraocular pressure; BRVO Branch retinal vein occlusion; CRVO central retinal vein occlusion; CRAO central retinal artery occlusion; BRAO branch retinal artery occlusion; RT retinal tear; SB scleral buckle; PPV pars plana vitrectomy; VH Vitreous hemorrhage; PRP panretinal laser photocoagulation; IVK intravitreal kenalog; VMT vitreomacular traction; MH Macular hole;  NVD neovascularization of the disc; NVE neovascularization elsewhere; AREDS age related eye disease study; ARMD age related macular degeneration; POAG primary open angle glaucoma; EBMD epithelial/anterior basement membrane dystrophy; ACIOL anterior chamber intraocular lens; IOL intraocular lens; PCIOL posterior chamber intraocular lens; Phaco/IOL phacoemulsification with intraocular lens placement; [place] photorefractive keratectomy; LASIK laser assisted in situ keratomileusis; HTN hypertension; DM diabetes mellitus; COPD chronic obstructive pulmonary disease

## 2019-06-03 ENCOUNTER — Encounter (INDEPENDENT_AMBULATORY_CARE_PROVIDER_SITE_OTHER): Payer: Self-pay | Admitting: Ophthalmology

## 2019-06-03 ENCOUNTER — Ambulatory Visit (INDEPENDENT_AMBULATORY_CARE_PROVIDER_SITE_OTHER): Payer: Managed Care, Other (non HMO) | Admitting: Ophthalmology

## 2019-06-03 ENCOUNTER — Other Ambulatory Visit: Payer: Self-pay

## 2019-06-03 ENCOUNTER — Ambulatory Visit (HOSPITAL_COMMUNITY): Admission: RE | Admit: 2019-06-03 | Payer: Managed Care, Other (non HMO) | Source: Ambulatory Visit

## 2019-06-03 ENCOUNTER — Encounter (HOSPITAL_COMMUNITY): Payer: Self-pay

## 2019-06-03 DIAGNOSIS — H35033 Hypertensive retinopathy, bilateral: Secondary | ICD-10-CM

## 2019-06-03 DIAGNOSIS — H3581 Retinal edema: Secondary | ICD-10-CM | POA: Diagnosis not present

## 2019-06-03 DIAGNOSIS — H4921 Sixth [abducent] nerve palsy, right eye: Secondary | ICD-10-CM

## 2019-06-03 DIAGNOSIS — Z961 Presence of intraocular lens: Secondary | ICD-10-CM

## 2019-06-03 DIAGNOSIS — H35352 Cystoid macular degeneration, left eye: Secondary | ICD-10-CM

## 2019-06-03 DIAGNOSIS — E113513 Type 2 diabetes mellitus with proliferative diabetic retinopathy with macular edema, bilateral: Secondary | ICD-10-CM

## 2019-06-03 DIAGNOSIS — H3343 Traction detachment of retina, bilateral: Secondary | ICD-10-CM

## 2019-06-03 DIAGNOSIS — H4313 Vitreous hemorrhage, bilateral: Secondary | ICD-10-CM

## 2019-06-03 DIAGNOSIS — I10 Essential (primary) hypertension: Secondary | ICD-10-CM

## 2019-06-05 ENCOUNTER — Encounter (INDEPENDENT_AMBULATORY_CARE_PROVIDER_SITE_OTHER): Payer: Self-pay | Admitting: Ophthalmology

## 2019-06-10 ENCOUNTER — Other Ambulatory Visit: Payer: Self-pay

## 2019-06-10 ENCOUNTER — Ambulatory Visit (HOSPITAL_COMMUNITY)
Admission: RE | Admit: 2019-06-10 | Discharge: 2019-06-10 | Disposition: A | Discharge status: Home / Self Care | Payer: Managed Care, Other (non HMO) | Source: Ambulatory Visit | Attending: Nephrology | Admitting: Nephrology

## 2019-06-10 DIAGNOSIS — N184 Chronic kidney disease, stage 4 (severe): Secondary | ICD-10-CM | POA: Insufficient documentation

## 2019-06-10 NOTE — Progress Notes (Signed)
[facility] Note  06/17/2019     CHIEF COMPLAINT Patient presents for Retina Follow Up   HISTORY OF PRESENT ILLNESS: Randy Oneal is a 53 y.o. male who presents to the clinic today for:   HPI    Retina Follow Up    Patient presents with  Diabetic Retinopathy.  In both eyes.  This started weeks ago.  Severity is moderate.  Duration of weeks.  Since onset it is gradually improving.  I, the attending physician,  performed the HPI with the patient and updated documentation appropriately.          Comments    Patient states his vision is improving OU.  Patient denies eye pain or discomfort and denies any new or worsening floaters or fol OU.       Last edited by Randy Caffey, MD on 06/17/2019  4:22 PM. (History)    Pt states he is doing well, pt is using PF / prolensa qid OU, brim qd OU and PSO ung qid OU, pt had a minor procedure Weds on his kidneys, he states there was no blockage   Referring physician: Rutherford Guys, MD [place],  [place] [address]  HISTORICAL INFORMATION:   Selected notes from the MEDICAL RECORD NUMBER Referred by Randy.Mark Gershon Oneal for concern of decreased vision post cataract sx LEE:  Ocular Hx- PMH-   CURRENT MEDICATIONS: Current Outpatient Medications (Ophthalmic Drugs)  Medication Sig  . bacitracin-polymyxin b (POLYSPORIN) ophthalmic ointment Place 1 application into both eyes at bedtime. (Patient taking differently: Place 1 application into both eyes 4 (four) times daily. )  . brimonidine (ALPHAGAN) 0.2 % ophthalmic solution Place 1 drop into both eyes daily.  . Bromfenac Sodium (PROLENSA) 0.07 % SOLN Place 1 drop into both eyes 4 (four) times daily.  Marland Kitchen gatifloxacin (ZYMAXID) 0.5 % SOLN Place 1 drop into both eyes 4 (four) times daily.  . prednisoLONE acetate (PRED FORTE) 1 % ophthalmic suspension Place 1 drop into both eyes 4 (four) times daily.   No current facility-administered medications for this  visit.  (Ophthalmic Drugs)   Current Outpatient Medications (Other)  Medication Sig  . acetaminophen (TYLENOL) 500 MG tablet Take 1,000 mg by mouth every 6 (six) hours as needed for moderate pain or headache.  Marland Kitchen amLODipine (NORVASC) 10 MG tablet Take one tablet by mouth each day (Patient taking differently: Take 10 mg by mouth daily. )  . atorvastatin (LIPITOR) 10 MG tablet TAKE ONE TABLET BY MOUTH ONCE DAILY. (Patient taking differently: Take 10 mg by mouth daily. )  . Continuous Blood Gluc Sensor (FREESTYLE LIBRE SENSOR SYSTEM) MISC Use one sensor every 10 days.  . famotidine (PEPCID) 40 MG tablet Take one tablet by mouth daily (Patient taking differently: Take 40 mg by mouth daily as needed for heartburn. )  . furosemide (LASIX) 40 MG tablet Take 40 mg by mouth daily.  Marland Kitchen glucose blood (BAYER CONTOUR NEXT TEST) test strip USE TO TEST FOUR TIMES DAILY.  . hydrALAZINE (APRESOLINE) 25 MG tablet Take 25 mg by mouth. 1/2 tablet 3 times daily  . insulin lispro (HUMALOG KWIKPEN) 100 UNIT/ML KwikPen Inject 0.06 mLs (6 Units total) into the skin daily with breakfast AND 0.07 mLs (7 Units total) 2 (two) times daily before lunch and supper. (Patient taking differently: inject 5-9 unit with each meal)  . Insulin Pen Needle (BD PEN NEEDLE NANO 2ND GEN) 32G X 4 MM MISC Four times daily  .  metoprolol tartrate (LOPRESSOR) 50 MG tablet Take one half tablet by mouth twice daily (Patient taking differently: Take 25 mg by mouth 2 (two) times daily. )  . MICROLET LANCETS MISC USE FOUR TIMES A DAY AS DIRECTED.  Marland Kitchen nystatin (MYCOSTATIN/NYSTOP) powder Apply topically 2 (two) times daily. To groin and scrotum (Patient taking differently: Apply 1 g topically 2 (two) times daily as needed (rash). To groin and scrotum)  . pantoprazole (PROTONIX) 40 MG tablet Take 1 tablet (40 mg total) by mouth daily.  . sildenafil (REVATIO) 20 MG tablet May take up to 5 before relations as directed  . Simethicone (GAS-X PO) Take 1 tablet  by mouth daily as needed (gas).  . TRESIBA FLEXTOUCH 100 UNIT/ML SOPN FlexTouch Pen Inject 0.19 mLs (19 Units total) into the skin at bedtime. (Patient taking differently: Inject 22 Units into the skin at bedtime. )  . Vitamin D, Ergocalciferol, (DRISDOL) 1.25 MG (50000 UT) CAPS capsule Take 1.25 Units by mouth once a week.   No current facility-administered medications for this visit.  (Other)      REVIEW OF SYSTEMS: ROS    Positive for: Endocrine, Eyes   Negative for: Constitutional, Gastrointestinal, Neurological, Skin, Genitourinary, Musculoskeletal, HENT, Cardiovascular, Respiratory, Psychiatric, Allergic/Imm, Heme/Lymph   Last edited by Randy Oneal on 06/17/2019  9:57 AM. (History)       ALLERGIES Allergies  Allergen Reactions  . Tramadol Nausea And Vomiting    PAST MEDICAL HISTORY Past Medical History:  Diagnosis Date  . Diabetes mellitus    Type 2   . Diabetic retinopathy of both eyes (Randy Oneal) 01/31/2019   Randy Oneal 01/2019  . Hypertension   . Renal insufficiency    ckd stage 4  . Retinal detachment    OS  . Sepsis due to Streptococcus, group B ([place]) 10/05/2018   Past Surgical History:  Procedure Laterality Date  . [place] VITRECTOMY WITH 20 GAUGE MVR PORT FOR MACULAR HOLE Right 02/24/2019   Procedure: 25 GAUGE PARS PLANA VITRECTOMY WITH 20 GAUGE MVR PORT FOR MACULAR HOLE;  Surgeon: Randy Caffey, MD;  Location: [facility];  Service: Ophthalmology;  Laterality: Right;  . APPLICATION OF WOUND VAC Left 08/27/2018   Procedure: APPLICATION OF WOUND VAC, left neck;  Surgeon: Randy Pollack, MD;  Location: [facility];  Service: Thoracic;  Laterality: Left;  . APPLICATION OF WOUND VAC Left 08/30/2018   Procedure: APPLICATION OF WOUND VAC;  Surgeon: Randy Pollack, MD;  Location: [facility];  Service: Thoracic;  Laterality: Left;  . CATARACT EXTRACTION Bilateral   . CATARACT EXTRACTION W/ INTRAOCULAR LENS  IMPLANT, BILATERAL    . COLONOSCOPY  06/24/2011   Procedure:  COLONOSCOPY;  Surgeon: Randy Peng, MD;  [facility];  Service: Endoscopy;  Laterality: N/A;  8:30 AM  . EYE SURGERY    . GAS/FLUID EXCHANGE Left 03/31/2019   Procedure: Gas/Fluid Exchange;  Surgeon: Randy Caffey, MD;  Location: [facility];  Service: Ophthalmology;  Laterality: Left;  . INJECTION OF SILICONE OIL  3/76/2831   Procedure: Injection Of Silicone Oil;  Surgeon: Randy Caffey, MD;  Location: [facility];  Service: Ophthalmology;;  . MEMBRANE PEEL Right 02/24/2019   Procedure: Antoine Primas;  Surgeon: Randy Caffey, MD;  Location: [facility];  Service: Ophthalmology;  Laterality: Right;  . PARS PLANA VITRECTOMY Left 03/31/2019   Procedure: PARS PLANA VITRECTOMY WITH 25 GAUGE WITH MEMBRANE PEEL;  Surgeon: Randy Caffey, MD;  Location: [facility];  Service: Ophthalmology;  Laterality:  Left;  . PHOTOCOAGULATION WITH LASER Right 02/24/2019   Procedure: Photocoagulation With Laser;  Surgeon: Randy Caffey, MD;  Location: [facility];  Service: Ophthalmology;  Laterality: Right;  . PHOTOCOAGULATION WITH LASER Left 03/31/2019   Procedure: Photocoagulation With Laser;  Surgeon: Randy Caffey, MD;  Location: [facility];  Service: Ophthalmology;  Laterality: Left;  . RETINAL DETACHMENT SURGERY Left 03/31/2019   TRD Repair - Randy. Bernarda Oneal  . SILICON OIL REMOVAL Right 9/98/3382   Procedure: Silicon Oil Removal;  Surgeon: Randy Caffey, MD;  Location: [facility];  Service: Ophthalmology;  Laterality: Right;  . STERNAL WOUND DEBRIDEMENT Left 08/27/2018   Procedure: Incision and DEBRIDEMENT Left Chest, Neck and Mediastinum;  Surgeon: Randy Pollack, MD;  Location: [facility];  Service: Thoracic;  Laterality: Left;  . STERNAL WOUND DEBRIDEMENT Left 08/30/2018   Procedure: WOUND VAC CHANGE, LEFT CHEST AND NECK, POSSIBLE DEBRIDEMENT;  Surgeon: Randy Pollack, MD;  Location: [facility];  Service: Thoracic;  Laterality: Left;    FAMILY HISTORY Family History  Problem Relation Age of Onset  . Hypertension Mother   . Colon cancer Neg  Hx     SOCIAL HISTORY Social History   Tobacco Use  . Smoking status: Never Smoker  . Smokeless tobacco: Never Used  Substance Use Topics  . Alcohol use: No  . Drug use: No         OPHTHALMIC EXAM:  Base Eye Exam    Visual Acuity (Snellen - Linear)      Right Left   Dist [place] CF @ face 20/200 +1   Dist ph [place] 20/250 20/150 +1       Tonometry (Tonopen, 10:02 AM)      Right Left   Pressure 19 19       Pupils      Dark Light Shape React APD   Right 3 2 Irregular Brisk 0   Left 3 2 Irregular Brisk 0       Visual Fields      Left Right    Full Full       Extraocular Movement      Right Left    Abnormal Full    -2 0 0  -2  0  -2 0 0   0 0 0  0  0  0 0 0         Neuro/Psych    Oriented x3: Yes   Mood/Affect: Normal       Dilation    Both eyes: 1.0% Mydriacyl, 2.5% Phenylephrine @ 10:02 AM        Slit Lamp and Fundus Exam    Slit Lamp Exam      Right Left   Lids/Lashes mild Meibomian gland dysfunction mild Meibomian gland dysfunction   Conjunctiva/Sclera 505 subconj silicon oil, sutures intact, Chemosis Melanosis, residual STK ST quad   Cornea 3+ inferior PEE, Well healed cataract wound Clear, Well healed cataract wound, 3+ Punctate epithelial erosions   Anterior Chamber Deep, 1-2+pigment Deep, 2+pigment   Iris slightly irregular, dilated, posterior synchiae from 3:00-10:30 Irregular, Posterior synechiae from 1000-1100   Lens PC IOL in good position, pigment deposition, 1+ PCO PC IOL in good position, mild pigment deposition   Vitreous Post vit, silicon oil bubble ~39-76%. post vitrectomy, good silicone oil fill       Fundus Exam      Right Left   Disc 2+pallor, sharp rim +pallor, sharp rim, improved fibrosis   C/D Ratio 0.2 0.4  Macula Flat under oil, Blunted foveal reflex, scattered Microaneurysms, central CME, +edema Flat under oil, blunted foveal reflex, focal pre-retinal fibrosis along ST arcades, reattachment of focal RD with good laser  around hole -- +ERM superior macula with striae, +edema, cluster of temporal IRH   Vessels attenuated attenuated; fibrosis along IT arcades improved   Periphery Attached, good 360 PRP laser, mild residual shallow SRF inferiorly -- improving, fibrosis along ST arcades improved under oil Retina re-Attached, focal detachment resolved under oil -- good laser surrounding, good 360 PRP laser changes, scattered IRH, improved fibrosis          IMAGING AND PROCEDURES  Imaging and Procedures for @TODAY @  OCT, Retina - OU - Both Eyes       Right Eye Quality was good. Central Foveal Thickness: 641. Progression has improved. Findings include preretinal fibrosis, abnormal foveal contour, subretinal fluid, outer retinal atrophy, epiretinal membrane, intraretinal fluid (Mild interval improvement in IRF; persistent shallow SRF inferiorly - improved).   Left Eye Quality was good. Central Foveal Thickness: 494. Progression has improved. Findings include vitreous traction, preretinal fibrosis, intraretinal fluid, abnormal foveal contour, intraretinal hyper-reflective material, epiretinal membrane, subretinal fluid (Mild Interval improvement in IRF).   Notes *Images captured and stored on drive  Diagnosis / Impression:  OD: Macula reattached under oil, central CME improved, trace residual pockets of SRF inferiorly - improved OS: mild interval improvement in IRF/CME; +ERM--interval improvement in thickening   Clinical management:  See below  Abbreviations: NFP - Normal foveal profile. CME - cystoid macular edema. PED - pigment epithelial detachment. IRF - intraretinal fluid. SRF - subretinal fluid. EZ - ellipsoid zone. ERM - epiretinal membrane. ORA - outer retinal atrophy. ORT - outer retinal tubulation. SRHM - subretinal hyper-reflective material        Intravitreal Injection, Pharmacologic Agent - OD - Right Eye       Time Out 06/17/2019. 11:49 AM. Confirmed correct patient, procedure, site,  and patient consented.   Anesthesia Topical anesthesia was used. Anesthetic medications included Lidocaine 2%, Proparacaine 0.5%.   Procedure Preparation included 5% betadine to ocular surface, eyelid speculum. A 30 gauge needle was used.   Injection:  1.25 mg Bevacizumab (AVASTIN) SOLN   NDC: 67341-937-90, Lot: [id number] , Expiration date: 08/24/2019   Route: Intravitreal, Site: Right Eye, Waste: 0 mL  Post-op Post injection exam found visual acuity of at least counting fingers. The patient tolerated the procedure well. There were no complications. The patient received written and verbal post procedure care education.        Intravitreal Injection, Pharmacologic Agent - OS - Left Eye       Time Out 06/17/2019. 11:50 AM. Confirmed correct patient, procedure, site, and patient consented.   Anesthesia Topical anesthesia was used. Anesthetic medications included Proparacaine 0.5%, Lidocaine 2%.   Procedure Preparation included eyelid speculum, 5% betadine to ocular surface. A 30 gauge needle was used.   Injection:  1.25 mg Bevacizumab (AVASTIN) SOLN   NDC: 24/01/2019, Lot: [id number] , Expiration date: 08/30/2019   Route: Intravitreal, Site: Left Eye, Waste: 0 mL  Post-op Post injection exam found visual acuity of at least counting fingers. The patient tolerated the procedure well. There were no complications. The patient received written and verbal post procedure care education.                 ASSESSMENT/PLAN:    ICD-10-CM   1. Proliferative diabetic retinopathy of both eyes with macular edema associated with type 2 diabetes mellitus ([place])  09/12/2019 Intravitreal  Injection, Pharmacologic Agent - OD - Right Eye    Intravitreal Injection, Pharmacologic Agent - OS - Left Eye  2. 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 Vitreous hemorrhage of both eyes ([place])  H43.13   5. Retinal edema  H35.81 OCT, Retina -  OU - Both Eyes  6. Essential hypertension  I10   7. Hypertensive retinopathy of both eyes  H35.033   8. Pseudophakia of both eyes  Z96.1   9. CME (cystoid macular edema), left  H35.352     1-5. Proliferative diabetic retinopathy with DME, TRD, and vitreous hemorrhage OU (OD > OS)  - pt with complex medical history with hospitalization in Jan-Feb 2020 for bacteremia and abscess  - history of poor glycemic control for years  - s/p IVA OU #1 (06.02.20), #2 (07.01.20), #3 (09.11.20)  - s/p IVA OD #4 (10.09.20)  - s/p STK OS #1 (10.09.20)  - s/p PRP OS (06.10.20)  - exam showed diffuse vitreous and preretinal hemorrhage + scattered fibrosis OU   - pre-op clearing of VH OD improved visualization of posterior pole --+macular TRD OD; and ?regressing NVD OS  - pre-op OCT shows preretinal fibrosis w/ traction OU (OD > OS)  - s/p PPV/PFC/EL/FAX/silicone oil OD, 46.96.29  - s/p subconj silicon oil removal OD 8.20.20  - now POM2 s/p PPV/EL/FAX/silicone oil OS, 52.84.13  - BCVA OD improved to 20/250 from 20/400; OS decreased to 20/150+1 from 24/401   - subconj silicon oil prolapse OD -- persistent             - OD: improved fibrosis, retina attached and in good position under oil, central CME slightly improved, trace residual pockets of SRF inferiorly improving  - OD with post op CN VI palsy / abduction deficit -- slightly improved today -- continue monitoring -- may need further eval once stable post op  - OS: improved fibrosis, focal RD reattached under oil; interval improvement in IRF and ERM  - recommend IVA OU (OD #5, OS #2) today, 11.06.20  - pt wishes to proceed  - RBA of procedure discussed, questions answered  - informed consent obtained and signed  - see procedure note  - IOP 19 OU              - increase brimonidine bid daily OU                                     - cont PF to 4x/day OU  - cont Prolensa QID OU             - can dc face down positioning; avoid laying flat on back              - post op drop and positioning instructions reviewed             - tylenol/ibuprofen for pain  - f/u 4 weeks  6,7. Hypertensive retinopathy OU  - discussed importance of tight BP control  - monitor  8. Pseudophakia OU  - s/p CE/IOL OU (Randy. Gershon Oneal)  9. CME OS  - s/p STK OS #1 as above  - central IRF -- slightly improved from prior  - complicated by worsening ERM and DME  - continue PF and Prolensa QID OS   Ophthalmic Meds Ordered this visit:  No orders of the defined types  were placed in this encounter.      Return in about 4 weeks (around 07/15/2019) for f/u PDR OU, DFE, OCT.  There are no Patient Instructions on file for this visit.   Explained the diagnoses, plan, and follow up with the patient and they expressed understanding.  Patient expressed understanding of the importance of proper follow up care.   This document serves as a record of services personally performed by Gardiner Sleeper, MD, PhD. It was created on their behalf by Ernest Mallick, OA, an ophthalmic assistant. The creation of this record is the provider's dictation and/or activities during the visit.    Electronically signed by: Ernest Mallick, OA 10.30.2020 4:23 PM   Gardiner Sleeper, M.D., Ph.D. Diseases & Surgery of the Retina and Vitreous [facility]  I have reviewed the above documentation for accuracy and completeness, and I agree with the above. Gardiner Sleeper, M.D., Ph.D. 06/17/19 4:28 PM   Abbreviations: M myopia (nearsighted); A astigmatism; H hyperopia (farsighted); P presbyopia; Mrx spectacle prescription;  CTL contact lenses; OD right eye; OS left eye; OU both eyes  XT exotropia; ET esotropia; PEK punctate epithelial keratitis; PEE punctate epithelial erosions; DES dry eye syndrome; MGD meibomian gland dysfunction; ATs artificial tears; PFAT's preservative free artificial tears; [place] nuclear sclerotic cataract; PSC posterior subcapsular cataract; ERM epi-retinal  membrane; PVD posterior vitreous detachment; RD retinal detachment; DM diabetes mellitus; Randy diabetic retinopathy; NPDR non-proliferative diabetic retinopathy; PDR proliferative diabetic retinopathy; CSME clinically significant macular edema; DME diabetic macular edema; dbh dot blot hemorrhages; CWS cotton wool spot; POAG primary open angle glaucoma; C/D cup-to-disc ratio; HVF humphrey visual field; GVF goldmann visual field; OCT optical coherence tomography; IOP intraocular pressure; BRVO Branch retinal vein occlusion; CRVO central retinal vein occlusion; CRAO central retinal artery occlusion; BRAO branch retinal artery occlusion; RT retinal tear; SB scleral buckle; PPV pars plana vitrectomy; VH Vitreous hemorrhage; PRP panretinal laser photocoagulation; IVK intravitreal kenalog; VMT vitreomacular traction; MH Macular hole;  NVD neovascularization of the disc; NVE neovascularization elsewhere; AREDS age related eye disease study; ARMD age related macular degeneration; POAG primary open angle glaucoma; EBMD epithelial/anterior basement membrane dystrophy; ACIOL anterior chamber intraocular lens; IOL intraocular lens; PCIOL posterior chamber intraocular lens; Phaco/IOL phacoemulsification with intraocular lens placement; [place] photorefractive keratectomy; LASIK laser assisted in situ keratomileusis; HTN hypertension; DM diabetes mellitus; COPD chronic obstructive pulmonary disease

## 2019-06-13 ENCOUNTER — Other Ambulatory Visit (INDEPENDENT_AMBULATORY_CARE_PROVIDER_SITE_OTHER): Payer: Self-pay | Admitting: Ophthalmology

## 2019-06-13 MED ORDER — BRIMONIDINE TARTRATE 0.2 % OP SOLN: 1.0000 [drp] | Freq: Every day | OPHTHALMIC | 10 refills | Status: DC

## 2019-06-13 MED ORDER — PROLENSA 0.07 % OP SOLN: 1.0000 [drp] | Freq: Four times a day (QID) | OPHTHALMIC | 10 refills | Status: DC

## 2019-06-14 ENCOUNTER — Other Ambulatory Visit: Payer: Self-pay | Admitting: Radiology

## 2019-06-14 ENCOUNTER — Other Ambulatory Visit: Payer: Self-pay | Admitting: Student

## 2019-06-15 ENCOUNTER — Other Ambulatory Visit: Payer: Self-pay

## 2019-06-15 ENCOUNTER — Ambulatory Visit (HOSPITAL_COMMUNITY)
Admission: RE | Admit: 2019-06-15 | Discharge: 2019-06-15 | Disposition: A | Discharge status: Home / Self Care | Payer: Managed Care, Other (non HMO) | Source: Ambulatory Visit | Attending: Nephrology | Admitting: Nephrology

## 2019-06-15 ENCOUNTER — Encounter (HOSPITAL_COMMUNITY): Payer: Self-pay

## 2019-06-15 DIAGNOSIS — Z8249 Family history of ischemic heart disease and other diseases of the circulatory system: Secondary | ICD-10-CM | POA: Diagnosis not present

## 2019-06-15 DIAGNOSIS — Z794 Long term (current) use of insulin: Secondary | ICD-10-CM | POA: Insufficient documentation

## 2019-06-15 DIAGNOSIS — I129 Hypertensive chronic kidney disease with stage 1 through stage 4 chronic kidney disease, or unspecified chronic kidney disease: Secondary | ICD-10-CM | POA: Insufficient documentation

## 2019-06-15 DIAGNOSIS — N184 Chronic kidney disease, stage 4 (severe): Secondary | ICD-10-CM | POA: Insufficient documentation

## 2019-06-15 DIAGNOSIS — Z886 Allergy status to analgesic agent status: Secondary | ICD-10-CM | POA: Diagnosis not present

## 2019-06-15 DIAGNOSIS — R809 Proteinuria, unspecified: Secondary | ICD-10-CM | POA: Insufficient documentation

## 2019-06-15 DIAGNOSIS — E11319 Type 2 diabetes mellitus with unspecified diabetic retinopathy without macular edema: Secondary | ICD-10-CM | POA: Insufficient documentation

## 2019-06-15 DIAGNOSIS — Z79899 Other long term (current) drug therapy: Secondary | ICD-10-CM | POA: Diagnosis not present

## 2019-06-15 DIAGNOSIS — E1122 Type 2 diabetes mellitus with diabetic chronic kidney disease: Secondary | ICD-10-CM | POA: Insufficient documentation

## 2019-06-15 DIAGNOSIS — R319 Hematuria, unspecified: Secondary | ICD-10-CM

## 2019-06-15 LAB — CBC
HCT: 29.8 % — ABNORMAL LOW (ref 39.0–52.0)
Hemoglobin: 9.5 g/dL — ABNORMAL LOW (ref 13.0–17.0)
MCH: 27.3 pg (ref 26.0–34.0)
MCHC: 31.9 g/dL (ref 30.0–36.0)
MCV: 85.6 fL (ref 80.0–100.0)
Platelets: 158 10*3/uL (ref 150–400)
RBC: 3.48 MIL/uL — ABNORMAL LOW (ref 4.22–5.81)
RDW: 13.5 % (ref 11.5–15.5)
WBC: 5.6 10*3/uL (ref 4.0–10.5)
nRBC: 0 % (ref 0.0–0.2)

## 2019-06-15 LAB — PROTIME-INR
INR: 0.9 (ref 0.8–1.2)
Prothrombin Time: 12.4 seconds (ref 11.4–15.2)

## 2019-06-15 LAB — GLUCOSE, CAPILLARY: Glucose-Capillary: 113 mg/dL — ABNORMAL HIGH (ref 70–99)

## 2019-06-15 MED ORDER — MIDAZOLAM HCL 2 MG/2ML IJ SOLN
INTRAMUSCULAR | Status: AC
  Filled 2019-06-15: qty 2

## 2019-06-15 MED ORDER — LIDOCAINE-EPINEPHRINE 1 %-1:100000 IJ SOLN
INTRAMUSCULAR | Status: AC
  Filled 2019-06-15: qty 1

## 2019-06-15 MED ORDER — FENTANYL CITRATE (PF) 100 MCG/2ML IJ SOLN
INTRAMUSCULAR | Status: AC | PRN
  Administered 2019-06-15: 25 ug via INTRAVENOUS
  Administered 2019-06-15: 50 ug via INTRAVENOUS

## 2019-06-15 MED ORDER — MIDAZOLAM HCL 2 MG/2ML IJ SOLN
INTRAMUSCULAR | Status: AC | PRN
  Administered 2019-06-15: 1 mg via INTRAVENOUS
  Administered 2019-06-15: 0.5 mg via INTRAVENOUS

## 2019-06-15 MED ORDER — FENTANYL CITRATE (PF) 100 MCG/2ML IJ SOLN
INTRAMUSCULAR | Status: AC
  Filled 2019-06-15: qty 2

## 2019-06-15 MED ORDER — LIDOCAINE HCL (PF) 1 % IJ SOLN
INTRAMUSCULAR | Status: AC
  Filled 2019-06-15: qty 30

## 2019-06-15 MED ORDER — SODIUM CHLORIDE 0.9 % IV SOLN
INTRAVENOUS | Status: AC | PRN
  Administered 2019-06-15: 10 mL/h via INTRAVENOUS

## 2019-06-15 MED ORDER — HYDRALAZINE HCL 20 MG/ML IJ SOLN
INTRAMUSCULAR | Status: AC
  Filled 2019-06-15: qty 1

## 2019-06-15 MED ORDER — SODIUM CHLORIDE 0.9 % IV SOLN: INTRAVENOUS | Status: DC

## 2019-06-15 MED ORDER — HYDRALAZINE HCL 20 MG/ML IJ SOLN
INTRAMUSCULAR | Status: AC | PRN
  Administered 2019-06-15: 10 mg via INTRAVENOUS

## 2019-06-15 NOTE — Discharge Instructions (Addendum)

## 2019-06-15 NOTE — H&P (Signed)
Chief Complaint: Patient was seen in consultation today for proteinuria  Referring Physician(s): Claudia Desanctis  Supervising Physician: Sandi Mariscal  Patient Status: Randy Oneal - Out-pt  History of Present Illness: Randy Oneal is a 53 y.o. male with past medical history of DM, HTN, renal insufficiency presents with CKD Stage IV marked by proteinuria and microscopic hematuria.  Request has been made for percutaneous random renal biopsy by Dr. [name].   Patient presents today in his usual state of health. Denies concerns or complaints today. He has been NPO.  He does not take blood thinners.   Past Medical History:  Diagnosis Date  . Diabetes mellitus    Type 2   . Diabetic retinopathy of both eyes ([place]) 01/31/2019   Dr Coralyn Pear 01/2019  . Hypertension   . Renal insufficiency    ckd stage 4  . Retinal detachment    OS  . Sepsis due to Streptococcus, group B ([place]) 10/05/2018    Past Surgical History:  Procedure Laterality Date  . [place] VITRECTOMY WITH 20 GAUGE MVR PORT FOR MACULAR HOLE Right 02/24/2019   Procedure: 25 GAUGE PARS PLANA VITRECTOMY WITH 20 GAUGE MVR PORT FOR MACULAR HOLE;  Surgeon: Bernarda Caffey, MD;  Location: [place];  Service: Ophthalmology;  Laterality: Right;  . APPLICATION OF WOUND VAC Left 08/27/2018   Procedure: APPLICATION OF WOUND VAC, left neck;  Surgeon: Gaye Pollack, MD;  Location: [place];  Service: Thoracic;  Laterality: Left;  . APPLICATION OF WOUND VAC Left 08/30/2018   Procedure: APPLICATION OF WOUND VAC;  Surgeon: Gaye Pollack, MD;  Location: [place];  Service: Thoracic;  Laterality: Left;  . CATARACT EXTRACTION Bilateral   . CATARACT EXTRACTION W/ INTRAOCULAR LENS  IMPLANT, BILATERAL    . COLONOSCOPY  06/24/2011   Procedure: COLONOSCOPY;  Surgeon: [name], MD;  [facility];  Service: Endoscopy;  Laterality: N/A;  8:30 AM  . EYE SURGERY    . GAS/FLUID EXCHANGE Left 03/31/2019   Procedure: Gas/Fluid Exchange;   Surgeon: Bernarda Caffey, MD;  Location: [place];  Service: Ophthalmology;  Laterality: Left;  . INJECTION OF SILICONE OIL  3/71/0626   Procedure: Injection Of Silicone Oil;  Surgeon: Bernarda Caffey, MD;  Location: [place];  Service: Ophthalmology;;  . MEMBRANE PEEL Right 02/24/2019   Procedure: Antoine Primas;  Surgeon: Bernarda Caffey, MD;  Location: [place];  Service: Ophthalmology;  Laterality: Right;  . PARS PLANA VITRECTOMY Left 03/31/2019   Procedure: PARS PLANA VITRECTOMY WITH 25 GAUGE WITH MEMBRANE PEEL;  Surgeon: Bernarda Caffey, MD;  Location: [place];  Service: Ophthalmology;  Laterality: Left;  . PHOTOCOAGULATION WITH LASER Right 02/24/2019   Procedure: Photocoagulation With Laser;  Surgeon: Bernarda Caffey, MD;  Location: [place];  Service: Ophthalmology;  Laterality: Right;  . PHOTOCOAGULATION WITH LASER Left 03/31/2019   Procedure: Photocoagulation With Laser;  Surgeon: Bernarda Caffey, MD;  Location: [place];  Service: Ophthalmology;  Laterality: Left;  . RETINAL DETACHMENT SURGERY Left 03/31/2019   TRD Repair - Dr. Bernarda Caffey  . SILICON OIL REMOVAL Right 9/48/5462   Procedure: Silicon Oil Removal;  Surgeon: Bernarda Caffey, MD;  Location: [place];  Service: Ophthalmology;  Laterality: Right;  . STERNAL WOUND DEBRIDEMENT Left 08/27/2018   Procedure: Incision and DEBRIDEMENT Left Chest, Neck and Mediastinum;  Surgeon: Gaye Pollack, MD;  Location: [facility];  Service: Thoracic;  Laterality: Left;  . STERNAL WOUND DEBRIDEMENT Left 08/30/2018   Procedure: WOUND VAC CHANGE, LEFT  CHEST AND NECK, POSSIBLE DEBRIDEMENT;  Surgeon: Gaye Pollack, MD;  Location: [place];  Service: Thoracic;  Laterality: Left;    Allergies: Tramadol  Medications: Prior to Admission medications   Medication Sig Start Date End Date Taking? Authorizing Provider  amLODipine (NORVASC) 10 MG tablet Take one tablet by mouth each day Patient taking differently: Take 10 mg by mouth daily.  02/16/19  Yes [name], MD  atorvastatin  (LIPITOR) 10 MG tablet TAKE ONE TABLET BY MOUTH ONCE DAILY. 05/26/19  Yes [name], MD  bacitracin-polymyxin b (POLYSPORIN) ophthalmic ointment Place 1 application into both eyes at bedtime. Patient taking differently: Place 1 application into both eyes 4 (four) times daily.  04/22/19  Yes Bernarda Caffey, MD  brimonidine (ALPHAGAN) 0.2 % ophthalmic solution Place 1 drop into both eyes daily. 06/13/19  Yes Bernarda Caffey, MD  Bromfenac Sodium (PROLENSA) 0.07 % SOLN Place 1 drop into both eyes 4 (four) times daily. 06/13/19  Yes Bernarda Caffey, MD  famotidine (PEPCID) 40 MG tablet Take one tablet by mouth daily Patient taking differently: Take 40 mg by mouth daily as needed for heartburn.  02/16/19  Yes Luking, [name], MD  furosemide (LASIX) 40 MG tablet Take 40 mg by mouth daily.   Yes [provider]  gatifloxacin (ZYMAXID) 0.5 % SOLN Place 1 drop into both eyes 4 (four) times daily.   Yes [provider]  hydrALAZINE (APRESOLINE) 25 MG tablet Take 25 mg by mouth. 1/2 tablet 3 times daily 05/16/19  Yes [provider]  insulin lispro (HUMALOG KWIKPEN) 100 UNIT/ML KwikPen Inject 0.06 mLs (6 Units total) into the skin daily with breakfast AND 0.07 mLs (7 Units total) 2 (two) times daily before lunch and supper. Patient taking differently: inject 5-9 unit with each meal 11/17/18  Yes Shamleffer, Melanie Crazier, MD  metoprolol tartrate (LOPRESSOR) 50 MG tablet Take one half tablet by mouth twice daily Patient taking differently: Take 25 mg by mouth 2 (two) times daily.  09/28/18  Yes [name], MD  nystatin (MYCOSTATIN/NYSTOP) powder Apply topically 2 (two) times daily. To groin and scrotum Patient taking differently: Apply 1 g topically 2 (two) times daily as needed (rash). To groin and scrotum 09/04/18  Yes Barrett, Erin R, PA-C  prednisoLONE acetate (PRED FORTE) 1 % ophthalmic suspension Place 1 drop into both eyes 4 (four) times daily. 04/08/19  Yes Bernarda Caffey, MD   TRESIBA FLEXTOUCH 100 UNIT/ML SOPN FlexTouch Pen Inject 0.19 mLs (19 Units total) into the skin at bedtime. Patient taking differently: Inject 22 Units into the skin at bedtime.  11/17/18  Yes Shamleffer, Melanie Crazier, MD  Vitamin D, Ergocalciferol, (DRISDOL) 1.25 MG (50000 UT) CAPS capsule Take 1.25 Units by mouth once a week. 05/16/19  Yes [provider]  acetaminophen (TYLENOL) 500 MG tablet Take 1,000 mg by mouth every 6 (six) hours as needed for moderate pain or headache.    [provider]  atropine 1 % ophthalmic solution Place 1 drop into the left eye 2 (two) times daily.    [provider]  Continuous Blood Gluc Sensor (FREESTYLE LIBRE SENSOR SYSTEM) MISC Use one sensor every 10 days. 08/10/17   [name], MD  dorzolamide-timolol (COSOPT) 22.3-6.8 MG/ML ophthalmic solution Place 1 drop into the right eye daily.     [provider]  glucose blood (BAYER CONTOUR NEXT TEST) test strip USE TO TEST FOUR TIMES DAILY. 07/28/17   [name], MD  Insulin Pen  Needle (BD PEN NEEDLE NANO 2ND GEN) 32G X 4 MM MISC Four times daily 10/20/18   Shamleffer, Melanie Crazier, MD  MICROLET LANCETS MISC USE FOUR TIMES A DAY AS DIRECTED. 11/19/16   [name], MD  pantoprazole (PROTONIX) 40 MG tablet Take 1 tablet (40 mg total) by mouth daily. 10/14/18 10/14/19  [name], MD  sildenafil (REVATIO) 20 MG tablet May take up to 5 before relations as directed 03/04/17   [name], MD  Simethicone (GAS-X PO) Take 1 tablet by mouth daily as needed (gas).    [provider]     Family History  Problem Relation Age of Onset  . Hypertension Mother   . Colon cancer Neg Hx     Social History   Socioeconomic History  . Marital status: Married    Spouse name: Not on file  . Number of children: Not on file  . Years of education: Not on file  . Highest education level: Not on file  Occupational History  . Not on file  Social  Needs  . Financial resource strain: Not on file  . Food insecurity    Worry: Not on file    Inability: Not on file  . Transportation needs    Medical: Not on file    Non-medical: Not on file  Tobacco Use  . Smoking status: Never Smoker  . Smokeless tobacco: Never Used  Substance and Sexual Activity  . Alcohol use: No  . Drug use: No  . Sexual activity: Not on file  Lifestyle  . Physical activity    Days per week: Not on file    Minutes per session: Not on file  . Stress: Not on file  Relationships  . Social Herbalist on phone: Not on file    Gets together: Not on file    Attends religious service: Not on file    Active member of club or organization: Not on file    Attends meetings of clubs or organizations: Not on file    Relationship status: Not on file  Other Topics Concern  . Not on file  Social History Narrative  . Not on file     Review of Systems: A 12 point ROS discussed and pertinent positives are indicated in the HPI above.  All other systems are negative.  Review of Systems  Constitutional: Negative for fatigue and fever.  Respiratory: Negative for cough and shortness of breath.   Cardiovascular: Negative for chest pain.  Gastrointestinal: Negative for abdominal pain, diarrhea, nausea and vomiting.  Genitourinary: Negative for dysuria and hematuria.  Musculoskeletal: Negative for back pain.  Psychiatric/Behavioral: Negative for behavioral problems and confusion.    Vital Signs: BP (!) 171/97   Pulse 72   Temp 98 F (36.7 C) (Oral)   Resp 18   Ht 5\' 10"  (1.778 m)   Wt 195 lb (88.5 kg)   SpO2 100%   BMI 27.98 kg/m   Physical Exam Vitals signs and nursing note reviewed.  Constitutional:      General: He is not in acute distress.    Appearance: Normal appearance.  HENT:     Mouth/Throat:     Mouth: Mucous membranes are moist.     Pharynx: Oropharynx is clear.  Cardiovascular:     Rate and Rhythm: Normal rate and regular rhythm.      Pulses: Normal pulses.     Heart sounds: No murmur.  Pulmonary:  [place]: Pulmonary effort is normal. No respiratory distress.     Breath sounds: Normal breath sounds.  Abdominal:     General: Abdomen is flat.     Palpations: Abdomen is soft.  Skin:    General: Skin is warm and dry.  Neurological:     General: No focal deficit present.     Mental Status: He is alert and oriented to person, place, and time. Mental status is at baseline.  Psychiatric:        Mood and Affect: Mood normal.        Behavior: Behavior normal.        Thought Content: Thought content normal.        Judgment: Judgment normal.      MD Evaluation Airway: WNL Heart: WNL Abdomen: WNL Chest/ Lungs: WNL ASA  Classification: 3 Mallampati/Airway Score: Two   Imaging: US Renal  Result Date: 06/10/2019 CLINICAL DATA:  Chronic kidney disease stage 4 EXAM: RENAL / URINARY TRACT ULTRASOUND COMPLETE COMPARISON:  CT 05/22/2011 FINDINGS: Right Kidney: Renal measurements: 11.6 x 5.6 x 5.5 cm = volume: 206 mL. Diffusely increased echotexture. No mass or hydronephrosis. Left Kidney: Renal measurements: 12.6 x 5.3 x 5.6 cm = volume: 164 mL. Diffusely increased echotexture. No mass or hydronephrosis. Bladder: Appears normal for degree of bladder distention. Other: None IMPRESSION: Increased echotexture in the kidneys compatible with chronic medical renal disease. No hydronephrosis. Electronically Signed   By: Rolm Baptise M.D.   On: 06/10/2019 12:32    Labs:  CBC: Recent Labs    09/02/18 0307 02/15/19 1120 03/31/19 0850 06/15/19 0620  WBC 8.1 4.0 4.8 5.6  HGB 7.0* 9.8* 10.7* 9.5*  HCT 21.3* 30.5* 32.5* 29.8*  PLT 364 231 202 158    COAGS: Recent Labs    08/17/18 2149 06/15/19 0620  INR 1.01 0.9  APTT 32  --     BMP: Recent Labs    02/15/19 1120 02/17/19 1116 02/24/19 1018 03/31/19 0850  NA 139 141 141 140  K 4.1 4.8 4.3 3.9  CL 106 105 111 111  CO2 25 25 23  21*  GLUCOSE 216* 186* 139*  155*  BUN 35* 31* 35* 49*  CALCIUM 8.7* 8.6* 9.0 8.8*  CREATININE 3.31* 2.78* 2.98* 3.52*  GFRNONAA 20* 25* 23* 19*  GFRAA 23* 29* 27* 22*    LIVER FUNCTION TESTS: Recent Labs    08/16/18 1135 08/17/18 0427 08/17/18 2149 08/18/18 0548  BILITOT 1.9* 1.0 1.2 1.4*  AST 44* 40 89* 71*  ALT 35 26 45* 41  ALKPHOS 160* 126 171* 178*  PROT 6.1* 4.7* 4.9* 5.4*  ALBUMIN 2.3* 1.7* 1.6* 1.8*    TUMOR MARKERS: No results for input(s): AFPTM, CEA, CA199, CHROMGRNA in the last 8760 hours.  Assessment and Plan: Patient with past medical history of HTN, DM, renal insufficiency presents with complaint of proteinuria, worsening renal function.  IR consulted for random renal biopsy at the request of Dr. [name]. Case reviewed by Dr. Pascal Lux who approves patient for procedure.  Patient presents today in their usual state of health.  He has been NPO and is not currently on blood thinners.    Risks and benefits of biopsy was discussed with the patient and/or patient's family including, but not limited to bleeding, infection, damage to adjacent structures or low yield requiring additional tests.  All of the questions were answered and there is agreement to proceed.  Consent signed and in chart.  Thank you for this interesting consult.  I greatly enjoyed meeting Fidencio Duddy Oneal and look forward to participating in their care.  A copy of this report was sent to the requesting provider on this date.  Electronically Signed: Docia Barrier, PA 06/15/2019, 7:39 AM   I spent a total of  30 Minutes   in face to face in clinical consultation, greater than 50% of which was counseling/coordinating care for proteinuria, chronic kidney disease stage 4.

## 2019-06-15 NOTE — Procedures (Signed)
Pre Procedure Dx: Proteinuria Post Procedural Dx: Same  Technically successful US guided biopsy of inferior pole of the right kidney.  EBL: Minimal  Procedure complicated by development of a small, non-enlarging perinephric hematoma.  Ronny Bacon, MD Pager #: [phone]

## 2019-06-17 ENCOUNTER — Ambulatory Visit (INDEPENDENT_AMBULATORY_CARE_PROVIDER_SITE_OTHER): Payer: Managed Care, Other (non HMO) | Admitting: Ophthalmology

## 2019-06-17 ENCOUNTER — Encounter (INDEPENDENT_AMBULATORY_CARE_PROVIDER_SITE_OTHER): Payer: Self-pay | Admitting: Ophthalmology

## 2019-06-17 DIAGNOSIS — H3581 Retinal edema: Secondary | ICD-10-CM

## 2019-06-17 DIAGNOSIS — H4313 Vitreous hemorrhage, bilateral: Secondary | ICD-10-CM | POA: Diagnosis not present

## 2019-06-17 DIAGNOSIS — H3343 Traction detachment of retina, bilateral: Secondary | ICD-10-CM | POA: Diagnosis not present

## 2019-06-17 DIAGNOSIS — H35033 Hypertensive retinopathy, bilateral: Secondary | ICD-10-CM

## 2019-06-17 DIAGNOSIS — H35352 Cystoid macular degeneration, left eye: Secondary | ICD-10-CM

## 2019-06-17 DIAGNOSIS — H4921 Sixth [abducent] nerve palsy, right eye: Secondary | ICD-10-CM | POA: Diagnosis not present

## 2019-06-17 DIAGNOSIS — E113513 Type 2 diabetes mellitus with proliferative diabetic retinopathy with macular edema, bilateral: Secondary | ICD-10-CM | POA: Diagnosis not present

## 2019-06-17 DIAGNOSIS — Z961 Presence of intraocular lens: Secondary | ICD-10-CM

## 2019-06-17 DIAGNOSIS — I10 Essential (primary) hypertension: Secondary | ICD-10-CM

## 2019-06-17 MED ORDER — BEVACIZUMAB CHEMO INJECTION 1.25MG/0.05ML SYRINGE FOR KALEIDOSCOPE
1.2500 mg | INTRAVITREAL | Status: AC | PRN
  Administered 2019-06-17: 16:00:00 1.25 mg via INTRAVITREAL

## 2019-06-17 MED ORDER — BEVACIZUMAB CHEMO INJECTION 1.25MG/0.05ML SYRINGE FOR KALEIDOSCOPE
1.2500 mg | INTRAVITREAL | Status: AC | PRN
  Administered 2019-06-17: 1.25 mg via INTRAVITREAL

## 2019-07-11 LAB — SURGICAL PATHOLOGY

## 2019-07-14 NOTE — Progress Notes (Signed)
[facility] Note  07/15/2019     CHIEF COMPLAINT Patient presents for Retina Follow Up   HISTORY OF PRESENT ILLNESS: Randy Oneal is a 53 y.o. male who presents to the clinic today for:   HPI    Retina Follow Up    Patient presents with  Diabetic Retinopathy.  In both eyes.  This started 4 weeks ago.  Severity is moderate.  I, the attending physician,  performed the HPI with the patient and updated documentation appropriately.          Comments    Patient here for 4 weeks retina follow up for PDR OU. Patient states vision coming along slowly. Getting better.be glad when gets RX for vision to get crisper. No eye pain. Using eye drops.       Last edited by Bernarda Caffey, MD on 07/16/2019 12:13 AM. (History)    Pt states he is doing okay, he had an ultrasound on his kidneys and there was no blockage, but they did a biopsy and found a lot of scarring, pt states his bp has been very high, but he started a new medication and has been monitoring it and it has been steadily coming down, he states it was over 200 in his kidney drs office, but last night it was 125/86  Referring physician: Rutherford Guys, MD [place],  [place] [id number]  HISTORICAL INFORMATION:   Selected notes from the MEDICAL RECORD NUMBER Referred by Dr.Mark Gershon Crane for concern of decreased vision post cataract sx LEE:  Ocular Hx- PMH-   CURRENT MEDICATIONS: Current Outpatient Medications (Ophthalmic Drugs)  Medication Sig  . bacitracin-polymyxin b (POLYSPORIN) ophthalmic ointment Place 1 application into both eyes at bedtime. (Patient taking differently: Place 1 application into both eyes 4 (four) times daily. )  . brimonidine (ALPHAGAN) 0.2 % ophthalmic solution Place 1 drop into both eyes daily.  . Bromfenac Sodium (PROLENSA) 0.07 % SOLN Place 1 drop into both eyes 4 (four) times daily.  Marland Kitchen gatifloxacin (ZYMAXID) 0.5 % SOLN Place 1 drop into both eyes 4 (four)  times daily.  . prednisoLONE acetate (PRED FORTE) 1 % ophthalmic suspension Place 1 drop into both eyes 4 (four) times daily.   No current facility-administered medications for this visit.  (Ophthalmic Drugs)   Current Outpatient Medications (Other)  Medication Sig  . acetaminophen (TYLENOL) 500 MG tablet Take 1,000 mg by mouth every 6 (six) hours as needed for moderate pain or headache.  Marland Kitchen amLODipine (NORVASC) 10 MG tablet Take one tablet by mouth each day (Patient taking differently: Take 10 mg by mouth daily. )  . atorvastatin (LIPITOR) 10 MG tablet TAKE ONE TABLET BY MOUTH ONCE DAILY. (Patient taking differently: Take 10 mg by mouth daily. )  . Continuous Blood Gluc Sensor (FREESTYLE LIBRE SENSOR SYSTEM) MISC Use one sensor every 10 days.  . famotidine (PEPCID) 40 MG tablet Take one tablet by mouth daily (Patient taking differently: Take 40 mg by mouth daily as needed for heartburn. )  . furosemide (LASIX) 40 MG tablet Take 40 mg by mouth daily.  Marland Kitchen glucose blood (BAYER CONTOUR NEXT TEST) test strip USE TO TEST FOUR TIMES DAILY.  . hydrALAZINE (APRESOLINE) 25 MG tablet Take 25 mg by mouth. 1/2 tablet 3 times daily  . insulin lispro (HUMALOG KWIKPEN) 100 UNIT/ML KwikPen Inject 0.06 mLs (6 Units total) into the skin daily with breakfast AND 0.07 mLs (7 Units total) 2 (two) times daily before  lunch and supper. (Patient taking differently: inject 5-9 unit with each meal)  . Insulin Pen Needle (BD PEN NEEDLE NANO 2ND GEN) 32G X 4 MM MISC Four times daily  . metoprolol tartrate (LOPRESSOR) 50 MG tablet Take one half tablet by mouth twice daily (Patient taking differently: Take 25 mg by mouth 2 (two) times daily. )  . MICROLET LANCETS MISC USE FOUR TIMES A DAY AS DIRECTED.  Marland Kitchen nystatin (MYCOSTATIN/NYSTOP) powder Apply topically 2 (two) times daily. To groin and scrotum (Patient taking differently: Apply 1 g topically 2 (two) times daily as needed (rash). To groin and scrotum)  . pantoprazole  (PROTONIX) 40 MG tablet Take 1 tablet (40 mg total) by mouth daily.  . sildenafil (REVATIO) 20 MG tablet May take up to 5 before relations as directed  . Simethicone (GAS-X PO) Take 1 tablet by mouth daily as needed (gas).  . TRESIBA FLEXTOUCH 100 UNIT/ML SOPN FlexTouch Pen Inject 0.19 mLs (19 Units total) into the skin at bedtime. (Patient taking differently: Inject 22 Units into the skin at bedtime. )  . Vitamin D, Ergocalciferol, (DRISDOL) 1.25 MG (50000 UT) CAPS capsule Take 1.25 Units by mouth once a week.   No current facility-administered medications for this visit.  (Other)      REVIEW OF SYSTEMS: ROS    Positive for: Endocrine, Eyes   Negative for: Constitutional, Gastrointestinal, Neurological, Skin, Genitourinary, Musculoskeletal, HENT, Cardiovascular, Respiratory, Psychiatric, Allergic/Imm, Heme/Lymph   Last edited by Theodore Demark, COA on 07/15/2019  9:26 AM. (History)       ALLERGIES Allergies  Allergen Reactions  . Tramadol Nausea And Vomiting    PAST MEDICAL HISTORY Past Medical History:  Diagnosis Date  . Diabetes mellitus    Type 2   . Diabetic retinopathy of both eyes ([place]) 01/31/2019   Dr Coralyn Pear 01/2019  . Hypertension   . Renal insufficiency    ckd stage 4  . Retinal detachment    OS  . Sepsis due to Streptococcus, group B ([place]) 10/05/2018   Past Surgical History:  Procedure Laterality Date  . [place] VITRECTOMY WITH 20 GAUGE MVR PORT FOR MACULAR HOLE Right 02/24/2019   Procedure: 25 GAUGE PARS PLANA VITRECTOMY WITH 20 GAUGE MVR PORT FOR MACULAR HOLE;  Surgeon: Bernarda Caffey, MD;  Location: [facility];  Service: Ophthalmology;  Laterality: Right;  . APPLICATION OF WOUND VAC Left 08/27/2018   Procedure: APPLICATION OF WOUND VAC, left neck;  Surgeon: Gaye Pollack, MD;  Location: [facility];  Service: Thoracic;  Laterality: Left;  . APPLICATION OF WOUND VAC Left 08/30/2018   Procedure: APPLICATION OF WOUND VAC;  Surgeon: Gaye Pollack, MD;   Location: [facility];  Service: Thoracic;  Laterality: Left;  . CATARACT EXTRACTION Bilateral   . CATARACT EXTRACTION W/ INTRAOCULAR LENS  IMPLANT, BILATERAL    . COLONOSCOPY  06/24/2011   Procedure: COLONOSCOPY;  Surgeon: [name], MD;  [facility];  Service: Endoscopy;  Laterality: N/A;  8:30 AM  . EYE SURGERY    . GAS/FLUID EXCHANGE Left 03/31/2019   Procedure: Gas/Fluid Exchange;  Surgeon: Bernarda Caffey, MD;  Location: [facility];  Service: Ophthalmology;  Laterality: Left;  . INJECTION OF SILICONE OIL  6/76/7209   Procedure: Injection Of Silicone Oil;  Surgeon: Bernarda Caffey, MD;  Location: [facility];  Service: Ophthalmology;;  . MEMBRANE PEEL Right 02/24/2019   Procedure: Antoine Primas;  Surgeon: Bernarda Caffey, MD;  Location: [facility];  Service: Ophthalmology;  Laterality:  Right;  Marland Kitchen PARS PLANA VITRECTOMY Left 03/31/2019   Procedure: PARS PLANA VITRECTOMY WITH 25 GAUGE WITH MEMBRANE PEEL;  Surgeon: Bernarda Caffey, MD;  Location: [facility];  Service: Ophthalmology;  Laterality: Left;  . PHOTOCOAGULATION WITH LASER Right 02/24/2019   Procedure: Photocoagulation With Laser;  Surgeon: Bernarda Caffey, MD;  Location: [facility];  Service: Ophthalmology;  Laterality: Right;  . PHOTOCOAGULATION WITH LASER Left 03/31/2019   Procedure: Photocoagulation With Laser;  Surgeon: Bernarda Caffey, MD;  Location: [facility];  Service: Ophthalmology;  Laterality: Left;  . RETINAL DETACHMENT SURGERY Left 03/31/2019   TRD Repair - Dr. Bernarda Caffey  . SILICON OIL REMOVAL Right 5/88/5027   Procedure: Silicon Oil Removal;  Surgeon: Bernarda Caffey, MD;  Location: [facility];  Service: Ophthalmology;  Laterality: Right;  . STERNAL WOUND DEBRIDEMENT Left 08/27/2018   Procedure: Incision and DEBRIDEMENT Left Chest, Neck and Mediastinum;  Surgeon: Gaye Pollack, MD;  Location[facility];  Service: Thoracic;  Laterality: Left;  . STERNAL WOUND DEBRIDEMENT Left 08/30/2018   Procedure: WOUND VAC CHANGE, LEFT CHEST AND NECK, POSSIBLE DEBRIDEMENT;   Surgeon: Gaye Pollack, MD;  Location[facility];  Service: Thoracic;  Laterality: Left;    FAMILY HISTORY Family History  Problem Relation Age of Onset  . Hypertension Mother   . Colon cancer Neg Hx     SOCIAL HISTORY Social History   Tobacco Use  . Smoking status: Never Smoker  . Smokeless tobacco: Never Used  Substance Use Topics  . Alcohol use: No  . Drug use: No         OPHTHALMIC EXAM:  Base Eye Exam    Visual Acuity (Snellen - Linear)      Right Left   Dist [place] CF at 3' 20/350   Dist ph [place] 20/250 -2 20/150 -1       Tonometry (Tonopen, 9:22 AM)      Right Left   Pressure 17 18       Pupils      Dark Light Shape React APD   Right 4 3 Irregular Brisk None   Left 3 2 Irregular Brisk None       Visual Fields (Counting fingers)      Left Right    Full Full       Extraocular Movement      Right Left    Abnormal Full    -2 -- --  -2  --  -2 -- --   0 0 0  0  0  0 0 0         Neuro/Psych    Oriented x3: Yes   Mood/Affect: Normal       Dilation    Both eyes: 1.0% Mydriacyl, 2.5% Phenylephrine @ 9:22 AM        Slit Lamp and Fundus Exam    Slit Lamp Exam      Right Left   Lids/Lashes mild Meibomian gland dysfunction mild Meibomian gland dysfunction   Conjunctiva/Sclera 741 subconj silicon oil, sutures intact, Chemosis Melanosis, residual STK ST quad   Cornea 3+ inferior PEE, Well healed cataract wound Clear, Well healed cataract wound, 3+ Punctate epithelial erosions   Anterior Chamber Deep, 1-2+pigment Deep, 2+pigment   Iris slightly irregular, dilated, posterior synchiae from 3:00-10:30 Irregular, Posterior synechiae from 1000-1100   Lens PC IOL in good position, pigment deposition, 1+ PCO PC IOL in good position, mild pigment deposition   Vitreous Post vit, silicon oil bubble ~28-78%. post vitrectomy, good silicone  oil fill       Fundus Exam      Right Left   Disc 2+pallor, sharp rim, mild fibrosis along ST rim +pallor, sharp rim,  improved fibrosis   C/D Ratio 0.2 0.4   Macula Flat under oil, Blunted foveal reflex, scattered Microaneurysms, central CME, +edema Flat under oil, blunted foveal reflex, focal pre-retinal fibrosis along ST arcades, reattachment of focal RD with good laser around hole -- +ERM superior macula with striae, interval increase in edema, cluster of temporal IRH   Vessels attenuated attenuated; fibrosis along IT arcades improved   Periphery Attached, good 360 PRP laser, mild residual shallow SRF inferiorly -- improving, fibrosis along ST arcades improved under oil Retina re-Attached, focal detachment resolved under oil -- good laser surrounding, good 360 PRP laser changes, scattered IRH, improved fibrosis          IMAGING AND PROCEDURES  Imaging and Procedures for @TODAY @  OCT, Retina - OU - Both Eyes       Right Eye Quality was good. Central Foveal Thickness: 664. Progression has worsened. Findings include preretinal fibrosis, abnormal foveal contour, subretinal fluid, outer retinal atrophy, epiretinal membrane, intraretinal fluid (Mild interval increase in IRF; persistent shallow SRF inferiorly).   Left Eye Quality was good. Central Foveal Thickness: 507. Progression has worsened. Findings include vitreous traction, preretinal fibrosis, intraretinal fluid, abnormal foveal contour, intraretinal hyper-reflective material, epiretinal membrane, subretinal fluid (Interval increase in IRF).   Notes *Images captured and stored on drive  Diagnosis / Impression:  OD: Macula reattached under oil, Mild interval increase in IRF; persistent shallow SRF inferiorly OS: mild interval increase in IRF/CME; +ERM   Clinical management:  See below  Abbreviations: NFP - Normal foveal profile. CME - cystoid macular edema. PED - pigment epithelial detachment. IRF - intraretinal fluid. SRF - subretinal fluid. EZ - ellipsoid zone. ERM - epiretinal membrane. ORA - outer retinal atrophy. ORT - outer retinal  tubulation. SRHM - subretinal hyper-reflective material        Intravitreal Injection, Pharmacologic Agent - OD - Right Eye       Time Out 07/15/2019. 10:11 AM. Confirmed correct patient, procedure, site, and patient consented.   Anesthesia Topical anesthesia was used. Anesthetic medications included Lidocaine 2%, Proparacaine 0.5%.   Procedure Preparation included 5% betadine to ocular surface, eyelid speculum. A 30 gauge needle was used.   Injection:  2 mg aflibercept Alfonse Flavors) SOLN   NDC: 40102-725-36, Lot: [id number], Expiration date: 08/07/2019   Route: Intravitreal, Site: Right Eye, Waste: 0.05 mL  Post-op Post injection exam found visual acuity of at least counting fingers. The patient tolerated the procedure well. There were no complications. The patient received written and verbal post procedure care education.   Notes **SAMPLE MEDICATION ADMINISTERED**       Intravitreal Injection, Pharmacologic Agent - OS - Left Eye       Time Out 07/15/2019. 10:13 AM. Confirmed correct patient, procedure, site, and patient consented.   Anesthesia Topical anesthesia was used. Anesthetic medications included Lidocaine 2%, Proparacaine 0.5%.   Procedure Preparation included 5% betadine to ocular surface, eyelid speculum. A 30 gauge needle was used.   Injection:  2 mg aflibercept Alfonse Flavors) SOLN   NDC: 95638-756-43, Lot: [id number], Expiration date: 09/07/2019   Route: Intravitreal, Site: Left Eye, Waste: 0.05 mL  Post-op Post injection exam found visual acuity of at least counting fingers. The patient tolerated the procedure well. There were no complications. The patient received written and verbal post procedure care education.  Notes **SAMPLE MEDICATION ADMINISTERED**                 ASSESSMENT/PLAN:    ICD-10-CM   1. Proliferative diabetic retinopathy of both eyes with macular edema associated with type 2 diabetes mellitus (HCC)  H84.6962 Intravitreal Injection,  Pharmacologic Agent - OD - Right Eye    Intravitreal Injection, Pharmacologic Agent - OS - Left Eye    aflibercept (EYLEA) SOLN 2 mg    aflibercept (EYLEA) SOLN 2 mg  2. 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 Vitreous hemorrhage of both eyes ([place])  H43.13   5. Retinal edema  H35.81 OCT, Retina - OU - Both Eyes  6. Essential hypertension  I10   7. Hypertensive retinopathy of both eyes  H35.033   8. Pseudophakia of both eyes  Z96.1   9. CME (cystoid macular edema), left  H35.352     1-5. Proliferative diabetic retinopathy with DME, TRD, and vitreous hemorrhage OU (OD > OS)  - pt with complex medical history with hospitalization in Jan-Feb 2020 for bacteremia and abscess  - history of poor glycemic control for years  - s/p IVA OD #1 (06.20.20), #2 (07.01.20), #3 (09.11.20), #4 (10.09.20), #5 (11.06.20)  - s/p IVA OS #1 (06.20.20), #2 (07.01.20), #3 (08.13.20), #4 (09.11.20)  #5 (11.06.20)  - s/p STK OS #1 (10.09.20)  - s/p PRP OS (06.10.20)  - exam showed diffuse vitreous and preretinal hemorrhage + scattered fibrosis OU   - pre-op clearing of VH OD improved visualization of posterior pole --+macular TRD OD; and ?regressing NVD OS  - pre-op OCT shows preretinal fibrosis w/ traction OU (OD > OS)  - s/p PPV/PFC/EL/FAX/silicone oil OD, 95.28.41  - s/p subconj silicon oil removal OD 8.20.20  - s/p PPV/EL/FAX/silicone oil OS, 32.44.01  - BCVA OD stable at 20/250; OS stable at 02/725  - subconj silicon oil prolapse OD -- persistent             - OD: improved fibrosis, retina attached and in good position under oil, +macular edema, slightly worse; trace residual pockets of SRF inferiorly  - OD with post op CN VI palsy / abduction deficit -- slightly improved today -- continue monitoring -- may need further eval once stable post op  - OS: improved fibrosis, focal RD reattached under oil; interval increase in IRF and ERM  - recommend IVE  OU today, 12.04.20 -- samples, and Eylea4U benefits investigation started 12.4.20  - pt wishes to proceed  - RBA of procedure discussed, questions answered  - informed consent obtained and signed  - see procedure note  - IOP 19 OU              - cont brimonidine bid daily OU                                     - cont PF 4x/day OU  - cont Prolensa QID OU             - can dc face down positioning; avoid laying flat on back             - post op drop and positioning instructions reviewed             - tylenol/ibuprofen for pain  - f/u 4-5 weeks -- DFE/OCT/possible injection  6,7. Hypertensive retinopathy  OU  - discussed importance of tight BP control  - monitor  8. Pseudophakia OU  - s/p CE/IOL OU (Dr. Gershon Crane)  9. CME OS  - s/p STK OS #1 as above -- still present in ST quad  - central IRF -- slightly worse  - complicated by worsening ERM and DME  - continue PF and Prolensa QID OS   Ophthalmic Meds Ordered this visit:  Meds ordered this encounter  Medications  . prednisoLONE acetate (PRED FORTE) 1 % ophthalmic suspension    Sig: Place 1 drop into both eyes 4 (four) times daily.    Dispense:  15 mL    Refill:  3  . aflibercept (EYLEA) SOLN 2 mg  . aflibercept (EYLEA) SOLN 2 mg       Return for 4-5 wks -- f/u PDR, Dilated Exam, OCT, Possible Injxn.  There are no Patient Instructions on file for this visit.   Explained the diagnoses, plan, and follow up with the patient and they expressed understanding.  Patient expressed understanding of the importance of proper follow up care.   This document serves as a record of services personally performed by Gardiner Sleeper, MD, PhD. It was created on their behalf by Ernest Mallick, OA, an ophthalmic assistant. The creation of this record is the provider's dictation and/or activities during the visit.    Electronically signed by: Ernest Mallick, OA 12.04.2020 12:24 AM   Gardiner Sleeper, M.D., Ph.D. Diseases & Surgery of the  Retina and Vitreous [facility]  I have reviewed the above documentation for accuracy and completeness, and I agree with the above. Gardiner Sleeper, M.D., Ph.D. 07/16/19 12:24 AM    Abbreviations: M myopia (nearsighted); A astigmatism; H hyperopia (farsighted); P presbyopia; Mrx spectacle prescription;  CTL contact lenses; OD right eye; OS left eye; OU both eyes  XT exotropia; ET esotropia; PEK punctate epithelial keratitis; PEE punctate epithelial erosions; DES dry eye syndrome; MGD meibomian gland dysfunction; ATs artificial tears; PFAT's preservative free artificial tears; [place] nuclear sclerotic cataract; PSC posterior subcapsular cataract; ERM epi-retinal membrane; PVD posterior vitreous detachment; RD retinal detachment; DM diabetes mellitus; DR diabetic retinopathy; NPDR non-proliferative diabetic retinopathy; PDR proliferative diabetic retinopathy; CSME clinically significant macular edema; DME diabetic macular edema; dbh dot blot hemorrhages; CWS cotton wool spot; POAG primary open angle glaucoma; C/D cup-to-disc ratio; HVF humphrey visual field; GVF goldmann visual field; OCT optical coherence tomography; IOP intraocular pressure; BRVO Branch retinal vein occlusion; CRVO central retinal vein occlusion; CRAO central retinal artery occlusion; BRAO branch retinal artery occlusion; RT retinal tear; SB scleral buckle; PPV pars plana vitrectomy; VH Vitreous hemorrhage; PRP panretinal laser photocoagulation; IVK intravitreal kenalog; VMT vitreomacular traction; MH Macular hole;  NVD neovascularization of the disc; NVE neovascularization elsewhere; AREDS age related eye disease study; ARMD age related macular degeneration; POAG primary open angle glaucoma; EBMD epithelial/anterior basement membrane dystrophy; ACIOL anterior chamber intraocular lens; IOL intraocular lens; PCIOL posterior chamber intraocular lens; Phaco/IOL phacoemulsification with intraocular lens placement; [place]  photorefractive keratectomy; LASIK laser assisted in situ keratomileusis; HTN hypertension; DM diabetes mellitus; COPD chronic obstructive pulmonary disease

## 2019-07-15 ENCOUNTER — Encounter (INDEPENDENT_AMBULATORY_CARE_PROVIDER_SITE_OTHER): Payer: Self-pay | Admitting: Ophthalmology

## 2019-07-15 ENCOUNTER — Ambulatory Visit (INDEPENDENT_AMBULATORY_CARE_PROVIDER_SITE_OTHER): Payer: Managed Care, Other (non HMO) | Admitting: Ophthalmology

## 2019-07-15 DIAGNOSIS — H3343 Traction detachment of retina, bilateral: Secondary | ICD-10-CM

## 2019-07-15 DIAGNOSIS — I10 Essential (primary) hypertension: Secondary | ICD-10-CM

## 2019-07-15 DIAGNOSIS — Z961 Presence of intraocular lens: Secondary | ICD-10-CM

## 2019-07-15 DIAGNOSIS — H4313 Vitreous hemorrhage, bilateral: Secondary | ICD-10-CM

## 2019-07-15 DIAGNOSIS — E113513 Type 2 diabetes mellitus with proliferative diabetic retinopathy with macular edema, bilateral: Secondary | ICD-10-CM

## 2019-07-15 DIAGNOSIS — H3581 Retinal edema: Secondary | ICD-10-CM | POA: Diagnosis not present

## 2019-07-15 DIAGNOSIS — H35033 Hypertensive retinopathy, bilateral: Secondary | ICD-10-CM

## 2019-07-15 DIAGNOSIS — H35352 Cystoid macular degeneration, left eye: Secondary | ICD-10-CM

## 2019-07-15 DIAGNOSIS — H4921 Sixth [abducent] nerve palsy, right eye: Secondary | ICD-10-CM | POA: Diagnosis not present

## 2019-07-15 MED ORDER — PREDNISOLONE ACETATE 1 % OP SUSP: 1.0000 [drp] | Freq: Four times a day (QID) | OPHTHALMIC | 3 refills | Status: DC

## 2019-07-16 ENCOUNTER — Encounter (INDEPENDENT_AMBULATORY_CARE_PROVIDER_SITE_OTHER): Payer: Self-pay | Admitting: Ophthalmology

## 2019-07-16 MED ORDER — AFLIBERCEPT 2MG/0.05ML IZ SOLN FOR KALEIDOSCOPE
2.0000 mg | INTRAVITREAL | Status: AC | PRN
  Administered 2019-07-16: 2 mg via INTRAVITREAL

## 2019-07-27 ENCOUNTER — Ambulatory Visit: Payer: Managed Care, Other (non HMO) | Admitting: Podiatry

## 2019-08-03 ENCOUNTER — Other Ambulatory Visit: Payer: Self-pay

## 2019-08-03 ENCOUNTER — Ambulatory Visit: Payer: Managed Care, Other (non HMO) | Admitting: Podiatry

## 2019-08-03 ENCOUNTER — Encounter: Payer: Self-pay | Admitting: Podiatry

## 2019-08-03 DIAGNOSIS — E1142 Type 2 diabetes mellitus with diabetic polyneuropathy: Secondary | ICD-10-CM | POA: Diagnosis not present

## 2019-08-03 DIAGNOSIS — L84 Corns and callosities: Secondary | ICD-10-CM

## 2019-08-03 DIAGNOSIS — M79675 Pain in left toe(s): Secondary | ICD-10-CM | POA: Diagnosis not present

## 2019-08-03 DIAGNOSIS — L97512 Non-pressure chronic ulcer of other part of right foot with fat layer exposed: Secondary | ICD-10-CM

## 2019-08-03 DIAGNOSIS — B351 Tinea unguium: Secondary | ICD-10-CM

## 2019-08-03 DIAGNOSIS — M79674 Pain in right toe(s): Secondary | ICD-10-CM | POA: Diagnosis not present

## 2019-08-03 NOTE — Progress Notes (Signed)
Complaint:  Visit Type: Patient returns to my office for continued preventative foot care services. Complaint: Patient states" my nails have grown long and thick and become painful to walk and wear shoes" Patient has been diagnosed with DM with no foot complications. The patient presents for preventative foot care services.  Patient has developed a callus on the bottom of his big toe right foot since his last visit.  He says he is not having any pain or discomfort so he does not know how long the skin lesion has been there.  He presents the office today for preventative foot care services.  Podiatric Exam: Vascular: dorsalis pedis and posterior tibial pulses are palpable bilateral. Capillary return is immediate. Temperature gradient is WNL. Skin turgor WNL  Sensorium: Normal Semmes Weinstein monofilament test  Left foot.  Diminished/absent LOPS right foot..  Nail Exam: Pt has thick disfigured discolored nails with subungual debris noted bilateral entire nail hallux through fifth toenails Ulcer Exam: There is no evidence of ulcer or pre-ulcerative changes or infection. Orthopedic Exam: Muscle tone and strength are WNL. No limitations in general ROM. No crepitus or effusions noted. Foot type and digits show no abnormalities. DJD 1st MPJ right foot.  PTTD  B/L. Skin: No Porokeratosis. No infection or ulcers.  Hemorrhagic callus right hallux.    Diagnosis:  Onychomycosis, , Pain in right toe, pain in left toes  Callus right hallux.  Treatment & Plan Procedures and Treatment: Consent by patient was obtained for treatment procedures.   Debridement of mycotic and hypertrophic toenails, 1 through 5 bilateral and clearing of subungual debris. No ulceration, no infection noted. Debridement of callus right hallux.  Padding dispensed.  Following treatment I reviewed the notes from Dr. Rolley Sims in early 2020.  She had diagnosed him with an ulcer with a cellulitis.  I watched him stand while he was waiting to leave  and noted that he put all of his weight along the medial aspect of the right foot.  Therefore I proceeded to ask Liliane Channel if he would be able to evaluate this patient in an effort to help to resolve his big toe right foot callus.  Liliane Channel provided him with appropriate insoles. Return Visit-Office Procedure: Patient instructed to return to the office for a follow up visit 10 weeks. for continued evaluation and treatment.    Gardiner Barefoot DPM

## 2019-08-16 NOTE — Progress Notes (Signed)
[facility] Note  08/17/2019     CHIEF COMPLAINT Patient presents for Retina Follow Up   HISTORY OF PRESENT ILLNESS: Randy Oneal is a 54 y.o. male who presents to the clinic today for:   Pt states he is doing well  Referring physician: [name], MD [address] [place],  [place] [address]  HISTORICAL INFORMATION:   Selected notes from the MEDICAL RECORD NUMBER Referred by Dr.Mark Gershon Crane for concern of decreased vision post cataract sx LEE:  Ocular Hx- PMH-   CURRENT MEDICATIONS: Current Outpatient Medications (Ophthalmic Drugs)  Medication Sig  . bacitracin-polymyxin b (POLYSPORIN) ophthalmic ointment Place 1 application into both eyes at bedtime. (Patient taking differently: Place 1 application into both eyes 4 (four) times daily. )  . brimonidine (ALPHAGAN) 0.2 % ophthalmic solution Place 1 drop into both eyes daily.  . Bromfenac Sodium (PROLENSA) 0.07 % SOLN Place 1 drop into both eyes 4 (four) times daily.  . prednisoLONE acetate (PRED FORTE) 1 % ophthalmic suspension Place 1 drop into both eyes 4 (four) times daily.  Marland Kitchen gatifloxacin (ZYMAXID) 0.5 % SOLN Place 1 drop into both eyes 4 (four) times daily.   No current facility-administered medications for this visit. (Ophthalmic Drugs)   Current Outpatient Medications (Other)  Medication Sig  . acetaminophen (TYLENOL) 500 MG tablet Take 1,000 mg by mouth every 6 (six) hours as needed for moderate pain or headache.  Marland Kitchen amLODipine (NORVASC) 10 MG tablet Take one tablet by mouth each day (Patient taking differently: Take 10 mg by mouth daily. )  . atorvastatin (LIPITOR) 10 MG tablet TAKE ONE TABLET BY MOUTH ONCE DAILY. (Patient taking differently: Take 10 mg by mouth daily. )  . calcitRIOL (ROCALTROL) 0.25 MCG capsule Take 0.25 mcg by mouth 3 (three) times a week.  . Continuous Blood Gluc Sensor (FREESTYLE LIBRE SENSOR SYSTEM) MISC Use one sensor every 10 days.  . famotidine  (PEPCID) 40 MG tablet Take one tablet by mouth daily (Patient taking differently: Take 40 mg by mouth daily as needed for heartburn. )  . furosemide (LASIX) 40 MG tablet Take 40 mg by mouth daily.  Marland Kitchen glucose blood (BAYER CONTOUR NEXT TEST) test strip USE TO TEST FOUR TIMES DAILY.  . hydrALAZINE (APRESOLINE) 50 MG tablet Take 50 mg by mouth 3 (three) times daily.  . insulin lispro (HUMALOG KWIKPEN) 100 UNIT/ML KwikPen Inject 0.06 mLs (6 Units total) into the skin daily with breakfast AND 0.07 mLs (7 Units total) 2 (two) times daily before lunch and supper. (Patient taking differently: inject 5-9 unit with each meal)  . Insulin Pen Needle (BD PEN NEEDLE NANO 2ND GEN) 32G X 4 MM MISC Four times daily  . LOKELMA 10 g PACK packet SMARTSIG:1 Packet(s) By Mouth 3 Times a Week  . metoprolol tartrate (LOPRESSOR) 50 MG tablet Take one half tablet by mouth twice daily (Patient taking differently: Take 25 mg by mouth 2 (two) times daily. )  . MICROLET LANCETS MISC USE FOUR TIMES A DAY AS DIRECTED.  Marland Kitchen nystatin (MYCOSTATIN/NYSTOP) powder Apply topically 2 (two) times daily. To groin and scrotum (Patient taking differently: Apply 1 g topically 2 (two) times daily as needed (rash). To groin and scrotum)  . pantoprazole (PROTONIX) 40 MG tablet Take 1 tablet (40 mg total) by mouth daily.  . sildenafil (REVATIO) 20 MG tablet May take up to 5 before relations as directed  . Simethicone (GAS-X PO) Take 1 tablet by mouth  daily as needed (gas).  . sodium bicarbonate 650 MG tablet Take 650 mg by mouth 2 (two) times daily.  . TRESIBA FLEXTOUCH 100 UNIT/ML SOPN FlexTouch Pen Inject 0.19 mLs (19 Units total) into the skin at bedtime. (Patient taking differently: Inject 22 Units into the skin at bedtime. )  . Vitamin D, Ergocalciferol, (DRISDOL) 1.25 MG (50000 UT) CAPS capsule Take 1.25 Units by mouth once a week.   No current facility-administered medications for this visit. (Other)      REVIEW OF SYSTEMS: ROS     Positive for: Genitourinary, Endocrine, Eyes   Negative for: Constitutional, Gastrointestinal, Neurological, Skin, Musculoskeletal, HENT, Cardiovascular, Respiratory, Psychiatric, Allergic/Imm, Heme/Lymph   Last edited by Matthew Folks, COA on 08/17/2019  9:32 AM. (History)       ALLERGIES Allergies  Allergen Reactions  . Tramadol Nausea And Vomiting    PAST MEDICAL HISTORY Past Medical History:  Diagnosis Date  . Diabetes mellitus    Type 2   . Diabetic retinopathy of both eyes ([place]) 01/31/2019   Dr Coralyn Pear 01/2019  . Herpes simplex virus (HSV) infection    OU  . Hypertension   . Renal insufficiency    ckd stage 4  . Retinal detachment    OS  . Sepsis due to Streptococcus, group B ([place]) 10/05/2018   Past Surgical History:  Procedure Laterality Date  . [place] VITRECTOMY WITH 20 GAUGE MVR PORT FOR MACULAR HOLE Right 02/24/2019   Procedure: 25 GAUGE PARS PLANA VITRECTOMY WITH 20 GAUGE MVR PORT FOR MACULAR HOLE;  Surgeon: Bernarda Caffey, MD;  Location: [facility];  Service: Ophthalmology;  Laterality: Right;  . APPLICATION OF WOUND VAC Left 08/27/2018   Procedure: APPLICATION OF WOUND VAC, left neck;  Surgeon: Gaye Pollack, MD;  Location: [facility];  Service: Thoracic;  Laterality: Left;  . APPLICATION OF WOUND VAC Left 08/30/2018   Procedure: APPLICATION OF WOUND VAC;  Surgeon: Gaye Pollack, MD;  Location: [facility];  Service: Thoracic;  Laterality: Left;  . CATARACT EXTRACTION Bilateral   . CATARACT EXTRACTION W/ INTRAOCULAR LENS  IMPLANT, BILATERAL    . COLONOSCOPY  06/24/2011   Procedure: COLONOSCOPY;  Surgeon: [name], MD;  [facility];  Service: Endoscopy;  Laterality: N/A;  8:30 AM  . EYE SURGERY    . GAS/FLUID EXCHANGE Left 03/31/2019   Procedure: Gas/Fluid Exchange;  Surgeon: Bernarda Caffey, MD;  Location: [facility];  Service: Ophthalmology;  Laterality: Left;  . INJECTION OF SILICONE OIL  2/35/5732   Procedure: Injection Of Silicone Oil;  Surgeon:  Bernarda Caffey, MD;  Location: [facility];  Service: Ophthalmology;;  . MEMBRANE PEEL Right 02/24/2019   Procedure: Antoine Primas;  Surgeon: Bernarda Caffey, MD;  Location: [facility];  Service: Ophthalmology;  Laterality: Right;  . PARS PLANA VITRECTOMY Left 03/31/2019   Procedure: PARS PLANA VITRECTOMY WITH 25 GAUGE WITH MEMBRANE PEEL;  Surgeon: Bernarda Caffey, MD;  Location: [facility];  Service: Ophthalmology;  Laterality: Left;  . PHOTOCOAGULATION WITH LASER Right 02/24/2019   Procedure: Photocoagulation With Laser;  Surgeon: Bernarda Caffey, MD;  Location: [facility];  Service: Ophthalmology;  Laterality: Right;  . PHOTOCOAGULATION WITH LASER Left 03/31/2019   Procedure: Photocoagulation With Laser;  Surgeon: Bernarda Caffey, MD;  Location: [facility];  Service: Ophthalmology;  Laterality: Left;  . RETINAL DETACHMENT SURGERY Left 03/31/2019   TRD Repair - Dr. Bernarda Caffey  . SILICON OIL REMOVAL Right 09/12/5425   Procedure: Silicon Oil Removal;  Surgeon:  Bernarda Caffey, MD;  Location: [facility];  Service: Ophthalmology;  Laterality: Right;  . STERNAL WOUND DEBRIDEMENT Left 08/27/2018   Procedure: Incision and DEBRIDEMENT Left Chest, Neck and Mediastinum;  Surgeon: Gaye Pollack, MD;  Location[facility];  Service: Thoracic;  Laterality: Left;  . STERNAL WOUND DEBRIDEMENT Left 08/30/2018   Procedure: WOUND VAC CHANGE, LEFT CHEST AND NECK, POSSIBLE DEBRIDEMENT;  Surgeon: Gaye Pollack, MD;  Location[facility];  Service: Thoracic;  Laterality: Left;    FAMILY HISTORY Family History  Problem Relation Age of Onset  . Hypertension Mother   . Colon cancer Neg Hx     SOCIAL HISTORY Social History   Tobacco Use  . Smoking status: Never Smoker  . Smokeless tobacco: Never Used  Substance Use Topics  . Alcohol use: No  . Drug use: No         OPHTHALMIC EXAM:  Base Eye Exam    Visual Acuity (Snellen - Linear)      Right Left   Dist [place] 20/350 20/250 -   Dist ph  20/250 -2 20/100 -2       Tonometry (Tonopen, 9:33 AM)       Right Left   Pressure 19 21       Pupils      Dark Light Shape React APD   Right 4 3 Irregular Brisk None   Left 3 2 Irregular Brisk None       Visual Fields (Counting fingers)      Left Right    Full Full       Extraocular Movement      Right Left    Full Full       Neuro/Psych    Oriented x3: Yes   Mood/Affect: Normal       Dilation    Both eyes: 1.0% Mydriacyl, 2.5% Phenylephrine @ 9:33 AM        Slit Lamp and Fundus Exam    Slit Lamp Exam      Right Left   Lids/Lashes mild Meibomian gland dysfunction mild Meibomian gland dysfunction   Conjunctiva/Sclera 115 subconj silicon oil, sutures intact, Chemosis Melanosis, residual STK ST quad   Cornea 3+ inferior PEE, Well healed cataract wound Clear, Well healed cataract wound, 3+ Punctate epithelial erosions   Anterior Chamber Deep, 1-2+pigment Deep, 2+pigment   Iris slightly irregular, dilated, posterior synchiae from 3:00-10:30 Irregular, Posterior synechiae from 1000-1100   Lens PC IOL in good position, pigment deposition, 1+ PCO PC IOL in good position, mild pigment deposition   Vitreous Post vit, silicon oil bubble ~72-62%. post vitrectomy, good silicone oil fill       Fundus Exam      Right Left   Disc 2+pallor, sharp rim, mild fibrosis along ST rim +pallor, sharp rim, improved fibrosis   C/D Ratio 0.2 0.4   Macula Flat under oil, Blunted foveal reflex, scattered Microaneurysms, central CME, +edema Flat under oil, blunted foveal reflex, focal pre-retinal fibrosis along ST arcades - improving, reattachment of focal RD with good laser around hole -- +ERM superior macula with striae, interval improvement in edema, cluster of temporal IRH/DBH   Vessels attenuated attenuated; fibrosis along IT arcades improved   Periphery Attached, good 360 PRP laser, mild residual shallow SRF inferiorly -- improving/almost resolved, fibrosis along ST arcades improved under oil Retina re-Attached, focal detachment resolved under oil --  good laser surrounding, good 360 PRP laser changes, scattered IRH, improved fibrosis  IMAGING AND PROCEDURES  Imaging and Procedures for @TODAY @  OCT, Retina - OU - Both Eyes       Right Eye Quality was good. Central Foveal Thickness: 618. Progression has improved. Findings include preretinal fibrosis, abnormal foveal contour, subretinal fluid, outer retinal atrophy, epiretinal membrane, intraretinal fluid (Mild interval improvement in IRF; interval improvement in  shallow SRF inferiorly).   Left Eye Quality was good. Central Foveal Thickness: 395. Progression has improved. Findings include vitreous traction, preretinal fibrosis, intraretinal fluid, abnormal foveal contour, intraretinal hyper-reflective material, epiretinal membrane, subretinal fluid (Interval improvement in IRF; small central pocket of SRF).   Notes *Images captured and stored on drive  Diagnosis / Impression:  OD: Macula reattached under oil, Mild interval improvement in IRF; interval improvement in shallow SRF inferiorly  OS: interval improvement in IRF/CME; +ERM   Clinical management:  See below  Abbreviations: NFP - Normal foveal profile. CME - cystoid macular edema. PED - pigment epithelial detachment. IRF - intraretinal fluid. SRF - subretinal fluid. EZ - ellipsoid zone. ERM - epiretinal membrane. ORA - outer retinal atrophy. ORT - outer retinal tubulation. SRHM - subretinal hyper-reflective material        Intravitreal Injection, Pharmacologic Agent - OD - Right Eye       Time Out 08/17/2019. 9:37 AM. Confirmed correct patient, procedure, site, and patient consented.   Anesthesia Topical anesthesia was used. Anesthetic medications included Lidocaine 2%, Proparacaine 0.5%.   Procedure Preparation included 5% betadine to ocular surface, eyelid speculum. A supplied (32 g) needle was used.   Injection:  2 mg aflibercept Alfonse Flavors) SOLN   NDC: A3590391, Lot: [id number], Expiration date:  02/05/2020   Route: Intravitreal, Site: Right Eye, Waste: 0.05 mL  Post-op Post injection exam found visual acuity of at least counting fingers. The patient tolerated the procedure well. There were no complications. The patient received written and verbal post procedure care education.        Intravitreal Injection, Pharmacologic Agent - OS - Left Eye       Time Out 08/17/2019. 9:38 AM. Confirmed correct patient, procedure, site, and patient consented.   Anesthesia Topical anesthesia was used. Anesthetic medications included Lidocaine 2%, Proparacaine 0.5%.   Procedure Preparation included 5% betadine to ocular surface, eyelid speculum. A supplied (32 g) needle was used.   Injection:  2 mg aflibercept Alfonse Flavors) SOLN   NDC: A3590391, Lot: [id number], Expiration date: 02/05/2020   Route: Intravitreal, Site: Left Eye, Waste: 0.05 mL  Post-op Post injection exam found visual acuity of at least counting fingers. The patient tolerated the procedure well. There were no complications. The patient received written and verbal post procedure care education.                 ASSESSMENT/PLAN:    ICD-10-CM   1. Proliferative diabetic retinopathy of both eyes with macular edema associated with type 2 diabetes mellitus (HCC)  E70.3500 Intravitreal Injection, Pharmacologic Agent - OD - Right Eye    Intravitreal Injection, Pharmacologic Agent - OS - Left Eye    aflibercept (EYLEA) SOLN 2 mg    aflibercept (EYLEA) SOLN 2 mg  2. 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 Vitreous hemorrhage of both eyes ([place])  H43.13   5. Retinal edema  H35.81 OCT, Retina - OU - Both Eyes  6. Essential hypertension  I10   7. Hypertensive retinopathy of both eyes  H35.033   8. Pseudophakia of both eyes  Z96.1   9. CME (cystoid macular edema), left  H35.352     1-5. Proliferative diabetic retinopathy with DME, TRD, and vitreous hemorrhage OU (OD >  OS)  - pt with complex medical history with hospitalization in Jan-Feb 2020 for bacteremia and abscess  - history of poor glycemic control for years  - s/p IVA OD #1 (06.20.20), #2 (07.01.20), #3 (09.11.20), #4 (10.09.20), #5 (11.06.20)  - s/p IVA OS #1 (06.20.20), #2 (07.01.20), #3 (08.13.20), #4 (09.11.20)  #5 (11.06.20)  - s/p IVE OU #1 (12.04.20) - sample  - s/p STK OS #1 (10.09.20)  - s/p PRP OS (06.10.20)  - exam showed diffuse vitreous and preretinal hemorrhage + scattered fibrosis OU   - pre-op clearing of VH OD improved visualization of posterior pole --+macular TRD OD; and ?regressing NVD OS  - pre-op OCT shows preretinal fibrosis w/ traction OU (OD > OS)  - s/p PPV/PFC/EL/FAX/silicone oil OD, 38.46.65  - s/p subconj silicon oil removal OD 8.20.20  - s/p PPV/EL/FAX/silicone oil OS, 99.35.70  - BCVA OD stable at 20/250; OS improved to 17/793  - subconj silicon oil prolapse OD -- persistent but improving             - OD: improved fibrosis, retina attached and in good position under oil, +macular edema, slightly improved; trace residual pockets of SRF inferiorly - improved  - OD with post op CN VI palsy / abduction deficit -- slightly improved today -- continue monitoring -- may need further eval once stable post op  - OS: improved fibrosis, focal RD reattached under oil; interval improvement in IRF and ERM  - **good response to IVE OU #1**  - recommend IVE OU #2 today, 01.06.21   - Eylea4U benefits investigation started 12.4.20 -- approved as of 01.06.21  - pt wishes to proceed  - RBA of procedure discussed, questions answered  - informed consent obtained and signed  - see procedure note  - IOP 19,21             - cont brimonidine bid daily OU - pt ran out, given bottle of Cosopt in office until he can get a refill                                - cont PF 4x/day OU  - cont Prolensa QID OU             - can dc face down positioning; avoid laying flat on back             -  post op drop and positioning instructions reviewed             - tylenol/ibuprofen for pain  - f/u 4-5 weeks -- DFE/OCT/possible injection  6,7. Hypertensive retinopathy OU  - discussed importance of tight BP control  - monitor  8. Pseudophakia OU  - s/p CE/IOL OU (Dr. Gershon Crane)  9. CME OS  - s/p STK OS #1 as above -- still present in ST quad  - central IRF -- slightly improved post IVE  - complicated by worsening ERM and DME  - continue PF and prolensa qid OS   Ophthalmic Meds Ordered this visit:  Meds ordered this encounter  Medications  . aflibercept (EYLEA) SOLN 2 mg  . aflibercept (EYLEA) SOLN 2 mg       Return for f/u 4-5 weeks, PDR OU, DFE, OCT.  There are no Patient  Instructions on file for this visit.   Explained the diagnoses, plan, and follow up with the patient and they expressed understanding.  Patient expressed understanding of the importance of proper follow up care.   This document serves as a record of services personally performed by Gardiner Sleeper, MD, PhD. It was created on their behalf by Roselee Nova, COMT. The creation of this record is the provider's dictation and/or activities during the visit.  Electronically signed by: Roselee Nova, COMT 08/17/19 12:07 PM   This document serves as a record of services personally performed by Gardiner Sleeper, MD, PhD. It was created on their behalf by Ernest Mallick, OA, an ophthalmic assistant. The creation of this record is the provider's dictation and/or activities during the visit.    Electronically signed by: Ernest Mallick, OA 01.06.2021 12:07 PM  Gardiner Sleeper, M.D., Ph.D. Diseases & Surgery of the Retina and [place] 08/17/2019   I have reviewed the above documentation for accuracy and completeness, and I agree with the above. Gardiner Sleeper, M.D., Ph.D. 08/17/19 12:07 PM    Abbreviations: M myopia (nearsighted); A astigmatism; H hyperopia (farsighted); P  presbyopia; Mrx spectacle prescription;  CTL contact lenses; OD right eye; OS left eye; OU both eyes  XT exotropia; ET esotropia; PEK punctate epithelial keratitis; PEE punctate epithelial erosions; DES dry eye syndrome; MGD meibomian gland dysfunction; ATs artificial tears; PFAT's preservative free artificial tears; [place] nuclear sclerotic cataract; PSC posterior subcapsular cataract; ERM epi-retinal membrane; PVD posterior vitreous detachment; RD retinal detachment; DM diabetes mellitus; DR diabetic retinopathy; NPDR non-proliferative diabetic retinopathy; PDR proliferative diabetic retinopathy; CSME clinically significant macular edema; DME diabetic macular edema; dbh dot blot hemorrhages; CWS cotton wool spot; POAG primary open angle glaucoma; C/D cup-to-disc ratio; HVF humphrey visual field; GVF goldmann visual field; OCT optical coherence tomography; IOP intraocular pressure; BRVO Branch retinal vein occlusion; CRVO central retinal vein occlusion; CRAO central retinal artery occlusion; BRAO branch retinal artery occlusion; RT retinal tear; SB scleral buckle; PPV pars plana vitrectomy; VH Vitreous hemorrhage; PRP panretinal laser photocoagulation; IVK intravitreal kenalog; VMT vitreomacular traction; MH Macular hole;  NVD neovascularization of the disc; NVE neovascularization elsewhere; AREDS age related eye disease study; ARMD age related macular degeneration; POAG primary open angle glaucoma; EBMD epithelial/anterior basement membrane dystrophy; ACIOL anterior chamber intraocular lens; IOL intraocular lens; PCIOL posterior chamber intraocular lens; Phaco/IOL phacoemulsification with intraocular lens placement; [place] photorefractive keratectomy; LASIK laser assisted in situ keratomileusis; HTN hypertension; DM diabetes mellitus; COPD chronic obstructive pulmonary disease

## 2019-08-17 ENCOUNTER — Encounter (INDEPENDENT_AMBULATORY_CARE_PROVIDER_SITE_OTHER): Payer: Self-pay | Admitting: Ophthalmology

## 2019-08-17 ENCOUNTER — Ambulatory Visit (INDEPENDENT_AMBULATORY_CARE_PROVIDER_SITE_OTHER): Payer: Managed Care, Other (non HMO) | Admitting: Ophthalmology

## 2019-08-17 DIAGNOSIS — E113513 Type 2 diabetes mellitus with proliferative diabetic retinopathy with macular edema, bilateral: Secondary | ICD-10-CM

## 2019-08-17 DIAGNOSIS — H3343 Traction detachment of retina, bilateral: Secondary | ICD-10-CM | POA: Diagnosis not present

## 2019-08-17 DIAGNOSIS — H4921 Sixth [abducent] nerve palsy, right eye: Secondary | ICD-10-CM

## 2019-08-17 DIAGNOSIS — Z961 Presence of intraocular lens: Secondary | ICD-10-CM

## 2019-08-17 DIAGNOSIS — H3581 Retinal edema: Secondary | ICD-10-CM | POA: Diagnosis not present

## 2019-08-17 DIAGNOSIS — H35033 Hypertensive retinopathy, bilateral: Secondary | ICD-10-CM

## 2019-08-17 DIAGNOSIS — H4313 Vitreous hemorrhage, bilateral: Secondary | ICD-10-CM | POA: Diagnosis not present

## 2019-08-17 DIAGNOSIS — I10 Essential (primary) hypertension: Secondary | ICD-10-CM

## 2019-08-17 DIAGNOSIS — H35352 Cystoid macular degeneration, left eye: Secondary | ICD-10-CM

## 2019-08-17 MED ORDER — AFLIBERCEPT 2MG/0.05ML IZ SOLN FOR KALEIDOSCOPE
2.0000 mg | INTRAVITREAL | Status: AC | PRN
  Administered 2019-08-17: 2 mg via INTRAVITREAL

## 2019-08-22 ENCOUNTER — Encounter (HOSPITAL_COMMUNITY): Payer: Self-pay

## 2019-09-23 ENCOUNTER — Encounter (INDEPENDENT_AMBULATORY_CARE_PROVIDER_SITE_OTHER): Payer: Self-pay

## 2019-09-23 ENCOUNTER — Encounter (INDEPENDENT_AMBULATORY_CARE_PROVIDER_SITE_OTHER): Payer: Managed Care, Other (non HMO) | Admitting: Ophthalmology

## 2019-09-24 ENCOUNTER — Other Ambulatory Visit (HOSPITAL_COMMUNITY): Payer: Self-pay | Admitting: Family Medicine

## 2019-09-26 NOTE — Telephone Encounter (Signed)
Last check up 02/15/19

## 2019-09-26 NOTE — Telephone Encounter (Signed)
May have 30-day with 1 refill on each needs follow-up virtual visit or in person

## 2019-09-27 ENCOUNTER — Encounter (HOSPITAL_COMMUNITY): Payer: Managed Care, Other (non HMO)

## 2019-09-27 ENCOUNTER — Encounter: Payer: Managed Care, Other (non HMO) | Admitting: Vascular Surgery

## 2019-09-27 ENCOUNTER — Other Ambulatory Visit (HOSPITAL_COMMUNITY): Payer: Managed Care, Other (non HMO)

## 2019-10-09 ENCOUNTER — Telehealth: Payer: Self-pay | Admitting: Family Medicine

## 2019-10-09 DIAGNOSIS — N184 Chronic kidney disease, stage 4 (severe): Secondary | ICD-10-CM

## 2019-10-09 DIAGNOSIS — E1122 Type 2 diabetes mellitus with diabetic chronic kidney disease: Secondary | ICD-10-CM

## 2019-10-09 DIAGNOSIS — I10 Essential (primary) hypertension: Secondary | ICD-10-CM

## 2019-10-09 DIAGNOSIS — E1169 Type 2 diabetes mellitus with other specified complication: Secondary | ICD-10-CM

## 2019-10-09 NOTE — Telephone Encounter (Signed)
Nurses This patient is due for checkup Needs to have A1c Micro protein, lipid, liver, metabolic 7 Patient want to do this lab work in the coming 1 to 2 weeks and do a follow-up visit by the end of March preferably either virtual or in person

## 2019-10-10 NOTE — Telephone Encounter (Signed)
Blood work ordered in [employer]. Left message to return call to notify patient and schedule follow up visit the end of March.

## 2019-10-10 NOTE — Addendum Note (Signed)
Addended by: Dairl Ponder on: 10/10/2019 10:29 AM   Modules accepted: Orders

## 2019-10-10 NOTE — Telephone Encounter (Signed)
Patient notified and scheduled follow up office visit in March.

## 2019-10-12 ENCOUNTER — Ambulatory Visit: Payer: Managed Care, Other (non HMO) | Admitting: Podiatry

## 2019-10-28 ENCOUNTER — Other Ambulatory Visit: Payer: Self-pay | Admitting: Family Medicine

## 2019-10-31 NOTE — Telephone Encounter (Signed)
May have 30-day on each needs follow-up visit

## 2019-11-01 NOTE — Telephone Encounter (Signed)
Pt has OV tomorrow.

## 2019-11-02 ENCOUNTER — Ambulatory Visit: Payer: Self-pay | Admitting: Family Medicine

## 2019-11-28 ENCOUNTER — Telehealth: Payer: Self-pay | Admitting: Family Medicine

## 2019-11-28 NOTE — Telephone Encounter (Signed)
Left message to return call 

## 2019-11-28 NOTE — Telephone Encounter (Signed)
Pt and pt's son calling to ask if it is ok that he get the covid vaccine with him being a diabetic.   Also he is having some leg swelling as the day goes on. It is fine at night and in the morning but as the day goes on he is noticing some swelling he states this has been an issue before in the past but now it is happening more frequently.   Also he is noticing some night sweats after eating dinner.   He just wants to make sure he is ok to get the vaccine with the issues he has going on.   Return call to him or his sons cell [phone] son cell)

## 2019-11-30 ENCOUNTER — Telehealth: Payer: Self-pay | Admitting: Family Medicine

## 2019-11-30 ENCOUNTER — Encounter: Payer: Self-pay | Admitting: Family Medicine

## 2019-11-30 ENCOUNTER — Other Ambulatory Visit (HOSPITAL_COMMUNITY)
Admission: RE | Admit: 2019-11-30 | Discharge: 2019-11-30 | Disposition: A | Discharge status: Home / Self Care | Payer: Self-pay | Source: Ambulatory Visit | Attending: Family Medicine | Admitting: Family Medicine

## 2019-11-30 ENCOUNTER — Ambulatory Visit (INDEPENDENT_AMBULATORY_CARE_PROVIDER_SITE_OTHER): Payer: Self-pay | Admitting: Family Medicine

## 2019-11-30 ENCOUNTER — Other Ambulatory Visit: Payer: Self-pay

## 2019-11-30 VITALS — BP 140/90 | Temp 98.3°F | Wt 233.2 lb

## 2019-11-30 DIAGNOSIS — E119 Type 2 diabetes mellitus without complications: Secondary | ICD-10-CM

## 2019-11-30 DIAGNOSIS — I10 Essential (primary) hypertension: Secondary | ICD-10-CM | POA: Insufficient documentation

## 2019-11-30 DIAGNOSIS — R600 Localized edema: Secondary | ICD-10-CM | POA: Insufficient documentation

## 2019-11-30 DIAGNOSIS — E1142 Type 2 diabetes mellitus with diabetic polyneuropathy: Secondary | ICD-10-CM

## 2019-11-30 DIAGNOSIS — E113553 Type 2 diabetes mellitus with stable proliferative diabetic retinopathy, bilateral: Secondary | ICD-10-CM

## 2019-11-30 LAB — CBC WITH DIFFERENTIAL/PLATELET
Abs Immature Granulocytes: 0.03 10*3/uL (ref 0.00–0.07)
Basophils Absolute: 0 10*3/uL (ref 0.0–0.1)
Basophils Relative: 0 %
Eosinophils Absolute: 0.2 10*3/uL (ref 0.0–0.5)
Eosinophils Relative: 3 %
HCT: 28.4 % — ABNORMAL LOW (ref 39.0–52.0)
Hemoglobin: 8.8 g/dL — ABNORMAL LOW (ref 13.0–17.0)
Immature Granulocytes: 1 %
Lymphocytes Relative: 20 %
Lymphs Abs: 1.1 10*3/uL (ref 0.7–4.0)
MCH: 27.1 pg (ref 26.0–34.0)
MCHC: 31 g/dL (ref 30.0–36.0)
MCV: 87.4 fL (ref 80.0–100.0)
Monocytes Absolute: 0.6 10*3/uL (ref 0.1–1.0)
Monocytes Relative: 10 %
Neutro Abs: 3.8 10*3/uL (ref 1.7–7.7)
Neutrophils Relative %: 66 %
Platelets: 178 10*3/uL (ref 150–400)
RBC: 3.25 MIL/uL — ABNORMAL LOW (ref 4.22–5.81)
RDW: 13.2 % (ref 11.5–15.5)
WBC: 5.6 10*3/uL (ref 4.0–10.5)
nRBC: 0 % (ref 0.0–0.2)

## 2019-11-30 LAB — LIPID PANEL
Cholesterol: 138 mg/dL (ref 0–200)
HDL: 63 mg/dL (ref >40)
LDL Cholesterol: 67 mg/dL (ref 0–99)
Total CHOL/HDL Ratio: 2.2 RATIO
Triglycerides: 42 mg/dL (ref <150)
VLDL: 8 mg/dL (ref 0–40)

## 2019-11-30 LAB — BASIC METABOLIC PANEL
Anion gap: 10 (ref 5–15)
BUN: 74 mg/dL — ABNORMAL HIGH (ref 6–20)
CO2: 20 mmol/L — ABNORMAL LOW (ref 22–32)
Calcium: 8.3 mg/dL — ABNORMAL LOW (ref 8.9–10.3)
Chloride: 109 mmol/L (ref 98–111)
Creatinine, Ser: 7.46 mg/dL — ABNORMAL HIGH (ref 0.61–1.24)
GFR calc Af Amer: 9 mL/min — ABNORMAL LOW (ref >60)
GFR calc non Af Amer: 8 mL/min — ABNORMAL LOW (ref >60)
Glucose, Bld: 175 mg/dL — ABNORMAL HIGH (ref 70–99)
Potassium: 4.8 mmol/L (ref 3.5–5.1)
Sodium: 139 mmol/L (ref 135–145)

## 2019-11-30 LAB — HEMOGLOBIN A1C
Hgb A1c MFr Bld: 8.8 % — ABNORMAL HIGH (ref 4.8–5.6)
Mean Plasma Glucose: 205.86 mg/dL

## 2019-11-30 LAB — BRAIN NATRIURETIC PEPTIDE: B Natriuretic Peptide: 212 pg/mL — ABNORMAL HIGH (ref 0.0–100.0)

## 2019-11-30 MED ORDER — FUROSEMIDE 40 MG PO TABS: ORAL_TABLET | ORAL | 5 refills | Status: DC

## 2019-11-30 NOTE — Telephone Encounter (Signed)
Please advise. Thank you

## 2019-11-30 NOTE — Progress Notes (Signed)
   Subjective:    Patient ID: Randy Oneal, male    DOB: 05/11/1966, 54 y.o.   MRN: [id number]  HPI  Patient arrives to discuss swelling in his legs during certain times of the day- especially at night. Patient relates significant amount of swelling in his legs He thinks he was taking his diuretic but then he admits he wasn't taking it so therefore he is open to starting it back again he thinks the last time he took it was a week ago but he is not certain Patient also states that when he eats his evening meal he experiences sweating on the back of his neck and head. Patient denies substernal chest tightness pressure pain denies nausea vomiting diarrhea Denies any low sugar spells states his sugar he thinks are doing well but he does not check them often Review of Systems Denies any chest tightness pressure pain shortness of breath does relate difficulty seeing because of his retinopathy issues and getting shots in his eyes    Objective:   Physical Exam Patient has severe swelling in both legs lungs are clear no crackles heart regular abdomen is soft       Assessment & Plan:  1. Diabetic polyneuropathy associated with type 2 diabetes mellitus ([place]) Diabetes subpar control check A1c patient does not have insurance which is impeding his ability to care for himself I encouraged him to apply for disability plus also apply cold to help for assistance  2. Stable proliferative diabetic retinopathy of both eyes associated with type 2 diabetes mellitus ([place]) Patient has visual deficits because of this was released from his job  3. Type 2 diabetes mellitus not at goal [employer]) Is on insulin shots 4 times a day A1c has never been under good control patient with moderate compliance issues states he is doing better - Basic metabolic panel - Hemoglobin A1c - Microalbumin / creatinine urine ratio  4. HTN (hypertension), benign Blood pressure mildly elevated important for patient to take his  medicine he states he is taking his medicines but has a difficult time naming them therefore when he comes back here bring all of his medicines with - CBC with Differential/Platelet - Lipid panel  5. Pedal edema Severe swelling in the lower legs he needs to do lab work.  We also need to rule out the possibility of heart failure More than likely related to diabetes and advancing kidney disease Lasix 40 mg one every morning but not dramatic improvement increase it to 2 pills each morning in 1 week Follow-up patient here in 2 weeks bring all medicines with him - Brain natriuretic peptide 30 minutes spent with patient discussing multiple issues discussing disability also discussing what I'm concerned about regarding significant renal problems with restart diuretic follow-up within 2 weeks bring medications with him when he comes

## 2019-11-30 NOTE — Telephone Encounter (Signed)
Pt in office for visit; pt was informed

## 2019-11-30 NOTE — Telephone Encounter (Signed)
Labs got posted today Creatinine significantly elevated Patient still urinating but GFR less than 10 With patient having severe swelling of the legs as well as on his last visit with [facility] in January they discussed dialysis Patient's lab work is critical Therefore go to ER for further evaluation more than likely admitting for emergency dialysis on Thursday with consultation with [facility] ER was spoken with Patient was spoken with Patient agrees to go to ER Patient with underlying hypertension diabetes and diabetic kidney disease

## 2019-11-30 NOTE — Telephone Encounter (Signed)
I believe it would be fine for the patient to get the shot.

## 2019-11-30 NOTE — Telephone Encounter (Signed)
Patient forgot to ask Dr. Nicki Reaper this morning if he thinks it's ok for patient to get Covid vaccine?  [phone] use this number to call patient back today, it's his son's cell phone.

## 2019-11-30 NOTE — Telephone Encounter (Signed)
Per other phone message, it is OK for patient to get vaccine. Informed patient and pt verbalized understanding.

## 2019-12-01 ENCOUNTER — Inpatient Hospital Stay (HOSPITAL_COMMUNITY)
Admission: EM | Admit: 2019-12-01 | Discharge: 2019-12-08 | DRG: 674 | Disposition: A | Discharge status: Home / Self Care | Payer: Self-pay | Attending: Internal Medicine | Admitting: Internal Medicine

## 2019-12-01 ENCOUNTER — Encounter (HOSPITAL_COMMUNITY): Payer: Self-pay | Admitting: Emergency Medicine

## 2019-12-01 ENCOUNTER — Other Ambulatory Visit: Payer: Self-pay

## 2019-12-01 ENCOUNTER — Emergency Department (HOSPITAL_COMMUNITY): Payer: Self-pay

## 2019-12-01 DIAGNOSIS — N2581 Secondary hyperparathyroidism of renal origin: Secondary | ICD-10-CM | POA: Diagnosis present

## 2019-12-01 DIAGNOSIS — Z20822 Contact with and (suspected) exposure to covid-19: Secondary | ICD-10-CM | POA: Diagnosis present

## 2019-12-01 DIAGNOSIS — Z79899 Other long term (current) drug therapy: Secondary | ICD-10-CM

## 2019-12-01 DIAGNOSIS — E785 Hyperlipidemia, unspecified: Secondary | ICD-10-CM | POA: Diagnosis present

## 2019-12-01 DIAGNOSIS — D631 Anemia in chronic kidney disease: Secondary | ICD-10-CM | POA: Diagnosis present

## 2019-12-01 DIAGNOSIS — Z8249 Family history of ischemic heart disease and other diseases of the circulatory system: Secondary | ICD-10-CM

## 2019-12-01 DIAGNOSIS — E1140 Type 2 diabetes mellitus with diabetic neuropathy, unspecified: Secondary | ICD-10-CM | POA: Diagnosis present

## 2019-12-01 DIAGNOSIS — T8242XA Displacement of vascular dialysis catheter, initial encounter: Secondary | ICD-10-CM

## 2019-12-01 DIAGNOSIS — E11319 Type 2 diabetes mellitus with unspecified diabetic retinopathy without macular edema: Secondary | ICD-10-CM | POA: Diagnosis present

## 2019-12-01 DIAGNOSIS — E119 Type 2 diabetes mellitus without complications: Secondary | ICD-10-CM

## 2019-12-01 DIAGNOSIS — N184 Chronic kidney disease, stage 4 (severe): Secondary | ICD-10-CM

## 2019-12-01 DIAGNOSIS — N186 End stage renal disease: Secondary | ICD-10-CM

## 2019-12-01 DIAGNOSIS — T82898A Other specified complication of vascular prosthetic devices, implants and grafts, initial encounter: Secondary | ICD-10-CM

## 2019-12-01 DIAGNOSIS — E8889 Other specified metabolic disorders: Secondary | ICD-10-CM | POA: Diagnosis present

## 2019-12-01 DIAGNOSIS — E8779 Other fluid overload: Secondary | ICD-10-CM | POA: Diagnosis present

## 2019-12-01 DIAGNOSIS — E1169 Type 2 diabetes mellitus with other specified complication: Secondary | ICD-10-CM | POA: Diagnosis present

## 2019-12-01 DIAGNOSIS — I10 Essential (primary) hypertension: Secondary | ICD-10-CM

## 2019-12-01 DIAGNOSIS — Z992 Dependence on renal dialysis: Secondary | ICD-10-CM

## 2019-12-01 DIAGNOSIS — Z794 Long term (current) use of insulin: Secondary | ICD-10-CM

## 2019-12-01 DIAGNOSIS — E1122 Type 2 diabetes mellitus with diabetic chronic kidney disease: Secondary | ICD-10-CM | POA: Diagnosis present

## 2019-12-01 DIAGNOSIS — N179 Acute kidney failure, unspecified: Principal | ICD-10-CM

## 2019-12-01 DIAGNOSIS — E11649 Type 2 diabetes mellitus with hypoglycemia without coma: Secondary | ICD-10-CM | POA: Diagnosis not present

## 2019-12-01 DIAGNOSIS — N189 Chronic kidney disease, unspecified: Secondary | ICD-10-CM

## 2019-12-01 DIAGNOSIS — K219 Gastro-esophageal reflux disease without esophagitis: Secondary | ICD-10-CM | POA: Diagnosis present

## 2019-12-01 DIAGNOSIS — I120 Hypertensive chronic kidney disease with stage 5 chronic kidney disease or end stage renal disease: Secondary | ICD-10-CM | POA: Diagnosis present

## 2019-12-01 LAB — MICROALBUMIN / CREATININE URINE RATIO
Creatinine, Urine: 77.6 mg/dL
Microalb Creat Ratio: 4825 mg/g creat — ABNORMAL HIGH (ref 0–29)
Microalb, Ur: 3744.2 ug/mL — ABNORMAL HIGH

## 2019-12-01 LAB — GLUCOSE, CAPILLARY
Glucose-Capillary: 158 mg/dL — ABNORMAL HIGH (ref 70–99)
Glucose-Capillary: 162 mg/dL — ABNORMAL HIGH (ref 70–99)
Glucose-Capillary: 166 mg/dL — ABNORMAL HIGH (ref 70–99)
Glucose-Capillary: 161 mg/dL — ABNORMAL HIGH (ref 70–99)
Glucose-Capillary: 92 mg/dL (ref 70–99)

## 2019-12-01 LAB — BASIC METABOLIC PANEL
Anion gap: 11 (ref 5–15)
Anion gap: 10 (ref 5–15)
BUN: 77 mg/dL — ABNORMAL HIGH (ref 6–20)
BUN: 79 mg/dL — ABNORMAL HIGH (ref 6–20)
CO2: 19 mmol/L — ABNORMAL LOW (ref 22–32)
CO2: 18 mmol/L — ABNORMAL LOW (ref 22–32)
Calcium: 7.8 mg/dL — ABNORMAL LOW (ref 8.9–10.3)
Calcium: 7.6 mg/dL — ABNORMAL LOW (ref 8.9–10.3)
Chloride: 108 mmol/L (ref 98–111)
Chloride: 110 mmol/L (ref 98–111)
Creatinine, Ser: 7.71 mg/dL — ABNORMAL HIGH (ref 0.61–1.24)
Creatinine, Ser: 8.05 mg/dL — ABNORMAL HIGH (ref 0.61–1.24)
GFR calc Af Amer: 8 mL/min — ABNORMAL LOW (ref >60)
GFR calc Af Amer: 8 mL/min — ABNORMAL LOW (ref >60)
GFR calc non Af Amer: 7 mL/min — ABNORMAL LOW (ref >60)
GFR calc non Af Amer: 7 mL/min — ABNORMAL LOW (ref >60)
Glucose, Bld: 190 mg/dL — ABNORMAL HIGH (ref 70–99)
Glucose, Bld: 186 mg/dL — ABNORMAL HIGH (ref 70–99)
Potassium: 4.2 mmol/L (ref 3.5–5.1)
Potassium: 4 mmol/L (ref 3.5–5.1)
Sodium: 138 mmol/L (ref 135–145)
Sodium: 138 mmol/L (ref 135–145)

## 2019-12-01 LAB — RESPIRATORY PANEL BY RT PCR (FLU A&B, COVID)
Influenza A by PCR: NEGATIVE
Influenza B by PCR: NEGATIVE
SARS Coronavirus 2 by RT PCR: NEGATIVE

## 2019-12-01 LAB — CBC
HCT: 25.4 % — ABNORMAL LOW (ref 39.0–52.0)
Hemoglobin: 7.8 g/dL — ABNORMAL LOW (ref 13.0–17.0)
MCH: 26.7 pg (ref 26.0–34.0)
MCHC: 30.7 g/dL (ref 30.0–36.0)
MCV: 87 fL (ref 80.0–100.0)
Platelets: 155 10*3/uL (ref 150–400)
RBC: 2.92 MIL/uL — ABNORMAL LOW (ref 4.22–5.81)
RDW: 13.2 % (ref 11.5–15.5)
WBC: 5.6 10*3/uL (ref 4.0–10.5)
nRBC: 0 % (ref 0.0–0.2)

## 2019-12-01 LAB — HIV ANTIBODY (ROUTINE TESTING W REFLEX): HIV Screen 4th Generation wRfx: NONREACTIVE

## 2019-12-01 MED ORDER — SIMETHICONE 80 MG PO CHEW: 80.0000 mg | CHEWABLE_TABLET | Freq: Four times a day (QID) | ORAL | Status: DC | PRN

## 2019-12-01 MED ORDER — INSULIN GLARGINE 100 UNIT/ML ~~LOC~~ SOLN
22.0000 [IU] | Freq: Every day | SUBCUTANEOUS | Status: DC
  Administered 2019-12-02: 22 [IU] via SUBCUTANEOUS
  Filled 2019-12-01 (×4): qty 0.22

## 2019-12-01 MED ORDER — INSULIN ASPART 100 UNIT/ML ~~LOC~~ SOLN
0.0000 [IU] | Freq: Every day | SUBCUTANEOUS | Status: DC
  Administered 2019-12-04: 2 [IU] via SUBCUTANEOUS

## 2019-12-01 MED ORDER — INSULIN ASPART 100 UNIT/ML ~~LOC~~ SOLN
0.0000 [IU] | Freq: Three times a day (TID) | SUBCUTANEOUS | Status: DC
  Administered 2019-12-01 – 2019-12-02 (×4): 3 [IU] via SUBCUTANEOUS
  Administered 2019-12-02: 5 [IU] via SUBCUTANEOUS
  Administered 2019-12-04: 3 [IU] via SUBCUTANEOUS

## 2019-12-01 MED ORDER — LABETALOL HCL 5 MG/ML IV SOLN
10.0000 mg | Freq: Once | INTRAVENOUS | Status: AC
  Administered 2019-12-01: 10 mg via INTRAVENOUS
  Filled 2019-12-01: qty 4

## 2019-12-01 MED ORDER — ATORVASTATIN CALCIUM 10 MG PO TABS
10.0000 mg | ORAL_TABLET | Freq: Every day | ORAL | Status: DC
  Administered 2019-12-01 – 2019-12-08 (×7): 10 mg via ORAL
  Filled 2019-12-01 (×8): qty 1

## 2019-12-01 MED ORDER — PANTOPRAZOLE SODIUM 40 MG PO TBEC
40.0000 mg | DELAYED_RELEASE_TABLET | Freq: Every day | ORAL | Status: DC
  Administered 2019-12-01 – 2019-12-08 (×7): 40 mg via ORAL
  Filled 2019-12-01 (×9): qty 1

## 2019-12-01 MED ORDER — HEPARIN SODIUM (PORCINE) 5000 UNIT/ML IJ SOLN
5000.0000 [IU] | Freq: Three times a day (TID) | INTRAMUSCULAR | Status: DC
  Administered 2019-12-01 – 2019-12-04 (×10): 5000 [IU] via SUBCUTANEOUS
  Filled 2019-12-01 (×10): qty 1

## 2019-12-01 MED ORDER — FUROSEMIDE 10 MG/ML IJ SOLN
80.0000 mg | Freq: Four times a day (QID) | INTRAMUSCULAR | Status: DC
  Administered 2019-12-01 – 2019-12-02 (×5): 80 mg via INTRAVENOUS
  Filled 2019-12-01 (×5): qty 8

## 2019-12-01 MED ORDER — SODIUM BICARBONATE 650 MG PO TABS
650.0000 mg | ORAL_TABLET | Freq: Two times a day (BID) | ORAL | Status: DC
  Administered 2019-12-01 – 2019-12-05 (×9): 650 mg via ORAL
  Filled 2019-12-01 (×11): qty 1

## 2019-12-01 MED ORDER — CALCITRIOL 0.25 MCG PO CAPS
0.2500 ug | ORAL_CAPSULE | ORAL | Status: DC
  Administered 2019-12-02 – 2019-12-07 (×2): 0.25 ug via ORAL
  Filled 2019-12-01 (×2): qty 1

## 2019-12-01 MED ORDER — POLYETHYLENE GLYCOL 3350 17 G PO PACK: 17.0000 g | PACK | Freq: Every day | ORAL | Status: DC | PRN

## 2019-12-01 MED ORDER — ONDANSETRON HCL 4 MG PO TABS: 4.0000 mg | ORAL_TABLET | Freq: Four times a day (QID) | ORAL | Status: DC | PRN

## 2019-12-01 MED ORDER — SODIUM CHLORIDE 0.9 % IV SOLN: 250.0000 mL | INTRAVENOUS | Status: DC | PRN

## 2019-12-01 MED ORDER — SODIUM CHLORIDE 0.9% FLUSH
3.0000 mL | INTRAVENOUS | Status: DC | PRN
  Administered 2019-12-01: 3 mL via INTRAVENOUS

## 2019-12-01 MED ORDER — AMLODIPINE BESYLATE 5 MG PO TABS
10.0000 mg | ORAL_TABLET | Freq: Every day | ORAL | Status: DC
  Administered 2019-12-01 – 2019-12-08 (×6): 10 mg via ORAL
  Filled 2019-12-01 (×7): qty 2

## 2019-12-01 MED ORDER — SODIUM CHLORIDE 0.9% FLUSH
3.0000 mL | Freq: Two times a day (BID) | INTRAVENOUS | Status: DC
  Administered 2019-12-01 – 2019-12-08 (×13): 3 mL via INTRAVENOUS

## 2019-12-01 MED ORDER — BISACODYL 10 MG RE SUPP: 10.0000 mg | Freq: Every day | RECTAL | Status: DC | PRN

## 2019-12-01 MED ORDER — METOPROLOL TARTRATE 25 MG PO TABS
25.0000 mg | ORAL_TABLET | Freq: Two times a day (BID) | ORAL | Status: DC
  Administered 2019-12-01 – 2019-12-08 (×13): 25 mg via ORAL
  Filled 2019-12-01 (×14): qty 1

## 2019-12-01 MED ORDER — HYDRALAZINE HCL 25 MG PO TABS
50.0000 mg | ORAL_TABLET | Freq: Three times a day (TID) | ORAL | Status: DC
  Administered 2019-12-01 – 2019-12-07 (×15): 50 mg via ORAL
  Filled 2019-12-01 (×16): qty 2

## 2019-12-01 MED ORDER — FAMOTIDINE 20 MG PO TABS: 20.0000 mg | ORAL_TABLET | Freq: Every day | ORAL | Status: DC | PRN

## 2019-12-01 MED ORDER — FUROSEMIDE 10 MG/ML IJ SOLN
80.0000 mg | Freq: Once | INTRAMUSCULAR | Status: AC
  Administered 2019-12-01: 80 mg via INTRAVENOUS
  Filled 2019-12-01: qty 8

## 2019-12-01 MED ORDER — ONDANSETRON HCL 4 MG/2ML IJ SOLN
4.0000 mg | Freq: Four times a day (QID) | INTRAMUSCULAR | Status: DC | PRN
  Administered 2019-12-08: 4 mg via INTRAVENOUS

## 2019-12-01 NOTE — Plan of Care (Signed)
  Problem: Education: Goal: Knowledge of General Education information will improve Description: Including pain rating scale, medication(s)/side effects and non-pharmacologic comfort measures Outcome: Progressing   Problem: Nutrition: Goal: Adequate nutrition will be maintained Outcome: Progressing   Problem: Elimination: Goal: Will not experience complications related to urinary retention Outcome: Progressing   

## 2019-12-01 NOTE — H&P (Signed)
History and Physical    Patient Demographics:    Randy Oneal JXB:[id number] DOB: [birth year] DOA: 12/01/2019  PCP: Randy Drown, MD  Patient coming from: Home  I have personally briefly reviewed patient's old medical records in [place]  Chief Complaint: Bilateral lower extremity swelling, shortness of breath   Assessment & Plan:     Assessment/Plan Principal Problem:   Acute on chronic renal failure (HCC) Active Problems:   Type 2 diabetes mellitus not at goal [facility])   HTN (hypertension), benign   Hyperlipidemia associated with type 2 diabetes mellitus ([place])   Diabetic neuropathy ([place])     Principal Problem: Fluid overload with worsening chronic renal failure  Patient presented with worsening bilateral lower extremity swelling, worsening renal function. Follows with nephrology as outpatient, was supposed to have AV fistula placed but could not follow up as he lost his insurance. Had labs drawn as outpatient which showed worsening renal function and was sent in to the ER.  -Continue sodium bicarbonate -Nephrology consult in a.m. -We will place on high-dose IV Lasix  Other Active Problems: Diabetes mellitus type 2 with neuropathy, retinopathy -Continue Tresiba -Sliding scale insulin coverage with fingerstick monitoring  Hypertension -Continue amlodipine, hydralazine, metoprolol  Hyperlipidemia -Continue atorvastatin  GERD -Continue pantoprazole  Chronic normocytic anemia Hemoglobin 8.8.  Likely anemia of chronic disease.  Stable, no evidence of bleeding. -Monitor CBC  DVT prophylaxis: Heparin Code Status:  Full code Family Communication: N/A  Disposition Plan: Admit as inpatient for worsening renal failure, fluid overload Consults called: N/A Admission status: Inpatient status    HPI:     HPI: Randy Oneal is a 54 y.o. male with medical history significant of hypertension, diabetes mellitus type 2, retinopathy, neuropathy, chronic kidney  disease who presented to the ER with worsening bilateral lower extremity swelling over the past 1 week.  Patient also reports having some dyspnea on exertion. Found to have uncontrolled blood pressure on presentation to the ER.  Follows with nephrology with Dr. Royce Oneal as outpatient.  He was supposed to have AV fistula placed for possible dialysis as outpatient on most recent visit in February.  However he was unable to get that set up as he lost his insurance. No chest pain, palpitations, fever, chills, cough, nausea, vomiting, abdominal pain, diarrhea. ED Course:  Vital Signs reviewed on presentation, significant for temperature 98, heart rate 88, blood pressure 185/91, saturation 95% on room air. Labs reviewed, significant for sodium 138, potassium 4.2, BUN 77, creatinine 7.7, bicarb 19, BNP 212, WBC count 5.6, hemoglobin 8.8, hematocrit 28, platelets 178. Imaging personally Reviewed, chest x-ray shows no evidence of acute cardiopulmonary disease. EKG personally reviewed, shows sinus rhythm, no acute ST-T changes.    Review of systems:    Review of Systems: As per HPI otherwise 10 point review of systems negative.  All other review of systems is negative except the ones noted above in the HPI.    Past Medical and Surgical History:  Reviewed by me  Past Medical History:  Diagnosis Date  . Diabetes mellitus    Type 2   . Diabetic retinopathy of both eyes ([place]) 01/31/2019   Dr Randy Oneal 01/2019  . Herpes simplex virus (HSV) infection    OU  . Hypertension   . Renal insufficiency    ckd stage 4  . Retinal detachment    OS  . Sepsis due to Streptococcus, group B ([place]) 10/05/2018    Past Surgical History:  Procedure Laterality Date  .  [place] VITRECTOMY WITH 20 GAUGE MVR PORT FOR MACULAR HOLE Right 02/24/2019   Procedure: 25 GAUGE PARS PLANA VITRECTOMY WITH 20 GAUGE MVR PORT FOR MACULAR HOLE;  Surgeon: Randy Caffey, MD;  Location: [place];  Service: Ophthalmology;  Laterality:  Right;  . APPLICATION OF WOUND VAC Left 08/27/2018   Procedure: APPLICATION OF WOUND VAC, left neck;  Surgeon: Randy Pollack, MD;  Location: [place];  Service: Thoracic;  Laterality: Left;  . APPLICATION OF WOUND VAC Left 08/30/2018   Procedure: APPLICATION OF WOUND VAC;  Surgeon: Randy Pollack, MD;  Location: [place];  Service: Thoracic;  Laterality: Left;  . CATARACT EXTRACTION Bilateral   . CATARACT EXTRACTION W/ INTRAOCULAR LENS  IMPLANT, BILATERAL    . COLONOSCOPY  06/24/2011   Procedure: COLONOSCOPY;  Surgeon: Randy Peng, MD;  [facility];  Service: Endoscopy;  Laterality: N/A;  8:30 AM  . EYE SURGERY    . GAS/FLUID EXCHANGE Left 03/31/2019   Procedure: Gas/Fluid Exchange;  Surgeon: Randy Caffey, MD;  Location: [place];  Service: Ophthalmology;  Laterality: Left;  . INJECTION OF SILICONE OIL  7/67/3419   Procedure: Injection Of Silicone Oil;  Surgeon: Randy Caffey, MD;  Location: [place];  Service: Ophthalmology;;  . MEMBRANE PEEL Right 02/24/2019   Procedure: Randy Oneal;  Surgeon: Randy Caffey, MD;  Location: [place];  Service: Ophthalmology;  Laterality: Right;  . PARS PLANA VITRECTOMY Left 03/31/2019   Procedure: PARS PLANA VITRECTOMY WITH 25 GAUGE WITH MEMBRANE PEEL;  Surgeon: Randy Caffey, MD;  Location: [place];  Service: Ophthalmology;  Laterality: Left;  . PHOTOCOAGULATION WITH LASER Right 02/24/2019   Procedure: Photocoagulation With Laser;  Surgeon: Randy Caffey, MD;  Location: [place];  Service: Ophthalmology;  Laterality: Right;  . PHOTOCOAGULATION WITH LASER Left 03/31/2019   Procedure: Photocoagulation With Laser;  Surgeon: Randy Caffey, MD;  Location: [place];  Service: Ophthalmology;  Laterality: Left;  . RETINAL DETACHMENT SURGERY Left 03/31/2019   TRD Repair - Dr. Bernarda Oneal  . SILICON OIL REMOVAL Right 3/79/0240   Procedure: Silicon Oil Removal;  Surgeon: Randy Caffey, MD;  Location: [place];  Service: Ophthalmology;  Laterality: Right;  . STERNAL WOUND  DEBRIDEMENT Left 08/27/2018   Procedure: Incision and DEBRIDEMENT Left Chest, Neck and Mediastinum;  Surgeon: Randy Pollack, MD;  Location[facility];  Service: Thoracic;  Laterality: Left;  . STERNAL WOUND DEBRIDEMENT Left 08/30/2018   Procedure: WOUND VAC CHANGE, LEFT CHEST AND NECK, POSSIBLE DEBRIDEMENT;  Surgeon: Randy Pollack, MD;  Location: [place] OR;  Service: Thoracic;  Laterality: Left;     Social History:  Reviewed by me   reports that he has never smoked. He has never used smokeless tobacco. He reports that he does not drink alcohol or use drugs.  Allergies:    Allergies  Allergen Reactions  . Tramadol Nausea And Vomiting    Family History :   Family History  Problem Relation Age of Onset  . Hypertension Mother   . Colon cancer Neg Hx    Family history reviewed, noted as above, not pertinent to current presentation.   Home Medications:    Prior to Admission medications   Medication Sig Start Date End Date Taking? Authorizing Provider  acetaminophen (TYLENOL) 500 MG tablet Take 1,000 mg by mouth every 6 (six) hours as needed for moderate pain or headache.    [provider]  amLODipine (NORVASC) 10 MG tablet TAKE (1) TABLET BY MOUTH ONCE DAILY.  11/01/19   Randy Drown, MD  atorvastatin (LIPITOR) 10 MG tablet TAKE ONE TABLET BY MOUTH ONCE DAILY. 11/01/19   Randy Drown, MD  bacitracin-polymyxin b (POLYSPORIN) ophthalmic ointment Place 1 application into both eyes at bedtime. Patient taking differently: Place 1 application into both eyes 4 (four) times daily.  04/22/19   Randy Caffey, MD  brimonidine (ALPHAGAN) 0.2 % ophthalmic solution Place 1 drop into both eyes daily. 06/13/19   Randy Caffey, MD  Bromfenac Sodium (PROLENSA) 0.07 % SOLN Place 1 drop into both eyes 4 (four) times daily. 06/13/19   Randy Caffey, MD  calcitRIOL (ROCALTROL) 0.25 MCG capsule Take 0.25 mcg by mouth 3 (three) times a week. 08/13/19   [provider]  Continuous Blood Gluc  Sensor (FREESTYLE LIBRE SENSOR SYSTEM) MISC Use one sensor every 10 days. 08/10/17   [name], MD  famotidine (PEPCID) 40 MG tablet Take one tablet by mouth daily Patient taking differently: Take 40 mg by mouth daily as needed for heartburn.  02/16/19   Randy Drown, MD  furosemide (LASIX) 40 MG tablet One to 2 tablets qam 11/30/19   Luking, [name], MD  gatifloxacin (ZYMAXID) 0.5 % SOLN Place 1 drop into both eyes 4 (four) times daily.    [provider]  glucose blood (BAYER CONTOUR NEXT TEST) test strip USE TO TEST FOUR TIMES DAILY. 07/28/17   [name], MD  hydrALAZINE (APRESOLINE) 50 MG tablet Take 50 mg by mouth 3 (three) times daily. 07/22/19   [provider]  insulin lispro (HUMALOG KWIKPEN) 100 UNIT/ML KwikPen Inject 0.06 mLs (6 Units total) into the skin daily with breakfast AND 0.07 mLs (7 Units total) 2 (two) times daily before lunch and supper. Patient taking differently: inject 5-9 unit with each meal 11/17/18   Shamleffer, Melanie Crazier, MD  Insulin Pen Needle (BD PEN NEEDLE NANO 2ND GEN) 32G X 4 MM MISC Four times daily 10/20/18   Shamleffer, Melanie Crazier, MD  LOKELMA 10 g PACK packet SMARTSIG:1 Packet(s) By Mouth 3 Times a Week 07/19/19   [provider]  metoprolol tartrate (LOPRESSOR) 50 MG tablet Take one half tablet by mouth twice daily Patient taking differently: Take 25 mg by mouth 2 (two) times daily.  09/28/18   Randy Drown, MD  MICROLET LANCETS MISC USE FOUR TIMES A DAY AS DIRECTED. 11/19/16   [name], MD  nystatin (MYCOSTATIN/NYSTOP) powder Apply topically 2 (two) times daily. To groin and scrotum Patient taking differently: Apply 1 g topically 2 (two) times daily as needed (rash). To groin and scrotum 09/04/18   Barrett, Erin R, PA-C  pantoprazole (PROTONIX) 40 MG tablet Take 1 tablet (40 mg total) by mouth daily. 10/14/18 10/14/19  Randy Drown, MD  prednisoLONE acetate (PRED FORTE) 1 % ophthalmic  suspension Place 1 drop into both eyes 4 (four) times daily. 07/15/19   Randy Caffey, MD  sildenafil (REVATIO) 20 MG tablet May take up to 5 before relations as directed 03/04/17   Randy Drown, MD  Simethicone (GAS-X PO) Take 1 tablet by mouth daily as needed (gas).    [provider]  sodium bicarbonate 650 MG tablet Take 650 mg by mouth 2 (two) times daily. 07/18/19   [provider]  TRESIBA FLEXTOUCH 100 UNIT/ML SOPN FlexTouch Pen Inject 0.19 mLs (19 Units total) into the skin at bedtime. Patient taking differently: Inject 22 Units into the skin at bedtime.  11/17/18   Shamleffer, Melanie Crazier,  MD  Vitamin D, Ergocalciferol, (DRISDOL) 1.25 MG (50000 UT) CAPS capsule Take 1.25 Units by mouth once a week. 05/16/19   [provider]    Physical Exam:    Physical Exam: Vitals:   12/01/19 0049 12/01/19 0130 12/01/19 0200 12/01/19 0210  BP: (!) 207/107 (!) 218/186 (!) 179/91 (!) 177/91  Pulse: 90  87 88  Resp: 17  16 14   Temp: 98 F (36.7 C)     TempSrc: Oral     SpO2: 97%  94% 95%  Weight:      Height:        Constitutional: NAD, calm, comfortable Vitals:   12/01/19 0049 12/01/19 0130 12/01/19 0200 12/01/19 0210  BP: (!) 207/107 (!) 218/186 (!) 179/91 (!) 177/91  Pulse: 90  87 88  Resp: 17  16 14   Temp: 98 F (36.7 C)     TempSrc: Oral     SpO2: 97%  94% 95%  Weight:      Height:       Eyes: PERRL, lids and conjunctivae normal, periorbital edema noted ENMT: Mucous membranes are moist. Posterior pharynx clear of any exudate or lesions.Normal dentition.  Neck: normal, supple, no masses, no thyromegaly Respiratory: clear to auscultation bilaterally, no wheezing, no crackles. Normal respiratory effort. No accessory muscle use.  Cardiovascular: Regular rate and rhythm, no murmurs / rubs / gallops. Bilatearal 3+ extremity edema. 2+ pedal pulses. No carotid bruits.  Abdomen: no tenderness, no masses palpated. No hepatosplenomegaly. Bowel sounds  positive.  Musculoskeletal: no clubbing / cyanosis. No joint deformity upper and lower extremities. Good ROM, no contractures. Normal muscle tone.  Skin: no rashes, lesions, ulcers. No induration Neurologic: CN 2-12 grossly intact. Sensation intact, DTR normal. Strength 5/5 in all 4.  Psychiatric: Normal judgment and insight. Alert and oriented x 3. Normal mood.    Decubitus Ulcers: Not present on admission Catheters and tubes: None  Data Review:    Labs on Admission: I have personally reviewed following labs and imaging studies  CBC: Recent Labs  Lab 11/30/19 1236  WBC 5.6  NEUTROABS 3.8  HGB 8.8*  HCT 28.4*  MCV 87.4  PLT 638   Basic Metabolic Panel: Recent Labs  Lab 11/30/19 1236 12/01/19 0134  NA 139 138  K 4.8 4.2  CL 109 108  CO2 20* 19*  GLUCOSE 175* 190*  BUN 74* 77*  CREATININE 7.46* 7.71*  CALCIUM 8.3* 7.8*   GFR: Estimated Creatinine Clearance: 13.9 mL/min (A) (by C-G formula based on SCr of 7.71 mg/dL (H)). Liver Function Tests: No results for input(s): AST, ALT, ALKPHOS, BILITOT, PROT, ALBUMIN in the last 168 hours. No results for input(s): LIPASE, AMYLASE in the last 168 hours. No results for input(s): AMMONIA in the last 168 hours. Coagulation Profile: No results for input(s): INR, PROTIME in the last 168 hours. Cardiac Enzymes: No results for input(s): CKTOTAL, CKMB, CKMBINDEX, TROPONINI in the last 168 hours. BNP (last 3 results) No results for input(s): PROBNP in the last 8760 hours. HbA1C: Recent Labs    11/30/19 1236  HGBA1C 8.8*   CBG: No results for input(s): GLUCAP in the last 168 hours. Lipid Profile: Recent Labs    11/30/19 1236  CHOL 138  HDL 63  LDLCALC 67  TRIG 42  CHOLHDL 2.2   Thyroid Function Tests: No results for input(s): TSH, T4TOTAL, FREET4, T3FREE, THYROIDAB in the last 72 hours. Anemia Panel: No results for input(s): VITAMINB12, FOLATE, FERRITIN, TIBC, IRON, RETICCTPCT in the last 72 hours.  Urine analysis:     Component Value Date/Time   COLORURINE YELLOW 08/16/2018 1605   APPEARANCEUR HAZY (A) 08/16/2018 1605   LABSPEC 1.016 08/16/2018 1605   PHURINE 5.0 08/16/2018 1605   GLUCOSEU >=500 (A) 08/16/2018 1605   HGBUR LARGE (A) 08/16/2018 1605   BILIRUBINUR NEGATIVE 08/16/2018 1605   KETONESUR 20 (A) 08/16/2018 1605   PROTEINUR Positive (A) 02/16/2019 1453   PROTEINUR 100 (A) 08/16/2018 1605   NITRITE NEGATIVE 08/16/2018 1605   LEUKOCYTESUR NEGATIVE 08/16/2018 1605     Imaging Results:      Radiological Exams on Admission: DG Chest 2 View  Result Date: 12/01/2019 CLINICAL DATA:  Shortness of breath EXAM: CHEST - 2 VIEW COMPARISON:  CT 11/24/2018 FINDINGS: Heart and mediastinal contours are within normal limits. No focal opacities or effusions. No acute bony abnormality. IMPRESSION: No active cardiopulmonary disease. Electronically Signed   By: Rolm Baptise M.D.   On: 12/01/2019 01:38      Adan Baehr Ginette Otto MD [facility]  If 7PM-7AM, please contact night-coverage   12/01/2019, 2:40 AM

## 2019-12-01 NOTE — ED Provider Notes (Addendum)
[facility] EMERGENCY DEPARTMENT Provider Note   CSN: [id number] Arrival date & time: 12/01/19  0031   Time seen 1:04 AM  History Chief Complaint  Patient presents with   Abnormal Lab    Randy Oneal is a 54 y.o. male.  HPI Patient states he has noticed his leg swelling over the past week and feeling heavy.  He also states he has some dyspnea on exertion however he only notices it if he is walking on an incline.  He denies chest pain, fever, cough, nausea, or vomiting.  He states he has hypertension and recently it has been very hard to control.  During my exam his blood pressure was 200/102.  He was seen today by his primary care doctor and he was prescribed fluid pills that will be ready tomorrow.  He had lab work done and he was called at [facility] and told to come to the ED for possible admission for dialysis.  He states he has been followed by [facility] by Dr. [name] however he lost his insurance in February and could not follow through to get a fistula placed for possible dialysis in the future.  He states that last January he had a abscess on his left clavicle which required resection and a wound VAC.  After that he had surgery on both his eyes for diabetic retinopathy and he was unable to return to work.  He states he has had diabetes since 2000.  PCP [name], MD Nephrology [facility] Dr. [name]    Past Medical History:  Diagnosis Date   Diabetes mellitus    Type 2    Diabetic retinopathy of both eyes ([place]) 01/31/2019   Dr Coralyn Pear 01/2019   Herpes simplex virus (HSV) infection    OU   Hypertension    Renal insufficiency    ckd stage 4   Retinal detachment    OS   Sepsis due to Streptococcus, group B ([place]) 10/05/2018    Patient Active Problem List   Diagnosis Date Noted   Skin callus 08/03/2019   Pain due to onychomycosis of toenails of both feet 04/20/2019   Posterior tibial tendon dysfunction (PTTD) of both lower extremities  04/20/2019   Diabetic neuropathy ([place]) 04/20/2019   Diabetic retinopathy of both eyes ([place]) 01/31/2019   Type 2 diabetes mellitus with hyperglycemia, with long-term current use of insulin ([place]) 10/20/2018   Sepsis due to Streptococcus, group B ([place]) 10/05/2018   Dietary counseling and surveillance 09/15/2018   Non compliance with medical treatment 09/15/2018   Open wound of right great toe    Septic arthritis of left sternoclavicular joint ([place])    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   Cellulitis of great toe, right    Diabetic hyperosmolar non-ketotic state ([place]) 08/17/2018   Hyperglycemia 08/17/2018   AKI (acute kidney injury) ([place]) 08/16/2018   Hyponatremia 08/16/2018   Hyperlipidemia associated with type 2 diabetes mellitus ([place]) 03/04/2017   Erectile dysfunction 03/04/2017   Personal history of noncompliance with medical treatment, presenting hazards to health 11/22/2015   HTN (hypertension), benign 01/30/2014   Loss of weight 01/30/2014   Type 2 diabetes mellitus not at goal [facility]) 03/30/2013    Past Surgical History:  Procedure Laterality Date   25 GAUGE PARS PLANA VITRECTOMY WITH 20 GAUGE MVR PORT FOR MACULAR HOLE Right 02/24/2019   Procedure: 25 GAUGE PARS PLANA VITRECTOMY WITH 20 GAUGE MVR PORT FOR MACULAR HOLE;  Surgeon: Bernarda Caffey, MD;  Location: [place];  Service: Ophthalmology;  Laterality: Right;   APPLICATION OF WOUND VAC Left 08/27/2018   Procedure: APPLICATION OF WOUND VAC, left neck;  Surgeon: Gaye Pollack, MD;  Location: [place];  Service: Thoracic;  Laterality: Left;   APPLICATION OF WOUND VAC Left 08/30/2018   Procedure: APPLICATION OF WOUND VAC;  Surgeon: Gaye Pollack, MD;  Location: [place];  Service: Thoracic;  Laterality: Left;   CATARACT EXTRACTION Bilateral    CATARACT EXTRACTION W/ INTRAOCULAR LENS  IMPLANT, BILATERAL     COLONOSCOPY  06/24/2011   Procedure: COLONOSCOPY;  Surgeon: [name], MD;  [facility];   Service: Endoscopy;  Laterality: N/A;  8:30 AM   EYE SURGERY     GAS/FLUID EXCHANGE Left 03/31/2019   Procedure: Gas/Fluid Exchange;  Surgeon: Bernarda Caffey, MD;  Location: [place];  Service: Ophthalmology;  Laterality: Left;   INJECTION OF SILICONE OIL  2/95/2841   Procedure: Injection Of Silicone Oil;  Surgeon: Bernarda Caffey, MD;  Location: [place];  Service: Ophthalmology;;   MEMBRANE PEEL Right 02/24/2019   Procedure: Antoine Primas;  Surgeon: Bernarda Caffey, MD;  Location: [place];  Service: Ophthalmology;  Laterality: Right;   PARS PLANA VITRECTOMY Left 03/31/2019   Procedure: PARS PLANA VITRECTOMY WITH 25 GAUGE WITH MEMBRANE PEEL;  Surgeon: Bernarda Caffey, MD;  Location: [place];  Service: Ophthalmology;  Laterality: Left;   PHOTOCOAGULATION WITH LASER Right 02/24/2019   Procedure: Photocoagulation With Laser;  Surgeon: Bernarda Caffey, MD;  Location: [place];  Service: Ophthalmology;  Laterality: Right;   PHOTOCOAGULATION WITH LASER Left 03/31/2019   Procedure: Photocoagulation With Laser;  Surgeon: Bernarda Caffey, MD;  Location: [place];  Service: Ophthalmology;  Laterality: Left;   RETINAL DETACHMENT SURGERY Left 03/31/2019   TRD Repair - Dr. Bernarda Caffey   SILICON OIL REMOVAL Right 11/01/4008   Procedure: Silicon Oil Removal;  Surgeon: Bernarda Caffey, MD;  Location: [place];  Service: Ophthalmology;  Laterality: Right;   STERNAL WOUND DEBRIDEMENT Left 08/27/2018   Procedure: Incision and DEBRIDEMENT Left Chest, Neck and Mediastinum;  Surgeon: Gaye Pollack, MD;  [facility];  Service: Thoracic;  Laterality: Left;   STERNAL WOUND DEBRIDEMENT Left 08/30/2018   Procedure: WOUND VAC CHANGE, LEFT CHEST AND NECK, POSSIBLE DEBRIDEMENT;  Surgeon: Gaye Pollack, MD;  Location: [facility];  Service: Thoracic;  Laterality: Left;       Family History  Problem Relation Age of Onset   Hypertension Mother    Colon cancer Neg Hx     Social History   Tobacco Use   Smoking status: Never Smoker    Smokeless tobacco: Never Used  Substance Use Topics   Alcohol use: No   Drug use: No  Applying for disability  Home Medications Prior to Admission medications   Medication Sig Start Date End Date Taking? Authorizing Provider  acetaminophen (TYLENOL) 500 MG tablet Take 1,000 mg by mouth every 6 (six) hours as needed for moderate pain or headache.    [provider]  amLODipine (NORVASC) 10 MG tablet TAKE (1) TABLET BY MOUTH ONCE DAILY. 11/01/19   [name], MD  atorvastatin (LIPITOR) 10 MG tablet TAKE ONE TABLET BY MOUTH ONCE DAILY. 11/01/19   [name], MD  bacitracin-polymyxin b (POLYSPORIN) ophthalmic ointment Place 1 application into both eyes at bedtime. Patient taking differently: Place 1 application into both eyes 4 (four) times daily.  04/22/19   Bernarda Caffey, MD  brimonidine (ALPHAGAN) 0.2 % ophthalmic solution Place 1 drop  into both eyes daily. 06/13/19   Bernarda Caffey, MD  Bromfenac Sodium (PROLENSA) 0.07 % SOLN Place 1 drop into both eyes 4 (four) times daily. 06/13/19   Bernarda Caffey, MD  calcitRIOL (ROCALTROL) 0.25 MCG capsule Take 0.25 mcg by mouth 3 (three) times a week. 08/13/19   [provider]  Continuous Blood Gluc Sensor (FREESTYLE LIBRE SENSOR SYSTEM) MISC Use one sensor every 10 days. 08/10/17   [name], MD  famotidine (PEPCID) 40 MG tablet Take one tablet by mouth daily Patient taking differently: Take 40 mg by mouth daily as needed for heartburn.  02/16/19   [name], MD  furosemide (LASIX) 40 MG tablet One to 2 tablets qam 11/30/19   Luking, [name], MD  gatifloxacin (ZYMAXID) 0.5 % SOLN Place 1 drop into both eyes 4 (four) times daily.    [provider]  glucose blood (BAYER CONTOUR NEXT TEST) test strip USE TO TEST FOUR TIMES DAILY. 07/28/17   [name], MD  hydrALAZINE (APRESOLINE) 50 MG tablet Take 50 mg by mouth 3 (three) times daily. 07/22/19   [provider]  insulin lispro (HUMALOG  KWIKPEN) 100 UNIT/ML KwikPen Inject 0.06 mLs (6 Units total) into the skin daily with breakfast AND 0.07 mLs (7 Units total) 2 (two) times daily before lunch and supper. Patient taking differently: inject 5-9 unit with each meal 11/17/18   Shamleffer, Melanie Crazier, MD  Insulin Pen Needle (BD PEN NEEDLE NANO 2ND GEN) 32G X 4 MM MISC Four times daily 10/20/18   Shamleffer, Melanie Crazier, MD  LOKELMA 10 g PACK packet SMARTSIG:1 Packet(s) By Mouth 3 Times a Week 07/19/19   [provider]  metoprolol tartrate (LOPRESSOR) 50 MG tablet Take one half tablet by mouth twice daily Patient taking differently: Take 25 mg by mouth 2 (two) times daily.  09/28/18   [name], MD  MICROLET LANCETS MISC USE FOUR TIMES A DAY AS DIRECTED. 11/19/16   [name], MD  nystatin (MYCOSTATIN/NYSTOP) powder Apply topically 2 (two) times daily. To groin and scrotum Patient taking differently: Apply 1 g topically 2 (two) times daily as needed (rash). To groin and scrotum 09/04/18   Barrett, Erin R, PA-C  pantoprazole (PROTONIX) 40 MG tablet Take 1 tablet (40 mg total) by mouth daily. 10/14/18 10/14/19  [name], MD  prednisoLONE acetate (PRED FORTE) 1 % ophthalmic suspension Place 1 drop into both eyes 4 (four) times daily. 07/15/19   Bernarda Caffey, MD  sildenafil (REVATIO) 20 MG tablet May take up to 5 before relations as directed 03/04/17   [name], MD  Simethicone (GAS-X PO) Take 1 tablet by mouth daily as needed (gas).    [provider]  sodium bicarbonate 650 MG tablet Take 650 mg by mouth 2 (two) times daily. 07/18/19   [provider]  TRESIBA FLEXTOUCH 100 UNIT/ML SOPN FlexTouch Pen Inject 0.19 mLs (19 Units total) into the skin at bedtime. Patient taking differently: Inject 22 Units into the skin at bedtime.  11/17/18   Shamleffer, Melanie Crazier, MD  Vitamin D, Ergocalciferol, (DRISDOL) 1.25 MG (50000 UT) CAPS capsule Take 1.25 Units by mouth once a week. 05/16/19    [provider]    Allergies    Tramadol  Review of Systems   Review of Systems  All other systems reviewed and are negative.   Physical Exam Updated Vital Signs BP (!) 177/91    Pulse 88    Temp 98  F (36.7 C) (Oral)    Resp 14    Ht 6' (1.829 m)    Wt 105.7 kg    SpO2 95%    BMI 31.60 kg/m   Physical Exam Vitals and nursing note reviewed.  Constitutional:      Appearance: Normal appearance. He is normal weight.     Comments: pleasant  HENT:     Head: Normocephalic and atraumatic.     Nose: Nose normal.  Eyes:     Extraocular Movements: Extraocular movements intact.     Conjunctiva/sclera: Conjunctivae normal.     Pupils: Pupils are equal, round, and reactive to light.  Cardiovascular:     Rate and Rhythm: Normal rate and regular rhythm.     Heart sounds: Normal heart sounds. No murmur.  Pulmonary:     Effort: Pulmonary effort is normal. No respiratory distress.     Breath sounds: Normal breath sounds. No stridor. No wheezing, rhonchi or rales.  Musculoskeletal:     Cervical back: Normal range of motion.     Right lower leg: Edema present.     Left lower leg: Edema present.     Comments: Patient's legs are tight with pitting edema up to his knees and trace pitting edema above the knees  Skin:    General: Skin is warm and dry.  Neurological:     General: No focal deficit present.     Mental Status: He is alert and oriented to person, place, and time.     Cranial Nerves: No cranial nerve deficit.  Psychiatric:        Mood and Affect: Mood normal.        Behavior: Behavior normal.        Thought Content: Thought content normal.     ED Results / Procedures / Treatments   Labs (all labs ordered are listed, but only abnormal results are displayed)  Results for orders placed or performed during the hospital encounter of 18/56/31  Basic metabolic panel  Result Value Ref Range   Sodium 138 135 - 145 mmol/L   Potassium 4.2 3.5 - 5.1 mmol/L   Chloride 108  98 - 111 mmol/L   CO2 19 (L) 22 - 32 mmol/L   Glucose, Bld 190 (H) 70 - 99 mg/dL   BUN 77 (H) 6 - 20 mg/dL   Creatinine, Ser 7.71 (H) 0.61 - 1.24 mg/dL   Calcium 7.8 (L) 8.9 - 10.3 mg/dL   GFR calc non Af Amer 7 (L) >60 mL/min   GFR calc Af Amer 8 (L) >60 mL/min   Anion gap 11 5 - 15   Laboratory interpretation all normal including normal potassium, worsening renal failure since August.  Results for orders placed or performed during the hospital encounter of 11/30/19  CBC with Differential/Platelet  Result Value Ref Range   WBC 5.6 4.0 - 10.5 K/uL   RBC 3.25 (L) 4.22 - 5.81 MIL/uL   Hemoglobin 8.8 (L) 13.0 - 17.0 g/dL   HCT 28.4 (L) 39.0 - 52.0 %   MCV 87.4 80.0 - 100.0 fL   MCH 27.1 26.0 - 34.0 pg   MCHC 31.0 30.0 - 36.0 g/dL   RDW 13.2 11.5 - 15.5 %   Platelets 178 150 - 400 K/uL   nRBC 0.0 0.0 - 0.2 %   Neutrophils Relative % 66 %   Neutro Abs 3.8 1.7 - 7.7 K/uL   Lymphocytes Relative 20 %   Lymphs Abs 1.1 0.7 - 4.0 K/uL  Monocytes Relative 10 %   Monocytes Absolute 0.6 0.1 - 1.0 K/uL   Eosinophils Relative 3 %   Eosinophils Absolute 0.2 0.0 - 0.5 K/uL   Basophils Relative 0 %   Basophils Absolute 0.0 0.0 - 0.1 K/uL   Immature Granulocytes 1 %   Abs Immature Granulocytes 0.03 0.00 - 0.07 K/uL  Basic metabolic panel  Result Value Ref Range   Sodium 139 135 - 145 mmol/L   Potassium 4.8 3.5 - 5.1 mmol/L   Chloride 109 98 - 111 mmol/L   CO2 20 (L) 22 - 32 mmol/L   Glucose, Bld 175 (H) 70 - 99 mg/dL   BUN 74 (H) 6 - 20 mg/dL   Creatinine, Ser 7.46 (H) 0.61 - 1.24 mg/dL   Calcium 8.3 (L) 8.9 - 10.3 mg/dL   GFR calc non Af Amer 8 (L) >60 mL/min   GFR calc Af Amer 9 (L) >60 mL/min   Anion gap 10 5 - 15  Brain natriuretic peptide  Result Value Ref Range   B Natriuretic Peptide 212.0 (H) 0.0 - 100.0 pg/mL  Hemoglobin A1c  Result Value Ref Range   Hgb A1c MFr Bld 8.8 (H) 4.8 - 5.6 %   Mean Plasma Glucose 205.86 mg/dL  Lipid panel  Result Value Ref Range    Cholesterol 138 0 - 200 mg/dL   Triglycerides 42 <150 mg/dL   HDL 63 >40 mg/dL   Total CHOL/HDL Ratio 2.2 RATIO   VLDL 8 0 - 40 mg/dL   LDL Cholesterol 67 0 - 99 mg/dL      EKG EKG Interpretation  Date/Time:  Thursday December 01 2019 01:11:34 EDT Ventricular Rate:  86 PR Interval:    QRS Duration: 96 QT Interval:  384 QTC Calculation: 460 R Axis:   78 Text Interpretation: Sinus rhythm Probable left atrial enlargement Peaked T waves, also present on last EKG 15 Feb 2019 No significant change since last tracing 15 Feb 2019 Confirmed by Rolland Porter [id number]) on 12/01/2019 1:35:05 AM   Radiology DG Chest 2 View  Result Date: 12/01/2019 CLINICAL DATA:  Shortness of breath EXAM: CHEST - 2 VIEW COMPARISON:  CT 11/24/2018 FINDINGS: Heart and mediastinal contours are within normal limits. No focal opacities or effusions. No acute bony abnormality. IMPRESSION: No active cardiopulmonary disease. Electronically Signed   By: Rolm Baptise M.D.   On: 12/01/2019 01:38    Procedures .Critical Care Performed by: Rolland Porter, MD Authorized by: Rolland Porter, MD   Critical care provider statement:    Critical care time (minutes):  31   Critical care was necessary to treat or prevent imminent or life-threatening deterioration of the following conditions:  Renal failure   Critical care was time spent personally by me on the following activities:  Discussions with consultants, examination of patient, obtaining history from patient or surrogate, ordering and review of laboratory studies, ordering and review of radiographic studies, pulse oximetry, re-evaluation of patient's condition and review of old charts   (including critical care time)  Medications Ordered in ED Medications  furosemide (LASIX) injection 80 mg (has no administration in time range)  labetalol (NORMODYNE) injection 10 mg (10 mg Intravenous Given 12/01/19 0149)    ED Course  I have reviewed the triage vital signs and the nursing  notes.  Pertinent labs & imaging results that were available during my care of the patient were reviewed by me and considered in my medical decision making (see chart for details).    MDM Rules/Calculators/A&P  Patient just had blood work done earlier today.  The only thing I repeated was a be met to make sure his potassium had not gotten high.  An EKG and chest x-ray were done to assess whether he he had any acute EKG changes or if he had any evidence of failure on his chest x-ray.  His hypertension was treated with IV labetalol.  Patient's blood pressure improved to 177/91 and his heart rate was 88 after the labetalol.  Reviewing patient's labs from today shows his kidney function has worsened, in August 2020 his creatinine was 3.5 and now it is almost 7.5.  He also has a anemia that has been fluctuating over the past year but has been as low as 7 and as high as 10.7.  Patient's repeat bmet shows his potassium is still normal.  I will discuss with nephrology.  2:27 AM Dr. [name], [facility], states she thinks patient can be admitted here and they will consult here.  She states he does need dialysis but not emergently during the night.  She also suggest giving him Lasix 80 mg IV.  2:40 AM Dr. Scherrie November, hospitalist will admit.  3:02 AM Dr. [name] called back and asked to make the patient n.p.o. so he can have access in the morning.  Final Clinical Impression(s) / ED Diagnoses Final diagnoses:  ESRD needing dialysis [facility])  Essential hypertension    Rx / DC Orders  Plan admission  Rolland Porter, MD, Barbette Or, MD 12/01/19 [place], [place], MD 12/01/19 [phone]

## 2019-12-01 NOTE — Consult Note (Signed)
[facility] Consult  Reason for Consult: Dialysis access  Referring Physician: Dr. Denton Brick, Dr. Hollie Salk   Chief Complaint    Abnormal Lab      HPI: Randy Oneal is a 54 y.o. male with worsening renal failure who needs to initiate dialysis. He has a history of HTN and Diabetes. He was going to get a fistula but never was able to do this due to losing his insurance. He has never had to get a catheter prior. He has a history of an infection over his left clavicular head and multiple debridements/ surgeries/ wound vacuum to this area. It is suspected he had bacteremia and then developed this abscess per his reported. He came to the hospital this admission with lower extremity swelling over the past week. He had been by his nephrologist and due to worsening labs, he was sent to the hospital for further management.   Currently he says he is doing well and has been using the restroom a lot.   Past Medical History:  Diagnosis Date  . Diabetes mellitus    Type 2   . Diabetic retinopathy of both eyes ([place]) 01/31/2019   Dr Coralyn Pear 01/2019  . Herpes simplex virus (HSV) infection    OU  . Hypertension   . Renal insufficiency    ckd stage 4  . Retinal detachment    OS  . Sepsis due to Streptococcus, group B ([place]) 10/05/2018    Past Surgical History:  Procedure Laterality Date  . [place] VITRECTOMY WITH 20 GAUGE MVR PORT FOR MACULAR HOLE Right 02/24/2019   Procedure: 25 GAUGE PARS PLANA VITRECTOMY WITH 20 GAUGE MVR PORT FOR MACULAR HOLE;  Surgeon: Bernarda Caffey, MD;  Location: [place];  Service: Ophthalmology;  Laterality: Right;  . APPLICATION OF WOUND VAC Left 08/27/2018   Procedure: APPLICATION OF WOUND VAC, left neck;  Surgeon: Gaye Pollack, MD;  Location: [place];  Service: Thoracic;  Laterality: Left;  . APPLICATION OF WOUND VAC Left 08/30/2018   Procedure: APPLICATION OF WOUND VAC;  Surgeon: Gaye Pollack, MD;  Location: [place];  Service: Thoracic;  Laterality:  Left;  . CATARACT EXTRACTION Bilateral   . CATARACT EXTRACTION W/ INTRAOCULAR LENS  IMPLANT, BILATERAL    . COLONOSCOPY  06/24/2011   Procedure: COLONOSCOPY;  Surgeon: [name], MD;  [facility];  Service: Endoscopy;  Laterality: N/A;  8:30 AM  . EYE SURGERY    . GAS/FLUID EXCHANGE Left 03/31/2019   Procedure: Gas/Fluid Exchange;  Surgeon: Bernarda Caffey, MD;  Location: [place];  Service: Ophthalmology;  Laterality: Left;  . INJECTION OF SILICONE OIL  11/18/7351   Procedure: Injection Of Silicone Oil;  Surgeon: Bernarda Caffey, MD;  Location: [place];  Service: Ophthalmology;;  . MEMBRANE PEEL Right 02/24/2019   Procedure: Antoine Primas;  Surgeon: Bernarda Caffey, MD;  Location: [place];  Service: Ophthalmology;  Laterality: Right;  . PARS PLANA VITRECTOMY Left 03/31/2019   Procedure: PARS PLANA VITRECTOMY WITH 25 GAUGE WITH MEMBRANE PEEL;  Surgeon: Bernarda Caffey, MD;  Location: [place];  Service: Ophthalmology;  Laterality: Left;  . PHOTOCOAGULATION WITH LASER Right 02/24/2019   Procedure: Photocoagulation With Laser;  Surgeon: Bernarda Caffey, MD;  Location: [place];  Service: Ophthalmology;  Laterality: Right;  . PHOTOCOAGULATION WITH LASER Left 03/31/2019   Procedure: Photocoagulation With Laser;  Surgeon: Bernarda Caffey, MD;  Location: [place];  Service: Ophthalmology;  Laterality: Left;  . RETINAL DETACHMENT SURGERY Left 03/31/2019  TRD Repair - Dr. Bernarda Caffey  . SILICON OIL REMOVAL Right 4/74/2595   Procedure: Silicon Oil Removal;  Surgeon: Bernarda Caffey, MD;  Location: [place];  Service: Ophthalmology;  Laterality: Right;  . STERNAL WOUND DEBRIDEMENT Left 08/27/2018   Procedure: Incision and DEBRIDEMENT Left Chest, Neck and Mediastinum;  Surgeon: Gaye Pollack, MD;  Location: [facility];  Service: Thoracic;  Laterality: Left;  . STERNAL WOUND DEBRIDEMENT Left 08/30/2018   Procedure: WOUND VAC CHANGE, LEFT CHEST AND NECK, POSSIBLE DEBRIDEMENT;  Surgeon: Gaye Pollack, MD;  Location: [facility];   Service: Thoracic;  Laterality: Left;    Family History  Problem Relation Age of Onset  . Hypertension Mother   . Colon cancer Neg Hx     Social History   Tobacco Use  . Smoking status: Never Smoker  . Smokeless tobacco: Never Used  Substance Use Topics  . Alcohol use: No  . Drug use: No    Medications:  I have reviewed the patient's current medications. Prior to Admission:  Medications Prior to Admission  Medication Sig Dispense Refill Last Dose  . acetaminophen (TYLENOL) 500 MG tablet Take 1,000 mg by mouth every 6 (six) hours as needed for moderate pain or headache.     Marland Kitchen amLODipine (NORVASC) 10 MG tablet TAKE (1) TABLET BY MOUTH ONCE DAILY. 30 tablet 0   . atorvastatin (LIPITOR) 10 MG tablet TAKE ONE TABLET BY MOUTH ONCE DAILY. 30 tablet 0   . bacitracin-polymyxin b (POLYSPORIN) ophthalmic ointment Place 1 application into both eyes at bedtime. (Patient taking differently: Place 1 application into both eyes 4 (four) times daily. ) 3.5 g 3   . brimonidine (ALPHAGAN) 0.2 % ophthalmic solution Place 1 drop into both eyes daily. 10 mL 10   . Bromfenac Sodium (PROLENSA) 0.07 % SOLN Place 1 drop into both eyes 4 (four) times daily. 3 mL 10   . calcitRIOL (ROCALTROL) 0.25 MCG capsule Take 0.25 mcg by mouth 3 (three) times a week.     . Continuous Blood Gluc Sensor (FREESTYLE LIBRE SENSOR SYSTEM) MISC Use one sensor every 10 days. 3 each 2   . famotidine (PEPCID) 40 MG tablet Take one tablet by mouth daily (Patient taking differently: Take 40 mg by mouth daily as needed for heartburn. ) 30 tablet 5   . furosemide (LASIX) 40 MG tablet One to 2 tablets qam 60 tablet 5   . gatifloxacin (ZYMAXID) 0.5 % SOLN Place 1 drop into both eyes 4 (four) times daily.     Marland Kitchen glucose blood (BAYER CONTOUR NEXT TEST) test strip USE TO TEST FOUR TIMES DAILY. 150 each 2   . hydrALAZINE (APRESOLINE) 50 MG tablet Take 50 mg by mouth 3 (three) times daily.     . insulin lispro (HUMALOG KWIKPEN) 100 UNIT/ML  KwikPen Inject 0.06 mLs (6 Units total) into the skin daily with breakfast AND 0.07 mLs (7 Units total) 2 (two) times daily before lunch and supper. (Patient taking differently: inject 5-9 unit with each meal) 15 mL 11   . Insulin Pen Needle (BD PEN NEEDLE NANO 2ND GEN) 32G X 4 MM MISC Four times daily 150 each 11   . LOKELMA 10 g PACK packet SMARTSIG:1 Packet(s) By Mouth 3 Times a Week     . metoprolol tartrate (LOPRESSOR) 50 MG tablet Take one half tablet by mouth twice daily (Patient taking differently: Take 25 mg by mouth 2 (two) times daily. ) 60 tablet 3   . MICROLET LANCETS MISC  USE FOUR TIMES A DAY AS DIRECTED. 100 each 0   . nystatin (MYCOSTATIN/NYSTOP) powder Apply topically 2 (two) times daily. To groin and scrotum (Patient taking differently: Apply 1 g topically 2 (two) times daily as needed (rash). To groin and scrotum) 15 g 0   . pantoprazole (PROTONIX) 40 MG tablet Take 1 tablet (40 mg total) by mouth daily. 30 tablet 4   . prednisoLONE acetate (PRED FORTE) 1 % ophthalmic suspension Place 1 drop into both eyes 4 (four) times daily. 15 mL 3   . sildenafil (REVATIO) 20 MG tablet May take up to 5 before relations as directed 50 tablet 4   . Simethicone (GAS-X PO) Take 1 tablet by mouth daily as needed (gas).     . sodium bicarbonate 650 MG tablet Take 650 mg by mouth 2 (two) times daily.     . TRESIBA FLEXTOUCH 100 UNIT/ML SOPN FlexTouch Pen Inject 0.19 mLs (19 Units total) into the skin at bedtime. (Patient taking differently: Inject 22 Units into the skin at bedtime. ) 15 mL 1   . Vitamin D, Ergocalciferol, (DRISDOL) 1.25 MG (50000 UT) CAPS capsule Take 1.25 Units by mouth once a week.      Scheduled: . amLODipine  10 mg Oral Daily  . atorvastatin  10 mg Oral Daily  . [START ON 12/02/2019] calcitRIOL  0.25 mcg Oral Once per day on Mon Wed Fri  . furosemide  80 mg Intravenous Q6H  . heparin  5,000 Units Subcutaneous Q8H  . hydrALAZINE  50 mg Oral TID  . insulin aspart  0-15 Units  Subcutaneous TID WC  . insulin aspart  0-5 Units Subcutaneous QHS  . insulin glargine  22 Units Subcutaneous QHS  . metoprolol tartrate  25 mg Oral BID  . pantoprazole  40 mg Oral Daily  . sodium bicarbonate  650 mg Oral BID  . sodium chloride flush  3 mL Intravenous Q12H   Continuous: . sodium chloride     NKN:LZJQBH chloride, bisacodyl, famotidine, ondansetron **OR** ondansetron (ZOFRAN) IV, polyethylene glycol, simethicone, sodium chloride flush  Allergies  Allergen Reactions  . Tramadol Nausea And Vomiting     ROS:  A comprehensive review of systems was negative except for: Respiratory: positive for shortness of breath Musculoskeletal: positive for lower extremity swelling  Blood pressure (!) 156/82, pulse 90, temperature 98.2 F (36.8 C), temperature source Oral, resp. rate 20, height 6' (1.829 m), weight 106.2 kg, SpO2 99 %. Physical Exam Vitals reviewed.  Constitutional:      Appearance: He is normal weight.  HENT:     Head: Normocephalic and atraumatic.     Nose: Nose normal.     Mouth/Throat:     Mouth: Mucous membranes are moist.  Eyes:     Extraocular Movements: Extraocular movements intact.  Cardiovascular:     Rate and Rhythm: Normal rate.  Pulmonary:     Effort: Pulmonary effort is normal.  Abdominal:     General: There is no distension.     Palpations: Abdomen is soft.     Tenderness: There is no abdominal tenderness.  Musculoskeletal:        General: Swelling present.     Cervical back: Normal range of motion.  Neurological:     General: No focal deficit present.     Mental Status: He is alert and oriented to person, place, and time.  Psychiatric:        Mood and Affect: Mood normal.  Behavior: Behavior normal.        Thought Content: Thought content normal.        Judgment: Judgment normal.     Results: Results for orders placed or performed during the hospital encounter of 12/01/19 (from the past 48 hour(s))  Basic metabolic panel      Status: Abnormal   Collection Time: 12/01/19  1:34 AM  Result Value Ref Range   Sodium 138 135 - 145 mmol/L   Potassium 4.2 3.5 - 5.1 mmol/L   Chloride 108 98 - 111 mmol/L   CO2 19 (L) 22 - 32 mmol/L   Glucose, Bld 190 (H) 70 - 99 mg/dL    Comment: Glucose reference range applies only to samples taken after fasting for at least 8 hours.   BUN 77 (H) 6 - 20 mg/dL   Creatinine, Ser 7.71 (H) 0.61 - 1.24 mg/dL   Calcium 7.8 (L) 8.9 - 10.3 mg/dL   GFR calc non Af Amer 7 (L) >60 mL/min   GFR calc Af Amer 8 (L) >60 mL/min   Anion gap 11 5 - 15    Comment: Performed at [facility], [address], [place], [place] [address]  Respiratory Panel by RT PCR (Flu A&B, Covid) - Nasopharyngeal Swab     Status: None   Collection Time: 12/01/19  2:31 AM   Specimen: Nasopharyngeal Swab  Result Value Ref Range   SARS Coronavirus 2 by RT PCR NEGATIVE NEGATIVE    Comment: (NOTE) SARS-CoV-2 target nucleic acids are NOT DETECTED. The SARS-CoV-2 RNA is generally detectable in upper respiratoy specimens during the acute phase of infection. The lowest concentration of SARS-CoV-2 viral copies this assay can detect is 131 copies/mL. A negative result does not preclude SARS-Cov-2 infection and should not be used as the sole basis for treatment or other patient management decisions. A negative result may occur with  improper specimen collection/handling, submission of specimen other than nasopharyngeal swab, presence of viral mutation(s) within the areas targeted by this assay, and inadequate number of viral copies (<131 copies/mL). A negative result must be combined with clinical observations, patient history, and epidemiological information. The expected result is Negative. Fact Sheet for Patients:  PinkCheek.be Fact Sheet for Healthcare Providers:  GravelBags.it This test is not yet ap proved or cleared by the [place] FDA and  has been  authorized for detection and/or diagnosis of SARS-CoV-2 by FDA under an Emergency Use Authorization (EUA). This EUA will remain  in effect (meaning this test can be used) for the duration of the COVID-19 declaration under Section 564(b)(1) of the Act, 21 U.S.C. section 360bbb-3(b)(1), unless the authorization is terminated or revoked sooner.    Influenza A by PCR NEGATIVE NEGATIVE   Influenza B by PCR NEGATIVE NEGATIVE    Comment: (NOTE) The Xpert Xpress SARS-CoV-2/FLU/RSV assay is intended as an aid in  the diagnosis of influenza from Nasopharyngeal swab specimens and  should not be used as a sole basis for treatment. Nasal washings and  aspirates are unacceptable for Xpert Xpress SARS-CoV-2/FLU/RSV  testing. Fact Sheet for Patients: PinkCheek.be Fact Sheet for Healthcare Providers: GravelBags.it This test is not yet approved or cleared by the [place] FDA and  has been authorized for detection and/or diagnosis of SARS-CoV-2 by  FDA under an Emergency Use Authorization (EUA). This EUA will remain  in effect (meaning this test can be used) for the duration of the  Covid-19 declaration under Section 564(b)(1) of the Act, 21  U.S.C. section 360bbb-3(b)(1),  unless the authorization is  terminated or revoked. Performed at [facility], [address], [place], [place] [address]   Glucose, capillary     Status: Abnormal   Collection Time: 12/01/19  4:33 AM  Result Value Ref Range   Glucose-Capillary 158 (H) 70 - 99 mg/dL    Comment: Glucose reference range applies only to samples taken after fasting for at least 8 hours.  Basic metabolic panel     Status: Abnormal   Collection Time: 12/01/19  5:22 AM  Result Value Ref Range   Sodium 138 135 - 145 mmol/L   Potassium 4.0 3.5 - 5.1 mmol/L   Chloride 110 98 - 111 mmol/L   CO2 18 (L) 22 - 32 mmol/L   Glucose, Bld 186 (H) 70 - 99 mg/dL    Comment: Glucose reference range  applies only to samples taken after fasting for at least 8 hours.   BUN 79 (H) 6 - 20 mg/dL   Creatinine, Ser 8.05 (H) 0.61 - 1.24 mg/dL   Calcium 7.6 (L) 8.9 - 10.3 mg/dL   GFR calc non Af Amer 7 (L) >60 mL/min   GFR calc Af Amer 8 (L) >60 mL/min   Anion gap 10 5 - 15    Comment: Performed at [facility], [address] [place], [place] [address]  CBC     Status: Abnormal   Collection Time: 12/01/19  5:22 AM  Result Value Ref Range   WBC 5.6 4.0 - 10.5 K/uL   RBC 2.92 (L) 4.22 - 5.81 MIL/uL   Hemoglobin 7.8 (L) 13.0 - 17.0 g/dL   HCT 25.4 (L) 39.0 - 52.0 %   MCV 87.0 80.0 - 100.0 fL   MCH 26.7 26.0 - 34.0 pg   MCHC 30.7 30.0 - 36.0 g/dL   RDW 13.2 11.5 - 15.5 %   Platelets 155 150 - 400 K/uL   nRBC 0.0 0.0 - 0.2 %    Comment: Performed at [facility], [address], [place],  [address]  Glucose, capillary     Status: Abnormal   Collection Time: 12/01/19  8:42 AM  Result Value Ref Range   Glucose-Capillary 162 (H) 70 - 99 mg/dL    Comment: Glucose reference range applies only to samples taken after fasting for at least 8 hours.  Glucose, capillary     Status: Abnormal   Collection Time: 12/01/19 12:07 PM  Result Value Ref Range   Glucose-Capillary 166 (H) 70 - 99 mg/dL    Comment: Glucose reference range applies only to samples taken after fasting for at least 8 hours.    DG Chest 2 View  Result Date: 12/01/2019 CLINICAL DATA:  Shortness of breath EXAM: CHEST - 2 VIEW COMPARISON:  CT 11/24/2018 FINDINGS: Heart and mediastinal contours are within normal limits. No focal opacities or effusions. No acute bony abnormality. IMPRESSION: No active cardiopulmonary disease. Electronically Signed   By: Rolm Baptise M.D.   On: 12/01/2019 01:38     Assessment & Plan:  Randy Oneal is a 53 y.o. male with worsening renal failure. He will need a tunneled dialysis catheter. The soonest I can place at [facility] is Monday. I have put him on the schedule for then. If he needs  something sooner then will need to get a temporary catheter or go to IR at [facility] for placement.   -Discussed risk of bleeding, infection, malfunction, injury to vessels, use of fluorsoscopy, and risk of pneumothorax.  - EKG unchanged, CXR  without acute process - INR in AM  -COVID negative  - OR Monday for tunneled dialysis catheter  All questions were answered to the satisfaction of the patient and family. Discussed with Dr. Denton Brick who will further discuss with nephrology as needed prior to Monday.   Dr. Arnoldo Morale aware of patient and potential need for temporary catheter if things worsen.   Virl Cagey 12/01/2019, 12:43 PM

## 2019-12-01 NOTE — ED Triage Notes (Signed)
Pt went to dr Wolfgang Phoenix yesterday for leg swelling and feeling winded when ambulating. Dr Wolfgang Phoenix sent pt here due to abnormal kidney functions.

## 2019-12-01 NOTE — Consult Note (Signed)
Reason for Consult: AKI on CKD  Referring Physician: Dr. Fredonia Highland III is an 54 y.o. male.   HPI: Pt is a 13M with a PMH sig for CKD V, HTN, HLD, h/o L clavicle infection requiring woundvac, DM which has been poorly controlled who is now seen in consultation at the request of Dr. Denton Brick for eval and recs surrounding AKI on CKD.    Pt normally follows with Dr. [name], last seen Feb.  Discussions re: dialysis had, was referred for AVF but unable to get d/t losing insurance.  Comes in with increased swelling, SOB.  Saw PCP yesterday, gave lasix pills, and labs taken which showed Cr of 7.7.  In this setting he was asked to come to the ED.  He was given Lasix overnight, made about 1800 mL urine so far. Cr up to 8.0 this AM.    Denies f/c,n/v, SOB, CP.  Still has LE edema.  Eating breakfast, good appetite.  No asterixis.     Past Medical History:  Diagnosis Date  . Diabetes mellitus    Type 2   . Diabetic retinopathy of both eyes ([place]) 01/31/2019   Dr Coralyn Pear 01/2019  . Herpes simplex virus (HSV) infection    OU  . Hypertension   . Renal insufficiency    ckd stage 4  . Retinal detachment    OS  . Sepsis due to Streptococcus, group B ([place]) 10/05/2018    Past Surgical History:  Procedure Laterality Date  . [place] VITRECTOMY WITH 20 GAUGE MVR PORT FOR MACULAR HOLE Right 02/24/2019   Procedure: 25 GAUGE PARS PLANA VITRECTOMY WITH 20 GAUGE MVR PORT FOR MACULAR HOLE;  Surgeon: Bernarda Caffey, MD;  Location: [place];  Service: Ophthalmology;  Laterality: Right;  . APPLICATION OF WOUND VAC Left 08/27/2018   Procedure: APPLICATION OF WOUND VAC, left neck;  Surgeon: Gaye Pollack, MD;  Location: [facility];  Service: Thoracic;  Laterality: Left;  . APPLICATION OF WOUND VAC Left 08/30/2018   Procedure: APPLICATION OF WOUND VAC;  Surgeon: Gaye Pollack, MD;  Location: [place];  Service: Thoracic;  Laterality: Left;  . CATARACT EXTRACTION Bilateral   . CATARACT EXTRACTION W/  INTRAOCULAR LENS  IMPLANT, BILATERAL    . COLONOSCOPY  06/24/2011   Procedure: COLONOSCOPY;  Surgeon: [name], MD;  [facility];  Service: Endoscopy;  Laterality: N/A;  8:30 AM  . EYE SURGERY    . GAS/FLUID EXCHANGE Left 03/31/2019   Procedure: Gas/Fluid Exchange;  Surgeon: Bernarda Caffey, MD;  Location: [facility];  Service: Ophthalmology;  Laterality: Left;  . INJECTION OF SILICONE OIL  0/45/4098   Procedure: Injection Of Silicone Oil;  Surgeon: Bernarda Caffey, MD;  Location: [place] [facility];  Service: Ophthalmology;;  . MEMBRANE PEEL Right 02/24/2019   Procedure: Antoine Primas;  Surgeon: Bernarda Caffey, MD;  Location: [place];  Service: Ophthalmology;  Laterality: Right;  . PARS PLANA VITRECTOMY Left 03/31/2019   Procedure: PARS PLANA VITRECTOMY WITH 25 GAUGE WITH MEMBRANE PEEL;  Surgeon: Bernarda Caffey, MD;  Location: [place];  Service: Ophthalmology;  Laterality: Left;  . PHOTOCOAGULATION WITH LASER Right 02/24/2019   Procedure: Photocoagulation With Laser;  Surgeon: Bernarda Caffey, MD;  Location: [place];  Service: Ophthalmology;  Laterality: Right;  . PHOTOCOAGULATION WITH LASER Left 03/31/2019   Procedure: Photocoagulation With Laser;  Surgeon: Bernarda Caffey, MD;  Location: [place];  Service: Ophthalmology;  Laterality: Left;  . RETINAL DETACHMENT SURGERY Left 03/31/2019  TRD Repair - Dr. Bernarda Caffey  . SILICON OIL REMOVAL Right 02/07/5283   Procedure: Silicon Oil Removal;  Surgeon: Bernarda Caffey, MD;  Location: [facility];  Service: Ophthalmology;  Laterality: Right;  . STERNAL WOUND DEBRIDEMENT Left 08/27/2018   Procedure: Incision and DEBRIDEMENT Left Chest, Neck and Mediastinum;  Surgeon: Gaye Pollack, MD;  Location[facility];  Service: Thoracic;  Laterality: Left;  . STERNAL WOUND DEBRIDEMENT Left 08/30/2018   Procedure: WOUND VAC CHANGE, LEFT CHEST AND NECK, POSSIBLE DEBRIDEMENT;  Surgeon: Gaye Pollack, MD;  [facility];  Service: Thoracic;  Laterality: Left;    Family History  Problem  Relation Age of Onset  . Hypertension Mother   . Colon cancer Neg Hx     Social History:  reports that he has never smoked. He has never used smokeless tobacco. He reports that he does not drink alcohol or use drugs.  Allergies:  Allergies  Allergen Reactions  . Tramadol Nausea And Vomiting    Medications:  Scheduled: . amLODipine  10 mg Oral Daily  . atorvastatin  10 mg Oral Daily  . [START ON 12/02/2019] calcitRIOL  0.25 mcg Oral Once per day on Mon Wed Fri  . furosemide  80 mg Intravenous Q6H  . heparin  5,000 Units Subcutaneous Q8H  . hydrALAZINE  50 mg Oral TID  . insulin aspart  0-15 Units Subcutaneous TID WC  . insulin aspart  0-5 Units Subcutaneous QHS  . insulin glargine  22 Units Subcutaneous QHS  . metoprolol tartrate  25 mg Oral BID  . pantoprazole  40 mg Oral Daily  . sodium bicarbonate  650 mg Oral BID  . sodium chloride flush  3 mL Intravenous Q12H    Results for orders placed or performed during the hospital encounter of 12/01/19 (from the past 48 hour(s))  Basic metabolic panel     Status: Abnormal   Collection Time: 12/01/19  1:34 AM  Result Value Ref Range   Sodium 138 135 - 145 mmol/L   Potassium 4.2 3.5 - 5.1 mmol/L   Chloride 108 98 - 111 mmol/L   CO2 19 (L) 22 - 32 mmol/L   Glucose, Bld 190 (H) 70 - 99 mg/dL    Comment: Glucose reference range applies only to samples taken after fasting for at least 8 hours.   BUN 77 (H) 6 - 20 mg/dL   Creatinine, Ser 7.71 (H) 0.61 - 1.24 mg/dL   Calcium 7.8 (L) 8.9 - 10.3 mg/dL   GFR calc non Af Amer 7 (L) >60 mL/min   GFR calc Af Amer 8 (L) >60 mL/min   Anion gap 11 5 - 15    Comment: Performed at [facility], [address], [place], [place] [address]  Respiratory Panel by RT PCR (Flu A&B, Covid) - Nasopharyngeal Swab     Status: None   Collection Time: 12/01/19  2:31 AM   Specimen: Nasopharyngeal Swab  Result Value Ref Range   SARS Coronavirus 2 by RT PCR NEGATIVE NEGATIVE    Comment: (NOTE) SARS-CoV-2  target nucleic acids are NOT DETECTED. The SARS-CoV-2 RNA is generally detectable in upper respiratoy specimens during the acute phase of infection. The lowest concentration of SARS-CoV-2 viral copies this assay can detect is 131 copies/mL. A negative result does not preclude SARS-Cov-2 infection and should not be used as the sole basis for treatment or other patient management decisions. A negative result may occur with  improper specimen collection/handling, submission of specimen other than  nasopharyngeal swab, presence of viral mutation(s) within the areas targeted by this assay, and inadequate number of viral copies (<131 copies/mL). A negative result must be combined with clinical observations, patient history, and epidemiological information. The expected result is Negative. Fact Sheet for Patients:  PinkCheek.be Fact Sheet for Healthcare Providers:  GravelBags.it This test is not yet ap proved or cleared by the [place] FDA and  has been authorized for detection and/or diagnosis of SARS-CoV-2 by FDA under an Emergency Use Authorization (EUA). This EUA will remain  in effect (meaning this test can be used) for the duration of the COVID-19 declaration under Section 564(b)(1) of the Act, 21 U.S.C. section 360bbb-3(b)(1), unless the authorization is terminated or revoked sooner.    Influenza A by PCR NEGATIVE NEGATIVE   Influenza B by PCR NEGATIVE NEGATIVE    Comment: (NOTE) The Xpert Xpress SARS-CoV-2/FLU/RSV assay is intended as an aid in  the diagnosis of influenza from Nasopharyngeal swab specimens and  should not be used as a sole basis for treatment. Nasal washings and  aspirates are unacceptable for Xpert Xpress SARS-CoV-2/FLU/RSV  testing. Fact Sheet for Patients: PinkCheek.be Fact Sheet for Healthcare Providers: GravelBags.it This test is not yet  approved or cleared by the [place] FDA and  has been authorized for detection and/or diagnosis of SARS-CoV-2 by  FDA under an Emergency Use Authorization (EUA). This EUA will remain  in effect (meaning this test can be used) for the duration of the  Covid-19 declaration under Section 564(b)(1) of the Act, 21  U.S.C. section 360bbb-3(b)(1), unless the authorization is  terminated or revoked. Performed at [facility], [address], [place], [place] [address]   Glucose, capillary     Status: Abnormal   Collection Time: 12/01/19  4:33 AM  Result Value Ref Range   Glucose-Capillary 158 (H) 70 - 99 mg/dL    Comment: Glucose reference range applies only to samples taken after fasting for at least 8 hours.  Basic metabolic panel     Status: Abnormal   Collection Time: 12/01/19  5:22 AM  Result Value Ref Range   Sodium 138 135 - 145 mmol/L   Potassium 4.0 3.5 - 5.1 mmol/L   Chloride 110 98 - 111 mmol/L   CO2 18 (L) 22 - 32 mmol/L   Glucose, Bld 186 (H) 70 - 99 mg/dL    Comment: Glucose reference range applies only to samples taken after fasting for at least 8 hours.   BUN 79 (H) 6 - 20 mg/dL   Creatinine, Ser 8.05 (H) 0.61 - 1.24 mg/dL   Calcium 7.6 (L) 8.9 - 10.3 mg/dL   GFR calc non Af Amer 7 (L) >60 mL/min   GFR calc Af Amer 8 (L) >60 mL/min   Anion gap 10 5 - 15    Comment: Performed at [facility], [address], [place], [place] [address]  CBC     Status: Abnormal   Collection Time: 12/01/19  5:22 AM  Result Value Ref Range   WBC 5.6 4.0 - 10.5 K/uL   RBC 2.92 (L) 4.22 - 5.81 MIL/uL   Hemoglobin 7.8 (L) 13.0 - 17.0 g/dL   HCT 25.4 (L) 39.0 - 52.0 %   MCV 87.0 80.0 - 100.0 fL   MCH 26.7 26.0 - 34.0 pg   MCHC 30.7 30.0 - 36.0 g/dL   RDW 13.2 11.5 - 15.5 %   Platelets 155 150 - 400 K/uL   nRBC 0.0 0.0 - 0.2 %  Comment: Performed at [facility], [address], [place], [place] [address]  Glucose, capillary     Status: Abnormal   Collection Time: 12/01/19  8:42 AM   Result Value Ref Range   Glucose-Capillary 162 (H) 70 - 99 mg/dL    Comment: Glucose reference range applies only to samples taken after fasting for at least 8 hours.    DG Chest 2 View  Result Date: 12/01/2019 CLINICAL DATA:  Shortness of breath EXAM: CHEST - 2 VIEW COMPARISON:  CT 11/24/2018 FINDINGS: Heart and mediastinal contours are within normal limits. No focal opacities or effusions. No acute bony abnormality. IMPRESSION: No active cardiopulmonary disease. Electronically Signed   By: Rolm Baptise M.D.   On: 12/01/2019 01:38    ROS: all other systems reviewed and are negative except as per HPI Blood pressure (!) 156/82, pulse 90, temperature 98.2 F (36.8 C), temperature source Oral, resp. rate 20, height 6' (1.829 m), weight 106.2 kg, SpO2 99 %. .  GEN NAD, sitting in bed, eating breakfast HEENT sclerae anicteric, facial fullness NECK + JVD upright PULM normal WOB clear bilaterally no c/w/r CV RRR II/VI systolic murmur ABD soft nontender NABS EXT 2+ tight LE edema NEURO AAO x 3 no asterixis SKIN warm and dry no rashes MSK no effusions  Assessment/Plan: 1 AKI on CKD: Cr 4.4--> 7.8-->8.0.  He still has significant volume overload and Cr up with diuresis, will need to start dialysis for volume overload and progressive renal failure.  D/w pt and son at bedside, in agreement.  Will consult gen surg for HD catheter placement so we can initiate HD during this admission.  Lasix until then.   2 DM II: per primary 3 Hypertension: on metop and amlodipine 4. Anemia of ESRD: Hbg 7.8, will check iron stores and give ESA 5. Metabolic Bone Disease: on calcitriol, wil lcheck PTH 6.  Dispo: will need CLIP once we initiate HD  Belisa Eichholz 12/01/2019, 9:49 AM

## 2019-12-01 NOTE — Progress Notes (Signed)
Patient seen and evaluated, chart reviewed, please see EMR for updated orders. Please see full H&P dictated by admitting physician Dr. Scherrie November for same date of service.    54 y.o. male with medical history significant of hypertension, diabetes mellitus type 2, retinopathy, neuropathy, chronic kidney disease admitted on 12/01/2019 with dyspnea on exertion and lower extremity edema found to have volume overload status  --Discussed with nephrology and general surgery service plan is for IV diuresis at this time pending temporary HD catheter placement for possible hemodialysis  Patient seen and evaluated, chart reviewed, please see EMR for updated orders. Please see full H&P dictated by admitting physician Dr. Scherrie November for same date of service.   Roxan Hockey, MD

## 2019-12-01 NOTE — H&P (View-Only) (Signed)
[facility] Consult  Reason for Consult: Dialysis access  Referring Physician: Dr. Denton Brick, Dr. Hollie Salk   Chief Complaint    Abnormal Lab      HPI: Mozell Hardacre III is a 54 y.o. male with worsening renal failure who needs to initiate dialysis. He has a history of HTN and Diabetes. He was going to get a fistula but never was able to do this due to losing his insurance. He has never had to get a catheter prior. He has a history of an infection over his left clavicular head and multiple debridements/ surgeries/ wound vacuum to this area. It is suspected he had bacteremia and then developed this abscess per his reported. He came to the hospital this admission with lower extremity swelling over the past week. He had been by his nephrologist and due to worsening labs, he was sent to the hospital for further management.   Currently he says he is doing well and has been using the restroom a lot.   Past Medical History:  Diagnosis Date  . Diabetes mellitus    Type 2   . Diabetic retinopathy of both eyes ([place]) 01/31/2019   Dr Coralyn Pear 01/2019  . Herpes simplex virus (HSV) infection    OU  . Hypertension   . Renal insufficiency    ckd stage 4  . Retinal detachment    OS  . Sepsis due to Streptococcus, group B ([place]) 10/05/2018    Past Surgical History:  Procedure Laterality Date  . [place] VITRECTOMY WITH 20 GAUGE MVR PORT FOR MACULAR HOLE Right 02/24/2019   Procedure: 25 GAUGE PARS PLANA VITRECTOMY WITH 20 GAUGE MVR PORT FOR MACULAR HOLE;  Surgeon: Bernarda Caffey, MD;  Location: [place];  Service: Ophthalmology;  Laterality: Right;  . APPLICATION OF WOUND VAC Left 08/27/2018   Procedure: APPLICATION OF WOUND VAC, left neck;  Surgeon: Gaye Pollack, MD;  Location: [place];  Service: Thoracic;  Laterality: Left;  . APPLICATION OF WOUND VAC Left 08/30/2018   Procedure: APPLICATION OF WOUND VAC;  Surgeon: Gaye Pollack, MD;  Location: [place];  Service: Thoracic;  Laterality:  Left;  . CATARACT EXTRACTION Bilateral   . CATARACT EXTRACTION W/ INTRAOCULAR LENS  IMPLANT, BILATERAL    . COLONOSCOPY  06/24/2011   Procedure: COLONOSCOPY;  Surgeon: [name], MD;  [facility];  Service: Endoscopy;  Laterality: N/A;  8:30 AM  . EYE SURGERY    . GAS/FLUID EXCHANGE Left 03/31/2019   Procedure: Gas/Fluid Exchange;  Surgeon: Bernarda Caffey, MD;  Location: [place];  Service: Ophthalmology;  Laterality: Left;  . INJECTION OF SILICONE OIL  02/05/3661   Procedure: Injection Of Silicone Oil;  Surgeon: Bernarda Caffey, MD;  Location: [place];  Service: Ophthalmology;;  . MEMBRANE PEEL Right 02/24/2019   Procedure: Antoine Primas;  Surgeon: Bernarda Caffey, MD;  Location: [place];  Service: Ophthalmology;  Laterality: Right;  . PARS PLANA VITRECTOMY Left 03/31/2019   Procedure: PARS PLANA VITRECTOMY WITH 25 GAUGE WITH MEMBRANE PEEL;  Surgeon: Bernarda Caffey, MD;  Location: [place];  Service: Ophthalmology;  Laterality: Left;  . PHOTOCOAGULATION WITH LASER Right 02/24/2019   Procedure: Photocoagulation With Laser;  Surgeon: Bernarda Caffey, MD;  Location: [place];  Service: Ophthalmology;  Laterality: Right;  . PHOTOCOAGULATION WITH LASER Left 03/31/2019   Procedure: Photocoagulation With Laser;  Surgeon: Bernarda Caffey, MD;  Location: [place];  Service: Ophthalmology;  Laterality: Left;  . RETINAL DETACHMENT SURGERY Left 03/31/2019  TRD Repair - Dr. Bernarda Caffey  . SILICON OIL REMOVAL Right 9/35/7017   Procedure: Silicon Oil Removal;  Surgeon: Bernarda Caffey, MD;  Location: [place];  Service: Ophthalmology;  Laterality: Right;  . STERNAL WOUND DEBRIDEMENT Left 08/27/2018   Procedure: Incision and DEBRIDEMENT Left Chest, Neck and Mediastinum;  Surgeon: Gaye Pollack, MD;  Location: [facility];  Service: Thoracic;  Laterality: Left;  . STERNAL WOUND DEBRIDEMENT Left 08/30/2018   Procedure: WOUND VAC CHANGE, LEFT CHEST AND NECK, POSSIBLE DEBRIDEMENT;  Surgeon: Gaye Pollack, MD;  [facility];   Service: Thoracic;  Laterality: Left;    Family History  Problem Relation Age of Onset  . Hypertension Mother   . Colon cancer Neg Hx     Social History   Tobacco Use  . Smoking status: Never Smoker  . Smokeless tobacco: Never Used  Substance Use Topics  . Alcohol use: No  . Drug use: No    Medications:  I have reviewed the patient's current medications. Prior to Admission:  Medications Prior to Admission  Medication Sig Dispense Refill Last Dose  . acetaminophen (TYLENOL) 500 MG tablet Take 1,000 mg by mouth every 6 (six) hours as needed for moderate pain or headache.     Marland Kitchen amLODipine (NORVASC) 10 MG tablet TAKE (1) TABLET BY MOUTH ONCE DAILY. 30 tablet 0   . atorvastatin (LIPITOR) 10 MG tablet TAKE ONE TABLET BY MOUTH ONCE DAILY. 30 tablet 0   . bacitracin-polymyxin b (POLYSPORIN) ophthalmic ointment Place 1 application into both eyes at bedtime. (Patient taking differently: Place 1 application into both eyes 4 (four) times daily. ) 3.5 g 3   . brimonidine (ALPHAGAN) 0.2 % ophthalmic solution Place 1 drop into both eyes daily. 10 mL 10   . Bromfenac Sodium (PROLENSA) 0.07 % SOLN Place 1 drop into both eyes 4 (four) times daily. 3 mL 10   . calcitRIOL (ROCALTROL) 0.25 MCG capsule Take 0.25 mcg by mouth 3 (three) times a week.     . Continuous Blood Gluc Sensor (FREESTYLE LIBRE SENSOR SYSTEM) MISC Use one sensor every 10 days. 3 each 2   . famotidine (PEPCID) 40 MG tablet Take one tablet by mouth daily (Patient taking differently: Take 40 mg by mouth daily as needed for heartburn. ) 30 tablet 5   . furosemide (LASIX) 40 MG tablet One to 2 tablets qam 60 tablet 5   . gatifloxacin (ZYMAXID) 0.5 % SOLN Place 1 drop into both eyes 4 (four) times daily.     Marland Kitchen glucose blood (BAYER CONTOUR NEXT TEST) test strip USE TO TEST FOUR TIMES DAILY. 150 each 2   . hydrALAZINE (APRESOLINE) 50 MG tablet Take 50 mg by mouth 3 (three) times daily.     . insulin lispro (HUMALOG KWIKPEN) 100 UNIT/ML  KwikPen Inject 0.06 mLs (6 Units total) into the skin daily with breakfast AND 0.07 mLs (7 Units total) 2 (two) times daily before lunch and supper. (Patient taking differently: inject 5-9 unit with each meal) 15 mL 11   . Insulin Pen Needle (BD PEN NEEDLE NANO 2ND GEN) 32G X 4 MM MISC Four times daily 150 each 11   . LOKELMA 10 g PACK packet SMARTSIG:1 Packet(s) By Mouth 3 Times a Week     . metoprolol tartrate (LOPRESSOR) 50 MG tablet Take one half tablet by mouth twice daily (Patient taking differently: Take 25 mg by mouth 2 (two) times daily. ) 60 tablet 3   . MICROLET LANCETS MISC  USE FOUR TIMES A DAY AS DIRECTED. 100 each 0   . nystatin (MYCOSTATIN/NYSTOP) powder Apply topically 2 (two) times daily. To groin and scrotum (Patient taking differently: Apply 1 g topically 2 (two) times daily as needed (rash). To groin and scrotum) 15 g 0   . pantoprazole (PROTONIX) 40 MG tablet Take 1 tablet (40 mg total) by mouth daily. 30 tablet 4   . prednisoLONE acetate (PRED FORTE) 1 % ophthalmic suspension Place 1 drop into both eyes 4 (four) times daily. 15 mL 3   . sildenafil (REVATIO) 20 MG tablet May take up to 5 before relations as directed 50 tablet 4   . Simethicone (GAS-X PO) Take 1 tablet by mouth daily as needed (gas).     . sodium bicarbonate 650 MG tablet Take 650 mg by mouth 2 (two) times daily.     . TRESIBA FLEXTOUCH 100 UNIT/ML SOPN FlexTouch Pen Inject 0.19 mLs (19 Units total) into the skin at bedtime. (Patient taking differently: Inject 22 Units into the skin at bedtime. ) 15 mL 1   . Vitamin D, Ergocalciferol, (DRISDOL) 1.25 MG (50000 UT) CAPS capsule Take 1.25 Units by mouth once a week.      Scheduled: . amLODipine  10 mg Oral Daily  . atorvastatin  10 mg Oral Daily  . [START ON 12/02/2019] calcitRIOL  0.25 mcg Oral Once per day on Mon Wed Fri  . furosemide  80 mg Intravenous Q6H  . heparin  5,000 Units Subcutaneous Q8H  . hydrALAZINE  50 mg Oral TID  . insulin aspart  0-15 Units  Subcutaneous TID WC  . insulin aspart  0-5 Units Subcutaneous QHS  . insulin glargine  22 Units Subcutaneous QHS  . metoprolol tartrate  25 mg Oral BID  . pantoprazole  40 mg Oral Daily  . sodium bicarbonate  650 mg Oral BID  . sodium chloride flush  3 mL Intravenous Q12H   Continuous: . sodium chloride     UMP:NTIRWE chloride, bisacodyl, famotidine, ondansetron **OR** ondansetron (ZOFRAN) IV, polyethylene glycol, simethicone, sodium chloride flush  Allergies  Allergen Reactions  . Tramadol Nausea And Vomiting     ROS:  A comprehensive review of systems was negative except for: Respiratory: positive for shortness of breath Musculoskeletal: positive for lower extremity swelling  Blood pressure (!) 156/82, pulse 90, temperature 98.2 F (36.8 C), temperature source Oral, resp. rate 20, height 6' (1.829 m), weight 106.2 kg, SpO2 99 %. Physical Exam Vitals reviewed.  Constitutional:      Appearance: He is normal weight.  HENT:     Head: Normocephalic and atraumatic.     Nose: Nose normal.     Mouth/Throat:     Mouth: Mucous membranes are moist.  Eyes:     Extraocular Movements: Extraocular movements intact.  Cardiovascular:     Rate and Rhythm: Normal rate.  Pulmonary:     Effort: Pulmonary effort is normal.  Abdominal:     General: There is no distension.     Palpations: Abdomen is soft.     Tenderness: There is no abdominal tenderness.  Musculoskeletal:        General: Swelling present.     Cervical back: Normal range of motion.  Neurological:     General: No focal deficit present.     Mental Status: He is alert and oriented to person, place, and time.  Psychiatric:        Mood and Affect: Mood normal.  Behavior: Behavior normal.        Thought Content: Thought content normal.        Judgment: Judgment normal.     Results: Results for orders placed or performed during the hospital encounter of 12/01/19 (from the past 48 hour(s))  Basic metabolic panel      Status: Abnormal   Collection Time: 12/01/19  1:34 AM  Result Value Ref Range   Sodium 138 135 - 145 mmol/L   Potassium 4.2 3.5 - 5.1 mmol/L   Chloride 108 98 - 111 mmol/L   CO2 19 (L) 22 - 32 mmol/L   Glucose, Bld 190 (H) 70 - 99 mg/dL    Comment: Glucose reference range applies only to samples taken after fasting for at least 8 hours.   BUN 77 (H) 6 - 20 mg/dL   Creatinine, Ser 7.71 (H) 0.61 - 1.24 mg/dL   Calcium 7.8 (L) 8.9 - 10.3 mg/dL   GFR calc non Af Amer 7 (L) >60 mL/min   GFR calc Af Amer 8 (L) >60 mL/min   Anion gap 11 5 - 15    Comment: Performed at [facility], [address], [place], [place] [address]  Respiratory Panel by RT PCR (Flu A&B, Covid) - Nasopharyngeal Swab     Status: None   Collection Time: 12/01/19  2:31 AM   Specimen: Nasopharyngeal Swab  Result Value Ref Range   SARS Coronavirus 2 by RT PCR NEGATIVE NEGATIVE    Comment: (NOTE) SARS-CoV-2 target nucleic acids are NOT DETECTED. The SARS-CoV-2 RNA is generally detectable in upper respiratoy specimens during the acute phase of infection. The lowest concentration of SARS-CoV-2 viral copies this assay can detect is 131 copies/mL. A negative result does not preclude SARS-Cov-2 infection and should not be used as the sole basis for treatment or other patient management decisions. A negative result may occur with  improper specimen collection/handling, submission of specimen other than nasopharyngeal swab, presence of viral mutation(s) within the areas targeted by this assay, and inadequate number of viral copies (<131 copies/mL). A negative result must be combined with clinical observations, patient history, and epidemiological information. The expected result is Negative. Fact Sheet for Patients:  PinkCheek.be Fact Sheet for Healthcare Providers:  GravelBags.it This test is not yet ap proved or cleared by the [place] FDA and  has been  authorized for detection and/or diagnosis of SARS-CoV-2 by FDA under an Emergency Use Authorization (EUA). This EUA will remain  in effect (meaning this test can be used) for the duration of the COVID-19 declaration under Section 564(b)(1) of the Act, 21 U.S.C. section 360bbb-3(b)(1), unless the authorization is terminated or revoked sooner.    Influenza A by PCR NEGATIVE NEGATIVE   Influenza B by PCR NEGATIVE NEGATIVE    Comment: (NOTE) The Xpert Xpress SARS-CoV-2/FLU/RSV assay is intended as an aid in  the diagnosis of influenza from Nasopharyngeal swab specimens and  should not be used as a sole basis for treatment. Nasal washings and  aspirates are unacceptable for Xpert Xpress SARS-CoV-2/FLU/RSV  testing. Fact Sheet for Patients: PinkCheek.be Fact Sheet for Healthcare Providers: GravelBags.it This test is not yet approved or cleared by the [place] FDA and  has been authorized for detection and/or diagnosis of SARS-CoV-2 by  FDA under an Emergency Use Authorization (EUA). This EUA will remain  in effect (meaning this test can be used) for the duration of the  Covid-19 declaration under Section 564(b)(1) of the Act, 21  U.S.C. section 360bbb-3(b)(1),  unless the authorization is  terminated or revoked. Performed at [facility], [address], [place], [place] [address]   Glucose, capillary     Status: Abnormal   Collection Time: 12/01/19  4:33 AM  Result Value Ref Range   Glucose-Capillary 158 (H) 70 - 99 mg/dL    Comment: Glucose reference range applies only to samples taken after fasting for at least 8 hours.  Basic metabolic panel     Status: Abnormal   Collection Time: 12/01/19  5:22 AM  Result Value Ref Range   Sodium 138 135 - 145 mmol/L   Potassium 4.0 3.5 - 5.1 mmol/L   Chloride 110 98 - 111 mmol/L   CO2 18 (L) 22 - 32 mmol/L   Glucose, Bld 186 (H) 70 - 99 mg/dL    Comment: Glucose reference range  applies only to samples taken after fasting for at least 8 hours.   BUN 79 (H) 6 - 20 mg/dL   Creatinine, Ser 8.05 (H) 0.61 - 1.24 mg/dL   Calcium 7.6 (L) 8.9 - 10.3 mg/dL   GFR calc non Af Amer 7 (L) >60 mL/min   GFR calc Af Amer 8 (L) >60 mL/min   Anion gap 10 5 - 15    Comment: Performed at [facility], [address] [place], [place] [address]  CBC     Status: Abnormal   Collection Time: 12/01/19  5:22 AM  Result Value Ref Range   WBC 5.6 4.0 - 10.5 K/uL   RBC 2.92 (L) 4.22 - 5.81 MIL/uL   Hemoglobin 7.8 (L) 13.0 - 17.0 g/dL   HCT 25.4 (L) 39.0 - 52.0 %   MCV 87.0 80.0 - 100.0 fL   MCH 26.7 26.0 - 34.0 pg   MCHC 30.7 30.0 - 36.0 g/dL   RDW 13.2 11.5 - 15.5 %   Platelets 155 150 - 400 K/uL   nRBC 0.0 0.0 - 0.2 %    Comment: Performed at [facility], [address], [place], [place] [address]  Glucose, capillary     Status: Abnormal   Collection Time: 12/01/19  8:42 AM  Result Value Ref Range   Glucose-Capillary 162 (H) 70 - 99 mg/dL    Comment: Glucose reference range applies only to samples taken after fasting for at least 8 hours.  Glucose, capillary     Status: Abnormal   Collection Time: 12/01/19 12:07 PM  Result Value Ref Range   Glucose-Capillary 166 (H) 70 - 99 mg/dL    Comment: Glucose reference range applies only to samples taken after fasting for at least 8 hours.    DG Chest 2 View  Result Date: 12/01/2019 CLINICAL DATA:  Shortness of breath EXAM: CHEST - 2 VIEW COMPARISON:  CT 11/24/2018 FINDINGS: Heart and mediastinal contours are within normal limits. No focal opacities or effusions. No acute bony abnormality. IMPRESSION: No active cardiopulmonary disease. Electronically Signed   By: Rolm Baptise M.D.   On: 12/01/2019 01:38     Assessment & Plan:  Jeffren Dombek III is a 54 y.o. male with worsening renal failure. He will need a tunneled dialysis catheter. The soonest I can place at [facility] is Monday. I have put him on the schedule for then. If he needs  something sooner then will need to get a temporary catheter or go to IR at [facility] for placement.   -Discussed risk of bleeding, infection, malfunction, injury to vessels, use of fluorsoscopy, and risk of pneumothorax.  - EKG unchanged, CXR  without acute process - INR in AM  -COVID negative  - OR Monday for tunneled dialysis catheter  All questions were answered to the satisfaction of the patient and family. Discussed with Dr. Denton Brick who will further discuss with nephrology as needed prior to Monday.   Dr. Arnoldo Morale aware of patient and potential need for temporary catheter if things worsen.   Virl Cagey 12/01/2019, 12:43 PM

## 2019-12-02 DIAGNOSIS — I10 Essential (primary) hypertension: Secondary | ICD-10-CM

## 2019-12-02 LAB — IRON AND TIBC
Iron: 36 ug/dL — ABNORMAL LOW (ref 45–182)
Saturation Ratios: 19 % (ref 17.9–39.5)
TIBC: 190 ug/dL — ABNORMAL LOW (ref 250–450)
UIBC: 154 ug/dL

## 2019-12-02 LAB — CBC WITH DIFFERENTIAL/PLATELET
Abs Immature Granulocytes: 0.02 10*3/uL (ref 0.00–0.07)
Basophils Absolute: 0 10*3/uL (ref 0.0–0.1)
Basophils Relative: 1 %
Eosinophils Absolute: 0.2 10*3/uL (ref 0.0–0.5)
Eosinophils Relative: 4 %
HCT: 23.9 % — ABNORMAL LOW (ref 39.0–52.0)
Hemoglobin: 7.6 g/dL — ABNORMAL LOW (ref 13.0–17.0)
Immature Granulocytes: 1 %
Lymphocytes Relative: 29 %
Lymphs Abs: 1.2 10*3/uL (ref 0.7–4.0)
MCH: 27.4 pg (ref 26.0–34.0)
MCHC: 31.8 g/dL (ref 30.0–36.0)
MCV: 86.3 fL (ref 80.0–100.0)
Monocytes Absolute: 0.5 10*3/uL (ref 0.1–1.0)
Monocytes Relative: 11 %
Neutro Abs: 2.4 10*3/uL (ref 1.7–7.7)
Neutrophils Relative %: 54 %
Platelets: 164 10*3/uL (ref 150–400)
RBC: 2.77 MIL/uL — ABNORMAL LOW (ref 4.22–5.81)
RDW: 13.1 % (ref 11.5–15.5)
WBC: 4.3 10*3/uL (ref 4.0–10.5)
nRBC: 0 % (ref 0.0–0.2)

## 2019-12-02 LAB — RENAL FUNCTION PANEL
Albumin: 2.4 g/dL — ABNORMAL LOW (ref 3.5–5.0)
Anion gap: 11 (ref 5–15)
BUN: 85 mg/dL — ABNORMAL HIGH (ref 6–20)
CO2: 20 mmol/L — ABNORMAL LOW (ref 22–32)
Calcium: 8.1 mg/dL — ABNORMAL LOW (ref 8.9–10.3)
Chloride: 109 mmol/L (ref 98–111)
Creatinine, Ser: 8.04 mg/dL — ABNORMAL HIGH (ref 0.61–1.24)
GFR calc Af Amer: 8 mL/min — ABNORMAL LOW (ref >60)
GFR calc non Af Amer: 7 mL/min — ABNORMAL LOW (ref >60)
Glucose, Bld: 97 mg/dL (ref 70–99)
Phosphorus: 6.2 mg/dL — ABNORMAL HIGH (ref 2.5–4.6)
Potassium: 4.1 mmol/L (ref 3.5–5.1)
Sodium: 140 mmol/L (ref 135–145)

## 2019-12-02 LAB — GLUCOSE, CAPILLARY
Glucose-Capillary: 97 mg/dL (ref 70–99)
Glucose-Capillary: 191 mg/dL — ABNORMAL HIGH (ref 70–99)
Glucose-Capillary: 222 mg/dL — ABNORMAL HIGH (ref 70–99)
Glucose-Capillary: 149 mg/dL — ABNORMAL HIGH (ref 70–99)

## 2019-12-02 LAB — PROTIME-INR
INR: 1.1 (ref 0.8–1.2)
Prothrombin Time: 14 seconds (ref 11.4–15.2)

## 2019-12-02 LAB — FERRITIN: Ferritin: 343 ng/mL — ABNORMAL HIGH (ref 24–336)

## 2019-12-02 MED ORDER — FUROSEMIDE 10 MG/ML IJ SOLN
80.0000 mg | Freq: Three times a day (TID) | INTRAMUSCULAR | Status: DC
  Administered 2019-12-02 – 2019-12-06 (×11): 80 mg via INTRAVENOUS
  Filled 2019-12-02 (×11): qty 8

## 2019-12-02 MED ORDER — SODIUM CHLORIDE 0.9 % IV SOLN
510.0000 mg | INTRAVENOUS | Status: DC
  Administered 2019-12-02: 510 mg via INTRAVENOUS
  Filled 2019-12-02 (×2): qty 17

## 2019-12-02 NOTE — Progress Notes (Signed)
Patient Demographics:    Randy Oneal, is a 54 y.o. male, DOB - 08/06/1966, EAV:[id number]  Admit date - 12/01/2019   Admitting Physician Lynetta Mare, MD  Outpatient Primary MD for the patient is [name], MD  LOS - 1   Chief Complaint  Patient presents with  . Abnormal Lab        Subjective:    Randy Oneal today has no fevers, no emesis,  No chest pain, --voiding well, no shortness of breath at rest  Assessment  & Plan :    Principal Problem:   Acute on chronic renal failure (HCC) Active Problems:   Type 2 diabetes mellitus not at goal [facility])   HTN (hypertension), benign   Hyperlipidemia associated with type 2 diabetes mellitus ([place])   Diabetic neuropathy (HCC)   ESRD needing dialysis [facility])  Brief Summary:- 53 y.o.malewith medical history significant ofhypertension, diabetes mellitus type 2, retinopathy, neuropathy, chronic kidney disease admitted on 12/01/2019 with dyspnea on exertion and lower extremity edema found to have volume overload status   1)Aki on CKD IV--- progression towards ESRD --Discussed with nephrology and general surgery service plan is for IV diuresis with Lasix 80 mg IV 3 times daily at this time pending temporary HD catheter placement for possible hemodialysis on Monday, 12/05/2019 --Patient will need clipping process after that  2)HTN--- improved BP control on amlodipine 10 mg daily, metoprolol 25 mg twice daily , hydralazine 50 mg 3 times daily, and IV Lasix  3)Anemia of CKD--hemoglobin 7.8--- Feraheme per nephrologist on 12/02/2019 -Defer decision on ESA/Epogen to nephrology team  4)DM2--A1c is 8.8 reflecting uncontrolled DM PTA, Use Novolog/Humalog Sliding scale insulin with Accu-Cheks/Fingersticks as ordered -Continue Lantus 22 units nightly  Disposition/Need for in-Hospital Stay- patient unable to be discharged at this time due to --- need IV  diuresis and hemodialysis initiation* -Patient From: home D/C Place: home Barriers: Not Clinically Stable- --- patient will need clipping process and outpatient hemodialysis spot/chair prior to discharge  Code Status : full  Family Communication:   NA (patient is alert, awake and coherent)  Consults  :  Nephrology/Gen Surgery  DVT Prophylaxis  :    - Heparin - SCDs  Lab Results  Component Value Date   PLT 164 12/02/2019    Inpatient Medications  Scheduled Meds: . amLODipine  10 mg Oral Daily  . atorvastatin  10 mg Oral Daily  . calcitRIOL  0.25 mcg Oral Once per day on Mon Wed Fri  . furosemide  80 mg Intravenous Q8H  . heparin  5,000 Units Subcutaneous Q8H  . hydrALAZINE  50 mg Oral TID  . insulin aspart  0-15 Units Subcutaneous TID WC  . insulin aspart  0-5 Units Subcutaneous QHS  . insulin glargine  22 Units Subcutaneous QHS  . metoprolol tartrate  25 mg Oral BID  . pantoprazole  40 mg Oral Daily  . sodium bicarbonate  650 mg Oral BID  . sodium chloride flush  3 mL Intravenous Q12H   Continuous Infusions: . sodium chloride    . ferumoxytol 510 mg (12/02/19 1315)   PRN Meds:.sodium chloride, bisacodyl, famotidine, ondansetron **OR** ondansetron (ZOFRAN) IV, polyethylene glycol, simethicone, sodium chloride flush    Anti-infectives (From admission, onward)   None  Objective:   Vitals:   12/02/19 0751 12/02/19 0921 12/02/19 1456 12/02/19 1719  BP:  (!) 168/89 (!) 148/84 (!) 157/88  Pulse:  80 76   Resp:   16   Temp:   98 F (36.7 C)   TempSrc:   Oral   SpO2: 97%  98%   Weight:      Height:        Wt Readings from Last 3 Encounters:  12/02/19 100.8 kg  11/30/19 105.8 kg  06/15/19 88.5 kg     Intake/Output Summary (Last 24 hours) at 12/02/2019 1930 Last data filed at 12/02/2019 1900 Gross per 24 hour  Intake 344.2 ml  Output 5725 ml  Net -5380.8 ml     Physical Exam  Gen:- Awake Alert,  In no apparent distress  HEENT:- [place].AT, No  sclera icterus , prominent eyeballs difficulty keeping eyes open Neck-Supple Neck,No JVD,.  Lungs-  CTAB , fair symmetrical air movement CV- S1, S2 normal, regular  Abd-  +ve B.Sounds, Abd Soft, No tenderness,    Extremity/Skin:-2-3+ pitting edema, pedal pulses present  Psych-affect is appropriate, oriented x3 Neuro-no new focal deficits, no tremors   Data Review:   Micro Results Recent Results (from the past 240 hour(s))  Respiratory Panel by RT PCR (Flu A&B, Covid) - Nasopharyngeal Swab     Status: None   Collection Time: 12/01/19  2:31 AM   Specimen: Nasopharyngeal Swab  Result Value Ref Range Status   SARS Coronavirus 2 by RT PCR NEGATIVE NEGATIVE Final    Comment: (NOTE) SARS-CoV-2 target nucleic acids are NOT DETECTED. The SARS-CoV-2 RNA is generally detectable in upper respiratoy specimens during the acute phase of infection. The lowest concentration of SARS-CoV-2 viral copies this assay can detect is 131 copies/mL. A negative result does not preclude SARS-Cov-2 infection and should not be used as the sole basis for treatment or other patient management decisions. A negative result may occur with  improper specimen collection/handling, submission of specimen other than nasopharyngeal swab, presence of viral mutation(s) within the areas targeted by this assay, and inadequate number of viral copies (<131 copies/mL). A negative result must be combined with clinical observations, patient history, and epidemiological information. The expected result is Negative. Fact Sheet for Patients:  PinkCheek.be Fact Sheet for Healthcare Providers:  GravelBags.it This test is not yet ap proved or cleared by the [place] FDA and  has been authorized for detection and/or diagnosis of SARS-CoV-2 by FDA under an Emergency Use Authorization (EUA). This EUA will remain  in effect (meaning this test can be used) for the duration of  the COVID-19 declaration under Section 564(b)(1) of the Act, 21 U.S.C. section 360bbb-3(b)(1), unless the authorization is terminated or revoked sooner.    Influenza A by PCR NEGATIVE NEGATIVE Final   Influenza B by PCR NEGATIVE NEGATIVE Final    Comment: (NOTE) The Xpert Xpress SARS-CoV-2/FLU/RSV assay is intended as an aid in  the diagnosis of influenza from Nasopharyngeal swab specimens and  should not be used as a sole basis for treatment. Nasal washings and  aspirates are unacceptable for Xpert Xpress SARS-CoV-2/FLU/RSV  testing. Fact Sheet for Patients: PinkCheek.be Fact Sheet for Healthcare Providers: GravelBags.it This test is not yet approved or cleared by the [place] FDA and  has been authorized for detection and/or diagnosis of SARS-CoV-2 by  FDA under an Emergency Use Authorization (EUA). This EUA will remain  in effect (meaning this test can be used) for the duration of the  Covid-19 declaration under Section 564(b)(1) of the Act, 21  U.S.C. section 360bbb-3(b)(1), unless the authorization is  terminated or revoked. Performed at [facility], [address] [place], [place] [address]     Radiology Reports DG Chest 2 View  Result Date: 12/01/2019 CLINICAL DATA:  Shortness of breath EXAM: CHEST - 2 VIEW COMPARISON:  CT 11/24/2018 FINDINGS: Heart and mediastinal contours are within normal limits. No focal opacities or effusions. No acute bony abnormality. IMPRESSION: No active cardiopulmonary disease. Electronically Signed   By: Rolm Baptise M.D.   On: 12/01/2019 01:38     CBC Recent Labs  Lab 11/30/19 1236 12/01/19 0522 12/02/19 0612  WBC 5.6 5.6 4.3  HGB 8.8* 7.8* 7.6*  HCT 28.4* 25.4* 23.9*  PLT 178 155 164  MCV 87.4 87.0 86.3  MCH 27.1 26.7 27.4  MCHC 31.0 30.7 31.8  RDW 13.2 13.2 13.1  LYMPHSABS 1.1  --  1.2  MONOABS 0.6  --  0.5  EOSABS 0.2  --  0.2  BASOSABS 0.0  --  0.0     Chemistries  Recent Labs  Lab 11/30/19 1236 12/01/19 0134 12/01/19 0522 12/02/19 0612  NA 139 138 138 140  K 4.8 4.2 4.0 4.1  CL 109 108 110 109  CO2 20* 19* 18* 20*  GLUCOSE 175* 190* 186* 97  BUN 74* 77* 79* 85*  CREATININE 7.46* 7.71* 8.05* 8.04*  CALCIUM 8.3* 7.8* 7.6* 8.1*   ------------------------------------------------------------------------------------------------------------------ Recent Labs    11/30/19 1236  CHOL 138  HDL 63  LDLCALC 67  TRIG 42  CHOLHDL 2.2    Lab Results  Component Value Date   HGBA1C 8.8 (H) 11/30/2019   ------------------------------------------------------------------------------------------------------------------ No results for input(s): TSH, T4TOTAL, T3FREE, THYROIDAB in the last 72 hours.  Invalid input(s): FREET3 ------------------------------------------------------------------------------------------------------------------ Recent Labs    12/02/19 0612  FERRITIN 343*  TIBC 190*  IRON 36*    Coagulation profile Recent Labs  Lab 12/02/19 0740  INR 1.1    No results for input(s): DDIMER in the last 72 hours.  Cardiac Enzymes No results for input(s): CKMB, TROPONINI, MYOGLOBIN in the last 168 hours.  Invalid input(s): CK ------------------------------------------------------------------------------------------------------------------    Component Value Date/Time   BNP 212.0 (H) 11/30/2019 1236     Allesha Aronoff M.D on 12/02/2019 at 7:30 PM  Go to [email or web address] - for contact info  [facility] - Office  [phone]

## 2019-12-02 NOTE — TOC Initial Note (Signed)
Transition of Care [facility]) - Initial/Assessment Note   Patient Details  Name: Randy Oneal MRN: [id number] Date of Birth: 04-14-1966  Transition of Care [facility]) CM/SW Contact:    Sherie Don, LCSW Phone Number: 12/02/2019, 2:50 PM  Clinical Narrative: TOC notified patient was in need of outpatient dialysis. Patient is not currently insured. Adam Phenix consulted by [facility] for Medicaid application assistance. Dialysis application started and faxed to [facility]. TOC to follow.                 Expected Discharge Plan: Home/Self Care Barriers to Discharge: Continued Medical Work up, Waiting for outpatient dialysis  Expected Discharge Plan and Services Expected Discharge Plan: Home/Self Care Living arrangements for the past 2 months: Single Family Home  Prior Living Arrangements/Services Living arrangements for the past 2 months: Single Family Home Lives with:: Hatcher Froning (wife) [place]: [phone]) Patient language and need for interpreter reviewed:: Yes Do you feel safe going back to the place where you live?: Yes      Need for Family Participation in Patient Care: No (Comment) Care giver support system in place?: Yes (comment)(Wife)   Criminal Activity/Legal Involvement Pertinent to Current Situation/Hospitalization: No - Comment as needed  Activities of Daily Living Home Assistive Devices/Equipment: CBG Meter, Eyeglasses, Scales ADL Screening (condition at time of admission) Patient's cognitive ability adequate to safely complete daily activities?: Yes Is the patient deaf or have difficulty hearing?: No Does the patient have difficulty seeing, even when wearing glasses/contacts?: Yes Does the patient have difficulty concentrating, remembering, or making decisions?: No Patient able to express need for assistance with ADLs?: Yes Does the patient have difficulty dressing or bathing?: No Independently performs ADLs?: Yes (appropriate for developmental age) Does the patient  have difficulty walking or climbing stairs?: Yes Weakness of Legs: Both Weakness of Arms/Hands: None  Emotional Assessment Appearance:: Appears stated age Attitude/Demeanor/Rapport: Engaged Affect (typically observed): Accepting Orientation: : Oriented to Self, Oriented to Place, Oriented to  Time, Oriented to Situation Alcohol / Substance Use: Not Applicable Psych Involvement: No (comment)  Admission diagnosis:  Acute on chronic renal failure (HCC) [N17.9, N18.9] ESRD needing dialysis ([place]) [N18.6, Z99.2] Essential hypertension [I10] Patient Active Problem List   Diagnosis Date Noted  . Acute on chronic renal failure ([place]) 12/01/2019  . ESRD needing dialysis ([place])   . Skin callus 08/03/2019  . Pain due to onychomycosis of toenails of both feet 04/20/2019  . Posterior tibial tendon dysfunction (PTTD) of both lower extremities 04/20/2019  . Diabetic neuropathy ([place]) 04/20/2019  . Diabetic retinopathy of both eyes ([place]) 01/31/2019  . Type 2 diabetes mellitus with hyperglycemia, with long-term current use of insulin ([place]) 10/20/2018  . Sepsis due to Streptococcus, group B ([place]) 10/05/2018  . Dietary counseling and surveillance 09/15/2018  . Non compliance with medical treatment 09/15/2018  . Open wound of right great toe   . Septic arthritis of left sternoclavicular joint ([place])   . Mediastinal abscess ([place]) 08/27/2018  . Cellulitis of great toe, right   . Diabetic hyperosmolar non-ketotic state ([place]) 08/17/2018  . Hyperglycemia 08/17/2018  . AKI (acute kidney injury) ([place]) 08/16/2018  . Hyponatremia 08/16/2018  . Hyperlipidemia associated with type 2 diabetes mellitus ([place]) 03/04/2017  . Erectile dysfunction 03/04/2017  . Personal history of noncompliance with medical treatment, presenting hazards to health 11/22/2015  . HTN (hypertension), benign 01/30/2014  . Loss of weight 01/30/2014  . Type 2 diabetes mellitus not at goal [facility]) 03/30/2013   PCP:  [name],  MD Pharmacy:   [facility], [address] [place] [address] Phone: [phone] Fax: [phone]  [id number] - [place], [address] [place] [address] Phone: [phone] Fax: [phone]  Readmission Risk Interventions No flowsheet data found.

## 2019-12-02 NOTE — Progress Notes (Signed)
[facility] Progress Note    Assessment/ Plan:   1 AKI on CKD: Cr 4.4--> 7.8-->8.0.  He still has significant volume overload and Cr up with diuresis, will need to start dialysis for volume overload and progressive renal failure.  D/w pt and son at bedside, in agreement.  gen surg c/s for HD catheter placement so we can initiate HD during this admission, greatly appreciate assistance.  Lasix until then 80 IV TID.  TDC planned for Monday and HD #1 then. 2 DM II: per primary 3 Hypertension: on metop and amlodipine 4. Anemia of ESRD: Hbg 7.8, will check iron stores and give ESA--> % sat 19, will give feraheme today 5. 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  Dispo: will need CLIP once we initiate HD  Subjective:    Diuresing, doing well.  TDC planned for Monday along with HD #1.     Objective:   BP (!) 168/89   Pulse 80   Temp 98.5 F (36.9 C) (Oral)   Resp 16   Ht 6' (1.829 m)   Wt 100.8 kg   SpO2 97%   BMI 30.14 kg/m   Intake/Output Summary (Last 24 hours) at 12/02/2019 0959 Last data filed at 12/02/2019 3817 Gross per 24 hour  Intake 480 ml  Output 4325 ml  Net -3845 ml   Weight change: -3.388 kg  Physical Exam: GEN NAD, sitting in bed, eating breakfast HEENT sclerae anicteric, facial fullness NECK + JVD upright PULM normal WOB clear bilaterally no c/w/r CV RRR II/VI systolic murmur ABD soft nontender NABS EXT 2+ tight LE edema, improving somewhat today NEURO AAO x 3 no asterixis SKIN warm and dry no rashes MSK no effusions Imaging: DG Chest 2 View  Result Date: 12/01/2019 CLINICAL DATA:  Shortness of breath EXAM: CHEST - 2 VIEW COMPARISON:  CT 11/24/2018 FINDINGS: Heart and mediastinal contours are within normal limits. No focal opacities or effusions. No acute bony abnormality. IMPRESSION: No active cardiopulmonary disease. Electronically Signed   By: Rolm Baptise M.D.   On: 12/01/2019 01:38    Labs: BMET Recent Labs   Lab 11/30/19 1236 12/01/19 0134 12/01/19 0522 12/02/19 0612  NA 139 138 138 140  K 4.8 4.2 4.0 4.1  CL 109 108 110 109  CO2 20* 19* 18* 20*  GLUCOSE 175* 190* 186* 97  BUN 74* 77* 79* 85*  CREATININE 7.46* 7.71* 8.05* 8.04*  CALCIUM 8.3* 7.8* 7.6* 8.1*  PHOS  --   --   --  6.2*   CBC Recent Labs  Lab 11/30/19 1236 12/01/19 0522 12/02/19 0612  WBC 5.6 5.6 4.3  NEUTROABS 3.8  --  2.4  HGB 8.8* 7.8* 7.6*  HCT 28.4* 25.4* 23.9*  MCV 87.4 87.0 86.3  PLT 178 155 164    Medications:    . amLODipine  10 mg Oral Daily  . atorvastatin  10 mg Oral Daily  . calcitRIOL  0.25 mcg Oral Once per day on Mon Wed Fri  . furosemide  80 mg Intravenous Q6H  . heparin  5,000 Units Subcutaneous Q8H  . hydrALAZINE  50 mg Oral TID  . insulin aspart  0-15 Units Subcutaneous TID WC  . insulin aspart  0-5 Units Subcutaneous QHS  . insulin glargine  22 Units Subcutaneous QHS  . metoprolol tartrate  25 mg Oral BID  . pantoprazole  40 mg Oral Daily  . sodium bicarbonate  650 mg Oral BID  . sodium chloride flush  3 mL Intravenous Q12H      Madelon Lips, MD 12/02/2019, 9:59 AM

## 2019-12-03 DIAGNOSIS — E785 Hyperlipidemia, unspecified: Secondary | ICD-10-CM

## 2019-12-03 DIAGNOSIS — E119 Type 2 diabetes mellitus without complications: Secondary | ICD-10-CM

## 2019-12-03 DIAGNOSIS — E1169 Type 2 diabetes mellitus with other specified complication: Secondary | ICD-10-CM

## 2019-12-03 LAB — GLUCOSE, CAPILLARY
Glucose-Capillary: 59 mg/dL — ABNORMAL LOW (ref 70–99)
Glucose-Capillary: 131 mg/dL — ABNORMAL HIGH (ref 70–99)
Glucose-Capillary: 150 mg/dL — ABNORMAL HIGH (ref 70–99)
Glucose-Capillary: 178 mg/dL — ABNORMAL HIGH (ref 70–99)

## 2019-12-03 LAB — PARATHYROID HORMONE, INTACT (NO CA): PTH: 339 pg/mL — ABNORMAL HIGH (ref 15–65)

## 2019-12-03 MED ORDER — INSULIN GLARGINE 100 UNIT/ML ~~LOC~~ SOLN
20.0000 [IU] | Freq: Every day | SUBCUTANEOUS | Status: DC
  Administered 2019-12-03 – 2019-12-07 (×5): 20 [IU] via SUBCUTANEOUS
  Filled 2019-12-03 (×6): qty 0.2

## 2019-12-03 NOTE — Progress Notes (Signed)
Patient Demographics:    Randy Oneal, is a 54 y.o. male, DOB - 01/31/1966, ESP:[id number]  Admit date - 12/01/2019   Admitting Physician Lynetta Mare, MD  Outpatient Primary MD for the patient is [name], MD  LOS - 2  Chief Complaint  Patient presents with  . Abnormal Lab        Subjective:    Randy Oneal today has no fevers, no emesis,  No chest pain, --voiding well,  -Wife and son at bedside, questions answered -Had episode of hypoglycemia  Assessment  & Plan :    Principal Problem:   Acute on chronic renal failure (HCC) Active Problems:   Type 2 diabetes mellitus not at goal [facility])   HTN (hypertension), benign   Hyperlipidemia associated with type 2 diabetes mellitus ([place])   Diabetic neuropathy (HCC)   ESRD needing dialysis [facility])  Brief Summary:- 53 y.o.malewith medical history significant ofhypertension, diabetes mellitus type 2, retinopathy, neuropathy, chronic kidney disease admitted on 12/01/2019 with dyspnea on exertion and lower extremity edema found to have volume overload status   1)Aki on CKD IV--- progression towards ESRD --Discussed with nephrology and general surgery service plan is for IV diuresis with Lasix 80 mg IV 3 times daily at this time pending temporary HD catheter placement and initiation of hemodialysis on Monday, 12/05/2019 --Patient will need clipping process after that prior to discharge home  2)HTN--- improved BP control on amlodipine 10 mg daily, metoprolol 25 mg twice daily , hydralazine 50 mg 3 times daily, and IV Lasix  3)Anemia of CKD--hemoglobin 7.8--- Feraheme per nephrologist on 12/02/2019 -Defer decision on ESA/Epogen to nephrology team  4)DM2--A1c is 8.8 reflecting uncontrolled DM PTA, Use Novolog/Humalog Sliding scale insulin with Accu-Cheks/Fingersticks as ordered -Episodes of hypoglycemia, query insulin stacking due to worsening renal  function and reduce caloric intake in the hospital decrease Lantus to 20 units nightly  Disposition/Need for in-Hospital Stay- patient unable to be discharged at this time due to --- need IV diuresis and hemodialysis initiation* -Patient From: home D/C Place: home Barriers: Not Clinically Stable- --- patient will need clipping process and outpatient hemodialysis spot/chair prior to discharge  Code Status : full  Family Communication:   NA (patient is alert, awake and coherent)  Consults  :  Nephrology/Gen Surgery  DVT Prophylaxis  :    - Heparin - SCDs  Lab Results  Component Value Date   PLT 164 12/02/2019   Inpatient Medications  Scheduled Meds: . amLODipine  10 mg Oral Daily  . atorvastatin  10 mg Oral Daily  . calcitRIOL  0.25 mcg Oral Once per day on Mon Wed Fri  . furosemide  80 mg Intravenous Q8H  . heparin  5,000 Units Subcutaneous Q8H  . hydrALAZINE  50 mg Oral TID  . insulin aspart  0-15 Units Subcutaneous TID WC  . insulin aspart  0-5 Units Subcutaneous QHS  . insulin glargine  20 Units Subcutaneous QHS  . metoprolol tartrate  25 mg Oral BID  . pantoprazole  40 mg Oral Daily  . sodium bicarbonate  650 mg Oral BID  . sodium chloride flush  3 mL Intravenous Q12H   Continuous Infusions: . sodium chloride    . ferumoxytol 510 mg (12/02/19 1315)  PRN Meds:.sodium chloride, bisacodyl, famotidine, ondansetron **OR** ondansetron (ZOFRAN) IV, polyethylene glycol, simethicone, sodium chloride flush   Anti-infectives (From admission, onward)   None        Objective:   Vitals:   12/02/19 2105 12/03/19 0144 12/03/19 0530 12/03/19 0806  BP: (!) 176/89  (!) 167/89 (!) 156/77  Pulse: 77  80   Resp: 18  16   Temp: 98.6 F (37 C)  97.9 F (36.6 C)   TempSrc:   Oral   SpO2: 97%  97%   Weight:  99.3 kg    Height:        Wt Readings from Last 3 Encounters:  12/03/19 99.3 kg  11/30/19 105.8 kg  06/15/19 88.5 kg    Intake/Output Summary (Last 24 hours) at  12/03/2019 1658 Last data filed at 12/03/2019 1501 Gross per 24 hour  Intake --  Output 4225 ml  Net -4225 ml   Physical Exam  Gen:- Awake Alert,  In no apparent distress  HEENT:- [place].AT, No sclera icterus , prominent eyeballs difficulty keeping eyes open Neck-Supple Neck,No JVD,.  Lungs-diminished in bases, no wheezing  CV- S1, S2 normal, regular  Abd-  +ve B.Sounds, Abd Soft, No tenderness,    Extremity/Skin:-2-3+ pitting edema--improving, pedal pulses present  Psych-affect is appropriate, oriented x3 Neuro-no new focal deficits, no tremors   Data Review:   Micro Results Recent Results (from the past 240 hour(s))  Respiratory Panel by RT PCR (Flu A&B, Covid) - Nasopharyngeal Swab     Status: None   Collection Time: 12/01/19  2:31 AM   Specimen: Nasopharyngeal Swab  Result Value Ref Range Status   SARS Coronavirus 2 by RT PCR NEGATIVE NEGATIVE Final    Comment: (NOTE) SARS-CoV-2 target nucleic acids are NOT DETECTED. The SARS-CoV-2 RNA is generally detectable in upper respiratoy specimens during the acute phase of infection. The lowest concentration of SARS-CoV-2 viral copies this assay can detect is 131 copies/mL. A negative result does not preclude SARS-Cov-2 infection and should not be used as the sole basis for treatment or other patient management decisions. A negative result may occur with  improper specimen collection/handling, submission of specimen other than nasopharyngeal swab, presence of viral mutation(s) within the areas targeted by this assay, and inadequate number of viral copies (<131 copies/mL). A negative result must be combined with clinical observations, patient history, and epidemiological information. The expected result is Negative. Fact Sheet for Patients:  PinkCheek.be Fact Sheet for Healthcare Providers:  GravelBags.it This test is not yet ap proved or cleared by the [place] FDA and   has been authorized for detection and/or diagnosis of SARS-CoV-2 by FDA under an Emergency Use Authorization (EUA). This EUA will remain  in effect (meaning this test can be used) for the duration of the COVID-19 declaration under Section 564(b)(1) of the Act, 21 U.S.C. section 360bbb-3(b)(1), unless the authorization is terminated or revoked sooner.    Influenza A by PCR NEGATIVE NEGATIVE Final   Influenza B by PCR NEGATIVE NEGATIVE Final    Comment: (NOTE) The Xpert Xpress SARS-CoV-2/FLU/RSV assay is intended as an aid in  the diagnosis of influenza from Nasopharyngeal swab specimens and  should not be used as a sole basis for treatment. Nasal washings and  aspirates are unacceptable for Xpert Xpress SARS-CoV-2/FLU/RSV  testing. Fact Sheet for Patients: PinkCheek.be Fact Sheet for Healthcare Providers: GravelBags.it This test is not yet approved or cleared by the [place] FDA and  has been authorized for detection and/or diagnosis  of SARS-CoV-2 by  FDA under an Emergency Use Authorization (EUA). This EUA will remain  in effect (meaning this test can be used) for the duration of the  Covid-19 declaration under Section 564(b)(1) of the Act, 21  U.S.C. section 360bbb-3(b)(1), unless the authorization is  terminated or revoked. Performed at [facility], [address] [place], [place] [address]     Radiology Reports DG Chest 2 View  Result Date: 12/01/2019 CLINICAL DATA:  Shortness of breath EXAM: CHEST - 2 VIEW COMPARISON:  CT 11/24/2018 FINDINGS: Heart and mediastinal contours are within normal limits. No focal opacities or effusions. No acute bony abnormality. IMPRESSION: No active cardiopulmonary disease. Electronically Signed   By: Rolm Baptise M.D.   On: 12/01/2019 01:38     CBC Recent Labs  Lab 11/30/19 1236 12/01/19 0522 12/02/19 0612  WBC 5.6 5.6 4.3  HGB 8.8* 7.8* 7.6*  HCT 28.4* 25.4* 23.9*  PLT 178  155 164  MCV 87.4 87.0 86.3  MCH 27.1 26.7 27.4  MCHC 31.0 30.7 31.8  RDW 13.2 13.2 13.1  LYMPHSABS 1.1  --  1.2  MONOABS 0.6  --  0.5  EOSABS 0.2  --  0.2  BASOSABS 0.0  --  0.0    Chemistries  Recent Labs  Lab 11/30/19 1236 12/01/19 0134 12/01/19 0522 12/02/19 0612  NA 139 138 138 140  K 4.8 4.2 4.0 4.1  CL 109 108 110 109  CO2 20* 19* 18* 20*  GLUCOSE 175* 190* 186* 97  BUN 74* 77* 79* 85*  CREATININE 7.46* 7.71* 8.05* 8.04*  CALCIUM 8.3* 7.8* 7.6* 8.1*   ------------------------------------------------------------------------------------------------------------------ No results for input(s): CHOL, HDL, LDLCALC, TRIG, CHOLHDL, LDLDIRECT in the last 72 hours.  Lab Results  Component Value Date   HGBA1C 8.8 (H) 11/30/2019   ------------------------------------------------------------------------------------------------------------------ No results for input(s): TSH, T4TOTAL, T3FREE, THYROIDAB in the last 72 hours.  Invalid input(s): FREET3 ------------------------------------------------------------------------------------------------------------------ Recent Labs    12/02/19 0612  FERRITIN 343*  TIBC 190*  IRON 36*    Coagulation profile Recent Labs  Lab 12/02/19 0740  INR 1.1    No results for input(s): DDIMER in the last 72 hours.  Cardiac Enzymes No results for input(s): CKMB, TROPONINI, MYOGLOBIN in the last 168 hours.  Invalid input(s): CK ------------------------------------------------------------------------------------------------------------------    Component Value Date/Time   BNP 212.0 (H) 11/30/2019 1236     Roxan Hockey M.D on 12/03/2019 at 4:58 PM  Go to [email or web address] - for contact info  [facility] - Office  [phone]

## 2019-12-03 NOTE — Progress Notes (Signed)
Hypoglycemic Event  CBG: 59  Treatment: 4 oz juice/soda  Symptoms: Shaky   Follow-up CBG: Time:0820 CBG Result:137  Possible Reasons for Event: Unknown  Comments/MD notified: Dr Joesph Fillers notified.      Randy Oneal

## 2019-12-04 LAB — RENAL FUNCTION PANEL
Albumin: 2.6 g/dL — ABNORMAL LOW (ref 3.5–5.0)
Anion gap: 11 (ref 5–15)
BUN: 82 mg/dL — ABNORMAL HIGH (ref 6–20)
CO2: 23 mmol/L (ref 22–32)
Calcium: 8.5 mg/dL — ABNORMAL LOW (ref 8.9–10.3)
Chloride: 106 mmol/L (ref 98–111)
Creatinine, Ser: 8.5 mg/dL — ABNORMAL HIGH (ref 0.61–1.24)
GFR calc Af Amer: 7 mL/min — ABNORMAL LOW (ref >60)
GFR calc non Af Amer: 6 mL/min — ABNORMAL LOW (ref >60)
Glucose, Bld: 123 mg/dL — ABNORMAL HIGH (ref 70–99)
Phosphorus: 6.3 mg/dL — ABNORMAL HIGH (ref 2.5–4.6)
Potassium: 3.8 mmol/L (ref 3.5–5.1)
Sodium: 140 mmol/L (ref 135–145)

## 2019-12-04 LAB — GLUCOSE, CAPILLARY
Glucose-Capillary: 104 mg/dL — ABNORMAL HIGH (ref 70–99)
Glucose-Capillary: 125 mg/dL — ABNORMAL HIGH (ref 70–99)
Glucose-Capillary: 183 mg/dL — ABNORMAL HIGH (ref 70–99)
Glucose-Capillary: 212 mg/dL — ABNORMAL HIGH (ref 70–99)

## 2019-12-04 NOTE — Progress Notes (Signed)
Patient scheduled for permacath placement by Dr. Constance Haw tomorrow.  Orders have been released.  Patient seen by myself and any questions he had were answered.

## 2019-12-04 NOTE — Progress Notes (Signed)
Patient Demographics:    Randy Oneal, is a 54 y.o. male, DOB - 1966/04/02, ZJI:[id number]  Admit date - 12/01/2019   Admitting Physician Lynetta Mare, MD  Outpatient Primary MD for the patient is [name], MD  LOS - 3  Chief Complaint  Patient presents with  . Abnormal Lab       Subjective:    Randy Oneal today has no fevers, no emesis,  No chest pain, --voiding well,  -Resting comfortably, no new concerns  Assessment  & Plan :    Principal Problem:   Acute on chronic renal failure (HCC) Active Problems:   Type 2 diabetes mellitus not at goal [facility])   HTN (hypertension), benign   Hyperlipidemia associated with type 2 diabetes mellitus ([place])   Diabetic neuropathy (HCC)   ESRD needing dialysis [facility])  Brief Summary:- 53 y.o.malewith medical history significant ofhypertension, diabetes mellitus type 2, retinopathy, neuropathy, chronic kidney disease admitted on 12/01/2019 with dyspnea on exertion and lower extremity edema found to have volume overload status  A/p 1)Aki on CKD IV--- progression towards ESRD --Discussed with nephrology and general surgery service plan is for IV diuresis with Lasix 80 mg IV 3 times daily at this time pending temporary HD catheter placement and initiation of hemodialysis on Monday, 12/05/2019 --Patient will need clipping process after that prior to discharge home -Continues to void well  2)HTN--- stable, continue amlodipine 10 mg daily, metoprolol 25 mg twice daily , hydralazine 50 mg 3 times daily, and IV Lasix  3)Anemia of CKD--hemoglobin 7.8--- Feraheme per nephrologist on 12/02/2019 -Defer decision on ESA/Epogen to nephrology team  4)DM2--A1c is 8.8 reflecting uncontrolled DM PTA, Use Novolog/Humalog Sliding scale insulin with Accu-Cheks/Fingersticks as ordered -Episodes of hypoglycemia, query insulin stacking due to worsening renal function and  reduce caloric intake in the hospital decrease Lantus to 20 units nightly  Disposition/Need for in-Hospital Stay- patient unable to be discharged at this time due to --- need IV diuresis and hemodialysis initiation* -Patient From: home D/C Place: home Barriers: Not Clinically Stable- --- patient is new to hemodialysis and will need clipping process and outpatient hemodialysis spot/chair prior to discharge  Code Status : full  Family Communication:   Son and wife -- (patient is alert, awake and coherent)  Consults  :  Nephrology/Gen Surgery  DVT Prophylaxis  :    - SCDs (Heparin on hold for [facility] placement)  Lab Results  Component Value Date   PLT 164 12/02/2019   Inpatient Medications  Scheduled Meds: . amLODipine  10 mg Oral Daily  . atorvastatin  10 mg Oral Daily  . calcitRIOL  0.25 mcg Oral Once per day on Mon Wed Fri  . furosemide  80 mg Intravenous Q8H  . hydrALAZINE  50 mg Oral TID  . insulin aspart  0-15 Units Subcutaneous TID WC  . insulin aspart  0-5 Units Subcutaneous QHS  . insulin glargine  20 Units Subcutaneous QHS  . metoprolol tartrate  25 mg Oral BID  . pantoprazole  40 mg Oral Daily  . sodium bicarbonate  650 mg Oral BID  . sodium chloride flush  3 mL Intravenous Q12H   Continuous Infusions: . sodium chloride    . ferumoxytol 510 mg (12/02/19 1315)  PRN Meds:.sodium chloride, bisacodyl, famotidine, ondansetron **OR** ondansetron (ZOFRAN) IV, polyethylene glycol, simethicone, sodium chloride flush   Anti-infectives (From admission, onward)   None        Objective:   Vitals:   12/04/19 0546 12/04/19 0934 12/04/19 1325 12/04/19 1552  BP: (!) 149/77 (!) 162/89 (!) 144/90 135/88  Pulse: 75 75 75 75  Resp: 16  20   Temp: 98.7 F (37.1 C)  98 F (36.7 C)   TempSrc: Oral  Oral   SpO2: 97%  97%   Weight:      Height:        Wt Readings from Last 3 Encounters:  12/04/19 95.2 kg  11/30/19 105.8 kg  06/15/19 88.5 kg    Intake/Output Summary  (Last 24 hours) at 12/04/2019 1847 Last data filed at 12/04/2019 1616 Gross per 24 hour  Intake --  Output 1850 ml  Net -1850 ml   Physical Exam  Gen:- Awake Alert,  In no apparent distress  HEENT:- [place].AT, No sclera icterus , prominent eyeballs difficulty keeping eyes open (not new for him) Neck-Supple Neck,No JVD,.  Lungs-diminished in bases, no wheezing  CV- S1, S2 normal, regular  Abd-  +ve B.Sounds, Abd Soft, No tenderness,    Extremity/Skin:-2-3+ pitting edema--improving, pedal pulses present , TEDs on  Psych-affect is appropriate, oriented x3 Neuro-no new focal deficits, no tremors   Data Review:   Micro Results Recent Results (from the past 240 hour(s))  Respiratory Panel by RT PCR (Flu A&B, Covid) - Nasopharyngeal Swab     Status: None   Collection Time: 12/01/19  2:31 AM   Specimen: Nasopharyngeal Swab  Result Value Ref Range Status   SARS Coronavirus 2 by RT PCR NEGATIVE NEGATIVE Final    Comment: (NOTE) SARS-CoV-2 target nucleic acids are NOT DETECTED. The SARS-CoV-2 RNA is generally detectable in upper respiratoy specimens during the acute phase of infection. The lowest concentration of SARS-CoV-2 viral copies this assay can detect is 131 copies/mL. A negative result does not preclude SARS-Cov-2 infection and should not be used as the sole basis for treatment or other patient management decisions. A negative result may occur with  improper specimen collection/handling, submission of specimen other than nasopharyngeal swab, presence of viral mutation(s) within the areas targeted by this assay, and inadequate number of viral copies (<131 copies/mL). A negative result must be combined with clinical observations, patient history, and epidemiological information. The expected result is Negative. Fact Sheet for Patients:  PinkCheek.be Fact Sheet for Healthcare Providers:  GravelBags.it This test is not yet ap  proved or cleared by the [place] FDA and  has been authorized for detection and/or diagnosis of SARS-CoV-2 by FDA under an Emergency Use Authorization (EUA). This EUA will remain  in effect (meaning this test can be used) for the duration of the COVID-19 declaration under Section 564(b)(1) of the Act, 21 U.S.C. section 360bbb-3(b)(1), unless the authorization is terminated or revoked sooner.    Influenza A by PCR NEGATIVE NEGATIVE Final   Influenza B by PCR NEGATIVE NEGATIVE Final    Comment: (NOTE) The Xpert Xpress SARS-CoV-2/FLU/RSV assay is intended as an aid in  the diagnosis of influenza from Nasopharyngeal swab specimens and  should not be used as a sole basis for treatment. Nasal washings and  aspirates are unacceptable for Xpert Xpress SARS-CoV-2/FLU/RSV  testing. Fact Sheet for Patients: PinkCheek.be Fact Sheet for Healthcare Providers: GravelBags.it This test is not yet approved or cleared by the [place] FDA and  has  been authorized for detection and/or diagnosis of SARS-CoV-2 by  FDA under an Emergency Use Authorization (EUA). This EUA will remain  in effect (meaning this test can be used) for the duration of the  Covid-19 declaration under Section 564(b)(1) of the Act, 21  U.S.C. section 360bbb-3(b)(1), unless the authorization is  terminated or revoked. Performed at [facility], [address] [place], [place] [address]     Radiology Reports DG Chest 2 View  Result Date: 12/01/2019 CLINICAL DATA:  Shortness of breath EXAM: CHEST - 2 VIEW COMPARISON:  CT 11/24/2018 FINDINGS: Heart and mediastinal contours are within normal limits. No focal opacities or effusions. No acute bony abnormality. IMPRESSION: No active cardiopulmonary disease. Electronically Signed   By: Rolm Baptise M.D.   On: 12/01/2019 01:38     CBC Recent Labs  Lab 11/30/19 1236 12/01/19 0522 12/02/19 0612  WBC 5.6 5.6 4.3  HGB  8.8* 7.8* 7.6*  HCT 28.4* 25.4* 23.9*  PLT 178 155 164  MCV 87.4 87.0 86.3  MCH 27.1 26.7 27.4  MCHC 31.0 30.7 31.8  RDW 13.2 13.2 13.1  LYMPHSABS 1.1  --  1.2  MONOABS 0.6  --  0.5  EOSABS 0.2  --  0.2  BASOSABS 0.0  --  0.0    Chemistries  Recent Labs  Lab 11/30/19 1236 12/01/19 0134 12/01/19 0522 12/02/19 0612 12/04/19 0628  NA 139 138 138 140 140  K 4.8 4.2 4.0 4.1 3.8  CL 109 108 110 109 106  CO2 20* 19* 18* 20* 23  GLUCOSE 175* 190* 186* 97 123*  BUN 74* 77* 79* 85* 82*  CREATININE 7.46* 7.71* 8.05* 8.04* 8.50*  CALCIUM 8.3* 7.8* 7.6* 8.1* 8.5*   ------------------------------------------------------------------------------------------------------------------ No results for input(s): CHOL, HDL, LDLCALC, TRIG, CHOLHDL, LDLDIRECT in the last 72 hours.  Lab Results  Component Value Date   HGBA1C 8.8 (H) 11/30/2019   ------------------------------------------------------------------------------------------------------------------ No results for input(s): TSH, T4TOTAL, T3FREE, THYROIDAB in the last 72 hours.  Invalid input(s): FREET3 ------------------------------------------------------------------------------------------------------------------ Recent Labs    12/02/19 0612  FERRITIN 343*  TIBC 190*  IRON 36*    Coagulation profile Recent Labs  Lab 12/02/19 0740  INR 1.1    No results for input(s): DDIMER in the last 72 hours.  Cardiac Enzymes No results for input(s): CKMB, TROPONINI, MYOGLOBIN in the last 168 hours.  Invalid input(s): CK ------------------------------------------------------------------------------------------------------------------    Component Value Date/Time   BNP 212.0 (H) 11/30/2019 1236     Roxan Hockey M.D on 12/04/2019 at 6:47 PM  Go to [email or web address] - for contact info  [facility] - Office  [phone]

## 2019-12-04 NOTE — Progress Notes (Signed)
Chart reviewed Pt for [facility] tomorrow with Dr. Constance Haw Then to start HD ORders written BP stable.  Making urine Labs show severe renal failure, K and HCO3 ok Will see tomorrow.

## 2019-12-05 ENCOUNTER — Inpatient Hospital Stay (HOSPITAL_COMMUNITY): Payer: Self-pay | Admitting: Anesthesiology

## 2019-12-05 ENCOUNTER — Inpatient Hospital Stay (HOSPITAL_COMMUNITY): Payer: Self-pay

## 2019-12-05 ENCOUNTER — Encounter (HOSPITAL_COMMUNITY): Payer: Self-pay | Admitting: Internal Medicine

## 2019-12-05 ENCOUNTER — Encounter (HOSPITAL_COMMUNITY)
Admission: EM | Disposition: A | Discharge status: Home / Self Care | Payer: Self-pay | Source: Home / Self Care | Attending: Family Medicine

## 2019-12-05 HISTORY — PX: INSERTION OF DIALYSIS CATHETER: SHX1324

## 2019-12-05 LAB — CBC
HCT: 26.3 % — ABNORMAL LOW (ref 39.0–52.0)
Hemoglobin: 8.3 g/dL — ABNORMAL LOW (ref 13.0–17.0)
MCH: 26.9 pg (ref 26.0–34.0)
MCHC: 31.6 g/dL (ref 30.0–36.0)
MCV: 85.1 fL (ref 80.0–100.0)
Platelets: 176 10*3/uL (ref 150–400)
RBC: 3.09 MIL/uL — ABNORMAL LOW (ref 4.22–5.81)
RDW: 13 % (ref 11.5–15.5)
WBC: 3.6 10*3/uL — ABNORMAL LOW (ref 4.0–10.5)
nRBC: 0 % (ref 0.0–0.2)

## 2019-12-05 LAB — BASIC METABOLIC PANEL
Anion gap: 11 (ref 5–15)
BUN: 85 mg/dL — ABNORMAL HIGH (ref 6–20)
CO2: 24 mmol/L (ref 22–32)
Calcium: 8.3 mg/dL — ABNORMAL LOW (ref 8.9–10.3)
Chloride: 105 mmol/L (ref 98–111)
Creatinine, Ser: 8.92 mg/dL — ABNORMAL HIGH (ref 0.61–1.24)
GFR calc Af Amer: 7 mL/min — ABNORMAL LOW (ref >60)
GFR calc non Af Amer: 6 mL/min — ABNORMAL LOW (ref >60)
Glucose, Bld: 103 mg/dL — ABNORMAL HIGH (ref 70–99)
Potassium: 3.8 mmol/L (ref 3.5–5.1)
Sodium: 140 mmol/L (ref 135–145)

## 2019-12-05 LAB — GLUCOSE, CAPILLARY
Glucose-Capillary: 137 mg/dL — ABNORMAL HIGH (ref 70–99)
Glucose-Capillary: 66 mg/dL — ABNORMAL LOW (ref 70–99)
Glucose-Capillary: 87 mg/dL (ref 70–99)
Glucose-Capillary: 87 mg/dL (ref 70–99)
Glucose-Capillary: 97 mg/dL (ref 70–99)
Glucose-Capillary: 235 mg/dL — ABNORMAL HIGH (ref 70–99)

## 2019-12-05 LAB — HEPATITIS B CORE ANTIBODY, TOTAL: Hep B Core Total Ab: NONREACTIVE

## 2019-12-05 LAB — HEPATITIS B SURFACE ANTIGEN: Hepatitis B Surface Ag: NONREACTIVE

## 2019-12-05 SURGERY — INSERTION OF DIALYSIS CATHETER
Anesthesia: General | Laterality: Right

## 2019-12-05 MED ORDER — FENTANYL CITRATE (PF) 100 MCG/2ML IJ SOLN
INTRAMUSCULAR | Status: DC | PRN
  Administered 2019-12-05: 50 ug via INTRAVENOUS

## 2019-12-05 MED ORDER — ALTEPLASE 2 MG IJ SOLR: 2.0000 mg | Freq: Once | INTRAMUSCULAR | Status: DC | PRN

## 2019-12-05 MED ORDER — MEPERIDINE HCL 50 MG/ML IJ SOLN: 6.2500 mg | INTRAMUSCULAR | Status: DC | PRN

## 2019-12-05 MED ORDER — CEFAZOLIN SODIUM-DEXTROSE 2-4 GM/100ML-% IV SOLN
INTRAVENOUS | Status: AC
  Filled 2019-12-05: qty 100

## 2019-12-05 MED ORDER — SODIUM CHLORIDE 0.9 % IV SOLN: 100.0000 mL | INTRAVENOUS | Status: DC | PRN

## 2019-12-05 MED ORDER — CEFAZOLIN SODIUM-DEXTROSE 2-4 GM/100ML-% IV SOLN
2.0000 g | INTRAVENOUS | Status: AC
  Administered 2019-12-05: 12:00:00 2 g via INTRAVENOUS

## 2019-12-05 MED ORDER — SODIUM CHLORIDE (PF) 0.9 % IJ SOLN
INTRAMUSCULAR | Status: DC | PRN
  Administered 2019-12-05: 15 mL via INTRAVENOUS

## 2019-12-05 MED ORDER — HYDROMORPHONE HCL 1 MG/ML IJ SOLN: 0.2500 mg | INTRAMUSCULAR | Status: DC | PRN

## 2019-12-05 MED ORDER — CHLORHEXIDINE GLUCONATE CLOTH 2 % EX PADS
6.0000 | MEDICATED_PAD | Freq: Every day | CUTANEOUS | Status: DC
  Administered 2019-12-05 – 2019-12-08 (×3): 6 via TOPICAL

## 2019-12-05 MED ORDER — HEPARIN SODIUM (PORCINE) 1000 UNIT/ML IJ SOLN
INTRAMUSCULAR | Status: AC
  Filled 2019-12-05: qty 4

## 2019-12-05 MED ORDER — HEPARIN 1000 UNIT/ML FOR PERITONEAL DIALYSIS
INTRAMUSCULAR | Status: DC | PRN
  Administered 2019-12-05: 4 mL via INTRAPERITONEAL

## 2019-12-05 MED ORDER — HEPARIN SODIUM (PORCINE) 1000 UNIT/ML DIALYSIS
1000.0000 [IU] | INTRAMUSCULAR | Status: DC | PRN
Start: 2019-12-05 — End: 2019-12-05

## 2019-12-05 MED ORDER — SEVOFLURANE IN SOLN
RESPIRATORY_TRACT | Status: AC
  Filled 2019-12-05: qty 250

## 2019-12-05 MED ORDER — HEPARIN SODIUM (PORCINE) 1000 UNIT/ML DIALYSIS
3800.0000 [IU] | INTRAMUSCULAR | Status: DC | PRN
  Administered 2019-12-05: 3800 [IU] via INTRAVENOUS_CENTRAL
  Filled 2019-12-05 (×2): qty 4

## 2019-12-05 MED ORDER — DARBEPOETIN ALFA 60 MCG/0.3ML IJ SOSY
60.0000 ug | PREFILLED_SYRINGE | INTRAMUSCULAR | Status: DC
  Administered 2019-12-05: 60 ug via INTRAVENOUS
  Filled 2019-12-05: qty 0.3

## 2019-12-05 MED ORDER — SODIUM CHLORIDE 0.9 % IV SOLN: Freq: Once | INTRAVENOUS | Status: DC

## 2019-12-05 MED ORDER — DEXTROSE 50 % IV SOLN
INTRAVENOUS | Status: AC
  Filled 2019-12-05: qty 50

## 2019-12-05 MED ORDER — LIDOCAINE HCL (PF) 1 % IJ SOLN
INTRAMUSCULAR | Status: DC | PRN
  Administered 2019-12-05: 7 mL via SUBCUTANEOUS

## 2019-12-05 MED ORDER — CHLORHEXIDINE GLUCONATE CLOTH 2 % EX PADS
6.0000 | MEDICATED_PAD | Freq: Once | CUTANEOUS | Status: AC
  Administered 2019-12-05: 6 via TOPICAL

## 2019-12-05 MED ORDER — ONDANSETRON HCL 4 MG/2ML IJ SOLN
INTRAMUSCULAR | Status: DC | PRN
  Administered 2019-12-05: 4 mg via INTRAVENOUS

## 2019-12-05 MED ORDER — PROPOFOL 10 MG/ML IV BOLUS
INTRAVENOUS | Status: DC | PRN
  Administered 2019-12-05: 150 mg via INTRAVENOUS
  Administered 2019-12-05: 50 mg via INTRAVENOUS

## 2019-12-05 MED ORDER — LIDOCAINE HCL (PF) 1 % IJ SOLN
INTRAMUSCULAR | Status: AC
  Filled 2019-12-05: qty 30

## 2019-12-05 MED ORDER — FENTANYL CITRATE (PF) 100 MCG/2ML IJ SOLN
INTRAMUSCULAR | Status: AC
  Filled 2019-12-05: qty 2

## 2019-12-05 MED ORDER — DEXTROSE 50 % IV SOLN
25.0000 mL | Freq: Once | INTRAVENOUS | Status: AC
  Administered 2019-12-05: 25 mL via INTRAVENOUS

## 2019-12-05 SURGICAL SUPPLY — 52 items
ADH SKN CLS APL DERMABOND .7 (GAUZE/BANDAGES/DRESSINGS) ×1
APL PRP STRL LF ISPRP CHG 10.5 (MISCELLANEOUS) ×1
APPLICATOR CHLORAPREP 10.5 ORG (MISCELLANEOUS) ×3 IMPLANT
BAG DECANTER FOR FLEXI CONT (MISCELLANEOUS) ×3 IMPLANT
BIOPATCH RED 1 DISK 7.0 (GAUZE/BANDAGES/DRESSINGS) ×2 IMPLANT
BIOPATCH RED 1IN DISK 7.0MM (GAUZE/BANDAGES/DRESSINGS) ×1
BIOPATCH WHT 1IN DISK W/4.0 H (GAUZE/BANDAGES/DRESSINGS) ×2 IMPLANT
CATH PALINDROME-P 19CM W/VT (CATHETERS) IMPLANT
CATH PALINDROME-P 23CM W/VT (CATHETERS) ×2 IMPLANT
COVER LIGHT HANDLE STERIS (MISCELLANEOUS) ×6 IMPLANT
COVER PROBE U/S 5X48 (MISCELLANEOUS) ×3 IMPLANT
COVER WAND RF STERILE (DRAPES) ×3 IMPLANT
DECANTER SPIKE VIAL GLASS SM (MISCELLANEOUS) ×6 IMPLANT
DERMABOND ADVANCED (GAUZE/BANDAGES/DRESSINGS) ×2
DERMABOND ADVANCED .7 DNX12 (GAUZE/BANDAGES/DRESSINGS) ×1 IMPLANT
DRAPE C-ARM FOLDED MOBILE STRL (DRAPES) ×3 IMPLANT
DRAPE CHEST BREAST 15X10 FENES (DRAPES) ×3 IMPLANT
DRSG SORBAVIEW 3.5X5-5/16 MED (GAUZE/BANDAGES/DRESSINGS) ×3 IMPLANT
ELECT REM PT RETURN 9FT ADLT (ELECTROSURGICAL) ×3
ELECTRODE REM PT RTRN 9FT ADLT (ELECTROSURGICAL) ×1 IMPLANT
GAUZE 4X4 16PLY RFD (DISPOSABLE) ×3 IMPLANT
GEL ULTRASOUND 20GR AQUASONIC (MISCELLANEOUS) ×3 IMPLANT
GLOVE BIO SURGEON STRL SZ 6.5 (GLOVE) ×2 IMPLANT
GLOVE BIO SURGEONS STRL SZ 6.5 (GLOVE) ×1
GLOVE BIOGEL PI IND STRL 6.5 (GLOVE) ×1 IMPLANT
GLOVE BIOGEL PI IND STRL 7.0 (GLOVE) ×2 IMPLANT
GLOVE BIOGEL PI INDICATOR 6.5 (GLOVE) ×2
GLOVE BIOGEL PI INDICATOR 7.0 (GLOVE) ×4
GOWN STRL REUS W/TWL LRG LVL3 (GOWN DISPOSABLE) ×6 IMPLANT
IV CONNECTOR ONE LINK NDLESS (IV SETS) IMPLANT
IV NS 500ML (IV SOLUTION) ×3
IV NS 500ML BAXH (IV SOLUTION) ×1 IMPLANT
KIT BLADEGUARD II DBL (SET/KITS/TRAYS/PACK) ×3 IMPLANT
KIT CATH PALINDROME CHRONIC (CATHETERS) IMPLANT
KIT TURNOVER KIT A (KITS) ×3 IMPLANT
MARKER SKIN DUAL TIP RULER LAB (MISCELLANEOUS) ×3 IMPLANT
NDL HYPO 18GX1.5 BLUNT FILL (NEEDLE) ×1 IMPLANT
NDL HYPO 25X1 1.5 SAFETY (NEEDLE) ×1 IMPLANT
NEEDLE HYPO 18GX1.5 BLUNT FILL (NEEDLE) ×3 IMPLANT
NEEDLE HYPO 25X1 1.5 SAFETY (NEEDLE) ×3 IMPLANT
PACK BASIC III (CUSTOM PROCEDURE TRAY) ×3
PACK SRG BSC III STRL LF ECLPS (CUSTOM PROCEDURE TRAY) ×1 IMPLANT
PAD ARMBOARD 7.5X6 YLW CONV (MISCELLANEOUS) ×3 IMPLANT
PENCIL SMOKE EVACUATOR COATED (MISCELLANEOUS) ×3 IMPLANT
SET BASIN LINEN APH (SET/KITS/TRAYS/PACK) ×3 IMPLANT
SUT MNCRL AB 4-0 PS2 18 (SUTURE) ×3 IMPLANT
SUT SILK 2 0 FSL 18 (SUTURE) ×3 IMPLANT
SUT VIC AB 3-0 SH 27 (SUTURE) ×3
SUT VIC AB 3-0 SH 27X BRD (SUTURE) ×1 IMPLANT
SYR 20ML LL LF (SYRINGE) ×3 IMPLANT
SYR 5ML LL (SYRINGE) ×3 IMPLANT
SYR CONTROL 10ML LL (SYRINGE) ×3 IMPLANT

## 2019-12-05 NOTE — Op Note (Signed)
Operative Note 12/05/19   Preoperative Diagnosis: End Stage Renal Disease    Postoperative Diagnosis: Same   Procedure(s) Performed: Tunneled Dialysis Catheter Placement, Right Internal Jugular    Surgeon: Lanell Matar. Constance Haw, MD   Assistants: No qualified resident was available   Anesthesia: General anesthesia   Anesthesiologist: Denese Killings, MD    Specimens: None   Estimated Blood Loss: Minimal   Fluoroscopy time: 24 seconds   Blood Replacement: None    Complications: None    Operative Findings: Normal anatomy  Indications: Mr. Boldman is a 54 yo with worsening renal failure and need for dialysis. He has been inpatient getting diuresis over the weekend. We discussed the risk of permacatheter placement including but not limited to bleeding, infection, pneumathorax, injury to the vessels, malfunction of the catheter, and he has opted to proceed.  Procedure: The patient was brought into the operating room and general anesthesia was induced.   The right chest and neck was prepped and draped in the usual sterile fashion.  Preoperative antibiotics were given.   An Ultrasound was used to verify that the right internal jugular vein was patent.  One percent lidocaine was used for local anesthesia.  The patient was measured and a 23 cm Palindrome dual lumen dialysis catheter.  The needles advanced into the right internal jugular vein using the Seldinger technique without difficulty.  A guidewire was then advanced into the right atrium under fluoroscopic guidance.  Ectopia was not noted.  The wire was secured.  An incision was made over the right chest and the catheter was tunneled to the neck.  The ultrasound again confirmed the wire was going into the vein only. Dilators were used over the wire to dilate the track.  An introducer and peel-away sheath were placed over the guidewire. The catheter was then inserted through the peel-away sheath and the peel-away sheath was removed.  A spot  film was performed to confirm the position.  The catheter drew back and flushed easily. The lumens were packed with heparin. Hemostats were used to position the catheter in the neck incision. The neck incision was closed with 4-0 Monocryl and Dermabond. The catheter was secured with 2-0 silk suture and a sterile Biopatch and dressing was applied.  Hemostasis was confirmed.     All tape and needle counts were correct at the end of the procedure. The patient was transferred to PACU in stable condition. A chest x-ray will be performed at that time.  Curlene Labrum, MD [facility] [address] [place], [place] [address] [phone] (office)

## 2019-12-05 NOTE — Plan of Care (Signed)
  Problem: Activity: Goal: Risk for activity intolerance will decrease Outcome: Adequate for Discharge   Problem: Coping: Goal: Level of anxiety will decrease Outcome: Adequate for Discharge   Problem: Elimination: Goal: Will not experience complications related to bowel motility Outcome: Adequate for Discharge   Problem: Pain Managment: Goal: General experience of comfort will improve Outcome: Adequate for Discharge

## 2019-12-05 NOTE — Transfer of Care (Signed)
Immediate Anesthesia Transfer of Care Note  Patient: Randy Oneal  Procedure(s) Performed: INSERTION OF DIALYSIS CATHETER RIGHT SUBCLAVIAN (Right )  Patient Location: PACU  Anesthesia Type:General  Level of Consciousness: awake, alert  and patient cooperative  Airway & Oxygen Therapy: Patient Spontanous Breathing  Post-op Assessment: Report given to RN and Post -op Vital signs reviewed and stable  Post vital signs: Reviewed and stable  Last Vitals:  Vitals Value Taken Time  BP    Temp    Pulse 81 12/05/19 1332  Resp 27 12/05/19 1332  SpO2 96 % 12/05/19 1332  Vitals shown include unvalidated device data.  Last Pain:  Vitals:   12/05/19 1053  TempSrc: Oral  PainSc:       Patients Stated Pain Goal: 6 (40/37/09 6438)  Complications: No apparent anesthesia complications  SEE PACU FLOW SHEET FOR VITAL SIGNS

## 2019-12-05 NOTE — Progress Notes (Signed)
[facility]  CXR without pneumothorax, in the SVC, is a little shorter than would have expected. Drew back well. Will see how dialysis runs. Notified dialysis RN.  Curlene Labrum, MD [facility] [address] [place], [place] [address] [phone] (office)

## 2019-12-05 NOTE — Interval H&P Note (Signed)
History and Physical Interval Note:  12/05/2019 11:20 AM  Randy Oneal  has presented today for surgery, with the diagnosis of end stage renal disease.  The various methods of treatment have been discussed with the patient and family. After consideration of risks, benefits and other options for treatment, the patient has consented to  Procedure(s): INSERTION OF DIALYSIS CATHETER (Right) as a surgical intervention.  The patient's history has been reviewed, patient examined, no change in status, stable for surgery.  I have reviewed the patient's chart and labs.  Questions were answered to the patient's satisfaction.    No changes.  Virl Cagey

## 2019-12-05 NOTE — Anesthesia Procedure Notes (Signed)
Procedure Name: LMA Insertion Date/Time: 12/05/2019 12:39 PM Performed by: Vista Deck, CRNA Pre-anesthesia Checklist: Patient identified, Patient being monitored, Emergency Drugs available, Timeout performed and Suction available Patient Re-evaluated:Patient Re-evaluated prior to induction Oxygen Delivery Method: Circle System Utilized Preoxygenation: Pre-oxygenation with 100% oxygen Induction Type: IV induction LMA: LMA inserted LMA Size: 4.0 Number of attempts: 1 Placement Confirmation: positive ETCO2 and breath sounds checked- equal and bilateral Tube secured with: Tape Dental Injury: Teeth and Oropharynx as per pre-operative assessment

## 2019-12-05 NOTE — Progress Notes (Signed)
  [facility] Progress Note    Assessment/ Plan:   1 AKI on CKD: Cr 4.4--> 7.8-->8.0-->8.9.  Followed with [name] at [facility].  Now is ESRD; stop diuretics tomorrow.  HD#1 today: 4K, 1L UF, 250/500, 2.5h; CLIP 2 DM II: per primary 3 Hypertension: on metop and amlodipine; stable 4. Anemia of ESRD: Hbg 8.3, rec Fereheme 4/23, start ESA with HD today 5. 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  Dispo: CLIP today  Subjective:    Good UOP but SCr further inc over weekend For [facility] today with Dr. Constance Haw, then HD#1   Objective:   BP 137/86 (BP Location: Left Arm)   Pulse 77   Temp 98.4 F (36.9 C) (Oral)   Resp 18   Ht 6' (1.829 m)   Wt 98.2 kg   SpO2 99%   BMI 29.36 kg/m   Intake/Output Summary (Last 24 hours) at 12/05/2019 1048 Last data filed at 12/05/2019 0900 Gross per 24 hour  Intake -  Output 2750 ml  Net -2750 ml   Weight change: 3 kg  Physical Exam: GEN NAD HEENT NCAT PULM normal WOB clear bilaterally no c/w/r CV RRR II/VI systolic murmur ABD soft nontender NABS EXT 2+ tight LE edema, improving somewhat today NEURO AAO x 3 no asterixis SKIN warm and dry no rashes MSK no effusions Imaging: No results found.  Labs: BMET Recent Labs  Lab 11/30/19 1236 12/01/19 0134 12/01/19 0522 12/02/19 0612 12/04/19 0628 12/05/19 0522  NA 139 138 138 140 140 140  K 4.8 4.2 4.0 4.1 3.8 3.8  CL 109 108 110 109 106 105  CO2 20* 19* 18* 20* 23 24  GLUCOSE 175* 190* 186* 97 123* 103*  BUN 74* 77* 79* 85* 82* 85*  CREATININE 7.46* 7.71* 8.05* 8.04* 8.50* 8.92*  CALCIUM 8.3* 7.8* 7.6* 8.1* 8.5* 8.3*  PHOS  --   --   --  6.2* 6.3*  --    CBC Recent Labs  Lab 11/30/19 1236 12/01/19 0522 12/02/19 0612 12/05/19 0522  WBC 5.6 5.6 4.3 3.6*  NEUTROABS 3.8  --  2.4  --   HGB 8.8* 7.8* 7.6* 8.3*  HCT 28.4* 25.4* 23.9* 26.3*  MCV 87.4 87.0 86.3 85.1  PLT 178 155 164 176    Medications:    . [MAR Hold]  amLODipine  10 mg Oral Daily  . [MAR Hold] atorvastatin  10 mg Oral Daily  . [MAR Hold] calcitRIOL  0.25 mcg Oral Once per day on Mon Wed Fri  . [MAR Hold] furosemide  80 mg Intravenous Q8H  . [MAR Hold] hydrALAZINE  50 mg Oral TID  . [MAR Hold] insulin glargine  20 Units Subcutaneous QHS  . [MAR Hold] metoprolol tartrate  25 mg Oral BID  . [MAR Hold] pantoprazole  40 mg Oral Daily  . [MAR Hold] sodium bicarbonate  650 mg Oral BID  . [MAR Hold] sodium chloride flush  3 mL Intravenous Q12H      Rexene Agent  12/05/2019, 10:48 AM

## 2019-12-05 NOTE — Anesthesia Postprocedure Evaluation (Signed)
Anesthesia Post Note  Patient: Randy Oneal  Procedure(s) Performed: INSERTION OF DIALYSIS CATHETER RIGHT SUBCLAVIAN (Right )  Patient location during evaluation: PACU Anesthesia Type: General Level of consciousness: awake and alert and patient cooperative Pain management: satisfactory to patient Vital Signs Assessment: post-procedure vital signs reviewed and stable Respiratory status: spontaneous breathing Cardiovascular status: stable Postop Assessment: no apparent nausea or vomiting Anesthetic complications: no     Last Vitals:  Vitals:   12/05/19 1053 12/05/19 1330  BP: (!) 189/106 (!) 154/85  Pulse: 75 87  Resp: 15 15  Temp: 36.7 C 36.7 C  SpO2: 96% 96%    Last Pain:  Vitals:   12/05/19 1330  TempSrc:   PainSc: 0-No pain                 Damacio Weisgerber

## 2019-12-05 NOTE — Progress Notes (Signed)
[facility]   Notified family tunneled dialysis catheter in place. CXR pending. Dialysis can start.   Follow up with nephrology.  Does not need to follow up with me.   Curlene Labrum, MD [facility] [address] [place], [place] [address] [phone] (office)

## 2019-12-05 NOTE — Progress Notes (Signed)
Patient Demographics:    Randy Oneal, is a 54 y.o. male, DOB - 11/06/1965, KDX:[id number]  Admit date - 12/01/2019   Admitting Physician Lynetta Mare, MD  Outpatient Primary MD for the patient is [name], MD  LOS - 4  Chief Complaint  Patient presents with  . Abnormal Lab       Subjective:    [name] today has no fevers, no emesis,  No chest pain, --voiding well,  -Had episodes of hypoglycemia due to n.p.o. status for procedure  Assessment  & Plan :    Principal Problem:   Acute on chronic renal failure (HCC) Active Problems:   Type 2 diabetes mellitus not at goal [facility])   HTN (hypertension), benign   Hyperlipidemia associated with type 2 diabetes mellitus ([place])   Diabetic neuropathy (HCC)   ESRD needing dialysis [facility])  Brief Summary:- 54 y.o.malewith medical history significant ofhypertension, diabetes mellitus type 2, retinopathy, neuropathy, chronic kidney disease admitted on 12/01/2019 with dyspnea on exertion and lower extremity edema found to have volume overload status  A/p 1)Aki on CKD IV--- progression towards ESRD --Discussed with nephrology and general surgery service -- s/p  temporary HD catheter placement and initiation of hemodialysis on Monday, 12/05/2019 - clipping process has begun -Continues to void well  2)HTN--- stable, continue amlodipine 10 mg daily, metoprolol 25 mg twice daily , hydralazine 50 mg 3 times daily, and IV Lasix  3)Anemia of CKD--hemoglobin 7.8--- Feraheme per nephrologist on 12/02/2019 -Defer decision on ESA/Epogen to nephrology team  4)DM2--A1c is 8.8 reflecting uncontrolled DM PTA, Use Novolog/Humalog Sliding scale insulin with Accu-Cheks/Fingersticks as ordered -Episodes of hypoglycemia, query insulin stacking due to worsening renal function and reduce caloric intake in the hospital decrease Lantus to 20 units  nightly  Disposition/Need for in-Hospital Stay- patient unable to be discharged at this time due to --- need IV diuresis and hemodialysis initiation* -Patient From: home D/C Place: home Barriers: Not Clinically Stable- --- patient is new to hemodialysis and will need clipping process and outpatient hemodialysis spot/chair prior to discharge  Code Status : full   Procedures:- s/p  temporary HD catheter placement and initiation of hemodialysis on Monday, 12/05/2019  Family Communication:   Son and wife -- (patient is alert, awake and coherent)  Consults  :  Nephrology/Gen Surgery  DVT Prophylaxis  :    - SCDs (Heparin on hold for [facility] placement)  Lab Results  Component Value Date   PLT 176 12/05/2019   Inpatient Medications  Scheduled Meds: . amLODipine  10 mg Oral Daily  . atorvastatin  10 mg Oral Daily  . calcitRIOL  0.25 mcg Oral Once per day on Mon Wed Fri  . darbepoetin (ARANESP) injection - DIALYSIS  60 mcg Intravenous Q Mon-HD  . furosemide  80 mg Intravenous Q8H  . hydrALAZINE  50 mg Oral TID  . insulin glargine  20 Units Subcutaneous QHS  . metoprolol tartrate  25 mg Oral BID  . pantoprazole  40 mg Oral Daily  . sodium bicarbonate  650 mg Oral BID  . sodium chloride flush  3 mL Intravenous Q12H   Continuous Infusions: . sodium chloride    . sodium chloride    . sodium chloride    .  ferumoxytol 510 mg (12/02/19 1315)   PRN Meds:.sodium chloride, sodium chloride, sodium chloride, alteplase, bisacodyl, famotidine, heparin, ondansetron **OR** ondansetron (ZOFRAN) IV, polyethylene glycol, simethicone, sodium chloride flush   Anti-infectives (From admission, onward)   Start     Dose/Rate Route Frequency Ordered Stop   12/05/19 1100  ceFAZolin (ANCEF) IVPB 2g/100 mL premix     2 g 200 mL/hr over 30 Minutes Intravenous On call to O.R. 12/05/19 1055 12/05/19 1250        Objective:   Vitals:   12/05/19 1700 12/05/19 1715 12/05/19 1730 12/05/19 1745  BP: (!)  174/93 (!) 156/90 (!) 160/90 (!) 152/91  Pulse: 78 80 81 82  Resp:      Temp:      TempSrc:      SpO2:      Weight:      Height:        Wt Readings from Last 3 Encounters:  12/05/19 98.5 kg  11/30/19 105.8 kg  06/15/19 88.5 kg    Intake/Output Summary (Last 24 hours) at 12/05/2019 1758 Last data filed at 12/05/2019 1530 Gross per 24 hour  Intake 325 ml  Output 3025 ml  Net -2700 ml   Physical Exam  Gen:- Awake Alert,  In no apparent distress  HEENT:- [place].AT, No sclera icterus , prominent eyeballs difficulty keeping eyes open (not new for him) Neck-Supple Neck,No JVD,.  Lungs-diminished in bases, no wheezing  CV- S1, S2 normal, regular  Abd-  +ve B.Sounds, Abd Soft, No tenderness,    Extremity/Skin:-2-3+ pitting edema--improving, pedal pulses present , TEDs on  Psych-affect is appropriate, oriented x3 Neuro-no new focal deficits, no tremors   Data Review:   Micro Results Recent Results (from the past 240 hour(s))  Respiratory Panel by RT PCR (Flu A&B, Covid) - Nasopharyngeal Swab     Status: None   Collection Time: 12/01/19  2:31 AM   Specimen: Nasopharyngeal Swab  Result Value Ref Range Status   SARS Coronavirus 2 by RT PCR NEGATIVE NEGATIVE Final    Comment: (NOTE) SARS-CoV-2 target nucleic acids are NOT DETECTED. The SARS-CoV-2 RNA is generally detectable in upper respiratoy specimens during the acute phase of infection. The lowest concentration of SARS-CoV-2 viral copies this assay can detect is 131 copies/mL. A negative result does not preclude SARS-Cov-2 infection and should not be used as the sole basis for treatment or other patient management decisions. A negative result may occur with  improper specimen collection/handling, submission of specimen other than nasopharyngeal swab, presence of viral mutation(s) within the areas targeted by this assay, and inadequate number of viral copies (<131 copies/mL). A negative result must be combined with  clinical observations, patient history, and epidemiological information. The expected result is Negative. Fact Sheet for Patients:  PinkCheek.be Fact Sheet for Healthcare Providers:  GravelBags.it This test is not yet ap proved or cleared by the [place] FDA and  has been authorized for detection and/or diagnosis of SARS-CoV-2 by FDA under an Emergency Use Authorization (EUA). This EUA will remain  in effect (meaning this test can be used) for the duration of the COVID-19 declaration under Section 564(b)(1) of the Act, 21 U.S.C. section 360bbb-3(b)(1), unless the authorization is terminated or revoked sooner.    Influenza A by PCR NEGATIVE NEGATIVE Final   Influenza B by PCR NEGATIVE NEGATIVE Final    Comment: (NOTE) The Xpert Xpress SARS-CoV-2/FLU/RSV assay is intended as an aid in  the diagnosis of influenza from Nasopharyngeal swab specimens and  should  not be used as a sole basis for treatment. Nasal washings and  aspirates are unacceptable for Xpert Xpress SARS-CoV-2/FLU/RSV  testing. Fact Sheet for Patients: PinkCheek.be Fact Sheet for Healthcare Providers: GravelBags.it This test is not yet approved or cleared by the [place] FDA and  has been authorized for detection and/or diagnosis of SARS-CoV-2 by  FDA under an Emergency Use Authorization (EUA). This EUA will remain  in effect (meaning this test can be used) for the duration of the  Covid-19 declaration under Section 564(b)(1) of the Act, 21  U.S.C. section 360bbb-3(b)(1), unless the authorization is  terminated or revoked. Performed at [facility], [address] [place], [place] [address]     Radiology Reports DG Chest 2 View  Result Date: 12/01/2019 CLINICAL DATA:  Shortness of breath EXAM: CHEST - 2 VIEW COMPARISON:  CT 11/24/2018 FINDINGS: Heart and mediastinal contours are within normal  limits. No focal opacities or effusions. No acute bony abnormality. IMPRESSION: No active cardiopulmonary disease. Electronically Signed   By: Rolm Baptise M.D.   On: 12/01/2019 01:38   DG Chest Port 1 View  Result Date: 12/05/2019 CLINICAL DATA:  Dialysis catheter placement. EXAM: PORTABLE CHEST 1 VIEW COMPARISON:  Earlier same day FINDINGS: Right internal jugular catheter has been placed. The tip is in the SVC at the azygos level, approximately 5 cm above the SVC RA junction. No pneumothorax. Heart size is normal. The lungs are clear. No edema. IMPRESSION: Right internal jugular central line tip in the SVC at the azygos level, 5 cm above the SVC RA junction. No pneumothorax. Electronically Signed   By: Nelson Chimes M.D.   On: 12/05/2019 13:42   DG C-Arm 1-60 Min-No Report  Result Date: 12/05/2019 Fluoroscopy was utilized by the requesting physician.  No radiographic interpretation.     CBC Recent Labs  Lab 11/30/19 1236 12/01/19 0522 12/02/19 0612 12/05/19 0522  WBC 5.6 5.6 4.3 3.6*  HGB 8.8* 7.8* 7.6* 8.3*  HCT 28.4* 25.4* 23.9* 26.3*  PLT 178 155 164 176  MCV 87.4 87.0 86.3 85.1  MCH 27.1 26.7 27.4 26.9  MCHC 31.0 30.7 31.8 31.6  RDW 13.2 13.2 13.1 13.0  LYMPHSABS 1.1  --  1.2  --   MONOABS 0.6  --  0.5  --   EOSABS 0.2  --  0.2  --   BASOSABS 0.0  --  0.0  --     Chemistries  Recent Labs  Lab 12/01/19 0134 12/01/19 0522 12/02/19 0612 12/04/19 0628 12/05/19 0522  NA 138 138 140 140 140  K 4.2 4.0 4.1 3.8 3.8  CL 108 110 109 106 105  CO2 19* 18* 20* 23 24  GLUCOSE 190* 186* 97 123* 103*  BUN 77* 79* 85* 82* 85*  CREATININE 7.71* 8.05* 8.04* 8.50* 8.92*  CALCIUM 7.8* 7.6* 8.1* 8.5* 8.3*   ------------------------------------------------------------------------------------------------------------------ No results for input(s): CHOL, HDL, LDLCALC, TRIG, CHOLHDL, LDLDIRECT in the last 72 hours.  Lab Results  Component Value Date   HGBA1C 8.8 (H) 11/30/2019    ------------------------------------------------------------------------------------------------------------------ No results for input(s): TSH, T4TOTAL, T3FREE, THYROIDAB in the last 72 hours.  Invalid input(s): FREET3 ------------------------------------------------------------------------------------------------------------------ No results for input(s): VITAMINB12, FOLATE, FERRITIN, TIBC, IRON, RETICCTPCT in the last 72 hours.  Coagulation profile Recent Labs  Lab 12/02/19 0740  INR 1.1    No results for input(s): DDIMER in the last 72 hours.  Cardiac Enzymes No results for input(s): CKMB, TROPONINI, MYOGLOBIN in the last 168 hours.  Invalid input(s): CK ------------------------------------------------------------------------------------------------------------------  Component Value Date/Time   BNP 212.0 (H) 11/30/2019 1236     Roxan Hockey M.D on 12/05/2019 at 5:58 PM  Go to [email or web address] - for contact info  [facility] - Office  [phone]

## 2019-12-05 NOTE — TOC Progression Note (Signed)
Transition of Care [facility]) - Progression Note    Patient Details  Name: Grayton Lobo MRN: [id number] Date of Birth: 1966/04/26  Transition of [facility]) CM/SW Contact  Shade Flood, LCSW Phone Number: 12/05/2019, 4:27 PM  Clinical Narrative:     TOC following. Spoke with [facility] intake to inquire on status of referral. Was informed that they do not need any additional clinical at this time. They are waiting on financial clearance for pt and will then assign chair time.  TOC will follow.   Expected Discharge Plan: Home/Self Care Barriers to Discharge: Continued Medical Work up, Waiting for outpatient dialysis  Expected Discharge Plan and Services Expected Discharge Plan: Home/Self Care       Living arrangements for the past 2 months: Single Family Home                                       Social Determinants of Health (SDOH) Interventions    Readmission Risk Interventions No flowsheet data found.

## 2019-12-05 NOTE — Procedures (Signed)
     INITIAL HEMODIALYSIS TREATMENT NOTE:  Indications / risks / benefits of renal replacement therapy were discussed with patient prior to obtaining consent for first ever hemodialysis treatment 12/05/19.  Pt is s/p new RIJ TDC placement by Dr. Blake Divine. Dressing is intact and without drainage. Catheter tolerated prescribed blood flow with stable pressures.  2.5 hour treatment completed without problems.  Goal met: 1 liter removed without interruption in UF.  Aranesp 31mg given with this treatment.   HBsAG, anti-HBs, and HB Core Ab/total were collected pre-HD - in process.  No changes from pre-treatment assessment.   ARockwell Alexandria RN

## 2019-12-05 NOTE — Discharge Instructions (Signed)
Vascular Access for Hemodialysis        A vascular access is a connection to the blood inside your blood vessels that allows blood to be easily removed from your body and returned to your body during kidney dialysis (hemodialysis). Hemodialysis is a procedure in which a machine outside of the body filters the blood of a person whose kidneys are no longer working properly. There are three types of vascular accesses:  Arteriovenous fistula (AVF). This is a connection between an artery and a vein (usually in the arm) that is made by sewing them together. Blood in the artery flows directly into the vein, causing it to get larger over time. This makes it easier for the vein to be used for hemodialysis. An arteriovenous fistula takes 1-6 months to develop after surgery.  Arteriovenous graft (AVG). This is a connection between an artery and a vein in the arm that is made with a tube. An arteriovenous graft can be used within 2-3 weeks of surgery.  Venous catheter. This is a thin, flexible tube that is placed in a large vein (usually in the neck, chest, or groin). A venous catheter for hemodialysis contains two tubes that come out of the skin. A venous catheter can be used right away. It is usually used as a temporary access if you need hemodialysis before a fistula or graft has developed, or if kidney failure is sudden (acute) and likely to improve without the need for long-term dialysis. It may also be used as a permanent access if a fistula or graft cannot be created. Which type of access is best for me? The type of access that is best for you depends on the size and strength of your veins, your age, and any other health problems that you have, such as diabetes. An ultrasound test may be used to look at your veins to help make this decision. A fistula is usually the preferred type of access. It can last several years and is less likely than the other types of accesses to become infected or to cause a blood  clot within a blood vessel (thrombosis). However, a fistula is not an option for everyone. If your veins are not the right size or if the fistula does not develop properly, a graft may be used instead. Grafts require you to have strong veins. If your veins are not strong enough for a graft, a catheter may be used. Catheters are more likely than fistulas and grafts to become infected or to have a thrombosis. Sometimes, only one type of access is an option. Your health care provider will help you determine which type of access is best for you. How is a vascular access used? The way that the access is used depends on the type of access:  If the access is a fistula or graft, two needles are inserted through the skin into the access before each hemodialysis session. Blood leaves the body through one of the needles and travels through a tube to the hemodialysis machine (dialyzer). Then it flows through another tube and returns to the body through the second needle.  If the access is a catheter, one tube is connected directly to the tube that leads to the dialyzer, and the other tube is connected to a tube that leads away from the dialyzer. Blood leaves the body through one tube and returns to the body through the other. What problems can occur with vascular access?  A blood clot within a blood vessel (thrombosis).  Thrombosis can lead to a narrowing of a blood vessel (stenosis). If thrombosis occurs frequently, another access site may be created as a backup.  Infection.  Heart enlargement (cardiomegaly) and heart failure. Changes in blood flow may cause an increase in blood pressure or heart rate, making your heart work harder to pump blood. These problems are most likely to occur with a venous catheter and least likely to occur with an arteriovenous fistula. How do I care for my vascular access?  Wear a medical alert bracelet. In case of an emergency, this bracelet tells health care providers that you  are a dialysis patient and allows them to care for your veins appropriately. If you have a graft or fistula:  A "bruit" is a noise that is heard with a stethoscope, and a "thrill" is a vibration that is felt over the graft or fistula. The presence of the bruit and thrill indicates that the access is working. You will be taught to feel for the thrill each day. If this is not felt, the access may be clotted. Call your health care provider.  Keep your arm straight and raised (elevated) above your heart while the access site is healing.  You may freely use the arm where your vascular access is located after the site heals. Keep the following in mind: ? Avoid pressure on the arm. ? Avoid lifting heavy objects with the arm. ? Avoid sleeping on the arm. ? Avoid wearing tight-sleeved shirts or jewelry around the graft or fistula.  Do not allow blood pressure monitoring or needle punctures on the side where the graft or fistula is located.  With permission from your health care provider, you may do exercises to help with blood flow through a fistula. These exercises involve squeezing a rubber ball or other soft objects as instructed.  Wash your access site according to directions from your health care provider. If you have a venous catheter:  Keep the insertion site clean and dry at all times.  If you are told you can shower after the site heals, use a protective covering over the catheter to keep it dry.  Follow directions from your health care provider for bandage (dressing) changes.  Ask your health care provider what activities are safe for you. You may be restricted from lifting or making repetitive arm movements on the side with the catheter. Contact a health care provider if:  Swelling around the graft or fistula gets worse.  You develop new pain.  Your catheter gets damaged. Get help right away if:  You have pain, numbness, an unusual pale skin color, or blue fingers or sores at  the tips of your fingers in the hand on the side of your fistula.  You have chills.  You have a fever.  You have pus or other fluid (drainage) at the vascular access site.  You develop skin redness or red streaking on the skin around, above, or below the vascular access.  You have bleeding at the vascular access that cannot be easily controlled.  Your catheter gets pulled out of place.  You feel your heart racing or skipping beats.  You have chest pain. Summary  A vascular access is a connection to the blood inside your blood vessels that allows blood to be easily removed from your body and returned to your body during kidney dialysis (hemodialysis).  There are three types of vascular accesses.The type of access that is best for you depends on the size and strength of your  veins, your age, and any other health problems that you have, such as diabetes.  A fistula is usually the preferred type of access, although it is not an option for everyone. It can last several years and is less likely than the other types of accesses to become infected or to cause a blood clot within a blood vessel (thrombosis).  Wear a medical alert bracelet. In case of an emergency, this tells health care providers that you are a dialysis patient. This information is not intended to replace advice given to you by your health care provider. Make sure you discuss any questions you have with your health care provider. Document Revised: 11/16/2018 Document Reviewed: 08/22/2016 Elsevier Patient Education  [place].

## 2019-12-05 NOTE — Anesthesia Preprocedure Evaluation (Signed)
Anesthesia Evaluation  Patient identified by MRN, date of birth, ID band Patient awake    Reviewed: Allergy & Precautions, NPO status , Patient's Chart, lab work & pertinent test results  Airway Mallampati: III  TM Distance: >3 FB Neck ROM: Full    Dental  (+) Teeth Intact, Dental Advisory Given   Pulmonary neg pulmonary ROS,    Pulmonary exam normal breath sounds clear to auscultation       Cardiovascular hypertension, Pt. on medications Normal cardiovascular exam Rhythm:Regular Rate:Normal - Systolic murmurs, - Diastolic murmurs, - Friction Rub, - Carotid Bruit, - Peripheral Edema and - Systolic Click [id number] 46:80:32 [facility]-ER ROUTINE RECORD Sinus rhythm Probable left atrial enlargement Peaked T waves, also present on last EKG 15 Feb 2019 No significant change since last tracing 15 Feb 2019 Confirmed by Rolland Porter [phone]) on 12/01/2019 1:35:05 AM   Neuro/Psych negative neurological ROS  negative psych ROS   GI/Hepatic negative GI ROS, Neg liver ROS,   Endo/Other  diabetes, Well Controlled, Type 2, Insulin Dependent  Renal/GU ESRFRenal disease     Musculoskeletal  (+) Arthritis ,   Abdominal   Peds  Hematology   Anesthesia Other Findings   Reproductive/Obstetrics                           Anesthesia Physical Anesthesia Plan  ASA: IV  Anesthesia Plan: General   Post-op Pain Management:    Induction: Intravenous  PONV Risk Score and Plan: 3 and Ondansetron and Midazolam  Airway Management Planned: LMA  Additional Equipment:   Intra-op Plan:   Post-operative Plan:   Informed Consent:     Dental advisory given  Plan Discussed with: CRNA and Surgeon  Anesthesia Plan Comments:         Anesthesia Quick Evaluation

## 2019-12-06 LAB — GLUCOSE, CAPILLARY
Glucose-Capillary: 104 mg/dL — ABNORMAL HIGH (ref 70–99)
Glucose-Capillary: 165 mg/dL — ABNORMAL HIGH (ref 70–99)
Glucose-Capillary: 207 mg/dL — ABNORMAL HIGH (ref 70–99)
Glucose-Capillary: 159 mg/dL — ABNORMAL HIGH (ref 70–99)

## 2019-12-06 MED ORDER — CHLORHEXIDINE GLUCONATE CLOTH 2 % EX PADS
6.0000 | MEDICATED_PAD | Freq: Every day | CUTANEOUS | Status: DC
  Administered 2019-12-07 – 2019-12-08 (×2): 6 via TOPICAL

## 2019-12-06 MED ORDER — HEPARIN SODIUM (PORCINE) 1000 UNIT/ML DIALYSIS
20.0000 [IU]/kg | INTRAMUSCULAR | Status: DC | PRN
  Filled 2019-12-06: qty 2

## 2019-12-06 NOTE — Progress Notes (Signed)
[facility] NEPHROLOGY PROGRESS NOTE  Assessment/ Plan: Pt is a 54 y.o. yo male with hypertension, diabetes, CKD followed by Dr. [name] at CKD now with new ESRD.  #Acute kidney injury on CKD progressed to new ESRD: Status post right IJ TDC placed by Dr. Constance Haw on 4/26 and has received first dialysis.  Tolerated HD well. Plan for second HD today.  Consult SW for outpatient HD arrangement probably in [facility] unit in [place]. Need vascular referral for permanent access as outpatient. -Discontinue oral sodium bicarbonate  # Anemia of CKD: Treated with IV iron.  Continue ESA during treatment.  # Secondary hyperparathyroidism: PTH 339.  Phosphorus level expect to improve with dialysis.  # HTN/volume: Volume status acceptable.  Discontinue IV Lasix since patient is now on dialysis.  Monitor BP.  Probably need to de-escalate antihypertensive medication gradually.  #Disposition: Social worker consulted to arrange outpatient HD at [facility] unit in [place].    Subjective: Seen and examined at bedside.  Tolerated dialysis well yesterday.  Today he denies nausea vomiting chest pain shortness of breath.  No new event. Objective Vital signs in last 24 hours: Vitals:   12/05/19 1830 12/05/19 1835 12/05/19 2048 12/06/19 0606  BP: 140/82 (!) 160/81 (!) 161/83 (!) 141/80  Pulse: 80 82 93 74  Resp:  18 20 20   Temp:  98.1 F (36.7 C) 99.8 F (37.7 C) 98.2 F (36.8 C)  TempSrc:  Oral Oral Oral  SpO2:   98% 99%  Weight:      Height:       Weight change: 0.3 kg  Intake/Output Summary (Last 24 hours) at 12/06/2019 0926 Last data filed at 12/06/2019 0750 Gross per 24 hour  Intake 325 ml  Output 2750 ml  Net -2425 ml       Labs: Basic Metabolic Panel: Recent Labs  Lab 12/02/19 0612 12/04/19 0628 12/05/19 0522  NA 140 140 140  K 4.1 3.8 3.8  CL 109 106 105  CO2 20* 23 24  GLUCOSE 97 123* 103*  BUN 85* 82* 85*  CREATININE 8.04* 8.50* 8.92*  CALCIUM 8.1* 8.5*  8.3*  PHOS 6.2* 6.3*  --    Liver Function Tests: Recent Labs  Lab 12/02/19 0612 12/04/19 0628  ALBUMIN 2.4* 2.6*   No results for input(s): LIPASE, AMYLASE in the last 168 hours. No results for input(s): AMMONIA in the last 168 hours. CBC: Recent Labs  Lab 11/30/19 1236 11/30/19 1236 12/01/19 0522 12/02/19 0612 12/05/19 0522  WBC 5.6   < > 5.6 4.3 3.6*  NEUTROABS 3.8  --   --  2.4  --   HGB 8.8*   < > 7.8* 7.6* 8.3*  HCT 28.4*   < > 25.4* 23.9* 26.3*  MCV 87.4  --  87.0 86.3 85.1  PLT 178   < > 155 164 176   < > = values in this interval not displayed.   Cardiac Enzymes: No results for input(s): CKTOTAL, CKMB, CKMBINDEX, TROPONINI in the last 168 hours. CBG: Recent Labs  Lab 12/05/19 0923 12/05/19 1337 12/05/19 1547 12/05/19 2045 12/06/19 0729  GLUCAP 87 87 97 235* 104*    Iron Studies: No results for input(s): IRON, TIBC, TRANSFERRIN, FERRITIN in the last 72 hours. Studies/Results: DG Chest Port 1 View  Result Date: 12/05/2019 CLINICAL DATA:  Dialysis catheter placement. EXAM: PORTABLE CHEST 1 VIEW COMPARISON:  Earlier same day FINDINGS: Right internal jugular catheter has been placed. The tip is in the SVC at the azygos level,  approximately 5 cm above the SVC RA junction. No pneumothorax. Heart size is normal. The lungs are clear. No edema. IMPRESSION: Right internal jugular central line tip in the SVC at the azygos level, 5 cm above the SVC RA junction. No pneumothorax. Electronically Signed   By: Nelson Chimes M.D.   On: 12/05/2019 13:42   DG C-Arm 1-60 Min-No Report  Result Date: 12/05/2019 Fluoroscopy was utilized by the requesting physician.  No radiographic interpretation.    Medications: Infusions: . sodium chloride    . sodium chloride    . sodium chloride    . ferumoxytol 510 mg (12/02/19 1315)    Scheduled Medications: . amLODipine  10 mg Oral Daily  . atorvastatin  10 mg Oral Daily  . calcitRIOL  0.25 mcg Oral Once per day on Mon Wed Fri  .  Chlorhexidine Gluconate Cloth  6 each Topical Daily  . darbepoetin (ARANESP) injection - DIALYSIS  60 mcg Intravenous Q Mon-HD  . furosemide  80 mg Intravenous Q8H  . hydrALAZINE  50 mg Oral TID  . insulin glargine  20 Units Subcutaneous QHS  . metoprolol tartrate  25 mg Oral BID  . pantoprazole  40 mg Oral Daily  . sodium bicarbonate  650 mg Oral BID  . sodium chloride flush  3 mL Intravenous Q12H    have reviewed scheduled and prn medications.  Physical Exam: General:NAD, comfortable Heart:RRR, s1s2 nl Lungs:clear b/l, no crackle Abdomen:soft, Non-tender, non-distended Extremities:No edema Dialysis Access: Right IJ TDC  Sierah Lacewell Tanna Furry 12/06/2019,9:26 AM  LOS: 5 days  Pager: [phone]

## 2019-12-06 NOTE — Progress Notes (Signed)
Patient Demographics:    Randy Oneal, is a 54 y.o. male, DOB - 06/07/1966, ZOX:[id number]  Admit date - 12/01/2019   Admitting Physician Lynetta Mare, MD  Outpatient Primary MD for the patient is [name], MD  LOS - 5  Chief Complaint  Patient presents with  . Abnormal Lab       Subjective:    Randy Oneal today has no fevers, no emesis,  No chest pain, ---Tolerated HD well on 12/05/2019 -Discussed with Dr. Carolin Sicks  Assessment  & Plan :    Principal Problem:   Acute on chronic renal failure [facility]) Active Problems:   Type 2 diabetes mellitus not at goal [facility])   HTN (hypertension), benign   Hyperlipidemia associated with type 2 diabetes mellitus ([place])   Diabetic neuropathy (HCC)   ESRD needing dialysis [facility])  Brief Summary:- 53 y.o.malewith medical history significant ofhypertension, diabetes mellitus type 2, retinopathy, neuropathy, chronic kidney disease admitted on 12/01/2019 with dyspnea on exertion and lower extremity edema found to have volume overload status -Initiated on HD on 12/05/2019 awaiting clipping process prior to discharge  A/p 1)ESRD---Discussed with nephrology and general surgery service -- s/p  temporary HD catheter placement and he was initiated on hemodialysis on Monday, 12/05/2019 -Second HD session 12/06/2019 via Rt IJ TDC - clipping process has begun -Per nephrologist okay to stop IV Lasix  2)HTN--- stable, continue amlodipine 10 mg daily, metoprolol 25 mg twice daily , hydralazine 50 mg 3 times daily, and IV Lasix  3)Anemia of CKD--hemoglobin 8.3--- Feraheme per nephrologist on 12/02/2019 -Defer decision on ESA/Epogen to nephrology team  4)DM2--A1c is 8.8 reflecting uncontrolled DM PTA, Use Novolog/Humalog Sliding scale insulin with Accu-Cheks/Fingersticks as ordered -Lantus was decreased to 20 units due to concerns about insulin stacking with episodes of  hypoglycemia in the hospital here  5) secondary hyper parathyroidism--PTH 339, managed by nephrology team, anticipate improvement with hemodialysis  Disposition/Need for in-Hospital Stay- patient unable to be discharged at this time due to --- need IV diuresis and hemodialysis initiation* -Patient From: home D/C Place: home Barriers: Not Clinically Stable- --- patient is new to hemodialysis and will need clipping process and outpatient hemodialysis spot/chair prior to discharge --Initiated on HD on 12/05/2019 awaiting clipping process prior to discharge   Code Status : full   Procedures:- s/p  temporary HD catheter placement and initiation of hemodialysis on Monday, 12/05/2019 -HD treatment on 426 on 12/06/2019  Family Communication:   Son and wife -- (patient is alert, awake and coherent)  Consults  :  Nephrology/Gen Surgery  DVT Prophylaxis  :    - SCDs (Heparin on hold for [facility] placement)  Lab Results  Component Value Date   PLT 176 12/05/2019   Inpatient Medications  Scheduled Meds: . amLODipine  10 mg Oral Daily  . atorvastatin  10 mg Oral Daily  . calcitRIOL  0.25 mcg Oral Once per day on Mon Wed Fri  . Chlorhexidine Gluconate Cloth  6 each Topical Daily  . Chlorhexidine Gluconate Cloth  6 each Topical Q0600  . darbepoetin (ARANESP) injection - DIALYSIS  60 mcg Intravenous Q Mon-HD  . hydrALAZINE  50 mg Oral TID  . insulin glargine  20 Units Subcutaneous QHS  . metoprolol tartrate  25 mg Oral BID  . pantoprazole  40 mg Oral Daily  . sodium chloride flush  3 mL Intravenous Q12H   Continuous Infusions: . sodium chloride    . sodium chloride    . sodium chloride    . ferumoxytol 510 mg (12/02/19 1315)   PRN Meds:.sodium chloride, sodium chloride, sodium chloride, alteplase, bisacodyl, famotidine, heparin, ondansetron **OR** ondansetron (ZOFRAN) IV, polyethylene glycol, simethicone, sodium chloride flush   Anti-infectives (From admission, onward)   Start      Dose/Rate Route Frequency Ordered Stop   12/05/19 1100  ceFAZolin (ANCEF) IVPB 2g/100 mL premix     2 g 200 mL/hr over 30 Minutes Intravenous On call to O.R. 12/05/19 1055 12/05/19 1250        Objective:   Vitals:   12/05/19 1830 12/05/19 1835 12/05/19 2048 12/06/19 0606  BP: 140/82 (!) 160/81 (!) 161/83 (!) 141/80  Pulse: 80 82 93 74  Resp:  18 20 20   Temp:  98.1 F (36.7 C) 99.8 F (37.7 C) 98.2 F (36.8 C)  TempSrc:  Oral Oral Oral  SpO2:   98% 99%  Weight:      Height:        Wt Readings from Last 3 Encounters:  12/05/19 98.5 kg  11/30/19 105.8 kg  06/15/19 88.5 kg    Intake/Output Summary (Last 24 hours) at 12/06/2019 1106 Last data filed at 12/06/2019 0750 Gross per 24 hour  Intake 325 ml  Output 2750 ml  Net -2425 ml   Physical Exam  Gen:- Awake Alert,  In no apparent distress  HEENT:- [place].AT, No sclera icterus , prominent eyeballs difficulty keeping eyes open (not new for him) Neck-Supple Neck, Rt IJ TDC   Lungs-improved air movement, no wheezing  CV- S1, S2 normal, regular  Abd-  +ve B.Sounds, Abd Soft, No tenderness,    Extremity/Skin:-Much improved LE pitting edema, pedal pulses present ,   Psych-affect is appropriate, oriented x3 Neuro-no new focal deficits, no tremors   Data Review:   Micro Results Recent Results (from the past 240 hour(s))  Respiratory Panel by RT PCR (Flu A&B, Covid) - Nasopharyngeal Swab     Status: None   Collection Time: 12/01/19  2:31 AM   Specimen: Nasopharyngeal Swab  Result Value Ref Range Status   SARS Coronavirus 2 by RT PCR NEGATIVE NEGATIVE Final    Comment: (NOTE) SARS-CoV-2 target nucleic acids are NOT DETECTED. The SARS-CoV-2 RNA is generally detectable in upper respiratoy specimens during the acute phase of infection. The lowest concentration of SARS-CoV-2 viral copies this assay can detect is 131 copies/mL. A negative result does not preclude SARS-Cov-2 infection and should not be used as the sole basis for  treatment or other patient management decisions. A negative result may occur with  improper specimen collection/handling, submission of specimen other than nasopharyngeal swab, presence of viral mutation(s) within the areas targeted by this assay, and inadequate number of viral copies (<131 copies/mL). A negative result must be combined with clinical observations, patient history, and epidemiological information. The expected result is Negative. Fact Sheet for Patients:  PinkCheek.be Fact Sheet for Healthcare Providers:  GravelBags.it This test is not yet ap proved or cleared by the [place] FDA and  has been authorized for detection and/or diagnosis of SARS-CoV-2 by FDA under an Emergency Use Authorization (EUA). This EUA will remain  in effect (meaning this test can be used) for the duration of the COVID-19 declaration under Section 564(b)(1) of the Act, 21 U.S.C.  section 360bbb-3(b)(1), unless the authorization is terminated or revoked sooner.    Influenza A by PCR NEGATIVE NEGATIVE Final   Influenza B by PCR NEGATIVE NEGATIVE Final    Comment: (NOTE) The Xpert Xpress SARS-CoV-2/FLU/RSV assay is intended as an aid in  the diagnosis of influenza from Nasopharyngeal swab specimens and  should not be used as a sole basis for treatment. Nasal washings and  aspirates are unacceptable for Xpert Xpress SARS-CoV-2/FLU/RSV  testing. Fact Sheet for Patients: PinkCheek.be Fact Sheet for Healthcare Providers: GravelBags.it This test is not yet approved or cleared by the [place] FDA and  has been authorized for detection and/or diagnosis of SARS-CoV-2 by  FDA under an Emergency Use Authorization (EUA). This EUA will remain  in effect (meaning this test can be used) for the duration of the  Covid-19 declaration under Section 564(b)(1) of the Act, 21  U.S.C. section  360bbb-3(b)(1), unless the authorization is  terminated or revoked. Performed at [facility], [address], [place], [place] [address]     Radiology Reports DG Chest 2 View  Result Date: 12/01/2019 CLINICAL DATA:  Shortness of breath EXAM: CHEST - 2 VIEW COMPARISON:  CT 11/24/2018 FINDINGS: Heart and mediastinal contours are within normal limits. No focal opacities or effusions. No acute bony abnormality. IMPRESSION: No active cardiopulmonary disease. Electronically Signed   By: Rolm Baptise M.D.   On: 12/01/2019 01:38   DG Chest Port 1 View  Result Date: 12/05/2019 CLINICAL DATA:  Dialysis catheter placement. EXAM: PORTABLE CHEST 1 VIEW COMPARISON:  Earlier same day FINDINGS: Right internal jugular catheter has been placed. The tip is in the SVC at the azygos level, approximately 5 cm above the SVC RA junction. No pneumothorax. Heart size is normal. The lungs are clear. No edema. IMPRESSION: Right internal jugular central line tip in the SVC at the azygos level, 5 cm above the SVC RA junction. No pneumothorax. Electronically Signed   By: Nelson Chimes M.D.   On: 12/05/2019 13:42   DG C-Arm 1-60 Min-No Report  Result Date: 12/05/2019 Fluoroscopy was utilized by the requesting physician.  No radiographic interpretation.     CBC Recent Labs  Lab 11/30/19 1236 12/01/19 0522 12/02/19 0612 12/05/19 0522  WBC 5.6 5.6 4.3 3.6*  HGB 8.8* 7.8* 7.6* 8.3*  HCT 28.4* 25.4* 23.9* 26.3*  PLT 178 155 164 176  MCV 87.4 87.0 86.3 85.1  MCH 27.1 26.7 27.4 26.9  MCHC 31.0 30.7 31.8 31.6  RDW 13.2 13.2 13.1 13.0  LYMPHSABS 1.1  --  1.2  --   MONOABS 0.6  --  0.5  --   EOSABS 0.2  --  0.2  --   BASOSABS 0.0  --  0.0  --     Chemistries  Recent Labs  Lab 12/01/19 0134 12/01/19 0522 12/02/19 0612 12/04/19 0628 12/05/19 0522  NA 138 138 140 140 140  K 4.2 4.0 4.1 3.8 3.8  CL 108 110 109 106 105  CO2 19* 18* 20* 23 24  GLUCOSE 190* 186* 97 123* 103*  BUN 77* 79* 85* 82* 85*  CREATININE  7.71* 8.05* 8.04* 8.50* 8.92*  CALCIUM 7.8* 7.6* 8.1* 8.5* 8.3*   ------------------------------------------------------------------------------------------------------------------ No results for input(s): CHOL, HDL, LDLCALC, TRIG, CHOLHDL, LDLDIRECT in the last 72 hours.  Lab Results  Component Value Date   HGBA1C 8.8 (H) 11/30/2019   ------------------------------------------------------------------------------------------------------------------ No results for input(s): TSH, T4TOTAL, T3FREE, THYROIDAB in the last 72 hours.  Invalid input(s): FREET3 ------------------------------------------------------------------------------------------------------------------ No results  for input(s): VITAMINB12, FOLATE, FERRITIN, TIBC, IRON, RETICCTPCT in the last 72 hours.  Coagulation profile Recent Labs  Lab 12/02/19 0740  INR 1.1    No results for input(s): DDIMER in the last 72 hours.  Cardiac Enzymes No results for input(s): CKMB, TROPONINI, MYOGLOBIN in the last 168 hours.  Invalid input(s): CK ------------------------------------------------------------------------------------------------------------------    Component Value Date/Time   BNP 212.0 (H) 11/30/2019 1236   Velna Hedgecock M.D on 12/06/2019 at 11:06 AM  Go to [email or web address] - for contact info  [facility] - Office  [phone]

## 2019-12-06 NOTE — TOC Progression Note (Addendum)
Transition of Care [facility]) - Progression Note    Patient Details  Name: Randy Oneal MRN: [id number] Date of Birth: 1965/12/14  Transition of Care [facility]) CM/SW Contact  Ihor Gully, LCSW Phone Number: 12/06/2019, 4:56 PM  Clinical Narrative:    Call made to [facility] staff had left for the day. Will follow up tomorrow regarding status of financial clearance.   Expected Discharge Plan: Home/Self Care Barriers to Discharge: Continued Medical Work up, Waiting for outpatient dialysis  Expected Discharge Plan and Services Expected Discharge Plan: Home/Self Care       Living arrangements for the past 2 months: Single Family Home                                       Social Determinants of Health (SDOH) Interventions    Readmission Risk Interventions No flowsheet data found.

## 2019-12-06 NOTE — Procedures (Signed)
     HEMODIALYSIS TREATMENT NOTE (HD#2):  Suspect kink in catheter, unfortunately.  Repeated flow interruption (every 2-5 minutes) with minimal to no patient movement.  Max Qb 195, down to 160cc/m.  Line reversal failed to correct wildly fluctuating neg arterial pressures.  Attempted repositioning of patient and cath limb irrigation without success.  Will d/w Dr. Constance Haw tomorrow.  2.5 hour tx completed with flow interruptions every 2-5 minutes.  Goal met: 1 liter removed.  No interruption in UF.  All blood was returned.  No changes from pre-HD assessment.   Rockwell Alexandria, RN

## 2019-12-07 ENCOUNTER — Inpatient Hospital Stay (HOSPITAL_COMMUNITY): Payer: Self-pay

## 2019-12-07 ENCOUNTER — Encounter (HOSPITAL_COMMUNITY)
Admission: EM | Disposition: A | Discharge status: Home / Self Care | Payer: Self-pay | Source: Home / Self Care | Attending: Family Medicine

## 2019-12-07 DIAGNOSIS — E0941 Drug or chemical induced diabetes mellitus with neurological complications with diabetic mononeuropathy: Secondary | ICD-10-CM

## 2019-12-07 HISTORY — PX: INSERTION OF DIALYSIS CATHETER: SHX1324

## 2019-12-07 LAB — CBC
HCT: 29.6 % — ABNORMAL LOW (ref 39.0–52.0)
Hemoglobin: 9.1 g/dL — ABNORMAL LOW (ref 13.0–17.0)
MCH: 26.4 pg (ref 26.0–34.0)
MCHC: 30.7 g/dL (ref 30.0–36.0)
MCV: 85.8 fL (ref 80.0–100.0)
Platelets: 120 10*3/uL — ABNORMAL LOW (ref 150–400)
RBC: 3.45 MIL/uL — ABNORMAL LOW (ref 4.22–5.81)
RDW: 12.9 % (ref 11.5–15.5)
WBC: 4.1 10*3/uL (ref 4.0–10.5)
nRBC: 0 % (ref 0.0–0.2)

## 2019-12-07 LAB — BASIC METABOLIC PANEL
Anion gap: 11 (ref 5–15)
BUN: 48 mg/dL — ABNORMAL HIGH (ref 6–20)
CO2: 27 mmol/L (ref 22–32)
Calcium: 8.4 mg/dL — ABNORMAL LOW (ref 8.9–10.3)
Chloride: 101 mmol/L (ref 98–111)
Creatinine, Ser: 6.52 mg/dL — ABNORMAL HIGH (ref 0.61–1.24)
GFR calc Af Amer: 10 mL/min — ABNORMAL LOW (ref >60)
GFR calc non Af Amer: 9 mL/min — ABNORMAL LOW (ref >60)
Glucose, Bld: 160 mg/dL — ABNORMAL HIGH (ref 70–99)
Potassium: 3.9 mmol/L (ref 3.5–5.1)
Sodium: 139 mmol/L (ref 135–145)

## 2019-12-07 LAB — PHOSPHORUS: Phosphorus: 4.9 mg/dL — ABNORMAL HIGH (ref 2.5–4.6)

## 2019-12-07 LAB — GLUCOSE, CAPILLARY
Glucose-Capillary: 144 mg/dL — ABNORMAL HIGH (ref 70–99)
Glucose-Capillary: 240 mg/dL — ABNORMAL HIGH (ref 70–99)
Glucose-Capillary: 193 mg/dL — ABNORMAL HIGH (ref 70–99)
Glucose-Capillary: 200 mg/dL — ABNORMAL HIGH (ref 70–99)

## 2019-12-07 LAB — HEPATITIS B SURFACE ANTIBODY, QUANTITATIVE: Hep B S AB Quant (Post): <3.1 m[IU]/mL — ABNORMAL LOW (ref >9.9)

## 2019-12-07 SURGERY — INSERTION OF DIALYSIS CATHETER
Anesthesia: LOCAL | Laterality: Right

## 2019-12-07 MED ORDER — CEFAZOLIN SODIUM-DEXTROSE 2-4 GM/100ML-% IV SOLN: 2.0000 g | INTRAVENOUS | Status: DC

## 2019-12-07 MED ORDER — CHLORHEXIDINE GLUCONATE CLOTH 2 % EX PADS
6.0000 | MEDICATED_PAD | Freq: Once | CUTANEOUS | Status: AC
  Administered 2019-12-07: 23:00:00 6 via TOPICAL

## 2019-12-07 MED ORDER — CEFAZOLIN SODIUM-DEXTROSE 2-4 GM/100ML-% IV SOLN
2.0000 g | INTRAVENOUS | Status: DC
  Filled 2019-12-07: qty 100

## 2019-12-07 MED ORDER — INSULIN ASPART 100 UNIT/ML ~~LOC~~ SOLN
0.0000 [IU] | Freq: Three times a day (TID) | SUBCUTANEOUS | Status: DC
  Administered 2019-12-08: 1 [IU] via SUBCUTANEOUS

## 2019-12-07 MED ORDER — LIDOCAINE HCL (PF) 1 % IJ SOLN
INTRAMUSCULAR | Status: DC | PRN
  Administered 2019-12-07: 9 mL via SUBCUTANEOUS

## 2019-12-07 MED ORDER — CHLORHEXIDINE GLUCONATE CLOTH 2 % EX PADS
6.0000 | MEDICATED_PAD | Freq: Once | CUTANEOUS | Status: DC
  Administered 2019-12-07: 10:00:00 6 via TOPICAL

## 2019-12-07 MED ORDER — HEPARIN SODIUM (PORCINE) 1000 UNIT/ML IJ SOLN
INTRAMUSCULAR | Status: AC
  Filled 2019-12-07: qty 4

## 2019-12-07 MED ORDER — SODIUM CHLORIDE (PF) 0.9 % IJ SOLN
INTRAMUSCULAR | Status: AC
  Filled 2019-12-07: qty 10

## 2019-12-07 MED ORDER — CEFAZOLIN SODIUM-DEXTROSE 2-4 GM/100ML-% IV SOLN
INTRAVENOUS | Status: AC
  Filled 2019-12-07: qty 100

## 2019-12-07 MED ORDER — LIDOCAINE HCL (PF) 1 % IJ SOLN
INTRAMUSCULAR | Status: AC
  Filled 2019-12-07: qty 30

## 2019-12-07 MED ORDER — CHLORHEXIDINE GLUCONATE CLOTH 2 % EX PADS
6.0000 | MEDICATED_PAD | Freq: Every day | CUTANEOUS | Status: DC
  Administered 2019-12-08: 06:00:00 6 via TOPICAL

## 2019-12-07 MED ORDER — HYDRALAZINE HCL 25 MG PO TABS
100.0000 mg | ORAL_TABLET | Freq: Three times a day (TID) | ORAL | Status: DC
  Administered 2019-12-07 – 2019-12-08 (×4): 100 mg via ORAL
  Filled 2019-12-07 (×4): qty 4

## 2019-12-07 MED ORDER — CHLORHEXIDINE GLUCONATE CLOTH 2 % EX PADS
6.0000 | MEDICATED_PAD | Freq: Once | CUTANEOUS | Status: AC
  Administered 2019-12-07: 6 via TOPICAL

## 2019-12-07 MED ORDER — CHLORHEXIDINE GLUCONATE CLOTH 2 % EX PADS: 6.0000 | MEDICATED_PAD | Freq: Once | CUTANEOUS | Status: DC

## 2019-12-07 SURGICAL SUPPLY — 49 items
ADH SKN CLS APL DERMABOND .7 (GAUZE/BANDAGES/DRESSINGS) ×1
APL PRP STRL LF ISPRP CHG 10.5 (MISCELLANEOUS) ×1
APPLICATOR CHLORAPREP 10.5 ORG (MISCELLANEOUS) ×2 IMPLANT
BAG DECANTER FOR FLEXI CONT (MISCELLANEOUS) ×2 IMPLANT
BIOPATCH RED 1 DISK 7.0 (GAUZE/BANDAGES/DRESSINGS) ×2 IMPLANT
CATH PALINDROME-P 19CM W/VT (CATHETERS) IMPLANT
CATH PALINDROME-P 23CM W/VT (CATHETERS) IMPLANT
COVER LIGHT HANDLE STERIS (MISCELLANEOUS) ×4 IMPLANT
COVER PROBE U/S 5X48 (MISCELLANEOUS) ×2 IMPLANT
COVER WAND RF STERILE (DRAPES) ×2 IMPLANT
DECANTER SPIKE VIAL GLASS SM (MISCELLANEOUS) ×4 IMPLANT
DERMABOND ADVANCED (GAUZE/BANDAGES/DRESSINGS) ×1
DERMABOND ADVANCED .7 DNX12 (GAUZE/BANDAGES/DRESSINGS) ×1 IMPLANT
DRAPE C-ARM FOLDED MOBILE STRL (DRAPES) ×2 IMPLANT
DRAPE CHEST BREAST 15X10 FENES (DRAPES) ×2 IMPLANT
DRSG SORBAVIEW 3.5X5-5/16 MED (GAUZE/BANDAGES/DRESSINGS) ×2 IMPLANT
ELECT REM PT RETURN 9FT ADLT (ELECTROSURGICAL) ×2
ELECTRODE REM PT RTRN 9FT ADLT (ELECTROSURGICAL) ×1 IMPLANT
GAUZE 4X4 16PLY RFD (DISPOSABLE) ×2 IMPLANT
GEL ULTRASOUND 20GR AQUASONIC (MISCELLANEOUS) ×2 IMPLANT
GLOVE BIO SURGEON STRL SZ 6.5 (GLOVE) ×2 IMPLANT
GLOVE BIOGEL PI IND STRL 6.5 (GLOVE) ×1 IMPLANT
GLOVE BIOGEL PI IND STRL 7.0 (GLOVE) ×2 IMPLANT
GLOVE BIOGEL PI INDICATOR 6.5 (GLOVE) ×1
GLOVE BIOGEL PI INDICATOR 7.0 (GLOVE) ×2
GOWN STRL REUS W/TWL LRG LVL3 (GOWN DISPOSABLE) ×4 IMPLANT
IV CONNECTOR ONE LINK NDLESS (IV SETS) IMPLANT
IV NS 500ML (IV SOLUTION) ×2
IV NS 500ML BAXH (IV SOLUTION) ×1 IMPLANT
KIT BLADEGUARD II DBL (SET/KITS/TRAYS/PACK) ×2 IMPLANT
KIT CATH PALINDROME CHRONIC (CATHETERS) IMPLANT
KIT TURNOVER KIT A (KITS) ×2 IMPLANT
MARKER SKIN DUAL TIP RULER LAB (MISCELLANEOUS) ×2 IMPLANT
NDL HYPO 18GX1.5 BLUNT FILL (NEEDLE) ×1 IMPLANT
NDL HYPO 25X1 1.5 SAFETY (NEEDLE) ×1 IMPLANT
NEEDLE HYPO 18GX1.5 BLUNT FILL (NEEDLE) ×2 IMPLANT
NEEDLE HYPO 25X1 1.5 SAFETY (NEEDLE) ×2 IMPLANT
PACK BASIC III (CUSTOM PROCEDURE TRAY) ×2
PACK SRG BSC III STRL LF ECLPS (CUSTOM PROCEDURE TRAY) ×1 IMPLANT
PAD ARMBOARD 7.5X6 YLW CONV (MISCELLANEOUS) ×2 IMPLANT
PENCIL SMOKE EVACUATOR COATED (MISCELLANEOUS) ×2 IMPLANT
SET BASIN LINEN APH (SET/KITS/TRAYS/PACK) ×2 IMPLANT
SUT MNCRL AB 4-0 PS2 18 (SUTURE) ×2 IMPLANT
SUT SILK 2 0 FSL 18 (SUTURE) ×2 IMPLANT
SUT VIC AB 3-0 SH 27 (SUTURE) ×2
SUT VIC AB 3-0 SH 27X BRD (SUTURE) ×1 IMPLANT
SYR 20ML LL LF (SYRINGE) ×2 IMPLANT
SYR 5ML LL (SYRINGE) ×2 IMPLANT
SYR CONTROL 10ML LL (SYRINGE) ×2 IMPLANT

## 2019-12-07 NOTE — Progress Notes (Signed)
[facility]  Patient having issues with flow from catheter. Xray with catheter in place and is a little short, at azygos, does have more of a acute curve in the neck than prior.   Discussed with patient and dialysis RN. Will try to see if I can manipulate the catheter at the IJ site to improve flow. If this does not work, then will have to replace the catheter and will opt the longer catheter at that time.  Patient agrees.  Curlene Labrum, MD [facility] [address] [place], [place] [address] [phone] (office)

## 2019-12-07 NOTE — Progress Notes (Addendum)
[facility] NEPHROLOGY PROGRESS NOTE  Assessment/ Plan: Pt is a 54 y.o. yo male with hypertension, diabetes, CKD followed by Dr. Royce Oneal at [facility] now with new ESRD.  #Acute kidney injury on CKD progressed to new ESRD: Status post right IJ TDC placed by Dr. Constance Oneal on 4/26 and received HD on 4/26 and 4/27. There was a problem with dialysis catheter with poor blood flow yesterday.  Dr. Constance Oneal was contacted for possible intervention, getting x-ray. Plan for next HD tomorrow. Consulted SW for outpatient HD arrangement probably in [facility] unit in [place]. Need vascular referral for permanent access as outpatient. -Discontinued oral sodium bicarbonate  # Anemia of CKD: Continue IV iron and ESA during HD. Monitor CBC.  # Secondary hyperparathyroidism: PTH 339, continue calcitriol.  Phosphorus level expect to improve with dialysis, check phos level.  # HTN/volume: Volume status acceptable.  Discontinued IV Lasix since patient is now on dialysis.  Blood pressure elevated therefore increase the dose of hydralazine to 100 mg 3 times daily.  Continue amlodipine and UF during dialysis.  Monitor BP.    #Disposition: Social worker consulted to arrange outpatient HD at [facility] unit in [place].    Subjective: Seen and examined at bedside.  Noted problem with HD catheter yesterday.  Denies nausea, vomiting, chest pain, shortness of breath.  No other new event. Objective Vital signs in last 24 hours: Vitals:   12/06/19 2100 12/06/19 2128 12/07/19 0500 12/07/19 0600  BP: (!) 179/90 (!) 171/86  (!) 168/95  Pulse: 84 86  71  Resp:  20  15  Temp:  99.2 F (37.3 C)  98.2 F (36.8 C)  TempSrc:  Oral  Oral  SpO2:  98%  100%  Weight:   89 kg   Height:       Weight change: -9.55 kg  Intake/Output Summary (Last 24 hours) at 12/07/2019 0917 Last data filed at 12/07/2019 0603 Gross per 24 hour  Intake 240 ml  Output 2223 ml  Net -1983 ml       Labs: Basic Metabolic  Panel: Recent Labs  Lab 12/02/19 0612 12/02/19 0612 12/04/19 0628 12/05/19 0522 12/07/19 0553  NA 140   < > 140 140 139  K 4.1   < > 3.8 3.8 3.9  CL 109   < > 106 105 101  CO2 20*   < > 23 24 27   GLUCOSE 97   < > 123* 103* 160*  BUN 85*   < > 82* 85* 48*  CREATININE 8.04*   < > 8.50* 8.92* 6.52*  CALCIUM 8.1*   < > 8.5* 8.3* 8.4*  PHOS 6.2*  --  6.3*  --   --    < > = values in this interval not displayed.   Liver Function Tests: Recent Labs  Lab 12/02/19 0612 12/04/19 0628  ALBUMIN 2.4* 2.6*   No results for input(s): LIPASE, AMYLASE in the last 168 hours. No results for input(s): AMMONIA in the last 168 hours. CBC: Recent Labs  Lab 11/30/19 1236 11/30/19 1236 12/01/19 0522 12/01/19 0522 12/02/19 0612 12/05/19 0522 12/07/19 0553  WBC 5.6   < > 5.6   < > 4.3 3.6* 4.1  NEUTROABS 3.8  --   --   --  2.4  --   --   HGB 8.8*   < > 7.8*   < > 7.6* 8.3* 9.1*  HCT 28.4*   < > 25.4*   < > 23.9* 26.3* 29.6*  MCV 87.4  --  87.0  --  86.3 85.1 85.8  PLT 178   < > 155   < > 164 176 120*   < > = values in this interval not displayed.   Cardiac Enzymes: No results for input(s): CKTOTAL, CKMB, CKMBINDEX, TROPONINI in the last 168 hours. CBG: Recent Labs  Lab 12/06/19 0729 12/06/19 1151 12/06/19 1613 12/06/19 2131 12/07/19 0721  GLUCAP 104* 165* 207* 159* 144*    Iron Studies: No results for input(s): IRON, TIBC, TRANSFERRIN, FERRITIN in the last 72 hours. Studies/Results: DG Chest Port 1 View  Result Date: 12/07/2019 CLINICAL DATA:  Dialysis catheter positioning EXAM: PORTABLE CHEST 1 VIEW COMPARISON:  December 05, 2019 FINDINGS: Dialysis catheter tip is near the junction of the superior vena cava and azygos vein. The tip of the catheter potentially may reside within the azygos vein. No pneumothorax. Lungs clear. Heart size and pulmonary vascularity are normal. No adenopathy. No bone lesions. IMPRESSION: Central catheter tip is at the junction of the superior vena cava and  azygos vein. The tip may reside within the azygos vein. Lateral view could be helpful for further assessment in this regard if further assessment is felt to be warranted. No pneumothorax. Lungs clear. Stable cardiac silhouette. Electronically Signed   By: Randy Oneal M.D.   On: 12/07/2019 09:06   DG Chest Port 1 View  Result Date: 12/05/2019 CLINICAL DATA:  Dialysis catheter placement. EXAM: PORTABLE CHEST 1 VIEW COMPARISON:  Earlier same day FINDINGS: Right internal jugular catheter has been placed. The tip is in the SVC at the azygos level, approximately 5 cm above the SVC RA junction. No pneumothorax. Heart size is normal. The lungs are clear. No edema. IMPRESSION: Right internal jugular central line tip in the SVC at the azygos level, 5 cm above the SVC RA junction. No pneumothorax. Electronically Signed   By: Randy Oneal M.D.   On: 12/05/2019 13:42   DG C-Arm 1-60 Min-No Report  Result Date: 12/05/2019 Fluoroscopy was utilized by the requesting physician.  No radiographic interpretation.    Medications: Infusions: . sodium chloride    . sodium chloride    . sodium chloride    . ferumoxytol 510 mg (12/02/19 1315)    Scheduled Medications: . amLODipine  10 mg Oral Daily  . atorvastatin  10 mg Oral Daily  . calcitRIOL  0.25 mcg Oral Once per day on Mon Wed Fri  . Chlorhexidine Gluconate Cloth  6 each Topical Daily  . Chlorhexidine Gluconate Cloth  6 each Topical Q0600  . darbepoetin (ARANESP) injection - DIALYSIS  60 mcg Intravenous Q Mon-HD  . hydrALAZINE  50 mg Oral TID  . insulin glargine  20 Units Subcutaneous QHS  . metoprolol tartrate  25 mg Oral BID  . pantoprazole  40 mg Oral Daily  . sodium chloride flush  3 mL Intravenous Q12H    have reviewed scheduled and prn medications.  Physical Exam: General: Sitting on chair, NAD, comfortable Heart:RRR, s1s2 nl Lungs:clear b/l, no crackle Abdomen:soft, Non-tender, non-distended Extremities:No edema Dialysis  Access: Right IJ TDC  Randy Oneal Randy Oneal 12/07/2019,9:17 AM  LOS: 6 days  Pager: [phone]

## 2019-12-07 NOTE — Progress Notes (Signed)
[facility]  Unfortunately catheter is in the azygos and needs repositioned deep/ exchanged. Will plan for AM. NPO midnight.  Curlene Labrum, MD [facility] [address] [place], [place] [address] [phone] (office)

## 2019-12-07 NOTE — OR Nursing (Signed)
Unable to scan patient armband. Obtained new armband and unable to scan as well. Called pharmacy to inform that nurse was unable to scan ancef . I spoke with Donna Christen he looked at patients chart . Noted that items were scanned from floor this am so his armband is correct.

## 2019-12-07 NOTE — TOC Progression Note (Signed)
Transition of Care [facility]) - Progression Note    Patient Details  Name: Randy Oneal MRN: [id number] Date of Birth: June 16, 1966  Transition of Care [facility]) CM/SW Contact  Karem Tomaso, Chauncey Reading, RN Phone Number: 12/07/2019, 1:23 PM  Clinical Narrative:    Call to [facility], patient does not have financial clearance at this time.    Expected Discharge Plan: Home/Self Care Barriers to Discharge: Continued Medical Work up, Waiting for outpatient dialysis  Expected Discharge Plan and Services Expected Discharge Plan: Home/Self Care       Living arrangements for the past 2 months: Single Family Home                        Social Determinants of Health (SDOH) Interventions    Readmission Risk Interventions No flowsheet data found.

## 2019-12-07 NOTE — Op Note (Signed)
[facility] Procedure Note  12/07/19  Preoperative Diagnosis: Malfunctioning right internal jugular tunneled dialysis catheter    Postoperative Diagnosis: Same   Procedure(s) Performed: Repositioning, adjustment of catheter at insertion site on internal jugular    Surgeon: Lanell Matar. Constance Haw, MD   Assistants: None    Anesthesia: 1% lidocaine    Anesthesiologist: None    Specimens: None    Estimated Blood Loss: Minimal   Blood Replacement: None    Complications: None   Wound Class: Clean    Operative Indications: [name] is a 54 yo s/p tunneled dialysis catheter. He has been having issues with flow during dialysis and suspect potential kinking at the internal jugular insertion site versus issues with depth with the catheter stopping at the level of the azygos.  The patient has been tolerating his runs but there has been frequent stops and flow issues.  A CXR ordered today shows some degree of acute curve in the neck, and I discussed the risk of doing a local procedure with 1% lidocaine to try and adjust this portion of the catheter. We discussed the risk of bleeding, infection and continued malfunction and need to replace, and he opted to proceed.   Findings: Tissue around catheter pinching at the entry site, unable to aspirate back on venous port prior to stretching tissue and adjusting catheter and after able to aspirate and flush both ports easily   Procedure: The patient was taken to the procedure room and placed supine. Intravenous antibiotics were administered given the implanted device.  The right neck and chest and the tunneled catheter itself was prepared and draped in the usual sterile fashion.  The dermabond was removed from the skin.   Lidocaine 1 % was injected at the internal jugular insertion site and down the tunneled portion onto the chest, and the incision was opened with a scalpel. The catheter had some minor kinking or pinching from the  surrounding tissues. The port hubs were opened and cleaned with alcohol. I first attempted aspiration from the arterial and venous port before any manipulation and had difficulty aspirating the venous port.    I took a hemostat and stretched the muscle overlying the tissue and eliminated any acute curve into the jugular at this sight. I also opened up and stretched out the tunnel along the catheter to ensure no compression was occurring at another point.    The venous and arterial ports aspirated and flushed easily, and I tested this multiple times after continuing to stretch out the tissue at the entry site on the jugular.    Overall improvement in aspiration and flushing. Will repeat a CXR to see for interval changes.  If this does not improve flows will need to exchange the catheter for a 28 from a 23 Palindrome.   Final inspection revealed acceptable hemostasis. All counts were correct at the end of the case. The patient returned to the floor in stable condition.    Curlene Labrum, MD [facility] [address] [place], [place] [address] [phone] (office)

## 2019-12-07 NOTE — Progress Notes (Signed)
Patient Demographics:    Randy Oneal, is a 54 y.o. male, DOB - 1966-06-07, JWL:[id number]  Admit date - 12/01/2019   Admitting Physician Lynetta Mare, MD  Outpatient Primary MD for the patient is [name], MD  LOS - 6  Chief Complaint  Patient presents with  . Abnormal Lab       Subjective:    [name] afebrile, no chest pain, no nausea, no vomiting.  So far has tolerated dialysis treatment provided well.  Will have catheter exchange on 12/08/2019 followed by dialysis treatment.  Clipping process in place.  Assessment  & Plan :    Principal Problem:   Acute on chronic renal failure (HCC) Active Problems:   Type 2 diabetes mellitus not at goal [facility])   HTN (hypertension), benign   Hyperlipidemia associated with type 2 diabetes mellitus (HCC)   Diabetic neuropathy (HCC)   ESRD needing dialysis [facility])  Brief Summary:- 53 y.o.malewith medical history significant ofhypertension, diabetes mellitus type 2, retinopathy, neuropathy, chronic kidney disease admitted on 12/01/2019 with dyspnea on exertion and lower extremity edema found to have volume overload status -Initiated on HD on 12/05/2019 awaiting clipping process prior to discharge  A/p 1)ESRD- -appreciate assistance and recommendations by nephrology service -General surgery helping with temporary tunneled catheter placement for dialysis. -Second HD session 12/06/2019 via Rt IJ TDC; unfortunately treatment was inefficient in the setting of poor performance from catheter.  After reviewing with general surgery catheter appears to be in the azygous and need to be reposition/exchange.  Plan is for exchange catheter on 12/08/2019 -Dialysis will be provided again on 12/08/2019. - clipping process has begun and is still pending. -Continue supportive care.  2)HTN--- stable, continue amlodipine 10 mg daily, metoprolol 25 mg twice daily ,  hydralazine 50 mg 3 times daily -Low-sodium diet has been encouraged -Anticipate further improvement in his blood pressure with dialysis.  3)Anemia of CKD--hemoglobin 8.3--- Feraheme per nephrologist on 12/02/2019 -Defer decision on ESA/Epogen to nephrology team -No signs of overt bleeding.  4)DM2 with nephropathy--A1c is 8.8 reflecting uncontrolled DM PTA,  -Use Novolog/Humalog Sliding scale insulin with Accu-Cheks/Fingersticks as ordered -Continue adjusted dose of Lantus -Modified carbohydrate diet has been encouraged.  5) secondary hyper parathyroidism--PTH 339, managed by nephrology team, anticipate improvement with hemodialysis and the use of Renvela.  Disposition/Need for in-Hospital Stay- patient unable to be discharged at this time due to --- needs outpatient dialysis appointment repositioning of dialysis catheter (planned for 12/08/2019).  -Patient From: home D/C Place: home Barriers: Not Clinically Stable- --- patient is new to hemodialysis and will need clipping process and outpatient hemodialysis spot/chair prior to discharge --Initiated on HD on 12/05/2019 awaiting clipping process prior to discharge   Code Status : full   Procedures:- s/p  temporary HD catheter placement and initiation of hemodialysis on Monday, 12/05/2019 -HD treatment on 426 and on 12/06/2019 -Planning for next hemodialysis treatment on 12/08/2019  Family Communication:   Son and wife -- (patient is alert, awake and coherent)  Consults  :  Nephrology/Gen Surgery  DVT Prophylaxis  :    - SCDs (Heparin on hold for [facility] placement)  Lab Results  Component Value Date   PLT 120 (L) 12/07/2019   Inpatient Medications  Scheduled Meds: .  amLODipine  10 mg Oral Daily  . atorvastatin  10 mg Oral Daily  . calcitRIOL  0.25 mcg Oral Once per day on Mon Wed Fri  . Chlorhexidine Gluconate Cloth  6 each Topical Daily  . Chlorhexidine Gluconate Cloth  6 each Topical Q0600  . Chlorhexidine Gluconate Cloth  6  each Topical Q0600  . darbepoetin (ARANESP) injection - DIALYSIS  60 mcg Intravenous Q Mon-HD  . hydrALAZINE  100 mg Oral TID  . insulin glargine  20 Units Subcutaneous QHS  . metoprolol tartrate  25 mg Oral BID  . pantoprazole  40 mg Oral Daily  . sodium chloride flush  3 mL Intravenous Q12H   Continuous Infusions: . sodium chloride    . sodium chloride    . sodium chloride    . ferumoxytol 510 mg (12/02/19 1315)   PRN Meds:.sodium chloride, sodium chloride, sodium chloride, alteplase, bisacodyl, famotidine, heparin, heparin, ondansetron **OR** ondansetron (ZOFRAN) IV, polyethylene glycol, simethicone, sodium chloride flush   Anti-infectives (From admission, onward)   Start     Dose/Rate Route Frequency Ordered Stop   12/07/19 1100  ceFAZolin (ANCEF) IVPB 2g/100 mL premix  Status:  Discontinued     2 g 200 mL/hr over 30 Minutes Intravenous On call to O.R. 12/07/19 1053 12/07/19 1141   12/05/19 1100  ceFAZolin (ANCEF) IVPB 2g/100 mL premix     2 g 200 mL/hr over 30 Minutes Intravenous On call to O.R. 12/05/19 1055 12/05/19 1250        Objective:   Vitals:   12/06/19 2128 12/07/19 0500 12/07/19 0600 12/07/19 1348  BP: (!) 171/86  (!) 168/95 (!) 142/83  Pulse: 86  71 76  Resp: 20  15 18   Temp: 99.2 F (37.3 C)  98.2 F (36.8 C) 98.2 F (36.8 C)  TempSrc: Oral  Oral   SpO2: 98%  100% 100%  Weight:  89 kg    Height:        Wt Readings from Last 3 Encounters:  12/07/19 89 kg  11/30/19 105.8 kg  06/15/19 88.5 kg    Intake/Output Summary (Last 24 hours) at 12/07/2019 1647 Last data filed at 12/07/2019 1300 Gross per 24 hour  Intake 720 ml  Output 2373 ml  Net -1653 ml   Physical Exam General exam: Alert, awake, oriented x 3; no chest pain, no nausea, no vomiting.  Patient is afebrile and reports significant improvement in his lower extremity swelling. Respiratory system: No wheezing, no crackles; normal respiratory effort. Cardiovascular system:RRR. No rubs or  gallops.  Right IJ in place. Gastrointestinal system: Abdomen is nondistended, soft and nontender. No organomegaly or masses felt. Normal bowel sounds heard. Central nervous system: Alert and oriented. No focal neurological deficits. Extremities: No cyanosis or clubbing.  Trace to 1+ edema bilaterally in his lower extremities. Skin: No rashes, no petechiae. Psychiatry: Judgement and insight appear normal. Mood & affect appropriate.     Data Review:   Micro Results Recent Results (from the past 240 hour(s))  Respiratory Panel by RT PCR (Flu A&B, Covid) - Nasopharyngeal Swab     Status: None   Collection Time: 12/01/19  2:31 AM   Specimen: Nasopharyngeal Swab  Result Value Ref Range Status   SARS Coronavirus 2 by RT PCR NEGATIVE NEGATIVE Final    Comment: (NOTE) SARS-CoV-2 target nucleic acids are NOT DETECTED. The SARS-CoV-2 RNA is generally detectable in upper respiratoy specimens during the acute phase of infection. The lowest concentration of SARS-CoV-2 viral copies this assay  can detect is 131 copies/mL. A negative result does not preclude SARS-Cov-2 infection and should not be used as the sole basis for treatment or other patient management decisions. A negative result may occur with  improper specimen collection/handling, submission of specimen other than nasopharyngeal swab, presence of viral mutation(s) within the areas targeted by this assay, and inadequate number of viral copies (<131 copies/mL). A negative result must be combined with clinical observations, patient history, and epidemiological information. The expected result is Negative. Fact Sheet for Patients:  PinkCheek.be Fact Sheet for Healthcare Providers:  GravelBags.it This test is not yet ap proved or cleared by the [place] FDA and  has been authorized for detection and/or diagnosis of SARS-CoV-2 by FDA under an Emergency Use Authorization (EUA).  This EUA will remain  in effect (meaning this test can be used) for the duration of the COVID-19 declaration under Section 564(b)(1) of the Act, 21 U.S.C. section 360bbb-3(b)(1), unless the authorization is terminated or revoked sooner.    Influenza A by PCR NEGATIVE NEGATIVE Final   Influenza B by PCR NEGATIVE NEGATIVE Final    Comment: (NOTE) The Xpert Xpress SARS-CoV-2/FLU/RSV assay is intended as an aid in  the diagnosis of influenza from Nasopharyngeal swab specimens and  should not be used as a sole basis for treatment. Nasal washings and  aspirates are unacceptable for Xpert Xpress SARS-CoV-2/FLU/RSV  testing. Fact Sheet for Patients: PinkCheek.be Fact Sheet for Healthcare Providers: GravelBags.it This test is not yet approved or cleared by the [place] FDA and  has been authorized for detection and/or diagnosis of SARS-CoV-2 by  FDA under an Emergency Use Authorization (EUA). This EUA will remain  in effect (meaning this test can be used) for the duration of the  Covid-19 declaration under Section 564(b)(1) of the Act, 21  U.S.C. section 360bbb-3(b)(1), unless the authorization is  terminated or revoked. Performed at [facility], [address], [place], [place] [address]     Radiology Reports DG Chest 2 View  Result Date: 12/07/2019 CLINICAL DATA:  Dialysis catheter placed on 12/05/2019 EXAM: CHEST - 2 VIEW COMPARISON:  12/07/2019, 12/05/2019 FINDINGS: RIGHT jugular dual-lumen central venous catheter with tip directed posteriorly in azygos arch. Normal heart size, mediastinal contours, and pulmonary vascularity. Lungs clear. No infiltrate, pleural effusion or pneumothorax. Osseous structures unremarkable. IMPRESSION: RIGHT jugular dual-lumen catheter is directed posteriorly within the azygos arch. Clear lungs. Discussed with Dr. Constance Haw at time of interpretation. Electronically Signed   By: Lavonia Dana M.D.   On:  12/07/2019 14:26   DG Chest 2 View  Result Date: 12/01/2019 CLINICAL DATA:  Shortness of breath EXAM: CHEST - 2 VIEW COMPARISON:  CT 11/24/2018 FINDINGS: Heart and mediastinal contours are within normal limits. No focal opacities or effusions. No acute bony abnormality. IMPRESSION: No active cardiopulmonary disease. Electronically Signed   By: Rolm Baptise M.D.   On: 12/01/2019 01:38   DG Chest Port 1 View  Result Date: 12/07/2019 CLINICAL DATA:  Dialysis catheter positioning EXAM: PORTABLE CHEST 1 VIEW COMPARISON:  December 05, 2019 FINDINGS: Dialysis catheter tip is near the junction of the superior vena cava and azygos vein. The tip of the catheter potentially may reside within the azygos vein. No pneumothorax. Lungs clear. Heart size and pulmonary vascularity are normal. No adenopathy. No bone lesions. IMPRESSION: Central catheter tip is at the junction of the superior vena cava and azygos vein. The tip may reside within the azygos vein. Lateral view could be helpful for further assessment in  this regard if further assessment is felt to be warranted. No pneumothorax. Lungs clear. Stable cardiac silhouette. Electronically Signed   By: Lowella Grip III M.D.   On: 12/07/2019 09:06   DG Chest Port 1 View  Result Date: 12/05/2019 CLINICAL DATA:  Dialysis catheter placement. EXAM: PORTABLE CHEST 1 VIEW COMPARISON:  Earlier same day FINDINGS: Right internal jugular catheter has been placed. The tip is in the SVC at the azygos level, approximately 5 cm above the SVC RA junction. No pneumothorax. Heart size is normal. The lungs are clear. No edema. IMPRESSION: Right internal jugular central line tip in the SVC at the azygos level, 5 cm above the SVC RA junction. No pneumothorax. Electronically Signed   By: Nelson Chimes M.D.   On: 12/05/2019 13:42   DG C-Arm 1-60 Min-No Report  Result Date: 12/05/2019 Fluoroscopy was utilized by the requesting physician.  No radiographic interpretation.      CBC Recent Labs  Lab 12/01/19 0522 12/02/19 0612 12/05/19 0522 12/07/19 0553  WBC 5.6 4.3 3.6* 4.1  HGB 7.8* 7.6* 8.3* 9.1*  HCT 25.4* 23.9* 26.3* 29.6*  PLT 155 164 176 120*  MCV 87.0 86.3 85.1 85.8  MCH 26.7 27.4 26.9 26.4  MCHC 30.7 31.8 31.6 30.7  RDW 13.2 13.1 13.0 12.9  LYMPHSABS  --  1.2  --   --   MONOABS  --  0.5  --   --   EOSABS  --  0.2  --   --   BASOSABS  --  0.0  --   --     Chemistries  Recent Labs  Lab 12/01/19 0522 12/02/19 0612 12/04/19 0628 12/05/19 0522 12/07/19 0553  NA 138 140 140 140 139  K 4.0 4.1 3.8 3.8 3.9  CL 110 109 106 105 101  CO2 18* 20* 23 24 27   GLUCOSE 186* 97 123* 103* 160*  BUN 79* 85* 82* 85* 48*  CREATININE 8.05* 8.04* 8.50* 8.92* 6.52*  CALCIUM 7.6* 8.1* 8.5* 8.3* 8.4*   ------------------------------------------------------------------------------------------------------------------ No results for input(s): CHOL, HDL, LDLCALC, TRIG, CHOLHDL, LDLDIRECT in the last 72 hours.  Lab Results  Component Value Date   HGBA1C 8.8 (H) 11/30/2019   ------------------------------------------------------------------------------------------------------------------ No results for input(s): TSH, T4TOTAL, T3FREE, THYROIDAB in the last 72 hours.  Invalid input(s): FREET3 ------------------------------------------------------------------------------------------------------------------ No results for input(s): VITAMINB12, FOLATE, FERRITIN, TIBC, IRON, RETICCTPCT in the last 72 hours.  Coagulation profile Recent Labs  Lab 12/02/19 0740  INR 1.1   Cardiac Enzymes No results for input(s): CKMB, TROPONINI, MYOGLOBIN in the last 168 hours.  Invalid input(s): CK ------------------------------------------------------------------------------------------------------------------    Component Value Date/Time   BNP 212.0 (H) 11/30/2019 1236   Barton Dubois M.D on 12/07/2019 at 4:47 PM  Go to [email or web address] - for contact info  [facility] - Office  [phone]

## 2019-12-08 ENCOUNTER — Inpatient Hospital Stay (HOSPITAL_COMMUNITY): Payer: Self-pay

## 2019-12-08 ENCOUNTER — Inpatient Hospital Stay (HOSPITAL_COMMUNITY): Payer: Self-pay | Admitting: Anesthesiology

## 2019-12-08 ENCOUNTER — Encounter (HOSPITAL_COMMUNITY): Payer: Self-pay | Admitting: Internal Medicine

## 2019-12-08 ENCOUNTER — Encounter (HOSPITAL_COMMUNITY)
Admission: EM | Disposition: A | Discharge status: Home / Self Care | Payer: Self-pay | Source: Home / Self Care | Attending: Family Medicine

## 2019-12-08 DIAGNOSIS — T8242XA Displacement of vascular dialysis catheter, initial encounter: Secondary | ICD-10-CM

## 2019-12-08 DIAGNOSIS — E1141 Type 2 diabetes mellitus with diabetic mononeuropathy: Secondary | ICD-10-CM

## 2019-12-08 HISTORY — PX: INSERTION OF DIALYSIS CATHETER: SHX1324

## 2019-12-08 LAB — RENAL FUNCTION PANEL
Albumin: 2.8 g/dL — ABNORMAL LOW (ref 3.5–5.0)
Anion gap: 11 (ref 5–15)
BUN: 61 mg/dL — ABNORMAL HIGH (ref 6–20)
CO2: 24 mmol/L (ref 22–32)
Calcium: 8.3 mg/dL — ABNORMAL LOW (ref 8.9–10.3)
Chloride: 102 mmol/L (ref 98–111)
Creatinine, Ser: 7.9 mg/dL — ABNORMAL HIGH (ref 0.61–1.24)
GFR calc Af Amer: 8 mL/min — ABNORMAL LOW (ref >60)
GFR calc non Af Amer: 7 mL/min — ABNORMAL LOW (ref >60)
Glucose, Bld: 164 mg/dL — ABNORMAL HIGH (ref 70–99)
Phosphorus: 5.4 mg/dL — ABNORMAL HIGH (ref 2.5–4.6)
Potassium: 4.3 mmol/L (ref 3.5–5.1)
Sodium: 137 mmol/L (ref 135–145)

## 2019-12-08 LAB — GLUCOSE, CAPILLARY
Glucose-Capillary: 160 mg/dL — ABNORMAL HIGH (ref 70–99)
Glucose-Capillary: 146 mg/dL — ABNORMAL HIGH (ref 70–99)
Glucose-Capillary: 168 mg/dL — ABNORMAL HIGH (ref 70–99)

## 2019-12-08 LAB — SURGICAL PCR SCREEN
MRSA, PCR: NEGATIVE
Staphylococcus aureus: POSITIVE — AB

## 2019-12-08 SURGERY — INSERTION OF DIALYSIS CATHETER
Anesthesia: General | Laterality: Right

## 2019-12-08 MED ORDER — FENTANYL CITRATE (PF) 100 MCG/2ML IJ SOLN: 25.0000 ug | INTRAMUSCULAR | Status: DC | PRN

## 2019-12-08 MED ORDER — LIDOCAINE HCL (PF) 1 % IJ SOLN
INTRAMUSCULAR | Status: DC | PRN
  Administered 2019-12-08: 4 mL

## 2019-12-08 MED ORDER — DARBEPOETIN ALFA 60 MCG/0.3ML IJ SOSY: 60.0000 ug | PREFILLED_SYRINGE | INTRAMUSCULAR | Status: DC

## 2019-12-08 MED ORDER — CEFAZOLIN SODIUM-DEXTROSE 2-3 GM-%(50ML) IV SOLR
INTRAVENOUS | Status: DC | PRN
  Administered 2019-12-08: 2 g via INTRAVENOUS

## 2019-12-08 MED ORDER — SODIUM CHLORIDE 0.9 % IV SOLN: INTRAVENOUS | Status: DC

## 2019-12-08 MED ORDER — SODIUM CHLORIDE (PF) 0.9 % IJ SOLN
INTRAMUSCULAR | Status: DC | PRN
  Administered 2019-12-08: 500 mL

## 2019-12-08 MED ORDER — FENTANYL CITRATE (PF) 100 MCG/2ML IJ SOLN
INTRAMUSCULAR | Status: DC | PRN
  Administered 2019-12-08: 50 ug via INTRAVENOUS
  Administered 2019-12-08: 25 ug via INTRAVENOUS

## 2019-12-08 MED ORDER — LIDOCAINE HCL (CARDIAC) PF 100 MG/5ML IV SOSY
PREFILLED_SYRINGE | INTRAVENOUS | Status: DC | PRN
  Administered 2019-12-08: 40 mg via INTRAVENOUS

## 2019-12-08 MED ORDER — LIDOCAINE HCL (PF) 1 % IJ SOLN
INTRAMUSCULAR | Status: AC
  Filled 2019-12-08: qty 30

## 2019-12-08 MED ORDER — FENTANYL CITRATE (PF) 250 MCG/5ML IJ SOLN
INTRAMUSCULAR | Status: AC
  Filled 2019-12-08: qty 5

## 2019-12-08 MED ORDER — FAMOTIDINE 40 MG PO TABS: 20.0000 mg | ORAL_TABLET | Freq: Every day | ORAL | 1 refills | Status: DC

## 2019-12-08 MED ORDER — KETAMINE HCL 50 MG/5ML IJ SOSY
PREFILLED_SYRINGE | INTRAMUSCULAR | Status: AC
  Filled 2019-12-08: qty 5

## 2019-12-08 MED ORDER — PHENYLEPHRINE 40 MCG/ML (10ML) SYRINGE FOR IV PUSH (FOR BLOOD PRESSURE SUPPORT)
PREFILLED_SYRINGE | INTRAVENOUS | Status: AC
  Filled 2019-12-08: qty 10

## 2019-12-08 MED ORDER — METOPROLOL TARTRATE 25 MG PO TABS: 25.0000 mg | ORAL_TABLET | Freq: Two times a day (BID) | ORAL | 1 refills | Status: DC

## 2019-12-08 MED ORDER — PHENYLEPHRINE HCL (PRESSORS) 10 MG/ML IV SOLN
INTRAVENOUS | Status: DC | PRN
Start: 2019-12-08 — End: 2019-12-08
  Administered 2019-12-08 (×4): 80 ug via INTRAVENOUS

## 2019-12-08 MED ORDER — PROPOFOL 10 MG/ML IV BOLUS
INTRAVENOUS | Status: DC | PRN
  Administered 2019-12-08: 200 mg via INTRAVENOUS

## 2019-12-08 MED ORDER — HEPARIN 1000 UNIT/ML FOR PERITONEAL DIALYSIS
INTRAMUSCULAR | Status: DC | PRN
  Administered 2019-12-08: 4 mL via INTRAPERITONEAL

## 2019-12-08 MED ORDER — HYDRALAZINE HCL 100 MG PO TABS: 100.0000 mg | ORAL_TABLET | Freq: Three times a day (TID) | ORAL | 1 refills | Status: DC

## 2019-12-08 MED ORDER — HEPARIN SODIUM (PORCINE) 1000 UNIT/ML IJ SOLN
INTRAMUSCULAR | Status: AC
  Filled 2019-12-08: qty 1

## 2019-12-08 MED ORDER — PROPOFOL 10 MG/ML IV BOLUS
INTRAVENOUS | Status: AC
  Filled 2019-12-08: qty 40

## 2019-12-08 SURGICAL SUPPLY — 52 items
ADH SKN CLS APL DERMABOND .7 (GAUZE/BANDAGES/DRESSINGS) ×1
APL PRP STRL LF ISPRP CHG 10.5 (MISCELLANEOUS) ×1
APPLICATOR CHLORAPREP 10.5 ORG (MISCELLANEOUS) ×3 IMPLANT
BAG DECANTER FOR FLEXI CONT (MISCELLANEOUS) ×3 IMPLANT
BIOPATCH RED 1 DISK 7.0 (GAUZE/BANDAGES/DRESSINGS) ×2 IMPLANT
BIOPATCH RED 1IN DISK 7.0MM (GAUZE/BANDAGES/DRESSINGS) ×1
CATH PALINDROME-P 23CM W/VT (CATHETERS) ×2 IMPLANT
COVER LIGHT HANDLE STERIS (MISCELLANEOUS) ×6 IMPLANT
COVER PROBE U/S 5X48 (MISCELLANEOUS) ×3 IMPLANT
COVER WAND RF STERILE (DRAPES) ×3 IMPLANT
DECANTER SPIKE VIAL GLASS SM (MISCELLANEOUS) ×4 IMPLANT
DERMABOND ADVANCED (GAUZE/BANDAGES/DRESSINGS) ×2
DERMABOND ADVANCED .7 DNX12 (GAUZE/BANDAGES/DRESSINGS) ×1 IMPLANT
DRAPE C-ARM FOLDED MOBILE STRL (DRAPES) ×3 IMPLANT
DRAPE CHEST BREAST 15X10 FENES (DRAPES) ×3 IMPLANT
DRAPE HALF SHEET 40X57 (DRAPES) ×2 IMPLANT
DRSG SORBAVIEW 3.5X5-5/16 MED (GAUZE/BANDAGES/DRESSINGS) ×3 IMPLANT
ELECT REM PT RETURN 9FT ADLT (ELECTROSURGICAL) ×3
ELECTRODE REM PT RTRN 9FT ADLT (ELECTROSURGICAL) ×1 IMPLANT
GAUZE 4X4 16PLY RFD (DISPOSABLE) ×3 IMPLANT
GLOVE BIO SURGEON STRL SZ 6.5 (GLOVE) ×2 IMPLANT
GLOVE BIO SURGEONS STRL SZ 6.5 (GLOVE) ×1
GLOVE BIOGEL PI IND STRL 6.5 (GLOVE) ×1 IMPLANT
GLOVE BIOGEL PI IND STRL 7.0 (GLOVE) ×2 IMPLANT
GLOVE BIOGEL PI INDICATOR 6.5 (GLOVE) ×2
GLOVE BIOGEL PI INDICATOR 7.0 (GLOVE) ×4
GLOVE ECLIPSE 6.5 STRL STRAW (GLOVE) ×2 IMPLANT
GOWN STRL REUS W/TWL LRG LVL3 (GOWN DISPOSABLE) ×6 IMPLANT
GUIDEWIRE STR DUAL SENSOR (WIRE) ×2 IMPLANT
IV CONNECTOR ONE LINK NDLESS (IV SETS) IMPLANT
IV NS 500ML (IV SOLUTION) ×3
IV NS 500ML BAXH (IV SOLUTION) ×1 IMPLANT
KIT BLADEGUARD II DBL (SET/KITS/TRAYS/PACK) ×3 IMPLANT
KIT CATH PALINDROME CHRONIC (CATHETERS) IMPLANT
KIT TURNOVER KIT A (KITS) ×3 IMPLANT
MARKER SKIN DUAL TIP RULER LAB (MISCELLANEOUS) ×3 IMPLANT
NDL HYPO 18GX1.5 BLUNT FILL (NEEDLE) ×1 IMPLANT
NDL HYPO 25X1 1.5 SAFETY (NEEDLE) ×1 IMPLANT
NEEDLE HYPO 18GX1.5 BLUNT FILL (NEEDLE) ×3 IMPLANT
NEEDLE HYPO 25X1 1.5 SAFETY (NEEDLE) ×3 IMPLANT
PACK BASIC III (CUSTOM PROCEDURE TRAY) ×3
PACK SRG BSC III STRL LF ECLPS (CUSTOM PROCEDURE TRAY) ×1 IMPLANT
PAD ARMBOARD 7.5X6 YLW CONV (MISCELLANEOUS) ×3 IMPLANT
PENCIL SMOKE EVACUATOR COATED (MISCELLANEOUS) ×3 IMPLANT
SET BASIN LINEN APH (SET/KITS/TRAYS/PACK) ×3 IMPLANT
SUT MNCRL AB 4-0 PS2 18 (SUTURE) ×3 IMPLANT
SUT SILK 2 0 FSL 18 (SUTURE) ×3 IMPLANT
SUT VIC AB 3-0 SH 27 (SUTURE) ×3
SUT VIC AB 3-0 SH 27X BRD (SUTURE) ×1 IMPLANT
SYR 20ML LL LF (SYRINGE) ×3 IMPLANT
SYR 5ML LL (SYRINGE) ×3 IMPLANT
SYR CONTROL 10ML LL (SYRINGE) ×3 IMPLANT

## 2019-12-08 NOTE — Discharge Summary (Signed)
Physician Discharge Summary  Randy Oneal SAY:[id number] DOB: 01/18/1966 DOA: 12/01/2019  PCP: [name], MD  Admit date: 12/01/2019 Discharge date: 12/08/2019  Time spent: 35 minutes  Recommendations for Outpatient Follow-up:  1. Repeat basic metabolic panel to follow electrolytes 2. Reassess blood pressure and further adjust antihypertensive regimen as needed 3. Close monitoring of patient's CBGs with further adjustment to hypoglycemic regimen as required 4. Make sure patient has follow-up with nephrology service and vascular service for permanent hemodialysis access.   Discharge Diagnoses:  Principal Problem:   Acute on chronic renal failure (HCC) Active Problems:   Type 2 diabetes mellitus not at goal [facility])   Essential hypertension   Hyperlipidemia associated with type 2 diabetes mellitus ([place])   Diabetic neuropathy ([place])   ESRD needing dialysis ([place])   Displacement of vascular dialysis catheter [facility])   Discharge Condition: Stable and improved.  Discharged home with instruction to follow-up with PCP and also with nephrology service with anticipated next dialysis treatment on 12/10/2019.  CODE STATUS: Full code.  Diet recommendation: Modified carbohydrate diet and low-sodium diet.  Filed Weights   12/07/19 0500 12/08/19 0259 12/08/19 1115  Weight: 89 kg 91.8 kg 86 kg    History of present illness:  As per H&P written by Dr. Scherrie November on 12/01/19 54 y.o. male with medical history significant of hypertension, diabetes mellitus type 2, retinopathy, neuropathy, chronic kidney disease who presented to the ER with worsening bilateral lower extremity swelling over the past 1 week.  Patient also reports having some dyspnea on exertion. Found to have uncontrolled blood pressure on presentation to the ER.  Follows with nephrology with Dr. [name] as outpatient.  He was supposed to have AV fistula placed for possible dialysis as outpatient on most recent visit in February.  However he  was unable to get that set up as he lost his insurance. No chest pain, palpitations, fever, chills, cough, nausea, vomiting, abdominal pain, diarrhea. ED Course:  Vital Signs reviewed on presentation, significant for temperature 98, heart rate 88, blood pressure 185/91, saturation 95% on room air. Labs reviewed, significant for sodium 138, potassium 4.2, BUN 77, creatinine 7.7, bicarb 19, BNP 212, WBC count 5.6, hemoglobin 8.8, hematocrit 28, platelets 178. Imaging personally Reviewed, chest x-ray shows no evidence of acute cardiopulmonary disease. EKG personally reviewed, shows sinus rhythm, no acute ST-T changes.  Hospital Course:  1)acute on chronic renal failure stage 4; now ESRD- -appreciate assistance and recommendations by nephrology service -General surgery helping with temporary tunneled catheter placement for dialysis. -Second HD session 12/06/2019 via [facility]; unfortunately treatment was inefficient in the setting of poor performance from catheter.  After reviewing with general surgery catheter appears to be in the azygous and need to be reposition/exchange.   -catheter exchanged on 4/29/2021and third Dialysis provided on 12/08/2019. No complications or problems seen. -Patient with secure outpatient HD chair for dialysis at [facility] starting on 12/10/2019. -Continue follow-up and treatment by nephrology as an outpatient.  2)HTN--- stable, continue amlodipine 10 mg daily, metoprolol 25 mg twice daily , hydralazine 100 mg 3 times daily -Low-sodium diet has been encouraged -Anticipate further improvement in his blood pressure with dialysis.  3)Anemia of CKD--hemoglobin 8.3--- Feraheme per nephrologist on 12/02/2019 -Defer decision on ESA/Epogen to nephrology team -No signs of overt bleeding appreciated.  4)DM2 with nephropathy--A1c is 8.8 reflecting uncontrolled DM PTA,  -Resume home sliding scale insulin and Lantus therapy. -Modified carbohydrate diet has been  encouraged. -Patient instructed to follow-up with PCP closely  to further adjust hypoglycemic regimen as required.  5) secondary hyper parathyroidism--PTH 339, managed by nephrology team, anticipate improvement with hemodialysis and the use of Renvela.  Procedures: -s/p  temporary HD catheter placement and initiation of hemodialysis on Monday, 12/05/2019 -HD treatment on 12/05/19, 12/06/2019 and 12/08/19 -Planning for next hemodialysis treatment on 5/1/2021as an outpatient now.   Consultations:  Nephrology  General surgery  Discharge Exam: Vitals:   12/08/19 1500 12/08/19 1505  BP: 138/82 (!) 142/81  Pulse: 82 84  Resp:  18  Temp:  98 F (36.7 C)  SpO2:     General exam: Alert, awake, oriented x 3; no chest pain, no nausea, no vomiting.  Patient is afebrile and reports significant improvement in his lower extremity swelling. Respiratory system: No wheezing, no crackles; normal respiratory effort. Cardiovascular system:RRR. No rubs or gallops.  Right IJ in place. Gastrointestinal system: Abdomen is nondistended, soft and nontender. No organomegaly or masses felt. Normal bowel sounds heard. Central nervous system: Alert and oriented. No focal neurological deficits. Extremities: No cyanosis or clubbing.  Trace edema bilaterally in his lower extremities appreciated. Skin: No rashes, no petechiae. Psychiatry: Judgement and insight appear normal. Mood & affect appropriate.    Discharge Instructions   Discharge Instructions    Diet - low sodium heart healthy   Complete by: As directed    Discharge instructions   Complete by: As directed    Take medications as prescribed HD on Saturday as instructed Follow up with PCP in 10 days Maintain adequate hydration -low sodium diet and modified carbohydrates diet.     Allergies as of 12/08/2019      Reactions   Tramadol Nausea And Vomiting      Medication List    STOP taking these medications   bacitracin-polymyxin b ophthalmic  ointment Commonly known as: POLYSPORIN   furosemide 40 MG tablet Commonly known as: LASIX   gatifloxacin 0.5 % Soln Commonly known as: ZYMAXID   Lokelma 10 g Pack packet Generic drug: sodium zirconium cyclosilicate   sildenafil 20 MG tablet Commonly known as: REVATIO   sodium bicarbonate 650 MG tablet     TAKE these medications   acetaminophen 500 MG tablet Commonly known as: TYLENOL Take 1,000 mg by mouth every 6 (six) hours as needed for moderate pain or headache.   amLODipine 10 MG tablet Commonly known as: NORVASC TAKE (1) TABLET BY MOUTH ONCE DAILY.   atorvastatin 10 MG tablet Commonly known as: LIPITOR TAKE ONE TABLET BY MOUTH ONCE DAILY.   brimonidine 0.2 % ophthalmic solution Commonly known as: ALPHAGAN Place 1 drop into both eyes daily.   calcitRIOL 0.25 MCG capsule Commonly known as: ROCALTROL Take 0.25 mcg by mouth 3 (three) times a week.   Darbepoetin Alfa 60 MCG/0.3ML Sosy injection Commonly known as: ARANESP Inject 0.3 mLs (60 mcg total) into the vein every Monday with hemodialysis. Start taking on: Dec 12, 2019   famotidine 40 MG tablet Commonly known as: PEPCID Take 0.5 tablets (20 mg total) by mouth at bedtime. What changed:   how much to take  how to take this  when to take this  additional instructions   [place] Use one sensor every 10 days.   GAS-X PO Take 1 tablet by mouth daily as needed (gas).   glucose blood test strip Commonly known as: Visual merchandiser Next Test USE TO TEST FOUR TIMES DAILY.   hydrALAZINE 100 MG tablet Commonly known as: APRESOLINE Take 1 tablet (100 mg  total) by mouth 3 (three) times daily. What changed:   medication strength  how much to take   insulin lispro 100 UNIT/ML KwikPen Commonly known as: HumaLOG KwikPen Inject 0.06 mLs (6 Units total) into the skin daily with breakfast AND 0.07 mLs (7 Units total) 2 (two) times daily before lunch and supper. What changed: See  the new instructions.   Insulin Pen Needle 32G X 4 MM Misc Commonly known as: BD Pen Needle Nano 2nd Gen Four times daily   metoprolol tartrate 25 MG tablet Commonly known as: LOPRESSOR Take 1 tablet (25 mg total) by mouth 2 (two) times daily. What changed:   medication strength  how much to take  how to take this  when to take this  additional instructions   Microlet Lancets Misc USE FOUR TIMES A DAY AS DIRECTED.   nystatin powder Commonly known as: MYCOSTATIN/NYSTOP Apply topically 2 (two) times daily. To groin and scrotum What changed:   how much to take  when to take this  reasons to take this   pantoprazole 40 MG tablet Commonly known as: Protonix Take 1 tablet (40 mg total) by mouth daily.   prednisoLONE acetate 1 % ophthalmic suspension Commonly known as: PRED FORTE Place 1 drop into both eyes 4 (four) times daily.   Prolensa 0.07 % Soln Generic drug: Bromfenac Sodium Place 1 drop into both eyes 4 (four) times daily.   [name] 100 UNIT/ML FlexTouch Pen Generic drug: insulin degludec Inject 0.19 mLs (19 Units total) into the skin at bedtime. What changed: how much to take   Vitamin D (Ergocalciferol) 1.25 MG (50000 UNIT) Caps capsule Commonly known as: DRISDOL Take 1.25 Units by mouth once a week.      Allergies  Allergen Reactions  . Tramadol Nausea And Vomiting   Follow-up Information    [facility]. Call.   Why: [phone] Kieth Brightly to complete consent paper work Dialysis chair time is Tues/ Thurs/ Sat  at 12:00       [place], [name], MD. Schedule an appointment as soon as possible for a visit in 10 day(s).   Specialty: Family Medicine Contact information: [address] [place] [address] [phone]           The results of significant diagnostics from this hospitalization (including imaging, microbiology, ancillary and laboratory) are listed below for reference.     Significant Diagnostic Studies: DG Chest 2 View  Result Date: 12/07/2019 CLINICAL DATA:  Dialysis catheter placed on 12/05/2019 EXAM: CHEST - 2 VIEW COMPARISON:  12/07/2019, 12/05/2019 FINDINGS: RIGHT jugular dual-lumen central venous catheter with tip directed posteriorly in azygos arch. Normal heart size, mediastinal contours, and pulmonary vascularity. Lungs clear. No infiltrate, pleural effusion or pneumothorax. Osseous structures unremarkable. IMPRESSION: RIGHT jugular dual-lumen catheter is directed posteriorly within the azygos arch. Clear lungs. Discussed with Dr. Constance Haw at time of interpretation. Electronically Signed   By: Lavonia Dana M.D.   On: 12/07/2019 14:26   DG Chest 2 View  Result Date: 12/01/2019 CLINICAL DATA:  Shortness of breath EXAM: CHEST - 2 VIEW COMPARISON:  CT 11/24/2018 FINDINGS: Heart and mediastinal contours are within normal limits. No focal opacities or effusions. No acute bony abnormality. IMPRESSION: No active cardiopulmonary disease. Electronically Signed   By: Rolm Baptise M.D.   On: 12/01/2019 01:38   DG Chest Port 1 View  Result Date: 12/08/2019 CLINICAL DATA:  Revision of dialysis catheter EXAM: PORTABLE CHEST 1 VIEW COMPARISON:  Portable exam 0903 hours compared to 12/07/2019 FINDINGS: Tip of RIGHT jugular dual lumen central venous catheter projects over SVC near cavoatrial junction. Normal heart size, mediastinal contours, and pulmonary vascularity. Lungs clear. No pulmonary infiltrate, pleural effusion, or pneumothorax. Osseous structures unremarkable. IMPRESSION: No pneumothorax following RIGHT jugular line placement. Tip of catheter projects over SVC near cavoatrial junction. Electronically Signed   By: Lavonia Dana M.D.   On: 12/08/2019 09:13   DG Chest Port 1 View  Result Date: 12/07/2019 CLINICAL DATA:  Dialysis catheter positioning EXAM: PORTABLE CHEST 1 VIEW COMPARISON:  December 05, 2019 FINDINGS: Dialysis catheter tip is near the junction of the  superior vena cava and azygos vein. The tip of the catheter potentially may reside within the azygos vein. No pneumothorax. Lungs clear. Heart size and pulmonary vascularity are normal. No adenopathy. No bone lesions. IMPRESSION: Central catheter tip is at the junction of the superior vena cava and azygos vein. The tip may reside within the azygos vein. Lateral view could be helpful for further assessment in this regard if further assessment is felt to be warranted. No pneumothorax. Lungs clear. Stable cardiac silhouette. Electronically Signed   By: Lowella Grip Oneal M.D.   On: 12/07/2019 09:06   DG Chest Port 1 View  Result Date: 12/05/2019 CLINICAL DATA:  Dialysis catheter placement. EXAM: PORTABLE CHEST 1 VIEW COMPARISON:  Earlier same day FINDINGS: Right internal jugular catheter has been placed. The tip is in the SVC at the azygos level, approximately 5 cm above the SVC RA junction. No pneumothorax. Heart size is normal. The lungs are clear. No edema. IMPRESSION: Right internal jugular central line tip in the SVC at the azygos level, 5 cm above the SVC RA junction. No pneumothorax. Electronically Signed   By: Nelson Chimes M.D.   On: 12/05/2019 13:42   DG C-Arm 1-60 Min-No Report  Result Date: 12/08/2019 Fluoroscopy was utilized by the requesting physician.  No radiographic interpretation.   DG C-Arm 1-60 Min-No Report  Result Date: 12/05/2019 Fluoroscopy was utilized by the requesting physician.  No radiographic interpretation.    Microbiology: Recent Results (from the past 240 hour(s))  Respiratory Panel by RT PCR (Flu A&B, Covid) - Nasopharyngeal Swab     Status: None   Collection Time: 12/01/19  2:31 AM   Specimen: Nasopharyngeal Swab  Result Value Ref Range Status   SARS Coronavirus 2 by RT PCR NEGATIVE NEGATIVE Final    Comment: (NOTE) SARS-CoV-2 target nucleic acids are NOT DETECTED. The SARS-CoV-2 RNA is generally detectable in upper respiratoy specimens during the acute  phase of infection. The lowest concentration of SARS-CoV-2 viral copies this assay can detect is 131 copies/mL. A negative result does not preclude SARS-Cov-2 infection and should not be used as the sole basis for treatment or other patient management decisions. A negative result may occur with  improper specimen collection/handling, submission of specimen other than nasopharyngeal swab, presence of viral mutation(s) within the areas targeted by this assay, and inadequate number of viral copies (<131 copies/mL). A negative result must be combined with clinical observations, patient history, and epidemiological information. The expected result is Negative. Fact Sheet for Patients:  PinkCheek.be Fact Sheet for Healthcare Providers:  GravelBags.it This test is not yet ap proved or cleared by the [place] FDA and  has been authorized for detection and/or diagnosis of SARS-CoV-2 by FDA under an Emergency Use Authorization (EUA). This EUA will remain  in effect (meaning this test can be used) for the duration  of the COVID-19 declaration under Section 564(b)(1) of the Act, 21 U.S.C. section 360bbb-3(b)(1), unless the authorization is terminated or revoked sooner.    Influenza A by PCR NEGATIVE NEGATIVE Final   Influenza B by PCR NEGATIVE NEGATIVE Final    Comment: (NOTE) The Xpert Xpress SARS-CoV-2/FLU/RSV assay is intended as an aid in  the diagnosis of influenza from Nasopharyngeal swab specimens and  should not be used as a sole basis for treatment. Nasal washings and  aspirates are unacceptable for Xpert Xpress SARS-CoV-2/FLU/RSV  testing. Fact Sheet for Patients: PinkCheek.be Fact Sheet for Healthcare Providers: GravelBags.it This test is not yet approved or cleared by the [place] FDA and  has been authorized for detection and/or diagnosis of SARS-CoV-2 by   FDA under an Emergency Use Authorization (EUA). This EUA will remain  in effect (meaning this test can be used) for the duration of the  Covid-19 declaration under Section 564(b)(1) of the Act, 21  U.S.C. section 360bbb-3(b)(1), unless the authorization is  terminated or revoked. Performed at [facility], [address], [place], [place] [address]   Surgical PCR screen     Status: Abnormal   Collection Time: 12/07/19 11:07 PM   Specimen: Nasal Mucosa; Nasal Swab  Result Value Ref Range Status   MRSA, PCR NEGATIVE NEGATIVE Final   Staphylococcus aureus POSITIVE (A) NEGATIVE Final    Comment: (NOTE) The Xpert SA Assay (FDA approved for NASAL specimens in patients 60 years of age and older), is one component of a comprehensive surveillance program. It is not intended to diagnose infection nor to guide or monitor treatment. Performed at [facility], [address] [place],  [address]      Labs: Basic Metabolic Panel: Recent Labs  Lab 12/02/19 0612 12/04/19 0628 12/05/19 0522 12/07/19 0553 12/08/19 1044  NA 140 140 140 139 137  K 4.1 3.8 3.8 3.9 4.3  CL 109 106 105 101 102  CO2 20* 23 24 27 24   GLUCOSE 97 123* 103* 160* 164*  BUN 85* 82* 85* 48* 61*  CREATININE 8.04* 8.50* 8.92* 6.52* 7.90*  CALCIUM 8.1* 8.5* 8.3* 8.4* 8.3*  PHOS 6.2* 6.3*  --  4.9* 5.4*   Liver Function Tests: Recent Labs  Lab 12/02/19 0612 12/04/19 0628 12/08/19 1044  ALBUMIN 2.4* 2.6* 2.8*   CBC: Recent Labs  Lab 12/02/19 0612 12/05/19 0522 12/07/19 0553  WBC 4.3 3.6* 4.1  NEUTROABS 2.4  --   --   HGB 7.6* 8.3* 9.1*  HCT 23.9* 26.3* 29.6*  MCV 86.3 85.1 85.8  PLT 164 176 120*   BNP (last 3 results) Recent Labs    11/30/19 1236  BNP 212.0*   CBG: Recent Labs  Lab 12/07/19 1618 12/07/19 2018 12/08/19 0906 12/08/19 1058 12/08/19 1624  GLUCAP 193* 200* 160* 146* 168*    Signed:  Barton Dubois MD.  [facility] 12/08/2019, 5:13 PM

## 2019-12-08 NOTE — Op Note (Signed)
Operative Note 12/08/19   Preoperative Diagnosis: Right Internal Jugular Tunneled Palindrome 23cm Catheter Malpositioned with tip in Azygos vein, End Stage Renal Disease    Postoperative Diagnosis: Same   Procedure(s) Performed: Complete Replacement Tunneled Dialysis Catheter Placement, Right Internal Jugular    Surgeon: Randy Matar. Constance Haw, MD   Assistants: No qualified resident was available   Anesthesia: General anesthesia   Anesthesiologist: Randy Killings, MD    Specimens: None   Estimated Blood Loss: Minimal   Fluoroscopy time: 64 seconds   Blood Replacement: None    Complications: None    Operative Findings: Prior catheter with tip in azygos vein; unable to get catheter to flip into place; attempted exchange over wire with VenaTrac but wire slipped out; complete replacement of tunneled catheter via new superior venotomy   Indications: Randy Oneal is a very sweet 54 yo with ESRD. I had placed a tunneled R IJ 23 cm Palindrome earlier this week, and dialysis was having issues with flows.  Attempts at checking for a kink at the neck level were inconclusive, and a lateral CXR demonstrated that the tip of the catheter was in the azygos vein.  We discussed attempted wiring to get the catheter to flip into place versus replacement of the dialysis catheter, and discussed the risk of bleeding, infection, malfunction, and pneumothorax or injury to the vessels. He opted to proceed.   Procedure: The patient was brought into the operating room and general anesthesia was induced.   The right chest and neck and entire catheter was prepped and draped in the usual sterile fashion.  Preoperative antibiotics were given.   Fluoroscopy was performed and demonstrated the curve into the Azygos vein.  A hydrophillic ZipWire (4.33IR diameter; 150cm length) was used to access the venous port after the port was cleaned with alcohol.  Under fluoroscopic guidance the wire was easily passed into the  catheter and out the end, but the catheter would not straighten out.  A stiffer Sensor Wire with Hydrophilic tip (5.18AC diameter, 150cm length) was placed through the venous port, and again the catheter did not change positions under fluoroscopic guidance.  I cut the silk sutures to the catheter at the skin level to see if I had any room to pull the catheter back into the tunnel before the cuff was exposed, but unfortunately the cuff came out, so the entire catheter would need to be replaced.  Under fluoroscopic guidance, the Sensor wire was removed and the ZipWire was replaced, and the catheter was completely removed.  In doing this the ZipWire did not remain in the vein, and came out unintentionally with the catheter.  Pressure was held at the level of the right internal jugular venotomy site for 10 minutes.  From here a new venotomy was going to need to be created, and I opted to go just superior to the prior site to prevent bleeding.   The dermabond from prior was removed and the suture was cut at the right neck site. An Ultrasound was used to verify that the right internal jugular vein was patent.  One percent lidocaine was used for local anesthesia.  A new 23 cm Palindrome dual lumen dialysis catheter was chosen.  The needles advanced into the right internal jugular vein using the Seldinger technique without difficulty.  A guidewire was then advanced into the right atrium under fluoroscopic guidance.  Ectopia was noted briefly and resolved.  The wire was secured.  The prior tunneled site was used to tunnel the  catheter into place.  The ultrasound again confirmed the wire was going into the vein only. Dilators were used over the wire to dilate the track.  An introducer and peel-away sheath were placed over the guidewire. The catheter was then inserted through the peel-away sheath and the peel-away sheath was removed.  A spot film was performed to confirm the position.  The catheter drew back and flushed  easily. A repeat spot film again confirmed good position at the [facility] /RA junction. The lumens were packed with heparin. Hemostats were used to position the catheter in the neck incision. The neck incision was closed with 4-0 Monocryl and Dermabond. The catheter was secured with 2-0 silk suture and a sterile Biopatch and dressing was applied.  Hemostasis was confirmed.     All tape and needle counts were correct at the end of the procedure. The patient was transferred to PACU in stable condition. A chest x-ray will be performed at that time.  Randy Labrum, MD [facility] [address] [place], [place] [address] [phone] (office)

## 2019-12-08 NOTE — Anesthesia Procedure Notes (Signed)
Procedure Name: LMA Insertion Date/Time: 12/08/2019 7:47 AM Performed by: Georgeanne Nim, CRNA Pre-anesthesia Checklist: Patient identified, Emergency Drugs available, Suction available, Patient being monitored and Timeout performed Patient Re-evaluated:Patient Re-evaluated prior to induction Oxygen Delivery Method: Circle system utilized Preoxygenation: Pre-oxygenation with 100% oxygen Induction Type: IV induction LMA: LMA inserted LMA Size: 4.0 Number of attempts: 1 Placement Confirmation: positive ETCO2,  CO2 detector and breath sounds checked- equal and bilateral Tube secured with: Tape Dental Injury: Teeth and Oropharynx as per pre-operative assessment

## 2019-12-08 NOTE — Progress Notes (Addendum)
Patient ID: Randy Oneal, male   DOB: 09/23/1965, 54 y.o.   MRN: [id number]  [facility] Progress Note    Subjective:   Feeling well and tolerated TDC placement without issue.   Objective:   BP (!) 162/90 (BP Location: Left Arm)   Pulse 72   Temp 98.6 F (37 C)   Resp 18   Ht 6' (1.829 m)   Wt 91.8 kg   SpO2 97%   BMI 27.45 kg/m   Intake/Output: I/O last 3 completed shifts: In: 1200 [P.O.:1200] Out: 3273 [Urine:2250; Other:1023]   Intake/Output this shift:  Total I/O In: 300 [I.V.:300] Out: 10 [Blood:10] Weight change: 2.85 kg  Physical Exam: Gen: NAD CVS: no rub Resp: cta Abd: +BS, soft, NT/ND Ext: no edema  Labs: BMET Recent Labs  Lab 12/02/19 0612 12/04/19 0628 12/05/19 0522 12/07/19 0553  NA 140 140 140 139  K 4.1 3.8 3.8 3.9  CL 109 106 105 101  CO2 20* 23 24 27   GLUCOSE 97 123* 103* 160*  BUN 85* 82* 85* 48*  CREATININE 8.04* 8.50* 8.92* 6.52*  ALBUMIN 2.4* 2.6*  --   --   CALCIUM 8.1* 8.5* 8.3* 8.4*  PHOS 6.2* 6.3*  --  4.9*   CBC Recent Labs  Lab 12/02/19 0612 12/05/19 0522 12/07/19 0553  WBC 4.3 3.6* 4.1  NEUTROABS 2.4  --   --   HGB 7.6* 8.3* 9.1*  HCT 23.9* 26.3* 29.6*  MCV 86.3 85.1 85.8  PLT 164 176 120*      Medications:    . amLODipine  10 mg Oral Daily  . atorvastatin  10 mg Oral Daily  . calcitRIOL  0.25 mcg Oral Once per day on Mon Wed Fri  . Chlorhexidine Gluconate Cloth  6 each Topical Daily  . Chlorhexidine Gluconate Cloth  6 each Topical Q0600  . Chlorhexidine Gluconate Cloth  6 each Topical Q0600  . darbepoetin (ARANESP) injection - DIALYSIS  60 mcg Intravenous Q Mon-HD  . hydrALAZINE  100 mg Oral TID  . insulin aspart  0-6 Units Subcutaneous TID WC  . insulin glargine  20 Units Subcutaneous QHS  . metoprolol tartrate  25 mg Oral BID  . pantoprazole  40 mg Oral Daily  . sodium chloride flush  3 mL Intravenous Q12H     Assessment/ Plan:   1. Vascular access- appreciate Dr. Constance Haw  assistance with replacement of RIJ TDC due to poorly functioning catheter.  He will also need an AVF/AVG and if he will remain in the hospital pending outpatient HD, it would be advantageous to transfer to [facility] for vein mapping and surgery prior to discharge.  Will discuss with primary. 2. Volume overload- markedly improved with diuresis and HD/UF. 3. New ESRD- s/p 2 sessions of HD but the second was interrupted with poor bfr due to kink in catheter.  Plan for 3rd HD today with new TDC.  Still makes significant amount of urine.  Will follow Scr between HD sessions.  1. Plan for HD again on Saturday. 4. Anemia: started on ESA 5. CKD-MBD: stable  6. Nutrition: renal diet 7. Hypertension: stable and UF with HD as tolerated 8. Disposition- awaiting outpatient HD to be arranged.  Since he does not have insurance this may take a while pending financial clearance.  If so would recommend AVF/AVG placement at [facility] and then transfer back here (if outpatient HD won't be arranged until next week). Pt has been accepted at [facility] and will start  HD on Saturday.  Will arrange for vascular access placement as an outpatient.   Donetta Potts, MD [place] Pager [phone] 12/08/2019, 10:11 AM

## 2019-12-08 NOTE — Anesthesia Preprocedure Evaluation (Addendum)
Anesthesia Evaluation  Patient identified by MRN, date of birth, ID band Patient awake    Reviewed: Allergy & Precautions, NPO status , Patient's Chart, lab work & pertinent test results  Airway Mallampati: III  TM Distance: >3 FB Neck ROM: Full    Dental  (+) Teeth Intact, Dental Advisory Given   Pulmonary neg pulmonary ROS,    Pulmonary exam normal breath sounds clear to auscultation       Cardiovascular hypertension, Pt. on medications Normal cardiovascular exam Rhythm:Regular Rate:Normal - Systolic murmurs, - Diastolic murmurs, - Friction Rub, - Carotid Bruit, - Peripheral Edema and - Systolic Click [id number] 40:35:24 [facility]-ER ROUTINE RECORD Sinus rhythm Probable left atrial enlargement Peaked T waves, also present on last EKG 15 Feb 2019 No significant change since last tracing 15 Feb 2019 Confirmed by Rolland Porter [phone]) on 12/01/2019 1:35:05 AM   Neuro/Psych negative neurological ROS  negative psych ROS   GI/Hepatic negative GI ROS, Neg liver ROS,   Endo/Other  diabetes, Well Controlled, Type 2, Insulin Dependent  Renal/GU ESRFRenal disease     Musculoskeletal  (+) Arthritis ,   Abdominal   Peds  Hematology   Anesthesia Other Findings   Reproductive/Obstetrics                             Anesthesia Physical  Anesthesia Plan  ASA: IV  Anesthesia Plan: General   Post-op Pain Management:    Induction: Intravenous  PONV Risk Score and Plan: 3 and Ondansetron and Midazolam  Airway Management Planned: LMA  Additional Equipment:   Intra-op Plan:   Post-operative Plan:   Informed Consent: I have reviewed the patients History and Physical, chart, labs and discussed the procedure including the risks, benefits and alternatives for the proposed anesthesia with the patient or authorized representative who has indicated his/her understanding and acceptance.      Dental advisory given  Plan Discussed with: CRNA and Surgeon  Anesthesia Plan Comments:        Anesthesia Quick Evaluation

## 2019-12-08 NOTE — H&P (Signed)
[facility]  Malpositioned right IJ tunneled dialysis catheter, needing repositioning versus exchange. Discussed with patient and discussed risk of bleeding, infection, pneumothorax, injury to vessels. Plan for dialysis later today to make sure catheter works better.  Patient expressed understanding.  BP (!) 179/99   Pulse 74   Temp 98.3 F (36.8 C) (Oral)   Resp 18   Ht 6' (1.829 m)   Wt 91.8 kg   SpO2 97%   BMI 27.45 kg/m   R IJ tunneled catheter in place.   Curlene Labrum, MD [facility] [address] [place], [place] [address] [phone] (office)

## 2019-12-08 NOTE — Progress Notes (Signed)
Dr. Darrick Meigs made aware of surgical PCR results.

## 2019-12-08 NOTE — Progress Notes (Signed)
Patient stated that it wouldn't be until the morning until his ride could pick him up. This RN spoke with patient to call his ride to pick him up this evening - case manager and MD spoke with him that he would be discharged after dialysis today. Patient stated he would call his ride at this time.

## 2019-12-08 NOTE — TOC Transition Note (Signed)
Transition of Care [facility]) - CM/SW Discharge Note   Patient Details  Name: Randy Oneal MRN: [id number] Date of Birth: [birth year]  Transition of Care [facility]) CM/SW Contact:  Boneta Lucks, RN Phone Number: 12/08/2019, 11:06 AM   Clinical Narrative:   Eddie North with [facility] left voice mail that patient has been Financially cleared. TOC called [employer] to update. Patient got new cath today, plan for dialysis today and discharge home.  [employer] gave chair time at 12:00 TTS. They need patient to call and stop by on Friday. TOC took patient Penny's number at [facility] to call to complete consent paperwork.  TOC provided [employer] with patient cell number. Provided [employer] with rounding nephrology note from this morning, she will reach out for orders.  [employer] researcher, broadcasting/film/video updated with plan.     Final next level of care: Home/Self Care Barriers to Discharge: Barriers Resolved   Patient Goals and CMS Choice Patient states their goals for this hospitalization and ongoing recovery are:: to go home. CMS Medicare.gov Compare Post Acute Care list provided to:: Patient Choice offered to / list presented to : Patient  Discharge Placement                Patient to be transferred to facility by: wife or daughter Name of family member notified: patient Patient and family notified of of transfer: 12/08/19  Discharge Plan and Services

## 2019-12-08 NOTE — Transfer of Care (Signed)
Immediate Anesthesia Transfer of Care Note  Patient: Randy Oneal  Procedure(s) Performed: INSERTION OF DIALYSIS CATHETER EXCHANGE (Right )  Patient Location: PACU  Anesthesia Type:General  Level of Consciousness: awake, alert , oriented and patient cooperative  Airway & Oxygen Therapy: Patient Spontanous Breathing  Post-op Assessment: Report given to RN and Post -op Vital signs reviewed and stable  Post vital signs: Reviewed and stable  Last Vitals:  Vitals Value Taken Time  BP 155/89 12/08/19 0900  Temp    Pulse 75 12/08/19 0902  Resp 18 12/08/19 0902  SpO2 93 % 12/08/19 0902  Vitals shown include unvalidated device data.  Last Pain:  Vitals:   12/08/19 0645  TempSrc: Oral  PainSc: 0-No pain      Patients Stated Pain Goal: 6 (71/69/67 [id number])  Complications: No apparent anesthesia complications

## 2019-12-08 NOTE — Procedures (Signed)
     HEMODIALYSIS TREATMENT NOTE (HD#3):  S/p RIJ TDC exchange.  Both limbs aspirated and flushed without resistance.  Tolerated prescribed Qb 400cc/m with stable venous and arterial pressures.    3.5 hour low-heparin tx completed without problems.  Goal met: 2 liters removed without interruption in UF.  All blood was returned.  Angela Poteat, RN 

## 2019-12-08 NOTE — Progress Notes (Addendum)
[facility]  Replacement catheter performed. CXR in good position and had good backflow and flush on both ports. Dialysis RN and Dr. Dyann Kief made aware.  Notified Ms. Wendorff of the replacement.   Curlene Labrum, MD [facility] [address] [place], [place] [address] [phone] (office)

## 2019-12-08 NOTE — Progress Notes (Signed)
Patient discharged home with wife via w/c transported by family vehicle. Patient is alert and oriented. No c/o any discomfort. IV removed. 2x2 gauzed applied. Went of discharge instructions and follow up appointments with patient. Telemetry box return to nursing station.

## 2019-12-08 NOTE — Anesthesia Postprocedure Evaluation (Signed)
Anesthesia Post Note  Patient: Randy Oneal  Procedure(s) Performed: INSERTION OF DIALYSIS CATHETER EXCHANGE (Right )  Patient location during evaluation: PACU Anesthesia Type: General Level of consciousness: awake and alert Pain management: pain level controlled Vital Signs Assessment: post-procedure vital signs reviewed and stable Respiratory status: spontaneous breathing Cardiovascular status: stable Postop Assessment: no apparent nausea or vomiting Anesthetic complications: no     Last Vitals:  Vitals:   12/08/19 1230 12/08/19 1300  BP: (!) 141/86 140/79  Pulse: 76 78  Resp:    Temp:    SpO2:      Last Pain:  Vitals:   12/08/19 1115  TempSrc: Oral  PainSc: 0-No pain                 [name]

## 2019-12-09 DIAGNOSIS — N2581 Secondary hyperparathyroidism of renal origin: Secondary | ICD-10-CM | POA: Insufficient documentation

## 2019-12-09 DIAGNOSIS — L299 Pruritus, unspecified: Secondary | ICD-10-CM | POA: Insufficient documentation

## 2019-12-09 DIAGNOSIS — R197 Diarrhea, unspecified: Secondary | ICD-10-CM | POA: Insufficient documentation

## 2019-12-09 DIAGNOSIS — R0600 Dyspnea, unspecified: Secondary | ICD-10-CM | POA: Insufficient documentation

## 2019-12-09 DIAGNOSIS — R52 Pain, unspecified: Secondary | ICD-10-CM | POA: Insufficient documentation

## 2019-12-09 DIAGNOSIS — D509 Iron deficiency anemia, unspecified: Secondary | ICD-10-CM | POA: Insufficient documentation

## 2019-12-09 DIAGNOSIS — D689 Coagulation defect, unspecified: Secondary | ICD-10-CM | POA: Insufficient documentation

## 2019-12-10 DIAGNOSIS — E039 Hypothyroidism, unspecified: Secondary | ICD-10-CM | POA: Insufficient documentation

## 2019-12-13 ENCOUNTER — Telehealth: Payer: Self-pay | Admitting: Family Medicine

## 2019-12-13 NOTE — Telephone Encounter (Signed)
Nurse's-patient recently discharged from the hospital. Please call patient, let them know that we are aware that they were discharged from the hospital. Please schedule them to follow-up with [place] within the next 7 days. Advised the patient to bring all of their medications with him to the visit. Please inquire if they are having any acute issues currently and documented accordingly.  It should be noted that this patient was visited and talk to while in the hospital and he understands to follow-up with [place] upon discharge he has an appointment on 12/14/2019

## 2019-12-14 ENCOUNTER — Encounter: Payer: Self-pay | Admitting: Family Medicine

## 2019-12-14 ENCOUNTER — Other Ambulatory Visit: Payer: Self-pay

## 2019-12-14 ENCOUNTER — Ambulatory Visit (INDEPENDENT_AMBULATORY_CARE_PROVIDER_SITE_OTHER): Payer: Self-pay | Admitting: Family Medicine

## 2019-12-14 VITALS — BP 108/72 | Temp 98.2°F | Ht 72.0 in | Wt 182.0 lb

## 2019-12-14 DIAGNOSIS — M542 Cervicalgia: Secondary | ICD-10-CM

## 2019-12-14 DIAGNOSIS — I959 Hypotension, unspecified: Secondary | ICD-10-CM

## 2019-12-14 DIAGNOSIS — E1165 Type 2 diabetes mellitus with hyperglycemia: Secondary | ICD-10-CM

## 2019-12-14 MED ORDER — AMLODIPINE BESYLATE 5 MG PO TABS
5.0000 mg | ORAL_TABLET | Freq: Every morning | ORAL | 5 refills | Status: DC
Start: 2019-12-14 — End: 2020-02-22

## 2019-12-14 NOTE — Progress Notes (Signed)
   Subjective:    Patient ID: Randy Oneal, male    DOB: 1965-10-23, 54 y.o.   MRN: [id number] Hospitalization transitional follow-up HPIFollow up on swelling in legs. Is swelling still much better He brings in his medications today we reviewed over what he is taking plus also reviewed over the medicines that have been stopped All questions were answered regarding his medication  Neck pain. Started while in the hospital.  To some degree this is positional with some stiffness and soreness does not radiate down to the arms Light headed. Started while in hospital.  He does find himself get lightheaded when he stands up and feels weak and woozy like he is going to pass out He gets dialysis 3 times a week and he states after dialysis it is actually worse  Review of Systems  Constitutional: Negative for activity change.  HENT: Negative for congestion and rhinorrhea.   Respiratory: Negative for cough and shortness of breath.   Cardiovascular: Negative for chest pain.  Gastrointestinal: Negative for abdominal pain, diarrhea, nausea and vomiting.  Genitourinary: Negative for dysuria and hematuria.  Neurological: Negative for weakness and headaches.  Psychiatric/Behavioral: Negative for behavioral problems and confusion.       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  This patient will never be able to go back to work he is disabled I support his disability. Q we did reduce his amlodipine. As for the neck pain we will set him up for plain x-rays of the neck Patient was encouraged to follow-up with [place] immediately should he have any setbacks and keep on doing his dialysis.  Warning signs of hypertension discussed Patient is disabled we will work on the letter for him

## 2019-12-15 ENCOUNTER — Telehealth: Payer: Self-pay | Admitting: Family Medicine

## 2019-12-15 NOTE — Progress Notes (Signed)
Tried to call no answer

## 2019-12-15 NOTE — Telephone Encounter (Signed)
Per Dr. Quin Hoop office [facility] [phone])  - referral not required, pt needs to call their office to schedule appointment  Tried to call to notify pt - voicemail full on cell# & no voicemail set up on home#

## 2019-12-17 ENCOUNTER — Other Ambulatory Visit: Payer: Self-pay | Admitting: Family Medicine

## 2019-12-20 NOTE — Progress Notes (Signed)
Xrays ordered. Attempted to call patient but patient does not have voicemail set up at this time.

## 2019-12-20 NOTE — Addendum Note (Signed)
Addended by: Vicente Males on: 12/20/2019 01:23 PM   Modules accepted: Orders

## 2019-12-26 DIAGNOSIS — Z029 Encounter for administrative examinations, unspecified: Secondary | ICD-10-CM

## 2019-12-27 ENCOUNTER — Other Ambulatory Visit: Payer: Self-pay

## 2019-12-27 ENCOUNTER — Emergency Department (HOSPITAL_COMMUNITY)
Admission: EM | Admit: 2019-12-27 | Discharge: 2019-12-27 | Disposition: A | Discharge status: Home / Self Care | Payer: Self-pay | Attending: Emergency Medicine | Admitting: Emergency Medicine

## 2019-12-27 ENCOUNTER — Encounter (HOSPITAL_COMMUNITY): Payer: Self-pay | Admitting: Emergency Medicine

## 2019-12-27 DIAGNOSIS — E1122 Type 2 diabetes mellitus with diabetic chronic kidney disease: Secondary | ICD-10-CM | POA: Insufficient documentation

## 2019-12-27 DIAGNOSIS — I120 Hypertensive chronic kidney disease with stage 5 chronic kidney disease or end stage renal disease: Secondary | ICD-10-CM | POA: Insufficient documentation

## 2019-12-27 DIAGNOSIS — S0083XA Contusion of other part of head, initial encounter: Secondary | ICD-10-CM | POA: Insufficient documentation

## 2019-12-27 DIAGNOSIS — Z79899 Other long term (current) drug therapy: Secondary | ICD-10-CM | POA: Insufficient documentation

## 2019-12-27 DIAGNOSIS — Z992 Dependence on renal dialysis: Secondary | ICD-10-CM | POA: Insufficient documentation

## 2019-12-27 DIAGNOSIS — Y93I9 Activity, other involving external motion: Secondary | ICD-10-CM | POA: Insufficient documentation

## 2019-12-27 DIAGNOSIS — Y999 Unspecified external cause status: Secondary | ICD-10-CM | POA: Insufficient documentation

## 2019-12-27 DIAGNOSIS — Z794 Long term (current) use of insulin: Secondary | ICD-10-CM | POA: Insufficient documentation

## 2019-12-27 DIAGNOSIS — Y92410 Unspecified street and highway as the place of occurrence of the external cause: Secondary | ICD-10-CM | POA: Insufficient documentation

## 2019-12-27 DIAGNOSIS — N186 End stage renal disease: Secondary | ICD-10-CM | POA: Insufficient documentation
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
## 2019-12-27 NOTE — Discharge Instructions (Signed)
Return if any problems.
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
## 2019-12-27 NOTE — Progress Notes (Signed)
Patient notified and stated he would go have xrays done.

## 2020-01-25 ENCOUNTER — Other Ambulatory Visit: Payer: Self-pay | Admitting: *Deleted

## 2020-01-25 ENCOUNTER — Other Ambulatory Visit: Payer: Self-pay

## 2020-01-25 ENCOUNTER — Ambulatory Visit (INDEPENDENT_AMBULATORY_CARE_PROVIDER_SITE_OTHER): Payer: Self-pay | Admitting: Family Medicine

## 2020-01-25 VITALS — BP 110/74 | Temp 98.2°F | Ht 72.0 in | Wt 173.8 lb

## 2020-01-25 DIAGNOSIS — E1169 Type 2 diabetes mellitus with other specified complication: Secondary | ICD-10-CM

## 2020-01-25 DIAGNOSIS — E785 Hyperlipidemia, unspecified: Secondary | ICD-10-CM

## 2020-01-25 DIAGNOSIS — E119 Type 2 diabetes mellitus without complications: Secondary | ICD-10-CM

## 2020-01-25 DIAGNOSIS — N186 End stage renal disease: Secondary | ICD-10-CM

## 2020-01-25 DIAGNOSIS — I10 Essential (primary) hypertension: Secondary | ICD-10-CM

## 2020-01-25 MED ORDER — ATORVASTATIN CALCIUM 10 MG PO TABS: 10.0000 mg | ORAL_TABLET | Freq: Every day | ORAL | 5 refills | Status: DC

## 2020-01-25 NOTE — Progress Notes (Signed)
   Subjective:    Patient ID: Randy Oneal, male    DOB: October 08, 1965, 54 y.o.   MRN: [id number]  HPI  Patient arrives for a follow up on hypotension. Patient states he is feeling better.  Patient for follow-up.  He states he feels very drained after getting his dialysis and gets lightheaded.  No passing out.  Patient has renal failure.  Also has significant osteoarthritis back issues walks with a cane Review of Systems  Constitutional: Negative for diaphoresis and fatigue.  HENT: Negative for congestion and rhinorrhea.   Respiratory: Negative for cough and shortness of breath.   Cardiovascular: Negative for chest pain and leg swelling.  Gastrointestinal: Negative for abdominal pain and diarrhea.  Skin: Negative for color change and rash.  Neurological: Negative for dizziness and headaches.  Psychiatric/Behavioral: Negative for behavioral problems and confusion.       Objective:   Physical Exam Vitals reviewed.  Constitutional:      General: He is not in acute distress. HENT:     Head: Normocephalic and atraumatic.  Eyes:     General:        Right eye: No discharge.        Left eye: No discharge.  Neck:     Trachea: No tracheal deviation.  Cardiovascular:     Rate and Rhythm: Normal rate and regular rhythm.     Heart sounds: Normal heart sounds. No murmur heard.   Pulmonary:     Effort: Pulmonary effort is normal. No respiratory distress.     Breath sounds: Normal breath sounds.  Lymphadenopathy:     Cervical: No cervical adenopathy.  Skin:    General: Skin is warm and dry.  Neurological:     Mental Status: He is alert.     Coordination: Coordination normal.  Psychiatric:        Behavior: Behavior normal.    Patient would be unable to do a sedentary job or a physical job because of his physical limitations as well as the need to lay down frequently because of the effects of dialysis  Blood pressure 110/70 sitting 90/60 standing and 70/60 after walking       Assessment & Plan:  This patient is disabled.  There is absolutely no way he can do a standard or regular work job.  This patient would benefit from being on Social Security disability.  We support this 100%.  He is permanently disabled.  1. Essential hypertension Blood pressure actually under 2 good control stop amlodipine.  He is having hypotension issues  2. Hyperlipidemia associated with type 2 diabetes mellitus ([place]) Continue cholesterol medicine we will check lipid profile in the fall  3. Type 2 diabetes mellitus not at goal [facility]) Patient has been referred to endocrinology has not heard anything from them yet so therefore we will refer again - Ambulatory referral to Endocrinology

## 2020-01-25 NOTE — Telephone Encounter (Signed)
Called pt, explained that a referral is not needed per [facility] Endocrinology due to he is an existing, gave # to call - pt verbalized understanding

## 2020-02-01 ENCOUNTER — Ambulatory Visit (INDEPENDENT_AMBULATORY_CARE_PROVIDER_SITE_OTHER): Payer: No Typology Code available for payment source | Admitting: Podiatry

## 2020-02-01 ENCOUNTER — Encounter: Payer: Self-pay | Admitting: Podiatry

## 2020-02-01 ENCOUNTER — Other Ambulatory Visit: Payer: Self-pay

## 2020-02-01 DIAGNOSIS — E1142 Type 2 diabetes mellitus with diabetic polyneuropathy: Secondary | ICD-10-CM

## 2020-02-01 DIAGNOSIS — L84 Corns and callosities: Secondary | ICD-10-CM

## 2020-02-01 NOTE — Progress Notes (Signed)
Subjective:  Patient ID: Randy Oneal, male    DOB: 07-11-1966,  MRN: [id number]  Chief Complaint  Patient presents with  . Foot Pain    pt is here for a diabetic pre-ulcerative callus of the right big toe, pt states that the right big toe needs to be chiseled down    54 y.o. male presents with the above complaint.  Patient presents with right hallux IPJ hyperkeratotic ptotic preulcerative lesion.  Patient states that it has been present for quite some time.  He has not had it treated before.  He is a type II diabetic with unknown A1c.  Patient states he is not on a blood thinner.  He just wants to make sure that there is no ulceration.  He does not abuse any body parts.  He denies any other acute complaints.  He has not tried any dressing changes has not tried softening the callus.   Review of Systems: Negative except as noted in the HPI. Denies N/V/F/Ch.  Past Medical History:  Diagnosis Date  . Diabetes mellitus    Type 2   . Diabetic retinopathy of both eyes ([place]) 01/31/2019   Dr Coralyn Pear 01/2019  . Herpes simplex virus (HSV) infection    OU  . Hypertension   . Renal insufficiency    ckd stage 4  . Retinal detachment    OS  . Sepsis due to Streptococcus, group B ([place]) 10/05/2018    Current Outpatient Medications:  .  acetaminophen (TYLENOL) 500 MG tablet, Take 1,000 mg by mouth every 6 (six) hours as needed for moderate pain or headache., Disp: , Rfl:  .  amLODipine (NORVASC) 5 MG tablet, Take 1 tablet (5 mg total) by mouth in the morning., Disp: 30 tablet, Rfl: 5 .  atorvastatin (LIPITOR) 10 MG tablet, Take 1 tablet (10 mg total) by mouth daily., Disp: 30 tablet, Rfl: 5 .  brimonidine (ALPHAGAN) 0.2 % ophthalmic solution, Place 1 drop into both eyes daily., Disp: 10 mL, Rfl: 10 .  Bromfenac Sodium (PROLENSA) 0.07 % SOLN, Place 1 drop into both eyes 4 (four) times daily., Disp: 3 mL, Rfl: 10 .  calcitRIOL (ROCALTROL) 0.25 MCG capsule, Take 0.25 mcg by mouth 3 (three) times a  week., Disp: , Rfl:  .  Continuous Blood Gluc Sensor ([place]) MISC, Use one sensor every 10 days., Disp: 3 each, Rfl: 2 .  Darbepoetin Alfa (ARANESP) 60 MCG/0.3ML SOSY injection, Inject 0.3 mLs (60 mcg total) into the vein every Monday with hemodialysis., Disp: , Rfl:  .  famotidine (PEPCID) 40 MG tablet, Take 0.5 tablets (20 mg total) by mouth at bedtime., Disp: 30 tablet, Rfl: 1 .  hydrALAZINE (APRESOLINE) 100 MG tablet, Take 1 tablet (100 mg total) by mouth 3 (three) times daily., Disp: 90 tablet, Rfl: 1 .  insulin lispro (HUMALOG KWIKPEN) 100 UNIT/ML KwikPen, Inject 0.06 mLs (6 Units total) into the skin daily with breakfast AND 0.07 mLs (7 Units total) 2 (two) times daily before lunch and supper. (Patient taking differently: inject 5-9 unit with each meal), Disp: 15 mL, Rfl: 11 .  Insulin Pen Needle (BD PEN NEEDLE NANO 2ND GEN) 32G X 4 MM MISC, Four times daily, Disp: 150 each, Rfl: 11 .  metoprolol tartrate (LOPRESSOR) 25 MG tablet, Take 1 tablet (25 mg total) by mouth 2 (two) times daily., Disp: 60 tablet, Rfl: 1 .  MICROLET LANCETS MISC, USE FOUR TIMES A DAY AS DIRECTED., Disp: 100 each, Rfl: 0 .  nystatin (MYCOSTATIN/NYSTOP) powder, Apply topically 2 (two) times daily. To groin and scrotum (Patient taking differently: Apply 1 g topically 2 (two) times daily as needed (rash). To groin and scrotum), Disp: 15 g, Rfl: 0 .  ONETOUCH ULTRA test strip, USE TO TEST 4 TIMES DAILY., Disp: 100 strip, Rfl: 1 .  prednisoLONE acetate (PRED FORTE) 1 % ophthalmic suspension, Place 1 drop into both eyes 4 (four) times daily., Disp: 15 mL, Rfl: 3 .  Simethicone (GAS-X PO), Take 1 tablet by mouth daily as needed (gas)., Disp: , Rfl:  .  TRESIBA FLEXTOUCH 100 UNIT/ML SOPN FlexTouch Pen, Inject 0.19 mLs (19 Units total) into the skin at bedtime. (Patient taking differently: Inject 22 Units into the skin at bedtime. ), Disp: 15 mL, Rfl: 1 .  pantoprazole (PROTONIX) 40 MG tablet, Take 1 tablet  (40 mg total) by mouth daily., Disp: 30 tablet, Rfl: 4  Social History   Tobacco Use  Smoking Status Never Smoker  Smokeless Tobacco Never Used    Allergies  Allergen Reactions  . Tramadol Nausea And Vomiting   Objective:  There were no vitals filed for this visit. There is no height or weight on file to calculate BMI. Constitutional Well developed. Well nourished.  Vascular Dorsalis pedis pulses palpable bilaterally. Posterior tibial pulses palpable bilaterally. Capillary refill normal to all digits.  No cyanosis or clubbing noted. Pedal hair growth normal.  Neurologic Normal speech. Oriented to person, place, and time. Epicritic sensation to light touch grossly present bilaterally.  Dermatologic  right hallux IPJ hyperkeratotic lesion without any pinpoint bleeding.  Pressure/blood blister noted when debriding.  No pinpoint bleeding noted.  Breakdown of the epidermal layer noted.  No exposure wound noted.  No clinical signs of infection noted.  Orthopedic: Normal joint ROM without pain or crepitus bilaterally. No visible deformities. No bony tenderness.   Radiographs: None Assessment:   1. Diabetic polyneuropathy associated with type 2 diabetes mellitus ([place])   2. Pre-ulcerative calluses    Plan:  Patient was evaluated and treated and all questions answered.  Right hallux IPJ preulcerative callus/lesion -I explained to the patient the etiology of lesion various treatment options were discussed.  Given that patient is a diabetic he is a high risk of developing ulceration with a possible infection.  He states his sugars have been somewhat controlled but have not been back controlled.  For now patient only has a preulcerative lesion however I believe patient will benefit from offloading as well as doing some dressing changes to prevent the fragile underlying epidermal layer from breaking down. -Patient agrees with the plan will continue Betadine wet-to-dry dressing -I will  see him back again in 2 weeks.  If there is no improvement will need to consider aggressive debridement.  No follow-ups on file.

## 2020-02-03 ENCOUNTER — Encounter: Payer: Self-pay | Admitting: Vascular Surgery

## 2020-02-03 ENCOUNTER — Ambulatory Visit (INDEPENDENT_AMBULATORY_CARE_PROVIDER_SITE_OTHER)
Admission: RE | Admit: 2020-02-03 | Discharge: 2020-02-03 | Disposition: A | Discharge status: Home / Self Care | Payer: Self-pay | Source: Ambulatory Visit | Attending: Vascular Surgery | Admitting: Vascular Surgery

## 2020-02-03 ENCOUNTER — Ambulatory Visit (HOSPITAL_COMMUNITY)
Admission: RE | Admit: 2020-02-03 | Discharge: 2020-02-03 | Disposition: A | Discharge status: Home / Self Care | Payer: Self-pay | Source: Ambulatory Visit | Attending: Vascular Surgery | Admitting: Vascular Surgery

## 2020-02-03 ENCOUNTER — Other Ambulatory Visit: Payer: Self-pay

## 2020-02-03 ENCOUNTER — Ambulatory Visit (INDEPENDENT_AMBULATORY_CARE_PROVIDER_SITE_OTHER): Payer: Self-pay | Admitting: Vascular Surgery

## 2020-02-03 VITALS — BP 162/98 | HR 82 | Temp 98.2°F | Resp 20 | Ht 72.0 in | Wt 173.0 lb

## 2020-02-03 DIAGNOSIS — Z992 Dependence on renal dialysis: Secondary | ICD-10-CM | POA: Insufficient documentation

## 2020-02-03 DIAGNOSIS — N186 End stage renal disease: Secondary | ICD-10-CM | POA: Insufficient documentation

## 2020-02-03 NOTE — Progress Notes (Signed)
Patient ID: Randy Oneal, male   DOB: 29-Dec-1965, 54 y.o.   MRN: [id number]  Reason for Consult: New Patient (Initial Visit)   Referred by Randy Drown, MD  Subjective:     HPI:  Randy Oneal is a 54 y.o. male previous employee of [employer] currently dialyzing via right IJ tunneled dialysis catheter.  Prior to this was never on dialysis.  Does have some distant family members on dialysis.  Does not take blood thinners.  Is right-hand dominant.  Has had left clavicular surgery for an abscess no other chest breast or upper extremity surgery.  Vein mapping arterial duplex is performed prior to today's visit.  Past Medical History:  Diagnosis Date  . Diabetes mellitus    Type 2   . Diabetic retinopathy of both eyes ([place]) 01/31/2019   Dr Coralyn Pear 01/2019  . Herpes simplex virus (HSV) infection    OU  . Hypertension   . Renal insufficiency    ckd stage 4  . Retinal detachment    OS  . Sepsis due to Streptococcus, group B ([place]) 10/05/2018   Family History  Problem Relation Age of Onset  . Hypertension Mother   . Colon cancer Neg Hx    Past Surgical History:  Procedure Laterality Date  . [place] VITRECTOMY WITH 20 GAUGE MVR PORT FOR MACULAR HOLE Right 02/24/2019   Procedure: 25 GAUGE PARS PLANA VITRECTOMY WITH 20 GAUGE MVR PORT FOR MACULAR HOLE;  Surgeon: Bernarda Caffey, MD;  Location: [facility];  Service: Ophthalmology;  Laterality: Right;  . APPLICATION OF WOUND VAC Left 08/27/2018   Procedure: APPLICATION OF WOUND VAC, left neck;  Surgeon: Gaye Pollack, MD;  Location: [facility];  Service: Thoracic;  Laterality: Left;  . APPLICATION OF WOUND VAC Left 08/30/2018   Procedure: APPLICATION OF WOUND VAC;  Surgeon: Gaye Pollack, MD;  Location: [facility];  Service: Thoracic;  Laterality: Left;  . CATARACT EXTRACTION Bilateral   . CATARACT EXTRACTION W/ INTRAOCULAR LENS  IMPLANT, BILATERAL    . COLONOSCOPY  06/24/2011   Procedure: COLONOSCOPY;  Surgeon: [name], MD;   [facility];  Service: Endoscopy;  Laterality: N/A;  8:30 AM  . EYE SURGERY    . GAS/FLUID EXCHANGE Left 03/31/2019   Procedure: Gas/Fluid Exchange;  Surgeon: Bernarda Caffey, MD;  Location: [facility];  Service: Ophthalmology;  Laterality: Left;  . INJECTION OF SILICONE OIL  8/36/6294   Procedure: Injection Of Silicone Oil;  Surgeon: Bernarda Caffey, MD;  Location: [facility];  Service: Ophthalmology;;  . INSERTION OF DIALYSIS CATHETER Right 12/05/2019   Procedure: INSERTION OF DIALYSIS CATHETER RIGHT SUBCLAVIAN;  Surgeon: Virl Cagey, MD;  [facility];  Service: General;  Laterality: Right;  . INSERTION OF DIALYSIS CATHETER Right 12/07/2019   Procedure: Minor Dialysis catheter in place- need to reposition/ adjust catheter to help with flows;  Surgeon: Virl Cagey, MD;  Location[facility];  Service: General;  Laterality: Right;  procedure room case with 1% lidocaine, full sterile drape,  towels, full gown, large chloraprep, 4-0 Monocryl and dermabond   . INSERTION OF DIALYSIS CATHETER Right 12/08/2019   Procedure: INSERTION OF DIALYSIS CATHETER EXCHANGE;  Surgeon: Virl Cagey, MD;  [facility];  Service: General;  Laterality: Right;  . MEMBRANE PEEL Right 02/24/2019   Procedure: Antoine Primas;  Surgeon: Bernarda Caffey, MD;  Location: [facility];  Service: Ophthalmology;  Laterality: Right;  . PARS PLANA VITRECTOMY Left  03/31/2019   Procedure: PARS PLANA VITRECTOMY WITH 25 GAUGE WITH MEMBRANE PEEL;  Surgeon: Bernarda Caffey, MD;  Location: [facility];  Service: Ophthalmology;  Laterality: Left;  . PHOTOCOAGULATION WITH LASER Right 02/24/2019   Procedure: Photocoagulation With Laser;  Surgeon: Bernarda Caffey, MD;  Location: [facility];  Service: Ophthalmology;  Laterality: Right;  . PHOTOCOAGULATION WITH LASER Left 03/31/2019   Procedure: Photocoagulation With Laser;  Surgeon: Bernarda Caffey, MD;  Location: [facility];  Service: Ophthalmology;  Laterality: Left;  . RETINAL DETACHMENT SURGERY Left  03/31/2019   TRD Repair - Dr. Bernarda Caffey  . SILICON OIL REMOVAL Right 03/05/3663   Procedure: Silicon Oil Removal;  Surgeon: Bernarda Caffey, MD;  Location: [facility];  Service: Ophthalmology;  Laterality: Right;  . STERNAL WOUND DEBRIDEMENT Left 08/27/2018   Procedure: Incision and DEBRIDEMENT Left Chest, Neck and Mediastinum;  Surgeon: Gaye Pollack, MD;  Location: [facility];  Service: Thoracic;  Laterality: Left;  . STERNAL WOUND DEBRIDEMENT Left 08/30/2018   Procedure: WOUND VAC CHANGE, LEFT CHEST AND NECK, POSSIBLE DEBRIDEMENT;  Surgeon: Gaye Pollack, MD;  Location: [facility];  Service: Thoracic;  Laterality: Left;    Short Social History:  Social History   Tobacco Use  . Smoking status: Never Smoker  . Smokeless tobacco: Never Used  Substance Use Topics  . Alcohol use: No    Allergies  Allergen Reactions  . Tramadol Nausea And Vomiting    Current Outpatient Medications  Medication Sig Dispense Refill  . acetaminophen (TYLENOL) 500 MG tablet Take 1,000 mg by mouth every 6 (six) hours as needed for moderate pain or headache.    Marland Kitchen amLODipine (NORVASC) 5 MG tablet Take 1 tablet (5 mg total) by mouth in the morning. 30 tablet 5  . atorvastatin (LIPITOR) 10 MG tablet Take 1 tablet (10 mg total) by mouth daily. 30 tablet 5  . brimonidine (ALPHAGAN) 0.2 % ophthalmic solution Place 1 drop into both eyes daily. 10 mL 10  . Bromfenac Sodium (PROLENSA) 0.07 % SOLN Place 1 drop into both eyes 4 (four) times daily. 3 mL 10  . calcitRIOL (ROCALTROL) 0.25 MCG capsule Take 0.25 mcg by mouth 3 (three) times a week.    . Continuous Blood Gluc Sensor (FREESTYLE LIBRE SENSOR SYSTEM) MISC Use one sensor every 10 days. 3 each 2  . Darbepoetin Alfa (ARANESP) 60 MCG/0.3ML SOSY injection Inject 0.3 mLs (60 mcg total) into the vein every Monday with hemodialysis.    . famotidine (PEPCID) 40 MG tablet Take 0.5 tablets (20 mg total) by mouth at bedtime. 30 tablet 1  . hydrALAZINE (APRESOLINE) 100 MG tablet Take 1  tablet (100 mg total) by mouth 3 (three) times daily. 90 tablet 1  . insulin lispro (HUMALOG KWIKPEN) 100 UNIT/ML KwikPen Inject 0.06 mLs (6 Units total) into the skin daily with breakfast AND 0.07 mLs (7 Units total) 2 (two) times daily before lunch and supper. (Patient taking differently: inject 5-9 unit with each meal) 15 mL 11  . Insulin Pen Needle (BD PEN NEEDLE NANO 2ND GEN) 32G X 4 MM MISC Four times daily 150 each 11  . metoprolol tartrate (LOPRESSOR) 25 MG tablet Take 1 tablet (25 mg total) by mouth 2 (two) times daily. 60 tablet 1  . MICROLET LANCETS MISC USE FOUR TIMES A DAY AS DIRECTED. 100 each 0  . nystatin (MYCOSTATIN/NYSTOP) powder Apply topically 2 (two) times daily. To groin and scrotum (Patient taking differently: Apply 1 g topically 2 (two) times daily as needed (  rash). To groin and scrotum) 15 g 0  . ONETOUCH ULTRA test strip USE TO TEST 4 TIMES DAILY. 100 strip 1  . prednisoLONE acetate (PRED FORTE) 1 % ophthalmic suspension Place 1 drop into both eyes 4 (four) times daily. 15 mL 3  . Simethicone (GAS-X PO) Take 1 tablet by mouth daily as needed (gas).    . TRESIBA FLEXTOUCH 100 UNIT/ML SOPN FlexTouch Pen Inject 0.19 mLs (19 Units total) into the skin at bedtime. (Patient taking differently: Inject 22 Units into the skin at bedtime. ) 15 mL 1  . pantoprazole (PROTONIX) 40 MG tablet Take 1 tablet (40 mg total) by mouth daily. 30 tablet 4   No current facility-administered medications for this visit.    Review of Systems  Constitutional:  Constitutional negative. HENT: HENT negative.  Eyes: Positive for visual disturbance.   Respiratory: Respiratory negative.  Cardiovascular: Cardiovascular negative.  GI: Gastrointestinal negative.  Musculoskeletal: Musculoskeletal negative.  Neurological: Positive for dizziness.  Hematologic: Hematologic/lymphatic negative.  Psychiatric: Psychiatric negative.        Objective:  Objective   Vitals:   02/03/20 0936  BP: (!) 162/98   Pulse: 82  Resp: 20  Temp: 98.2 F (36.8 C)  SpO2: 98%  Weight: 173 lb (78.5 kg)  Height: 6' (1.829 m)   Body mass index is 23.46 kg/m.  Physical Exam HENT:     Head: Normocephalic.     Nose:     Comments: Wearing a mask Eyes:     Pupils: Pupils are equal, round, and reactive to light.  Neck:     Comments: Well-healed incision above left clavicle Cardiovascular:     Rate and Rhythm: Normal rate.     Pulses: Normal pulses.  Pulmonary:     Effort: Pulmonary effort is normal.  Abdominal:     General: Abdomen is flat.  Musculoskeletal:     Cervical back: Normal range of motion.  Skin:    General: Skin is warm and dry.     Capillary Refill: Capillary refill takes less than 2 seconds.  Neurological:     General: No focal deficit present.     Mental Status: He is alert.  Psychiatric:        Mood and Affect: Mood normal.        Behavior: Behavior normal.        Thought Content: Thought content normal.        Judgment: Judgment normal.     Data: I have independently interpreted his arterial duplex which demonstrates triphasic waveforms bilaterally right brachial artery 0.4 cm left 0.55 cm.  I have independently interpreted his bilateral upper extremity vein mapping.  Really does not appear to have suitable vein in his upper extremities for dialysis access.     Assessment/Plan:     54 year old male with end-stage renal disease dialyzing via catheter.  We discussed the options for hemodialysis being catheter, fistula, graft.  Patient does not appear to have suitable vein of his nondominant left upper extremity we will certainly look for this at the time of surgery.  We will plan for AV graft versus fistula and I discussed forearm loop graft versus upper arm straight graft.  We discussed the risk and benefits including the risk of primary nonfunction, steal, nerve dysfunction.  He demonstrates good understanding in the presence of his son today and we will get him scheduled  for a nondialysis day in the near future.    Waynetta Sandy MD [facility]

## 2020-02-07 ENCOUNTER — Other Ambulatory Visit: Payer: Self-pay

## 2020-02-14 ENCOUNTER — Encounter (HOSPITAL_COMMUNITY): Payer: Self-pay | Admitting: Vascular Surgery

## 2020-02-14 ENCOUNTER — Other Ambulatory Visit (HOSPITAL_COMMUNITY)
Admission: RE | Admit: 2020-02-14 | Discharge: 2020-02-14 | Disposition: A | Discharge status: Home / Self Care | Payer: Medicare Other | Source: Ambulatory Visit | Attending: Vascular Surgery | Admitting: Vascular Surgery

## 2020-02-14 ENCOUNTER — Other Ambulatory Visit: Payer: Self-pay

## 2020-02-14 DIAGNOSIS — Z01812 Encounter for preprocedural laboratory examination: Secondary | ICD-10-CM | POA: Insufficient documentation

## 2020-02-14 DIAGNOSIS — Z20822 Contact with and (suspected) exposure to covid-19: Secondary | ICD-10-CM | POA: Insufficient documentation

## 2020-02-14 LAB — SARS CORONAVIRUS 2 (TAT 6-24 HRS): SARS Coronavirus 2: NEGATIVE

## 2020-02-14 NOTE — Progress Notes (Addendum)
Mr. Sleight denies chest pain or shortness of breath. Patient was tested for Covid today and it was negative, and has been in quarantine since that time.  Mr. Sibal has type II diabetes. Patient reports that CBG run up to 200.  I instructed patient to take 10 units of Lantus this evening.  If CBG is greater than 220 in am take 1/2 of sliding scale Humolog Insulin. I instructed patient to check CBG after awaking and every 2 hours until arrival  to the hospital.  I Instructed patient if CBG is less than 70 to take 1 tube of Glucose Gel  Recheck CBG in 15 minutes then call pre- op desk at [phone] for further instructions.

## 2020-02-15 ENCOUNTER — Encounter (HOSPITAL_COMMUNITY): Payer: Self-pay | Admitting: Vascular Surgery

## 2020-02-15 ENCOUNTER — Ambulatory Visit (HOSPITAL_COMMUNITY)
Admission: RE | Admit: 2020-02-15 | Discharge: 2020-02-15 | Disposition: A | Discharge status: Home / Self Care | Payer: Medicare Other | Attending: Vascular Surgery | Admitting: Vascular Surgery

## 2020-02-15 ENCOUNTER — Ambulatory Visit: Payer: MEDICAID | Admitting: Podiatry

## 2020-02-15 ENCOUNTER — Ambulatory Visit (HOSPITAL_COMMUNITY): Payer: Medicare Other | Admitting: Anesthesiology

## 2020-02-15 ENCOUNTER — Encounter (HOSPITAL_COMMUNITY)
Admission: RE | Disposition: A | Discharge status: Home / Self Care | Payer: Self-pay | Source: Home / Self Care | Attending: Vascular Surgery

## 2020-02-15 DIAGNOSIS — I120 Hypertensive chronic kidney disease with stage 5 chronic kidney disease or end stage renal disease: Secondary | ICD-10-CM | POA: Insufficient documentation

## 2020-02-15 DIAGNOSIS — Z79899 Other long term (current) drug therapy: Secondary | ICD-10-CM | POA: Diagnosis not present

## 2020-02-15 DIAGNOSIS — Z794 Long term (current) use of insulin: Secondary | ICD-10-CM | POA: Insufficient documentation

## 2020-02-15 DIAGNOSIS — Z885 Allergy status to narcotic agent status: Secondary | ICD-10-CM | POA: Insufficient documentation

## 2020-02-15 DIAGNOSIS — Z8619 Personal history of other infectious and parasitic diseases: Secondary | ICD-10-CM | POA: Insufficient documentation

## 2020-02-15 DIAGNOSIS — E11319 Type 2 diabetes mellitus with unspecified diabetic retinopathy without macular edema: Secondary | ICD-10-CM | POA: Diagnosis not present

## 2020-02-15 DIAGNOSIS — E1122 Type 2 diabetes mellitus with diabetic chronic kidney disease: Secondary | ICD-10-CM | POA: Diagnosis not present

## 2020-02-15 DIAGNOSIS — N185 Chronic kidney disease, stage 5: Secondary | ICD-10-CM

## 2020-02-15 DIAGNOSIS — N186 End stage renal disease: Secondary | ICD-10-CM | POA: Diagnosis not present

## 2020-02-15 DIAGNOSIS — Z8249 Family history of ischemic heart disease and other diseases of the circulatory system: Secondary | ICD-10-CM | POA: Diagnosis not present

## 2020-02-15 DIAGNOSIS — Z992 Dependence on renal dialysis: Secondary | ICD-10-CM | POA: Diagnosis not present

## 2020-02-15 HISTORY — DX: End stage renal disease: N18.6

## 2020-02-15 HISTORY — DX: Anemia, unspecified: D64.9

## 2020-02-15 HISTORY — PX: AV FISTULA PLACEMENT: SHX1204

## 2020-02-15 LAB — GLUCOSE, CAPILLARY
Glucose-Capillary: 145 mg/dL — ABNORMAL HIGH (ref 70–99)
Glucose-Capillary: 112 mg/dL — ABNORMAL HIGH (ref 70–99)
Glucose-Capillary: 107 mg/dL — ABNORMAL HIGH (ref 70–99)

## 2020-02-15 LAB — POCT I-STAT, CHEM 8
BUN: 42 mg/dL — ABNORMAL HIGH (ref 6–20)
Calcium, Ion: 1.22 mmol/L (ref 1.15–1.40)
Chloride: 94 mmol/L — ABNORMAL LOW (ref 98–111)
Creatinine, Ser: 6.9 mg/dL — ABNORMAL HIGH (ref 0.61–1.24)
Glucose, Bld: 141 mg/dL — ABNORMAL HIGH (ref 70–99)
HCT: 37 % — ABNORMAL LOW (ref 39.0–52.0)
Hemoglobin: 12.6 g/dL — ABNORMAL LOW (ref 13.0–17.0)
Potassium: 3.7 mmol/L (ref 3.5–5.1)
Sodium: 138 mmol/L (ref 135–145)
TCO2: 31 mmol/L (ref 22–32)

## 2020-02-15 SURGERY — ARTERIOVENOUS (AV) FISTULA CREATION
Anesthesia: General | Site: Arm Upper | Laterality: Left

## 2020-02-15 MED ORDER — SODIUM CHLORIDE 0.9 % IV SOLN
INTRAVENOUS | Status: AC
  Filled 2020-02-15: qty 1.2

## 2020-02-15 MED ORDER — PROPOFOL 10 MG/ML IV BOLUS
INTRAVENOUS | Status: DC | PRN
  Administered 2020-02-15: 200 mg via INTRAVENOUS

## 2020-02-15 MED ORDER — LIDOCAINE 2% (20 MG/ML) 5 ML SYRINGE
INTRAMUSCULAR | Status: DC | PRN
  Administered 2020-02-15: 60 mg via INTRAVENOUS

## 2020-02-15 MED ORDER — MIDAZOLAM HCL 2 MG/2ML IJ SOLN
INTRAMUSCULAR | Status: AC
  Filled 2020-02-15: qty 2

## 2020-02-15 MED ORDER — PHENYLEPHRINE HCL-NACL 10-0.9 MG/250ML-% IV SOLN
INTRAVENOUS | Status: DC | PRN
  Administered 2020-02-15: 50 ug/min via INTRAVENOUS

## 2020-02-15 MED ORDER — AMLODIPINE BESYLATE 5 MG PO TABS
5.0000 mg | ORAL_TABLET | Freq: Once | ORAL | Status: AC
  Administered 2020-02-15: 5 mg via ORAL
  Filled 2020-02-15: qty 1

## 2020-02-15 MED ORDER — ONDANSETRON HCL 4 MG/2ML IJ SOLN
INTRAMUSCULAR | Status: AC
  Filled 2020-02-15: qty 2

## 2020-02-15 MED ORDER — SODIUM CHLORIDE 0.9 % IV SOLN
INTRAVENOUS | Status: DC | PRN
  Administered 2020-02-15: 500 mL

## 2020-02-15 MED ORDER — SODIUM CHLORIDE 0.9 % IV SOLN: INTRAVENOUS | Status: DC

## 2020-02-15 MED ORDER — CHLORHEXIDINE GLUCONATE 0.12 % MT SOLN
OROMUCOSAL | Status: AC
  Administered 2020-02-15: 15 mL
  Filled 2020-02-15: qty 15

## 2020-02-15 MED ORDER — ONDANSETRON HCL 4 MG/2ML IJ SOLN: 4.0000 mg | Freq: Once | INTRAMUSCULAR | Status: DC | PRN

## 2020-02-15 MED ORDER — ONDANSETRON HCL 4 MG/2ML IJ SOLN
INTRAMUSCULAR | Status: DC | PRN
  Administered 2020-02-15: 4 mg via INTRAVENOUS

## 2020-02-15 MED ORDER — CHLORHEXIDINE GLUCONATE 4 % EX LIQD: 60.0000 mL | Freq: Once | CUTANEOUS | Status: DC

## 2020-02-15 MED ORDER — FENTANYL CITRATE (PF) 250 MCG/5ML IJ SOLN
INTRAMUSCULAR | Status: AC
  Filled 2020-02-15: qty 5

## 2020-02-15 MED ORDER — HYDRALAZINE HCL 20 MG/ML IJ SOLN
5.0000 mg | Freq: Once | INTRAMUSCULAR | Status: AC
  Administered 2020-02-15: 5 mg via INTRAVENOUS
  Filled 2020-02-15: qty 1

## 2020-02-15 MED ORDER — CEFAZOLIN SODIUM-DEXTROSE 2-4 GM/100ML-% IV SOLN
2.0000 g | INTRAVENOUS | Status: AC
  Administered 2020-02-15: 2 g via INTRAVENOUS
  Filled 2020-02-15: qty 100

## 2020-02-15 MED ORDER — 0.9 % SODIUM CHLORIDE (POUR BTL) OPTIME
TOPICAL | Status: DC | PRN
  Administered 2020-02-15: 1000 mL

## 2020-02-15 MED ORDER — FENTANYL CITRATE (PF) 100 MCG/2ML IJ SOLN
INTRAMUSCULAR | Status: DC | PRN
  Administered 2020-02-15 (×2): 50 ug via INTRAVENOUS

## 2020-02-15 MED ORDER — HYDRALAZINE HCL 20 MG/ML IJ SOLN
5.0000 mg | Freq: Once | INTRAMUSCULAR | Status: AC
  Administered 2020-02-15: 5 mg via INTRAVENOUS

## 2020-02-15 MED ORDER — LIDOCAINE 2% (20 MG/ML) 5 ML SYRINGE
INTRAMUSCULAR | Status: AC
  Filled 2020-02-15: qty 5

## 2020-02-15 MED ORDER — DEXAMETHASONE SODIUM PHOSPHATE 10 MG/ML IJ SOLN
INTRAMUSCULAR | Status: AC
  Filled 2020-02-15: qty 1

## 2020-02-15 MED ORDER — DEXAMETHASONE SODIUM PHOSPHATE 4 MG/ML IJ SOLN
INTRAMUSCULAR | Status: DC | PRN
  Administered 2020-02-15: 5 mg via INTRAVENOUS

## 2020-02-15 MED ORDER — FENTANYL CITRATE (PF) 100 MCG/2ML IJ SOLN: 25.0000 ug | INTRAMUSCULAR | Status: DC | PRN

## 2020-02-15 MED ORDER — OXYCODONE-ACETAMINOPHEN 7.5-325 MG PO TABS: 1.0000 | ORAL_TABLET | ORAL | 0 refills | Status: AC | PRN

## 2020-02-15 MED ORDER — ACETAMINOPHEN 500 MG PO TABS
500.0000 mg | ORAL_TABLET | Freq: Once | ORAL | Status: AC
  Administered 2020-02-15: 500 mg via ORAL
  Filled 2020-02-15: qty 1

## 2020-02-15 MED ORDER — MIDAZOLAM HCL 5 MG/5ML IJ SOLN
INTRAMUSCULAR | Status: DC | PRN
  Administered 2020-02-15: 2 mg via INTRAVENOUS

## 2020-02-15 SURGICAL SUPPLY — 31 items
ADH SKN CLS APL DERMABOND .7 (GAUZE/BANDAGES/DRESSINGS) ×1
ARMBAND PINK RESTRICT EXTREMIT (MISCELLANEOUS) ×3 IMPLANT
CANISTER SUCT 3000ML PPV (MISCELLANEOUS) ×3 IMPLANT
CLIP VESOCCLUDE MED 6/CT (CLIP) ×3 IMPLANT
CLIP VESOCCLUDE SM WIDE 6/CT (CLIP) ×3 IMPLANT
COVER PROBE W GEL 5X96 (DRAPES) IMPLANT
COVER WAND RF STERILE (DRAPES) ×3 IMPLANT
DERMABOND ADVANCED (GAUZE/BANDAGES/DRESSINGS) ×2
DERMABOND ADVANCED .7 DNX12 (GAUZE/BANDAGES/DRESSINGS) ×1 IMPLANT
ELECT REM PT RETURN 9FT ADLT (ELECTROSURGICAL) ×3
ELECTRODE REM PT RTRN 9FT ADLT (ELECTROSURGICAL) ×1 IMPLANT
GLOVE BIO SURGEON STRL SZ7.5 (GLOVE) ×3 IMPLANT
GLOVE BIOGEL PI IND STRL 7.0 (GLOVE) IMPLANT
GLOVE BIOGEL PI INDICATOR 7.0 (GLOVE) ×2
GOWN STRL REUS W/ TWL LRG LVL3 (GOWN DISPOSABLE) ×2 IMPLANT
GOWN STRL REUS W/ TWL XL LVL3 (GOWN DISPOSABLE) ×1 IMPLANT
GOWN STRL REUS W/TWL LRG LVL3 (GOWN DISPOSABLE) ×6
GOWN STRL REUS W/TWL XL LVL3 (GOWN DISPOSABLE) ×3
INSERT FOGARTY SM (MISCELLANEOUS) IMPLANT
KIT BASIN OR (CUSTOM PROCEDURE TRAY) ×3 IMPLANT
KIT TURNOVER KIT B (KITS) ×3 IMPLANT
NS IRRIG 1000ML POUR BTL (IV SOLUTION) ×3 IMPLANT
PACK CV ACCESS (CUSTOM PROCEDURE TRAY) ×3 IMPLANT
PAD ARMBOARD 7.5X6 YLW CONV (MISCELLANEOUS) ×6 IMPLANT
SUT MNCRL AB 4-0 PS2 18 (SUTURE) ×3 IMPLANT
SUT PROLENE 6 0 BV (SUTURE) ×5 IMPLANT
SUT VIC AB 3-0 SH 27 (SUTURE) ×3
SUT VIC AB 3-0 SH 27X BRD (SUTURE) ×1 IMPLANT
TOWEL GREEN STERILE (TOWEL DISPOSABLE) ×3 IMPLANT
UNDERPAD 30X36 HEAVY ABSORB (UNDERPADS AND DIAPERS) ×3 IMPLANT
WATER STERILE IRR 1000ML POUR (IV SOLUTION) ×3 IMPLANT

## 2020-02-15 NOTE — Anesthesia Preprocedure Evaluation (Addendum)
Anesthesia Evaluation  Patient identified by MRN, date of birth, ID band Patient awake    Reviewed: Allergy & Precautions, NPO status , Patient's Chart, lab work & pertinent test results  Airway Mallampati: II  TM Distance: >3 FB Neck ROM: Full    Dental no notable dental hx.    Pulmonary neg pulmonary ROS,    Pulmonary exam normal breath sounds clear to auscultation       Cardiovascular hypertension, Pt. on medications and Pt. on home beta blockers Normal cardiovascular exam Rhythm:Regular Rate:Normal  ECG: SR, rate 86   Neuro/Psych negative neurological ROS  negative psych ROS   GI/Hepatic negative GI ROS, Neg liver ROS,   Endo/Other  diabetes, Insulin Dependent  Renal/GU ESRF and DialysisRenal diseaseHD on T, R, Sat     Musculoskeletal negative musculoskeletal ROS (+)   Abdominal   Peds  Hematology  (+) anemia , HLD   Anesthesia Other Findings END STAGE RENAL DISEASE  Reproductive/Obstetrics                            Anesthesia Physical Anesthesia Plan  ASA: III  Anesthesia Plan: General   Post-op Pain Management:    Induction: Intravenous  PONV Risk Score and Plan: 2 and Ondansetron, Dexamethasone and Treatment may vary due to age or medical condition  Airway Management Planned: LMA  Additional Equipment:   Intra-op Plan:   Post-operative Plan: Extubation in OR  Informed Consent: I have reviewed the patients History and Physical, chart, labs and discussed the procedure including the risks, benefits and alternatives for the proposed anesthesia with the patient or authorized representative who has indicated his/her understanding and acceptance.     Dental advisory given  Plan Discussed with: CRNA  Anesthesia Plan Comments:        Anesthesia Quick Evaluation

## 2020-02-15 NOTE — Op Note (Signed)
    Patient name: Bernd Crom MRN: [id number] DOB: 1966-05-10 Sex: male  02/15/2020 Pre-operative Diagnosis: esrd Post-operative diagnosis:  Same Surgeon:  Erlene Quan C. Donzetta Matters, MD Assistant: Elenor Legato, RNFA Procedure Performed: Left upper extremity brachial artery to cephalic vein AV fistula creation  Indications: 54 year old male on dialysis via catheter.  He is right-hand dominant is now indicated for permanent left upper extremity access.  Findings: Cephalic vein in the upper arm was actually quite large the antecubital and throughout the upper extremity was approximately 3 mm by ultrasound.  I completion was a very strong thrill in the vein and a palpable radial artery pulse at the wrist.   Procedure:  The patient was identified in the holding area and taken to the operating room is placed on the operative table and general anesthesia was induced.  He was sterilely prepped and draped in the left upper extremity usual fashion oxygen saturation was called.  Patient was used to identify what appeared to be a suitable cephalic vein.  Transverse incision was made below the antecubitum.  We dissected with amputation.  We dissected through the deep fascia identified brachial artery pulse distal to this.  Veins transected distally and tied off.  This was spatulated flushed with heparinized saline and clamped.  The artery distally significant extending directly.  We recently incised to completion of functional brachial.  Conclusions there was noted to be a very strong thrill in the fistula and a palpable radial artery pulse at the wrist.  These were confirmed with Doppler continue was irrigated hemostasis was obtained.  Wound was then closed in layers.  Dermabond placed skin.  He was awakened from anesthesia.  All counts were correct completion.  EBL: 20cc  Lam Bjorklund C. Donzetta Matters, MD [facility] Office: [phone] Pager: [phone]

## 2020-02-15 NOTE — Anesthesia Procedure Notes (Signed)
Procedure Name: LMA Insertion Date/Time: 02/15/2020 11:24 AM Performed by: Trinna Post., CRNA Pre-anesthesia Checklist: Patient identified, Emergency Drugs available, Suction available, Patient being monitored and Timeout performed Patient Re-evaluated:Patient Re-evaluated prior to induction Oxygen Delivery Method: Circle system utilized Preoxygenation: Pre-oxygenation with 100% oxygen Induction Type: IV induction Ventilation: Mask ventilation without difficulty LMA: LMA inserted LMA Size: 4.0 Number of attempts: 1 Placement Confirmation: positive ETCO2 and breath sounds checked- equal and bilateral Tube secured with: Tape Dental Injury: Teeth and Oropharynx as per pre-operative assessment

## 2020-02-15 NOTE — Anesthesia Postprocedure Evaluation (Signed)
Anesthesia Post Note  Patient: [name] III  Procedure(s) Performed: LEFT ARM ARTERIOVENOUS (AV) FISTULA CREATION (Left Arm Upper)     Patient location during evaluation: PACU Anesthesia Type: General Level of consciousness: awake and alert Pain management: pain level controlled Vital Signs Assessment: post-procedure vital signs reviewed and stable Respiratory status: spontaneous breathing, nonlabored ventilation, respiratory function stable and patient connected to nasal cannula oxygen Cardiovascular status: blood pressure returned to baseline and stable Postop Assessment: no apparent nausea or vomiting Anesthetic complications: no   No complications documented.  Last Vitals:  Vitals:   02/15/20 1245 02/15/20 1251  BP: 128/75 132/77  Pulse: 70 71  Resp: (!) 24 18  Temp:    SpO2: 100% 100%    Last Pain:  Vitals:   02/15/20 1251  TempSrc:   PainSc: 0-No pain                 Shakiera Edelson P Maley Venezia

## 2020-02-15 NOTE — H&P (Signed)
   History and Physical Update  The patient was interviewed and re-examined.  The patient's previous History and Physical has been reviewed and is unchanged from recent office visit. Plan for left arm avf vs more likely graft.   Rice Walsh C. Donzetta Matters, MD [facility] Office: [phone] Pager: [phone]   02/15/2020, 8:21 AM

## 2020-02-15 NOTE — Transfer of Care (Signed)
Immediate Anesthesia Transfer of Care Note  Patient: Randy Oneal  Procedure(s) Performed: LEFT ARM ARTERIOVENOUS (AV) FISTULA CREATION (Left Arm Upper)  Patient Location: PACU  Anesthesia Type:General  Level of Consciousness: drowsy  Airway & Oxygen Therapy: Patient Spontanous Breathing and Patient connected to nasal cannula oxygen  Post-op Assessment: Report given to RN and Post -op Vital signs reviewed and stable  Post vital signs: Reviewed and stable  Last Vitals:  Vitals Value Taken Time  BP 134/81 02/15/20 1230  Temp    Pulse 88 02/15/20 1232  Resp 14 02/15/20 1232  SpO2 100 % 02/15/20 1232  Vitals shown include unvalidated device data.  Last Pain:  Vitals:   02/15/20 1230  TempSrc:   PainSc: (P) 0-No pain         Complications: No complications documented.

## 2020-02-16 ENCOUNTER — Encounter (HOSPITAL_COMMUNITY): Payer: Self-pay | Admitting: Vascular Surgery

## 2020-02-22 ENCOUNTER — Ambulatory Visit (INDEPENDENT_AMBULATORY_CARE_PROVIDER_SITE_OTHER): Payer: Self-pay | Admitting: Family Medicine

## 2020-02-22 ENCOUNTER — Other Ambulatory Visit: Payer: Self-pay

## 2020-02-22 VITALS — BP 144/94 | Temp 97.7°F | Ht 72.0 in | Wt 177.2 lb

## 2020-02-22 DIAGNOSIS — Z1159 Encounter for screening for other viral diseases: Secondary | ICD-10-CM

## 2020-02-22 DIAGNOSIS — E119 Type 2 diabetes mellitus without complications: Secondary | ICD-10-CM

## 2020-02-22 DIAGNOSIS — I10 Essential (primary) hypertension: Secondary | ICD-10-CM

## 2020-02-22 DIAGNOSIS — Z125 Encounter for screening for malignant neoplasm of prostate: Secondary | ICD-10-CM

## 2020-02-22 MED ORDER — AMLODIPINE BESYLATE 2.5 MG PO TABS
2.5000 mg | ORAL_TABLET | Freq: Every day | ORAL | 3 refills | Status: DC
Start: 2020-02-22 — End: 2020-10-18

## 2020-02-22 NOTE — Progress Notes (Signed)
   Subjective:    Patient ID: Randy Oneal, male    DOB: Dec 24, 1965, 54 y.o.   MRN: [id number]  HPI   Patient arrives for a follow up on blood pressure. Patient for follow-up of blood pressure Stop taking the Norvasc Blood pressure occasionally running high Denies any headaches chest tightness Gets his dialysis on a regular basis  Patient's blood pressure was low at last visit and stopped Norvasc.      Review of Systems  Constitutional: Negative for activity change, fatigue and fever.  HENT: Negative for congestion and rhinorrhea.   Respiratory: Negative for cough and shortness of breath.   Cardiovascular: Negative for chest pain and leg swelling.  Gastrointestinal: Negative for abdominal pain, diarrhea and nausea.  Genitourinary: Negative for dysuria and hematuria.  Neurological: Negative for weakness and headaches.  Psychiatric/Behavioral: Negative for agitation and behavioral problems.       Objective:   Physical Exam Constitutional:      General: He is not in acute distress.    Appearance: He is well-developed.  HENT:     Head: Normocephalic.  Cardiovascular:     Rate and Rhythm: Normal rate and regular rhythm.     Heart sounds: Normal heart sounds. No murmur heard.   Pulmonary:     Effort: Pulmonary effort is normal.     Breath sounds: Normal breath sounds.  Skin:    General: Skin is warm and dry.  Neurological:     Mental Status: He is alert.  Psychiatric:        Behavior: Behavior normal.    Check blood pressure multiple times Best reading 144/94  Patient is due for PSA Also patient due for hep C antibody screening Patient encouraged to follow-up with endocrinology for his diabetes     Assessment & Plan:  Given his symptomatology I would recommend low-dose amlodipine 2.5 mg daily Follow-up 4 weeks for recheck Side effects discussed If any ongoing trouble notify us

## 2020-02-24 ENCOUNTER — Ambulatory Visit (INDEPENDENT_AMBULATORY_CARE_PROVIDER_SITE_OTHER): Payer: Self-pay | Admitting: Podiatry

## 2020-02-24 ENCOUNTER — Other Ambulatory Visit: Payer: Self-pay

## 2020-02-24 DIAGNOSIS — M79675 Pain in left toe(s): Secondary | ICD-10-CM

## 2020-02-24 DIAGNOSIS — L84 Corns and callosities: Secondary | ICD-10-CM

## 2020-02-24 DIAGNOSIS — E1142 Type 2 diabetes mellitus with diabetic polyneuropathy: Secondary | ICD-10-CM

## 2020-02-24 DIAGNOSIS — B351 Tinea unguium: Secondary | ICD-10-CM

## 2020-02-24 DIAGNOSIS — M79674 Pain in right toe(s): Secondary | ICD-10-CM

## 2020-02-28 ENCOUNTER — Encounter: Payer: Self-pay | Admitting: Podiatry

## 2020-02-28 NOTE — Progress Notes (Signed)
Subjective:  Patient ID: Randy Oneal, male    DOB: July 22, 1966,  MRN: [id number]  Chief Complaint  Patient presents with   Diabetic Ulcer    2 week follow up    54 y.o. male presents with the above complaint.  Patient presents for follow-up of right hallux hyperkeratotic lesion/preulcerative lesion.  Patient states is doing well.  He is a type II diabetic with unknown A1c.  He says he was doing Betadine wet-to-dry dressing changes.  Is been dry stable.  He denies any other acute complaints.   Review of Systems: Negative except as noted in the HPI. Denies N/V/F/Ch.  Past Medical History:  Diagnosis Date   Anemia    Diabetes mellitus    Type 2    Diabetic retinopathy of both eyes ([place]) 01/31/2019   Dr Coralyn Pear 01/2019   ESRD (end stage renal disease) ([place])    [place] Frenisus   Herpes simplex virus (HSV) infection    OU   Hypertension    Retinal detachment    OS   Sepsis due to Streptococcus, group B ([place]) 10/05/2018    Current Outpatient Medications:    Methoxy PEG-Epoetin Beta (MIRCERA IJ), Mircera, Disp: , Rfl:    acetaminophen (TYLENOL) 500 MG tablet, Take 1,000 mg by mouth every 6 (six) hours as needed for moderate pain or headache., Disp: , Rfl:    amLODipine (NORVASC) 2.5 MG tablet, Take 1 tablet (2.5 mg total) by mouth daily., Disp: 30 tablet, Rfl: 3   atorvastatin (LIPITOR) 10 MG tablet, Take 1 tablet (10 mg total) by mouth daily., Disp: 30 tablet, Rfl: 5   brimonidine (ALPHAGAN) 0.2 % ophthalmic solution, Place 1 drop into both eyes daily., Disp: 10 mL, Rfl: 10   Bromfenac Sodium (PROLENSA) 0.07 % SOLN, Place 1 drop into both eyes 4 (four) times daily. (Patient not taking: Reported on 02/10/2020), Disp: 3 mL, Rfl: 10   calcium acetate (PHOSLO) 667 MG capsule, Take 1,334 mg by mouth 3 (three) times daily with meals., Disp: , Rfl:    Calcium Acetate 668 (169 Ca) MG TABS, TAKE 2 TABLETS BY MOUTH THREE TIMES DAILY WITH MEALS, Disp: , Rfl:    Continuous  Blood Gluc Sensor (FREESTYLE LIBRE SENSOR SYSTEM) MISC, Use one sensor every 10 days., Disp: 3 each, Rfl: 2   Darbepoetin Alfa (ARANESP) 60 MCG/0.3ML SOSY injection, Inject 0.3 mLs (60 mcg total) into the vein every Monday with hemodialysis., Disp: , Rfl:    famotidine (PEPCID) 40 MG tablet, Take 0.5 tablets (20 mg total) by mouth at bedtime., Disp: 30 tablet, Rfl: 1   insulin lispro (HUMALOG KWIKPEN) 100 UNIT/ML KwikPen, Inject 0.06 mLs (6 Units total) into the skin daily with breakfast AND 0.07 mLs (7 Units total) 2 (two) times daily before lunch and supper. (Patient taking differently: inject 5-9 unit with each meal), Disp: 15 mL, Rfl: 11   Insulin Pen Needle (BD PEN NEEDLE NANO 2ND GEN) 32G X 4 MM MISC, Four times daily, Disp: 150 each, Rfl: 11   metoprolol tartrate (LOPRESSOR) 25 MG tablet, Take 1 tablet (25 mg total) by mouth 2 (two) times daily., Disp: 60 tablet, Rfl: 1   MICROLET LANCETS MISC, USE FOUR TIMES A DAY AS DIRECTED., Disp: 100 each, Rfl: 0   nystatin (MYCOSTATIN/NYSTOP) powder, Apply topically 2 (two) times daily. To groin and scrotum (Patient taking differently: Apply 1 g topically 2 (two) times daily as needed (rash). To groin and scrotum), Disp: 15 g, Rfl: 0   ONETOUCH ULTRA  test strip, USE TO TEST 4 TIMES DAILY., Disp: 100 strip, Rfl: 1   prednisoLONE acetate (PRED FORTE) 1 % ophthalmic suspension, Place 1 drop into both eyes 4 (four) times daily., Disp: 15 mL, Rfl: 3   sucroferric oxyhydroxide (VELPHORO) 500 MG chewable tablet, Chew 500 mg by mouth 3 (three) times daily with meals., Disp: , Rfl:    TRESIBA FLEXTOUCH 100 UNIT/ML SOPN FlexTouch Pen, Inject 0.19 mLs (19 Units total) into the skin at bedtime. (Patient taking differently: Inject 22 Units into the skin at bedtime. ), Disp: 15 mL, Rfl: 1  Social History   Tobacco Use  Smoking Status Never Smoker  Smokeless Tobacco Never Used    Allergies  Allergen Reactions   Tramadol Nausea And Vomiting    Objective:  There were no vitals filed for this visit. There is no height or weight on file to calculate BMI. Constitutional Well developed. Well nourished.  Vascular Dorsalis pedis pulses palpable bilaterally. Posterior tibial pulses palpable bilaterally. Capillary refill normal to all digits.  No cyanosis or clubbing noted. Pedal hair growth normal.  Neurologic Normal speech. Oriented to person, place, and time. Epicritic sensation to light touch grossly present bilaterally.  Dermatologic  right hallux IPJ hyperkeratotic lesion without any pinpoint bleeding.  Pressure/blood blister noted when debriding.  No pinpoint bleeding noted.  Breakdown of the epidermal layer noted.  No exposure wound noted.  No clinical signs of infection noted.  No ulcers noted.  No clinical signs of infection noted.  Nail Exam: Pt has thick disfigured discolored nails with subungual debris noted bilateral entire nail hallux through fifth toenails.  Pain on palpation to the nails.  Orthopedic: Normal joint ROM without pain or crepitus bilaterally. No visible deformities. No bony tenderness.   Radiographs: None Assessment:   1. Diabetic polyneuropathy associated with type 2 diabetes mellitus ([place])   2. Pre-ulcerative calluses   3. Pain due to onychomycosis of toenails of both feet    Plan:  Patient was evaluated and treated and all questions answered.  Right hallux IPJ preulcerative callus/lesion -Clinically improved.  The preulcerative lesion is stable without underlying concern for ulceration at this time. -Given the contracture as well as preulcerative lesion I believe patient will benefit from diabetic shoes with underlying insoles.  Patient understands that insurances will not cover them.  However I believe patient will benefit from orthotics management with offloading of the lesion to the right hallux IPJ.  Patient was given a prescription to obtain orthotics from [facility] with offloading of the right  hallux IPJ  Patient was evaluated and treated and all questions answered.  Onychomycosis with pain  -Nails palliatively debrided as below. -Educated on self-care  Procedure: Nail Debridement Rationale: pain  Type of Debridement: manual, sharp debridement. Instrumentation: Nail nipper, rotary burr. Number of Nails: 10  Procedures and Treatment: Consent by patient was obtained for treatment procedures. The patient understood the discussion of treatment and procedures well. All questions were answered thoroughly reviewed. Debridement of mycotic and hypertrophic toenails, 1 through 5 bilateral and clearing of subungual debris. No ulceration, no infection noted.  Return Visit-Office Procedure: Patient instructed to return to the office for a follow up visit 3 months for continued evaluation and treatment.  Boneta Lucks, DPM    No follow-ups on file.   No follow-ups on file.

## 2020-02-29 DIAGNOSIS — T7840XA Allergy, unspecified, initial encounter: Secondary | ICD-10-CM | POA: Insufficient documentation

## 2020-02-29 DIAGNOSIS — T782XXA Anaphylactic shock, unspecified, initial encounter: Secondary | ICD-10-CM | POA: Insufficient documentation

## 2020-03-05 ENCOUNTER — Other Ambulatory Visit (INDEPENDENT_AMBULATORY_CARE_PROVIDER_SITE_OTHER): Payer: Self-pay | Admitting: Ophthalmology

## 2020-03-06 DIAGNOSIS — Z029 Encounter for administrative examinations, unspecified: Secondary | ICD-10-CM

## 2020-03-16 ENCOUNTER — Other Ambulatory Visit: Payer: Self-pay | Admitting: *Deleted

## 2020-03-16 DIAGNOSIS — N186 End stage renal disease: Secondary | ICD-10-CM

## 2020-03-21 ENCOUNTER — Encounter: Payer: Self-pay | Admitting: Family Medicine

## 2020-03-21 ENCOUNTER — Ambulatory Visit (INDEPENDENT_AMBULATORY_CARE_PROVIDER_SITE_OTHER): Payer: No Typology Code available for payment source | Admitting: Family Medicine

## 2020-03-21 ENCOUNTER — Other Ambulatory Visit: Payer: Self-pay

## 2020-03-21 VITALS — BP 128/76 | HR 81 | Temp 98.1°F | Ht 72.0 in | Wt 177.0 lb

## 2020-03-21 DIAGNOSIS — I10 Essential (primary) hypertension: Secondary | ICD-10-CM

## 2020-03-21 DIAGNOSIS — E119 Type 2 diabetes mellitus without complications: Secondary | ICD-10-CM

## 2020-03-21 NOTE — Progress Notes (Signed)
   Subjective:    Patient ID: Randy Oneal, male    DOB: February 20, 1966, 54 y.o.   MRN: [id number]  HPI4 week follow up on blood pressure.  Gentleman here for follow-up blood pressure he also has diabetes states his sugars been under decent control in addition to all of this he denies any chest pain shortness of breath denies any low sugar spells.  He does follow with diabetologist on a regular basis.  Also gets his regular dialysis.   Review of Systems  Constitutional: Negative for activity change, fatigue and fever.  HENT: Negative for congestion and rhinorrhea.   Respiratory: Negative for cough and shortness of breath.   Cardiovascular: Negative for chest pain and leg swelling.  Gastrointestinal: Negative for abdominal pain, diarrhea and nausea.  Genitourinary: Negative for dysuria and hematuria.  Neurological: Negative for weakness and headaches.  Psychiatric/Behavioral: Negative for agitation and behavioral problems.       Objective:   Physical Exam Vitals reviewed.  Cardiovascular:     Rate and Rhythm: Normal rate and regular rhythm.     Heart sounds: Normal heart sounds. No murmur heard.   Pulmonary:     Effort: Pulmonary effort is normal.     Breath sounds: Normal breath sounds.  Lymphadenopathy:     Cervical: No cervical adenopathy.  Neurological:     Mental Status: He is alert.  Psychiatric:        Behavior: Behavior normal.     Blood pressure checked several times decent readings      Assessment & Plan:  HTN decent control continue current measures Renal failure continue with dialysis Covid protection he has had his vaccine recommended caution when around crowds wear a mask on a regular basis Diabetes fair control followed through with endocrinology on a regular basis Patient to recheck here in 4 months

## 2020-03-22 ENCOUNTER — Ambulatory Visit: Payer: Self-pay | Admitting: Family Medicine

## 2020-03-26 ENCOUNTER — Ambulatory Visit (INDEPENDENT_AMBULATORY_CARE_PROVIDER_SITE_OTHER): Payer: Medicare Other | Admitting: Podiatry

## 2020-03-26 ENCOUNTER — Other Ambulatory Visit: Payer: Self-pay

## 2020-03-26 DIAGNOSIS — E1142 Type 2 diabetes mellitus with diabetic polyneuropathy: Secondary | ICD-10-CM | POA: Diagnosis not present

## 2020-03-26 DIAGNOSIS — L97511 Non-pressure chronic ulcer of other part of right foot limited to breakdown of skin: Secondary | ICD-10-CM

## 2020-03-27 ENCOUNTER — Encounter: Payer: Self-pay | Admitting: Podiatry

## 2020-03-27 NOTE — Progress Notes (Signed)
Subjective:  Patient ID: Randy Oneal, male    DOB: 20-Aug-1965,  MRN: [id number]  Chief Complaint  Patient presents with  . Foot Ulcer    pt is here for a 4 week ulcer f/u    54 y.o. male presents for wound care.  Patient presents with right hallux IPJ ulceration with limited to the breakdown of the skin.  Patient had preulcerative lesion prior to today however it appears that underlying wound has opened up.  Patient has been ambulating with surgical shoe.  He states that he does not have any pain there is no signs of infection he denies any other acute complaints.   Review of Systems: Negative except as noted in the HPI. Denies N/V/F/Ch.  Past Medical History:  Diagnosis Date  . Anemia   . Diabetes mellitus    Type 2   . Diabetic retinopathy of both eyes ([place]) 01/31/2019   Dr Coralyn Pear 01/2019  . ESRD (end stage renal disease) ([place])    [place] Frenisus  . Herpes simplex virus (HSV) infection    OU  . Hypertension   . Retinal detachment    OS  . Sepsis due to Streptococcus, group B ([place]) 10/05/2018    Current Outpatient Medications:  .  acetaminophen (TYLENOL) 500 MG tablet, Take 1,000 mg by mouth every 6 (six) hours as needed for moderate pain or headache., Disp: , Rfl:  .  amLODipine (NORVASC) 2.5 MG tablet, Take 1 tablet (2.5 mg total) by mouth daily., Disp: 30 tablet, Rfl: 3 .  atorvastatin (LIPITOR) 10 MG tablet, Take 1 tablet (10 mg total) by mouth daily., Disp: 30 tablet, Rfl: 5 .  brimonidine (ALPHAGAN) 0.2 % ophthalmic solution, Place 1 drop into both eyes daily., Disp: 10 mL, Rfl: 10 .  Bromfenac Sodium (PROLENSA) 0.07 % SOLN, Place 1 drop into both eyes 4 (four) times daily., Disp: 3 mL, Rfl: 10 .  calcium acetate (PHOSLO) 667 MG capsule, Take 1,334 mg by mouth 3 (three) times daily with meals., Disp: , Rfl:  .  Calcium Acetate 668 (169 Ca) MG TABS, TAKE 2 TABLETS BY MOUTH THREE TIMES DAILY WITH MEALS, Disp: , Rfl:  .  Continuous Blood Gluc Sensor ([place]) MISC, Use one sensor every 10 days., Disp: 3 each, Rfl: 2 .  Darbepoetin Alfa (ARANESP) 60 MCG/0.3ML SOSY injection, Inject 0.3 mLs (60 mcg total) into the vein every Monday with hemodialysis., Disp: , Rfl:  .  famotidine (PEPCID) 40 MG tablet, Take 0.5 tablets (20 mg total) by mouth at bedtime., Disp: 30 tablet, Rfl: 1 .  insulin lispro (HUMALOG KWIKPEN) 100 UNIT/ML KwikPen, Inject 0.06 mLs (6 Units total) into the skin daily with breakfast AND 0.07 mLs (7 Units total) 2 (two) times daily before lunch and supper. (Patient taking differently: inject 5-9 unit with each meal), Disp: 15 mL, Rfl: 11 .  Insulin Pen Needle (BD PEN NEEDLE NANO 2ND GEN) 32G X 4 MM MISC, Four times daily, Disp: 150 each, Rfl: 11 .  Methoxy PEG-Epoetin Beta (MIRCERA IJ), Mircera, Disp: , Rfl:  .  metoprolol tartrate (LOPRESSOR) 25 MG tablet, Take 1 tablet (25 mg total) by mouth 2 (two) times daily., Disp: 60 tablet, Rfl: 1 .  MICROLET LANCETS MISC, USE FOUR TIMES A DAY AS DIRECTED., Disp: 100 each, Rfl: 0 .  nystatin (MYCOSTATIN/NYSTOP) powder, Apply topically 2 (two) times daily. To groin and scrotum (Patient taking differently: Apply 1 g topically 2 (two) times daily as needed (rash). To  groin and scrotum), Disp: 15 g, Rfl: 0 .  ONETOUCH ULTRA test strip, USE TO TEST 4 TIMES DAILY., Disp: 100 strip, Rfl: 1 .  prednisoLONE acetate (PRED FORTE) 1 % ophthalmic suspension, Place 1 drop into both eyes 4 (four) times daily., Disp: 15 mL, Rfl: 3 .  sucroferric oxyhydroxide (VELPHORO) 500 MG chewable tablet, Chew 500 mg by mouth 3 (three) times daily with meals., Disp: , Rfl:  .  TRESIBA FLEXTOUCH 100 UNIT/ML SOPN FlexTouch Pen, Inject 0.19 mLs (19 Units total) into the skin at bedtime. (Patient taking differently: Inject 22 Units into the skin at bedtime. ), Disp: 15 mL, Rfl: 1  Social History   Tobacco Use  Smoking Status Never Smoker  Smokeless Tobacco Never Used    Allergies  Allergen Reactions  . Tramadol  Nausea And Vomiting   Objective:  There were no vitals filed for this visit. There is no height or weight on file to calculate BMI. Constitutional Well developed. Well nourished.  Vascular Dorsalis pedis pulses palpable bilaterally. Posterior tibial pulses palpable bilaterally. Capillary refill normal to all digits.  No cyanosis or clubbing noted. Pedal hair growth normal.  Neurologic Normal speech. Oriented to person, place, and time. Protective sensation absent  Dermatologic Wound Location: Right hallux IPJ limited to the breakdown of the skin.  Does not probe to bone.  No clinical signs of infection including erythema/cellulitis noted Wound Base: Mixed Granular/Fibrotic Peri-wound: Macerated Exudate: Scant/small amount Serous exudate Wound Measurements: -See below  Orthopedic: No pain to palpation either foot.   Radiographs: None Assessment:   1. Diabetic polyneuropathy associated with type 2 diabetes mellitus ([place])   2. Toe ulcer, right, limited to breakdown of skin [facility])    Plan:  Patient was evaluated and treated and all questions answered.  Ulcer right hallux limited to the breakdown of the skin/partial-thickness -Debridement as below. -Dressed with Betadine wet-to-dry, DSD. -Continue off-loading with surgical shoe. -Patient was given a prescription to obtain orthotics from [facility] as to allow for offloading of the hallux IPJ as well as address the underlying pes planus deformity.  However patient still waiting on Medicaid approval prior to going to [facility] to get the orthotics.  Procedure: Excisional Debridement of Wound Tool: Sharp chisel blade/tissue nipper Rationale: Removal of non-viable soft tissue from the wound to promote healing.  Anesthesia: none Pre-Debridement Wound Measurements: 0.8 cm x 0.3 cm x 0.1 cm  Post-Debridement Wound Measurements: 0.9 cm x 0.3 cm x 0.2 cm  Type of Debridement: Sharp Excisional Tissue Removed: Non-viable soft tissue Blood loss:  Minimal (<50cc) Depth of Debridement: subcutaneous tissue. Technique: Sharp excisional debridement to bleeding, viable wound base.  Wound Progress: This is my initial evaluation for wound.  I will continue to monitor the progression of it. Site healing conversation 7 Dressing: Dry, sterile, compression dressing. Disposition: Patient tolerated procedure well. Patient to return in 1 week for follow-up.  No follow-ups on file.

## 2020-03-30 ENCOUNTER — Other Ambulatory Visit: Payer: Self-pay

## 2020-03-30 ENCOUNTER — Ambulatory Visit (HOSPITAL_COMMUNITY)
Admission: RE | Admit: 2020-03-30 | Discharge: 2020-03-30 | Disposition: A | Discharge status: Home / Self Care | Payer: Medicare Other | Source: Ambulatory Visit | Attending: Vascular Surgery | Admitting: Vascular Surgery

## 2020-03-30 ENCOUNTER — Ambulatory Visit (INDEPENDENT_AMBULATORY_CARE_PROVIDER_SITE_OTHER): Payer: Self-pay | Admitting: Physician Assistant

## 2020-03-30 VITALS — BP 167/85 | HR 72 | Temp 97.3°F | Resp 16 | Ht 71.0 in | Wt 176.0 lb

## 2020-03-30 DIAGNOSIS — Z992 Dependence on renal dialysis: Secondary | ICD-10-CM | POA: Diagnosis present

## 2020-03-30 DIAGNOSIS — N186 End stage renal disease: Secondary | ICD-10-CM

## 2020-03-30 NOTE — Progress Notes (Signed)
    Postoperative Access Visit   History of Present Illness   Randy Oneal is a 54 y.o. year old male who presents for postoperative follow-up for: Left brachiocephalic Arteriovenous fistula creation by Dr. Donzetta Matters on 02/15/20. The patient's wounds are well healed.  The patient notes no steal symptoms.  The patient is able to complete their activities of daily living.   He currently is dialyzing via a right IJ TDC on Tues/ Thurs/Sat at the [facility] location. His Nephrologist is Dr. [name] at [facility]:   03/30/20 1526  BP: (!) 167/85  Pulse: 72  Resp: 16  Temp: (!) 97.3 F (36.3 C)  TempSrc: Temporal  SpO2: 100%  Weight: 176 lb (79.8 kg)  Height: 5\' 11"  (1.803 m)   Body mass index is 24.55 kg/m.  left arm Incision is well healed, 2+ radial pulse, hand grip is 5/5, sensation in digits is intact, palpable thrill, bruit can  be auscultated. The fistula is easily palpable in the left upper arm   Non invasive vascular lab study:  +------------+----------+-------------+----------+-------------------------  ----+  OUTFLOW VEINPSV (cm/s)Diameter (cm)Depth (cm)     Describe        +------------+----------+-------------+----------+-------------------------  ----+  Shoulder    142    0.74     0.46                   +------------+----------+-------------+----------+-------------------------  ----+  Prox UA          0.63     0.34  competing branch 0.41 65  cm/s  +------------+----------+-------------+----------+-------------------------  ----+  Mid UA     174    0.69     0.16  competing branch mid - d  0.26                             cm, 136 cm/s       +------------+----------+-------------+----------+-------------------------  ----+  Dist UA     266    0.92     0.30                     +------------+----------+-------------+----------+-------------------------  ----+  AC Fossa    421    0.57     0.76                   +------------+----------+-------------+----------+-------------------------  ----+      Medical Decision Making   Randy Oneal is a 54 y.o. year old male who presents s/p left brachiocephalic AV fistula 7/0/96 by Dr. Donzetta Matters. His arm is well healed. His fistula is patent and of adequate depth and diameter for cannulation. The fistula is easily palpable in the left upper arm with a good thrill  Patent is without signs or symptoms of steal syndrome  The patient's access will be ready for use 05/17/20  The patient's tunneled dialysis catheter can be removed when Nephrology is comfortable with the performance of the left AV fistula  The patient may follow up on a prn basis   Karoline Caldwell, PA-C [facility] Office: [phone]  Clinic MD: Dr. Donzetta Matters

## 2020-04-04 IMAGING — US US RENAL
1 series · 14 of 25 positions shown · non-contrast
Comparison: CT 05/22/2011

CLINICAL DATA: Chronic kidney disease stage 4

EXAM:
RENAL / URINARY TRACT ULTRASOUND COMPLETE

[Series 1: us renal · 0.24mm/px · 14 of 79 slices shown]
[im 1/79]
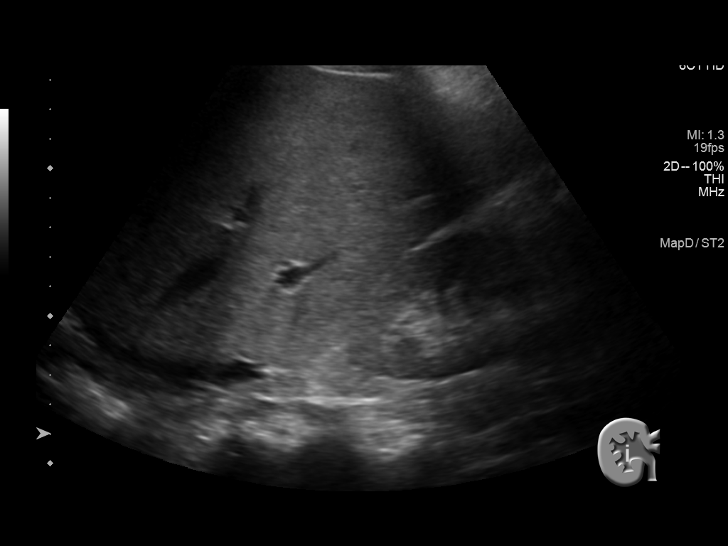
[im 7/79]
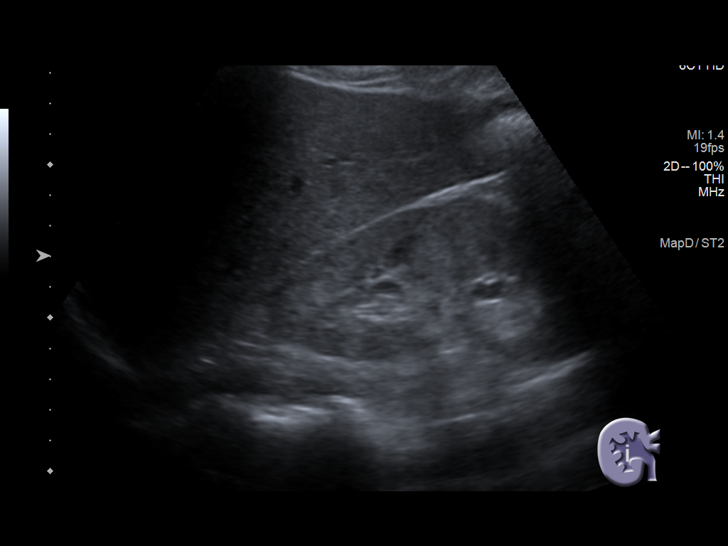
[im 14/79]
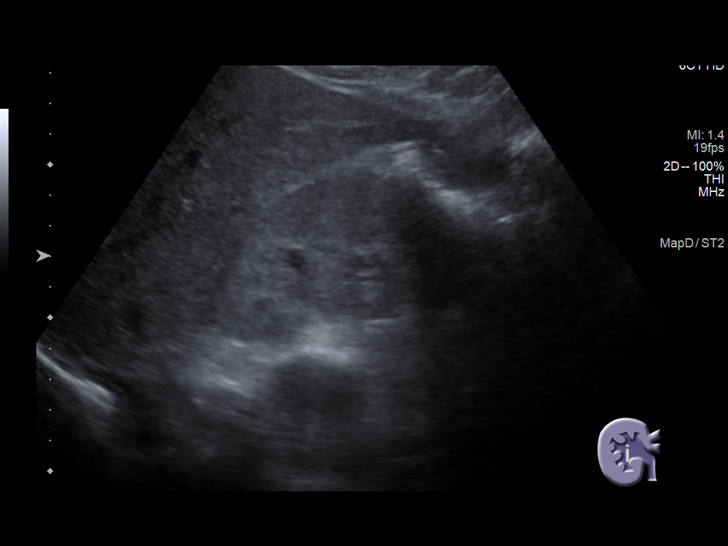
[im 20/79]
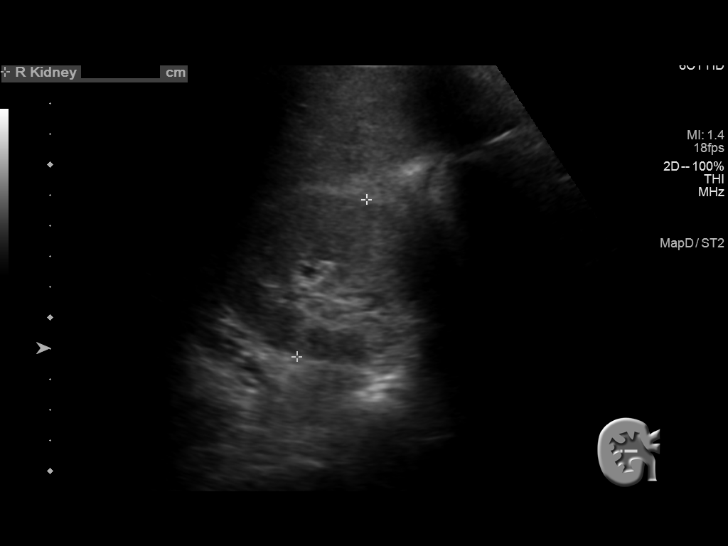
[im 27/79]
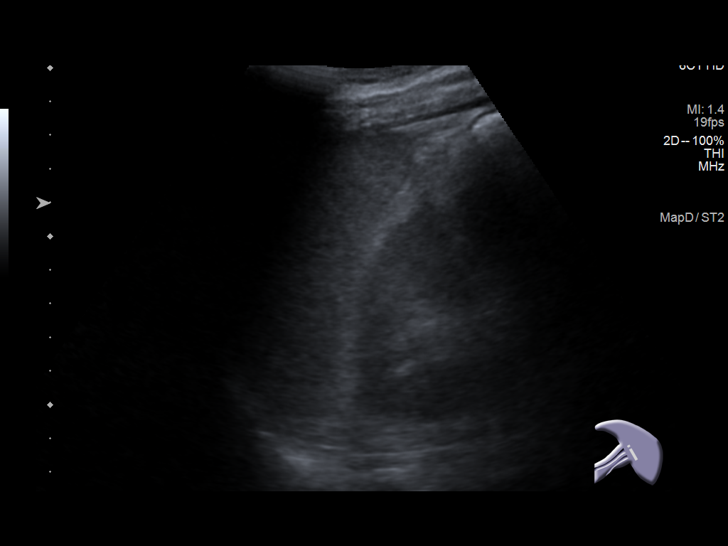
[im 30/79]
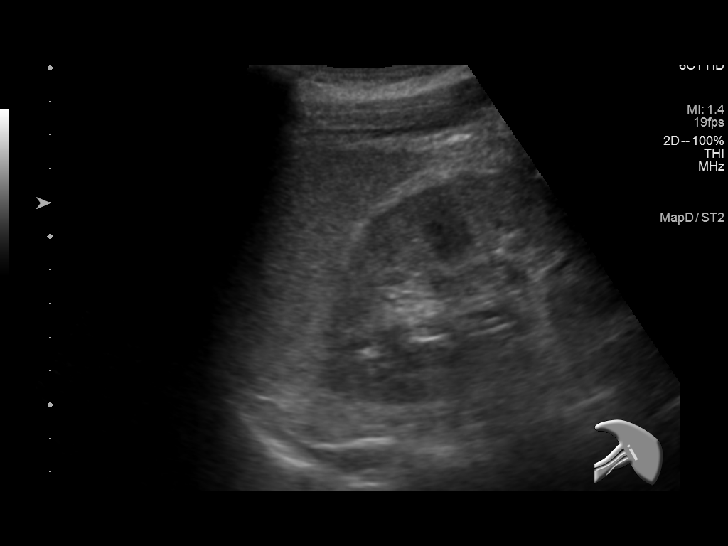
[im 36/79]
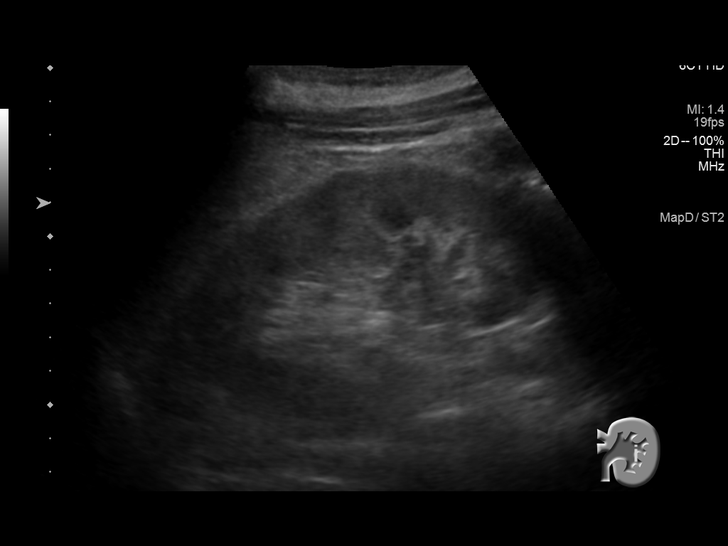
[im 43/79]
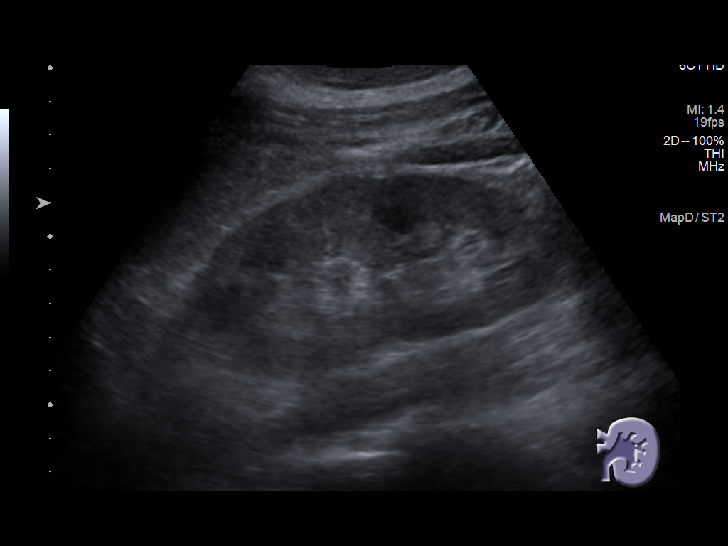
[im 49/79]
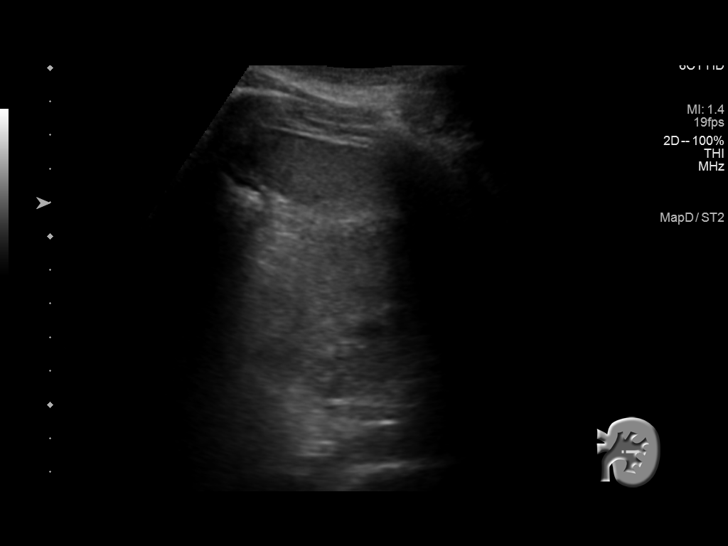
[im 53/79]
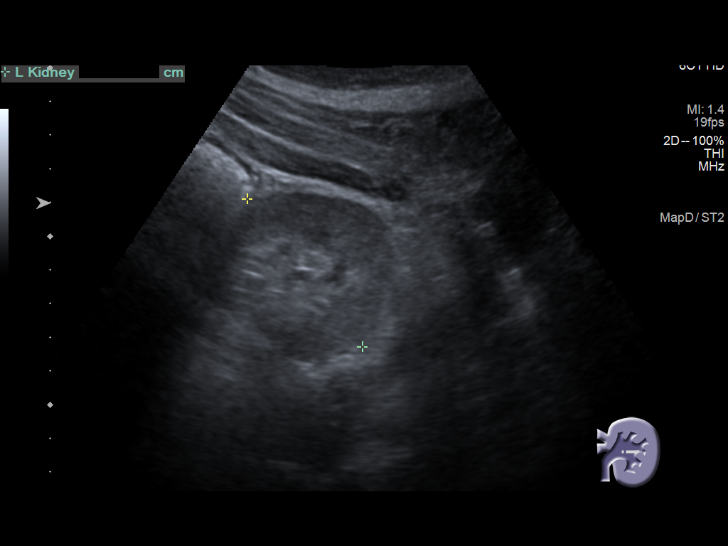
[im 59/79]
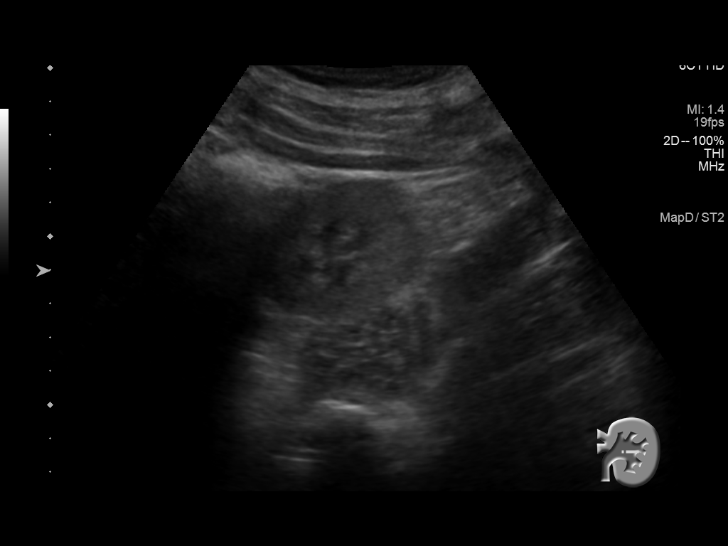
[im 66/79]
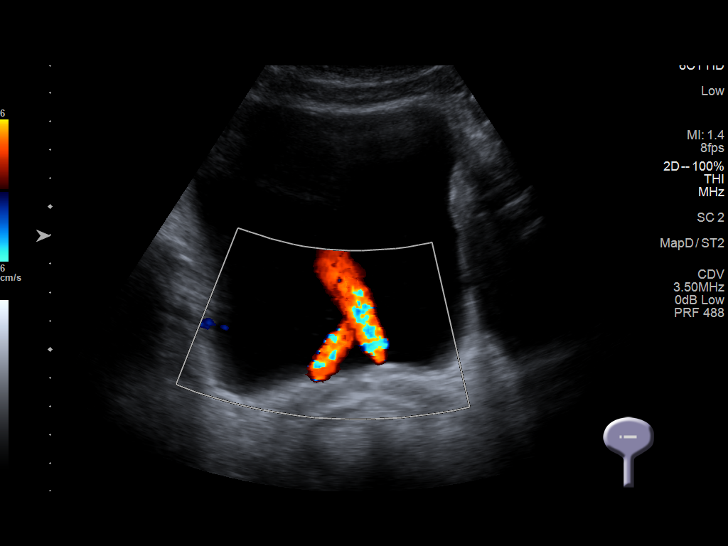
[im 72/79]
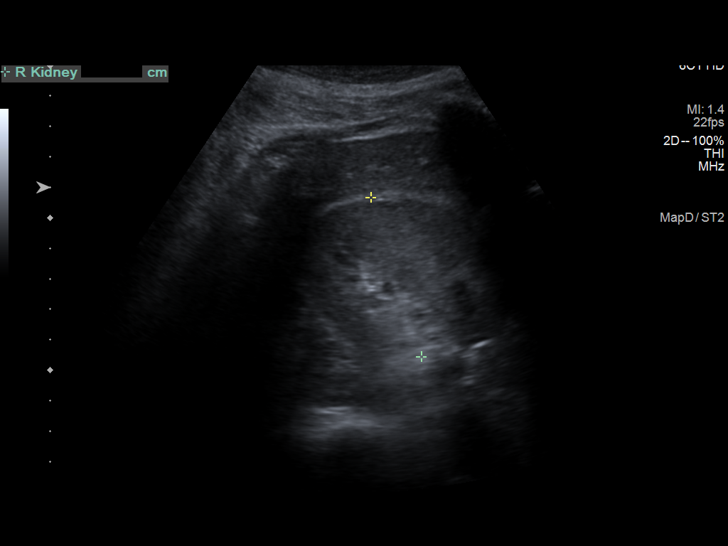
[im 79/79]
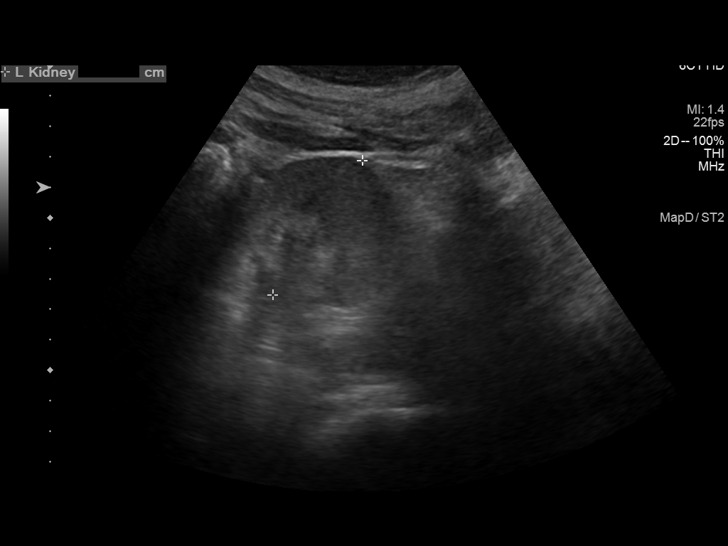

[14 of 25 positions shown; findings below may reference images not displayed]

FINDINGS: Right Kidney:

Renal measurements: 11.6 x 5.6 x 5.5 cm = volume: 206 mL. Diffusely
increased echotexture. No mass or hydronephrosis.

Left Kidney:

Renal measurements: 12.6 x 5.3 x 5.6 cm = volume: 164 mL. Diffusely
increased echotexture. No mass or hydronephrosis.

Bladder:

Appears normal for degree of bladder distention.

Other:

None
IMPRESSION: Increased echotexture in the kidneys compatible with chronic medical
renal disease. No hydronephrosis.

## 2020-04-09 IMAGING — US US BIOPSY
1 series · 13 of 15 positions shown · non-contrast
Comparison: Renal ultrasound-05/14/2019

INDICATION: Proteinuria of uncertain etiology. Please perform random renal
biopsy for tissue diagnostic purposes.

EXAM:
ULTRASOUND GUIDED RENAL BIOPSY

[Series 1: us biopsy · 13 of 15 slices shown]
[im 1/15]
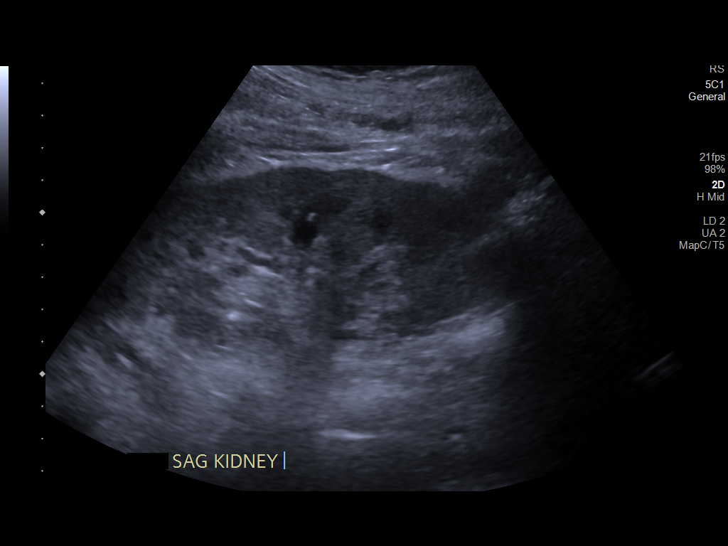
[im 2/15]
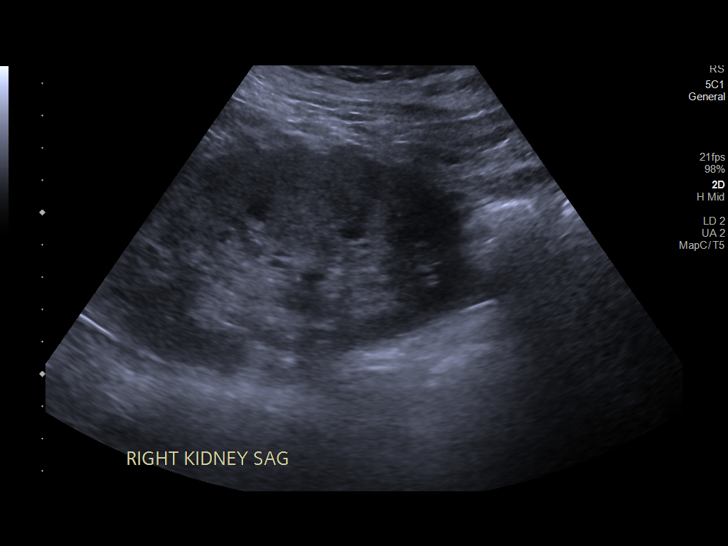
[im 3/15]
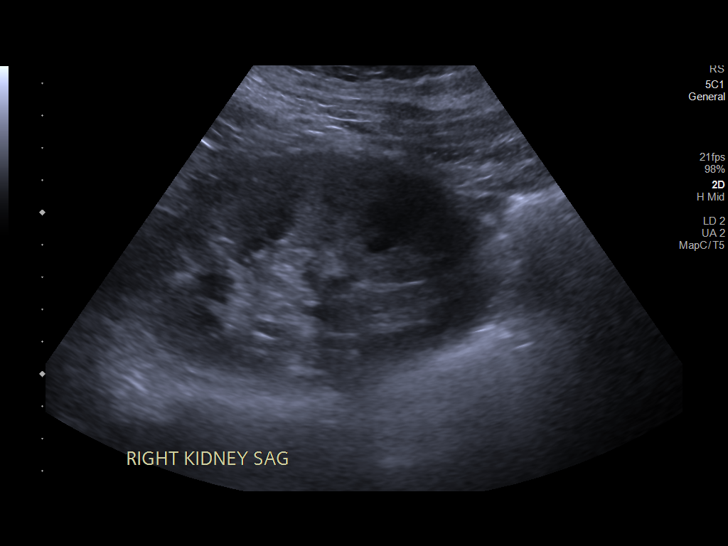
[im 5/15]
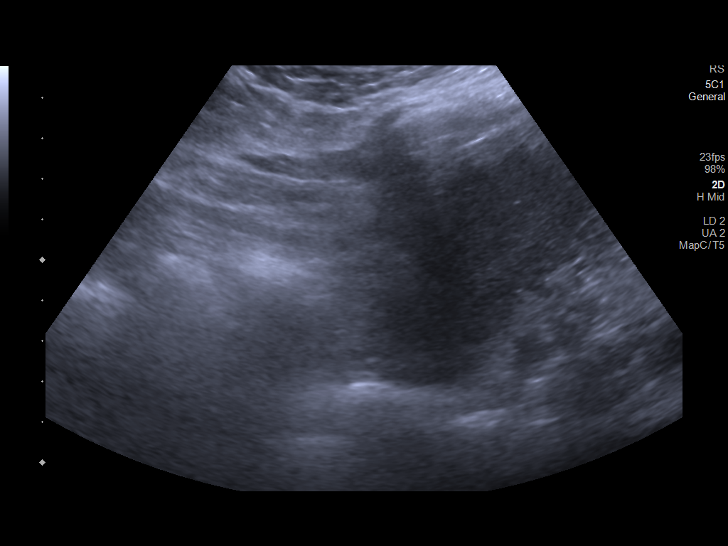
[im 6/15]
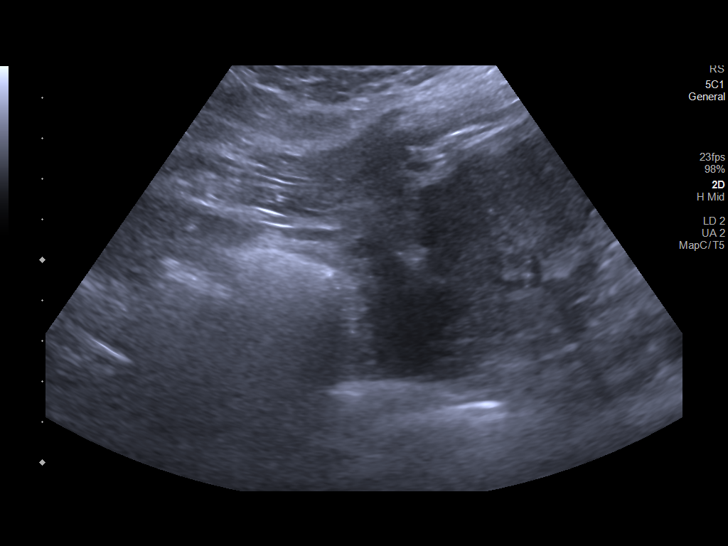
[im 7/15]
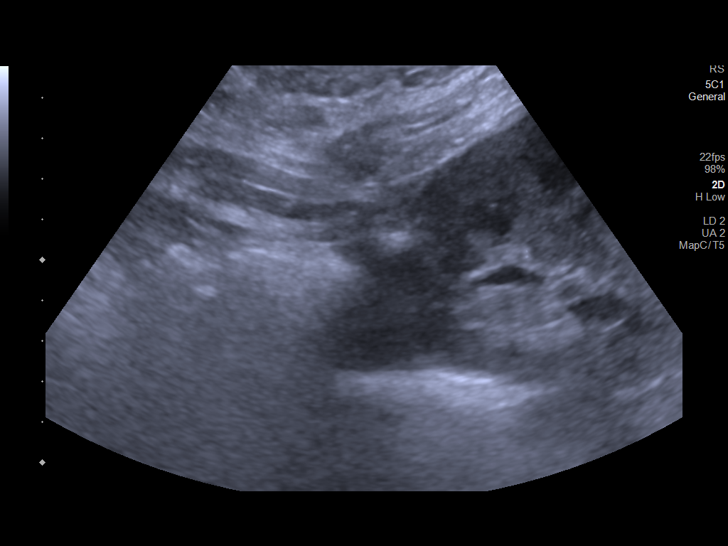
[im 8/15]
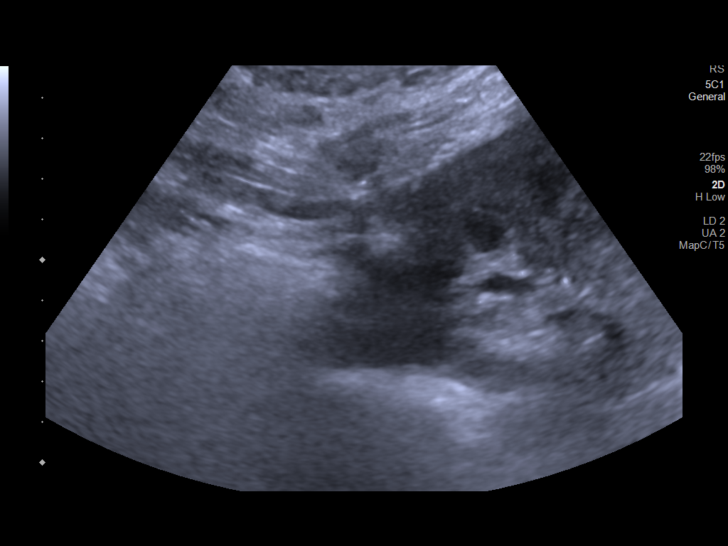
[im 9/15]
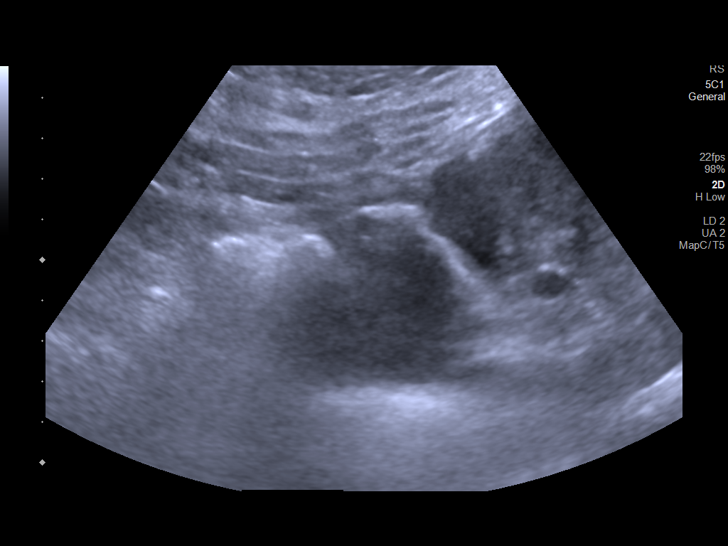
[im 10/15]
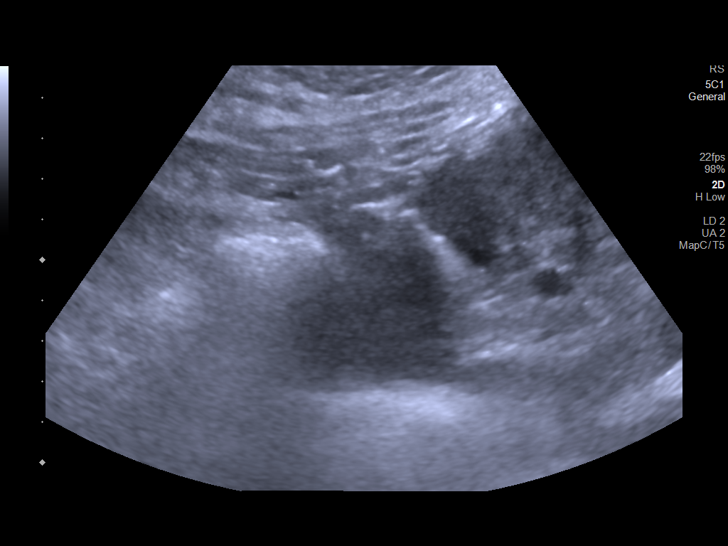
[im 11/15]
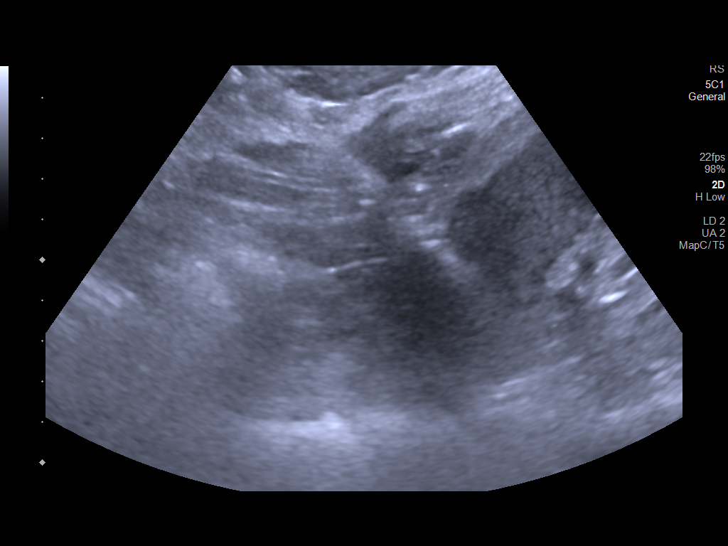
[im 13/15]
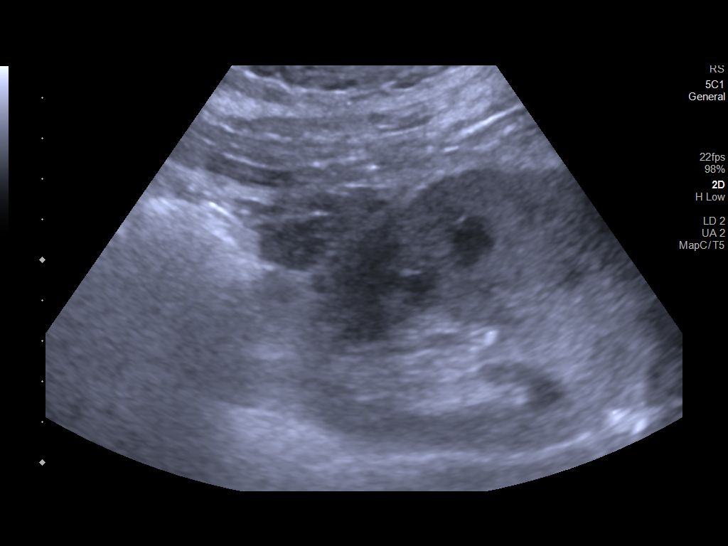
[im 14/15]
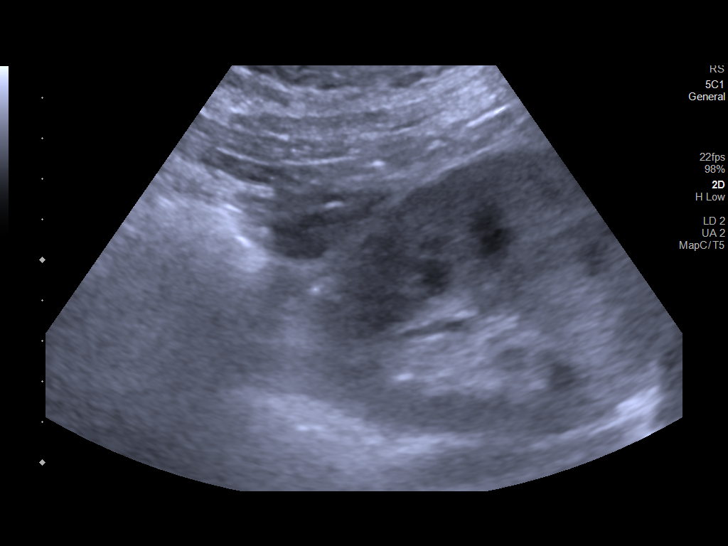
[im 15/15]
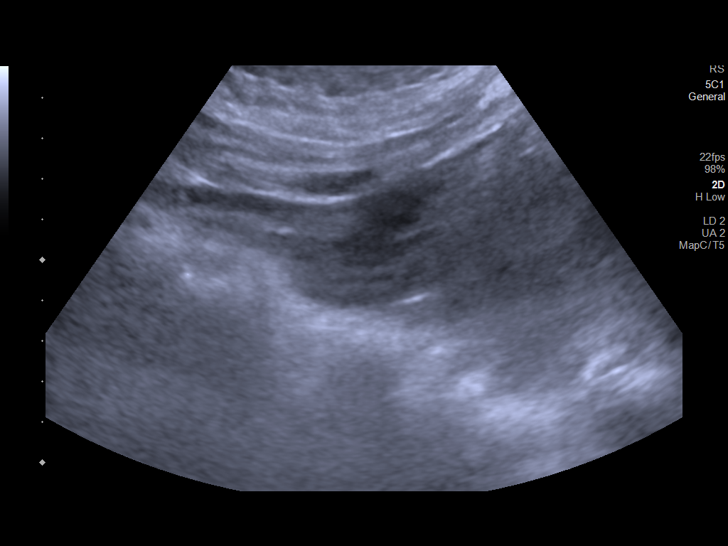

[13 of 15 positions shown; findings below may reference images not displayed]

MEDICATIONS:
None.

ANESTHESIA/SEDATION:
Fentanyl 75 mcg IV; Versed 1.5 mg IV

Total Moderate Sedation time: 15 minutes; The patient was
continuously monitored during the procedure by the interventional
radiology nurse under my direct supervision.

COMPLICATIONS:
None immediate.

PROCEDURE:
Informed written consent was obtained from the patient after a
discussion of the risks, benefits and alternatives to treatment. The
patient understands and consents the procedure. A timeout was
performed prior to the initiation of the procedure.

Ultrasound scanning was performed of the bilateral flanks. The
inferior pole of the left kidney was selected for biopsy due to
location and sonographic window. The procedure was planned. The
operative site was prepped and draped in the usual sterile fashion.
The overlying soft tissues were anesthetized with 1% lidocaine with
epinephrine. A 17 gauge core needle biopsy device was advanced into
the inferior cortex of the left kidney and 4 core biopsies were
obtained under direct ultrasound guidance. Images were saved for
documentation purposes. The biopsy device was removed and hemostasis
was obtained with manual compression. Post procedural scanning was
negative for significant post procedural hemorrhage or additional
complication. A dressing was placed. The patient tolerated the
procedure well without immediate post procedural complication.
IMPRESSION: Technically successful ultrasound guided left renal biopsy.

## 2020-04-11 ENCOUNTER — Ambulatory Visit: Payer: Medicaid Other | Admitting: Podiatry

## 2020-04-13 ENCOUNTER — Ambulatory Visit (INDEPENDENT_AMBULATORY_CARE_PROVIDER_SITE_OTHER): Payer: Medicare Other | Admitting: Podiatry

## 2020-04-13 ENCOUNTER — Other Ambulatory Visit: Payer: Self-pay

## 2020-04-13 DIAGNOSIS — Q666 Other congenital valgus deformities of feet: Secondary | ICD-10-CM

## 2020-04-13 DIAGNOSIS — L97511 Non-pressure chronic ulcer of other part of right foot limited to breakdown of skin: Secondary | ICD-10-CM

## 2020-04-13 DIAGNOSIS — E1142 Type 2 diabetes mellitus with diabetic polyneuropathy: Secondary | ICD-10-CM | POA: Diagnosis not present

## 2020-04-17 ENCOUNTER — Encounter: Payer: Self-pay | Admitting: Podiatry

## 2020-04-17 ENCOUNTER — Ambulatory Visit: Payer: MEDICAID | Admitting: Podiatry

## 2020-04-17 NOTE — Progress Notes (Signed)
Subjective:  Patient ID: Randy Oneal, male    DOB: Nov 18, 1965,  MRN: [id number]  Chief Complaint  Patient presents with  . Diabetic Ulcer      2 wk ulcer f/u     54 y.o. male presents for wound care.  Patient presents with a follow-up of right hallux IPJ ulceration limited to the breakdown of the skin.  Patient states is doing a lot better.  He has been keeping dressing on the foot.  He denies any other acute complaints.   Review of Systems: Negative except as noted in the HPI. Denies N/V/F/Ch.  Past Medical History:  Diagnosis Date  . Anemia   . Diabetes mellitus    Type 2   . Diabetic retinopathy of both eyes ([place]) 01/31/2019   Dr Coralyn Pear 01/2019  . ESRD (end stage renal disease) ([place])    [place] Frenisus  . Herpes simplex virus (HSV) infection    OU  . Hypertension   . Retinal detachment    OS  . Sepsis due to Streptococcus, group B ([place]) 10/05/2018    Current Outpatient Medications:  .  cinacalcet (SENSIPAR) 30 MG tablet, Take by mouth., Disp: , Rfl:  .  acetaminophen (TYLENOL) 500 MG tablet, Take 1,000 mg by mouth every 6 (six) hours as needed for moderate pain or headache., Disp: , Rfl:  .  amLODipine (NORVASC) 2.5 MG tablet, Take 1 tablet (2.5 mg total) by mouth daily., Disp: 30 tablet, Rfl: 3 .  atorvastatin (LIPITOR) 10 MG tablet, Take 1 tablet (10 mg total) by mouth daily., Disp: 30 tablet, Rfl: 5 .  brimonidine (ALPHAGAN) 0.2 % ophthalmic solution, Place 1 drop into both eyes daily., Disp: 10 mL, Rfl: 10 .  Bromfenac Sodium (PROLENSA) 0.07 % SOLN, Place 1 drop into both eyes 4 (four) times daily., Disp: 3 mL, Rfl: 10 .  calcium acetate (PHOSLO) 667 MG capsule, Take 1,334 mg by mouth 3 (three) times daily with meals., Disp: , Rfl:  .  Calcium Acetate 668 (169 Ca) MG TABS, TAKE 2 TABLETS BY MOUTH THREE TIMES DAILY WITH MEALS, Disp: , Rfl:  .  Continuous Blood Gluc Sensor ([place]) MISC, Use one sensor every 10 days., Disp: 3 each, Rfl: 2 .   Darbepoetin Alfa (ARANESP) 60 MCG/0.3ML SOSY injection, Inject 0.3 mLs (60 mcg total) into the vein every Monday with hemodialysis., Disp: , Rfl:  .  famotidine (PEPCID) 40 MG tablet, Take 0.5 tablets (20 mg total) by mouth at bedtime., Disp: 30 tablet, Rfl: 1 .  insulin lispro (HUMALOG KWIKPEN) 100 UNIT/ML KwikPen, Inject 0.06 mLs (6 Units total) into the skin daily with breakfast AND 0.07 mLs (7 Units total) 2 (two) times daily before lunch and supper. (Patient taking differently: inject 5-9 unit with each meal), Disp: 15 mL, Rfl: 11 .  Insulin Pen Needle (BD PEN NEEDLE NANO 2ND GEN) 32G X 4 MM MISC, Four times daily, Disp: 150 each, Rfl: 11 .  Methoxy PEG-Epoetin Beta (MIRCERA IJ), Mircera, Disp: , Rfl:  .  metoprolol tartrate (LOPRESSOR) 25 MG tablet, Take 1 tablet (25 mg total) by mouth 2 (two) times daily., Disp: 60 tablet, Rfl: 1 .  MICROLET LANCETS MISC, USE FOUR TIMES A DAY AS DIRECTED., Disp: 100 each, Rfl: 0 .  nystatin (MYCOSTATIN/NYSTOP) powder, Apply topically 2 (two) times daily. To groin and scrotum (Patient taking differently: Apply 1 g topically 2 (two) times daily as needed (rash). To groin and scrotum), Disp: 15 g, Rfl: 0 .  ONETOUCH ULTRA test strip, USE TO TEST 4 TIMES DAILY., Disp: 100 strip, Rfl: 1 .  prednisoLONE acetate (PRED FORTE) 1 % ophthalmic suspension, Place 1 drop into both eyes 4 (four) times daily., Disp: 15 mL, Rfl: 3 .  sucroferric oxyhydroxide (VELPHORO) 500 MG chewable tablet, Chew 500 mg by mouth 3 (three) times daily with meals., Disp: , Rfl:  .  TRESIBA FLEXTOUCH 100 UNIT/ML SOPN FlexTouch Pen, Inject 0.19 mLs (19 Units total) into the skin at bedtime. (Patient taking differently: Inject 22 Units into the skin at bedtime. ), Disp: 15 mL, Rfl: 1  Social History   Tobacco Use  Smoking Status Never Smoker  Smokeless Tobacco Never Used    Allergies  Allergen Reactions  . Tramadol Nausea And Vomiting   Objective:  There were no vitals filed for this  visit. There is no height or weight on file to calculate BMI. Constitutional Well developed. Well nourished.  Vascular Dorsalis pedis pulses palpable bilaterally. Posterior tibial pulses palpable bilaterally. Capillary refill normal to all digits.  No cyanosis or clubbing noted. Pedal hair growth normal.  Neurologic Normal speech. Oriented to person, place, and time. Protective sensation absent  Dermatologic  right hallux IPJ completely reepithelialized without any underlying ulceration.  No clinical signs of infection noted.  Orthopedic: No pain to palpation either foot.   Radiographs: None Assessment:   1. Toe ulcer, right, limited to breakdown of skin ([place])   2. Diabetic polyneuropathy associated with type 2 diabetes mellitus ([place])    Plan:  Patient was evaluated and treated and all questions answered.  Ulcer right hallux limited to the breakdown of the skin/partial-thickness -Clinically healed.  I discussed with him the importance of shoe gear modification as well as orthotics.  If any foot and ankle issues arises including recurrence of the ulceration I have asked him to come back and see me.  Patient states understanding -Dressed with Betadine wet-to-dry, DSD. -Continue off-loading with surgical shoe. -Patient was given a prescription to obtain orthotics from [facility] as to allow for offloading of the hallux IPJ as well as address the underlying pes planus deformity.  He was given another prescription to obtain orthotics. -Patient will continue her surgical shoe until he is in orthotics with proper offloading at the IPJ.   No follow-ups on file.

## 2020-05-09 ENCOUNTER — Telehealth: Payer: Self-pay | Admitting: Internal Medicine

## 2020-05-09 NOTE — Telephone Encounter (Signed)
Medication Refill Request  Did you call your pharmacy and request this refill first? Yes  . If patient has not contacted pharmacy first, instruct them to do so for future refills.  . Remind them that contacting the pharmacy for their refill is the quickest method to get the refill.  . Refill policy also stated that it will take anywhere between 24-72 hours to receive the refill.    Name of medication? tresiba and humalog  Is this a 90 day supply? yes  Name and location of pharmacy?  [facility], [place] Phone:  [phone]  Fax:  [phone]

## 2020-05-09 NOTE — Telephone Encounter (Signed)
Noted  

## 2020-05-09 NOTE — Telephone Encounter (Signed)
Last office visit 11/17/2018  Cancel/No-show? 2  Future office visit scheduled? no  Please advise on refill.

## 2020-05-16 ENCOUNTER — Telehealth: Payer: Self-pay | Admitting: Family Medicine

## 2020-05-16 NOTE — Telephone Encounter (Signed)
Pt's wife LMOVM asking when pt had his colonoscopy  Pt had it 06/24/2011  Tried to call to give info but no answer & voicemail is full

## 2020-05-25 ENCOUNTER — Ambulatory Visit: Payer: Medicaid Other | Admitting: Podiatry

## 2020-06-06 ENCOUNTER — Encounter (INDEPENDENT_AMBULATORY_CARE_PROVIDER_SITE_OTHER): Payer: Self-pay | Admitting: Ophthalmology

## 2020-06-06 ENCOUNTER — Other Ambulatory Visit: Payer: Self-pay

## 2020-06-06 ENCOUNTER — Encounter (INDEPENDENT_AMBULATORY_CARE_PROVIDER_SITE_OTHER): Payer: Medicare Other | Admitting: Ophthalmology

## 2020-06-06 ENCOUNTER — Other Ambulatory Visit: Payer: Self-pay | Admitting: Internal Medicine

## 2020-06-06 ENCOUNTER — Ambulatory Visit (INDEPENDENT_AMBULATORY_CARE_PROVIDER_SITE_OTHER): Payer: Medicare Other | Admitting: Ophthalmology

## 2020-06-06 DIAGNOSIS — H3343 Traction detachment of retina, bilateral: Secondary | ICD-10-CM | POA: Diagnosis not present

## 2020-06-06 DIAGNOSIS — H35033 Hypertensive retinopathy, bilateral: Secondary | ICD-10-CM

## 2020-06-06 DIAGNOSIS — H3581 Retinal edema: Secondary | ICD-10-CM

## 2020-06-06 DIAGNOSIS — H4921 Sixth [abducent] nerve palsy, right eye: Secondary | ICD-10-CM | POA: Diagnosis not present

## 2020-06-06 DIAGNOSIS — H4313 Vitreous hemorrhage, bilateral: Secondary | ICD-10-CM | POA: Diagnosis not present

## 2020-06-06 DIAGNOSIS — E113513 Type 2 diabetes mellitus with proliferative diabetic retinopathy with macular edema, bilateral: Secondary | ICD-10-CM | POA: Diagnosis not present

## 2020-06-06 DIAGNOSIS — H35352 Cystoid macular degeneration, left eye: Secondary | ICD-10-CM

## 2020-06-06 DIAGNOSIS — I10 Essential (primary) hypertension: Secondary | ICD-10-CM

## 2020-06-06 DIAGNOSIS — Z961 Presence of intraocular lens: Secondary | ICD-10-CM

## 2020-06-06 NOTE — Progress Notes (Addendum)
[facility] Note  06/06/2020     CHIEF COMPLAINT Patient presents for Retina Follow Up   HISTORY OF PRESENT ILLNESS: Randy Oneal is a 54 y.o. male who presents to the clinic today for:  HPI    Retina Follow Up    Patient presents with  Diabetic Retinopathy.  In both eyes.  This started weeks ago.  Severity is moderate.  Duration of weeks.  Since onset it is stable.  I, the attending physician,  performed the HPI with the patient and updated documentation appropriately.          Comments    Pt states vision is the same OU.  Pt denies eye pain or discomfort and denies any new or worsening floaters or fol OU.  Pt delayed to f/u due to loss of job and insurance.  Pt is now insured.       Last edited by Bernarda Caffey, MD on 06/06/2020  1:48 PM. (History)    Pt returns for first appt since January, pt last his job and his insurance, but has now gotten ins again, pt states he has gotten a fistula in his arm and a port in his chest and is on dialysis, he has dialysis T, Th, S, he states he has good and bad sessions, he states his vision is "kind of like the same", he states when it gets dark outside unless he has bright lights it is a "challenge" for him to see, pt is on PF BID OU  Referring physician: [name], MD [place],  [place] [address]  HISTORICAL INFORMATION:   Selected notes from the MEDICAL RECORD NUMBER Referred by Dr.Mark Gershon Crane for concern of decreased vision post cataract sx LEE:  Ocular Hx- PMH-   CURRENT MEDICATIONS: Current Outpatient Medications (Ophthalmic Drugs)  Medication Sig   brimonidine (ALPHAGAN) 0.2 % ophthalmic solution Place 1 drop into both eyes daily.   Bromfenac Sodium (PROLENSA) 0.07 % SOLN Place 1 drop into both eyes 4 (four) times daily.   prednisoLONE acetate (PRED FORTE) 1 % ophthalmic suspension Place 1 drop into both eyes 4 (four) times daily.   No current  facility-administered medications for this visit. (Ophthalmic Drugs)   Current Outpatient Medications (Other)  Medication Sig   acetaminophen (TYLENOL) 500 MG tablet Take 1,000 mg by mouth every 6 (six) hours as needed for moderate pain or headache.   amLODipine (NORVASC) 2.5 MG tablet Take 1 tablet (2.5 mg total) by mouth daily.   atorvastatin (LIPITOR) 10 MG tablet Take 1 tablet (10 mg total) by mouth daily.   calcium acetate (PHOSLO) 667 MG capsule Take 1,334 mg by mouth 3 (three) times daily with meals.   Calcium Acetate 668 (169 Ca) MG TABS TAKE 2 TABLETS BY MOUTH THREE TIMES DAILY WITH MEALS   cinacalcet (SENSIPAR) 30 MG tablet Take by mouth.   Continuous Blood Gluc Sensor (FREESTYLE LIBRE SENSOR SYSTEM) MISC Use one sensor every 10 days.   Darbepoetin Alfa (ARANESP) 60 MCG/0.3ML SOSY injection Inject 0.3 mLs (60 mcg total) into the vein every Monday with hemodialysis.   famotidine (PEPCID) 40 MG tablet Take 0.5 tablets (20 mg total) by mouth at bedtime.   insulin lispro (HUMALOG KWIKPEN) 100 UNIT/ML KwikPen Inject 0.06 mLs (6 Units total) into the skin daily with breakfast AND 0.07 mLs (7 Units total) 2 (two) times daily before lunch and supper. (Patient taking differently: inject 5-9 unit with each meal)  Insulin Pen Needle (BD PEN NEEDLE NANO 2ND GEN) 32G X 4 MM MISC Four times daily   Methoxy PEG-Epoetin Beta (MIRCERA IJ) Mircera   metoprolol tartrate (LOPRESSOR) 25 MG tablet Take 1 tablet (25 mg total) by mouth 2 (two) times daily.   MICROLET LANCETS MISC USE FOUR TIMES A DAY AS DIRECTED.   nystatin (MYCOSTATIN/NYSTOP) powder Apply topically 2 (two) times daily. To groin and scrotum (Patient taking differently: Apply 1 g topically 2 (two) times daily as needed (rash). To groin and scrotum)   ONETOUCH ULTRA test strip USE TO TEST 4 TIMES DAILY.   sucroferric oxyhydroxide (VELPHORO) 500 MG chewable tablet Chew 500 mg by mouth 3 (three) times daily with meals.    TRESIBA FLEXTOUCH 100 UNIT/ML SOPN FlexTouch Pen Inject 0.19 mLs (19 Units total) into the skin at bedtime. (Patient taking differently: Inject 22 Units into the skin at bedtime. )   No current facility-administered medications for this visit. (Other)      REVIEW OF SYSTEMS: ROS    Positive for: Genitourinary, Endocrine, Eyes   Negative for: Constitutional, Gastrointestinal, Neurological, Skin, Musculoskeletal, HENT, Cardiovascular, Respiratory, Psychiatric, Allergic/Imm, Heme/Lymph   Last edited by Doneen Poisson on 06/06/2020  1:19 PM. (History)       ALLERGIES Allergies  Allergen Reactions   Tramadol Nausea And Vomiting    PAST MEDICAL HISTORY Past Medical History:  Diagnosis Date   Anemia    Diabetes mellitus    Type 2    Diabetic retinopathy of both eyes ([place]) 01/31/2019   Dr Coralyn Pear 01/2019   ESRD (end stage renal disease) ([place])    [place] Frenisus   Herpes simplex virus (HSV) infection    OU   Hypertension    Retinal detachment    OS   Sepsis due to Streptococcus, group B ([place]) 10/05/2018   Past Surgical History:  Procedure Laterality Date   25 GAUGE PARS PLANA VITRECTOMY WITH 20 GAUGE MVR PORT FOR MACULAR HOLE Right 02/24/2019   Procedure: 25 GAUGE PARS PLANA VITRECTOMY WITH 20 GAUGE MVR PORT FOR MACULAR HOLE;  Surgeon: Bernarda Caffey, MD;  Location: [facility];  Service: Ophthalmology;  Laterality: Right;   APPLICATION OF WOUND VAC Left 08/27/2018   Procedure: APPLICATION OF WOUND VAC, left neck;  Surgeon: Gaye Pollack, MD;  Location: [facility];  Service: Thoracic;  Laterality: Left;   APPLICATION OF WOUND VAC Left 08/30/2018   Procedure: APPLICATION OF WOUND VAC;  Surgeon: Gaye Pollack, MD;  Location: [facility];  Service: Thoracic;  Laterality: Left;   AV FISTULA PLACEMENT Left 02/15/2020   Procedure: LEFT ARM ARTERIOVENOUS (AV) FISTULA CREATION;  Surgeon: Waynetta Sandy, MD;  Location: [facility];  Service: Vascular;  Laterality: Left;   CATARACT  EXTRACTION Bilateral    CATARACT EXTRACTION W/ INTRAOCULAR LENS  IMPLANT, BILATERAL     COLONOSCOPY  06/24/2011   Procedure: COLONOSCOPY;  Surgeon: [name], MD;  [facility];  Service: Endoscopy;  Laterality: N/A;  8:30 AM   EYE SURGERY     GAS/FLUID EXCHANGE Left 03/31/2019   Procedure: Gas/Fluid Exchange;  Surgeon: Bernarda Caffey, MD;  Location: [facility];  Service: Ophthalmology;  Laterality: Left;   INJECTION OF SILICONE OIL  6/57/8469   Procedure: Injection Of Silicone Oil;  Surgeon: Bernarda Caffey, MD;  Location: [facility];  Service: Ophthalmology;;   INSERTION OF DIALYSIS CATHETER Right 12/05/2019   Procedure: INSERTION OF DIALYSIS CATHETER RIGHT SUBCLAVIAN;  Surgeon: Virl Cagey, MD;  [facility];  Service: General;  Laterality: Right;   INSERTION OF DIALYSIS CATHETER Right 12/07/2019   Procedure: Minor Dialysis catheter in place- need to reposition/ adjust catheter to help with flows;  Surgeon: Virl Cagey, MD;  [facility];  Service: General;  Laterality: Right;  procedure room case with 1% lidocaine, full sterile drape,  towels, full gown, large chloraprep, 4-0 Monocryl and dermabond    INSERTION OF DIALYSIS CATHETER Right 12/08/2019   Procedure: INSERTION OF DIALYSIS CATHETER EXCHANGE;  Surgeon: Virl Cagey, MD;  [facility];  Service: General;  Laterality: Right;   MEMBRANE PEEL Right 02/24/2019   Procedure: Antoine Primas;  Surgeon: Bernarda Caffey, MD;  Location: [facility];  Service: Ophthalmology;  Laterality: Right;   PARS PLANA VITRECTOMY Left 03/31/2019   Procedure: PARS PLANA VITRECTOMY WITH 25 GAUGE WITH MEMBRANE PEEL;  Surgeon: Bernarda Caffey, MD;  Location: [facility];  Service: Ophthalmology;  Laterality: Left;   PHOTOCOAGULATION WITH LASER Right 02/24/2019   Procedure: Photocoagulation With Laser;  Surgeon: Bernarda Caffey, MD;  Location: [facility];  Service: Ophthalmology;  Laterality: Right;   PHOTOCOAGULATION WITH LASER Left 03/31/2019    Procedure: Photocoagulation With Laser;  Surgeon: Bernarda Caffey, MD;  Location: [facility];  Service: Ophthalmology;  Laterality: Left;   RETINAL DETACHMENT SURGERY Left 03/31/2019   TRD Repair - Dr. Bernarda Caffey   SILICON OIL REMOVAL Right 3/84/6659   Procedure: Silicon Oil Removal;  Surgeon: Bernarda Caffey, MD;  Location: [facility];  Service: Ophthalmology;  Laterality: Right;   STERNAL WOUND DEBRIDEMENT Left 08/27/2018   Procedure: Incision and DEBRIDEMENT Left Chest, Neck and Mediastinum;  Surgeon: Gaye Pollack, MD;  [facility];  Service: Thoracic;  Laterality: Left;   STERNAL WOUND DEBRIDEMENT Left 08/30/2018   Procedure: WOUND VAC CHANGE, LEFT CHEST AND NECK, POSSIBLE DEBRIDEMENT;  Surgeon: Gaye Pollack, MD;  [facility];  Service: Thoracic;  Laterality: Left;    FAMILY HISTORY Family History  Problem Relation Age of Onset   Hypertension Mother    Colon cancer Neg Hx     SOCIAL HISTORY Social History   Tobacco Use   Smoking status: Never Smoker   Smokeless tobacco: Never Used  Vaping Use   Vaping Use: Never used  Substance Use Topics   Alcohol use: No   Drug use: No         OPHTHALMIC EXAM:  Base Eye Exam    Visual Acuity (Snellen - Linear)      Right Left   Dist [place] 20/400 20/300 -1   Dist ph [place] 20/300 -1 20/200 -1       Tonometry (Tonopen, 1:26 PM)      Right Left   Pressure 14 15       Pupils      Dark Light Shape React APD   Right 4 3 Irregular Minimal 0   Left 3 2 Irregular Minimal 0       Visual Fields      Left Right   Restrictions Partial outer superior temporal, inferior temporal, superior nasal, inferior nasal deficiencies Partial outer superior temporal, inferior temporal, superior nasal, inferior nasal deficiencies       Extraocular Movement      Right Left    Full Full       Neuro/Psych    Oriented x3: Yes   Mood/Affect: Normal       Dilation    Both eyes: 1.0% Mydriacyl, 2.5% Phenylephrine @ 1:26 PM  Slit  Lamp and Fundus Exam    Slit Lamp Exam      Right Left   Lids/Lashes mild Meibomian gland dysfunction mild Meibomian gland dysfunction   Conjunctiva/Sclera subconj silicon oil bubbles, Chemosis White and quiet   Cornea 2-3+ inferior PEE, Well healed cataract wound Clear, Well healed cataract wound, 2+ Punctate epithelial erosions   Anterior Chamber Deep and quiet, no cell or flare Deep and quiet, no cell or flare   Iris slightly irregular dilation, posterior synchiae from 3:00-0600, 0800-0900 and 1100-1200 round, focal Posterior synechiae at 1030   Lens PC IOL in good position, pigment deposition, 1+ PCO, mild pigment deposition on optic PC IOL in good position, mild pigment deposition   Vitreous Post vit, silicon oil bubble ~74% post vitrectomy, good silicone oil fill       Fundus Exam      Right Left   Disc 2+pallor, sharp rim, mild fibrosis along ST rim +pallor, sharp rim, mild heme at 0300   C/D Ratio 0.2 0.2   Macula Flat under oil, scattered MA/IRH Flat under oil, fibrosis/edema, scattered IRH, focal bands of PRF with traction eminating from ST macula   Vessels attenuated, Tortuous Attenuated, Tortuous   Periphery Attached, dense 360 PRP laser, scattered fibrosis Attached, good 360 PRP laser changes          IMAGING AND PROCEDURES  Imaging and Procedures for @TODAY @  OCT, Retina - OU - Both Eyes       Right Eye Quality was good. Central Foveal Thickness: 423. Progression has improved. Findings include preretinal fibrosis, abnormal foveal contour, subretinal fluid, outer retinal atrophy, epiretinal membrane, intraretinal fluid (interval improvement in IRF, but still with residual macular edema).   Left Eye Quality was good. Central Foveal Thickness: 377. Progression has worsened. Findings include vitreous traction, preretinal fibrosis, intraretinal fluid, abnormal foveal contour, intraretinal hyper-reflective material, epiretinal membrane, no SRF (Interval improvement in IRF;  small central pocket of SRF).   Notes *Images captured and stored on drive  Diagnosis / Impression:  OD: Macula reattached under oil, interval improvement in IRF, but still with residual macular edema OS: interval improvement in IRF/CME; +ERM   Clinical management:  See below  Abbreviations: NFP - Normal foveal profile. CME - cystoid macular edema. PED - pigment epithelial detachment. IRF - intraretinal fluid. SRF - subretinal fluid. EZ - ellipsoid zone. ERM - epiretinal membrane. ORA - outer retinal atrophy. ORT - outer retinal tubulation. SRHM - subretinal hyper-reflective material                 ASSESSMENT/PLAN:    ICD-10-CM   1. Proliferative diabetic retinopathy of both eyes with macular edema associated with type 2 diabetes mellitus ([place])  B44.9675   2. 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 Vitreous hemorrhage of both eyes ([place])  H43.13   5. Retinal edema  H35.81 OCT, Retina - OU - Both Eyes  6. Essential hypertension  I10   7. Hypertensive retinopathy of both eyes  H35.033   8. Pseudophakia of both eyes  Z96.1     1-5. Proliferative diabetic retinopathy with DME, TRD, and vitreous hemorrhage OU (OD > OS)  - lost to f/u from Jan 2021 to Oct 2021 due to loss of insurance coverage  - pt with complex medical history with hospitalization in Jan-Feb 2020 for bacteremia and abscess  - history of poor glycemic control for years  - s/p IVA  OD #1 (06.20.20), #2 (07.01.20), #3 (09.11.20), #4 (10.09.20), #5 (11.06.20)  - s/p IVA OS #1 (06.20.20), #2 (07.01.20), #3 (08.13.20), #4 (09.11.20)  #5 (11.06.20)  - s/p IVE OU #1 (12.04.20) - sample; #2 (01.06.21)  - s/p STK OS #1 (10.09.20)  - s/p PRP OS (06.10.20)  - exam showed diffuse vitreous and preretinal hemorrhage + scattered fibrosis OU   - pre-op clearing of VH OD improved visualization of posterior pole --+macular TRD OD; and ?regressing NVD OS  - pre-op OCT  shows preretinal fibrosis w/ traction OU (OD > OS)  - s/p PPV/PFC/EL/FAX/silicone oil OD, 32.67.12  - s/p subconj silicon oil removal OD 8.20.20  - s/p PPV/EL/FAX/silicone oil OS, 45.80.99  - BCVA down OU OD 20/300; OS 20/200             - OD: improved fibrosis and edema, retina attached and in good position under oil, +macular edema, slightly improved; residual pockets of inferior SRF improved  - OS: interval progression of fibrosis temporal macula -- new fibrotic bands under oil  - [employer] benefits investigation started 12.4.20 -- approved as of 01.06.21, but now with new insurance -- would need repeat benefits investigation  - f/u 4 weeks -- DFE/OCT/FA, possible injection  6,7. Hypertensive retinopathy OU  - discussed importance of tight BP control  - monitor  8. Pseudophakia OU  - s/p CE/IOL OU (Dr. Gershon Crane)  Ophthalmic Meds Ordered this visit:  No orders of the defined types were placed in this encounter.     Return in about 4 weeks (around 07/04/2020) for f/u PDR OU, DFE, OCT, FA.  There are no Patient Instructions on file for this visit.   Explained the diagnoses, plan, and follow up with the patient and they expressed understanding.  Patient expressed understanding of the importance of proper follow up care.   This document serves as a record of services personally performed by Gardiner Sleeper, MD, PhD. It was created on their behalf by San Jetty. Owens Shark, OA an ophthalmic technician. The creation of this record is the provider's dictation and/or activities during the visit.    Electronically signed by: San Jetty. Owens Shark, [place] 10.27.2021 11:21 PM  Gardiner Sleeper, M.D., Ph.D. Diseases & Surgery of the Retina and Vitreous [facility]  I have reviewed the above documentation for accuracy and completeness, and I agree with the above. Gardiner Sleeper, M.D., Ph.D. 06/07/20 11:21 PM  Abbreviations: M myopia (nearsighted); A astigmatism; H hyperopia  (farsighted); P presbyopia; Mrx spectacle prescription;  CTL contact lenses; OD right eye; OS left eye; OU both eyes  XT exotropia; ET esotropia; PEK punctate epithelial keratitis; PEE punctate epithelial erosions; DES dry eye syndrome; MGD meibomian gland dysfunction; ATs artificial tears; PFAT's preservative free artificial tears; [place] nuclear sclerotic cataract; PSC posterior subcapsular cataract; ERM epi-retinal membrane; PVD posterior vitreous detachment; RD retinal detachment; DM diabetes mellitus; DR diabetic retinopathy; NPDR non-proliferative diabetic retinopathy; PDR proliferative diabetic retinopathy; CSME clinically significant macular edema; DME diabetic macular edema; dbh dot blot hemorrhages; CWS cotton wool spot; POAG primary open angle glaucoma; C/D cup-to-disc ratio; HVF humphrey visual field; GVF goldmann visual field; OCT optical coherence tomography; IOP intraocular pressure; BRVO Branch retinal vein occlusion; CRVO central retinal vein occlusion; CRAO central retinal artery occlusion; BRAO branch retinal artery occlusion; RT retinal tear; SB scleral buckle; PPV pars plana vitrectomy; VH Vitreous hemorrhage; PRP panretinal laser photocoagulation; IVK intravitreal kenalog; VMT vitreomacular traction; MH Macular hole;  NVD neovascularization of the  disc; NVE neovascularization elsewhere; AREDS age related eye disease study; ARMD age related macular degeneration; POAG primary open angle glaucoma; EBMD epithelial/anterior basement membrane dystrophy; ACIOL anterior chamber intraocular lens; IOL intraocular lens; PCIOL posterior chamber intraocular lens; Phaco/IOL phacoemulsification with intraocular lens placement; [place] photorefractive keratectomy; LASIK laser assisted in situ keratomileusis; HTN hypertension; DM diabetes mellitus; COPD chronic obstructive pulmonary disease

## 2020-06-06 NOTE — Telephone Encounter (Signed)
Last OV and refill 11/17/2018  Please advise refill on Rx Tresiba and Humalog [employer]

## 2020-06-06 NOTE — Telephone Encounter (Signed)
Asked patient if could come sooner (we had a couple times opened this week) but he was unable to come then - LMTCB to inform him we need to schedule sooner in order to get the Rx filled.

## 2020-06-06 NOTE — Telephone Encounter (Signed)
Medication Refill Request  Did you call your pharmacy and request this refill first? Yes   If patient has not contacted pharmacy first, instruct them to do so for future refills.   Remind them that contacting the pharmacy for their refill is the quickest method to get the refill.   Refill policy also stated that it will take anywhere between 24-72 hours to receive the refill.    Name of medication? tresiba and humalog Randy Oneal  Is this a 31 day supply? Until his next appointment - 07/11/20  Name and location of pharmacy?  [facility], [place] - [place], [place] [address]  Phone:  [phone] Fax:  [phone]

## 2020-06-07 NOTE — Telephone Encounter (Signed)
Check the availability appt which in Dec1, but have opening Nov 4 @ 11:30 acute slot. Please advise

## 2020-06-08 ENCOUNTER — Telehealth: Payer: Self-pay | Admitting: Family Medicine

## 2020-06-08 MED ORDER — INSULIN LISPRO (1 UNIT DIAL) 100 UNIT/ML (KWIKPEN): PEN_INJECTOR | SUBCUTANEOUS | 0 refills | Status: DC

## 2020-06-08 MED ORDER — TRESIBA FLEXTOUCH 100 UNIT/ML ~~LOC~~ SOPN: 19.0000 [IU] | PEN_INJECTOR | Freq: Every day | SUBCUTANEOUS | 0 refills | Status: DC

## 2020-06-08 NOTE — Addendum Note (Signed)
Addended by: Erven Colla on: 06/08/2020 01:05 PM   Modules accepted: Orders

## 2020-06-08 NOTE — Addendum Note (Signed)
Addended by: Jacqualin Combes on: 06/08/2020 03:56 PM   Modules accepted: Orders

## 2020-06-08 NOTE — Telephone Encounter (Signed)
Pt contacted office. Pt states that at dialysis yesterday his blood sugar got up to 900. Pt states that sugar has since come down to 260. Pt has appt with endocrinologist on Jul 11 2020. Pt states he feels fine. Pt is out of Humalog and Tyler Aas and is wanting to know if we can sent in a refill until his sees his endocrinologist. Please advise. Thank you  [employer]  [phone]

## 2020-06-08 NOTE — Telephone Encounter (Signed)
Message left for patient to return my call.  

## 2020-06-08 NOTE — Telephone Encounter (Signed)
Left message to return call 

## 2020-06-08 NOTE — Telephone Encounter (Signed)
Endocrinology is refusing to fill his meds till he is seen but the soonest they can see him is Dec 1st. They called with a appt yest but he couldn't go because he does hemodialysis Tues, Thurs and Sat all day. He has been out of his meds for one month. Patient doesn't know what to do

## 2020-06-08 NOTE — Telephone Encounter (Signed)
I sent a note to his endocrinologist.  But I will give 30 days worth of medication as a 1 time courtesy but any further diabetic meds needs to get filled by his endocrinologist. Pt needs to keep the appt on Dec 1 with them.   Thanks,   Dr. Lovena Le

## 2020-06-08 NOTE — Telephone Encounter (Signed)
Spoken to patient and notified that we will keep appointment as schedule on 07/11/2020. Sent refills as instructed from Dr Kelton Pillar.

## 2020-06-08 NOTE — Telephone Encounter (Signed)
Patient notified and verbalized understanding. 

## 2020-06-29 ENCOUNTER — Other Ambulatory Visit: Payer: Self-pay

## 2020-06-29 ENCOUNTER — Encounter: Payer: Self-pay | Admitting: Podiatry

## 2020-06-29 ENCOUNTER — Ambulatory Visit (INDEPENDENT_AMBULATORY_CARE_PROVIDER_SITE_OTHER): Payer: Medicare Other | Admitting: Podiatry

## 2020-06-29 DIAGNOSIS — M2031 Hallux varus (acquired), right foot: Secondary | ICD-10-CM | POA: Diagnosis not present

## 2020-06-29 DIAGNOSIS — M2041 Other hammer toe(s) (acquired), right foot: Secondary | ICD-10-CM

## 2020-06-29 DIAGNOSIS — M2042 Other hammer toe(s) (acquired), left foot: Secondary | ICD-10-CM | POA: Diagnosis not present

## 2020-06-29 DIAGNOSIS — E1142 Type 2 diabetes mellitus with diabetic polyneuropathy: Secondary | ICD-10-CM

## 2020-06-29 NOTE — Progress Notes (Signed)
[facility] Note  07/04/2020     CHIEF COMPLAINT Patient presents for Retina Follow Up   HISTORY OF PRESENT ILLNESS: Randy Oneal is a 54 y.o. male who presents to the clinic today for:  HPI    Retina Follow Up    Patient presents with  Diabetic Retinopathy.  In both eyes.  This started 4 weeks ago.  I, the attending physician,  performed the HPI with the patient and updated documentation appropriately.          Comments    Patient here for 4 weeks retina follow up for PDR OU. Patient states vision about the same. No worse. No eye pain.        Last edited by Bernarda Caffey, MD on 07/04/2020  1:06 PM. (History)    Pt states vision is stable, he is still going through dialysis, he states his blood sugar spiked back in October (900 according to his dialysis dr), pt states he was out of his medication for about 2 weeks during that time, pt states he tried to make an appt with his endocrinologist at that time, but they could not get him in until December, his dialysis team made some calls and got his medication refilled  Referring physician: [name], MD [place],   [id number]  HISTORICAL INFORMATION:   Selected notes from the MEDICAL RECORD NUMBER Referred by Dr.Mark Gershon Crane for concern of decreased vision post cataract sx LEE:  Ocular Hx- PMH-   CURRENT MEDICATIONS: Current Outpatient Medications (Ophthalmic Drugs)  Medication Sig   brimonidine (ALPHAGAN) 0.2 % ophthalmic solution Place 1 drop into both eyes daily.   Bromfenac Sodium (PROLENSA) 0.07 % SOLN Place 1 drop into both eyes 4 (four) times daily.   prednisoLONE acetate (PRED FORTE) 1 % ophthalmic suspension Place 1 drop into both eyes 4 (four) times daily.   No current facility-administered medications for this visit. (Ophthalmic Drugs)   Current Outpatient Medications (Other)  Medication Sig   acetaminophen (TYLENOL) 500 MG tablet Take 1,000  mg by mouth every 6 (six) hours as needed for moderate pain or headache.   amLODipine (NORVASC) 2.5 MG tablet Take 1 tablet (2.5 mg total) by mouth daily.   atorvastatin (LIPITOR) 10 MG tablet Take 1 tablet (10 mg total) by mouth daily.   calcium acetate (PHOSLO) 667 MG capsule Take 1,334 mg by mouth 3 (three) times daily with meals.   Calcium Acetate 668 (169 Ca) MG TABS TAKE 2 TABLETS BY MOUTH THREE TIMES DAILY WITH MEALS   cinacalcet (SENSIPAR) 30 MG tablet Take by mouth.   Continuous Blood Gluc Sensor (FREESTYLE LIBRE SENSOR SYSTEM) MISC Use one sensor every 10 days.   Darbepoetin Alfa (ARANESP) 60 MCG/0.3ML SOSY injection Inject 0.3 mLs (60 mcg total) into the vein every Monday with hemodialysis.   famotidine (PEPCID) 40 MG tablet Take 0.5 tablets (20 mg total) by mouth at bedtime.   insulin lispro (HUMALOG KWIKPEN) 100 UNIT/ML KwikPen Inject 6 Units into the skin daily with breakfast AND 7 Units 2 (two) times daily before lunch and supper.   Insulin Pen Needle (BD PEN NEEDLE NANO 2ND GEN) 32G X 4 MM MISC Four times daily   Methoxy PEG-Epoetin Beta (MIRCERA IJ) Mircera   metoprolol tartrate (LOPRESSOR) 25 MG tablet Take 1 tablet (25 mg total) by mouth 2 (two) times daily.   MICROLET LANCETS MISC USE FOUR TIMES A DAY AS DIRECTED.   nystatin (  MYCOSTATIN/NYSTOP) powder Apply topically 2 (two) times daily. To groin and scrotum (Patient taking differently: Apply 1 g topically 2 (two) times daily as needed (rash). To groin and scrotum)   ONETOUCH ULTRA test strip USE TO TEST 4 TIMES DAILY.   sucroferric oxyhydroxide (VELPHORO) 500 MG chewable tablet Chew 500 mg by mouth 3 (three) times daily with meals.   TRESIBA FLEXTOUCH 100 UNIT/ML FlexTouch Pen Inject 19 Units into the skin at bedtime.   No current facility-administered medications for this visit. (Other)      REVIEW OF SYSTEMS: ROS    Positive for: Genitourinary, Endocrine, Eyes   Negative for: Constitutional,  Gastrointestinal, Neurological, Skin, Musculoskeletal, HENT, Cardiovascular, Respiratory, Psychiatric, Allergic/Imm, Heme/Lymph   Last edited by Theodore Demark, COA on 07/04/2020 10:23 AM. (History)       ALLERGIES Allergies  Allergen Reactions   Tramadol Nausea And Vomiting    PAST MEDICAL HISTORY Past Medical History:  Diagnosis Date   Anemia    Diabetes mellitus    Type 2    Diabetic retinopathy of both eyes ([place]) 01/31/2019   Dr Coralyn Pear 01/2019   ESRD (end stage renal disease) ([place])    [place] Frenisus   Herpes simplex virus (HSV) infection    OU   Hypertension    Retinal detachment    OS   Sepsis due to Streptococcus, group B ([place]) 10/05/2018   Past Surgical History:  Procedure Laterality Date   25 GAUGE PARS PLANA VITRECTOMY WITH 20 GAUGE MVR PORT FOR MACULAR HOLE Right 02/24/2019   Procedure: 25 GAUGE PARS PLANA VITRECTOMY WITH 20 GAUGE MVR PORT FOR MACULAR HOLE;  Surgeon: Bernarda Caffey, MD;  Location: [facility];  Service: Ophthalmology;  Laterality: Right;   APPLICATION OF WOUND VAC Left 08/27/2018   Procedure: APPLICATION OF WOUND VAC, left neck;  Surgeon: Gaye Pollack, MD;  Location: [facility];  Service: Thoracic;  Laterality: Left;   APPLICATION OF WOUND VAC Left 08/30/2018   Procedure: APPLICATION OF WOUND VAC;  Surgeon: Gaye Pollack, MD;  Location: [facility];  Service: Thoracic;  Laterality: Left;   AV FISTULA PLACEMENT Left 02/15/2020   Procedure: LEFT ARM ARTERIOVENOUS (AV) FISTULA CREATION;  Surgeon: Waynetta Sandy, MD;  Location: [facility];  Service: Vascular;  Laterality: Left;   CATARACT EXTRACTION Bilateral    CATARACT EXTRACTION W/ INTRAOCULAR LENS  IMPLANT, BILATERAL     COLONOSCOPY  06/24/2011   Procedure: COLONOSCOPY;  Surgeon: [name], MD;  [facility];  Service: Endoscopy;  Laterality: N/A;  8:30 AM   EYE SURGERY     GAS/FLUID EXCHANGE Left 03/31/2019   Procedure: Gas/Fluid Exchange;  Surgeon: Bernarda Caffey, MD;   Location: [facility];  Service: Ophthalmology;  Laterality: Left;   INJECTION OF SILICONE OIL  8/85/0277   Procedure: Injection Of Silicone Oil;  Surgeon: Bernarda Caffey, MD;  Location: [facility];  Service: Ophthalmology;;   INSERTION OF DIALYSIS CATHETER Right 12/05/2019   Procedure: INSERTION OF DIALYSIS CATHETER RIGHT SUBCLAVIAN;  Surgeon: Virl Cagey, MD;  [facility];  Service: General;  Laterality: Right;   INSERTION OF DIALYSIS CATHETER Right 12/07/2019   Procedure: Minor Dialysis catheter in place- need to reposition/ adjust catheter to help with flows;  Surgeon: Virl Cagey, MD;  Location: [facility];  Service: General;  Laterality: Right;  procedure room case with 1% lidocaine, full sterile drape,  towels, full gown, large chloraprep, 4-0 Monocryl and dermabond    INSERTION OF DIALYSIS CATHETER Right  12/08/2019   Procedure: INSERTION OF DIALYSIS CATHETER EXCHANGE;  Surgeon: Virl Cagey, MD;  [facility];  Service: General;  Laterality: Right;   MEMBRANE PEEL Right 02/24/2019   Procedure: Antoine Primas;  Surgeon: Bernarda Caffey, MD;  Location: [facility];  Service: Ophthalmology;  Laterality: Right;   PARS PLANA VITRECTOMY Left 03/31/2019   Procedure: PARS PLANA VITRECTOMY WITH 25 GAUGE WITH MEMBRANE PEEL;  Surgeon: Bernarda Caffey, MD;  Location: [facility];  Service: Ophthalmology;  Laterality: Left;   PHOTOCOAGULATION WITH LASER Right 02/24/2019   Procedure: Photocoagulation With Laser;  Surgeon: Bernarda Caffey, MD;  Location: [place];  Service: Ophthalmology;  Laterality: Right;   PHOTOCOAGULATION WITH LASER Left 03/31/2019   Procedure: Photocoagulation With Laser;  Surgeon: Bernarda Caffey, MD;  Location: [facility];  Service: Ophthalmology;  Laterality: Left;   RETINAL DETACHMENT SURGERY Left 03/31/2019   TRD Repair - Dr. Bernarda Caffey   SILICON OIL REMOVAL Right 3/73/4287   Procedure: Silicon Oil Removal;  Surgeon: Bernarda Caffey, MD;  Location: [facility];  Service: Ophthalmology;   Laterality: Right;   STERNAL WOUND DEBRIDEMENT Left 08/27/2018   Procedure: Incision and DEBRIDEMENT Left Chest, Neck and Mediastinum;  Surgeon: Gaye Pollack, MD;  [facility];  Service: Thoracic;  Laterality: Left;   STERNAL WOUND DEBRIDEMENT Left 08/30/2018   Procedure: WOUND VAC CHANGE, LEFT CHEST AND NECK, POSSIBLE DEBRIDEMENT;  Surgeon: Gaye Pollack, MD;  Location[facility];  Service: Thoracic;  Laterality: Left;    FAMILY HISTORY Family History  Problem Relation Age of Onset   Hypertension Mother    Colon cancer Neg Hx     SOCIAL HISTORY Social History   Tobacco Use   Smoking status: Never Smoker   Smokeless tobacco: Never Used  Vaping Use   Vaping Use: Never used  Substance Use Topics   Alcohol use: No   Drug use: No         OPHTHALMIC EXAM:  Base Eye Exam    Visual Acuity (Snellen - Linear)      Right Left   Dist [place] 20/350 +1 20/250 +1   Dist ph [place] 20/200 20/150 -1       Tonometry (Tonopen, 10:21 AM)      Right Left   Pressure 19 18       Pupils      Dark Light Shape React APD   Right 4 3 Irregular Minimal None   Left 3 2 Irregular Minimal None       Visual Fields (Counting fingers)      Left Right   Restrictions Partial outer superior temporal, inferior temporal, superior nasal, inferior nasal deficiencies Partial outer superior temporal, inferior temporal, superior nasal, inferior nasal deficiencies       Extraocular Movement      Right Left    Full Full       Neuro/Psych    Oriented x3: Yes   Mood/Affect: Normal       Dilation    Both eyes: 1.0% Mydriacyl, 2.5% Phenylephrine @ 10:20 AM        Slit Lamp and Fundus Exam    Slit Lamp Exam      Right Left   Lids/Lashes mild Meibomian gland dysfunction mild Meibomian gland dysfunction   Conjunctiva/Sclera subconj silicon oil bubbles, Chemosis, conj Cyst White and quiet   Cornea 2-3+ inferior PEE, Well healed cataract wound Clear, Well healed cataract wound, 2+ Punctate  epithelial erosions   Anterior Chamber Deep and quiet, no  cell or flare Deep and quiet, no cell or flare   Iris slightly irregular dilation, posterior synchiae from 3:00-0600, 0800-0900 and 1100-1200 round, focal Posterior synechiae at 1030   Lens PC IOL in good position, pigment deposition, 1+ PCO, mild pigment deposition on optic PC IOL in good position, mild pigment deposition   Vitreous Post vit, silicon oil bubble ~27% post vitrectomy, good silicone oil fill       Fundus Exam      Right Left   Disc 2+pallor, sharp rim, mild fibrosis along ST rim +pallor, sharp rim, fine NVD, mild fibrosis   C/D Ratio 0.2 0.2   Macula Flat under oil, scattered MA/IRH, nasal thickening / edema Flat under oil, +fibrosis/edema, scattered IRH, focal bands of PRF with traction emanating from ST macula   Vessels attenuated, Tortuous Attenuated, dilated venules, Tortuous, early NVE IT arcades   Periphery Attached, dense 360 PRP laser, mild scattered fibrosis - persistent Attached, good 360 PRP laser changes, pre-retinal fibrosis extending to posterior PRP border superior and inferiorly          IMAGING AND PROCEDURES  Imaging and Procedures for @TODAY @  OCT, Retina - OU - Both Eyes       Right Eye Quality was good. Central Foveal Thickness: 472. Progression has worsened. Findings include preretinal fibrosis, abnormal foveal contour, outer retinal atrophy, epiretinal membrane, intraretinal fluid, no SRF (interval increase in central edema).   Left Eye Quality was good. Central Foveal Thickness: 450. Progression has worsened. Findings include preretinal fibrosis, intraretinal fluid, abnormal foveal contour, intraretinal hyper-reflective material, epiretinal membrane, no SRF (Interval increase in central cystic changes and retinal edema).   Notes *Images captured and stored on drive  Diagnosis / Impression:  OD: Macula reattached under oil, interval increase in central edema OS: Interval increase in  central cystic changes and retinal edema   Clinical management:  See below  Abbreviations: NFP - Normal foveal profile. CME - cystoid macular edema. PED - pigment epithelial detachment. IRF - intraretinal fluid. SRF - subretinal fluid. EZ - ellipsoid zone. ERM - epiretinal membrane. ORA - outer retinal atrophy. ORT - outer retinal tubulation. SRHM - subretinal hyper-reflective material        Color Fundus Photography Optos - OU - Both Eyes       Right Eye Progression has been stable. Disc findings include pallor (Interval improvement in fibrosis and NV). Macula : (Attached under oil, pigmented laser scarring ST). Vessels : tortuous vessels, attenuated. Periphery : hemorrhage (Good 360 PRP).   Left Eye Progression has worsened. Disc findings include pallor, neovascularization. Macula : epiretinal membrane, edema (Interval progression of retinal fibrosis temporal and inferior macula, +NVE superior macula). Vessels : attenuated, Neovascularization, tortuous vessels (Interval dilation of venules). Periphery : RPE abnormality (360 PRP, macular fibrosis extending to posterior periphery).   Notes **Images stored on drive**  Impression: OD: retina stably attached under oil with good 360 PRP laser OS: interval worsening of fibrosis temporal and inferior macula       Intravitreal Injection, Pharmacologic Agent - OD - Right Eye       Time Out 07/04/2020. 11:45 AM. Confirmed correct patient, procedure, site, and patient consented.   Anesthesia Topical anesthesia was used.   Procedure A supplied (32 g) needle was used.   Injection:  1.25 mg Bevacizumab (AVASTIN) SOLN   NDC: 06237-628-31, Lot: [id number] , Expiration date: 08/07/2020   Route: Intravitreal, Site: Right Eye, Waste: 0 mL  Post-op Post injection exam found visual acuity of at least  counting fingers. The patient tolerated the procedure well. There were no complications. The patient received written and verbal post  procedure care education.        Intravitreal Injection, Pharmacologic Agent - OS - Left Eye       Time Out 07/04/2020. 11:49 AM. Confirmed correct patient, procedure, site, and patient consented.   Anesthesia Topical anesthesia was used. Anesthetic medications included Lidocaine 2%, Proparacaine 0.5%.   Procedure Preparation included 5% betadine to ocular surface, eyelid speculum. A (32 g) needle was used.   Injection:  1.25 mg Bevacizumab (AVASTIN) SOLN   NDC: 70360-001-02, Lot: [id number], Expiration date: 07/27/2020   Route: Intravitreal, Site: Left Eye, Waste: 0.05 mL  Post-op Post injection exam found visual acuity of at least counting fingers. The patient tolerated the procedure well. There were no complications. The patient received written and verbal post procedure care education.                 ASSESSMENT/PLAN:    ICD-10-CM   1. Proliferative diabetic retinopathy of both eyes with macular edema associated with type 2 diabetes mellitus (HCC)  Y65.9935 Color Fundus Photography Optos - OU - Both Eyes    Intravitreal Injection, Pharmacologic Agent - OD - Right Eye    Intravitreal Injection, Pharmacologic Agent - OS - Left Eye    Bevacizumab (AVASTIN) SOLN 1.25 mg    Bevacizumab (AVASTIN) SOLN 1.25 mg  2. 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 Vitreous hemorrhage of both eyes ([place])  H43.13   5. Retinal edema  H35.81 OCT, Retina - OU - Both Eyes  6. Essential hypertension  I10   7. Hypertensive retinopathy of both eyes  H35.033   8. Pseudophakia of both eyes  Z96.1   9. CME (cystoid macular edema), left  H35.352     1-5. Proliferative diabetic retinopathy with DME, TRD, and vitreous hemorrhage OU (OD > OS)  - lost to f/u from Jan 2021 to Oct 2021 due to loss of insurance coverage  - pt with complex medical history with hospitalization in Jan-Feb 2020 for bacteremia and abscess  - history of poor  glycemic control for years  - s/p IVA OD #1 (06.20.20), #2 (07.01.20), #3 (09.11.20), #4 (10.09.20), #5 (11.06.20)  - s/p IVA OS #1 (06.20.20), #2 (07.01.20), #3 (08.13.20), #4 (09.11.20)  #5 (11.06.20)  - s/p IVE OU #1 (12.04.20) - sample; #2 (01.06.21)  - s/p STK OS #1 (10.09.20)  - s/p PRP OS (06.10.20)  - exam showed diffuse vitreous and preretinal hemorrhage + scattered fibrosis OU   - pre-op clearing of VH OD improved visualization of posterior pole --+macular TRD OD; and ?regressing NVD OS  - pre-op OCT shows preretinal fibrosis w/ traction OU (OD > OS)  - s/p PPV/PFC/EL/FAX/silicone oil OD, 70.17.79  - s/p subconj silicon oil removal OD 8.20.20  - s/p PPV/EL/FAX/silicone oil OS, 39.03.00  - BCVA 20/200 OD; 20/150             - OD: improved fibrosis and edema, retina attached and in good position under oil, +macular edema, slightly increased; residual pockets of inferior SRF improved  - OS: interval progression of fibrosis temporal macula -- new fibrotic bands under oil; interval increase in macular edema  - recommend IVA OU #6 for DME  - RBA of procedure discussed, questions answered  - informed consent obtained and signed  - see procedure note  - discussed possible need  for PPV w/ membrane peel and silicon oil exchange OS due to worsening fibrosis  - f/u 4 weeks -- DFE/OCT, possible injection  6,7. Hypertensive retinopathy OU  - discussed importance of tight BP control  - monitor  8. Pseudophakia OU  - s/p CE/IOL OU (Dr. Gershon Crane)  Ophthalmic Meds Ordered this visit:  Meds ordered this encounter  Medications   Bevacizumab (AVASTIN) SOLN 1.25 mg   Bevacizumab (AVASTIN) SOLN 1.25 mg      Return in about 4 weeks (around 08/01/2020) for f/u PDR OU, DFE, OCT.  There are no Patient Instructions on file for this visit.   This document serves as a record of services personally performed by Gardiner Sleeper, MD, PhD. It was created on their behalf by Leeann Must, [place], an  ophthalmic technician. The creation of this record is the provider's dictation and/or activities during the visit.    Electronically signed by: Leeann Must, COA @TODAY @ 5:54 PM  Gardiner Sleeper, M.D., Ph.D. Diseases & Surgery of the Retina and [place] 07/04/2020   I have reviewed the above documentation for accuracy and completeness, and I agree with the above. Gardiner Sleeper, M.D., Ph.D. 07/04/20 5:54 PM   Abbreviations: M myopia (nearsighted); A astigmatism; H hyperopia (farsighted); P presbyopia; Mrx spectacle prescription;  CTL contact lenses; OD right eye; OS left eye; OU both eyes  XT exotropia; ET esotropia; PEK punctate epithelial keratitis; PEE punctate epithelial erosions; DES dry eye syndrome; MGD meibomian gland dysfunction; ATs artificial tears; PFAT's preservative free artificial tears; [place] nuclear sclerotic cataract; PSC posterior subcapsular cataract; ERM epi-retinal membrane; PVD posterior vitreous detachment; RD retinal detachment; DM diabetes mellitus; DR diabetic retinopathy; NPDR non-proliferative diabetic retinopathy; PDR proliferative diabetic retinopathy; CSME clinically significant macular edema; DME diabetic macular edema; dbh dot blot hemorrhages; CWS cotton wool spot; POAG primary open angle glaucoma; C/D cup-to-disc ratio; HVF humphrey visual field; GVF goldmann visual field; OCT optical coherence tomography; IOP intraocular pressure; BRVO Branch retinal vein occlusion; CRVO central retinal vein occlusion; CRAO central retinal artery occlusion; BRAO branch retinal artery occlusion; RT retinal tear; SB scleral buckle; PPV pars plana vitrectomy; VH Vitreous hemorrhage; PRP panretinal laser photocoagulation; IVK intravitreal kenalog; VMT vitreomacular traction; MH Macular hole;  NVD neovascularization of the disc; NVE neovascularization elsewhere; AREDS age related eye disease study; ARMD age related macular degeneration; POAG primary  open angle glaucoma; EBMD epithelial/anterior basement membrane dystrophy; ACIOL anterior chamber intraocular lens; IOL intraocular lens; PCIOL posterior chamber intraocular lens; Phaco/IOL phacoemulsification with intraocular lens placement; [place] photorefractive keratectomy; LASIK laser assisted in situ keratomileusis; HTN hypertension; DM diabetes mellitus; COPD chronic obstructive pulmonary disease

## 2020-07-02 DIAGNOSIS — Z992 Dependence on renal dialysis: Secondary | ICD-10-CM | POA: Insufficient documentation

## 2020-07-02 DIAGNOSIS — H538 Other visual disturbances: Secondary | ICD-10-CM | POA: Insufficient documentation

## 2020-07-02 DIAGNOSIS — H3323 Serous retinal detachment, bilateral: Secondary | ICD-10-CM | POA: Insufficient documentation

## 2020-07-02 DIAGNOSIS — K219 Gastro-esophageal reflux disease without esophagitis: Secondary | ICD-10-CM | POA: Insufficient documentation

## 2020-07-02 DIAGNOSIS — Z8739 Personal history of other diseases of the musculoskeletal system and connective tissue: Secondary | ICD-10-CM | POA: Insufficient documentation

## 2020-07-02 DIAGNOSIS — H269 Unspecified cataract: Secondary | ICD-10-CM | POA: Insufficient documentation

## 2020-07-02 DIAGNOSIS — Z8669 Personal history of other diseases of the nervous system and sense organs: Secondary | ICD-10-CM | POA: Insufficient documentation

## 2020-07-02 DIAGNOSIS — E785 Hyperlipidemia, unspecified: Secondary | ICD-10-CM | POA: Insufficient documentation

## 2020-07-02 HISTORY — DX: Unspecified cataract: H26.9

## 2020-07-03 ENCOUNTER — Encounter: Payer: Self-pay | Admitting: Podiatry

## 2020-07-03 NOTE — Progress Notes (Signed)
Subjective:  Patient ID: Randy Oneal, male    DOB: [birth year],  MRN: [id number]  Chief Complaint  Patient presents with  . Callouses    Pt stated tha he has no concerns and has the ocasional pain     54 y.o. male presents for wound care.  Patient presents with a follow-up of history of right hallux IPJ ulceration that has now clinically healed.  Patient has a very hyperkeratotic lesion that needs to be debrided down.  He states is painful to walk on.  He would like to schedule to get orthotics picked up as well.   Review of Systems: Negative except as noted in the HPI. Denies N/V/F/Ch.  Past Medical History:  Diagnosis Date  . Anemia   . Diabetes mellitus    Type 2   . Diabetic retinopathy of both eyes ([place]) 01/31/2019   Dr Coralyn Pear 01/2019  . ESRD (end stage renal disease) ([place])    [place] Frenisus  . Herpes simplex virus (HSV) infection    OU  . Hypertension   . Retinal detachment    OS  . Sepsis due to Streptococcus, group B ([place]) 10/05/2018    Current Outpatient Medications:  .  acetaminophen (TYLENOL) 500 MG tablet, Take 1,000 mg by mouth every 6 (six) hours as needed for moderate pain or headache., Disp: , Rfl:  .  amLODipine (NORVASC) 2.5 MG tablet, Take 1 tablet (2.5 mg total) by mouth daily., Disp: 30 tablet, Rfl: 3 .  atorvastatin (LIPITOR) 10 MG tablet, Take 1 tablet (10 mg total) by mouth daily., Disp: 30 tablet, Rfl: 5 .  brimonidine (ALPHAGAN) 0.2 % ophthalmic solution, Place 1 drop into both eyes daily., Disp: 10 mL, Rfl: 10 .  Bromfenac Sodium (PROLENSA) 0.07 % SOLN, Place 1 drop into both eyes 4 (four) times daily., Disp: 3 mL, Rfl: 10 .  calcium acetate (PHOSLO) 667 MG capsule, Take 1,334 mg by mouth 3 (three) times daily with meals., Disp: , Rfl:  .  Calcium Acetate 668 (169 Ca) MG TABS, TAKE 2 TABLETS BY MOUTH THREE TIMES DAILY WITH MEALS, Disp: , Rfl:  .  cinacalcet (SENSIPAR) 30 MG tablet, Take by mouth., Disp: , Rfl:  .  Continuous Blood Gluc Sensor  ([place]) MISC, Use one sensor every 10 days., Disp: 3 each, Rfl: 2 .  Darbepoetin Alfa (ARANESP) 60 MCG/0.3ML SOSY injection, Inject 0.3 mLs (60 mcg total) into the vein every Monday with hemodialysis., Disp: , Rfl:  .  famotidine (PEPCID) 40 MG tablet, Take 0.5 tablets (20 mg total) by mouth at bedtime., Disp: 30 tablet, Rfl: 1 .  insulin lispro (HUMALOG KWIKPEN) 100 UNIT/ML KwikPen, Inject 6 Units into the skin daily with breakfast AND 7 Units 2 (two) times daily before lunch and supper., Disp: 15 mL, Rfl: 0 .  Insulin Pen Needle (BD PEN NEEDLE NANO 2ND GEN) 32G X 4 MM MISC, Four times daily, Disp: 150 each, Rfl: 11 .  Methoxy PEG-Epoetin Beta (MIRCERA IJ), Mircera, Disp: , Rfl:  .  metoprolol tartrate (LOPRESSOR) 25 MG tablet, Take 1 tablet (25 mg total) by mouth 2 (two) times daily., Disp: 60 tablet, Rfl: 1 .  MICROLET LANCETS MISC, USE FOUR TIMES A DAY AS DIRECTED., Disp: 100 each, Rfl: 0 .  nystatin (MYCOSTATIN/NYSTOP) powder, Apply topically 2 (two) times daily. To groin and scrotum (Patient taking differently: Apply 1 g topically 2 (two) times daily as needed (rash). To groin and scrotum), Disp: 15 g, Rfl: 0 .  ONETOUCH ULTRA test strip, USE TO TEST 4 TIMES DAILY., Disp: 100 strip, Rfl: 1 .  prednisoLONE acetate (PRED FORTE) 1 % ophthalmic suspension, Place 1 drop into both eyes 4 (four) times daily., Disp: 15 mL, Rfl: 3 .  sucroferric oxyhydroxide (VELPHORO) 500 MG chewable tablet, Chew 500 mg by mouth 3 (three) times daily with meals., Disp: , Rfl:  .  TRESIBA FLEXTOUCH 100 UNIT/ML FlexTouch Pen, Inject 19 Units into the skin at bedtime., Disp: 15 mL, Rfl: 0  Social History   Tobacco Use  Smoking Status Never Smoker  Smokeless Tobacco Never Used    Allergies  Allergen Reactions  . Tramadol Nausea And Vomiting   Objective:  There were no vitals filed for this visit. There is no height or weight on file to calculate BMI. Constitutional Well developed. Well  nourished.  Vascular Dorsalis pedis pulses palpable bilaterally. Posterior tibial pulses palpable bilaterally. Capillary refill normal to all digits.  No cyanosis or clubbing noted. Pedal hair growth normal.  Neurologic Normal speech. Oriented to person, place, and time. Protective sensation absent  Dermatologic  right hallux IPJ completely reepithelialized without any underlying ulceration.  No clinical signs of infection noted.  Patient does have hammertoe deformity to bilateral feet with primary deformity of the hallux on the right side.  Orthopedic: No pain to palpation either foot.   Radiographs: None Assessment:   No diagnosis found. Plan:  Patient was evaluated and treated and all questions answered.  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  Given that patient is a high risk of developing wound I believe patient will benefit from diabetic shoes with insoles given that there is a hallux malleus contracture present leading to excessive pressure. -He will be scheduled to get diabetic shoes with insoles by Liliane Channel.   No follow-ups on file.

## 2020-07-04 ENCOUNTER — Other Ambulatory Visit: Payer: Self-pay

## 2020-07-04 ENCOUNTER — Encounter (INDEPENDENT_AMBULATORY_CARE_PROVIDER_SITE_OTHER): Payer: Self-pay | Admitting: Ophthalmology

## 2020-07-04 ENCOUNTER — Ambulatory Visit (INDEPENDENT_AMBULATORY_CARE_PROVIDER_SITE_OTHER): Payer: Medicare Other | Admitting: Ophthalmology

## 2020-07-04 DIAGNOSIS — Z961 Presence of intraocular lens: Secondary | ICD-10-CM

## 2020-07-04 DIAGNOSIS — H4921 Sixth [abducent] nerve palsy, right eye: Secondary | ICD-10-CM | POA: Diagnosis not present

## 2020-07-04 DIAGNOSIS — E113513 Type 2 diabetes mellitus with proliferative diabetic retinopathy with macular edema, bilateral: Secondary | ICD-10-CM | POA: Diagnosis not present

## 2020-07-04 DIAGNOSIS — H4313 Vitreous hemorrhage, bilateral: Secondary | ICD-10-CM

## 2020-07-04 DIAGNOSIS — I10 Essential (primary) hypertension: Secondary | ICD-10-CM

## 2020-07-04 DIAGNOSIS — H3581 Retinal edema: Secondary | ICD-10-CM

## 2020-07-04 DIAGNOSIS — H35033 Hypertensive retinopathy, bilateral: Secondary | ICD-10-CM

## 2020-07-04 DIAGNOSIS — H3343 Traction detachment of retina, bilateral: Secondary | ICD-10-CM | POA: Diagnosis not present

## 2020-07-04 DIAGNOSIS — H35352 Cystoid macular degeneration, left eye: Secondary | ICD-10-CM

## 2020-07-04 MED ORDER — BEVACIZUMAB CHEMO INJECTION 1.25MG/0.05ML SYRINGE FOR KALEIDOSCOPE
1.2500 mg | INTRAVITREAL | Status: AC | PRN
  Administered 2020-07-04: 1.25 mg via INTRAVITREAL

## 2020-07-09 ENCOUNTER — Other Ambulatory Visit: Payer: Self-pay

## 2020-07-09 ENCOUNTER — Ambulatory Visit: Payer: Medicare Other | Admitting: Orthotics

## 2020-07-09 DIAGNOSIS — Q666 Other congenital valgus deformities of feet: Secondary | ICD-10-CM

## 2020-07-09 DIAGNOSIS — M2041 Other hammer toe(s) (acquired), right foot: Secondary | ICD-10-CM

## 2020-07-09 DIAGNOSIS — M2031 Hallux varus (acquired), right foot: Secondary | ICD-10-CM

## 2020-07-09 NOTE — Progress Notes (Signed)

## 2020-07-11 ENCOUNTER — Ambulatory Visit (INDEPENDENT_AMBULATORY_CARE_PROVIDER_SITE_OTHER): Payer: Medicare Other | Admitting: Internal Medicine

## 2020-07-11 ENCOUNTER — Other Ambulatory Visit: Payer: Self-pay

## 2020-07-11 ENCOUNTER — Encounter: Payer: Self-pay | Admitting: Internal Medicine

## 2020-07-11 VITALS — BP 142/82 | HR 85 | Ht 71.0 in | Wt 180.4 lb

## 2020-07-11 DIAGNOSIS — Z794 Long term (current) use of insulin: Secondary | ICD-10-CM

## 2020-07-11 DIAGNOSIS — E1142 Type 2 diabetes mellitus with diabetic polyneuropathy: Secondary | ICD-10-CM | POA: Diagnosis not present

## 2020-07-11 DIAGNOSIS — N186 End stage renal disease: Secondary | ICD-10-CM

## 2020-07-11 DIAGNOSIS — E1165 Type 2 diabetes mellitus with hyperglycemia: Secondary | ICD-10-CM

## 2020-07-11 DIAGNOSIS — E11319 Type 2 diabetes mellitus with unspecified diabetic retinopathy without macular edema: Secondary | ICD-10-CM | POA: Diagnosis not present

## 2020-07-11 DIAGNOSIS — E1122 Type 2 diabetes mellitus with diabetic chronic kidney disease: Secondary | ICD-10-CM

## 2020-07-11 DIAGNOSIS — Z992 Dependence on renal dialysis: Secondary | ICD-10-CM

## 2020-07-11 LAB — POCT GLYCOSYLATED HEMOGLOBIN (HGB A1C): Hemoglobin A1C: 11 % — AB (ref 4.0–5.6)

## 2020-07-11 LAB — GLUCOSE, POCT (MANUAL RESULT ENTRY): POC Glucose: 155 mg/dl — AB (ref 70–99)

## 2020-07-11 MED ORDER — DEXCOM G6 SENSOR MISC: 1.0000 | 11 refills | Status: DC

## 2020-07-11 MED ORDER — DEXCOM G6 TRANSMITTER MISC: 1.0000 | 3 refills | Status: DC

## 2020-07-11 NOTE — Patient Instructions (Signed)
-   Increase Tresiba to 22 units at Bedtime - Increase  Humalog to 7 units with each meal     Choose healthy, lower carb lower calorie snacks: toss salad, cooked vegetables, cottage    HOW TO TREAT LOW BLOOD SUGARS (Blood sugar LESS THAN 70 MG/DL)  Please follow the RULE OF 15 for the treatment of hypoglycemia treatment (when your (blood sugars are less than 70 mg/dL)    STEP 1: Take 15 grams of carbohydrates when your blood sugar is low, which includes:   3-4 GLUCOSE TABS  OR  3-4 OZ OF JUICE OR REGULAR SODA OR  ONE TUBE OF GLUCOSE GEL     STEP 2: RECHECK blood sugar in 15 MINUTES STEP 3: If your blood sugar is still low at the 15 minute recheck --> then, go back to STEP 1 and treat AGAIN with another 15 grams of carbohydrates. Decrease Tresiba to 24 units at bedtime Decrease Humalog to 7 units 3 times daily with meals,

## 2020-07-11 NOTE — Progress Notes (Signed)
Name: Randy Oneal  Age/ Sex: 54 y.o., male   MRN/ DOB: [id number], 02/09/1966     PCP: [name], MD   Reason for Endocrinology Evaluation: Type 2 Diabetes Mellitus  Initial Endocrine Consultative Visit: 10/20/2018    PATIENT IDENTIFIER: [name] is a 54 y.o. male with a past medical history of T2DM,HTN, and ESRD  . The patient has followed with Endocrinology clinic since  for consultative assistance with management of his diabetes.     DIABETIC HISTORY:  [name] was diagnosed with DM in 2000.  He has been on Metformin, historically his control has been poor due to non-compliance but pt became motivated after his prolonged hospitalization for chest wall infection in [place], 2020.  He has been on insulin therapy for years. His hemoglobin A1c has ranged from 13.0%in 08/2018, peaking at16.8%in 2017   On his initial presentation to our clinic his A1c 13.0% and he was on Tresiba and Humalog.   SUBJECTIVE:   During the last visit (11/17/2018): This was a virtual visit , we  adjusted MDI regimen    Today (07/11/2020): [name] is here for a follow up on diabetes. He has not been to our clinic in 20 months . He checks his blood sugars 3 times daily, preprandial , he did not bring his meter today. The patient has not had hypoglycemic episodes since the last clinic visit   He is working towards getting on the transplant list and is motivated to control his diabetes.     HOME DIABETES REGIMEN:  Tyler Aas 21 units daily  Humalog 7/6/6 units     Statin: Yes ACE-I/ARB: on ESRD    METER DOWNLOAD SUMMARY: Did not bring    DIABETIC COMPLICATIONS: Microvascular complications:   Retinopathy, neuropathy, ESRD  Last Eye Exam: Completed 08/17/2019  Macrovascular complications:    Denies: CAD, CVA, PVD   HISTORY:  Past Medical History:  Past Medical History:  Diagnosis Date  . Anemia   . Diabetes mellitus    Type 2   . Diabetic retinopathy of both  eyes ([place]) 01/31/2019   Dr Coralyn Pear 01/2019  . ESRD (end stage renal disease) ([place])    [place] Frenisus  . Herpes simplex virus (HSV) infection    OU  . Hypertension   . Retinal detachment    OS  . Sepsis due to Streptococcus, group B ([place]) 10/05/2018   Past Surgical History:  Past Surgical History:  Procedure Laterality Date  . [place] VITRECTOMY WITH 20 GAUGE MVR PORT FOR MACULAR HOLE Right 02/24/2019   Procedure: 25 GAUGE PARS PLANA VITRECTOMY WITH 20 GAUGE MVR PORT FOR MACULAR HOLE;  Surgeon: Bernarda Caffey, MD;  Location: [facility];  Service: Ophthalmology;  Laterality: Right;  . APPLICATION OF WOUND VAC Left 08/27/2018   Procedure: APPLICATION OF WOUND VAC, left neck;  Surgeon: Gaye Pollack, MD;  Location: [facility];  Service: Thoracic;  Laterality: Left;  . APPLICATION OF WOUND VAC Left 08/30/2018   Procedure: APPLICATION OF WOUND VAC;  Surgeon: Gaye Pollack, MD;  Location: [facility];  Service: Thoracic;  Laterality: Left;  . AV FISTULA PLACEMENT Left 02/15/2020   Procedure: LEFT ARM ARTERIOVENOUS (AV) FISTULA CREATION;  Surgeon: Waynetta Sandy, MD;  Location: [facility];  Service: Vascular;  Laterality: Left;  . CATARACT EXTRACTION Bilateral   . CATARACT EXTRACTION W/ INTRAOCULAR LENS  IMPLANT, BILATERAL    . COLONOSCOPY  06/24/2011   Procedure: COLONOSCOPY;  Surgeon: Hope Pigeon  Fields, MD;  [facility];  Service: Endoscopy;  Laterality: N/A;  8:30 AM  . EYE SURGERY    . GAS/FLUID EXCHANGE Left 03/31/2019   Procedure: Gas/Fluid Exchange;  Surgeon: Bernarda Caffey, MD;  Location: [facility];  Service: Ophthalmology;  Laterality: Left;  . INJECTION OF SILICONE OIL  10/24/4006   Procedure: Injection Of Silicone Oil;  Surgeon: Bernarda Caffey, MD;  Location: [facility];  Service: Ophthalmology;;  . INSERTION OF DIALYSIS CATHETER Right 12/05/2019   Procedure: INSERTION OF DIALYSIS CATHETER RIGHT SUBCLAVIAN;  Surgeon: Virl Cagey, MD;  Location[facility];  Service: General;   Laterality: Right;  . INSERTION OF DIALYSIS CATHETER Right 12/07/2019   Procedure: Minor Dialysis catheter in place- need to reposition/ adjust catheter to help with flows;  Surgeon: Virl Cagey, MD;  Location[facility];  Service: General;  Laterality: Right;  procedure room case with 1% lidocaine, full sterile drape,  towels, full gown, large chloraprep, 4-0 Monocryl and dermabond   . INSERTION OF DIALYSIS CATHETER Right 12/08/2019   Procedure: INSERTION OF DIALYSIS CATHETER EXCHANGE;  Surgeon: Virl Cagey, MD;  [facility];  Service: General;  Laterality: Right;  . MEMBRANE PEEL Right 02/24/2019   Procedure: Antoine Primas;  Surgeon: Bernarda Caffey, MD;  Location: [facility];  Service: Ophthalmology;  Laterality: Right;  . PARS PLANA VITRECTOMY Left 03/31/2019   Procedure: PARS PLANA VITRECTOMY WITH 25 GAUGE WITH MEMBRANE PEEL;  Surgeon: Bernarda Caffey, MD;  Location: [facility];  Service: Ophthalmology;  Laterality: Left;  . PHOTOCOAGULATION WITH LASER Right 02/24/2019   Procedure: Photocoagulation With Laser;  Surgeon: Bernarda Caffey, MD;  Location: [facility];  Service: Ophthalmology;  Laterality: Right;  . PHOTOCOAGULATION WITH LASER Left 03/31/2019   Procedure: Photocoagulation With Laser;  Surgeon: Bernarda Caffey, MD;  Location: [facility];  Service: Ophthalmology;  Laterality: Left;  . RETINAL DETACHMENT SURGERY Left 03/31/2019   TRD Repair - Dr. Bernarda Caffey  . SILICON OIL REMOVAL Right 6/76/1950   Procedure: Silicon Oil Removal;  Surgeon: Bernarda Caffey, MD;  Location: [facility];  Service: Ophthalmology;  Laterality: Right;  . STERNAL WOUND DEBRIDEMENT Left 08/27/2018   Procedure: Incision and DEBRIDEMENT Left Chest, Neck and Mediastinum;  Surgeon: Gaye Pollack, MD;  Location[facility];  Service: Thoracic;  Laterality: Left;  . STERNAL WOUND DEBRIDEMENT Left 08/30/2018   Procedure: WOUND VAC CHANGE, LEFT CHEST AND NECK, POSSIBLE DEBRIDEMENT;  Surgeon: Gaye Pollack, MD;  Location: [facility];  Service:  Thoracic;  Laterality: Left;    Social History:  reports that he has never smoked. He has never used smokeless tobacco. He reports that he does not drink alcohol and does not use drugs. Family History:  Family History  Problem Relation Age of Onset  . Hypertension Mother   . Colon cancer Neg Hx      HOME MEDICATIONS: Allergies as of 07/11/2020      Reactions   Tramadol Nausea And Vomiting      Medication List       Accurate as of July 11, 2020  4:44 PM. If you have any questions, ask your nurse or doctor.        acetaminophen 500 MG tablet Commonly known as: TYLENOL Take 1,000 mg by mouth every 6 (six) hours as needed for moderate pain or headache.   amLODipine 2.5 MG tablet Commonly known as: NORVASC Take 1 tablet (2.5 mg total) by mouth daily.   atorvastatin 10 MG tablet Commonly known as: LIPITOR  Take 1 tablet (10 mg total) by mouth daily.   brimonidine 0.2 % ophthalmic solution Commonly known as: ALPHAGAN Place 1 drop into both eyes daily.   Calcium Acetate 668 (169 Ca) MG Tabs TAKE 2 TABLETS BY MOUTH THREE TIMES DAILY WITH MEALS   cinacalcet 30 MG tablet Commonly known as: SENSIPAR Take by mouth.   Darbepoetin Alfa 60 MCG/0.3ML Sosy injection Commonly known as: ARANESP Inject 0.3 mLs (60 mcg total) into the vein every Monday with hemodialysis.   Dexcom G6 Sensor Misc 1 Device by Does not apply route as directed. What changed:   how much to take  how to take this  when to take this  additional instructions Changed by: Dorita Sciara, MD   Dexcom G6 Transmitter Misc 1 Device by Does not apply route as directed. Started by: Dorita Sciara, MD   famotidine 40 MG tablet Commonly known as: PEPCID Take 0.5 tablets (20 mg total) by mouth at bedtime.   insulin lispro 100 UNIT/ML KwikPen Commonly known as: HumaLOG KwikPen Inject 6 Units into the skin daily with breakfast AND 7 Units 2 (two) times daily before lunch and supper.    Insulin Pen Needle 32G X 4 MM Misc Commonly known as: BD Pen Needle Nano 2nd Gen Four times daily   metoprolol tartrate 25 MG tablet Commonly known as: LOPRESSOR Take 1 tablet (25 mg total) by mouth 2 (two) times daily.   Microlet Lancets Misc USE FOUR TIMES A DAY AS DIRECTED.   MIRCERA IJ Mircera   nystatin powder Commonly known as: MYCOSTATIN/NYSTOP Apply topically 2 (two) times daily. To groin and scrotum What changed:   how much to take  when to take this  reasons to take this   OneTouch Ultra test strip Generic drug: glucose blood USE TO TEST 4 TIMES DAILY.   PhosLo 667 MG capsule Generic drug: calcium acetate Take 1,334 mg by mouth 3 (three) times daily with meals.   prednisoLONE acetate 1 % ophthalmic suspension Commonly known as: PRED FORTE Place 1 drop into both eyes 4 (four) times daily.   Prolensa 0.07 % Soln Generic drug: Bromfenac Sodium Place 1 drop into both eyes 4 (four) times daily.   [name] 100 UNIT/ML FlexTouch Pen Generic drug: insulin degludec Inject 19 Units into the skin at bedtime.   Velphoro 500 MG chewable tablet Generic drug: sucroferric oxyhydroxide Chew 500 mg by mouth 3 (three) times daily with meals.        OBJECTIVE:   Vital Signs: BP (!) 142/82   Pulse 85   Ht 5\' 11"  (1.803 m)   Wt 180 lb 6 oz (81.8 kg)   SpO2 97%   BMI 25.16 kg/m   Wt Readings from Last 3 Encounters:  07/11/20 180 lb 6 oz (81.8 kg)  03/30/20 176 lb (79.8 kg)  03/21/20 177 lb (80.3 kg)     Exam: General: Pt appears well and is in NAD  Lungs: Clear with good BS bilat with no rales, rhonchi, or wheezes  Heart: RRR with normal S1 and S2 and no gallops; no murmurs; no rub  Abdomen: Normoactive bowel sounds, soft, nontender, without masses or organomegaly palpable  Extremities: No pretibial edema.   Neuro: MS is good with appropriate affect, pt is alert and Ox3    DM foot exam:   07/11/2020  The skin of the feet is without sores or  ulcerations, but pt with callous formation at the medial aspect of the right great toe  The pedal pulses are 2+ on right and 2+ on left. The sensation is decreased to a screening 5.07, 10 gram monofilament bilaterally        DATA REVIEWED:  Lab Results  Component Value Date   HGBA1C 11.0 (A) 07/11/2020   HGBA1C 8.8 (H) 11/30/2019   HGBA1C 7.7 (H) 02/15/2019   Lab Results  Component Value Date   MICROALBUR 3,744.2 (H) 11/30/2019   LDLCALC 67 11/30/2019   CREATININE 6.90 (H) 02/15/2020   Lab Results  Component Value Date   MICRALBCREAT 4,825 (H) 11/30/2019     Lab Results  Component Value Date   CHOL 138 11/30/2019   HDL 63 11/30/2019   LDLCALC 67 11/30/2019   TRIG 42 11/30/2019   CHOLHDL 2.2 11/30/2019         ASSESSMENT / PLAN / RECOMMENDATIONS:   1) Type 2 Diabetes Mellitus, Poorly controlled, With CKD Oneal and neuropathic complications - Most recent A1c of 11.0 %. Goal A1c < 7.0 %.    - Pt has not been to our office in 20 months, we have reviewed our refill policy.  - His Y6M is down from 15.0% per pt  - His post-prandial BG is 155 mg/dL which is optimal. As long as he takes his insulin it will work.  - Encouraged compliance, pt is motivated because there's a discussion about renal transplant.  - I will adjust insulin  dose as below  - A prescription for the dexcom was sent to [facility], he will call for a referral to our RD for education     MEDICATIONS: - Increase Tresiba to 22 units at Bedtime - Increase  Humalog to 7 units with each meal     EDUCATION / INSTRUCTIONS:  BG monitoring instructions: Patient is instructed to check his blood sugars 3 times a day, before meals.  Call [facility] if: BG persistently < 70 . I reviewed the Rule of 15 for the treatment of hypoglycemia in detail with the patient. Literature supplied.    2) Diabetic complications:   Eye: Does  have known diabetic retinopathy.   Neuro/ Feet: Does  have  known diabetic peripheral neuropathy .   Renal: Patient does ave known baseline CKD. He   is not on an ACEI/ARB at present.     F/U in 3 months   Signed electronically by: Mack Guise, MD  [facility]  [facility] [address] [place], [place] [address] Phone: [phone] FAX: [phone]   CC: [name], [place] [address] Phone: [phone]  Fax: [phone]  Return to Endocrinology clinic as below: Future Appointments  Date Time Provider [place]  08/01/2020  9:30 AM Bernarda Caffey, MD [facility] None  08/15/2020  4:15 PM Felipa Furnace, DPM [facility]  10/10/2020  2:20 PM Anella Nakata, Melanie Crazier, MD [facility] None

## 2020-07-12 DIAGNOSIS — Z794 Long term (current) use of insulin: Secondary | ICD-10-CM | POA: Insufficient documentation

## 2020-07-12 DIAGNOSIS — E1122 Type 2 diabetes mellitus with diabetic chronic kidney disease: Secondary | ICD-10-CM | POA: Insufficient documentation

## 2020-07-12 DIAGNOSIS — E1142 Type 2 diabetes mellitus with diabetic polyneuropathy: Secondary | ICD-10-CM | POA: Insufficient documentation

## 2020-07-12 DIAGNOSIS — E11319 Type 2 diabetes mellitus with unspecified diabetic retinopathy without macular edema: Secondary | ICD-10-CM | POA: Insufficient documentation

## 2020-07-12 DIAGNOSIS — E1140 Type 2 diabetes mellitus with diabetic neuropathy, unspecified: Secondary | ICD-10-CM | POA: Insufficient documentation

## 2020-07-12 MED ORDER — INSULIN PEN NEEDLE 32G X 4 MM MISC: 1.0000 | Freq: Four times a day (QID) | 1 refills | Status: DC

## 2020-07-12 MED ORDER — INSULIN LISPRO (1 UNIT DIAL) 100 UNIT/ML (KWIKPEN): 7.0000 [IU] | PEN_INJECTOR | Freq: Every day | SUBCUTANEOUS | 1 refills | Status: DC

## 2020-07-12 MED ORDER — TRESIBA FLEXTOUCH 100 UNIT/ML ~~LOC~~ SOPN: 22.0000 [IU] | PEN_INJECTOR | Freq: Every day | SUBCUTANEOUS | 1 refills | Status: DC

## 2020-07-12 MED ORDER — INSULIN LISPRO (1 UNIT DIAL) 100 UNIT/ML (KWIKPEN)
7.0000 [IU] | PEN_INJECTOR | Freq: Every day | SUBCUTANEOUS | 1 refills | Status: DC
Start: 2020-07-12 — End: 2020-07-12

## 2020-07-31 NOTE — Progress Notes (Signed)
[facility] Note  08/01/2020     CHIEF COMPLAINT Patient presents for Retina Follow Up   HISTORY OF PRESENT ILLNESS: Randy Oneal is a 54 y.o. male who presents to the clinic today for:  HPI    Retina Follow Up    Patient presents with  Diabetic Retinopathy.  In both eyes.  This started 4 weeks ago.  I, the attending physician,  performed the HPI with the patient and updated documentation appropriately.          Comments    Patient here for 4 weeks retina follow up for PDR OU. Patient states vision about the same. OS kinda around the eye brow area has discomfort-pain. Some days painful other days not. 2 - 3 straight days not then couple days has pain. OD no pain or problems. OS is watery drainage on occasion.       Last edited by Bernarda Caffey, MD on 08/01/2020  1:07 PM. (History)    Pt states his right eye has been sore off and on since the last injection last month, she states his blood sugar is coming down, he has gotten new medication and is taking it regularly, he is still on dialysis, he states he is usually drained after the procedure, he states yesterday after dialysis he was sick to his stomach, he states they have been to [facility] about a kidney transplant, but they are waiting for his A1c to come down  Referring physician: [name], MD [address] [place],  [place] [address]  HISTORICAL INFORMATION:   Selected notes from the MEDICAL RECORD NUMBER Referred by Dr.Mark Gershon Crane for concern of decreased vision post cataract sx LEE:  Ocular Hx- PMH-   CURRENT MEDICATIONS: Current Outpatient Medications (Ophthalmic Drugs)  Medication Sig  . brimonidine (ALPHAGAN) 0.2 % ophthalmic solution Place 1 drop into both eyes daily.  . Bromfenac Sodium (PROLENSA) 0.07 % SOLN Place 1 drop into both eyes 4 (four) times daily.  . prednisoLONE acetate (PRED FORTE) 1 % ophthalmic suspension Place 1 drop into both eyes 4 (four) times daily.    No current facility-administered medications for this visit. (Ophthalmic Drugs)   Current Outpatient Medications (Other)  Medication Sig  . acetaminophen (TYLENOL) 500 MG tablet Take 1,000 mg by mouth every 6 (six) hours as needed for moderate pain or headache.  Marland Kitchen amLODipine (NORVASC) 2.5 MG tablet Take 1 tablet (2.5 mg total) by mouth daily.  Marland Kitchen atorvastatin (LIPITOR) 10 MG tablet Take 1 tablet (10 mg total) by mouth daily.  . calcium acetate (PHOSLO) 667 MG capsule Take 1,334 mg by mouth 3 (three) times daily with meals.  . Calcium Acetate 668 (169 Ca) MG TABS TAKE 2 TABLETS BY MOUTH THREE TIMES DAILY WITH MEALS  . cinacalcet (SENSIPAR) 30 MG tablet Take by mouth.  . Continuous Blood Gluc Sensor (DEXCOM G6 SENSOR) MISC 1 Device by Does not apply route as directed.  . Continuous Blood Gluc Transmit (DEXCOM G6 TRANSMITTER) MISC 1 Device by Does not apply route as directed.  . Darbepoetin Alfa (ARANESP) 60 MCG/0.3ML SOSY injection Inject 0.3 mLs (60 mcg total) into the vein every Monday with hemodialysis.  . famotidine (PEPCID) 40 MG tablet Take 0.5 tablets (20 mg total) by mouth at bedtime.  . insulin lispro (HUMALOG KWIKPEN) 100 UNIT/ML KwikPen Inject 7 Units into the skin daily with breakfast.  . Insulin Pen Needle 32G X 4 MM MISC 1 Device by Does not  apply route in the morning, at noon, in the evening, and at bedtime.  . Methoxy PEG-Epoetin Beta (MIRCERA IJ) Mircera  . metoprolol tartrate (LOPRESSOR) 25 MG tablet Take 1 tablet (25 mg total) by mouth 2 (two) times daily.  Marland Kitchen MICROLET LANCETS MISC USE FOUR TIMES A DAY AS DIRECTED.  Marland Kitchen nystatin (MYCOSTATIN/NYSTOP) powder Apply topically 2 (two) times daily. To groin and scrotum (Patient taking differently: Apply 1 g topically 2 (two) times daily as needed (rash). To groin and scrotum)  . ONETOUCH ULTRA test strip USE TO TEST 4 TIMES DAILY.  . sucroferric oxyhydroxide (VELPHORO) 500 MG chewable tablet Chew 500 mg by mouth 3 (three) times daily  with meals.  [name] 100 UNIT/ML FlexTouch Pen Inject 22 Units into the skin at bedtime.   No current facility-administered medications for this visit. (Other)      REVIEW OF SYSTEMS: ROS    Positive for: Genitourinary, Endocrine, Eyes   Negative for: Constitutional, Gastrointestinal, Neurological, Skin, Musculoskeletal, HENT, Cardiovascular, Respiratory, Psychiatric, Allergic/Imm, Heme/Lymph   Last edited by Theodore Demark, COA on 08/01/2020 10:11 AM. (History)       ALLERGIES Allergies  Allergen Reactions  . Tramadol Nausea And Vomiting    PAST MEDICAL HISTORY Past Medical History:  Diagnosis Date  . Anemia   . Diabetes mellitus    Type 2   . Diabetic retinopathy of both eyes ([name]) 01/31/2019   Dr Coralyn Pear 01/2019  . ESRD (end stage renal disease) ([place])    [place] Frenisus  . Herpes simplex virus (HSV) infection    OU  . Hypertension   . Retinal detachment    OS  . Sepsis due to Streptococcus, group B ([place]) 10/05/2018   Past Surgical History:  Procedure Laterality Date  . [place] VITRECTOMY WITH 20 GAUGE MVR PORT FOR MACULAR HOLE Right 02/24/2019   Procedure: 25 GAUGE PARS PLANA VITRECTOMY WITH 20 GAUGE MVR PORT FOR MACULAR HOLE;  Surgeon: Bernarda Caffey, MD;  Location: [facility];  Service: Ophthalmology;  Laterality: Right;  . APPLICATION OF WOUND VAC Left 08/27/2018   Procedure: APPLICATION OF WOUND VAC, left neck;  Surgeon: Gaye Pollack, MD;  Location: [facility];  Service: Thoracic;  Laterality: Left;  . APPLICATION OF WOUND VAC Left 08/30/2018   Procedure: APPLICATION OF WOUND VAC;  Surgeon: Gaye Pollack, MD;  Location: [facility];  Service: Thoracic;  Laterality: Left;  . AV FISTULA PLACEMENT Left 02/15/2020   Procedure: LEFT ARM ARTERIOVENOUS (AV) FISTULA CREATION;  Surgeon: Waynetta Sandy, MD;  Location: [facility];  Service: Vascular;  Laterality: Left;  . CATARACT EXTRACTION Bilateral   . CATARACT EXTRACTION W/ INTRAOCULAR LENS  IMPLANT,  BILATERAL    . COLONOSCOPY  06/24/2011   Procedure: COLONOSCOPY;  Surgeon: [name], MD;  [facility];  Service: Endoscopy;  Laterality: N/A;  8:30 AM  . EYE SURGERY    . GAS/FLUID EXCHANGE Left 03/31/2019   Procedure: Gas/Fluid Exchange;  Surgeon: Bernarda Caffey, MD;  Location: [facility];  Service: Ophthalmology;  Laterality: Left;  . INJECTION OF SILICONE OIL  1/51/7616   Procedure: Injection Of Silicone Oil;  Surgeon: Bernarda Caffey, MD;  Location: [facility];  Service: Ophthalmology;;  . INSERTION OF DIALYSIS CATHETER Right 12/05/2019   Procedure: INSERTION OF DIALYSIS CATHETER RIGHT SUBCLAVIAN;  Surgeon: Virl Cagey, MD;  Location[facility];  Service: General;  Laterality: Right;  . INSERTION OF DIALYSIS CATHETER Right 12/07/2019   Procedure: Minor Dialysis  catheter in place- need to reposition/ adjust catheter to help with flows;  Surgeon: Virl Cagey, MD;  Location: [facility];  Service: General;  Laterality: Right;  procedure room case with 1% lidocaine, full sterile drape,  towels, full gown, large chloraprep, 4-0 Monocryl and dermabond   . INSERTION OF DIALYSIS CATHETER Right 12/08/2019   Procedure: INSERTION OF DIALYSIS CATHETER EXCHANGE;  Surgeon: Virl Cagey, MD;  [facility]: [facility];  Service: General;  Laterality: Right;  . MEMBRANE PEEL Right 02/24/2019   Procedure: Antoine Primas;  Surgeon: Bernarda Caffey, MD;  Location: [facility];  Service: Ophthalmology;  Laterality: Right;  . PARS PLANA VITRECTOMY Left 03/31/2019   Procedure: PARS PLANA VITRECTOMY WITH 25 GAUGE WITH MEMBRANE PEEL;  Surgeon: Bernarda Caffey, MD;  Location: [facility];  Service: Ophthalmology;  Laterality: Left;  . PHOTOCOAGULATION WITH LASER Right 02/24/2019   Procedure: Photocoagulation With Laser;  Surgeon: Bernarda Caffey, MD;  Location: [facility];  Service: Ophthalmology;  Laterality: Right;  . PHOTOCOAGULATION WITH LASER Left 03/31/2019   Procedure: Photocoagulation With Laser;  Surgeon: Bernarda Caffey, MD;   Location: [facility];  Service: Ophthalmology;  Laterality: Left;  . RETINAL DETACHMENT SURGERY Left 03/31/2019   TRD Repair - Dr. Bernarda Caffey  . SILICON OIL REMOVAL Right 11/27/6220   Procedure: Silicon Oil Removal;  Surgeon: Bernarda Caffey, MD;  Location: [facility];  Service: Ophthalmology;  Laterality: Right;  . STERNAL WOUND DEBRIDEMENT Left 08/27/2018   Procedure: Incision and DEBRIDEMENT Left Chest, Neck and Mediastinum;  Surgeon: Gaye Pollack, MD;  Location[facility];  Service: Thoracic;  Laterality: Left;  . STERNAL WOUND DEBRIDEMENT Left 08/30/2018   Procedure: WOUND VAC CHANGE, LEFT CHEST AND NECK, POSSIBLE DEBRIDEMENT;  Surgeon: Gaye Pollack, MD;  [facility];  Service: Thoracic;  Laterality: Left;    FAMILY HISTORY Family History  Problem Relation Age of Onset  . Hypertension Mother   . Colon cancer Neg Hx     SOCIAL HISTORY Social History   Tobacco Use  . Smoking status: Never Smoker  . Smokeless tobacco: Never Used  Vaping Use  . Vaping Use: Never used  Substance Use Topics  . Alcohol use: No  . Drug use: No         OPHTHALMIC EXAM:  Base Eye Exam    Visual Acuity (Snellen - Linear)      Right Left   Dist [place] 20/300 -1 20/200 -1   Dist ph [place] 20/150 -1 20/150 -1       Tonometry (Tonopen, 10:06 AM)      Right Left   Pressure 16 16       Pupils      Dark Light Shape React APD   Right 4 3 Irregular Minimal None   Left 3 2 Irregular Minimal None       Visual Fields      Left Right   Restrictions Partial outer superior temporal, inferior temporal, superior nasal, inferior nasal deficiencies Partial outer superior temporal, inferior temporal, superior nasal, inferior nasal deficiencies       Extraocular Movement      Right Left    Full Full       Neuro/Psych    Oriented x3: Yes   Mood/Affect: Normal       Dilation    Both eyes: 1.0% Mydriacyl, 2.5% Phenylephrine @ 10:06 AM        Slit Lamp and Fundus Exam    Slit Lamp Exam  Right Left    Lids/Lashes mild Meibomian gland dysfunction mild Meibomian gland dysfunction   Conjunctiva/Sclera subconj silicon oil bubbles, Chemosis, conj Cyst White and quiet   Cornea Trace PEE, Well healed cataract wound, arcus, Debris in tear film Trace PEE, Well healed cataract wound, arcus, Debris in tear film   Anterior Chamber Deep and quiet, no cell or flare Deep and quiet, no cell or flare   Iris slightly irregular dilation, posterior synchiae from 3:00-1200 round, focal Posterior synechiae at 1030   Lens PC IOL in good position, pigment deposition, 1+ PCO, mild pigment deposition on optic PC IOL in good position, mild pigment deposition   Vitreous Post vit, silicon oil bubble ~17% post vitrectomy, good silicone oil fill       Fundus Exam      Right Left   Disc 2+pallor, sharp rim, mild fibrosis along ST rim +pallor, sharp rim, fine NVD, mild fibrosis   C/D Ratio 0.2 0.2   Macula attached under oil, scattered MA/IRH, nasal thickening / edema -- slightly improved attached under oil, +fibrosis/edema, scattered IRH, focal bands of PRF with traction emanating from ST macula   Vessels attenuated, Tortuous Attenuated, dilated venules, Tortuous, early NVE IT arcades   Periphery Attached, dense 360 PRP laser, mild scattered fibrosis - persistent Attached, good 360 PRP laser changes, pre-retinal fibrosis extending to posterior PRP border superior and inferiorly          IMAGING AND PROCEDURES  Imaging and Procedures for @TODAY @  OCT, Retina - OU - Both Eyes       Right Eye Quality was good. Central Foveal Thickness: 601. Progression has improved. Findings include preretinal fibrosis, abnormal foveal contour, outer retinal atrophy, epiretinal membrane, intraretinal fluid, no SRF (Mild interval improvement in central edema).   Left Eye Quality was good. Central Foveal Thickness: 500. Progression has worsened. Findings include preretinal fibrosis, intraretinal fluid, abnormal foveal contour,  intraretinal hyper-reflective material, epiretinal membrane, no SRF (Mild interval increase in retinal edema).   Notes *Images captured and stored on drive  Diagnosis / Impression:  OD: Mild interval improvement in central edema OS: Mild interval increase in retinal edema   Clinical management:  See below  Abbreviations: NFP - Normal foveal profile. CME - cystoid macular edema. PED - pigment epithelial detachment. IRF - intraretinal fluid. SRF - subretinal fluid. EZ - ellipsoid zone. ERM - epiretinal membrane. ORA - outer retinal atrophy. ORT - outer retinal tubulation. SRHM - subretinal hyper-reflective material        Intravitreal Injection, Pharmacologic Agent - OD - Right Eye       Time Out 08/01/2020. 11:03 AM. Confirmed correct patient, procedure, site, and patient consented.   Anesthesia Topical anesthesia was used. Anesthetic medications included Lidocaine 2%, Proparacaine 0.5%.   Procedure Preparation included 5% betadine to ocular surface, eyelid speculum. A supplied (32 g) needle was used.   Injection:  1.25 mg Bevacizumab (AVASTIN) 1.25mg /0.11mL SOLN   NDC: H061816, Lot: [id number] , Expiration date: 09/05/2020   Route: Intravitreal, Site: Right Eye, Waste: 0 mL  Post-op Post injection exam found visual acuity of at least counting fingers. The patient tolerated the procedure well. There were no complications. The patient received written and verbal post procedure care education.        Intravitreal Injection, Pharmacologic Agent - OS - Left Eye       Time Out 08/01/2020. 11:04 AM. Confirmed correct patient, procedure, site, and patient consented.   Anesthesia Topical anesthesia was used. Anesthetic medications included Lidocaine  2%, Proparacaine 0.5%.   Procedure Preparation included 5% betadine to ocular surface, eyelid speculum. A (32 g) needle was used.   Injection:  1.25 mg Bevacizumab (AVASTIN) 1.25mg /0.8mL SOLN   NDC: H061816, Lot:  [id number], Expiration date: 09/10/2020   Route: Intravitreal, Site: Left Eye, Waste: 0.05 mL  Post-op Post injection exam found visual acuity of at least counting fingers. The patient tolerated the procedure well. There were no complications. The patient received written and verbal post procedure care education.                 ASSESSMENT/PLAN:    ICD-10-CM   1. Proliferative diabetic retinopathy of both eyes with macular edema associated with type 2 diabetes mellitus (HCC)  Q76.1950 Intravitreal Injection, Pharmacologic Agent - OD - Right Eye    Intravitreal Injection, Pharmacologic Agent - OS - Left Eye    Bevacizumab (AVASTIN) SOLN 1.25 mg    Bevacizumab (AVASTIN) SOLN 1.25 mg  2. 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 Vitreous hemorrhage of both eyes ([place])  H43.13   5. Retinal edema  H35.81 OCT, Retina - OU - Both Eyes  6. Essential hypertension  I10   7. Hypertensive retinopathy of both eyes  H35.033   8. Pseudophakia of both eyes  Z96.1     1-5. Proliferative diabetic retinopathy with DME, TRD, and vitreous hemorrhage OU (OD > OS)  - last A1c: 11.0 on 12.01.21  - lost to f/u from Jan 2021 to Oct 2021 due to loss of insurance coverage  - pt with complex medical history with hospitalization in Jan-Feb 2020 for bacteremia and abscess  - history of poor glycemic control for years  - s/p IVA OD #1 (06.20.20), #2 (07.01.20), #3 (09.11.20), #4 (10.09.20), #5 (11.06.20), #6 (11.24.21)  - s/p IVA OS #1 (06.20.20), #2 (07.01.20), #3 (08.13.20), #4 (09.11.20)  #5 (11.06.20), #6 (11.24.21)  - s/p IVE OU #1 (12.04.20) - sample; #2 (01.06.21)  - s/p STK OS #1 (10.09.20)  - s/p PRP OS (06.10.20)  - s/p PPV/PFC/EL/FAX/silicone oil OD, 93.26.71  - s/p subconj silicon oil removal OD 8.20.20  - s/p PPV/EL/FAX/silicone oil OS, 24.58.09  - BCVA 20/150 OU (OD improved from 20/200, OS stable)              - OCT OD: Mild interval  improvement in central edema; OS: Mild interval increase in retinal edema  - recommend IVA OU #7 for DME today, 12.22.21  - RBA of procedure discussed, questions answered  - informed consent obtained and signed  - see procedure note  - discussed possible need for PPV w/ membrane peel and silicon oil exchange OS due to worsening fibrosis  - f/u 4 weeks -- DFE/OCT, possible injection  6,7. Hypertensive retinopathy OU  - discussed importance of tight BP control  - monitor  8. Pseudophakia OU  - s/p CE with IOL Gershon Crane)  Ophthalmic Meds Ordered this visit:  Meds ordered this encounter  Medications  . Bevacizumab (AVASTIN) SOLN 1.25 mg  . Bevacizumab (AVASTIN) SOLN 1.25 mg      Return in about 4 weeks (around 08/29/2020) for f/u PDR OU, DFE, OCT.  There are no Patient Instructions on file for this visit.   This document serves as a record of services personally performed by Gardiner Sleeper, MD, PhD. It was created on their behalf by Roselee Nova, COMT. The creation of this record is the provider's dictation and/or  activities during the visit.  Electronically signed by: Roselee Nova, COMT 08/01/20 1:17 PM   This document serves as a record of services personally performed by Gardiner Sleeper, MD, PhD. It was created on their behalf by San Jetty. Owens Shark, OA an ophthalmic technician. The creation of this record is the provider's dictation and/or activities during the visit.    Electronically signed by: San Jetty. Owens Shark, [place] 12.22.2021 1:17 PM  Gardiner Sleeper, M.D., Ph.D. Diseases & Surgery of the Retina and Vitreous [facility]  I have reviewed the above documentation for accuracy and completeness, and I agree with the above. Gardiner Sleeper, M.D., Ph.D. 08/01/20 1:17 PM  Abbreviations: M myopia (nearsighted); A astigmatism; H hyperopia (farsighted); P presbyopia; Mrx spectacle prescription;  CTL contact lenses; OD right eye; OS left eye; OU both eyes  XT  exotropia; ET esotropia; PEK punctate epithelial keratitis; PEE punctate epithelial erosions; DES dry eye syndrome; MGD meibomian gland dysfunction; ATs artificial tears; PFAT's preservative free artificial tears; [place] nuclear sclerotic cataract; PSC posterior subcapsular cataract; ERM epi-retinal membrane; PVD posterior vitreous detachment; RD retinal detachment; DM diabetes mellitus; DR diabetic retinopathy; NPDR non-proliferative diabetic retinopathy; PDR proliferative diabetic retinopathy; CSME clinically significant macular edema; DME diabetic macular edema; dbh dot blot hemorrhages; CWS cotton wool spot; POAG primary open angle glaucoma; C/D cup-to-disc ratio; HVF humphrey visual field; GVF goldmann visual field; OCT optical coherence tomography; IOP intraocular pressure; BRVO Branch retinal vein occlusion; CRVO central retinal vein occlusion; CRAO central retinal artery occlusion; BRAO branch retinal artery occlusion; RT retinal tear; SB scleral buckle; PPV pars plana vitrectomy; VH Vitreous hemorrhage; PRP panretinal laser photocoagulation; IVK intravitreal kenalog; VMT vitreomacular traction; MH Macular hole;  NVD neovascularization of the disc; NVE neovascularization elsewhere; AREDS age related eye disease study; ARMD age related macular degeneration; POAG primary open angle glaucoma; EBMD epithelial/anterior basement membrane dystrophy; ACIOL anterior chamber intraocular lens; IOL intraocular lens; PCIOL posterior chamber intraocular lens; Phaco/IOL phacoemulsification with intraocular lens placement; [place] photorefractive keratectomy; LASIK laser assisted in situ keratomileusis; HTN hypertension; DM diabetes mellitus; COPD chronic obstructive pulmonary disease

## 2020-08-01 ENCOUNTER — Other Ambulatory Visit: Payer: Self-pay

## 2020-08-01 ENCOUNTER — Ambulatory Visit (INDEPENDENT_AMBULATORY_CARE_PROVIDER_SITE_OTHER): Payer: Medicare Other | Admitting: Ophthalmology

## 2020-08-01 ENCOUNTER — Encounter (INDEPENDENT_AMBULATORY_CARE_PROVIDER_SITE_OTHER): Payer: Self-pay | Admitting: Ophthalmology

## 2020-08-01 DIAGNOSIS — Z961 Presence of intraocular lens: Secondary | ICD-10-CM

## 2020-08-01 DIAGNOSIS — H3581 Retinal edema: Secondary | ICD-10-CM

## 2020-08-01 DIAGNOSIS — E113513 Type 2 diabetes mellitus with proliferative diabetic retinopathy with macular edema, bilateral: Secondary | ICD-10-CM | POA: Diagnosis not present

## 2020-08-01 DIAGNOSIS — H3343 Traction detachment of retina, bilateral: Secondary | ICD-10-CM | POA: Diagnosis not present

## 2020-08-01 DIAGNOSIS — H4313 Vitreous hemorrhage, bilateral: Secondary | ICD-10-CM | POA: Diagnosis not present

## 2020-08-01 DIAGNOSIS — H4921 Sixth [abducent] nerve palsy, right eye: Secondary | ICD-10-CM | POA: Diagnosis not present

## 2020-08-01 DIAGNOSIS — I10 Essential (primary) hypertension: Secondary | ICD-10-CM

## 2020-08-01 DIAGNOSIS — H35033 Hypertensive retinopathy, bilateral: Secondary | ICD-10-CM

## 2020-08-01 MED ORDER — BEVACIZUMAB CHEMO INJECTION 1.25MG/0.05ML SYRINGE FOR KALEIDOSCOPE
1.2500 mg | INTRAVITREAL | Status: AC | PRN
  Administered 2020-08-01: 13:00:00 1.25 mg via INTRAVITREAL

## 2020-08-15 ENCOUNTER — Other Ambulatory Visit: Payer: Self-pay

## 2020-08-15 ENCOUNTER — Ambulatory Visit (INDEPENDENT_AMBULATORY_CARE_PROVIDER_SITE_OTHER): Payer: Medicare Other | Admitting: Podiatry

## 2020-08-15 DIAGNOSIS — E1142 Type 2 diabetes mellitus with diabetic polyneuropathy: Secondary | ICD-10-CM | POA: Diagnosis not present

## 2020-08-15 DIAGNOSIS — L84 Corns and callosities: Secondary | ICD-10-CM

## 2020-08-15 DIAGNOSIS — B351 Tinea unguium: Secondary | ICD-10-CM | POA: Diagnosis not present

## 2020-08-15 DIAGNOSIS — M79675 Pain in left toe(s): Secondary | ICD-10-CM

## 2020-08-15 DIAGNOSIS — M79674 Pain in right toe(s): Secondary | ICD-10-CM | POA: Diagnosis not present

## 2020-08-16 ENCOUNTER — Encounter: Payer: Self-pay | Admitting: Podiatry

## 2020-08-16 NOTE — Progress Notes (Signed)
Subjective:  Patient ID: Randy Oneal, male    DOB: 07-28-1966,  MRN: [id number]  Chief Complaint  Patient presents with  . Callouses    Right great toe callus. PT stated that he is doing well he has no major concerns and denies any pain at this time.    55 y.o. male presents for wound care.  Patient presents with a follow-up of history of right hallux IPJ ulceration that has now clinically healed.  Patient has a very hyperkeratotic lesion that needs to be debrided down.  He states is painful to walk on.  He is awaiting his orthotics to get picked up.  He also states he is got thickening  Dystrophic toenails x10 that he would like to have them debrided down he denies any other acute complaints.   Review of Systems: Negative except as noted in the HPI. Denies N/V/F/Ch.  Past Medical History:  Diagnosis Date  . Anemia   . Diabetes mellitus    Type 2   . Diabetic retinopathy of both eyes ([place]) 01/31/2019   Dr Coralyn Pear 01/2019  . ESRD (end stage renal disease) ([place])    [place] [facility]  . Herpes simplex virus (HSV) infection    OU  . Hypertension   . Retinal detachment    OS  . Sepsis due to Streptococcus, group B ([place]) 10/05/2018    Current Outpatient Medications:  .  acetaminophen (TYLENOL) 500 MG tablet, Take 1,000 mg by mouth every 6 (six) hours as needed for moderate pain or headache., Disp: , Rfl:  .  amLODipine (NORVASC) 2.5 MG tablet, Take 1 tablet (2.5 mg total) by mouth daily., Disp: 30 tablet, Rfl: 3 .  atorvastatin (LIPITOR) 10 MG tablet, Take 1 tablet (10 mg total) by mouth daily., Disp: 30 tablet, Rfl: 5 .  brimonidine (ALPHAGAN) 0.2 % ophthalmic solution, Place 1 drop into both eyes daily., Disp: 10 mL, Rfl: 10 .  Bromfenac Sodium (PROLENSA) 0.07 % SOLN, Place 1 drop into both eyes 4 (four) times daily., Disp: 3 mL, Rfl: 10 .  calcium acetate (PHOSLO) 667 MG capsule, Take 1,334 mg by mouth 3 (three) times daily with meals., Disp: , Rfl:  .  Calcium Acetate 668  (169 Ca) MG TABS, TAKE 2 TABLETS BY MOUTH THREE TIMES DAILY WITH MEALS, Disp: , Rfl:  .  cinacalcet (SENSIPAR) 30 MG tablet, Take by mouth., Disp: , Rfl:  .  Continuous Blood Gluc Sensor (DEXCOM G6 SENSOR) MISC, 1 Device by Does not apply route as directed., Disp: 3 each, Rfl: 11 .  Continuous Blood Gluc Transmit (DEXCOM G6 TRANSMITTER) MISC, 1 Device by Does not apply route as directed., Disp: 1 each, Rfl: 3 .  Darbepoetin Alfa (ARANESP) 60 MCG/0.3ML SOSY injection, Inject 0.3 mLs (60 mcg total) into the vein every Monday with hemodialysis., Disp: , Rfl:  .  famotidine (PEPCID) 40 MG tablet, Take 0.5 tablets (20 mg total) by mouth at bedtime., Disp: 30 tablet, Rfl: 1 .  insulin lispro (HUMALOG KWIKPEN) 100 UNIT/ML KwikPen, Inject 7 Units into the skin daily with breakfast., Disp: 30 mL, Rfl: 1 .  Insulin Pen Needle 32G X 4 MM MISC, 1 Device by Does not apply route in the morning, at noon, in the evening, and at bedtime., Disp: 400 each, Rfl: 1 .  Methoxy PEG-Epoetin Beta (MIRCERA IJ), Mircera, Disp: , Rfl:  .  metoprolol tartrate (LOPRESSOR) 25 MG tablet, Take 1 tablet (25 mg total) by mouth 2 (two) times daily., Disp: 60 tablet,  Rfl: 1 .  MICROLET LANCETS MISC, USE FOUR TIMES A DAY AS DIRECTED., Disp: 100 each, Rfl: 0 .  nystatin (MYCOSTATIN/NYSTOP) powder, Apply topically 2 (two) times daily. To groin and scrotum (Patient taking differently: Apply 1 g topically 2 (two) times daily as needed (rash). To groin and scrotum), Disp: 15 g, Rfl: 0 .  ONETOUCH ULTRA test strip, USE TO TEST 4 TIMES DAILY., Disp: 100 strip, Rfl: 1 .  prednisoLONE acetate (PRED FORTE) 1 % ophthalmic suspension, Place 1 drop into both eyes 4 (four) times daily., Disp: 15 mL, Rfl: 3 .  sucroferric oxyhydroxide (VELPHORO) 500 MG chewable tablet, Chew 500 mg by mouth 3 (three) times daily with meals., Disp: , Rfl:  .  TRESIBA FLEXTOUCH 100 UNIT/ML FlexTouch Pen, Inject 22 Units into the skin at bedtime., Disp: 30 mL, Rfl:  1  Social History   Tobacco Use  Smoking Status Never Smoker  Smokeless Tobacco Never Used    Allergies  Allergen Reactions  . Tramadol Nausea And Vomiting   Objective:  There were no vitals filed for this visit. There is no height or weight on file to calculate BMI. Constitutional Well developed. Well nourished.  Vascular Dorsalis pedis pulses palpable bilaterally. Posterior tibial pulses palpable bilaterally. Capillary refill normal to all digits.  No cyanosis or clubbing noted. Pedal hair growth normal.  Neurologic Normal speech. Oriented to person, place, and time. Protective sensation absent Nail Exam: Pt has thick disfigured discolored nails with subungual debris noted bilateral entire nail hallux through fifth toenails.  Pain on palpation to the nails.  Dermatologic  right hallux IPJ completely reepithelialized without any underlying ulceration.  No clinical signs of infection noted.  Patient does have hammertoe deformity to bilateral feet with primary deformity of the hallux on the right side.  Orthopedic: No pain to palpation either foot.   Radiographs: None Assessment:   1. Pre-ulcerative calluses   2. Diabetic polyneuropathy associated with type 2 diabetes mellitus (HCC)   3. Pain due to onychomycosis of toenails of both feet    Plan:  Patient was evaluated and treated and all questions answered.  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  Given that patient is a high risk of developing wound I believe patient will benefit from diabetic shoes with insoles given that there is a hallux malleus contracture present leading to excessive pressure. -He is awaiting his diabetic shoes by Lil[facility] Channel. -He is at high risk of opening open ulceration leading to a toe amputation versus losing his foot.  I discussed this with  patient extensive detail.  Onychomycosis with pain  -Nails palliatively debrided as below. -Educated on self-care  Procedure: Nail Debridement Rationale: pain  Type of Debridement: manual, sharp debridement. Instrumentation: Nail nipper, rotary burr. Number of Nails: 10  Procedures and Treatment: Consent by patient was obtained for treatment procedures. The patient understood the discussion of treatment and procedures well. All questions were answered thoroughly reviewed. Debridement of mycotic and hypertrophic toenails, 1 through 5 bilateral and clearing of subungual debris. No ulceration, no infection noted.  Return Visit-Office Procedure: Patient instructed to return to the office for a follow up visit 3 months for continued evaluation and treatment.  Boneta Lucks, DPM    No follow-ups on file.     No follow-ups on file.

## 2020-08-28 NOTE — Progress Notes (Signed)
**Note Randy-Identified via Obfuscation** [facility] - Clinic Note  08/29/2020     CHIEF COMPLAINT Patient presents for Retina Follow Up   HISTORY OF PRESENT ILLNESS: Randy Oneal is a 55 y.o. male who presents to the clinic today for:  HPI    Retina Follow Up    Patient presents with  Diabetic Retinopathy.  In both eyes.  This started 4 weeks ago.  I, the attending physician,  performed the HPI with the patient and updated documentation appropriately.          Comments    Patient here for 4 weeks retina follow up for PDR OU. Patient states vision about the same. Holding its own like last time. No eye pain.        Last edited by [name], MD on 08/29/2020  8:45 PM. (History)    Pt states his overall health is improving, with BS and BP under much better control.  Pt still on dialysis, and on transplant list.  Referring physician: [name], MD [address] [place],  [place] [address]  HISTORICAL INFORMATION:   Selected notes from the MEDICAL RECORD NUMBER Referred by Dr.Mark Nile Oneal for concern of decreased vision post cataract sx   CURRENT MEDICATIONS: Current Outpatient Medications (Ophthalmic Drugs)  Medication Sig  . brimonidine (ALPHAGAN) 0.2 % ophthalmic solution Place 1 drop into both eyes daily.  . Bromfenac Sodium (PROLENSA) 0.07 % SOLN Place 1 drop into both eyes 4 (four) times daily.  . prednisoLONE acetate (PRED FORTE) 1 % ophthalmic suspension Place 1 drop into both eyes 4 (four) times daily.   No current facility-administered medications for this visit. (Ophthalmic Drugs)   Current Outpatient Medications (Other)  Medication Sig  . acetaminophen (TYLENOL) 500 MG tablet Take 1,000 mg by mouth every 6 (six) hours as needed for moderate pain or headache.  Marland Kitchen amLODipine (NORVASC) 2.5 MG tablet Take 1 tablet (2.5 mg total) by mouth daily.  Marland Kitchen atorvastatin (LIPITOR) 10 MG tablet Take 1 tablet (10 mg total) by mouth daily.  . calcium acetate (PHOSLO) 667 MG  capsule Take 1,334 mg by mouth 3 (three) times daily with meals.  . Calcium Acetate 668 (169 Ca) MG TABS TAKE 2 TABLETS BY MOUTH THREE TIMES DAILY WITH MEALS  . cinacalcet (SENSIPAR) 30 MG tablet Take by mouth.  . Continuous Blood Gluc Sensor (DEXCOM G6 SENSOR) MISC 1 Device by Does not apply route as directed.  . Continuous Blood Gluc Transmit (DEXCOM G6 TRANSMITTER) MISC 1 Device by Does not apply route as directed.  . Darbepoetin Alfa (ARANESP) 60 MCG/0.3ML SOSY injection Inject 0.3 mLs (60 mcg total) into the vein every Monday with hemodialysis.  . famotidine (PEPCID) 40 MG tablet Take 0.5 tablets (20 mg total) by mouth at bedtime.  . insulin lispro (HUMALOG KWIKPEN) 100 UNIT/ML KwikPen Inject 7 Units into the skin daily with breakfast.  . Insulin Pen Needle 32G X 4 MM MISC 1 Device by Does not apply route in the morning, at noon, in the evening, and at bedtime.  . Methoxy PEG-Epoetin Beta (MIRCERA IJ) Mircera  . metoprolol tartrate (LOPRESSOR) 25 MG tablet Take 1 tablet (25 mg total) by mouth 2 (two) times daily.  Marland Kitchen MICROLET LANCETS MISC USE FOUR TIMES A DAY AS DIRECTED.  Marland Kitchen nystatin (MYCOSTATIN/NYSTOP) powder Apply topically 2 (two) times daily. To groin and scrotum (Patient taking differently: Apply 1 g topically 2 (two) times daily as needed (rash). To groin and scrotum)  . ONETOUCH  ULTRA test strip USE TO TEST 4 TIMES DAILY.  . sucroferric oxyhydroxide (VELPHORO) 500 MG chewable tablet Chew 500 mg by mouth 3 (three) times daily with meals.  [name] 100 UNIT/ML FlexTouch Pen Inject 22 Units into the skin at bedtime.   No current facility-administered medications for this visit. (Other)      REVIEW OF SYSTEMS: ROS    Positive for: Genitourinary, Endocrine, Eyes   Negative for: Constitutional, Gastrointestinal, Neurological, Skin, Musculoskeletal, HENT, Cardiovascular, Respiratory, Psychiatric, Allergic/Imm, Heme/Lymph   Last edited by Theodore Demark, COA on 08/29/2020   3:01 PM. (History)       ALLERGIES Allergies  Allergen Reactions  . Tramadol Nausea And Vomiting    PAST MEDICAL HISTORY Past Medical History:  Diagnosis Date  . Anemia   . Diabetes mellitus    Type 2   . Diabetic retinopathy of both eyes ([place]) 01/31/2019   Dr Coralyn Pear 01/2019  . ESRD (end stage renal disease) ([place])    [place] Frenisus  . Herpes simplex virus (HSV) infection    OU  . Hypertension   . Retinal detachment    OS  . Sepsis due to Streptococcus, group B ([place]) 10/05/2018   Past Surgical History:  Procedure Laterality Date  . [place] VITRECTOMY WITH 20 GAUGE MVR PORT FOR MACULAR HOLE Right 02/24/2019   Procedure: 25 GAUGE PARS PLANA VITRECTOMY WITH 20 GAUGE MVR PORT FOR MACULAR HOLE;  Surgeon: Bernarda Caffey, MD;  Location: [place];  Service: Ophthalmology;  Laterality: Right;  . APPLICATION OF WOUND VAC Left 08/27/2018   Procedure: APPLICATION OF WOUND VAC, left neck;  Surgeon: Gaye Pollack, MD;  Location: [place];  Service: Thoracic;  Laterality: Left;  . APPLICATION OF WOUND VAC Left 08/30/2018   Procedure: APPLICATION OF WOUND VAC;  Surgeon: Gaye Pollack, MD;  Location: [place];  Service: Thoracic;  Laterality: Left;  . AV FISTULA PLACEMENT Left 02/15/2020   Procedure: LEFT ARM ARTERIOVENOUS (AV) FISTULA CREATION;  Surgeon: Waynetta Sandy, MD;  Location: [place];  Service: Vascular;  Laterality: Left;  . CATARACT EXTRACTION Bilateral   . CATARACT EXTRACTION W/ INTRAOCULAR LENS  IMPLANT, BILATERAL    . COLONOSCOPY  06/24/2011   Procedure: COLONOSCOPY;  Surgeon: [name], MD;  [facility];  Service: Endoscopy;  Laterality: N/A;  8:30 AM  . EYE SURGERY    . GAS/FLUID EXCHANGE Left 03/31/2019   Procedure: Gas/Fluid Exchange;  Surgeon: Bernarda Caffey, MD;  Location: [place];  Service: Ophthalmology;  Laterality: Left;  . INJECTION OF SILICONE OIL  9/39/0300   Procedure: Injection Of Silicone Oil;  Surgeon: Bernarda Caffey, MD;  Location:  [place];  Service: Ophthalmology;;  . INSERTION OF DIALYSIS CATHETER Right 12/05/2019   Procedure: INSERTION OF DIALYSIS CATHETER RIGHT SUBCLAVIAN;  Surgeon: Virl Cagey, MD;  Location: [facility];  Service: General;  Laterality: Right;  . INSERTION OF DIALYSIS CATHETER Right 12/07/2019   Procedure: Minor Dialysis catheter in place- need to reposition/ adjust catheter to help with flows;  Surgeon: Virl Cagey, MD;  Location: [facility];  Service: General;  Laterality: Right;  procedure room case with 1% lidocaine, full sterile drape,  towels, full gown, large chloraprep, 4-0 Monocryl and dermabond   . INSERTION OF DIALYSIS CATHETER Right 12/08/2019   Procedure: INSERTION OF DIALYSIS CATHETER EXCHANGE;  Surgeon: Virl Cagey, MD;  [facility]: [facility];  Service: General;  Laterality: Right;  . MEMBRANE PEEL Right 02/24/2019  Procedure: Antoine Primas;  Surgeon: Bernarda Caffey, MD;  Location: [place];  Service: Ophthalmology;  Laterality: Right;  . PARS PLANA VITRECTOMY Left 03/31/2019   Procedure: PARS PLANA VITRECTOMY WITH 25 GAUGE WITH MEMBRANE PEEL;  Surgeon: Bernarda Caffey, MD;  Location: [place];  Service: Ophthalmology;  Laterality: Left;  . PHOTOCOAGULATION WITH LASER Right 02/24/2019   Procedure: Photocoagulation With Laser;  Surgeon: Bernarda Caffey, MD;  Location: [place];  Service: Ophthalmology;  Laterality: Right;  . PHOTOCOAGULATION WITH LASER Left 03/31/2019   Procedure: Photocoagulation With Laser;  Surgeon: Bernarda Caffey, MD;  Location: [place];  Service: Ophthalmology;  Laterality: Left;  . RETINAL DETACHMENT SURGERY Left 03/31/2019   TRD Repair - Dr. Bernarda Caffey  . SILICON OIL REMOVAL Right 11/27/6220   Procedure: Silicon Oil Removal;  Surgeon: Bernarda Caffey, MD;  Location: [place];  Service: Ophthalmology;  Laterality: Right;  . STERNAL WOUND DEBRIDEMENT Left 08/27/2018   Procedure: Incision and DEBRIDEMENT Left Chest, Neck and Mediastinum;  Surgeon: Gaye Pollack, MD;  Location: [facility];   Service: Thoracic;  Laterality: Left;  . STERNAL WOUND DEBRIDEMENT Left 08/30/2018   Procedure: WOUND VAC CHANGE, LEFT CHEST AND NECK, POSSIBLE DEBRIDEMENT;  Surgeon: Gaye Pollack, MD;  [facility];  Service: Thoracic;  Laterality: Left;    FAMILY HISTORY Family History  Problem Relation Age of Onset  . Hypertension Mother   . Colon cancer Neg Hx     SOCIAL HISTORY Social History   Tobacco Use  . Smoking status: Never Smoker  . Smokeless tobacco: Never Used  Vaping Use  . Vaping Use: Never used  Substance Use Topics  . Alcohol use: No  . Drug use: No         OPHTHALMIC EXAM:  Base Eye Exam    Visual Acuity (Snellen - Linear)      Right Left   Dist [place] 20/300 20/200 -2   Dist ph [place] 20/150 -2 20/150       Tonometry (Tonopen, 2:57 PM)      Right Left   Pressure 14 15       Pupils      Dark Light Shape React APD   Right 4 3 Irregular Minimal None   Left 3 2 Irregular Minimal None       Visual Fields      Left Right   Restrictions Partial outer superior temporal, inferior temporal, superior nasal, inferior nasal deficiencies Partial outer superior temporal, inferior temporal, superior nasal, inferior nasal deficiencies       Extraocular Movement      Right Left    Full Full       Neuro/Psych    Oriented x3: Yes   Mood/Affect: Normal       Dilation    Both eyes: 1.0% Mydriacyl, 2.5% Phenylephrine @ 2:57 PM        Slit Lamp and Fundus Exam    Slit Lamp Exam      Right Left   Lids/Lashes mild Meibomian gland dysfunction mild Meibomian gland dysfunction   Conjunctiva/Sclera subconj silicon oil bubbles, Chemosis, conj Cyst White and quiet   Cornea Trace PEE, Well healed cataract wound, arcus, Debris in tear film Trace PEE, Well healed cataract wound, arcus, Debris in tear film   Anterior Chamber Deep and quiet, no cell or flare Deep and quiet, no cell or flare   Iris slightly irregular dilation, posterior synchiae from 3:00-1200 round, focal  Posterior synechiae at 1030   Lens PC  IOL in good position, pigment deposition, 1+ PCO, mild pigment deposition on optic PC IOL in good position, mild pigment deposition   Vitreous Post vit, silicon oil bubble ~33% post vitrectomy, good silicone oil fill       Fundus Exam      Right Left   Disc 2+pallor, sharp rim, mild fibrosis along ST rim +pallor, sharp rim, fine NVD, mild fibrosis   C/D Ratio 0.2 0.2   Macula attached under oil, scattered MA/IRH, nasal thickening / edema -- slightly worse. attached under oil, +fibrosis/edema, scattered IRH, focal bands of PRF with traction emanating from ST macula   Vessels attenuated, Tortuous Attenuated, dilated venules, Tortuous, early NVE IT arcades   Periphery Attached, dense 360 PRP laser, mild scattered fibrosis - persistent.  Focal ret hole along distal IT arcades--good laser surrounding. Attached, good 360 PRP laser changes, pre-retinal fibrosis extending to posterior PRP border superior and inferiorly          IMAGING AND PROCEDURES  Imaging and Procedures for @TODAY @  OCT, Retina - OU - Both Eyes       Right Eye Quality was good. Central Foveal Thickness: 716. Progression has worsened. Findings include preretinal fibrosis, abnormal foveal contour, outer retinal atrophy, epiretinal membrane, intraretinal fluid, no SRF (Interval increase in IRF).   Left Eye Quality was good. Central Foveal Thickness: 599. Progression has worsened. Findings include preretinal fibrosis, intraretinal fluid, abnormal foveal contour, intraretinal hyper-reflective material, epiretinal membrane, no SRF (interval increase in IRF).   Notes *Images captured and stored on drive  Diagnosis / Impression:  +DME OU Interval increase in IRF OU under silicon oil   Clinical management:  See below  Abbreviations: NFP - Normal foveal profile. CME - cystoid macular edema. PED - pigment epithelial detachment. IRF - intraretinal fluid. SRF - subretinal fluid. EZ -  ellipsoid zone. ERM - epiretinal membrane. ORA - outer retinal atrophy. ORT - outer retinal tubulation. SRHM - subretinal hyper-reflective material        Intravitreal Injection, Pharmacologic Agent - OD - Right Eye       Time Out 08/29/2020. 3:19 PM. Confirmed correct patient, procedure, site, and patient consented.   Anesthesia Topical anesthesia was used. Anesthetic medications included Lidocaine 2%, Proparacaine 0.5%.   Procedure Preparation included 5% betadine to ocular surface, eyelid speculum. A supplied (32 g) needle was used.   Injection:  1.25 mg Bevacizumab (AVASTIN) 1.25mg /0.66mL SOLN   NDC: 29518-841-66, Lot: [id number] , Expiration date: 09/19/2020   Route: Intravitreal, Site: Right Eye, Waste: 0 mL  Post-op Post injection exam found visual acuity of at least counting fingers. The patient tolerated the procedure well. There were no complications. The patient received written and verbal post procedure care education.        Intravitreal Injection, Pharmacologic Agent - OS - Left Eye       Time Out 08/29/2020. 3:20 PM. Confirmed correct patient, procedure, site, and patient consented.   Anesthesia Topical anesthesia was used. Anesthetic medications included Lidocaine 2%, Proparacaine 0.5%.   Procedure Preparation included 5% betadine to ocular surface, eyelid speculum. A (32 g) needle was used.   Injection:  1.25 mg Bevacizumab (AVASTIN) 1.25mg /0.70mL SOLN   NDC: 80m, [id number], Expiration date: 09/27/2020   Route: Intravitreal, Site: Left Eye, Waste: 0.05 mL  Post-op Post injection exam found visual acuity of at least counting fingers. The patient tolerated the procedure well. There were no complications. The patient received written and verbal post procedure care education.  ASSESSMENT/PLAN:    ICD-10-CM   1. Proliferative diabetic retinopathy of both eyes with macular edema associated with type 2 diabetes mellitus (HCC)   Z61.0960 Intravitreal Injection, Pharmacologic Agent - OD - Right Eye    Intravitreal Injection, Pharmacologic Agent - OS - Left Eye    Bevacizumab (AVASTIN) SOLN 1.25 mg    Bevacizumab (AVASTIN) SOLN 1.25 mg  2. 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 Vitreous hemorrhage of both eyes ([place])  H43.13   5. Retinal edema  H35.81 OCT, Retina - OU - Both Eyes  6. Essential hypertension  I10   7. Hypertensive retinopathy of both eyes  H35.033   8. Pseudophakia of both eyes  Z96.1    1-5. Proliferative diabetic retinopathy with DME, TRD, and vitreous hemorrhage OU (OD > OS)  - last A1c: 11.0 on 12.01.21  - lost to f/u from Jan 2021 to Oct 2021 due to loss of insurance coverage  - pt with complex medical history with hospitalization in Jan-Feb 2020 for bacteremia and abscess  - history of poor glycemic control for years  - s/p IVA OD #1 (06.20.20), #2 (07.01.20), #3 (09.11.20), #4 (10.09.20), #5 (11.06.20), #6 (11.24.21), #7 (12.22.21)  - s/p IVA OS #1 (06.20.20), #2 (07.01.20), #3 (08.13.20), #4 (09.11.20)  #5 (11.06.20), #6 (11.24.21), #7 (12.22.21)  - s/p IVE OU #1 (12.04.20) - sample; #2 (01.06.21)  - s/p STK OS #1 (10.09.20)  - s/p PRP OS (06.10.20)  - s/p PPV/PFC/EL/FAX/silicone oil OD, 45.40.98  - s/p subconj silicon oil removal OD 8.20.20  - s/p PPV/EL/FAX/silicone oil OS, 11.91.47  - BCVA 20/150 OU (stable OU)              - OCT: interval increase in IRF OU  - discussed possible resistance to IVA and potential switch in medication  - recommend IVA OU #8 for DME today, 01.19.22  - RBA of procedure discussed, questions answered  - informed consent obtained and signed  - see procedure note             - [place] benefits investigation begun 1.19.22  - discussed possible need for PPV w/ membrane peel and silicon oil exchange OS due to worsening fibrosis  - f/u 4 weeks -- DFE/OCT, possible injection  6,7. Hypertensive retinopathy  OU  - discussed importance of tight BP control  - monitor  8. Pseudophakia OU  - s/p CE with IOL Gershon Crane)  Ophthalmic Meds Ordered this visit:  Meds ordered this encounter  Medications  . Bevacizumab (AVASTIN) SOLN 1.25 mg  . Bevacizumab (AVASTIN) SOLN 1.25 mg      Return in about 4 weeks (around 09/26/2020) for 4 wk f/u for PDR w/DME OU w/DFE/OCT/likely injs..  There are no Patient Instructions on file for this visit.  This document serves as a record of services personally performed by Gardiner Sleeper, MD, PhD. It was created on their behalf by Roselee Nova, COMT. The creation of this record is the provider's dictation and/or activities during the visit.  Electronically signed by: Roselee Nova, COMT 08/29/20 8:54 PM   This document serves as a record of services personally performed by Gardiner Sleeper, MD, PhD. It was created on their behalf by San Jetty. Owens Shark, OA an ophthalmic technician. The creation of this record is the provider's dictation and/or activities during the visit.    Electronically signed by: San Jetty. Owens Shark, [place] 01.19.2022 8:54 PM  This document  serves as a record of services personally performed by Gardiner Sleeper, MD, PhD. It was created on their behalf by San Jetty. Owens Shark, OA an ophthalmic technician. The creation of this record is the provider's dictation and/or activities during the visit.    Gardiner Sleeper, M.D., Ph.D. Diseases & Surgery of the Retina and [place] 1.19.22  I have reviewed the above documentation for accuracy and completeness, and I agree with the above. Gardiner Sleeper, M.D., Ph.D. 08/29/20 8:54 PM   Abbreviations: M myopia (nearsighted); A astigmatism; H hyperopia (farsighted); P presbyopia; Mrx spectacle prescription;  CTL contact lenses; OD right eye; OS left eye; OU both eyes  XT exotropia; ET esotropia; PEK punctate epithelial keratitis; PEE punctate epithelial erosions; DES dry eye syndrome; MGD  meibomian gland dysfunction; ATs artificial tears; PFAT's preservative free artificial tears; [place] nuclear sclerotic cataract; PSC posterior subcapsular cataract; ERM epi-retinal membrane; PVD posterior vitreous detachment; RD retinal detachment; DM diabetes mellitus; DR diabetic retinopathy; NPDR non-proliferative diabetic retinopathy; PDR proliferative diabetic retinopathy; CSME clinically significant macular edema; DME diabetic macular edema; dbh dot blot hemorrhages; CWS cotton wool spot; POAG primary open angle glaucoma; C/D cup-to-disc ratio; HVF humphrey visual field; GVF goldmann visual field; OCT optical coherence tomography; IOP intraocular pressure; BRVO Branch retinal vein occlusion; CRVO central retinal vein occlusion; CRAO central retinal artery occlusion; BRAO branch retinal artery occlusion; RT retinal tear; SB scleral buckle; PPV pars plana vitrectomy; VH Vitreous hemorrhage; PRP panretinal laser photocoagulation; IVK intravitreal kenalog; VMT vitreomacular traction; MH Macular hole;  NVD neovascularization of the disc; NVE neovascularization elsewhere; AREDS age related eye disease study; ARMD age related macular degeneration; POAG primary open angle glaucoma; EBMD epithelial/anterior basement membrane dystrophy; ACIOL anterior chamber intraocular lens; IOL intraocular lens; PCIOL posterior chamber intraocular lens; Phaco/IOL phacoemulsification with intraocular lens placement; [place] photorefractive keratectomy; LASIK laser assisted in situ keratomileusis; HTN hypertension; DM diabetes mellitus; COPD chronic obstructive pulmonary disease

## 2020-08-29 ENCOUNTER — Ambulatory Visit (INDEPENDENT_AMBULATORY_CARE_PROVIDER_SITE_OTHER): Payer: Medicare Other | Admitting: Ophthalmology

## 2020-08-29 ENCOUNTER — Encounter (INDEPENDENT_AMBULATORY_CARE_PROVIDER_SITE_OTHER): Payer: Self-pay | Admitting: Ophthalmology

## 2020-08-29 ENCOUNTER — Other Ambulatory Visit: Payer: Self-pay

## 2020-08-29 DIAGNOSIS — H3581 Retinal edema: Secondary | ICD-10-CM | POA: Diagnosis not present

## 2020-08-29 DIAGNOSIS — Z961 Presence of intraocular lens: Secondary | ICD-10-CM

## 2020-08-29 DIAGNOSIS — I10 Essential (primary) hypertension: Secondary | ICD-10-CM

## 2020-08-29 DIAGNOSIS — H4921 Sixth [abducent] nerve palsy, right eye: Secondary | ICD-10-CM | POA: Diagnosis not present

## 2020-08-29 DIAGNOSIS — E113513 Type 2 diabetes mellitus with proliferative diabetic retinopathy with macular edema, bilateral: Secondary | ICD-10-CM

## 2020-08-29 DIAGNOSIS — H4313 Vitreous hemorrhage, bilateral: Secondary | ICD-10-CM

## 2020-08-29 DIAGNOSIS — H3343 Traction detachment of retina, bilateral: Secondary | ICD-10-CM | POA: Diagnosis not present

## 2020-08-29 DIAGNOSIS — H35033 Hypertensive retinopathy, bilateral: Secondary | ICD-10-CM

## 2020-08-29 MED ORDER — BEVACIZUMAB CHEMO INJECTION 1.25MG/0.05ML SYRINGE FOR KALEIDOSCOPE
1.2500 mg | INTRAVITREAL | Status: AC | PRN
  Administered 2020-08-29: 1.25 mg via INTRAVITREAL

## 2020-09-25 NOTE — Progress Notes (Addendum)
[facility] Note  09/28/2020     CHIEF COMPLAINT Patient presents for Retina Follow Up   HISTORY OF PRESENT ILLNESS: Randy Oneal is a 55 y.o. male who presents to the clinic today for:  HPI    Retina Follow Up    Patient presents with  Diabetic Retinopathy.  In both eyes.  This started years ago.  Severity is moderate.  Duration of 4 weeks.  Since onset it is stable.  I, the attending physician,  performed the HPI with the patient and updated documentation appropriately.          Comments    55 y/o male pt here for 4 wk f/u for PDR w/DME OU.  No change in [place] OU.  Denies pain, FOL, floaters.  Brimonidine BID OU.  BS 134 this a.m.  A1C 9.0.       Last edited by Randy Caffey, MD on 09/30/2020 12:52 PM. (History)    Pt states   Referring physician: [name], MD [place],  [place] [id number]  HISTORICAL INFORMATION:   Selected notes from the MEDICAL RECORD NUMBER Referred by Dr.Mark Gershon Oneal for concern of decreased vision post cataract sx   CURRENT MEDICATIONS: Current Outpatient Medications (Ophthalmic Drugs)  Medication Sig  . brimonidine (ALPHAGAN) 0.2 % ophthalmic solution Place 1 drop into both eyes daily.  . Bromfenac Sodium (PROLENSA) 0.07 % SOLN Place 1 drop into both eyes 4 (four) times daily.  . prednisoLONE acetate (PRED FORTE) 1 % ophthalmic suspension Place 1 drop into both eyes 4 (four) times daily.   No current facility-administered medications for this visit. (Ophthalmic Drugs)   Current Outpatient Medications (Other)  Medication Sig  . acetaminophen (TYLENOL) 500 MG tablet Take 1,000 mg by mouth every 6 (six) hours as needed for moderate pain or headache.  Marland Kitchen amLODipine (NORVASC) 2.5 MG tablet Take 1 tablet (2.5 mg total) by mouth daily.  Marland Kitchen atorvastatin (LIPITOR) 10 MG tablet Take 1 tablet (10 mg total) by mouth daily.  . calcium acetate (PHOSLO) 667 MG capsule Take 1,334 mg by mouth 3 (three)  times daily with meals.  . Calcium Acetate 668 (169 Ca) MG TABS TAKE 2 TABLETS BY MOUTH THREE TIMES DAILY WITH MEALS  . cinacalcet (SENSIPAR) 30 MG tablet Take by mouth.  . Continuous Blood Gluc Sensor (DEXCOM G6 SENSOR) MISC 1 Device by Does not apply route as directed.  . Continuous Blood Gluc Transmit (DEXCOM G6 TRANSMITTER) MISC 1 Device by Does not apply route as directed.  . Darbepoetin Alfa (ARANESP) 60 MCG/0.3ML SOSY injection Inject 0.3 mLs (60 mcg total) into the vein every Monday with hemodialysis.  . famotidine (PEPCID) 40 MG tablet Take 0.5 tablets (20 mg total) by mouth at bedtime.  . insulin lispro (HUMALOG KWIKPEN) 100 UNIT/ML KwikPen Inject 7 Units into the skin daily with breakfast.  . Insulin Pen Needle 32G X 4 MM MISC 1 Device by Does not apply route in the morning, at noon, in the evening, and at bedtime.  . lidocaine-prilocaine (EMLA) cream SMARTSIG:Sparingly Topical 3 Times a Week  . Methoxy PEG-Epoetin Beta (MIRCERA IJ) Mircera  . metoprolol tartrate (LOPRESSOR) 25 MG tablet Take 1 tablet (25 mg total) by mouth 2 (two) times daily.  Marland Kitchen MICROLET LANCETS MISC USE FOUR TIMES A DAY AS DIRECTED.  Marland Kitchen nystatin (MYCOSTATIN/NYSTOP) powder Apply topically 2 (two) times daily. To groin and scrotum (Patient taking differently: Apply 1 g topically 2 (two)  times daily as needed (rash). To groin and scrotum)  . ONETOUCH ULTRA test strip USE TO TEST 4 TIMES DAILY.  . pantoprazole (PROTONIX) 40 MG tablet Take by mouth.  . sucroferric oxyhydroxide (VELPHORO) 500 MG chewable tablet Chew 500 mg by mouth 3 (three) times daily with meals.  Randy Oneal FLEXTOUCH 100 UNIT/ML FlexTouch Pen Inject 22 Units into the skin at bedtime.   No current facility-administered medications for this visit. (Other)      REVIEW OF SYSTEMS: ROS    Positive for: Genitourinary, Endocrine, Eyes   Negative for: Constitutional, Gastrointestinal, Neurological, Skin, Musculoskeletal, HENT, Cardiovascular, Respiratory,  Psychiatric, Allergic/Imm, Heme/Lymph   Last edited by Randy Oneal, COA on 09/28/2020  2:24 PM. (History)       ALLERGIES Allergies  Allergen Reactions  . Tramadol Nausea And Vomiting    PAST MEDICAL HISTORY Past Medical History:  Diagnosis Date  . Anemia   . Diabetes mellitus    Type 2   . Diabetic retinopathy of both eyes ([place]) 01/31/2019   Dr Coralyn Pear 01/2019  . ESRD (end stage renal disease) ([place])    [place] Frenisus  . Herpes simplex virus (HSV) infection    OU  . Hypertension   . Retinal detachment    OS  . Sepsis due to Streptococcus, group B ([place]) 10/05/2018   Past Surgical History:  Procedure Laterality Date  . [place] VITRECTOMY WITH 20 GAUGE MVR PORT FOR MACULAR HOLE Right 02/24/2019   Procedure: 25 GAUGE PARS PLANA VITRECTOMY WITH 20 GAUGE MVR PORT FOR MACULAR HOLE;  Surgeon: Randy Caffey, MD;  Location: [facility];  Service: Ophthalmology;  Laterality: Right;  . APPLICATION OF WOUND VAC Left 08/27/2018   Procedure: APPLICATION OF WOUND VAC, left neck;  Surgeon: Gaye Pollack, MD;  Location: [place];  Service: Thoracic;  Laterality: Left;  . APPLICATION OF WOUND VAC Left 08/30/2018   Procedure: APPLICATION OF WOUND VAC;  Surgeon: Gaye Pollack, MD;  Location: [facility];  Service: Thoracic;  Laterality: Left;  . AV FISTULA PLACEMENT Left 02/15/2020   Procedure: LEFT ARM ARTERIOVENOUS (AV) FISTULA CREATION;  Surgeon: Waynetta Sandy, MD;  Location: [facility];  Service: Vascular;  Laterality: Left;  . CATARACT EXTRACTION Bilateral   . CATARACT EXTRACTION W/ INTRAOCULAR LENS  IMPLANT, BILATERAL    . COLONOSCOPY  06/24/2011   Procedure: COLONOSCOPY;  Surgeon: [name], MD;  [facility];  Service: Endoscopy;  Laterality: N/A;  8:30 AM  . EYE SURGERY    . GAS/FLUID EXCHANGE Left 03/31/2019   Procedure: Gas/Fluid Exchange;  Surgeon: Randy Caffey, MD;  Location: [facility];  Service: Ophthalmology;  Laterality: Left;  . INJECTION OF SILICONE OIL   4/53/6468   Procedure: Injection Of Silicone Oil;  Surgeon: Randy Caffey, MD;  Location: [facility];  Service: Ophthalmology;;  . INSERTION OF DIALYSIS CATHETER Right 12/05/2019   Procedure: INSERTION OF DIALYSIS CATHETER RIGHT SUBCLAVIAN;  Surgeon: Virl Cagey, MD;  Location[facility];  Service: General;  Laterality: Right;  . INSERTION OF DIALYSIS CATHETER Right 12/07/2019   Procedure: Minor Dialysis catheter in place- need to reposition/ adjust catheter to help with flows;  Surgeon: Virl Cagey, MD;  Location: [facility];  Service: General;  Laterality: Right;  procedure room case with 1% lidocaine, full sterile drape,  towels, full gown, large chloraprep, 4-0 Monocryl and dermabond   . INSERTION OF DIALYSIS CATHETER Right 12/08/2019   Procedure: INSERTION OF DIALYSIS CATHETER EXCHANGE;  Surgeon: Virl Cagey, MD;  [facility]: [facility];  Service: General;  Laterality: Right;  . MEMBRANE PEEL Right 02/24/2019   Procedure: Antoine Primas;  Surgeon: Randy Caffey, MD;  Location: [facility];  Service: Ophthalmology;  Laterality: Right;  . PARS PLANA VITRECTOMY Left 03/31/2019   Procedure: PARS PLANA VITRECTOMY WITH 25 GAUGE WITH MEMBRANE PEEL;  Surgeon: Randy Caffey, MD;  Location: [facility];  Service: Ophthalmology;  Laterality: Left;  . PHOTOCOAGULATION WITH LASER Right 02/24/2019   Procedure: Photocoagulation With Laser;  Surgeon: Randy Caffey, MD;  Location: [facility];  Service: Ophthalmology;  Laterality: Right;  . PHOTOCOAGULATION WITH LASER Left 03/31/2019   Procedure: Photocoagulation With Laser;  Surgeon: Randy Caffey, MD;  Location: [facility];  Service: Ophthalmology;  Laterality: Left;  . RETINAL DETACHMENT SURGERY Left 03/31/2019   TRD Repair - Dr. Bernarda Oneal  . SILICON OIL REMOVAL Right 8/58/8502   Procedure: Silicon Oil Removal;  Surgeon: Randy Caffey, MD;  Location: [place];  Service: Ophthalmology;  Laterality: Right;  . STERNAL WOUND DEBRIDEMENT Left 08/27/2018   Procedure: Incision and  DEBRIDEMENT Left Chest, Neck and Mediastinum;  Surgeon: Gaye Pollack, MD;  Location: [facility];  Service: Thoracic;  Laterality: Left;  . STERNAL WOUND DEBRIDEMENT Left 08/30/2018   Procedure: WOUND VAC CHANGE, LEFT CHEST AND NECK, POSSIBLE DEBRIDEMENT;  Surgeon: Gaye Pollack, MD;  Location[facility];  Service: Thoracic;  Laterality: Left;    FAMILY HISTORY Family History  Problem Relation Age of Onset  . Hypertension Mother   . Colon cancer Neg Hx     SOCIAL HISTORY Social History   Tobacco Use  . Smoking status: Never Smoker  . Smokeless tobacco: Never Used  Vaping Use  . Vaping Use: Never used  Substance Use Topics  . Alcohol use: No  . Drug use: No         OPHTHALMIC EXAM:  Base Eye Exam    Visual Acuity (Snellen - Linear)      Right Left   Dist [place] 20/250 - 20/250   Dist ph [place] 20/150 -2 20/150 -2       Tonometry (Tonopen, 2:29 PM)      Right Left   Pressure 15 15       Pupils      Dark Light Shape React APD   Right 4 3 Irregular Minimal None   Left 3 2 Irregular Minimal None       Visual Fields (Counting fingers)      Left Right   Restrictions Partial outer superior temporal, inferior temporal, superior nasal, inferior nasal deficiencies Partial outer superior temporal, inferior temporal, superior nasal, inferior nasal deficiencies       Extraocular Movement      Right Left    Full Full       Neuro/Psych    Oriented x3: Yes   Mood/Affect: Normal       Dilation    Both eyes: 1.0% Mydriacyl, 2.5% Phenylephrine @ 2:30 PM        Slit Lamp and Fundus Exam    Slit Lamp Exam      Right Left   Lids/Lashes mild Meibomian gland dysfunction mild Meibomian gland dysfunction   Conjunctiva/Sclera subconj silicon oil bubbles, Chemosis, conj Cyst White and quiet   Cornea Trace PEE, Well healed cataract wound, arcus, Debris in tear film Trace PEE, Well healed cataract wound, arcus, Debris in tear film   Anterior Chamber Deep and quiet, no cell or flare Deep  and quiet, no cell or flare   Iris slightly irregular dilation, posterior synchiae from 3:00-1200 round, focal Posterior synechiae at 1030   Lens PC IOL in good position, pigment deposition, 1+ PCO, mild pigment deposition on optic PC IOL in good position, mild pigment deposition   Vitreous Post vit, silicon oil bubble ~44% post vitrectomy, good silicone oil fill       Fundus Exam      Right Left   Disc 2+pallor, sharp rim, mild fibrosis along ST rim +pallor, sharp rim, fine NVD, mild fibrosis   C/D Ratio 0.2 0.2   Macula attached under oil, scattered MA/IRH, nasal and central thickening / edema -- slightly worse attached under oil, +fibrosis/edema, scattered IRH, focal bands of PRF with traction emanating from ST macula, cluster of DBH inferior mac   Vessels attenuated, Tortuous Attenuated, dilated venules, Tortuous, early NVE IT arcades   Periphery Attached, dense 360 PRP laser, mild scattered fibrosis - persistent, Focal ret hole along distal IT arcades--good laser surrounding Attached, good 360 PRP laser changes, pre-retinal fibrosis extending to posterior PRP border superior and inferiorly          IMAGING AND PROCEDURES  Imaging and Procedures for _0 @  OCT, Retina - OU - Both Eyes       Right Eye Quality was good. Central Foveal Thickness: 777. Progression has worsened. Findings include preretinal fibrosis, abnormal foveal contour, outer retinal atrophy, epiretinal membrane, intraretinal fluid, no SRF (Interval increase in IRF/edema).   Left Eye Quality was good. Central Foveal Thickness: 638. Progression has worsened. Findings include preretinal fibrosis, intraretinal fluid, abnormal foveal contour, intraretinal hyper-reflective material, epiretinal membrane, no SRF (interval increase in IRF/edema).   Notes *Images captured and stored on drive  Diagnosis / Impression:  +DME OU Interval increase in IRF/edema OU under silicon oil   Clinical management:  See  below  Abbreviations: NFP - Normal foveal profile. CME - cystoid macular edema. PED - pigment epithelial detachment. IRF - intraretinal fluid. SRF - subretinal fluid. EZ - ellipsoid zone. ERM - epiretinal membrane. ORA - outer retinal atrophy. ORT - outer retinal tubulation. SRHM - subretinal hyper-reflective material        Intravitreal Injection, Pharmacologic Agent - OD - Right Eye       Time Out 09/28/2020. 3:07 PM. Confirmed correct patient, procedure, site, and patient consented.   Anesthesia Topical anesthesia was used. Anesthetic medications included Lidocaine 2%, Proparacaine 0.5%.   Procedure Preparation included 5% betadine to ocular surface, eyelid speculum. A supplied (32 g) needle was used.   Injection:  2 mg aflibercept Alfonse Flavors) SOLN   NDC: A3590391, Lot: [id number], Expiration date: 01/07/2021   Route: Intravitreal, Site: Right Eye, Waste: 0.05 mL  Post-op Post injection exam found visual acuity of at least counting fingers. The patient tolerated the procedure well. There were no complications. The patient received written and verbal post procedure care education.        Intravitreal Injection, Pharmacologic Agent - OS - Left Eye       Time Out 09/28/2020. 3:07 PM. Confirmed correct patient, procedure, site, and patient consented.   Anesthesia Topical anesthesia was used. Anesthetic medications included Lidocaine 2%, Proparacaine 0.5%.   Procedure Preparation included 5% betadine to ocular surface, eyelid speculum. A (32 g) needle was used.   Injection:  2 mg aflibercept Alfonse Flavors) SOLN   NDC: M7179715, Lot: [id number], Expiration date: 02/07/2021   Route: Intravitreal, Site: Left Eye, Waste: 0.05 mL  Post-op Post injection exam found visual acuity  of at least counting fingers. The patient tolerated the procedure well. There were no complications. The patient received written and verbal post procedure care education.                  ASSESSMENT/PLAN:    ICD-10-CM   1. Proliferative diabetic retinopathy of both eyes with macular edema associated with type 2 diabetes mellitus (HCC)  S34.1962 Intravitreal Injection, Pharmacologic Agent - OD - Right Eye    Intravitreal Injection, Pharmacologic Agent - OS - Left Eye    aflibercept (EYLEA) SOLN 2 mg    aflibercept (EYLEA) SOLN 2 mg  2. 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 Vitreous hemorrhage of both eyes ([place])  H43.13   4. Retinal edema  H35.81 OCT, Retina - OU - Both Eyes  5. Essential hypertension  I10   6. Hypertensive retinopathy of both eyes  H35.033   7. Pseudophakia of both eyes  Z96.1   8. CME (cystoid macular edema), bilateral  H35.353    1-4. Proliferative diabetic retinopathy with DME, TRD, and vitreous hemorrhage OU (OD > OS)  - last A1c: 11.0 on 12.01.21  - lost to f/u from Jan 2021 to Oct 2021 due to loss of insurance coverage  - pt with complex medical history with hospitalization in Jan-Feb 2020 for bacteremia and abscess  - history of poor glycemic control for years  - s/p IVA OD #1 (06.20.20), #2 (07.01.20), #3 (09.11.20), #4 (10.09.20), #5 (11.06.20), #6 (11.24.21), #7 (12.22.21), #8 (01.19.22)  - s/p IVA OS #1 (06.20.20), #2 (07.01.20), #3 (08.13.20), #4 (09.11.20)  #5 (11.06.20), #6 (11.24.21), #7 (12.22.21), #8 (01.19.22)  - s/p IVE OU #1 (12.04.20) - sample; #2 (01.06.21)  - s/p STK OS #1 (10.09.20)  - s/p PRP OS (06.10.20)  - s/p PPV/PFC/EL/FAX/silicone oil OD, 22.97.98  - s/p subconj silicon oil removal OD 8.20.20  - s/p PPV/EL/FAX/silicone oil OS, 92.11.94  - BCVA 20/150 OU (stable OU)              - OCT: interval increase in IRF/edema OU  - discussed possible resistance to IVA and potential switch in medication  - recommend IVE OU #3 for DME today, 02.18.22  - RBA of procedure discussed, questions answered  - informed consent obtained and signed  - see procedure note             - Eylea benefits investigation begun  1.19.22  - discussed possible need for PPV w/ membrane peel and silicon oil exchange OS due to worsening fibrosis  - start topical PF and Prolensa QID OU  - f/u 4 weeks -- DFE/OCT, possible injection  5,6. Hypertensive retinopathy OU  - discussed importance of tight BP control  - monitor  7. Pseudophakia OU  - s/p CE with IOL Gershon Oneal)  Ophthalmic Meds Ordered this visit:  Meds ordered this encounter  Medications  . prednisoLONE acetate (PRED FORTE) 1 % ophthalmic suspension    Sig: Place 1 drop into both eyes 4 (four) times daily.    Dispense:  15 mL    Refill:  3  . Bromfenac Sodium (PROLENSA) 0.07 % SOLN    Sig: Place 1 drop into both eyes 4 (four) times daily.    Dispense:  6 mL    Refill:  10  . aflibercept (EYLEA) SOLN 2 mg  . aflibercept (EYLEA) SOLN 2 mg      Return in about 4 weeks (around 10/26/2020) for PDR w/ DME - Dilated Exam, OCT,  Possible Injxn.  There are no Patient Instructions on file for this visit.  This document serves as a record of services personally performed by Gardiner Sleeper, MD, PhD. It was created on their behalf by San Jetty. Owens Shark, OA an ophthalmic technician. The creation of this record is the provider's dictation and/or activities during the visit.    Electronically signed by: San Jetty. Owens Shark, [place] 02.15.2022 1:11 PM  Gardiner Sleeper, M.D., Ph.D. Diseases & Surgery of the Retina and Vitreous [facility]  I have reviewed the above documentation for accuracy and completeness, and I agree with the above. Gardiner Sleeper, M.D., Ph.D. 09/30/20 1:11 PM  Abbreviations: M myopia (nearsighted); A astigmatism; H hyperopia (farsighted); P presbyopia; Mrx spectacle prescription;  CTL contact lenses; OD right eye; OS left eye; OU both eyes  XT exotropia; ET esotropia; PEK punctate epithelial keratitis; PEE punctate epithelial erosions; DES dry eye syndrome; MGD meibomian gland dysfunction; ATs artificial tears; PFAT's preservative  free artificial tears; [place] nuclear sclerotic cataract; PSC posterior subcapsular cataract; ERM epi-retinal membrane; PVD posterior vitreous detachment; RD retinal detachment; DM diabetes mellitus; DR diabetic retinopathy; NPDR non-proliferative diabetic retinopathy; PDR proliferative diabetic retinopathy; CSME clinically significant macular edema; DME diabetic macular edema; dbh dot blot hemorrhages; CWS cotton wool spot; POAG primary open angle glaucoma; C/D cup-to-disc ratio; HVF humphrey visual field; GVF goldmann visual field; OCT optical coherence tomography; IOP intraocular pressure; BRVO Branch retinal vein occlusion; CRVO central retinal vein occlusion; CRAO central retinal artery occlusion; BRAO branch retinal artery occlusion; RT retinal tear; SB scleral buckle; PPV pars plana vitrectomy; VH Vitreous hemorrhage; PRP panretinal laser photocoagulation; IVK intravitreal kenalog; VMT vitreomacular traction; MH Macular hole;  NVD neovascularization of the disc; NVE neovascularization elsewhere; AREDS age related eye disease study; ARMD age related macular degeneration; POAG primary open angle glaucoma; EBMD epithelial/anterior basement membrane dystrophy; ACIOL anterior chamber intraocular lens; IOL intraocular lens; PCIOL posterior chamber intraocular lens; Phaco/IOL phacoemulsification with intraocular lens placement; [place] photorefractive keratectomy; LASIK laser assisted in situ keratomileusis; HTN hypertension; DM diabetes mellitus; COPD chronic obstructive pulmonary disease

## 2020-09-28 ENCOUNTER — Encounter (INDEPENDENT_AMBULATORY_CARE_PROVIDER_SITE_OTHER): Payer: Self-pay | Admitting: Ophthalmology

## 2020-09-28 ENCOUNTER — Other Ambulatory Visit: Payer: Self-pay

## 2020-09-28 ENCOUNTER — Ambulatory Visit (INDEPENDENT_AMBULATORY_CARE_PROVIDER_SITE_OTHER): Payer: Medicare Other | Admitting: Ophthalmology

## 2020-09-28 DIAGNOSIS — I10 Essential (primary) hypertension: Secondary | ICD-10-CM

## 2020-09-28 DIAGNOSIS — Z961 Presence of intraocular lens: Secondary | ICD-10-CM

## 2020-09-28 DIAGNOSIS — E113513 Type 2 diabetes mellitus with proliferative diabetic retinopathy with macular edema, bilateral: Secondary | ICD-10-CM | POA: Diagnosis not present

## 2020-09-28 DIAGNOSIS — H4313 Vitreous hemorrhage, bilateral: Secondary | ICD-10-CM

## 2020-09-28 DIAGNOSIS — H35352 Cystoid macular degeneration, left eye: Secondary | ICD-10-CM

## 2020-09-28 DIAGNOSIS — H35353 Cystoid macular degeneration, bilateral: Secondary | ICD-10-CM

## 2020-09-28 DIAGNOSIS — H3343 Traction detachment of retina, bilateral: Secondary | ICD-10-CM

## 2020-09-28 DIAGNOSIS — H3581 Retinal edema: Secondary | ICD-10-CM

## 2020-09-28 DIAGNOSIS — H4921 Sixth [abducent] nerve palsy, right eye: Secondary | ICD-10-CM

## 2020-09-28 DIAGNOSIS — H35033 Hypertensive retinopathy, bilateral: Secondary | ICD-10-CM

## 2020-09-28 MED ORDER — PREDNISOLONE ACETATE 1 % OP SUSP: 1.0000 [drp] | Freq: Four times a day (QID) | OPHTHALMIC | 3 refills | Status: DC

## 2020-09-28 MED ORDER — PROLENSA 0.07 % OP SOLN: 1.0000 [drp] | Freq: Four times a day (QID) | OPHTHALMIC | 10 refills | Status: DC

## 2020-09-30 ENCOUNTER — Encounter (INDEPENDENT_AMBULATORY_CARE_PROVIDER_SITE_OTHER): Payer: Self-pay | Admitting: Ophthalmology

## 2020-09-30 ENCOUNTER — Telehealth: Payer: Self-pay | Admitting: Family Medicine

## 2020-09-30 DIAGNOSIS — E113513 Type 2 diabetes mellitus with proliferative diabetic retinopathy with macular edema, bilateral: Secondary | ICD-10-CM

## 2020-09-30 MED ORDER — AFLIBERCEPT 2MG/0.05ML IZ SOLN FOR KALEIDOSCOPE
2.0000 mg | INTRAVITREAL | Status: AC | PRN
  Administered 2020-09-30: 2 mg via INTRAVITREAL

## 2020-09-30 MED ORDER — AFLIBERCEPT 2MG/0.05ML IZ SOLN FOR KALEIDOSCOPE
2.0000 mg | INTRAVITREAL | Status: AC | PRN
Start: 2020-09-30 — End: 2020-09-30
  Administered 2020-09-30: 2 mg via INTRAVITREAL

## 2020-09-30 NOTE — Telephone Encounter (Signed)
Please connect with patient He is due for a wellness checkup Needs lipid, met 7, PSA please schedule him for this spring tell him it is very important to get his lab work completed before that visit

## 2020-10-01 NOTE — Telephone Encounter (Signed)
Mailbox is full and can not take messages will try later this week

## 2020-10-03 ENCOUNTER — Other Ambulatory Visit: Payer: Self-pay | Admitting: Family Medicine

## 2020-10-03 NOTE — Telephone Encounter (Signed)
May have this and 1 rf needs ov this spring

## 2020-10-04 NOTE — Telephone Encounter (Signed)
Please schedule appt and then route back to send in refill

## 2020-10-10 ENCOUNTER — Encounter: Payer: Self-pay | Admitting: Internal Medicine

## 2020-10-10 ENCOUNTER — Ambulatory Visit (INDEPENDENT_AMBULATORY_CARE_PROVIDER_SITE_OTHER): Payer: Medicare Other | Admitting: Internal Medicine

## 2020-10-10 ENCOUNTER — Other Ambulatory Visit: Payer: Self-pay

## 2020-10-10 VITALS — BP 148/94 | HR 91 | Ht 71.0 in | Wt 180.2 lb

## 2020-10-10 DIAGNOSIS — E1165 Type 2 diabetes mellitus with hyperglycemia: Secondary | ICD-10-CM

## 2020-10-10 DIAGNOSIS — E1142 Type 2 diabetes mellitus with diabetic polyneuropathy: Secondary | ICD-10-CM

## 2020-10-10 DIAGNOSIS — E1122 Type 2 diabetes mellitus with diabetic chronic kidney disease: Secondary | ICD-10-CM | POA: Diagnosis not present

## 2020-10-10 DIAGNOSIS — E11319 Type 2 diabetes mellitus with unspecified diabetic retinopathy without macular edema: Secondary | ICD-10-CM

## 2020-10-10 DIAGNOSIS — Z992 Dependence on renal dialysis: Secondary | ICD-10-CM

## 2020-10-10 DIAGNOSIS — N186 End stage renal disease: Secondary | ICD-10-CM

## 2020-10-10 DIAGNOSIS — Z794 Long term (current) use of insulin: Secondary | ICD-10-CM

## 2020-10-10 LAB — POCT GLUCOSE (DEVICE FOR HOME USE): POC Glucose: 87 mg/dl (ref 70–99)

## 2020-10-10 LAB — POCT GLYCOSYLATED HEMOGLOBIN (HGB A1C): Hemoglobin A1C: 8.5 % — AB (ref 4.0–5.6)

## 2020-10-10 MED ORDER — ONETOUCH ULTRA VI STRP: 1.0000 | ORAL_STRIP | Freq: Four times a day (QID) | 3 refills | Status: DC

## 2020-10-10 MED ORDER — DEXCOM G6 TRANSMITTER MISC: 1.0000 | 3 refills | Status: DC

## 2020-10-10 MED ORDER — DEXCOM G6 SENSOR MISC: 1.0000 | 11 refills | Status: DC

## 2020-10-10 NOTE — Patient Instructions (Addendum)
-   Keep up the Good Work ! - Decrease Tresiba to 22 units at Bedtime - Continue Humalog 7 units with each meal       HOW TO TREAT LOW BLOOD SUGARS (Blood sugar LESS THAN 70 MG/DL)  Please follow the RULE OF 15 for the treatment of hypoglycemia treatment (when your (blood sugars are less than 70 mg/dL)    STEP 1: Take 15 grams of carbohydrates when your blood sugar is low, which includes:   3-4 GLUCOSE TABS  OR  3-4 OZ OF JUICE OR REGULAR SODA OR  ONE TUBE OF GLUCOSE GEL     STEP 2: RECHECK blood sugar in 15 MINUTES STEP 3: If your blood sugar is still low at the 15 minute recheck --> then, go back to STEP 1 and treat AGAIN with another 15 grams of carbohydrates. Decrease Tresiba to 24 units at bedtime Decrease Humalog to 7 units 3 times daily with meals,

## 2020-10-10 NOTE — Progress Notes (Signed)
Name: Randy Oneal  Age/ Sex: 55 y.o., male   MRN/ DOB: [id number], 11/06/1965     PCP: [name], MD   Reason for Endocrinology Evaluation: Type 2 Diabetes Mellitus  Initial Endocrine Consultative Visit: 10/20/2018    PATIENT IDENTIFIER: [name] is a 55 y.o. male with a past medical history of T2DM,HTN, and ESRD  . The patient has followed with Endocrinology clinic since  for consultative assistance with management of his diabetes.     DIABETIC HISTORY:  Mr. Wilhelmsen was diagnosed with DM in 2000.  He has been on Metformin, historically his control has been poor due to non-compliance but pt became motivated after his prolonged hospitalization for chest wall infection in [place], 2020.  He has been on insulin therapy for years. His hemoglobin A1c has ranged from 13.0%in 08/2018, peaking at16.8%in 2017   On his initial presentation to our clinic his A1c 13.0% and he was on Tresiba and Humalog.   SUBJECTIVE:   During the last visit (07/11/2020): A1c 11.0 %  we  adjusted MDI regimen    Today (10/10/2020): Randy Oneal is here for a follow up on diabetes. He is accompanied by his wife [name] . He checks his blood sugars 3 times daily, preprandial , he did not bring his meter today. The patient has not had hypoglycemic episodes since the last clinic visit   He is working towards getting on the transplant list and is motivated to control his diabetes.   Denies nausea or vomiting  Denies SOB   HOME DIABETES REGIMEN:  Tresiba 22 units daily - taking 24 units  Humalog 7 units with each meal     Statin: Yes ACE-I/ARB: on ESRD    METER DOWNLOAD SUMMARY: Did not bring    DIABETIC COMPLICATIONS: Microvascular complications:   Retinopathy, neuropathy, ESRD  Last Eye Exam: Completed 09/28/2020  Macrovascular complications:    Denies: CAD, CVA, PVD   HISTORY:  Past Medical History:  Past Medical History:  Diagnosis Date  . Anemia   . Diabetes mellitus     Type 2   . Diabetic retinopathy of both eyes ([place]) 01/31/2019   Dr Coralyn Pear 01/2019  . ESRD (end stage renal disease) ([place])    [place] Frenisus  . Herpes simplex virus (HSV) infection    OU  . Hypertension   . Retinal detachment    OS  . Sepsis due to Streptococcus, group B ([place]) 10/05/2018   Past Surgical History:  Past Surgical History:  Procedure Laterality Date  . [place] VITRECTOMY WITH 20 GAUGE MVR PORT FOR MACULAR HOLE Right 02/24/2019   Procedure: 25 GAUGE PARS PLANA VITRECTOMY WITH 20 GAUGE MVR PORT FOR MACULAR HOLE;  Surgeon: Bernarda Caffey, MD;  Location: [place];  Service: Ophthalmology;  Laterality: Right;  . APPLICATION OF WOUND VAC Left 08/27/2018   Procedure: APPLICATION OF WOUND VAC, left neck;  Surgeon: Gaye Pollack, MD;  Location: [place];  Service: Thoracic;  Laterality: Left;  . APPLICATION OF WOUND VAC Left 08/30/2018   Procedure: APPLICATION OF WOUND VAC;  Surgeon: Gaye Pollack, MD;  Location: [place];  Service: Thoracic;  Laterality: Left;  . AV FISTULA PLACEMENT Left 02/15/2020   Procedure: LEFT ARM ARTERIOVENOUS (AV) FISTULA CREATION;  Surgeon: Waynetta Sandy, MD;  Location: [place];  Service: Vascular;  Laterality: Left;  . CATARACT EXTRACTION Bilateral   . CATARACT EXTRACTION W/ INTRAOCULAR LENS  IMPLANT, BILATERAL    . COLONOSCOPY  06/24/2011   Procedure: COLONOSCOPY;  Surgeon: [name], MD;  [facility];  Service: Endoscopy;  Laterality: N/A;  8:30 AM  . EYE SURGERY    . GAS/FLUID EXCHANGE Left 03/31/2019   Procedure: Gas/Fluid Exchange;  Surgeon: Bernarda Caffey, MD;  Location: [place];  Service: Ophthalmology;  Laterality: Left;  . INJECTION OF SILICONE OIL  10/24/1759   Procedure: Injection Of Silicone Oil;  Surgeon: Bernarda Caffey, MD;  Location: [place];  Service: Ophthalmology;;  . INSERTION OF DIALYSIS CATHETER Right 12/05/2019   Procedure: INSERTION OF DIALYSIS CATHETER RIGHT SUBCLAVIAN;  Surgeon: Virl Cagey, MD;   [facility];  Service: General;  Laterality: Right;  . INSERTION OF DIALYSIS CATHETER Right 12/07/2019   Procedure: Minor Dialysis catheter in place- need to reposition/ adjust catheter to help with flows;  Surgeon: Virl Cagey, MD;  [facility];  Service: General;  Laterality: Right;  procedure room case with 1% lidocaine, full sterile drape,  towels, full gown, large chloraprep, 4-0 Monocryl and dermabond   . INSERTION OF DIALYSIS CATHETER Right 12/08/2019   Procedure: INSERTION OF DIALYSIS CATHETER EXCHANGE;  Surgeon: Virl Cagey, MD;  [facility];  Service: General;  Laterality: Right;  . MEMBRANE PEEL Right 02/24/2019   Procedure: Antoine Primas;  Surgeon: Bernarda Caffey, MD;  Location: [facility];  Service: Ophthalmology;  Laterality: Right;  . PARS PLANA VITRECTOMY Left 03/31/2019   Procedure: PARS PLANA VITRECTOMY WITH 25 GAUGE WITH MEMBRANE PEEL;  Surgeon: Bernarda Caffey, MD;  Location: [place];  Service: Ophthalmology;  Laterality: Left;  . PHOTOCOAGULATION WITH LASER Right 02/24/2019   Procedure: Photocoagulation With Laser;  Surgeon: Bernarda Caffey, MD;  Location: [facility];  Service: Ophthalmology;  Laterality: Right;  . PHOTOCOAGULATION WITH LASER Left 03/31/2019   Procedure: Photocoagulation With Laser;  Surgeon: Bernarda Caffey, MD;  Location: [facility];  Service: Ophthalmology;  Laterality: Left;  . RETINAL DETACHMENT SURGERY Left 03/31/2019   TRD Repair - Dr. Bernarda Caffey  . SILICON OIL REMOVAL Right 01/15/3709   Procedure: Silicon Oil Removal;  Surgeon: Bernarda Caffey, MD;  Location: [place];  Service: Ophthalmology;  Laterality: Right;  . STERNAL WOUND DEBRIDEMENT Left 08/27/2018   Procedure: Incision and DEBRIDEMENT Left Chest, Neck and Mediastinum;  Surgeon: Gaye Pollack, MD;  [facility];  Service: Thoracic;  Laterality: Left;  . STERNAL WOUND DEBRIDEMENT Left 08/30/2018   Procedure: WOUND VAC CHANGE, LEFT CHEST AND NECK, POSSIBLE DEBRIDEMENT;  Surgeon: Gaye Pollack,  MD;  Location: [place] [facility];  Service: Thoracic;  Laterality: Left;    Social History:  reports that he has never smoked. He has never used smokeless tobacco. He reports that he does not drink alcohol and does not use drugs. Family History:  Family History  Problem Relation Age of Onset  . Hypertension Mother   . Colon cancer Neg Hx      HOME MEDICATIONS: Allergies as of 10/10/2020      Reactions   Tramadol Nausea And Vomiting      Medication List       Accurate as of October 10, 2020  2:48 PM. If you have any questions, ask your nurse or doctor.        acetaminophen 500 MG tablet Commonly known as: TYLENOL Take 1,000 mg by mouth every 6 (six) hours as needed for moderate pain or headache.   amLODipine 2.5 MG tablet Commonly known as: NORVASC Take 1 tablet (2.5 mg total) by mouth daily.  atorvastatin 10 MG tablet Commonly known as: LIPITOR Take 1 tablet (10 mg total) by mouth daily.   brimonidine 0.2 % ophthalmic solution Commonly known as: ALPHAGAN Place 1 drop into both eyes daily.   calcium acetate 667 MG capsule Commonly known as: PHOSLO Take 1,334 mg by mouth 3 (three) times daily with meals.   Calcium Acetate 668 (169 Ca) MG Tabs TAKE 2 TABLETS BY MOUTH THREE TIMES DAILY WITH MEALS   cinacalcet 30 MG tablet Commonly known as: SENSIPAR Take by mouth.   Darbepoetin Alfa 60 MCG/0.3ML Sosy injection Commonly known as: ARANESP Inject 0.3 mLs (60 mcg total) into the vein every Monday with hemodialysis.   Dexcom G6 Sensor Misc 1 Device by Does not apply route as directed.   Dexcom G6 Transmitter Misc 1 Device by Does not apply route as directed.   famotidine 40 MG tablet Commonly known as: PEPCID Take 0.5 tablets (20 mg total) by mouth at bedtime.   insulin lispro 100 UNIT/ML KwikPen Commonly known as: HumaLOG KwikPen Inject 7 Units into the skin daily with breakfast.   Insulin Pen Needle 32G X 4 MM Misc 1 Device by Does not apply route in the morning, at  noon, in the evening, and at bedtime.   lidocaine-prilocaine cream Commonly known as: EMLA SMARTSIG:Sparingly Topical 3 Times a Week   metoprolol tartrate 25 MG tablet Commonly known as: LOPRESSOR Take 1 tablet (25 mg total) by mouth 2 (two) times daily.   Microlet Lancets Misc USE FOUR TIMES A DAY AS DIRECTED.   MIRCERA IJ Mircera   nystatin powder Commonly known as: MYCOSTATIN/NYSTOP Apply topically 2 (two) times daily. To groin and scrotum What changed:   how much to take  when to take this  reasons to take this   OneTouch Ultra test strip Generic drug: glucose blood USE TO TEST 4 TIMES DAILY.   pantoprazole 40 MG tablet Commonly known as: PROTONIX Take by mouth.   prednisoLONE acetate 1 % ophthalmic suspension Commonly known as: PRED FORTE Place 1 drop into both eyes 4 (four) times daily.   Prolensa 0.07 % Soln Generic drug: Bromfenac Sodium Place 1 drop into both eyes 4 (four) times daily.   [name] 100 UNIT/ML FlexTouch Pen Generic drug: insulin degludec Inject 22 Units into the skin at bedtime.   Velphoro 500 MG chewable tablet Generic drug: sucroferric oxyhydroxide Chew 500 mg by mouth 3 (three) times daily with meals.        OBJECTIVE:   Vital Signs: BP (!) 148/94   Pulse 91   Ht 5\' 11"  (1.803 m)   Wt 180 lb 4 oz (81.8 kg)   SpO2 98%   BMI 25.14 kg/m   Wt Readings from Last 3 Encounters:  10/10/20 180 lb 4 oz (81.8 kg)  07/11/20 180 lb 6 oz (81.8 kg)  03/30/20 176 lb (79.8 kg)     Exam: General: Pt appears well and is in NAD  Lungs: Clear with good BS bilat with no rales, rhonchi, or wheezes  Heart: RRR with normal S1 and S2 and no gallops; no murmurs; no rub  Abdomen: Normoactive bowel sounds, soft, nontender, without masses or organomegaly palpable  Extremities: No pretibial edema.   Neuro: MS is good with appropriate affect, pt is alert and Ox3    DM foot exam:   07/11/2020  The skin of the feet is without sores or  ulcerations, but pt with callous formation at the medial aspect of the right great toe  The pedal pulses are 2+ on right and 2+ on left. The sensation is decreased to a screening 5.07, 10 gram monofilament bilaterally        DATA REVIEWED:  Lab Results  Component Value Date   HGBA1C 11.0 (A) 07/11/2020   HGBA1C 8.8 (H) 11/30/2019   HGBA1C 7.7 (H) 02/15/2019   Lab Results  Component Value Date   MICROALBUR 3,744.2 (H) 11/30/2019   LDLCALC 67 11/30/2019   CREATININE 6.90 (H) 02/15/2020   Lab Results  Component Value Date   MICRALBCREAT 4,825 (H) 11/30/2019     Lab Results  Component Value Date   CHOL 138 11/30/2019   HDL 63 11/30/2019   LDLCALC 67 11/30/2019   TRIG 42 11/30/2019   CHOLHDL 2.2 11/30/2019         ASSESSMENT / PLAN / RECOMMENDATIONS:   1) Type 2 Diabetes Mellitus, With improving Glycemic control, With Retinopathic, neuropathic complications and ESRD  - Most recent A1c of 8.5  %. Goal A1c < 7.0 %.     - A1c down from 11.0 %  - I have praised him on the improved glycemic control  - His in-Office  BG is 87 mg/dL( fasting ) , since he is on a higher dose of tresiba then previously prescribed, will reduce to the original order of 22 units - Encouraged compliance, pt is motivated because there's a discussion about renal transplant.  - A prescription for the dexcom was sent to [facility]  - I am unable to  change his prandial insulin due to lack of glucose data during the day   MEDICATIONS: - Decrease Tresiba to 22  units at Bedtime - Continue   Humalog to 7 units with each meal     EDUCATION / INSTRUCTIONS:  BG monitoring instructions: Patient is instructed to check his blood sugars 3 times a day, before meals.  Call [facility] if: BG persistently < 70 . I reviewed the Rule of 15 for the treatment of hypoglycemia in detail with the patient. Literature supplied.    2) Diabetic complications:   Eye: Does  have known diabetic  retinopathy.   Neuro/ Feet: Does  have known diabetic peripheral neuropathy .   Renal: Patient does ave known baseline CKD. He   is not on an ACEI/ARB at present.     F/U in 4 months   Signed electronically by: Mack Guise, MD  [facility] Endocrinology  [facility] [address] [place], [place] [address] Phone: [phone] FAX: [phone]   CC: [name], [place] [address] Phone: [phone]  Fax: [phone]  Return to Endocrinology clinic as below: Future Appointments  Date Time Provider [place]  10/17/2020  2:15 PM Felipa Furnace, DPM [facility]  10/26/2020  3:00 PM Bernarda Caffey, MD [facility] None

## 2020-10-15 NOTE — Telephone Encounter (Signed)
LM to schedule appointment

## 2020-10-17 ENCOUNTER — Other Ambulatory Visit: Payer: Self-pay

## 2020-10-17 ENCOUNTER — Ambulatory Visit (INDEPENDENT_AMBULATORY_CARE_PROVIDER_SITE_OTHER): Payer: Medicare Other | Admitting: Podiatry

## 2020-10-17 DIAGNOSIS — E1142 Type 2 diabetes mellitus with diabetic polyneuropathy: Secondary | ICD-10-CM

## 2020-10-17 DIAGNOSIS — L84 Corns and callosities: Secondary | ICD-10-CM

## 2020-10-18 NOTE — Telephone Encounter (Signed)
Patient has appointment on 3/30 for medication follow up

## 2020-10-19 ENCOUNTER — Encounter: Payer: Self-pay | Admitting: Podiatry

## 2020-10-19 NOTE — Progress Notes (Signed)
Subjective:  Patient ID: Randy Oneal, male    DOB: 1966/03/19,  MRN: [id number]  Chief Complaint  Patient presents with  . Callouses    Right hallux callous     55 y.o. male presents for wound care.  Patient presents with a follow-up of history of right hallux IPJ ulceration that has now clinically healed.  Patient has a very hyperkeratotic lesion that needs to be debrided down.  He states is painful to walk on.  He is awaiting his orthotics to get picked up.  He also states he is got thickening  Dystrophic toenails x10 that he would like to have them debrided down he denies any other acute complaints.   Review of Systems: Negative except as noted in the HPI. Denies N/V/F/Ch.  Past Medical History:  Diagnosis Date  . Anemia   . Diabetes mellitus    Type 2   . Diabetic retinopathy of both eyes ([place]) 01/31/2019   Dr Coralyn Pear 01/2019  . ESRD (end stage renal disease) ([place])    [place] Frenisus  . Herpes simplex virus (HSV) infection    OU  . Hypertension   . Retinal detachment    OS  . Sepsis due to Streptococcus, group B ([place]) 10/05/2018    Current Outpatient Medications:  .  acetaminophen (TYLENOL) 500 MG tablet, Take 1,000 mg by mouth every 6 (six) hours as needed for moderate pain or headache., Disp: , Rfl:  .  amLODipine (NORVASC) 2.5 MG tablet, TAKE ONE TABLET BY MOUTH ONCE DAILY., Disp: 30 tablet, Rfl: 0 .  atorvastatin (LIPITOR) 10 MG tablet, Take 1 tablet (10 mg total) by mouth daily., Disp: 30 tablet, Rfl: 5 .  brimonidine (ALPHAGAN) 0.2 % ophthalmic solution, Place 1 drop into both eyes daily., Disp: 10 mL, Rfl: 10 .  Bromfenac Sodium (PROLENSA) 0.07 % SOLN, Place 1 drop into both eyes 4 (four) times daily., Disp: 6 mL, Rfl: 10 .  calcium acetate (PHOSLO) 667 MG capsule, Take 1,334 mg by mouth 3 (three) times daily with meals., Disp: , Rfl:  .  Calcium Acetate 668 (169 Ca) MG TABS, TAKE 2 TABLETS BY MOUTH THREE TIMES DAILY WITH MEALS, Disp: , Rfl:  .  cinacalcet  (SENSIPAR) 30 MG tablet, Take by mouth., Disp: , Rfl:  .  Continuous Blood Gluc Sensor (DEXCOM G6 SENSOR) MISC, 1 Device by Does not apply route as directed., Disp: 3 each, Rfl: 11 .  Continuous Blood Gluc Transmit (DEXCOM G6 TRANSMITTER) MISC, 1 Device by Does not apply route as directed., Disp: 1 each, Rfl: 3 .  Darbepoetin Alfa (ARANESP) 60 MCG/0.3ML SOSY injection, Inject 0.3 mLs (60 mcg total) into the vein every Monday with hemodialysis., Disp: , Rfl:  .  famotidine (PEPCID) 40 MG tablet, Take 0.5 tablets (20 mg total) by mouth at bedtime., Disp: 30 tablet, Rfl: 1 .  glucose blood (ONETOUCH ULTRA) test strip, 1 each by Other route in the morning, at noon, in the evening, and at bedtime. Use as instructed, Disp: 400 strip, Rfl: 3 .  insulin lispro (HUMALOG KWIKPEN) 100 UNIT/ML KwikPen, Inject 7 Units into the skin daily with breakfast., Disp: 30 mL, Rfl: 1 .  Insulin Pen Needle 32G X 4 MM MISC, 1 Device by Does not apply route in the morning, at noon, in the evening, and at bedtime., Disp: 400 each, Rfl: 1 .  lidocaine-prilocaine (EMLA) cream, SMARTSIG:Sparingly Topical 3 Times a Week, Disp: , Rfl:  .  Methoxy PEG-Epoetin Beta (MIRCERA IJ), Mircera, Disp: ,  Rfl:  .  metoprolol tartrate (LOPRESSOR) 25 MG tablet, Take 1 tablet (25 mg total) by mouth 2 (two) times daily., Disp: 60 tablet, Rfl: 1 .  MICROLET LANCETS MISC, USE FOUR TIMES A DAY AS DIRECTED., Disp: 100 each, Rfl: 0 .  nystatin (MYCOSTATIN/NYSTOP) powder, Apply topically 2 (two) times daily. To groin and scrotum (Patient taking differently: Apply 1 g topically 2 (two) times daily as needed (rash). To groin and scrotum), Disp: 15 g, Rfl: 0 .  pantoprazole (PROTONIX) 40 MG tablet, Take by mouth., Disp: , Rfl:  .  prednisoLONE acetate (PRED FORTE) 1 % ophthalmic suspension, Place 1 drop into both eyes 4 (four) times daily., Disp: 15 mL, Rfl: 3 .  sucroferric oxyhydroxide (VELPHORO) 500 MG chewable tablet, Chew 500 mg by mouth 3 (three)  times daily with meals., Disp: , Rfl:  .  TRESIBA FLEXTOUCH 100 UNIT/ML FlexTouch Pen, Inject 22 Units into the skin at bedtime., Disp: 30 mL, Rfl: 1  Social History   Tobacco Use  Smoking Status Never Smoker  Smokeless Tobacco Never Used    Allergies  Allergen Reactions  . Tramadol Nausea And Vomiting   Objective:  There were no vitals filed for this visit. There is no height or weight on file to calculate BMI. Constitutional Well developed. Well nourished.  Vascular Dorsalis pedis pulses palpable bilaterally. Posterior tibial pulses palpable bilaterally. Capillary refill normal to all digits.  No cyanosis or clubbing noted. Pedal hair growth normal.  Neurologic Normal speech. Oriented to person, place, and time. Protective sensation absent Nail Exam: Pt has thick disfigured discolored nails with subungual debris noted bilateral entire nail hallux through fifth toenails.  Pain on palpation to the nails.  Dermatologic  right hallux IPJ completely reepithelialized without any underlying ulceration.  No clinical signs of infection noted.  Patient does have hammertoe deformity to bilateral feet with primary deformity of the hallux on the right side.  Orthopedic: No pain to palpation either foot.   Radiographs: None Assessment:   1. Diabetic polyneuropathy associated with type 2 diabetes mellitus ([place])   2. Pre-ulcerative calluses    Plan:  Patient was evaluated and treated and all questions answered.  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  Given that patient is a high risk of developing wound I believe patient will benefit from diabetic shoes with insoles given that there is a hallux malleus contracture present leading to excessive pressure. -His diabetic shoes authorization has expired.  We will resubmit the paperwork  to obtain the diabetic shoes. -He is at high risk of opening open ulceration leading to a toe amputation versus losing his foot.  I discussed this with patient extensive detail.  Onychomycosis with pain  -Nails palliatively debrided as below. -Educated on self-care  Procedure: Nail Debridement Rationale: pain  Type of Debridement: manual, sharp debridement. Instrumentation: Nail nipper, rotary burr. Number of Nails: 10  Procedures and Treatment: Consent by patient was obtained for treatment procedures. The patient understood the discussion of treatment and procedures well. All questions were answered thoroughly reviewed. Debridement of mycotic and hypertrophic toenails, 1 through 5 bilateral and clearing of subungual debris. No ulceration, no infection noted.  Return Visit-Office Procedure: Patient instructed to return to the office for a follow up visit 3 months for continued evaluation and treatment.  Boneta Lucks, DPM    No follow-ups on file.  No follow-ups on file.

## 2020-10-22 ENCOUNTER — Other Ambulatory Visit: Payer: Self-pay

## 2020-10-22 ENCOUNTER — Ambulatory Visit (INDEPENDENT_AMBULATORY_CARE_PROVIDER_SITE_OTHER): Payer: Medicare Other | Admitting: Family Medicine

## 2020-10-22 ENCOUNTER — Encounter: Payer: Self-pay | Admitting: Family Medicine

## 2020-10-22 VITALS — BP 134/86 | HR 87 | Temp 98.8°F | Wt 186.4 lb

## 2020-10-22 DIAGNOSIS — E1140 Type 2 diabetes mellitus with diabetic neuropathy, unspecified: Secondary | ICD-10-CM

## 2020-10-22 DIAGNOSIS — Z1159 Encounter for screening for other viral diseases: Secondary | ICD-10-CM

## 2020-10-22 DIAGNOSIS — I10 Essential (primary) hypertension: Secondary | ICD-10-CM

## 2020-10-22 DIAGNOSIS — E119 Type 2 diabetes mellitus without complications: Secondary | ICD-10-CM | POA: Diagnosis not present

## 2020-10-22 DIAGNOSIS — E1169 Type 2 diabetes mellitus with other specified complication: Secondary | ICD-10-CM

## 2020-10-22 DIAGNOSIS — E785 Hyperlipidemia, unspecified: Secondary | ICD-10-CM

## 2020-10-22 NOTE — Progress Notes (Signed)
Subjective:    Patient ID: Randy Oneal, male    DOB: 11-04-1965, 55 y.o.   MRN: [id number]  Diabetes He presents for his follow-up diabetic visit. He has type 2 diabetes mellitus. There are no hypoglycemic associated symptoms. Pertinent negatives for hypoglycemia include no headaches. Associated symptoms include blurred vision and visual change. Pertinent negatives for diabetes include no chest pain, no fatigue and no weakness. (Pt states DM "did a number" on his vision. ) There are no hypoglycemic complications. There are no diabetic complications. Risk factors for coronary artery disease include hypertension and male sex. He is compliant with treatment all of the time. He is following a generally healthy diet. Exercise: every other day. He sees a podiatrist ([facility]).Eye exam is current.  Hypertension This is a chronic problem. Associated symptoms include blurred vision. Pertinent negatives include no chest pain, headaches or shortness of breath. Risk factors for coronary artery disease include diabetes mellitus and male gender. There are no compliance problems.    Essential hypertension - Plan: Lipid Profile, PSA, Hepatitis C Antibody  Type 2 diabetes mellitus not at goal [facility]) - Plan: Lipid Profile, PSA, Hepatitis C Antibody  Hyperlipidemia associated with type 2 diabetes mellitus ([place]) - Plan: Lipid Profile, PSA, Hepatitis C Antibody  Encounter for hepatitis C screening test for low risk patient - Plan: Lipid Profile, PSA, Hepatitis C Antibody     Review of Systems  Constitutional: Negative for activity change, fatigue and fever.  HENT: Negative for congestion and rhinorrhea.   Eyes: Positive for blurred vision.  Respiratory: Negative for cough and shortness of breath.   Cardiovascular: Negative for chest pain and leg swelling.  Gastrointestinal: Negative for abdominal pain, diarrhea and nausea.  Genitourinary: Negative for dysuria and hematuria.  Neurological: Negative for  weakness and headaches.  Psychiatric/Behavioral: Negative for agitation and behavioral problems.       Objective:   Physical Exam Vitals reviewed.  Constitutional:      General: He is not in acute distress. HENT:     Head: Normocephalic and atraumatic.  Eyes:     General:        Right eye: No discharge.        Left eye: No discharge.  Neck:     Trachea: No tracheal deviation.  Cardiovascular:     Rate and Rhythm: Normal rate and regular rhythm.     Heart sounds: Normal heart sounds. No murmur heard.   Pulmonary:     Effort: Pulmonary effort is normal. No respiratory distress.     Breath sounds: Normal breath sounds.  Lymphadenopathy:     Cervical: No cervical adenopathy.  Skin:    General: Skin is warm and dry.  Neurological:     Mental Status: He is alert.     Coordination: Coordination normal.  Psychiatric:        Behavior: Behavior normal.           Assessment & Plan:  1. Essential hypertension Blood pressure decent control continue current measures stay physically active Patient not always consistently taking his medications encouraged him strongly to do so - Lipid Profile - PSA - Hepatitis C Antibody  2. Type 2 diabetes mellitus not at goal [facility]) A1c has not been under good control.  He needs to continue follow-up with endocrinology and work hard to try to get A1c down to 7 - Lipid Profile - PSA - Hepatitis C Antibody  3. Hyperlipidemia associated with type 2 diabetes mellitus ([place]) Continue cholesterol  medicine check lipid profile await the results - Lipid Profile - PSA - Hepatitis C Antibody  4. Encounter for hepatitis C screening test for low risk patient Hep C screening - Lipid Profile - PSA - Hepatitis C Antibody

## 2020-10-23 ENCOUNTER — Encounter: Payer: Self-pay | Admitting: Family Medicine

## 2020-10-23 LAB — PSA: Prostate Specific Ag, Serum: 0.9 ng/mL (ref 0.0–4.0)

## 2020-10-23 LAB — LIPID PANEL
Chol/HDL Ratio: 3.3 ratio (ref 0.0–5.0)
Cholesterol, Total: 186 mg/dL (ref 100–199)
HDL: 57 mg/dL (ref >39)
LDL Chol Calc (NIH): 110 mg/dL — ABNORMAL HIGH (ref 0–99)
Triglycerides: 103 mg/dL (ref 0–149)
VLDL Cholesterol Cal: 19 mg/dL (ref 5–40)

## 2020-10-23 LAB — HEPATITIS C ANTIBODY: Hep C Virus Ab: <0.1 s/co ratio (ref 0.0–0.9)

## 2020-10-24 NOTE — Progress Notes (Addendum)
[facility] Note  10/26/2020     CHIEF COMPLAINT Patient presents for Retina Follow Up   HISTORY OF PRESENT ILLNESS: Randy Oneal is a 55 y.o. male who presents to the clinic today for:  HPI    Retina Follow Up    Patient presents with  Diabetic Retinopathy.  In both eyes.  This started years ago.  Severity is moderate.  Duration of 4 weeks.  Since onset it is stable.  I, the attending physician,  performed the HPI with the patient and updated documentation appropriately.          Comments    54 y/o male pt here for 4 wk f/u for PDR w/DME and TRD OU.  Feels VA OU may be slightly improved since last visit.  Denies pain, FOL, floaters.  PF and Prolensa QID OU.  BS 135 this a.m.  A1C 8.5.       Last edited by Bernarda Caffey, MD on 10/26/2020  5:40 PM. (History)    Pt states   Referring physician: [name], MD [address] [place],  [place] [address]  HISTORICAL INFORMATION:   Selected notes from the MEDICAL RECORD NUMBER Referred by Dr.Mark Gershon Crane for concern of decreased vision post cataract sx   CURRENT MEDICATIONS: Current Outpatient Medications (Ophthalmic Drugs)  Medication Sig  . Bromfenac Sodium (PROLENSA) 0.07 % SOLN Place 1 drop into both eyes 4 (four) times daily.  . prednisoLONE acetate (PRED FORTE) 1 % ophthalmic suspension Place 1 drop into both eyes 4 (four) times daily.  . brimonidine (ALPHAGAN) 0.2 % ophthalmic solution Place 1 drop into both eyes daily. (Patient not taking: Reported on 10/26/2020)   No current facility-administered medications for this visit. (Ophthalmic Drugs)   Current Outpatient Medications (Other)  Medication Sig  . acetaminophen (TYLENOL) 500 MG tablet Take 1,000 mg by mouth every 6 (six) hours as needed for moderate pain or headache.  Marland Kitchen amLODipine (NORVASC) 2.5 MG tablet TAKE ONE TABLET BY MOUTH ONCE DAILY.  Marland Kitchen atorvastatin (LIPITOR) 10 MG tablet Take 1 tablet (10 mg total) by mouth  daily.  . calcium acetate (PHOSLO) 667 MG capsule Take 1,334 mg by mouth 3 (three) times daily with meals.  . Calcium Acetate 668 (169 Ca) MG TABS TAKE 2 TABLETS BY MOUTH THREE TIMES DAILY WITH MEALS  . cinacalcet (SENSIPAR) 30 MG tablet Take by mouth.  . Continuous Blood Gluc Sensor (DEXCOM G6 SENSOR) MISC 1 Device by Does not apply route as directed.  . Continuous Blood Gluc Transmit (DEXCOM G6 TRANSMITTER) MISC 1 Device by Does not apply route as directed.  . Darbepoetin Alfa (ARANESP) 60 MCG/0.3ML SOSY injection Inject 0.3 mLs (60 mcg total) into the vein every Monday with hemodialysis.  . famotidine (PEPCID) 40 MG tablet Take 0.5 tablets (20 mg total) by mouth at bedtime.  Marland Kitchen glucose blood (ONETOUCH ULTRA) test strip 1 each by Other route in the morning, at noon, in the evening, and at bedtime. Use as instructed  . insulin lispro (HUMALOG KWIKPEN) 100 UNIT/ML KwikPen Inject 7 Units into the skin daily with breakfast.  . Insulin Pen Needle 32G X 4 MM MISC 1 Device by Does not apply route in the morning, at noon, in the evening, and at bedtime.  . lidocaine-prilocaine (EMLA) cream SMARTSIG:Sparingly Topical 3 Times a Week  . Methoxy PEG-Epoetin Beta (MIRCERA IJ) Mircera  . metoprolol tartrate (LOPRESSOR) 25 MG tablet Take 1 tablet (25 mg total) by  mouth 2 (two) times daily.  Marland Kitchen MICROLET LANCETS MISC USE FOUR TIMES A DAY AS DIRECTED.  Marland Kitchen nystatin (MYCOSTATIN/NYSTOP) powder Apply topically 2 (two) times daily. To groin and scrotum (Patient taking differently: Apply 1 g topically 2 (two) times daily as needed (rash). To groin and scrotum)  . pantoprazole (PROTONIX) 40 MG tablet Take by mouth.  . sucroferric oxyhydroxide (VELPHORO) 500 MG chewable tablet Chew 500 mg by mouth 3 (three) times daily with meals.  [name] 100 UNIT/ML FlexTouch Pen Inject 22 Units into the skin at bedtime.   No current facility-administered medications for this visit. (Other)      REVIEW OF SYSTEMS: ROS     Positive for: Genitourinary, Endocrine, Eyes   Negative for: Constitutional, Gastrointestinal, Neurological, Skin, Musculoskeletal, HENT, Cardiovascular, Respiratory, Psychiatric, Allergic/Imm, Heme/Lymph   Last edited by Matthew Folks, COA on 10/26/2020  3:24 PM. (History)       ALLERGIES Allergies  Allergen Reactions  . Tramadol Nausea And Vomiting    PAST MEDICAL HISTORY Past Medical History:  Diagnosis Date  . Anemia   . Diabetes mellitus    Type 2   . Diabetic retinopathy of both eyes ([place]) 01/31/2019   Dr Coralyn Pear 01/2019  . ESRD (end stage renal disease) ([place])    [place] Frenisus  . Herpes simplex virus (HSV) infection    OU  . Hypertension   . Hypertensive retinopathy    OU  . Retinal detachment    OS  . Sepsis due to Streptococcus, group B ([place]) 10/05/2018   Past Surgical History:  Procedure Laterality Date  . [place] VITRECTOMY WITH 20 GAUGE MVR PORT FOR MACULAR HOLE Right 02/24/2019   Procedure: 25 GAUGE PARS PLANA VITRECTOMY WITH 20 GAUGE MVR PORT FOR MACULAR HOLE;  Surgeon: Bernarda Caffey, MD;  Location: [facility];  Service: Ophthalmology;  Laterality: Right;  . APPLICATION OF WOUND VAC Left 08/27/2018   Procedure: APPLICATION OF WOUND VAC, left neck;  Surgeon: Gaye Pollack, MD;  Location: [facility];  Service: Thoracic;  Laterality: Left;  . APPLICATION OF WOUND VAC Left 08/30/2018   Procedure: APPLICATION OF WOUND VAC;  Surgeon: Gaye Pollack, MD;  Location: [facility];  Service: Thoracic;  Laterality: Left;  . AV FISTULA PLACEMENT Left 02/15/2020   Procedure: LEFT ARM ARTERIOVENOUS (AV) FISTULA CREATION;  Surgeon: Waynetta Sandy, MD;  Location: [facility];  Service: Vascular;  Laterality: Left;  . CATARACT EXTRACTION Bilateral   . CATARACT EXTRACTION W/ INTRAOCULAR LENS  IMPLANT, BILATERAL    . COLONOSCOPY  06/24/2011   Procedure: COLONOSCOPY;  Surgeon: [name], MD;  [facility];  Service: Endoscopy;  Laterality: N/A;  8:30 AM  .  EYE SURGERY    . GAS/FLUID EXCHANGE Left 03/31/2019   Procedure: Gas/Fluid Exchange;  Surgeon: Bernarda Caffey, MD;  Location: [facility];  Service: Ophthalmology;  Laterality: Left;  . INJECTION OF SILICONE OIL  9/45/0388   Procedure: Injection Of Silicone Oil;  Surgeon: Bernarda Caffey, MD;  Location: [facility];  Service: Ophthalmology;;  . INSERTION OF DIALYSIS CATHETER Right 12/05/2019   Procedure: INSERTION OF DIALYSIS CATHETER RIGHT SUBCLAVIAN;  Surgeon: Virl Cagey, MD;  Location: [facility];  Service: General;  Laterality: Right;  . INSERTION OF DIALYSIS CATHETER Right 12/07/2019   Procedure: Minor Dialysis catheter in place- need to reposition/ adjust catheter to help with flows;  Surgeon: Virl Cagey, MD;  Location: [facility];  Service: General;  Laterality: Right;  procedure room case with 1% lidocaine, full sterile drape,  towels, full gown, large chloraprep, 4-0 Monocryl and dermabond   . INSERTION OF DIALYSIS CATHETER Right 12/08/2019   Procedure: INSERTION OF DIALYSIS CATHETER EXCHANGE;  Surgeon: Virl Cagey, MD;  [facility]: [facility];  Service: General;  Laterality: Right;  . MEMBRANE PEEL Right 02/24/2019   Procedure: Antoine Primas;  Surgeon: Bernarda Caffey, MD;  Location: [facility];  Service: Ophthalmology;  Laterality: Right;  . PARS PLANA VITRECTOMY Left 03/31/2019   Procedure: PARS PLANA VITRECTOMY WITH 25 GAUGE WITH MEMBRANE PEEL;  Surgeon: Bernarda Caffey, MD;  Location: [facility];  Service: Ophthalmology;  Laterality: Left;  . PHOTOCOAGULATION WITH LASER Right 02/24/2019   Procedure: Photocoagulation With Laser;  Surgeon: Bernarda Caffey, MD;  Location: [facility];  Service: Ophthalmology;  Laterality: Right;  . PHOTOCOAGULATION WITH LASER Left 03/31/2019   Procedure: Photocoagulation With Laser;  Surgeon: Bernarda Caffey, MD;  Location: [facility];  Service: Ophthalmology;  Laterality: Left;  . RETINAL DETACHMENT SURGERY Left 03/31/2019   TRD Repair - Dr. Bernarda Caffey  . SILICON OIL REMOVAL Right 03/31/2019    Procedure: Silicon Oil Removal;  Surgeon: Bernarda Caffey, MD;  Location: [facility];  Service: Ophthalmology;  Laterality: Right;  . STERNAL WOUND DEBRIDEMENT Left 08/27/2018   Procedure: Incision and DEBRIDEMENT Left Chest, Neck and Mediastinum;  Surgeon: Gaye Pollack, MD;  Location: [facility];  Service: Thoracic;  Laterality: Left;  . STERNAL WOUND DEBRIDEMENT Left 08/30/2018   Procedure: WOUND VAC CHANGE, LEFT CHEST AND NECK, POSSIBLE DEBRIDEMENT;  Surgeon: Gaye Pollack, MD;  Location: [facility];  Service: Thoracic;  Laterality: Left;    FAMILY HISTORY Family History  Problem Relation Age of Onset  . Hypertension Mother   . Colon cancer Neg Hx     SOCIAL HISTORY Social History   Tobacco Use  . Smoking status: Never Smoker  . Smokeless tobacco: Never Used  Vaping Use  . Vaping Use: Never used  Substance Use Topics  . Alcohol use: No  . Drug use: No         OPHTHALMIC EXAM:  Base Eye Exam    Visual Acuity (Snellen - Linear)      Right Left   Dist [place] 20/250 20/250   Dist ph  20/150 -2 20/150 -2       Tonometry (Tonopen, 3:26 PM)      Right Left   Pressure 12 12       Pupils      Dark Light Shape React APD   Right 4 3 Irregular Minimal None   Left 3 2 Irregular Minimal None       Visual Fields (Counting fingers)      Left Right   Restrictions Partial outer superior temporal, inferior temporal, superior nasal, inferior nasal deficiencies Partial outer superior temporal, inferior temporal, superior nasal, inferior nasal deficiencies       Extraocular Movement      Right Left    Full, Ortho Full, Ortho       Neuro/Psych    Oriented x3: Yes   Mood/Affect: Normal       Dilation    Both eyes: 1.0% Mydriacyl, 2.5% Phenylephrine @ 3:26 PM        Slit Lamp and Fundus Exam    Slit Lamp Exam      Right Left   Lids/Lashes mild Meibomian gland dysfunction mild Meibomian gland dysfunction   Conjunctiva/Sclera subconj silicon oil bubbles, Chemosis, conj Cyst White  and  quiet   Cornea Trace PEE, Well healed cataract wound, arcus, Debris in tear film Trace PEE, Well healed cataract wound, arcus, Debris in tear film   Anterior Chamber Deep and quiet, no cell or flare Deep and quiet, no cell or flare   Iris slightly irregular dilation, posterior synchiae from 3:00-1200 round, focal Posterior synechiae at 1030   Lens PC IOL in good position, pigment deposition, 1+ PCO, mild pigment deposition on optic PC IOL in good position, mild pigment deposition   Vitreous Post vit, silicon oil bubble ~57% post vitrectomy, good silicone oil fill       Fundus Exam      Right Left   Disc 2+pallor, sharp rim, mild fibrosis along ST rim +pallor, sharp rim, fine NVD, mild fibrosis   C/D Ratio 0.2 0.2   Macula attached under oil, scattered MA/IRH, nasal and central thickening / edema -- slightly improved attached under oil, +fibrosis/edema -- slightly improved, scattered IRH, focal bands of PRF with traction emanating from ST macula, cluster of DBH inferior mac   Vessels attenuated, Tortuous Attenuated, dilated venules, Tortuous, early NVE IT arcades   Periphery Attached, dense 360 PRP laser, mild scattered fibrosis - persistent, Focal ret hole along distal IT arcades--good laser surrounding Attached, good 360 PRP laser changes, pre-retinal fibrosis extending to posterior PRP border superior and inferiorly          IMAGING AND PROCEDURES  Imaging and Procedures for _0 @  OCT, Retina - OU - Both Eyes       Right Eye Quality was good. Central Foveal Thickness: 742. Progression has improved. Findings include preretinal fibrosis, abnormal foveal contour, outer retinal atrophy, epiretinal membrane, intraretinal fluid, no SRF (Mild Interval improvement in central edema).   Left Eye Quality was good. Central Foveal Thickness: 590. Progression has improved. Findings include preretinal fibrosis, intraretinal fluid, abnormal foveal contour, intraretinal hyper-reflective  material, epiretinal membrane, no SRF (interval improvement in IRF).   Notes *Images captured and stored on drive  Diagnosis / Impression:  +DME OU Mild interval improvement in IRF/edema OU under silicon oil   Clinical management:  See below  Abbreviations: NFP - Normal foveal profile. CME - cystoid macular edema. PED - pigment epithelial detachment. IRF - intraretinal fluid. SRF - subretinal fluid. EZ - ellipsoid zone. ERM - epiretinal membrane. ORA - outer retinal atrophy. ORT - outer retinal tubulation. SRHM - subretinal hyper-reflective material        Intravitreal Injection, Pharmacologic Agent - OD - Right Eye       Time Out 10/26/2020. 3:37 PM. Confirmed correct patient, procedure, site, and patient consented.   Anesthesia Topical anesthesia was used. Anesthetic medications included Lidocaine 2%, Proparacaine 0.5%.   Procedure Preparation included 5% betadine to ocular surface, eyelid speculum. A (32 g) needle was used.   Injection:  2 mg aflibercept Alfonse Flavors) SOLN   NDC: A3590391, Lot: [id number], Expiration date: 05/10/2021   Route: Intravitreal, Site: Right Eye, Waste: 0.05 mL  Post-op Post injection exam found visual acuity of at least counting fingers. The patient tolerated the procedure well. There were no complications. The patient received written and verbal post procedure care education.        Intravitreal Injection, Pharmacologic Agent - OS - Left Eye       Time Out 10/26/2020. 3:37 PM. Confirmed correct patient, procedure, site, and patient consented.   Anesthesia Topical anesthesia was used. Anesthetic medications included Lidocaine 2%, Proparacaine 0.5%.   Procedure Preparation included 5% betadine to ocular surface, eyelid  speculum. A (32 g) needle was used.   Injection:  2 mg aflibercept Alfonse Flavors) SOLN   NDC: M7179715, Lot: [id number], Expiration date: 07/10/2021   Route: Intravitreal, Site: Left Eye, Waste: 0.05 mL  Post-op Post  injection exam found visual acuity of at least counting fingers. The patient tolerated the procedure well. There were no complications. The patient received written and verbal post procedure care education.                 ASSESSMENT/PLAN:    ICD-10-CM   1. Proliferative diabetic retinopathy of both eyes with macular edema associated with type 2 diabetes mellitus (HCC)  B71.6967 Intravitreal Injection, Pharmacologic Agent - OD - Right Eye    Intravitreal Injection, Pharmacologic Agent - OS - Left Eye    aflibercept (EYLEA) SOLN 2 mg    aflibercept (EYLEA) SOLN 2 mg  2. 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 Vitreous hemorrhage of both eyes ([place])  H43.13   4. Retinal edema  H35.81 OCT, Retina - OU - Both Eyes  5. Essential hypertension  I10   6. Hypertensive retinopathy of both eyes  H35.033   7. Pseudophakia of both eyes  Z96.1   8. CME (cystoid macular edema), bilateral  H35.353    1-4. Proliferative diabetic retinopathy with DME, TRD, and vitreous hemorrhage OU (OD > OS)  - last A1c: 11.0 on 12.01.21  - lost to f/u from Jan 2021 to Oct 2021 due to loss of insurance coverage  - pt with complex medical history with hospitalization in Jan-Feb 2020 for bacteremia and abscess  - history of poor glycemic control for years  - s/p IVA OD #1 (06.20.20), #2 (07.01.20), #3 (09.11.20), #4 (10.09.20), #5 (11.06.20), #6 (11.24.21), #7 (12.22.21), #8 (01.19.22)  - s/p IVA OS #1 (06.20.20), #2 (07.01.20), #3 (08.13.20), #4 (09.11.20)  #5 (11.06.20), #6 (11.24.21), #7 (12.22.21), #8 (01.19.22)  - s/p IVE OU #1 (12.04.20) - sample; #2 (01.06.21), #3 (02.18.22)  - s/p STK OS #1 (10.09.20)  - s/p PRP OS (06.10.20)  - s/p PPV/PFC/EL/FAX/silicone oil OD, 89.38.10  - s/p subconj silicon oil removal OD 8.20.20  - s/p PPV/EL/FAX/silicone oil OS, 17.51.02  - BCVA 20/150 OU (stable OU)              - OCT: interval improvement in IRF/edema OU  - recommend IVE OU #4 for DME today,  03.18.22  - RBA of procedure discussed, questions answered  - informed consent obtained and signed  - see procedure note             - Eylea benefits investigation begun 01.19.22 -- approved  - discussed possible need for PPV w/ membrane peel and silicon oil exchange OS due to worsening fibrosis  - cont topical PF and Prolensa QID OU for possible CME component  - f/u 4 weeks -- DFE/OCT, possible injection  5,6. Hypertensive retinopathy OU  - discussed importance of tight BP control  - monitor  7. Pseudophakia OU  - s/p CE with IOL Gershon Crane)  Ophthalmic Meds Ordered this visit:  Meds ordered this encounter  Medications  . aflibercept (EYLEA) SOLN 2 mg  . aflibercept (EYLEA) SOLN 2 mg      Return in about 4 weeks (around 11/23/2020) for f/u PDR OU, DFE, OCT.  There are no Patient Instructions on file for this visit.  This document serves as a record of services personally performed by Gardiner Sleeper, MD, PhD. It was created on their behalf by  San Jetty. Owens Shark, OA an ophthalmic technician. The creation of this record is the provider's dictation and/or activities during the visit.    Electronically signed by: San Jetty. Marguerita Merles 03.16.2022 5:47 PM  Gardiner Sleeper, M.D., Ph.D. Diseases & Surgery of the Retina and Vitreous [facility]  I have reviewed the above documentation for accuracy and completeness, and I agree with the above. Gardiner Sleeper, M.D., Ph.D. 10/26/20 5:47 PM   Abbreviations: M myopia (nearsighted); A astigmatism; H hyperopia (farsighted); P presbyopia; Mrx spectacle prescription;  CTL contact lenses; OD right eye; OS left eye; OU both eyes  XT exotropia; ET esotropia; PEK punctate epithelial keratitis; PEE punctate epithelial erosions; DES dry eye syndrome; MGD meibomian gland dysfunction; ATs artificial tears; PFAT's preservative free artificial tears; [place] nuclear sclerotic cataract; PSC posterior subcapsular cataract; ERM epi-retinal  membrane; PVD posterior vitreous detachment; RD retinal detachment; DM diabetes mellitus; DR diabetic retinopathy; NPDR non-proliferative diabetic retinopathy; PDR proliferative diabetic retinopathy; CSME clinically significant macular edema; DME diabetic macular edema; dbh dot blot hemorrhages; CWS cotton wool spot; POAG primary open angle glaucoma; C/D cup-to-disc ratio; HVF humphrey visual field; GVF goldmann visual field; OCT optical coherence tomography; IOP intraocular pressure; BRVO Branch retinal vein occlusion; CRVO central retinal vein occlusion; CRAO central retinal artery occlusion; BRAO branch retinal artery occlusion; RT retinal tear; SB scleral buckle; PPV pars plana vitrectomy; VH Vitreous hemorrhage; PRP panretinal laser photocoagulation; IVK intravitreal kenalog; VMT vitreomacular traction; MH Macular hole;  NVD neovascularization of the disc; NVE neovascularization elsewhere; AREDS age related eye disease study; ARMD age related macular degeneration; POAG primary open angle glaucoma; EBMD epithelial/anterior basement membrane dystrophy; ACIOL anterior chamber intraocular lens; IOL intraocular lens; PCIOL posterior chamber intraocular lens; Phaco/IOL phacoemulsification with intraocular lens placement; [place] photorefractive keratectomy; LASIK laser assisted in situ keratomileusis; HTN hypertension; DM diabetes mellitus; COPD chronic obstructive pulmonary disease

## 2020-10-26 ENCOUNTER — Encounter (INDEPENDENT_AMBULATORY_CARE_PROVIDER_SITE_OTHER): Payer: Self-pay | Admitting: Ophthalmology

## 2020-10-26 ENCOUNTER — Ambulatory Visit (INDEPENDENT_AMBULATORY_CARE_PROVIDER_SITE_OTHER): Payer: Medicare Other | Admitting: Ophthalmology

## 2020-10-26 ENCOUNTER — Other Ambulatory Visit: Payer: Self-pay

## 2020-10-26 DIAGNOSIS — H3343 Traction detachment of retina, bilateral: Secondary | ICD-10-CM

## 2020-10-26 DIAGNOSIS — H35352 Cystoid macular degeneration, left eye: Secondary | ICD-10-CM

## 2020-10-26 DIAGNOSIS — H35033 Hypertensive retinopathy, bilateral: Secondary | ICD-10-CM

## 2020-10-26 DIAGNOSIS — H4921 Sixth [abducent] nerve palsy, right eye: Secondary | ICD-10-CM

## 2020-10-26 DIAGNOSIS — I10 Essential (primary) hypertension: Secondary | ICD-10-CM

## 2020-10-26 DIAGNOSIS — H3581 Retinal edema: Secondary | ICD-10-CM

## 2020-10-26 DIAGNOSIS — Z961 Presence of intraocular lens: Secondary | ICD-10-CM

## 2020-10-26 DIAGNOSIS — H35353 Cystoid macular degeneration, bilateral: Secondary | ICD-10-CM

## 2020-10-26 DIAGNOSIS — E113513 Type 2 diabetes mellitus with proliferative diabetic retinopathy with macular edema, bilateral: Secondary | ICD-10-CM

## 2020-10-26 DIAGNOSIS — H4313 Vitreous hemorrhage, bilateral: Secondary | ICD-10-CM

## 2020-10-26 MED ORDER — AFLIBERCEPT 2MG/0.05ML IZ SOLN FOR KALEIDOSCOPE
2.0000 mg | INTRAVITREAL | Status: AC | PRN
Start: 2020-10-26 — End: 2020-10-26
  Administered 2020-10-26: 2 mg via INTRAVITREAL

## 2020-10-26 MED ORDER — AFLIBERCEPT 2MG/0.05ML IZ SOLN FOR KALEIDOSCOPE
2.0000 mg | INTRAVITREAL | Status: AC | PRN
  Administered 2020-10-26: 2 mg via INTRAVITREAL

## 2020-11-07 ENCOUNTER — Ambulatory Visit: Payer: Medicare Other | Admitting: Family Medicine

## 2020-11-22 NOTE — Progress Notes (Signed)
[facility] Note  11/26/2020     CHIEF COMPLAINT Patient presents for Retina Follow Up   HISTORY OF PRESENT ILLNESS: Randy Oneal is a 55 y.o. male who presents to the clinic today for:  HPI    Retina Follow Up    Patient presents with  Diabetic Retinopathy.  In both eyes.  This started 4 weeks ago.  I, the attending physician,  performed the HPI with the patient and updated documentation appropriately.          Comments    Patient here for 4 weeks retina follow up for PDR OU. Patient states vision about the same. May be a little bit better. No eye pain.        Last edited by Bernarda Caffey, MD on 11/26/2020  3:39 PM. (History)    Pt states he is doing well, no change in vision  Referring physician: [name], MD [address] [place],  [place] [address]  HISTORICAL INFORMATION:   Selected notes from the MEDICAL RECORD NUMBER Referred by Dr.Mark Gershon Crane for concern of decreased vision post cataract sx   CURRENT MEDICATIONS: Current Outpatient Medications (Ophthalmic Drugs)  Medication Sig  . brimonidine (ALPHAGAN) 0.2 % ophthalmic solution Place 1 drop into both eyes daily. (Patient not taking: Reported on 10/26/2020)  . Bromfenac Sodium (PROLENSA) 0.07 % SOLN Place 1 drop into both eyes 4 (four) times daily.  . prednisoLONE acetate (PRED FORTE) 1 % ophthalmic suspension Place 1 drop into both eyes 4 (four) times daily.   No current facility-administered medications for this visit. (Ophthalmic Drugs)   Current Outpatient Medications (Other)  Medication Sig  . acetaminophen (TYLENOL) 500 MG tablet Take 1,000 mg by mouth every 6 (six) hours as needed for moderate pain or headache.  Marland Kitchen amLODipine (NORVASC) 2.5 MG tablet TAKE ONE TABLET BY MOUTH ONCE DAILY.  Marland Kitchen atorvastatin (LIPITOR) 10 MG tablet Take 1 tablet (10 mg total) by mouth daily.  . calcium acetate (PHOSLO) 667 MG capsule Take 1,334 mg by mouth 3 (three) times daily with  meals.  . Calcium Acetate 668 (169 Ca) MG TABS TAKE 2 TABLETS BY MOUTH THREE TIMES DAILY WITH MEALS  . cinacalcet (SENSIPAR) 30 MG tablet Take by mouth.  . Continuous Blood Gluc Sensor (DEXCOM G6 SENSOR) MISC 1 Device by Does not apply route as directed.  . Continuous Blood Gluc Transmit (DEXCOM G6 TRANSMITTER) MISC 1 Device by Does not apply route as directed.  . Darbepoetin Alfa (ARANESP) 60 MCG/0.3ML SOSY injection Inject 0.3 mLs (60 mcg total) into the vein every Monday with hemodialysis.  . famotidine (PEPCID) 40 MG tablet Take 0.5 tablets (20 mg total) by mouth at bedtime.  Marland Kitchen glucose blood (ONETOUCH ULTRA) test strip 1 each by Other route in the morning, at noon, in the evening, and at bedtime. Use as instructed  . insulin lispro (HUMALOG KWIKPEN) 100 UNIT/ML KwikPen Inject 7 Units into the skin daily with breakfast.  . Insulin Pen Needle 32G X 4 MM MISC 1 Device by Does not apply route in the morning, at noon, in the evening, and at bedtime.  . lidocaine-prilocaine (EMLA) cream SMARTSIG:Sparingly Topical 3 Times a Week  . Methoxy PEG-Epoetin Beta (MIRCERA IJ) Mircera  . metoprolol tartrate (LOPRESSOR) 25 MG tablet Take 1 tablet (25 mg total) by mouth 2 (two) times daily.  Marland Kitchen MICROLET LANCETS MISC USE FOUR TIMES A DAY AS DIRECTED.  Marland Kitchen nystatin (MYCOSTATIN/NYSTOP) powder Apply topically 2 (  two) times daily. To groin and scrotum (Patient taking differently: Apply 1 g topically 2 (two) times daily as needed (rash). To groin and scrotum)  . pantoprazole (PROTONIX) 40 MG tablet Take by mouth.  . sucroferric oxyhydroxide (VELPHORO) 500 MG chewable tablet Chew 500 mg by mouth 3 (three) times daily with meals.  [name] 100 UNIT/ML FlexTouch Pen Inject 22 Units into the skin at bedtime.   No current facility-administered medications for this visit. (Other)      REVIEW OF SYSTEMS: ROS    Positive for: Genitourinary, Endocrine, Eyes   Negative for: Constitutional, Gastrointestinal,  Neurological, Skin, Musculoskeletal, HENT, Cardiovascular, Respiratory, Psychiatric, Allergic/Imm, Heme/Lymph   Last edited by Theodore Demark, COA on 11/26/2020  9:58 AM. (History)       ALLERGIES Allergies  Allergen Reactions  . Tramadol Nausea And Vomiting    PAST MEDICAL HISTORY Past Medical History:  Diagnosis Date  . Anemia   . Diabetes mellitus    Type 2   . Diabetic retinopathy of both eyes ([place]) 01/31/2019   Dr Coralyn Pear 01/2019  . ESRD (end stage renal disease) ([place])    [place] Frenisus  . Herpes simplex virus (HSV) infection    OU  . Hypertension   . Hypertensive retinopathy    OU  . Retinal detachment    OS  . Sepsis due to Streptococcus, group B ([place]) 10/05/2018   Past Surgical History:  Procedure Laterality Date  . [place] VITRECTOMY WITH 20 GAUGE MVR PORT FOR MACULAR HOLE Right 02/24/2019   Procedure: 25 GAUGE PARS PLANA VITRECTOMY WITH 20 GAUGE MVR PORT FOR MACULAR HOLE;  Surgeon: Bernarda Caffey, MD;  Location: [facility];  Service: Ophthalmology;  Laterality: Right;  . APPLICATION OF WOUND VAC Left 08/27/2018   Procedure: APPLICATION OF WOUND VAC, left neck;  Surgeon: Gaye Pollack, MD;  Location: [facility];  Service: Thoracic;  Laterality: Left;  . APPLICATION OF WOUND VAC Left 08/30/2018   Procedure: APPLICATION OF WOUND VAC;  Surgeon: Gaye Pollack, MD;  Location: [facility];  Service: Thoracic;  Laterality: Left;  . AV FISTULA PLACEMENT Left 02/15/2020   Procedure: LEFT ARM ARTERIOVENOUS (AV) FISTULA CREATION;  Surgeon: Waynetta Sandy, MD;  Location: [facility];  Service: Vascular;  Laterality: Left;  . CATARACT EXTRACTION Bilateral   . CATARACT EXTRACTION W/ INTRAOCULAR LENS  IMPLANT, BILATERAL    . COLONOSCOPY  06/24/2011   Procedure: COLONOSCOPY;  Surgeon: [name], MD;  [facility];  Service: Endoscopy;  Laterality: N/A;  8:30 AM  . EYE SURGERY    . GAS/FLUID EXCHANGE Left 03/31/2019   Procedure: Gas/Fluid Exchange;  Surgeon:  Bernarda Caffey, MD;  Location: [facility];  Service: Ophthalmology;  Laterality: Left;  . INJECTION OF SILICONE OIL  8/41/3244   Procedure: Injection Of Silicone Oil;  Surgeon: Bernarda Caffey, MD;  Location: [facility];  Service: Ophthalmology;;  . INSERTION OF DIALYSIS CATHETER Right 12/05/2019   Procedure: INSERTION OF DIALYSIS CATHETER RIGHT SUBCLAVIAN;  Surgeon: Virl Cagey, MD;  Location: [facility];  Service: General;  Laterality: Right;  . INSERTION OF DIALYSIS CATHETER Right 12/07/2019   Procedure: Minor Dialysis catheter in place- need to reposition/ adjust catheter to help with flows;  Surgeon: Virl Cagey, MD;  Location: [facility];  Service: General;  Laterality: Right;  procedure room case with 1% lidocaine, full sterile drape,  towels, full gown, large chloraprep, 4-0 Monocryl and dermabond   . INSERTION OF DIALYSIS  CATHETER Right 12/08/2019   Procedure: INSERTION OF DIALYSIS CATHETER EXCHANGE;  Surgeon: Virl Cagey, MD;  Location: [facility];  Service: General;  Laterality: Right;  . MEMBRANE PEEL Right 02/24/2019   Procedure: Antoine Primas;  Surgeon: Bernarda Caffey, MD;  Location: [facility];  Service: Ophthalmology;  Laterality: Right;  . PARS PLANA VITRECTOMY Left 03/31/2019   Procedure: PARS PLANA VITRECTOMY WITH 25 GAUGE WITH MEMBRANE PEEL;  Surgeon: Bernarda Caffey, MD;  Location: [facility];  Service: Ophthalmology;  Laterality: Left;  . PHOTOCOAGULATION WITH LASER Right 02/24/2019   Procedure: Photocoagulation With Laser;  Surgeon: Bernarda Caffey, MD;  Location: [facility];  Service: Ophthalmology;  Laterality: Right;  . PHOTOCOAGULATION WITH LASER Left 03/31/2019   Procedure: Photocoagulation With Laser;  Surgeon: Bernarda Caffey, MD;  Location: [facility];  Service: Ophthalmology;  Laterality: Left;  . RETINAL DETACHMENT SURGERY Left 03/31/2019   TRD Repair - Dr. Bernarda Caffey  . SILICON OIL REMOVAL Right 7/85/8850   Procedure: Silicon Oil Removal;  Surgeon: Bernarda Caffey, MD;  Location: [facility];  Service:  Ophthalmology;  Laterality: Right;  . STERNAL WOUND DEBRIDEMENT Left 08/27/2018   Procedure: Incision and DEBRIDEMENT Left Chest, Neck and Mediastinum;  Surgeon: Gaye Pollack, MD;  Location: [facility];  Service: Thoracic;  Laterality: Left;  . STERNAL WOUND DEBRIDEMENT Left 08/30/2018   Procedure: WOUND VAC CHANGE, LEFT CHEST AND NECK, POSSIBLE DEBRIDEMENT;  Surgeon: Gaye Pollack, MD;  Location: [facility];  Service: Thoracic;  Laterality: Left;    FAMILY HISTORY Family History  Problem Relation Age of Onset  . Hypertension Mother   . Colon cancer Neg Hx     SOCIAL HISTORY Social History   Tobacco Use  . Smoking status: Never Smoker  . Smokeless tobacco: Never Used  Vaping Use  . Vaping Use: Never used  Substance Use Topics  . Alcohol use: No  . Drug use: No         OPHTHALMIC EXAM:  Base Eye Exam    Visual Acuity (Snellen - Linear)      Right Left   Dist [place] 20/250 -2 20/300   Dist ph [place] 20/150 -2 20/150 -2       Tonometry (Tonopen, 9:54 AM)      Right Left   Pressure 17 19       Pupils      Dark Light Shape React APD   Right 4 3 Irregular Minimal None   Left 3 2 Irregular Minimal None       Visual Fields      Left Right   Restrictions Partial outer superior temporal, inferior temporal, superior nasal, inferior nasal deficiencies Partial outer superior temporal, inferior temporal, superior nasal, inferior nasal deficiencies       Extraocular Movement      Right Left    Full, Ortho Full, Ortho       Neuro/Psych    Oriented x3: Yes   Mood/Affect: Normal       Dilation    Both eyes: 1.0% Mydriacyl, 2.5% Phenylephrine @ 9:54 AM        Slit Lamp and Fundus Exam    Slit Lamp Exam      Right Left   Lids/Lashes mild Meibomian gland dysfunction mild Meibomian gland dysfunction   Conjunctiva/Sclera subconj silicon oil bubbles, Chemosis, conj Cyst White and quiet   Cornea Trace PEE, Well healed cataract wound, arcus, Debris in tear film Trace PEE, Well  healed cataract wound, arcus, Debris in tear film  Anterior Chamber Deep and quiet, no cell or flare Deep and quiet, no cell or flare   Iris slightly irregular dilation, posterior synchiae from 3:00-1200 round, focal Posterior synechiae at 1030   Lens PC IOL in good position, pigment deposition, 1+ PCO, mild pigment deposition on optic PC IOL in good position, mild pigment deposition   Vitreous Post vit, silicon oil bubble ~55% post vitrectomy, good silicone oil fill       Fundus Exam      Right Left   Disc 2+pallor, sharp rim, mild fibrosis along ST rim +pallor, sharp rim, fine NVD, mild fibrosis   C/D Ratio 0.2 0.2   Macula attached under oil, scattered MA/IRH, nasal and central thickening / edema -- persistent with minimal improvement attached under oil, +fibrosis/edema -- persistent scattered IRH, focal bands of PRF with traction emanating from ST macula, cluster of DBH inferior mac -- improved   Vessels attenuated, Tortuous Attenuated, dilated venules, Tortuous, early NVE IT arcades   Periphery Attached, dense 360 PRP laser, mild scattered fibrosis - persistent, Focal ret hole along distal IT arcades--good laser surrounding Attached, good 360 PRP laser changes, pre-retinal fibrosis extending to posterior PRP border superior and inferiorly          IMAGING AND PROCEDURES  Imaging and Procedures for _0 @  OCT, Retina - OU - Both Eyes       Right Eye Quality was good. Central Foveal Thickness: 790. Progression has worsened. Findings include preretinal fibrosis, abnormal foveal contour, outer retinal atrophy, epiretinal membrane, intraretinal fluid, no SRF (Mild Interval increase in IRF).   Left Eye Quality was good. Central Foveal Thickness: 620. Progression has worsened. Findings include preretinal fibrosis, intraretinal fluid, abnormal foveal contour, intraretinal hyper-reflective material, epiretinal membrane, no SRF (Mild Interval increase in IRF).   Notes *Images captured  and stored on drive  Diagnosis / Impression:  +DME OU Mild Interval increase in IRF OU   Clinical management:  See below  Abbreviations: NFP - Normal foveal profile. CME - cystoid macular edema. PED - pigment epithelial detachment. IRF - intraretinal fluid. SRF - subretinal fluid. EZ - ellipsoid zone. ERM - epiretinal membrane. ORA - outer retinal atrophy. ORT - outer retinal tubulation. SRHM - subretinal hyper-reflective material        Intravitreal Injection, Pharmacologic Agent - OD - Right Eye       Time Out 11/26/2020. 10:13 AM. Confirmed correct patient, procedure, site, and patient consented.   Anesthesia Topical anesthesia was used. Anesthetic medications included Lidocaine 2%, Proparacaine 0.5%.   Procedure Preparation included 5% betadine to ocular surface, eyelid speculum. A (32 g) needle was used.   Injection:  2 mg aflibercept Alfonse Flavors) SOLN   NDC: A3590391, Lot: [id number], Expiration date: 07/11/2021   Route: Intravitreal, Site: Right Eye, Waste: 0.05 mL  Post-op Post injection exam found visual acuity of at least counting fingers. The patient tolerated the procedure well. There were no complications. The patient received written and verbal post procedure care education.        Intravitreal Injection, Pharmacologic Agent - OS - Left Eye       Time Out 11/26/2020. 10:13 AM. Confirmed correct patient, procedure, site, and patient consented.   Anesthesia Topical anesthesia was used. Anesthetic medications included Lidocaine 2%, Proparacaine 0.5%.   Procedure Preparation included 5% betadine to ocular surface, eyelid speculum. A (32 g) needle was used.   Injection:  2 mg aflibercept Alfonse Flavors) SOLN   NDC: M7179715, Lot: [id number], Expiration date: 06/11/2021   Route:  Intravitreal, Site: Left Eye, Waste: 0.05 mL  Post-op Post injection exam found visual acuity of at least counting fingers. The patient tolerated the procedure well. There were no  complications. The patient received written and verbal post procedure care education.                 ASSESSMENT/PLAN:    ICD-10-CM   1. Proliferative diabetic retinopathy of both eyes with macular edema associated with type 2 diabetes mellitus (HCC)  Z61.0960 Intravitreal Injection, Pharmacologic Agent - OD - Right Eye    Intravitreal Injection, Pharmacologic Agent - OS - Left Eye    aflibercept (EYLEA) SOLN 2 mg    aflibercept (EYLEA) SOLN 2 mg  2. 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 Vitreous hemorrhage of both eyes ([place])  H43.13   4. Retinal edema  H35.81 OCT, Retina - OU - Both Eyes  5. Essential hypertension  I10   6. Hypertensive retinopathy of both eyes  H35.033   7. Pseudophakia of both eyes  Z96.1    1-4. Proliferative diabetic retinopathy with DME, TRD, and vitreous hemorrhage OU (OD > OS)  - last A1c: 11.0 on 12.01.21  - lost to f/u from Jan 2021 to Oct 2021 due to loss of insurance coverage  - pt with complex medical history with hospitalization in Jan-Feb 2020 for bacteremia and abscess  - history of poor glycemic control for years  - s/p IVA OD #1 (06.20.20), #2 (07.01.20), #3 (09.11.20), #4 (10.09.20), #5 (11.06.20), #6 (11.24.21), #7 (12.22.21), #8 (01.19.22)  - s/p IVA OS #1 (06.20.20), #2 (07.01.20), #3 (08.13.20), #4 (09.11.20)  #5 (11.06.20), #6 (11.24.21), #7 (12.22.21), #8 (01.19.22)  - s/p IVE OU #1 (12.04.20) - sample; #2 (01.06.21), #3 (02.18.22), #4 (03.18.22)  - s/p STK OS #1 (10.09.20)  - s/p PRP OS (06.10.20)  - s/p PPV/PFC/EL/FAX/silicone oil OD, 45.40.98  - s/p subconj silicon oil removal OD 8.20.20  - s/p PPV/EL/FAX/silicone oil OS, 11.91.47  - BCVA 20/150 OU (stable OU)              - OCT:  Mild Interval increase in IRF OU  - recommend IVE OU #5 for DME today, 04.18.22  - RBA of procedure discussed, questions answered  - informed consent obtained and signed  - see procedure note             - Eylea benefits investigation  begun 01.19.22 -- approved  - discussed possible need for PPV w/ membrane peel and silicon oil exchange OS due to worsening fibrosis  - cont topical PF and Prolensa QID OU for possible CME component  - f/u 4 weeks -- DFE/OCT, possible injection  5,6. Hypertensive retinopathy OU  - discussed importance of tight BP control  - monitor  7. Pseudophakia OU  - s/p CE with IOL Gershon Crane)  Ophthalmic Meds Ordered this visit:  Meds ordered this encounter  Medications  . aflibercept (EYLEA) SOLN 2 mg  . aflibercept (EYLEA) SOLN 2 mg      Return in about 5 weeks (around 12/31/2020) for f/u PDR OU, DFE, OCT.  There are no Patient Instructions on file for this visit.  This document serves as a record of services personally performed by Gardiner Sleeper, MD, PhD. It was created on their behalf by Leeann Must, [place], an ophthalmic technician. The creation of this record is the provider's dictation and/or activities during the visit.    Electronically signed by: Leeann Must, COA _0 @ 3:48 PM  This document serves as a record of services personally performed by Gardiner Sleeper, MD, PhD. It was created on their behalf by San Jetty. Owens Shark, OA an ophthalmic technician. The creation of this record is the provider's dictation and/or activities during the visit.    Electronically signed by: San Jetty. Marguerita Merles 04.18.2022 3:48 PM  Gardiner Sleeper, M.D., Ph.D. Diseases & Surgery of the Retina and [place] 11/26/2020   I have reviewed the above documentation for accuracy and completeness, and I agree with the above. Gardiner Sleeper, M.D., Ph.D. 11/26/20 3:48 PM  Abbreviations: M myopia (nearsighted); A astigmatism; H hyperopia (farsighted); P presbyopia; Mrx spectacle prescription;  CTL contact lenses; OD right eye; OS left eye; OU both eyes  XT exotropia; ET esotropia; PEK punctate epithelial keratitis; PEE punctate epithelial erosions; DES dry eye syndrome; MGD  meibomian gland dysfunction; ATs artificial tears; PFAT's preservative free artificial tears; [place] nuclear sclerotic cataract; PSC posterior subcapsular cataract; ERM epi-retinal membrane; PVD posterior vitreous detachment; RD retinal detachment; DM diabetes mellitus; DR diabetic retinopathy; NPDR non-proliferative diabetic retinopathy; PDR proliferative diabetic retinopathy; CSME clinically significant macular edema; DME diabetic macular edema; dbh dot blot hemorrhages; CWS cotton wool spot; POAG primary open angle glaucoma; C/D cup-to-disc ratio; HVF humphrey visual field; GVF goldmann visual field; OCT optical coherence tomography; IOP intraocular pressure; BRVO Branch retinal vein occlusion; CRVO central retinal vein occlusion; CRAO central retinal artery occlusion; BRAO branch retinal artery occlusion; RT retinal tear; SB scleral buckle; PPV pars plana vitrectomy; VH Vitreous hemorrhage; PRP panretinal laser photocoagulation; IVK intravitreal kenalog; VMT vitreomacular traction; MH Macular hole;  NVD neovascularization of the disc; NVE neovascularization elsewhere; AREDS age related eye disease study; ARMD age related macular degeneration; POAG primary open angle glaucoma; EBMD epithelial/anterior basement membrane dystrophy; ACIOL anterior chamber intraocular lens; IOL intraocular lens; PCIOL posterior chamber intraocular lens; Phaco/IOL phacoemulsification with intraocular lens placement; [place] photorefractive keratectomy; LASIK laser assisted in situ keratomileusis; HTN hypertension; DM diabetes mellitus; COPD chronic obstructive pulmonary disease

## 2020-11-26 ENCOUNTER — Other Ambulatory Visit: Payer: Self-pay

## 2020-11-26 ENCOUNTER — Encounter (INDEPENDENT_AMBULATORY_CARE_PROVIDER_SITE_OTHER): Payer: Self-pay | Admitting: Ophthalmology

## 2020-11-26 ENCOUNTER — Ambulatory Visit (INDEPENDENT_AMBULATORY_CARE_PROVIDER_SITE_OTHER): Payer: Medicare Other | Admitting: Ophthalmology

## 2020-11-26 DIAGNOSIS — H35033 Hypertensive retinopathy, bilateral: Secondary | ICD-10-CM

## 2020-11-26 DIAGNOSIS — H4313 Vitreous hemorrhage, bilateral: Secondary | ICD-10-CM | POA: Diagnosis not present

## 2020-11-26 DIAGNOSIS — H3343 Traction detachment of retina, bilateral: Secondary | ICD-10-CM

## 2020-11-26 DIAGNOSIS — H35353 Cystoid macular degeneration, bilateral: Secondary | ICD-10-CM

## 2020-11-26 DIAGNOSIS — H3581 Retinal edema: Secondary | ICD-10-CM

## 2020-11-26 DIAGNOSIS — H4921 Sixth [abducent] nerve palsy, right eye: Secondary | ICD-10-CM

## 2020-11-26 DIAGNOSIS — E113513 Type 2 diabetes mellitus with proliferative diabetic retinopathy with macular edema, bilateral: Secondary | ICD-10-CM

## 2020-11-26 DIAGNOSIS — Z961 Presence of intraocular lens: Secondary | ICD-10-CM

## 2020-11-26 DIAGNOSIS — H35352 Cystoid macular degeneration, left eye: Secondary | ICD-10-CM

## 2020-11-26 DIAGNOSIS — I10 Essential (primary) hypertension: Secondary | ICD-10-CM

## 2020-11-26 MED ORDER — AFLIBERCEPT 2MG/0.05ML IZ SOLN FOR KALEIDOSCOPE
2.0000 mg | INTRAVITREAL | Status: AC | PRN
  Administered 2020-11-26: 2 mg via INTRAVITREAL

## 2020-11-28 ENCOUNTER — Ambulatory Visit (INDEPENDENT_AMBULATORY_CARE_PROVIDER_SITE_OTHER): Payer: Medicare Other | Admitting: Podiatry

## 2020-11-28 ENCOUNTER — Other Ambulatory Visit: Payer: Self-pay

## 2020-11-28 ENCOUNTER — Encounter: Payer: Medicare Other | Attending: Internal Medicine | Admitting: Nutrition

## 2020-11-28 DIAGNOSIS — Z992 Dependence on renal dialysis: Secondary | ICD-10-CM | POA: Insufficient documentation

## 2020-11-28 DIAGNOSIS — N186 End stage renal disease: Secondary | ICD-10-CM | POA: Diagnosis present

## 2020-11-28 DIAGNOSIS — M2041 Other hammer toe(s) (acquired), right foot: Secondary | ICD-10-CM

## 2020-11-28 DIAGNOSIS — L84 Corns and callosities: Secondary | ICD-10-CM

## 2020-11-28 DIAGNOSIS — Z794 Long term (current) use of insulin: Secondary | ICD-10-CM | POA: Diagnosis present

## 2020-11-28 DIAGNOSIS — L97512 Non-pressure chronic ulcer of other part of right foot with fat layer exposed: Secondary | ICD-10-CM

## 2020-11-28 DIAGNOSIS — E1122 Type 2 diabetes mellitus with diabetic chronic kidney disease: Secondary | ICD-10-CM | POA: Diagnosis present

## 2020-11-28 DIAGNOSIS — E1142 Type 2 diabetes mellitus with diabetic polyneuropathy: Secondary | ICD-10-CM

## 2020-11-28 DIAGNOSIS — M2042 Other hammer toe(s) (acquired), left foot: Secondary | ICD-10-CM

## 2020-11-28 DIAGNOSIS — L89619 Pressure ulcer of right heel, unspecified stage: Secondary | ICD-10-CM

## 2020-11-28 NOTE — Progress Notes (Signed)
The patient presented to the office to day to pick up diabetic shoes and 3 pair diabetic custom inserts.  1 pair of inserts were put in the shoes and the shoes were fitted to the patient. The patient states they are comfortable and free of defect. He was satisfied with the fit of the shoe. Instructions for break in and wear were dispensed. The patient signed the delivery documentation and break in instruction form.  If any concerns or questions arise, he is instructed to call 

## 2020-11-29 NOTE — Patient Instructions (Signed)
Change sensor every 10 days Change transmitter every 3 months Call Dexcom help line if questions or if sensor falls off.

## 2020-11-29 NOTE — Progress Notes (Signed)
Patient is here with his wife to be trained on the [facility] sensor. We discussed the difference between sensor readings and blood sugar readings.  They reported good understanding of this.  They were shown how/where to insert the sensor and transmitter.  The readings will go to a receiver, because hs phone is not able to download the mobil app.  Alarms were set at 75 for low and 250 for high.  The reader was linked to this practice and we discussed goals for blood sugar control and treatment of low blood sugars related to sensor readings. They had no final questions

## 2020-12-19 ENCOUNTER — Ambulatory Visit: Payer: Medicare Other | Admitting: Podiatry

## 2020-12-19 DIAGNOSIS — G629 Polyneuropathy, unspecified: Secondary | ICD-10-CM | POA: Insufficient documentation

## 2020-12-21 ENCOUNTER — Ambulatory Visit (INDEPENDENT_AMBULATORY_CARE_PROVIDER_SITE_OTHER): Payer: Medicare Other | Admitting: Podiatry

## 2020-12-21 ENCOUNTER — Other Ambulatory Visit: Payer: Self-pay

## 2020-12-21 ENCOUNTER — Encounter: Payer: Self-pay | Admitting: Podiatry

## 2020-12-21 DIAGNOSIS — L97512 Non-pressure chronic ulcer of other part of right foot with fat layer exposed: Secondary | ICD-10-CM

## 2020-12-21 DIAGNOSIS — E1142 Type 2 diabetes mellitus with diabetic polyneuropathy: Secondary | ICD-10-CM | POA: Diagnosis not present

## 2020-12-25 ENCOUNTER — Encounter: Payer: Self-pay | Admitting: Podiatry

## 2020-12-25 NOTE — Progress Notes (Signed)
Subjective:  Patient ID: Randy Oneal, male    DOB: 04-Aug-1966,  MRN: [id number]  Chief Complaint  Patient presents with  . Callouses    Left foot doing well, however thickened painful area on right hallux is still bothersome.     55 y.o. male presents for wound care.  Patient presents with a follow-up of right hallux IPJ ulceration that may have reopened again.  Patient states that it is gotten bigger and progressive gotten worse.  He has been wearing his diabetic shoes that he has obtained.  He denies any other acute complaints.   Review of Systems: Negative except as noted in the HPI. Denies N/V/F/Ch.  Past Medical History:  Diagnosis Date  . Anemia   . Cataract 07/02/2020  . Diabetes mellitus    Type 2   . Diabetic retinopathy of both eyes ([place]) 01/31/2019   Dr Coralyn Pear 01/2019  . ESRD (end stage renal disease) ([place])    [place] Frenis[facility]  . Herpes simplex virus (HSV) infection    OU  . Hypertension   . Hypertensive retinopathy    OU  . Retinal detachment    OS  . Sepsis due to Streptococcus, group B ([place]) 10/05/2018    Current Outpatient Medications:  .  acetaminophen (TYLENOL) 500 MG tablet, Take 1,000 mg by mouth every 6 (six) hours as needed for moderate pain or headache., Disp: , Rfl:  .  amLODipine (NORVASC) 2.5 MG tablet, TAKE ONE TABLET BY MOUTH ONCE DAILY., Disp: 30 tablet, Rfl: 0 .  atorvastatin (LIPITOR) 10 MG tablet, Take 1 tablet (10 mg total) by mouth daily., Disp: 30 tablet, Rfl: 5 .  brimonidine (ALPHAGAN) 0.2 % ophthalmic solution, Place 1 drop into both eyes daily. (Patient not taking: Reported on 10/26/2020), Disp: 10 mL, Rfl: 10 .  Bromfenac Sodium (PROLENSA) 0.07 % SOLN, Place 1 drop into both eyes 4 (four) times daily., Disp: 6 mL, Rfl: 10 .  calcium acetate (PHOSLO) 667 MG capsule, Take 1,334 mg by mouth 3 (three) times daily with meals., Disp: , Rfl:  .  Calcium Acetate 668 (169 Ca) MG TABS, TAKE 2 TABLETS BY MOUTH THREE TIMES DAILY WITH MEALS,  Disp: , Rfl:  .  cinacalcet (SENSIPAR) 30 MG tablet, Take by mouth., Disp: , Rfl:  .  Continuous Blood Gluc Sensor (DEXCOM G6 SENSOR) MISC, 1 Device by Does not apply route as directed., Disp: 3 each, Rfl: 11 .  Continuous Blood Gluc Transmit (DEXCOM G6 TRANSMITTER) MISC, 1 Device by Does not apply route as directed., Disp: 1 each, Rfl: 3 .  Darbepoetin Alfa (ARANESP) 60 MCG/0.3ML SOSY injection, Inject 0.3 mLs (60 mcg total) into the vein every Monday with hemodialysis., Disp: , Rfl:  .  famotidine (PEPCID) 40 MG tablet, Take 0.5 tablets (20 mg total) by mouth at bedtime., Disp: 30 tablet, Rfl: 1 .  glucose blood (ONETOUCH ULTRA) test strip, 1 each by Other route in the morning, at noon, in the evening, and at bedtime. Use as instructed, Disp: 400 strip, Rfl: 3 .  insulin lispro (HUMALOG KWIKPEN) 100 UNIT/ML KwikPen, Inject 7 Units into the skin daily with breakfast., Disp: 30 mL, Rfl: 1 .  Insulin Pen Needle 32G X 4 MM MISC, 1 Device by Does not apply route in the morning, at noon, in the evening, and at bedtime., Disp: 400 each, Rfl: 1 .  lidocaine-prilocaine (EMLA) cream, SMARTSIG:Sparingly Topical 3 Times a Week, Disp: , Rfl:  .  Methoxy PEG-Epoetin Beta (MIRCERA IJ), Mircera, Disp: ,  Rfl:  .  metoprolol tartrate (LOPRESSOR) 25 MG tablet, Take 1 tablet (25 mg total) by mouth 2 (two) times daily., Disp: 60 tablet, Rfl: 1 .  MICROLET LANCETS MISC, USE FOUR TIMES A DAY AS DIRECTED., Disp: 100 each, Rfl: 0 .  nystatin (MYCOSTATIN/NYSTOP) powder, Apply topically 2 (two) times daily. To groin and scrotum (Patient taking differently: Apply 1 g topically 2 (two) times daily as needed (rash). To groin and scrotum), Disp: 15 g, Rfl: 0 .  pantoprazole (PROTONIX) 40 MG tablet, Take by mouth., Disp: , Rfl:  .  prednisoLONE acetate (PRED FORTE) 1 % ophthalmic suspension, Place 1 drop into both eyes 4 (four) times daily., Disp: 15 mL, Rfl: 3 .  sucroferric oxyhydroxide (VELPHORO) 500 MG chewable tablet, Chew  500 mg by mouth 3 (three) times daily with meals., Disp: , Rfl:  .  TRESIBA FLEXTOUCH 100 UNIT/ML FlexTouch Pen, Inject 22 Units into the skin at bedtime., Disp: 30 mL, Rfl: 1  Social History   Tobacco Use  Smoking Status Never Smoker  Smokeless Tobacco Never Used    Allergies  Allergen Reactions  . Tramadol Nausea And Vomiting   Objective:  There were no vitals filed for this visit. There is no height or weight on file to calculate BMI. Constitutional Well developed. Well nourished.  Vascular Dorsalis pedis pulses palpable bilaterally. Posterior tibial pulses palpable bilaterally. Capillary refill normal to all digits.  No cyanosis or clubbing noted. Pedal hair growth normal.  Neurologic Normal speech. Oriented to person, place, and time. Protective sensation absent  Dermatologic Wound Location: Right hallux IPJ limited to the breakdown of the skin.  Does not probe to bone.  No clinical signs of infection including erythema/cellulitis noted Wound Base: Mixed Granular/Fibrotic Peri-wound: Macerated Exudate: Scant/small amount Serous exudate Wound Measurements: -See below  Orthopedic: No pain to palpation either foot.   Radiographs: None Assessment:   1. Diabetic polyneuropathy associated with type 2 diabetes mellitus ([place])   2. Skin ulcer of right great toe with fat layer exposed ([place])    Plan:  Patient was evaluated and treated and all questions answered.  Ulcer right hallux limited to the breakdown of the skin/partial-thickness -Debridement as below. -Dressed with Betadine wet-to-dry, DSD. -Continue off-loading with surgical shoe. -Patient will go back to surgical shoe.  New surgical shoe was dispensed.  Procedure: Excisional Debridement of Wound Tool: Sharp chisel blade/tissue nipper Rationale: Removal of non-viable soft tissue from the wound to promote healing.  Anesthesia: none Pre-Debridement Wound Measurements: 0.8 cm x 0.3 cm x 0.1 cm  Post-Debridement  Wound Measurements: 0.9 cm x 0.3 cm x 0.2 cm  Type of Debridement: Sharp Excisional Tissue Removed: Non-viable soft tissue Blood loss: Minimal (<50cc) Depth of Debridement: subcutaneous tissue. Technique: Sharp excisional debridement to bleeding, viable wound base.  Wound Progress: This is my initial evaluation for wound.  I will continue to monitor the progression of it. Site healing conversation 7 Dressing: Dry, sterile, compression dressing. Disposition: Patient tolerated procedure well. Patient to return in 1 week for follow-up.  No follow-ups on file.

## 2020-12-28 NOTE — Progress Notes (Signed)
[facility] Note  01/02/2021     CHIEF COMPLAINT Patient presents for Retina Follow Up   HISTORY OF PRESENT ILLNESS: Randy Oneal is a 55 y.o. male who presents to the clinic today for:  HPI    Retina Follow Up    Patient presents with  Diabetic Retinopathy.  In both eyes.  Duration of 5 weeks.  Since onset it is stable.  I, the attending physician,  performed the HPI with the patient and updated documentation appropriately.          Comments    5 week follow up [place] is "holding steady" since last visit. Using Prolensa QID and Prednisolone QID OU.  BS 124 yesterday A1C 8.5       Last edited by Bernarda Caffey, MD on 01/03/2021 11:24 PM. (History)    Pt states vision is stable, he has been told he is a good candidate for a kidney and pancreas transplant, pt is taking PF and Prolensa QID  Referring physician: [name], MD [address] [place],  [place] [address]  HISTORICAL INFORMATION:   Selected notes from the MEDICAL RECORD NUMBER Referred by Dr.Mark Gershon Crane for concern of decreased vision post cataract sx   CURRENT MEDICATIONS: Current Outpatient Medications (Ophthalmic Drugs)  Medication Sig  . Bromfenac Sodium (PROLENSA) 0.07 % SOLN Place 1 drop into both eyes 4 (four) times daily.  . prednisoLONE acetate (PRED FORTE) 1 % ophthalmic suspension Place 1 drop into both eyes 4 (four) times daily.  . brimonidine (ALPHAGAN) 0.2 % ophthalmic solution Place 1 drop into both eyes daily. (Patient not taking: No sig reported)   No current facility-administered medications for this visit. (Ophthalmic Drugs)   Current Outpatient Medications (Other)  Medication Sig  . acetaminophen (TYLENOL) 500 MG tablet Take 1,000 mg by mouth every 6 (six) hours as needed for moderate pain or headache.  Marland Kitchen amLODipine (NORVASC) 2.5 MG tablet TAKE ONE TABLET BY MOUTH ONCE DAILY.  Marland Kitchen atorvastatin (LIPITOR) 10 MG tablet Take 1 tablet (10 mg  total) by mouth daily.  . calcium acetate (PHOSLO) 667 MG capsule Take 1,334 mg by mouth 3 (three) times daily with meals.  . Calcium Acetate 668 (169 Ca) MG TABS TAKE 2 TABLETS BY MOUTH THREE TIMES DAILY WITH MEALS  . cinacalcet (SENSIPAR) 30 MG tablet Take by mouth.  . Darbepoetin Alfa (ARANESP) 60 MCG/0.3ML SOSY injection Inject 0.3 mLs (60 mcg total) into the vein every Monday with hemodialysis.  . famotidine (PEPCID) 40 MG tablet Take 0.5 tablets (20 mg total) by mouth at bedtime.  . insulin lispro (HUMALOG KWIKPEN) 100 UNIT/ML KwikPen Inject 7 Units into the skin daily with breakfast.  . lidocaine-prilocaine (EMLA) cream SMARTSIG:Sparingly Topical 3 Times a Week  . Methoxy PEG-Epoetin Beta (MIRCERA IJ) Mircera  . metoprolol tartrate (LOPRESSOR) 25 MG tablet Take 1 tablet (25 mg total) by mouth 2 (two) times daily.  Marland Kitchen nystatin (MYCOSTATIN/NYSTOP) powder Apply topically 2 (two) times daily. To groin and scrotum (Patient taking differently: Apply 1 g topically 2 (two) times daily as needed (rash). To groin and scrotum)  . pantoprazole (PROTONIX) 40 MG tablet Take by mouth.  . sucroferric oxyhydroxide (VELPHORO) 500 MG chewable tablet Chew 500 mg by mouth 3 (three) times daily with meals.  [name] 100 UNIT/ML FlexTouch Pen Inject 22 Units into the skin at bedtime.  . Continuous Blood Gluc Sensor (DEXCOM G6 SENSOR) MISC 1 Device by Does  not apply route as directed.  . Continuous Blood Gluc Transmit (DEXCOM G6 TRANSMITTER) MISC 1 Device by Does not apply route as directed.  Marland Kitchen glucose blood (ONETOUCH ULTRA) test strip 1 each by Other route in the morning, at noon, in the evening, and at bedtime. Use as instructed  . Insulin Pen Needle 32G X 4 MM MISC 1 Device by Does not apply route in the morning, at noon, in the evening, and at bedtime.  Marland Kitchen MICROLET LANCETS MISC USE FOUR TIMES A DAY AS DIRECTED.   No current facility-administered medications for this visit. (Other)      REVIEW OF  SYSTEMS: ROS    Positive for: Genitourinary, Endocrine, Eyes   Negative for: Constitutional, Gastrointestinal, Neurological, Skin, Musculoskeletal, HENT, Cardiovascular, Respiratory, Psychiatric, Allergic/Imm, Heme/Lymph   Last edited by Leonie Douglas, COA on 01/02/2021  2:35 PM. (History)       ALLERGIES Allergies  Allergen Reactions  . Tramadol Nausea And Vomiting    PAST MEDICAL HISTORY Past Medical History:  Diagnosis Date  . Anemia   . Cataract 07/02/2020  . Diabetes mellitus    Type 2   . Diabetic retinopathy of both eyes ([place]) 01/31/2019   Dr Coralyn Pear 01/2019  . ESRD (end stage renal disease) ([place])    [place] Frenisus  . Herpes simplex virus (HSV) infection    OU  . Hypertension   . Hypertensive retinopathy    OU  . Retinal detachment    OS  . Sepsis due to Streptococcus, group B ([place]) 10/05/2018   Past Surgical History:  Procedure Laterality Date  . [place] VITRECTOMY WITH 20 GAUGE MVR PORT FOR MACULAR HOLE Right 02/24/2019   Procedure: 25 GAUGE PARS PLANA VITRECTOMY WITH 20 GAUGE MVR PORT FOR MACULAR HOLE;  Surgeon: Bernarda Caffey, MD;  Location: [facility];  Service: Ophthalmology;  Laterality: Right;  . APPLICATION OF WOUND VAC Left 08/27/2018   Procedure: APPLICATION OF WOUND VAC, left neck;  Surgeon: Gaye Pollack, MD;  Location: [facility];  Service: Thoracic;  Laterality: Left;  . APPLICATION OF WOUND VAC Left 08/30/2018   Procedure: APPLICATION OF WOUND VAC;  Surgeon: Gaye Pollack, MD;  Location: [facility];  Service: Thoracic;  Laterality: Left;  . AV FISTULA PLACEMENT Left 02/15/2020   Procedure: LEFT ARM ARTERIOVENOUS (AV) FISTULA CREATION;  Surgeon: Waynetta Sandy, MD;  Location: [facility];  Service: Vascular;  Laterality: Left;  . CATARACT EXTRACTION Bilateral   . CATARACT EXTRACTION W/ INTRAOCULAR LENS  IMPLANT, BILATERAL    . COLONOSCOPY  06/24/2011   Procedure: COLONOSCOPY;  Surgeon: [name], MD;  [facility];  Service:  Endoscopy;  Laterality: N/A;  8:30 AM  . EYE SURGERY    . GAS/FLUID EXCHANGE Left 03/31/2019   Procedure: Gas/Fluid Exchange;  Surgeon: Bernarda Caffey, MD;  Location: [facility];  Service: Ophthalmology;  Laterality: Left;  . INJECTION OF SILICONE OIL  09/06/5168   Procedure: Injection Of Silicone Oil;  Surgeon: Bernarda Caffey, MD;  Location: [facility];  Service: Ophthalmology;;  . INSERTION OF DIALYSIS CATHETER Right 12/05/2019   Procedure: INSERTION OF DIALYSIS CATHETER RIGHT SUBCLAVIAN;  Surgeon: Virl Cagey, MD;  Location[facility];  Service: General;  Laterality: Right;  . INSERTION OF DIALYSIS CATHETER Right 12/07/2019   Procedure: Minor Dialysis catheter in place- need to reposition/ adjust catheter to help with flows;  Surgeon: Virl Cagey, MD;  Location: [facility];  Service: General;  Laterality: Right;  procedure room  case with 1% lidocaine, full sterile drape,  towels, full gown, large chloraprep, 4-0 Monocryl and dermabond   . INSERTION OF DIALYSIS CATHETER Right 12/08/2019   Procedure: INSERTION OF DIALYSIS CATHETER EXCHANGE;  Surgeon: Virl Cagey, MD;  [facility];  Service: General;  Laterality: Right;  . MEMBRANE PEEL Right 02/24/2019   Procedure: Antoine Primas;  Surgeon: Bernarda Caffey, MD;  Location: [facility];  Service: Ophthalmology;  Laterality: Right;  . PARS PLANA VITRECTOMY Left 03/31/2019   Procedure: PARS PLANA VITRECTOMY WITH 25 GAUGE WITH MEMBRANE PEEL;  Surgeon: Bernarda Caffey, MD;  Location: [facility];  Service: Ophthalmology;  Laterality: Left;  . PHOTOCOAGULATION WITH LASER Right 02/24/2019   Procedure: Photocoagulation With Laser;  Surgeon: Bernarda Caffey, MD;  Location: [facility];  Service: Ophthalmology;  Laterality: Right;  . PHOTOCOAGULATION WITH LASER Left 03/31/2019   Procedure: Photocoagulation With Laser;  Surgeon: Bernarda Caffey, MD;  Location: [facility];  Service: Ophthalmology;  Laterality: Left;  . RETINAL DETACHMENT SURGERY Left 03/31/2019   TRD Repair - Dr. Bernarda Caffey  . SILICON OIL REMOVAL Right 0/62/3762   Procedure: Silicon Oil Removal;  Surgeon: Bernarda Caffey, MD;  Location: [facility];  Service: Ophthalmology;  Laterality: Right;  . STERNAL WOUND DEBRIDEMENT Left 08/27/2018   Procedure: Incision and DEBRIDEMENT Left Chest, Neck and Mediastinum;  Surgeon: Gaye Pollack, MD;  [facility];  Service: Thoracic;  Laterality: Left;  . STERNAL WOUND DEBRIDEMENT Left 08/30/2018   Procedure: WOUND VAC CHANGE, LEFT CHEST AND NECK, POSSIBLE DEBRIDEMENT;  Surgeon: Gaye Pollack, MD;  [facility];  Service: Thoracic;  Laterality: Left;    FAMILY HISTORY Family History  Problem Relation Age of Onset  . Hypertension Mother   . Colon cancer Neg Hx     SOCIAL HISTORY Social History   Tobacco Use  . Smoking status: Never Smoker  . Smokeless tobacco: Never Used  Vaping Use  . Vaping Use: Never used  Substance Use Topics  . Alcohol use: No  . Drug use: No         OPHTHALMIC EXAM:  Base Eye Exam    Visual Acuity (Snellen - Linear)      Right Left   Dist [place] 20/300 -1 20/250   Dist ph [place] 20/150 20/150       Tonometry (Tonopen, 2:45 PM)      Right Left   Pressure 15 17       Pupils      Shape React APD   Right Irregular Minimal None   Left Irregular Minimal None  Slight pupil movement with slit lamp OU.       Visual Fields      Left Right   Restrictions Partial outer superior nasal, inferior nasal deficiencies Partial outer superior temporal, inferior temporal, superior nasal, inferior nasal deficiencies       Extraocular Movement      Right Left    Full Full       Neuro/Psych    Oriented x3: Yes   Mood/Affect: Normal       Dilation    Both eyes: 1.0% Mydriacyl, 2.5% Phenylephrine @ 2:45 PM        Slit Lamp and Fundus Exam    Slit Lamp Exam      Right Left   Lids/Lashes mild Meibomian gland dysfunction mild Meibomian gland dysfunction   Conjunctiva/Sclera subconj silicon oil bubbles, Chemosis, conj Cyst White  and quiet   Cornea Trace PEE, Well healed cataract  wound, arcus, Debris in tear film Trace PEE, Well healed cataract wound, arcus, Debris in tear film   Anterior Chamber Deep and quiet, no cell or flare Deep and quiet, no cell or flare   Iris slightly irregular dilation, posterior synchiae from 3:00-1200 round, focal Posterior synechiae at 1030   Lens PC IOL in good position, pigment deposition, 1+ PCO, mild pigment deposition on optic PC IOL in good position, mild pigment deposition   Vitreous Post vit, silicon oil bubble ~94% post vitrectomy, good silicone oil fill       Fundus Exam      Right Left   Disc 2+pallor, sharp rim, mild fibrosis along ST rim +pallor, sharp rim, fine NVD, mild fibrosis   C/D Ratio 0.2 0.2   Macula attached under oil, scattered MA/IRH, nasal and central thickening / edema -- slightly improved attached under oil, +fibrosis/edema -- persistent scattered IRH, focal bands of PRF with traction emanating from ST macula, cluster of DBH inferior mac -- improved   Vessels attenuated, Tortuous Attenuated, dilated venules, Tortuous, early NVE IT arcades   Periphery Attached, dense 360 PRP laser, mild scattered fibrosis - persistent, Focal ret hole along distal IT arcades--good laser surrounding Attached, good 360 PRP laser changes, pre-retinal fibrosis extending to posterior PRP border superior and inferiorly          IMAGING AND PROCEDURES  Imaging and Procedures for _0 @  OCT, Retina - OU - Both Eyes       Right Eye Quality was good. Central Foveal Thickness: 716. Progression has improved. Findings include preretinal fibrosis, abnormal foveal contour, outer retinal atrophy, epiretinal membrane, intraretinal fluid, no SRF (Interval improvement in IRF and central ).   Left Eye Quality was good. Central Foveal Thickness: 592. Progression has improved. Findings include preretinal fibrosis, intraretinal fluid, abnormal foveal contour, intraretinal hyper-reflective  material, epiretinal membrane, no SRF (Mild Interval improvement in IRF).   Notes *Images captured and stored on drive  Diagnosis / Impression:  +DME OU Mild Interval improvement in edema/IRF OU   Clinical management:  See below  Abbreviations: NFP - Normal foveal profile. CME - cystoid macular edema. PED - pigment epithelial detachment. IRF - intraretinal fluid. SRF - subretinal fluid. EZ - ellipsoid zone. ERM - epiretinal membrane. ORA - outer retinal atrophy. ORT - outer retinal tubulation. SRHM - subretinal hyper-reflective material        Intravitreal Injection, Pharmacologic Agent - OD - Right Eye       Time Out 01/02/2021. 3:37 PM. Confirmed correct patient, procedure, site, and patient consented.   Anesthesia Topical anesthesia was used. Anesthetic medications included Lidocaine 2%, Proparacaine 0.5%.   Procedure Preparation included eyelid speculum, 5% betadine to ocular surface. A (32g) needle was used.   Injection:  2 mg aflibercept Alfonse Flavors) SOLN   NDC: A3590391, Lot: [id number], Expiration date: 08/09/2021   Route: Intravitreal, Site: Right Eye, Waste: 0.05 mL  Post-op Post injection exam found visual acuity of at least counting fingers. The patient tolerated the procedure well. There were no complications. The patient received written and verbal post procedure care education. Post injection medications were not given.        Intravitreal Injection, Pharmacologic Agent - OS - Left Eye       Time Out 01/02/2021. 3:38 PM. Confirmed correct patient, procedure, site, and patient consented.   Anesthesia Topical anesthesia was used. Anesthetic medications included Lidocaine 2%, Proparacaine 0.5%.   Procedure Preparation included 5% betadine to ocular surface, eyelid speculum. A (32g) needle  was used.   Injection:  2 mg aflibercept Alfonse Flavors) SOLN   NDC: M7179715, Lot: [id number], Expiration date: 10/08/2021   Route: Intravitreal, Site: Left Eye, Waste:  0.05 mL  Post-op Post injection exam found visual acuity of at least counting fingers. The patient tolerated the procedure well. There were no complications. The patient received written and verbal post procedure care education. Post injection medications were not given.                 ASSESSMENT/PLAN:    ICD-10-CM   1. Proliferative diabetic retinopathy of both eyes with macular edema associated with type 2 diabetes mellitus (HCC)  D98.3382 Intravitreal Injection, Pharmacologic Agent - OD - Right Eye    Intravitreal Injection, Pharmacologic Agent - OS - Left Eye    aflibercept (EYLEA) SOLN 2 mg    aflibercept (EYLEA) SOLN 2 mg  2. 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 Vitreous hemorrhage of both eyes ([place])  H43.13   4. Retinal edema  H35.81 OCT, Retina - OU - Both Eyes  5. Essential hypertension  I10   6. Hypertensive retinopathy of both eyes  H35.033   7. Pseudophakia of both eyes  Z96.1    1-4. Proliferative diabetic retinopathy with DME, TRD, and vitreous hemorrhage OU (OD > OS)  - last A1c: 8.5 on 03.02.22  - lost to f/u from Jan 2021 to Oct 2021 due to loss of insurance coverage  - pt with complex medical history with hospitalization in Jan-Feb 2020 for bacteremia and abscess  - history of poor glycemic control for years  - s/p IVA OD #1 (06.20.20), #2 (07.01.20), #3 (09.11.20), #4 (10.09.20), #5 (11.06.20), #6 (11.24.21), #7 (12.22.21), #8 (01.19.22)  - s/p IVA OS #1 (06.20.20), #2 (07.01.20), #3 (08.13.20), #4 (09.11.20)  #5 (11.06.20), #6 (11.24.21), #7 (12.22.21), #8 (01.19.22)  - s/p IVE OU #1 (12.04.20) - sample; #2 (01.06.21), #3 (02.18.22), #4 (03.18.22), #5 (04.18.22)  - s/p STK OS #1 (10.09.20)  - s/p PRP OS (06.10.20)  - s/p PPV/PFC/EL/FAX/silicone oil OD, 50.53.97  - s/p subconj silicon oil removal OD 8.20.20  - s/p PPV/EL/FAX/silicone oil OS, 67.34.19  - BCVA 20/150 OU (stable OU)              - OCT: Interval improvement in edema/IRF  OU  - recommend IVE OU #6 for DME today, 05.25.22  - RBA of procedure discussed, questions answered  - informed consent obtained and signed  - see procedure note             - Eylea benefits investigation begun 01.19.22 -- approved  - discussed possible need for PPV w/ membrane peel and silicon oil exchange OS due to worsening fibrosis  - cont topical PF and Prolensa QID OU for possible CME component  - f/u 4 weeks -- DFE/OCT, possible injection  5,6. Hypertensive retinopathy OU  - discussed importance of tight BP control  - monitor  7. Pseudophakia OU  - s/p CE with IOL Gershon Crane)  Ophthalmic Meds Ordered this visit:  Meds ordered this encounter  Medications  . aflibercept (EYLEA) SOLN 2 mg  . aflibercept (EYLEA) SOLN 2 mg      Return in about 5 weeks (around 02/06/2021) for f/u PDR OU, DFE, OCT.  There are no Patient Instructions on file for this visit.  This document serves as a record of services personally performed by Gardiner Sleeper, MD, PhD. It was created on their behalf by Roselee Nova, COMT. The creation  of this record is the provider's dictation and/or activities during the visit.  Electronically signed by: Roselee Nova, COMT 01/03/21 11:35 PM  This document serves as a record of services personally performed by Gardiner Sleeper, MD, PhD. It was created on their behalf by San Jetty. Owens Shark, OA an ophthalmic technician. The creation of this record is the provider's dictation and/or activities during the visit.    Electronically signed by: San Jetty. Owens Shark, [place] 05.25.2022 11:35 PM  Gardiner Sleeper, M.D., Ph.D. Diseases & Surgery of the Retina and Vitreous [facility]  I have reviewed the above documentation for accuracy and completeness, and I agree with the above. Gardiner Sleeper, M.D., Ph.D. 01/03/21 11:35 PM   Abbreviations: M myopia (nearsighted); A astigmatism; H hyperopia (farsighted); P presbyopia; Mrx spectacle prescription;  CTL contact  lenses; OD right eye; OS left eye; OU both eyes  XT exotropia; ET esotropia; PEK punctate epithelial keratitis; PEE punctate epithelial erosions; DES dry eye syndrome; MGD meibomian gland dysfunction; ATs artificial tears; PFAT's preservative free artificial tears; [place] nuclear sclerotic cataract; PSC posterior subcapsular cataract; ERM epi-retinal membrane; PVD posterior vitreous detachment; RD retinal detachment; DM diabetes mellitus; DR diabetic retinopathy; NPDR non-proliferative diabetic retinopathy; PDR proliferative diabetic retinopathy; CSME clinically significant macular edema; DME diabetic macular edema; dbh dot blot hemorrhages; CWS cotton wool spot; POAG primary open angle glaucoma; C/D cup-to-disc ratio; HVF humphrey visual field; GVF goldmann visual field; OCT optical coherence tomography; IOP intraocular pressure; BRVO Branch retinal vein occlusion; CRVO central retinal vein occlusion; CRAO central retinal artery occlusion; BRAO branch retinal artery occlusion; RT retinal tear; SB scleral buckle; PPV pars plana vitrectomy; VH Vitreous hemorrhage; PRP panretinal laser photocoagulation; IVK intravitreal kenalog; VMT vitreomacular traction; MH Macular hole;  NVD neovascularization of the disc; NVE neovascularization elsewhere; AREDS age related eye disease study; ARMD age related macular degeneration; POAG primary open angle glaucoma; EBMD epithelial/anterior basement membrane dystrophy; ACIOL anterior chamber intraocular lens; IOL intraocular lens; PCIOL posterior chamber intraocular lens; Phaco/IOL phacoemulsification with intraocular lens placement; [place] photorefractive keratectomy; LASIK laser assisted in situ keratomileusis; HTN hypertension; DM diabetes mellitus; COPD chronic obstructive pulmonary disease

## 2021-01-01 ENCOUNTER — Encounter (INDEPENDENT_AMBULATORY_CARE_PROVIDER_SITE_OTHER): Payer: Medicare Other | Admitting: Ophthalmology

## 2021-01-02 ENCOUNTER — Ambulatory Visit (INDEPENDENT_AMBULATORY_CARE_PROVIDER_SITE_OTHER): Payer: Medicare Other | Admitting: Ophthalmology

## 2021-01-02 ENCOUNTER — Other Ambulatory Visit: Payer: Self-pay

## 2021-01-02 DIAGNOSIS — Z961 Presence of intraocular lens: Secondary | ICD-10-CM

## 2021-01-02 DIAGNOSIS — H3581 Retinal edema: Secondary | ICD-10-CM | POA: Diagnosis not present

## 2021-01-02 DIAGNOSIS — H35033 Hypertensive retinopathy, bilateral: Secondary | ICD-10-CM

## 2021-01-02 DIAGNOSIS — H3343 Traction detachment of retina, bilateral: Secondary | ICD-10-CM | POA: Diagnosis not present

## 2021-01-02 DIAGNOSIS — H4313 Vitreous hemorrhage, bilateral: Secondary | ICD-10-CM | POA: Diagnosis not present

## 2021-01-02 DIAGNOSIS — I10 Essential (primary) hypertension: Secondary | ICD-10-CM

## 2021-01-02 DIAGNOSIS — E113513 Type 2 diabetes mellitus with proliferative diabetic retinopathy with macular edema, bilateral: Secondary | ICD-10-CM

## 2021-01-03 ENCOUNTER — Encounter (INDEPENDENT_AMBULATORY_CARE_PROVIDER_SITE_OTHER): Payer: Self-pay | Admitting: Ophthalmology

## 2021-01-03 DIAGNOSIS — E113513 Type 2 diabetes mellitus with proliferative diabetic retinopathy with macular edema, bilateral: Secondary | ICD-10-CM | POA: Diagnosis not present

## 2021-01-03 MED ORDER — AFLIBERCEPT 2MG/0.05ML IZ SOLN FOR KALEIDOSCOPE
2.0000 mg | INTRAVITREAL | Status: AC | PRN
  Administered 2021-01-03: 2 mg via INTRAVITREAL

## 2021-01-18 ENCOUNTER — Ambulatory Visit (INDEPENDENT_AMBULATORY_CARE_PROVIDER_SITE_OTHER): Payer: Medicare Other | Admitting: Podiatry

## 2021-01-18 ENCOUNTER — Ambulatory Visit (INDEPENDENT_AMBULATORY_CARE_PROVIDER_SITE_OTHER): Payer: Medicare Other

## 2021-01-18 ENCOUNTER — Other Ambulatory Visit: Payer: Self-pay

## 2021-01-18 DIAGNOSIS — E1142 Type 2 diabetes mellitus with diabetic polyneuropathy: Secondary | ICD-10-CM

## 2021-01-18 DIAGNOSIS — I999 Unspecified disorder of circulatory system: Secondary | ICD-10-CM | POA: Diagnosis not present

## 2021-01-18 DIAGNOSIS — L97512 Non-pressure chronic ulcer of other part of right foot with fat layer exposed: Secondary | ICD-10-CM | POA: Diagnosis not present

## 2021-01-24 ENCOUNTER — Encounter: Payer: Self-pay | Admitting: Podiatry

## 2021-01-24 NOTE — Progress Notes (Signed)
Subjective:  Patient ID: Randy Oneal, male    DOB: 23-Nov-1965,  MRN: [id number]  Chief Complaint  Patient presents with   Callouses    Callus on right hallux and nail tirm     55 y.o. male presents for wound care.  Patient presents with a follow-up of right hallux IPJ ulceration that may have reopened again.  Patient states that it is gotten bigger and progressive gotten worse.  He has been wearing his diabetic shoes that he has obtained.  He denies any other acute complaints.   Review of Systems: Negative except as noted in the HPI. Denies N/V/F/Ch.  Past Medical History:  Diagnosis Date   Anemia    Cataract 07/02/2020   Diabetes mellitus    Type 2    Diabetic retinopathy of both eyes ([place]) 01/31/2019   Dr Coralyn Pear 01/2019   ESRD (end stage renal disease) ([place])    [place] [facility]   Herpes simplex virus (HSV) infection    OU   Hypertension    Hypertensive retinopathy    OU   Retinal detachment    OS   Sepsis due to Streptococcus, group B ([place]) 10/05/2018    Current Outpatient Medications:    acetaminophen (TYLENOL) 500 MG tablet, Take 1,000 mg by mouth every 6 (six) hours as needed for moderate pain or headache., Disp: , Rfl:    amLODipine (NORVASC) 2.5 MG tablet, TAKE ONE TABLET BY MOUTH ONCE DAILY., Disp: 30 tablet, Rfl: 0   atorvastatin (LIPITOR) 10 MG tablet, Take 1 tablet (10 mg total) by mouth daily., Disp: 30 tablet, Rfl: 5   brimonidine (ALPHAGAN) 0.2 % ophthalmic solution, Place 1 drop into both eyes daily. (Patient not taking: No sig reported), Disp: 10 mL, Rfl: 10   Bromfenac Sodium (PROLENSA) 0.07 % SOLN, Place 1 drop into both eyes 4 (four) times daily., Disp: 6 mL, Rfl: 10   calcium acetate (PHOSLO) 667 MG capsule, Take 1,334 mg by mouth 3 (three) times daily with meals., Disp: , Rfl:    Calcium Acetate 668 (169 Ca) MG TABS, TAKE 2 TABLETS BY MOUTH THREE TIMES DAILY WITH MEALS, Disp: , Rfl:    cinacalcet (SENSIPAR) 30 MG tablet, Take by mouth., Disp: , Rfl:     Continuous Blood Gluc Sensor (DEXCOM G6 SENSOR) MISC, 1 Device by Does not apply route as directed., Disp: 3 each, Rfl: 11   Continuous Blood Gluc Transmit (DEXCOM G6 TRANSMITTER) MISC, 1 Device by Does not apply route as directed., Disp: 1 each, Rfl: 3   Darbepoetin Alfa (ARANESP) 60 MCG/0.3ML SOSY injection, Inject 0.3 mLs (60 mcg total) into the vein every Monday with hemodialysis., Disp: , Rfl:    famotidine (PEPCID) 40 MG tablet, Take 0.5 tablets (20 mg total) by mouth at bedtime., Disp: 30 tablet, Rfl: 1   glucose blood (ONETOUCH ULTRA) test strip, 1 each by Other route in the morning, at noon, in the evening, and at bedtime. Use as instructed, Disp: 400 strip, Rfl: 3   insulin lispro (HUMALOG KWIKPEN) 100 UNIT/ML KwikPen, Inject 7 Units into the skin daily with breakfast., Disp: 30 mL, Rfl: 1   Insulin Pen Needle 32G X 4 MM MISC, 1 Device by Does not apply route in the morning, at noon, in the evening, and at bedtime., Disp: 400 each, Rfl: 1   lidocaine-prilocaine (EMLA) cream, SMARTSIG:Sparingly Topical 3 Times a Week, Disp: , Rfl:    Methoxy PEG-Epoetin Beta (MIRCERA IJ), Mircera, Disp: , Rfl:    metoprolol tartrate (  LOPRESSOR) 25 MG tablet, Take 1 tablet (25 mg total) by mouth 2 (two) times daily., Disp: 60 tablet, Rfl: 1   MICROLET LANCETS MISC, USE FOUR TIMES A DAY AS DIRECTED., Disp: 100 each, Rfl: 0   nystatin (MYCOSTATIN/NYSTOP) powder, Apply topically 2 (two) times daily. To groin and scrotum (Patient taking differently: Apply 1 g topically 2 (two) times daily as needed (rash). To groin and scrotum), Disp: 15 g, Rfl: 0   pantoprazole (PROTONIX) 40 MG tablet, Take by mouth., Disp: , Rfl:    prednisoLONE acetate (PRED FORTE) 1 % ophthalmic suspension, Place 1 drop into both eyes 4 (four) times daily., Disp: 15 mL, Rfl: 3   sucroferric oxyhydroxide (VELPHORO) 500 MG chewable tablet, Chew 500 mg by mouth 3 (three) times daily with meals., Disp: , Rfl:    TRESIBA FLEXTOUCH 100 UNIT/ML  FlexTouch Pen, Inject 22 Units into the skin at bedtime., Disp: 30 mL, Rfl: 1  Social History   Tobacco Use  Smoking Status Never  Smokeless Tobacco Never    Allergies  Allergen Reactions   Tramadol Nausea And Vomiting   Objective:  There were no vitals filed for this visit. There is no height or weight on file to calculate BMI. Constitutional Well developed. Well nourished.  Vascular Dorsalis pedis pulses faintly palpable bilaterally. Posterior tibial pulses faintly palpable bilaterally. Capillary refill normal to all digits.  No cyanosis or clubbing noted. Pedal hair growth normal.  Neurologic Normal speech. Oriented to person, place, and time. Protective sensation absent  Dermatologic Wound Location: Right hallux IPJ probing down to bone. No clinical signs of infection including erythema/cellulitis noted. Wound Base: Mixed Granular/Fibrotic Peri-wound: Macerated Exudate: Scant/small amount Serous exudate Wound Measurements: -See below  Orthopedic: No pain to palpation either foot.   Radiographs: None Assessment:   1. Skin ulcer of right great toe with fat layer exposed ([place])   2. Vascular abnormality   3. Diabetic polyneuropathy associated with type 2 diabetes mellitus ([place])    Plan:  Patient was evaluated and treated and all questions answered.  Ulcer right hallux nail probing down to bone -Debridement as below. -Dressed with Betadine wet-to-dry, DSD. -Continue off-loading with surgical shoe. -Patient will go back to surgical shoe.  New surgical shoe was dispensed. -This seems to have gotten worse.  Now the wound does probe directly down to the wound has significantly regressed.  Radiographically there is no cortical destruction noted.  And therefore I believe patient would benefit from an MRI evaluation is to assess osteomyelitis -Patient may also benefit from ABIs.  To assess the vascular flow to the lower extremity.  Patient does have some component of  vascular abnormality. -Patient is a high risk of losing the digit.  I discussed this with patient extensive detail he states understanding.  Procedure: Excisional Debridement of Wound Tool: Sharp chisel blade/tissue nipper Rationale: Removal of non-viable soft tissue from the wound to promote healing.  Anesthesia: none Pre-Debridement Wound Measurements: 0.8 cm x 0.3 cm x 0.4 cm  Post-Debridement Wound Measurements: 0.9 cm x 0.3 cm x 0.5 cm  Type of Debridement: Sharp Excisional Tissue Removed: Non-viable soft tissue Blood loss: Minimal (<50cc) Depth of Debridement: subcutaneous tissue. Technique: Sharp excisional debridement to bleeding, viable wound base.  Wound Progress: The wound is regressing and becoming more deeper down to bone now. Dressing: Dry, sterile, compression dressing. Disposition: Patient tolerated procedure well. Patient to return in 1 week for follow-up.  No follow-ups on file.

## 2021-01-30 ENCOUNTER — Ambulatory Visit (HOSPITAL_COMMUNITY)
Admission: RE | Admit: 2021-01-30 | Discharge: 2021-01-30 | Disposition: A | Discharge status: Home / Self Care | Payer: Medicare Other | Source: Ambulatory Visit | Attending: Podiatry | Admitting: Podiatry

## 2021-01-30 ENCOUNTER — Other Ambulatory Visit: Payer: Self-pay

## 2021-01-30 DIAGNOSIS — I999 Unspecified disorder of circulatory system: Secondary | ICD-10-CM | POA: Diagnosis present

## 2021-01-31 ENCOUNTER — Other Ambulatory Visit: Payer: Self-pay | Admitting: Podiatry

## 2021-01-31 DIAGNOSIS — L97512 Non-pressure chronic ulcer of other part of right foot with fat layer exposed: Secondary | ICD-10-CM

## 2021-02-01 ENCOUNTER — Telehealth: Payer: Self-pay | Admitting: Podiatry

## 2021-02-01 ENCOUNTER — Ambulatory Visit
Admission: RE | Admit: 2021-02-01 | Discharge: 2021-02-01 | Disposition: A | Discharge status: Home / Self Care | Payer: Medicare Other | Source: Ambulatory Visit | Attending: Podiatry | Admitting: Podiatry

## 2021-02-01 DIAGNOSIS — L97512 Non-pressure chronic ulcer of other part of right foot with fat layer exposed: Secondary | ICD-10-CM

## 2021-02-01 NOTE — Telephone Encounter (Signed)
Please call back for status report on patient.

## 2021-02-05 NOTE — Progress Notes (Signed)
[facility] Note  02/08/2021     CHIEF COMPLAINT Patient presents for Retina Follow Up   HISTORY OF PRESENT ILLNESS: Randy Oneal is a 55 y.o. male who presents to the clinic today for:  HPI     Retina Follow Up   Patient presents with  Diabetic Retinopathy.  In both eyes.  Duration of 5 weeks.  Since onset it is stable.  I, the attending physician,  performed the HPI with the patient and updated documentation appropriately.        Comments   5 week follow up [place] may be a little better states pt. Pt states he has been on Prolensa QID and Brimonidine TID OU.  He ran out of both on Wednesday.  States he is not using Prednisolone.  BS 141 yesterday morning A1C 7.4      Last edited by Bernarda Caffey, MD on 02/08/2021 10:14 PM.    Pt states next Wednesday, he is going to [facility] to have  vascular testing related to his kidneys, he states he has also been told that he is a good candidate for a pancreatic transplant, pt feels like vision is stable, he states he ran out of both PF and Prolensa on Wed and wanted to wait until today's visit before re-filling them  Referring physician: [name], MD [address] [place],  [place] [address]  HISTORICAL INFORMATION:   Selected notes from the MEDICAL RECORD NUMBER Referred by Dr.Mark Gershon Crane for concern of decreased vision post cataract sx   CURRENT MEDICATIONS: Current Outpatient Medications (Ophthalmic Drugs)  Medication Sig   Bromfenac Sodium (PROLENSA) 0.07 % SOLN Place 1 drop into both eyes 4 (four) times daily.   brimonidine (ALPHAGAN) 0.2 % ophthalmic solution Place 1 drop into both eyes daily. (Patient not taking: No sig reported)   prednisoLONE acetate (PRED FORTE) 1 % ophthalmic suspension Place 1 drop into both eyes 4 (four) times daily.   No current facility-administered medications for this visit. (Ophthalmic Drugs)   Current Outpatient Medications (Other)   Medication Sig   acetaminophen (TYLENOL) 500 MG tablet Take 1,000 mg by mouth every 6 (six) hours as needed for moderate pain or headache.   amLODipine (NORVASC) 2.5 MG tablet TAKE ONE TABLET BY MOUTH ONCE DAILY.   atorvastatin (LIPITOR) 10 MG tablet Take 1 tablet (10 mg total) by mouth daily.   calcium acetate (PHOSLO) 667 MG capsule Take 1,334 mg by mouth 3 (three) times daily with meals.   Calcium Acetate 668 (169 Ca) MG TABS TAKE 2 TABLETS BY MOUTH THREE TIMES DAILY WITH MEALS   cinacalcet (SENSIPAR) 30 MG tablet Take by mouth.   Darbepoetin Alfa (ARANESP) 60 MCG/0.3ML SOSY injection Inject 0.3 mLs (60 mcg total) into the vein every Monday with hemodialysis.   famotidine (PEPCID) 40 MG tablet Take 0.5 tablets (20 mg total) by mouth at bedtime.   insulin lispro (HUMALOG KWIKPEN) 100 UNIT/ML KwikPen Inject 7 Units into the skin daily with breakfast.   lidocaine-prilocaine (EMLA) cream SMARTSIG:Sparingly Topical 3 Times a Week   Methoxy PEG-Epoetin Beta (MIRCERA IJ) Mircera   metoprolol tartrate (LOPRESSOR) 25 MG tablet Take 1 tablet (25 mg total) by mouth 2 (two) times daily.   nystatin (MYCOSTATIN/NYSTOP) powder Apply topically 2 (two) times daily. To groin and scrotum (Patient taking differently: Apply 1 g topically 2 (two) times daily as needed (rash). To groin and scrotum)   pantoprazole (PROTONIX) 40 MG tablet  Take by mouth.   sucroferric oxyhydroxide (VELPHORO) 500 MG chewable tablet Chew 500 mg by mouth 3 (three) times daily with meals.   TRESIBA FLEXTOUCH 100 UNIT/ML FlexTouch Pen Inject 22 Units into the skin at bedtime.   Continuous Blood Gluc Sensor (DEXCOM G6 SENSOR) MISC 1 Device by Does not apply route as directed.   Continuous Blood Gluc Transmit (DEXCOM G6 TRANSMITTER) MISC 1 Device by Does not apply route as directed.   glucose blood (ONETOUCH ULTRA) test strip 1 each by Other route in the morning, at noon, in the evening, and at bedtime. Use as instructed   Insulin Pen  Needle 32G X 4 MM MISC 1 Device by Does not apply route in the morning, at noon, in the evening, and at bedtime.   MICROLET LANCETS MISC USE FOUR TIMES A DAY AS DIRECTED.   No current facility-administered medications for this visit. (Other)      REVIEW OF SYSTEMS: ROS   Positive for: Genitourinary, Endocrine, Eyes Negative for: Constitutional, Gastrointestinal, Neurological, Skin, Musculoskeletal, HENT, Cardiovascular, Respiratory, Psychiatric, Allergic/Imm, Heme/Lymph Last edited by Leonie Douglas, COA on 02/08/2021 10:22 AM.        ALLERGIES Allergies  Allergen Reactions   Tramadol Nausea And Vomiting    PAST MEDICAL HISTORY Past Medical History:  Diagnosis Date   Anemia    Cataract 07/02/2020   Diabetes mellitus    Type 2    Diabetic retinopathy of both eyes ([place]) 01/31/2019   Dr Coralyn Pear 01/2019   ESRD (end stage renal disease) ([place])    [place] Frenisus   Herpes simplex virus (HSV) infection    OU   Hypertension    Hypertensive retinopathy    OU   Retinal detachment    OS   Sepsis due to Streptococcus, group B ([place]) 10/05/2018   Past Surgical History:  Procedure Laterality Date   25 GAUGE PARS PLANA VITRECTOMY WITH 20 GAUGE MVR PORT FOR MACULAR HOLE Right 02/24/2019   Procedure: 25 GAUGE PARS PLANA VITRECTOMY WITH 20 GAUGE MVR PORT FOR MACULAR HOLE;  Surgeon: Bernarda Caffey, MD;  Location: [facility];  Service: Ophthalmology;  Laterality: Right;   APPLICATION OF WOUND VAC Left 08/27/2018   Procedure: APPLICATION OF WOUND VAC, left neck;  Surgeon: Gaye Pollack, MD;  Location: [facility];  Service: Thoracic;  Laterality: Left;   APPLICATION OF WOUND VAC Left 08/30/2018   Procedure: APPLICATION OF WOUND VAC;  Surgeon: Gaye Pollack, MD;  Location: [facility];  Service: Thoracic;  Laterality: Left;   AV FISTULA PLACEMENT Left 02/15/2020   Procedure: LEFT ARM ARTERIOVENOUS (AV) FISTULA CREATION;  Surgeon: Waynetta Sandy, MD;  Location: [facility];  Service: Vascular;   Laterality: Left;   CATARACT EXTRACTION Bilateral    CATARACT EXTRACTION W/ INTRAOCULAR LENS  IMPLANT, BILATERAL     COLONOSCOPY  06/24/2011   Procedure: COLONOSCOPY;  Surgeon: [name], MD;  [facility];  Service: Endoscopy;  Laterality: N/A;  8:30 AM   EYE SURGERY     GAS/FLUID EXCHANGE Left 03/31/2019   Procedure: Gas/Fluid Exchange;  Surgeon: Bernarda Caffey, MD;  Location: [facility];  Service: Ophthalmology;  Laterality: Left;   INJECTION OF SILICONE OIL  7/41/2878   Procedure: Injection Of Silicone Oil;  Surgeon: Bernarda Caffey, MD;  Location: [facility];  Service: Ophthalmology;;   INSERTION OF DIALYSIS CATHETER Right 12/05/2019   Procedure: INSERTION OF DIALYSIS CATHETER RIGHT SUBCLAVIAN;  Surgeon: Virl Cagey, MD;  [facility]: [facility];  Service: General;  Laterality: Right;   INSERTION OF DIALYSIS CATHETER Right 12/07/2019   Procedure: Minor Dialysis catheter in place- need to reposition/ adjust catheter to help with flows;  Surgeon: Virl Cagey, MD;  Location: [facility];  Service: General;  Laterality: Right;  procedure room case with 1% lidocaine, full sterile drape,  towels, full gown, large chloraprep, 4-0 Monocryl and dermabond    INSERTION OF DIALYSIS CATHETER Right 12/08/2019   Procedure: INSERTION OF DIALYSIS CATHETER EXCHANGE;  Surgeon: Virl Cagey, MD;  [facility]: [facility];  Service: General;  Laterality: Right;   IR RADIOLOGIST EVAL & MGMT  02/07/2021   MEMBRANE PEEL Right 02/24/2019   Procedure: Antoine Primas;  Surgeon: Bernarda Caffey, MD;  Location: [facility];  Service: Ophthalmology;  Laterality: Right;   PARS PLANA VITRECTOMY Left 03/31/2019   Procedure: PARS PLANA VITRECTOMY WITH 25 GAUGE WITH MEMBRANE PEEL;  Surgeon: Bernarda Caffey, MD;  Location: [facility];  Service: Ophthalmology;  Laterality: Left;   PHOTOCOAGULATION WITH LASER Right 02/24/2019   Procedure: Photocoagulation With Laser;  Surgeon: Bernarda Caffey, MD;  Location: [facility];  Service: Ophthalmology;   Laterality: Right;   PHOTOCOAGULATION WITH LASER Left 03/31/2019   Procedure: Photocoagulation With Laser;  Surgeon: Bernarda Caffey, MD;  Location: [facility];  Service: Ophthalmology;  Laterality: Left;   RETINAL DETACHMENT SURGERY Left 03/31/2019   TRD Repair - Dr. Bernarda Caffey   SILICON OIL REMOVAL Right 2/59/5638   Procedure: Silicon Oil Removal;  Surgeon: Bernarda Caffey, MD;  Location: [facility];  Service: Ophthalmology;  Laterality: Right;   STERNAL WOUND DEBRIDEMENT Left 08/27/2018   Procedure: Incision and DEBRIDEMENT Left Chest, Neck and Mediastinum;  Surgeon: Gaye Pollack, MD;  [facility];  Service: Thoracic;  Laterality: Left;   STERNAL WOUND DEBRIDEMENT Left 08/30/2018   Procedure: WOUND VAC CHANGE, LEFT CHEST AND NECK, POSSIBLE DEBRIDEMENT;  Surgeon: Gaye Pollack, MD;  Location[facility];  Service: Thoracic;  Laterality: Left;    FAMILY HISTORY Family History  Problem Relation Age of Onset   Hypertension Mother    Colon cancer Neg Hx     SOCIAL HISTORY Social History   Tobacco Use   Smoking status: Never   Smokeless tobacco: Never  Vaping Use   Vaping Use: Never used  Substance Use Topics   Alcohol use: No   Drug use: No         OPHTHALMIC EXAM:  Base Eye Exam     Visual Acuity (Snellen - Linear)       Right Left   Dist [place] 20/250 20/300   Dist ph [place] 20/150 20/250 +1         Tonometry (Tonopen, 10:33 AM)       Right Left   Pressure 17 16         Pupils       Shape React APD   Right Irregular Minimal None   Left Irregular Minimal None         Visual Fields (Counting fingers)       Left Right     Full   Restrictions Partial outer superior temporal, inferior temporal, inferior nasal deficiencies          Extraocular Movement       Right Left    Full Full         Neuro/Psych     Oriented x3: Yes   Mood/Affect: Normal         Dilation     Both eyes: 1.0%  Mydriacyl, 2.5% Phenylephrine @ 10:33 AM           Slit Lamp  and Fundus Exam     Slit Lamp Exam       Right Left   Lids/Lashes mild Meibomian gland dysfunction mild Meibomian gland dysfunction   Conjunctiva/Sclera subconj silicon oil bubbles, Chemosis, conj Cyst White and quiet   Cornea Trace PEE, Well healed cataract wound, arcus, Debris in tear film Trace PEE, Well healed cataract wound, arcus, Debris in tear film   Anterior Chamber Deep and quiet, no cell or flare Deep and quiet, no cell or flare   Iris slightly irregular dilation, posterior synchiae from 3:00-1200 round, focal Posterior synechiae at 1030   Lens PC IOL in good position, pigment deposition, 1+ PCO, mild pigment deposition on optic PC IOL in good position, mild pigment deposition   Vitreous Post vit, silicon oil bubble ~50% post vitrectomy, good silicone oil fill         Fundus Exam       Right Left   Disc 2+pallor, sharp rim, mild fibrosis along ST rim +pallor, sharp rim, fine NVD, mild fibrosis   C/D Ratio 0.2 0.2   Macula attached under oil, scattered MA/IRH, nasal and central thickening / edema  attached under oil, +fibrosis/edema -- scattered IRH - slightly icnreased, focal bands of PRF with traction emanating from ST macula -- stable, cluster of DBH inferior mac -- improved   Vessels attenuated, Tortuous Attenuated, dilated venules, Tortuous, early NVE IT arcades   Periphery Attached, dense 360 PRP laser, mild scattered fibrosis - persistent, Focal ret hole along distal IT arcades--good laser surrounding Attached, good 360 PRP laser changes, pre-retinal fibrosis extending to posterior PRP border superior and inferiorly            IMAGING AND PROCEDURES  Imaging and Procedures for _0 @  OCT, Retina - OU - Both Eyes       Right Eye Quality was good. Central Foveal Thickness: 757. Progression has been stable. Findings include preretinal fibrosis, abnormal foveal contour, outer retinal atrophy, epiretinal membrane, intraretinal fluid, no SRF (Persistent IRF --  slightly increased ).   Left Eye Quality was good. Central Foveal Thickness: 689. Progression has worsened. Findings include preretinal fibrosis, intraretinal fluid, abnormal foveal contour, intraretinal hyper-reflective material, epiretinal membrane, no SRF (Interval increase in IRF and retinal thickness).   Notes *Images captured and stored on drive  Diagnosis / Impression:  +DME OU OD: Persistent IRF -- slightly increased  OS: Interval increase in IRF and retinal thickness  Clinical management:  See below  Abbreviations: NFP - Normal foveal profile. CME - cystoid macular edema. PED - pigment epithelial detachment. IRF - intraretinal fluid. SRF - subretinal fluid. EZ - ellipsoid zone. ERM - epiretinal membrane. ORA - outer retinal atrophy. ORT - outer retinal tubulation. SRHM - subretinal hyper-reflective material      Intravitreal Injection, Pharmacologic Agent - OD - Right Eye       Time Out 02/08/2021. 11:08 AM. Confirmed correct patient, procedure, site, and patient consented.   Anesthesia Topical anesthesia was used. Anesthetic medications included Lidocaine 2%, Proparacaine 0.5%.   Procedure Preparation included eyelid speculum, 5% betadine to ocular surface. A (32g) needle was used.   Injection: 2 mg aflibercept 2 MG/0.05ML   Route: Intravitreal, Site: Right Eye   NDC: A3590391, Lot: [id number], Expiration date: 12/08/2021, Waste: 0.05 mL   Post-op Post injection exam found visual acuity of at least counting fingers. The patient tolerated the procedure well.  There were no complications. The patient received written and verbal post procedure care education. Post injection medications were not given.      Intravitreal Injection, Pharmacologic Agent - OS - Left Eye       Time Out 02/08/2021. 11:08 AM. Confirmed correct patient, procedure, site, and patient consented.   Anesthesia Topical anesthesia was used. Anesthetic medications included Lidocaine 2%,  Proparacaine 0.5%.   Procedure Preparation included 5% betadine to ocular surface, eyelid speculum. A (32g) needle was used.   Injection: 2 mg aflibercept 2 MG/0.05ML   Route: Intravitreal, Site: Left Eye   NDC: A3590391, Lot: [id number], Expiration date: 10/08/2021, Waste: 0.05 mL   Post-op Post injection exam found visual acuity of at least counting fingers. The patient tolerated the procedure well. There were no complications. The patient received written and verbal post procedure care education. Post injection medications were not given.               ASSESSMENT/PLAN:    ICD-10-CM   1. Proliferative diabetic retinopathy of both eyes with macular edema associated with type 2 diabetes mellitus (HCC)  Q94.7654 Intravitreal Injection, Pharmacologic Agent - OD - Right Eye    Intravitreal Injection, Pharmacologic Agent - OS - Left Eye    aflibercept (EYLEA) SOLN 2 mg    aflibercept (EYLEA) SOLN 2 mg    2. Retinal detachment, tractional, bilateral  H33.43     3. Vitreous hemorrhage of both eyes ([place])  H43.13     4. Retinal edema  H35.81 OCT, Retina - OU - Both Eyes    5. Essential hypertension  I10     6. Hypertensive retinopathy of both eyes  H35.033     7. Pseudophakia of both eyes  Z96.1      1-4. Proliferative diabetic retinopathy with DME, TRD, and vitreous hemorrhage OU (OD > OS)  - last A1c: 8.5 on 03.02.22  - lost to f/u from Jan 2021 to Oct 2021 due to loss of insurance coverage  - pt with complex medical history with hospitalization in Jan-Feb 2020 for bacteremia and abscess  - history of poor glycemic control for years  - s/p IVA OD #1 (06.20.20), #2 (07.01.20), #3 (09.11.20), #4 (10.09.20), #5 (11.06.20), #6 (11.24.21), #7 (12.22.21), #8 (01.19.22)  - s/p IVA OS #1 (06.20.20), #2 (07.01.20), #3 (08.13.20), #4 (09.11.20)  #5 (11.06.20), #6 (11.24.21), #7 (12.22.21), #8 (01.19.22)  - s/p IVE OU #1 (12.04.20) - sample; #2 (01.06.21), #3 (02.18.22), #4  (03.18.22), #5 (04.18.22), #6 (05.25.22)  - s/p STK OS #1 (10.09.20)  - s/p PRP OS (06.10.20)  - s/p PPV/PFC/EL/FAX/silicone oil OD, 65.03.54  - s/p subconj silicon oil removal OD 8.20.20  - s/p PPV/EL/FAX/silicone oil OS, 65.68.12  - BCVA 20/150 (stable OD) , 20/250 (decreased OS)             - OCT: OD: Persistent IRF -- slightly increased ; OS: Interval increase in IRF and retinal thickness  - recommend IVE OU #7 for DME today, 07.01.22  - RBA of procedure discussed, questions answered  - informed consent obtained and signed  - see procedure note             - Eylea benefits investigation begun 01.19.22 -- approved  - discussed possible need for PPV w/ membrane peel and silicon oil exchange OS due to worsening fibrosis  - cont topical PF and Prolensa QID OU for possible CME component -- pt ran out on Wed, 06.29.22, pt to re-start - given sample  of Prolensa in office today  - f/u 4 weeks -- DFE/OCT, possible injection  5,6. Hypertensive retinopathy OU  - discussed importance of tight BP control  - monitor  7. Pseudophakia OU  - s/p CE with IOL Gershon Crane)  Ophthalmic Meds Ordered this visit:  Meds ordered this encounter  Medications   prednisoLONE acetate (PRED FORTE) 1 % ophthalmic suspension    Sig: Place 1 drop into both eyes 4 (four) times daily.    Dispense:  15 mL    Refill:  3   aflibercept (EYLEA) SOLN 2 mg   aflibercept (EYLEA) SOLN 2 mg       Return in about 4 weeks (around 03/08/2021) for f/u PDR OU, DFE, OCT.  There are no Patient Instructions on file for this visit.  This document serves as a record of services personally performed by Gardiner Sleeper, MD, PhD. It was created on their behalf by San Jetty. Owens Shark, OA an ophthalmic technician. The creation of this record is the provider's dictation and/or activities during the visit.    Electronically signed by: San Jetty. Marguerita Merles 06.28.2022 10:29 PM   Gardiner Sleeper, M.D., Ph.D. Diseases & Surgery of the Retina  and Vitreous [facility]  I have reviewed the above documentation for accuracy and completeness, and I agree with the above. Gardiner Sleeper, M.D., Ph.D. 02/08/21 10:29 PM   Abbreviations: M myopia (nearsighted); A astigmatism; H hyperopia (farsighted); P presbyopia; Mrx spectacle prescription;  CTL contact lenses; OD right eye; OS left eye; OU both eyes  XT exotropia; ET esotropia; PEK punctate epithelial keratitis; PEE punctate epithelial erosions; DES dry eye syndrome; MGD meibomian gland dysfunction; ATs artificial tears; PFAT's preservative free artificial tears; [place] nuclear sclerotic cataract; PSC posterior subcapsular cataract; ERM epi-retinal membrane; PVD posterior vitreous detachment; RD retinal detachment; DM diabetes mellitus; DR diabetic retinopathy; NPDR non-proliferative diabetic retinopathy; PDR proliferative diabetic retinopathy; CSME clinically significant macular edema; DME diabetic macular edema; dbh dot blot hemorrhages; CWS cotton wool spot; POAG primary open angle glaucoma; C/D cup-to-disc ratio; HVF humphrey visual field; GVF goldmann visual field; OCT optical coherence tomography; IOP intraocular pressure; BRVO Branch retinal vein occlusion; CRVO central retinal vein occlusion; CRAO central retinal artery occlusion; BRAO branch retinal artery occlusion; RT retinal tear; SB scleral buckle; PPV pars plana vitrectomy; VH Vitreous hemorrhage; PRP panretinal laser photocoagulation; IVK intravitreal kenalog; VMT vitreomacular traction; MH Macular hole;  NVD neovascularization of the disc; NVE neovascularization elsewhere; AREDS age related eye disease study; ARMD age related macular degeneration; POAG primary open angle glaucoma; EBMD epithelial/anterior basement membrane dystrophy; ACIOL anterior chamber intraocular lens; IOL intraocular lens; PCIOL posterior chamber intraocular lens; Phaco/IOL phacoemulsification with intraocular lens placement; [place] photorefractive  keratectomy; LASIK laser assisted in situ keratomileusis; HTN hypertension; DM diabetes mellitus; COPD chronic obstructive pulmonary disease

## 2021-02-07 ENCOUNTER — Encounter: Payer: Self-pay | Admitting: *Deleted

## 2021-02-07 ENCOUNTER — Other Ambulatory Visit: Payer: Self-pay

## 2021-02-07 ENCOUNTER — Ambulatory Visit
Admission: RE | Admit: 2021-02-07 | Discharge: 2021-02-07 | Disposition: A | Discharge status: Home / Self Care | Payer: Medicare Other | Source: Ambulatory Visit | Attending: Podiatry | Admitting: Podiatry

## 2021-02-07 DIAGNOSIS — L97512 Non-pressure chronic ulcer of other part of right foot with fat layer exposed: Secondary | ICD-10-CM

## 2021-02-07 HISTORY — PX: IR RADIOLOGIST EVAL & MGMT: IMG5224

## 2021-02-07 NOTE — Consult Note (Signed)
Chief Complaint: Right great toe wound  Referring Physician(s): Patel,Kevin P  History of Present Illness: Randy Oneal is a 55 y.o. male presenting to [facility] today as a scheduled consultation, kindly referred by Dr. Posey Pronto for evaluation of right great toe wound and candidacy of possible revascularization.   Mr Sansoucie joins [place] today virtually, with conflict with his HD schedule and COVID situation.  We confirmed his identity with 2 personal identifiers. His wife is on the call with him today.   He tells me that he has had a problem with a recurring wound/callous at the right great toe on and off for several months now.  He routinely gets his foot care with Dr. Serita Grit office, and after the last time he had some debridement on the toe there is some concern for healing.    He tells me that he has never had a problem here before, but it looks as if he had prior MRI/plain film imaging performed about 2 years ago for similar problem.    He denies any pain in the foot waking him at night.  I cannot elicit any history of claudication symptoms.  He does try and walk routinely.  In fact, he says that walking for 45 minutes to an hour has potentially resulted in some of the callous problems on the right great toe.   He denies prior MI or stroke.  No resting chest pain.   Noninvasive exam performed 01/30/21 Right ABI: Non-compressible Left ABI: non-compressible.    He is a never smoker.  He has been on HD treatments for about 1 yr.    He had MRI of the right foot completed 02/01/21 with results that are concerning for early osteomyelitis of the right hallux.   Past Medical History:  Diagnosis Date   Anemia    Cataract 07/02/2020   Diabetes mellitus    Type 2    Diabetic retinopathy of both eyes ([place]) 01/31/2019   Dr Coralyn Pear 01/2019   ESRD (end stage renal disease) ([place])    [place] Frenisus   Herpes simplex virus (HSV) infection    OU   Hypertension    Hypertensive  retinopathy    OU   Retinal detachment    OS   Sepsis due to Streptococcus, group B ([place]) 10/05/2018    Past Surgical History:  Procedure Laterality Date   25 GAUGE PARS PLANA VITRECTOMY WITH 20 GAUGE MVR PORT FOR MACULAR HOLE Right 02/24/2019   Procedure: 25 GAUGE PARS PLANA VITRECTOMY WITH 20 GAUGE MVR PORT FOR MACULAR HOLE;  Surgeon: Bernarda Caffey, MD;  Location: [facility];  Service: Ophthalmology;  Laterality: Right;   APPLICATION OF WOUND VAC Left 08/27/2018   Procedure: APPLICATION OF WOUND VAC, left neck;  Surgeon: Gaye Pollack, MD;  Location: [place];  Service: Thoracic;  Laterality: Left;   APPLICATION OF WOUND VAC Left 08/30/2018   Procedure: APPLICATION OF WOUND VAC;  Surgeon: Gaye Pollack, MD;  Location: [facility];  Service: Thoracic;  Laterality: Left;   AV FISTULA PLACEMENT Left 02/15/2020   Procedure: LEFT ARM ARTERIOVENOUS (AV) FISTULA CREATION;  Surgeon: Waynetta Sandy, MD;  Location: [facility];  Service: Vascular;  Laterality: Left;   CATARACT EXTRACTION Bilateral    CATARACT EXTRACTION W/ INTRAOCULAR LENS  IMPLANT, BILATERAL     COLONOSCOPY  06/24/2011   Procedure: COLONOSCOPY;  Surgeon: [name], MD;  [facility];  Service: Endoscopy;  Laterality: N/A;  8:30 AM  EYE SURGERY     GAS/FLUID EXCHANGE Left 03/31/2019   Procedure: Gas/Fluid Exchange;  Surgeon: Bernarda Caffey, MD;  Location: [facility];  Service: Ophthalmology;  Laterality: Left;   INJECTION OF SILICONE OIL  5/57/3220   Procedure: Injection Of Silicone Oil;  Surgeon: Bernarda Caffey, MD;  Location: [facility];  Service: Ophthalmology;;   INSERTION OF DIALYSIS CATHETER Right 12/05/2019   Procedure: INSERTION OF DIALYSIS CATHETER RIGHT SUBCLAVIAN;  Surgeon: Virl Cagey, MD;  [facility];  Service: General;  Laterality: Right;   INSERTION OF DIALYSIS CATHETER Right 12/07/2019   Procedure: Minor Dialysis catheter in place- need to reposition/ adjust catheter to help with flows;  Surgeon: Virl Cagey, MD;  Location[facility];  Service: General;  Laterality: Right;  procedure room case with 1% lidocaine, full sterile drape,  towels, full gown, large chloraprep, 4-0 Monocryl and dermabond    INSERTION OF DIALYSIS CATHETER Right 12/08/2019   Procedure: INSERTION OF DIALYSIS CATHETER EXCHANGE;  Surgeon: Virl Cagey, MD;  [facility];  Service: General;  Laterality: Right;   IR RADIOLOGIST EVAL & MGMT  02/07/2021   MEMBRANE PEEL Right 02/24/2019   Procedure: Antoine Primas;  Surgeon: Bernarda Caffey, MD;  Location: [place];  Service: Ophthalmology;  Laterality: Right;   PARS PLANA VITRECTOMY Left 03/31/2019   Procedure: PARS PLANA VITRECTOMY WITH 25 GAUGE WITH MEMBRANE PEEL;  Surgeon: Bernarda Caffey, MD;  Location: [facility];  Service: Ophthalmology;  Laterality: Left;   PHOTOCOAGULATION WITH LASER Right 02/24/2019   Procedure: Photocoagulation With Laser;  Surgeon: Bernarda Caffey, MD;  Location: [place];  Service: Ophthalmology;  Laterality: Right;   PHOTOCOAGULATION WITH LASER Left 03/31/2019   Procedure: Photocoagulation With Laser;  Surgeon: Bernarda Caffey, MD;  Location: [place];  Service: Ophthalmology;  Laterality: Left;   RETINAL DETACHMENT SURGERY Left 03/31/2019   TRD Repair - Dr. Bernarda Caffey   SILICON OIL REMOVAL Right 2/54/2706   Procedure: Silicon Oil Removal;  Surgeon: Bernarda Caffey, MD;  Location: [facility];  Service: Ophthalmology;  Laterality: Right;   STERNAL WOUND DEBRIDEMENT Left 08/27/2018   Procedure: Incision and DEBRIDEMENT Left Chest, Neck and Mediastinum;  Surgeon: Gaye Pollack, MD;  [facility];  Service: Thoracic;  Laterality: Left;   STERNAL WOUND DEBRIDEMENT Left 08/30/2018   Procedure: WOUND VAC CHANGE, LEFT CHEST AND NECK, POSSIBLE DEBRIDEMENT;  Surgeon: Gaye Pollack, MD;  Location: [place] [facility];  Service: Thoracic;  Laterality: Left;    Allergies: Tramadol  Medications: Prior to Admission medications   Medication Sig Start Date End Date Taking? Authorizing  Provider  acetaminophen (TYLENOL) 500 MG tablet Take 1,000 mg by mouth every 6 (six) hours as needed for moderate pain or headache.    [provider]  amLODipine (NORVASC) 2.5 MG tablet TAKE ONE TABLET BY MOUTH ONCE DAILY. 10/18/20   [name], MD  atorvastatin (LIPITOR) 10 MG tablet Take 1 tablet (10 mg total) by mouth daily. 01/25/20   [name], MD  brimonidine (ALPHAGAN) 0.2 % ophthalmic solution Place 1 drop into both eyes daily. Patient not taking: No sig reported 06/13/19   Bernarda Caffey, MD  Bromfenac Sodium (PROLENSA) 0.07 % SOLN Place 1 drop into both eyes 4 (four) times daily. 09/28/20   Bernarda Caffey, MD  calcium acetate (PHOSLO) 667 MG capsule Take 1,334 mg by mouth 3 (three) times daily with meals.    [provider]  Calcium Acetate 668 (169 Ca) MG TABS TAKE 2 TABLETS BY  MOUTH THREE TIMES DAILY WITH MEALS 01/04/20   [provider]  cinacalcet (SENSIPAR) 30 MG tablet Take by mouth. 04/12/20   [provider]  Continuous Blood Gluc Sensor (DEXCOM G6 SENSOR) MISC 1 Device by Does not apply route as directed. 10/10/20   Shamleffer, Melanie Crazier, MD  Continuous Blood Gluc Transmit (DEXCOM G6 TRANSMITTER) MISC 1 Device by Does not apply route as directed. 10/10/20   Shamleffer, Melanie Crazier, MD  Darbepoetin Alfa (ARANESP) 60 MCG/0.3ML SOSY injection Inject 0.3 mLs (60 mcg total) into the vein every Monday with hemodialysis. 12/12/19   Barton Dubois, MD  famotidine (PEPCID) 40 MG tablet Take 0.5 tablets (20 mg total) by mouth at bedtime. 12/08/19   Barton Dubois, MD  glucose blood (ONETOUCH ULTRA) test strip 1 each by Other route in the morning, at noon, in the evening, and at bedtime. Use as instructed 10/10/20   Shamleffer, Melanie Crazier, MD  insulin lispro (HUMALOG KWIKPEN) 100 UNIT/ML KwikPen Inject 7 Units into the skin daily with breakfast. 07/12/20   Shamleffer, Melanie Crazier, MD  Insulin Pen Needle 32G X 4 MM MISC 1 Device by Does not apply  route in the morning, at noon, in the evening, and at bedtime. 07/12/20   Shamleffer, Melanie Crazier, MD  lidocaine-prilocaine (EMLA) cream SMARTSIG:Sparingly Topical 3 Times a Week 05/21/20   [provider]  Methoxy PEG-Epoetin Beta (MIRCERA IJ) Mircera 02/14/20 02/12/21  [provider]  metoprolol tartrate (LOPRESSOR) 25 MG tablet Take 1 tablet (25 mg total) by mouth 2 (two) times daily. 12/08/19   Barton Dubois, MD  MICROLET LANCETS MISC USE FOUR TIMES A DAY AS DIRECTED. 11/19/16   [name], MD  nystatin (MYCOSTATIN/NYSTOP) powder Apply topically 2 (two) times daily. To groin and scrotum Patient taking differently: Apply 1 g topically 2 (two) times daily as needed (rash). To groin and scrotum 09/04/18   Barrett, Erin R, PA-C  pantoprazole (PROTONIX) 40 MG tablet Take by mouth. 10/14/18   [provider]  prednisoLONE acetate (PRED FORTE) 1 % ophthalmic suspension Place 1 drop into both eyes 4 (four) times daily. 09/28/20   Bernarda Caffey, MD  sucroferric oxyhydroxide (VELPHORO) 500 MG chewable tablet Chew 500 mg by mouth 3 (three) times daily with meals.    [provider]  TRESIBA FLEXTOUCH 100 UNIT/ML FlexTouch Pen Inject 22 Units into the skin at bedtime. 07/12/20   Shamleffer, Melanie Crazier, MD     Family History  Problem Relation Age of Onset   Hypertension Mother    Colon cancer Neg Hx     Social History   Socioeconomic History   Marital status: Married    Spouse name: Not on file   Number of children: Not on file   Years of education: Not on file   Highest education level: Not on file  Occupational History   Not on file  Tobacco Use   Smoking status: Never   Smokeless tobacco: Never  Vaping Use   Vaping Use: Never used  Substance and Sexual Activity   Alcohol use: No   Drug use: No   Sexual activity: Not on file  Other Topics Concern   Not on file  Social History Narrative   Not on file   Social Determinants of Health    Financial Resource Strain: Not on file  Food Insecurity: Not on file  Transportation Needs: Not on file  Physical Activity: Not on file  Stress: Not on file  Social Connections: Not on file  Review of Systems  Review of Systems: A 12 point ROS discussed and pertinent positives are indicated in the HPI above.  All other systems are negative.  Physical Exam No direct physical exam was performed (except for noted visual exam findings with Video Visits).   Vital Signs: There were no vitals taken for this visit.  Imaging: MR FOOT RIGHT WO CONTRAST  Result Date: 02/01/2021 CLINICAL DATA:  Nonhealing diabetic foot ulcer of the right great toe EXAM: MRI OF THE RIGHT FOREFOOT WITHOUT CONTRAST TECHNIQUE: Multiplanar, multisequence MR imaging of the right forefoot was performed. No intravenous contrast was administered. COMPARISON:  MRI 08/18/2018 FINDINGS: Bones/Joint/Cartilage Mild subcortical marrow edema along the plantar medial aspect of the great toe distal phalanx and great toe proximal phalanx near the interphalangeal joint (series 11, image 6; series 14, images 11-15). Possible small developing erosions at this location (series 13, images 12-14). T1 marrow signal is otherwise preserved. No acute fracture or dislocation. 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 There is associated reactive marrow edema within the first metatarsal head. Marrow signal within the forefoot is otherwise unremarkable. Ligaments Intact Lisfranc ligament. No evidence of acute collateral ligament injury. Muscles and Tendons Diffuse edema-like signal throughout the intrinsic foot musculature which may reflect a combination of denervation and myositis. Tenosynovitis of the flexor hallucis longus tendon most pronounced at the level of the great toe proximal phalanx. Well-defined lobulated fluid collection abutting the lateral  margin of the FHL tendon sheath at the level of the great toe proximal phalanx measuring 13 x 3 x 8 mm (series 14, image 10; series 8, image 13), favored to reflect ganglion cyst. Soft tissues Soft tissue thickening with skin irregularity at the plantar medial soft tissues at the level of the great toe IP joint. IMPRESSION: 1. Mild subcortical marrow edema along the plantar-medial aspect of the great toe distal phalanx and great toe proximal phalanx near the interphalangeal joint. Possible small developing erosions at this location. Findings are favored to reflect early acute osteomyelitis given proximity to adjacent soft tissue wound/ulceration. 2. Tenosynovitis of the flexor hallucis longus tendon most pronounced at the level of the great toe proximal phalanx. Adjacent well-defined lobulated fluid collection along the lateral margin of the tendon sheath measuring up to 13 mm is favored to reflect a ganglion cyst. Small abscess not entirely excluded. 3. Diffuse edema-like signal throughout the intrinsic foot musculature which may reflect a combination of denervation and myositis. These results will be called to the ordering clinician or representative by the Radiologist Assistant, and communication documented in the PACS or [employer]. Electronically Signed   By: Davina Poke D.O.   On: 02/01/2021 14:11   DG Foot Complete Right  Result Date: 01/18/2021 Please see detailed radiograph report in office note.  VAS [place] ABI WITH/WO TBI  Result Date: 01/30/2021  LOWER EXTREMITY DOPPLER STUDY Patient Name:  JERAL ZICK Oneal  Date of Exam:   01/30/2021 Medical Rec #: [id number]         Accession #:    [id number] Date of Birth: 1966/01/31          Patient Gender: M Patient Age:   54Y Exam Location:  [facility] Procedure:      VAS [place] ABI WITH/WO TBI Referring Phys: [id number] KEVIN P PATEL --------------------------------------------------------------------------------  Indications: Ulceration,  and peripheral artery disease. High Risk Factors: Hyperlipidemia, Diabetes, past history of smoking. Other Factors: Patient  had callous removed several times from right great toe                and the wound has not healed.  Vascular Interventions: Dialysis access left arm. Performing Technologist: Delorise Shiner RVT  Examination Guidelines: A complete evaluation includes at minimum, Doppler waveform signals and systolic blood pressure reading at the level of bilateral brachial, anterior tibial, and posterior tibial arteries, when vessel segments are accessible. Bilateral testing is considered an integral part of a complete examination. Photoelectric Plethysmograph (PPG) waveforms and toe systolic pressure readings are included as required and additional duplex testing as needed. Limited examinations for reoccurring indications may be performed as noted.  ABI Findings: +---------+------------------+-----+---------+--------+ Right    Rt Pressure (mmHg)IndexWaveform Comment  +---------+------------------+-----+---------+--------+ Brachial 187                                      +---------+------------------+-----+---------+--------+ ATA      255               1.36                   +---------+------------------+-----+---------+--------+ PTA      255               1.36 triphasic         +---------+------------------+-----+---------+--------+ DP                              triphasic         +---------+------------------+-----+---------+--------+ Great Toe210               1.12                   +---------+------------------+-----+---------+--------+ +---------+------------------+-----+---------+---------------+ Left     Lt Pressure (mmHg)IndexWaveform Comment         +---------+------------------+-----+---------+---------------+ Brachial                                 Dialysis access +---------+------------------+-----+---------+---------------+ ATA      248                1.33                          +---------+------------------+-----+---------+---------------+ PTA      233               1.25 biphasic                 +---------+------------------+-----+---------+---------------+ DP                              triphasic                +---------+------------------+-----+---------+---------------+ Great Toe181               0.97                          +---------+------------------+-----+---------+---------------+ +-------+----------------+-----------+------------+------------+ ABI/TBIToday's ABI     Today's TBIPrevious ABIPrevious TBI +-------+----------------+-----------+------------+------------+ Right  Non compressible1.12                                +-------+----------------+-----------+------------+------------+  Left   Non compressible0.97                                +-------+----------------+-----------+------------+------------+ Arterial wall calcification precludes accurate ankle pressures and ABIs.  Summary: Right: Resting right ankle-brachial index indicates noncompressible right lower extremity arteries. The right toe-brachial index is normal. RT great toe pressure = 210 mmHg. Left: Resting left ankle-brachial index indicates noncompressible left lower extremity arteries. The left toe-brachial index is normal. LT Great toe pressure = 181 mmHg.  *See table(s) above for measurements and observations.  Electronically signed by Deitra Mayo MD on 01/30/2021 at 5:45:56 PM.    Final    IR Radiologist Eval & Mgmt  Result Date: 02/07/2021 Please refer to notes tab for details about interventional procedure. (Op Note)   Labs:  CBC: Recent Labs    02/15/20 0842  HGB 12.6*  HCT 37.0*    COAGS: No results for input(s): INR, APTT in the last 8760 hours.  BMP: Recent Labs    02/15/20 0842  NA 138  K 3.7  CL 94*  GLUCOSE 141*  BUN 42*  CREATININE 6.90*    LIVER FUNCTION TESTS: No results for  input(s): BILITOT, AST, ALT, ALKPHOS, PROT, ALBUMIN in the last 8760 hours.  TUMOR MARKERS: No results for input(s): AFPTM, CEA, CA199, CHROMGRNA in the last 8760 hours.  Assessment and Plan:  Assessment:  Mr Marbach is a 55yo male presenting with right great toe wound, compatible with diabetic foot wound/Rutherford 5 class symptoms of CLI.   Non-invasive lower extremity exam shows non-compressible ABI bilateral ankles.     I had a discussion with Mr Orlich and his wife regarding anatomy, pathology/pathophysiology, natural history, and prognosis of PAD/CLI.  Informed consent regarding treatment strategies was performed which would possibly include medical management,  surgical strategy, and/or endovascular options, with risk/benefit discussion.  The indications for treatment supported by updated guidelines1, 2 were discussed.  Given the presence of diabetes, healthy foot care was also discussed, in accord with multi-disciplinary, Class 1 recommendations.1,3       I did let him know the benefits of angiogram and possible treatment of any tibial/pedal stenosis/occlusion with endovascular options.   Specifically, this is performed to improve blood flow and healing capability in the setting of his diabetic foot wound, with every attempt to avoid an amputation in the future.    Regarding endovascular options, specific risks discussed include: bleeding, infection, contrast reaction, renal injury/nephropathy, arterial injury/dissection, need for additional procedure/surgery, worsening symptoms/tissue including limb loss, cardiopulmonary collapse, death.    Regarding medical management, maximal medical therapy for reduction of risk factors is indicated as recommended by updated AHA guidelines1.  This includes anti-platelet medication, tight blood glucose control to a HbA1c < 7, tight blood pressure control, maximum-dose HMG-CoA reductase inhibitor.   Currently, he is not taking any aspirin/anti-platelet  medication.   Annual flu vaccination is also recommended, with Class 1 recommendation1.   Plan: - After our discussion, he would like to follow up with Dr. Serita Grit office and review the recent MRI and any changes in his medical therapy.  He understands that we are recommending lower extremity angiogram and possible intervention for his right great toe wound.   - If he would like to proceed with angiogram/intervention, this can be arranged in the near future with Dr. Earleen Newport at [facility].    - Recommend continued maximal medical therapy for cardiovascular risk reduction, including anti-platelet therapy.  ___________________________________________________________________   1Morley Kos MD, et al. 2016 AHA/ACC Guideline on the Management of Patients With Lower Extremity Peripheral Artery Disease: Executive Summary: A Report of the American College of Cardiology/American Heart Association Task Force on Clinical Practice Guidelines. J Am Coll Cardiol. 2017 Mar 21;69(11):1465-1508. doi: 10.1016/j.jacc.2016.11.008.   2 - Norgren L, et al. TASC II Working Group. Inter-society consensus for the management of peripheral arterial disease. Int Tressia Miners. 2007 Jun;26(2):81-157. Review. PubMed PMID: 48546270  3 - Hingorani A, et al. The management of diabetic foot: A clinical practice guideline by the Society for Vascular Surgery in collaboration with the [place] and the Society  for Vascular Medicine. J Vasc Surg. 2016 Feb;63(2 Suppl):3S-21S. doi: 10.1016/j.jvs.2015.10.003. PubMed PMID: 35009381.  4 - Corinna Gab, Saab FA, Luberta Mutter, Grant Ruts, Ewell Poe, Driver VR, [place], Lookstein R, van den Baldemar Lenis, Jaff MR, Guadalupe Dawn, Henao S, AlMahameed A, Katzen B. Digital Subtraction Angiography Prior to an Amputation for Critical Limb Ischemia (CLI): An Expert Recommendation Statement From the CLI Global Society to Optimize Limb Salvage. J Endovasc Ther. 2020 Aug;27(4):540-546. doi:  10.1177/1526602820928590. Epub 2020 May 29. PMID: 82993716.    Thank you for this interesting consult.  I greatly enjoyed meeting Serigne Kubicek Oneal and look forward to participating in their care.  A copy of this report was sent to the requesting provider on this date.  Electronically Signed: Corrie Mckusick 02/07/2021, 4:32 PM   I spent a total of  60 Minutes   in remote  clinical consultation, greater than 50% of which was counseling/coordinating care for right diabetic foot ulcer, rutherford 5, possible angiogram/intervention.    Visit type: Audio only (telephone). Audio (no video) only due to patient's lack of internet/smartphone capability. Alternative for in-person consultation at [facility], [address], [place], [place]. This visit type was conducted due to national recommendations for restrictions regarding the COVID-19 Pandemic (e.g. social distancing).  This format is felt to be most appropriate for this patient at this time.  All issues noted in this document were discussed and addressed.

## 2021-02-08 ENCOUNTER — Other Ambulatory Visit: Payer: Self-pay

## 2021-02-08 ENCOUNTER — Ambulatory Visit (INDEPENDENT_AMBULATORY_CARE_PROVIDER_SITE_OTHER): Payer: Medicare Other | Admitting: Ophthalmology

## 2021-02-08 ENCOUNTER — Encounter (INDEPENDENT_AMBULATORY_CARE_PROVIDER_SITE_OTHER): Payer: Self-pay | Admitting: Ophthalmology

## 2021-02-08 DIAGNOSIS — H4313 Vitreous hemorrhage, bilateral: Secondary | ICD-10-CM | POA: Diagnosis not present

## 2021-02-08 DIAGNOSIS — H35033 Hypertensive retinopathy, bilateral: Secondary | ICD-10-CM

## 2021-02-08 DIAGNOSIS — H3581 Retinal edema: Secondary | ICD-10-CM | POA: Diagnosis not present

## 2021-02-08 DIAGNOSIS — Z961 Presence of intraocular lens: Secondary | ICD-10-CM

## 2021-02-08 DIAGNOSIS — I10 Essential (primary) hypertension: Secondary | ICD-10-CM

## 2021-02-08 DIAGNOSIS — E113513 Type 2 diabetes mellitus with proliferative diabetic retinopathy with macular edema, bilateral: Secondary | ICD-10-CM

## 2021-02-08 DIAGNOSIS — H3343 Traction detachment of retina, bilateral: Secondary | ICD-10-CM | POA: Diagnosis not present

## 2021-02-08 MED ORDER — PREDNISOLONE ACETATE 1 % OP SUSP: 1.0000 [drp] | Freq: Four times a day (QID) | OPHTHALMIC | 3 refills | Status: DC

## 2021-02-08 MED ORDER — AFLIBERCEPT 2MG/0.05ML IZ SOLN FOR KALEIDOSCOPE
2.0000 mg | INTRAVITREAL | Status: AC | PRN
  Administered 2021-02-08: 2 mg via INTRAVITREAL

## 2021-02-13 ENCOUNTER — Ambulatory Visit: Payer: Medicare Other | Admitting: Podiatry

## 2021-02-20 ENCOUNTER — Other Ambulatory Visit: Payer: Self-pay

## 2021-02-20 ENCOUNTER — Ambulatory Visit (INDEPENDENT_AMBULATORY_CARE_PROVIDER_SITE_OTHER): Payer: Medicare Other | Admitting: Podiatry

## 2021-02-20 ENCOUNTER — Ambulatory Visit: Payer: Medicare Other | Admitting: Internal Medicine

## 2021-02-20 DIAGNOSIS — E1142 Type 2 diabetes mellitus with diabetic polyneuropathy: Secondary | ICD-10-CM | POA: Diagnosis not present

## 2021-02-20 DIAGNOSIS — I999 Unspecified disorder of circulatory system: Secondary | ICD-10-CM

## 2021-02-20 DIAGNOSIS — L97512 Non-pressure chronic ulcer of other part of right foot with fat layer exposed: Secondary | ICD-10-CM

## 2021-02-26 ENCOUNTER — Telehealth: Payer: Self-pay | Admitting: Internal Medicine

## 2021-02-26 ENCOUNTER — Encounter: Payer: Self-pay | Admitting: Podiatry

## 2021-02-26 NOTE — Progress Notes (Signed)
Subjective:  Patient ID: Randy Oneal, male    DOB: 1966-02-25,  MRN: [id number]  Chief Complaint  Patient presents with   Wound Check    Right digit wound check. Pt denies fever/chills/nausea/vomiting. No new concerns.    55 y.o. male presents for wound care.  Patient presents with a follow-up of right hallux IPJ ulceration that may have reopened again.   patient states it has gotten bigger and worse.  Patient states that they wanted to discuss with me about the toe prior to undergoing angiogram if needed.  He had seen Dr. Earleen Newport who had recommend that he will benefit from an angiogram.  He denies any other acute complaints.  No purulent drainage.  He has been doing Betadine wet-to-dry dressing changes   Review of Systems: Negative except as noted in the HPI. Denies N/V/F/Ch.  Past Medical History:  Diagnosis Date   Anemia    Cataract 07/02/2020   Diabetes mellitus    Type 2    Diabetic retinopathy of both eyes ([place]) 01/31/2019   Dr Coralyn Pear 01/2019   ESRD (end stage renal disease) ([place])    [place] Frenisus   Herpes simplex virus (HSV) infection    OU   Hypertension    Hypertensive retinopathy    OU   Retinal detachment    OS   Sepsis due to Streptococcus, group B ([place]) 10/05/2018    Current Outpatient Medications:    acetaminophen (TYLENOL) 500 MG tablet, Take 1,000 mg by mouth every 6 (six) hours as needed for moderate pain or headache., Disp: , Rfl:    amLODipine (NORVASC) 2.5 MG tablet, TAKE ONE TABLET BY MOUTH ONCE DAILY., Disp: 30 tablet, Rfl: 0   amLODipine (NORVASC) 5 MG tablet, SMARTSIG:1 Tablet(s) By Mouth Every Evening, Disp: , Rfl:    atorvastatin (LIPITOR) 10 MG tablet, Take 1 tablet (10 mg total) by mouth daily., Disp: 30 tablet, Rfl: 5   brimonidine (ALPHAGAN) 0.2 % ophthalmic solution, Place 1 drop into both eyes daily., Disp: 10 mL, Rfl: 10   Bromfenac Sodium (PROLENSA) 0.07 % SOLN, Place 1 drop into both eyes 4 (four) times daily., Disp: 6 mL, Rfl: 10    calcium acetate (PHOSLO) 667 MG capsule, Take 1,334 mg by mouth 3 (three) times daily with meals., Disp: , Rfl:    Calcium Acetate 668 (169 Ca) MG TABS, TAKE 2 TABLETS BY MOUTH THREE TIMES DAILY WITH MEALS, Disp: , Rfl:    cinacalcet (SENSIPAR) 30 MG tablet, Take by mouth., Disp: , Rfl:    Continuous Blood Gluc Sensor (DEXCOM G6 SENSOR) MISC, 1 Device by Does not apply route as directed., Disp: 3 each, Rfl: 11   Continuous Blood Gluc Transmit (DEXCOM G6 TRANSMITTER) MISC, 1 Device by Does not apply route as directed., Disp: 1 each, Rfl: 3   Darbepoetin Alfa (ARANESP) 60 MCG/0.3ML SOSY injection, Inject 0.3 mLs (60 mcg total) into the vein every Monday with hemodialysis., Disp: , Rfl:    famotidine (PEPCID) 40 MG tablet, Take 0.5 tablets (20 mg total) by mouth at bedtime., Disp: 30 tablet, Rfl: 1   glucose blood (ONETOUCH ULTRA) test strip, 1 each by Other route in the morning, at noon, in the evening, and at bedtime. Use as instructed, Disp: 400 strip, Rfl: 3   insulin lispro (HUMALOG KWIKPEN) 100 UNIT/ML KwikPen, Inject 7 Units into the skin daily with breakfast., Disp: 30 mL, Rfl: 1   Insulin Pen Needle 32G X 4 MM MISC, 1 Device by Does not apply  route in the morning, at noon, in the evening, and at bedtime., Disp: 400 each, Rfl: 1   lidocaine-prilocaine (EMLA) cream, SMARTSIG:Sparingly Topical 3 Times a Week, Disp: , Rfl:    Methoxy PEG-Epoetin Beta (MIRCERA IJ), Mircera, Disp: , Rfl:    metoprolol tartrate (LOPRESSOR) 25 MG tablet, Take 1 tablet (25 mg total) by mouth 2 (two) times daily., Disp: 60 tablet, Rfl: 1   MICROLET LANCETS MISC, USE FOUR TIMES A DAY AS DIRECTED., Disp: 100 each, Rfl: 0   nystatin (MYCOSTATIN/NYSTOP) powder, Apply topically 2 (two) times daily. To groin and scrotum (Patient taking differently: Apply 1 g topically 2 (two) times daily as needed (rash). To groin and scrotum), Disp: 15 g, Rfl: 0   pantoprazole (PROTONIX) 40 MG tablet, Take by mouth., Disp: , Rfl:     prednisoLONE acetate (PRED FORTE) 1 % ophthalmic suspension, Place 1 drop into both eyes 4 (four) times daily., Disp: 15 mL, Rfl: 3   sucroferric oxyhydroxide (VELPHORO) 500 MG chewable tablet, Chew 500 mg by mouth 3 (three) times daily with meals., Disp: , Rfl:    TRESIBA FLEXTOUCH 100 UNIT/ML FlexTouch Pen, Inject 22 Units into the skin at bedtime., Disp: 30 mL, Rfl: 1  Social History   Tobacco Use  Smoking Status Never  Smokeless Tobacco Never    Allergies  Allergen Reactions   Tramadol Nausea And Vomiting   Objective:  There were no vitals filed for this visit. There is no height or weight on file to calculate BMI. Constitutional Well developed. Well nourished.  Vascular Dorsalis pedis pulses faintly palpable bilaterally. Posterior tibial pulses faintly palpable bilaterally. Capillary refill normal to all digits.  No cyanosis or clubbing noted. Pedal hair growth normal.  Neurologic Normal speech. Oriented to person, place, and time. Protective sensation absent  Dermatologic Wound Location: Right hallux IPJ probing down to bone. No clinical signs of infection including erythema/cellulitis noted. Wound Base: Mixed Granular/Fibrotic Peri-wound: Macerated Exudate: Scant/small amount Serous exudate Wound Measurements: -See below  Orthopedic: No pain to palpation either foot.   Radiographs: None Assessment:   1. Vascular abnormality   2. Diabetic polyneuropathy associated with type 2 diabetes mellitus ([place])   3. Skin ulcer of right great toe with fat layer exposed ([place])     Plan:  Patient was evaluated and treated and all questions answered.  Ulcer right hallux nail probing down to bone -Debridement as below. -Dressed with Betadine wet-to-dry, DSD. -Continue off-loading with surgical shoe. -Patient will go back to surgical shoe.  New surgical shoe was dispensed. -The MRI was reviewed with the patient which shows that there might be acute signs of early osteomyelitis  in the distal phalanx as well as the proximal phalanx near the interphalangeal joint.  This seems to clinically correlate with the wound.  Given these findings I will discuss possible amputation once restoration of the flow -ABIs PVRs were reviewed.  Patient was seen by Dr. Earleen Newport for angiogram.  I discussed with the patient that he will benefit from an angiogram.  He would like to schedule an appointment to see Dr. Earleen Newport again for angiogram.  I will reach back out to him. -Patient is a high risk of losing the digit.  I discussed this with patient extensive detail he states understanding.  Procedure: Excisional Debridement of Wound Tool: Sharp chisel blade/tissue nipper Rationale: Removal of non-viable soft tissue from the wound to promote healing.  Anesthesia: none Pre-Debridement Wound Measurements: 0.8 cm x 0.3 cm x 0.5 cm  Post-Debridement  Wound Measurements: 0.9 cm x 0.3 cm x 0.7 cm  Type of Debridement: Sharp Excisional Tissue Removed: Non-viable soft tissue Blood loss: Minimal (<50cc) Depth of Debridement: subcutaneous tissue. Technique: Sharp excisional debridement to bleeding, viable wound base.  Wound Progress: The wound is regressing and becoming more deeper down to bone now. Dressing: Dry, sterile, compression dressing. Disposition: Patient tolerated procedure well. Patient to return in 1 week for follow-up.  No follow-ups on file.

## 2021-02-26 NOTE — Telephone Encounter (Signed)
Patient's insurance no longer covers Humalog - asked if we could call in [facility] for him instead.  [place], [place] Phone:  [phone]  Fax:  [phone]

## 2021-02-27 MED ORDER — NOVOLOG FLEXPEN 100 UNIT/ML ~~LOC~~ SOPN: 7.0000 [IU] | PEN_INJECTOR | Freq: Three times a day (TID) | SUBCUTANEOUS | 3 refills | Status: DC

## 2021-02-27 NOTE — Telephone Encounter (Signed)
Please advise 

## 2021-03-01 ENCOUNTER — Encounter: Payer: Self-pay | Admitting: Internal Medicine

## 2021-03-01 ENCOUNTER — Other Ambulatory Visit: Payer: Self-pay

## 2021-03-01 ENCOUNTER — Ambulatory Visit (INDEPENDENT_AMBULATORY_CARE_PROVIDER_SITE_OTHER): Payer: Medicare Other | Admitting: Internal Medicine

## 2021-03-01 VITALS — BP 128/80 | HR 88 | Ht 71.0 in | Wt 192.0 lb

## 2021-03-01 DIAGNOSIS — E11319 Type 2 diabetes mellitus with unspecified diabetic retinopathy without macular edema: Secondary | ICD-10-CM | POA: Diagnosis not present

## 2021-03-01 DIAGNOSIS — E1142 Type 2 diabetes mellitus with diabetic polyneuropathy: Secondary | ICD-10-CM

## 2021-03-01 DIAGNOSIS — E1165 Type 2 diabetes mellitus with hyperglycemia: Secondary | ICD-10-CM

## 2021-03-01 DIAGNOSIS — E1122 Type 2 diabetes mellitus with diabetic chronic kidney disease: Secondary | ICD-10-CM

## 2021-03-01 DIAGNOSIS — N186 End stage renal disease: Secondary | ICD-10-CM

## 2021-03-01 DIAGNOSIS — Z794 Long term (current) use of insulin: Secondary | ICD-10-CM

## 2021-03-01 DIAGNOSIS — Z992 Dependence on renal dialysis: Secondary | ICD-10-CM

## 2021-03-01 NOTE — Patient Instructions (Addendum)
-   Keep up the Good Work ! - Continue  Tresiba  22 units at Bedtime - Change Novolog 7 units with Breakfast and Lunch and 8 units with Supper Novolog correctional insulin: ADD extra units on insulin to your meal-time Novolog dose if your blood sugars are higher than 170. Use the scale below to help guide you:   Blood sugar before meal Number of units to inject  Less than 170 0 unit  171 -  210 1 units  211 -  250 2 units  251 -  290 3 units  291 -  330 4 units  331 -  370 5 units       HOW TO TREAT LOW BLOOD SUGARS (Blood sugar LESS THAN 70 MG/DL) Please follow the RULE OF 15 for the treatment of hypoglycemia treatment (when your (blood sugars are less than 70 mg/dL)   STEP 1: Take 15 grams of carbohydrates when your blood sugar is low, which includes:  3-4 GLUCOSE TABS  OR 3-4 OZ OF JUICE OR REGULAR SODA OR ONE TUBE OF GLUCOSE GEL    STEP 2: RECHECK blood sugar in 15 MINUTES STEP 3: If your blood sugar is still low at the 15 minute recheck --> then, go back to STEP 1 and treat AGAIN with another 15 grams of carbohydrates. Decrease Tresiba to 24 units at bedtime Decrease Humalog to 7 units 3 times daily with meals,

## 2021-03-01 NOTE — Progress Notes (Signed)
Name: Randy Oneal  Age/ Sex: 55 y.o., male   MRN/ DOB: [id number], 12/14/1965     PCP: [name], MD   Reason for Endocrinology Evaluation: Type 2 Diabetes Mellitus  Initial Endocrine Consultative Visit: 10/20/2018    PATIENT IDENTIFIER: [name] is a 55 y.o. male with a past medical history of T2DM,HTN, and ESRD  . The patient has followed with Endocrinology clinic since  for consultative assistance with management of his diabetes.     DIABETIC HISTORY:  [name] was diagnosed with DM in 2000.  He has been on Metformin, historically his control has been poor due to non-compliance but pt became motivated after his prolonged hospitalization for chest wall infection in [place], 2020.  He has been on insulin therapy for years. His hemoglobin A1c has ranged from 13.0% in 08/2018, peaking at 16.8% in 2017    On his initial presentation to our clinic his A1c 13.0% and he was on Tresiba and Humalog.   SUBJECTIVE:   During the last visit (10/10/2020): A1c 8.5%  we  adjusted MDI regimen    Today (03/01/2021): [name] is here for a follow up on diabetes.  He is accompanied by his wife today. He checks his blood sugars 3 times daily, preprandial , he did not bring his meter today. The patient has not had hypoglycemic episodes since the last clinic visit   He is working towards getting on the transplant list and is motivated to control his diabetes.   Denies nausea or vomiting  Denies fever   He is having right large toe callous, follows with [facility]  Has been accepted for pancreatic/renal transplant     HOME DIABETES REGIMEN:  Tresiba 22 units daily  Novolog 7 units with each meal     Statin: Yes ACE-I/ARB: on ESRD    CONTINUOUS GLUCOSE MONITORING RECORD INTERPRETATION    Dates of Recording: 7/9-7/22/2022  Sensor description:dexcom  Results statistics:   CGM use % of time 36  Average and SD 174/61  Time in range   65     %  % Time Above 180 23   % Time above 250 12  % Time Below target 0      Glycemic patterns summary: BG's optimal except after lunch , noted hyperglycemia   Hyperglycemic episodes  post lunch   Hypoglycemic episodes occurred N/A  Overnight periods: trends down     DIABETIC COMPLICATIONS: Microvascular complications:  Retinopathy, neuropathy, ESRD Last Eye Exam: Completed 09/28/2020  Macrovascular complications:   Denies: CAD, CVA, PVD   HISTORY:  Past Medical History:  Past Medical History:  Diagnosis Date   Anemia    Cataract 07/02/2020   Diabetes mellitus    Type 2    Diabetic retinopathy of both eyes ([place]) 01/31/2019   Dr Coralyn Pear 01/2019   ESRD (end stage renal disease) ([place])    [place] Frenisus   Herpes simplex virus (HSV) infection    OU   Hypertension    Hypertensive retinopathy    OU   Retinal detachment    OS   Sepsis due to Streptococcus, group B ([place]) 10/05/2018   Past Surgical History:  Past Surgical History:  Procedure Laterality Date   25 GAUGE PARS PLANA VITRECTOMY WITH 20 GAUGE MVR PORT FOR MACULAR HOLE Right 02/24/2019   Procedure: 25 GAUGE PARS PLANA VITRECTOMY WITH 20 GAUGE MVR PORT FOR MACULAR HOLE;  Surgeon: Bernarda Caffey, MD;  Location: [facility];  Service: Ophthalmology;  Laterality: Right;   APPLICATION OF WOUND VAC Left 08/27/2018   Procedure: APPLICATION OF WOUND VAC, left neck;  Surgeon: Gaye Pollack, MD;  Location: [facility];  Service: Thoracic;  Laterality: Left;   APPLICATION OF WOUND VAC Left 08/30/2018   Procedure: APPLICATION OF WOUND VAC;  Surgeon: Gaye Pollack, MD;  Location: [facility];  Service: Thoracic;  Laterality: Left;   AV FISTULA PLACEMENT Left 02/15/2020   Procedure: LEFT ARM ARTERIOVENOUS (AV) FISTULA CREATION;  Surgeon: Waynetta Sandy, MD;  Location: [facility];  Service: Vascular;  Laterality: Left;   CATARACT EXTRACTION Bilateral    CATARACT EXTRACTION W/ INTRAOCULAR LENS  IMPLANT, BILATERAL     COLONOSCOPY  06/24/2011   Procedure:  COLONOSCOPY;  Surgeon: [name], MD;  [facility];  Service: Endoscopy;  Laterality: N/A;  8:30 AM   EYE SURGERY     GAS/FLUID EXCHANGE Left 03/31/2019   Procedure: Gas/Fluid Exchange;  Surgeon: Bernarda Caffey, MD;  Location: [facility];  Service: Ophthalmology;  Laterality: Left;   INJECTION OF SILICONE OIL  9/37/1696   Procedure: Injection Of Silicone Oil;  Surgeon: Bernarda Caffey, MD;  Location: [facility];  Service: Ophthalmology;;   INSERTION OF DIALYSIS CATHETER Right 12/05/2019   Procedure: INSERTION OF DIALYSIS CATHETER RIGHT SUBCLAVIAN;  Surgeon: Virl Cagey, MD;  [facility];  Service: General;  Laterality: Right;   INSERTION OF DIALYSIS CATHETER Right 12/07/2019   Procedure: Minor Dialysis catheter in place- need to reposition/ adjust catheter to help with flows;  Surgeon: Virl Cagey, MD;  [facility]: [facility];  Service: General;  Laterality: Right;  procedure room case with 1% lidocaine, full sterile drape,  towels, full gown, large chloraprep, 4-0 Monocryl and dermabond    INSERTION OF DIALYSIS CATHETER Right 12/08/2019   Procedure: INSERTION OF DIALYSIS CATHETER EXCHANGE;  Surgeon: Virl Cagey, MD;  [facility];  Service: General;  Laterality: Right;   IR RADIOLOGIST EVAL & MGMT  02/07/2021   MEMBRANE PEEL Right 02/24/2019   Procedure: Antoine Primas;  Surgeon: Bernarda Caffey, MD;  Location: [facility];  Service: Ophthalmology;  Laterality: Right;   PARS PLANA VITRECTOMY Left 03/31/2019   Procedure: PARS PLANA VITRECTOMY WITH 25 GAUGE WITH MEMBRANE PEEL;  Surgeon: Bernarda Caffey, MD;  Location: [facility];  Service: Ophthalmology;  Laterality: Left;   PHOTOCOAGULATION WITH LASER Right 02/24/2019   Procedure: Photocoagulation With Laser;  Surgeon: Bernarda Caffey, MD;  Location: [facility];  Service: Ophthalmology;  Laterality: Right;   PHOTOCOAGULATION WITH LASER Left 03/31/2019   Procedure: Photocoagulation With Laser;  Surgeon: Bernarda Caffey, MD;  Location: [facility];  Service:  Ophthalmology;  Laterality: Left;   RETINAL DETACHMENT SURGERY Left 03/31/2019   TRD Repair - Dr. Bernarda Caffey   SILICON OIL REMOVAL Right 7/89/3810   Procedure: Silicon Oil Removal;  Surgeon: Bernarda Caffey, MD;  Location: [facility];  Service: Ophthalmology;  Laterality: Right;   STERNAL WOUND DEBRIDEMENT Left 08/27/2018   Procedure: Incision and DEBRIDEMENT Left Chest, Neck and Mediastinum;  Surgeon: Gaye Pollack, MD;  Location: [facility];  Service: Thoracic;  Laterality: Left;   STERNAL WOUND DEBRIDEMENT Left 08/30/2018   Procedure: WOUND VAC CHANGE, LEFT CHEST AND NECK, POSSIBLE DEBRIDEMENT;  Surgeon: Gaye Pollack, MD;  Location: [place] [facility];  Service: Thoracic;  Laterality: Left;   Social History:  reports that he has never smoked. He has never used smokeless tobacco. He reports that he does not drink alcohol and does not use drugs.  Family History:  Family History  Problem Relation Age of Onset   Hypertension Mother    Colon cancer Neg Hx      HOME MEDICATIONS: Allergies as of 03/01/2021       Reactions   Tramadol Nausea And Vomiting        Medication List        Accurate as of March 01, 2021  6:04 PM. If you have any questions, ask your nurse or doctor.          acetaminophen 500 MG tablet Commonly known as: TYLENOL Take 1,000 mg by mouth every 6 (six) hours as needed for moderate pain or headache.   amLODipine 2.5 MG tablet Commonly known as: NORVASC TAKE ONE TABLET BY MOUTH ONCE DAILY. What changed: Another medication with the same name was removed. Continue taking this medication, and follow the directions you see here. Changed by: Dorita Sciara, MD   atorvastatin 10 MG tablet Commonly known as: LIPITOR Take 1 tablet (10 mg total) by mouth daily.   brimonidine 0.2 % ophthalmic solution Commonly known as: ALPHAGAN Place 1 drop into both eyes daily.   calcium acetate 667 MG capsule Commonly known as: PHOSLO Take 1,334 mg by mouth 3 (three) times daily with  meals.   Calcium Acetate 668 (169 Ca) MG Tabs TAKE 2 TABLETS BY MOUTH THREE TIMES DAILY WITH MEALS   cinacalcet 30 MG tablet Commonly known as: SENSIPAR Take by mouth.   Darbepoetin Alfa 60 MCG/0.3ML Sosy injection Commonly known as: ARANESP Inject 0.3 mLs (60 mcg total) into the vein every Monday with hemodialysis.   Dexcom G6 Sensor Misc 1 Device by Does not apply route as directed.   Dexcom G6 Transmitter Misc 1 Device by Does not apply route as directed.   famotidine 40 MG tablet Commonly known as: PEPCID Take 0.5 tablets (20 mg total) by mouth at bedtime.   Insulin Pen Needle 32G X 4 MM Misc 1 Device by Does not apply route in the morning, at noon, in the evening, and at bedtime.   lidocaine-prilocaine cream Commonly known as: EMLA SMARTSIG:Sparingly Topical 3 Times a Week   metoprolol tartrate 25 MG tablet Commonly known as: LOPRESSOR Take 1 tablet (25 mg total) by mouth 2 (two) times daily.   Microlet Lancets Misc USE FOUR TIMES A DAY AS DIRECTED.   MIRCERA IJ Mircera   NovoLOG FlexPen 100 UNIT/ML FlexPen Generic drug: insulin aspart Inject 7 Units into the skin 3 (three) times daily with meals.   nystatin powder Commonly known as: MYCOSTATIN/NYSTOP Apply topically 2 (two) times daily. To groin and scrotum What changed:  how much to take when to take this reasons to take this   OneTouch Ultra test strip Generic drug: glucose blood 1 each by Other route in the morning, at noon, in the evening, and at bedtime. Use as instructed   pantoprazole 40 MG tablet Commonly known as: PROTONIX Take by mouth.   prednisoLONE acetate 1 % ophthalmic suspension Commonly known as: PRED FORTE Place 1 drop into both eyes 4 (four) times daily.   Prolensa 0.07 % Soln Generic drug: Bromfenac Sodium Place 1 drop into both eyes 4 (four) times daily.   [name] 100 UNIT/ML FlexTouch Pen Generic drug: insulin degludec Inject 22 Units into the skin at  bedtime.   Velphoro 500 MG chewable tablet Generic drug: sucroferric oxyhydroxide Chew 500 mg by mouth 3 (three) times daily with meals.         OBJECTIVE:  Vital Signs: BP 128/80   Pulse 88   Ht 5\' 11"  (1.803 m)   Wt 192 lb (87.1 kg)   SpO2 99%   BMI 26.78 kg/m   Wt Readings from Last 3 Encounters:  03/01/21 192 lb (87.1 kg)  10/22/20 186 lb 6.4 oz (84.6 kg)  10/10/20 180 lb 4 oz (81.8 kg)     Exam: General: Pt appears well and is in NAD  Lungs: Clear with good BS bilat with no rales, rhonchi, or wheezes  Heart: RRR with normal S1 and S2 and no gallops; no murmurs; no rub  Abdomen: Normoactive bowel sounds, soft, nontender, without masses or organomegaly palpable  Extremities: No pretibial edema.   Neuro: MS is good with appropriate affect, pt is alert and Ox3    DM foot exam:   07/11/2020  The skin of the feet is without sores or ulcerations, but pt with callous formation at the medial aspect of the right great toe  The pedal pulses are 2+ on right and 2+ on left. The sensation is decreased to a screening 5.07, 10 gram monofilament bilaterally        DATA REVIEWED:  Lab Results  Component Value Date   HGBA1C 8.5 (A) 10/10/2020   HGBA1C 11.0 (A) 07/11/2020   HGBA1C 8.8 (H) 11/30/2019   Lab Results  Component Value Date   MICROALBUR 3,744.2 (H) 11/30/2019   LDLCALC 110 (H) 10/22/2020   CREATININE 6.90 (H) 02/15/2020   Lab Results  Component Value Date   MICRALBCREAT 4,825 (H) 11/30/2019     Lab Results  Component Value Date   CHOL 186 10/22/2020   HDL 57 10/22/2020   LDLCALC 110 (H) 10/22/2020   TRIG 103 10/22/2020   CHOLHDL 3.3 10/22/2020       A1c 02/13/2021 A1c 7.9%  ASSESSMENT / PLAN / RECOMMENDATIONS:   1) Type 2 Diabetes Mellitus, With improving Glycemic control, With Retinopathic, neuropathic complications and ESRD  - Most recent A1c of 7.9 %. Goal A1c < 7.0 %.     - I have praised him on the improved glycemic control  -I am  going to adjust his prandial dose with supper -I am also going to provide him with correction scale -Per patient he has been accepted to transplant list for pancreas and kidney    MEDICATIONS: -Continue Tresiba 22  units at Bedtime -Change NovoLog to 7 units with breakfast and lunch, and 8 units with supper -CF: NovoLog(BG -130/40)    EDUCATION / INSTRUCTIONS: BG monitoring instructions: Patient is instructed to check his blood sugars 3 times a day, before meals. Call [place] [facility] if: BG persistently < 70 I reviewed the Rule of 15 for the treatment of hypoglycemia in detail with the patient. Literature supplied.    2) Diabetic complications:  Eye: Does  have known diabetic retinopathy.  Neuro/ Feet: Does  have known diabetic peripheral neuropathy .  Renal: Patient does ave known baseline CKD. He   is not on an ACEI/ARB at present.     F/U in 6 months   Signed electronically by: Mack Guise, MD  [facility] End[facility]  [facility] [address] [place],  [address] Phone: [phone] FAX: [phone]   CC: [name], [place] [address] Phone: [phone]  Fax: [phone]  Return to Endocrinology clinic as below: Future Appointments  Date Time Provider [place]  03/08/2021  2:15 PM Bernarda Caffey, MD [facility] None  03/20/2021  4:15 PM Felipa Furnace, DPM [facility]  09/02/2021  3:00 PM Claude Waldman, Melanie Crazier, MD [facility] None

## 2021-03-05 ENCOUNTER — Other Ambulatory Visit (HOSPITAL_COMMUNITY): Payer: Self-pay | Admitting: Interventional Radiology

## 2021-03-05 ENCOUNTER — Telehealth (HOSPITAL_COMMUNITY): Payer: Self-pay | Admitting: Radiology

## 2021-03-05 DIAGNOSIS — S91101A Unspecified open wound of right great toe without damage to nail, initial encounter: Secondary | ICD-10-CM

## 2021-03-05 NOTE — Telephone Encounter (Signed)
Called pt, left VM for him to call me to schedule his RLE angio and possible intervention with Dr. Earleen Newport. JM

## 2021-03-05 NOTE — Progress Notes (Addendum)
[facility] Note  03/08/2021     CHIEF COMPLAINT Patient presents for Retina Follow Up   HISTORY OF PRESENT ILLNESS: Randy Oneal is a 55 y.o. male who presents to the clinic today for:  HPI     Retina Follow Up   Patient presents with  Diabetic Retinopathy.  In both eyes.  Duration of 4 weeks.  Since onset it is stable.  I, the attending physician,  performed the HPI with the patient and updated documentation appropriately.        Comments   Pt here for 6 wk ret f/u PDR OU. Pt states no vision changes. Blood sugar was 155 this am and the most recent A1C level was 7.6. Pt does not report any ocular pain or discomfort.       Last edited by Bernarda Caffey, MD on 03/08/2021 10:56 PM.      Referring physician: Rutherford Guys, [place] [address],  [place] [address]  HISTORICAL INFORMATION:   Selected notes from the MEDICAL RECORD NUMBER Referred by Dr.Mark Gershon Crane for concern of decreased vision post cataract sx   CURRENT MEDICATIONS: Current Outpatient Medications (Ophthalmic Drugs)  Medication Sig   brimonidine (ALPHAGAN) 0.2 % ophthalmic solution Place 1 drop into both eyes daily.   Bromfenac Sodium (PROLENSA) 0.07 % SOLN Place 1 drop into both eyes 4 (four) times daily.   prednisoLONE acetate (PRED FORTE) 1 % ophthalmic suspension Place 1 drop into both eyes 4 (four) times daily.   No current facility-administered medications for this visit. (Ophthalmic Drugs)   Current Outpatient Medications (Other)  Medication Sig   acetaminophen (TYLENOL) 500 MG tablet Take 1,000 mg by mouth every 6 (six) hours as needed for moderate pain or headache.   amLODipine (NORVASC) 2.5 MG tablet TAKE ONE TABLET BY MOUTH ONCE DAILY.   atorvastatin (LIPITOR) 10 MG tablet Take 1 tablet (10 mg total) by mouth daily.   calcium acetate (PHOSLO) 667 MG capsule Take 1,334 mg by mouth 3 (three) times daily with meals.   Calcium Acetate 668 (169 Ca) MG TABS  TAKE 2 TABLETS BY MOUTH THREE TIMES DAILY WITH MEALS   cinacalcet (SENSIPAR) 30 MG tablet Take by mouth.   Continuous Blood Gluc Sensor (DEXCOM G6 SENSOR) MISC 1 Device by Does not apply route as directed.   Continuous Blood Gluc Transmit (DEXCOM G6 TRANSMITTER) MISC 1 Device by Does not apply route as directed.   Darbepoetin Alfa (ARANESP) 60 MCG/0.3ML SOSY injection Inject 0.3 mLs (60 mcg total) into the vein every Monday with hemodialysis.   famotidine (PEPCID) 40 MG tablet Take 0.5 tablets (20 mg total) by mouth at bedtime.   glucose blood (ONETOUCH ULTRA) test strip 1 each by Other route in the morning, at noon, in the evening, and at bedtime. Use as instructed   insulin aspart (NOVOLOG FLEXPEN) 100 UNIT/ML FlexPen Inject 7 Units into the skin 3 (three) times daily with meals.   Insulin Pen Needle 32G X 4 MM MISC 1 Device by Does not apply route in the morning, at noon, in the evening, and at bedtime.   lidocaine-prilocaine (EMLA) cream SMARTSIG:Sparingly Topical 3 Times a Week   Methoxy PEG-Epoetin Beta (MIRCERA IJ) Mircera   metoprolol tartrate (LOPRESSOR) 25 MG tablet Take 1 tablet (25 mg total) by mouth 2 (two) times daily.   MICROLET LANCETS MISC USE FOUR TIMES A DAY AS DIRECTED.   nystatin (MYCOSTATIN/NYSTOP) powder Apply topically 2 (two) times daily. To  groin and scrotum (Patient taking differently: Apply 1 g topically 2 (two) times daily as needed (rash). To groin and scrotum)   pantoprazole (PROTONIX) 40 MG tablet Take by mouth.   sucroferric oxyhydroxide (VELPHORO) 500 MG chewable tablet Chew 500 mg by mouth 3 (three) times daily with meals.   TRESIBA FLEXTOUCH 100 UNIT/ML FlexTouch Pen Inject 22 Units into the skin at bedtime.   No current facility-administered medications for this visit. (Other)      REVIEW OF SYSTEMS: ROS   Positive for: Genitourinary, Endocrine, Eyes Negative for: Constitutional, Gastrointestinal, Neurological, Skin, Musculoskeletal, HENT,  Cardiovascular, Respiratory, Psychiatric, Allergic/Imm, Heme/Lymph Last edited by Kingsley Spittle, COT on 03/08/2021  2:29 PM.         ALLERGIES Allergies  Allergen Reactions   Tramadol Nausea And Vomiting    PAST MEDICAL HISTORY Past Medical History:  Diagnosis Date   Anemia    Cataract 07/02/2020   Diabetes mellitus    Type 2    Diabetic retinopathy of both eyes ([place]) 01/31/2019   Dr Coralyn Pear 01/2019   ESRD (end stage renal disease) ([place])    [place] Frenisus   Herpes simplex virus (HSV) infection    OU   Hypertension    Hypertensive retinopathy    OU   Retinal detachment    OS   Sepsis due to Streptococcus, group B ([place]) 10/05/2018   Past Surgical History:  Procedure Laterality Date   25 GAUGE PARS PLANA VITRECTOMY WITH 20 GAUGE MVR PORT FOR MACULAR HOLE Right 02/24/2019   Procedure: 25 GAUGE PARS PLANA VITRECTOMY WITH 20 GAUGE MVR PORT FOR MACULAR HOLE;  Surgeon: Bernarda Caffey, MD;  Location: [facility];  Service: Ophthalmology;  Laterality: Right;   APPLICATION OF WOUND VAC Left 08/27/2018   Procedure: APPLICATION OF WOUND VAC, left neck;  Surgeon: Gaye Pollack, MD;  Location: [facility];  Service: Thoracic;  Laterality: Left;   APPLICATION OF WOUND VAC Left 08/30/2018   Procedure: APPLICATION OF WOUND VAC;  Surgeon: Gaye Pollack, MD;  Location: [facility];  Service: Thoracic;  Laterality: Left;   AV FISTULA PLACEMENT Left 02/15/2020   Procedure: LEFT ARM ARTERIOVENOUS (AV) FISTULA CREATION;  Surgeon: Waynetta Sandy, MD;  Location: [facility];  Service: Vascular;  Laterality: Left;   CATARACT EXTRACTION Bilateral    CATARACT EXTRACTION W/ INTRAOCULAR LENS  IMPLANT, BILATERAL     COLONOSCOPY  06/24/2011   Procedure: COLONOSCOPY;  Surgeon: [name], MD;  [facility];  Service: Endoscopy;  Laterality: N/A;  8:30 AM   EYE SURGERY     GAS/FLUID EXCHANGE Left 03/31/2019   Procedure: Gas/Fluid Exchange;  Surgeon: Bernarda Caffey, MD;  Location: [facility];  Service:  Ophthalmology;  Laterality: Left;   INJECTION OF SILICONE OIL  9/70/2637   Procedure: Injection Of Silicone Oil;  Surgeon: Bernarda Caffey, MD;  Location: [facility];  Service: Ophthalmology;;   INSERTION OF DIALYSIS CATHETER Right 12/05/2019   Procedure: INSERTION OF DIALYSIS CATHETER RIGHT SUBCLAVIAN;  Surgeon: Virl Cagey, MD;  [facility];  Service: General;  Laterality: Right;   INSERTION OF DIALYSIS CATHETER Right 12/07/2019   Procedure: Minor Dialysis catheter in place- need to reposition/ adjust catheter to help with flows;  Surgeon: Virl Cagey, MD;  [facility]: [facility];  Service: General;  Laterality: Right;  procedure room case with 1% lidocaine, full sterile drape,  towels, full gown, large chloraprep, 4-0 Monocryl and dermabond    INSERTION OF DIALYSIS CATHETER Right 12/08/2019  Procedure: INSERTION OF DIALYSIS CATHETER EXCHANGE;  Surgeon: Virl Cagey, MD;  [facility];  Service: General;  Laterality: Right;   IR RADIOLOGIST EVAL & MGMT  02/07/2021   MEMBRANE PEEL Right 02/24/2019   Procedure: Antoine Primas;  Surgeon: Bernarda Caffey, MD;  Location: [facility];  Service: Ophthalmology;  Laterality: Right;   PARS PLANA VITRECTOMY Left 03/31/2019   Procedure: PARS PLANA VITRECTOMY WITH 25 GAUGE WITH MEMBRANE PEEL;  Surgeon: Bernarda Caffey, MD;  Location: [facility];  Service: Ophthalmology;  Laterality: Left;   PHOTOCOAGULATION WITH LASER Right 02/24/2019   Procedure: Photocoagulation With Laser;  Surgeon: Bernarda Caffey, MD;  Location: [facility];  Service: Ophthalmology;  Laterality: Right;   PHOTOCOAGULATION WITH LASER Left 03/31/2019   Procedure: Photocoagulation With Laser;  Surgeon: Bernarda Caffey, MD;  Location: [facility];  Service: Ophthalmology;  Laterality: Left;   RETINAL DETACHMENT SURGERY Left 03/31/2019   TRD Repair - Dr. Bernarda Caffey   SILICON OIL REMOVAL Right 2/90/2111   Procedure: Silicon Oil Removal;  Surgeon: Bernarda Caffey, MD;  Location: [facility];  Service: Ophthalmology;   Laterality: Right;   STERNAL WOUND DEBRIDEMENT Left 08/27/2018   Procedure: Incision and DEBRIDEMENT Left Chest, Neck and Mediastinum;  Surgeon: Gaye Pollack, MD;  [facility];  Service: Thoracic;  Laterality: Left;   STERNAL WOUND DEBRIDEMENT Left 08/30/2018   Procedure: WOUND VAC CHANGE, LEFT CHEST AND NECK, POSSIBLE DEBRIDEMENT;  Surgeon: Gaye Pollack, MD;  [facility];  Service: Thoracic;  Laterality: Left;    FAMILY HISTORY Family History  Problem Relation Age of Onset   Hypertension Mother    Colon cancer Neg Hx     SOCIAL HISTORY Social History   Tobacco Use   Smoking status: Never   Smokeless tobacco: Never  Vaping Use   Vaping Use: Never used  Substance Use Topics   Alcohol use: No   Drug use: No         OPHTHALMIC EXAM:  Base Eye Exam     Visual Acuity (Snellen - Linear)       Right Left   Dist [place] 20/400 20/400   Dist ph [place] 20/200 -1 20/200 -2         Tonometry (Tonopen, 2:41 PM)       Right Left   Pressure 15 15         Pupils       Dark Light Shape React APD   Right 4 3 Irregular Minimal None   Left 3 2 Irregular Minimal None         Visual Fields (Counting fingers)       Left Right     Full   Restrictions Partial outer superior temporal, inferior temporal, inferior nasal deficiencies          Extraocular Movement       Right Left    Full, Ortho Full, Ortho         Neuro/Psych     Oriented x3: Yes   Mood/Affect: Normal         Dilation     Both eyes: 1.0% Mydriacyl, 2.5% Phenylephrine @ 2:42 PM           Slit Lamp and Fundus Exam     Slit Lamp Exam       Right Left   Lids/Lashes mild Meibomian gland dysfunction mild Meibomian gland dysfunction   Conjunctiva/Sclera subconj silicon oil bubbles, Chemosis, conj Cyst White and quiet   Cornea Trace PEE,  Well healed cataract wound, arcus, Debris in tear film Trace PEE, Well healed cataract wound, arcus, Debris in tear film   Anterior Chamber Deep  and quiet, no cell or flare Deep and quiet, no cell or flare   Iris slightly irregular dilation, posterior synchiae from 3:00-1200 round, focal Posterior synechiae at 1030   Lens PC IOL in good position, pigment deposition, 1+ PCO, mild pigment deposition on optic PC IOL in good position, mild pigment deposition   Vitreous Post vit, silicon oil bubble ~10% post vitrectomy, good silicone oil fill         Fundus Exam       Right Left   Disc 2+pallor, sharp rim, mild fibrosis along ST rim +pallor, sharp rim, fine NVD--regressing, mild fibrosis   C/D Ratio 0.2 0.2   Macula attached under oil, scattered MA/IRH, nasal and central thickening / edema  attached under oil, +fibrosis/edema -- peristent, focal bands of PRF with traction emanating from ST macula -- stable, cluster of DBH inferior mac -- improved   Vessels attenuated, Tortuous Attenuated, dilated venules, Tortuous, early NVE IT arcades   Periphery Attached, dense 360 PRP laser, mild scattered fibrosis - persistent, Focal ret hole along distal IT arcades--good laser surrounding Attached, good 360 PRP laser changes, pre-retinal fibrosis extending to posterior PRP border superior and inferiorly            IMAGING AND PROCEDURES  Imaging and Procedures for _0 @  OCT, Retina - OU - Both Eyes       Right Eye Quality was good. Central Foveal Thickness: 757. Progression has been stable. Findings include preretinal fibrosis, abnormal foveal contour, outer retinal atrophy, epiretinal membrane, intraretinal fluid, no SRF (Persistent IRF -- ?slight improvement).   Left Eye Quality was good. Central Foveal Thickness: 706. Progression has been stable. Findings include preretinal fibrosis, intraretinal fluid, abnormal foveal contour, intraretinal hyper-reflective material, epiretinal membrane, no SRF (Persistent IRF and diffuse edema -- slightly improved).   Notes *Images captured and stored on drive  Diagnosis / Impression:  +DME  OU OD: Persistent IRF -- ?slight improvement OS: Persistent IRF and diffuse edema -- slightly improved  Clinical management:  See below  Abbreviations: NFP - Normal foveal profile. CME - cystoid macular edema. PED - pigment epithelial detachment. IRF - intraretinal fluid. SRF - subretinal fluid. EZ - ellipsoid zone. ERM - epiretinal membrane. ORA - outer retinal atrophy. ORT - outer retinal tubulation. SRHM - subretinal hyper-reflective material      Intravitreal Injection, Pharmacologic Agent - OD - Right Eye       Time Out 03/08/2021. 3:11 PM. Confirmed correct patient, procedure, site, and patient consented.   Anesthesia Topical anesthesia was used. Anesthetic medications included Lidocaine 2%, Proparacaine 0.5%.   Procedure Preparation included eyelid speculum, 5% betadine to ocular surface. A (32g) needle was used.   Injection: 2 mg aflibercept 2 MG/0.05ML   Route: Intravitreal, Site: Right Eye   NDC: A3590391, Lot: [id number], Expiration date: 12/08/2021, Waste: 0.05 mL   Post-op Post injection exam found visual acuity of at least counting fingers. The patient tolerated the procedure well. There were no complications. The patient received written and verbal post procedure care education. Post injection medications were not given.      Intravitreal Injection, Pharmacologic Agent - OS - Left Eye       Time Out 03/08/2021. 3:12 PM. Confirmed correct patient, procedure, site, and patient consented.   Anesthesia Topical anesthesia was used. Anesthetic medications included Lidocaine 2%, Proparacaine 0.5%.  Procedure Preparation included 5% betadine to ocular surface, eyelid speculum. A (32g) needle was used.   Injection: 2 mg aflibercept 2 MG/0.05ML   Route: Intravitreal, Site: Left Eye   NDC: A3590391, Lot: [id number], Expiration date: 12/08/2021, Waste: 0.05 mL   Post-op Post injection exam found visual acuity of at least counting fingers. The patient  tolerated the procedure well. There were no complications. The patient received written and verbal post procedure care education. Post injection medications were not given.            ASSESSMENT/PLAN:    ICD-10-CM   1. Proliferative diabetic retinopathy of both eyes with macular edema associated with type 2 diabetes mellitus (HCC)  R15.4008 Intravitreal Injection, Pharmacologic Agent - OD - Right Eye    Intravitreal Injection, Pharmacologic Agent - OS - Left Eye    aflibercept (EYLEA) SOLN 2 mg    aflibercept (EYLEA) SOLN 2 mg    2. Retinal detachment, tractional, bilateral  H33.43     3. Vitreous hemorrhage of both eyes ([place])  H43.13     4. Retinal edema  H35.81 OCT, Retina - OU - Both Eyes    5. Essential hypertension  I10     6. Hypertensive retinopathy of both eyes  H35.033     7. Pseudophakia of both eyes  Z96.1      1-4. Proliferative diabetic retinopathy with DME, TRD, and vitreous hemorrhage OU (OD > OS)  - last A1c: 7.9 on 7.6.22  - lost to f/u from Jan 2021 to Oct 2021 due to loss of insurance coverage  - pt with complex medical history with hospitalization in Jan-Feb 2020 for bacteremia and abscess  - history of poor glycemic control for years  - s/p IVA OD #1 (06.20.20), #2 (07.01.20), #3 (09.11.20), #4 (10.09.20), #5 (11.06.20), #6 (11.24.21), #7 (12.22.21), #8 (01.19.22)  - s/p IVA OS #1 (06.20.20), #2 (07.01.20), #3 (08.13.20), #4 (09.11.20)  #5 (11.06.20), #6 (11.24.21), #7 (12.22.21), #8 (01.19.22)  - s/p IVE OU #1 (12.04.20) - sample; #2 (01.06.21), #3 (02.18.22), #4 (03.18.22), #5 (04.18.22), #6 (05.25.22), #7 (07.01.22)  - s/p STK OS #1 (10.09.20)  - s/p PRP OS (06.10.20)  - s/p PPV/PFC/EL/FAX/silicone oil OD, 67.61.95  - s/p subconj silicon oil removal OD 8.20.20  - s/p PPV/EL/FAX/silicone oil OS, 09.32.67  - BCVA 20/200 (worse OD) , 20/200 (improved OS)             - OCT: OD: Persistent IRF -- slight improvement; OS: Persistent IRF and diffuse edema  -  recommend IVE OU #8 for DME today, 07.29.22  - RBA of procedure discussed, questions answered  - informed consent obtained and signed  - see procedure note             - Eylea benefits investigation begun 01.19.22 -- approved  - discussed possible need for PPV w/ membrane peel and silicon oil exchange OS due to worsening fibrosis  - cont topical PF and Prolensa QID OU for possible CME component -- pt ran out on Wed, 06.29.22, pt to re-start - given sample of Prolensa in office today  - f/u 4 weeks -- DFE/OCT, possible injection  5,6. Hypertensive retinopathy OU  - discussed importance of tight BP control  - monitor  7. Pseudophakia OU  - s/p CE with IOL Gershon Crane)  Ophthalmic Meds Ordered this visit:  Meds ordered this encounter  Medications   aflibercept (EYLEA) SOLN 2 mg   aflibercept (EYLEA) SOLN 2 mg  Return in about 4 weeks (around 04/05/2021) for 4 wk f/u for PDR w/DME OU w/DFE/OCT/likely injs..  There are no Patient Instructions on file for this visit.  This document serves as a record of services personally performed by Gardiner Sleeper, MD, PhD. It was created on their behalf by Estill Bakes, COT an ophthalmic technician. The creation of this record is the provider's dictation and/or activities during the visit.    Electronically signed by: Estill Bakes, COT 7.29.22 @ 11:01 PM   Gardiner Sleeper, M.D., Ph.D. Diseases & Surgery of the Retina and [place] 03/08/2021   I have reviewed the above documentation for accuracy and completeness, and I agree with the above. Gardiner Sleeper, M.D., Ph.D. 03/08/21 11:01 PM  Abbreviations: M myopia (nearsighted); A astigmatism; H hyperopia (farsighted); P presbyopia; Mrx spectacle prescription;  CTL contact lenses; OD right eye; OS left eye; OU both eyes  XT exotropia; ET esotropia; PEK punctate epithelial keratitis; PEE punctate epithelial erosions; DES dry eye syndrome; MGD meibomian gland  dysfunction; ATs artificial tears; PFAT's preservative free artificial tears; [place] nuclear sclerotic cataract; PSC posterior subcapsular cataract; ERM epi-retinal membrane; PVD posterior vitreous detachment; RD retinal detachment; DM diabetes mellitus; DR diabetic retinopathy; NPDR non-proliferative diabetic retinopathy; PDR proliferative diabetic retinopathy; CSME clinically significant macular edema; DME diabetic macular edema; dbh dot blot hemorrhages; CWS cotton wool spot; POAG primary open angle glaucoma; C/D cup-to-disc ratio; HVF humphrey visual field; GVF goldmann visual field; OCT optical coherence tomography; IOP intraocular pressure; BRVO Branch retinal vein occlusion; CRVO central retinal vein occlusion; CRAO central retinal artery occlusion; BRAO branch retinal artery occlusion; RT retinal tear; SB scleral buckle; PPV pars plana vitrectomy; VH Vitreous hemorrhage; PRP panretinal laser photocoagulation; IVK intravitreal kenalog; VMT vitreomacular traction; MH Macular hole;  NVD neovascularization of the disc; NVE neovascularization elsewhere; AREDS age related eye disease study; ARMD age related macular degeneration; POAG primary open angle glaucoma; EBMD epithelial/anterior basement membrane dystrophy; ACIOL anterior chamber intraocular lens; IOL intraocular lens; PCIOL posterior chamber intraocular lens; Phaco/IOL phacoemulsification with intraocular lens placement; [place] photorefractive keratectomy; LASIK laser assisted in situ keratomileusis; HTN hypertension; DM diabetes mellitus; COPD chronic obstructive pulmonary disease

## 2021-03-08 ENCOUNTER — Ambulatory Visit (INDEPENDENT_AMBULATORY_CARE_PROVIDER_SITE_OTHER): Payer: Medicare Other | Admitting: Ophthalmology

## 2021-03-08 ENCOUNTER — Encounter (INDEPENDENT_AMBULATORY_CARE_PROVIDER_SITE_OTHER): Payer: Self-pay | Admitting: Ophthalmology

## 2021-03-08 ENCOUNTER — Other Ambulatory Visit: Payer: Self-pay

## 2021-03-08 DIAGNOSIS — Z961 Presence of intraocular lens: Secondary | ICD-10-CM

## 2021-03-08 DIAGNOSIS — H3343 Traction detachment of retina, bilateral: Secondary | ICD-10-CM | POA: Diagnosis not present

## 2021-03-08 DIAGNOSIS — E113513 Type 2 diabetes mellitus with proliferative diabetic retinopathy with macular edema, bilateral: Secondary | ICD-10-CM | POA: Diagnosis not present

## 2021-03-08 DIAGNOSIS — H3581 Retinal edema: Secondary | ICD-10-CM

## 2021-03-08 DIAGNOSIS — I10 Essential (primary) hypertension: Secondary | ICD-10-CM

## 2021-03-08 DIAGNOSIS — H4921 Sixth [abducent] nerve palsy, right eye: Secondary | ICD-10-CM

## 2021-03-08 DIAGNOSIS — H4313 Vitreous hemorrhage, bilateral: Secondary | ICD-10-CM

## 2021-03-08 DIAGNOSIS — H35353 Cystoid macular degeneration, bilateral: Secondary | ICD-10-CM

## 2021-03-08 DIAGNOSIS — H35352 Cystoid macular degeneration, left eye: Secondary | ICD-10-CM

## 2021-03-08 DIAGNOSIS — H35033 Hypertensive retinopathy, bilateral: Secondary | ICD-10-CM

## 2021-03-08 MED ORDER — AFLIBERCEPT 2MG/0.05ML IZ SOLN FOR KALEIDOSCOPE
2.0000 mg | INTRAVITREAL | Status: AC | PRN
  Administered 2021-03-08: 2 mg via INTRAVITREAL

## 2021-03-14 ENCOUNTER — Other Ambulatory Visit: Payer: Self-pay | Admitting: Physician Assistant

## 2021-03-15 ENCOUNTER — Ambulatory Visit (HOSPITAL_COMMUNITY)
Admission: RE | Admit: 2021-03-15 | Discharge: 2021-03-15 | Disposition: A | Discharge status: Home / Self Care | Payer: Medicare Other | Source: Ambulatory Visit | Attending: Interventional Radiology | Admitting: Interventional Radiology

## 2021-03-15 ENCOUNTER — Encounter (HOSPITAL_COMMUNITY): Payer: Self-pay

## 2021-03-15 ENCOUNTER — Other Ambulatory Visit (HOSPITAL_COMMUNITY): Payer: Self-pay | Admitting: Interventional Radiology

## 2021-03-15 DIAGNOSIS — Z992 Dependence on renal dialysis: Secondary | ICD-10-CM | POA: Diagnosis not present

## 2021-03-15 DIAGNOSIS — N186 End stage renal disease: Secondary | ICD-10-CM | POA: Diagnosis not present

## 2021-03-15 DIAGNOSIS — Z794 Long term (current) use of insulin: Secondary | ICD-10-CM | POA: Insufficient documentation

## 2021-03-15 DIAGNOSIS — I120 Hypertensive chronic kidney disease with stage 5 chronic kidney disease or end stage renal disease: Secondary | ICD-10-CM | POA: Insufficient documentation

## 2021-03-15 DIAGNOSIS — Z885 Allergy status to narcotic agent status: Secondary | ICD-10-CM | POA: Diagnosis not present

## 2021-03-15 DIAGNOSIS — S91101A Unspecified open wound of right great toe without damage to nail, initial encounter: Secondary | ICD-10-CM

## 2021-03-15 DIAGNOSIS — L97519 Non-pressure chronic ulcer of other part of right foot with unspecified severity: Secondary | ICD-10-CM | POA: Insufficient documentation

## 2021-03-15 DIAGNOSIS — E11621 Type 2 diabetes mellitus with foot ulcer: Secondary | ICD-10-CM | POA: Insufficient documentation

## 2021-03-15 DIAGNOSIS — E1122 Type 2 diabetes mellitus with diabetic chronic kidney disease: Secondary | ICD-10-CM | POA: Insufficient documentation

## 2021-03-15 DIAGNOSIS — I70235 Atherosclerosis of native arteries of right leg with ulceration of other part of foot: Secondary | ICD-10-CM | POA: Insufficient documentation

## 2021-03-15 DIAGNOSIS — Z79899 Other long term (current) drug therapy: Secondary | ICD-10-CM | POA: Insufficient documentation

## 2021-03-15 HISTORY — PX: IR ANGIOGRAM EXTREMITY RIGHT: IMG652

## 2021-03-15 HISTORY — PX: IR US GUIDE VASC ACCESS RIGHT: IMG2390

## 2021-03-15 LAB — CBC
HCT: 41.2 % (ref 39.0–52.0)
Hemoglobin: 13.1 g/dL (ref 13.0–17.0)
MCH: 28.7 pg (ref 26.0–34.0)
MCHC: 31.8 g/dL (ref 30.0–36.0)
MCV: 90.4 fL (ref 80.0–100.0)
Platelets: 196 10*3/uL (ref 150–400)
RBC: 4.56 MIL/uL (ref 4.22–5.81)
RDW: 16.8 % — ABNORMAL HIGH (ref 11.5–15.5)
WBC: 5.9 10*3/uL (ref 4.0–10.5)
nRBC: 0.3 % — ABNORMAL HIGH (ref 0.0–0.2)

## 2021-03-15 LAB — BASIC METABOLIC PANEL
Anion gap: 10 (ref 5–15)
BUN: 51 mg/dL — ABNORMAL HIGH (ref 6–20)
CO2: 33 mmol/L — ABNORMAL HIGH (ref 22–32)
Calcium: 9.7 mg/dL (ref 8.9–10.3)
Chloride: 93 mmol/L — ABNORMAL LOW (ref 98–111)
Creatinine, Ser: 9.27 mg/dL — ABNORMAL HIGH (ref 0.61–1.24)
GFR, Estimated: 6 mL/min — ABNORMAL LOW (ref >60)
Glucose, Bld: 86 mg/dL (ref 70–99)
Potassium: 4.9 mmol/L (ref 3.5–5.1)
Sodium: 136 mmol/L (ref 135–145)

## 2021-03-15 LAB — PROTIME-INR
INR: 1 (ref 0.8–1.2)
Prothrombin Time: 12.8 seconds (ref 11.4–15.2)

## 2021-03-15 MED ORDER — FENTANYL CITRATE (PF) 100 MCG/2ML IJ SOLN
INTRAMUSCULAR | Status: AC
  Filled 2021-03-15: qty 4

## 2021-03-15 MED ORDER — LIDOCAINE HCL 1 % IJ SOLN
INTRAMUSCULAR | Status: AC
  Filled 2021-03-15: qty 20

## 2021-03-15 MED ORDER — CLOPIDOGREL BISULFATE 75 MG PO TABS
ORAL_TABLET | ORAL | Status: AC | PRN
  Administered 2021-03-15: 300 mg via ORAL

## 2021-03-15 MED ORDER — IOHEXOL 300 MG/ML  SOLN
100.0000 mL | Freq: Once | INTRAMUSCULAR | Status: AC | PRN
  Administered 2021-03-15: 40 mL via INTRA_ARTERIAL

## 2021-03-15 MED ORDER — HEPARIN SODIUM (PORCINE) 1000 UNIT/ML IJ SOLN
INTRAMUSCULAR | Status: AC | PRN
  Administered 2021-03-15: 8000 [IU] via INTRAVENOUS

## 2021-03-15 MED ORDER — CLOPIDOGREL BISULFATE 300 MG PO TABS
ORAL_TABLET | ORAL | Status: AC
  Filled 2021-03-15: qty 1

## 2021-03-15 MED ORDER — ASPIRIN 325 MG PO TABS
ORAL_TABLET | ORAL | Status: AC
  Filled 2021-03-15: qty 2

## 2021-03-15 MED ORDER — ASPIRIN 325 MG PO TABS
ORAL_TABLET | ORAL | Status: AC | PRN
  Administered 2021-03-15: 650 mg via ORAL

## 2021-03-15 MED ORDER — MIDAZOLAM HCL 2 MG/2ML IJ SOLN
INTRAMUSCULAR | Status: AC
  Filled 2021-03-15: qty 4

## 2021-03-15 MED ORDER — FENTANYL CITRATE (PF) 100 MCG/2ML IJ SOLN
INTRAMUSCULAR | Status: AC | PRN
  Administered 2021-03-15: 50 ug via INTRAVENOUS

## 2021-03-15 MED ORDER — MIDAZOLAM HCL 2 MG/2ML IJ SOLN
INTRAMUSCULAR | Status: AC | PRN
  Administered 2021-03-15: 1 mg via INTRAVENOUS

## 2021-03-15 MED ORDER — LIDOCAINE HCL (PF) 1 % IJ SOLN
INTRAMUSCULAR | Status: AC | PRN
Start: 2021-03-15 — End: 2021-03-15
  Administered 2021-03-15: 10 mL

## 2021-03-15 MED ORDER — IODIXANOL 320 MG/ML IV SOLN
100.0000 mL | Freq: Once | INTRAVENOUS | Status: AC | PRN
  Administered 2021-03-15: 46 mL via INTRAVENOUS

## 2021-03-15 MED ORDER — SODIUM CHLORIDE 0.9 % IV SOLN: INTRAVENOUS | Status: DC

## 2021-03-15 MED ORDER — HEPARIN SODIUM (PORCINE) 1000 UNIT/ML IJ SOLN
INTRAMUSCULAR | Status: AC
  Filled 2021-03-15: qty 1

## 2021-03-15 NOTE — Sedation Documentation (Signed)
Vital signs stable. 

## 2021-03-15 NOTE — Sedation Documentation (Signed)
Patient is resting comfortably. Procedure continues 

## 2021-03-15 NOTE — Sedation Documentation (Signed)
Pt arrived to IR suite. Consent signed, pt is NPO. Alert and oriented x4. Pt assisted self to procedure table independently and without difficulty. Pt denies pain and discomfort at this time. Vitals stable.

## 2021-03-15 NOTE — Sedation Documentation (Signed)
Patient is resting comfortably. No  complaints at this time, procedure continues ?

## 2021-03-15 NOTE — Sedation Documentation (Signed)
Vital signs stable. Pt is sleeping

## 2021-03-15 NOTE — H&P (Signed)
Chief Complaint: Non-healing right great toe wound  Referring Physician(s): Randy Oneal  Supervising Physician: Randy Oneal  Patient Status: Randy Oneal - Out-pt  History of Present Illness: Randy Oneal is a 55 y.o. male who was evaluated by Randy Oneal on 02/07/2021 for a non-healing right great toe wound to discuss revascularization.    He has a recurring wound/callous at the right great toe on and off for several months now.    He routinely gets his foot care with Randy Oneal office, and after the last time he had some debridement on the toe there is some concern for healing.     ABIs 01/30/21 Right ABI: Non-compressible Left ABI: non-compressible.     He has ESRD and is on hemodialysis.   He had MRI of the right foot completed 02/01/21 with results that are concerning for early osteomyelitis of the right hallux.   He is here today for angiography and possible revascularization.  He is NPO.       Past Medical History:  Diagnosis Date   Anemia    Cataract 07/02/2020   Diabetes mellitus    Type 2    Diabetic retinopathy of both eyes (Randy Oneal) 01/31/2019   Dr Randy Oneal 01/2019   ESRD (end stage renal disease) ([place])    Randy Oneal   Herpes simplex virus (HSV) infection    OU   Hypertension    Hypertensive retinopathy    OU   Retinal detachment    OS   Sepsis due to Randy Oneal, group B (Randy Oneal) 10/05/2018    Past Surgical History:  Procedure Laterality Date   25 GAUGE PARS PLANA VITRECTOMY WITH 20 GAUGE MVR PORT FOR MACULAR HOLE Right 02/24/2019   Procedure: 25 GAUGE PARS PLANA VITRECTOMY WITH 20 GAUGE MVR PORT FOR MACULAR HOLE;  Surgeon: Bernarda Caffey, MD;  Location: [place];  Service: Ophthalmology;  Laterality: Right;   APPLICATION OF WOUND VAC Left 08/27/2018   Procedure: APPLICATION OF WOUND VAC, left neck;  Surgeon: Gaye Pollack, MD;  Location: [place];  Service: Thoracic;  Laterality: Left;   APPLICATION OF WOUND VAC Left 08/30/2018   Procedure:  APPLICATION OF WOUND VAC;  Surgeon: Gaye Pollack, MD;  Location: [place];  Service: Thoracic;  Laterality: Left;   AV FISTULA PLACEMENT Left 02/15/2020   Procedure: LEFT ARM ARTERIOVENOUS (AV) FISTULA CREATION;  Surgeon: Waynetta Sandy, MD;  Location: [place];  Service: Vascular;  Laterality: Left;   CATARACT EXTRACTION Bilateral    CATARACT EXTRACTION W/ INTRAOCULAR LENS  IMPLANT, BILATERAL     COLONOSCOPY  06/24/2011   Procedure: COLONOSCOPY;  Surgeon: [name], MD;  [facility];  Service: Endoscopy;  Laterality: N/A;  8:30 AM   EYE SURGERY     GAS/FLUID EXCHANGE Left 03/31/2019   Procedure: Gas/Fluid Exchange;  Surgeon: Bernarda Caffey, MD;  Location: [place];  Service: Ophthalmology;  Laterality: Left;   INJECTION OF SILICONE OIL  03/24/4817   Procedure: Injection Of Silicone Oil;  Surgeon: Bernarda Caffey, MD;  Location: [place];  Service: Ophthalmology;;   INSERTION OF DIALYSIS CATHETER Right 12/05/2019   Procedure: INSERTION OF DIALYSIS CATHETER RIGHT SUBCLAVIAN;  Surgeon: Virl Cagey, MD;  [facility];  Service: General;  Laterality: Right;   INSERTION OF DIALYSIS CATHETER Right 12/07/2019   Procedure: Minor Dialysis catheter in place- need to reposition/ adjust catheter to help with flows;  Surgeon: Virl Cagey, MD;  Location: [facility];  Service: General;  Laterality: Right;  procedure room case with 1% lidocaine, full sterile drape,  towels, full gown, large chloraprep, 4-0 Monocryl and dermabond    INSERTION OF DIALYSIS CATHETER Right 12/08/2019   Procedure: INSERTION OF DIALYSIS CATHETER EXCHANGE;  Surgeon: Virl Cagey, MD;  [facility];  Service: General;  Laterality: Right;   IR RADIOLOGIST EVAL & MGMT  02/07/2021   MEMBRANE PEEL Right 02/24/2019   Procedure: Randy Oneal;  Surgeon: Bernarda Caffey, MD;  Location: [place];  Service: Ophthalmology;  Laterality: Right;   PARS PLANA VITRECTOMY Left 03/31/2019   Procedure: PARS PLANA VITRECTOMY WITH  25 GAUGE WITH MEMBRANE PEEL;  Surgeon: Bernarda Caffey, MD;  Location: [place];  Service: Ophthalmology;  Laterality: Left;   PHOTOCOAGULATION WITH LASER Right 02/24/2019   Procedure: Photocoagulation With Laser;  Surgeon: Bernarda Caffey, MD;  Location: [place];  Service: Ophthalmology;  Laterality: Right;   PHOTOCOAGULATION WITH LASER Left 03/31/2019   Procedure: Photocoagulation With Laser;  Surgeon: Bernarda Caffey, MD;  Location: [place];  Service: Ophthalmology;  Laterality: Left;   RETINAL DETACHMENT SURGERY Left 03/31/2019   TRD Repair - Dr. Bernarda Caffey   SILICON OIL REMOVAL Right 0/62/3762   Procedure: Silicon Oil Removal;  Surgeon: Bernarda Caffey, MD;  Location: [place];  Service: Ophthalmology;  Laterality: Right;   STERNAL WOUND DEBRIDEMENT Left 08/27/2018   Procedure: Incision and DEBRIDEMENT Left Chest, Neck and Mediastinum;  Surgeon: Gaye Pollack, MD;  Location: [facility];  Service: Thoracic;  Laterality: Left;   STERNAL WOUND DEBRIDEMENT Left 08/30/2018   Procedure: WOUND VAC CHANGE, LEFT CHEST AND NECK, POSSIBLE DEBRIDEMENT;  Surgeon: Gaye Pollack, MD;  Location: [place];  Service: Thoracic;  Laterality: Left;    Allergies: Tramadol  Medications: Prior to Admission medications   Medication Sig Start Date End Date Taking? Authorizing Provider  acetaminophen (TYLENOL) 500 MG tablet Take 1,000 mg by mouth every 6 (six) hours as needed for moderate pain or headache.   Yes [provider]  amLODipine (NORVASC) 2.5 MG tablet TAKE ONE TABLET BY MOUTH ONCE DAILY. Patient taking differently: Take 5 mg by mouth daily. 10/18/20  Yes Luking, [name], MD  amLODipine (NORVASC) 5 MG tablet Take 2.5 mg by mouth in the morning.   Yes [provider]  Bromfenac Sodium (PROLENSA) 0.07 % SOLN Place 1 drop into both eyes 4 (four) times daily. Patient taking differently: Place 1 drop into both eyes 3 (three) times daily. 09/28/20  Yes Bernarda Caffey, MD  cinacalcet (SENSIPAR) 30 MG tablet Take 30  mg by mouth daily with supper. 04/12/20  Yes [provider]  Continuous Blood Gluc Sensor (DEXCOM G6 SENSOR) MISC 1 Device by Does not apply route as directed. 10/10/20  Yes Shamleffer, Melanie Crazier, MD  insulin aspart (NOVOLOG FLEXPEN) 100 UNIT/ML FlexPen Inject 7 Units into the skin 3 (three) times daily with meals. 02/27/21  Yes Shamleffer, Melanie Crazier, MD  lidocaine-prilocaine (EMLA) cream Apply 1 application topically Every Tuesday,Thursday,and Saturday with dialysis. 05/21/20  Yes [provider]  nystatin (MYCOSTATIN/NYSTOP) powder Apply topically 2 (two) times daily. To groin and scrotum Patient taking differently: Apply 1 g topically 2 (two) times daily as needed (rash). To groin and scrotum 09/04/18  Yes Barrett, Erin R, PA-C  prednisoLONE acetate (PRED FORTE) 1 % ophthalmic suspension Place 1 drop into both eyes 4 (four) times daily. Patient taking differently: Place 1 drop into both eyes 3 (three) times daily. 02/08/21  Yes Bernarda Caffey, MD  sucroferric oxyhydroxide (  VELPHORO) 500 MG chewable tablet Chew 1,000 mg by mouth 3 (three) times daily with meals.   Yes [provider]  TRESIBA FLEXTOUCH 100 UNIT/ML FlexTouch Pen Inject 22 Units into the skin at bedtime. 07/12/20  Yes Shamleffer, Melanie Crazier, MD  Continuous Blood Gluc Transmit (DEXCOM G6 TRANSMITTER) MISC 1 Device by Does not apply route as directed. 10/10/20   Shamleffer, Melanie Crazier, MD  glucose blood (ONETOUCH ULTRA) test strip 1 each by Other route in the morning, at noon, in the evening, and at bedtime. Use as instructed 10/10/20   Shamleffer, Melanie Crazier, MD  Insulin Pen Needle 32G X 4 MM MISC 1 Device by Does not apply route in the morning, at noon, in the evening, and at bedtime. 07/12/20   Shamleffer, Melanie Crazier, MD  Methoxy PEG-Epoetin Beta (MIRCERA IJ) Mircera 01/19/21 02/15/22  [provider]  Gary LANCETS MISC USE FOUR TIMES A DAY AS DIRECTED. 11/19/16   [name],  [name], MD     Family History  Problem Relation Age of Onset   Hypertension Mother    Colon cancer Neg Hx     Social History   Socioeconomic History   Marital status: Married    Spouse name: Not on file   Number of children: Not on file   Years of education: Not on file   Highest education level: Not on file  Occupational History   Not on file  Tobacco Use   Smoking status: Never   Smokeless tobacco: Never  Vaping Use   Vaping Use: Never used  Substance and Sexual Activity   Alcohol use: No   Drug use: No   Sexual activity: Not on file  Other Topics Concern   Not on file  Social History Narrative   Not on file   Social Determinants of Health   Financial Resource Strain: Not on file  Food Insecurity: Not on file  Transportation Needs: Not on file  Physical Activity: Not on file  Stress: Not on file  Social Connections: Not on file     Review of Systems: A 12 point ROS discussed and pertinent positives are indicated in the HPI above.  All other systems are negative.  Review of Systems  Skin:  Positive for wound.       Right great toe   Vital Signs: BP (!) 170/99   Pulse 88   Temp 98.2 F (36.8 C) (Oral)   Resp 16   Ht 5\' 11"  (1.803 m)   Wt 87.1 kg   SpO2 100%   BMI 26.78 kg/m   Physical Exam Vitals reviewed.  Constitutional:      Appearance: Normal appearance.  HENT:     Head: Normocephalic and atraumatic.  Eyes:     Extraocular Movements: Extraocular movements intact.  Cardiovascular:     Rate and Rhythm: Normal rate and regular rhythm.     Comments: Palpable femoral pulses bilaterally. Pulmonary:     Effort: Pulmonary effort is normal. No respiratory distress.     Breath sounds: Normal breath sounds.  Abdominal:     Palpations: Abdomen is soft.  Musculoskeletal:        General: Normal range of motion.     Cervical back: Normal range of motion.  Skin:    General: Skin is warm and dry.  Neurological:     General: No focal  deficit present.     Mental Status: He is alert and oriented to person, place, and time.  Psychiatric:  Mood and Affect: Mood normal.        Behavior: Behavior normal.        Thought Content: Thought content normal.        Judgment: Judgment normal.    Imaging: Intravitreal Injection, Pharmacologic Agent - OD - Right Eye  Result Date: 03/08/2021 Time Out 03/08/2021. 3:11 PM. Confirmed correct patient, procedure, site, and patient consented. Anesthesia Topical anesthesia was used. Anesthetic medications included Lidocaine 2%, Proparacaine 0.5%. Procedure Preparation included eyelid speculum, 5% betadine to ocular surface. A (32g) needle was used. Injection: 2 mg aflibercept 2 MG/0.05ML   Route: Intravitreal, Site: Right Eye   NDC: A3590391, Lot: [id number], Expiration date: 12/08/2021, Waste: 0.05 mL Post-op Post injection exam found visual acuity of at least counting fingers. The patient tolerated the procedure well. There were no complications. The patient received written and verbal post procedure care education. Post injection medications were not given.   Intravitreal Injection, Pharmacologic Agent - OS - Left Eye  Result Date: 03/08/2021 Time Out 03/08/2021. 3:12 PM. Confirmed correct patient, procedure, site, and patient consented. Anesthesia Topical anesthesia was used. Anesthetic medications included Lidocaine 2%, Proparacaine 0.5%. Procedure Preparation included 5% betadine to ocular surface, eyelid speculum. A (32g) needle was used. Injection: 2 mg aflibercept 2 MG/0.05ML   Route: Intravitreal, Site: Left Eye   NDC: A3590391, Lot: [id number], Expiration date: 12/08/2021, Waste: 0.05 mL Post-op Post injection exam found visual acuity of at least counting fingers. The patient tolerated the procedure well. There were no complications. The patient received written and verbal post procedure care education. Post injection medications were not given.   OCT, Retina - OU - Both  Eyes  Result Date: 03/08/2021 Right Eye Quality was good. Central Foveal Thickness: 757. Progression has been stable. Findings include preretinal fibrosis, abnormal foveal contour, outer retinal atrophy, epiretinal membrane, intraretinal fluid, no SRF (Persistent IRF -- ?slight improvement). Left Eye Quality was good. Central Foveal Thickness: 706. Progression has been stable. Findings include preretinal fibrosis, intraretinal fluid, abnormal foveal contour, intraretinal hyper-reflective material, epiretinal membrane, no SRF (Persistent IRF and diffuse edema -- slightly improved). Notes *Images captured and stored on drive Diagnosis / Impression: +DME OU OD: Persistent IRF -- ?slight improvement OS: Persistent IRF and diffuse edema -- slightly improved Clinical management: See below Abbreviations: NFP - Normal foveal profile. CME - cystoid macular edema. PED - pigment epithelial detachment. IRF - intraretinal fluid. SRF - subretinal fluid. EZ - ellipsoid zone. ERM - epiretinal membrane. ORA - outer retinal atrophy. ORT - outer retinal tubulation. SRHM - subretinal hyper-reflective material    Labs:  CBC: No results for input(s): WBC, HGB, HCT, PLT in the last 8760 hours.  COAGS: No results for input(s): INR, APTT in the last 8760 hours.  BMP: No results for input(s): NA, K, CL, CO2, GLUCOSE, BUN, CALCIUM, CREATININE, GFRNONAA, GFRAA in the last 8760 hours.  Invalid input(s): CMP  LIVER FUNCTION TESTS: No results for input(s): BILITOT, AST, ALT, ALKPHOS, PROT, ALBUMIN in the last 8760 hours.  TUMOR MARKERS: No results for input(s): AFPTM, CEA, CA199, CHROMGRNA in the last 8760 hours.  Assessment and Plan:  Non-healing right great toe wound, compatible with diabetic foot wound/Rutherford 5 class symptoms of CLI.    Non-invasive lower extremity exam shows non-compressible ABI bilateral ankles.  Risks and benefits of angiography and angioplasty/atherectomy and other endovascular options  including bleeding, infection, contrast reaction, renal injury/nephropathy, arterial injury/dissection, need for additional procedure/surgery, worsening symptoms/tissue including limb loss, cardiopulmonary collapse, death.    Will  proceed with lower extremity angiography and possible angioplasty/revasculariztion today by Randy Oneal.  Thank you for this interesting consult.  I greatly enjoyed meeting Randy Oneal and look forward to participating in their care.  A copy of this report was sent to the requesting provider on this date.  Electronically Signed: Murrell Redden, PA-C   03/15/2021, 7:46 AM      I spent a total of  15 Minutes in face to face in clinical consultation, greater than 50% of which was counseling/coordinating care for lower extremity angiography.

## 2021-03-15 NOTE — Sedation Documentation (Signed)
Transported pt to SS rm 5, bedside report given to [place], as well as phone report. Groin level 0 at handoff. Wife at bedside. Pt has no complaints.

## 2021-03-15 NOTE — Sedation Documentation (Signed)
Patient is resting comfortably. 

## 2021-03-15 NOTE — Procedures (Signed)
Interventional Radiology Procedure Note  Procedure:   US guided right CFA access.  Right lower extremity angiogram, with sub-elective angio of posterior tibial artery/plantar arteries, AT, and peroneal artery.   Failed attempt of crossing of the medial plantar artery from both PT and AT approach Angioseal for closure  Complications: None  Recommendations:  - 4 hours right leg straight - DC 4 hours - advance diet - HD on schedule - routine wound care - follow up on schedule with Dr. Posey Pronto - Do not submerge for 7 days - Post op check with Dr. Earleen Newport in [facility] [place] [facility]  Signed,  Dulcy Fanny. Earleen Newport, DO

## 2021-03-15 NOTE — Progress Notes (Signed)
Discharge instructions reviewed with pt and his wife. Both voice understanding. 

## 2021-03-15 NOTE — Progress Notes (Signed)
Ambulated in hallway tol well no bleeding noted before or after Ambulation.

## 2021-03-15 NOTE — Sedation Documentation (Signed)
Pt is stable is at this time, procedure started

## 2021-03-15 NOTE — Sedation Documentation (Signed)
Report called to [place], Therapist, sports

## 2021-03-15 NOTE — Sedation Documentation (Addendum)
Pt tolerated procedure very well.  42F angio seal to right groin  Totals: Time-70 mins Fentanyl 50 mcg Versed 1 mg Heparin 8000 units

## 2021-03-20 ENCOUNTER — Ambulatory Visit: Payer: Medicare Other | Admitting: Podiatry

## 2021-03-21 ENCOUNTER — Other Ambulatory Visit: Payer: Self-pay | Admitting: Interventional Radiology

## 2021-03-21 DIAGNOSIS — L97522 Non-pressure chronic ulcer of other part of left foot with fat layer exposed: Secondary | ICD-10-CM

## 2021-03-22 ENCOUNTER — Ambulatory Visit (INDEPENDENT_AMBULATORY_CARE_PROVIDER_SITE_OTHER): Payer: Medicare Other | Admitting: Podiatry

## 2021-03-22 ENCOUNTER — Other Ambulatory Visit: Payer: Self-pay

## 2021-03-22 DIAGNOSIS — L97512 Non-pressure chronic ulcer of other part of right foot with fat layer exposed: Secondary | ICD-10-CM | POA: Diagnosis not present

## 2021-03-22 DIAGNOSIS — I999 Unspecified disorder of circulatory system: Secondary | ICD-10-CM

## 2021-03-22 DIAGNOSIS — E1142 Type 2 diabetes mellitus with diabetic polyneuropathy: Secondary | ICD-10-CM | POA: Diagnosis not present

## 2021-03-22 MED ORDER — DOXYCYCLINE HYCLATE 100 MG PO TABS: 100.0000 mg | ORAL_TABLET | Freq: Two times a day (BID) | ORAL | 0 refills | Status: DC

## 2021-03-27 ENCOUNTER — Encounter: Payer: Self-pay | Admitting: Podiatry

## 2021-03-27 NOTE — Progress Notes (Signed)
Subjective:  Patient ID: Randy Oneal, male    DOB: Sep 01, 1965,  MRN: [id number]  Chief Complaint  Patient presents with   Diabetic Ulcer    Follow up right great toe ulcer. Pt states he is doing well. No NV, fever or chills.     55 y.o. male presents for wound care.  Patient presents with follow-up to right hallux IPJ ulceration.  Patient states that he went to see Dr. Earleen Newport who he underwent angiogram with.  He states this feels a lot better.  He does have better flow to the right big toe.  He denies any other acute complaints.  He is not on antibiotics at this time  Review of Systems: Negative except as noted in the HPI. Denies N/V/F/Ch.  Past Medical History:  Diagnosis Date   Anemia    Cataract 07/02/2020   Diabetes mellitus    Type 2    Diabetic retinopathy of both eyes ([place]) 01/31/2019   Dr Coralyn Pear 01/2019   ESRD (end stage renal disease) ([place])    [place] Frenisus   Herpes simplex virus (HSV) infection    OU   Hypertension    Hypertensive retinopathy    OU   Retinal detachment    OS   Sepsis due to Streptococcus, group B ([place]) 10/05/2018    Current Outpatient Medications:    doxycycline (VIBRA-TABS) 100 MG tablet, Take 1 tablet (100 mg total) by mouth 2 (two) times daily., Disp: 60 tablet, Rfl: 0   acetaminophen (TYLENOL) 500 MG tablet, Take 1,000 mg by mouth every 6 (six) hours as needed for moderate pain or headache., Disp: , Rfl:    amLODipine (NORVASC) 2.5 MG tablet, TAKE ONE TABLET BY MOUTH ONCE DAILY. (Patient taking differently: Take 5 mg by mouth daily.), Disp: 30 tablet, Rfl: 0   amLODipine (NORVASC) 5 MG tablet, Take 2.5 mg by mouth in the morning., Disp: , Rfl:    Bromfenac Sodium (PROLENSA) 0.07 % SOLN, Place 1 drop into both eyes 4 (four) times daily. (Patient taking differently: Place 1 drop into both eyes 3 (three) times daily.), Disp: 6 mL, Rfl: 10   cinacalcet (SENSIPAR) 30 MG tablet, Take 30 mg by mouth daily with supper., Disp: , Rfl:    Continuous  Blood Gluc Sensor (DEXCOM G6 SENSOR) MISC, 1 Device by Does not apply route as directed., Disp: 3 each, Rfl: 11   Continuous Blood Gluc Transmit (DEXCOM G6 TRANSMITTER) MISC, 1 Device by Does not apply route as directed., Disp: 1 each, Rfl: 3   glucose blood (ONETOUCH ULTRA) test strip, 1 each by Other route in the morning, at noon, in the evening, and at bedtime. Use as instructed, Disp: 400 strip, Rfl: 3   insulin aspart (NOVOLOG FLEXPEN) 100 UNIT/ML FlexPen, Inject 7 Units into the skin 3 (three) times daily with meals., Disp: 30 mL, Rfl: 3   Insulin Pen Needle 32G X 4 MM MISC, 1 Device by Does not apply route in the morning, at noon, in the evening, and at bedtime., Disp: 400 each, Rfl: 1   lidocaine-prilocaine (EMLA) cream, Apply 1 application topically Every Tuesday,Thursday,and Saturday with dialysis., Disp: , Rfl:    Methoxy PEG-Epoetin Beta (MIRCERA IJ), Mircera, Disp: , Rfl:    MICROLET LANCETS MISC, USE FOUR TIMES A DAY AS DIRECTED., Disp: 100 each, Rfl: 0   nystatin (MYCOSTATIN/NYSTOP) powder, Apply topically 2 (two) times daily. To groin and scrotum (Patient taking differently: Apply 1 g topically 2 (two) times daily as needed (rash).  To groin and scrotum), Disp: 15 g, Rfl: 0   prednisoLONE acetate (PRED FORTE) 1 % ophthalmic suspension, Place 1 drop into both eyes 4 (four) times daily. (Patient taking differently: Place 1 drop into both eyes 3 (three) times daily.), Disp: 15 mL, Rfl: 3   sucroferric oxyhydroxide (VELPHORO) 500 MG chewable tablet, Chew 1,000 mg by mouth 3 (three) times daily with meals., Disp: , Rfl:    TRESIBA FLEXTOUCH 100 UNIT/ML FlexTouch Pen, Inject 22 Units into the skin at bedtime., Disp: 30 mL, Rfl: 1  Social History   Tobacco Use  Smoking Status Never  Smokeless Tobacco Never    Allergies  Allergen Reactions   Tramadol Nausea And Vomiting   Objective:  There were no vitals filed for this visit. There is no height or weight on file to calculate  BMI. Constitutional Well developed. Well nourished.  Vascular Dorsalis pedis pulses faintly palpable bilaterally. Posterior tibial pulses faintly palpable bilaterally. Capillary refill normal to all digits.  No cyanosis or clubbing noted. Pedal hair growth normal.  Neurologic Normal speech. Oriented to person, place, and time. Protective sensation absent  Dermatologic Wound Location: Right hallux IPJ probing down to bone. No clinical signs of infection including erythema/cellulitis noted. Wound Base: Mixed Granular/Fibrotic Peri-wound: Macerated Exudate: Scant/small amount Serous exudate Wound Measurements: -See below  Orthopedic: No pain to palpation either foot.   Radiographs: None Assessment:   1. Vascular abnormality   2. Diabetic polyneuropathy associated with type 2 diabetes mellitus ([place])   3. Skin ulcer of right great toe with fat layer exposed ([place])      Plan:  Patient was evaluated and treated and all questions answered.  Ulcer right hallux nail probing down to bone -Debridement as below. -Dressed with Betadine wet-to-dry, DSD. -Continue off-loading with surgical shoe. -Patient will go back to surgical shoe.  New surgical shoe was dispensed. -The MRI was reviewed with the patient which shows that there might be acute signs of early osteomyelitis in the distal phalanx as well as the proximal phalanx near the interphalangeal joint.  This seems to clinically correlate with the wound.  Given these findings I will discuss possible amputation once restoration of the flow -Patient had vascular intervention done by Dr. Earleen Newport.  He had angioplasty of posterior tibial and plantar arteries, AT and peroneal artery. -We will continue to do local wound care however if there is no resolve meant patient may benefit from an amputation. -I will place him on antibiotics doxycycline and to resolve meant of the wound  Procedure: Excisional Debridement of Wound Tool: Sharp chisel  blade/tissue nipper Rationale: Removal of non-viable soft tissue from the wound to promote healing.  Anesthesia: none Pre-Debridement Wound Measurements: 0.8 cm x 0.3 cm x 0.5 cm  Post-Debridement Wound Measurements: 0.9 cm x 0.3 cm x 0.5 cm  Type of Debridement: Sharp Excisional Tissue Removed: Non-viable soft tissue Blood loss: Minimal (<50cc) Depth of Debridement: subcutaneous tissue. Technique: Sharp excisional debridement to bleeding, viable wound base.  Wound Progress: The wound appears to be stable at this time Dressing: Dry, sterile, compression dressing. Disposition: Patient tolerated procedure well. Patient to return in 1 week for follow-up.  No follow-ups on file.

## 2021-04-05 ENCOUNTER — Encounter (INDEPENDENT_AMBULATORY_CARE_PROVIDER_SITE_OTHER): Payer: Medicare Other | Admitting: Ophthalmology

## 2021-04-10 ENCOUNTER — Encounter (INDEPENDENT_AMBULATORY_CARE_PROVIDER_SITE_OTHER): Payer: Medicare Other | Admitting: Ophthalmology

## 2021-04-11 NOTE — Progress Notes (Signed)
[facility] Note  04/12/2021     CHIEF COMPLAINT Patient presents for Retina Follow Up   HISTORY OF PRESENT ILLNESS: Randy Oneal is a 55 y.o. male who presents to the clinic today for:  HPI     Retina Follow Up   Patient presents with  Diabetic Retinopathy.  In both eyes.  This started 8 weeks ago.  I, the attending physician,  performed the HPI with the patient and updated documentation appropriately.        Comments   Patient here for 8 weeks retina follow up for PDR OU. Patient states vision about the same. Holding its own. No  worse. No eye pain.       Last edited by Bernarda Caffey, MD on 04/12/2021  3:38 PM.    Pt states vision seems to be the same, he recently had a procedure to help the circulation in his legs   Referring physician: [name], MD [address],  [place] [address]  HISTORICAL INFORMATION:   Selected notes from the MEDICAL RECORD NUMBER Referred by Dr.Mark Gershon Crane for concern of decreased vision post cataract sx   CURRENT MEDICATIONS: Current Outpatient Medications (Ophthalmic Drugs)  Medication Sig   Bromfenac Sodium (PROLENSA) 0.07 % SOLN Place 1 drop into both eyes 4 (four) times daily. (Patient taking differently: Place 1 drop into both eyes 3 (three) times daily.)   prednisoLONE acetate (PRED FORTE) 1 % ophthalmic suspension Place 1 drop into both eyes 4 (four) times daily. (Patient taking differently: Place 1 drop into both eyes 3 (three) times daily.)   No current facility-administered medications for this visit. (Ophthalmic Drugs)   Current Outpatient Medications (Other)  Medication Sig   acetaminophen (TYLENOL) 500 MG tablet Take 1,000 mg by mouth every 6 (six) hours as needed for moderate pain or headache.   amLODipine (NORVASC) 2.5 MG tablet TAKE ONE TABLET BY MOUTH ONCE DAILY. (Patient taking differently: Take 5 mg by mouth daily.)   amLODipine (NORVASC) 5 MG tablet Take 2.5 mg by  mouth in the morning.   cinacalcet (SENSIPAR) 30 MG tablet Take 30 mg by mouth daily with supper.   Continuous Blood Gluc Sensor (DEXCOM G6 SENSOR) MISC 1 Device by Does not apply route as directed.   Continuous Blood Gluc Transmit (DEXCOM G6 TRANSMITTER) MISC 1 Device by Does not apply route as directed.   doxycycline (VIBRA-TABS) 100 MG tablet Take 1 tablet (100 mg total) by mouth 2 (two) times daily.   glucose blood (ONETOUCH ULTRA) test strip 1 each by Other route in the morning, at noon, in the evening, and at bedtime. Use as instructed   insulin aspart (NOVOLOG FLEXPEN) 100 UNIT/ML FlexPen Inject 7 Units into the skin 3 (three) times daily with meals.   Insulin Pen Needle 32G X 4 MM MISC 1 Device by Does not apply route in the morning, at noon, in the evening, and at bedtime.   lidocaine-prilocaine (EMLA) cream Apply 1 application topically Every Tuesday,Thursday,and Saturday with dialysis.   Methoxy PEG-Epoetin Beta (MIRCERA IJ) Mircera   MICROLET LANCETS MISC USE FOUR TIMES A DAY AS DIRECTED.   nystatin (MYCOSTATIN/NYSTOP) powder Apply topically 2 (two) times daily. To groin and scrotum (Patient taking differently: Apply 1 g topically 2 (two) times daily as needed (rash). To groin and scrotum)   sucroferric oxyhydroxide (VELPHORO) 500 MG chewable tablet Chew 1,000 mg by mouth 3 (three) times daily with meals.   TRESIBA  FLEXTOUCH 100 UNIT/ML FlexTouch Pen Inject 22 Units into the skin at bedtime.   No current facility-administered medications for this visit. (Other)   REVIEW OF SYSTEMS: ROS   Positive for: Genitourinary, Endocrine, Eyes Negative for: Constitutional, Gastrointestinal, Neurological, Skin, Musculoskeletal, HENT, Cardiovascular, Respiratory, Psychiatric, Allergic/Imm, Heme/Lymph Last edited by Theodore Demark, COA on 04/12/2021  2:12 PM.     ALLERGIES Allergies  Allergen Reactions   Tramadol Nausea And Vomiting    PAST MEDICAL HISTORY Past Medical History:   Diagnosis Date   Anemia    Cataract 07/02/2020   Diabetes mellitus    Type 2    Diabetic retinopathy of both eyes ([place]) 01/31/2019   Dr Coralyn Pear 01/2019   ESRD (end stage renal disease) ([place])    [place] Frenisus   Herpes simplex virus (HSV) infection    OU   Hypertension    Hypertensive retinopathy    OU   Retinal detachment    OS   Sepsis due to Streptococcus, group B ([place]) 10/05/2018   Past Surgical History:  Procedure Laterality Date   25 GAUGE PARS PLANA VITRECTOMY WITH 20 GAUGE MVR PORT FOR MACULAR HOLE Right 02/24/2019   Procedure: 25 GAUGE PARS PLANA VITRECTOMY WITH 20 GAUGE MVR PORT FOR MACULAR HOLE;  Surgeon: Bernarda Caffey, MD;  Location: [facility];  Service: Ophthalmology;  Laterality: Right;   APPLICATION OF WOUND VAC Left 08/27/2018   Procedure: APPLICATION OF WOUND VAC, left neck;  Surgeon: Gaye Pollack, MD;  Location: [facility];  Service: Thoracic;  Laterality: Left;   APPLICATION OF WOUND VAC Left 08/30/2018   Procedure: APPLICATION OF WOUND VAC;  Surgeon: Gaye Pollack, MD;  Location: [facility];  Service: Thoracic;  Laterality: Left;   AV FISTULA PLACEMENT Left 02/15/2020   Procedure: LEFT ARM ARTERIOVENOUS (AV) FISTULA CREATION;  Surgeon: Waynetta Sandy, MD;  Location: [facility];  Service: Vascular;  Laterality: Left;   CATARACT EXTRACTION Bilateral    CATARACT EXTRACTION W/ INTRAOCULAR LENS  IMPLANT, BILATERAL     COLONOSCOPY  06/24/2011   Procedure: COLONOSCOPY;  Surgeon: [name], MD;  [facility];  Service: Endoscopy;  Laterality: N/A;  8:30 AM   EYE SURGERY     GAS/FLUID EXCHANGE Left 03/31/2019   Procedure: Gas/Fluid Exchange;  Surgeon: Bernarda Caffey, MD;  Location: [facility];  Service: Ophthalmology;  Laterality: Left;   INJECTION OF SILICONE OIL  0/24/0973   Procedure: Injection Of Silicone Oil;  Surgeon: Bernarda Caffey, MD;  Location: [facility];  Service: Ophthalmology;;   INSERTION OF DIALYSIS CATHETER Right 12/05/2019   Procedure: INSERTION OF  DIALYSIS CATHETER RIGHT SUBCLAVIAN;  Surgeon: Virl Cagey, MD;  Location: [facility];  Service: General;  Laterality: Right;   INSERTION OF DIALYSIS CATHETER Right 12/07/2019   Procedure: Minor Dialysis catheter in place- need to reposition/ adjust catheter to help with flows;  Surgeon: Virl Cagey, MD;  Location: [facility];  Service: General;  Laterality: Right;  procedure room case with 1% lidocaine, full sterile drape,  towels, full gown, large chloraprep, 4-0 Monocryl and dermabond    INSERTION OF DIALYSIS CATHETER Right 12/08/2019   Procedure: INSERTION OF DIALYSIS CATHETER EXCHANGE;  Surgeon: Virl Cagey, MD;  [facility];  Service: General;  Laterality: Right;   IR ANGIOGRAM EXTREMITY RIGHT  03/15/2021   IR RADIOLOGIST EVAL & MGMT  02/07/2021   IR US GUIDE VASC ACCESS RIGHT  03/15/2021   MEMBRANE PEEL Right 02/24/2019   Procedure: Membrane Peel;  Surgeon: Bernarda Caffey, MD;  Location: [facility];  Service: Ophthalmology;  Laterality: Right;   PARS PLANA VITRECTOMY Left 03/31/2019   Procedure: PARS PLANA VITRECTOMY WITH 25 GAUGE WITH MEMBRANE PEEL;  Surgeon: Bernarda Caffey, MD;  Location: [facility];  Service: Ophthalmology;  Laterality: Left;   PHOTOCOAGULATION WITH LASER Right 02/24/2019   Procedure: Photocoagulation With Laser;  Surgeon: Bernarda Caffey, MD;  Location: [facility];  Service: Ophthalmology;  Laterality: Right;   PHOTOCOAGULATION WITH LASER Left 03/31/2019   Procedure: Photocoagulation With Laser;  Surgeon: Bernarda Caffey, MD;  Location: [facility];  Service: Ophthalmology;  Laterality: Left;   RETINAL DETACHMENT SURGERY Left 03/31/2019   TRD Repair - Dr. Bernarda Caffey   SILICON OIL REMOVAL Right 04/08/5620   Procedure: Silicon Oil Removal;  Surgeon: Bernarda Caffey, MD;  Location: [facility];  Service: Ophthalmology;  Laterality: Right;   STERNAL WOUND DEBRIDEMENT Left 08/27/2018   Procedure: Incision and DEBRIDEMENT Left Chest, Neck and Mediastinum;  Surgeon: Gaye Pollack, MD;  [facility];  Service: Thoracic;  Laterality: Left;   STERNAL WOUND DEBRIDEMENT Left 08/30/2018   Procedure: WOUND VAC CHANGE, LEFT CHEST AND NECK, POSSIBLE DEBRIDEMENT;  Surgeon: Gaye Pollack, MD;  [facility];  Service: Thoracic;  Laterality: Left;    FAMILY HISTORY Family History  Problem Relation Age of Onset   Hypertension Mother    Colon cancer Neg Hx     SOCIAL HISTORY Social History   Tobacco Use   Smoking status: Never   Smokeless tobacco: Never  Vaping Use   Vaping Use: Never used  Substance Use Topics   Alcohol use: No   Drug use: No         OPHTHALMIC EXAM:  Base Eye Exam     Visual Acuity (Snellen - Linear)       Right Left   Dist [place] 20/200 -1 20/200 -2   Dist ph [place] 20/150 20/150 -1         Tonometry (Tonopen, 2:10 PM)       Right Left   Pressure 18 17         Pupils       Dark Light Shape React APD   Right 4 3 Irregular Minimal None   Left 3 2 Irregular Minimal None         Visual Fields       Left Right     Full   Restrictions Partial outer superior temporal, inferior temporal, inferior nasal deficiencies          Extraocular Movement       Right Left    Full Full         Neuro/Psych     Oriented x3: Yes   Mood/Affect: Normal         Dilation     Both eyes: 1.0% Mydriacyl, 2.5% Phenylephrine @ 2:10 PM           Slit Lamp and Fundus Exam     Slit Lamp Exam       Right Left   Lids/Lashes mild Meibomian gland dysfunction mild Meibomian gland dysfunction   Conjunctiva/Sclera subconj silicon oil bubbles, Chemosis, conj Cyst White and quiet   Cornea Trace PEE, Well healed cataract wound, arcus, Debris in tear film Trace PEE, Well healed cataract wound, arcus, Debris in tear film   Anterior Chamber Deep and quiet, no cell or flare Deep and quiet, no cell or flare   Iris slightly irregular dilation, posterior synchiae  from 3:00-1200 round, focal Posterior synechiae at 1030   Lens PC IOL in good position, pigment  deposition, 1+ PCO, mild pigment deposition on optic PC IOL in good position, mild pigment deposition   Vitreous Post vit, silicon oil bubble ~69% post vitrectomy, good silicone oil fill         Fundus Exam       Right Left   Disc 2+pallor, sharp rim, mild fibrosis along ST rim +pallor, sharp rim, fine NVD--regressing, mild fibrosis   C/D Ratio 0.2 0.2   Macula attached under oil, scattered MA/IRH, nasal and central thickening / edema -- slightly improved attached under oil, persistent fibrosis, edema -- improved, focal bands of PRF with traction emanating from ST macula -- stable, cluster of DBH inferior mac -- improved   Vessels attenuated, Tortuous Attenuated, dilated venules, Tortuous, early NVE IT arcades   Periphery Attached, dense 360 PRP laser, mild scattered fibrosis - persistent, Focal ret hole along distal IT arcades--good laser surrounding Attached, good 360 PRP laser changes, pre-retinal fibrosis extending to posterior PRP border superior and inferiorly           IMAGING AND PROCEDURES  Imaging and Procedures for _0 @  OCT, Retina - OU - Both Eyes       Right Eye Quality was good. Central Foveal Thickness: 697. Progression has improved. Findings include preretinal fibrosis, abnormal foveal contour, outer retinal atrophy, epiretinal membrane, intraretinal fluid, no SRF (Mild interval improvement in IRF ).   Left Eye Quality was good. Central Foveal Thickness: 706. Progression has improved. Findings include preretinal fibrosis, intraretinal fluid, abnormal foveal contour, intraretinal hyper-reflective material, epiretinal membrane, no SRF (Mild interval improvement in IRF and edema).   Notes *Images captured and stored on drive  Diagnosis / Impression:  +DME OU OD: Mild interval improvement in IRF  OS: Mild interval improvement in IRF and edema  Clinical management:  See below  Abbreviations: NFP - Normal foveal profile. CME - cystoid macular edema. PED -  pigment epithelial detachment. IRF - intraretinal fluid. SRF - subretinal fluid. EZ - ellipsoid zone. ERM - epiretinal membrane. ORA - outer retinal atrophy. ORT - outer retinal tubulation. SRHM - subretinal hyper-reflective material      Intravitreal Injection, Pharmacologic Agent - OD - Right Eye       Time Out 04/12/2021. 2:54 PM. Confirmed correct patient, procedure, site, and patient consented.   Anesthesia Topical anesthesia was used. Anesthetic medications included Lidocaine 2%, Proparacaine 0.5%.   Procedure Preparation included eyelid speculum, 5% betadine to ocular surface. A (32g) needle was used.   Injection: 2 mg aflibercept 2 MG/0.05ML   Route: Intravitreal, Site: Right Eye   NDC: A3590391, Lot: [id number], Expiration date: 01/07/2022, Waste: 0.05 mL   Post-op Post injection exam found visual acuity of at least counting fingers. The patient tolerated the procedure well. There were no complications. The patient received written and verbal post procedure care education. Post injection medications were not given.      Intravitreal Injection, Pharmacologic Agent - OS - Left Eye       Time Out 04/12/2021. 2:54 PM. Confirmed correct patient, procedure, site, and patient consented.   Anesthesia Topical anesthesia was used. Anesthetic medications included Lidocaine 2%, Proparacaine 0.5%.   Procedure Preparation included 5% betadine to ocular surface, eyelid speculum. A (32g) needle was used.   Injection: 2 mg aflibercept 2 MG/0.05ML   Route: Intravitreal, Site: Left Eye   NDC: M7179715, Lot: [id number], Expiration date: 11/07/2021, Waste: 0.05 mL  Post-op Post injection exam found visual acuity of at least counting fingers. The patient tolerated the procedure well. There were no complications. The patient received written and verbal post procedure care education. Post injection medications were not given.            ASSESSMENT/PLAN:    ICD-10-CM   1.  Proliferative diabetic retinopathy of both eyes with macular edema associated with type 2 diabetes mellitus (HCC)  Z61.0960 Intravitreal Injection, Pharmacologic Agent - OD - Right Eye    Intravitreal Injection, Pharmacologic Agent - OS - Left Eye    aflibercept (EYLEA) SOLN 2 mg    aflibercept (EYLEA) SOLN 2 mg    2. Retinal detachment, tractional, bilateral  H33.43     3. Vitreous hemorrhage of both eyes ([place])  H43.13     4. Retinal edema  H35.81 OCT, Retina - OU - Both Eyes    5. Essential hypertension  I10     6. Hypertensive retinopathy of both eyes  H35.033     7. Pseudophakia of both eyes  Z96.1      1-4. Proliferative diabetic retinopathy with DME, TRD, and vitreous hemorrhage OU (OD > OS)  - last A1c: 7.9 on 7.6.22  - lost to f/u from Jan 2021 to Oct 2021 due to loss of insurance coverage  - pt with complex medical history with hospitalization in Jan-Feb 2020 for bacteremia and abscess  - history of poor glycemic control for years  - s/p IVA OD #1 (06.20.20), #2 (07.01.20), #3 (09.11.20), #4 (10.09.20), #5 (11.06.20), #6 (11.24.21), #7 (12.22.21), #8 (01.19.22)  - s/p IVA OS #1 (06.20.20), #2 (07.01.20), #3 (08.13.20), #4 (09.11.20)  #5 (11.06.20), #6 (11.24.21), #7 (12.22.21), #8 (01.19.22)  - s/p IVE OU #1 (12.04.20) - sample; #2 (01.06.21), #3 (02.18.22), #4 (03.18.22), #5 (04.18.22), #6 (05.25.22), #7 (07.01.22), #8 (07.29.22)  - s/p STK OS #1 (10.09.20)  - s/p PRP OS (06.10.20)  - s/p PPV/PFC/EL/FAX/silicone oil OD, 45.40.98  - s/p subconj silicon oil removal OD 8.20.20  - s/p PPV/EL/FAX/silicone oil OS, 11.91.47  - BCVA 20/200 (worse OD) , 20/200 (improved OS)             - OCT: OD: Mild interval improvement in IRF ; OS: Mild interval improvement in IRF and edema  - recommend IVE OU #9 for DME today, 09.02.22  - RBA of procedure discussed, questions answered  - informed consent obtained and signed  - see procedure note             - Eylea benefits investigation  begun 01.19.22 -- approved  - discussed possible need for PPV w/ membrane peel and silicon oil exchange OS due to worsening fibrosis  - cont topical PF and Prolensa QID OU for possible CME component -- pt given sample of Prolensa in office today  - f/u 4 weeks -- DFE/OCT, possible injection  5,6. Hypertensive retinopathy OU  - discussed importance of tight BP control  - monitor   7. Pseudophakia OU  - s/p CE with IOL Gershon Crane)  Ophthalmic Meds Ordered this visit:  Meds ordered this encounter  Medications   aflibercept (EYLEA) SOLN 2 mg   aflibercept (EYLEA) SOLN 2 mg        Return in about 4 weeks (around 05/10/2021) for f/u PDR OU, DFE, OCT.  There are no Patient Instructions on file for this visit.  This document serves as a record of services personally performed by Gardiner Sleeper, MD, PhD. It was created on  their behalf by Leonie Douglas, an ophthalmic technician. The creation of this record is the provider's dictation and/or activities during the visit.    Electronically signed by: Leonie Douglas COA, 04/12/21  3:46 PM  This document serves as a record of services personally performed by Gardiner Sleeper, MD, PhD. It was created on their behalf by San Jetty. Owens Shark, OA an ophthalmic technician. The creation of this record is the provider's dictation and/or activities during the visit.    Electronically signed by: San Jetty. Marguerita Merles 09.02.2022 3:46 PM  Gardiner Sleeper, M.D., Ph.D. Diseases & Surgery of the Retina and Vitreous [facility] 04/12/2021   I have reviewed the above documentation for accuracy and completeness, and I agree with the above. Gardiner Sleeper, M.D., Ph.D. 04/12/21 3:46 PM  Abbreviations: M myopia (nearsighted); A astigmatism; H hyperopia (farsighted); P presbyopia; Mrx spectacle prescription;  CTL contact lenses; OD right eye; OS left eye; OU both eyes  XT exotropia; ET esotropia; PEK punctate epithelial keratitis; PEE punctate  epithelial erosions; DES dry eye syndrome; MGD meibomian gland dysfunction; ATs artificial tears; PFAT's preservative free artificial tears; [place] nuclear sclerotic cataract; PSC posterior subcapsular cataract; ERM epi-retinal membrane; PVD posterior vitreous detachment; RD retinal detachment; DM diabetes mellitus; DR diabetic retinopathy; NPDR non-proliferative diabetic retinopathy; PDR proliferative diabetic retinopathy; CSME clinically significant macular edema; DME diabetic macular edema; dbh dot blot hemorrhages; CWS cotton wool spot; POAG primary open angle glaucoma; C/D cup-to-disc ratio; HVF humphrey visual field; GVF goldmann visual field; OCT optical coherence tomography; IOP intraocular pressure; BRVO Branch retinal vein occlusion; CRVO central retinal vein occlusion; CRAO central retinal artery occlusion; BRAO branch retinal artery occlusion; RT retinal tear; SB scleral buckle; PPV pars plana vitrectomy; VH Vitreous hemorrhage; PRP panretinal laser photocoagulation; IVK intravitreal kenalog; VMT vitreomacular traction; MH Macular hole;  NVD neovascularization of the disc; NVE neovascularization elsewhere; AREDS age related eye disease study; ARMD age related macular degeneration; POAG primary open angle glaucoma; EBMD epithelial/anterior basement membrane dystrophy; ACIOL anterior chamber intraocular lens; IOL intraocular lens; PCIOL posterior chamber intraocular lens; Phaco/IOL phacoemulsification with intraocular lens placement; [place] photorefractive keratectomy; LASIK laser assisted in situ keratomileusis; HTN hypertension; DM diabetes mellitus; COPD chronic obstructive pulmonary disease

## 2021-04-12 ENCOUNTER — Encounter (INDEPENDENT_AMBULATORY_CARE_PROVIDER_SITE_OTHER): Payer: Self-pay | Admitting: Ophthalmology

## 2021-04-12 ENCOUNTER — Ambulatory Visit (INDEPENDENT_AMBULATORY_CARE_PROVIDER_SITE_OTHER): Payer: Medicare Other | Admitting: Ophthalmology

## 2021-04-12 ENCOUNTER — Other Ambulatory Visit: Payer: Self-pay

## 2021-04-12 DIAGNOSIS — Z961 Presence of intraocular lens: Secondary | ICD-10-CM

## 2021-04-12 DIAGNOSIS — E113513 Type 2 diabetes mellitus with proliferative diabetic retinopathy with macular edema, bilateral: Secondary | ICD-10-CM

## 2021-04-12 DIAGNOSIS — H3343 Traction detachment of retina, bilateral: Secondary | ICD-10-CM | POA: Diagnosis not present

## 2021-04-12 DIAGNOSIS — H35033 Hypertensive retinopathy, bilateral: Secondary | ICD-10-CM | POA: Diagnosis not present

## 2021-04-12 DIAGNOSIS — I10 Essential (primary) hypertension: Secondary | ICD-10-CM | POA: Diagnosis not present

## 2021-04-12 DIAGNOSIS — H4313 Vitreous hemorrhage, bilateral: Secondary | ICD-10-CM | POA: Diagnosis not present

## 2021-04-12 DIAGNOSIS — H3581 Retinal edema: Secondary | ICD-10-CM

## 2021-04-12 MED ORDER — AFLIBERCEPT 2MG/0.05ML IZ SOLN FOR KALEIDOSCOPE
2.0000 mg | INTRAVITREAL | Status: AC | PRN
  Administered 2021-04-12: 2 mg via INTRAVITREAL

## 2021-04-17 ENCOUNTER — Other Ambulatory Visit: Payer: Self-pay

## 2021-04-17 ENCOUNTER — Ambulatory Visit (INDEPENDENT_AMBULATORY_CARE_PROVIDER_SITE_OTHER): Payer: Medicare Other | Admitting: Podiatry

## 2021-04-17 DIAGNOSIS — L97512 Non-pressure chronic ulcer of other part of right foot with fat layer exposed: Secondary | ICD-10-CM

## 2021-04-17 DIAGNOSIS — E1142 Type 2 diabetes mellitus with diabetic polyneuropathy: Secondary | ICD-10-CM | POA: Diagnosis not present

## 2021-04-19 ENCOUNTER — Encounter (INDEPENDENT_AMBULATORY_CARE_PROVIDER_SITE_OTHER): Payer: Medicare Other | Admitting: Ophthalmology

## 2021-04-24 ENCOUNTER — Encounter: Payer: Self-pay | Admitting: Podiatry

## 2021-04-24 NOTE — Progress Notes (Signed)
Subjective:  Patient ID: Randy Oneal, male    DOB: May 27, 1966,  MRN: [id number]  Chief Complaint  Patient presents with   Foot Ulcer    Right hallux toe ulcer     55 y.o. male presents for wound care.  Patient presents with follow-up to right hallux IPJ ulceration.  Patient states is doing well.  He has been keeping a bandage with Betadine wet-to-dry on the right big toe.  He denies any other acute complaints no nausea fever chills vomiting.  Review of Systems: Negative except as noted in the HPI. Denies N/V/F/Ch.  Past Medical History:  Diagnosis Date   Anemia    Cataract 07/02/2020   Diabetes mellitus    Type 2    Diabetic retinopathy of both eyes ([place]) 01/31/2019   Dr Coralyn Pear 01/2019   ESRD (end stage renal disease) ([place])    [place] [facility]   Herpes simplex virus (HSV) infection    OU   Hypertension    Hypertensive retinopathy    OU   Retinal detachment    OS   Sepsis due to Streptococcus, group B ([place]) 10/05/2018    Current Outpatient Medications:    acetaminophen (TYLENOL) 500 MG tablet, Take 1,000 mg by mouth every 6 (six) hours as needed for moderate pain or headache., Disp: , Rfl:    amLODipine (NORVASC) 2.5 MG tablet, TAKE ONE TABLET BY MOUTH ONCE DAILY. (Patient taking differently: Take 5 mg by mouth daily.), Disp: 30 tablet, Rfl: 0   amLODipine (NORVASC) 5 MG tablet, Take 2.5 mg by mouth in the morning., Disp: , Rfl:    Bromfenac Sodium (PROLENSA) 0.07 % SOLN, Place 1 drop into both eyes 4 (four) times daily. (Patient taking differently: Place 1 drop into both eyes 3 (three) times daily.), Disp: 6 mL, Rfl: 10   cinacalcet (SENSIPAR) 30 MG tablet, Take 30 mg by mouth daily with supper., Disp: , Rfl:    Continuous Blood Gluc Sensor (DEXCOM G6 SENSOR) MISC, 1 Device by Does not apply route as directed., Disp: 3 each, Rfl: 11   Continuous Blood Gluc Transmit (DEXCOM G6 TRANSMITTER) MISC, 1 Device by Does not apply route as directed., Disp: 1 each, Rfl: 3    doxycycline (VIBRA-TABS) 100 MG tablet, Take 1 tablet (100 mg total) by mouth 2 (two) times daily., Disp: 60 tablet, Rfl: 0   glucose blood (ONETOUCH ULTRA) test strip, 1 each by Other route in the morning, at noon, in the evening, and at bedtime. Use as instructed, Disp: 400 strip, Rfl: 3   insulin aspart (NOVOLOG FLEXPEN) 100 UNIT/ML FlexPen, Inject 7 Units into the skin 3 (three) times daily with meals., Disp: 30 mL, Rfl: 3   Insulin Pen Needle 32G X 4 MM MISC, 1 Device by Does not apply route in the morning, at noon, in the evening, and at bedtime., Disp: 400 each, Rfl: 1   lidocaine-prilocaine (EMLA) cream, Apply 1 application topically Every Tuesday,Thursday,and Saturday with dialysis., Disp: , Rfl:    Methoxy PEG-Epoetin Beta (MIRCERA IJ), Mircera, Disp: , Rfl:    MICROLET LANCETS MISC, USE FOUR TIMES A DAY AS DIRECTED., Disp: 100 each, Rfl: 0   nystatin (MYCOSTATIN/NYSTOP) powder, Apply topically 2 (two) times daily. To groin and scrotum (Patient taking differently: Apply 1 g topically 2 (two) times daily as needed (rash). To groin and scrotum), Disp: 15 g, Rfl: 0   prednisoLONE acetate (PRED FORTE) 1 % ophthalmic suspension, Place 1 drop into both eyes 4 (four) times daily. (Patient  taking differently: Place 1 drop into both eyes 3 (three) times daily.), Disp: 15 mL, Rfl: 3   sucroferric oxyhydroxide (VELPHORO) 500 MG chewable tablet, Chew 1,000 mg by mouth 3 (three) times daily with meals., Disp: , Rfl:    TRESIBA FLEXTOUCH 100 UNIT/ML FlexTouch Pen, Inject 22 Units into the skin at bedtime., Disp: 30 mL, Rfl: 1  Social History   Tobacco Use  Smoking Status Never  Smokeless Tobacco Never    Allergies  Allergen Reactions   Tramadol Nausea And Vomiting   Objective:  There were no vitals filed for this visit. There is no height or weight on file to calculate BMI. Constitutional Well developed. Well nourished.  Vascular Dorsalis pedis pulses faintly palpable bilaterally. Posterior  tibial pulses faintly palpable bilaterally. Capillary refill normal to all digits.  No cyanosis or clubbing noted. Pedal hair growth normal.  Neurologic Normal speech. Oriented to person, place, and time. Protective sensation absent  Dermatologic Wound Location: Right hallux IPJ probing down to bone. No clinical signs of infection including erythema/cellulitis noted. Wound Base: Mixed Granular/Fibrotic Peri-wound: Macerated Exudate: Scant/small amount Serous exudate Wound Measurements: -See below  Orthopedic: No pain to palpation either foot.   Radiographs: None Assessment:   1. Diabetic polyneuropathy associated with type 2 diabetes mellitus ([place])   2. Skin ulcer of right great toe with fat layer exposed ([place])       Plan:  Patient was evaluated and treated and all questions answered.  Ulcer right hallux nail probing down to bone -Debridement as below. -Dressed with Betadine wet-to-dry, DSD. -Continue off-loading with surgical shoe. -Patient will go back to surgical shoe.  New surgical shoe was dispensed. -The MRI was reviewed with the patient which shows that there might be acute signs of early osteomyelitis in the distal phalanx as well as the proximal phalanx near the interphalangeal joint.  This seems to clinically correlate with the wound.  Given these findings I will discuss possible amputation once restoration of the flow -Patient had vascular intervention done by Dr. Earleen Newport.  He had angioplasty of posterior tibial and plantar arteries, AT and peroneal artery. -I will place him on antibiotics doxycycline and to resolve meant of the wound -Patient would like to proceed with continue to salvage the toe.  I again discussed with him that if this gets acutely infected he will need an amputation he states understanding.  Procedure: Excisional Debridement of Wound Tool: Sharp chisel blade/tissue nipper Rationale: Removal of non-viable soft tissue from the wound to promote  healing.  Anesthesia: none Pre-Debridement Wound Measurements: 0.8 cm x 0.3 cm x 0.5 cm  Post-Debridement Wound Measurements: 0.9 cm x 0.3 cm x 0.5 cm  Type of Debridement: Sharp Excisional Tissue Removed: Non-viable soft tissue Blood loss: Minimal (<50cc) Depth of Debridement: subcutaneous tissue. Technique: Sharp excisional debridement to bleeding, viable wound base.  Wound Progress: The wound appears to be stagnant at this time. Dressing: Dry, sterile, compression dressing. Disposition: Patient tolerated procedure well. Patient to return in 1 week for follow-up.  No follow-ups on file.

## 2021-05-09 ENCOUNTER — Ambulatory Visit
Admission: RE | Admit: 2021-05-09 | Discharge: 2021-05-09 | Disposition: A | Discharge status: Home / Self Care | Payer: Medicare Other | Source: Ambulatory Visit | Attending: Interventional Radiology | Admitting: Interventional Radiology

## 2021-05-09 DIAGNOSIS — L97522 Non-pressure chronic ulcer of other part of left foot with fat layer exposed: Secondary | ICD-10-CM

## 2021-05-09 HISTORY — PX: IR RADIOLOGIST EVAL & MGMT: IMG5224

## 2021-05-09 NOTE — Progress Notes (Signed)
[facility] Note  05/10/2021     CHIEF COMPLAINT Patient presents for Retina Follow Up   HISTORY OF PRESENT ILLNESS: Randy Oneal is a 55 y.o. male who presents to the clinic today for:  HPI     Retina Follow Up   Patient presents with  Diabetic Retinopathy.  In both eyes.  This started 4 weeks ago.  I, the attending physician,  performed the HPI with the patient and updated documentation appropriately.        Comments   Patient here for 4 weeks for retina follow up for PDR OU.  Patient states vision  about the same. Pretty good. No eye pain.       Last edited by Bernarda Caffey, MD on 05/10/2021  3:56 PM.    Pt states vision and health are unchanged.  Referring physician: [name], MD [address] [place],  [place] [address]  HISTORICAL INFORMATION:   Selected notes from the MEDICAL RECORD NUMBER Referred by Dr.Mark Gershon Crane for concern of decreased vision post cataract sx   CURRENT MEDICATIONS: Current Outpatient Medications (Ophthalmic Drugs)  Medication Sig   Bromfenac Sodium (PROLENSA) 0.07 % SOLN Place 1 drop into both eyes 4 (four) times daily. (Patient taking differently: Place 1 drop into both eyes 3 (three) times daily.)   prednisoLONE acetate (PRED FORTE) 1 % ophthalmic suspension Place 1 drop into both eyes 4 (four) times daily. (Patient taking differently: Place 1 drop into both eyes 3 (three) times daily.)   No current facility-administered medications for this visit. (Ophthalmic Drugs)   Current Outpatient Medications (Other)  Medication Sig   acetaminophen (TYLENOL) 500 MG tablet Take 1,000 mg by mouth every 6 (six) hours as needed for moderate pain or headache.   amLODipine (NORVASC) 2.5 MG tablet TAKE ONE TABLET BY MOUTH ONCE DAILY. (Patient taking differently: Take 5 mg by mouth daily.)   amLODipine (NORVASC) 5 MG tablet Take 2.5 mg by mouth in the morning.   cinacalcet (SENSIPAR) 30 MG tablet Take 30 mg  by mouth daily with supper.   Continuous Blood Gluc Sensor (DEXCOM G6 SENSOR) MISC 1 Device by Does not apply route as directed.   Continuous Blood Gluc Transmit (DEXCOM G6 TRANSMITTER) MISC 1 Device by Does not apply route as directed.   doxycycline (VIBRA-TABS) 100 MG tablet Take 1 tablet (100 mg total) by mouth 2 (two) times daily.   glucose blood (ONETOUCH ULTRA) test strip 1 each by Other route in the morning, at noon, in the evening, and at bedtime. Use as instructed   insulin aspart (NOVOLOG FLEXPEN) 100 UNIT/ML FlexPen Inject 7 Units into the skin 3 (three) times daily with meals.   Insulin Pen Needle 32G X 4 MM MISC 1 Device by Does not apply route in the morning, at noon, in the evening, and at bedtime.   lidocaine-prilocaine (EMLA) cream Apply 1 application topically Every Tuesday,Thursday,and Saturday with dialysis.   Methoxy PEG-Epoetin Beta (MIRCERA IJ) Mircera   MICROLET LANCETS MISC USE FOUR TIMES A DAY AS DIRECTED.   nystatin (MYCOSTATIN/NYSTOP) powder Apply topically 2 (two) times daily. To groin and scrotum (Patient taking differently: Apply 1 g topically 2 (two) times daily as needed (rash). To groin and scrotum)   sucroferric oxyhydroxide (VELPHORO) 500 MG chewable tablet Chew 1,000 mg by mouth 3 (three) times daily with meals.   TRESIBA FLEXTOUCH 100 UNIT/ML FlexTouch Pen Inject 22 Units into the skin at bedtime.  No current facility-administered medications for this visit. (Other)   REVIEW OF SYSTEMS: ROS   Positive for: Genitourinary, Endocrine, Eyes Negative for: Constitutional, Gastrointestinal, Neurological, Skin, Musculoskeletal, HENT, Cardiovascular, Respiratory, Psychiatric, Allergic/Imm, Heme/Lymph Last edited by Theodore Demark, COA on 05/10/2021  2:09 PM.      ALLERGIES Allergies  Allergen Reactions   Tramadol Nausea And Vomiting    PAST MEDICAL HISTORY Past Medical History:  Diagnosis Date   Anemia    Cataract 07/02/2020   Diabetes mellitus     Type 2    Diabetic retinopathy of both eyes ([place]) 01/31/2019   Dr Coralyn Pear 01/2019   ESRD (end stage renal disease) ([place])    [place] Frenisus   Herpes simplex virus (HSV) infection    OU   Hypertension    Hypertensive retinopathy    OU   Retinal detachment    OS   Sepsis due to Streptococcus, group B ([place]) 10/05/2018   Past Surgical History:  Procedure Laterality Date   25 GAUGE PARS PLANA VITRECTOMY WITH 20 GAUGE MVR PORT FOR MACULAR HOLE Right 02/24/2019   Procedure: 25 GAUGE PARS PLANA VITRECTOMY WITH 20 GAUGE MVR PORT FOR MACULAR HOLE;  Surgeon: Bernarda Caffey, MD;  Location: [facility];  Service: Ophthalmology;  Laterality: Right;   APPLICATION OF WOUND VAC Left 08/27/2018   Procedure: APPLICATION OF WOUND VAC, left neck;  Surgeon: Gaye Pollack, MD;  Location: [facility];  Service: Thoracic;  Laterality: Left;   APPLICATION OF WOUND VAC Left 08/30/2018   Procedure: APPLICATION OF WOUND VAC;  Surgeon: Gaye Pollack, MD;  Location: [facility];  Service: Thoracic;  Laterality: Left;   AV FISTULA PLACEMENT Left 02/15/2020   Procedure: LEFT ARM ARTERIOVENOUS (AV) FISTULA CREATION;  Surgeon: Waynetta Sandy, MD;  Location: [facility];  Service: Vascular;  Laterality: Left;   CATARACT EXTRACTION Bilateral    CATARACT EXTRACTION W/ INTRAOCULAR LENS  IMPLANT, BILATERAL     COLONOSCOPY  06/24/2011   Procedure: COLONOSCOPY;  Surgeon: [name], MD;  [facility];  Service: Endoscopy;  Laterality: N/A;  8:30 AM   EYE SURGERY     GAS/FLUID EXCHANGE Left 03/31/2019   Procedure: Gas/Fluid Exchange;  Surgeon: Bernarda Caffey, MD;  Location: [facility];  Service: Ophthalmology;  Laterality: Left;   INJECTION OF SILICONE OIL  03/21/5725   Procedure: Injection Of Silicone Oil;  Surgeon: Bernarda Caffey, MD;  Location: [facility];  Service: Ophthalmology;;   INSERTION OF DIALYSIS CATHETER Right 12/05/2019   Procedure: INSERTION OF DIALYSIS CATHETER RIGHT SUBCLAVIAN;  Surgeon: Virl Cagey, MD;  [facility];  Service: General;  Laterality: Right;   INSERTION OF DIALYSIS CATHETER Right 12/07/2019   Procedure: Minor Dialysis catheter in place- need to reposition/ adjust catheter to help with flows;  Surgeon: Virl Cagey, MD;  Location: [facility];  Service: General;  Laterality: Right;  procedure room case with 1% lidocaine, full sterile drape,  towels, full gown, large chloraprep, 4-0 Monocryl and dermabond    INSERTION OF DIALYSIS CATHETER Right 12/08/2019   Procedure: INSERTION OF DIALYSIS CATHETER EXCHANGE;  Surgeon: Virl Cagey, MD;  [facility];  Service: General;  Laterality: Right;   IR ANGIOGRAM EXTREMITY RIGHT  03/15/2021   IR RADIOLOGIST EVAL & MGMT  02/07/2021   IR RADIOLOGIST EVAL & MGMT  05/09/2021   IR US GUIDE VASC ACCESS RIGHT  03/15/2021   MEMBRANE PEEL Right 02/24/2019   Procedure: Antoine Primas;  Surgeon: Bernarda Caffey, MD;  Location: [facility];  Service: Ophthalmology;  Laterality: Right;   PARS PLANA VITRECTOMY Left 03/31/2019   Procedure: PARS PLANA VITRECTOMY WITH 25 GAUGE WITH MEMBRANE PEEL;  Surgeon: Bernarda Caffey, MD;  Location: [facility];  Service: Ophthalmology;  Laterality: Left;   PHOTOCOAGULATION WITH LASER Right 02/24/2019   Procedure: Photocoagulation With Laser;  Surgeon: Bernarda Caffey, MD;  Location: [facility];  Service: Ophthalmology;  Laterality: Right;   PHOTOCOAGULATION WITH LASER Left 03/31/2019   Procedure: Photocoagulation With Laser;  Surgeon: Bernarda Caffey, MD;  Location: [facility];  Service: Ophthalmology;  Laterality: Left;   RETINAL DETACHMENT SURGERY Left 03/31/2019   TRD Repair - Dr. Bernarda Caffey   SILICON OIL REMOVAL Right 3/78/5885   Procedure: Silicon Oil Removal;  Surgeon: Bernarda Caffey, MD;  Location: [facility];  Service: Ophthalmology;  Laterality: Right;   STERNAL WOUND DEBRIDEMENT Left 08/27/2018   Procedure: Incision and DEBRIDEMENT Left Chest, Neck and Mediastinum;  Surgeon: Gaye Pollack, MD;  [facility];  Service: Thoracic;  Laterality: Left;    STERNAL WOUND DEBRIDEMENT Left 08/30/2018   Procedure: WOUND VAC CHANGE, LEFT CHEST AND NECK, POSSIBLE DEBRIDEMENT;  Surgeon: Gaye Pollack, MD;  Location[facility];  Service: Thoracic;  Laterality: Left;    FAMILY HISTORY Family History  Problem Relation Age of Onset   Hypertension Mother    Colon cancer Neg Hx     SOCIAL HISTORY Social History   Tobacco Use   Smoking status: Never   Smokeless tobacco: Never  Vaping Use   Vaping Use: Never used  Substance Use Topics   Alcohol use: No   Drug use: No       OPHTHALMIC EXAM:  Base Eye Exam     Visual Acuity (Snellen - Linear)       Right Left   Dist [place] 20/200 -1 20/200 -1   Dist ph [place] 20/150 20/150 -2         Tonometry (Tonopen, 2:06 PM)       Right Left   Pressure 13 15         Pupils       Dark Light Shape React APD   Right 4 3 Irregular Minimal None   Left 3 2 Irregular Minimal None         Visual Fields       Left Right     Full   Restrictions Partial outer superior temporal, inferior temporal, inferior nasal deficiencies          Extraocular Movement       Right Left    Full Full         Neuro/Psych     Oriented x3: Yes   Mood/Affect: Normal         Dilation     Both eyes: 1.0% Mydriacyl, 2.5% Phenylephrine @ 2:06 PM           Slit Lamp and Fundus Exam     Slit Lamp Exam       Right Left   Lids/Lashes mild Meibomian gland dysfunction mild Meibomian gland dysfunction   Conjunctiva/Sclera subconj silicon oil bubbles, Chemosis, conj Cyst White and quiet   Cornea Trace PEE, Well healed cataract wound, arcus, Debris in tear film Trace PEE, Well healed cataract wound, arcus, Debris in tear film   Anterior Chamber Deep and quiet, no cell or flare Deep and quiet, no cell or flare   Iris slightly irregular dilation, posterior synchiae from 3:00-1200 round, focal Posterior synechiae at  1030   Lens PC IOL in good position, pigment deposition, 1+ PCO, mild pigment deposition on  optic PC IOL in good position, mild pigment deposition   Vitreous Post vit, silicon oil bubble ~87% post vitrectomy, good silicone oil fill         Fundus Exam       Right Left   Disc 2+pallor, sharp rim, mild fibrosis along ST rim +pallor, sharp rim, fine NVD--regressing, mild fibrosis   C/D Ratio 0.2 0.2   Macula attached under oil, scattered MA/IRH, nasal and central thickening / edema -- persistent attached under oil, persistent fibrosis, edema -- improved, focal bands of PRF with traction emanating from ST macula -- stable, focal DBH temporal macula   Vessels attenuated, Tortuous Attenuated, dilated venules, Tortuous, early NVE IT arcades   Periphery Attached, dense 360 PRP laser, mild scattered fibrosis - persistent, Focal ret hole along distal IT arcades--good laser surrounding Attached, good 360 PRP laser changes, pre-retinal fibrosis extending to posterior PRP border superior and inferiorly           IMAGING AND PROCEDURES  Imaging and Procedures for $RemoveBefore'@TODAY'[id number]$ @  OCT, Retina - OU - Both Eyes       Right Eye Quality was good. Central Foveal Thickness: 689. Progression has been stable. Findings include preretinal fibrosis, abnormal foveal contour, outer retinal atrophy, epiretinal membrane, intraretinal fluid, no SRF (Persistent IRF ).   Left Eye Quality was good. Central Foveal Thickness: 625. Progression has been stable. Findings include preretinal fibrosis, intraretinal fluid, abnormal foveal contour, intraretinal hyper-reflective material, epiretinal membrane, no SRF (Persistent IRF and edema).   Notes *Images captured and stored on drive  Diagnosis / Impression:  Persistent DME w/ fibrosis OU  Clinical management:  See below  Abbreviations: NFP - Normal foveal profile. CME - cystoid macular edema. PED - pigment epithelial detachment. IRF - intraretinal fluid. SRF - subretinal fluid. EZ - ellipsoid zone. ERM - epiretinal membrane. ORA - outer retinal atrophy. ORT -  outer retinal tubulation. SRHM - subretinal hyper-reflective material      Intravitreal Injection, Pharmacologic Agent - OD - Right Eye       Time Out 05/10/2021. 2:37 PM. Confirmed correct patient, procedure, site, and patient consented.   Anesthesia Topical anesthesia was used. Anesthetic medications included Lidocaine 2%, Proparacaine 0.5%.   Procedure Preparation included eyelid speculum, 5% betadine to ocular surface. A (32g) needle was used.   Injection: 2 mg aflibercept 2 MG/0.05ML   Route: Intravitreal, Site: Right Eye   NDC: A3590391, Lot: [id number], Expiration date: 03/11/2022, Waste: 0.05 mL   Post-op Post injection exam found visual acuity of at least counting fingers. The patient tolerated the procedure well. There were no complications. The patient received written and verbal post procedure care education. Post injection medications were not given.      Intravitreal Injection, Pharmacologic Agent - OS - Left Eye       Time Out 05/10/2021. 2:37 PM. Confirmed correct patient, procedure, site, and patient consented.   Anesthesia Topical anesthesia was used. Anesthetic medications included Lidocaine 2%, Proparacaine 0.5%.   Procedure Preparation included 5% betadine to ocular surface, eyelid speculum. A (32g) needle was used.   Injection: 2 mg aflibercept 2 MG/0.05ML   Route: Intravitreal, Site: Left Eye   NDC: M7179715, Lot: [id number], Expiration date: 10/09/2021, Waste: 0.05 mL   Post-op Post injection exam found visual acuity of at least counting fingers. The patient tolerated the procedure well. There were no complications. The patient received written and  verbal post procedure care education. Post injection medications were not given.            ASSESSMENT/PLAN:    ICD-10-CM   1. Proliferative diabetic retinopathy of both eyes with macular edema associated with type 2 diabetes mellitus (HCC)  G81.1572 Intravitreal Injection, Pharmacologic  Agent - OD - Right Eye    Intravitreal Injection, Pharmacologic Agent - OS - Left Eye    aflibercept (EYLEA) SOLN 2 mg    aflibercept (EYLEA) SOLN 2 mg    2. Retinal detachment, tractional, bilateral  H33.43     3. Vitreous hemorrhage of both eyes ([place])  H43.13     4. Retinal edema  H35.81 OCT, Retina - OU - Both Eyes    5. Essential hypertension  I10     6. Hypertensive retinopathy of both eyes  H35.033     7. Pseudophakia of both eyes  Z96.1       1-4. Proliferative diabetic retinopathy with DME, TRD, and vitreous hemorrhage OU (OD > OS)  - last A1c: 7.9 on 7.6.22  - lost to f/u from Jan 2021 to Oct 2021 due to loss of insurance coverage  - pt with complex medical history with hospitalization in Jan-Feb 2020 for bacteremia and abscess  - history of poor glycemic control for years  - s/p IVA OD #1 (06.20.20), #2 (07.01.20), #3 (09.11.20), #4 (10.09.20), #5 (11.06.20), #6 (11.24.21), #7 (12.22.21), #8 (01.19.22)  - s/p IVA OS #1 (06.20.20), #2 (07.01.20), #3 (08.13.20), #4 (09.11.20)  #5 (11.06.20), #6 (11.24.21), #7 (12.22.21), #8 (01.19.22)  - s/p IVE OU #1 (12.04.20) - sample; #2 (01.06.21), #3 (02.18.22), #4 (03.18.22), #5 (04.18.22), #6 (05.25.22), #7 (07.01.22), #8 (07.29.22), #9 (09.02.22)  - s/p STK OS #1 (10.09.20)  - s/p PRP OS (06.10.20)  - s/p PPV/PFC/EL/FAX/silicone oil OD, 62.03.55  - s/p subconj silicon oil removal OD 8.20.20  - s/p PPV/EL/FAX/silicone oil OS, 97.41.63  - BCVA 20/200 (worse OD) , 20/200 (improved OS)             - OCT: persistent DME at 4 weeks  - recommend IVE OU #10 for DME today, 09.30.22 with extension to 5 weeks  - RBA of procedure discussed, questions answered  - informed consent obtained and signed  - see procedure note             - Eylea benefits investigation begun 01.19.22 -- approved  - discussed possible need for PPV w/ membrane peel and silicon oil exchange OS due to worsening fibrosis  - cont topical PF and Prolensa QID OU for  possible CME component -- pt given sample of Prolensa in office today  - f/u 5 weeks -- DFE/OCT, possible injection  5,6. Hypertensive retinopathy OU  - discussed importance of tight BP control  - monitor   7. Pseudophakia OU  - s/p CE with IOL Gershon Crane)  Ophthalmic Meds Ordered this visit:  Meds ordered this encounter  Medications   aflibercept (EYLEA) SOLN 2 mg   aflibercept (EYLEA) SOLN 2 mg      Return in about 5 weeks (around 06/14/2021) for f/u PDR OU, DFE, OCT.  There are no Patient Instructions on file for this visit.  This document serves as a record of services personally performed by Gardiner Sleeper, MD, PhD. It was created on their behalf by Leonie Douglas, an ophthalmic technician. The creation of this record is the provider's dictation and/or activities during the visit.    Electronically signed by: Leonie Douglas COA,  05/10/21  4:00 PM  Gardiner Sleeper, M.D., Ph.D. Diseases & Surgery of the Retina and [place] 05/10/2021   I have reviewed the above documentation for accuracy and completeness, and I agree with the above. Gardiner Sleeper, M.D., Ph.D. 05/10/21 4:02 PM   Abbreviations: M myopia (nearsighted); A astigmatism; H hyperopia (farsighted); P presbyopia; Mrx spectacle prescription;  CTL contact lenses; OD right eye; OS left eye; OU both eyes  XT exotropia; ET esotropia; PEK punctate epithelial keratitis; PEE punctate epithelial erosions; DES dry eye syndrome; MGD meibomian gland dysfunction; ATs artificial tears; PFAT's preservative free artificial tears; [place] nuclear sclerotic cataract; PSC posterior subcapsular cataract; ERM epi-retinal membrane; PVD posterior vitreous detachment; RD retinal detachment; DM diabetes mellitus; DR diabetic retinopathy; NPDR non-proliferative diabetic retinopathy; PDR proliferative diabetic retinopathy; CSME clinically significant macular edema; DME diabetic macular edema; dbh dot blot hemorrhages; CWS  cotton wool spot; POAG primary open angle glaucoma; C/D cup-to-disc ratio; HVF humphrey visual field; GVF goldmann visual field; OCT optical coherence tomography; IOP intraocular pressure; BRVO Branch retinal vein occlusion; CRVO central retinal vein occlusion; CRAO central retinal artery occlusion; BRAO branch retinal artery occlusion; RT retinal tear; SB scleral buckle; PPV pars plana vitrectomy; VH Vitreous hemorrhage; PRP panretinal laser photocoagulation; IVK intravitreal kenalog; VMT vitreomacular traction; MH Macular hole;  NVD neovascularization of the disc; NVE neovascularization elsewhere; AREDS age related eye disease study; ARMD age related macular degeneration; POAG primary open angle glaucoma; EBMD epithelial/anterior basement membrane dystrophy; ACIOL anterior chamber intraocular lens; IOL intraocular lens; PCIOL posterior chamber intraocular lens; Phaco/IOL phacoemulsification with intraocular lens placement; [place] photorefractive keratectomy; LASIK laser assisted in situ keratomileusis; HTN hypertension; DM diabetes mellitus; COPD chronic obstructive pulmonary disease

## 2021-05-09 NOTE — Progress Notes (Signed)
Chief Complaint: Right great toe wound   Referring Physician(s): Randy Oneal   History of Present Illness: Randy Oneal is a 55 y.o. male presenting to [facility] today as a scheduled follow up, SP right LE angiogram 03/15/21.   Mr Randy Oneal joins [place] today virtually, with conflict with his HD schedule and COVID situation.  We confirmed his identity with 2 personal identifiers.    Hx:  He has been getting care with Dr. Serita Oneal office, with wound of the right great toe.  Angiogram of the RLE was performed from antegrade approach 03/15/21. His angio showed large caliber patent AT and PT to the ankle.  The plantar arch was occluded, and we were unable to cross on this occasion. .     Interval Hx: Today he tells me that he is doing fine.  He feels good.  He had some post-operative soreness that resolved in 3 days, with no concerns for the access site.   He has continued to see Dr Randy Oneal, last visit 9/7 and upcoming visit in the near future.  He denies any constitutional symptoms, fevers, rigors. He was previously prescribed ABX from Dr. Posey Oneal for concern of osteomyelitis. During debridement, he tells me there is adequate bleeding observed by Dr. Posey Oneal.       Past Medical History:  Diagnosis Date   Anemia    Cataract 07/02/2020   Diabetes mellitus    Type 2    Diabetic retinopathy of both eyes ([place]) 01/31/2019   Dr Randy Oneal 01/2019   ESRD (end stage renal disease) ([place])    [place] Frenisus   Herpes simplex virus (HSV) infection    OU   Hypertension    Hypertensive retinopathy    OU   Retinal detachment    OS   Sepsis due to Streptococcus, group B ([place]) 10/05/2018    Past Surgical History:  Procedure Laterality Date   25 GAUGE PARS PLANA VITRECTOMY WITH 20 GAUGE MVR PORT FOR MACULAR HOLE Right 02/24/2019   Procedure: 25 GAUGE PARS PLANA VITRECTOMY WITH 20 GAUGE MVR PORT FOR MACULAR HOLE;  Surgeon: Randy Caffey, MD;  Location: [place];  Service: Ophthalmology;  Laterality:  Right;   APPLICATION OF WOUND VAC Left 08/27/2018   Procedure: APPLICATION OF WOUND VAC, left neck;  Surgeon: Randy Pollack, MD;  Location: [place];  Service: Thoracic;  Laterality: Left;   APPLICATION OF WOUND VAC Left 08/30/2018   Procedure: APPLICATION OF WOUND VAC;  Surgeon: Randy Pollack, MD;  Location: [place];  Service: Thoracic;  Laterality: Left;   AV FISTULA PLACEMENT Left 02/15/2020   Procedure: LEFT ARM ARTERIOVENOUS (AV) FISTULA CREATION;  Surgeon: Randy Sandy, MD;  Location: [place];  Service: Vascular;  Laterality: Left;   CATARACT EXTRACTION Bilateral    CATARACT EXTRACTION W/ INTRAOCULAR LENS  IMPLANT, BILATERAL     COLONOSCOPY  06/24/2011   Procedure: COLONOSCOPY;  Surgeon: Randy Peng, MD;  [facility];  Service: Endoscopy;  Laterality: N/A;  8:30 AM   EYE SURGERY     GAS/FLUID EXCHANGE Left 03/31/2019   Procedure: Gas/Fluid Exchange;  Surgeon: Randy Caffey, MD;  Location: [place];  Service: Ophthalmology;  Laterality: Left;   INJECTION OF SILICONE OIL  5/91/6384   Procedure: Injection Of Silicone Oil;  Surgeon: Randy Caffey, MD;  Location: [facility];  Service: Ophthalmology;;   INSERTION OF DIALYSIS CATHETER Right 12/05/2019   Procedure: INSERTION OF DIALYSIS CATHETER RIGHT SUBCLAVIAN;  Surgeon: Randy Cagey, MD;  Location: [facility];  Service: General;  Laterality: Right;   INSERTION OF DIALYSIS CATHETER Right 12/07/2019   Procedure: Minor Dialysis catheter in place- need to reposition/ adjust catheter to help with flows;  Surgeon: Randy Cagey, MD;  Location: [facility];  Service: General;  Laterality: Right;  procedure room case with 1% lidocaine, full sterile drape,  towels, full gown, large chloraprep, 4-0 Monocryl and dermabond    INSERTION OF DIALYSIS CATHETER Right 12/08/2019   Procedure: INSERTION OF DIALYSIS CATHETER EXCHANGE;  Surgeon: Randy Cagey, MD;  [facility];  Service: General;  Laterality: Right;   IR ANGIOGRAM EXTREMITY  RIGHT  03/15/2021   IR RADIOLOGIST EVAL & MGMT  02/07/2021   IR US GUIDE VASC ACCESS RIGHT  03/15/2021   MEMBRANE PEEL Right 02/24/2019   Procedure: Randy Oneal;  Surgeon: Randy Caffey, MD;  Location: [place];  Service: Ophthalmology;  Laterality: Right;   PARS PLANA VITRECTOMY Left 03/31/2019   Procedure: PARS PLANA VITRECTOMY WITH 25 GAUGE WITH MEMBRANE PEEL;  Surgeon: Randy Caffey, MD;  Location: [place];  Service: Ophthalmology;  Laterality: Left;   PHOTOCOAGULATION WITH LASER Right 02/24/2019   Procedure: Photocoagulation With Laser;  Surgeon: Randy Caffey, MD;  Location: [facility];  Service: Ophthalmology;  Laterality: Right;   PHOTOCOAGULATION WITH LASER Left 03/31/2019   Procedure: Photocoagulation With Laser;  Surgeon: Randy Caffey, MD;  Location: [place];  Service: Ophthalmology;  Laterality: Left;   RETINAL DETACHMENT SURGERY Left 03/31/2019   TRD Repair - Dr. Bernarda Oneal   SILICON OIL REMOVAL Right 1/69/6789   Procedure: Silicon Oil Removal;  Surgeon: Randy Caffey, MD;  Location: [place];  Service: Ophthalmology;  Laterality: Right;   STERNAL WOUND DEBRIDEMENT Left 08/27/2018   Procedure: Incision and DEBRIDEMENT Left Chest, Neck and Mediastinum;  Surgeon: Randy Pollack, MD;  Location: [facility];  Service: Thoracic;  Laterality: Left;   STERNAL WOUND DEBRIDEMENT Left 08/30/2018   Procedure: WOUND VAC CHANGE, LEFT CHEST AND NECK, POSSIBLE DEBRIDEMENT;  Surgeon: Randy Pollack, MD;  Location: [place];  Service: Thoracic;  Laterality: Left;    Allergies: Tramadol  Medications: Prior to Admission medications   Medication Sig Start Date End Date Taking? Authorizing Provider  acetaminophen (TYLENOL) 500 MG tablet Take 1,000 mg by mouth every 6 (six) hours as needed for moderate pain or headache.    [provider]  amLODipine (NORVASC) 2.5 MG tablet TAKE ONE TABLET BY MOUTH ONCE DAILY. Patient taking differently: Take 5 mg by mouth daily. 10/18/20   [name], MD  amLODipine (NORVASC)  5 MG tablet Take 2.5 mg by mouth in the morning.    [provider]  Bromfenac Sodium (PROLENSA) 0.07 % SOLN Place 1 drop into both eyes 4 (four) times daily. Patient taking differently: Place 1 drop into both eyes 3 (three) times daily. 09/28/20   Randy Caffey, MD  cinacalcet (SENSIPAR) 30 MG tablet Take 30 mg by mouth daily with supper. 04/12/20   [provider]  Continuous Blood Gluc Sensor (DEXCOM G6 SENSOR) MISC 1 Device by Does not apply route as directed. 10/10/20   Shamleffer, Melanie Crazier, MD  Continuous Blood Gluc Transmit (DEXCOM G6 TRANSMITTER) MISC 1 Device by Does not apply route as directed. 10/10/20   Shamleffer, Melanie Crazier, MD  doxycycline (VIBRA-TABS) 100 MG tablet Take 1 tablet (100 mg total) by mouth 2 (two) times daily. 03/22/21   Felipa Furnace, DPM  glucose blood (ONETOUCH ULTRA) test strip 1 each by  Other route in the morning, at noon, in the evening, and at bedtime. Use as instructed 10/10/20   Shamleffer, Melanie Crazier, MD  insulin aspart (NOVOLOG FLEXPEN) 100 UNIT/ML FlexPen Inject 7 Units into the skin 3 (three) times daily with meals. 02/27/21   Shamleffer, Melanie Crazier, MD  Insulin Pen Needle 32G X 4 MM MISC 1 Device by Does not apply route in the morning, at noon, in the evening, and at bedtime. 07/12/20   Shamleffer, Melanie Crazier, MD  lidocaine-prilocaine (EMLA) cream Apply 1 application topically Every Tuesday,Thursday,and Saturday with dialysis. 05/21/20   [provider]  Methoxy PEG-Epoetin Beta (MIRCERA IJ) Mircera 01/19/21 02/15/22  [provider]  MICROLET LANCETS MISC USE FOUR TIMES A DAY AS DIRECTED. 11/19/16   [name], MD  nystatin (MYCOSTATIN/NYSTOP) powder Apply topically 2 (two) times daily. To groin and scrotum Patient taking differently: Apply 1 g topically 2 (two) times daily as needed (rash). To groin and scrotum 09/04/18   Barrett, Erin R, PA-C  prednisoLONE acetate (PRED FORTE) 1 % ophthalmic  suspension Place 1 drop into both eyes 4 (four) times daily. Patient taking differently: Place 1 drop into both eyes 3 (three) times daily. 02/08/21   Randy Caffey, MD  sucroferric oxyhydroxide (VELPHORO) 500 MG chewable tablet Chew 1,000 mg by mouth 3 (three) times daily with meals.    [provider]  TRESIBA FLEXTOUCH 100 UNIT/ML FlexTouch Pen Inject 22 Units into the skin at bedtime. 07/12/20   Shamleffer, Melanie Crazier, MD     Family History  Problem Relation Age of Onset   Hypertension Mother    Colon cancer Neg Hx     Social History   Socioeconomic History   Marital status: Married    Spouse name: Not on file   Number of children: Not on file   Years of education: Not on file   Highest education level: Not on file  Occupational History   Not on file  Tobacco Use   Smoking status: Never   Smokeless tobacco: Never  Vaping Use   Vaping Use: Never used  Substance and Sexual Activity   Alcohol use: No   Drug use: No   Sexual activity: Not on file  Other Topics Concern   Not on file  Social History Narrative   Not on file   Social Determinants of Health   Financial Resource Strain: Not on file  Food Insecurity: Not on file  Transportation Needs: Not on file  Physical Activity: Not on file  Stress: Not on file  Social Connections: Not on file    ECOG Status:   Review of Systems  Review of Systems: A 12 point ROS discussed and pertinent positives are indicated in the HPI above.  All other systems are negative.  Physical Exam No direct physical exam was performed (except for noted visual exam findings with Video Visits).   Vital Signs: There were no vitals taken for this visit.  Imaging: Intravitreal Injection, Pharmacologic Agent - OD - Right Eye  Result Date: 04/12/2021 Time Out 04/12/2021. 2:54 PM. Confirmed correct patient, procedure, site, and patient consented. Anesthesia Topical anesthesia was used. Anesthetic medications included Lidocaine 2%,  Proparacaine 0.5%. Procedure Preparation included eyelid speculum, 5% betadine to ocular surface. A (32g) needle was used. Injection: 2 mg aflibercept 2 MG/0.05ML   Route: Intravitreal, Site: Right Eye   NDC: [id number], Lot: [id number], Expiration date: 01/07/2022, Waste: 0.05 mL Post-op Post injection exam found visual acuity of at least counting fingers. The  patient tolerated the procedure well. There were no complications. The patient received written and verbal post procedure care education. Post injection medications were not given.   Intravitreal Injection, Pharmacologic Agent - OS - Left Eye  Result Date: 04/12/2021 Time Out 04/12/2021. 2:54 PM. Confirmed correct patient, procedure, site, and patient consented. Anesthesia Topical anesthesia was used. Anesthetic medications included Lidocaine 2%, Proparacaine 0.5%. Procedure Preparation included 5% betadine to ocular surface, eyelid speculum. A (32g) needle was used. Injection: 2 mg aflibercept 2 MG/0.05ML   Route: Intravitreal, Site: Left Eye   NDC: M7179715, Lot: [id number], Expiration date: 11/07/2021, Waste: 0.05 mL Post-op Post injection exam found visual acuity of at least counting fingers. The patient tolerated the procedure well. There were no complications. The patient received written and verbal post procedure care education. Post injection medications were not given.   OCT, Retina - OU - Both Eyes  Result Date: 04/12/2021 Right Eye Quality was good. Central Foveal Thickness: 697. Progression has improved. Findings include preretinal fibrosis, abnormal foveal contour, outer retinal atrophy, epiretinal membrane, intraretinal fluid, no SRF (Mild interval improvement in IRF ). Left Eye Quality was good. Central Foveal Thickness: 706. Progression has improved. Findings include preretinal fibrosis, intraretinal fluid, abnormal foveal contour, intraretinal hyper-reflective material, epiretinal membrane, no SRF (Mild interval improvement in IRF and  edema). Notes *Images captured and stored on drive Diagnosis / Impression: +DME OU OD: Mild interval improvement in IRF OS: Mild interval improvement in IRF and edema Clinical management: See below Abbreviations: NFP - Normal foveal profile. CME - cystoid macular edema. PED - pigment epithelial detachment. IRF - intraretinal fluid. SRF - subretinal fluid. EZ - ellipsoid zone. ERM - epiretinal membrane. ORA - outer retinal atrophy. ORT - outer retinal tubulation. SRHM - subretinal hyper-reflective material    Labs:  CBC: Recent Labs    03/15/21 0720  WBC 5.9  HGB 13.1  HCT 41.2  PLT 196    COAGS: Recent Labs    03/15/21 0720  INR 1.0    BMP: Recent Labs    03/15/21 0720  NA 136  K 4.9  CL 93*  CO2 33*  GLUCOSE 86  BUN 51*  CALCIUM 9.7  CREATININE 9.27*  GFRNONAA 6*    LIVER FUNCTION TESTS: No results for input(s): BILITOT, AST, ALT, ALKPHOS, PROT, ALBUMIN in the last 8760 hours.  TUMOR MARKERS: No results for input(s): AFPTM, CEA, CA199, CHROMGRNA in the last 8760 hours.  Assessment and Plan:  Assessment:  Mr Mendonca is a very pleasant 55 yo male with right great toe wound, SP angiogram for the [place] 5 category CLTI.    He has recovered well after the procedure and continues to see Dr. Posey Oneal.    I reinforced medical management, maximal medical therapy for reduction of risk factors is indicated as recommended by updated AHA guidelines1, as  a as well as reinforced continuing excellent foot care for his DM.    I did let him know that we are happy to see him in the future for any further needs in addition to his current excellent wound care.   Annual flu vaccination is also recommended, with Class 1 recommendation1.   Plan: -Maximal medical management.  - We are happy to see him back as needed.     ___________________________________________________________________   1Morley Kos MD, et al. 2016 AHA/ACC Guideline on the Management of Patients With  Lower Extremity Peripheral Artery Disease: Executive Summary: A Report of the American College of Cardiology/American Heart Association Task Force on  Clinical Practice Guidelines. J Am Coll Cardiol. 2017 Mar 21;69(11):1465-1508. doi: 10.1016/j.jacc.2016.11.008.   2 - Norgren L, et al. TASC II Working Group. Inter-society consensus for the management of peripheral arterial disease. Int Tressia Miners. 2007 Jun;26(2):81-157. Review. PubMed PMID: 36144315  3 - Hingorani A, et al. The management of diabetic foot: A clinical practice guideline by the Society for Vascular Surgery in collaboration with the [place] and the Society  for Vascular Medicine. J Vasc Surg. 2016 Feb;63(2 Suppl):3S-21S. doi: 10.1016/j.jvs.2015.10.003. PubMed PMID: 40086761.  4 - Corinna Gab, Saab FA, Luberta Mutter, Grant Ruts, [place]well Poe, Driver VR, [place], Lookstein R, van den Baldemar Lenis, Jaff MR, Guadalupe Dawn, Henao S, AlMahameed A, Katzen B. Digital Subtraction Angiography Prior to an Amputation for Critical Limb Ischemia (CLI): An Expert Recommendation Statement From the CLI Global Society to Optimize Limb Salvage. J Endovasc Ther. 2020 Aug;27(4):540-546. doi: 10.1177/1526602820928590. Epub 2020 May 29. PMID: 95093267.    Electronically Signed: Corrie Mckusick 05/09/2021, 12:30 PM   I spent a total of    10 Minutes in remote  clinical consultation, greater than 50% of which was counseling/coordinating care for right great toe wound, SP right lower extremity angiogram.    Visit type: Audio only (telephone). Audio (no video) only due to patient's lack of internet/smartphone capability. Alternative for in-person consultation at [facility], [address], [place], [place]. This visit type was conducted due to national recommendations for restrictions regarding the COVID-19 Pandemic (e.g. social distancing).  This format is felt to be most appropriate for this patient at this time.  All issues noted in this  document were discussed and addressed.

## 2021-05-10 ENCOUNTER — Ambulatory Visit (INDEPENDENT_AMBULATORY_CARE_PROVIDER_SITE_OTHER): Payer: Medicare Other | Admitting: Ophthalmology

## 2021-05-10 ENCOUNTER — Other Ambulatory Visit: Payer: Self-pay

## 2021-05-10 DIAGNOSIS — E113513 Type 2 diabetes mellitus with proliferative diabetic retinopathy with macular edema, bilateral: Secondary | ICD-10-CM

## 2021-05-10 DIAGNOSIS — H3343 Traction detachment of retina, bilateral: Secondary | ICD-10-CM | POA: Diagnosis not present

## 2021-05-10 DIAGNOSIS — H3581 Retinal edema: Secondary | ICD-10-CM

## 2021-05-10 DIAGNOSIS — I10 Essential (primary) hypertension: Secondary | ICD-10-CM

## 2021-05-10 DIAGNOSIS — H35033 Hypertensive retinopathy, bilateral: Secondary | ICD-10-CM

## 2021-05-10 DIAGNOSIS — H4313 Vitreous hemorrhage, bilateral: Secondary | ICD-10-CM

## 2021-05-10 DIAGNOSIS — Z961 Presence of intraocular lens: Secondary | ICD-10-CM

## 2021-05-10 MED ORDER — AFLIBERCEPT 2MG/0.05ML IZ SOLN FOR KALEIDOSCOPE
2.0000 mg | INTRAVITREAL | Status: AC | PRN
  Administered 2021-05-10: 2 mg via INTRAVITREAL

## 2021-05-15 ENCOUNTER — Other Ambulatory Visit: Payer: Self-pay

## 2021-05-15 ENCOUNTER — Ambulatory Visit (INDEPENDENT_AMBULATORY_CARE_PROVIDER_SITE_OTHER): Payer: Medicare Other | Admitting: Podiatry

## 2021-05-15 DIAGNOSIS — E1142 Type 2 diabetes mellitus with diabetic polyneuropathy: Secondary | ICD-10-CM | POA: Diagnosis not present

## 2021-05-15 DIAGNOSIS — L97512 Non-pressure chronic ulcer of other part of right foot with fat layer exposed: Secondary | ICD-10-CM | POA: Diagnosis not present

## 2021-05-15 MED ORDER — DOXYCYCLINE HYCLATE 100 MG PO TABS: 100.0000 mg | ORAL_TABLET | Freq: Two times a day (BID) | ORAL | 0 refills | Status: DC

## 2021-05-22 DIAGNOSIS — R110 Nausea: Secondary | ICD-10-CM | POA: Insufficient documentation

## 2021-05-23 ENCOUNTER — Encounter: Payer: Self-pay | Admitting: Podiatry

## 2021-05-23 NOTE — Progress Notes (Signed)
Subjective:  Patient ID: Randy Oneal, male    DOB: 08-Aug-1966,  MRN: [id number]  No chief complaint on file.   55 y.o. male presents for wound care.  Patient presents with follow-up to right hallux IPJ ulceration.  Patient states is doing well.  He has been keeping a bandage with Betadine wet-to-dry on the right big toe.  He denies any other acute complaints no nausea fever chills vomiting.  Review of Systems: Negative except as noted in the HPI. Denies N/V/F/Ch.  Past Medical History:  Diagnosis Date   Anemia    Cataract 07/02/2020   Diabetes mellitus    Type 2    Diabetic retinopathy of both eyes ([place]) 01/31/2019   Dr Coralyn Pear 01/2019   ESRD (end stage renal disease) ([place])    [place] Frenisus   Herpes simplex virus (HSV) infection    OU   Hypertension    Hypertensive retinopathy    OU   Retinal detachment    OS   Sepsis due to Streptococcus, group B ([place]) 10/05/2018    Current Outpatient Medications:    acetaminophen (TYLENOL) 500 MG tablet, Take 1,000 mg by mouth every 6 (six) hours as needed for moderate pain or headache., Disp: , Rfl:    amLODipine (NORVASC) 2.5 MG tablet, TAKE ONE TABLET BY MOUTH ONCE DAILY. (Patient taking differently: Take 5 mg by mouth daily.), Disp: 30 tablet, Rfl: 0   amLODipine (NORVASC) 5 MG tablet, Take 2.5 mg by mouth in the morning., Disp: , Rfl:    Bromfenac Sodium (PROLENSA) 0.07 % SOLN, Place 1 drop into both eyes 4 (four) times daily. (Patient taking differently: Place 1 drop into both eyes 3 (three) times daily.), Disp: 6 mL, Rfl: 10   cinacalcet (SENSIPAR) 30 MG tablet, Take 30 mg by mouth daily with supper., Disp: , Rfl:    Continuous Blood Gluc Sensor (DEXCOM G6 SENSOR) MISC, 1 Device by Does not apply route as directed., Disp: 3 each, Rfl: 11   Continuous Blood Gluc Transmit (DEXCOM G6 TRANSMITTER) MISC, 1 Device by Does not apply route as directed., Disp: 1 each, Rfl: 3   doxycycline (VIBRA-TABS) 100 MG tablet, Take 1 tablet (100 mg  total) by mouth 2 (two) times daily., Disp: 60 tablet, Rfl: 0   glucose blood (ONETOUCH ULTRA) test strip, 1 each by Other route in the morning, at noon, in the evening, and at bedtime. Use as instructed, Disp: 400 strip, Rfl: 3   insulin aspart (NOVOLOG FLEXPEN) 100 UNIT/ML FlexPen, Inject 7 Units into the skin 3 (three) times daily with meals., Disp: 30 mL, Rfl: 3   Insulin Pen Needle 32G X 4 MM MISC, 1 Device by Does not apply route in the morning, at noon, in the evening, and at bedtime., Disp: 400 each, Rfl: 1   lidocaine-prilocaine (EMLA) cream, Apply 1 application topically Every Tuesday,Thursday,and Saturday with dialysis., Disp: , Rfl:    Methoxy PEG-Epoetin Beta (MIRCERA IJ), Mircera, Disp: , Rfl:    MICROLET LANCETS MISC, USE FOUR TIMES A DAY AS DIRECTED., Disp: 100 each, Rfl: 0   nystatin (MYCOSTATIN/NYSTOP) powder, Apply topically 2 (two) times daily. To groin and scrotum (Patient taking differently: Apply 1 g topically 2 (two) times daily as needed (rash). To groin and scrotum), Disp: 15 g, Rfl: 0   prednisoLONE acetate (PRED FORTE) 1 % ophthalmic suspension, Place 1 drop into both eyes 4 (four) times daily. (Patient taking differently: Place 1 drop into both eyes 3 (three) times daily.), Disp: 15  mL, Rfl: 3   sucroferric oxyhydroxide (VELPHORO) 500 MG chewable tablet, Chew 1,000 mg by mouth 3 (three) times daily with meals., Disp: , Rfl:    TRESIBA FLEXTOUCH 100 UNIT/ML FlexTouch Pen, Inject 22 Units into the skin at bedtime., Disp: 30 mL, Rfl: 1  Social History   Tobacco Use  Smoking Status Never  Smokeless Tobacco Never    Allergies  Allergen Reactions   Tramadol Nausea And Vomiting   Objective:  There were no vitals filed for this visit. There is no height or weight on file to calculate BMI. Constitutional Well developed. Well nourished.  Vascular Dorsalis pedis pulses faintly palpable bilaterally. Posterior tibial pulses faintly palpable bilaterally. Capillary refill  normal to all digits.  No cyanosis or clubbing noted. Pedal hair growth normal.  Neurologic Normal speech. Oriented to person, place, and time. Protective sensation absent  Dermatologic Wound Location: Right hallux IPJ probing down to bone. No clinical signs of infection including erythema/cellulitis noted. Wound Base: Mixed Granular/Fibrotic Peri-wound: Macerated Exudate: Scant/small amount Serous exudate Wound Measurements: -See below  Orthopedic: No pain to palpation either foot.   Radiographs: None Assessment:   1. Skin ulcer of right great toe with fat layer exposed ([place])   2. Diabetic polyneuropathy associated with type 2 diabetes mellitus ([place])        Plan:  Patient was evaluated and treated and all questions answered.  Ulcer right hallux nail probing down to bone -Debridement as below. -Dressed with Betadine wet-to-dry, DSD. -Continue off-loading with surgical shoe. -Patient will go back to surgical shoe.  New surgical shoe was dispensed. -The MRI was reviewed with the patient which shows that there might be acute signs of early osteomyelitis in the distal phalanx as well as the proximal phalanx near the interphalangeal joint.  This seems to clinically correlate with the wound.  Given these findings I will discuss possible amputation once restoration of the flow -Patient had vascular intervention done by Dr. Earleen Newport.  He had angioplasty of posterior tibial and plantar arteries, AT and peroneal artery. -I will place him on antibiotics doxycycline and to resolve meant of the wound -We will plan on discussing surgical amputation during next clinical visit as it has not gotten much better and is pretty stagnant.  Procedure: Excisional Debridement of Wound Tool: Sharp chisel blade/tissue nipper Rationale: Removal of non-viable soft tissue from the wound to promote healing.  Anesthesia: none Pre-Debridement Wound Measurements: 0.8 cm x 0.3 cm x 0.5 cm  Post-Debridement  Wound Measurements: 0.9 cm x 0.3 cm x 0.5 cm  Type of Debridement: Sharp Excisional Tissue Removed: Non-viable soft tissue Blood loss: Minimal (<50cc) Depth of Debridement: subcutaneous tissue. Technique: Sharp excisional debridement to bleeding, viable wound base.  Wound Progress: The wound appears to be stagnant at this time. Dressing: Dry, sterile, compression dressing. Disposition: Patient tolerated procedure well. Patient to return in 1 week for follow-up.  No follow-ups on file.

## 2021-05-27 ENCOUNTER — Encounter: Payer: Self-pay | Admitting: *Deleted

## 2021-06-05 ENCOUNTER — Ambulatory Visit: Payer: Medicare Other | Admitting: Podiatry

## 2021-06-06 NOTE — Progress Notes (Addendum)
[facility] Note  06/14/2021     CHIEF COMPLAINT Patient presents for Retina Follow Up   HISTORY OF PRESENT ILLNESS: Randy Oneal is a 55 y.o. male who presents to the clinic today for:  HPI     Retina Follow Up   Patient presents with  Diabetic Retinopathy.  In both eyes.  This started 5 weeks ago.  I, the attending physician,  performed the HPI with the patient and updated documentation appropriately.        Comments   Patient here for 5 weeks retina follow up for PDR OU. Patient states vision doing pretty good. About the same. No eye pain.       Last edited by Bernarda Caffey, MD on 06/16/2021  8:19 PM.     Pt states   Referring physician: [name], MD [address] [place],  [place] [id number]  HISTORICAL INFORMATION:   Selected notes from the MEDICAL RECORD NUMBER Referred by Dr.Mark Gershon Crane for concern of decreased vision post cataract sx   CURRENT MEDICATIONS: Current Outpatient Medications (Ophthalmic Drugs)  Medication Sig   Bromfenac Sodium (PROLENSA) 0.07 % SOLN Place 1 drop into both eyes 4 (four) times daily. (Patient taking differently: Place 1 drop into both eyes 3 (three) times daily.)   prednisoLONE acetate (PRED FORTE) 1 % ophthalmic suspension Place 1 drop into both eyes 4 (four) times daily. (Patient taking differently: Place 1 drop into both eyes 3 (three) times daily.)   No current facility-administered medications for this visit. (Ophthalmic Drugs)   Current Outpatient Medications (Other)  Medication Sig   acetaminophen (TYLENOL) 500 MG tablet Take 1,000 mg by mouth every 6 (six) hours as needed for moderate pain or headache.   amLODipine (NORVASC) 2.5 MG tablet TAKE ONE TABLET BY MOUTH ONCE DAILY. (Patient taking differently: Take 5 mg by mouth daily.)   amLODipine (NORVASC) 5 MG tablet Take 2.5 mg by mouth in the morning.   cinacalcet (SENSIPAR) 30 MG tablet Take 30 mg by mouth daily with supper.    Continuous Blood Gluc Sensor (DEXCOM G6 SENSOR) MISC 1 Device by Does not apply route as directed.   Continuous Blood Gluc Transmit (DEXCOM G6 TRANSMITTER) MISC 1 Device by Does not apply route as directed.   doxycycline (VIBRA-TABS) 100 MG tablet Take 1 tablet (100 mg total) by mouth 2 (two) times daily.   glucose blood (ONETOUCH ULTRA) test strip 1 each by Other route in the morning, at noon, in the evening, and at bedtime. Use as instructed   insulin aspart (NOVOLOG FLEXPEN) 100 UNIT/ML FlexPen Inject 7 Units into the skin 3 (three) times daily with meals.   Insulin Pen Needle 32G X 4 MM MISC 1 Device by Does not apply route in the morning, at noon, in the evening, and at bedtime.   lidocaine-prilocaine (EMLA) cream Apply 1 application topically Every Tuesday,Thursday,and Saturday with dialysis.   Methoxy PEG-Epoetin Beta (MIRCERA IJ) Mircera   MICROLET LANCETS MISC USE FOUR TIMES A DAY AS DIRECTED.   nystatin (MYCOSTATIN/NYSTOP) powder Apply topically 2 (two) times daily. To groin and scrotum (Patient taking differently: Apply 1 g topically 2 (two) times daily as needed (rash). To groin and scrotum)   sucroferric oxyhydroxide (VELPHORO) 500 MG chewable tablet Chew 1,000 mg by mouth 3 (three) times daily with meals.   TRESIBA FLEXTOUCH 100 UNIT/ML FlexTouch Pen Inject 22 Units into the skin at bedtime.   No current facility-administered medications for  this visit. (Other)   REVIEW OF SYSTEMS: ROS   Positive for: Genitourinary, Endocrine, Eyes Negative for: Constitutional, Gastrointestinal, Neurological, Skin, Musculoskeletal, HENT, Cardiovascular, Respiratory, Psychiatric, Allergic/Imm, Heme/Lymph Last edited by Theodore Demark, COA on 06/14/2021  2:06 PM.       ALLERGIES Allergies  Allergen Reactions   Tramadol Nausea And Vomiting    PAST MEDICAL HISTORY Past Medical History:  Diagnosis Date   Anemia    Cataract 07/02/2020   Diabetes mellitus    Type 2    Diabetic  retinopathy of both eyes ([place]) 01/31/2019   Dr Coralyn Pear 01/2019   ESRD (end stage renal disease) ([place])    [place] Frenisus   Herpes simplex virus (HSV) infection    OU   Hypertension    Hypertensive retinopathy    OU   Retinal detachment    OS   Sepsis due to Streptococcus, group B ([place]) 10/05/2018   Past Surgical History:  Procedure Laterality Date   25 GAUGE PARS PLANA VITRECTOMY WITH 20 GAUGE MVR PORT FOR MACULAR HOLE Right 02/24/2019   Procedure: 25 GAUGE PARS PLANA VITRECTOMY WITH 20 GAUGE MVR PORT FOR MACULAR HOLE;  Surgeon: Bernarda Caffey, MD;  Location: [facility];  Service: Ophthalmology;  Laterality: Right;   APPLICATION OF WOUND VAC Left 08/27/2018   Procedure: APPLICATION OF WOUND VAC, left neck;  Surgeon: Gaye Pollack, MD;  Location: [facility];  Service: Thoracic;  Laterality: Left;   APPLICATION OF WOUND VAC Left 08/30/2018   Procedure: APPLICATION OF WOUND VAC;  Surgeon: Gaye Pollack, MD;  Location: [facility];  Service: Thoracic;  Laterality: Left;   AV FISTULA PLACEMENT Left 02/15/2020   Procedure: LEFT ARM ARTERIOVENOUS (AV) FISTULA CREATION;  Surgeon: Waynetta Sandy, MD;  Location: [facility];  Service: Vascular;  Laterality: Left;   CATARACT EXTRACTION Bilateral    CATARACT EXTRACTION W/ INTRAOCULAR LENS  IMPLANT, BILATERAL     COLONOSCOPY  06/24/2011   Procedure: COLONOSCOPY;  Surgeon: [name], MD;  [facility];  Service: Endoscopy;  Laterality: N/A;  8:30 AM   EYE SURGERY     GAS/FLUID EXCHANGE Left 03/31/2019   Procedure: Gas/Fluid Exchange;  Surgeon: Bernarda Caffey, MD;  Location: [facility];  Service: Ophthalmology;  Laterality: Left;   INJECTION OF SILICONE OIL  1/61/0960   Procedure: Injection Of Silicone Oil;  Surgeon: Bernarda Caffey, MD;  Location: [facility];  Service: Ophthalmology;;   INSERTION OF DIALYSIS CATHETER Right 12/05/2019   Procedure: INSERTION OF DIALYSIS CATHETER RIGHT SUBCLAVIAN;  Surgeon: Virl Cagey, MD;  [facility];  Service:  General;  Laterality: Right;   INSERTION OF DIALYSIS CATHETER Right 12/07/2019   Procedure: Minor Dialysis catheter in place- need to reposition/ adjust catheter to help with flows;  Surgeon: Virl Cagey, MD;  Location[facility];  Service: General;  Laterality: Right;  procedure room case with 1% lidocaine, full sterile drape,  towels, full gown, large chloraprep, 4-0 Monocryl and dermabond    INSERTION OF DIALYSIS CATHETER Right 12/08/2019   Procedure: INSERTION OF DIALYSIS CATHETER EXCHANGE;  Surgeon: Virl Cagey, MD;  [facility];  Service: General;  Laterality: Right;   IR ANGIOGRAM EXTREMITY RIGHT  03/15/2021   IR RADIOLOGIST EVAL & MGMT  02/07/2021   IR RADIOLOGIST EVAL & MGMT  05/09/2021   IR US GUIDE VASC ACCESS RIGHT  03/15/2021   MEMBRANE PEEL Right 02/24/2019   Procedure: Antoine Primas;  Surgeon: Bernarda Caffey, MD;  Location: [facility];  Service: Ophthalmology;  Laterality: Right;   PARS PLANA VITRECTOMY Left 03/31/2019   Procedure: PARS PLANA VITRECTOMY WITH 25 GAUGE WITH MEMBRANE PEEL;  Surgeon: Bernarda Caffey, MD;  Location: [facility];  Service: Ophthalmology;  Laterality: Left;   PHOTOCOAGULATION WITH LASER Right 02/24/2019   Procedure: Photocoagulation With Laser;  Surgeon: Bernarda Caffey, MD;  Location: [facility];  Service: Ophthalmology;  Laterality: Right;   PHOTOCOAGULATION WITH LASER Left 03/31/2019   Procedure: Photocoagulation With Laser;  Surgeon: Bernarda Caffey, MD;  Location: [facility];  Service: Ophthalmology;  Laterality: Left;   RETINAL DETACHMENT SURGERY Left 03/31/2019   TRD Repair - Dr. Bernarda Caffey   SILICON OIL REMOVAL Right 9/52/8413   Procedure: Silicon Oil Removal;  Surgeon: Bernarda Caffey, MD;  Location: [facility];  Service: Ophthalmology;  Laterality: Right;   STERNAL WOUND DEBRIDEMENT Left 08/27/2018   Procedure: Incision and DEBRIDEMENT Left Chest, Neck and Mediastinum;  Surgeon: Gaye Pollack, MD;  [facility];  Service: Thoracic;  Laterality: Left;   STERNAL WOUND  DEBRIDEMENT Left 08/30/2018   Procedure: WOUND VAC CHANGE, LEFT CHEST AND NECK, POSSIBLE DEBRIDEMENT;  Surgeon: Gaye Pollack, MD;  [facility];  Service: Thoracic;  Laterality: Left;    FAMILY HISTORY Family History  Problem Relation Age of Onset   Hypertension Mother    Colon cancer Neg Hx     SOCIAL HISTORY Social History   Tobacco Use   Smoking status: Never   Smokeless tobacco: Never  Vaping Use   Vaping Use: Never used  Substance Use Topics   Alcohol use: No   Drug use: No       OPHTHALMIC EXAM:  Base Eye Exam     Visual Acuity (Snellen - Linear)       Right Left   Dist [place] 20/200 +2 20/200 -2   Dist ph [place] 20/150 -1 20/150 -2         Tonometry (Tonopen, 2:04 PM)       Right Left   Pressure 21 19         Pupils       Dark Light Shape React APD   Right 4 3 Irregular Minimal None   Left 3 2 Irregular Minimal None         Visual Fields       Left Right     Full   Restrictions Partial outer superior temporal, inferior temporal, inferior nasal deficiencies          Extraocular Movement       Right Left    Full Full         Neuro/Psych     Oriented x3: Yes   Mood/Affect: Normal         Dilation     Both eyes: 1.0% Mydriacyl, 2.5% Phenylephrine @ 2:04 PM           Slit Lamp and Fundus Exam     Slit Lamp Exam       Right Left   Lids/Lashes mild Meibomian gland dysfunction mild Meibomian gland dysfunction   Conjunctiva/Sclera subconj silicon oil bubbles, Chemosis, conj Cyst White and quiet   Cornea Trace PEE, Well healed cataract wound, arcus, Debris in tear film Trace PEE, Well healed cataract wound, arcus, Debris in tear film   Anterior Chamber Deep and quiet, no cell or flare Deep and quiet, no cell or flare   Iris slightly irregular dilation, posterior synchiae from 3:00-1200 round, focal Posterior synechiae at 1030  Lens PC IOL in good position, pigment deposition, 1+ PCO, mild pigment deposition on optic PC IOL  in good position, mild pigment deposition   Vitreous Post vit, silicon oil bubble ~37% post vitrectomy, good silicone oil fill         Fundus Exam       Right Left   Disc 2+pallor, sharp rim, mild fibrosis along ST rim +pallor, sharp rim, fine NVD--regressing, mild fibrosis   C/D Ratio 0.2 0.2   Macula attached under oil, scattered MA/IRH, nasal and central thickening / edema -- persistent, slightly improved attached under oil, persistent fibrosis, edema -- improved, focal bands of PRF with traction emanating from ST macula -- stable, focal DBH temporal macula   Vessels attenuated, Tortuous Attenuated, dilated venules, Tortuous, early NVE IT arcades   Periphery Attached, dense 360 PRP laser, mild scattered fibrosis - persistent, Focal ret hole along distal IT arcades--good laser surrounding Attached, good 360 PRP laser changes, pre-retinal fibrosis extending to posterior PRP border superior and inferiorly           IMAGING AND PROCEDURES  Imaging and Procedures for $RemoveBefore'@TODAY'[id number]$ @  OCT, Retina - OU - Both Eyes       Right Eye Quality was good. Central Foveal Thickness: 609. Progression has improved. Findings include preretinal fibrosis, abnormal foveal contour, outer retinal atrophy, epiretinal membrane, intraretinal fluid, no SRF (Interval improvement in IRF and central thickening).   Left Eye Quality was good. Central Foveal Thickness: 542. Progression has improved. Findings include preretinal fibrosis, intraretinal fluid, abnormal foveal contour, intraretinal hyper-reflective material, epiretinal membrane, no SRF (Mild interval improvement in central thickening and IRF ).   Notes *Images captured and stored on drive  Diagnosis / Impression:  OS: Interval improvement in IRF and central thickening OS: Mild interval improvement in central thickening and IRF   Clinical management:  See below  Abbreviations: NFP - Normal foveal profile. CME - cystoid macular edema. PED - pigment  epithelial detachment. IRF - intraretinal fluid. SRF - subretinal fluid. EZ - ellipsoid zone. ERM - epiretinal membrane. ORA - outer retinal atrophy. ORT - outer retinal tubulation. SRHM - subretinal hyper-reflective material      Intravitreal Injection, Pharmacologic Agent - OD - Right Eye       Time Out 06/14/2021. 2:58 PM. Confirmed correct patient, procedure, site, and patient consented.   Anesthesia Topical anesthesia was used. Anesthetic medications included Lidocaine 2%, Proparacaine 0.5%.   Procedure Preparation included eyelid speculum, 5% betadine to ocular surface. A (32g) needle was used.   Injection: 2 mg aflibercept 2 MG/0.05ML   Route: Intravitreal, Site: Right Eye   NDC: A3590391, Lot: [id number], Expiration date: 05/10/2022, Waste: 0.05 mL   Post-op Post injection exam found visual acuity of at least counting fingers. The patient tolerated the procedure well. There were no complications. The patient received written and verbal post procedure care education. Post injection medications were not given.      Intravitreal Injection, Pharmacologic Agent - OS - Left Eye       Time Out 06/14/2021. 2:59 PM. Confirmed correct patient, procedure, site, and patient consented.   Anesthesia Topical anesthesia was used. Anesthetic medications included Lidocaine 2%, Proparacaine 0.5%.   Procedure Preparation included 5% betadine to ocular surface, eyelid speculum. A (32g) needle was used.   Injection: 2 mg aflibercept 2 MG/0.05ML   Route: Intravitreal, Site: Left Eye   NDC: A3590391, Lot: [id number], Expiration date: 04/10/2022, Waste: 0.05 mL   Post-op Post injection exam found visual acuity of  at least counting fingers. The patient tolerated the procedure well. There were no complications. The patient received written and verbal post procedure care education. Post injection medications were not given.             ASSESSMENT/PLAN:    ICD-10-CM   1.  Proliferative diabetic retinopathy of both eyes with macular edema associated with type 2 diabetes mellitus (HCC)  J28.7867 Intravitreal Injection, Pharmacologic Agent - OD - Right Eye    Intravitreal Injection, Pharmacologic Agent - OS - Left Eye    aflibercept (EYLEA) SOLN 2 mg    aflibercept (EYLEA) SOLN 2 mg    2. Retinal detachment, tractional, bilateral  H33.43     3. Vitreous hemorrhage of both eyes ([place])  H43.13     4. Retinal edema  H35.81 OCT, Retina - OU - Both Eyes    5. Essential hypertension  I10     6. Hypertensive retinopathy of both eyes  H35.033     7. Pseudophakia of both eyes  Z96.1      1-4. Proliferative diabetic retinopathy with DME, TRD, and vitreous hemorrhage OU (OD > OS)  - last A1c: 7.9 on 7.6.22  - lost to f/u from Jan 2021 to Oct 2021 due to loss of insurance coverage  - pt with complex medical history with hospitalization in Jan-Feb 2020 for bacteremia and abscess  - history of poor glycemic control for years  - s/p IVA OD #1 (06.20.20), #2 (07.01.20), #3 (09.11.20), #4 (10.09.20), #5 (11.06.20), #6 (11.24.21), #7 (12.22.21), #8 (01.19.22)  - s/p IVA OS #1 (06.20.20), #2 (07.01.20), #3 (08.13.20), #4 (09.11.20)  #5 (11.06.20), #6 (11.24.21), #7 (12.22.21), #8 (01.19.22)  - s/p IVE OU #1 (12.04.20) - sample; #2 (01.06.21), #3 (02.18.22), #4 (03.18.22), #5 (04.18.22), #6 (05.25.22), #7 (07.01.22), #8 (07.29.22), #9 (09.02.22), #10 (09.30.22)  - s/p STK OS #1 (10.09.20)  - s/p PRP OS (06.10.20)  - s/p PPV/PFC/EL/FAX/silicone oil OD, 67.20.94  - s/p subconj silicon oil removal OD 8.20.20  - s/p PPV/EL/FAX/silicone oil OS, 70.96.28  - BCVA 20/150 OU --stable             - OCT: interval improvement in IRF/central retinal thickening at 5 weeks  - recommend IVE OU #11 for DME today, 11.04.22 with f/u at 5 weeks  - RBA of procedure discussed, questions answered  - informed consent obtained and signed  - see procedure note             - Eylea benefits  investigation begun 01.19.22 -- approved  - discussed possible need for PPV w/ membrane peel and silicon oil exchange OS due to worsening fibrosis  - cont topical PF and Prolensa QID OU for possible CME component -- pt given sample of Prolensa in office today  - f/u 5 weeks -- DFE/OCT, possible injection  5,6. Hypertensive retinopathy OU  - discussed importance of tight BP control  - monitor   7. Pseudophakia OU  - s/p CE with IOL (Dr. Gershon Crane)  Ophthalmic Meds Ordered this visit:  Meds ordered this encounter  Medications   aflibercept (EYLEA) SOLN 2 mg   aflibercept (EYLEA) SOLN 2 mg     Return in about 5 weeks (around 07/19/2021) for f/u PDR OU, DFE, OCT.  There are no Patient Instructions on file for this visit.  This document serves as a record of services personally performed by Gardiner Sleeper, MD, PhD. It was created on their behalf by Leonie Douglas, an ophthalmic technician. The creation  of this record is the provider's dictation and/or activities during the visit.    Electronically signed by: Leonie Douglas COA, 06/16/21  8:26 PM  Gardiner Sleeper, M.D., Ph.D. Diseases & Surgery of the Retina and [place] 06/14/2021   I have reviewed the above documentation for accuracy and completeness, and I agree with the above. Gardiner Sleeper, M.D., Ph.D. 06/16/21 8:27 PM   Abbreviations: M myopia (nearsighted); A astigmatism; H hyperopia (farsighted); P presbyopia; Mrx spectacle prescription;  CTL contact lenses; OD right eye; OS left eye; OU both eyes  XT exotropia; ET esotropia; PEK punctate epithelial keratitis; PEE punctate epithelial erosions; DES dry eye syndrome; MGD meibomian gland dysfunction; ATs artificial tears; PFAT's preservative free artificial tears; [place] nuclear sclerotic cataract; PSC posterior subcapsular cataract; ERM epi-retinal membrane; PVD posterior vitreous detachment; RD retinal detachment; DM diabetes mellitus; DR diabetic  retinopathy; NPDR non-proliferative diabetic retinopathy; PDR proliferative diabetic retinopathy; CSME clinically significant macular edema; DME diabetic macular edema; dbh dot blot hemorrhages; CWS cotton wool spot; POAG primary open angle glaucoma; C/D cup-to-disc ratio; HVF humphrey visual field; GVF goldmann visual field; OCT optical coherence tomography; IOP intraocular pressure; BRVO Branch retinal vein occlusion; CRVO central retinal vein occlusion; CRAO central retinal artery occlusion; BRAO branch retinal artery occlusion; RT retinal tear; SB scleral buckle; PPV pars plana vitrectomy; VH Vitreous hemorrhage; PRP panretinal laser photocoagulation; IVK intravitreal kenalog; VMT vitreomacular traction; MH Macular hole;  NVD neovascularization of the disc; NVE neovascularization elsewhere; AREDS age related eye disease study; ARMD age related macular degeneration; POAG primary open angle glaucoma; EBMD epithelial/anterior basement membrane dystrophy; ACIOL anterior chamber intraocular lens; IOL intraocular lens; PCIOL posterior chamber intraocular lens; Phaco/IOL phacoemulsification with intraocular lens placement; [place] photorefractive keratectomy; LASIK laser assisted in situ keratomileusis; HTN hypertension; DM diabetes mellitus; COPD chronic obstructive pulmonary disease

## 2021-06-14 ENCOUNTER — Other Ambulatory Visit: Payer: Self-pay

## 2021-06-14 ENCOUNTER — Encounter (INDEPENDENT_AMBULATORY_CARE_PROVIDER_SITE_OTHER): Payer: Self-pay | Admitting: Ophthalmology

## 2021-06-14 ENCOUNTER — Ambulatory Visit (INDEPENDENT_AMBULATORY_CARE_PROVIDER_SITE_OTHER): Payer: Medicare Other | Admitting: Ophthalmology

## 2021-06-14 DIAGNOSIS — H3343 Traction detachment of retina, bilateral: Secondary | ICD-10-CM | POA: Diagnosis not present

## 2021-06-14 DIAGNOSIS — E113513 Type 2 diabetes mellitus with proliferative diabetic retinopathy with macular edema, bilateral: Secondary | ICD-10-CM

## 2021-06-14 DIAGNOSIS — I10 Essential (primary) hypertension: Secondary | ICD-10-CM

## 2021-06-14 DIAGNOSIS — H35033 Hypertensive retinopathy, bilateral: Secondary | ICD-10-CM

## 2021-06-14 DIAGNOSIS — H3581 Retinal edema: Secondary | ICD-10-CM

## 2021-06-14 DIAGNOSIS — H4313 Vitreous hemorrhage, bilateral: Secondary | ICD-10-CM

## 2021-06-14 DIAGNOSIS — Z961 Presence of intraocular lens: Secondary | ICD-10-CM

## 2021-06-16 ENCOUNTER — Encounter (INDEPENDENT_AMBULATORY_CARE_PROVIDER_SITE_OTHER): Payer: Self-pay | Admitting: Ophthalmology

## 2021-06-16 MED ORDER — AFLIBERCEPT 2MG/0.05ML IZ SOLN FOR KALEIDOSCOPE
2.0000 mg | INTRAVITREAL | Status: AC | PRN
  Administered 2021-06-14: 2 mg via INTRAVITREAL

## 2021-07-12 NOTE — Progress Notes (Signed)
[facility] Note  07/19/2021     CHIEF COMPLAINT Patient presents for Retina Follow Up   HISTORY OF PRESENT ILLNESS: Randy Oneal is a 55 y.o. male who presents to the clinic today for:  HPI     Retina Follow Up   Patient presents with  Diabetic Retinopathy.  In both eyes.  This started 5 weeks ago.  I, the attending physician,  performed the HPI with the patient and updated documentation appropriately.        Comments   Patient here for 5 weeks retina follow up for PDR OU. Patient states vision doing pretty good. No eye pain.       Last edited by Bernarda Caffey, MD on 07/21/2021  1:48 AM.    Pt feels like he may be seeing slightly better OU.  BP and BS have been under good control.  Referring physician: Rutherford Guys, [place],  [place] [id number]  HISTORICAL INFORMATION:   Selected notes from the MEDICAL RECORD NUMBER Referred by Dr.Mark Gershon Crane for concern of decreased vision post cataract sx   CURRENT MEDICATIONS: Current Outpatient Medications (Ophthalmic Drugs)  Medication Sig   Bromfenac Sodium (PROLENSA) 0.07 % SOLN Place 1 drop into both eyes 4 (four) times daily. (Patient taking differently: Place 1 drop into both eyes 3 (three) times daily.)   prednisoLONE acetate (PRED FORTE) 1 % ophthalmic suspension Place 1 drop into both eyes 4 (four) times daily. (Patient taking differently: Place 1 drop into both eyes 3 (three) times daily.)   No current facility-administered medications for this visit. (Ophthalmic Drugs)   Current Outpatient Medications (Other)  Medication Sig   acetaminophen (TYLENOL) 500 MG tablet Take 1,000 mg by mouth every 6 (six) hours as needed for moderate pain or headache.   amLODipine (NORVASC) 2.5 MG tablet TAKE ONE TABLET BY MOUTH ONCE DAILY. (Patient taking differently: Take 5 mg by mouth daily.)   amLODipine (NORVASC) 5 MG tablet Take 2.5 mg by mouth in the morning.   cinacalcet  (SENSIPAR) 30 MG tablet Take 30 mg by mouth daily with supper.   Continuous Blood Gluc Sensor (DEXCOM G6 SENSOR) MISC 1 Device by Does not apply route as directed.   Continuous Blood Gluc Transmit (DEXCOM G6 TRANSMITTER) MISC 1 Device by Does not apply route as directed.   doxycycline (VIBRA-TABS) 100 MG tablet Take 1 tablet (100 mg total) by mouth 2 (two) times daily.   glucose blood (ONETOUCH ULTRA) test strip 1 each by Other route in the morning, at noon, in the evening, and at bedtime. Use as instructed   insulin aspart (NOVOLOG FLEXPEN) 100 UNIT/ML FlexPen Inject 7 Units into the skin 3 (three) times daily with meals.   Insulin Pen Needle 32G X 4 MM MISC 1 Device by Does not apply route in the morning, at noon, in the evening, and at bedtime.   lidocaine-prilocaine (EMLA) cream Apply 1 application topically Every Tuesday,Thursday,and Saturday with dialysis.   Methoxy PEG-Epoetin Beta (MIRCERA IJ) Mircera   MICROLET LANCETS MISC USE FOUR TIMES A DAY AS DIRECTED.   nystatin (MYCOSTATIN/NYSTOP) powder Apply topically 2 (two) times daily. To groin and scrotum (Patient taking differently: Apply 1 g topically 2 (two) times daily as needed (rash). To groin and scrotum)   sucroferric oxyhydroxide (VELPHORO) 500 MG chewable tablet Chew 1,000 mg by mouth 3 (three) times daily with meals.   TRESIBA FLEXTOUCH 100 UNIT/ML FlexTouch Pen Inject 22 Units into the  skin at bedtime.   No current facility-administered medications for this visit. (Other)   REVIEW OF SYSTEMS: ROS   Positive for: Genitourinary, Endocrine, Eyes Negative for: Constitutional, Gastrointestinal, Neurological, Skin, Musculoskeletal, HENT, Cardiovascular, Respiratory, Psychiatric, Allergic/Imm, Heme/Lymph Last edited by Theodore Demark, COA on 07/19/2021  2:11 PM.     ALLERGIES Allergies  Allergen Reactions   Tramadol Nausea And Vomiting    PAST MEDICAL HISTORY Past Medical History:  Diagnosis Date   Anemia    Cataract  07/02/2020   Diabetes mellitus    Type 2    Diabetic retinopathy of both eyes ([place]) 01/31/2019   Dr Coralyn Pear 01/2019   ESRD (end stage renal disease) ([place])    [place] Frenisus   Herpes simplex virus (HSV) infection    OU   Hypertension    Hypertensive retinopathy    OU   Retinal detachment    OS   Sepsis due to Streptococcus, group B ([place]) 10/05/2018   Past Surgical History:  Procedure Laterality Date   25 GAUGE PARS PLANA VITRECTOMY WITH 20 GAUGE MVR PORT FOR MACULAR HOLE Right 02/24/2019   Procedure: 25 GAUGE PARS PLANA VITRECTOMY WITH 20 GAUGE MVR PORT FOR MACULAR HOLE;  Surgeon: Bernarda Caffey, MD;  Location: [facility];  Service: Ophthalmology;  Laterality: Right;   APPLICATION OF WOUND VAC Left 08/27/2018   Procedure: APPLICATION OF WOUND VAC, left neck;  Surgeon: Gaye Pollack, MD;  Location: [place];  Service: Thoracic;  Laterality: Left;   APPLICATION OF WOUND VAC Left 08/30/2018   Procedure: APPLICATION OF WOUND VAC;  Surgeon: Gaye Pollack, MD;  Location: [place];  Service: Thoracic;  Laterality: Left;   AV FISTULA PLACEMENT Left 02/15/2020   Procedure: LEFT ARM ARTERIOVENOUS (AV) FISTULA CREATION;  Surgeon: Waynetta Sandy, MD;  Location: [place];  Service: Vascular;  Laterality: Left;   CATARACT EXTRACTION Bilateral    CATARACT EXTRACTION W/ INTRAOCULAR LENS  IMPLANT, BILATERAL     COLONOSCOPY  06/24/2011   Procedure: COLONOSCOPY;  Surgeon: [name], MD;  [facility];  Service: Endoscopy;  Laterality: N/A;  8:30 AM   EYE SURGERY     GAS/FLUID EXCHANGE Left 03/31/2019   Procedure: Gas/Fluid Exchange;  Surgeon: Bernarda Caffey, MD;  Location: [place];  Service: Ophthalmology;  Laterality: Left;   INJECTION OF SILICONE OIL  8/75/6433   Procedure: Injection Of Silicone Oil;  Surgeon: Bernarda Caffey, MD;  Location: [place];  Service: Ophthalmology;;   INSERTION OF DIALYSIS CATHETER Right 12/05/2019   Procedure: INSERTION OF DIALYSIS CATHETER RIGHT SUBCLAVIAN;  Surgeon:  Virl Cagey, MD;  [facility];  Service: General;  Laterality: Right;   INSERTION OF DIALYSIS CATHETER Right 12/07/2019   Procedure: Minor Dialysis catheter in place- need to reposition/ adjust catheter to help with flows;  Surgeon: Virl Cagey, MD;  Location[facility];  Service: General;  Laterality: Right;  procedure room case with 1% lidocaine, full sterile drape,  towels, full gown, large chloraprep, 4-0 Monocryl and dermabond    INSERTION OF DIALYSIS CATHETER Right 12/08/2019   Procedure: INSERTION OF DIALYSIS CATHETER EXCHANGE;  Surgeon: Virl Cagey, MD;  [facility];  Service: General;  Laterality: Right;   IR ANGIOGRAM EXTREMITY RIGHT  03/15/2021   IR RADIOLOGIST EVAL & MGMT  02/07/2021   IR RADIOLOGIST EVAL & MGMT  05/09/2021   IR US GUIDE VASC ACCESS RIGHT  03/15/2021   MEMBRANE PEEL Right 02/24/2019   Procedure: Membrane Peel;  Surgeon: Bernarda Caffey, MD;  Location: [place];  Service: Ophthalmology;  Laterality: Right;   PARS PLANA VITRECTOMY Left 03/31/2019   Procedure: PARS PLANA VITRECTOMY WITH 25 GAUGE WITH MEMBRANE PEEL;  Surgeon: Bernarda Caffey, MD;  Location: [place];  Service: Ophthalmology;  Laterality: Left;   PHOTOCOAGULATION WITH LASER Right 02/24/2019   Procedure: Photocoagulation With Laser;  Surgeon: Bernarda Caffey, MD;  Location: [place];  Service: Ophthalmology;  Laterality: Right;   PHOTOCOAGULATION WITH LASER Left 03/31/2019   Procedure: Photocoagulation With Laser;  Surgeon: Bernarda Caffey, MD;  Location: [facility];  Service: Ophthalmology;  Laterality: Left;   RETINAL DETACHMENT SURGERY Left 03/31/2019   TRD Repair - Dr. Bernarda Caffey   SILICON OIL REMOVAL Right 9/48/5462   Procedure: Silicon Oil Removal;  Surgeon: Bernarda Caffey, MD;  Location: [place];  Service: Ophthalmology;  Laterality: Right;   STERNAL WOUND DEBRIDEMENT Left 08/27/2018   Procedure: Incision and DEBRIDEMENT Left Chest, Neck and Mediastinum;  Surgeon: Gaye Pollack, MD;  [facility];   Service: Thoracic;  Laterality: Left;   STERNAL WOUND DEBRIDEMENT Left 08/30/2018   Procedure: WOUND VAC CHANGE, LEFT CHEST AND NECK, POSSIBLE DEBRIDEMENT;  Surgeon: Gaye Pollack, MD;  [facility];  Service: Thoracic;  Laterality: Left;    FAMILY HISTORY Family History  Problem Relation Age of Onset   Hypertension Mother    Colon cancer Neg Hx     SOCIAL HISTORY Social History   Tobacco Use   Smoking status: Never   Smokeless tobacco: Never  Vaping Use   Vaping Use: Never used  Substance Use Topics   Alcohol use: No   Drug use: No       OPHTHALMIC EXAM:  Base Eye Exam     Visual Acuity (Snellen - Linear)       Right Left   Dist [place] 20/200 -1 20/200   Dist ph [place] 20/100 -2 20/150 -1         Tonometry (Tonopen, 2:05 PM)       Right Left   Pressure 16 20         Pupils       Dark Light Shape React APD   Right 4 3 Irregular Minimal None   Left 3 2 Irregular Minimal None         Visual Fields       Left Right   Restrictions Partial outer superior temporal, inferior temporal, inferior nasal deficiencies Partial outer superior temporal, inferior temporal deficiencies         Extraocular Movement       Right Left    Full, Ortho Full, Ortho         Neuro/Psych     Oriented x3: Yes   Mood/Affect: Normal         Dilation     Both eyes: 1.0% Mydriacyl, 2.5% Phenylephrine @ 2:05 PM           Slit Lamp and Fundus Exam     Slit Lamp Exam       Right Left   Lids/Lashes mild Meibomian gland dysfunction mild Meibomian gland dysfunction   Conjunctiva/Sclera subconj silicon oil bubbles, Chemosis, conj Cyst White and quiet   Cornea Trace PEE, Well healed cataract wound, arcus, Debris in tear film Trace PEE, Well healed cataract wound, arcus, Debris in tear film   Anterior Chamber Deep and quiet, no cell or flare Deep and quiet, no cell or flare   Iris slightly irregular dilation, posterior  synchiae from 3:00-1200 round, focal Posterior  synechiae at 1030   Lens PC IOL in good position, pigment deposition, 1+ PCO, mild pigment deposition on optic PC IOL in good position, mild pigment deposition   Anterior Vitreous Post vit, silicon oil bubble ~96% post vitrectomy, good silicone oil fill         Fundus Exam       Right Left   Disc 2+pallor, sharp rim, mild fibrosis along ST rim +pallor, sharp rim, fine NVD--regressing, mild fibrosis   C/D Ratio 0.2 0.2   Macula attached under oil, scattered MA/IRH, nasal and central thickening / edema -- persistent attached under oil, persistent fibrosis, edema -- improved, focal bands of PRF with traction emanating from ST macula -- stable, focal DBH temporal macula--improving   Vessels attenuated, Tortuous Attenuated, dilated venules, Tortuous, early NVE IT arcades   Periphery Attached, dense 360 PRP laser, mild scattered fibrosis - persistent, Focal ret hole along distal IT arcades--good laser surrounding Attached, good 360 PRP laser changes, pre-retinal fibrosis extending to posterior PRP border superior and inferiorly           IMAGING AND PROCEDURES  Imaging and Procedures for $RemoveBefore'@TODAY'[id number]$ @  OCT, Retina - OU - Both Eyes       Right Eye Quality was good. Central Foveal Thickness: 626. Progression has worsened. Findings include preretinal fibrosis, abnormal foveal contour, outer retinal atrophy, epiretinal membrane, intraretinal fluid, no SRF (Mild Interval increase IRF and central thickening).   Left Eye Quality was good. Central Foveal Thickness: 555. Progression has been stable. Findings include preretinal fibrosis, intraretinal fluid, abnormal foveal contour, intraretinal hyper-reflective material, epiretinal membrane, no SRF (Persistent central thickening and IRF--?mild improvement ).   Notes *Images captured and stored on drive  Diagnosis / Impression:  OS: Mild Interval increase IRF and central thickening OS: Persistent central thickening and IRF--?mild improvement    Clinical management:  See below  Abbreviations: NFP - Normal foveal profile. CME - cystoid macular edema. PED - pigment epithelial detachment. IRF - intraretinal fluid. SRF - subretinal fluid. EZ - ellipsoid zone. ERM - epiretinal membrane. ORA - outer retinal atrophy. ORT - outer retinal tubulation. SRHM - subretinal hyper-reflective material      Intravitreal Injection, Pharmacologic Agent - OD - Right Eye       Time Out 07/19/2021. 2:29 PM. Confirmed correct patient, procedure, site, and patient consented.   Anesthesia Topical anesthesia was used. Anesthetic medications included Lidocaine 2%, Proparacaine 0.5%.   Procedure Preparation included eyelid speculum, 5% betadine to ocular surface. A (32g) needle was used.   Injection: 2 mg aflibercept 2 MG/0.05ML   Route: Intravitreal, Site: Right Eye   NDC: A3590391, Lot: [id number], Expiration date: 06/10/2022, Waste: 0.05 mL   Post-op Post injection exam found visual acuity of at least counting fingers. The patient tolerated the procedure well. There were no complications. The patient received written and verbal post procedure care education. Post injection medications were not given.      Intravitreal Injection, Pharmacologic Agent - OS - Left Eye       Time Out 07/19/2021. 2:29 PM. Confirmed correct patient, procedure, site, and patient consented.   Anesthesia Topical anesthesia was used. Anesthetic medications included Lidocaine 2%, Proparacaine 0.5%.   Procedure Preparation included 5% betadine to ocular surface, eyelid speculum. A (32g) needle was used.   Injection: 2 mg aflibercept 2 MG/0.05ML   Route: Intravitreal, Site: Left Eye   NDC: A3590391, Lot: [id number], Expiration date: 04/10/2022, Waste: 0.05 mL   Post-op  Post injection exam found visual acuity of at least counting fingers. The patient tolerated the procedure well. There were no complications. The patient received written and verbal post  procedure care education. Post injection medications were not given.            ASSESSMENT/PLAN:    ICD-10-CM   1. Proliferative diabetic retinopathy of both eyes with macular edema associated with type 2 diabetes mellitus (HCC)  K34.9179 Intravitreal Injection, Pharmacologic Agent - OD - Right Eye    Intravitreal Injection, Pharmacologic Agent - OS - Left Eye    aflibercept (EYLEA) SOLN 2 mg    aflibercept (EYLEA) SOLN 2 mg    2. Retinal detachment, tractional, bilateral  H33.43     3. Vitreous hemorrhage of both eyes ([place])  H43.13     4. Retinal edema  H35.81 OCT, Retina - OU - Both Eyes    5. Essential hypertension  I10     6. Hypertensive retinopathy of both eyes  H35.033     7. Pseudophakia of both eyes  Z96.1      1-4. Proliferative diabetic retinopathy with DME, TRD, and vitreous hemorrhage OU (OD > OS)  - last A1c: 7.9 on 7.6.22  - lost to f/u from Jan 2021 to Oct 2021 due to loss of insurance coverage  - pt with complex medical history with hospitalization in Jan-Feb 2020 for bacteremia and abscess  - history of poor glycemic control for years  - s/p IVA OD #1 (06.20.20), #2 (07.01.20), #3 (09.11.20), #4 (10.09.20), #5 (11.06.20), #6 (11.24.21), #7 (12.22.21), #8 (01.19.22)  - s/p IVA OS #1 (06.20.20), #2 (07.01.20), #3 (08.13.20), #4 (09.11.20)  #5 (11.06.20), #6 (11.24.21), #7 (12.22.21), #8 (01.19.22)  - s/p IVE OU #1 (12.04.20) - sample; #2 (01.06.21), #3 (02.18.22), #4 (03.18.22), #5 (04.18.22), #6 (05.25.22), #7 (07.01.22), #8 (07.29.22), #9 (09.02.22), #10 (09.30.22), #11 (11.04.22)  - s/p STK OS #1 (10.09.20)  - s/p PRP OS (06.10.20)  - s/p PPV/PFC/EL/FAX/silicone oil OD, 15.05.69  - s/p subconj silicon oil removal OD 8.20.20  - s/p PPV/EL/FAX/silicone oil OS, 79.48.01  - BCVA 20/100 OD (improved); 20/150 OS (stable)             - OCT: OS: Mild Interval increase IRF and central thickening; OS: Persistent central thickening and IRF--?mild improvement at 5  weeks  - recommend IVE OU #12 for DME today, 12.09.22 with f/u at 5 weeks  - RBA of procedure discussed, questions answered  - informed consent obtained and signed  - see procedure note             - Eylea benefits investigation begun 01.19.22 -- approved  - discussed possible need for PPV w/ membrane peel and silicon oil exchange OS due to persistent fibrosis  - cont topical PF and Prolensa QID OU for possible CME component  - f/u 5 weeks -- DFE/OCT, possible injection  5,6. Hypertensive retinopathy OU  - discussed importance of tight BP control  - monitor    7. Pseudophakia OU  - s/p CE with IOL (Dr. Gershon Crane)  Ophthalmic Meds Ordered this visit:  Meds ordered this encounter  Medications   aflibercept (EYLEA) SOLN 2 mg   aflibercept (EYLEA) SOLN 2 mg      Return in about 5 weeks (around 08/23/2021) for 5 wk f/u for PDR w/DME OU w/DFE/OCT/likely IVE OU.  There are no Patient Instructions on file for this visit.  This document serves as a record of services personally performed by Aaron Edelman  Antony Haste, MD, PhD. It was created on their behalf by Leonie Douglas, an ophthalmic technician. The creation of this record is the provider's dictation and/or activities during the visit.    Electronically signed by: Leonie Douglas COA, 07/21/21  1:53 AM  This document serves as a record of services personally performed by Gardiner Sleeper, MD, PhD. It was created on their behalf by Estill Bakes, COT an ophthalmic technician. The creation of this record is the provider's dictation and/or activities during the visit.    Electronically signed by: Estill Bakes, COT 12.9.22 @ 1:53 AM   Gardiner Sleeper, M.D., Ph.D. Diseases & Surgery of the Retina and [place] 07/19/2021   I have reviewed the above documentation for accuracy and completeness, and I agree with the above. Gardiner Sleeper, M.D., Ph.D. 07/21/21 1:53 AM  Abbreviations: M myopia (nearsighted); A  astigmatism; H hyperopia (farsighted); P presbyopia; Mrx spectacle prescription;  CTL contact lenses; OD right eye; OS left eye; OU both eyes  XT exotropia; ET esotropia; PEK punctate epithelial keratitis; PEE punctate epithelial erosions; DES dry eye syndrome; MGD meibomian gland dysfunction; ATs artificial tears; PFAT's preservative free artificial tears; [place] nuclear sclerotic cataract; PSC posterior subcapsular cataract; ERM epi-retinal membrane; PVD posterior vitreous detachment; RD retinal detachment; DM diabetes mellitus; DR diabetic retinopathy; NPDR non-proliferative diabetic retinopathy; PDR proliferative diabetic retinopathy; CSME clinically significant macular edema; DME diabetic macular edema; dbh dot blot hemorrhages; CWS cotton wool spot; POAG primary open angle glaucoma; C/D cup-to-disc ratio; HVF humphrey visual field; GVF goldmann visual field; OCT optical coherence tomography; IOP intraocular pressure; BRVO Branch retinal vein occlusion; CRVO central retinal vein occlusion; CRAO central retinal artery occlusion; BRAO branch retinal artery occlusion; RT retinal tear; SB scleral buckle; PPV pars plana vitrectomy; VH Vitreous hemorrhage; PRP panretinal laser photocoagulation; IVK intravitreal kenalog; VMT vitreomacular traction; MH Macular hole;  NVD neovascularization of the disc; NVE neovascularization elsewhere; AREDS age related eye disease study; ARMD age related macular degeneration; POAG primary open angle glaucoma; EBMD epithelial/anterior basement membrane dystrophy; ACIOL anterior chamber intraocular lens; IOL intraocular lens; PCIOL posterior chamber intraocular lens; Phaco/IOL phacoemulsification with intraocular lens placement; [place] photorefractive keratectomy; LASIK laser assisted in situ keratomileusis; HTN hypertension; DM diabetes mellitus; COPD chronic obstructive pulmonary disease

## 2021-07-19 ENCOUNTER — Other Ambulatory Visit: Payer: Self-pay

## 2021-07-19 ENCOUNTER — Encounter (INDEPENDENT_AMBULATORY_CARE_PROVIDER_SITE_OTHER): Payer: Self-pay | Admitting: Ophthalmology

## 2021-07-19 ENCOUNTER — Ambulatory Visit (INDEPENDENT_AMBULATORY_CARE_PROVIDER_SITE_OTHER): Payer: Medicare Other | Admitting: Ophthalmology

## 2021-07-19 DIAGNOSIS — H4313 Vitreous hemorrhage, bilateral: Secondary | ICD-10-CM

## 2021-07-19 DIAGNOSIS — E113513 Type 2 diabetes mellitus with proliferative diabetic retinopathy with macular edema, bilateral: Secondary | ICD-10-CM | POA: Diagnosis not present

## 2021-07-19 DIAGNOSIS — H3581 Retinal edema: Secondary | ICD-10-CM

## 2021-07-19 DIAGNOSIS — Z961 Presence of intraocular lens: Secondary | ICD-10-CM

## 2021-07-19 DIAGNOSIS — H35033 Hypertensive retinopathy, bilateral: Secondary | ICD-10-CM | POA: Diagnosis not present

## 2021-07-19 DIAGNOSIS — H3343 Traction detachment of retina, bilateral: Secondary | ICD-10-CM | POA: Diagnosis not present

## 2021-07-19 DIAGNOSIS — I10 Essential (primary) hypertension: Secondary | ICD-10-CM | POA: Diagnosis not present

## 2021-07-21 ENCOUNTER — Encounter (INDEPENDENT_AMBULATORY_CARE_PROVIDER_SITE_OTHER): Payer: Self-pay | Admitting: Ophthalmology

## 2021-07-21 DIAGNOSIS — E113513 Type 2 diabetes mellitus with proliferative diabetic retinopathy with macular edema, bilateral: Secondary | ICD-10-CM

## 2021-07-21 MED ORDER — AFLIBERCEPT 2MG/0.05ML IZ SOLN FOR KALEIDOSCOPE
2.0000 mg | INTRAVITREAL | Status: AC | PRN
  Administered 2021-07-21: 2 mg via INTRAVITREAL

## 2021-07-26 ENCOUNTER — Ambulatory Visit: Payer: Medicare Other | Admitting: Podiatry

## 2021-07-30 DIAGNOSIS — E8779 Other fluid overload: Secondary | ICD-10-CM | POA: Insufficient documentation

## 2021-08-07 ENCOUNTER — Other Ambulatory Visit: Payer: Self-pay

## 2021-08-07 ENCOUNTER — Ambulatory Visit (INDEPENDENT_AMBULATORY_CARE_PROVIDER_SITE_OTHER): Payer: Medicare Other | Admitting: Podiatry

## 2021-08-07 ENCOUNTER — Ambulatory Visit (INDEPENDENT_AMBULATORY_CARE_PROVIDER_SITE_OTHER): Payer: Medicare Other

## 2021-08-07 DIAGNOSIS — L97512 Non-pressure chronic ulcer of other part of right foot with fat layer exposed: Secondary | ICD-10-CM

## 2021-08-07 DIAGNOSIS — E1142 Type 2 diabetes mellitus with diabetic polyneuropathy: Secondary | ICD-10-CM

## 2021-08-07 DIAGNOSIS — I999 Unspecified disorder of circulatory system: Secondary | ICD-10-CM | POA: Diagnosis not present

## 2021-08-07 DIAGNOSIS — Z01818 Encounter for other preprocedural examination: Secondary | ICD-10-CM

## 2021-08-09 NOTE — Progress Notes (Signed)
Subjective:  Patient ID: Randy Oneal, male    DOB: 01-26-1966,  MRN: [id number]  Chief Complaint  Patient presents with   Wound Check     55 y.o. male presents for wound care.  Patient presents with follow-up to right hallux IPJ ulceration.  Patient states is doing well.  He has been keeping a bandage with Betadine wet-to-dry on the right big toe.  He denies any other acute complaints no nausea fever chills vomiting.  Review of Systems: Negative except as noted in the HPI. Denies N/V/F/Ch.  Past Medical History:  Diagnosis Date   Anemia    Cataract 07/02/2020   Diabetes mellitus    Type 2    Diabetic retinopathy of both eyes ([place]) 01/31/2019   Dr Coralyn Pear 01/2019   ESRD (end stage renal disease) ([place])    [place] [facility]   Herpes simplex virus (HSV) infection    OU   Hypertension    Hypertensive retinopathy    OU   Retinal detachment    OS   Sepsis due to Streptococcus, group B ([place]) 10/05/2018    Current Outpatient Medications:    acetaminophen (TYLENOL) 500 MG tablet, Take 1,000 mg by mouth every 6 (six) hours as needed for moderate pain or headache., Disp: , Rfl:    amLODipine (NORVASC) 2.5 MG tablet, TAKE ONE TABLET BY MOUTH ONCE DAILY. (Patient taking differently: Take 5 mg by mouth daily.), Disp: 30 tablet, Rfl: 0   amLODipine (NORVASC) 5 MG tablet, Take 2.5 mg by mouth in the morning., Disp: , Rfl:    Bromfenac Sodium (PROLENSA) 0.07 % SOLN, Place 1 drop into both eyes 4 (four) times daily. (Patient taking differently: Place 1 drop into both eyes 3 (three) times daily.), Disp: 6 mL, Rfl: 10   cinacalcet (SENSIPAR) 30 MG tablet, Take 30 mg by mouth daily with supper., Disp: , Rfl:    Continuous Blood Gluc Sensor (DEXCOM G6 SENSOR) MISC, 1 Device by Does not apply route as directed., Disp: 3 each, Rfl: 11   Continuous Blood Gluc Transmit (DEXCOM G6 TRANSMITTER) MISC, 1 Device by Does not apply route as directed., Disp: 1 each, Rfl: 3   doxycycline (VIBRA-TABS) 100 MG  tablet, Take 1 tablet (100 mg total) by mouth 2 (two) times daily., Disp: 60 tablet, Rfl: 0   glucose blood (ONETOUCH ULTRA) test strip, 1 each by Other route in the morning, at noon, in the evening, and at bedtime. Use as instructed, Disp: 400 strip, Rfl: 3   insulin aspart (NOVOLOG FLEXPEN) 100 UNIT/ML FlexPen, Inject 7 Units into the skin 3 (three) times daily with meals., Disp: 30 mL, Rfl: 3   Insulin Pen Needle 32G X 4 MM MISC, 1 Device by Does not apply route in the morning, at noon, in the evening, and at bedtime., Disp: 400 each, Rfl: 1   lidocaine-prilocaine (EMLA) cream, Apply 1 application topically Every Tuesday,Thursday,and Saturday with dialysis., Disp: , Rfl:    Methoxy PEG-Epoetin Beta (MIRCERA IJ), Mircera, Disp: , Rfl:    MICROLET LANCETS MISC, USE FOUR TIMES A DAY AS DIRECTED., Disp: 100 each, Rfl: 0   nystatin (MYCOSTATIN/NYSTOP) powder, Apply topically 2 (two) times daily. To groin and scrotum (Patient taking differently: Apply 1 g topically 2 (two) times daily as needed (rash). To groin and scrotum), Disp: 15 g, Rfl: 0   prednisoLONE acetate (PRED FORTE) 1 % ophthalmic suspension, Place 1 drop into both eyes 4 (four) times daily. (Patient taking differently: Place 1 drop into both  eyes 3 (three) times daily.), Disp: 15 mL, Rfl: 3   sucroferric oxyhydroxide (VELPHORO) 500 MG chewable tablet, Chew 1,000 mg by mouth 3 (three) times daily with meals., Disp: , Rfl:    TRESIBA FLEXTOUCH 100 UNIT/ML FlexTouch Pen, Inject 22 Units into the skin at bedtime., Disp: 30 mL, Rfl: 1  Social History   Tobacco Use  Smoking Status Never  Smokeless Tobacco Never    Allergies  Allergen Reactions   Tramadol Nausea And Vomiting   Objective:  There were no vitals filed for this visit. There is no height or weight on file to calculate BMI. Constitutional Well developed. Well nourished.  Vascular Dorsalis pedis pulses faintly palpable bilaterally. Posterior tibial pulses faintly palpable  bilaterally. Capillary refill normal to all digits.  No cyanosis or clubbing noted. Pedal hair growth normal.  Neurologic Normal speech. Oriented to person, place, and time. Protective sensation absent  Dermatologic Wound Location: Right hallux IPJ probing down to bone. No clinical signs of infection including erythema/cellulitis noted.  It is probing down to bone and deeper tissue.  No purulent drainage noted. Wound Base: Mixed Granular/Fibrotic Peri-wound: Macerated Exudate: Scant/small amount Serous exudate Wound Measurements: -See below  Orthopedic: No pain to palpation either foot.   Radiographs: 3 views of skeletally mature the right foot: No acute breakdown of osteomyelitis changes noted to the bone.  Soft tissue ulceration/deficit noted.  No other bony abnormalities identified. Assessment:   1. Skin ulcer of right great toe with fat layer exposed ([place])   2. Vascular abnormality   3. Diabetic polyneuropathy associated with type 2 diabetes mellitus ([place])   4. Encounter for preoperative examination for general surgical procedure        Plan:  Patient was evaluated and treated and all questions answered.  Ulcer right hallux nail probing down to bone -Debridement as below. -Dressed with Betadine wet-to-dry, DSD. -Continue off-loading with surgical shoe. -Patient will go back to surgical shoe.  New surgical shoe was dispensed. -The MRI was reviewed with the patient which shows that there might be acute signs of early osteomyelitis in the distal phalanx as well as the proximal phalanx near the interphalangeal joint.  This seems to clinically correlate with the wound.  -Patient had vascular intervention done by Dr. Earleen Newport.  He had angioplasty of posterior tibial and plantar arteries, AT and peroneal artery. -I will place him on antibiotics doxycycline and to resolve meant of the wound -At this time we will order repeat MRI as the previous imaging was greater than 6 months old.   If the repeat MRI shows worsening of the osteomyelitis patient will benefit from an amputation. -I discussed my surgical plan including preoperative intraoperative postoperative plan in extensive detail.  I discussed with him that he is at high risk of further amputation given his vascular and diabetes.  He has uncontrolled diabetes at 8.5.  I placed him in a high risk of below the knee amputation.  I discussed this with the patient and his family member they all state understanding.  At this time we will proceed with scheduling for right great toe amputation in an outpatient setting it is not clinically infected aside from having osteomyelitic present that is not radiographically evident. -Informed surgical risk consent was reviewed and read aloud to the patient.  I reviewed the films.  I have discussed my findings with the patient in great detail.  I have discussed all risks including but not limited to infection, stiffness, scarring, limp, disability, deformity, damage  to blood vessels and nerves, numbness, poor healing, need for braces, arthritis, chronic pain, amputation, death.  All benefits and realistic expectations discussed in great detail.  I have made no promises as to the outcome.  I have provided realistic expectations.  I have offered the patient a 2nd opinion, which they have declined and assured me they preferred to proceed despite the risks   Procedure: Excisional Debridement of Wound Tool: Sharp chisel blade/tissue nipper Rationale: Removal of non-viable soft tissue from the wound to promote healing.  Anesthesia: none Pre-Debridement Wound Measurements: 0.8 cm x 0.3 cm x 0.5 cm  Post-Debridement Wound Measurements: 0.9 cm x 0.3 cm x 0.5 cm  Type of Debridement: Sharp Excisional Tissue Removed: Non-viable soft tissue Blood loss: Minimal (<50cc) Depth of Debridement: subcutaneous tissue. Technique: Sharp excisional debridement to bleeding, viable wound base.  Wound Progress: The  wound appears to be stagnant at this time. Dressing: Dry, sterile, compression dressing. Disposition: Patient tolerated procedure well. Patient to return in 1 week for follow-up.  No follow-ups on file.

## 2021-08-22 NOTE — Progress Notes (Addendum)
[facility] Note  08/23/2021     CHIEF COMPLAINT Patient presents for Retina Follow Up    HISTORY OF PRESENT ILLNESS: Randy Oneal is a 56 y.o. male who presents to the clinic today for:  HPI     Retina Follow Up   Patient presents with  Diabetic Retinopathy.  In both eyes.  This started 5 weeks ago.  I, the attending physician,  performed the HPI with the patient and updated documentation appropriately.        Comments   Patient here for 5 weeks retina follow up for PDR OU.  Patient states vision doing pretty good. May be better than last visit. No eye pain.      Last edited by Bernarda Caffey, MD on 08/23/2021  3:39 PM.     Pt vision is stable  Referring physician: [name], MD [address] [place],  [place] [address]  HISTORICAL INFORMATION:   Selected notes from the MEDICAL RECORD NUMBER Referred by Dr.Mark Gershon Crane for concern of decreased vision post cataract sx   CURRENT MEDICATIONS: Current Outpatient Medications (Ophthalmic Drugs)  Medication Sig   Bromfenac Sodium (PROLENSA) 0.07 % SOLN Place 1 drop into both eyes 4 (four) times daily. (Patient taking differently: Place 1 drop into both eyes 3 (three) times daily.)   prednisoLONE acetate (PRED FORTE) 1 % ophthalmic suspension Place 1 drop into both eyes 4 (four) times daily. (Patient taking differently: Place 1 drop into both eyes 3 (three) times daily.)   No current facility-administered medications for this visit. (Ophthalmic Drugs)   Current Outpatient Medications (Other)  Medication Sig   acetaminophen (TYLENOL) 500 MG tablet Take 1,000 mg by mouth every 6 (six) hours as needed for moderate pain or headache.   amLODipine (NORVASC) 2.5 MG tablet TAKE ONE TABLET BY MOUTH ONCE DAILY. (Patient taking differently: Take 5 mg by mouth daily.)   amLODipine (NORVASC) 5 MG tablet Take 2.5 mg by mouth in the morning.   cinacalcet (SENSIPAR) 30 MG tablet Take 30 mg by  mouth daily with supper.   Continuous Blood Gluc Sensor (DEXCOM G6 SENSOR) MISC 1 Device by Does not apply route as directed.   Continuous Blood Gluc Transmit (DEXCOM G6 TRANSMITTER) MISC 1 Device by Does not apply route as directed.   doxycycline (VIBRA-TABS) 100 MG tablet Take 1 tablet (100 mg total) by mouth 2 (two) times daily.   glucose blood (ONETOUCH ULTRA) test strip 1 each by Other route in the morning, at noon, in the evening, and at bedtime. Use as instructed   insulin aspart (NOVOLOG FLEXPEN) 100 UNIT/ML FlexPen Inject 7 Units into the skin 3 (three) times daily with meals.   Insulin Pen Needle 32G X 4 MM MISC 1 Device by Does not apply route in the morning, at noon, in the evening, and at bedtime.   lidocaine-prilocaine (EMLA) cream Apply 1 application topically Every Tuesday,Thursday,and Saturday with dialysis.   Methoxy PEG-Epoetin Beta (MIRCERA IJ) Mircera   MICROLET LANCETS MISC USE FOUR TIMES A DAY AS DIRECTED.   nystatin (MYCOSTATIN/NYSTOP) powder Apply topically 2 (two) times daily. To groin and scrotum (Patient taking differently: Apply 1 g topically 2 (two) times daily as needed (rash). To groin and scrotum)   sucroferric oxyhydroxide (VELPHORO) 500 MG chewable tablet Chew 1,000 mg by mouth 3 (three) times daily with meals.   TRESIBA FLEXTOUCH 100 UNIT/ML FlexTouch Pen Inject 22 Units into the skin at bedtime.  No current facility-administered medications for this visit. (Other)   REVIEW OF SYSTEMS: ROS   Positive for: Genitourinary, Endocrine, Eyes Negative for: Constitutional, Gastrointestinal, Neurological, Skin, Musculoskeletal, HENT, Cardiovascular, Respiratory, Psychiatric, Allergic/Imm, Heme/Lymph Last edited by Theodore Demark, COA on 08/23/2021  2:07 PM.     ALLERGIES Allergies  Allergen Reactions   Tramadol Nausea And Vomiting   PAST MEDICAL HISTORY Past Medical History:  Diagnosis Date   Anemia    Cataract 07/02/2020   Diabetes mellitus    Type 2     Diabetic retinopathy of both eyes ([place]) 01/31/2019   Dr Coralyn Pear 01/2019   ESRD (end stage renal disease) ([place])    [place] Frenisus   Herpes simplex virus (HSV) infection    OU   Hypertension    Hypertensive retinopathy    OU   Retinal detachment    OS   Sepsis due to Streptococcus, group B ([place]) 10/05/2018   Past Surgical History:  Procedure Laterality Date   25 GAUGE PARS PLANA VITRECTOMY WITH 20 GAUGE MVR PORT FOR MACULAR HOLE Right 02/24/2019   Procedure: 25 GAUGE PARS PLANA VITRECTOMY WITH 20 GAUGE MVR PORT FOR MACULAR HOLE;  Surgeon: Bernarda Caffey, MD;  Location: [facility];  Service: Ophthalmology;  Laterality: Right;   APPLICATION OF WOUND VAC Left 08/27/2018   Procedure: APPLICATION OF WOUND VAC, left neck;  Surgeon: Gaye Pollack, MD;  Location: [facility];  Service: Thoracic;  Laterality: Left;   APPLICATION OF WOUND VAC Left 08/30/2018   Procedure: APPLICATION OF WOUND VAC;  Surgeon: Gaye Pollack, MD;  Location: [facility];  Service: Thoracic;  Laterality: Left;   AV FISTULA PLACEMENT Left 02/15/2020   Procedure: LEFT ARM ARTERIOVENOUS (AV) FISTULA CREATION;  Surgeon: Waynetta Sandy, MD;  Location: [facility];  Service: Vascular;  Laterality: Left;   CATARACT EXTRACTION Bilateral    CATARACT EXTRACTION W/ INTRAOCULAR LENS  IMPLANT, BILATERAL     COLONOSCOPY  06/24/2011   Procedure: COLONOSCOPY;  Surgeon: [name], MD;  [facility];  Service: Endoscopy;  Laterality: N/A;  8:30 AM   EYE SURGERY     GAS/FLUID EXCHANGE Left 03/31/2019   Procedure: Gas/Fluid Exchange;  Surgeon: Bernarda Caffey, MD;  Location: [facility];  Service: Ophthalmology;  Laterality: Left;   INJECTION OF SILICONE OIL  7/82/4235   Procedure: Injection Of Silicone Oil;  Surgeon: Bernarda Caffey, MD;  Location: [facility];  Service: Ophthalmology;;   INSERTION OF DIALYSIS CATHETER Right 12/05/2019   Procedure: INSERTION OF DIALYSIS CATHETER RIGHT SUBCLAVIAN;  Surgeon: Virl Cagey, MD;  Location[facility];   Service: General;  Laterality: Right;   INSERTION OF DIALYSIS CATHETER Right 12/07/2019   Procedure: Minor Dialysis catheter in place- need to reposition/ adjust catheter to help with flows;  Surgeon: Virl Cagey, MD;  Location: [facility];  Service: General;  Laterality: Right;  procedure room case with 1% lidocaine, full sterile drape,  towels, full gown, large chloraprep, 4-0 Monocryl and dermabond    INSERTION OF DIALYSIS CATHETER Right 12/08/2019   Procedure: INSERTION OF DIALYSIS CATHETER EXCHANGE;  Surgeon: Virl Cagey, MD;  [facility];  Service: General;  Laterality: Right;   IR ANGIOGRAM EXTREMITY RIGHT  03/15/2021   IR RADIOLOGIST EVAL & MGMT  02/07/2021   IR RADIOLOGIST EVAL & MGMT  05/09/2021   IR US GUIDE VASC ACCESS RIGHT  03/15/2021   MEMBRANE PEEL Right 02/24/2019   Procedure: Antoine Primas;  Surgeon: Bernarda Caffey, MD;  Location:  [facility];  Service: Ophthalmology;  Laterality: Right;   PARS PLANA VITRECTOMY Left 03/31/2019   Procedure: PARS PLANA VITRECTOMY WITH 25 GAUGE WITH MEMBRANE PEEL;  Surgeon: Bernarda Caffey, MD;  Location: [facility];  Service: Ophthalmology;  Laterality: Left;   PHOTOCOAGULATION WITH LASER Right 02/24/2019   Procedure: Photocoagulation With Laser;  Surgeon: Bernarda Caffey, MD;  Location: [facility];  Service: Ophthalmology;  Laterality: Right;   PHOTOCOAGULATION WITH LASER Left 03/31/2019   Procedure: Photocoagulation With Laser;  Surgeon: Bernarda Caffey, MD;  Location: [facility];  Service: Ophthalmology;  Laterality: Left;   RETINAL DETACHMENT SURGERY Left 03/31/2019   TRD Repair - Dr. Bernarda Caffey   SILICON OIL REMOVAL Right 12/26/15   Procedure: Silicon Oil Removal;  Surgeon: Bernarda Caffey, MD;  Location: [facility];  Service: Ophthalmology;  Laterality: Right;   STERNAL WOUND DEBRIDEMENT Left 08/27/2018   Procedure: Incision and DEBRIDEMENT Left Chest, Neck and Mediastinum;  Surgeon: Gaye Pollack, MD;  [facility];  Service: Thoracic;  Laterality: Left;    STERNAL WOUND DEBRIDEMENT Left 08/30/2018   Procedure: WOUND VAC CHANGE, LEFT CHEST AND NECK, POSSIBLE DEBRIDEMENT;  Surgeon: Gaye Pollack, MD;  Location[facility];  Service: Thoracic;  Laterality: Left;   FAMILY HISTORY Family History  Problem Relation Age of Onset   Hypertension Mother    Colon cancer Neg Hx    SOCIAL HISTORY Social History   Tobacco Use   Smoking status: Never   Smokeless tobacco: Never  Vaping Use   Vaping Use: Never used  Substance Use Topics   Alcohol use: No   Drug use: No       OPHTHALMIC EXAM:  Base Eye Exam     Visual Acuity (Snellen - Linear)       Right Left   Dist [place] 20/200 20/200 -1   Dist ph [place] 20/150 -2 20/150 -2         Tonometry (Tonopen, 2:03 PM)       Right Left   Pressure 15 18         Pupils       Dark Light Shape React APD   Right 4 3 Irregular Minimal None   Left 3 2 Irregular Minimal None         Visual Fields       Left Right   Restrictions Partial outer superior temporal, inferior temporal, inferior nasal deficiencies Partial outer superior temporal, inferior temporal deficiencies         Extraocular Movement       Right Left    Full, Ortho Full, Ortho         Neuro/Psych     Oriented x3: Yes   Mood/Affect: Normal         Dilation     Both eyes: 1.0% Mydriacyl, 2.5% Phenylephrine @ 2:03 PM           Slit Lamp and Fundus Exam     Slit Lamp Exam       Right Left   Lids/Lashes mild Meibomian gland dysfunction mild Meibomian gland dysfunction   Conjunctiva/Sclera subconj silicon oil bubbles, Chemosis, conj Cyst White and quiet   Cornea Trace PEE, Well healed cataract wound, arcus, Debris in tear film Trace PEE, Well healed cataract wound, arcus, Debris in tear film   Anterior Chamber Deep and quiet, no cell or flare Deep and quiet, no cell or flare   Iris slightly irregular dilation, posterior synchiae from 3:00-1200 round, focal Posterior synechiae at  1030   Lens PC IOL in good  position, pigment deposition, 1+ PCO, mild pigment deposition on optic PC IOL in good position, mild pigment deposition   Anterior Vitreous Post vit, silicon oil bubble ~51% post vitrectomy, good silicone oil fill         Fundus Exam       Right Left   Disc 2+pallor, sharp rim, mild fibrosis along ST rim +pallor, sharp rim, fine NVD--regressing, mild fibrosis   C/D Ratio 0.2 0.2   Macula attached under oil, scattered MA/IRH, nasal and central thickening / edema -- persistent attached under oil, persistent fibrosis, edema -- improved, focal bands of PRF with traction emanating from ST macula -- stable, focal DBH temporal macula--improving   Vessels attenuated, Tortuous Attenuated, dilated venules, Tortuous, early NVE IT arcades   Periphery Attached, dense 360 PRP laser, mild scattered fibrosis - persistent, Focal ret hole along distal IT arcades--good laser surrounding Attached, good 360 PRP laser changes, pre-retinal fibrosis extending to posterior PRP border superior and inferiorly           IMAGING AND PROCEDURES  Imaging and Procedures for _0 @  OCT, Retina - OU - Both Eyes       Right Eye Quality was good. Central Foveal Thickness: 636. Progression has improved. Findings include preretinal fibrosis, abnormal foveal contour, outer retinal atrophy, epiretinal membrane, intraretinal fluid, no SRF (Mild Interval improvement in IRF ).   Left Eye Quality was good. Central Foveal Thickness: 550. Progression has been stable. Findings include preretinal fibrosis, intraretinal fluid, abnormal foveal contour, intraretinal hyper-reflective material, epiretinal membrane, no SRF (Persistent IRF/IRHM/PRF).   Notes *Images captured and stored on drive  Diagnosis / Impression:  OD: Mild Interval improvement in IRF  OS: Persistent IRF/IRHM/PRF  Clinical management:  See below  Abbreviations: NFP - Normal foveal profile. CME - cystoid macular edema. PED - pigment epithelial detachment.  IRF - intraretinal fluid. SRF - subretinal fluid. EZ - ellipsoid zone. ERM - epiretinal membrane. ORA - outer retinal atrophy. ORT - outer retinal tubulation. SRHM - subretinal hyper-reflective material      Intravitreal Injection, Pharmacologic Agent - OD - Right Eye       Time Out 08/23/2021. 2:48 PM. Confirmed correct patient, procedure, site, and patient consented.   Anesthesia Topical anesthesia was used. Anesthetic medications included Lidocaine 2%, Proparacaine 0.5%.   Procedure Preparation included eyelid speculum, 5% betadine to ocular surface. A (32g) needle was used.   Injection: 2 mg aflibercept 2 MG/0.05ML   Route: Intravitreal, Site: Right Eye   NDC: A3590391, Lot: [id number], Expiration date: 06/11/2022, Waste: 0.05 mL   Post-op Post injection exam found visual acuity of at least counting fingers. The patient tolerated the procedure well. There were no complications. The patient received written and verbal post procedure care education. Post injection medications were not given.      Intravitreal Injection, Pharmacologic Agent - OS - Left Eye       Time Out 08/23/2021. 2:49 PM. Confirmed correct patient, procedure, site, and patient consented.   Anesthesia Topical anesthesia was used. Anesthetic medications included Lidocaine 2%, Proparacaine 0.5%.   Procedure Preparation included 5% betadine to ocular surface, eyelid speculum. A (32g) needle was used.   Injection: 2 mg aflibercept 2 MG/0.05ML   Route: Intravitreal, Site: Left Eye   NDC: M7179715, Lot: [id number], Expiration date: 03/10/2022, Waste: 0.05 mL   Post-op Post injection exam found visual acuity of at least counting fingers. The patient tolerated the procedure well. There were no  complications. The patient received written and verbal post procedure care education. Post injection medications were not given.            ASSESSMENT/PLAN:    ICD-10-CM   1. Proliferative diabetic  retinopathy of both eyes with macular edema associated with type 2 diabetes mellitus (HCC)  E11.3513 OCT, Retina - OU - Both Eyes    Intravitreal Injection, Pharmacologic Agent - OD - Right Eye    Intravitreal Injection, Pharmacologic Agent - OS - Left Eye    aflibercept (EYLEA) SOLN 2 mg    aflibercept (EYLEA) SOLN 2 mg    2. Retinal detachment, tractional, bilateral  H33.43     3. Vitreous hemorrhage of both eyes ([place])  H43.13     4. Essential hypertension  I10     5. Hypertensive retinopathy of both eyes  H35.033     6. Pseudophakia of both eyes  Z96.1       1-3. Proliferative diabetic retinopathy with DME, TRD, and vitreous hemorrhage OU (OD > OS)  - last A1c: 7.9 on 7.6.22  - lost to f/u from Jan 2021 to Oct 2021 due to loss of insurance coverage  - pt with complex medical history with hospitalization in Jan-Feb 2020 for bacteremia and abscess  - history of poor glycemic control for years  - s/p IVA OD #1 (06.20.20), #2 (07.01.20), #3 (09.11.20), #4 (10.09.20), #5 (11.06.20), #6 (11.24.21), #7 (12.22.21), #8 (01.19.22)  - s/p IVA OS #1 (06.20.20), #2 (07.01.20), #3 (08.13.20), #4 (09.11.20)  #5 (11.06.20), #6 (11.24.21), #7 (12.22.21), #8 (01.19.22)  - s/p IVE OU #1 (12.04.20) - sample; #2 (01.06.21), #3 (02.18.22), #4 (03.18.22), #5 (04.18.22), #6 (05.25.22), #7 (07.01.22), #8 (07.29.22), #9 (09.02.22), #10 (09.30.22), #11 (11.04.22), #12 (12.09.23)  - s/p STK OS #1 (10.09.20)  - s/p PRP OS (06.10.20)  - s/p PPV/PFC/EL/FAX/silicone oil OD, 82.50.03  - s/p subconj silicon oil removal OD 8.20.20  - s/p PPV/EL/FAX/silicone oil OS, 70.48.88  - BCVA 20/150 OU             - OCT: OD: Mild Interval improvement in IRF; OS: Persistent IRF/IRHM/PRF at 5 wks  - recommend IVE OU #13 for DME today, 01.13.23 with f/u at 6 weeks  - RBA of procedure discussed, questions answered  - informed consent obtained and signed  - see procedure note             - [employer] benefits investigation begun  01.19.22 -- approved  - discussed possible need for PPV w/ membrane peel and silicon oil exchange OS due to persistent fibrosis  - cont topical PF and Prolensa QID OU for possible CME component  - f/u 6 weeks -- DFE/OCT, possible injection  4,5. Hypertensive retinopathy OU  - discussed importance of tight BP control  - monitor   6. Pseudophakia OU  - s/p CE with IOL (Dr. Gershon Crane)  Ophthalmic Meds Ordered this visit:  Meds ordered this encounter  Medications   aflibercept (EYLEA) SOLN 2 mg   aflibercept (EYLEA) SOLN 2 mg     Return in about 6 weeks (around 10/04/2021) for f/u PDR OU, DFE, OCT.  There are no Patient Instructions on file for this visit.  This document serves as a record of services personally performed by Gardiner Sleeper, MD, PhD. It was created on their behalf by Leonie Douglas, an ophthalmic technician. The creation of this record is the provider's dictation and/or activities during the visit.    Electronically signed by: Leonie Douglas COA,  08/23/21  3:43 PM  This document serves as a record of services personally performed by Gardiner Sleeper, MD, PhD. It was created on their behalf by San Jetty. Owens Shark, OA an ophthalmic technician. The creation of this record is the provider's dictation and/or activities during the visit.    Electronically signed by: San Jetty. Marguerita Merles 01.13.2023 3:43 PM  Gardiner Sleeper, M.D., Ph.D. Diseases & Surgery of the Retina and [place] 08/23/2021   I have reviewed the above documentation for accuracy and completeness, and I agree with the above. Gardiner Sleeper, M.D., Ph.D. 08/23/21 3:43 PM   Abbreviations: M myopia (nearsighted); A astigmatism; H hyperopia (farsighted); P presbyopia; Mrx spectacle prescription;  CTL contact lenses; OD right eye; OS left eye; OU both eyes  XT exotropia; ET esotropia; PEK punctate epithelial keratitis; PEE punctate epithelial erosions; DES dry eye syndrome; MGD meibomian  gland dysfunction; ATs artificial tears; PFAT's preservative free artificial tears; [place] nuclear sclerotic cataract; PSC posterior subcapsular cataract; ERM epi-retinal membrane; PVD posterior vitreous detachment; RD retinal detachment; DM diabetes mellitus; DR diabetic retinopathy; NPDR non-proliferative diabetic retinopathy; PDR proliferative diabetic retinopathy; CSME clinically significant macular edema; DME diabetic macular edema; dbh dot blot hemorrhages; CWS cotton wool spot; POAG primary open angle glaucoma; C/D cup-to-disc ratio; HVF humphrey visual field; GVF goldmann visual field; OCT optical coherence tomography; IOP intraocular pressure; BRVO Branch retinal vein occlusion; CRVO central retinal vein occlusion; CRAO central retinal artery occlusion; BRAO branch retinal artery occlusion; RT retinal tear; SB scleral buckle; PPV pars plana vitrectomy; VH Vitreous hemorrhage; PRP panretinal laser photocoagulation; IVK intravitreal kenalog; VMT vitreomacular traction; MH Macular hole;  NVD neovascularization of the disc; NVE neovascularization elsewhere; AREDS age related eye disease study; ARMD age related macular degeneration; POAG primary open angle glaucoma; EBMD epithelial/anterior basement membrane dystrophy; ACIOL anterior chamber intraocular lens; IOL intraocular lens; PCIOL posterior chamber intraocular lens; Phaco/IOL phacoemulsification with intraocular lens placement; [place] photorefractive keratectomy; LASIK laser assisted in situ keratomileusis; HTN hypertension; DM diabetes mellitus; COPD chronic obstructive pulmonary disease

## 2021-08-23 ENCOUNTER — Ambulatory Visit (INDEPENDENT_AMBULATORY_CARE_PROVIDER_SITE_OTHER): Payer: Medicare Other | Admitting: Ophthalmology

## 2021-08-23 ENCOUNTER — Other Ambulatory Visit: Payer: Self-pay

## 2021-08-23 ENCOUNTER — Encounter (INDEPENDENT_AMBULATORY_CARE_PROVIDER_SITE_OTHER): Payer: Self-pay | Admitting: Ophthalmology

## 2021-08-23 DIAGNOSIS — H3581 Retinal edema: Secondary | ICD-10-CM

## 2021-08-23 DIAGNOSIS — H3343 Traction detachment of retina, bilateral: Secondary | ICD-10-CM | POA: Diagnosis not present

## 2021-08-23 DIAGNOSIS — I10 Essential (primary) hypertension: Secondary | ICD-10-CM

## 2021-08-23 DIAGNOSIS — H4313 Vitreous hemorrhage, bilateral: Secondary | ICD-10-CM | POA: Diagnosis not present

## 2021-08-23 DIAGNOSIS — E113513 Type 2 diabetes mellitus with proliferative diabetic retinopathy with macular edema, bilateral: Secondary | ICD-10-CM | POA: Diagnosis not present

## 2021-08-23 DIAGNOSIS — Z961 Presence of intraocular lens: Secondary | ICD-10-CM

## 2021-08-23 DIAGNOSIS — H35033 Hypertensive retinopathy, bilateral: Secondary | ICD-10-CM

## 2021-08-23 MED ORDER — AFLIBERCEPT 2MG/0.05ML IZ SOLN FOR KALEIDOSCOPE
2.0000 mg | INTRAVITREAL | Status: AC | PRN
  Administered 2021-08-23: 2 mg via INTRAVITREAL

## 2021-09-02 ENCOUNTER — Telehealth: Payer: Self-pay

## 2021-09-02 ENCOUNTER — Ambulatory Visit: Payer: Medicare Other | Admitting: Internal Medicine

## 2021-09-02 NOTE — Telephone Encounter (Signed)
Received email from [place] at [facility] stating Randy Oneal wanted to cancel his surgery with Dr. Posey Pronto on 09/09/2021. I called Randy Oneal but unable to leave a message because his voice mailbox has not been set up. Notified Dr. Posey Pronto

## 2021-09-05 ENCOUNTER — Other Ambulatory Visit: Payer: Medicare Other

## 2021-09-06 ENCOUNTER — Other Ambulatory Visit: Payer: Self-pay

## 2021-09-06 ENCOUNTER — Ambulatory Visit
Admission: RE | Admit: 2021-09-06 | Discharge: 2021-09-06 | Disposition: A | Discharge status: Home / Self Care | Payer: Medicare Other | Source: Ambulatory Visit | Attending: Podiatry | Admitting: Podiatry

## 2021-09-06 DIAGNOSIS — L97512 Non-pressure chronic ulcer of other part of right foot with fat layer exposed: Secondary | ICD-10-CM

## 2021-09-06 MED ORDER — GADOBENATE DIMEGLUMINE 529 MG/ML IV SOLN
18.0000 mL | Freq: Once | INTRAVENOUS | Status: AC | PRN
  Administered 2021-09-06: 18 mL via INTRAVENOUS

## 2021-09-17 ENCOUNTER — Encounter: Payer: Medicare Other | Admitting: Podiatry

## 2021-09-30 NOTE — Progress Notes (Shared)
[facility] Note  10/04/2021     CHIEF COMPLAINT Patient presents for No chief complaint on file.    HISTORY OF PRESENT ILLNESS: Randy Oneal is a 56 y.o. male who presents to the clinic today for:    Pt vision is stable  Referring physician: [name], MD [address] [place],  [place] [address]  HISTORICAL INFORMATION:   Selected notes from the MEDICAL RECORD NUMBER Referred by Dr.Mark Gershon Crane for concern of decreased vision post cataract sx   CURRENT MEDICATIONS: Current Outpatient Medications (Ophthalmic Drugs)  Medication Sig   Bromfenac Sodium (PROLENSA) 0.07 % SOLN Place 1 drop into both eyes 4 (four) times daily. (Patient taking differently: Place 1 drop into both eyes 3 (three) times daily.)   prednisoLONE acetate (PRED FORTE) 1 % ophthalmic suspension Place 1 drop into both eyes 4 (four) times daily. (Patient taking differently: Place 1 drop into both eyes 3 (three) times daily.)   No current facility-administered medications for this visit. (Ophthalmic Drugs)   Current Outpatient Medications (Other)  Medication Sig   acetaminophen (TYLENOL) 500 MG tablet Take 1,000 mg by mouth every 6 (six) hours as needed for moderate pain or headache.   amLODipine (NORVASC) 2.5 MG tablet TAKE ONE TABLET BY MOUTH ONCE DAILY. (Patient taking differently: Take 5 mg by mouth daily.)   amLODipine (NORVASC) 5 MG tablet Take 2.5 mg by mouth in the morning.   cinacalcet (SENSIPAR) 30 MG tablet Take 30 mg by mouth daily with supper.   Continuous Blood Gluc Sensor (DEXCOM G6 SENSOR) MISC 1 Device by Does not apply route as directed.   Continuous Blood Gluc Transmit (DEXCOM G6 TRANSMITTER) MISC 1 Device by Does not apply route as directed.   doxycycline (VIBRA-TABS) 100 MG tablet Take 1 tablet (100 mg total) by mouth 2 (two) times daily.   glucose blood (ONETOUCH ULTRA) test strip 1 each by Other route in the morning, at noon, in the evening,  and at bedtime. Use as instructed   insulin aspart (NOVOLOG FLEXPEN) 100 UNIT/ML FlexPen Inject 7 Units into the skin 3 (three) times daily with meals.   Insulin Pen Needle 32G X 4 MM MISC 1 Device by Does not apply route in the morning, at noon, in the evening, and at bedtime.   lidocaine-prilocaine (EMLA) cream Apply 1 application topically Every Tuesday,Thursday,and Saturday with dialysis.   Methoxy PEG-Epoetin Beta (MIRCERA IJ) Mircera   MICROLET LANCETS MISC USE FOUR TIMES A DAY AS DIRECTED.   nystatin (MYCOSTATIN/NYSTOP) powder Apply topically 2 (two) times daily. To groin and scrotum (Patient taking differently: Apply 1 g topically 2 (two) times daily as needed (rash). To groin and scrotum)   sucroferric oxyhydroxide (VELPHORO) 500 MG chewable tablet Chew 1,000 mg by mouth 3 (three) times daily with meals.   TRESIBA FLEXTOUCH 100 UNIT/ML FlexTouch Pen Inject 22 Units into the skin at bedtime.   No current facility-administered medications for this visit. (Other)   REVIEW OF SYSTEMS:   ALLERGIES Allergies  Allergen Reactions   Tramadol Nausea And Vomiting   PAST MEDICAL HISTORY Past Medical History:  Diagnosis Date   Anemia    Cataract 07/02/2020   Diabetes mellitus    Type 2    Diabetic retinopathy of both eyes ([place]) 01/31/2019   Dr Coralyn Pear 01/2019   ESRD (end stage renal disease) ([place])    [place] Frenisus   Herpes simplex virus (HSV) infection    OU  Hypertension    Hypertensive retinopathy    OU   Retinal detachment    OS   Sepsis due to Streptococcus, group B ([place]) 10/05/2018   Past Surgical History:  Procedure Laterality Date   25 GAUGE PARS PLANA VITRECTOMY WITH 20 GAUGE MVR PORT FOR MACULAR HOLE Right 02/24/2019   Procedure: 25 GAUGE PARS PLANA VITRECTOMY WITH 20 GAUGE MVR PORT FOR MACULAR HOLE;  Surgeon: Bernarda Caffey, MD;  Location: [facility];  Service: Ophthalmology;  Laterality: Right;   APPLICATION OF WOUND VAC Left 08/27/2018   Procedure: APPLICATION OF WOUND  VAC, left neck;  Surgeon: Gaye Pollack, MD;  Location: [facility];  Service: Thoracic;  Laterality: Left;   APPLICATION OF WOUND VAC Left 08/30/2018   Procedure: APPLICATION OF WOUND VAC;  Surgeon: Gaye Pollack, MD;  Location: [place];  Service: Thoracic;  Laterality: Left;   AV FISTULA PLACEMENT Left 02/15/2020   Procedure: LEFT ARM ARTERIOVENOUS (AV) FISTULA CREATION;  Surgeon: Waynetta Sandy, MD;  Location: [facility];  Service: Vascular;  Laterality: Left;   CATARACT EXTRACTION Bilateral    CATARACT EXTRACTION W/ INTRAOCULAR LENS  IMPLANT, BILATERAL     COLONOSCOPY  06/24/2011   Procedure: COLONOSCOPY;  Surgeon: [name], MD;  [facility];  Service: Endoscopy;  Laterality: N/A;  8:30 AM   EYE SURGERY     GAS/FLUID EXCHANGE Left 03/31/2019   Procedure: Gas/Fluid Exchange;  Surgeon: Bernarda Caffey, MD;  Location: [facility];  Service: Ophthalmology;  Laterality: Left;   INJECTION OF SILICONE OIL  8/46/9629   Procedure: Injection Of Silicone Oil;  Surgeon: Bernarda Caffey, MD;  Location: [facility];  Service: Ophthalmology;;   INSERTION OF DIALYSIS CATHETER Right 12/05/2019   Procedure: INSERTION OF DIALYSIS CATHETER RIGHT SUBCLAVIAN;  Surgeon: Virl Cagey, MD;  [facility]: [facility];  Service: General;  Laterality: Right;   INSERTION OF DIALYSIS CATHETER Right 12/07/2019   Procedure: Minor Dialysis catheter in place- need to reposition/ adjust catheter to help with flows;  Surgeon: Virl Cagey, MD;  Location: [facility];  Service: General;  Laterality: Right;  procedure room case with 1% lidocaine, full sterile drape,  towels, full gown, large chloraprep, 4-0 Monocryl and dermabond    INSERTION OF DIALYSIS CATHETER Right 12/08/2019   Procedure: INSERTION OF DIALYSIS CATHETER EXCHANGE;  Surgeon: Virl Cagey, MD;  [facility]: [facility];  Service: General;  Laterality: Right;   IR ANGIOGRAM EXTREMITY RIGHT  03/15/2021   IR RADIOLOGIST EVAL & MGMT  02/07/2021   IR RADIOLOGIST EVAL & MGMT   05/09/2021   IR US GUIDE VASC ACCESS RIGHT  03/15/2021   MEMBRANE PEEL Right 02/24/2019   Procedure: Antoine Primas;  Surgeon: Bernarda Caffey, MD;  Location: [facility];  Service: Ophthalmology;  Laterality: Right;   PARS PLANA VITRECTOMY Left 03/31/2019   Procedure: PARS PLANA VITRECTOMY WITH 25 GAUGE WITH MEMBRANE PEEL;  Surgeon: Bernarda Caffey, MD;  Location: [facility];  Service: Ophthalmology;  Laterality: Left;   PHOTOCOAGULATION WITH LASER Right 02/24/2019   Procedure: Photocoagulation With Laser;  Surgeon: Bernarda Caffey, MD;  Location: [facility];  Service: Ophthalmology;  Laterality: Right;   PHOTOCOAGULATION WITH LASER Left 03/31/2019   Procedure: Photocoagulation With Laser;  Surgeon: Bernarda Caffey, MD;  Location: [facility];  Service: Ophthalmology;  Laterality: Left;   RETINAL DETACHMENT SURGERY Left 03/31/2019   TRD Repair - Dr. Bernarda Caffey   SILICON OIL REMOVAL Right 01/06/4131   Procedure: Silicon Oil Removal;  Surgeon: Bernarda Caffey,  MD;  Location: [facility];  Service: Ophthalmology;  Laterality: Right;   STERNAL WOUND DEBRIDEMENT Left 08/27/2018   Procedure: Incision and DEBRIDEMENT Left Chest, Neck and Mediastinum;  Surgeon: Gaye Pollack, MD;  [facility];  Service: Thoracic;  Laterality: Left;   STERNAL WOUND DEBRIDEMENT Left 08/30/2018   Procedure: WOUND VAC CHANGE, LEFT CHEST AND NECK, POSSIBLE DEBRIDEMENT;  Surgeon: Gaye Pollack, MD;  Location[facility];  Service: Thoracic;  Laterality: Left;   FAMILY HISTORY Family History  Problem Relation Age of Onset   Hypertension Mother    Colon cancer Neg Hx    SOCIAL HISTORY Social History   Tobacco Use   Smoking status: Never   Smokeless tobacco: Never  Vaping Use   Vaping Use: Never used  Substance Use Topics   Alcohol use: No   Drug use: No       OPHTHALMIC EXAM:  Not recorded    IMAGING AND PROCEDURES  Imaging and Procedures for @TODAY @          ASSESSMENT/PLAN:    ICD-10-CM   1. Proliferative diabetic retinopathy of both  eyes with macular edema associated with type 2 diabetes mellitus ([place])  T73.2202     2. Retinal detachment, tractional, bilateral  H33.43     3. Vitreous hemorrhage of both eyes ([place])  H43.13     4. Essential hypertension  I10     5. Hypertensive retinopathy of both eyes  H35.033     6. Pseudophakia of both eyes  Z96.1        1-3. Proliferative diabetic retinopathy with DME, TRD, and vitreous hemorrhage OU (OD > OS)  - last A1c: 7.9 on 7.6.22  - lost to f/u from Jan 2021 to Oct 2021 due to loss of insurance coverage  - pt with complex medical history with hospitalization in Jan-Feb 2020 for bacteremia and abscess  - history of poor glycemic control for years  - s/p IVA OD #1 (06.20.20), #2 (07.01.20), #3 (09.11.20), #4 (10.09.20), #5 (11.06.20), #6 (11.24.21), #7 (12.22.21), #8 (01.19.22)  - s/p IVA OS #1 (06.20.20), #2 (07.01.20), #3 (08.13.20), #4 (09.11.20)  #5 (11.06.20), #6 (11.24.21), #7 (12.22.21), #8 (01.19.22)  - s/p IVE OU #1 (12.04.20) - sample; #2 (01.06.21), #3 (02.18.22), #4 (03.18.22), #5 (04.18.22), #6 (05.25.22), #7 (07.01.22), #8 (07.29.22), #9 (09.02.22), #10 (09.30.22), #11 (11.04.22), #12 (12.09.23), #13 (01.13.23)  - s/p STK OS #1 (10.09.20)  - s/p PRP OS (06.10.20)  - s/p PPV/PFC/EL/FAX/silicone oil OD, 54.27.06  - s/p subconj silicon oil removal OD 8.20.20  - s/p PPV/EL/FAX/silicone oil OS, 23.76.28  - BCVA 20/150 OU             - OCT: OD: Mild Interval improvement in IRF; OS: Persistent IRF/IRHM/PRF at 5 wks  - recommend IVE OU #13 for DME today, 02.24.23 with f/u at 6 weeks  - RBA of procedure discussed, questions answered  - informed consent obtained and signed  - see procedure note             - [place] benefits investigation begun 01.19.22 -- approved  - discussed possible need for PPV w/ membrane peel and silicon oil exchange OS due to persistent fibrosis  - cont topical PF and Prolensa QID OU for possible CME component  - f/u 6 weeks -- DFE/OCT,  possible injection  4,5. Hypertensive retinopathy OU  - discussed importance of tight BP control  - monitor   6. Pseudophakia OU  - s/p CE with IOL (Dr.  Gershon Crane)  Ophthalmic Meds Ordered this visit:  No orders of the defined types were placed in this encounter.    No follow-ups on file.  There are no Patient Instructions on file for this visit.  This document serves as a record of services personally performed by Gardiner Sleeper, MD, PhD. It was created on their behalf by Leonie Douglas, an ophthalmic technician. The creation of this record is the provider's dictation and/or activities during the visit.    Electronically signed by: Leonie Douglas COA, 09/30/21  11:59 AM  Gardiner Sleeper, M.D., Ph.D. Diseases & Surgery of the Retina and Vitreous [facility]: M myopia (nearsighted); A astigmatism; H hyperopia (farsighted); P presbyopia; Mrx spectacle prescription;  CTL contact lenses; OD right eye; OS left eye; OU both eyes  XT exotropia; ET esotropia; PEK punctate epithelial keratitis; PEE punctate epithelial erosions; DES dry eye syndrome; MGD meibomian gland dysfunction; ATs artificial tears; PFAT's preservative free artificial tears; [place] nuclear sclerotic cataract; PSC posterior subcapsular cataract; ERM epi-retinal membrane; PVD posterior vitreous detachment; RD retinal detachment; DM diabetes mellitus; DR diabetic retinopathy; NPDR non-proliferative diabetic retinopathy; PDR proliferative diabetic retinopathy; CSME clinically significant macular edema; DME diabetic macular edema; dbh dot blot hemorrhages; CWS cotton wool spot; POAG primary open angle glaucoma; C/D cup-to-disc ratio; HVF humphrey visual field; GVF goldmann visual field; OCT optical coherence tomography; IOP intraocular pressure; BRVO Branch retinal vein occlusion; CRVO central retinal vein occlusion; CRAO central retinal artery occlusion; BRAO branch retinal artery occlusion; RT  retinal tear; SB scleral buckle; PPV pars plana vitrectomy; VH Vitreous hemorrhage; PRP panretinal laser photocoagulation; IVK intravitreal kenalog; VMT vitreomacular traction; MH Macular hole;  NVD neovascularization of the disc; NVE neovascularization elsewhere; AREDS age related eye disease study; ARMD age related macular degeneration; POAG primary open angle glaucoma; EBMD epithelial/anterior basement membrane dystrophy; ACIOL anterior chamber intraocular lens; IOL intraocular lens; PCIOL posterior chamber intraocular lens; Phaco/IOL phacoemulsification with intraocular lens placement; [place] photorefractive keratectomy; LASIK laser assisted in situ keratomileusis; HTN hypertension; DM diabetes mellitus; COPD chronic obstructive pulmonary disease

## 2021-10-02 ENCOUNTER — Encounter: Payer: Medicare Other | Admitting: Podiatry

## 2021-10-04 ENCOUNTER — Encounter (INDEPENDENT_AMBULATORY_CARE_PROVIDER_SITE_OTHER): Payer: Medicare Other | Admitting: Ophthalmology

## 2021-10-04 DIAGNOSIS — H3343 Traction detachment of retina, bilateral: Secondary | ICD-10-CM

## 2021-10-04 DIAGNOSIS — E113513 Type 2 diabetes mellitus with proliferative diabetic retinopathy with macular edema, bilateral: Secondary | ICD-10-CM

## 2021-10-04 DIAGNOSIS — H4313 Vitreous hemorrhage, bilateral: Secondary | ICD-10-CM

## 2021-10-04 DIAGNOSIS — H35033 Hypertensive retinopathy, bilateral: Secondary | ICD-10-CM

## 2021-10-04 DIAGNOSIS — Z961 Presence of intraocular lens: Secondary | ICD-10-CM

## 2021-10-04 DIAGNOSIS — I10 Essential (primary) hypertension: Secondary | ICD-10-CM

## 2021-10-07 NOTE — Progress Notes (Signed)
[facility] Note  10/10/2021     CHIEF COMPLAINT Patient presents for Retina Follow Up    HISTORY OF PRESENT ILLNESS: Randy Oneal is a 56 y.o. male who presents to the clinic today for:  HPI     Retina Follow Up   Patient presents with  Diabetic Retinopathy.  In both eyes.  Severity is moderate.  Duration of 7 weeks.  Since onset it is stable.  I, the attending physician,  performed the HPI with the patient and updated documentation appropriately.        Comments   Pt here for 7 wk ret f/u for PDR OU. Pt states VA is the same, no change. Last A1C in Jan was 7.8, done by dialysis center.       Last edited by Bernarda Caffey, MD on 10/10/2021  4:39 PM.    BP is improving per patient.  Pt vision is stable  Referring physician: [name], MD [address] [place],  [place] [address]  HISTORICAL INFORMATION:   Selected notes from the MEDICAL RECORD NUMBER Referred by Dr.Mark Gershon Crane for concern of decreased vision post cataract sx   CURRENT MEDICATIONS: Current Outpatient Medications (Ophthalmic Drugs)  Medication Sig   Bromfenac Sodium (PROLENSA) 0.07 % SOLN Place 1 drop into both eyes 4 (four) times daily. (Patient taking differently: Place 1 drop into both eyes 3 (three) times daily.)   prednisoLONE acetate (PRED FORTE) 1 % ophthalmic suspension Place 1 drop into both eyes 4 (four) times daily. (Patient taking differently: Place 1 drop into both eyes 3 (three) times daily.)   No current facility-administered medications for this visit. (Ophthalmic Drugs)   Current Outpatient Medications (Other)  Medication Sig   acetaminophen (TYLENOL) 500 MG tablet Take 1,000 mg by mouth every 6 (six) hours as needed for moderate pain or headache.   amLODipine (NORVASC) 2.5 MG tablet TAKE ONE TABLET BY MOUTH ONCE DAILY. (Patient taking differently: Take 5 mg by mouth daily.)   amLODipine (NORVASC) 5 MG tablet Take 2.5 mg by mouth in the  morning.   cinacalcet (SENSIPAR) 30 MG tablet Take 30 mg by mouth daily with supper.   Continuous Blood Gluc Sensor (DEXCOM G6 SENSOR) MISC 1 Device by Does not apply route as directed.   Continuous Blood Gluc Transmit (DEXCOM G6 TRANSMITTER) MISC 1 Device by Does not apply route as directed.   doxycycline (VIBRA-TABS) 100 MG tablet Take 1 tablet (100 mg total) by mouth 2 (two) times daily.   glucose blood (ONETOUCH ULTRA) test strip 1 each by Other route in the morning, at noon, in the evening, and at bedtime. Use as instructed   insulin aspart (NOVOLOG FLEXPEN) 100 UNIT/ML FlexPen Inject 7 Units into the skin 3 (three) times daily with meals.   Insulin Pen Needle 32G X 4 MM MISC 1 Device by Does not apply route in the morning, at noon, in the evening, and at bedtime.   lidocaine-prilocaine (EMLA) cream Apply 1 application topically Every Tuesday,Thursday,and Saturday with dialysis.   Methoxy PEG-Epoetin Beta (MIRCERA IJ) Mircera   MICROLET LANCETS MISC USE FOUR TIMES A DAY AS DIRECTED.   nystatin (MYCOSTATIN/NYSTOP) powder Apply topically 2 (two) times daily. To groin and scrotum (Patient taking differently: Apply 1 g topically 2 (two) times daily as needed (rash). To groin and scrotum)   sucroferric oxyhydroxide (VELPHORO) 500 MG chewable tablet Chew 1,000 mg by mouth 3 (three) times daily with meals.  TRESIBA FLEXTOUCH 100 UNIT/ML FlexTouch Pen Inject 22 Units into the skin at bedtime.   No current facility-administered medications for this visit. (Other)   REVIEW OF SYSTEMS: ROS   Positive for: Genitourinary, Endocrine, Eyes Negative for: Constitutional, Gastrointestinal, Neurological, Skin, Musculoskeletal, HENT, Cardiovascular, Respiratory, Psychiatric, Allergic/Imm, Heme/Lymph Last edited by Kingsley Spittle, COT on 10/10/2021 10:04 AM.     ALLERGIES Allergies  Allergen Reactions   Tramadol Nausea And Vomiting   PAST MEDICAL HISTORY Past Medical History:  Diagnosis Date    Anemia    Cataract 07/02/2020   Diabetes mellitus    Type 2    Diabetic retinopathy of both eyes ([place]) 01/31/2019   Dr Coralyn Pear 01/2019   ESRD (end stage renal disease) ([place])    [place] Frenisus   Herpes simplex virus (HSV) infection    OU   Hypertension    Hypertensive retinopathy    OU   Retinal detachment    OS   Sepsis due to Streptococcus, group B ([place]) 10/05/2018   Past Surgical History:  Procedure Laterality Date   25 GAUGE PARS PLANA VITRECTOMY WITH 20 GAUGE MVR PORT FOR MACULAR HOLE Right 02/24/2019   Procedure: 25 GAUGE PARS PLANA VITRECTOMY WITH 20 GAUGE MVR PORT FOR MACULAR HOLE;  Surgeon: Bernarda Caffey, MD;  Location: [facility];  Service: Ophthalmology;  Laterality: Right;   APPLICATION OF WOUND VAC Left 08/27/2018   Procedure: APPLICATION OF WOUND VAC, left neck;  Surgeon: Gaye Pollack, MD;  Location: [facility];  Service: Thoracic;  Laterality: Left;   APPLICATION OF WOUND VAC Left 08/30/2018   Procedure: APPLICATION OF WOUND VAC;  Surgeon: Gaye Pollack, MD;  Location: [facility];  Service: Thoracic;  Laterality: Left;   AV FISTULA PLACEMENT Left 02/15/2020   Procedure: LEFT ARM ARTERIOVENOUS (AV) FISTULA CREATION;  Surgeon: Waynetta Sandy, MD;  Location: [facility];  Service: Vascular;  Laterality: Left;   CATARACT EXTRACTION Bilateral    CATARACT EXTRACTION W/ INTRAOCULAR LENS  IMPLANT, BILATERAL     COLONOSCOPY  06/24/2011   Procedure: COLONOSCOPY;  Surgeon: [name], MD;  [facility];  Service: Endoscopy;  Laterality: N/A;  8:30 AM   EYE SURGERY     GAS/FLUID EXCHANGE Left 03/31/2019   Procedure: Gas/Fluid Exchange;  Surgeon: Bernarda Caffey, MD;  Location: [facility];  Service: Ophthalmology;  Laterality: Left;   INJECTION OF SILICONE OIL  6/62/9476   Procedure: Injection Of Silicone Oil;  Surgeon: Bernarda Caffey, MD;  Location: [facility];  Service: Ophthalmology;;   INSERTION OF DIALYSIS CATHETER Right 12/05/2019   Procedure: INSERTION OF DIALYSIS CATHETER RIGHT  SUBCLAVIAN;  Surgeon: Virl Cagey, MD;  Location: [facility];  Service: General;  Laterality: Right;   INSERTION OF DIALYSIS CATHETER Right 12/07/2019   Procedure: Minor Dialysis catheter in place- need to reposition/ adjust catheter to help with flows;  Surgeon: Virl Cagey, MD;  Location: [facility];  Service: General;  Laterality: Right;  procedure room case with 1% lidocaine, full sterile drape,  towels, full gown, large chloraprep, 4-0 Monocryl and dermabond    INSERTION OF DIALYSIS CATHETER Right 12/08/2019   Procedure: INSERTION OF DIALYSIS CATHETER EXCHANGE;  Surgeon: Virl Cagey, MD;  Location: [facility];  Service: General;  Laterality: Right;   IR ANGIOGRAM EXTREMITY RIGHT  03/15/2021   IR RADIOLOGIST EVAL & MGMT  02/07/2021   IR RADIOLOGIST EVAL & MGMT  05/09/2021   IR US GUIDE VASC ACCESS RIGHT  03/15/2021   MEMBRANE  PEEL Right 02/24/2019   Procedure: Antoine Primas;  Surgeon: Bernarda Caffey, MD;  Location: [facility];  Service: Ophthalmology;  Laterality: Right;   PARS PLANA VITRECTOMY Left 03/31/2019   Procedure: PARS PLANA VITRECTOMY WITH 25 GAUGE WITH MEMBRANE PEEL;  Surgeon: Bernarda Caffey, MD;  Location: [place];  Service: Ophthalmology;  Laterality: Left;   PHOTOCOAGULATION WITH LASER Right 02/24/2019   Procedure: Photocoagulation With Laser;  Surgeon: Bernarda Caffey, MD;  Location: [place];  Service: Ophthalmology;  Laterality: Right;   PHOTOCOAGULATION WITH LASER Left 03/31/2019   Procedure: Photocoagulation With Laser;  Surgeon: Bernarda Caffey, MD;  Location: [facility];  Service: Ophthalmology;  Laterality: Left;   RETINAL DETACHMENT SURGERY Left 03/31/2019   TRD Repair - Dr. Bernarda Caffey   SILICON OIL REMOVAL Right 7/98/9211   Procedure: Silicon Oil Removal;  Surgeon: Bernarda Caffey, MD;  Location: [facility];  Service: Ophthalmology;  Laterality: Right;   STERNAL WOUND DEBRIDEMENT Left 08/27/2018   Procedure: Incision and DEBRIDEMENT Left Chest, Neck and Mediastinum;  Surgeon: Gaye Pollack, MD;   [facility];  Service: Thoracic;  Laterality: Left;   STERNAL WOUND DEBRIDEMENT Left 08/30/2018   Procedure: WOUND VAC CHANGE, LEFT CHEST AND NECK, POSSIBLE DEBRIDEMENT;  Surgeon: Gaye Pollack, MD;  Location[facility];  Service: Thoracic;  Laterality: Left;   FAMILY HISTORY Family History  Problem Relation Age of Onset   Hypertension Mother    Colon cancer Neg Hx    SOCIAL HISTORY Social History   Tobacco Use   Smoking status: Never   Smokeless tobacco: Never  Vaping Use   Vaping Use: Never used  Substance Use Topics   Alcohol use: No   Drug use: No       OPHTHALMIC EXAM:  Base Eye Exam     Visual Acuity (Snellen - Linear)       Right Left   Dist [place] 20/200 -1 20/200 -2   Dist ph [place] 20/150 -1 20/150 -2         Tonometry (Tonopen, 10:14 AM)       Right Left   Pressure 9 9         Pupils       Dark Light Shape React APD   Right 4 3 Irregular Minimal None   Left 4 3 Irregular Minimal None         Visual Fields       Left Right   Restrictions Partial outer superior temporal, inferior temporal, inferior nasal deficiencies Partial outer superior temporal, inferior temporal deficiencies         Neuro/Psych     Oriented x3: Yes   Mood/Affect: Normal         Dilation     Both eyes: 1.0% Mydriacyl, 2.5% Phenylephrine @ 10:15 AM           Slit Lamp and Fundus Exam     Slit Lamp Exam       Right Left   Lids/Lashes mild Meibomian gland dysfunction mild Meibomian gland dysfunction   Conjunctiva/Sclera subconj silicon oil bubbles, Chemosis, conj Cyst White and quiet   Cornea Trace PEE, Well healed cataract wound, arcus, Debris in tear film Trace PEE, Well healed cataract wound, arcus, Debris in tear film   Anterior Chamber Deep and quiet, no cell or flare Deep and quiet, no cell or flare   Iris slightly irregular dilation, posterior synchiae from 3:00-1200 round, focal Posterior synechiae at 1030   Lens PC IOL in good position,  pigment  deposition, 1+ PCO, mild pigment deposition on optic PC IOL in good position, mild pigment deposition   Anterior Vitreous Post vit, silicon oil bubble ~91% post vitrectomy, good silicone oil fill         Fundus Exam       Right Left   Disc 2+pallor, sharp rim, mild fibrosis along ST rim +pallor, sharp rim, fine NVD--regressed, mild fibrosis   C/D Ratio 0.2 0.2   Macula attached under oil, scattered MA/IRH, nasal and central thickening / edema -- persistent attached under oil, persistent fibrosis, edema, focal bands of PRF with traction emanating from ST macula -- stable, focal DBH temporal macula--improving   Vessels attenuated, Tortuous Attenuated, dilated venules, Tortuous   Periphery Attached, dense 360 PRP laser, mild scattered fibrosis - persistent, Focal ret hole along distal IT arcades--good laser surrounding Attached, good 360 PRP laser changes, pre-retinal fibrosis extending to posterior PRP border superior and inferiorly           IMAGING AND PROCEDURES  Imaging and Procedures for $RemoveBefore'@TODAY'[id number]$ @  OCT, Retina - OU - Both Eyes       Right Eye Quality was good. Central Foveal Thickness: 729. Progression has been stable. Findings include preretinal fibrosis, abnormal foveal contour, outer retinal atrophy, epiretinal membrane, intraretinal fluid, no SRF (Persistent in IRF/edema ).   Left Eye Quality was good. Central Foveal Thickness: 618. Progression has worsened. Findings include preretinal fibrosis, intraretinal fluid, abnormal foveal contour, intraretinal hyper-reflective material, epiretinal membrane, no SRF (Persistent IRF/IRHM/PRF -- mild interval increase in central edema).   Notes *Images captured and stored on drive  Diagnosis / Impression:  OD: Persistent in IRF/edema   OS: Persistent IRF/IRHM/PRF -- mild interval increase in central edema  Clinical management:  See below  Abbreviations: NFP - Normal foveal profile. CME - cystoid macular edema. PED - pigment  epithelial detachment. IRF - intraretinal fluid. SRF - subretinal fluid. EZ - ellipsoid zone. ERM - epiretinal membrane. ORA - outer retinal atrophy. ORT - outer retinal tubulation. SRHM - subretinal hyper-reflective material      Intravitreal Injection, Pharmacologic Agent - OD - Right Eye       Time Out 10/10/2021. 11:17 AM. Confirmed correct patient, procedure, site, and patient consented.   Anesthesia Topical anesthesia was used. Anesthetic medications included Lidocaine 2%, Proparacaine 0.5%.   Procedure Preparation included eyelid speculum, 5% betadine to ocular surface. A (32g) needle was used.   Injection: 2 mg aflibercept 2 MG/0.05ML   Route: Intravitreal, Site: Right Eye   NDC: A3590391, Lot: [id number], Expiration date: 07/11/2022, Waste: 0.05 mL   Post-op Post injection exam found visual acuity of at least counting fingers. The patient tolerated the procedure well. There were no complications. The patient received written and verbal post procedure care education. Post injection medications were not given.      Intravitreal Injection, Pharmacologic Agent - OS - Left Eye       Time Out 10/10/2021. 11:17 AM. Confirmed correct patient, procedure, site, and patient consented.   Anesthesia Topical anesthesia was used. Anesthetic medications included Lidocaine 2%, Proparacaine 0.5%.   Procedure Preparation included 5% betadine to ocular surface, eyelid speculum. A (32g) needle was used.   Injection: 2 mg aflibercept 2 MG/0.05ML   Route: Intravitreal, Site: Left Eye   NDC: M7179715, Lot: [id number], Expiration date: 02/08/2022, Waste: 0.05 mL   Post-op Post injection exam found visual acuity of at least counting fingers. The patient tolerated the procedure well. There were no complications. The patient received  written and verbal post procedure care education. Post injection medications were not given.            ASSESSMENT/PLAN:    ICD-10-CM   1.  Proliferative diabetic retinopathy of both eyes with macular edema associated with type 2 diabetes mellitus (HCC)  E11.3513 OCT, Retina - OU - Both Eyes    Intravitreal Injection, Pharmacologic Agent - OD - Right Eye    Intravitreal Injection, Pharmacologic Agent - OS - Left Eye    aflibercept (EYLEA) SOLN 2 mg    aflibercept (EYLEA) SOLN 2 mg    2. Retinal detachment, tractional, bilateral  H33.43     3. Vitreous hemorrhage of both eyes ([place])  H43.13     4. Essential hypertension  I10     5. Hypertensive retinopathy of both eyes  H35.033     6. Pseudophakia of both eyes  Z96.1      1-3. Proliferative diabetic retinopathy with DME, TRD, and vitreous hemorrhage OU (OD > OS)  - last A1c: 7.9 on 7.6.22  - lost to f/u from Jan 2021 to Oct 2021 due to loss of insurance coverage  - pt with complex medical history with hospitalization in Jan-Feb 2020 for bacteremia and abscess  - history of poor glycemic control for years  - s/p IVA OD #1 (06.20.20), #2 (07.01.20), #3 (09.11.20), #4 (10.09.20), #5 (11.06.20), #6 (11.24.21), #7 (12.22.21), #8 (01.19.22)  - s/p IVA OS #1 (06.20.20), #2 (07.01.20), #3 (08.13.20), #4 (09.11.20)  #5 (11.06.20), #6 (11.24.21), #7 (12.22.21), #8 (01.19.22)  - s/p IVE OU #1 (12.04.20) - sample; #2 (01.06.21), #3 (02.18.22), #4 (03.18.22), #5 (04.18.22), #6 (05.25.22), #7 (07.01.22), #8 (07.29.22), #9 (09.02.22), #10 (09.30.22), #11 (11.04.22), #12 (12.09.23), #13 (01.13.23)  - s/p STK OS #1 (10.09.20)  - s/p PRP OS (06.10.20)  - s/p PPV/PFC/EL/FAX/silicone oil OD, 43.15.40  - s/p subconj silicon oil removal OD 8.20.20  - s/p PPV/EL/FAX/silicone oil OS, 08.67.61  - BCVA 20/150 OU             - OCT: OD: Persistent in IRF/edema; OS: Persistent IRF/IRHM/PRF -- mild interval increase in central edema at 6 wks  - recommend IVE OU #14 for DME today, 03.02.23 with f/u at 6 weeks  - RBA of procedure discussed, questions answered  - informed consent obtained and signed  -  see procedure note             - Eylea benefits investigation begun 01.19.22 -- approved  - discussed possible need for PPV w/ membrane peel and silicon oil exchange OS due to persistent fibrosis  - cont topical PF and Prolensa QID OU for possible CME component  - f/u 6 weeks -- DFE/OCT, possible injection  4,5. Hypertensive retinopathy OU  - discussed importance of tight BP control  - monitor   6. Pseudophakia OU  - s/p CE with IOL (Dr. Gershon Crane)  Ophthalmic Meds Ordered this visit:  Meds ordered this encounter  Medications   aflibercept (EYLEA) SOLN 2 mg   aflibercept (EYLEA) SOLN 2 mg     Return in about 6 weeks (around 11/21/2021) for DFE, OCT, possible injection.  There are no Patient Instructions on file for this visit.  This document serves as a record of services personally performed by Gardiner Sleeper, MD, PhD. It was created on their behalf by San Jetty. Owens Shark, OA an ophthalmic technician. The creation of this record is the provider's dictation and/or activities during the visit.    Electronically signed by:  San Jetty. Owens Shark, [place] 02.27.2023 4:41 PM   Gardiner Sleeper, M.D., Ph.D. Diseases & Surgery of the Retina and Vitreous [facility]  I have reviewed the above documentation for accuracy and completeness, and I agree with the above. Gardiner Sleeper, M.D., Ph.D. 10/10/21 4:41 PM   Abbreviations: M myopia (nearsighted); A astigmatism; H hyperopia (farsighted); P presbyopia; Mrx spectacle prescription;  CTL contact lenses; OD right eye; OS left eye; OU both eyes  XT exotropia; ET esotropia; PEK punctate epithelial keratitis; PEE punctate epithelial erosions; DES dry eye syndrome; MGD meibomian gland dysfunction; ATs artificial tears; PFAT's preservative free artificial tears; [place] nuclear sclerotic cataract; PSC posterior subcapsular cataract; ERM epi-retinal membrane; PVD posterior vitreous detachment; RD retinal detachment; DM diabetes mellitus; DR  diabetic retinopathy; NPDR non-proliferative diabetic retinopathy; PDR proliferative diabetic retinopathy; CSME clinically significant macular edema; DME diabetic macular edema; dbh dot blot hemorrhages; CWS cotton wool spot; POAG primary open angle glaucoma; C/D cup-to-disc ratio; HVF humphrey visual field; GVF goldmann visual field; OCT optical coherence tomography; IOP intraocular pressure; BRVO Branch retinal vein occlusion; CRVO central retinal vein occlusion; CRAO central retinal artery occlusion; BRAO branch retinal artery occlusion; RT retinal tear; SB scleral buckle; PPV pars plana vitrectomy; VH Vitreous hemorrhage; PRP panretinal laser photocoagulation; IVK intravitreal kenalog; VMT vitreomacular traction; MH Macular hole;  NVD neovascularization of the disc; NVE neovascularization elsewhere; AREDS age related eye disease study; ARMD age related macular degeneration; POAG primary open angle glaucoma; EBMD epithelial/anterior basement membrane dystrophy; ACIOL anterior chamber intraocular lens; IOL intraocular lens; PCIOL posterior chamber intraocular lens; Phaco/IOL phacoemulsification with intraocular lens placement; [place] photorefractive keratectomy; LASIK laser assisted in situ keratomileusis; HTN hypertension; DM diabetes mellitus; COPD chronic obstructive pulmonary disease

## 2021-10-08 ENCOUNTER — Encounter (INDEPENDENT_AMBULATORY_CARE_PROVIDER_SITE_OTHER): Payer: Medicare Other | Admitting: Ophthalmology

## 2021-10-10 ENCOUNTER — Ambulatory Visit (INDEPENDENT_AMBULATORY_CARE_PROVIDER_SITE_OTHER): Payer: Medicare Other | Admitting: Ophthalmology

## 2021-10-10 ENCOUNTER — Other Ambulatory Visit: Payer: Self-pay

## 2021-10-10 ENCOUNTER — Encounter (INDEPENDENT_AMBULATORY_CARE_PROVIDER_SITE_OTHER): Payer: Self-pay | Admitting: Ophthalmology

## 2021-10-10 DIAGNOSIS — H3343 Traction detachment of retina, bilateral: Secondary | ICD-10-CM

## 2021-10-10 DIAGNOSIS — H35033 Hypertensive retinopathy, bilateral: Secondary | ICD-10-CM | POA: Diagnosis not present

## 2021-10-10 DIAGNOSIS — I10 Essential (primary) hypertension: Secondary | ICD-10-CM

## 2021-10-10 DIAGNOSIS — H4313 Vitreous hemorrhage, bilateral: Secondary | ICD-10-CM

## 2021-10-10 DIAGNOSIS — Z961 Presence of intraocular lens: Secondary | ICD-10-CM

## 2021-10-10 DIAGNOSIS — E113513 Type 2 diabetes mellitus with proliferative diabetic retinopathy with macular edema, bilateral: Secondary | ICD-10-CM | POA: Diagnosis not present

## 2021-10-10 MED ORDER — AFLIBERCEPT 2MG/0.05ML IZ SOLN FOR KALEIDOSCOPE
2.0000 mg | INTRAVITREAL | Status: AC | PRN
  Administered 2021-10-10: 2 mg via INTRAVITREAL

## 2021-10-23 DIAGNOSIS — U071 COVID-19: Secondary | ICD-10-CM | POA: Insufficient documentation

## 2021-10-31 ENCOUNTER — Telehealth: Payer: Self-pay | Admitting: Podiatry

## 2021-10-31 NOTE — Telephone Encounter (Signed)
Pt is calling stating he has received a few calls regarding his MRI results. He states that he is not sure who has left voice messages on his phone. ? ?Please advise ?

## 2021-10-31 NOTE — Telephone Encounter (Signed)
Dr. Posey Pronto, Pt need to come back in for MRI results but your schedule is full for the next 3 weeks. How long would you like for him to wait to get his results? ? ?Please advise. ?

## 2021-11-01 NOTE — Telephone Encounter (Signed)
Would you be okay in giving the results over the phone/virtual visit per [facility]? ? ?Please advise. ?

## 2021-11-01 NOTE — Telephone Encounter (Signed)
Dr. Posey Pronto, Pt need to come back in for MRI results but your schedule is full for the next 3 weeks. How long would you like for him to wait to get his results? ?  ?Please advise ?

## 2021-11-15 NOTE — Progress Notes (Addendum)
?[facility] Note ? ?11/19/2021 ? ?  ? ?CHIEF COMPLAINT ?Patient presents for Retina Follow Up ? ? ? ?HISTORY OF PRESENT ILLNESS: ?Randy Oneal is a 56 y.o. male who presents to the clinic today for:  ?HPI   ? ? Retina Follow Up   ?Patient presents with  Diabetic Retinopathy.  In both eyes.  This started years ago.  Severity is moderate.  Duration of 7 weeks.  Since onset it is stable.  I, the attending physician,  performed the HPI with the patient and updated documentation appropriately. ? ?  ?  ? ? Comments   ?Patient feels that the vision has remained stable since his last visit about 6 weeks ago. He is here for a possible Eylea injection OU. His blood sugar was 155 at last check yesterday. His last A1C was 7.8 about 1 month ago.  ? ?  ?  ?Last edited by Bernarda Caffey, MD on 11/19/2021  3:44 PM.  ?  ?Pt vision is stable. Still on Dialysis. No new problems with kidney's.  ? ?Referring physician: ?[name], MD ?[address] ?[address] ?[place],  [place] [address] ? ?HISTORICAL INFORMATION:  ? ?Selected notes from the [facility] ?Referred by Dr.Mark Gershon Crane for concern of decreased vision post cataract sx  ? ?CURRENT MEDICATIONS: ?Current Outpatient Medications (Ophthalmic Drugs)  ?Medication Sig  ? Bromfenac Sodium (PROLENSA) 0.07 % SOLN Place 1 drop into both eyes 4 (four) times daily. (Patient taking differently: Place 1 drop into both eyes 3 (three) times daily.)  ? prednisoLONE acetate (PRED FORTE) 1 % ophthalmic suspension Place 1 drop into both eyes 4 (four) times daily. (Patient taking differently: Place 1 drop into both eyes 3 (three) times daily.)  ? ?No current facility-administered medications for this visit. (Ophthalmic Drugs)  ? ?Current Outpatient Medications (Other)  ?Medication Sig  ? acetaminophen (TYLENOL) 500 MG tablet Take 1,000 mg by mouth every 6 (six) hours as needed for moderate pain or headache.  ? amLODipine (NORVASC) 2.5 MG tablet TAKE ONE  TABLET BY MOUTH ONCE DAILY. (Patient taking differently: Take 5 mg by mouth daily.)  ? amLODipine (NORVASC) 5 MG tablet Take 2.5 mg by mouth in the morning.  ? cinacalcet (SENSIPAR) 30 MG tablet Take 30 mg by mouth daily with supper.  ? Continuous Blood Gluc Sensor (DEXCOM G6 SENSOR) MISC 1 Device by Does not apply route as directed.  ? Continuous Blood Gluc Transmit (DEXCOM G6 TRANSMITTER) MISC 1 Device by Does not apply route as directed.  ? doxycycline (VIBRA-TABS) 100 MG tablet Take 1 tablet (100 mg total) by mouth 2 (two) times daily.  ? glucose blood (ONETOUCH ULTRA) test strip 1 each by Other route in the morning, at noon, in the evening, and at bedtime. Use as instructed  ? insulin aspart (NOVOLOG FLEXPEN) 100 UNIT/ML FlexPen Inject 7 Units into the skin 3 (three) times daily with meals.  ? Insulin Pen Needle 32G X 4 MM MISC 1 Device by Does not apply route in the morning, at noon, in the evening, and at bedtime.  ? lidocaine-prilocaine (EMLA) cream Apply 1 application topically Every Tuesday,Thursday,and Saturday with dialysis.  ? Methoxy PEG-Epoetin Beta (MIRCERA IJ) Mircera  ? MICROLET LANCETS MISC USE FOUR TIMES A DAY AS DIRECTED.  ? nystatin (MYCOSTATIN/NYSTOP) powder Apply topically 2 (two) times daily. To groin and scrotum (Patient taking differently: Apply 1 g topically 2 (two) times daily as needed (rash). To groin and  scrotum)  ? sucroferric oxyhydroxide (VELPHORO) 500 MG chewable tablet Chew 1,000 mg by mouth 3 (three) times daily with meals.  ? TRESIBA FLEXTOUCH 100 UNIT/ML FlexTouch Pen Inject 22 Units into the skin at bedtime.  ? ?No current facility-administered medications for this visit. (Other)  ? ?REVIEW OF SYSTEMS: ?ROS   ?Positive for: Genitourinary, Endocrine, Eyes ?Negative for: Constitutional, Gastrointestinal, Neurological, Skin, Musculoskeletal, HENT, Cardiovascular, Respiratory, Psychiatric, Allergic/Imm, Heme/Lymph ?Last edited by Annie Paras, COT on 11/19/2021  2:01 PM.   ?  ? ?ALLERGIES ?Allergies  ?Allergen Reactions  ? Tramadol Nausea And Vomiting  ? ?PAST MEDICAL HISTORY ?Past Medical History:  ?Diagnosis Date  ? Anemia   ? Cataract 07/02/2020  ? Diabetes mellitus   ? Type 2   ? Diabetic retinopathy of both eyes ([place]) 01/31/2019  ? Dr Coralyn Pear 01/2019  ? ESRD (end stage renal disease) ([place])   ? [place] Frenisus  ? Herpes simplex virus (HSV) infection   ? OU  ? Hypertension   ? Hypertensive retinopathy   ? OU  ? Retinal detachment   ? OS  ? Sepsis due to Streptococcus, group B ([place]) 10/05/2018  ? ?Past Surgical History:  ?Procedure Laterality Date  ? 25 GAUGE PARS PLANA VITRECTOMY WITH 20 GAUGE MVR PORT FOR MACULAR HOLE Right 02/24/2019  ? Procedure: 6 GAUGE PARS PLANA VITRECTOMY WITH 20 GAUGE MVR PORT FOR MACULAR HOLE;  Surgeon: Bernarda Caffey, MD;  Location: [facility];  Service: Ophthalmology;  Laterality: Right;  ? APPLICATION OF WOUND VAC Left 08/27/2018  ? Procedure: APPLICATION OF WOUND VAC, left neck;  Surgeon: Gaye Pollack, MD;  Location: [facility];  Service: Thoracic;  Laterality: Left;  ? APPLICATION OF WOUND VAC Left 08/30/2018  ? Procedure: APPLICATION OF WOUND VAC;  Surgeon: Gaye Pollack, MD;  Location: [facility];  Service: Thoracic;  Laterality: Left;  ? AV FISTULA PLACEMENT Left 02/15/2020  ? Procedure: LEFT ARM ARTERIOVENOUS (AV) FISTULA CREATION;  Surgeon: Waynetta Sandy, MD;  Location: [facility];  Service: Vascular;  Laterality: Left;  ? CATARACT EXTRACTION Bilateral   ? CATARACT EXTRACTION W/ INTRAOCULAR LENS  IMPLANT, BILATERAL    ? COLONOSCOPY  06/24/2011  ? Procedure: COLONOSCOPY;  Surgeon: [name], MD;  [facility];  Service: Endoscopy;  Laterality: N/A;  8:30 AM  ? EYE SURGERY    ? GAS/FLUID EXCHANGE Left 03/31/2019  ? Procedure: Gas/Fluid Exchange;  Surgeon: Bernarda Caffey, MD;  Location: [facility];  Service: Ophthalmology;  Laterality: Left;  ? INJECTION OF SILICONE OIL  02/03/6388  ? Procedure: Injection Of Silicone Oil;  Surgeon: Bernarda Caffey,  MD;  Location: [facility];  Service: Ophthalmology;;  ? INSERTION OF DIALYSIS CATHETER Right 12/05/2019  ? Procedure: INSERTION OF DIALYSIS CATHETER RIGHT SUBCLAVIAN;  Surgeon: Virl Cagey, MD;  Location: [facility];  Service: General;  Laterality: Right;  ? INSERTION OF DIALYSIS CATHETER Right 12/07/2019  ? Procedure: Minor Dialysis catheter in place- need to reposition/ adjust catheter to help with flows;  Surgeon: Virl Cagey, MD;  Location: [facility];  Service: General;  Laterality: Right;  procedure room case with 1% lidocaine, full sterile drape,  towels, full gown, large chloraprep, 4-0 Monocryl and dermabond   ? INSERTION OF DIALYSIS CATHETER Right 12/08/2019  ? Procedure: INSERTION OF DIALYSIS CATHETER EXCHANGE;  Surgeon: Virl Cagey, MD;  [facility];  Service: General;  Laterality: Right;  ? IR ANGIOGRAM EXTREMITY RIGHT  03/15/2021  ? IR RADIOLOGIST EVAL & MGMT  02/07/2021  ? IR RADIOLOGIST EVAL & MGMT  05/09/2021  ? IR US GUIDE VASC ACCESS RIGHT  03/15/2021  ? MEMBRANE PEEL Right 02/24/2019  ? Procedure: Antoine Primas;  Surgeon: Bernarda Caffey, MD;  Location: [facility];  Service: Ophthalmology;  Laterality: Right;  ? PARS PLANA VITRECTOMY Left 03/31/2019  ? Procedure: PARS PLANA VITRECTOMY WITH 25 GAUGE WITH MEMBRANE PEEL;  Surgeon: Bernarda Caffey, MD;  Location: [facility];  Service: Ophthalmology;  Laterality: Left;  ? PHOTOCOAGULATION WITH LASER Right 02/24/2019  ? Procedure: Photocoagulation With Laser;  Surgeon: Bernarda Caffey, MD;  Location: [facility];  Service: Ophthalmology;  Laterality: Right;  ? PHOTOCOAGULATION WITH LASER Left 03/31/2019  ? Procedure: Photocoagulation With Laser;  Surgeon: Bernarda Caffey, MD;  Location: [facility];  Service: Ophthalmology;  Laterality: Left;  ? RETINAL DETACHMENT SURGERY Left 03/31/2019  ? TR[facility] Repair - Dr. Bernarda Caffey  ? SILICON OIL REMOVAL Right 03/31/2019  ? Procedure: Silicon Oil Removal;  Surgeon: Bernarda Caffey, MD;  Location: [facility];  Service: Ophthalmology;  Laterality:  Right;  ? STERNAL WOUND DEBRIDEMENT Left 08/27/2018  ? Procedure: Incision and DEBRIDEMENT Left Chest, Neck and Mediastinum;  Surgeon: Gaye Pollack, MD;  [facility];  Service: Thoracic;  Laterality: [place]

## 2021-11-18 ENCOUNTER — Telehealth: Payer: Self-pay | Admitting: Podiatry

## 2021-11-18 NOTE — Telephone Encounter (Signed)
Patient called back to speak with Dr. Posey Pronto on 11/14/2021. He would like a call back. ? ?Please advise  ? ?

## 2021-11-19 ENCOUNTER — Encounter (INDEPENDENT_AMBULATORY_CARE_PROVIDER_SITE_OTHER): Payer: Self-pay | Admitting: Ophthalmology

## 2021-11-19 ENCOUNTER — Ambulatory Visit (INDEPENDENT_AMBULATORY_CARE_PROVIDER_SITE_OTHER): Payer: Medicare Other | Admitting: Ophthalmology

## 2021-11-19 DIAGNOSIS — H35033 Hypertensive retinopathy, bilateral: Secondary | ICD-10-CM

## 2021-11-19 DIAGNOSIS — H4313 Vitreous hemorrhage, bilateral: Secondary | ICD-10-CM

## 2021-11-19 DIAGNOSIS — Z961 Presence of intraocular lens: Secondary | ICD-10-CM

## 2021-11-19 DIAGNOSIS — H3343 Traction detachment of retina, bilateral: Secondary | ICD-10-CM | POA: Diagnosis not present

## 2021-11-19 DIAGNOSIS — I10 Essential (primary) hypertension: Secondary | ICD-10-CM

## 2021-11-19 DIAGNOSIS — E113513 Type 2 diabetes mellitus with proliferative diabetic retinopathy with macular edema, bilateral: Secondary | ICD-10-CM

## 2021-11-19 MED ORDER — AFLIBERCEPT 2MG/0.05ML IZ SOLN FOR KALEIDOSCOPE
2.0000 mg | INTRAVITREAL | Status: AC | PRN
  Administered 2021-11-19: 2 mg via INTRAVITREAL

## 2021-11-27 IMAGING — MR MR FOOT*R* W/O CM
9 of 10 series · 34 of 40 positions shown · non-contrast
Comparison: MRI 08/18/2018

CLINICAL DATA: Nonhealing diabetic foot ulcer of the right great
toe

EXAM:
MRI OF THE RIGHT FOREFOOT WITHOUT CONTRAST
TECHNIQUE: Multiplanar, multisequence MR imaging of the right forefoot was
performed. No intravenous contrast was administered.

[Series 5: T2 fat-sat · coronal · 3.0mm · 0.31mm/px · 6 of 45 slices shown (1 of 4)]
[im 1/45]
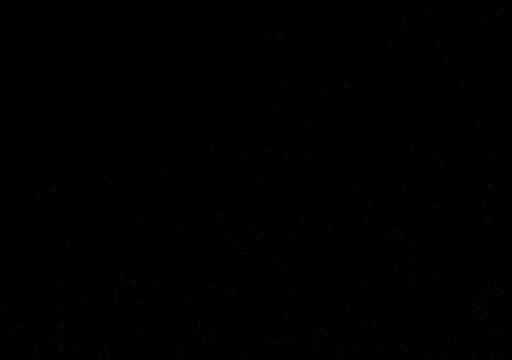
[im 9/45]
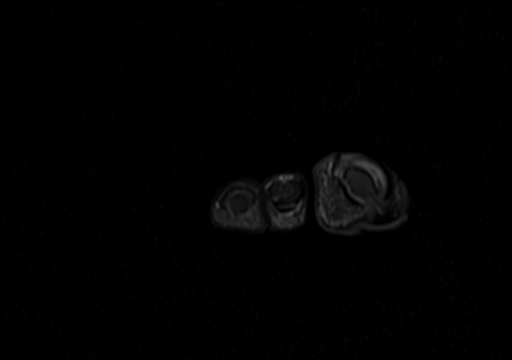
[im 18/45]
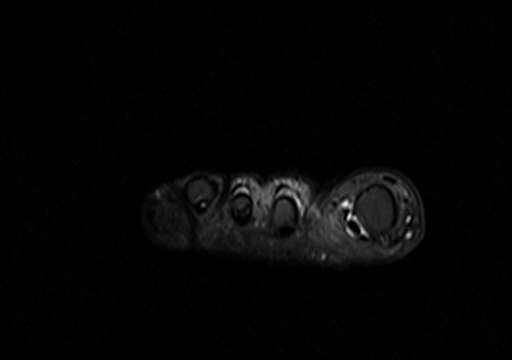
[im 27/45]
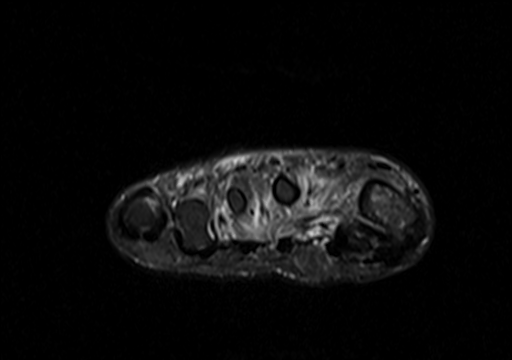
[im 36/45]
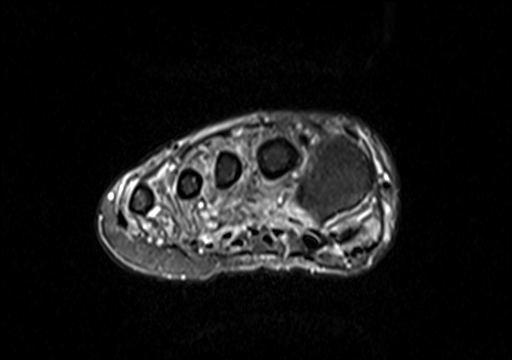
[im 45/45]
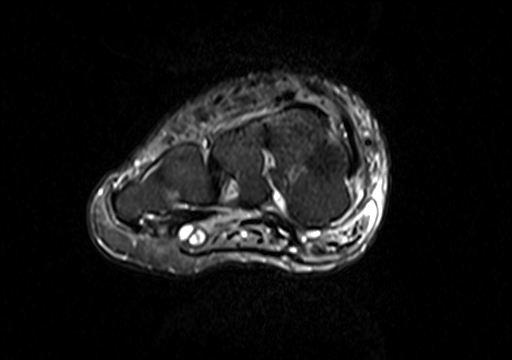

[Series 6: T1 · coronal · 3.0mm · 0.62mm/px · 7 of 42 slices shown (1 of 4)]
[im 1/42]
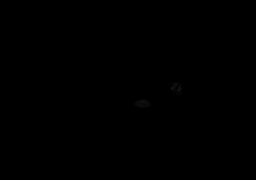
[im 7/42]
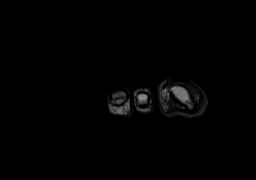
[im 14/42]
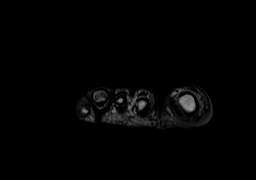
[im 21/42]
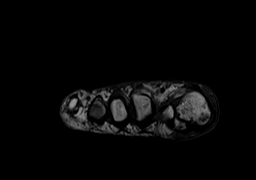
[im 28/42]
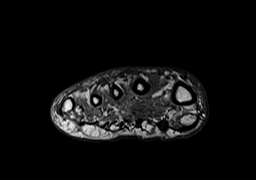
[im 35/42]
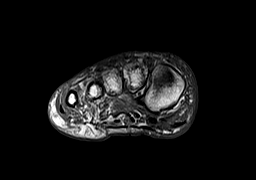
[im 42/42]
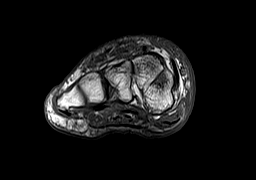

[Series 7: T1 · axial · 3.0mm · 0.35mm/px · z∈[-180,-101]mm · 4 of 22 slices shown (2 of 4)]
[im 1/22]
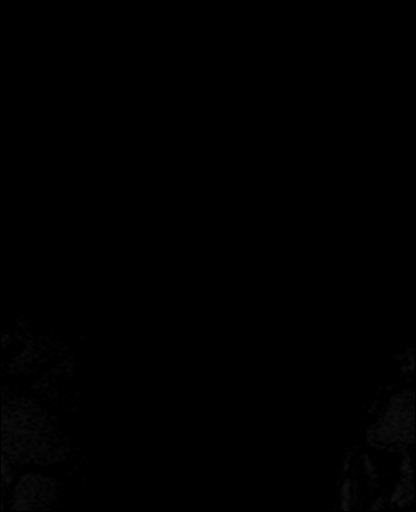
[im 8/22]
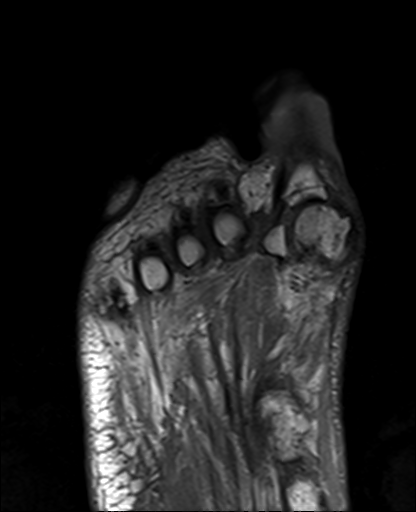
[im 15/22]
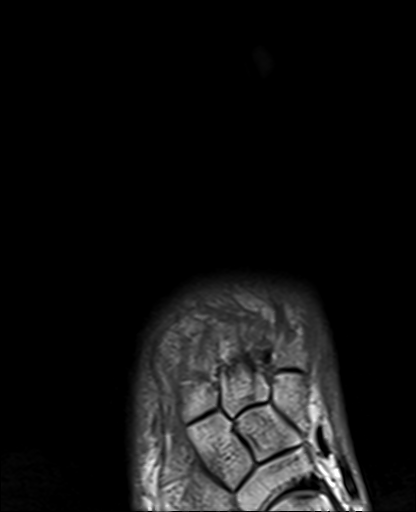
[im 22/22]
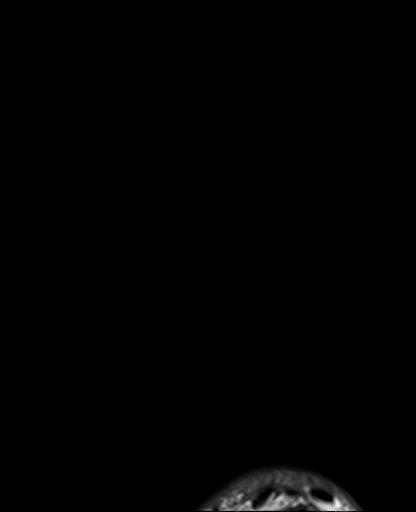

[Series 8: T2 fat-sat · axial · 3.0mm · 0.35mm/px · z∈[-180,-101]mm · 4 of 22 slices shown (2 of 4)]
[im 1/22]
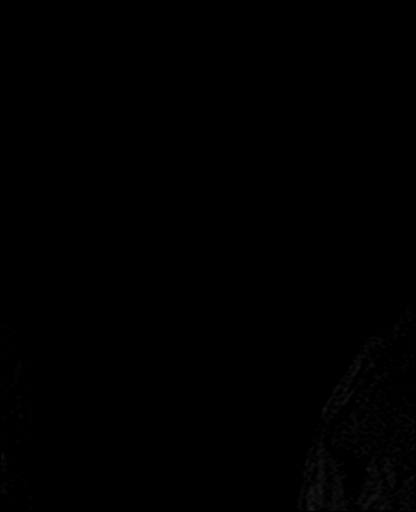
[im 8/22]
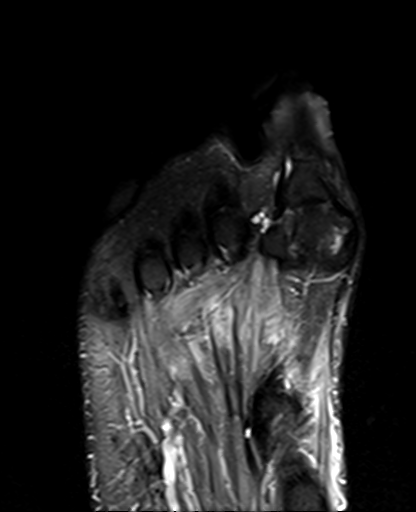
[im 15/22]
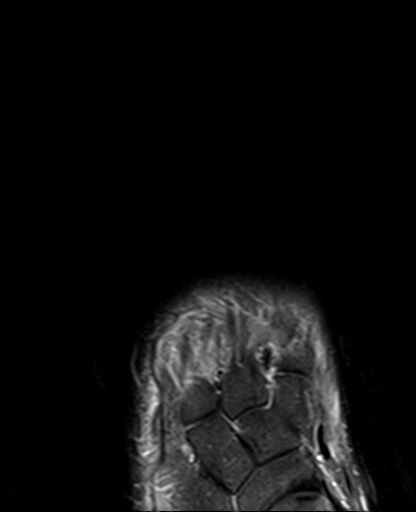
[im 22/22]
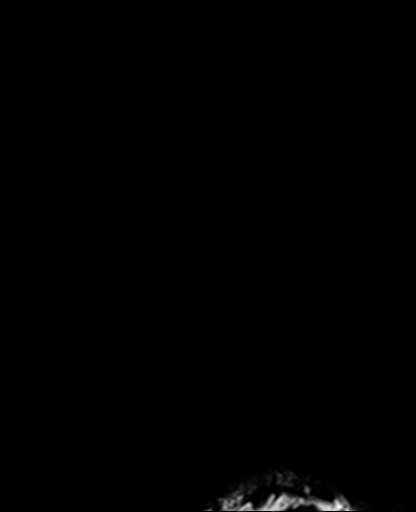

[Series 9: STIR · sagittal · 3.0mm · 0.35mm/px · 1 of 31 slices shown]
[im 1/31]
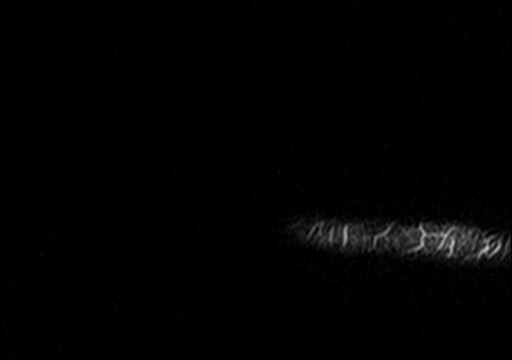

[Series 10: T1 · axial · 3.0mm · 0.20mm/px · z∈[-123,-88]mm · 2 of 10 slices shown (3 of 4)]
[im 1/10]
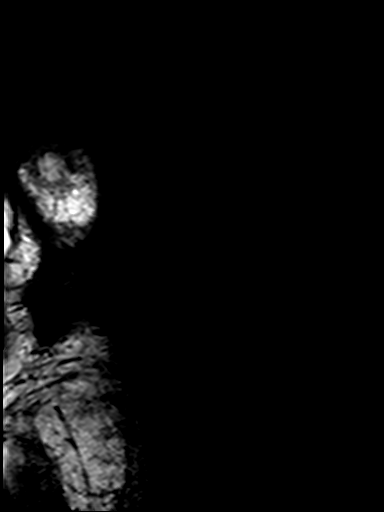
[im 10/10]
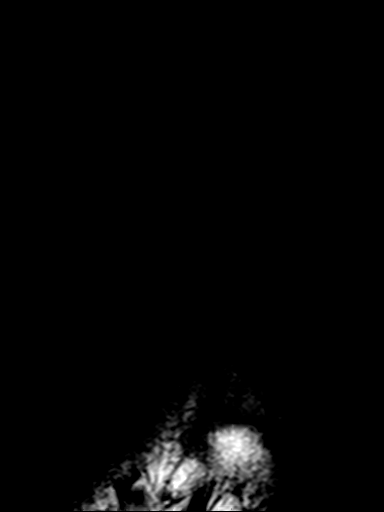

[Series 11: T2 fat-sat · axial · 3.0mm · 0.20mm/px · z∈[-123,-88]mm · 2 of 10 slices shown (3 of 4)]
[im 1/10]
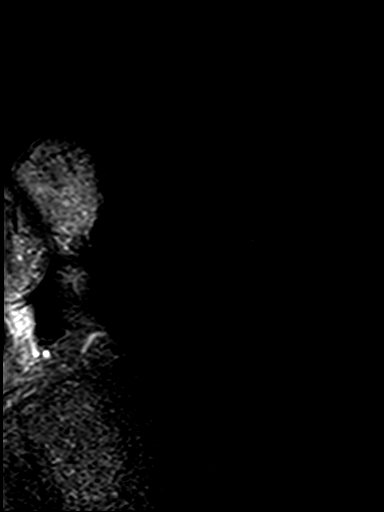
[im 10/10]
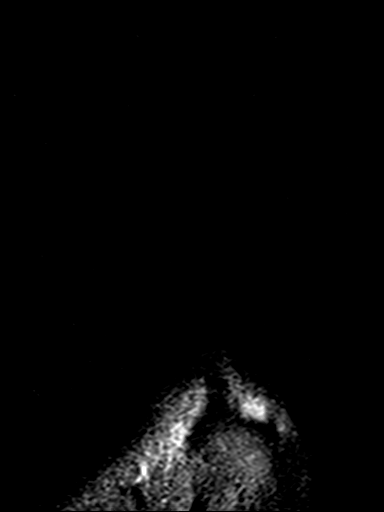

[Series 13: T1 · coronal · 3.0mm · 0.45mm/px · 4 of 24 slices shown (4 of 4)]
[im 1/24]
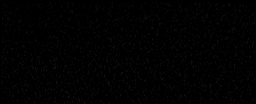
[im 8/24]
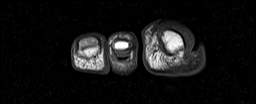
[im 16/24]
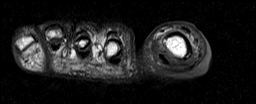
[im 24/24]
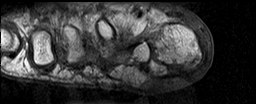

[Series 14: T2 fat-sat · coronal · 3.0mm · 0.22mm/px · 4 of 24 slices shown (4 of 4)]
[im 1/24]
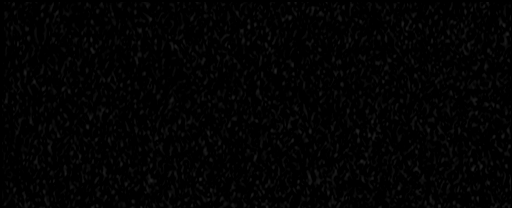
[im 8/24]
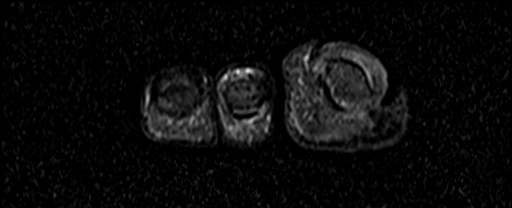
[im 16/24]
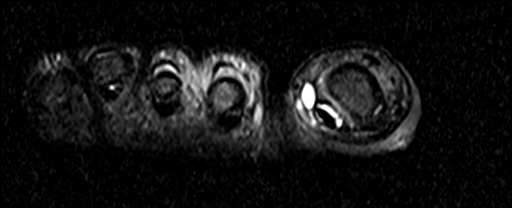
[im 24/24]
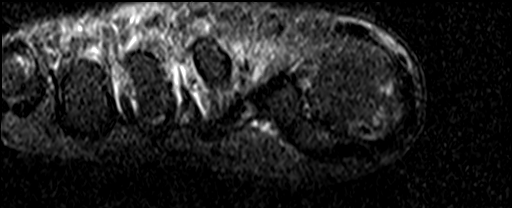

[34 of 40 positions shown; findings below may reference images not displayed]

FINDINGS: Bones/Joint/Cartilage

Mild subcortical marrow edema along the plantar medial aspect of the
great toe distal phalanx and great toe proximal phalanx near the
Possible small developing erosions at this location (series 13,
images 12-14). T1 marrow signal is otherwise preserved.

No acute fracture or dislocation. Moderate to severe osteoarthritis
of the first MTP joint with hallux valgus deformity and lateral
subluxation of the hallux sesamoids. There is associated reactive
marrow edema within the first metatarsal head. Marrow signal within
the forefoot is otherwise unremarkable.

Ligaments

Intact Lisfranc ligament. No evidence of acute collateral ligament
injury.

Muscles and Tendons

Diffuse edema-like signal throughout the intrinsic foot musculature
which may reflect a combination of denervation and myositis.
Tenosynovitis of the flexor hallucis longus tendon most pronounced
at the level of the great toe proximal phalanx. Well-defined
lobulated fluid collection abutting the lateral margin of the FHL
tendon sheath at the level of the great toe proximal phalanx
measuring 13 x 3 x 8 mm (series 14, image 10; series 8, image 13),
favored to reflect ganglion cyst.

Soft tissues

Soft tissue thickening with skin irregularity at the plantar medial
soft tissues at the level of the great toe IP joint.
IMPRESSION: 1. Mild subcortical marrow edema along the plantar-medial aspect of
the great toe distal phalanx and great toe proximal phalanx near the
interphalangeal joint. Possible small developing erosions at this
location. Findings are favored to reflect early acute osteomyelitis
given proximity to adjacent soft tissue wound/ulceration.
2. Tenosynovitis of the flexor hallucis longus tendon most
pronounced at the level of the great toe proximal phalanx. Adjacent
well-defined lobulated fluid collection along the lateral margin of
the tendon sheath measuring up to 13 mm is favored to reflect a
ganglion cyst. Small abscess not entirely excluded.
3. Diffuse edema-like signal throughout the intrinsic foot
musculature which may reflect a combination of denervation and
myositis.

These results will be called to the ordering clinician or
representative by the Radiologist Assistant, and communication
documented in the PACS or [REDACTED].

## 2021-12-06 ENCOUNTER — Ambulatory Visit (INDEPENDENT_AMBULATORY_CARE_PROVIDER_SITE_OTHER): Payer: Medicare Other | Admitting: Podiatry

## 2021-12-06 DIAGNOSIS — I999 Unspecified disorder of circulatory system: Secondary | ICD-10-CM | POA: Diagnosis not present

## 2021-12-06 DIAGNOSIS — E1142 Type 2 diabetes mellitus with diabetic polyneuropathy: Secondary | ICD-10-CM | POA: Diagnosis not present

## 2021-12-06 DIAGNOSIS — L97512 Non-pressure chronic ulcer of other part of right foot with fat layer exposed: Secondary | ICD-10-CM

## 2021-12-13 NOTE — Progress Notes (Signed)
?Subjective:  ?Patient ID: Randy Oneal, male    DOB: [birth year],  MRN: [id number] ? ?Chief Complaint  ?Patient presents with  ? Wound Check  ? ? ? ?56 y.o. male presents for wound care.  Patient presents with follow-up to right hallux IPJ ulceration.  Patient states is doing well.  He has been keeping a bandage with Betadine wet-to-dry on the right big toe.  He denies any other acute complaints no nausea fever chills vomiting. ? ?Review of Systems: Negative except as noted in the HPI. Denies N/V/F/Ch. ? ?Past Medical History:  ?Diagnosis Date  ? Anemia   ? Cataract 07/02/2020  ? Diabetes mellitus   ? Type 2   ? Diabetic retinopathy of both eyes ([facility]) 01/31/2019  ? Dr Coralyn Pear 01/2019  ? ESRD (end stage renal disease) ([facility])   ? [place] Frenisus  ? Herpes simplex virus (HSV) infection   ? OU  ? Hypertension   ? Hypertensive retinopathy   ? OU  ? Retinal detachment   ? OS  ? Sepsis due to Streptococcus, group B ([facility]) 10/05/2018  ? ? ?Current Outpatient Medications:  ?  acetaminophen (TYLENOL) 500 MG tablet, Take 1,000 mg by mouth every 6 (six) hours as needed for moderate pain or headache., Disp: , Rfl:  ?  amLODipine (NORVASC) 2.5 MG tablet, TAKE ONE TABLET BY MOUTH ONCE DAILY. (Patient taking differently: Take 5 mg by mouth daily.), Disp: 30 tablet, Rfl: 0 ?  amLODipine (NORVASC) 5 MG tablet, Take 2.5 mg by mouth in the morning., Disp: , Rfl:  ?  Bromfenac Sodium (PROLENSA) 0.07 % SOLN, Place 1 drop into both eyes 4 (four) times daily. (Patient taking differently: Place 1 drop into both eyes 3 (three) times daily.), Disp: 6 mL, Rfl: 10 ?  cinacalcet (SENSIPAR) 30 MG tablet, Take 30 mg by mouth daily with supper., Disp: , Rfl:  ?  Continuous Blood Gluc Sensor (DEXCOM G6 SENSOR) MISC, 1 Device by Does not apply route as directed., Disp: 3 each, Rfl: 11 ?  Continuous Blood Gluc Transmit (DEXCOM G6 TRANSMITTER) MISC, 1 Device by Does not apply route as directed., Disp: 1 each, Rfl: 3 ?  doxycycline (VIBRA-TABS) 100 MG  tablet, Take 1 tablet (100 mg total) by mouth 2 (two) times daily., Disp: 60 tablet, Rfl: 0 ?  glucose blood (ONETOUCH ULTRA) test strip, 1 each by Other route in the morning, at noon, in the evening, and at bedtime. Use as instructed, Disp: 400 strip, Rfl: 3 ?  insulin aspart (NOVOLOG FLEXPEN) 100 UNIT/ML FlexPen, Inject 7 Units into the skin 3 (three) times daily with meals., Disp: 30 mL, Rfl: 3 ?  Insulin Pen Needle 32G X 4 MM MISC, 1 Device by Does not apply route in the morning, at noon, in the evening, and at bedtime., Disp: 400 each, Rfl: 1 ?  lidocaine-prilocaine (EMLA) cream, Apply 1 application topically Every Tuesday,Thursday,and Saturday with dialysis., Disp: , Rfl:  ?  Methoxy PEG-Epoetin Beta (MIRCERA IJ), Mircera, Disp: , Rfl:  ?  MICROLET LANCETS MISC, USE FOUR TIMES A DAY AS DIRECTED., Disp: 100 each, Rfl: 0 ?  nystatin (MYCOSTATIN/NYSTOP) powder, Apply topically 2 (two) times daily. To groin and scrotum (Patient taking differently: Apply 1 g topically 2 (two) times daily as needed (rash). To groin and scrotum), Disp: 15 g, Rfl: 0 ?  prednisoLONE acetate (PRED FORTE) 1 % ophthalmic suspension, Place 1 drop into both eyes 4 (four) times daily. (Patient taking differently: Place 1 drop into both  eyes 3 (three) times daily.), Disp: 15 mL, Rfl: 3 ?  sucroferric oxyhydroxide (VELPHORO) 500 MG chewable tablet, Chew 1,000 mg by mouth 3 (three) times daily with meals., Disp: , Rfl:  ?  TRESIBA FLEXTOUCH 100 UNIT/ML FlexTouch Pen, Inject 22 Units into the skin at bedtime., Disp: 30 mL, Rfl: 1 ? ?Social History  ? ?Tobacco Use  ?Smoking Status Never  ?Smokeless Tobacco Never  ? ? ?Allergies  ?Allergen Reactions  ? Tramadol Nausea And Vomiting  ? ?Objective:  ?There were no vitals filed for this visit. ?There is no height or weight on file to calculate BMI. ?Constitutional Well developed. ?Well nourished.  ?Vascular Dorsalis pedis pulses faintly palpable bilaterally. ?Posterior tibial pulses faintly palpable  bilaterally. ?Capillary refill normal to all digits.  ?No cyanosis or clubbing noted. ?Pedal hair growth normal.  ?Neurologic Normal speech. ?Oriented to person, place, and time. ?Protective sensation absent  ?Dermatologic Wound Location: Right hallux IPJ probing down to bone. No clinical signs of infection including erythema/cellulitis noted.  It is probing down to bone and deeper tissue.  No purulent drainage noted. ?Wound Base: Mixed Granular/Fibrotic ?Peri-wound: Macerated ?Exudate: Scant/small amount Serous exudate ?Wound Measurements: ?-See below  ?Orthopedic: No pain to palpation either foot.  ? ?Radiographs: 3 views of skeletally mature the right foot: No acute breakdown of osteomyelitis changes noted to the bone.  Soft tissue ulceration/deficit noted.  No other bony abnormalities identified. ?1. Soft tissue wound of the great toe. Mild bone marrow edema at the ?base of the first distal phalanx concerning for osteomyelitis. No ?drainable fluid collection to suggest an abscess. ?2. Small amount of fluid in the flexor hallucis tendon sheath as can ?be seen with tenosynovitis which may be secondary to an inflammatory ?or infectious etiology. ?  ?Assessment:  ? ?No diagnosis found. ? ? ? ? ? ?Plan:  ?Patient was evaluated and treated and all questions answered. ? ?Ulcer right hallux nail probing down to bone ?-Debridement as below. ?-Dressed with Betadine wet-to-dry, DSD. ?-Continue off-loading with surgical shoe. ?-Patient will go back to surgical shoe.  New surgical shoe was dispensed. ?-The MRI was reviewed with the patient which shows that there might be acute signs of early osteomyelitis in the distal phalanx as well as the proximal phalanx near the interphalangeal joint.  This seems to clinically correlate with the wound.  -Patient had vascular intervention done by Dr. Earleen Newport.  He had angioplasty of posterior tibial and plantar arteries, AT and peroneal artery. ?-I will place him on antibiotics doxycycline  and to resolve meant of the wound ?-Repeat MRI shows osteomyelitic changes as well however patient does not want amputation. ? ?Procedure: Excisional Debridement of Wound~stagnant ?Tool: Sharp chisel blade/tissue nipper ?Rationale: Removal of non-viable soft tissue from the wound to promote healing.  ?Anesthesia: none ?Pre-Debridement Wound Measurements: 0.8 cm x 0.3 cm x 0.5 cm  ?Post-Debridement Wound Measurements: 0.9 cm x 0.3 cm x 0.5 cm  ?Type of Debridement: Sharp Excisional ?Tissue Removed: Non-viable soft tissue ?Blood loss: Minimal (<50cc) ?Depth of Debridement: subcutaneous tissue. ?Technique: Sharp excisional debridement to bleeding, viable wound base.  ?Wound Progress: The wound appears to be stagnant at this time. ?Dressing: Dry, sterile, compression dressing. ?Disposition: Patient tolerated procedure well. Patient to return in 1 week for follow-up. ? ?No follow-ups on file. ? ?  ?

## 2021-12-31 ENCOUNTER — Encounter (INDEPENDENT_AMBULATORY_CARE_PROVIDER_SITE_OTHER): Payer: Medicare Other | Admitting: Ophthalmology

## 2021-12-31 DIAGNOSIS — I10 Essential (primary) hypertension: Secondary | ICD-10-CM

## 2021-12-31 DIAGNOSIS — Z961 Presence of intraocular lens: Secondary | ICD-10-CM

## 2021-12-31 DIAGNOSIS — H35033 Hypertensive retinopathy, bilateral: Secondary | ICD-10-CM

## 2021-12-31 DIAGNOSIS — H3343 Traction detachment of retina, bilateral: Secondary | ICD-10-CM

## 2021-12-31 DIAGNOSIS — E113513 Type 2 diabetes mellitus with proliferative diabetic retinopathy with macular edema, bilateral: Secondary | ICD-10-CM

## 2021-12-31 DIAGNOSIS — H4313 Vitreous hemorrhage, bilateral: Secondary | ICD-10-CM

## 2021-12-31 NOTE — Progress Notes (Addendum)
[facility] Note  01/09/2022    CHIEF COMPLAINT Patient presents for Retina Follow Up  HISTORY OF PRESENT ILLNESS: Randy Oneal is a 56 y.o. male who presents to the clinic today for:  HPI     Retina Follow Up   Patient presents with  Diabetic Retinopathy (IVE OU #15 (04.11.23)).  In both eyes.  This started years ago.  Severity is moderate.  Duration of 7 weeks.  Since onset it is stable.  I, the attending physician,  performed the HPI with the patient and updated documentation appropriately.        Comments   Patient states that the vision has remained unchanged since his last appointment. His blood sugar was 175 yesterday and his A1C is 8.9.      Last edited by Bernarda Caffey, MD on 01/09/2022 11:27 AM.     Pt vision is stable. Still on Dialysis. No new problems with kidney's.   Referring physician: [name], MD [place],  [place] [address]  HISTORICAL INFORMATION:   Selected notes from the MEDICAL RECORD NUMBER Referred by Dr.Mark Gershon Crane for concern of decreased vision post cataract sx   CURRENT MEDICATIONS: Current Outpatient Medications (Ophthalmic Drugs)  Medication Sig   Bromfenac Sodium (PROLENSA) 0.07 % SOLN Place 1 drop into both eyes 4 (four) times daily. (Patient taking differently: Place 1 drop into both eyes 3 (three) times daily.)   prednisoLONE acetate (PRED FORTE) 1 % ophthalmic suspension Place 1 drop into both eyes 4 (four) times daily. (Patient taking differently: Place 1 drop into both eyes 3 (three) times daily.)   No current facility-administered medications for this visit. (Ophthalmic Drugs)   Current Outpatient Medications (Other)  Medication Sig   acetaminophen (TYLENOL) 500 MG tablet Take 1,000 mg by mouth every 6 (six) hours as needed for moderate pain or headache.   Continuous Blood Gluc Sensor (DEXCOM G6 SENSOR) MISC 1 Device by Does not apply route as directed.   Continuous Blood  Gluc Transmit (DEXCOM G6 TRANSMITTER) MISC 1 Device by Does not apply route as directed.   glucose blood (ONETOUCH ULTRA) test strip 1 each by Other route in the morning, at noon, in the evening, and at bedtime. Use as instructed   insulin aspart (NOVOLOG FLEXPEN) 100 UNIT/ML FlexPen Inject 7 Units into the skin 3 (three) times daily with meals.   Insulin Pen Needle 32G X 4 MM MISC 1 Device by Does not apply route in the morning, at noon, in the evening, and at bedtime.   lidocaine-prilocaine (EMLA) cream Apply 1 application topically Every Tuesday,Thursday,and Saturday with dialysis.   MICROLET LANCETS MISC USE FOUR TIMES A DAY AS DIRECTED.   nystatin (MYCOSTATIN/NYSTOP) powder Apply topically 2 (two) times daily. To groin and scrotum (Patient taking differently: Apply 1 g topically 2 (two) times daily as needed (rash). To groin and scrotum)   sucroferric oxyhydroxide (VELPHORO) 500 MG chewable tablet Chew 1,000 mg by mouth 3 (three) times daily with meals.   TRESIBA FLEXTOUCH 100 UNIT/ML FlexTouch Pen Inject 22 Units into the skin at bedtime.   amLODipine (NORVASC) 2.5 MG tablet TAKE ONE TABLET BY MOUTH ONCE DAILY. (Patient not taking: Reported on 01/09/2022)   amLODipine (NORVASC) 5 MG tablet Take 2.5 mg by mouth in the morning. (Patient not taking: Reported on 01/09/2022)   cinacalcet (SENSIPAR) 30 MG tablet Take 30 mg by mouth daily with supper. (Patient not taking: Reported on 01/09/2022)  doxycycline (VIBRA-TABS) 100 MG tablet Take 1 tablet (100 mg total) by mouth 2 (two) times daily. (Patient not taking: Reported on 01/09/2022)   Methoxy PEG-Epoetin Beta (MIRCERA IJ) Mircera (Patient not taking: Reported on 01/09/2022)   No current facility-administered medications for this visit. (Other)   REVIEW OF SYSTEMS: ROS   Positive for: Genitourinary, Endocrine, Eyes Negative for: Constitutional, Gastrointestinal, Neurological, Skin, Musculoskeletal, HENT, Cardiovascular, Respiratory, Psychiatric,  Allergic/Imm, Heme/Lymph Last edited by Annie Paras, COT on 01/09/2022  9:17 AM.     ALLERGIES Allergies  Allergen Reactions   Tramadol Nausea And Vomiting   PAST MEDICAL HISTORY Past Medical History:  Diagnosis Date   Anemia    Cataract 07/02/2020   Diabetes mellitus    Type 2    Diabetic retinopathy of both eyes ([place]) 01/31/2019   Dr Coralyn Pear 01/2019   ESRD (end stage renal disease) ([facility])    [place] Frenisus   Herpes simplex virus (HSV) infection    OU   Hypertension    Hypertensive retinopathy    OU   Retinal detachment    OS   Sepsis due to Streptococcus, group B ([place]) 10/05/2018   Past Surgical History:  Procedure Laterality Date   25 GAUGE PARS PLANA VITRECTOMY WITH 20 GAUGE MVR PORT FOR MACULAR HOLE Right 02/24/2019   Procedure: 25 GAUGE PARS PLANA VITRECTOMY WITH 20 GAUGE MVR PORT FOR MACULAR HOLE;  Surgeon: Bernarda Caffey, MD;  Location: [facility];  Service: Ophthalmology;  Laterality: Right;   APPLICATION OF WOUND VAC Left 08/27/2018   Procedure: APPLICATION OF WOUND VAC, left neck;  Surgeon: Gaye Pollack, MD;  Location: [facility];  Service: Thoracic;  Laterality: Left;   APPLICATION OF WOUND VAC Left 08/30/2018   Procedure: APPLICATION OF WOUND VAC;  Surgeon: Gaye Pollack, MD;  Location: [facility];  Service: Thoracic;  Laterality: Left;   AV FISTULA PLACEMENT Left 02/15/2020   Procedure: LEFT ARM ARTERIOVENOUS (AV) FISTULA CREATION;  Surgeon: Waynetta Sandy, MD;  Location: [facility];  Service: Vascular;  Laterality: Left;   CATARACT EXTRACTION Bilateral    CATARACT EXTRACTION W/ INTRAOCULAR LENS  IMPLANT, BILATERAL     COLONOSCOPY  06/24/2011   Procedure: COLONOSCOPY;  Surgeon: [name], MD;  [facility];  Service: Endoscopy;  Laterality: N/A;  8:30 AM   EYE SURGERY     GAS/FLUID EXCHANGE Left 03/31/2019   Procedure: Gas/Fluid Exchange;  Surgeon: Bernarda Caffey, MD;  Location: [facility];  Service: Ophthalmology;  Laterality: Left;   INJECTION OF  SILICONE OIL  3/71/0626   Procedure: Injection Of Silicone Oil;  Surgeon: Bernarda Caffey, MD;  Location: [facility];  Service: Ophthalmology;;   INSERTION OF DIALYSIS CATHETER Right 12/05/2019   Procedure: INSERTION OF DIALYSIS CATHETER RIGHT SUBCLAVIAN;  Surgeon: Virl Cagey, MD;  Location: [facility];  Service: General;  Laterality: Right;   INSERTION OF DIALYSIS CATHETER Right 12/07/2019   Procedure: Minor Dialysis catheter in place- need to reposition/ adjust catheter to help with flows;  Surgeon: Virl Cagey, MD;  Location: [facility];  Service: General;  Laterality: Right;  procedure room case with 1% lidocaine, full sterile drape,  towels, full gown, large chloraprep, 4-0 Monocryl and dermabond    INSERTION OF DIALYSIS CATHETER Right 12/08/2019   Procedure: INSERTION OF DIALYSIS CATHETER EXCHANGE;  Surgeon: Virl Cagey, MD;  [facility]: [facility];  Service: General;  Laterality: Right;   IR ANGIOGRAM EXTREMITY RIGHT  03/15/2021   IR RADIOLOGIST EVAL & MGMT  02/07/2021   IR RADIOLOGIST EVAL & MGMT  05/09/2021   IR US GUIDE VASC ACCESS RIGHT  03/15/2021   MEMBRANE PEEL Right 02/24/2019   Procedure: Antoine Primas;  Surgeon: Bernarda Caffey, MD;  Location: [facility];  Service: Ophthalmology;  Laterality: Right;   PARS PLANA VITRECTOMY Left 03/31/2019   Procedure: PARS PLANA VITRECTOMY WITH 25 GAUGE WITH MEMBRANE PEEL;  Surgeon: Bernarda Caffey, MD;  Location: [facility];  Service: Ophthalmology;  Laterality: Left;   PHOTOCOAGULATION WITH LASER Right 02/24/2019   Procedure: Photocoagulation With Laser;  Surgeon: Bernarda Caffey, MD;  Location: [facility];  Service: Ophthalmology;  Laterality: Right;   PHOTOCOAGULATION WITH LASER Left 03/31/2019   Procedure: Photocoagulation With Laser;  Surgeon: Bernarda Caffey, MD;  Location: [facility];  Service: Ophthalmology;  Laterality: Left;   RETINAL DETACHMENT SURGERY Left 03/31/2019   TRD Repair - Dr. Bernarda Caffey   SILICON OIL REMOVAL Right 7/59/1638   Procedure: Silicon Oil Removal;   Surgeon: Bernarda Caffey, MD;  Location: [facility];  Service: Ophthalmology;  Laterality: Right;   STERNAL WOUND DEBRIDEMENT Left 08/27/2018   Procedure: Incision and DEBRIDEMENT Left Chest, Neck and Mediastinum;  Surgeon: Gaye Pollack, MD;  [facility];  Service: Thoracic;  Laterality: Left;   STERNAL WOUND DEBRIDEMENT Left 08/30/2018   Procedure: WOUND VAC CHANGE, LEFT CHEST AND NECK, POSSIBLE DEBRIDEMENT;  Surgeon: Gaye Pollack, MD;  Location[facility];  Service: Thoracic;  Laterality: Left;   FAMILY HISTORY Family History  Problem Relation Age of Onset   Hypertension Mother    Colon cancer Neg Hx    SOCIAL HISTORY Social History   Tobacco Use   Smoking status: Never   Smokeless tobacco: Never  Vaping Use   Vaping Use: Never used  Substance Use Topics   Alcohol use: No   Drug use: No       OPHTHALMIC EXAM:  Base Eye Exam     Visual Acuity (Snellen - Linear)       Right Left   Dist cc 20/200 20/200   Dist ph cc 20/150 +2 20/150 +1    Correction: Glasses         Tonometry (Tonopen, 9:26 AM)       Right Left   Pressure 11 13         Pupils       Dark Light Shape React APD   Right 4 3 Round Minimal None   Left 4 3 Round Minimal None         Visual Fields       Left Right   Restrictions Partial outer superior temporal, inferior temporal, inferior nasal deficiencies Partial outer superior temporal, inferior temporal deficiencies         Extraocular Movement       Right Left    Full, Ortho Full, Ortho         Neuro/Psych     Oriented x3: Yes   Mood/Affect: Normal         Dilation     Both eyes: 1.0% Mydriacyl, 2.5% Phenylephrine @ 9:20 AM           Slit Lamp and Fundus Exam     Slit Lamp Exam       Right Left   Lids/Lashes mild Meibomian gland dysfunction mild Meibomian gland dysfunction   Conjunctiva/Sclera subconj silicon oil bubbles, Chemosis, conj Cyst White and quiet   Cornea Trace PEE, Well healed cataract wound,  arcus, Debris in tear film Trace  PEE, Well healed cataract wound, arcus, Debris in tear film   Anterior Chamber Deep and quiet, no cell or flare Deep and quiet, no cell or flare   Iris slightly irregular dilation, posterior synchiae from 3:00-1200 round, focal Posterior synechiae at 1030   Lens PC IOL in good position, pigment deposition, 1+ PCO, mild pigment deposition on optic PC IOL in good position, mild pigment deposition   Anterior Vitreous Post vit, silicon oil bubble ~90% post vitrectomy, good silicone oil fill         Fundus Exam       Right Left   Disc 2+pallor, sharp rim, mild fibrosis along SN rim +pallor, sharp rim, fine NVD--regressed, mild fibrosis   C/D Ratio 0.2 0.2   Macula attached under oil, scattered MA/IRH -- improved, nasal and central thickening / edema -- slightly increased attached under oil, persistent fibrosis, edema, focal bands of PRF with traction emanating from ST macula -- stable, focal DBH temporal macula--improved   Vessels attenuated, Tortuous Attenuated, dilated venules, Tortuous   Periphery Attached, dense 360 PRP laser, mild scattered fibrosis - persistent, Focal ret hole along distal IT arcades--good laser surrounding Attached, good 360 PRP laser changes, pre-retinal fibrosis extending to posterior PRP border superior and inferiorly           IMAGING AND PROCEDURES  Imaging and Procedures for _0 @  OCT, Retina - OU - Both Eyes       Right Eye Quality was good. Central Foveal Thickness: 665. Progression has worsened. Findings include preretinal fibrosis, abnormal foveal contour, outer retinal atrophy, epiretinal membrane, intraretinal fluid, no SRF (Mild Interval increase in IRF/edema nasal macula).   Left Eye Quality was good. Central Foveal Thickness: 546. Progression has improved. Findings include preretinal fibrosis, intraretinal fluid, abnormal foveal contour, intraretinal hyper-reflective material, epiretinal membrane, no SRF  (Persistent IRF/IRHM/PRF -- mild interval improvement in central edema).   Notes *Images captured and stored on drive  Diagnosis / Impression:  OD: mild interval increase in IRF/edema nasal macula OS: Persistent IRF/IRHM/PRF -- mild interval improvement in central edema  Clinical management:  See below  Abbreviations: NFP - Normal foveal profile. CME - cystoid macular edema. PED - pigment epithelial detachment. IRF - intraretinal fluid. SRF - subretinal fluid. EZ - ellipsoid zone. ERM - epiretinal membrane. ORA - outer retinal atrophy. ORT - outer retinal tubulation. SRHM - subretinal hyper-reflective material      Intravitreal Injection, Pharmacologic Agent - OD - Right Eye       Time Out 01/09/2022. 10:25 AM. Confirmed correct patient, procedure, site, and patient consented.   Anesthesia Topical anesthesia was used. Anesthetic medications included Lidocaine 2%, Proparacaine 0.5%.   Procedure Preparation included eyelid speculum, 5% betadine to ocular surface. A (32g) needle was used.   Injection: 2 mg aflibercept 2 MG/0.05ML   Route: Intravitreal, Site: Right Eye   NDC: A3590391, Lot: [id number], Expiration date: 11/09/2022, Waste: 0 mL   Post-op Post injection exam found visual acuity of at least counting fingers. The patient tolerated the procedure well. There were no complications. The patient received written and verbal post procedure care education. Post injection medications were not given.      Intravitreal Injection, Pharmacologic Agent - OS - Left Eye       Time Out 01/09/2022. 10:25 AM. Confirmed correct patient, procedure, site, and patient consented.   Anesthesia Topical anesthesia was used. Anesthetic medications included Lidocaine 2%, Proparacaine 0.5%.   Procedure Preparation included 5% betadine to ocular surface, eyelid speculum. A (  32g) needle was used.   Injection: 2 mg aflibercept 2 MG/0.05ML   Route: Intravitreal, Site: Left Eye   NDC:  A3590391, Lot: [id number], Expiration date: 11/09/2022, Waste: 0 mL   Post-op Post injection exam found visual acuity of at least counting fingers. The patient tolerated the procedure well. There were no complications. The patient received written and verbal post procedure care education. Post injection medications were not given.            ASSESSMENT/PLAN:    ICD-10-CM   1. Proliferative diabetic retinopathy of both eyes with macular edema associated with type 2 diabetes mellitus (HCC)  E11.3513 OCT, Retina - OU - Both Eyes    Intravitreal Injection, Pharmacologic Agent - OD - Right Eye    Intravitreal Injection, Pharmacologic Agent - OS - Left Eye    aflibercept (EYLEA) SOLN 2 mg    aflibercept (EYLEA) SOLN 2 mg    2. Retinal detachment, tractional, bilateral  H33.43     3. Vitreous hemorrhage of both eyes ([place])  H43.13     4. Essential hypertension  I10     5. Hypertensive retinopathy of both eyes  H35.033     6. Pseudophakia of both eyes  Z96.1      1-3. Proliferative diabetic retinopathy with DME, TRD, and vitreous hemorrhage OU (OD > OS)  - delayed f/u - 7 wks instead of 6  - last A1c: 9.6 on 11.11.22  - lost to f/u from Jan 2021 to Oct 2021 due to loss of insurance coverage  - pt with complex medical history with hospitalization in Jan-Feb 2020 for bacteremia and abscess  - history of poor glycemic control for years  - s/p IVA OD #1 (06.20.20), #2 (07.01.20), #3 (09.11.20), #4 (10.09.20), #5 (11.06.20), #6 (11.24.21), #7 (12.22.21), #8 (01.19.22)  - s/p IVA OS #1 (06.20.20), #2 (07.01.20), #3 (08.13.20), #4 (09.11.20)  #5 (11.06.20), #6 (11.24.21), #7 (12.22.21), #8 (01.19.22)  - s/p IVE OU #1 (12.04.20) - sample; #2 (01.06.21), #3 (02.18.22), #4 (03.18.22), #5 (04.18.22), #6 (05.25.22), #7 (07.01.22), #8 (07.29.22), #9 (09.02.22), #10 (09.30.22), #11 (11.04.22), #12 (12.09.23), #13 (01.13.23), #14 (03.02.23), #15 (04.11.23)  - s/p STK OS #1 (10.09.20)  - s/p PRP OS  (06.10.20)  - s/p PPV/PFC/EL/FAX/silicone oil OD, 14.43.15  - s/p subconj silicon oil removal OD 8.20.20  - s/p PPV/EL/FAX/silicone oil OS, 40.08.67  - BCVA 20/150 OU (stable)             - OCT: OD: mild interval increase in IRF/edema nasal macula; OS: Persistent IRF/IRHM/PRF -- mild interval improvement in central edema at 7 weeks  - recommend IVE OU #16 for DME today, 06.01.23 with f/u at 6 weeks  - RBA of procedure discussed, questions answered  - informed consent obtained and re-signed 06.01.23  - see procedure note             - Eylea benefits investigation begun 01.19.22 -- approved  - discussed possible need for PPV w/ membrane peel and silicon oil exchange OS due to persistent fibrosis  - cont topical PF and Prolensa QID OU for possible CME component  - f/u 6 weeks -- DFE/OCT, possible injection  4,5. Hypertensive retinopathy OU  - discussed importance of tight BP control  - monitor  6. Pseudophakia OU  - s/p CE with IOL (Dr. Gershon Crane)  Ophthalmic Meds Ordered this visit:  Meds ordered this encounter  Medications   aflibercept (EYLEA) SOLN 2 mg   aflibercept (EYLEA) SOLN 2 mg  Return in about 6 weeks (around 02/20/2022) for f/u PDR OU, DFE, OCT.  There are no Patient Instructions on file for this visit.  This document serves as a record of services personally performed by Gardiner Sleeper, MD, PhD. It was created on their behalf by San Jetty. Owens Shark, OA an ophthalmic technician. The creation of this record is the provider's dictation and/or activities during the visit.    Electronically signed by: San Jetty. Owens Shark, [place] 05.23.2023 11:34 AM  Gardiner Sleeper, M.D., Ph.D. Diseases & Surgery of the Retina and Vitreous [facility]  I have reviewed the above documentation for accuracy and completeness, and I agree with the above. Gardiner Sleeper, M.D., Ph.D. 01/09/22 11:34 AM  Abbreviations: M myopia (nearsighted); A astigmatism; H hyperopia  (farsighted); P presbyopia; Mrx spectacle prescription;  CTL contact lenses; OD right eye; OS left eye; OU both eyes  XT exotropia; ET esotropia; PEK punctate epithelial keratitis; PEE punctate epithelial erosions; DES dry eye syndrome; MGD meibomian gland dysfunction; ATs artificial tears; PFAT's preservative free artificial tears; [place] nuclear sclerotic cataract; PSC posterior subcapsular cataract; ERM epi-retinal membrane; PVD posterior vitreous detachment; RD retinal detachment; DM diabetes mellitus; DR diabetic retinopathy; NPDR non-proliferative diabetic retinopathy; PDR proliferative diabetic retinopathy; CSME clinically significant macular edema; DME diabetic macular edema; dbh dot blot hemorrhages; CWS cotton wool spot; POAG primary open angle glaucoma; C/D cup-to-disc ratio; HVF humphrey visual field; GVF goldmann visual field; OCT optical coherence tomography; IOP intraocular pressure; BRVO Branch retinal vein occlusion; CRVO central retinal vein occlusion; CRAO central retinal artery occlusion; BRAO branch retinal artery occlusion; RT retinal tear; SB scleral buckle; PPV pars plana vitrectomy; VH Vitreous hemorrhage; PRP panretinal laser photocoagulation; IVK intravitreal kenalog; VMT vitreomacular traction; MH Macular hole;  NVD neovascularization of the disc; NVE neovascularization elsewhere; AREDS age related eye disease study; ARMD age related macular degeneration; POAG primary open angle glaucoma; EBMD epithelial/anterior basement membrane dystrophy; ACIOL anterior chamber intraocular lens; IOL intraocular lens; PCIOL posterior chamber intraocular lens; Phaco/IOL phacoemulsification with intraocular lens placement; [place] photorefractive keratectomy; LASIK laser assisted in situ keratomileusis; HTN hypertension; DM diabetes mellitus; COPD chronic obstructive pulmonary disease

## 2022-01-09 ENCOUNTER — Encounter (INDEPENDENT_AMBULATORY_CARE_PROVIDER_SITE_OTHER): Payer: Self-pay | Admitting: Ophthalmology

## 2022-01-09 ENCOUNTER — Ambulatory Visit (INDEPENDENT_AMBULATORY_CARE_PROVIDER_SITE_OTHER): Payer: Medicare Other | Admitting: Ophthalmology

## 2022-01-09 DIAGNOSIS — H4313 Vitreous hemorrhage, bilateral: Secondary | ICD-10-CM

## 2022-01-09 DIAGNOSIS — E113513 Type 2 diabetes mellitus with proliferative diabetic retinopathy with macular edema, bilateral: Secondary | ICD-10-CM

## 2022-01-09 DIAGNOSIS — H35033 Hypertensive retinopathy, bilateral: Secondary | ICD-10-CM

## 2022-01-09 DIAGNOSIS — I10 Essential (primary) hypertension: Secondary | ICD-10-CM | POA: Diagnosis not present

## 2022-01-09 DIAGNOSIS — H3343 Traction detachment of retina, bilateral: Secondary | ICD-10-CM

## 2022-01-09 DIAGNOSIS — Z961 Presence of intraocular lens: Secondary | ICD-10-CM

## 2022-01-09 MED ORDER — AFLIBERCEPT 2MG/0.05ML IZ SOLN FOR KALEIDOSCOPE
2.0000 mg | INTRAVITREAL | Status: AC | PRN
  Administered 2022-01-09: 2 mg via INTRAVITREAL

## 2022-01-21 ENCOUNTER — Telehealth: Payer: Self-pay | Admitting: *Deleted

## 2022-01-21 NOTE — Telephone Encounter (Signed)
Patient is requesting his progress notes related to his right great toe faxed to dialysis for reentrance back into the transplant list.  He wanted this faxed to: Attn: Donalynn Furlong '@[phone]'$ [phone] # [phone] Faxed notes, received confirmation 01/21/22.

## 2022-01-31 ENCOUNTER — Telehealth: Payer: Self-pay | Admitting: *Deleted

## 2022-02-10 NOTE — Progress Notes (Signed)
[facility] Note  02/18/2022    CHIEF COMPLAINT Patient presents for Retina Follow Up  HISTORY OF PRESENT ILLNESS: Randy Oneal is a 56 y.o. male who presents to the clinic today for:  HPI     Retina Follow Up   Patient presents with  Diabetic Retinopathy.  In both eyes.  This started 6 weeks ago.  I, the attending physician,  performed the HPI with the patient and updated documentation appropriately.        Comments   Patient here for 6 weeks retina follow up for PDR OU. Patient states vision pretty good. No eye pain.       Last edited by Bernarda Caffey, MD on 02/18/2022  4:34 PM.    Pt states vision is stable, no new health concerns  Referring physician: [name], MD [address] [place],  [place] [address]  HISTORICAL INFORMATION:   Selected notes from the MEDICAL RECORD NUMBER Referred by Dr.Mark Gershon Crane for concern of decreased vision post cataract sx   CURRENT MEDICATIONS: Current Outpatient Medications (Ophthalmic Drugs)  Medication Sig   prednisoLONE acetate (PRED FORTE) 1 % ophthalmic suspension Place 1 drop into both eyes 4 (four) times daily. (Patient taking differently: Place 1 drop into both eyes 3 (three) times daily.)   Bromfenac Sodium (PROLENSA) 0.07 % SOLN Place 1 drop into both eyes 4 (four) times daily. (Patient not taking: Reported on 02/18/2022)   No current facility-administered medications for this visit. (Ophthalmic Drugs)   Current Outpatient Medications (Other)  Medication Sig   acetaminophen (TYLENOL) 500 MG tablet Take 1,000 mg by mouth every 6 (six) hours as needed for moderate pain or headache.   Continuous Blood Gluc Sensor (DEXCOM G6 SENSOR) MISC 1 Device by Does not apply route as directed.   Continuous Blood Gluc Transmit (DEXCOM G6 TRANSMITTER) MISC 1 Device by Does not apply route as directed.   glucose blood (ONETOUCH ULTRA) test strip 1 each by Other route in the morning, at noon, in  the evening, and at bedtime. Use as instructed   insulin aspart (NOVOLOG FLEXPEN) 100 UNIT/ML FlexPen Inject 7 Units into the skin 3 (three) times daily with meals.   Insulin Pen Needle 32G X 4 MM MISC 1 Device by Does not apply route in the morning, at noon, in the evening, and at bedtime.   lidocaine-prilocaine (EMLA) cream Apply 1 application topically Every Tuesday,Thursday,and Saturday with dialysis.   MICROLET LANCETS MISC USE FOUR TIMES A DAY AS DIRECTED.   nystatin (MYCOSTATIN/NYSTOP) powder Apply topically 2 (two) times daily. To groin and scrotum (Patient taking differently: Apply 1 g topically 2 (two) times daily as needed (rash). To groin and scrotum)   sucroferric oxyhydroxide (VELPHORO) 500 MG chewable tablet Chew 1,000 mg by mouth 3 (three) times daily with meals.   TRESIBA FLEXTOUCH 100 UNIT/ML FlexTouch Pen Inject 22 Units into the skin at bedtime.   amLODipine (NORVASC) 2.5 MG tablet TAKE ONE TABLET BY MOUTH ONCE DAILY. (Patient not taking: Reported on 01/09/2022)   amLODipine (NORVASC) 5 MG tablet Take 2.5 mg by mouth in the morning. (Patient not taking: Reported on 01/09/2022)   cinacalcet (SENSIPAR) 30 MG tablet Take 30 mg by mouth daily with supper. (Patient not taking: Reported on 01/09/2022)   doxycycline (VIBRA-TABS) 100 MG tablet Take 1 tablet (100 mg total) by mouth 2 (two) times daily. (Patient not taking: Reported on 01/09/2022)   No current facility-administered medications for this  visit. (Other)   REVIEW OF SYSTEMS: ROS   Positive for: Genitourinary, Endocrine, Eyes Negative for: Constitutional, Gastrointestinal, Neurological, Skin, Musculoskeletal, HENT, Cardiovascular, Respiratory, Psychiatric, Allergic/Imm, Heme/Lymph Last edited by Theodore Demark, COA on 02/18/2022  3:15 PM.     ALLERGIES Allergies  Allergen Reactions   Tramadol Nausea And Vomiting   PAST MEDICAL HISTORY Past Medical History:  Diagnosis Date   Anemia    Cataract 07/02/2020   Diabetes  mellitus    Type 2    Diabetic retinopathy of both eyes ([place]) 01/31/2019   Dr Coralyn Pear 01/2019   ESRD (end stage renal disease) ([place])    [place] Frenisus   Herpes simplex virus (HSV) infection    OU   Hypertension    Hypertensive retinopathy    OU   Retinal detachment    OS   Sepsis due to Streptococcus, group B ([place]) 10/05/2018   Past Surgical History:  Procedure Laterality Date   25 GAUGE PARS PLANA VITRECTOMY WITH 20 GAUGE MVR PORT FOR MACULAR HOLE Right 02/24/2019   Procedure: 25 GAUGE PARS PLANA VITRECTOMY WITH 20 GAUGE MVR PORT FOR MACULAR HOLE;  Surgeon: Bernarda Caffey, MD;  Location: [facility];  Service: Ophthalmology;  Laterality: Right;   APPLICATION OF WOUND VAC Left 08/27/2018   Procedure: APPLICATION OF WOUND VAC, left neck;  Surgeon: Gaye Pollack, MD;  Location: [facility];  Service: Thoracic;  Laterality: Left;   APPLICATION OF WOUND VAC Left 08/30/2018   Procedure: APPLICATION OF WOUND VAC;  Surgeon: Gaye Pollack, MD;  Location: [facility];  Service: Thoracic;  Laterality: Left;   AV FISTULA PLACEMENT Left 02/15/2020   Procedure: LEFT ARM ARTERIOVENOUS (AV) FISTULA CREATION;  Surgeon: Waynetta Sandy, MD;  Location: [facility];  Service: Vascular;  Laterality: Left;   CATARACT EXTRACTION Bilateral    CATARACT EXTRACTION W/ INTRAOCULAR LENS  IMPLANT, BILATERAL     COLONOSCOPY  06/24/2011   Procedure: COLONOSCOPY;  Surgeon: [name], MD;  [facility];  Service: Endoscopy;  Laterality: N/A;  8:30 AM   EYE SURGERY     GAS/FLUID EXCHANGE Left 03/31/2019   Procedure: Gas/Fluid Exchange;  Surgeon: Bernarda Caffey, MD;  Location: [facility];  Service: Ophthalmology;  Laterality: Left;   INJECTION OF SILICONE OIL  7/47/1595   Procedure: Injection Of Silicone Oil;  Surgeon: Bernarda Caffey, MD;  Location: [facility];  Service: Ophthalmology;;   INSERTION OF DIALYSIS CATHETER Right 12/05/2019   Procedure: INSERTION OF DIALYSIS CATHETER RIGHT SUBCLAVIAN;  Surgeon: Virl Cagey,  MD;  Location[facility];  Service: General;  Laterality: Right;   INSERTION OF DIALYSIS CATHETER Right 12/07/2019   Procedure: Minor Dialysis catheter in place- need to reposition/ adjust catheter to help with flows;  Surgeon: Virl Cagey, MD;  Location: [facility];  Service: General;  Laterality: Right;  procedure room case with 1% lidocaine, full sterile drape,  towels, full gown, large chloraprep, 4-0 Monocryl and dermabond    INSERTION OF DIALYSIS CATHETER Right 12/08/2019   Procedure: INSERTION OF DIALYSIS CATHETER EXCHANGE;  Surgeon: Virl Cagey, MD;  [facility];  Service: General;  Laterality: Right;   IR ANGIOGRAM EXTREMITY RIGHT  03/15/2021   IR RADIOLOGIST EVAL & MGMT  02/07/2021   IR RADIOLOGIST EVAL & MGMT  05/09/2021   IR US GUIDE VASC ACCESS RIGHT  03/15/2021   MEMBRANE PEEL Right 02/24/2019   Procedure: Antoine Primas;  Surgeon: Bernarda Caffey, MD;  Location: [place];  Service: Ophthalmology;  Laterality: Right;   PARS PLANA VITRECTOMY Left 03/31/2019   Procedure: PARS PLANA VITRECTOMY WITH 25 GAUGE WITH MEMBRANE PEEL;  Surgeon: [name], MD;  [facility] OR;  Service: Ophthalmology;  Laterality: Left;   PHOTOCOAGULATION WITH LASER Right 02/24/2019   Procedure: Photocoagulation With Laser;  Surgeon: [name], MD;  Location: [facility] OR;  Service: Ophthalmology;  Laterality: Right;   PHOTOCOAGULATION WITH LASER Left 03/31/2019   Procedure: Photocoagulation With Laser;  Surgeon: [name], MD;  Location: [facility] OR;  Service: Ophthalmology;  Laterality: Left;   RETINAL DETACHMENT SURGERY Left 03/31/2019   TRD Repair - Dr. [name]   SILICON OIL REMOVAL Right 03/31/2019   Procedure: Silicon Oil Removal;  Surgeon: [name], MD;  Location: [facility] OR;  Service: Ophthalmology;  Laterality: Right;   STERNAL WOUND DEBRIDEMENT Left 08/27/2018   Procedure: Incision and DEBRIDEMENT Left Chest, Neck and Mediastinum;  Surgeon: [name], MD;  Location[facility];  Service: Thoracic;   Laterality: Left;   STERNAL WOUND DEBRIDEMENT Left 08/30/2018   Procedure: WOUND VAC CHANGE, LEFT CHEST AND NECK, POSSIBLE DEBRIDEMENT;  Surgeon: [name], MD;  Location: [facility] OR;  Service: Thoracic;  Laterality: Left;   FAMILY HISTORY Family History  Problem Relation Age of Onset   Hypertension Mother    Colon cancer Neg Hx    SOCIAL HISTORY Social History   Tobacco Use   Smoking status: Never   Smokeless tobacco: Never  Vaping Use   Vaping Use: Never used  Substance Use Topics   Alcohol use: No   Drug use: No       OPHTHALMIC EXAM:  Base Eye Exam     Visual Acuity (Snellen - Linear)       Right Left   Dist [place] 20/200 -2 20/200 -2   Dist ph [place] 20/150 +1 20/150         Tonometry (Tonopen, 3:11 PM)       Right Left   Pressure 15 15         Pupils       Dark Light Shape React APD   Right 4 3 Irregular Minimal None   Left 4 3 Irregular Minimal None         Visual Fields (Counting fingers)       Left Right   Restrictions Partial outer superior temporal, inferior temporal, inferior nasal deficiencies Partial outer superior temporal, inferior temporal deficiencies         Extraocular Movement       Right Left    Full, Ortho Full, Ortho         Neuro/Psych     Oriented x3: Yes   Mood/Affect: Normal         Dilation     Both eyes: 1.0% Mydriacyl, 2.5% Phenylephrine @ 3:11 PM           Slit Lamp and Fundus Exam     Slit Lamp Exam       Right Left   Lids/Lashes mild Meibomian gland dysfunction mild Meibomian gland dysfunction   Conjunctiva/Sclera subconj silicon oil bubbles, Chemosis, conj Cyst White and quiet   Cornea Trace PEE, Well healed cataract wound, arcus, Debris in tear film Trace PEE, Well healed cataract wound, arcus, Debris in tear film   Anterior Chamber Deep and quiet, no cell or flare Deep and quiet, no cell or flare   Iris slightly irregular dilation, posterior synchiae from 3:00-1200 round, focal Posterior  synechiae at 1030  Lens PC IOL in good position, pigment deposition, 1+ PCO, mild pigment deposition on optic PC IOL in good position, mild pigment deposition   Anterior Vitreous Post vit, silicon oil bubble ~92% post vitrectomy, good silicone oil fill         Fundus Exam       Right Left   Disc 2+pallor, sharp rim, mild fibrosis along SN rim +pallor, sharp rim, fine NVD--regressed, mild fibrosis   C/D Ratio 0.2 0.2   Macula attached under oil, scattered MA/IRH -- improved, nasal and central thickening / edema -- persistent attached under oil, persistent fibrosis, edema, focal bands of PRF with traction emanating from ST macula -- stable, focal DBH temporal macula--improved   Vessels severe attenuation, Tortuous Attenuated, dilated venules, Tortuous   Periphery Attached, dense 360 PRP laser, mild scattered fibrosis - persistent, Focal ret hole along distal IT arcades--good laser surrounding Attached, good 360 PRP laser changes, pre-retinal fibrosis extending to posterior PRP border superior and inferiorly           IMAGING AND PROCEDURES  Imaging and Procedures for $RemoveBefore'@TODAY'[id number]$ @  OCT, Retina - OU - Both Eyes       Right Eye Quality was good. Central Foveal Thickness: 653. Progression has been stable. Findings include no SRF, abnormal foveal contour, epiretinal membrane, intraretinal fluid, outer retinal atrophy, preretinal fibrosis (Persistent IRF/edema nasal macula -- ?slight improvement).   Left Eye Quality was good. Central Foveal Thickness: 560. Progression has been stable. Findings include no SRF, abnormal foveal contour, intraretinal hyper-reflective material, epiretinal membrane, intraretinal fluid, preretinal fibrosis (Persistent IRF/IRHM/PRF ).   Notes *Images captured and stored on drive  Diagnosis / Impression:  OD: Persistent IRF/edema nasal macula -- ?slight improvement OS: Persistent IRF/IRHM/PRF   Clinical management:  See below  Abbreviations: NFP - Normal foveal  profile. CME - cystoid macular edema. PED - pigment epithelial detachment. IRF - intraretinal fluid. SRF - subretinal fluid. EZ - ellipsoid zone. ERM - epiretinal membrane. ORA - outer retinal atrophy. ORT - outer retinal tubulation. SRHM - subretinal hyper-reflective material      Intravitreal Injection, Pharmacologic Agent - OD - Right Eye       Time Out 02/18/2022. 3:19 PM. Confirmed correct patient, procedure, site, and patient consented.   Anesthesia Topical anesthesia was used. Anesthetic medications included Lidocaine 2%, Proparacaine 0.5%.   Procedure Preparation included 5% betadine to ocular surface, eyelid speculum. A (32g) needle was used.   Injection: 2 mg aflibercept 2 MG/0.05ML   Route: Intravitreal, Site: Right Eye   NDC: A3590391, Lot: [id number], Expiration date: 12/09/2022, Waste: 0 mL   Post-op Post injection exam found visual acuity of at least counting fingers. The patient tolerated the procedure well. There were no complications. The patient received written and verbal post procedure care education. Post injection medications were not given.      Intravitreal Injection, Pharmacologic Agent - OS - Left Eye       Time Out 02/18/2022. 3:20 PM. Confirmed correct patient, procedure, site, and patient consented.   Anesthesia Topical anesthesia was used. Anesthetic medications included Lidocaine 2%, Proparacaine 0.5%.   Procedure Preparation included 5% betadine to ocular surface, eyelid speculum. A (32g) needle was used.   Injection: 2 mg aflibercept 2 MG/0.05ML   Route: Intravitreal, Site: Left Eye   NDC: A3590391, Lot: [id number], Expiration date: 11/09/2022, Waste: 0 mL   Post-op Post injection exam found visual acuity of at least counting fingers. The patient tolerated the procedure well. There were no complications. The  patient received written and verbal post procedure care education. Post injection medications were not given.             ASSESSMENT/PLAN:    ICD-10-CM   1. Proliferative diabetic retinopathy of both eyes with macular edema associated with type 2 diabetes mellitus (HCC)  E11.3513 OCT, Retina - OU - Both Eyes    Intravitreal Injection, Pharmacologic Agent - OD - Right Eye    Intravitreal Injection, Pharmacologic Agent - OS - Left Eye    aflibercept (EYLEA) SOLN 2 mg    aflibercept (EYLEA) SOLN 2 mg    2. Retinal detachment, tractional, bilateral  H33.43     3. Vitreous hemorrhage of both eyes ([place])  H43.13     4. Essential hypertension  I10     5. Hypertensive retinopathy of both eyes  H35.033     6. Pseudophakia of both eyes  Z96.1       1-3. Proliferative diabetic retinopathy with DME, TRD, and vitreous hemorrhage OU (OD > OS)  - last A1c: 9.6 on 11.11.22  - lost to f/u from Jan 2021 to Oct 2021 due to loss of insurance coverage  - pt with complex medical history with hospitalization in Jan-Feb 2020 for bacteremia and abscess  - history of poor glycemic control for years  - s/p IVA OD #1 (06.20.20), #2 (07.01.20), #3 (09.11.20), #4 (10.09.20), #5 (11.06.20), #6 (11.24.21), #7 (12.22.21), #8 (01.19.22)  - s/p IVA OS #1 (06.20.20), #2 (07.01.20), #3 (08.13.20), #4 (09.11.20)  #5 (11.06.20), #6 (11.24.21), #7 (12.22.21), #8 (01.19.22)  - s/p IVE OU #1 (12.04.20) - sample; #2 (01.06.21), #3 (02.18.22), #4 (03.18.22), #5 (04.18.22), #6 (05.25.22), #7 (07.01.22), #8 (07.29.22), #9 (09.02.22), #10 (09.30.22), #11 (11.04.22), #12 (12.09.23), #13 (01.13.23), #14 (03.02.23), #15 (04.11.23), #16 (06.01.23)  - s/p STK OS #1 (10.09.20)  - s/p PRP OS (06.10.20)  - s/p PPV/PFC/EL/FAX/silicone oil OD, 40.98.11  - s/p subconj silicon oil removal OD 8.20.20  - s/p PPV/EL/FAX/silicone oil OS, 91.47.82  - BCVA 20/150 OU (stable)             - OCT: OD: Persistent IRF/edema nasal macula -- ?slight improvement; OS: Persistent IRF/IRHM/PRF at 6 weeks  - recommend IVE OU #17 for DME today, 07.11.23 with f/u at 8 weeks  -  discussed possible switch to Vabysmo at next visit  - RBA of procedure discussed, questions answered  - informed consent obtained and re-signed 06.01.23  - see procedure note             - Eylea benefits investigation begun 01.19.22 -- approved for 2023  - discussed possible need for PPV w/ membrane peel and silicon oil exchange OS due to persistent fibrosis  - cont topical PF and Prolensa QID OU for possible CME component  - f/u 8 weeks -- DFE/OCT, possible injection  4,5. Hypertensive retinopathy OU  - discussed importance of tight BP control  - monitor  6. Pseudophakia OU  - s/p CE with IOL (Dr. Gershon Crane)  Ophthalmic Meds Ordered this visit:  Meds ordered this encounter  Medications   aflibercept (EYLEA) SOLN 2 mg   aflibercept (EYLEA) SOLN 2 mg     Return in about 8 weeks (around 04/15/2022) for f/u PDR OU, DFE, OCT.  There are no Patient Instructions on file for this visit.  This document serves as a record of services personally performed by Gardiner Sleeper, MD, PhD. It was created on their behalf by Renaldo Reel, [place] an ophthalmic technician. The  creation of this record is the provider's dictation and/or activities during the visit.    Electronically signed by:  Renaldo Reel, COT  02/10/22 4:35 PM  This document serves as a record of services personally performed by Gardiner Sleeper, MD, PhD. It was created on their behalf by [place]. Owens Shark, OA an ophthalmic technician. The creation of this record is the provider's dictation and/or activities during the visit.    Electronically signed by: San Jetty. Marguerita Merles 07.11.2023 4:35 PM  Gardiner Sleeper, M.D., Ph.D. Diseases & Surgery of the Retina and Vitreous [facility]  I have reviewed the above documentation for accuracy and completeness, and I agree with the above. Gardiner Sleeper, M.D., Ph.D. 02/18/22 4:37 PM   Abbreviations: M myopia (nearsighted); A astigmatism; H hyperopia  (farsighted); P presbyopia; Mrx spectacle prescription;  CTL contact lenses; OD right eye; OS left eye; OU both eyes  XT exotropia; ET esotropia; PEK punctate epithelial keratitis; PEE punctate epithelial erosions; DES dry eye syndrome; MGD meibomian gland dysfunction; ATs artificial tears; PFAT's preservative free artificial tears; [place] nuclear sclerotic cataract; PSC posterior subcapsular cataract; ERM epi-retinal membrane; PVD posterior vitreous detachment; RD retinal detachment; DM diabetes mellitus; DR diabetic retinopathy; NPDR non-proliferative diabetic retinopathy; PDR proliferative diabetic retinopathy; CSME clinically significant macular edema; DME diabetic macular edema; dbh dot blot hemorrhages; CWS cotton wool spot; POAG primary open angle glaucoma; C/D cup-to-disc ratio; HVF humphrey visual field; GVF goldmann visual field; OCT optical coherence tomography; IOP intraocular pressure; BRVO Branch retinal vein occlusion; CRVO central retinal vein occlusion; CRAO central retinal artery occlusion; BRAO branch retinal artery occlusion; RT retinal tear; SB scleral buckle; PPV pars plana vitrectomy; VH Vitreous hemorrhage; PRP panretinal laser photocoagulation; IVK intravitreal kenalog; VMT vitreomacular traction; MH Macular hole;  NVD neovascularization of the disc; NVE neovascularization elsewhere; AREDS age related eye disease study; ARMD age related macular degeneration; POAG primary open angle glaucoma; EBMD epithelial/anterior basement membrane dystrophy; ACIOL anterior chamber intraocular lens; IOL intraocular lens; PCIOL posterior chamber intraocular lens; Phaco/IOL phacoemulsification with intraocular lens placement; [place] photorefractive keratectomy; LASIK laser assisted in situ keratomileusis; HTN hypertension; DM diabetes mellitus; COPD chronic obstructive pulmonary disease

## 2022-02-18 ENCOUNTER — Ambulatory Visit (INDEPENDENT_AMBULATORY_CARE_PROVIDER_SITE_OTHER): Payer: Medicare Other | Admitting: Ophthalmology

## 2022-02-18 ENCOUNTER — Encounter (INDEPENDENT_AMBULATORY_CARE_PROVIDER_SITE_OTHER): Payer: Self-pay | Admitting: Ophthalmology

## 2022-02-18 DIAGNOSIS — H35033 Hypertensive retinopathy, bilateral: Secondary | ICD-10-CM

## 2022-02-18 DIAGNOSIS — E113513 Type 2 diabetes mellitus with proliferative diabetic retinopathy with macular edema, bilateral: Secondary | ICD-10-CM | POA: Diagnosis not present

## 2022-02-18 DIAGNOSIS — H4313 Vitreous hemorrhage, bilateral: Secondary | ICD-10-CM

## 2022-02-18 DIAGNOSIS — H3343 Traction detachment of retina, bilateral: Secondary | ICD-10-CM | POA: Diagnosis not present

## 2022-02-18 DIAGNOSIS — Z961 Presence of intraocular lens: Secondary | ICD-10-CM

## 2022-02-18 DIAGNOSIS — I10 Essential (primary) hypertension: Secondary | ICD-10-CM

## 2022-02-18 MED ORDER — AFLIBERCEPT 2MG/0.05ML IZ SOLN FOR KALEIDOSCOPE
2.0000 mg | INTRAVITREAL | Status: AC | PRN
  Administered 2022-02-18: 2 mg via INTRAVITREAL

## 2022-02-19 NOTE — Progress Notes (Signed)
02/19/22- pt returned call and states he missed his last appt with endo but is going to call and set up appt with endo. Pt did schedule appt with PCP for 03/18/22. Pt states dialysis is monitoring A1C; all meds are the same. Pt states he is working on getting his A1C down.

## 2022-02-19 NOTE — Progress Notes (Signed)
Left message to return call 

## 2022-03-18 ENCOUNTER — Ambulatory Visit (INDEPENDENT_AMBULATORY_CARE_PROVIDER_SITE_OTHER): Payer: Medicare Other | Admitting: Family Medicine

## 2022-03-18 VITALS — BP 148/92 | HR 88 | Temp 99.1°F | Ht 71.0 in | Wt 203.0 lb

## 2022-03-18 DIAGNOSIS — E119 Type 2 diabetes mellitus without complications: Secondary | ICD-10-CM | POA: Diagnosis not present

## 2022-03-18 DIAGNOSIS — N186 End stage renal disease: Secondary | ICD-10-CM

## 2022-03-18 DIAGNOSIS — Z794 Long term (current) use of insulin: Secondary | ICD-10-CM

## 2022-03-18 DIAGNOSIS — Z125 Encounter for screening for malignant neoplasm of prostate: Secondary | ICD-10-CM

## 2022-03-18 DIAGNOSIS — E1140 Type 2 diabetes mellitus with diabetic neuropathy, unspecified: Secondary | ICD-10-CM

## 2022-03-18 DIAGNOSIS — I10 Essential (primary) hypertension: Secondary | ICD-10-CM

## 2022-03-18 DIAGNOSIS — E1169 Type 2 diabetes mellitus with other specified complication: Secondary | ICD-10-CM | POA: Diagnosis not present

## 2022-03-18 DIAGNOSIS — E785 Hyperlipidemia, unspecified: Secondary | ICD-10-CM

## 2022-03-18 NOTE — Patient Instructions (Signed)

## 2022-03-18 NOTE — Progress Notes (Signed)
   Subjective:    Patient ID: Randy Oneal, male    DOB: 1965-11-01, 56 y.o.   MRN: [id number]  HPI Right foot lg toe problem will see [facility] at upcoming appt Waiting on kidney and pancreas translplant Dialysis mon, wed, fri at [place]  I had a good discussion with the patient regarding his situation.  He is pacing dialysis.  He does have some underlying health issues.  He also has some healthcare deficiencies that need to be carried out lessen the risk of other issues that would come up relating to his health that potentially could get in the way of doing transplant Review of Systems Patient is followed by endocrinology for his diabetes    Objective:   Physical Exam Patient has some mild pedal edema bilateral Lungs clear heart regular pulse normal   I have advised the patient to get Tdap and shingles vaccine through his pharmacy    Assessment & Plan:   End-stage kidney disease-followed by specialist Gets dialysis 3 times per week Facing transplant of potential kidneys and pancreas  Recommend shingles vaccine ASAP  Hyperlipidemia check lipid profile  Prostate cancer screening recommend PSA  Patient also due for colonoscopy we will touch base with his transplant team to see if they recommend colonoscopy  He also has a wound on his foot that will need further healing he will see a wound specialist later this week

## 2022-03-27 ENCOUNTER — Telehealth: Payer: Self-pay | Admitting: Family Medicine

## 2022-03-27 NOTE — Telephone Encounter (Signed)
Nurses Patient was recently evaluated at [facility] for a foot wound infection His A1c was 9.6-please find out from the patient and/or his wife is currently going to see a endocrinologist regarding his diabetes?

## 2022-03-28 NOTE — Telephone Encounter (Signed)
Left message for patient to return the call for additional details and recommendations.   

## 2022-04-01 NOTE — Telephone Encounter (Signed)
With his underlying condition and is very important for him to follow-up there on a regular basis

## 2022-04-01 NOTE — Telephone Encounter (Signed)
Pt contacted. Pt states he is going to start seeing Dr.Shamleffer.

## 2022-04-02 ENCOUNTER — Telehealth: Payer: Self-pay

## 2022-04-02 MED ORDER — AMLODIPINE BESYLATE 2.5 MG PO TABS: 2.5000 mg | ORAL_TABLET | Freq: Every day | ORAL | 1 refills | Status: DC

## 2022-04-02 MED ORDER — BLOOD GLUCOSE METER KIT: PACK | 0 refills | Status: DC

## 2022-04-02 NOTE — Telephone Encounter (Signed)
Pt is aware.  

## 2022-04-02 NOTE — Telephone Encounter (Signed)
Patient calls to request glucometer and supplies as well as to discuss BP . He has been having elevated readings 170/78 prior to dialysis and readings of  110/70 after dialysis . He has not been taking any amlodipine at all and would like recommendations. Please advise

## 2022-04-02 NOTE — Telephone Encounter (Signed)
Pt contacted and states he is taking 2.5 mg Amlodipine. Pt advised to take only on non-dialysis days. Pt verbalized understanding. Pt checking sugars 2-3 times a day. Script for glucometer and supplies sent to [employer]

## 2022-04-02 NOTE — Telephone Encounter (Signed)
1.  Please verify with patient what dose of amlodipine does he have currently-2.5 mg or 5 mg #2 I would recommend amlodipine on the his nondialysis days And on his dialysis days do not take the amlodipine #3 the next time he sees his kidney doctor he should try to clarify their approach regarding this  May have glucometer and strips as well, the next time he sees his endocrinologist more than likely the endocrinologist could get him set up on a freestyle libre-continuous glucose monitoring

## 2022-04-04 ENCOUNTER — Other Ambulatory Visit (INDEPENDENT_AMBULATORY_CARE_PROVIDER_SITE_OTHER): Payer: Self-pay | Admitting: Ophthalmology

## 2022-04-06 ENCOUNTER — Telehealth: Payer: Self-pay | Admitting: Family Medicine

## 2022-04-06 NOTE — Telephone Encounter (Signed)
Nurses Please touch base with patient Find out who his transplant coordinator at [facility] is and their phone number  (We are trying to connect with the transplant coordinator to see if they recommend him having his colonoscopy in the near future?)

## 2022-04-07 NOTE — Telephone Encounter (Signed)
Informed patient per drs request, pt will call back with information.

## 2022-04-10 NOTE — Progress Notes (Signed)
[facility] Note  04/22/2022    CHIEF COMPLAINT Patient presents for Retina Follow Up  HISTORY OF PRESENT ILLNESS: Randy Oneal is a 56 y.o. male who presents to the clinic today for:  HPI     Retina Follow Up   Patient presents with  Diabetic Retinopathy.  In both eyes.  This started years ago.  Duration of 8 weeks.  Since onset it is stable.  I, the attending physician,  performed the HPI with the patient and updated documentation appropriately.        Comments   Patient feels that the vision is the same. His blood sugar was 141 and his A1C is 8.9      Last edited by Bernarda Caffey, MD on 04/22/2022  3:32 PM.    Pt states vision is stable  Referring physician: [name], MD [address],  [place] [id number]  HISTORICAL INFORMATION:   Selected notes from the MEDICAL RECORD NUMBER Referred by Dr.Mark Gershon Crane for concern of decreased vision post cataract sx   CURRENT MEDICATIONS: Current Outpatient Medications (Ophthalmic Drugs)  Medication Sig   Bromfenac Sodium (PROLENSA) 0.07 % SOLN Place 1 drop into both eyes 4 (four) times daily.   prednisoLONE acetate (PRED FORTE) 1 % ophthalmic suspension PLACE (1) DROP INTO BOTH EYES FOUR TIMES DAILY   No current facility-administered medications for this visit. (Ophthalmic Drugs)   Current Outpatient Medications (Other)  Medication Sig   acetaminophen (TYLENOL) 500 MG tablet Take 1,000 mg by mouth every 6 (six) hours as needed for moderate pain or headache.   amLODipine (NORVASC) 2.5 MG tablet Take 1 tablet (2.5 mg total) by mouth daily.   amLODipine (NORVASC) 5 MG tablet Take 2.5 mg by mouth in the morning. (Patient not taking: Reported on 01/09/2022)   blood glucose meter kit and supplies Use to check sugars 3 times daily   cinacalcet (SENSIPAR) 30 MG tablet Take 30 mg by mouth daily with supper.   Continuous Blood Gluc Sensor (DEXCOM G6 SENSOR) MISC 1 Device by Does not  apply route as directed.   Continuous Blood Gluc Transmit (DEXCOM G6 TRANSMITTER) MISC 1 Device by Does not apply route as directed.   glucose blood (ONETOUCH ULTRA) test strip 1 each by Other route in the morning, at noon, in the evening, and at bedtime. Use as instructed   insulin aspart (NOVOLOG FLEXPEN) 100 UNIT/ML FlexPen Inject 7 Units into the skin 3 (three) times daily with meals.   Insulin Pen Needle 32G X 4 MM MISC 1 Device by Does not apply route in the morning, at noon, in the evening, and at bedtime.   lidocaine-prilocaine (EMLA) cream Apply 1 application topically Every Tuesday,Thursday,and Saturday with dialysis.   MICROLET LANCETS MISC USE FOUR TIMES A DAY AS DIRECTED.   nystatin (MYCOSTATIN/NYSTOP) powder Apply topically 2 (two) times daily. To groin and scrotum (Patient not taking: Reported on 03/18/2022)   sucroferric oxyhydroxide (VELPHORO) 500 MG chewable tablet Chew 1,000 mg by mouth 3 (three) times daily with meals.   TRESIBA FLEXTOUCH 100 UNIT/ML FlexTouch Pen Inject 22 Units into the skin at bedtime.   No current facility-administered medications for this visit. (Other)   REVIEW OF SYSTEMS: ROS   Positive for: Genitourinary, Endocrine, Eyes Negative for: Constitutional, Gastrointestinal, Neurological, Skin, Musculoskeletal, HENT, Cardiovascular, Respiratory, Psychiatric, Allergic/Imm, Heme/Lymph Last edited by Annie Paras, COT on 04/22/2022  1:55 PM.      ALLERGIES Allergies  Allergen Reactions   Tramadol Nausea And Vomiting   PAST MEDICAL HISTORY Past Medical History:  Diagnosis Date   Anemia    Cataract 07/02/2020   Diabetes mellitus    Type 2    Diabetic retinopathy of both eyes ([place]) 01/31/2019   Dr Coralyn Pear 01/2019   ESRD (end stage renal disease) ([place])    [place] Frenisus   Herpes simplex virus (HSV) infection    OU   Hypertension    Hypertensive retinopathy    OU   Retinal detachment    OS   Sepsis due to Streptococcus, group B ([place])  10/05/2018   Past Surgical History:  Procedure Laterality Date   25 GAUGE PARS PLANA VITRECTOMY WITH 20 GAUGE MVR PORT FOR MACULAR HOLE Right 02/24/2019   Procedure: 25 GAUGE PARS PLANA VITRECTOMY WITH 20 GAUGE MVR PORT FOR MACULAR HOLE;  Surgeon: Bernarda Caffey, MD;  Location: [facility];  Service: Ophthalmology;  Laterality: Right;   APPLICATION OF WOUND VAC Left 08/27/2018   Procedure: APPLICATION OF WOUND VAC, left neck;  Surgeon: Gaye Pollack, MD;  Location: [place] [facility];  Service: Thoracic;  Laterality: Left;   APPLICATION OF WOUND VAC Left 08/30/2018   Procedure: APPLICATION OF WOUND VAC;  Surgeon: Gaye Pollack, MD;  Location: [place];  Service: Thoracic;  Laterality: Left;   AV FISTULA PLACEMENT Left 02/15/2020   Procedure: LEFT ARM ARTERIOVENOUS (AV) FISTULA CREATION;  Surgeon: Waynetta Sandy, MD;  Location: [place] [facility];  Service: Vascular;  Laterality: Left;   CATARACT EXTRACTION Bilateral    CATARACT EXTRACTION W/ INTRAOCULAR LENS  IMPLANT, BILATERAL     COLONOSCOPY  06/24/2011   Procedure: COLONOSCOPY;  Surgeon: [name], MD;  [facility];  Service: Endoscopy;  Laterality: N/A;  8:30 AM   EYE SURGERY     GAS/FLUID EXCHANGE Left 03/31/2019   Procedure: Gas/Fluid Exchange;  Surgeon: Bernarda Caffey, MD;  Location: [place];  Service: Ophthalmology;  Laterality: Left;   INJECTION OF SILICONE OIL  1/61/0960   Procedure: Injection Of Silicone Oil;  Surgeon: Bernarda Caffey, MD;  Location: [place];  Service: Ophthalmology;;   INSERTION OF DIALYSIS CATHETER Right 12/05/2019   Procedure: INSERTION OF DIALYSIS CATHETER RIGHT SUBCLAVIAN;  Surgeon: Virl Cagey, MD;  [facility]: [facility];  Service: General;  Laterality: Right;   INSERTION OF DIALYSIS CATHETER Right 12/07/2019   Procedure: Minor Dialysis catheter in place- need to reposition/ adjust catheter to help with flows;  Surgeon: Virl Cagey, MD;  Location: [facility];  Service: General;  Laterality: Right;  procedure room case with  1% lidocaine, full sterile drape,  towels, full gown, large chloraprep, 4-0 Monocryl and dermabond    INSERTION OF DIALYSIS CATHETER Right 12/08/2019   Procedure: INSERTION OF DIALYSIS CATHETER EXCHANGE;  Surgeon: Virl Cagey, MD;  [facility]: [facility];  Service: General;  Laterality: Right;   IR ANGIOGRAM EXTREMITY RIGHT  03/15/2021   IR RADIOLOGIST EVAL & MGMT  02/07/2021   IR RADIOLOGIST EVAL & MGMT  05/09/2021   IR US GUIDE VASC ACCESS RIGHT  03/15/2021   MEMBRANE PEEL Right 02/24/2019   Procedure: Antoine Primas;  Surgeon: Bernarda Caffey, MD;  Location: [facility];  Service: Ophthalmology;  Laterality: Right;   PARS PLANA VITRECTOMY Left 03/31/2019   Procedure: PARS PLANA VITRECTOMY WITH 25 GAUGE WITH MEMBRANE PEEL;  Surgeon: Bernarda Caffey, MD;  Location: [facility] [place];  Service: Ophthalmology;  Laterality: Left;   PHOTOCOAGULATION WITH LASER Right 02/24/2019   Procedure: Photocoagulation  With Laser;  Surgeon: Bernarda Caffey, MD;  Location: [place];  Service: Ophthalmology;  Laterality: Right;   PHOTOCOAGULATION WITH LASER Left 03/31/2019   Procedure: Photocoagulation With Laser;  Surgeon: Bernarda Caffey, MD;  Location: [facility];  Service: Ophthalmology;  Laterality: Left;   RETINAL DETACHMENT SURGERY Left 03/31/2019   TRD Repair - Dr. Bernarda Caffey   SILICON OIL REMOVAL Right 4/84/0397   Procedure: Silicon Oil Removal;  Surgeon: Bernarda Caffey, MD;  Location: [facility] [facility];  Service: Ophthalmology;  Laterality: Right;   STERNAL WOUND DEBRIDEMENT Left 08/27/2018   Procedure: Incision and DEBRIDEMENT Left Chest, Neck and Mediastinum;  Surgeon: Gaye Pollack, MD;  [facility];  Service: Thoracic;  Laterality: Left;   STERNAL WOUND DEBRIDEMENT Left 08/30/2018   Procedure: WOUND VAC CHANGE, LEFT CHEST AND NECK, POSSIBLE DEBRIDEMENT;  Surgeon: Gaye Pollack, MD;  [facility];  Service: Thoracic;  Laterality: Left;   FAMILY HISTORY Family History  Problem Relation Age of Onset   Hypertension Mother    Colon cancer Neg  Hx    SOCIAL HISTORY Social History   Tobacco Use   Smoking status: Never   Smokeless tobacco: Never  Vaping Use   Vaping Use: Never used  Substance Use Topics   Alcohol use: No   Drug use: No       OPHTHALMIC EXAM:  Base Eye Exam     Visual Acuity (Snellen - Linear)       Right Left   Dist [place] 20/150 +2 20/200 +1   Dist ph [place] 20/100 20/150         Tonometry (Tonopen, 2:01 PM)       Right Left   Pressure 11 12         Pupils       Dark Light Shape React APD   Right 4 3 Irregular Minimal None   Left 4 3 Irregular Minimal None         Visual Fields       Left Right   Restrictions Partial outer superior temporal, inferior temporal, inferior nasal deficiencies Partial outer superior temporal, inferior temporal deficiencies         Extraocular Movement       Right Left    Full, Ortho Full, Ortho         Neuro/Psych     Oriented x3: Yes   Mood/Affect: Normal         Dilation     Both eyes: 1.0% Mydriacyl, 2.5% Phenylephrine @ 1:55 PM           Slit Lamp and Fundus Exam     Slit Lamp Exam       Right Left   Lids/Lashes mild Meibomian gland dysfunction mild Meibomian gland dysfunction   Conjunctiva/Sclera subconj silicon oil bubbles, Chemosis, conj Cyst White and quiet   Cornea Trace PEE, Well healed cataract wound, arcus, Debris in tear film Trace PEE, Well healed cataract wound, arcus, Debris in tear film   Anterior Chamber Deep and quiet, no cell or flare Deep and quiet, no cell or flare   Iris slightly irregular dilation, posterior synchiae from 3:00-1200 round, focal Posterior synechiae at 1030   Lens PC IOL in good position, pigment deposition, 1+ PCO, mild pigment deposition on optic PC IOL in good position, mild pigment deposition   Anterior Vitreous Post vit, silicon oil bubble ~95% post vitrectomy, good silicone oil fill         Fundus Exam  Right Left   Disc 2+pallor, sharp rim, mild fibrosis along SN rim  +pallor, sharp rim, fine NVD--regressed, mild fibrosis   C/D Ratio 0.2 0.2   Macula attached under oil, scattered MA/IRH -- improved, nasal and central thickening / edema -- persistent attached under oil, persistent fibrosis, edema, focal bands of PRF with traction emanating from ST macula -- stable, focal DBH temporal macula--improved   Vessels severe attenuation, Tortuous Attenuated, dilated venules, Tortuous   Periphery Attached, dense 360 PRP laser, mild scattered fibrosis - persistent, Focal ret hole along distal IT arcades--good laser surrounding Attached, good 360 PRP laser changes, pre-retinal fibrosis extending to posterior PRP border superior and inferiorly           IMAGING AND PROCEDURES  Imaging and Procedures for _0 @  OCT, Retina - OU - Both Eyes       Right Eye Quality was good. Central Foveal Thickness: 660. Progression has been stable. Findings include no SRF, abnormal foveal contour, epiretinal membrane, intraretinal fluid, outer retinal atrophy, preretinal fibrosis (Persistent IRF/edema nasal macula ).   Left Eye Quality was good. Central Foveal Thickness: 565. Progression has been stable. Findings include no SRF, abnormal foveal contour, intraretinal hyper-reflective material, epiretinal membrane, intraretinal fluid, preretinal fibrosis (Persistent IRF/IRHM/PRF ).   Notes *Images captured and stored on drive  Diagnosis / Impression:  +DME OU OD: Persistent IRF/edema nasal macula  OS: Persistent IRF/IRHM/PRF   Clinical management:  See below  Abbreviations: NFP - Normal foveal profile. CME - cystoid macular edema. PED - pigment epithelial detachment. IRF - intraretinal fluid. SRF - subretinal fluid. EZ - ellipsoid zone. ERM - epiretinal membrane. ORA - outer retinal atrophy. ORT - outer retinal tubulation. SRHM - subretinal hyper-reflective material      Intravitreal Injection, Pharmacologic Agent - OD - Right Eye       Time Out 04/22/2022. 2:41 PM.  Confirmed correct patient, procedure, site, and patient consented.   Anesthesia Topical anesthesia was used. Anesthetic medications included Lidocaine 2%, Proparacaine 0.5%.   Procedure Preparation included 5% betadine to ocular surface, eyelid speculum. A (32g) needle was used.   Injection: 2 mg aflibercept 2 MG/0.05ML   Route: Intravitreal, Site: Right Eye   NDC: A3590391, Lot: [id number], Expiration date: 06/11/2023, Waste: 0 mL   Post-op Post injection exam found visual acuity of at least counting fingers. The patient tolerated the procedure well. There were no complications. The patient received written and verbal post procedure care education. Post injection medications were not given.      Intravitreal Injection, Pharmacologic Agent - OS - Left Eye       Time Out 04/22/2022. 2:45 PM. Confirmed correct patient, procedure, site, and patient consented.   Procedure Injection: 2 mg aflibercept 2 MG/0.05ML   Route: Intravitreal, Site: Left Eye   NDC: A3590391, Lot: [id number], Expiration date: 05/11/2023, Waste: 0 mL   Post-op Post injection exam found visual acuity of at least counting fingers. The patient tolerated the procedure well. There were no complications. The patient received written and verbal post procedure care education. Post injection medications were not given.            ASSESSMENT/PLAN:    ICD-10-CM   1. Proliferative diabetic retinopathy of both eyes with macular edema associated with type 2 diabetes mellitus (HCC)  E11.3513 OCT, Retina - OU - Both Eyes    Intravitreal Injection, Pharmacologic Agent - OD - Right Eye    Intravitreal Injection, Pharmacologic Agent - OS - Left Eye  aflibercept (EYLEA) SOLN 2 mg    aflibercept (EYLEA) SOLN 2 mg    2. Retinal detachment, tractional, bilateral  H33.43     3. Vitreous hemorrhage of both eyes ([place])  H43.13     4. Essential hypertension  I10     5. Hypertensive retinopathy of both eyes  H35.033      6. Pseudophakia of both eyes  Z96.1      1-3. Proliferative diabetic retinopathy with DME, TRD, and vitreous hemorrhage OU (OD > OS)  - last A1c: 9.6 on 11.11.22  - lost to f/u from Jan 2021 to Oct 2021 due to loss of insurance coverage  - pt with complex medical history with hospitalization in Jan-Feb 2020 for bacteremia and abscess  - history of poor glycemic control for years  - s/p IVA OD #1 (06.20.20), #2 (07.01.20), #3 (09.11.20), #4 (10.09.20), #5 (11.06.20), #6 (11.24.21), #7 (12.22.21), #8 (01.19.22)  - s/p IVA OS #1 (06.20.20), #2 (07.01.20), #3 (08.13.20), #4 (09.11.20)  #5 (11.06.20), #6 (11.24.21), #7 (12.22.21), #8 (01.19.22)  - s/p IVE OU #1 (12.04.20) - sample; #2 (01.06.21), #3 (02.18.22), #4 (03.18.22), #5 (04.18.22), #6 (05.25.22), #7 (07.01.22), #8 (07.29.22), #9 (09.02.22), #10 (09.30.22), #11 (11.04.22), #12 (12.09.23), #13 (01.13.23), #14 (03.02.23), #15 (04.11.23), #16 (06.01.23), #17 (07.11.23)  - s/p STK OS #1 (10.09.20)  - s/p PRP OS (06.10.20)  - s/p PPV/PFC/EL/FAX/silicone oil OD, 48.01.65  - s/p subconj silicon oil removal OD 8.20.20  - s/p PPV/EL/FAX/silicone oil OS, 53.74.82  - BCVA OD: 20/100 (improved), 20/150 OS (stable)             - OCT: OD: Persistent IRF/edema nasal macula; OS: Persistent IRF/IRHM/PRF at 8 weeks  - recommend IVE OU #18 for DME today, 09.12.23 with f/u in 9 weeks  - discussed possible switch to Vabysmo at next visit  - RBA of procedure discussed, questions answered  - informed consent obtained and re-signed 06.01.23  - see procedure note             - Eylea benefits investigation begun 01.19.22 -- approved for 2023  - cont topical PF and Prolensa QID OU for possible CME component  - f/u 9 weeks -- DFE/OCT, possible injection  4,5. Hypertensive retinopathy OU  - discussed importance of tight BP control  - monitor  6. Pseudophakia OU  - s/p CE with IOL (Dr. Gershon Crane)  Ophthalmic Meds Ordered this visit:  Meds ordered this  encounter  Medications   aflibercept (EYLEA) SOLN 2 mg   aflibercept (EYLEA) SOLN 2 mg     Return in about 9 weeks (around 06/24/2022) for f/u PDR OU, DFE, OCT.  There are no Patient Instructions on file for this visit.  This document serves as a record of services personally performed by Gardiner Sleeper, MD, PhD. It was created on their behalf by Renaldo Reel, [place] an ophthalmic technician. The creation of this record is the provider's dictation and/or activities during the visit.    Electronically signed by:  Renaldo Reel, COT  04/10/22 3:34 PM  This document serves as a record of services personally performed by Gardiner Sleeper, MD, PhD. It was created on their behalf by San Jetty. Owens Shark, OA an ophthalmic technician. The creation of this record is the provider's dictation and/or activities during the visit.    Electronically signed by: San Jetty. Marguerita Merles 09.12.2023 3:34 PM  Gardiner Sleeper, M.D., Ph.D. Diseases & Surgery of the Retina and Vitreous [facility]  I have reviewed the above documentation for accuracy and completeness, and I agree with the above. Gardiner Sleeper, M.D., Ph.D. 04/22/22 3:35 PM   Abbreviations: M myopia (nearsighted); A astigmatism; H hyperopia (farsighted); P presbyopia; Mrx spectacle prescription;  CTL contact lenses; OD right eye; OS left eye; OU both eyes  XT exotropia; ET esotropia; PEK punctate epithelial keratitis; PEE punctate epithelial erosions; DES dry eye syndrome; MGD meibomian gland dysfunction; ATs artificial tears; PFAT's preservative free artificial tears; [place] nuclear sclerotic cataract; PSC posterior subcapsular cataract; ERM epi-retinal membrane; PVD posterior vitreous detachment; RD retinal detachment; DM diabetes mellitus; DR diabetic retinopathy; NPDR non-proliferative diabetic retinopathy; PDR proliferative diabetic retinopathy; CSME clinically significant macular edema; DME diabetic macular edema; dbh dot  blot hemorrhages; CWS cotton wool spot; POAG primary open angle glaucoma; C/D cup-to-disc ratio; HVF humphrey visual field; GVF goldmann visual field; OCT optical coherence tomography; IOP intraocular pressure; BRVO Branch retinal vein occlusion; CRVO central retinal vein occlusion; CRAO central retinal artery occlusion; BRAO branch retinal artery occlusion; RT retinal tear; SB scleral buckle; PPV pars plana vitrectomy; VH Vitreous hemorrhage; PRP panretinal laser photocoagulation; IVK intravitreal kenalog; VMT vitreomacular traction; MH Macular hole;  NVD neovascularization of the disc; NVE neovascularization elsewhere; AREDS age related eye disease study; ARMD age related macular degeneration; POAG primary open angle glaucoma; EBMD epithelial/anterior basement membrane dystrophy; ACIOL anterior chamber intraocular lens; IOL intraocular lens; PCIOL posterior chamber intraocular lens; Phaco/IOL phacoemulsification with intraocular lens placement; [place] photorefractive keratectomy; LASIK laser assisted in situ keratomileusis; HTN hypertension; DM diabetes mellitus; COPD chronic obstructive pulmonary disease

## 2022-04-15 ENCOUNTER — Encounter (INDEPENDENT_AMBULATORY_CARE_PROVIDER_SITE_OTHER): Payer: Medicare Other | Admitting: Ophthalmology

## 2022-04-15 DIAGNOSIS — Z961 Presence of intraocular lens: Secondary | ICD-10-CM

## 2022-04-15 DIAGNOSIS — E113513 Type 2 diabetes mellitus with proliferative diabetic retinopathy with macular edema, bilateral: Secondary | ICD-10-CM

## 2022-04-15 DIAGNOSIS — H35033 Hypertensive retinopathy, bilateral: Secondary | ICD-10-CM

## 2022-04-15 DIAGNOSIS — H4313 Vitreous hemorrhage, bilateral: Secondary | ICD-10-CM

## 2022-04-15 DIAGNOSIS — H3343 Traction detachment of retina, bilateral: Secondary | ICD-10-CM

## 2022-04-15 DIAGNOSIS — I10 Essential (primary) hypertension: Secondary | ICD-10-CM

## 2022-04-17 DIAGNOSIS — E08621 Diabetes mellitus due to underlying condition with foot ulcer: Secondary | ICD-10-CM | POA: Insufficient documentation

## 2022-04-22 ENCOUNTER — Encounter (INDEPENDENT_AMBULATORY_CARE_PROVIDER_SITE_OTHER): Payer: Self-pay | Admitting: Ophthalmology

## 2022-04-22 ENCOUNTER — Ambulatory Visit (INDEPENDENT_AMBULATORY_CARE_PROVIDER_SITE_OTHER): Payer: Medicare Other | Admitting: Ophthalmology

## 2022-04-22 DIAGNOSIS — H4313 Vitreous hemorrhage, bilateral: Secondary | ICD-10-CM

## 2022-04-22 DIAGNOSIS — H3343 Traction detachment of retina, bilateral: Secondary | ICD-10-CM

## 2022-04-22 DIAGNOSIS — E113513 Type 2 diabetes mellitus with proliferative diabetic retinopathy with macular edema, bilateral: Secondary | ICD-10-CM | POA: Diagnosis not present

## 2022-04-22 DIAGNOSIS — Z961 Presence of intraocular lens: Secondary | ICD-10-CM

## 2022-04-22 DIAGNOSIS — H35033 Hypertensive retinopathy, bilateral: Secondary | ICD-10-CM | POA: Diagnosis not present

## 2022-04-22 DIAGNOSIS — I10 Essential (primary) hypertension: Secondary | ICD-10-CM | POA: Diagnosis not present

## 2022-04-22 MED ORDER — AFLIBERCEPT 2MG/0.05ML IZ SOLN FOR KALEIDOSCOPE
2.0000 mg | INTRAVITREAL | Status: AC | PRN
  Administered 2022-04-22: 2 mg via INTRAVITREAL

## 2022-05-08 NOTE — Telephone Encounter (Signed)
Left message to return call 

## 2022-05-13 NOTE — Telephone Encounter (Signed)
  Outpatient transplant coordinator :  Lucia Bitter RN  [phone] cell

## 2022-05-20 ENCOUNTER — Ambulatory Visit (INDEPENDENT_AMBULATORY_CARE_PROVIDER_SITE_OTHER): Payer: Medicare Other

## 2022-05-20 VITALS — Ht 71.0 in | Wt 203.0 lb

## 2022-05-20 DIAGNOSIS — Z0000 Encounter for general adult medical examination without abnormal findings: Secondary | ICD-10-CM

## 2022-05-20 NOTE — Progress Notes (Signed)
Virtual Visit via Telephone Note  I connected with  Randy Oneal on 05/20/22 at  3:15 PM EDT by telephone and verified that I am speaking with the correct person using two identifiers.  Location: Patient: home Provider: [facility] Persons participating in the virtual visit: patient/Nurse Health Advisor   I discussed the limitations, risks, security and privacy concerns of performing an evaluation and management service by telephone and the availability of in person appointments. The patient expressed understanding and agreed to proceed.  Interactive audio and video telecommunications were attempted between this nurse and patient, however failed, due to patient having technical difficulties OR patient did not have access to video capability.  We continued and completed visit with audio only.  Some vital signs may be absent or patient reported.   Dionisio David, LPN  Subjective:   Randy Oneal is a 56 y.o. male who presents for Medicare Annual/Subsequent preventive examination.  Review of Systems     Cardiac Risk Factors include: advanced age (>49men, >57 women);hypertension;male gender;diabetes mellitus     Objective:    There were no vitals filed for this visit. There is no height or weight on file to calculate BMI.     05/20/2022    3:26 PM 03/15/2021    7:21 AM 02/03/2020    9:35 AM 12/27/2019    7:24 PM 12/01/2019    4:00 AM 12/01/2019   12:48 AM 06/15/2019    7:07 AM  Advanced Directives  Does Patient Have a Medical Advance Directive? No Yes No No No No Yes  Type of Social research officer, government;Living will     [place];Living will  Does patient want to make changes to medical advance directive?  No - Patient declined     No - Patient declined  Copy of [place] in Chart?  No - copy requested     No - copy requested  Would patient like information on creating a medical advance directive? No - Patient declined  No -  Patient declined  No - Patient declined No - Patient declined     Current Medications (verified) Outpatient Encounter Medications as of 05/20/2022  Medication Sig   acetaminophen (TYLENOL) 500 MG tablet Take 1,000 mg by mouth every 6 (six) hours as needed for moderate pain or headache.   amLODipine (NORVASC) 2.5 MG tablet Take 1 tablet (2.5 mg total) by mouth daily.   amLODipine (NORVASC) 5 MG tablet 0.5 tablets (2.5 mg total) daily.   atorvastatin (LIPITOR) 10 MG tablet Take 1 tablet by mouth daily.   blood glucose meter kit and supplies Use to check sugars 3 times daily   Bromfenac Sodium (PROLENSA) 0.07 % SOLN Place 1 drop into both eyes 4 (four) times daily.   Cholecalciferol 1.25 MG (50000 UT) capsule Take by mouth.   cinacalcet (SENSIPAR) 30 MG tablet Take 30 mg by mouth daily with supper.   Continuous Blood Gluc Sensor (DEXCOM G6 SENSOR) MISC 1 Device by Does not apply route as directed.   Continuous Blood Gluc Transmit (DEXCOM G6 TRANSMITTER) MISC 1 Device by Does not apply route as directed.   glucose blood (ONETOUCH ULTRA) test strip 1 each by Other route in the morning, at noon, in the evening, and at bedtime. Use as instructed   insulin aspart (NOVOLOG FLEXPEN) 100 UNIT/ML FlexPen Inject 7 Units into the skin 3 (three) times daily with meals.   Insulin Pen Needle 32G X 4 MM MISC  1 Device by Does not apply route in the morning, at noon, in the evening, and at bedtime.   lidocaine-prilocaine (EMLA) cream Apply 1 application topically Every Tuesday,Thursday,and Saturday with dialysis.   Methoxy PEG-Epoetin Beta (MIRCERA IJ) Mircera   MICROLET LANCETS MISC USE FOUR TIMES A DAY AS DIRECTED.   nystatin (MYCOSTATIN/NYSTOP) powder Apply topically 2 (two) times daily. To groin and scrotum   prednisoLONE acetate (PRED FORTE) 1 % ophthalmic suspension PLACE (1) DROP INTO BOTH EYES FOUR TIMES DAILY   sildenafil (VIAGRA) 50 MG tablet Take by mouth.   sucroferric oxyhydroxide (VELPHORO) 500 MG  chewable tablet Chew 1,000 mg by mouth 3 (three) times daily with meals.   TRESIBA FLEXTOUCH 100 UNIT/ML FlexTouch Pen Inject 22 Units into the skin at bedtime.   insulin lispro (ADMELOG SOLOSTAR) 100 UNIT/ML KwikPen Inject into the skin. (Patient not taking: Reported on 05/20/2022)   [DISCONTINUED] amLODipine (NORVASC) 5 MG tablet Take 2.5 mg by mouth in the morning. (Patient not taking: Reported on 01/09/2022)   [DISCONTINUED] sildenafil (VIAGRA) 50 MG tablet SMARTSIG:1 Tablet(s) By Mouth   No facility-administered encounter medications on file as of 05/20/2022.    Allergies (verified) Tramadol   History: Past Medical History:  Diagnosis Date   Anemia    Cataract 07/02/2020   Diabetes mellitus    Type 2    Diabetic retinopathy of both eyes ([name]) 01/31/2019   Dr Coralyn Pear 01/2019   ESRD (end stage renal disease) ([name])    [place] Frenisus   Herpes simplex virus (HSV) infection    OU   Hypertension    Hypertensive retinopathy    OU   Retinal detachment    OS   Sepsis due to Streptococcus, group B ([place]) 10/05/2018   Past Surgical History:  Procedure Laterality Date   25 GAUGE PARS PLANA VITRECTOMY WITH 20 GAUGE MVR PORT FOR MACULAR HOLE Right 02/24/2019   Procedure: 25 GAUGE PARS PLANA VITRECTOMY WITH 20 GAUGE MVR PORT FOR MACULAR HOLE;  Surgeon: Bernarda Caffey, MD;  Location: [facility];  Service: Ophthalmology;  Laterality: Right;   APPLICATION OF WOUND VAC Left 08/27/2018   Procedure: APPLICATION OF WOUND VAC, left neck;  Surgeon: Gaye Pollack, MD;  Location: [facility];  Service: Thoracic;  Laterality: Left;   APPLICATION OF WOUND VAC Left 08/30/2018   Procedure: APPLICATION OF WOUND VAC;  Surgeon: Gaye Pollack, MD;  Location: [facility];  Service: Thoracic;  Laterality: Left;   AV FISTULA PLACEMENT Left 02/15/2020   Procedure: LEFT ARM ARTERIOVENOUS (AV) FISTULA CREATION;  Surgeon: Waynetta Sandy, MD;  Location: [facility];  Service: Vascular;  Laterality: Left;   CATARACT EXTRACTION  Bilateral    CATARACT EXTRACTION W/ INTRAOCULAR LENS  IMPLANT, BILATERAL     COLONOSCOPY  06/24/2011   Procedure: COLONOSCOPY;  Surgeon: [name], MD;  [facility];  Service: Endoscopy;  Laterality: N/A;  8:30 AM   EYE SURGERY     GAS/FLUID EXCHANGE Left 03/31/2019   Procedure: Gas/Fluid Exchange;  Surgeon: Bernarda Caffey, MD;  Location: [facility];  Service: Ophthalmology;  Laterality: Left;   INJECTION OF SILICONE OIL  01/23/7091   Procedure: Injection Of Silicone Oil;  Surgeon: Bernarda Caffey, MD;  Location: [facility];  Service: Ophthalmology;;   INSERTION OF DIALYSIS CATHETER Right 12/05/2019   Procedure: INSERTION OF DIALYSIS CATHETER RIGHT SUBCLAVIAN;  Surgeon: Virl Cagey, MD;  [facility];  Service: General;  Laterality: Right;   INSERTION OF DIALYSIS CATHETER Right 12/07/2019  Procedure: Minor Dialysis catheter in place- need to reposition/ adjust catheter to help with flows;  Surgeon: Virl Cagey, MD;  [facility];  Service: General;  Laterality: Right;  procedure room case with 1% lidocaine, full sterile drape,  towels, full gown, large chloraprep, 4-0 Monocryl and dermabond    INSERTION OF DIALYSIS CATHETER Right 12/08/2019   Procedure: INSERTION OF DIALYSIS CATHETER EXCHANGE;  Surgeon: Virl Cagey, MD;  [facility];  Service: General;  Laterality: Right;   IR ANGIOGRAM EXTREMITY RIGHT  03/15/2021   IR RADIOLOGIST EVAL & MGMT  02/07/2021   IR RADIOLOGIST EVAL & MGMT  05/09/2021   IR US GUIDE VASC ACCESS RIGHT  03/15/2021   MEMBRANE PEEL Right 02/24/2019   Procedure: Antoine Primas;  Surgeon: Bernarda Caffey, MD;  Location: [facility];  Service: Ophthalmology;  Laterality: Right;   PARS PLANA VITRECTOMY Left 03/31/2019   Procedure: PARS PLANA VITRECTOMY WITH 25 GAUGE WITH MEMBRANE PEEL;  Surgeon: Bernarda Caffey, MD;  Location: [facility];  Service: Ophthalmology;  Laterality: Left;   PHOTOCOAGULATION WITH LASER Right 02/24/2019   Procedure: Photocoagulation With  Laser;  Surgeon: Bernarda Caffey, MD;  Location: [facility];  Service: Ophthalmology;  Laterality: Right;   PHOTOCOAGULATION WITH LASER Left 03/31/2019   Procedure: Photocoagulation With Laser;  Surgeon: Bernarda Caffey, MD;  Location: [facility];  Service: Ophthalmology;  Laterality: Left;   RETINAL DETACHMENT SURGERY Left 03/31/2019   TRD Repair - Dr. Bernarda Caffey   SILICON OIL REMOVAL Right 1/74/9449   Procedure: Silicon Oil Removal;  Surgeon: Bernarda Caffey, MD;  Location: [facility];  Service: Ophthalmology;  Laterality: Right;   STERNAL WOUND DEBRIDEMENT Left 08/27/2018   Procedure: Incision and DEBRIDEMENT Left Chest, Neck and Mediastinum;  Surgeon: Gaye Pollack, MD;  [facility];  Service: Thoracic;  Laterality: Left;   STERNAL WOUND DEBRIDEMENT Left 08/30/2018   Procedure: WOUND VAC CHANGE, LEFT CHEST AND NECK, POSSIBLE DEBRIDEMENT;  Surgeon: Gaye Pollack, MD;  [facility];  Service: Thoracic;  Laterality: Left;   Family History  Problem Relation Age of Onset   Hypertension Mother    Colon cancer Neg Hx    Social History   Socioeconomic History   Marital status: Married    Spouse name: Not on file   Number of children: Not on file   Years of education: Not on file   Highest education level: Not on file  Occupational History   Not on file  Tobacco Use   Smoking status: Never   Smokeless tobacco: Never  Vaping Use   Vaping Use: Never used  Substance and Sexual Activity   Alcohol use: No   Drug use: No   Sexual activity: Yes  Other Topics Concern   Not on file  Social History Narrative   Not on file   Social Determinants of Health   Financial Resource Strain: Low Risk  (05/20/2022)   Overall Financial Resource Strain (CARDIA)    Difficulty of Paying Living Expenses: Not hard at all  Food Insecurity: No Food Insecurity (05/20/2022)   Hunger Vital Sign    Worried About Running Out of Food in the Last Year: Never true    Ran Out of Food in the Last Year: Never true   Transportation Needs: No Transportation Needs (05/20/2022)   PRAPARE - Hydrologist (Medical): No    Lack of Transportation (Non-Medical): No  Physical Activity: Sufficiently Active (05/20/2022)   Exercise Vital Sign  Days of Exercise per Week: 3 days    Minutes of Exercise per Session: 50 min  Stress: No Stress Concern Present (05/20/2022)   [place]    Feeling of Stress : Not at all  Social Connections: Moderately Integrated (05/20/2022)   Social Connection and Isolation Panel [NHANES]    Frequency of Communication with Friends and Family: More than three times a week    Frequency of Social Gatherings with Friends and Family: More than three times a week    Attends Religious Services: More than 4 times per year    Active Member of [employer] or Organizations: No    Attends Music therapist: Never    Marital Status: Married    Tobacco Counseling Counseling given: Not Answered   Clinical Intake:  Pre-visit preparation completed: Yes  Pain : No/denies pain     Nutritional Risks: None Diabetes: Yes CBG done?: No Did pt. bring in CBG monitor from home?: No  How often do you need to have someone help you when you read instructions, pamphlets, or other written materials from your doctor or pharmacy?: 1 - Never  Diabetic?yes Nutrition Risk Assessment:  Has the patient had any N/V/D within the last 2 months?  No  Does the patient have any non-healing wounds?  No  Has the patient had any unintentional weight loss or weight gain?  Yes   Diabetes:  Is the patient diabetic?  Yes  If diabetic, was a CBG obtained today?  No  Did the patient bring in their glucometer from home?  No  How often do you monitor your CBG's? Two times a day.   Financial Strains and Diabetes Management:  Are you having any financial strains with the device, your supplies or your medication?  No .  Does the patient want to be seen by Chronic Care Management for management of their diabetes?  No  Would the patient like to be referred to a Nutritionist or for Diabetic Management?  No   Diabetic Exams:  Diabetic Eye Exam: Completed 02/18/22.  Pt has been advised about the importance in completing this exam.  Diabetic Foot Exam: Completed 03/18/22. Pt has been advised about the importance in completing this exam.   Interpreter Needed?: No  Information entered by :: Kirke Shaggy, LPN   Activities of Daily Living    05/20/2022    3:27 PM  In your present state of health, do you have any difficulty performing the following activities:  Hearing? 0  Vision? 1  Difficulty concentrating or making decisions? 0  Walking or climbing stairs? 0  Dressing or bathing? 0  Doing errands, shopping? 1  Preparing Food and eating ? N  Using the Toilet? N  In the past six months, have you accidently leaked urine? N  Do you have problems with loss of bowel control? N  Managing your Medications? N  Managing your Finances? N  Housekeeping or managing your Housekeeping? N    Patient Care Team: [name], MD as PCP - General ([place] any recent Medical Services you may have received from other than [facility] providers in the past year (date may be approximate).     Assessment:   This is a routine wellness examination for Randy Oneal.  Hearing/Vision screen Hearing Screening - Comments:: No aids Vision Screening - Comments:: Wears glasses- Dr.Zamora in [place]  Dietary issues and exercise activities discussed: Current Exercise Habits: Home exercise  routine, Time (Minutes): 30, Frequency (Times/Week): 3, Weekly Exercise (Minutes/Week): 90, Intensity: Mild   Goals Addressed             This Visit's Progress    DIET - EAT MORE FRUITS AND VEGETABLES         Depression Screen    05/20/2022    3:24 PM 10/22/2020    8:27 AM 04/15/2019    1:41 PM  07/28/2017   12:21 PM 12/07/2015    9:51 AM 11/22/2015    9:25 AM  PHQ 2/9 Scores  PHQ - 2 Score 0 0 0 0 0 0  PHQ- 9 Score 0         Fall Risk    05/20/2022    3:27 PM 04/15/2019    1:41 PM 07/28/2017   12:21 PM 12/07/2015    9:51 AM 11/22/2015    9:25 AM  [place] in the past year? 0 0 No No No  Number falls in past yr: 0      Injury with Fall? 0      Risk for fall due to : No Fall Risks      Follow up Falls prevention discussed;Falls evaluation completed        FALL RISK PREVENTION PERTAINING TO THE HOME:  Any stairs in or around the home? No  If so, are there any without handrails? No  Home free of loose throw rugs in walkways, pet beds, electrical cords, etc? Yes  Adequate lighting in your home to reduce risk of falls? Yes   ASSISTIVE DEVICES UTILIZED TO PREVENT FALLS:  Life alert? No  Use of a cane, walker or w/c? No  Grab bars in the bathroom? Yes  Shower chair or bench in shower? Yes  Elevated toilet seat or a handicapped toilet? No   Cognitive Function:        Immunizations Immunization History  Administered Date(s) Administered   Hep B, Unspecified 01/26/2020, 03/01/2020, 04/26/2020   Hepatitis B, PED/ADOLESCENT 12/29/2019   Hepatitis B, adult 12/29/2019   Hepb-cpg 01/26/2020, 03/01/2020, 04/26/2020   Influenza, Quadrivalent, Recombinant, Inj, Pf 05/16/2022   Influenza,inj,Quad PF,6+ Mos 08/21/2018, 05/05/2020, 05/02/2021   Influenza-Unspecified 05/05/2020   Moderna Sars-Covid-2 Vaccination 12/24/2019, 01/23/2020, 05/24/2020   Pfizer Covid-19 Vaccine Bivalent Booster 8yrs & up 09/12/2021   Pneumococcal Conjugate-13 02/04/2020   Pneumococcal Polysaccharide-23 01/30/2014, 04/12/2020    TDAP status: Due, Education has been provided regarding the importance of this vaccine. Advised may receive this vaccine at local pharmacy or Health Dept. Aware to provide a copy of the vaccination record if obtained from local pharmacy or Health Dept. Verbalized  acceptance and understanding.  Flu Vaccine status: Up to date  Pneumococcal vaccine status: Up to date  Covid-19 vaccine status: Completed vaccines  Qualifies for Shingles Vaccine? No   Zostavax completed No   Shingrix Completed?: No.    Education has been provided regarding the importance of this vaccine. Patient has been advised to call insurance company to determine out of pocket expense if they have not yet received this vaccine. Advised may also receive vaccine at local pharmacy or Health Dept. Verbalized acceptance and understanding.  Screening Tests Health Maintenance  Topic Date Due   TETANUS/TDAP  Never done   Zoster Vaccines- Shingrix (1 of 2) Never done   HEMOGLOBIN A1C  04/12/2021   COLONOSCOPY (Pts 45-53yrs Insurance coverage will need to be confirmed)  06/23/2021   OPHTHALMOLOGY EXAM  02/19/2023   FOOT EXAM  03/19/2023  INFLUENZA VACCINE  Completed   COVID-19 Vaccine  Completed   Hepatitis C Screening  Completed   HIV Screening  Completed   HPV VACCINES  Aged Out    Health Maintenance  Health Maintenance Due  Topic Date Due   TETANUS/TDAP  Never done   Zoster Vaccines- Shingrix (1 of 2) Never done   HEMOGLOBIN A1C  04/12/2021   COLONOSCOPY (Pts 45-52yrs Insurance coverage will need to be confirmed)  06/23/2021    Declined referral for colonoscopy- waiting for kidney doctor clearance  Lung Cancer Screening: (Low Dose CT Chest recommended if Age 63-80 years, 30 pack-year currently smoking OR have quit w/in 15years.) does not qualify.   Additional Screening:  Hepatitis C Screening: does qualify; Completed 10/22/20  Vision Screening: Recommended annual ophthalmology exams for early detection of glaucoma and other disorders of the eye. Is the patient up to date with their annual eye exam?  Yes  Who is the provider or what is the name of the office in which the patient attends annual eye exams? Dr. Bernette Mayers in [place] If pt is not established with a provider, would  they like to be referred to a provider to establish care? No .   Dental Screening: Recommended annual dental exams for proper oral hygiene  Community Resource Referral / Chronic Care Management: CRR required this visit?  No   CCM required this visit?  No      Plan:     I have personally reviewed and noted the following in the patient's chart:   Medical and social history Use of alcohol, tobacco or illicit drugs  Current medications and supplements including opioid prescriptions. Patient is not currently taking opioid prescriptions. Functional ability and status Nutritional status Physical activity Advanced directives List of other physicians Hospitalizations, surgeries, and ER visits in previous 12 months Vitals Screenings to include cognitive, depression, and falls Referrals and appointments  In addition, I have reviewed and discussed with patient certain preventive protocols, quality metrics, and best practice recommendations. A written personalized care plan for preventive services as well as general preventive health recommendations were provided to patient.     Dionisio David, LPN   16/05/9603   Nurse Notes: none

## 2022-05-20 NOTE — Patient Instructions (Signed)
Randy Oneal , Thank you for taking time to come for your Medicare Wellness Visit. I appreciate your ongoing commitment to your health goals. Please review the following plan we discussed and let me know if I can assist you in the future.   Screening recommendations/referrals: Colonoscopy: declined referral Recommended yearly ophthalmology/optometry visit for glaucoma screening and checkup Recommended yearly dental visit for hygiene and checkup  Vaccinations: Influenza vaccine: 05/02/21  Pneumococcal vaccine: 04/12/20 Tdap vaccine: n/d Shingles vaccine: n/d   Covid-19: 12/24/19, 01/23/20, 05/24/20  Advanced directives: no  Conditions/risks identified: none  Next appointment: Follow up in one year for your annual wellness visit 06/02/23 @ 8:45 am by phone  Preventive Care 40-64 Years, Male Preventive care refers to lifestyle choices and visits with your health care provider that can promote health and wellness. What does preventive care include? A yearly physical exam. This is also called an annual well check. Dental exams once or twice a year. Routine eye exams. Ask your health care provider how often you should have your eyes checked. Personal lifestyle choices, including: Daily care of your teeth and gums. Regular physical activity. Eating a healthy diet. Avoiding tobacco and drug use. Limiting alcohol use. Practicing safe sex. Taking low-dose aspirin every day starting at age 41. What happens during an annual well check? The services and screenings done by your health care provider during your annual well check will depend on your age, overall health, lifestyle risk factors, and family history of disease. Counseling  Your health care provider may ask you questions about your: Alcohol use. Tobacco use. Drug use. Emotional well-being. Home and relationship well-being. Sexual activity. Eating habits. Work and work Statistician. Screening  You may have the following tests or  measurements: Height, weight, and BMI. Blood pressure. Lipid and cholesterol levels. These may be checked every 5 years, or more frequently if you are over 35 years old. Skin check. Lung cancer screening. You may have this screening every year starting at age 39 if you have a 30-pack-year history of smoking and currently smoke or have quit within the past 15 years. Fecal occult blood test (FOBT) of the stool. You may have this test every year starting at age 29. Flexible sigmoidoscopy or colonoscopy. You may have a sigmoidoscopy every 5 years or a colonoscopy every 10 years starting at age 1. Prostate cancer screening. Recommendations will vary depending on your family history and other risks. Hepatitis C blood test. Hepatitis B blood test. Sexually transmitted disease (STD) testing. Diabetes screening. This is done by checking your blood sugar (glucose) after you have not eaten for a while (fasting). You may have this done every 1-3 years. Discuss your test results, treatment options, and if necessary, the need for more tests with your health care provider. Vaccines  Your health care provider may recommend certain vaccines, such as: Influenza vaccine. This is recommended every year. Tetanus, diphtheria, and acellular pertussis (Tdap, Td) vaccine. You may need a Td booster every 10 years. Zoster vaccine. You may need this after age 37. Pneumococcal 13-valent conjugate (PCV13) vaccine. You may need this if you have certain conditions and have not been vaccinated. Pneumococcal polysaccharide (PPSV23) vaccine. You may need one or two doses if you smoke cigarettes or if you have certain conditions. Talk to your health care provider about which screenings and vaccines you need and how often you need them. This information is not intended to replace advice given to you by your health care provider. Make sure you discuss any  questions you have with your health care provider. Document Released:  08/24/2015 Document Revised: 04/16/2016 Document Reviewed: 05/29/2015 Elsevier Interactive Patient Education  2017 [place] Prevention in the Home Falls can cause injuries. They can happen to people of all ages. There are many things you can do to make your home safe and to help prevent falls. What can I do on the outside of my home? Regularly fix the edges of walkways and driveways and fix any cracks. Remove anything that might make you trip as you walk through a door, such as a raised step or threshold. Trim any bushes or trees on the path to your home. Use bright outdoor lighting. Clear any walking paths of anything that might make someone trip, such as rocks or tools. Regularly check to see if handrails are loose or broken. Make sure that both sides of any steps have handrails. Any raised decks and porches should have guardrails on the edges. Have any leaves, snow, or ice cleared regularly. Use sand or salt on walking paths during winter. Clean up any spills in your garage right away. This includes oil or grease spills. What can I do in the bathroom? Use night lights. Install grab bars by the toilet and in the tub and shower. Do not use towel bars as grab bars. Use non-skid mats or decals in the tub or shower. If you need to sit down in the shower, use a plastic, non-slip stool. Keep the floor dry. Clean up any water that spills on the floor as soon as it happens. Remove soap buildup in the tub or shower regularly. Attach bath mats securely with double-sided non-slip rug tape. Do not have throw rugs and other things on the floor that can make you trip. What can I do in the bedroom? Use night lights. Make sure that you have a light by your bed that is easy to reach. Do not use any sheets or blankets that are too big for your bed. They should not hang down onto the floor. Have a firm chair that has side arms. You can use this for support while you get dressed. Do not have  throw rugs and other things on the floor that can make you trip. What can I do in the kitchen? Clean up any spills right away. Avoid walking on wet floors. Keep items that you use a lot in easy-to-reach places. If you need to reach something above you, use a strong step stool that has a grab bar. Keep electrical cords out of the way. Do not use floor polish or wax that makes floors slippery. If you must use wax, use non-skid floor wax. Do not have throw rugs and other things on the floor that can make you trip. What can I do with my stairs? Do not leave any items on the stairs. Make sure that there are handrails on both sides of the stairs and use them. Fix handrails that are broken or loose. Make sure that handrails are as long as the stairways. Check any carpeting to make sure that it is firmly attached to the stairs. Fix any carpet that is loose or worn. Avoid having throw rugs at the top or bottom of the stairs. If you do have throw rugs, attach them to the floor with carpet tape. Make sure that you have a light switch at the top of the stairs and the bottom of the stairs. If you do not have them, ask someone to add them  for you. What else can I do to help prevent falls? Wear shoes that: Do not have high heels. Have rubber bottoms. Are comfortable and fit you well. Are closed at the toe. Do not wear sandals. If you use a stepladder: Make sure that it is fully opened. Do not climb a closed stepladder. Make sure that both sides of the stepladder are locked into place. Ask someone to hold it for you, if possible. Clearly mark and make sure that you can see: Any grab bars or handrails. First and last steps. Where the edge of each step is. Use tools that help you move around (mobility aids) if they are needed. These include: Canes. Walkers. Scooters. Crutches. Turn on the lights when you go into a dark area. Replace any light bulbs as soon as they burn out. Set up your furniture so  you have a clear path. Avoid moving your furniture around. If any of your floors are uneven, fix them. If there are any pets around you, be aware of where they are. Review your medicines with your doctor. Some medicines can make you feel dizzy. This can increase your chance of falling. Ask your doctor what other things that you can do to help prevent falls. This information is not intended to replace advice given to you by your health care provider. Make sure you discuss any questions you have with your health care provider. Document Released: 05/24/2009 Document Revised: 01/03/2016 Document Reviewed: 09/01/2014 Elsevier Interactive Patient Education  2017 [employer].

## 2022-06-10 NOTE — Progress Notes (Signed)
[facility] Note  06/24/2022    CHIEF COMPLAINT Patient presents for Retina Follow Up  HISTORY OF PRESENT ILLNESS: Randy Oneal is a 56 y.o. male who presents to the clinic today for:  HPI     Retina Follow Up   Patient presents with  Diabetic Retinopathy.  In both eyes.  This started years ago.  Duration of 9 weeks.  Since onset it is stable.  I, the attending physician,  performed the HPI with the patient and updated documentation appropriately.        Comments   Patient feels that the vision is a little better. He is using Prolensa and Pred OU QID. His general health is the same. His blood sugar was 160 and his A1C is 9.8.      Last edited by Bernarda Caffey, MD on 06/24/2022 11:15 PM.     Pt feels like he can see better, but has trouble at night  Referring physician: [name], MD [address] [place] [address]  HISTORICAL INFORMATION:   Selected notes from the MEDICAL RECORD NUMBER Referred by Dr.Mark Gershon Crane for concern of decreased vision post cataract sx   CURRENT MEDICATIONS: Current Outpatient Medications (Ophthalmic Drugs)  Medication Sig   Bromfenac Sodium (PROLENSA) 0.07 % SOLN Place 1 drop into both eyes 4 (four) times daily.   prednisoLONE acetate (PRED FORTE) 1 % ophthalmic suspension PLACE (1) DROP INTO BOTH EYES FOUR TIMES DAILY   No current facility-administered medications for this visit. (Ophthalmic Drugs)   Current Outpatient Medications (Other)  Medication Sig   acetaminophen (TYLENOL) 500 MG tablet Take 1,000 mg by mouth every 6 (six) hours as needed for moderate pain or headache.   amLODipine (NORVASC) 2.5 MG tablet Take 1 tablet (2.5 mg total) by mouth daily.   amLODipine (NORVASC) 5 MG tablet 0.5 tablets (2.5 mg total) daily.   atorvastatin (LIPITOR) 10 MG tablet Take 1 tablet by mouth daily.   blood glucose meter kit and supplies Use to check sugars 3 times daily   Cholecalciferol  1.25 MG (50000 UT) capsule Take by mouth.   cinacalcet (SENSIPAR) 30 MG tablet Take 30 mg by mouth daily with supper.   Continuous Blood Gluc Sensor (DEXCOM G6 SENSOR) MISC 1 Device by Does not apply route as directed.   Continuous Blood Gluc Transmit (DEXCOM G6 TRANSMITTER) MISC 1 Device by Does not apply route as directed.   glucose blood (ONETOUCH ULTRA) test strip 1 each by Other route in the morning, at noon, in the evening, and at bedtime. Use as instructed   insulin aspart (NOVOLOG FLEXPEN) 100 UNIT/ML FlexPen Inject 7 Units into the skin 3 (three) times daily with meals.   insulin lispro (ADMELOG SOLOSTAR) 100 UNIT/ML KwikPen Inject into the skin. (Patient not taking: Reported on 05/20/2022)   Insulin Pen Needle 32G X 4 MM MISC 1 Device by Does not apply route in the morning, at noon, in the evening, and at bedtime.   lidocaine-prilocaine (EMLA) cream Apply 1 application topically Every Tuesday,Thursday,and Saturday with dialysis.   Methoxy PEG-Epoetin Beta (MIRCERA IJ) Mircera   MICROLET LANCETS MISC USE FOUR TIMES A DAY AS DIRECTED.   nystatin (MYCOSTATIN/NYSTOP) powder Apply topically 2 (two) times daily. To groin and scrotum   sildenafil (VIAGRA) 50 MG tablet Take by mouth.   sucroferric oxyhydroxide (VELPHORO) 500 MG chewable tablet Chew 1,000 mg by mouth 3 (three) times daily with meals.   TRESIBA  FLEXTOUCH 100 UNIT/ML FlexTouch Pen Inject 22 Units into the skin at bedtime.   No current facility-administered medications for this visit. (Other)   REVIEW OF SYSTEMS: ROS   Positive for: Genitourinary, Endocrine, Eyes Negative for: Constitutional, Gastrointestinal, Neurological, Skin, Musculoskeletal, HENT, Cardiovascular, Respiratory, Psychiatric, Allergic/Imm, Heme/Lymph Last edited by Annie Paras, COT on 06/24/2022  2:07 PM.     ALLERGIES Allergies  Allergen Reactions   Tramadol Nausea And Vomiting   PAST MEDICAL HISTORY Past Medical History:  Diagnosis Date    Anemia    Cataract 07/02/2020   Diabetes mellitus    Type 2    Diabetic retinopathy of both eyes ([place]) 01/31/2019   Dr Coralyn Pear 01/2019   ESRD (end stage renal disease) ([place])    [place] Frenisus   Herpes simplex virus (HSV) infection    OU   Hypertension    Hypertensive retinopathy    OU   Retinal detachment    OS   Sepsis due to Streptococcus, group B ([place]) 10/05/2018   Past Surgical History:  Procedure Laterality Date   25 GAUGE PARS PLANA VITRECTOMY WITH 20 GAUGE MVR PORT FOR MACULAR HOLE Right 02/24/2019   Procedure: 25 GAUGE PARS PLANA VITRECTOMY WITH 20 GAUGE MVR PORT FOR MACULAR HOLE;  Surgeon: Bernarda Caffey, MD;  Location: [place];  Service: Ophthalmology;  Laterality: Right;   APPLICATION OF WOUND VAC Left 08/27/2018   Procedure: APPLICATION OF WOUND VAC, left neck;  Surgeon: Gaye Pollack, MD;  Location: [place];  Service: Thoracic;  Laterality: Left;   APPLICATION OF WOUND VAC Left 08/30/2018   Procedure: APPLICATION OF WOUND VAC;  Surgeon: Gaye Pollack, MD;  Location: [place];  Service: Thoracic;  Laterality: Left;   AV FISTULA PLACEMENT Left 02/15/2020   Procedure: LEFT ARM ARTERIOVENOUS (AV) FISTULA CREATION;  Surgeon: Waynetta Sandy, MD;  Location: [place];  Service: Vascular;  Laterality: Left;   CATARACT EXTRACTION Bilateral    CATARACT EXTRACTION W/ INTRAOCULAR LENS  IMPLANT, BILATERAL     COLONOSCOPY  06/24/2011   Procedure: COLONOSCOPY;  Surgeon: [name], MD;  [facility];  Service: Endoscopy;  Laterality: N/A;  8:30 AM   EYE SURGERY     GAS/FLUID EXCHANGE Left 03/31/2019   Procedure: Gas/Fluid Exchange;  Surgeon: Bernarda Caffey, MD;  Location: [place];  Service: Ophthalmology;  Laterality: Left;   INJECTION OF SILICONE OIL  8/32/5498   Procedure: Injection Of Silicone Oil;  Surgeon: Bernarda Caffey, MD;  Location: [place];  Service: Ophthalmology;;   INSERTION OF DIALYSIS CATHETER Right 12/05/2019   Procedure: INSERTION OF DIALYSIS CATHETER RIGHT  SUBCLAVIAN;  Surgeon: Virl Cagey, MD;  Location: [facility];  Service: General;  Laterality: Right;   INSERTION OF DIALYSIS CATHETER Right 12/07/2019   Procedure: Minor Dialysis catheter in place- need to reposition/ adjust catheter to help with flows;  Surgeon: Virl Cagey, MD;  Location: [facility];  Service: General;  Laterality: Right;  procedure room case with 1% lidocaine, full sterile drape,  towels, full gown, large chloraprep, 4-0 Monocryl and dermabond    INSERTION OF DIALYSIS CATHETER Right 12/08/2019   Procedure: INSERTION OF DIALYSIS CATHETER EXCHANGE;  Surgeon: Virl Cagey, MD;  Location: [facility];  Service: General;  Laterality: Right;   IR ANGIOGRAM EXTREMITY RIGHT  03/15/2021   IR RADIOLOGIST EVAL & MGMT  02/07/2021   IR RADIOLOGIST EVAL & MGMT  05/09/2021   IR US GUIDE VASC ACCESS RIGHT  03/15/2021   MEMBRANE  PEEL Right 02/24/2019   Procedure: Antoine Primas;  Surgeon: Bernarda Caffey, MD;  Location: [place];  Service: Ophthalmology;  Laterality: Right;   PARS PLANA VITRECTOMY Left 03/31/2019   Procedure: PARS PLANA VITRECTOMY WITH 25 GAUGE WITH MEMBRANE PEEL;  Surgeon: Bernarda Caffey, MD;  Location: [place];  Service: Ophthalmology;  Laterality: Left;   PHOTOCOAGULATION WITH LASER Right 02/24/2019   Procedure: Photocoagulation With Laser;  Surgeon: Bernarda Caffey, MD;  Location: [place];  Service: Ophthalmology;  Laterality: Right;   PHOTOCOAGULATION WITH LASER Left 03/31/2019   Procedure: Photocoagulation With Laser;  Surgeon: Bernarda Caffey, MD;  Location: [place];  Service: Ophthalmology;  Laterality: Left;   RETINAL DETACHMENT SURGERY Left 03/31/2019   TRD Repair - Dr. Bernarda Caffey   SILICON OIL REMOVAL Right 10/06/3333   Procedure: Silicon Oil Removal;  Surgeon: Bernarda Caffey, MD;  Location: [place];  Service: Ophthalmology;  Laterality: Right;   STERNAL WOUND DEBRIDEMENT Left 08/27/2018   Procedure: Incision and DEBRIDEMENT Left Chest, Neck and Mediastinum;  Surgeon: Gaye Pollack, MD;   [facility];  Service: Thoracic;  Laterality: Left;   STERNAL WOUND DEBRIDEMENT Left 08/30/2018   Procedure: WOUND VAC CHANGE, LEFT CHEST AND NECK, POSSIBLE DEBRIDEMENT;  Surgeon: Gaye Pollack, MD;  Location[facility];  Service: Thoracic;  Laterality: Left;   FAMILY HISTORY Family History  Problem Relation Age of Onset   Hypertension Mother    Colon cancer Neg Hx    SOCIAL HISTORY Social History   Tobacco Use   Smoking status: Never   Smokeless tobacco: Never  Vaping Use   Vaping Use: Never used  Substance Use Topics   Alcohol use: No   Drug use: No       OPHTHALMIC EXAM:  Base Eye Exam     Visual Acuity (Snellen - Linear)       Right Left   Dist [place] 20/200 20/200   Dist ph [place] 20/100 +1 20/150 +1         Tonometry (Tonopen, 2:13 PM)       Right Left   Pressure 21 22         Pupils       Dark Light Shape React APD   Right 4 3 Irregular Minimal None   Left 4 3 Irregular Minimal None         Visual Fields       Left Right   Restrictions Partial outer superior temporal, inferior temporal, inferior nasal deficiencies Partial outer superior temporal, inferior temporal deficiencies         Extraocular Movement       Right Left    Full, Ortho Full, Ortho         Neuro/Psych     Oriented x3: Yes   Mood/Affect: Normal         Dilation     Both eyes: 1.0% Mydriacyl, 2.5% Phenylephrine @ 2:08 PM           Slit Lamp and Fundus Exam     Slit Lamp Exam       Right Left   Lids/Lashes mild Meibomian gland dysfunction mild Meibomian gland dysfunction   Conjunctiva/Sclera subconj silicon oil bubbles, Chemosis, conj Cyst White and quiet   Cornea Trace PEE, Well healed cataract wound, arcus, Debris in tear film Trace PEE, Well healed cataract wound, arcus, Debris in tear film   Anterior Chamber Deep and quiet, no cell or flare Deep and quiet, no cell or flare  Iris slightly irregular dilation, posterior synchiae from 3:00-1200 round,  focal Posterior synechiae at 1030   Lens PC IOL in good position, pigment deposition, 1+ PCO, mild pigment deposition on optic PC IOL in good position, mild pigment deposition   Anterior Vitreous Post vit, silicon oil bubble ~23% post vitrectomy, good silicone oil fill         Fundus Exam       Right Left   Disc 2+pallor, sharp rim, mild fibrosis along SN rim +pallor, sharp rim, fine NVD--regressed, mild fibrosis   C/D Ratio 0.2 0.2   Macula attached under oil, scattered MA/IRH -- improved, nasal and central thickening / edema -- persistent, but improved attached under oil, persistent fibrosis, edema -- improved, focal bands of PRF with traction emanating from ST macula -- stable, focal DBH temporal macula -- improved   Vessels severe attenuation, Tortuous Attenuated, dilated venules, Tortuous   Periphery Attached, dense 360 PRP laser, mild scattered fibrosis - persistent, Focal ret hole along distal IT arcades--good laser surrounding Attached, good 360 PRP laser changes, pre-retinal fibrosis extending to posterior PRP border superior and inferiorly           IMAGING AND PROCEDURES  Imaging and Procedures for _0 @  OCT, Retina - OU - Both Eyes       Right Eye Quality was borderline. Central Foveal Thickness: 566. Progression has improved. Findings include no SRF, abnormal foveal contour, epiretinal membrane, intraretinal fluid, outer retinal atrophy, preretinal fibrosis (Mild interval improvement in IRF/edema nasal macula ).   Left Eye Quality was good. Central Foveal Thickness: 432. Progression has improved. Findings include no SRF, abnormal foveal contour, intraretinal hyper-reflective material, epiretinal membrane, intraretinal fluid, preretinal fibrosis (Interval improvement in IRF/IRHM).   Notes *Images captured and stored on drive  Diagnosis / Impression:  +DME OU OD: Mild interval improvement in IRF/edema nasal macula  OS: Interval improvement in IRF/IRHM  Clinical  management:  See below  Abbreviations: NFP - Normal foveal profile. CME - cystoid macular edema. PED - pigment epithelial detachment. IRF - intraretinal fluid. SRF - subretinal fluid. EZ - ellipsoid zone. ERM - epiretinal membrane. ORA - outer retinal atrophy. ORT - outer retinal tubulation. SRHM - subretinal hyper-reflective material      Intravitreal Injection, Pharmacologic Agent - OD - Right Eye       Time Out 06/24/2022. 3:24 PM. Confirmed correct patient, procedure, site, and patient consented.   Anesthesia Topical anesthesia was used. Anesthetic medications included Lidocaine 2%, Proparacaine 0.5%.   Procedure Preparation included 5% betadine to ocular surface, eyelid speculum. A (32g) needle was used.   Injection: 2 mg aflibercept 2 MG/0.05ML   Route: Intravitreal, Site: Right Eye   NDC: A3590391, Lot: [id number], Expiration date: 01/08/2023, Waste: 0 mL   Post-op Post injection exam found visual acuity of at least counting fingers. The patient tolerated the procedure well. There were no complications. The patient received written and verbal post procedure care education. Post injection medications were not given.      Intravitreal Injection, Pharmacologic Agent - OS - Left Eye       Time Out 06/24/2022. 3:24 PM. Confirmed correct patient, procedure, site, and patient consented.   Anesthesia Topical anesthesia was used. Anesthetic medications included Lidocaine 2%, Proparacaine 0.5%.   Procedure Preparation included 5% betadine to ocular surface, eyelid speculum. A (32g) needle was used.   Injection: 2 mg aflibercept 2 MG/0.05ML   Route: Intravitreal, Site: Left Eye   NDC: A3590391, Lot: [id number], Expiration date: 08/12/2023, Waste:  0 mL   Post-op Post injection exam found visual acuity of at least counting fingers. The patient tolerated the procedure well. There were no complications. The patient received written and verbal post procedure care  education. Post injection medications were not given.            ASSESSMENT/PLAN:    ICD-10-CM   1. Proliferative diabetic retinopathy of both eyes with macular edema associated with type 2 diabetes mellitus (HCC)  E11.3513 OCT, Retina - OU - Both Eyes    Intravitreal Injection, Pharmacologic Agent - OD - Right Eye    Intravitreal Injection, Pharmacologic Agent - OS - Left Eye    aflibercept (EYLEA) SOLN 2 mg    aflibercept (EYLEA) SOLN 2 mg    2. Retinal detachment, tractional, bilateral  H33.43     3. Vitreous hemorrhage of both eyes ([place])  H43.13     4. Essential hypertension  I10     5. Hypertensive retinopathy of both eyes  H35.033     6. Pseudophakia of both eyes  Z96.1      1-3. Proliferative diabetic retinopathy with DME, TRD, and vitreous hemorrhage OU (OD > OS)  - last A1c: 9.6 on 11.11.22  - lost to f/u from Jan 2021 to Oct 2021 due to loss of insurance coverage  - pt with complex medical history with hospitalization in Jan-Feb 2020 for bacteremia and abscess  - history of poor glycemic control for years  - s/p IVA OD #1 (06.20.20), #2 (07.01.20), #3 (09.11.20), #4 (10.09.20), #5 (11.06.20), #6 (11.24.21), #7 (12.22.21), #8 (01.19.22)  - s/p IVA OS #1 (06.20.20), #2 (07.01.20), #3 (08.13.20), #4 (09.11.20)  #5 (11.06.20), #6 (11.24.21), #7 (12.22.21), #8 (01.19.22)  - s/p IVE OU #1 (12.04.20) - sample; #2 (01.06.21), #3 (02.18.22), #4 (03.18.22), #5 (04.18.22), #6 (05.25.22), #7 (07.01.22), #8 (07.29.22), #9 (09.02.22), #10 (09.30.22), #11 (11.04.22), #12 (12.09.23), #13 (01.13.23), #14 (03.02.23), #15 (04.11.23), #16 (06.01.23), #17 (07.11.23), #18 (09.12.23)  - s/p STK OS #1 (10.09.20)  - s/p PRP OS (06.10.20)  - s/p PPV/PFC/EL/FAX/silicone oil OD, 78.29.56  - s/p subconj silicon oil removal OD 8.20.20  - s/p PPV/EL/FAX/silicone oil OS, 21.30.86  - BCVA OD: 20/100 (improved), 20/150 OS (stable)             - OCT: OD: Mild interval improvement in IRF/edema nasal  macula; OS: Interval improvement in IRF/IRHM at 9 weeks  - recommend IVE OU #19 for DME today, 11.14.23 with f/u in 9 weeks  - discussed possible switch to Vabysmo -- approved, but will hold off for now due to good response w/ IVE  - RBA of procedure discussed, questions answered  - informed consent obtained and re-signed 06.01.23  - see procedure note             - Eylea benefits investigation begun 01.19.22 -- approved for 2023  - cont topical PF and Prolensa QID OU for possible CME component  - f/u 9 weeks -- DFE/OCT, possible injection  4,5. Hypertensive retinopathy OU  - discussed importance of tight BP control  - monitor  6. Pseudophakia OU  - s/p CE with IOL (Dr. Gershon Crane)  Ophthalmic Meds Ordered this visit:  Meds ordered this encounter  Medications   aflibercept (EYLEA) SOLN 2 mg   aflibercept (EYLEA) SOLN 2 mg     Return in about 9 weeks (around 08/26/2022) for f/u PDR OU, DFE, OCT.  There are no Patient Instructions on file for this visit.  This document serves  as a record of services personally performed by Gardiner Sleeper, MD, PhD. It was created on their behalf by Renaldo Reel, [place] an ophthalmic technician. The creation of this record is the provider's dictation and/or activities during the visit.    Electronically signed by:  Renaldo Reel, COT  10.31.23 11:20 PM  This document serves as a record of services personally performed by Gardiner Sleeper, MD, PhD. It was created on their behalf by San Jetty. Owens Shark, OA an ophthalmic technician. The creation of this record is the provider's dictation and/or activities during the visit.    Electronically signed by: San Jetty. Owens Shark, [place] 11.14.2023 11:20 PM   Gardiner Sleeper, M.D., Ph.D. Diseases & Surgery of the Retina and Vitreous [facility]  I have reviewed the above documentation for accuracy and completeness, and I agree with the above. Gardiner Sleeper, M.D., Ph.D. 06/24/22 11:22  PM   Abbreviations: M myopia (nearsighted); A astigmatism; H hyperopia (farsighted); P presbyopia; Mrx spectacle prescription;  CTL contact lenses; OD right eye; OS left eye; OU both eyes  XT exotropia; ET esotropia; PEK punctate epithelial keratitis; PEE punctate epithelial erosions; DES dry eye syndrome; MGD meibomian gland dysfunction; ATs artificial tears; PFAT's preservative free artificial tears; [place] nuclear sclerotic cataract; PSC posterior subcapsular cataract; ERM epi-retinal membrane; PVD posterior vitreous detachment; RD retinal detachment; DM diabetes mellitus; DR diabetic retinopathy; NPDR non-proliferative diabetic retinopathy; PDR proliferative diabetic retinopathy; CSME clinically significant macular edema; DME diabetic macular edema; dbh dot blot hemorrhages; CWS cotton wool spot; POAG primary open angle glaucoma; C/D cup-to-disc ratio; HVF humphrey visual field; GVF goldmann visual field; OCT optical coherence tomography; IOP intraocular pressure; BRVO Branch retinal vein occlusion; CRVO central retinal vein occlusion; CRAO central retinal artery occlusion; BRAO branch retinal artery occlusion; RT retinal tear; SB scleral buckle; PPV pars plana vitrectomy; VH Vitreous hemorrhage; PRP panretinal laser photocoagulation; IVK intravitreal kenalog; VMT vitreomacular traction; MH Macular hole;  NVD neovascularization of the disc; NVE neovascularization elsewhere; AREDS age related eye disease study; ARMD age related macular degeneration; POAG primary open angle glaucoma; EBMD epithelial/anterior basement membrane dystrophy; ACIOL anterior chamber intraocular lens; IOL intraocular lens; PCIOL posterior chamber intraocular lens; Phaco/IOL phacoemulsification with intraocular lens placement; [place] photorefractive keratectomy; LASIK laser assisted in situ keratomileusis; HTN hypertension; DM diabetes mellitus; COPD chronic obstructive pulmonary disease

## 2022-06-15 ENCOUNTER — Encounter: Payer: Self-pay | Admitting: Family Medicine

## 2022-06-15 NOTE — Telephone Encounter (Signed)
I dictated a letter please fax this to his transplant coordinator

## 2022-06-15 NOTE — Progress Notes (Signed)
Please fax this to his transplant coordinator

## 2022-06-24 ENCOUNTER — Encounter (INDEPENDENT_AMBULATORY_CARE_PROVIDER_SITE_OTHER): Payer: Self-pay | Admitting: Ophthalmology

## 2022-06-24 ENCOUNTER — Ambulatory Visit (INDEPENDENT_AMBULATORY_CARE_PROVIDER_SITE_OTHER): Payer: Medicare Other | Admitting: Ophthalmology

## 2022-06-24 DIAGNOSIS — I10 Essential (primary) hypertension: Secondary | ICD-10-CM | POA: Diagnosis not present

## 2022-06-24 DIAGNOSIS — H3343 Traction detachment of retina, bilateral: Secondary | ICD-10-CM

## 2022-06-24 DIAGNOSIS — H4313 Vitreous hemorrhage, bilateral: Secondary | ICD-10-CM | POA: Diagnosis not present

## 2022-06-24 DIAGNOSIS — H35033 Hypertensive retinopathy, bilateral: Secondary | ICD-10-CM | POA: Diagnosis not present

## 2022-06-24 DIAGNOSIS — E113513 Type 2 diabetes mellitus with proliferative diabetic retinopathy with macular edema, bilateral: Secondary | ICD-10-CM

## 2022-06-24 DIAGNOSIS — Z961 Presence of intraocular lens: Secondary | ICD-10-CM

## 2022-06-24 MED ORDER — AFLIBERCEPT 2MG/0.05ML IZ SOLN FOR KALEIDOSCOPE
2.0000 mg | INTRAVITREAL | Status: AC | PRN
  Administered 2022-06-24: 2 mg via INTRAVITREAL

## 2022-08-06 ENCOUNTER — Other Ambulatory Visit: Payer: Self-pay

## 2022-08-06 MED ORDER — NOVOLOG FLEXPEN 100 UNIT/ML ~~LOC~~ SOPN: 7.0000 [IU] | PEN_INJECTOR | Freq: Three times a day (TID) | SUBCUTANEOUS | 0 refills | Status: DC

## 2022-08-06 MED ORDER — TRESIBA FLEXTOUCH 100 UNIT/ML ~~LOC~~ SOPN: 22.0000 [IU] | PEN_INJECTOR | Freq: Every day | SUBCUTANEOUS | 0 refills | Status: DC

## 2022-08-26 ENCOUNTER — Ambulatory Visit (INDEPENDENT_AMBULATORY_CARE_PROVIDER_SITE_OTHER): Payer: Medicare Other | Admitting: Ophthalmology

## 2022-08-26 ENCOUNTER — Encounter (INDEPENDENT_AMBULATORY_CARE_PROVIDER_SITE_OTHER): Payer: Self-pay | Admitting: Ophthalmology

## 2022-08-26 DIAGNOSIS — I10 Essential (primary) hypertension: Secondary | ICD-10-CM | POA: Diagnosis not present

## 2022-08-26 DIAGNOSIS — H35033 Hypertensive retinopathy, bilateral: Secondary | ICD-10-CM | POA: Diagnosis not present

## 2022-08-26 DIAGNOSIS — H4313 Vitreous hemorrhage, bilateral: Secondary | ICD-10-CM

## 2022-08-26 DIAGNOSIS — Z961 Presence of intraocular lens: Secondary | ICD-10-CM

## 2022-08-26 DIAGNOSIS — E113513 Type 2 diabetes mellitus with proliferative diabetic retinopathy with macular edema, bilateral: Secondary | ICD-10-CM

## 2022-08-26 DIAGNOSIS — H3343 Traction detachment of retina, bilateral: Secondary | ICD-10-CM

## 2022-08-26 MED ORDER — AFLIBERCEPT 2MG/0.05ML IZ SOLN FOR KALEIDOSCOPE
2.0000 mg | INTRAVITREAL | Status: AC | PRN
  Administered 2022-08-26: 2 mg via INTRAVITREAL

## 2022-08-26 NOTE — Progress Notes (Signed)
[facility] Note  08/26/2022    CHIEF COMPLAINT Patient presents for Retina Follow Up  HISTORY OF PRESENT ILLNESS: Randy Oneal is a 57 y.o. male who presents to the clinic today for:  HPI     Retina Follow Up   Patient presents with  Diabetic Retinopathy.  In both eyes.  This started 9 weeks ago.  I, the attending physician,  performed the HPI with the patient and updated documentation appropriately.        Comments   Patient here for 9 weeks retina follow up for  PDR OU. Patient states vision doing pretty good. The same. No eye pain. Using Prolensa QID OU and Prednisone QID OU.      Last edited by Bernarda Caffey, MD on 08/26/2022 10:26 PM.    Pt states no VA changes noted.   Referring physician: [name], MD [place],  [place] [address]  HISTORICAL INFORMATION:   Selected notes from the MEDICAL RECORD NUMBER Referred by Dr.Mark Gershon Crane for concern of decreased vision post cataract sx   CURRENT MEDICATIONS: Current Outpatient Medications (Ophthalmic Drugs)  Medication Sig   Bromfenac Sodium (PROLENSA) 0.07 % SOLN Place 1 drop into both eyes 4 (four) times daily.   prednisoLONE acetate (PRED FORTE) 1 % ophthalmic suspension PLACE (1) DROP INTO BOTH EYES FOUR TIMES DAILY   No current facility-administered medications for this visit. (Ophthalmic Drugs)   Current Outpatient Medications (Other)  Medication Sig   acetaminophen (TYLENOL) 500 MG tablet Take 1,000 mg by mouth every 6 (six) hours as needed for moderate pain or headache.   amLODipine (NORVASC) 2.5 MG tablet Take 1 tablet (2.5 mg total) by mouth daily.   amLODipine (NORVASC) 5 MG tablet 0.5 tablets (2.5 mg total) daily.   atorvastatin (LIPITOR) 10 MG tablet Take 1 tablet by mouth daily.   blood glucose meter kit and supplies Use to check sugars 3 times daily   Cholecalciferol 1.25 MG (50000 UT) capsule Take by mouth.   cinacalcet (SENSIPAR) 30 MG  tablet Take 30 mg by mouth daily with supper.   Continuous Blood Gluc Sensor (DEXCOM G6 SENSOR) MISC 1 Device by Does not apply route as directed.   Continuous Blood Gluc Transmit (DEXCOM G6 TRANSMITTER) MISC 1 Device by Does not apply route as directed.   glucose blood (ONETOUCH ULTRA) test strip 1 each by Other route in the morning, at noon, in the evening, and at bedtime. Use as instructed   insulin aspart (NOVOLOG FLEXPEN) 100 UNIT/ML FlexPen Inject 7 Units into the skin 3 (three) times daily with meals.   Insulin Pen Needle 32G X 4 MM MISC 1 Device by Does not apply route in the morning, at noon, in the evening, and at bedtime.   lidocaine-prilocaine (EMLA) cream Apply 1 application topically Every Tuesday,Thursday,and Saturday with dialysis.   MICROLET LANCETS MISC USE FOUR TIMES A DAY AS DIRECTED.   nystatin (MYCOSTATIN/NYSTOP) powder Apply topically 2 (two) times daily. To groin and scrotum   sildenafil (VIAGRA) 50 MG tablet Take by mouth.   sucroferric oxyhydroxide (VELPHORO) 500 MG chewable tablet Chew 1,000 mg by mouth 3 (three) times daily with meals.   TRESIBA FLEXTOUCH 100 UNIT/ML FlexTouch Pen Inject 22 Units into the skin at bedtime.   insulin lispro (ADMELOG SOLOSTAR) 100 UNIT/ML KwikPen Inject into the skin. (Patient not taking: Reported on 05/20/2022)   No current facility-administered medications for this visit. (Other)   REVIEW  OF SYSTEMS: ROS   Positive for: Genitourinary, Endocrine, Eyes Negative for: Constitutional, Gastrointestinal, Neurological, Skin, Musculoskeletal, HENT, Cardiovascular, Respiratory, Psychiatric, Allergic/Imm, Heme/Lymph Last edited by Theodore Demark, COA on 08/26/2022  2:21 PM.     ALLERGIES Allergies  Allergen Reactions   Tramadol Nausea And Vomiting   PAST MEDICAL HISTORY Past Medical History:  Diagnosis Date   Anemia    Cataract 07/02/2020   Diabetes mellitus    Type 2    Diabetic retinopathy of both eyes ([place]) 01/31/2019   Dr  Coralyn Pear 01/2019   ESRD (end stage renal disease) ([place])    [place] Frenisus   Herpes simplex virus (HSV) infection    OU   Hypertension    Hypertensive retinopathy    OU   Retinal detachment    OS   Sepsis due to Streptococcus, group B ([place]) 10/05/2018   Past Surgical History:  Procedure Laterality Date   25 GAUGE PARS PLANA VITRECTOMY WITH 20 GAUGE MVR PORT FOR MACULAR HOLE Right 02/24/2019   Procedure: 25 GAUGE PARS PLANA VITRECTOMY WITH 20 GAUGE MVR PORT FOR MACULAR HOLE;  Surgeon: Bernarda Caffey, MD;  Location: [place];  Service: Ophthalmology;  Laterality: Right;   APPLICATION OF WOUND VAC Left 08/27/2018   Procedure: APPLICATION OF WOUND VAC, left neck;  Surgeon: Gaye Pollack, MD;  Location: [place];  Service: Thoracic;  Laterality: Left;   APPLICATION OF WOUND VAC Left 08/30/2018   Procedure: APPLICATION OF WOUND VAC;  Surgeon: Gaye Pollack, MD;  Location: [place];  Service: Thoracic;  Laterality: Left;   AV FISTULA PLACEMENT Left 02/15/2020   Procedure: LEFT ARM ARTERIOVENOUS (AV) FISTULA CREATION;  Surgeon: Waynetta Sandy, MD;  Location: [place];  Service: Vascular;  Laterality: Left;   CATARACT EXTRACTION Bilateral    CATARACT EXTRACTION W/ INTRAOCULAR LENS  IMPLANT, BILATERAL     COLONOSCOPY  06/24/2011   Procedure: COLONOSCOPY;  Surgeon: [name], MD;  [facility];  Service: Endoscopy;  Laterality: N/A;  8:30 AM   EYE SURGERY     GAS/FLUID EXCHANGE Left 03/31/2019   Procedure: Gas/Fluid Exchange;  Surgeon: Bernarda Caffey, MD;  Location: [place];  Service: Ophthalmology;  Laterality: Left;   INJECTION OF SILICONE OIL  8/33/8250   Procedure: Injection Of Silicone Oil;  Surgeon: Bernarda Caffey, MD;  Location: [place];  Service: Ophthalmology;;   INSERTION OF DIALYSIS CATHETER Right 12/05/2019   Procedure: INSERTION OF DIALYSIS CATHETER RIGHT SUBCLAVIAN;  Surgeon: Virl Cagey, MD;  [facility]: [facility];  Service: General;  Laterality: Right;   INSERTION OF  DIALYSIS CATHETER Right 12/07/2019   Procedure: Minor Dialysis catheter in place- need to reposition/ adjust catheter to help with flows;  Surgeon: Virl Cagey, MD;  Location: [facility];  Service: General;  Laterality: Right;  procedure room case with 1% lidocaine, full sterile drape,  towels, full gown, large chloraprep, 4-0 Monocryl and dermabond    INSERTION OF DIALYSIS CATHETER Right 12/08/2019   Procedure: INSERTION OF DIALYSIS CATHETER EXCHANGE;  Surgeon: Virl Cagey, MD;  [facility]: [facility];  Service: General;  Laterality: Right;   IR ANGIOGRAM EXTREMITY RIGHT  03/15/2021   IR RADIOLOGIST EVAL & MGMT  02/07/2021   IR RADIOLOGIST EVAL & MGMT  05/09/2021   IR US GUIDE VASC ACCESS RIGHT  03/15/2021   MEMBRANE PEEL Right 02/24/2019   Procedure: Antoine Primas;  Surgeon: Bernarda Caffey, MD;  Location: [place];  Service: Ophthalmology;  Laterality: Right;   PARS  PLANA VITRECTOMY Left 03/31/2019   Procedure: PARS PLANA VITRECTOMY WITH 25 GAUGE WITH MEMBRANE PEEL;  Surgeon: Bernarda Caffey, MD;  Location: [place];  Service: Ophthalmology;  Laterality: Left;   PHOTOCOAGULATION WITH LASER Right 02/24/2019   Procedure: Photocoagulation With Laser;  Surgeon: Bernarda Caffey, MD;  Location: [place];  Service: Ophthalmology;  Laterality: Right;   PHOTOCOAGULATION WITH LASER Left 03/31/2019   Procedure: Photocoagulation With Laser;  Surgeon: Bernarda Caffey, MD;  Location: [place];  Service: Ophthalmology;  Laterality: Left;   RETINAL DETACHMENT SURGERY Left 03/31/2019   TRD Repair - Dr. Bernarda Caffey   SILICON OIL REMOVAL Right 0/93/2671   Procedure: Silicon Oil Removal;  Surgeon: Bernarda Caffey, MD;  Location: [place];  Service: Ophthalmology;  Laterality: Right;   STERNAL WOUND DEBRIDEMENT Left 08/27/2018   Procedure: Incision and DEBRIDEMENT Left Chest, Neck and Mediastinum;  Surgeon: Gaye Pollack, MD;  [facility];  Service: Thoracic;  Laterality: Left;   STERNAL WOUND DEBRIDEMENT Left 08/30/2018   Procedure:  WOUND VAC CHANGE, LEFT CHEST AND NECK, POSSIBLE DEBRIDEMENT;  Surgeon: Gaye Pollack, MD;  Location[facility];  Service: Thoracic;  Laterality: Left;   FAMILY HISTORY Family History  Problem Relation Age of Onset   Hypertension Mother    Colon cancer Neg Hx    SOCIAL HISTORY Social History   Tobacco Use   Smoking status: Never   Smokeless tobacco: Never  Vaping Use   Vaping Use: Never used  Substance Use Topics   Alcohol use: No   Drug use: No       OPHTHALMIC EXAM:  Base Eye Exam     Visual Acuity (Snellen - Linear)       Right Left   Dist [place] 20/200 -2 20/200   Dist ph [place] NI 20/150 -2         Tonometry (Tonopen, 2:18 PM)       Right Left   Pressure 17 19         Pupils       Dark Light Shape React APD   Right 4 3 Irregular Minimal None   Left 4 3 Irregular Minimal None         Visual Fields       Left Right   Restrictions Partial outer superior temporal, inferior temporal, inferior nasal deficiencies Partial outer superior temporal, inferior temporal deficiencies         Extraocular Movement       Right Left    Full, Ortho Full, Ortho         Neuro/Psych     Oriented x3: Yes   Mood/Affect: Normal         Dilation     Both eyes: 1.0% Mydriacyl, 2.5% Phenylephrine @ 2:18 PM           Slit Lamp and Fundus Exam     Slit Lamp Exam       Right Left   Lids/Lashes mild Meibomian gland dysfunction mild Meibomian gland dysfunction   Conjunctiva/Sclera subconj silicon oil bubbles, Chemosis, conj Cyst White and quiet   Cornea Trace PEE, Well healed cataract wound, arcus, Debris in tear film Trace PEE, Well healed cataract wound, arcus, Debris in tear film   Anterior Chamber Deep and quiet, no cell or flare Deep and quiet, no cell or flare   Iris slightly irregular dilation, posterior synchiae from 3:00-1200 round, focal Posterior synechiae at 1030   Lens PC IOL in good position, pigment deposition, 1+  PCO, mild pigment deposition on  optic PC IOL in good position, mild pigment deposition   Anterior Vitreous Post vit, silicon oil bubble ~24% post vitrectomy, good silicone oil fill         Fundus Exam       Right Left   Disc 2+pallor, sharp rim, mild fibrosis along SN rim +pallor, sharp rim, fine NVD--regressed, mild fibrosis   C/D Ratio 0.2 0.2   Macula attached under oil, scattered MA/IRH -- improved, nasal and central thickening / edema -- persistent-slightly increased attached under oil, persistent fibrosis, edema -- increased, focal bands of PRF with traction emanating from ST macula -- stable, focal DBH temporal macula -- improved   Vessels severe attenuation, Tortuous Attenuated, dilated venules, Tortuous   Periphery Attached, dense 360 PRP laser, mild scattered fibrosis - persistent, Focal ret hole along distal IT arcades--good laser surrounding Attached, good 360 PRP laser changes, pre-retinal fibrosis extending to posterior PRP border superior and inferiorly           IMAGING AND PROCEDURES  Imaging and Procedures for '@TODAY'$ @  OCT, Retina - OU - Both Eyes       Right Eye Quality was good. Central Foveal Thickness: 628. Progression has worsened. Findings include no SRF, abnormal foveal contour, epiretinal membrane, intraretinal fluid, outer retinal atrophy, preretinal fibrosis (Mild interval increase in IRF/edema central and nasal macula ).   Left Eye Quality was good. Central Foveal Thickness: 480. Progression has worsened. Findings include no SRF, abnormal foveal contour, intraretinal hyper-reflective material, epiretinal membrane, intraretinal fluid, preretinal fibrosis (Interval increase in central IRF/IRHM).   Notes *Images captured and stored on drive  Diagnosis / Impression:  +DME OU OD: Mild interval increase in IRF/edema central and nasal macula  OS: Interval increase in central IRF/IRHM  Clinical management:  See below  Abbreviations: NFP - Normal foveal profile. CME - cystoid macular  edema. PED - pigment epithelial detachment. IRF - intraretinal fluid. SRF - subretinal fluid. EZ - ellipsoid zone. ERM - epiretinal membrane. ORA - outer retinal atrophy. ORT - outer retinal tubulation. SRHM - subretinal hyper-reflective material      Intravitreal Injection, Pharmacologic Agent - OD - Right Eye       Time Out 08/26/2022. 3:32 PM. Confirmed correct patient, procedure, site, and patient consented.   Anesthesia Topical anesthesia was used. Anesthetic medications included Lidocaine 2%, Proparacaine 0.5%.   Procedure Preparation included 5% betadine to ocular surface, eyelid speculum. A (32g) needle was used.   Injection: 2 mg aflibercept 2 MG/0.05ML   Route: Intravitreal, Site: Right Eye   NDC: A3590391, Lot: [id number], Expiration date: 11/08/2023, Waste: 0 mL   Post-op Post injection exam found visual acuity of at least counting fingers. The patient tolerated the procedure well. There were no complications. The patient received written and verbal post procedure care education. Post injection medications were not given.      Intravitreal Injection, Pharmacologic Agent - OS - Left Eye       Time Out 08/26/2022. 3:33 PM. Confirmed correct patient, procedure, site, and patient consented.   Anesthesia Topical anesthesia was used. Anesthetic medications included Lidocaine 2%, Proparacaine 0.5%.   Procedure Preparation included 5% betadine to ocular surface, eyelid speculum. A (32g) needle was used.   Injection: 2 mg aflibercept 2 MG/0.05ML   Route: Intravitreal, Site: Left Eye   NDC: A3590391, Lot: [id number], Expiration date: 11/08/2023, Waste: 0 mL   Post-op Post injection exam found visual acuity of at least counting fingers. The patient tolerated the  procedure well. There were no complications. The patient received written and verbal post procedure care education. Post injection medications were not given.            ASSESSMENT/PLAN:    ICD-10-CM    1. Proliferative diabetic retinopathy of both eyes with macular edema associated with type 2 diabetes mellitus (HCC)  E11.3513 OCT, Retina - OU - Both Eyes    Intravitreal Injection, Pharmacologic Agent - OD - Right Eye    Intravitreal Injection, Pharmacologic Agent - OS - Left Eye    aflibercept (EYLEA) SOLN 2 mg    aflibercept (EYLEA) SOLN 2 mg    2. Retinal detachment, tractional, bilateral  H33.43     3. Vitreous hemorrhage of both eyes ([place])  H43.13     4. Essential hypertension  I10     5. Hypertensive retinopathy of both eyes  H35.033     6. Pseudophakia of both eyes  Z96.1      1-3. Proliferative diabetic retinopathy with DME, TRD, and vitreous hemorrhage OU (OD > OS)  - last A1c: 9.6 on 11.11.22  - lost to f/u from Jan 2021 to Oct 2021 due to loss of insurance coverage  - pt with complex medical history with hospitalization in Jan-Feb 2020 for bacteremia and abscess  - history of poor glycemic control for years  - s/p IVA OD #1 (06.20.20), #2 (07.01.20), #3 (09.11.20), #4 (10.09.20), #5 (11.06.20), #6 (11.24.21), #7 (12.22.21), #8 (01.19.22)  - s/p IVA OS #1 (06.20.20), #2 (07.01.20), #3 (08.13.20), #4 (09.11.20)  #5 (11.06.20), #6 (11.24.21), #7 (12.22.21), #8 (01.19.22)  - s/p IVE OU #1 (12.04.20) - sample; #2 (01.06.21), #3 (02.18.22), #4 (03.18.22), #5 (04.18.22), #6 (05.25.22), #7 (07.01.22), #8 (07.29.22), #9 (09.02.22), #10 (09.30.22), #11 (11.04.22), #12 (12.09.23), #13 (01.13.23), #14 (03.02.23), #15 (04.11.23), #16 (06.01.23), #17 (07.11.23), #18 (09.12.23)  - s/p STK OS #1 (10.09.20)  - s/p PRP OS (06.10.20)  - s/p PPV/PFC/EL/FAX/silicone oil OD, 88.50.27  - s/p subconj silicon oil removal OD 8.20.20  - s/p PPV/EL/FAX/silicone oil OS, 74.12.87  - BCVA OD: 20/200 (decreased); OS 20/150 OS (stable)             - OCT: OD: Mild interval increase in central IRF/edema nasal macula; OS: Interval increase in central IRF/IRHM at 9 weeks  - recommend IVE OU #19 for DME  today, 11.14.23 with f/u back to 8 weeks.  - consider possible switch to Vabysmo --approved for next visit.   - RBA of procedure discussed, questions answered  - informed consent obtained and re-signed 06.01.23  - see procedure note             - [employer] benefits investigation begun 01.19.22 -- approved for 2023  - cont topical PF and Prolensa QID OU for possible CME component  - f/u 8 weeks -- DFE/OCT, possible injection  4,5. Hypertensive retinopathy OU  - discussed importance of tight BP control  - monitor  6. Pseudophakia OU  - s/p CE with IOL (Dr. Gershon Crane)  Ophthalmic Meds Ordered this visit:  Meds ordered this encounter  Medications   aflibercept (EYLEA) SOLN 2 mg   aflibercept (EYLEA) SOLN 2 mg     Return in about 8 weeks (around 10/21/2022) for PDR OU, DFE, OCT, likely injections .  There are no Patient Instructions on file for this visit.  This document serves as a record of services personally performed by Gardiner Sleeper, MD, PhD. It was created on their behalf by Renaldo Reel, COT  an ophthalmic technician. The creation of this record is the provider's dictation and/or activities during the visit.    Electronically signed by:  Renaldo Reel, COT  1.15.24 10:27 PM   This document serves as a record of services personally performed by Gardiner Sleeper, MD, PhD. It was created on their behalf by Orvan Falconer, an ophthalmic technician. The creation of this record is the provider's dictation and/or activities during the visit.    Electronically signed by: Orvan Falconer, OA, 08/26/22  10:27 PM   Gardiner Sleeper, M.D., Ph.D. Diseases & Surgery of the Retina and Vitreous [facility]  I have reviewed the above documentation for accuracy and completeness, and I agree with the above. Gardiner Sleeper, M.D., Ph.D. 08/26/22 10:30 PM   Abbreviations: M myopia (nearsighted); A astigmatism; H hyperopia (farsighted); P presbyopia; Mrx  spectacle prescription;  CTL contact lenses; OD right eye; OS left eye; OU both eyes  XT exotropia; ET esotropia; PEK punctate epithelial keratitis; PEE punctate epithelial erosions; DES dry eye syndrome; MGD meibomian gland dysfunction; ATs artificial tears; PFAT's preservative free artificial tears; [place] nuclear sclerotic cataract; PSC posterior subcapsular cataract; ERM epi-retinal membrane; PVD posterior vitreous detachment; RD retinal detachment; DM diabetes mellitus; DR diabetic retinopathy; NPDR non-proliferative diabetic retinopathy; PDR proliferative diabetic retinopathy; CSME clinically significant macular edema; DME diabetic macular edema; dbh dot blot hemorrhages; CWS cotton wool spot; POAG primary open angle glaucoma; C/D cup-to-disc ratio; HVF humphrey visual field; GVF goldmann visual field; OCT optical coherence tomography; IOP intraocular pressure; BRVO Branch retinal vein occlusion; CRVO central retinal vein occlusion; CRAO central retinal artery occlusion; BRAO branch retinal artery occlusion; RT retinal tear; SB scleral buckle; PPV pars plana vitrectomy; VH Vitreous hemorrhage; PRP panretinal laser photocoagulation; IVK intravitreal kenalog; VMT vitreomacular traction; MH Macular hole;  NVD neovascularization of the disc; NVE neovascularization elsewhere; AREDS age related eye disease study; ARMD age related macular degeneration; POAG primary open angle glaucoma; EBMD epithelial/anterior basement membrane dystrophy; ACIOL anterior chamber intraocular lens; IOL intraocular lens; PCIOL posterior chamber intraocular lens; Phaco/IOL phacoemulsification with intraocular lens placement; [place] photorefractive keratectomy; LASIK laser assisted in situ keratomileusis; HTN hypertension; DM diabetes mellitus; COPD chronic obstructive pulmonary disease

## 2022-08-27 ENCOUNTER — Encounter: Payer: Self-pay | Admitting: Internal Medicine

## 2022-08-27 ENCOUNTER — Ambulatory Visit (INDEPENDENT_AMBULATORY_CARE_PROVIDER_SITE_OTHER): Payer: Medicare Other | Admitting: Internal Medicine

## 2022-08-27 VITALS — BP 140/82 | HR 60 | Ht 71.0 in | Wt 196.0 lb

## 2022-08-27 DIAGNOSIS — E1165 Type 2 diabetes mellitus with hyperglycemia: Secondary | ICD-10-CM | POA: Diagnosis not present

## 2022-08-27 DIAGNOSIS — E1142 Type 2 diabetes mellitus with diabetic polyneuropathy: Secondary | ICD-10-CM

## 2022-08-27 DIAGNOSIS — E11319 Type 2 diabetes mellitus with unspecified diabetic retinopathy without macular edema: Secondary | ICD-10-CM

## 2022-08-27 DIAGNOSIS — Z992 Dependence on renal dialysis: Secondary | ICD-10-CM

## 2022-08-27 DIAGNOSIS — E1122 Type 2 diabetes mellitus with diabetic chronic kidney disease: Secondary | ICD-10-CM

## 2022-08-27 DIAGNOSIS — N186 End stage renal disease: Secondary | ICD-10-CM

## 2022-08-27 DIAGNOSIS — Z794 Long term (current) use of insulin: Secondary | ICD-10-CM | POA: Diagnosis not present

## 2022-08-27 LAB — POCT GLYCOSYLATED HEMOGLOBIN (HGB A1C): HbA1c POC (<> result, manual entry): >15 % (ref 4.0–5.6)

## 2022-08-27 LAB — POCT GLUCOSE (DEVICE FOR HOME USE): POC Glucose: 244 mg/dl — AB (ref 70–99)

## 2022-08-27 MED ORDER — INSULIN PEN NEEDLE 32G X 4 MM MISC: 1.0000 | Freq: Four times a day (QID) | 2 refills | Status: DC

## 2022-08-27 MED ORDER — DEXCOM G7 SENSOR MISC: 1.0000 | 3 refills | Status: DC

## 2022-08-27 MED ORDER — TRESIBA FLEXTOUCH 100 UNIT/ML ~~LOC~~ SOPN: 22.0000 [IU] | PEN_INJECTOR | Freq: Every day | SUBCUTANEOUS | 2 refills | Status: DC

## 2022-08-27 MED ORDER — NOVOLOG FLEXPEN 100 UNIT/ML ~~LOC~~ SOPN: PEN_INJECTOR | SUBCUTANEOUS | 2 refills | Status: DC

## 2022-08-27 NOTE — Patient Instructions (Addendum)
Please call the [employer] representative [phone] and look the pump up to see if you like it or not ( [employer] /T slim) insulin pump      - Increase  Tresiba  22 units at Bedtime - Increase  Novolog 8 units before each meal  - Novolog correctional insulin: ADD extra units on insulin to your meal-time Novolog dose if your blood sugars are higher than 170. Use the scale below to help guide you:   Blood sugar before meal Number of units to inject  Less than 170 0 unit  171 -  210 1 units  211 -  250 2 units  251 -  290 3 units  291 -  330 4 units  331 -  370 5 units       HOW TO TREAT LOW BLOOD SUGARS (Blood sugar LESS THAN 70 MG/DL) Please follow the RULE OF 15 for the treatment of hypoglycemia treatment (when your (blood sugars are less than 70 mg/dL)   STEP 1: Take 15 grams of carbohydrates when your blood sugar is low, which includes:  3-4 GLUCOSE TABS  OR 3-4 OZ OF JUICE OR REGULAR SODA OR ONE TUBE OF GLUCOSE GEL    STEP 2: RECHECK blood sugar in 15 MINUTES STEP 3: If your blood sugar is still low at the 15 minute recheck --> then, go back to STEP 1 and treat AGAIN with another 15 grams of carbohydrates. Decrease Tresiba to 24 units at bedtime Decrease Humalog to 7 units 3 times daily with meals,

## 2022-08-27 NOTE — Progress Notes (Signed)
Name: Randy Oneal  Age/ Sex: 57 y.o., male   MRN/ DOB: [id number], 11/21/1965     PCP: [name], MD   Reason for Endocrinology Evaluation: Type 2 Diabetes Mellitus  Initial Endocrine Consultative Visit: 10/20/2018    PATIENT IDENTIFIER: Randy Oneal is a 57 y.o. male with a past medical history of T2DM,HTN, and ESRD  . The patient has followed with Endocrinology clinic since  for consultative assistance with management of his diabetes.     DIABETIC HISTORY:  Mr. Spivak was diagnosed with DM in 2000.  He has been on Metformin, historically his control has been poor due to non-compliance but pt became motivated after his prolonged hospitalization for chest wall infection in [place], 2020.  He has been on insulin therapy for years. His hemoglobin A1c has ranged from 13.0% in 08/2018, peaking at 16.8% in 2017    On his initial presentation to our clinic his A1c 13.0% and he was on Tresiba and Humalog.   SUBJECTIVE:   During the last visit (03/01/2021): A1c 7.9%   Today (08/27/2022): Mr. Brookover is here for a follow up on diabetes. The pt has not been to our clinic in 18 months.  He is accompanied by his wife today. He checks his blood sugars 2 times daily, has not been using dexcom    Denies nausea or vomiting  or diarrhea  Denies recent nocturia  Has a right toe callus , now following with wound clinic   Has been accepted for pancreatic/renal transplant   HD day Monday, Wednesday and Friday     HOME DIABETES REGIMEN:  Tresiba 21 units daily  Novolog 7 units with each meal  -CF: NovoLog(BG -130/40)   Statin: Yes ACE-I/ARB: on ESRD    CONTINUOUS GLUCOSE MONITORING RECORD INTERPRETATION : N/A    DIABETIC COMPLICATIONS: Microvascular complications:  Retinopathy, neuropathy, ESRD Last Eye Exam: Completed 08/26/2022  Macrovascular complications:   Denies: CAD, CVA, PVD   HISTORY:  Past Medical History:  Past Medical History:  Diagnosis Date    Anemia    Cataract 07/02/2020   Diabetes mellitus    Type 2    Diabetic retinopathy of both eyes ([place]) 01/31/2019   Dr Coralyn Pear 01/2019   ESRD (end stage renal disease) ([place])    [place] Frenisus   Herpes simplex virus (HSV) infection    OU   Hypertension    Hypertensive retinopathy    OU   Retinal detachment    OS   Sepsis due to Streptococcus, group B ([place]) 10/05/2018   Past Surgical History:  Past Surgical History:  Procedure Laterality Date   25 GAUGE PARS PLANA VITRECTOMY WITH 20 GAUGE MVR PORT FOR MACULAR HOLE Right 02/24/2019   Procedure: 25 GAUGE PARS PLANA VITRECTOMY WITH 20 GAUGE MVR PORT FOR MACULAR HOLE;  Surgeon: Bernarda Caffey, MD;  Location: [place];  Service: Ophthalmology;  Laterality: Right;   APPLICATION OF WOUND VAC Left 08/27/2018   Procedure: APPLICATION OF WOUND VAC, left neck;  Surgeon: Gaye Pollack, MD;  Location: [place];  Service: Thoracic;  Laterality: Left;   APPLICATION OF WOUND VAC Left 08/30/2018   Procedure: APPLICATION OF WOUND VAC;  Surgeon: Gaye Pollack, MD;  Location: [place];  Service: Thoracic;  Laterality: Left;   AV FISTULA PLACEMENT Left 02/15/2020   Procedure: LEFT ARM ARTERIOVENOUS (AV) FISTULA CREATION;  Surgeon: Waynetta Sandy, MD;  Location: [place];  Service: Vascular;  Laterality: Left;   CATARACT EXTRACTION Bilateral  CATARACT EXTRACTION W/ INTRAOCULAR LENS  IMPLANT, BILATERAL     COLONOSCOPY  06/24/2011   Procedure: COLONOSCOPY;  Surgeon: [name], MD;  [facility];  Service: Endoscopy;  Laterality: N/A;  8:30 AM   EYE SURGERY     GAS/FLUID EXCHANGE Left 03/31/2019   Procedure: Gas/Fluid Exchange;  Surgeon: Bernarda Caffey, MD;  Location: [place];  Service: Ophthalmology;  Laterality: Left;   INJECTION OF SILICONE OIL  5/46/2703   Procedure: Injection Of Silicone Oil;  Surgeon: Bernarda Caffey, MD;  Location: [place];  Service: Ophthalmology;;   INSERTION OF DIALYSIS CATHETER Right 12/05/2019   Procedure: INSERTION OF  DIALYSIS CATHETER RIGHT SUBCLAVIAN;  Surgeon: Virl Cagey, MD;  [facility];  Service: General;  Laterality: Right;   INSERTION OF DIALYSIS CATHETER Right 12/07/2019   Procedure: Minor Dialysis catheter in place- need to reposition/ adjust catheter to help with flows;  Surgeon: Virl Cagey, MD;  [facility];  Service: General;  Laterality: Right;  procedure room case with 1% lidocaine, full sterile drape,  towels, full gown, large chloraprep, 4-0 Monocryl and dermabond    INSERTION OF DIALYSIS CATHETER Right 12/08/2019   Procedure: INSERTION OF DIALYSIS CATHETER EXCHANGE;  Surgeon: Virl Cagey, MD;  [facility];  Service: General;  Laterality: Right;   IR ANGIOGRAM EXTREMITY RIGHT  03/15/2021   IR RADIOLOGIST EVAL & MGMT  02/07/2021   IR RADIOLOGIST EVAL & MGMT  05/09/2021   IR US GUIDE VASC ACCESS RIGHT  03/15/2021   MEMBRANE PEEL Right 02/24/2019   Procedure: Antoine Primas;  Surgeon: Bernarda Caffey, MD;  Location: [place];  Service: Ophthalmology;  Laterality: Right;   PARS PLANA VITRECTOMY Left 03/31/2019   Procedure: PARS PLANA VITRECTOMY WITH 25 GAUGE WITH MEMBRANE PEEL;  Surgeon: Bernarda Caffey, MD;  Location: [place];  Service: Ophthalmology;  Laterality: Left;   PHOTOCOAGULATION WITH LASER Right 02/24/2019   Procedure: Photocoagulation With Laser;  Surgeon: Bernarda Caffey, MD;  Location: [place];  Service: Ophthalmology;  Laterality: Right;   PHOTOCOAGULATION WITH LASER Left 03/31/2019   Procedure: Photocoagulation With Laser;  Surgeon: Bernarda Caffey, MD;  Location: [place];  Service: Ophthalmology;  Laterality: Left;   RETINAL DETACHMENT SURGERY Left 03/31/2019   TRD Repair - Dr. Bernarda Caffey   SILICON OIL REMOVAL Right 5/00/9381   Procedure: Silicon Oil Removal;  Surgeon: Bernarda Caffey, MD;  Location: [place];  Service: Ophthalmology;  Laterality: Right;   STERNAL WOUND DEBRIDEMENT Left 08/27/2018   Procedure: Incision and DEBRIDEMENT Left Chest, Neck and Mediastinum;   Surgeon: Gaye Pollack, MD;  Location: [facility];  Service: Thoracic;  Laterality: Left;   STERNAL WOUND DEBRIDEMENT Left 08/30/2018   Procedure: WOUND VAC CHANGE, LEFT CHEST AND NECK, POSSIBLE DEBRIDEMENT;  Surgeon: Gaye Pollack, MD;  Location: [place] [facility];  Service: Thoracic;  Laterality: Left;   Social History:  reports that he has never smoked. He has never used smokeless tobacco. He reports that he does not drink alcohol and does not use drugs. Family History:  Family History  Problem Relation Age of Onset   Hypertension Mother    Colon cancer Neg Hx      HOME MEDICATIONS: Allergies as of 08/27/2022       Reactions   Tramadol Nausea And Vomiting        Medication List        Accurate as of August 27, 2022  7:50 AM. If you have any questions, ask your nurse  or doctor.          acetaminophen 500 MG tablet Commonly known as: TYLENOL Take 1,000 mg by mouth every 6 (six) hours as needed for moderate pain or headache.   Admelog SoloStar 100 UNIT/ML KwikPen Generic drug: insulin lispro Inject into the skin.   amLODipine 5 MG tablet Commonly known as: NORVASC 0.5 tablets (2.5 mg total) daily.   amLODipine 2.5 MG tablet Commonly known as: NORVASC Take 1 tablet (2.5 mg total) by mouth daily.   atorvastatin 10 MG tablet Commonly known as: LIPITOR Take 1 tablet by mouth daily.   blood glucose meter kit and supplies Use to check sugars 3 times daily   Cholecalciferol 1.25 MG (50000 UT) capsule Take by mouth.   cinacalcet 30 MG tablet Commonly known as: SENSIPAR Take 30 mg by mouth daily with supper.   Dexcom G6 Sensor Misc 1 Device by Does not apply route as directed.   Dexcom G6 Transmitter Misc 1 Device by Does not apply route as directed.   Insulin Pen Needle 32G X 4 MM Misc 1 Device by Does not apply route in the morning, at noon, in the evening, and at bedtime.   lidocaine-prilocaine cream Commonly known as: EMLA Apply 1 application topically Every  Tuesday,Thursday,and Saturday with dialysis.   Microlet Lancets Misc USE FOUR TIMES A DAY AS DIRECTED.   NovoLOG FlexPen 100 UNIT/ML FlexPen Generic drug: insulin aspart Inject 7 Units into the skin 3 (three) times daily with meals.   nystatin powder Commonly known as: MYCOSTATIN/NYSTOP Apply topically 2 (two) times daily. To groin and scrotum   OneTouch Ultra test strip Generic drug: glucose blood 1 each by Other route in the morning, at noon, in the evening, and at bedtime. Use as instructed   prednisoLONE acetate 1 % ophthalmic suspension Commonly known as: PRED FORTE PLACE (1) DROP INTO BOTH EYES FOUR TIMES DAILY   Prolensa 0.07 % Soln Generic drug: Bromfenac Sodium Place 1 drop into both eyes 4 (four) times daily.   sildenafil 50 MG tablet Commonly known as: VIAGRA Take by mouth.   [name] 100 UNIT/ML FlexTouch Pen Generic drug: insulin degludec Inject 22 Units into the skin at bedtime.   Velphoro 500 MG chewable tablet Generic drug: sucroferric oxyhydroxide Chew 1,000 mg by mouth 3 (three) times daily with meals.         OBJECTIVE:   Vital Signs: BP (!) 140/82 (BP Location: Left Arm, Patient Position: Sitting, Cuff Size: Small)   Pulse 60   Ht '5\' 11"'$  (1.803 m)   Wt 196 lb (88.9 kg)   SpO2 96%   BMI 27.34 kg/m   Wt Readings from Last 3 Encounters:  05/20/22 203 lb (92.1 kg)  03/18/22 203 lb (92.1 kg)  03/15/21 192 lb (87.1 kg)     Exam: General: Pt appears well and is in NAD  Lungs: Clear with good BS bilat   Heart: RRR   Abdomen: soft, nontender  Extremities: No pretibial edema.   Neuro: MS is good with appropriate affect, pt is alert and Ox3       DATA REVIEWED:  Lab Results  Component Value Date   HGBA1C 8.5 (A) 10/10/2020   HGBA1C 11.0 (A) 07/11/2020   HGBA1C 8.8 (H) 11/30/2019   Lab Results  Component Value Date   MICROALBUR 3,744.2 (H) 11/30/2019   LDLCALC 110 (H) 10/22/2020   CREATININE 9.27 (H) 03/15/2021    Lab Results  Component Value Date   MICRALBCREAT 4,825 (H)  11/30/2019     Lab Results  Component Value Date   CHOL 186 10/22/2020   HDL 57 10/22/2020   LDLCALC 110 (H) 10/22/2020   TRIG 103 10/22/2020   CHOLHDL 3.3 10/22/2020       A1c 02/13/2021 A1c 7.9%  ASSESSMENT / PLAN / RECOMMENDATIONS:   1) Type 2 Diabetes Mellitus, Poorly Controlled , With Retinopathic, neuropathic complications and ESRD  - Most recent A1c of >15% %. Goal A1c < 7.0 %.    -He has not been to our clinic in a year and a half -An A1c of > 15%, is indicative of INconsistent insulin intake -He has not been checking his glucose on a regular basis, he was on the Dexcom but it was causing pain when he would apply it to the abdomen.  Will prescribe Dexcom G7 -I have recommended an insulin pump, which he is in agreement of, we briefly discussed the technology behind it, I have recommended tandem insulin pump, he was given the helpline to start the authorization process, once this is done I will refer him to our CDE for training -Patient is awaiting pancreas/kidney transplant   MEDICATIONS: -Increase Tresiba 22  units at Bedtime -Increase NovoLog to 8 units 3 times daily before every meal -Restart CF: NovoLog(BG -130/40)    EDUCATION / INSTRUCTIONS: BG monitoring instructions: Patient is instructed to check his blood sugars 3 times a day, before meals. Call [facility] if: BG persistently < 70 I reviewed the Rule of 15 for the treatment of hypoglycemia in detail with the patient. Literature supplied.    2) Diabetic complications:  Eye: Does  have known diabetic retinopathy.  Neuro/ Feet: Does  have known diabetic peripheral neuropathy .  Renal: Patient does ave known baseline CKD. He   is not on an ACEI/ARB at present.     F/U in 6 months   Signed electronically by: Mack Guise, MD  [facility]  [facility] [address] [place], [place] [address] Phone: [phone] FAX: [phone]   CC: [name], [place] [address] Phone: [phone]  Fax: [phone]  Return to Endocrinology clinic as below: Future Appointments  Date Time Provider Welcome  08/27/2022  3:20 PM Tsuyako Jolley, Melanie Crazier, MD [facility] None  10/21/2022  2:00 PM Bernarda Caffey, MD [facility] None  06/02/2023  8:45 AM [facility]-NURSE HEALTH ADVISOR [facility] [place]

## 2022-08-28 ENCOUNTER — Other Ambulatory Visit: Payer: Self-pay

## 2022-08-28 MED ORDER — NOVOLOG FLEXPEN 100 UNIT/ML ~~LOC~~ SOPN: PEN_INJECTOR | SUBCUTANEOUS | 2 refills | Status: DC

## 2022-09-08 ENCOUNTER — Other Ambulatory Visit (INDEPENDENT_AMBULATORY_CARE_PROVIDER_SITE_OTHER): Payer: Self-pay | Admitting: Ophthalmology

## 2022-09-17 ENCOUNTER — Telehealth: Payer: Self-pay | Admitting: *Deleted

## 2022-09-17 NOTE — Patient Outreach (Signed)
  Care Coordination   Initial Visit Note   09/17/2022 Name: Randy Oneal MRN: [id number] DOB: 1965-11-03  Randy Oneal is a 57 y.o. year old male who sees Luking, [name], MD for primary care. I spoke with  Randy Oneal by phone today.  What matters to the patients health and wellness today?  Randy Oneal AGREES TO ENGAGE IN THE HEALTH EQUITY PROGRAM! WE HAVE SCHEDULED HIS IN HOME ASSESSMENT FOR 10/01/22 AT 3:00 PM    Goals Addressed   None     SDOH assessments and interventions completed:  No     Care Coordination Interventions:  No, not indicated   Follow up plan: Follow up call scheduled for FEB 21ST AT 3:00 PM    Encounter Outcome:  Pt. Scheduled   Randy Oneal C. Myrtie Neither, MSN, [facility] Gerontological Nurse Practitioner [facility] Care Management [phone]

## 2022-09-18 ENCOUNTER — Other Ambulatory Visit (INDEPENDENT_AMBULATORY_CARE_PROVIDER_SITE_OTHER): Payer: Self-pay

## 2022-09-18 MED ORDER — PREDNISOLONE ACETATE 1 % OP SUSP: OPHTHALMIC | 3 refills | Status: DC

## 2022-09-18 NOTE — Progress Notes (Signed)
Pt called requesting refill on prednisolone gtts. Sent refill to [facility], pt aware. MS

## 2022-09-30 ENCOUNTER — Telehealth: Payer: Self-pay | Admitting: Nutrition

## 2022-09-30 NOTE — Telephone Encounter (Signed)
Patient Randy Oneal for me to call him to schedule training for G7.  I tried calling him, but not able to leave a message.  Voice mail is full. 3:20PM:  Tried again to contact patient.  Voice mail is full.  No my chart.

## 2022-10-01 ENCOUNTER — Ambulatory Visit: Payer: Self-pay | Admitting: *Deleted

## 2022-10-02 ENCOUNTER — Encounter: Payer: Self-pay | Admitting: *Deleted

## 2022-10-02 ENCOUNTER — Ambulatory Visit (INDEPENDENT_AMBULATORY_CARE_PROVIDER_SITE_OTHER): Payer: Medicare Other | Admitting: Podiatry

## 2022-10-02 DIAGNOSIS — L97512 Non-pressure chronic ulcer of other part of right foot with fat layer exposed: Secondary | ICD-10-CM

## 2022-10-02 NOTE — Progress Notes (Signed)
Subjective:  Patient ID: Randy Oneal, male    DOB: 22-Apr-1966,  MRN: [id number]  Chief Complaint  Patient presents with   Callouses     57 y.o. male presents for wound care.  Patient presents with follow-up to right hallux IPJ ulceration.  Patient states is doing well.  He states has been managing it and has gotten much better.  He denies any other acute complaints he is here to just follow-up make sure things okay Review of Systems: Negative except as noted in the HPI. Denies N/V/F/Ch.  Past Medical History:  Diagnosis Date   Anemia    Cataract 07/02/2020   Diabetes mellitus    Type 2    Diabetic retinopathy of both eyes ([place]) 01/31/2019   Dr Coralyn Pear 01/2019   ESRD (end stage renal disease) ([place])    [place] Frenisus   Herpes simplex virus (HSV) infection    OU   Hypertension    Hypertensive retinopathy    OU   Retinal detachment    OS   Sepsis due to Streptococcus, group B ([place]) 10/05/2018    Current Outpatient Medications:    acetaminophen (TYLENOL) 500 MG tablet, Take 1,000 mg by mouth every 6 (six) hours as needed for moderate pain or headache., Disp: , Rfl:    amLODipine (NORVASC) 2.5 MG tablet, Take 1 tablet (2.5 mg total) by mouth daily., Disp: 90 tablet, Rfl: 1   atorvastatin (LIPITOR) 10 MG tablet, Take 1 tablet by mouth daily., Disp: , Rfl:    blood glucose meter kit and supplies, Use to check sugars 3 times daily, Disp: 1 each, Rfl: 0   Bromfenac Sodium (PROLENSA) 0.07 % SOLN, Place 1 drop into both eyes 4 (four) times daily., Disp: 6 mL, Rfl: 10   Cholecalciferol 1.25 MG (50000 UT) capsule, Take by mouth., Disp: , Rfl:    cinacalcet (SENSIPAR) 30 MG tablet, Take 30 mg by mouth daily with supper., Disp: , Rfl:    Continuous Blood Gluc Sensor (DEXCOM G7 SENSOR) MISC, 1 Device by Does not apply route as directed., Disp: 9 each, Rfl: 3   Continuous Blood Gluc Transmit (DEXCOM G6 TRANSMITTER) MISC, 1 Device by Does not apply route as directed. (Patient not taking:  Reported on 08/27/2022), Disp: 1 each, Rfl: 3   glucose blood (ONETOUCH ULTRA) test strip, 1 each by Other route in the morning, at noon, in the evening, and at bedtime. Use as instructed, Disp: 400 strip, Rfl: 3   insulin aspart (NOVOLOG FLEXPEN) 100 UNIT/ML FlexPen, 7 units with breakfast and lunch, and 8 units with supper CF NovoLog(BG -130/40)  Max daily 45 units, Disp: 45 mL, Rfl: 2   Insulin Pen Needle 32G X 4 MM MISC, 1 Device by Does not apply route in the morning, at noon, in the evening, and at bedtime., Disp: 400 each, Rfl: 2   lidocaine-prilocaine (EMLA) cream, Apply 1 application topically Every Tuesday,Thursday,and Saturday with dialysis., Disp: , Rfl:    MICROLET LANCETS MISC, USE FOUR TIMES A DAY AS DIRECTED., Disp: 100 each, Rfl: 0   nystatin (MYCOSTATIN/NYSTOP) powder, Apply topically 2 (two) times daily. To groin and scrotum, Disp: 15 g, Rfl: 0   prednisoLONE acetate (PRED FORTE) 1 % ophthalmic suspension, PLACE (1) DROP INTO BOTH EYES FOUR TIMES DAILY, Disp: 15 mL, Rfl: 3   sildenafil (VIAGRA) 50 MG tablet, Take by mouth., Disp: , Rfl:    sucroferric oxyhydroxide (VELPHORO) 500 MG chewable tablet, Chew 1,000 mg by mouth 3 (three) times daily  with meals., Disp: , Rfl:    TRESIBA FLEXTOUCH 100 UNIT/ML FlexTouch Pen, Inject 22 Units into the skin at bedtime., Disp: 30 mL, Rfl: 2  Social History   Tobacco Use  Smoking Status Never  Smokeless Tobacco Never    Allergies  Allergen Reactions   Tramadol Nausea And Vomiting   Objective:  There were no vitals filed for this visit. There is no height or weight on file to calculate BMI. Constitutional Well developed. Well nourished.  Vascular Dorsalis pedis pulses faintly palpable bilaterally. Posterior tibial pulses faintly palpable bilaterally. Capillary refill normal to all digits.  No cyanosis or clubbing noted. Pedal hair growth normal.  Neurologic Normal speech. Oriented to person, place, and time. Protective sensation  absent  Dermatologic Wound Location: Right hallux IPJ probing down to bone. No clinical signs of infection including erythema/cellulitis noted.  It is probing down to bone and deeper tissue.  No purulent drainage noted. Wound Base: Mixed Granular/Fibrotic Peri-wound: Macerated Exudate: Scant/small amount Serous exudate Wound Measurements: -See below  Orthopedic: No pain to palpation either foot.   Radiographs: 3 views of skeletally mature the right foot: No acute breakdown of osteomyelitis changes noted to the bone.  Soft tissue ulceration/deficit noted.  No other bony abnormalities identified. 1. Soft tissue wound of the great toe. Mild bone marrow edema at the base of the first distal phalanx concerning for osteomyelitis. No drainable fluid collection to suggest an abscess. 2. Small amount of fluid in the flexor hallucis tendon sheath as can be seen with tenosynovitis which may be secondary to an inflammatory or infectious etiology.   Assessment:   No diagnosis found.      Plan:  Patient was evaluated and treated and all questions answered.  Ulcer right hallux nail probing down to bone -Debridement as below. -Clinically doing well.  Still some superficial ulceration left but has much improved in the last 6 to 8 months  Procedure: Excisional Debridement of Wound~decreasing considerably Tool: Sharp chisel blade/tissue nipper Rationale: Removal of non-viable soft tissue from the wound to promote healing.  Anesthesia: none Pre-Debridement Wound Measurements: 0.5 cm x 0.3 cm x 0.5 cm  Post-Debridement Wound Measurements: 0.6 cm x 0.3 cm x 0.5 cm  Type of Debridement: Sharp Excisional Tissue Removed: Non-viable soft tissue Blood loss: Minimal (<50cc) Depth of Debridement: subcutaneous tissue. Technique: Sharp excisional debridement to bleeding, viable wound base.  Wound Progress: The wound appears to be decreasing considerably Dressing: Dry, sterile, compression  dressing. Disposition: Patient tolerated procedure well. Patient to return in 1 week for follow-up.  No follow-ups on file.

## 2022-10-02 NOTE — Patient Outreach (Signed)
Care Coordination   Home Visit Note   10/02/2022 visit took place 10/01/22. Name: Randy Oneal MRN: [id number] DOB: 02-03-1966  Randy Oneal is a 57 y.o. year old male who sees Luking, [name], MD for primary care. I visited  Randy Oneal in their home today.  What matters to the patients health and wellness today?  Getting my sugar in control and keeping my eye sight.  NFBS at dialysis today was 152.  BP (!) 100/50 (BP Location: Right Arm, Patient Position: Sitting, Cuff Size: Normal)   Pulse 90   Resp 16   Ht 1.803 m (5' 11"$ )   Wt 196 lb (88.9 kg)   SpO2 96%   BMI 27.34 kg/m   Heart: RRR Lungs: Clear Extremities: no edema. Feet: L foot without lesions, good sensation and 2+ pulses                                                   R foot - Great toe plantar surface thick callus with open center but no drainage noted                                                                 There is signiticant edema over the medial metatarsal.      Goals Addressed             This Visit's Progress    I will begin using the DexCom and get comfortable using it over the next 30 days. I will reduce my HgbA1C by 1 gm over the next 3 months (last known A1C 15.0 per endo note from Jan 2024.:       Interventions Today    Flowsheet Row Most Recent Value  Chronic Disease   Chronic disease during today's visit Diabetes  General Interventions   General Interventions Discussed/Reviewed General Interventions Reviewed  [Pt hx reviewed, when dialysis (started 3 years ago), current glucose monitoring practice (done at dialysis), current medication regimen. Retinologist frequently. Referred to podiatry today by NP - URGENT REQUEST.for R great toe wound.]  Exercise Interventions   Exercise Discussed/Reviewed Exercise Reviewed, Physical Activity  Physical Activity Discussed/Reviewed Types of exercise, Physical Activity Reviewed, Home Exercise Program (HEP)  Education Interventions    Education Provided Provided Printed Education, Provided Education  [General diabetes material provided. Verbal education given on goal of A1C <7.]  Provided Verbal Education On Nutrition, Foot Care, Eye Care, Labs, Blood Sugar Monitoring, Mental Health/Coping with Illness, Exercise, Medication, When to see the doctor  Labs Reviewed Hgb A1c, Kidney Function, Lipid Profile  [place] Discussed/Reviewed Mental Health Discussed  Nutrition Interventions   Nutrition Discussed/Reviewed Carbohydrate meal planning  Pharmacy Interventions   Pharmacy Dicussed/Reviewed Medication Adherence  Safety Interventions   Safety Discussed/Reviewed Safety Discussed, Home Safety  Advanced Directive Interventions   Advanced Directives Discussed/Reviewed Advanced Directives Discussed  [Pt reports he has a HCPOA and LW not in chart.]              SDOH assessments and interventions completed:  Yes  SDOH Interventions Today    Flowsheet Row Most  Recent Value  SDOH Interventions   Food Insecurity Interventions Intervention Not Indicated  Housing Interventions Intervention Not Indicated  Transportation Interventions Intervention Not Indicated  [Uses [facility] and family assists.]  Utilities Interventions Intervention Not Indicated  Alcohol Usage Interventions Intervention Not Indicated (Score <7)  Depression Interventions/Treatment  Counseling  [Counseled pt regarding depression and chronic disease and to talk to NP or MD if he becomes more distressed.]  Financial Strain Interventions Intervention Not Indicated  Physical Activity Interventions Intervention Not Indicated  Stress Interventions Intervention Not Indicated  Social Connections Interventions Intervention Not Indicated        Care Coordination Interventions:  Yes, provided  [facility] APPT FOR 10/02/22 AT 1:45. WIFE TO TAKE HIM.  Follow up plan: Follow up call scheduled for fRIDAY 10/03/22    Encounter  Outcome:  Pt. Visit Completed   Kayleen Memos C. Myrtie Neither, MSN, [facility] Gerontological Nurse Practitioner [facility] Care Management [phone]

## 2022-10-03 ENCOUNTER — Ambulatory Visit: Payer: Self-pay | Admitting: *Deleted

## 2022-10-07 NOTE — Progress Notes (Signed)
[facility] Note  10/21/2022    CHIEF COMPLAINT Patient presents for Retina Follow Up  HISTORY OF PRESENT ILLNESS: Randy Oneal is a 57 y.o. male who presents to the clinic today for:  HPI     Retina Follow Up   Patient presents with  Diabetic Retinopathy.  In both eyes.  This started 8 weeks ago.  Duration of 8 weeks.  Since onset it is stable.  I, the attending physician,  performed the HPI with the patient and updated documentation appropriately.        Comments   8 week retina follow up PDR OU and I'VE OU pt is reporting no vision changes noticed pt last blood sugar 142 last A1C 10.5 2 months ago       Last edited by Bernarda Caffey, MD on 10/21/2022  4:07 PM.     Pt states no VA is stable  Referring physician: [name], MD [address] [place],  [place] [address]  HISTORICAL INFORMATION:   Selected notes from the MEDICAL RECORD NUMBER Referred by Dr.Mark Gershon Crane for concern of decreased vision post cataract sx   CURRENT MEDICATIONS: Current Outpatient Medications (Ophthalmic Drugs)  Medication Sig   Bromfenac Sodium (PROLENSA) 0.07 % SOLN Place 1 drop into both eyes 4 (four) times daily.   prednisoLONE acetate (PRED FORTE) 1 % ophthalmic suspension PLACE (1) DROP INTO BOTH EYES FOUR TIMES DAILY   No current facility-administered medications for this visit. (Ophthalmic Drugs)   Current Outpatient Medications (Other)  Medication Sig   acetaminophen (TYLENOL) 500 MG tablet Take 1,000 mg by mouth every 6 (six) hours as needed for moderate pain or headache.   amLODipine (NORVASC) 2.5 MG tablet Take 1 tablet (2.5 mg total) by mouth daily.   atorvastatin (LIPITOR) 10 MG tablet Take 1 tablet by mouth daily.   blood glucose meter kit and supplies Use to check sugars 3 times daily   Cholecalciferol 1.25 MG (50000 UT) capsule Take by mouth.   cinacalcet (SENSIPAR) 30 MG tablet Take 30 mg by mouth daily with supper.    Continuous Blood Gluc Sensor (DEXCOM G7 SENSOR) MISC 1 Device by Does not apply route as directed.   Continuous Blood Gluc Transmit (DEXCOM G6 TRANSMITTER) MISC 1 Device by Does not apply route as directed. (Patient not taking: Reported on 08/27/2022)   glucose blood (ONETOUCH ULTRA) test strip 1 each by Other route in the morning, at noon, in the evening, and at bedtime. Use as instructed   insulin aspart (NOVOLOG FLEXPEN) 100 UNIT/ML FlexPen 7 units with breakfast and lunch, and 8 units with supper CF NovoLog(BG -130/40)  Max daily 45 units   Insulin Pen Needle 32G X 4 MM MISC 1 Device by Does not apply route in the morning, at noon, in the evening, and at bedtime.   lidocaine-prilocaine (EMLA) cream Apply 1 application topically Every Tuesday,Thursday,and Saturday with dialysis.   MICROLET LANCETS MISC USE FOUR TIMES A DAY AS DIRECTED.   nystatin (MYCOSTATIN/NYSTOP) powder Apply topically 2 (two) times daily. To groin and scrotum   sildenafil (VIAGRA) 50 MG tablet Take by mouth.   sucroferric oxyhydroxide (VELPHORO) 500 MG chewable tablet Chew 1,000 mg by mouth 3 (three) times daily with meals.   TRESIBA FLEXTOUCH 100 UNIT/ML FlexTouch Pen Inject 22 Units into the skin at bedtime.   No current facility-administered medications for this visit. (Other)   REVIEW OF SYSTEMS: ROS   Positive for: Genitourinary, Endocrine,  Eyes Negative for: Constitutional, Gastrointestinal, Neurological, Skin, Musculoskeletal, HENT, Cardiovascular, Respiratory, Psychiatric, Allergic/Imm, Heme/Lymph Last edited by Parthenia Ames, COT on 10/21/2022  1:53 PM.     ALLERGIES Allergies  Allergen Reactions   Tramadol Nausea And Vomiting   PAST MEDICAL HISTORY Past Medical History:  Diagnosis Date   Anemia    Cataract 07/02/2020   Diabetes mellitus    Type 2    Diabetic retinopathy of both eyes ([place]) 01/31/2019   Dr Coralyn Pear 01/2019   ESRD (end stage renal disease) ([place])    [place] Frenisus   Herpes  simplex virus (HSV) infection    OU   Hypertension    Hypertensive retinopathy    OU   Retinal detachment    OS   Sepsis due to Streptococcus, group B ([place]) 10/05/2018   Past Surgical History:  Procedure Laterality Date   25 GAUGE PARS PLANA VITRECTOMY WITH 20 GAUGE MVR PORT FOR MACULAR HOLE Right 02/24/2019   Procedure: 25 GAUGE PARS PLANA VITRECTOMY WITH 20 GAUGE MVR PORT FOR MACULAR HOLE;  Surgeon: Bernarda Caffey, MD;  Location: [facility];  Service: Ophthalmology;  Laterality: Right;   APPLICATION OF WOUND VAC Left 08/27/2018   Procedure: APPLICATION OF WOUND VAC, left neck;  Surgeon: Gaye Pollack, MD;  Location: [facility];  Service: Thoracic;  Laterality: Left;   APPLICATION OF WOUND VAC Left 08/30/2018   Procedure: APPLICATION OF WOUND VAC;  Surgeon: Gaye Pollack, MD;  Location: [facility];  Service: Thoracic;  Laterality: Left;   AV FISTULA PLACEMENT Left 02/15/2020   Procedure: LEFT ARM ARTERIOVENOUS (AV) FISTULA CREATION;  Surgeon: Waynetta Sandy, MD;  Location: [facility];  Service: Vascular;  Laterality: Left;   CATARACT EXTRACTION Bilateral    CATARACT EXTRACTION W/ INTRAOCULAR LENS  IMPLANT, BILATERAL     COLONOSCOPY  06/24/2011   Procedure: COLONOSCOPY;  Surgeon: [name], MD;  [facility];  Service: Endoscopy;  Laterality: N/A;  8:30 AM   EYE SURGERY     GAS/FLUID EXCHANGE Left 03/31/2019   Procedure: Gas/Fluid Exchange;  Surgeon: Bernarda Caffey, MD;  Location: [facility];  Service: Ophthalmology;  Laterality: Left;   INJECTION OF SILICONE OIL  [id number]   Procedure: Injection Of Silicone Oil;  Surgeon: Bernarda Caffey, MD;  Location: [facility];  Service: Ophthalmology;;   INSERTION OF DIALYSIS CATHETER Right 12/05/2019   Procedure: INSERTION OF DIALYSIS CATHETER RIGHT SUBCLAVIAN;  Surgeon: Virl Cagey, MD;  [facility]: [facility];  Service: General;  Laterality: Right;   INSERTION OF DIALYSIS CATHETER Right 12/07/2019   Procedure: Minor Dialysis catheter in place- need to  reposition/ adjust catheter to help with flows;  Surgeon: Virl Cagey, MD;  Location: [facility];  Service: General;  Laterality: Right;  procedure room case with 1% lidocaine, full sterile drape,  towels, full gown, large chloraprep, 4-0 Monocryl and dermabond    INSERTION OF DIALYSIS CATHETER Right 12/08/2019   Procedure: INSERTION OF DIALYSIS CATHETER EXCHANGE;  Surgeon: Virl Cagey, MD;  [facility];  Service: General;  Laterality: Right;   IR ANGIOGRAM EXTREMITY RIGHT  03/15/2021   IR RADIOLOGIST EVAL & MGMT  02/07/2021   IR RADIOLOGIST EVAL & MGMT  05/09/2021   IR US GUIDE VASC ACCESS RIGHT  03/15/2021   MEMBRANE PEEL Right 02/24/2019   Procedure: Antoine Primas;  Surgeon: Bernarda Caffey, MD;  Location: [facility];  Service: Ophthalmology;  Laterality: Right;   PARS PLANA VITRECTOMY Left 03/31/2019   Procedure: PARS PLANA  VITRECTOMY WITH 25 GAUGE WITH MEMBRANE PEEL;  Surgeon: Bernarda Caffey, MD;  Location: [facility];  Service: Ophthalmology;  Laterality: Left;   PHOTOCOAGULATION WITH LASER Right 02/24/2019   Procedure: Photocoagulation With Laser;  Surgeon: Bernarda Caffey, MD;  Location: [facility];  Service: Ophthalmology;  Laterality: Right;   PHOTOCOAGULATION WITH LASER Left 03/31/2019   Procedure: Photocoagulation With Laser;  Surgeon: Bernarda Caffey, MD;  Location: [facility];  Service: Ophthalmology;  Laterality: Left;   RETINAL DETACHMENT SURGERY Left 03/31/2019   TRD Repair - Dr. Bernarda Caffey   SILICON OIL REMOVAL Right [id number]   Procedure: Silicon Oil Removal;  Surgeon: Bernarda Caffey, MD;  Location: [facility];  Service: Ophthalmology;  Laterality: Right;   STERNAL WOUND DEBRIDEMENT Left 08/27/2018   Procedure: Incision and DEBRIDEMENT Left Chest, Neck and Mediastinum;  Surgeon: Gaye Pollack, MD;  [facility];  Service: Thoracic;  Laterality: Left;   STERNAL WOUND DEBRIDEMENT Left 08/30/2018   Procedure: WOUND VAC CHANGE, LEFT CHEST AND NECK, POSSIBLE DEBRIDEMENT;  Surgeon: Gaye Pollack, MD;   [facility];  Service: Thoracic;  Laterality: Left;   FAMILY HISTORY Family History  Problem Relation Age of Onset   Hypertension Mother    Colon cancer Neg Hx    SOCIAL HISTORY Social History   Tobacco Use   Smoking status: Never   Smokeless tobacco: Never  Vaping Use   Vaping Use: Never used  Substance Use Topics   Alcohol use: No   Drug use: No       OPHTHALMIC EXAM:  Base Eye Exam     Visual Acuity (Snellen - Linear)       Right Left   Dist [place] 20/200 -1 20/200 -1   Dist ph [place] NI NI         Tonometry (Tonopen, 1:58 PM)       Right Left   Pressure 11 13         Pupils       Pupils Dark Light Shape React APD   Right PERRL 4 3 Round Brisk None   Left PERRL 4 3 Round Brisk None         Visual Fields       Left Right   Restrictions Partial outer superior temporal, inferior temporal, inferior nasal deficiencies Partial outer superior temporal, inferior temporal deficiencies         Neuro/Psych     Oriented x3: Yes   Mood/Affect: Normal         Dilation     Both eyes: 2.5% Phenylephrine @ 1:58 PM           Slit Lamp and Fundus Exam     Slit Lamp Exam       Right Left   Lids/Lashes mild Meibomian gland dysfunction mild Meibomian gland dysfunction   Conjunctiva/Sclera subconj silicon oil bubbles, Chemosis, conj Cyst White and quiet   Cornea Trace PEE, Well healed cataract wound, arcus, Debris in tear film Trace PEE, Well healed cataract wound, arcus, Debris in tear film   Anterior Chamber Deep and quiet, no cell or flare Deep and quiet, no cell or flare   Iris slightly irregular dilation, posterior synchiae from 3:00-1200 round, focal Posterior synechiae at 1030   Lens PC IOL in good position, pigment deposition, 1+ PCO, mild pigment deposition on optic PC IOL in good position, mild pigment deposition   Anterior Vitreous Post vit, silicon oil bubble [id number] post vitrectomy, good silicone oil fill  Fundus Exam       Right  Left   Disc 2+pallor, sharp rim, mild fibrosis along SN rim +pallor, sharp rim, fine NVD--regressed, mild fibrosis   C/D Ratio 0.2 0.2   Macula attached under oil, scattered MA/IRH -- improved, nasal and central thickening / edema -- persistent attached under oil, persistent fibrosis and edema, focal bands of PRF with traction emanating from ST macula -- stable, focal DBH temporal macula -- improved   Vessels attenuated, Tortuous Attenuated, dilated venules, Tortuous   Periphery Attached, dense 360 PRP laser, mild scattered fibrosis - persistent, Focal ret hole along distal IT arcades--good laser surrounding Attached, good 360 PRP laser changes, pre-retinal fibrosis extending to posterior PRP border superior and inferiorly           IMAGING AND PROCEDURES  Imaging and Procedures for '@TODAY'$ @  OCT, Retina - OU - Both Eyes       Right Eye Quality was good. Central Foveal Thickness: 670. Progression has been stable. Findings include no SRF, abnormal foveal contour, epiretinal membrane, intraretinal fluid, outer retinal atrophy, preretinal fibrosis (Persistent IRF/edema central and nasal macula ).   Left Eye Quality was good. Central Foveal Thickness: 528. Progression has been stable. Findings include no SRF, abnormal foveal contour, intraretinal hyper-reflective material, epiretinal membrane, intraretinal fluid, preretinal fibrosis (persistent central IRF/IRHM).   Notes *Images captured and stored on drive  Diagnosis / Impression:  +DME under silicon oil OU OD: persistent IRF/edema central and nasal macula  OS: persistent central IRF/IRHM  Clinical management:  See below  Abbreviations: NFP - Normal foveal profile. CME - cystoid macular edema. PED - pigment epithelial detachment. IRF - intraretinal fluid. SRF - subretinal fluid. EZ - ellipsoid zone. ERM - epiretinal membrane. ORA - outer retinal atrophy. ORT - outer retinal tubulation. SRHM - subretinal hyper-reflective material       Intravitreal Injection, Pharmacologic Agent - OD - Right Eye       Time Out 10/21/2022. 2:58 PM. Confirmed correct patient, procedure, site, and patient consented.   Anesthesia Topical anesthesia was used. Anesthetic medications included Lidocaine 2%, Proparacaine 0.5%.   Procedure Preparation included 5% betadine to ocular surface, eyelid speculum. A (32g) needle was used.   Injection: 2 mg aflibercept 2 MG/0.05ML   Route: Intravitreal, Site: Right Eye   NDC: [id number], Lot: [id number], Expiration date: 06/11/2023, Waste: 0 mL   Post-op Post injection exam found visual acuity of at least counting fingers. The patient tolerated the procedure well. There were no complications. The patient received written and verbal post procedure care education. Post injection medications were not given.      Intravitreal Injection, Pharmacologic Agent - OS - Left Eye       Time Out 10/21/2022. 2:58 PM. Confirmed correct patient, procedure, site, and patient consented.   Anesthesia Topical anesthesia was used. Anesthetic medications included Lidocaine 2%, Proparacaine 0.5%.   Procedure Preparation included 5% betadine to ocular surface, eyelid speculum. A (32g) needle was used.   Injection: 6 mg faricimab-svoa 6 MG/0.05ML   Route: Intravitreal, Site: Left Eye   NDCYD:[id number], Lot: [id number], Expiration date: 10/08/2024, Waste: 0 mL   Post-op Post injection exam found visual acuity of at least counting fingers. The patient tolerated the procedure well. There were no complications. The patient received written and verbal post procedure care education. Post injection medications were not given.            ASSESSMENT/PLAN:    ICD-10-CM   1. Proliferative diabetic retinopathy of both  eyes with macular edema associated with type 2 diabetes mellitus (HCC)  E11.3513 OCT, Retina - OU - Both Eyes    Intravitreal Injection, Pharmacologic Agent - OD - Right Eye    Intravitreal  Injection, Pharmacologic Agent - OS - Left Eye    faricimab-svoa (VABYSMO) '6mg'$ /0.61m intravitreal injection    aflibercept (EYLEA) SOLN 2 mg    2. Retinal detachment, tractional, bilateral  H33.43     3. Vitreous hemorrhage of both [place] ([place]  H43.13     4. Essential hypertension  I10     5. Hypertensive retinopathy of both eyes  H35.033     6. Pseudophakia of both eyes  Z96.1      1-3. Proliferative diabetic retinopathy with DME, TRD, and vitreous hemorrhage OU (OD > OS)  - last A1c: 9.6 on 11.11.22  - lost to f/u from Jan 2021 to Oct 2021 due to loss of insurance coverage  - pt with complex medical history with hospitalization in Jan-Feb 2020 for bacteremia and abscess  - history of poor glycemic control for years  - s/p IVA OD #1 (06.20.20), #2 (07.01.20), #3 (09.11.20), #4 (10.09.20), #5 (11.06.20), #6 (11.24.21), #7 (12.22.21), #8 (01.19.22)  - s/p IVA OS #1 (06.20.20), #2 (07.01.20), #3 (08.13.20), #4 (09.11.20)  #5 (11.06.20), #6 (11.24.21), #7 (12.22.21), #8 (01.19.22) - s/p IVE OU #1 (12.04.20) - sample; #2 (01.06.21), #3 (02.18.22), #4 (03.18.22), #5 (04.18.22), #6 (05.25.22), #7 (07.01.22), #8 (07.29.22), #9 (09.02.22), #10 (09.30.22), #11 (11.04.22), #12 (12.09.23), #13 (01.13.23), #14 (03.02.23), #15 (04.11.23), #16 (06.01.23), #17 (07.11.23), #18 (09.12.23), #19 (11.14.23)  - s/p STK OS #1 (10.09.20)  - s/p PRP OS (06.10.20)  - s/p PPV/PFC/EL/FAX/silicone oil OD, 0123XX123 - s/p subconj silicon oil removal OD 8.20.20  - s/p PPV/EL/FAX/silicone oil OS, 0123XX123 - BCVA OD: 20/200 (decreased); OS 20/150 OS (stable)             - OCT: OD: persistent IRF/edema central and nasal macula; OS: persistent central IRF/IRHM at 8 wks  - discussed IVE resistance and potential benefit of switching medications  - recommend IVE OD #20 and IVV OS #1 for DME today, 03.12.24 with f/u at 6 weeks  - RBA of procedure discussed, questions answered  - IVE informed consent obtained and  re-signed 06.01.23 (OU)  - IVV informed consent obtained and signed, 03.12.23 (OU)  - see procedure note             - Eylea benefits investigation begun 01.19.22 -- approved for 2023  - cont topical PF and Prolensa QID OU for possible CME component  - f/u 6 weeks -- DFE/OCT, possible injection(s)  4,5. Hypertensive retinopathy OU  - discussed importance of tight BP control  - continue to monitor  6. Pseudophakia OU  - s/p CE with IOL (Dr. SGershon Crane  Ophthalmic Meds Ordered this visit:  Meds ordered this encounter  Medications   faricimab-svoa (VABYSMO) '6mg'$ /0.024mintravitreal injection   aflibercept (EYLEA) SOLN 2 mg     Return in about 6 weeks (around 12/02/2022) for f/u PDR OU, DFE, OCT.  There are no Patient Instructions on file for this visit.  This document serves as a record of services personally performed by BrGardiner SleeperMD, PhD. It was created on their behalf by ChRenaldo ReelCOEloyn ophthalmic technician. The creation of this record is the provider's dictation and/or activities during the visit.    Electronically signed by:  ChRenaldo ReelCOT  02.27.24 4:15 PM  This document serves  as a record of services personally performed by Gardiner Sleeper, MD, PhD. It was created on their behalf by San Jetty. Owens Shark, OA an ophthalmic technician. The creation of this record is the provider's dictation and/or activities during the visit.    Electronically signed by: San Jetty. Owens Shark, [place] 03.12.2024 4:15 PM  Gardiner Sleeper, M.D., Ph.D. Diseases & Surgery of the Retina and Vitreous [facility]  I have reviewed the above documentation for accuracy and completeness, and I agree with the above. Gardiner Sleeper, M.D., Ph.D. 10/21/22 4:17 PM  Abbreviations: M myopia (nearsighted); A astigmatism; H hyperopia (farsighted); P presbyopia; Mrx spectacle prescription;  CTL contact lenses; OD right eye; OS left eye; OU both eyes  XT exotropia; ET esotropia;  PEK punctate epithelial keratitis; PEE punctate epithelial erosions; DES dry eye syndrome; MGD meibomian gland dysfunction; ATs artificial tears; PFAT's preservative free artificial tears; [place] nuclear sclerotic cataract; PSC posterior subcapsular cataract; ERM epi-retinal membrane; PVD posterior vitreous detachment; RD retinal detachment; DM diabetes mellitus; DR diabetic retinopathy; NPDR non-proliferative diabetic retinopathy; PDR proliferative diabetic retinopathy; CSME clinically significant macular edema; DME diabetic macular edema; dbh dot blot hemorrhages; CWS cotton wool spot; POAG primary open angle glaucoma; C/D cup-to-disc ratio; HVF humphrey visual field; GVF goldmann visual field; OCT optical coherence tomography; IOP intraocular pressure; BRVO Branch retinal vein occlusion; CRVO central retinal vein occlusion; CRAO central retinal artery occlusion; BRAO branch retinal artery occlusion; RT retinal tear; SB scleral buckle; PPV pars plana vitrectomy; VH Vitreous hemorrhage; PRP panretinal laser photocoagulation; IVK intravitreal kenalog; VMT vitreomacular traction; MH Macular hole;  NVD neovascularization of the disc; NVE neovascularization elsewhere; AREDS age related eye disease study; ARMD age related macular degeneration; POAG primary open angle glaucoma; EBMD epithelial/anterior basement membrane dystrophy; ACIOL anterior chamber intraocular lens; IOL intraocular lens; PCIOL posterior chamber intraocular lens; Phaco/IOL phacoemulsification with intraocular lens placement; [place] photorefractive keratectomy; LASIK laser assisted in situ keratomileusis; HTN hypertension; DM diabetes mellitus; COPD chronic obstructive pulmonary disease

## 2022-10-07 NOTE — Telephone Encounter (Signed)
Tried to schedule appt. For dexcom training.  Not able to leave a message.  Voicemail is full.

## 2022-10-09 ENCOUNTER — Telehealth: Payer: Self-pay | Admitting: Dietician

## 2022-10-09 ENCOUNTER — Encounter: Payer: Self-pay | Admitting: Podiatry

## 2022-10-09 ENCOUNTER — Telehealth: Payer: Self-pay | Admitting: *Deleted

## 2022-10-09 NOTE — Patient Outreach (Signed)
  Care Coordination Follow up podiatry and Dexcom usage.  10/09/2022 Name: Randy Oneal MRN: [id number] DOB: 1965-12-13   Care Coordination Outreach Attempts:  An unsuccessful telephone outreach was attempted today to offer the patient information about available care coordination services as a benefit of their health plan.   Follow Up Plan:  Additional outreach attempts will be made to offer the patient care coordination information and services.   Encounter Outcome:  MAIL BOX WAS FULL No Answer   Care Coordination Interventions:  No, not indicated    SIG Moria Brophy C. Myrtie Neither, MSN, [facility] Gerontological Nurse Practitioner [facility] Care Management [phone]

## 2022-10-09 NOTE — Telephone Encounter (Signed)
Patient called for Dexcom G7.  Appointment made for March 14 due to patients dialysis schedule.  He plans to use a reader.  Antonieta Iba, RD, LDN, CDCES

## 2022-10-13 ENCOUNTER — Telehealth: Payer: Self-pay | Admitting: *Deleted

## 2022-10-13 ENCOUNTER — Encounter: Payer: Self-pay | Admitting: *Deleted

## 2022-10-13 NOTE — Patient Outreach (Signed)
  Care Coordination   10/13/2022 Name: Randy Oneal MRN: [id number] DOB: [birth year]   Care Coordination Outreach Attempts:  A third unsuccessful outreach was attempted today to offer the patient with information about available care coordination services as a benefit of their health plan.  NP will cotinue to outreach as Mr. Fidel is one of the [place] patients. His mailbox and his wife's mailboxes are full. Texted each of them to please have Mr. Poist call me back.  Follow Up Plan:  Additional outreach attempts will be made to offer the patient care coordination information and services.   Encounter Outcome:  No Answer   Care Coordination Interventions:  No, not indicated    SIG Maleko Greulich C. Myrtie Neither, MSN, [facility] Gerontological Nurse Practitioner [facility] Care Management [phone]

## 2022-10-13 NOTE — Patient Outreach (Signed)
  Care Coordination   10/13/2022 Name: Randy Oneal MRN: [id number] DOB: 03/14/1966   Care Coordination Outreach Attempts:  A second unsuccessful outreach was attempted today to offer the patient with information about available care coordination services as a benefit of their health plan.     Follow Up Plan:  Additional outreach attempts will be made to offer the patient care coordination information and services.   Encounter Outcome:  No Answer   Care Coordination Interventions:  No, not indicated    SIG Hodaya Curto C. Myrtie Neither, MSN, [facility] Gerontological Nurse Practitioner [facility] Care Management [phone]

## 2022-10-21 ENCOUNTER — Encounter (INDEPENDENT_AMBULATORY_CARE_PROVIDER_SITE_OTHER): Payer: Self-pay | Admitting: Ophthalmology

## 2022-10-21 ENCOUNTER — Ambulatory Visit (INDEPENDENT_AMBULATORY_CARE_PROVIDER_SITE_OTHER): Payer: Medicare Other | Admitting: Ophthalmology

## 2022-10-21 DIAGNOSIS — H3343 Traction detachment of retina, bilateral: Secondary | ICD-10-CM | POA: Diagnosis not present

## 2022-10-21 DIAGNOSIS — I10 Essential (primary) hypertension: Secondary | ICD-10-CM | POA: Diagnosis not present

## 2022-10-21 DIAGNOSIS — H35033 Hypertensive retinopathy, bilateral: Secondary | ICD-10-CM | POA: Diagnosis not present

## 2022-10-21 DIAGNOSIS — E113513 Type 2 diabetes mellitus with proliferative diabetic retinopathy with macular edema, bilateral: Secondary | ICD-10-CM | POA: Diagnosis not present

## 2022-10-21 DIAGNOSIS — Z961 Presence of intraocular lens: Secondary | ICD-10-CM

## 2022-10-21 DIAGNOSIS — H4313 Vitreous hemorrhage, bilateral: Secondary | ICD-10-CM | POA: Diagnosis not present

## 2022-10-21 MED ORDER — AFLIBERCEPT 2MG/0.05ML IZ SOLN FOR KALEIDOSCOPE
2.0000 mg | INTRAVITREAL | Status: AC | PRN
  Administered 2022-10-21: 2 mg via INTRAVITREAL

## 2022-10-21 MED ORDER — FARICIMAB-SVOA 6 MG/0.05ML IZ SOLN
6.0000 mg | INTRAVITREAL | Status: AC | PRN
  Administered 2022-10-21: 6 mg via INTRAVITREAL

## 2022-10-23 ENCOUNTER — Ambulatory Visit: Payer: Medicare Other | Admitting: Dietician

## 2022-10-24 ENCOUNTER — Other Ambulatory Visit: Payer: Self-pay | Admitting: Internal Medicine

## 2022-10-24 DIAGNOSIS — E1165 Type 2 diabetes mellitus with hyperglycemia: Secondary | ICD-10-CM

## 2022-10-28 ENCOUNTER — Encounter: Payer: Medicare Other | Attending: Endocrinology | Admitting: Nutrition

## 2022-10-28 DIAGNOSIS — Z794 Long term (current) use of insulin: Secondary | ICD-10-CM | POA: Insufficient documentation

## 2022-10-28 DIAGNOSIS — E1165 Type 2 diabetes mellitus with hyperglycemia: Secondary | ICD-10-CM | POA: Diagnosis present

## 2022-10-29 ENCOUNTER — Ambulatory Visit: Payer: Self-pay | Admitting: *Deleted

## 2022-10-29 NOTE — Patient Outreach (Signed)
  Care Coordination   Initial Visit Note   10/29/2022 Name: Randy Oneal MRN: [id number] DOB: 10-29-1965  Randy Oneal is a 57 y.o. year old male who sees No primary care provider on file. for primary care. I spoke with  Randy Oneal by phone today.  What matters to the patients health and wellness today?  Keep progressing on my diabetes mgmt and prevent problems.    Goals Addressed             This Visit's Progress    I will begin using the DexCom and get comfortable using it over the next 30 days. I will reduce my HgbA1C by 1 gm over the next 3 months (last known A1C 15.0 per endo note from Jan 2024.:       Today's HgbA1C at dialysis was 10.0!!!!!!!!!!!  Interventions Today    Flowsheet Row Most Recent Value  Chronic Disease   Chronic disease during today's visit Diabetes  General Interventions   General Interventions Discussed/Reviewed General Interventions Discussed, Annual Foot Exam  [Pt got his DexCom yesterday!!! Initial glucose was 65 fasting and 24 hour average was 147. No value >200. Pt did see podiatrist, had debridement of callus and adjustment of shoe. He is to wash and cleanse open area in center of callus with betadine qd.]  Exercise Interventions   Exercise Discussed/Reviewed Exercise Reviewed, Physical Activity  [Bikes (stationary daily > 1 hour) Has TV regious program he listens to and then in pm exercises with Andy.]  Physical Activity Discussed/Reviewed Types of exercise, Home Exercise Program (HEP)  Education Interventions   Education Provided Provided Education  Provided Verbal Education On Nutrition, Exercise, Foot Care  Nutrition Interventions   Nutrition Discussed/Reviewed Carbohydrate meal planning  [Provided visual sheet for carb identification and serving sizes.]  Safety Interventions   Safety Discussed/Reviewed Safety Reviewed  Advanced Directive Interventions   Advanced Directives Discussed/Reviewed Advanced Directives Discussed, Advanced  Directives Reviewed  [Has the documents and has begun filling them out. Request pt to complete by next home visit.]               SDOH assessments and interventions completed:  Yes   Previously assessed.  Care Coordination Interventions:  Yes, provided   Follow up plan: Follow up call scheduled for April 10th.    Encounter Outcome:  Pt. Visit Completed   Kayleen Memos C. Myrtie Neither, MSN, [facility] Gerontological Nurse Practitioner [facility] Care Management [phone]

## 2022-11-03 NOTE — Progress Notes (Signed)
Patient was trained on the use of the [employer] CGM system.  The readings are going to his reader, because his phone does not support the app.  The reader was set up with date/time and he was shown how to insert, link and read the sensor.  He inserted the sensor into his left arm without difficulty and had no final questions.

## 2022-11-03 NOTE — Patient Instructions (Signed)
Apply and new sensor every 10 days Charge reader every 2 weeks REad over manual  Call Dexcom help line if questions

## 2022-11-18 NOTE — Progress Notes (Signed)
[facility] - Clinic Note  12/02/2022    CHIEF COMPLAINT Patient presents for Retina Follow Up  HISTORY OF PRESENT ILLNESS: Randy Oneal is a 57 y.o. male who presents to the clinic today for:  HPI     Retina Follow Up   Patient presents with  Diabetic Retinopathy.  In both eyes.  This started 6 weeks ago.  Duration of 6 weeks.  Since onset it is stable.  I, the attending physician,  performed the HPI with the patient and updated documentation appropriately.        Comments   6 week retina follow up NPDR OU and I'VE OD and IVV OS pt is reporting vision maybe little better he has noticed when closing his eye he see something spins at times he denies any flashes or floaters his last reading was 135 this am       Last edited by Rennis ChrisZamora, Zamarah Ullmer, MD on 12/02/2022  4:29 PM.    Pt states   Referring physician: Babs SciaraLuking, Scott A, MD [address] [place] [id number]  HISTORICAL INFORMATION:   Selected notes from the MEDICAL RECORD NUMBER Referred by Dr.Mark Nile RiggsShapiro for concern of decreased vision post cataract sx   CURRENT MEDICATIONS: Current Outpatient Medications (Ophthalmic Drugs)  Medication Sig   Bromfenac Sodium (PROLENSA) 0.07 % SOLN Place 1 drop into both eyes 4 (four) times daily.   prednisoLONE acetate (PRED FORTE) 1 % ophthalmic suspension PLACE (1) DROP INTO BOTH EYES FOUR TIMES DAILY   No current facility-administered medications for this visit. (Ophthalmic Drugs)   Current Outpatient Medications (Other)  Medication Sig   acetaminophen (TYLENOL) 500 MG tablet Take 1,000 mg by mouth every 6 (six) hours as needed for moderate pain or headache.   amLODipine (NORVASC) 2.5 MG tablet Take 1 tablet (2.5 mg total) by mouth daily.   atorvastatin (LIPITOR) 10 MG tablet Take 1 tablet by mouth daily.   blood glucose meter kit and supplies Use to check sugars 3 times daily   Cholecalciferol 1.25 MG (50000 UT) capsule Take by mouth.    cinacalcet (SENSIPAR) 30 MG tablet Take 30 mg by mouth daily with supper.   Continuous Blood Gluc Sensor (DEXCOM G7 SENSOR) MISC 1 Device by Does not apply route as directed.   Continuous Blood Gluc Transmit (DEXCOM G6 TRANSMITTER) MISC 1 Device by Does not apply route as directed. (Patient not taking: Reported on 08/27/2022)   glucose blood (ONETOUCH ULTRA) test strip 1 each by Other route in the morning, at noon, in the evening, and at bedtime. Use as instructed   insulin aspart (NOVOLOG FLEXPEN) 100 UNIT/ML FlexPen 7 units with breakfast and lunch, and 8 units with supper CF NovoLog(BG -130/40)  Max daily 45 units   Insulin Pen Needle 32G X 4 MM MISC 1 Device by Does not apply route in the morning, at noon, in the evening, and at bedtime.   lidocaine-prilocaine (EMLA) cream Apply 1 application topically Every Tuesday,Thursday,and Saturday with dialysis.   MICROLET LANCETS MISC USE FOUR TIMES A DAY AS DIRECTED.   nystatin (MYCOSTATIN/NYSTOP) powder Apply topically 2 (two) times daily. To groin and scrotum   sildenafil (VIAGRA) 50 MG tablet Take by mouth.   sucroferric oxyhydroxide (VELPHORO) 500 MG chewable tablet Chew 1,000 mg by mouth 3 (three) times daily with meals.   TRESIBA FLEXTOUCH 100 UNIT/ML FlexTouch Pen Inject 22 Units into the skin at bedtime.   No current facility-administered medications for this  visit. (Other)   REVIEW OF SYSTEMS: ROS   Positive for: Genitourinary, Endocrine, Eyes Negative for: Constitutional, Gastrointestinal, Neurological, Skin, Musculoskeletal, HENT, Cardiovascular, Respiratory, Psychiatric, Allergic/Imm, Heme/Lymph Last edited by Etheleen Mayhew, COT on 12/02/2022  2:26 PM.     ALLERGIES Allergies  Allergen Reactions   Tramadol Nausea And Vomiting   PAST MEDICAL HISTORY Past Medical History:  Diagnosis Date   Anemia    Cataract 07/02/2020   Diabetes mellitus    Type 2    Diabetic retinopathy of both eyes 01/31/2019   [name] 01/2019    ESRD (end stage renal disease)    [place] Frenisus   Herpes simplex virus (HSV) infection    OU   Hypertension    Hypertensive retinopathy    OU   Retinal detachment    OS   Sepsis due to Streptococcus, group B 10/05/2018   Past Surgical History:  Procedure Laterality Date   25 GAUGE PARS PLANA VITRECTOMY WITH 20 GAUGE MVR PORT FOR MACULAR HOLE Right 02/24/2019   Procedure: 25 GAUGE PARS PLANA VITRECTOMY WITH 20 GAUGE MVR PORT FOR MACULAR HOLE;  Surgeon: [name], MD;  Location: [facility] OR;  Service: Ophthalmology;  Laterality: Right;   APPLICATION OF WOUND VAC Left 08/27/2018   Procedure: APPLICATION OF WOUND VAC, left neck;  Surgeon: [name], MD;  Location: [facility] OR;  Service: Thoracic;  Laterality: Left;   APPLICATION OF WOUND VAC Left 08/30/2018   Procedure: APPLICATION OF WOUND VAC;  Surgeon: [name], MD;  Location: [facility] OR;  Service: Thoracic;  Laterality: Left;   AV FISTULA PLACEMENT Left 02/15/2020   Procedure: LEFT ARM ARTERIOVENOUS (AV) FISTULA CREATION;  Surgeon: [name], MD;  Location: [facility] OR;  Service: Vascular;  Laterality: Left;   CATARACT EXTRACTION Bilateral    CATARACT EXTRACTION W/ INTRAOCULAR LENS  IMPLANT, BILATERAL     COLONOSCOPY  06/24/2011   Procedure: COLONOSCOPY;  Surgeon: [name], MD;  Location: [facility] ENDO SUITE;  Service: Endoscopy;  Laterality: N/A;  8:30 AM   EYE SURGERY     GAS/FLUID EXCHANGE Left 03/31/2019   Procedure: Gas/Fluid Exchange;  Surgeon: [name], MD;  Location: [facility] OR;  Service: Ophthalmology;  Laterality: Left;   INJECTION OF SILICONE OIL  02/24/2019   Procedure: Injection Of Silicone Oil;  Surgeon: [name], MD;  Location: [facility] OR;  Service: Ophthalmology;;   INSERTION OF DIALYSIS CATHETER Right 12/05/2019   Procedure: INSERTION OF DIALYSIS CATHETER RIGHT SUBCLAVIAN;  Surgeon: [name], MD;  Location: [facility] ORS;  Service: General;  Laterality: Right;   INSERTION OF DIALYSIS CATHETER Right 12/07/2019    Procedure: Minor Dialysis catheter in place- need to reposition/ adjust catheter to help with flows;  Surgeon: [name], MD;  Location: [facility] ORS;  Service: General;  Laterality: Right;  procedure room case with 1% lidocaine, full sterile drape,  towels, full gown, large chloraprep, 4-0 Monocryl and dermabond    INSERTION OF DIALYSIS CATHETER Right 12/08/2019   Procedure: INSERTION OF DIALYSIS CATHETER EXCHANGE;  Surgeon: [name], MD;  Location: [facility] ORS;  Service: General;  Laterality: Right;   IR ANGIOGRAM EXTREMITY RIGHT  03/15/2021   IR RADIOLOGIST EVAL & MGMT  02/07/2021   IR RADIOLOGIST EVAL & MGMT  05/09/2021   IR US GUIDE VASC ACCESS RIGHT  03/15/2021   MEMBRANE PEEL Right 02/24/2019   Procedure: [place]ula Flax;  Surgeon: [name], MD;  Location: [facility] OR;  Service: Ophthalmology;  Laterality: Right;  PARS PLANA VITRECTOMY Left 03/31/2019   Procedure: PARS PLANA VITRECTOMY WITH 25 GAUGE WITH MEMBRANE PEEL;  Surgeon: [name], MD;  Location: [facility] OR;  Service: Ophthalmology;  Laterality: Left;   PHOTOCOAGULATION WITH LASER Right 02/24/2019   Procedure: Photocoagulation With Laser;  Surgeon: [name], MD;  Location: [facility] OR;  Service: Ophthalmology;  Laterality: Right;   PHOTOCOAGULATION WITH LASER Left 03/31/2019   Procedure: Photocoagulation With Laser;  Surgeon: [name], MD;  Location: [facility] OR;  Service: Ophthalmology;  Laterality: Left;   RETINAL DETACHMENT SURGERY Left 03/31/2019   TRD Repair - Dr. [name]   SILICON OIL REMOVAL Right 03/31/2019   Procedure: Silicon Oil Removal;  Surgeon: [name], MD;  Location: [facility] OR;  Service: Ophthalmology;  Laterality: Right;   STERNAL WOUND DEBRIDEMENT Left 08/27/2018   Procedure: Incision and DEBRIDEMENT Left Chest, Neck and Mediastinum;  Surgeon: [name], MD;  Location: [facility] OR;  Service: Thoracic;  Laterality: Left;   STERNAL WOUND DEBRIDEMENT Left 08/30/2018   Procedure: WOUND VAC CHANGE, LEFT CHEST AND NECK,  POSSIBLE DEBRIDEMENT;  Surgeon: [name], MD;  Location: [facility] OR;  Service: Thoracic;  Laterality: Left;   FAMILY HISTORY Family History  Problem Relation Age of Onset   Hypertension Mother    Colon cancer Neg Hx    SOCIAL HISTORY Social History   Tobacco Use   Smoking status: Never   Smokeless tobacco: Never  Vaping Use   Vaping Use: Never used  Substance Use Topics   Alcohol use: No   Drug use: No       OPHTHALMIC EXAM:  Base Eye Exam     Visual Acuity (Snellen - Linear)       Right Left   Dist [place] 20/200 -1 20/200 -1   Dist ph [place] NI NI         Tonometry (Tonopen, 2:31 PM)       Right Left   Pressure 9 13         Pupils       Pupils Dark Light Shape React APD   Right PERRL 4 3 Round Brisk None   Left PERRL 4 3 Round Brisk None         Visual Fields       Left Right   Restrictions Partial outer superior temporal, inferior temporal, inferior nasal deficiencies Partial outer superior temporal, inferior temporal deficiencies         Neuro/Psych     Oriented x3: Yes   Mood/Affect: Normal         Dilation     Both eyes: 2.5% Phenylephrine @ 2:31 PM           Slit Lamp and Fundus Exam     Slit Lamp Exam       Right Left   Lids/Lashes mild Meibomian gland dysfunction mild Meibomian gland dysfunction   Conjunctiva/Sclera subconj silicon oil bubbles, Chemosis, conj Cyst White and quiet   Cornea Trace PEE, Well healed cataract wound, arcus, Debris in tear film Trace PEE, Well healed cataract wound, arcus, Debris in tear film   Anterior Chamber Deep and quiet, no cell or flare Deep and quiet, no cell or flare   Iris slightly irregular dilation, posterior synchiae from 3:00-1200 round, focal Posterior synechiae at 1030   Lens PC IOL in good position, pigment deposition, 1+ PCO, mild pigment deposition on optic PC IOL in good position, mild pigment deposition   Anterior Vitreous Post vit, silicon oil bubble ~  85% post vitrectomy, good  silicone oil fill         Fundus Exam       Right Left   Disc 2+pallor, sharp rim, mild fibrosis along SN rim +pallor, sharp rim, fine NVD--regressed, mild fibrosis   C/D Ratio 0.2 0.2   Macula attached under oil, scattered MA/IRH -- improved, nasal and central thickening / edema -- persistent attached under oil, persistent fibrosis and edema, focal bands of PRF with traction emanating from ST macula -- stable, focal DBH temporal macula -- improved   Vessels attenuated, Tortuous Attenuated, dilated venules, Tortuous   Periphery Attached, dense 360 PRP laser, mild scattered fibrosis - persistent, Focal ret hole along distal IT arcades--good laser surrounding Attached, good 360 PRP laser changes, pre-retinal fibrosis extending to posterior PRP border superior and inferiorly           IMAGING AND PROCEDURES  Imaging and Procedures for @TODAY @  OCT, Retina - OU - Both Eyes       Right Eye Quality was good. Central Foveal Thickness: 617. Progression has improved. Findings include no SRF, abnormal foveal contour, epiretinal membrane, intraretinal fluid, outer retinal atrophy, preretinal fibrosis (Persistent IRF/edema central and nasal macula -- slightly improved).   Left Eye Quality was good. Central Foveal Thickness: 568. Progression has been stable. Findings include no SRF, abnormal foveal contour, intraretinal hyper-reflective material, epiretinal membrane, intraretinal fluid, preretinal fibrosis (persistent central IRF/IRHM -- slightly increased).   Notes *Images captured and stored on drive  Diagnosis / Impression:  +DME under silicon oil OU OD: persistent IRF/edema central and nasal macula -- slightly improved OS: persistent central IRF/IRHM -- slightly increased  Clinical management:  See below  Abbreviations: NFP - Normal foveal profile. CME - cystoid macular edema. PED - pigment epithelial detachment. IRF - intraretinal fluid. SRF - subretinal fluid. EZ - ellipsoid zone.  ERM - epiretinal membrane. ORA - outer retinal atrophy. ORT - outer retinal tubulation. SRHM - subretinal hyper-reflective material      Intravitreal Injection, Pharmacologic Agent - OD - Right Eye       Time Out 12/02/2022. 3:15 PM. Confirmed correct patient, procedure, site, and patient consented.   Anesthesia Topical anesthesia was used. Anesthetic medications included Lidocaine 2%, Proparacaine 0.5%.   Procedure Preparation included 5% betadine to ocular surface, eyelid speculum. A (32g) needle was used.   Injection: 2 mg aflibercept 2 MG/0.05ML   Route: Intravitreal, Site: Right Eye   NDC: L6038910, Lot: [id number], Expiration date: 12/09/2023, Waste: 0 mL   Post-op Post injection exam found visual acuity of at least counting fingers. The patient tolerated the procedure well. There were no complications. The patient received written and verbal post procedure care education. Post injection medications were not given.      Intravitreal Injection, Pharmacologic Agent - OS - Left Eye       Time Out 12/02/2022. 3:16 PM. Confirmed correct patient, procedure, site, and patient consented.   Anesthesia Topical anesthesia was used. Anesthetic medications included Lidocaine 2%, Proparacaine 0.5%.   Procedure Preparation included 5% betadine to ocular surface, eyelid speculum. A (32g) needle was used.   Injection: 6 mg faricimab-svoa 6 MG/0.05ML   Route: Intravitreal, Site: Left Eye   NDC: O8010301, Lot: [id number], Expiration date: 11/08/2024, Waste: 0 mL   Post-op Post injection exam found visual acuity of at least counting fingers. The patient tolerated the procedure well. There were no complications. The patient received written and verbal post procedure care education. Post injection medications were not given.  ASSESSMENT/PLAN:    ICD-10-CM   1. Proliferative diabetic retinopathy of both eyes with macular edema associated with type 2 diabetes mellitus   E11.3513 OCT, Retina - OU - Both Eyes    Intravitreal Injection, Pharmacologic Agent - OD - Right Eye    Intravitreal Injection, Pharmacologic Agent - OS - Left Eye    faricimab-svoa (VABYSMO) 6mg /0.82mL intravitreal injection    aflibercept (EYLEA) SOLN 2 mg    2. Retinal detachment, tractional, bilateral  H33.43     3. Vitreous hemorrhage of both eyes  H43.13     4. Essential hypertension  I10     5. Hypertensive retinopathy of both eyes  H35.033     6. Pseudophakia of both eyes  Z96.1      1-3. Proliferative diabetic retinopathy with DME, TRD, and vitreous hemorrhage OU (OD > OS)  - last A1c: 9.6 on 11.11.22  - lost to f/u from Jan 2021 to Oct 2021 due to loss of insurance coverage  - pt with complex medical history with hospitalization in Jan-Feb 2020 for bacteremia and abscess  - history of poor glycemic control for years  - s/p IVA OD #1 (06.20.20), #2 (07.01.20), #3 (09.11.20), #4 (10.09.20), #5 (11.06.20), #6 (11.24.21), #7 (12.22.21), #8 (01.19.22)  - s/p IVA OS #1 (06.20.20), #2 (07.01.20), #3 (08.13.20), #4 (09.11.20)  #5 (11.06.20), #6 (11.24.21), #7 (12.22.21), #8 (01.19.22) - s/p IVE OU #1 (12.04.20) - sample; #2 (01.06.21), #3 (02.18.22), #4 (03.18.22), #5 (04.18.22), #6 (05.25.22), #7 (07.01.22), #8 (07.29.22), #9 (09.02.22), #10 (09.30.22), #11 (11.04.22), #12 (12.09.23), #13 (01.13.23), #14 (03.02.23), #15 (04.11.23), #16 (06.01.23), #17 (07.11.23), #18 (09.12.23), #19 (11.14.23) - s/p IVE OD #20 (03.12.24) - s/p IVV OS #1 (03.12.24)  - s/p STK OS #1 (10.09.20)  - s/p PRP OS (06.10.20)  - s/p PPV/PFC/EL/FAX/silicone oil OD, 07.16.20  - s/p subconj silicon oil removal OD 8.20.20  - s/p PPV/EL/FAX/silicone oil OS, 08.20.20  - BCVA OD: 20/200 (stable); OS 20/200 OS (stable)             - OCT shows OD: persistent IRF/edema central and nasal macula -- slightly improved OS: persistent central IRF/IRHM -- slightly increased at 6 weeks  - recommend IVE OD #21 and IVV OS  #2 for DME today, 04.23.24 with f/u at 6 weeks  - RBA of procedure discussed, questions answered  - IVE informed consent obtained and re-signed 06.01.23 (OU)  - IVV informed consent obtained and signed, 03.12.23 (OU)  - see procedure note             - Eylea benefits investigation begun 01.19.22 -- approved for 2023  - cont topical PF and Prolensa QID OU for possible CME component  - f/u 6 weeks -- DFE/OCT, possible injection(s)  4,5. Hypertensive retinopathy OU  - discussed importance of tight BP control  - continue to monitor  6. Pseudophakia OU  - s/p CE with IOL (Dr. Nile Riggs)  Ophthalmic Meds Ordered this visit:  Meds ordered this encounter  Medications   faricimab-svoa (VABYSMO) 6mg /0.8mL intravitreal injection   aflibercept (EYLEA) SOLN 2 mg     Return in about 6 weeks (around 01/13/2023) for f/u PDR OU, DFE, OCT.  There are no Patient Instructions on file for this visit.  This document serves as a record of services personally performed by Karie Chimera, MD, PhD. It was created on their behalf by Gerilyn Nestle, COT an ophthalmic technician. The creation of this record is the provider's dictation and/or activities during the  visit.    Electronically signed by:  Gerilyn Nestle, COT  04.09.24 4:30 PM  Karie Chimera, M.D., Ph.D. Diseases & Surgery of the Retina and Vitreous [facility]  I have reviewed the above documentation for accuracy and completeness, and I agree with the above. Karie Chimera, M.D., Ph.D. 12/02/22 4:31 PM   Abbreviations: M myopia (nearsighted); A astigmatism; H hyperopia (farsighted); P presbyopia; Mrx spectacle prescription;  CTL contact lenses; OD right eye; OS left eye; OU both eyes  XT exotropia; ET esotropia; PEK punctate epithelial keratitis; PEE punctate epithelial erosions; DES dry eye syndrome; MGD meibomian gland dysfunction; ATs artificial tears; PFAT's preservative free artificial tears; NSC nuclear  sclerotic cataract; PSC posterior subcapsular cataract; ERM epi-retinal membrane; PVD posterior vitreous detachment; RD retinal detachment; DM diabetes mellitus; DR diabetic retinopathy; NPDR non-proliferative diabetic retinopathy; PDR proliferative diabetic retinopathy; CSME clinically significant macular edema; DME diabetic macular edema; dbh dot blot hemorrhages; CWS cotton wool spot; POAG primary open angle glaucoma; C/D cup-to-disc ratio; HVF humphrey visual field; GVF goldmann visual field; OCT optical coherence tomography; IOP intraocular pressure; BRVO Branch retinal vein occlusion; CRVO central retinal vein occlusion; CRAO central retinal artery occlusion; BRAO branch retinal artery occlusion; RT retinal tear; SB scleral buckle; PPV pars plana vitrectomy; VH Vitreous hemorrhage; PRP panretinal laser photocoagulation; IVK intravitreal kenalog; VMT vitreomacular traction; MH Macular hole;  NVD neovascularization of the disc; NVE neovascularization elsewhere; AREDS age related eye disease study; ARMD age related macular degeneration; POAG primary open angle glaucoma; EBMD epithelial/anterior basement membrane dystrophy; ACIOL anterior chamber intraocular lens; IOL intraocular lens; PCIOL posterior chamber intraocular lens; Phaco/IOL phacoemulsification with intraocular lens placement; PRK photorefractive keratectomy; LASIK laser assisted in situ keratomileusis; HTN hypertension; DM diabetes mellitus; COPD chronic obstructive pulmonary disease

## 2022-11-19 ENCOUNTER — Other Ambulatory Visit: Payer: Self-pay | Admitting: *Deleted

## 2022-11-28 ENCOUNTER — Telehealth: Payer: Self-pay | Admitting: *Deleted

## 2022-11-28 NOTE — Progress Notes (Signed)
  [employer] [employer] Care Coordination  Note  11/28/2022 Name: [name] MRN: [id number] DOB: [birth year]  Randy Oneal is a 57 y.o. year old male who is a primary care patient of No primary care provider on file. and is actively engaged with the care management team. I reached out to [facility] Oneal by phone today to assist with scheduling a follow up visit with the RN Case Manager  Follow up plan: Unsuccessful telephone outreach attempt made. A HIPAA compliant phone message was left for the patient providing contact information and requesting a return call.   [facility]  Care Coordination Care Guide  Direct Dial: [phone]

## 2022-12-02 ENCOUNTER — Ambulatory Visit (INDEPENDENT_AMBULATORY_CARE_PROVIDER_SITE_OTHER): Payer: Medicare Other | Admitting: Ophthalmology

## 2022-12-02 ENCOUNTER — Encounter (INDEPENDENT_AMBULATORY_CARE_PROVIDER_SITE_OTHER): Payer: Self-pay | Admitting: Ophthalmology

## 2022-12-02 DIAGNOSIS — H4313 Vitreous hemorrhage, bilateral: Secondary | ICD-10-CM

## 2022-12-02 DIAGNOSIS — H35033 Hypertensive retinopathy, bilateral: Secondary | ICD-10-CM

## 2022-12-02 DIAGNOSIS — E113513 Type 2 diabetes mellitus with proliferative diabetic retinopathy with macular edema, bilateral: Secondary | ICD-10-CM | POA: Diagnosis not present

## 2022-12-02 DIAGNOSIS — I10 Essential (primary) hypertension: Secondary | ICD-10-CM | POA: Diagnosis not present

## 2022-12-02 DIAGNOSIS — H3343 Traction detachment of retina, bilateral: Secondary | ICD-10-CM | POA: Diagnosis not present

## 2022-12-02 DIAGNOSIS — Z961 Presence of intraocular lens: Secondary | ICD-10-CM

## 2022-12-02 MED ORDER — AFLIBERCEPT 2MG/0.05ML IZ SOLN FOR KALEIDOSCOPE
2.0000 mg | INTRAVITREAL | Status: AC | PRN
Start: 2022-12-02 — End: 2022-12-02
  Administered 2022-12-02: 2 mg via INTRAVITREAL

## 2022-12-02 MED ORDER — FARICIMAB-SVOA 6 MG/0.05ML IZ SOLN
6.0000 mg | INTRAVITREAL | Status: AC | PRN
  Administered 2022-12-02: 6 mg via INTRAVITREAL

## 2022-12-08 NOTE — Progress Notes (Unsigned)
  [employer] Care Coordination  Note  12/08/2022 Name: [name] MRN: [id number] DOB: Jun 19, 1966  Randy Oneal is a 57 y.o. year old male who is a primary care patient of No primary care provider on file. and is actively engaged with the care management team. I reached out to [facility] Oneal by phone today to assist with re-scheduling a follow up visit with the RN Case Manager  Follow up plan: Unsuccessful telephone outreach attempt made.   [facility]  Care Coordination Care Guide  Direct Dial: [phone]

## 2022-12-10 NOTE — Progress Notes (Signed)
  [employer] Care Coordination Note  12/10/2022 Name: Randy Oneal MRN: [id number] DOB: [birth year]  Randy Oneal is a 57 y.o. year old male who is a primary care patient of No primary care provider on file. and is actively engaged with the care management team. I reached out to Randy Oneal by phone today to assist with scheduling a follow up visit with the RN Case Manager  Follow up plan: Telephone appointment with care management team member scheduled for:12/16/22  [facility] Coordination Care Guide  Direct Dial: [phone]

## 2022-12-16 ENCOUNTER — Encounter: Payer: Self-pay | Admitting: *Deleted

## 2022-12-16 ENCOUNTER — Ambulatory Visit: Payer: Self-pay | Admitting: *Deleted

## 2022-12-16 NOTE — Patient Outreach (Signed)
Care Coordination   Health [employer] Plan Follow Up Visit Note   12/16/2022 Name: Randy Oneal MRN: [id number] DOB: Dec 17, 1965  Randy Oneal is a 57 y.o. year old male who sees No primary care provider on file. for primary care. I spoke with  Randy Oneal by phone today.  What matters to the patients health and wellness today?  Decreasing A1C below 10%    Goals Addressed             This Visit's Progress    COMPLETED: I will begin using the DexCom and get comfortable using it over the next 30 days. I will reduce my HgbA1C by 1 gm over the next 3 months (last known A1C 15.0 per endo note from Jan 2024.:       Today's HgbA1C at dialysis was 10.0!!!!!!!!!!!  Interventions Today    Flowsheet Row Most Recent Value  Chronic Disease   Chronic disease during today's visit Diabetes  General Interventions   General Interventions Discussed/Reviewed General Interventions Discussed, Annual Foot Exam  [Pt got his DexCom yesterday!!! Initial glucose was 65 fasting and 24 hour average was 147. No value >200. Pt did see podiatrist, had debridement of callus and adjustment of shoe. He is to wash and cleanse open area in center of callus with betadine qd.]  Exercise Interventions   Exercise Discussed/Reviewed Exercise Reviewed, Physical Activity  [Bikes (stationary daily > 1 hour) Has TV regious program he listens to and then in pm exercises with Andy.]  Physical Activity Discussed/Reviewed Types of exercise, Home Exercise Program (HEP)  Education Interventions   Education Provided Provided Education  Provided Verbal Education On Nutrition, Exercise, Foot Care  Nutrition Interventions   Nutrition Discussed/Reviewed Carbohydrate meal planning  [Provided visual sheet for carb identification and serving sizes.]  Safety Interventions   Safety Discussed/Reviewed Safety Reviewed  Advanced Directive Interventions   Advanced Directives Discussed/Reviewed Advanced Directives Discussed, Advanced  Directives Reviewed  [Has the documents and has begun filling them out. Request pt to complete by next home visit.]            Over the next 2 months, patient will have an A1C of less than 10%       Care Coordination Goals: Patient will follow-up endocrinologist every 3 months or as recommended Patient will take medication as prescribed and reach out to provider with any negative side effects Patient will continue to monitor and record blood sugar 3 times per day and as needed with Dexcom 7 CGM, and will call endocrinologist with any readings outside of recommended range Patient will take blood sugar log and meter to provider visits for review Patient will follow a modified carbohydrate diet and decrease simple carbohydrates and sugars Patient will remain physically active with a goal of 150 minutes of moderate activity weekly Patient will note how blood sugar is decreased by exercise, for positive reinforcement  Patient will check feet daily for sores, wounds, calluses, etc and will notify provider of any abnormal findings Patient will follow-up with ophthalmologist as recommended to monitor diabetic retinopathy  Patient will reach out to RN Care Coordinator [phone] with any care coordination or resource needs          SDOH assessments and interventions completed:  Yes  SDOH Interventions Today    Flowsheet Row Most Recent Value  SDOH Interventions   Transportation Interventions Intervention Not Indicated  Financial Strain Interventions Intervention Not Indicated        Care Coordination Interventions:  Yes,  provided  Interventions Today    Flowsheet Row Most Recent Value  Chronic Disease   Chronic disease during today's visit Diabetes, Chronic Kidney Disease/End Stage Renal Disease (ESRD)  General Interventions   General Interventions Discussed/Reviewed General Interventions Discussed, General Interventions Reviewed, Annual Eye Exam, Labs, Annual Foot Exam, Doctor  Visits, Durable Medical Equipment (DME)  Labs Hgb A1c every 3 months, Kidney Function  Doctor Visits Discussed/Reviewed Doctor Visits Discussed, Doctor Visits Reviewed, PCP, Specialist  [endocrinologist, opthalmologist, HD on M,W,F, Transplant Team at [facility]]  Durable Medical Equipment (DME) Glucomoter  [dexcom 7 CGM]  PCP/Specialist Visits Compliance with follow-up visit  Exercise Interventions   Exercise Discussed/Reviewed Exercise Discussed, Exercise Reviewed, Physical Activity  Physical Activity Discussed/Reviewed Physical Activity Discussed, Physical Activity Reviewed, Types of exercise, Home Exercise Program (HEP)  Education Interventions   Education Provided Provided Education  Provided Verbal Education On Nutrition, Foot Care, Eye Care, Labs, Blood Sugar Monitoring, Medication, Exercise, When to see the doctor, Mental Health/Coping with Illness  Labs Reviewed Hgb A1c, Kidney Function  [Per patient, last A1C result was between 10% & 11%. Reports home readings around 150 fasting. Feels much more in control with Dexcom and can see the effect that food an exercise have on blood sugar.]  Nutrition Interventions   Nutrition Discussed/Reviewed Nutrition Discussed, Nutrition Reviewed, Carbohydrate meal planning, Portion sizes, Fluid intake, Decreasing sugar intake  Pharmacy Interventions   Pharmacy Dicussed/Reviewed Pharmacy Topics Discussed, Pharmacy Topics Reviewed, Medications and their functions, Medication Adherence  Safety Interventions   Safety Discussed/Reviewed Safety Discussed, Safety Reviewed       Follow up plan: Follow up call scheduled for 01/15/23    Encounter Outcome:  Pt. Visit Completed   Demetrios Loll, BSN, RN-BC RN Care Coordinator [facility]  [facility] Direct Dial: [phone] Main #: [phone]

## 2022-12-30 NOTE — Progress Notes (Signed)
[facility] - Clinic Note  01/13/2023    CHIEF COMPLAINT Patient presents for Retina Follow Up  HISTORY OF PRESENT ILLNESS: Randy Oneal is a 56 y.o. male who presents to the clinic today for:  HPI     Retina Follow Up   Patient presents with  Diabetic Retinopathy.  In both eyes.  Severity is moderate.  Duration of 6 weeks.  Since onset it is stable.  I, the attending physician,  performed the HPI with the patient and updated documentation appropriately.        Comments   Patient states vision the same OU. BS was 214 this am. Last A1c was 8.0 on 06.04.24.      Last edited by [name], MD on 01/13/2023  4:07 PM.    Pt saw his endocrinologist this morning and got a good report  Referring physician: No referring provider defined for this encounter.  HISTORICAL INFORMATION:   Selected notes from the MEDICAL RECORD NUMBER Referred by Dr.[name] for concern of decreased vision post cataract sx   CURRENT MEDICATIONS: Current Outpatient Medications (Ophthalmic Drugs)  Medication Sig   prednisoLONE acetate (PRED FORTE) 1 % ophthalmic suspension PLACE (1) DROP INTO BOTH EYES FOUR TIMES DAILY   Bromfenac Sodium (PROLENSA) 0.07 % SOLN Place 1 drop into both eyes 4 (four) times daily.   No current facility-administered medications for this visit. (Ophthalmic Drugs)   Current Outpatient Medications (Other)  Medication Sig   acetaminophen (TYLENOL) 500 MG tablet Take 1,000 mg by mouth every 6 (six) hours as needed for moderate pain or headache.   amLODipine (NORVASC) 2.5 MG tablet Take 1 tablet (2.5 mg total) by mouth daily.   atorvastatin (LIPITOR) 10 MG tablet Take 1 tablet by mouth daily.   blood glucose meter kit and supplies Use to check sugars 3 times daily   Cholecalciferol 1.25 MG (50000 UT) capsule Take by mouth.   cinacalcet (SENSIPAR) 30 MG tablet Take 30 mg by mouth daily with supper.   Continuous Blood Gluc Sensor (DEXCOM G7 SENSOR) MISC  1 Device by Does not apply route as directed.   glucose blood (ONETOUCH ULTRA) test strip 1 each by Other route in the morning, at noon, in the evening, and at bedtime. Use as instructed   insulin aspart (NOVOLOG FLEXPEN) 100 UNIT/ML FlexPen 7 units with breakfast and lunch, and 8 units with supper CF NovoLog(BG -130/40)  Max daily 45 units   insulin degludec (TRESIBA FLEXTOUCH) 100 UNIT/ML FlexTouch Pen Inject 22 Units into the skin at bedtime.   Insulin Pen Needle 32G X 4 MM MISC 1 Device by Does not apply route in the morning, at noon, in the evening, and at bedtime.   lidocaine-prilocaine (EMLA) cream Apply 1 application topically Every Tuesday,Thursday,and Saturday with dialysis.   MICROLET LANCETS MISC USE FOUR TIMES A DAY AS DIRECTED.   nystatin (MYCOSTATIN/NYSTOP) powder Apply topically 2 (two) times daily. To groin and scrotum   sildenafil (VIAGRA) 50 MG tablet Take by mouth.   sucroferric oxyhydroxide (VELPHORO) 500 MG chewable tablet Chew 1,000 mg by mouth 3 (three) times daily with meals.   No current facility-administered medications for this visit. (Other)   REVIEW OF SYSTEMS: ROS   Positive for: Genitourinary, Endocrine, Eyes Negative for: Constitutional, Gastrointestinal, Neurological, Skin, Musculoskeletal, HENT, Cardiovascular, Respiratory, Psychiatric, Allergic/Imm, Heme/Lymph Last edited by Annalee Genta D, COT on 01/13/2023  1:05 PM.     ALLERGIES Allergies  Allergen Reactions   Tramadol Nausea  And Vomiting   PAST MEDICAL HISTORY Past Medical History:  Diagnosis Date   Anemia    Cataract 07/02/2020   Diabetes mellitus    Type 2    Diabetic retinopathy of both eyes (HCC) 01/31/2019   [name] 01/2019   ESRD (end stage renal disease) (HCC)    [place] Frenisus   Herpes simplex virus (HSV) infection    OU   Hypertension    Hypertensive retinopathy    OU   Retinal detachment    OS   Sepsis due to Streptococcus, group B (HCC) 10/05/2018   Past Surgical  History:  Procedure Laterality Date   25 GAUGE PARS PLANA VITRECTOMY WITH 20 GAUGE MVR PORT FOR MACULAR HOLE Right 02/24/2019   Procedure: 25 GAUGE PARS PLANA VITRECTOMY WITH 20 GAUGE MVR PORT FOR MACULAR HOLE;  Surgeon: [name], MD;  Location: [facility] OR;  Service: Ophthalmology;  Laterality: Right;   APPLICATION OF WOUND VAC Left 08/27/2018   Procedure: APPLICATION OF WOUND VAC, left neck;  Surgeon: [name], MD;  Location: [facility] OR;  Service: Thoracic;  Laterality: Left;   APPLICATION OF WOUND VAC Left 08/30/2018   Procedure: APPLICATION OF WOUND VAC;  Surgeon: [name], MD;  Location: [facility] OR;  Service: Thoracic;  Laterality: Left;   AV FISTULA PLACEMENT Left 02/15/2020   Procedure: LEFT ARM ARTERIOVENOUS (AV) FISTULA CREATION;  Surgeon: [name], MD;  Location: [facility] OR;  Service: Vascular;  Laterality: Left;   CATARACT EXTRACTION Bilateral    CATARACT EXTRACTION W/ INTRAOCULAR LENS  IMPLANT, BILATERAL     COLONOSCOPY  06/24/2011   Procedure: COLONOSCOPY;  Surgeon: [name], MD;  Location: [facility] SUITE;  Service: Endoscopy;  Laterality: N/A;  8:30 AM   EYE SURGERY     GAS/FLUID EXCHANGE Left 03/31/2019   Procedure: Gas/Fluid Exchange;  Surgeon: [name], MD;  Location: [facility] OR;  Service: Ophthalmology;  Laterality: Left;   INJECTION OF SILICONE OIL  02/24/2019   Procedure: Injection Of Silicone Oil;  Surgeon: [name], MD;  Location: [facility] OR;  Service: Ophthalmology;;   INSERTION OF DIALYSIS CATHETER Right 12/05/2019   Procedure: INSERTION OF DIALYSIS CATHETER RIGHT SUBCLAVIAN;  Surgeon: [name], MD;  Location: [facility] ORS;  Service: General;  Laterality: Right;   INSERTION OF DIALYSIS CATHETER Right 12/07/2019   Procedure: Minor Dialysis catheter in place- need to reposition/ adjust catheter to help with flows;  Surgeon: [name], MD;  Location: [facility] ORS;  Service: General;  Laterality: Right;  procedure room case with 1% lidocaine, full sterile  drape,  towels, full gown, large chloraprep, 4-0 Monocryl and dermabond    INSERTION OF DIALYSIS CATHETER Right 12/08/2019   Procedure: INSERTION OF DIALYSIS CATHETER EXCHANGE;  Surgeon: [name], MD;  Location: [facility] ORS;  Service: General;  Laterality: Right;   IR ANGIOGRAM EXTREMITY RIGHT  03/15/2021   IR RADIOLOGIST EVAL & MGMT  02/07/2021   IR RADIOLOGIST EVAL & MGMT  05/09/2021   IR US GUIDE VASC ACCESS RIGHT  03/15/2021   MEMBRANE PEEL Right 02/24/2019   Procedure: [place] Flax;  Surgeon: [name], MD;  Location: [facility] OR;  Service: Ophthalmology;  Laterality: Right;   PARS PLANA VITRECTOMY Left 03/31/2019   Procedure: PARS PLANA VITRECTOMY WITH 25 GAUGE WITH MEMBRANE PEEL;  Surgeon: [name], MD;  Location: [facility] OR;  Service: Ophthalmology;  Laterality: Left;   PHOTOCOAGULATION WITH LASER Right 02/24/2019   Procedure: Photocoagulation With Laser;  Surgeon: [name],  MD;  Location: [facility] OR;  Service: Ophthalmology;  Laterality: Right;   PHOTOCOAGULATION WITH LASER Left 03/31/2019   Procedure: Photocoagulation With Laser;  Surgeon: [name], MD;  Location: [facility] OR;  Service: Ophthalmology;  Laterality: Left;   RETINAL DETACHMENT SURGERY Left 03/31/2019   TRD Repair - Dr. [name]   SILICON OIL REMOVAL Right 03/31/2019   Procedure: Silicon Oil Removal;  Surgeon: [name], MD;  Location: [facility] OR;  Service: Ophthalmology;  Laterality: Right;   STERNAL WOUND DEBRIDEMENT Left 08/27/2018   Procedure: Incision and DEBRIDEMENT Left Chest, Neck and Mediastinum;  Surgeon: [name], MD;  Location: [facility] OR;  Service: Thoracic;  Laterality: Left;   STERNAL WOUND DEBRIDEMENT Left 08/30/2018   Procedure: WOUND VAC CHANGE, LEFT CHEST AND NECK, POSSIBLE DEBRIDEMENT;  Surgeon: [name], MD;  Location: [facility] OR;  Service: Thoracic;  Laterality: Left;   FAMILY HISTORY Family History  Problem Relation Age of Onset   Hypertension Mother    Colon cancer Neg Hx    SOCIAL  HISTORY Social History   Tobacco Use   Smoking status: Never   Smokeless tobacco: Never  Vaping Use   Vaping Use: Never used  Substance Use Topics   Alcohol use: No   Drug use: No       OPHTHALMIC EXAM:  Base Eye Exam     Visual Acuity (Snellen - Linear)       Right Left   Dist [place] 20/200 -2 20/200   Dist ph [place] 20/150 -2 20/150 -2         Tonometry (Tonopen, 1:13 PM)       Right Left   Pressure 19 20         Pupils       Dark Light Shape React APD   Right 4 3 Round Brisk None   Left 4 3 Round Brisk None         Visual Fields (Counting fingers)       Left Right   Restrictions Partial outer superior temporal, inferior temporal, inferior nasal deficiencies Partial outer superior temporal, inferior temporal deficiencies         Extraocular Movement       Right Left    Full, Ortho Full, Ortho         Neuro/Psych     Oriented x3: Yes   Mood/Affect: Normal         Dilation     Both eyes: 1.0% Mydriacyl, 2.5% Phenylephrine @ 1:13 PM           Slit Lamp and Fundus Exam     Slit Lamp Exam       Right Left   Lids/Lashes mild Meibomian gland dysfunction mild Meibomian gland dysfunction   Conjunctiva/Sclera subconj silicon oil bubbles, Chemosis, conj Cyst White and quiet   Cornea Trace PEE, Well healed cataract wound, arcus, Debris in tear film Trace PEE, Well healed cataract wound, arcus, Debris in tear film   Anterior Chamber Deep and quiet, no cell or flare Deep and quiet, no cell or flare   Iris slightly irregular dilation, posterior synchiae from 3:00-1200 round, focal Posterior synechiae at 1030   Lens PC IOL in good position, pigment deposition, 1+ PCO, mild pigment deposition on optic PC IOL in good position, mild pigment deposition   Anterior Vitreous Post vit, silicon oil bubble ~85% post vitrectomy, good silicone oil fill         Fundus Exam  Right Left   Disc 2+pallor, sharp rim, mild fibrosis along SN rim +pallor,  sharp rim, fine NVD--regressed, mild fibrosis   C/D Ratio 0.2 0.2   Macula attached under oil, scattered MA/IRH -- improved, nasal and central thickening / edema -- persistent attached under oil, persistent fibrosis, mild interval improvement in edema, focal bands of PRF with traction emanating from ST macula -- stable, focal DBH temporal macula -- improved   Vessels attenuated, Tortuous Attenuated, dilated venules, Tortuous   Periphery Attached, dense 360 PRP laser, mild scattered fibrosis - persistent, Focal ret hole along distal IT arcades--good laser surrounding Attached, good 360 PRP laser changes, pre-retinal fibrosis extending to posterior PRP border superior and inferiorly           IMAGING AND PROCEDURES  Imaging and Procedures for @TODAY @  OCT, Retina - OU - Both Eyes       Right Eye Quality was good. Central Foveal Thickness: 633. Progression has been stable. Findings include no SRF, abnormal foveal contour, epiretinal membrane, intraretinal fluid, outer retinal atrophy, preretinal fibrosis (Persistent IRF/edema central and nasal macula ).   Left Eye Quality was good. Central Foveal Thickness: 552. Progression has improved. Findings include no SRF, abnormal foveal contour, intraretinal hyper-reflective material, epiretinal membrane, intraretinal fluid, preretinal fibrosis (Mild interval improvement in central IRF/IRHM ).   Notes *Images captured and stored on drive  Diagnosis / Impression:  +DME under silicon oil OU OD: persistent IRF/edema central and nasal macula  OS: Mild interval improvement in central IRF/IRHM   Clinical management:  See below  Abbreviations: NFP - Normal foveal profile. CME - cystoid macular edema. PED - pigment epithelial detachment. IRF - intraretinal fluid. SRF - subretinal fluid. EZ - ellipsoid zone. ERM - epiretinal membrane. ORA - outer retinal atrophy. ORT - outer retinal tubulation. SRHM - subretinal hyper-reflective material       Intravitreal Injection, Pharmacologic Agent - OD - Right Eye       Time Out 01/13/2023. 1:44 PM. Confirmed correct patient, procedure, site, and patient consented.   Anesthesia Topical anesthesia was used. Anesthetic medications included Lidocaine 2%, Proparacaine 0.5%.   Procedure Preparation included 5% betadine to ocular surface, eyelid speculum. A (32g) needle was used.   Injection: 2 mg aflibercept 2 MG/0.05ML   Route: Intravitreal, Site: Right Eye   NDC: L6038910, Lot: [id number], Expiration date: 12/09/2023, Waste: 0 mL   Post-op Post injection exam found visual acuity of at least counting fingers. The patient tolerated the procedure well. There were no complications. The patient received written and verbal post procedure care education. Post injection medications were not given.      Intravitreal Injection, Pharmacologic Agent - OS - Left Eye       Time Out 01/13/2023. 1:45 PM. Confirmed correct patient, procedure, site, and patient consented.   Anesthesia Topical anesthesia was used. Anesthetic medications included Lidocaine 2%, Proparacaine 0.5%.   Procedure Preparation included 5% betadine to ocular surface, eyelid speculum. A (32g) needle was used.   Injection: 6 mg faricimab-svoa 6 MG/0.05ML   Route: Intravitreal, Site: Left Eye   NDC: O8010301, Lot: [id number], Expiration date: 11/08/2024, Waste: 0 mL   Post-op Post injection exam found visual acuity of at least counting fingers. The patient tolerated the procedure well. There were no complications. The patient received written and verbal post procedure care education. Post injection medications were not given.      POCT glycosylated hemoglobin (Hb A1C)      Component Value Flag Ref Range Units Status  Hemoglobin A1C 8.0      4.0 - 5.6 % Final   HbA1c POC (<> result, manual entry)            HbA1c, POC (prediabetic range)            HbA1c, POC (controlled diabetic range)                           ASSESSMENT/PLAN:    ICD-10-CM   1. Proliferative diabetic retinopathy of both eyes with macular edema associated with type 2 diabetes mellitus (HCC)  E11.3513 OCT, Retina - OU - Both Eyes    Intravitreal Injection, Pharmacologic Agent - OD - Right Eye    Intravitreal Injection, Pharmacologic Agent - OS - Left Eye    faricimab-svoa (VABYSMO) 6mg /0.57mL intravitreal injection    aflibercept (EYLEA) SOLN 2 mg    2. Long term (current) use of oral hypoglycemic drugs  Z79.84     3. Long-term (current) use of injectable non-insulin antidiabetic drugs  Z79.85     4. 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 Vitreous hemorrhage of both eyes (HCC)  H43.13     6. Essential hypertension  I10     7. Hypertensive retinopathy of both eyes  H35.033     8. Pseudophakia of both eyes  Z96.1      1-5. Proliferative diabetic retinopathy with DME, TRD, and vitreous hemorrhage OU (OD > OS)  - last A1c: 8.0 on 06.04.24, 9.6 on 11.11.22  - lost to f/u from Jan 2021 to Oct 2021 due to loss of insurance coverage  - pt with complex medical history with hospitalization in Jan-Feb 2020 for bacteremia and abscess  - history of poor glycemic control for years  - s/p IVA OD #1 (06.20.20), #2 (07.01.20), #3 (09.11.20), #4 (10.09.20), #5 (11.06.20), #6 (11.24.21), #7 (12.22.21), #8 (01.19.22)  - s/p IVA OS #1 (06.20.20), #2 (07.01.20), #3 (08.13.20), #4 (09.11.20)  #5 (11.06.20), #6 (11.24.21), #7 (12.22.21), #8 (01.19.22) - s/p IVE OU #1 (12.04.20) - sample; #2 (01.06.21), #3 (02.18.22), #4 (03.18.22), #5 (04.18.22), #6 (05.25.22), #7 (07.01.22), #8 (07.29.22), #9 (09.02.22), #10 (09.30.22), #11 (11.04.22), #12 (12.09.23), #13 (01.13.23), #14 (03.02.23), #15 (04.11.23), #16 (06.01.23), #17 (07.11.23), #18 (09.12.23), #19 (11.14.23) - s/p IVE OD #20 (03.12.24), #21 (04.23.24) - s/p IVV OS #1 (03.12.24), #2 (04.23.24)  - s/p STK OS #1 (10.09.20)  - s/p PRP OS (06.10.20)  - s/p  PPV/PFC/EL/FAX/silicone oil OD, 07.16.20  - s/p subconj silicon oil removal OD 8.20.20  - s/p PPV/EL/FAX/silicone oil OS, 08.20.20  - BCVA OD: 20/150 (improved); OS 20/150 OS (improved)             - OCT shows OD: persistent IRF/edema central and nasal macula; OS: Mild interval improvement in central IRF/IRHM at 6 wks  - recommend IVE OD #22 and IVV OS #3 for DME today, 06.04.24 with f/u in 6 weeks  - RBA of procedure discussed, questions answered  - IVE informed consent obtained and re-signed 06.01.23 (OU)  - IVV informed consent obtained and signed, 03.12.23 (OU)  - see procedure note             - [employer] benefits investigation begun 01.19.22 -- approved for 2023  - cont topical PF and Prolensa QID OU for possible CME component  - paper script for Prolensa given for [employer] patient assistance program  - f/u 6 weeks -- DFE/OCT, possible injection(s)  6,7. Hypertensive  retinopathy OU  - discussed importance of tight BP control  - continue to monitor  8. Pseudophakia OU  - s/p CE with IOL (Dr. Nile Riggs)  Ophthalmic Meds Ordered this visit:  Meds ordered this encounter  Medications   Bromfenac Sodium (PROLENSA) 0.07 % SOLN    Sig: Place 1 drop into both eyes 4 (four) times daily.    Dispense:  6 mL    Refill:  10   faricimab-svoa (VABYSMO) 6mg /0.74mL intravitreal injection   aflibercept (EYLEA) SOLN 2 mg     Return in about 6 weeks (around 02/24/2023) for PDR w/ DME OU, Dilated Exam, OCT, Possible Injxn.  There are no Patient Instructions on file for this visit.  This document serves as a record of services personally performed by Karie Chimera, MD, PhD. It was created on their behalf by Gerilyn Nestle, COT an ophthalmic technician. The creation of this record is the provider's dictation and/or activities during the visit.    Electronically signed by:  Gerilyn Nestle, COT  05.21.24 4:14 PM  This document serves as a record of services personally performed by Karie Chimera,  MD, PhD. It was created on their behalf by Glee Arvin. Manson Passey, OA an ophthalmic technician. The creation of this record is the provider's dictation and/or activities during the visit.    Electronically signed by: Glee Arvin. Manson Passey, [place] 06.04.2024 4:14 PM  Karie Chimera, M.D., Ph.D. Diseases & Surgery of the Retina and Vitreous [facility]  I have reviewed the above documentation for accuracy and completeness, and I agree with the above. Karie Chimera, M.D., Ph.D. 01/13/23 4:15 PM   Abbreviations: M myopia (nearsighted); A astigmatism; H hyperopia (farsighted); P presbyopia; Mrx spectacle prescription;  CTL contact lenses; OD right eye; OS left eye; OU both eyes  XT exotropia; ET esotropia; PEK punctate epithelial keratitis; PEE punctate epithelial erosions; DES dry eye syndrome; MGD meibomian gland dysfunction; ATs artificial tears; PFAT's preservative free artificial tears; NSC nuclear sclerotic cataract; PSC posterior subcapsular cataract; ERM epi-retinal membrane; PVD posterior vitreous detachment; RD retinal detachment; DM diabetes mellitus; DR diabetic retinopathy; NPDR non-proliferative diabetic retinopathy; PDR proliferative diabetic retinopathy; CSME clinically significant macular edema; DME diabetic macular edema; dbh dot blot hemorrhages; CWS cotton wool spot; POAG primary open angle glaucoma; C/D cup-to-disc ratio; HVF humphrey visual field; GVF goldmann visual field; OCT optical coherence tomography; IOP intraocular pressure; BRVO Branch retinal vein occlusion; CRVO central retinal vein occlusion; CRAO central retinal artery occlusion; BRAO branch retinal artery occlusion; RT retinal tear; SB scleral buckle; PPV pars plana vitrectomy; VH Vitreous hemorrhage; PRP panretinal laser photocoagulation; IVK intravitreal kenalog; VMT vitreomacular traction; MH Macular hole;  NVD neovascularization of the disc; NVE neovascularization elsewhere; AREDS age related eye disease study;  ARMD age related macular degeneration; POAG primary open angle glaucoma; EBMD epithelial/anterior basement membrane dystrophy; ACIOL anterior chamber intraocular lens; IOL intraocular lens; PCIOL posterior chamber intraocular lens; Phaco/IOL phacoemulsification with intraocular lens placement; PRK photorefractive keratectomy; LASIK laser assisted in situ keratomileusis; HTN hypertension; DM diabetes mellitus; COPD chronic obstructive pulmonary disease

## 2023-01-13 ENCOUNTER — Encounter (INDEPENDENT_AMBULATORY_CARE_PROVIDER_SITE_OTHER): Payer: Self-pay | Admitting: Ophthalmology

## 2023-01-13 ENCOUNTER — Encounter: Payer: Self-pay | Admitting: Internal Medicine

## 2023-01-13 ENCOUNTER — Ambulatory Visit (INDEPENDENT_AMBULATORY_CARE_PROVIDER_SITE_OTHER): Payer: Medicare Other | Admitting: Internal Medicine

## 2023-01-13 ENCOUNTER — Ambulatory Visit (INDEPENDENT_AMBULATORY_CARE_PROVIDER_SITE_OTHER): Payer: Medicare Other | Admitting: Ophthalmology

## 2023-01-13 VITALS — BP 130/74 | HR 76 | Ht 71.0 in | Wt 199.0 lb

## 2023-01-13 DIAGNOSIS — H3343 Traction detachment of retina, bilateral: Secondary | ICD-10-CM

## 2023-01-13 DIAGNOSIS — H4313 Vitreous hemorrhage, bilateral: Secondary | ICD-10-CM

## 2023-01-13 DIAGNOSIS — Z794 Long term (current) use of insulin: Secondary | ICD-10-CM

## 2023-01-13 DIAGNOSIS — E1165 Type 2 diabetes mellitus with hyperglycemia: Secondary | ICD-10-CM | POA: Diagnosis not present

## 2023-01-13 DIAGNOSIS — Z7985 Long-term (current) use of injectable non-insulin antidiabetic drugs: Secondary | ICD-10-CM | POA: Diagnosis not present

## 2023-01-13 DIAGNOSIS — H35033 Hypertensive retinopathy, bilateral: Secondary | ICD-10-CM

## 2023-01-13 DIAGNOSIS — Z961 Presence of intraocular lens: Secondary | ICD-10-CM

## 2023-01-13 DIAGNOSIS — I10 Essential (primary) hypertension: Secondary | ICD-10-CM

## 2023-01-13 DIAGNOSIS — E113513 Type 2 diabetes mellitus with proliferative diabetic retinopathy with macular edema, bilateral: Secondary | ICD-10-CM | POA: Diagnosis not present

## 2023-01-13 DIAGNOSIS — Z7984 Long term (current) use of oral hypoglycemic drugs: Secondary | ICD-10-CM | POA: Diagnosis not present

## 2023-01-13 LAB — POCT GLYCOSYLATED HEMOGLOBIN (HGB A1C): Hemoglobin A1C: 8 % — AB (ref 4.0–5.6)

## 2023-01-13 MED ORDER — FARICIMAB-SVOA 6 MG/0.05ML IZ SOLN
6.0000 mg | INTRAVITREAL | Status: AC | PRN
Start: 2023-01-13 — End: 2023-01-13
  Administered 2023-01-13: 6 mg via INTRAVITREAL

## 2023-01-13 MED ORDER — BROMFENAC SODIUM 0.07 % OP SOLN: 1.0000 [drp] | Freq: Four times a day (QID) | OPHTHALMIC | 10 refills | Status: DC

## 2023-01-13 MED ORDER — INSULIN PEN NEEDLE 32G X 4 MM MISC: 1.0000 | Freq: Four times a day (QID) | 2 refills | Status: DC

## 2023-01-13 MED ORDER — NOVOLOG FLEXPEN 100 UNIT/ML ~~LOC~~ SOPN: PEN_INJECTOR | SUBCUTANEOUS | 2 refills | Status: DC

## 2023-01-13 MED ORDER — TRESIBA FLEXTOUCH 100 UNIT/ML ~~LOC~~ SOPN: 22.0000 [IU] | PEN_INJECTOR | Freq: Every day | SUBCUTANEOUS | 2 refills | Status: DC

## 2023-01-13 MED ORDER — AFLIBERCEPT 2MG/0.05ML IZ SOLN FOR KALEIDOSCOPE
2.0000 mg | INTRAVITREAL | Status: AC | PRN
Start: 2023-01-13 — End: 2023-01-13
  Administered 2023-01-13: 2 mg via INTRAVITREAL

## 2023-01-13 NOTE — Patient Instructions (Addendum)
-   Continue Tresiba  22 units at Bedtime - Increase  Novolog 8 units before each meal  - Novolog correctional insulin: ADD extra units on insulin to your meal-time Novolog dose if your blood sugars are higher than 170. Use the scale below to help guide you:   Blood sugar before meal Number of units to inject  Less than 170 0 unit  171 -  210 1 units  211 -  250 2 units  251 -  290 3 units  291 -  330 4 units  331 -  370 5 units       HOW TO TREAT LOW BLOOD SUGARS (Blood sugar LESS THAN 70 MG/DL) Please follow the RULE OF 15 for the treatment of hypoglycemia treatment (when your (blood sugars are less than 70 mg/dL)   STEP 1: Take 15 grams of carbohydrates when your blood sugar is low, which includes:  3-4 GLUCOSE TABS  OR 3-4 OZ OF JUICE OR REGULAR SODA OR ONE TUBE OF GLUCOSE GEL    STEP 2: RECHECK blood sugar in 15 MINUTES STEP 3: If your blood sugar is still low at the 15 minute recheck --> then, go back to STEP 1 and treat AGAIN with another 15 grams of carbohydrates. Decrease Tresiba to 24 units at bedtime Decrease Humalog to 7 units 3 times daily with meals,

## 2023-01-13 NOTE — Progress Notes (Signed)
Name: Randy Oneal  Age/ Sex: 57 y.o., male   MRN/ DOB: [id number], 13-Mar-1966     PCP: [name], MD   Reason for Endocrinology Evaluation: Type 2 Diabetes Mellitus  Initial Endocrine Consultative Visit: 10/20/2018    PATIENT IDENTIFIER: Randy Oneal is a 57 y.o. male with a past medical history of T2DM,HTN, and ESRD  . The patient has followed with Endocrinology clinic since  for consultative assistance with management of his diabetes.     DIABETIC HISTORY:  Randy Oneal was diagnosed with DM in 2000.  He has been on Metformin, historically his control has been poor due to non-compliance but pt became motivated after his prolonged hospitalization for chest wall infection in [place], 2020.  He has been on insulin therapy for years. His hemoglobin A1c has ranged from 13.0% in 08/2018, peaking at 16.8% in 2017    On his initial presentation to our clinic his A1c 13.0% and he was on Tresiba and Humalog.   SUBJECTIVE:   During the last visit (03/01/2021): A1c >15%   Today (01/13/2023): Randy Oneal is here for a follow up on diabetes.  He is accompanied by his wife today. He checks his blood sugars multiple times daily through Dexcom G7.    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, receives intravitreal injections  Has been accepted for pancreatic/renal transplant , scheduled for a f/u in 02/2023  HD day Monday, Wednesday and Friday   Denies nausea or vomiting  Denies constipation or diarrhea   HOME DIABETES REGIMEN:  Tresiba 22 units daily  Novolog 8 units with each meal - takes 7-8 units  CF: NovoLog(BG -130/40)   Statin: Yes ACE-I/ARB: on ESRD    CONTINUOUS GLUCOSE MONITORING RECORD INTERPRETATION    Dates of Recording: 5/22-01/13/2023  Sensor description:dexcom  Results statistics:   CGM use % of time 93  Average and SD 187/64  Time in range      53  %  % Time Above 180 31  % Time  above 250 15  % Time Below target <1   Glycemic patterns summary: BG's are optimal at night and fluctuate during the day  Hyperglycemic episodes postprandial  Hypoglycemic episodes occurred where after bolus  Overnight periods: Stable     DIABETIC COMPLICATIONS: Microvascular complications:  Retinopathy, neuropathy, ESRD Last Eye Exam: Completed 12/02/2022  Macrovascular complications:   Denies: CAD, CVA, PVD   HISTORY:  Past Medical History:  Past Medical History:  Diagnosis Date   Anemia    Cataract 07/02/2020   Diabetes mellitus    Type 2    Diabetic retinopathy of both eyes (HCC) 01/31/2019   [name] 01/2019   ESRD (end stage renal disease) (HCC)    [place] Frenis[place]   Herpes simplex virus (HSV) infection    OU   Hypertension    Hypertensive retinopathy    OU   Retinal detachment    OS   Sepsis due to Streptococcus, group B (HCC) 10/05/2018   Past Surgical History:  Past Surgical History:  Procedure Laterality Date   25 GAUGE PARS PLANA VITRECTOMY WITH 20 GAUGE MVR PORT FOR MACULAR HOLE Right 02/24/2019   Procedure: 25 GAUGE PARS PLANA VITRECTOMY WITH 20 GAUGE MVR PORT FOR MACULAR HOLE;  Surgeon: [name], MD;  Location: [facility] OR;  Service: Ophthalmology;  Laterality: Right;   APPLICATION OF WOUND VAC Left 08/27/2018   Procedure: APPLICATION OF WOUND VAC, left neck;  Surgeon: [name], MD;  Location: [facility] OR;  Service: Thoracic;  Laterality: Left;   APPLICATION OF WOUND VAC Left 08/30/2018   Procedure: APPLICATION OF WOUND VAC;  Surgeon: [name], MD;  Location: [facility] OR;  Service: Thoracic;  Laterality: Left;   AV FISTULA PLACEMENT Left 02/15/2020   Procedure: LEFT ARM ARTERIOVENOUS (AV) FISTULA CREATION;  Surgeon: [name], MD;  Location: [facility] OR;  Service: Vascular;  Laterality: Left;   CATARACT EXTRACTION Bilateral    CATARACT EXTRACTION W/ INTRAOCULAR LENS  IMPLANT, BILATERAL     COLONOSCOPY  06/24/2011   Procedure: COLONOSCOPY;   Surgeon: [name], MD;  Location: [facility] SUITE;  Service: Endoscopy;  Laterality: N/A;  8:30 AM   EYE SURGERY     GAS/FLUID EXCHANGE Left 03/31/2019   Procedure: Gas/Fluid Exchange;  Surgeon: [name], MD;  Location: [facility] OR;  Service: Ophthalmology;  Laterality: Left;   INJECTION OF SILICONE OIL  02/24/2019   Procedure: Injection Of Silicone Oil;  Surgeon: [name], MD;  Location: [facility] OR;  Service: Ophthalmology;;   INSERTION OF DIALYSIS CATHETER Right 12/05/2019   Procedure: INSERTION OF DIALYSIS CATHETER RIGHT SUBCLAVIAN;  Surgeon: [name], MD;  Location: [facility] ORS;  Service: General;  Laterality: Right;   INSERTION OF DIALYSIS CATHETER Right 12/07/2019   Procedure: Minor Dialysis catheter in place- need to reposition/ adjust catheter to help with flows;  Surgeon: [name], MD;  Location: [facility] ORS;  Service: General;  Laterality: Right;  procedure room case with 1% lidocaine, full sterile drape,  towels, full gown, large chloraprep, 4-0 Monocryl and dermabond    INSERTION OF DIALYSIS CATHETER Right 12/08/2019   Procedure: INSERTION OF DIALYSIS CATHETER EXCHANGE;  Surgeon: [name], MD;  Location: [facility] ORS;  Service: General;  Laterality: Right;   IR ANGIOGRAM EXTREMITY RIGHT  03/15/2021   IR RADIOLOGIST EVAL & MGMT  02/07/2021   IR RADIOLOGIST EVAL & MGMT  05/09/2021   IR US GUIDE VASC ACCESS RIGHT  03/15/2021   MEMBRANE PEEL Right 02/24/2019   Procedure: [place] Flax;  Surgeon: [name], MD;  Location: [facility] OR;  Service: Ophthalmology;  Laterality: Right;   PARS PLANA VITRECTOMY Left 03/31/2019   Procedure: PARS PLANA VITRECTOMY WITH 25 GAUGE WITH MEMBRANE PEEL;  Surgeon: [name], MD;  Location: [facility] OR;  Service: Ophthalmology;  Laterality: Left;   PHOTOCOAGULATION WITH LASER Right 02/24/2019   Procedure: Photocoagulation With Laser;  Surgeon: [name], MD;  Location: [facility] OR;  Service: Ophthalmology;  Laterality: Right;   PHOTOCOAGULATION WITH LASER  Left 03/31/2019   Procedure: Photocoagulation With Laser;  Surgeon: [name], MD;  Location: [facility] OR;  Service: Ophthalmology;  Laterality: Left;   RETINAL DETACHMENT SURGERY Left 03/31/2019   TRD Repair - Dr. [name]   SILICON OIL REMOVAL Right 03/31/2019   Procedure: Silicon Oil Removal;  Surgeon: [name], MD;  Location: [facility] OR;  Service: Ophthalmology;  Laterality: Right;   STERNAL WOUND DEBRIDEMENT Left 08/27/2018   Procedure: Incision and DEBRIDEMENT Left Chest, Neck and Mediastinum;  Surgeon: [name], MD;  Location: [facility] OR;  Service: Thoracic;  Laterality: Left;   STERNAL WOUND DEBRIDEMENT Left 08/30/2018   Procedure: WOUND VAC CHANGE, LEFT CHEST AND NECK, POSSIBLE DEBRIDEMENT;  Surgeon: [name], MD;  Location: [facility] OR;  Service: Thoracic;  Laterality: Left;   Social History:  reports that he has never smoked. He has never used smokeless tobacco. He reports that he does not  drink alcohol and does not use drugs. Family History:  Family History  Problem Relation Age of Onset   Hypertension Mother    Colon cancer Neg Hx      HOME MEDICATIONS: Allergies as of 01/13/2023       Reactions   Tramadol Nausea And Vomiting        Medication List        Accurate as of January 13, 2023 12:16 PM. If you have any questions, ask your nurse or doctor.          acetaminophen 500 MG tablet Commonly known as: TYLENOL Take 1,000 mg by mouth every 6 (six) hours as needed for moderate pain or headache.   amLODipine 2.5 MG tablet Commonly known as: NORVASC Take 1 tablet (2.5 mg total) by mouth daily.   atorvastatin 10 MG tablet Commonly known as: LIPITOR Take 1 tablet by mouth daily.   blood glucose meter kit and supplies Use to check sugars 3 times daily   Cholecalciferol 1.25 MG (50000 UT) capsule Take by mouth.   cinacalcet 30 MG tablet Commonly known as: SENSIPAR Take 30 mg by mouth daily with supper.   Dexcom G6 Transmitter Misc 1 Device by Does not  apply route as directed.   Dexcom G7 Sensor Misc 1 Device by Does not apply route as directed.   Insulin Pen Needle 32G X 4 MM Misc 1 Device by Does not apply route in the morning, at noon, in the evening, and at bedtime.   lidocaine-prilocaine cream Commonly known as: EMLA Apply 1 application topically Every Tuesday,Thursday,and Saturday with dialysis.   Microlet Lancets Misc USE FOUR TIMES A DAY AS DIRECTED.   NovoLOG FlexPen 100 UNIT/ML FlexPen Generic drug: insulin aspart 7 units with breakfast and lunch, and 8 units with supper CF NovoLog(BG -130/40)  Max daily 45 units   nystatin powder Commonly known as: MYCOSTATIN/NYSTOP Apply topically 2 (two) times daily. To groin and scrotum   OneTouch Ultra test strip Generic drug: glucose blood 1 each by Other route in the morning, at noon, in the evening, and at bedtime. Use as instructed   prednisoLONE acetate 1 % ophthalmic suspension Commonly known as: PRED FORTE PLACE (1) DROP INTO BOTH EYES FOUR TIMES DAILY   Prolensa 0.07 % Soln Generic drug: Bromfenac Sodium Place 1 drop into both eyes 4 (four) times daily.   sildenafil 50 MG tablet Commonly known as: VIAGRA Take by mouth.   Evaristo Bury FlexTouch 100 UNIT/ML FlexTouch Pen Generic drug: insulin degludec Inject 22 Units into the skin at bedtime.   Velphoro 500 MG chewable tablet Generic drug: sucroferric oxyhydroxide Chew 1,000 mg by mouth 3 (three) times daily with meals.         OBJECTIVE:   Vital Signs: BP 130/74 (BP Location: Left Arm, Patient Position: Sitting, Cuff Size: Large)   Pulse 76   Ht 5\' 11"  (1.803 m)   Wt 199 lb (90.3 kg)   SpO2 94%   BMI 27.75 kg/m   Wt Readings from Last 3 Encounters:  01/13/23 199 lb (90.3 kg)  10/29/22 191 lb 12.8 oz (87 kg)  10/02/22 196 lb (88.9 kg)     Exam: General: Pt appears well and is in NAD  Lungs: Clear with good BS bilat   Heart: RRR   Abdomen: soft, nontender  Extremities: No pretibial edema.    Neuro: MS is good with appropriate affect, pt is alert and Ox3       DATA REVIEWED:  Lab  Results  Component Value Date   HGBA1C >15.0 08/27/2022   HGBA1C 8.5 (A) 10/10/2020   HGBA1C 11.0 (A) 07/11/2020   Lab Results  Component Value Date   MICROALBUR 3,744.2 (H) 11/30/2019   LDLCALC 110 (H) 10/22/2020   CREATININE 9.27 (H) 03/15/2021   Lab Results  Component Value Date   MICRALBCREAT 4,825 (H) 11/30/2019     Lab Results  Component Value Date   CHOL 186 10/22/2020   HDL 57 10/22/2020   LDLCALC 110 (H) 10/22/2020   TRIG 103 10/22/2020   CHOLHDL 3.3 10/22/2020       A1c 02/13/2021 A1c 7.9%  ASSESSMENT / PLAN / RECOMMENDATIONS:   1) Type 2 Diabetes Mellitus, Poorly Controlled , With Retinopathic, neuropathic complications and ESRD  - Most recent A1c of 8.0% %. Goal A1c < 7.0 %.    -His A1c has trended down from >15.0% to 8.0% -In reviewing CGM, patient has been noted with postprandial hyperglycemia, he has been taking between 7-8 units with meals, I did advise the patient to make his prandial dose 8 units.  I did advise the patient to also take his prandial insulin before eating rather than postprandial -I encouraged the patient to use the correction scale before meals and bedtime if needed, as he has been noted with hypoglycemia after supper.  His BG last night once from 180 mg/DL to 865 mg/DL around 9 PM, this is more consistent with snacking or drinking sugar sweetened beverage, patient denies snacking around that time and states that he drank sugar-free ginger ale  -Patient is awaiting pancreas/kidney transplant   MEDICATIONS: -Continue Tresiba 22  units at Bedtime -Increase NovoLog to 8 units 3 times daily before every meal -Take  CF: NovoLog(BG -130/40) before meals and bedtime    EDUCATION / INSTRUCTIONS: BG monitoring instructions: Patient is instructed to check his blood sugars 3 times a day, before meals. Call [facility] if: BG  persistently < 70 I reviewed the Rule of 15 for the treatment of hypoglycemia in detail with the patient. Literature supplied.    2) Diabetic complications:  Eye: Does  have known diabetic retinopathy.  Neuro/ Feet: Does  have known diabetic peripheral neuropathy .  Renal: Patient does ave known baseline CKD. He   is not on an ACEI/ARB at present.     F/U in 6 months  I spent 25 minutes preparing to see the patient by review of recent labs, imaging and procedures, obtaining and reviewing separately obtained history, communicating with the patient/family or caregiver, ordering medications, tests or procedures, and documenting clinical information in the EHR including the differential Dx, treatment, and any further evaluation and other management   Signed electronically by: Lyndle Herrlich, MD  [facility]  [facility] [address] [place], [place] [address] Phone: [phone] FAX: [phone]   CC: [name], MD [address] [place] [address] Phone: [phone]  Fax: [phone]  Return to Endocrinology clinic as below: Future Appointments  Date Time Provider Department Center  01/13/2023  3:00 PM [name], MD [facility] None  01/15/2023 10:00 AM Gwenith Daily, RN [facility] None  06/05/2023  8:30 AM [facility]-ANNUAL WELLNESS VISIT [facility]

## 2023-01-14 ENCOUNTER — Encounter: Payer: Self-pay | Admitting: Internal Medicine

## 2023-01-15 ENCOUNTER — Ambulatory Visit: Payer: Self-pay | Admitting: *Deleted

## 2023-01-15 ENCOUNTER — Encounter: Payer: Self-pay | Admitting: *Deleted

## 2023-01-15 NOTE — Patient Outreach (Signed)
  Care Coordination   Follow Up Visit Note   01/15/2023 Name: Randy Oneal MRN: [id number] DOB: 1965-12-07  Randy Oneal is a 57 y.o. year old male who sees Luking, Jonna Coup, MD for primary care. I spoke with  Randy Oneal by phone today.  What matters to the patients health and wellness today?  Managing blood sugar and diabetes complications. Patient met goal of lowering A1C to less than 10%.   Goals Addressed      Over the next 6 months, patient will have an A1C of less than 8%   On track    Care Coordination Goals: Patient will follow-up endocrinologist every 6 months as recommended A1C was 8.0% on 01/13/2023 Patient will take medication as prescribed and reach out to provider with any negative side effects Patient will continue to monitor and record blood sugar 3 times per day and as needed with Dexcom 7 CGM, and will call endocrinologist with any readings outside of recommended range Patient will take Dexcom to provider visits for blood sugar review Patient will follow a modified carbohydrate diet, watch portion sizes, and decrease simple carbohydrates and sugars Patient will remain physically active with a goal of at least 150 minutes of moderate activity weekly Patient will note how blood sugar is decreased by exercise, for positive reinforcement  Patient will check feet daily for sores, wounds, calluses, etc and will notify provider of any abnormal findings Patient will follow-up with ophthalmologist as recommended to monitor diabetic retinopathy  Patient will reach out to RN Care Coordinator [phone] with any care coordination or resource needs        SDOH assessments and interventions completed:  Yes  SDOH Interventions Today    Flowsheet Row Most Recent Value  SDOH Interventions   Transportation Interventions Intervention Not Indicated  Financial Strain Interventions Intervention Not Indicated  Physical Activity Interventions Intervention Not Indicated         Care Coordination Interventions:  Yes, provided   Follow up plan: Follow up call scheduled for 03/19/23    Encounter Outcome:  Pt. Visit Completed   Demetrios Loll, BSN, RN-BC RN Care Coordinator [facility]  [facility] Direct Dial: [phone] Main #: [phone]

## 2023-02-17 NOTE — Progress Notes (Signed)
[facility] - Clinic Note  02/24/2023    CHIEF COMPLAINT Patient presents for Retina Follow Up  HISTORY OF PRESENT ILLNESS: Randy Oneal is a 57 y.o. male who presents to the clinic today for:  HPI     Retina Follow Up   Patient presents with  Diabetic Retinopathy.  In both eyes.  This started 6 weeks ago.  I, the attending physician,  performed the HPI with the patient and updated documentation appropriately.        Comments   Patient here for 6 weeks retina follow up for PDR OU. Patient states vision about the same. No eye pain.       Last edited by Randy Chris, MD on 02/24/2023  5:15 PM.    Pt  Referring physician: [name], MD [address] [place],  [place] [address]  HISTORICAL INFORMATION:   Selected notes from the MEDICAL RECORD NUMBER Referred by Dr.Mark Nile Oneal for concern of decreased vision post cataract sx   CURRENT MEDICATIONS: Current Outpatient Medications (Ophthalmic Drugs)  Medication Sig   Bromfenac Sodium (PROLENSA) 0.07 % SOLN Place 1 drop into both eyes 4 (four) times daily.   prednisoLONE acetate (PRED FORTE) 1 % ophthalmic suspension PLACE (1) DROP INTO BOTH EYES FOUR TIMES DAILY   No current facility-administered medications for this visit. (Ophthalmic Drugs)   Current Outpatient Medications (Other)  Medication Sig   acetaminophen (TYLENOL) 500 MG tablet Take 1,000 mg by mouth every 6 (six) hours as needed for moderate pain or headache.   amLODipine (NORVASC) 2.5 MG tablet Take 1 tablet (2.5 mg total) by mouth daily.   atorvastatin (LIPITOR) 10 MG tablet Take 1 tablet by mouth daily.   blood glucose meter kit and supplies Use to check sugars 3 times daily   Cholecalciferol 1.25 MG (50000 UT) capsule Take by mouth.   cinacalcet (SENSIPAR) 30 MG tablet Take 30 mg by mouth daily with supper.   Continuous Blood Gluc Sensor (DEXCOM G7 SENSOR) MISC 1 Device by Does not apply route as directed.   glucose  blood (ONETOUCH ULTRA) test strip 1 each by Other route in the morning, at noon, in the evening, and at bedtime. Use as instructed   insulin aspart (NOVOLOG FLEXPEN) 100 UNIT/ML FlexPen 7 units with breakfast and lunch, and 8 units with supper CF NovoLog(BG -130/40)  Max daily 45 units   insulin degludec (TRESIBA FLEXTOUCH) 100 UNIT/ML FlexTouch Pen Inject 22 Units into the skin at bedtime.   Insulin Pen Needle 32G X 4 MM MISC 1 Device by Does not apply route in the morning, at noon, in the evening, and at bedtime.   lidocaine-prilocaine (EMLA) cream Apply 1 application topically Every Tuesday,Thursday,and Saturday with dialysis.   MICROLET LANCETS MISC USE FOUR TIMES A DAY AS DIRECTED.   nystatin (MYCOSTATIN/NYSTOP) powder Apply topically 2 (two) times daily. To groin and scrotum   sildenafil (VIAGRA) 50 MG tablet Take by mouth.   sucroferric oxyhydroxide (VELPHORO) 500 MG chewable tablet Chew 1,000 mg by mouth 3 (three) times daily with meals.   No current facility-administered medications for this visit. (Other)   REVIEW OF SYSTEMS: ROS   Positive for: Genitourinary, Endocrine, Eyes Negative for: Constitutional, Gastrointestinal, Neurological, Skin, Musculoskeletal, HENT, Cardiovascular, Respiratory, Psychiatric, Allergic/Imm, Heme/Lymph Last edited by [name], COA on 02/24/2023  2:02 PM.      ALLERGIES Allergies  Allergen Reactions   Tramadol Nausea And Vomiting   PAST MEDICAL HISTORY Past  Medical History:  Diagnosis Date   Anemia    Cataract 07/02/2020   Diabetes mellitus    Type 2    Diabetic retinopathy of both eyes (HCC) 01/31/2019   Dr Randy Oneal 01/2019   ESRD (end stage renal disease) (HCC)    Randy Oneal   Herpes simplex virus (HSV) infection    OU   Hypertension    Hypertensive retinopathy    OU   Retinal detachment    OS   Sepsis due to Streptococcus, group B (HCC) 10/05/2018   Past Surgical History:  Procedure Laterality Date   25 GAUGE PARS  PLANA VITRECTOMY WITH 20 GAUGE MVR PORT FOR MACULAR HOLE Right 02/24/2019   Procedure: 25 GAUGE PARS PLANA VITRECTOMY WITH 20 GAUGE MVR PORT FOR MACULAR HOLE;  Surgeon: Randy Chris, MD;  Location: [facility] OR;  Service: Ophthalmology;  Laterality: Right;   APPLICATION OF WOUND VAC Left 08/27/2018   Procedure: APPLICATION OF WOUND VAC, left neck;  Surgeon: Randy Borne, MD;  Location: [facility] OR;  Service: Thoracic;  Laterality: Left;   APPLICATION OF WOUND VAC Left 08/30/2018   Procedure: APPLICATION OF WOUND VAC;  Surgeon: Randy Borne, MD;  Location: [facility] OR;  Service: Thoracic;  Laterality: Left;   AV FISTULA PLACEMENT Left 02/15/2020   Procedure: LEFT ARM ARTERIOVENOUS (AV) FISTULA CREATION;  Surgeon: Randy Harman, MD;  Location: [facility] OR;  Service: Vascular;  Laterality: Left;   CATARACT EXTRACTION Bilateral    CATARACT EXTRACTION W/ INTRAOCULAR LENS  IMPLANT, BILATERAL     COLONOSCOPY  06/24/2011   Procedure: COLONOSCOPY;  Surgeon: Randy Harman, MD;  Location: [facility] SUITE;  Service: Endoscopy;  Laterality: N/A;  8:30 AM   EYE SURGERY     GAS/FLUID EXCHANGE Left 03/31/2019   Procedure: Gas/Fluid Exchange;  Surgeon: Randy Chris, MD;  Location: [facility] OR;  Service: Ophthalmology;  Laterality: Left;   INJECTION OF SILICONE OIL  02/24/2019   Procedure: Injection Of Silicone Oil;  Surgeon: Randy Chris, MD;  Location: [facility] OR;  Service: Ophthalmology;;   INSERTION OF DIALYSIS CATHETER Right 12/05/2019   Procedure: INSERTION OF DIALYSIS CATHETER RIGHT SUBCLAVIAN;  Surgeon: Randy Roers, MD;  Location: [facility] ORS;  Service: General;  Laterality: Right;   INSERTION OF DIALYSIS CATHETER Right 12/07/2019   Procedure: Minor Dialysis catheter in place- need to reposition/ adjust catheter to help with flows;  Surgeon: Randy Roers, MD;  Location: [facility] ORS;  Service: General;  Laterality: Right;  procedure room case with 1% lidocaine, full sterile drape,  towels, full gown, large chloraprep, 4-0  Monocryl and dermabond    INSERTION OF DIALYSIS CATHETER Right 12/08/2019   Procedure: INSERTION OF DIALYSIS CATHETER EXCHANGE;  Surgeon: Randy Roers, MD;  Location: [facility] ORS;  Service: General;  Laterality: Right;   IR ANGIOGRAM EXTREMITY RIGHT  03/15/2021   IR RADIOLOGIST EVAL & MGMT  02/07/2021   IR RADIOLOGIST EVAL & MGMT  05/09/2021   IR US GUIDE VASC ACCESS RIGHT  03/15/2021   MEMBRANE PEEL Right 02/24/2019   Procedure: Eula Flax;  Surgeon: Randy Chris, MD;  Location: [facility] OR;  Service: Ophthalmology;  Laterality: Right;   PARS PLANA VITRECTOMY Left 03/31/2019   Procedure: PARS PLANA VITRECTOMY WITH 25 GAUGE WITH MEMBRANE PEEL;  Surgeon: Randy Chris, MD;  Location: [facility] OR;  Service: Ophthalmology;  Laterality: Left;   PHOTOCOAGULATION WITH LASER Right 02/24/2019   Procedure: Photocoagulation With Laser;  Surgeon: Randy Chris, MD;  Location: [facility] OR;  Service: Ophthalmology;  Laterality: Right;   PHOTOCOAGULATION WITH LASER Left 03/31/2019   Procedure: Photocoagulation With Laser;  Surgeon: Randy Chris, MD;  Location: [facility] OR;  Service: Ophthalmology;  Laterality: Left;   RETINAL DETACHMENT SURGERY Left 03/31/2019   TRD Repair - Dr. Rennis Oneal   SILICON OIL REMOVAL Right 03/31/2019   Procedure: Silicon Oil Removal;  Surgeon: Randy Chris, MD;  Location: [facility] OR;  Service: Ophthalmology;  Laterality: Right;   STERNAL WOUND DEBRIDEMENT Left 08/27/2018   Procedure: Incision and DEBRIDEMENT Left Chest, Neck and Mediastinum;  Surgeon: Randy Borne, MD;  Location: [facility] OR;  Service: Thoracic;  Laterality: Left;   STERNAL WOUND DEBRIDEMENT Left 08/30/2018   Procedure: WOUND VAC CHANGE, LEFT CHEST AND NECK, POSSIBLE DEBRIDEMENT;  Surgeon: Randy Borne, MD;  Location: [facility] OR;  Service: Thoracic;  Laterality: Left;   FAMILY HISTORY Family History  Problem Relation Age of Onset   Hypertension Mother    Colon cancer Neg Hx    SOCIAL HISTORY Social History   Tobacco Use   Smoking status:  Never   Smokeless tobacco: Never  Vaping Use   Vaping status: Never Used  Substance Use Topics   Alcohol use: No   Drug use: No       OPHTHALMIC EXAM:  Base Eye Exam     Visual Acuity (Snellen - Linear)       Right Left   Dist [place] 20/200 20/200 -1   Dist ph [place] 20/150 -1 20/150         Tonometry (Tonopen, 2:00 PM)       Right Left   Pressure 18 18         Pupils       Dark Light Shape React APD   Right 4 3 Irregular Brisk None   Left 4 3 Irregular Brisk None         Visual Fields       Left Right   Restrictions Partial outer superior temporal, inferior temporal, inferior nasal deficiencies Partial outer superior temporal, inferior temporal deficiencies         Extraocular Movement       Right Left    Full, Ortho Full, Ortho         Neuro/Psych     Oriented x3: Yes   Mood/Affect: Normal         Dilation     Both eyes: 1.0% Mydriacyl, 2.5% Phenylephrine @ 2:00 PM           Slit Lamp and Fundus Exam     Slit Lamp Exam       Right Left   Lids/Lashes mild Meibomian gland dysfunction mild Meibomian gland dysfunction   Conjunctiva/Sclera subconj silicon oil bubbles, Chemosis, conj Cyst White and quiet   Cornea Trace PEE, Well healed cataract wound, arcus, Debris in tear film Trace PEE, Well healed cataract wound, arcus, Debris in tear film   Anterior Chamber Deep and quiet, no cell or flare Deep and quiet, no cell or flare   Iris slightly irregular dilation, posterior synchiae from 3:00-1200 round, focal Posterior synechiae at 1030   Lens PC IOL in good position, pigment deposition, 1+ PCO, mild pigment deposition on optic PC IOL in good position, mild pigment deposition   Anterior Vitreous Post vit, silicon oil bubble ~85% post vitrectomy, good silicone oil fill         Fundus Exam       Right Left   Disc 2+pallor, sharp rim, mild fibrosis along  SN rim +pallor, sharp rim, fine NVD--regressed, mild fibrosis   C/D Ratio 0.2 0.2    Macula attached under oil, scattered MA/IRH -- improved, nasal and central thickening / edema -- persistent attached under oil, persistent fibrosis, persistent edema, focal bands of PRF with traction emanating from ST macula -- stable, focal DBH temporal macula -- improved   Vessels attenuated, Tortuous Attenuated, dilated venules, Tortuous   Periphery Attached, dense 360 PRP laser, mild scattered fibrosis - persistent, Focal ret hole along distal IT arcades--good laser surrounding Attached, good 360 PRP laser changes, pre-retinal fibrosis extending to posterior PRP border superior and inferiorly           IMAGING AND PROCEDURES  Imaging and Procedures for @TODAY @  OCT, Retina - OU - Both Eyes       Right Eye Quality was good. Central Foveal Thickness: 641. Progression has been stable. Findings include no SRF, abnormal foveal contour, epiretinal membrane, intraretinal fluid, outer retinal atrophy, preretinal fibrosis (Persistent IRF/edema central and nasal macula ).   Left Eye Quality was good. Central Foveal Thickness: 579. Progression has been stable. Findings include no SRF, abnormal foveal contour, intraretinal hyper-reflective material, epiretinal membrane, intraretinal fluid, preretinal fibrosis (Persistent central IRF/IRHM -- no significant change from prior).   Notes *Images captured and stored on drive  Diagnosis / Impression:  +DME under silicon oil OU OD: persistent IRF/edema central and nasal macula  OS: Persistent central IRF/IRHM -- no significant change from prior  Clinical management:  See below  Abbreviations: NFP - Normal foveal profile. CME - cystoid macular edema. PED - pigment epithelial detachment. IRF - intraretinal fluid. SRF - subretinal fluid. EZ - ellipsoid zone. ERM - epiretinal membrane. ORA - outer retinal atrophy. ORT - outer retinal tubulation. SRHM - subretinal hyper-reflective material      Intravitreal Injection, Pharmacologic Agent - OD - Right  Eye       Time Out 02/24/2023. 2:21 PM. Confirmed correct patient, procedure, site, and patient consented.   Anesthesia Topical anesthesia was used. Anesthetic medications included Lidocaine 2%, Proparacaine 0.5%.   Procedure Preparation included 5% betadine to ocular surface, eyelid speculum. A (32g) needle was used.   Injection: 2 mg aflibercept 2 MG/0.05ML   Route: Intravitreal, Site: Right Eye   NDC: L6038910, Lot: [id number], Expiration date: 04/10/2024, Waste: 0 mL   Post-op Post injection exam found visual acuity of at least counting fingers. The patient tolerated the procedure well. There were no complications. The patient received written and verbal post procedure care education. Post injection medications were not given.      Intravitreal Injection, Pharmacologic Agent - OS - Left Eye       Time Out 02/24/2023. 2:22 PM. Confirmed correct patient, procedure, site, and patient consented.   Anesthesia Topical anesthesia was used. Anesthetic medications included Lidocaine 2%, Proparacaine 0.5%.   Procedure Preparation included 5% betadine to ocular surface, eyelid speculum. A (32g) needle was used.   Injection: 6 mg faricimab-svoa 6 MG/0.05ML   Route: Intravitreal, Site: Left Eye   NDC: O8010301, Lot: [id number], Expiration date: 02/07/2025, Waste: 0 mL   Post-op Post injection exam found visual acuity of at least counting fingers. The patient tolerated the procedure well. There were no complications. The patient received written and verbal post procedure care education. Post injection medications were not given.            ASSESSMENT/PLAN:    ICD-10-CM   1. Proliferative diabetic retinopathy of both eyes with macular edema associated with type  2 diabetes mellitus (HCC)  E11.3513 OCT, Retina - OU - Both Eyes    Intravitreal Injection, Pharmacologic Agent - OD - Right Eye    Intravitreal Injection, Pharmacologic Agent - OS - Left Eye    faricimab-svoa  (VABYSMO) 6mg /0.72mL intravitreal injection    aflibercept (EYLEA) SOLN 2 mg    2. Long term (current) use of oral hypoglycemic drugs  Z79.84     3. Long-term (current) use of injectable non-insulin antidiabetic drugs  Z79.85     4. 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 Vitreous hemorrhage of both eyes (HCC)  H43.13     6. Essential hypertension  I10     7. Hypertensive retinopathy of both eyes  H35.033     8. Pseudophakia of both eyes  Z96.1      1-5. Proliferative diabetic retinopathy with DME, TRD, and vitreous hemorrhage OU (OD > OS)  - last A1c: 8.0 on 06.04.24, 9.6 on 11.11.22  - lost to f/u from Jan 2021 to Oct 2021 due to loss of insurance coverage  - pt with complex medical history with hospitalization in Jan-Feb 2020 for bacteremia and abscess  - history of poor glycemic control for years  - s/p IVA OD #1 (06.20.20), #2 (07.01.20), #3 (09.11.20), #4 (10.09.20), #5 (11.06.20), #6 (11.24.21), #7 (12.22.21), #8 (01.19.22)  - s/p IVA OS #1 (06.20.20), #2 (07.01.20), #3 (08.13.20), #4 (09.11.20)  #5 (11.06.20), #6 (11.24.21), #7 (12.22.21), #8 (01.19.22) - s/p IVE OU #1 (12.04.20) - sample; #2 (01.06.21), #3 (02.18.22), #4 (03.18.22), #5 (04.18.22), #6 (05.25.22), #7 (07.01.22), #8 (07.29.22), #9 (09.02.22), #10 (09.30.22), #11 (11.04.22), #12 (12.09.23), #13 (01.13.23), #14 (03.02.23), #15 (04.11.23), #16 (06.01.23), #17 (07.11.23), #18 (09.12.23), #19 (11.14.23) - s/p IVE OD #20 (03.12.24), #21 (04.23.24), #22 (06.04.24) - s/p IVV OS #1 (03.12.24), #2 (04.23.24), #3 (06.04.24)  - s/p STK OS #1 (10.09.20)  - s/p PRP OS (06.10.20)  - s/p PPV/PFC/EL/FAX/silicone oil OD, 07.16.20  - s/p subconj silicon oil removal OD 8.20.20  - s/p PPV/EL/FAX/silicone oil OS, 08.20.20  - BCVA OD: 20/150 OU -- stable             - OCT shows OD: persistent IRF/edema central and nasal macula; OS: Persistent central IRF/IRHM -- no significant change from prior at 6 wks  -  recommend IVE OD #23 and IVV OS #4 today, 07.16.24 with f/u in 6 weeks  - RBA of procedure discussed, questions answered  - IVV informed consent obtained and signed, 03.12.23 (OU)  - see procedure note             - Eyle[employer] benefits investigation begun 01.19.22 -- approved for 2024  - cont topical PF and Prolensa QID OU for possible CME component  - paper script for Prolensa given for [employer] patient assistance program  - f/u 6 weeks -- DFE/OCT, possible injection(s)  6,7. Hypertensive retinopathy OU  - discussed importance of tight BP control  - continue to monitor  8. Pseudophakia OU  - s/p CE with IOL (Dr. Nile Oneal)  Ophthalmic Meds Ordered this visit:  Meds ordered this encounter  Medications   faricimab-svoa (VABYSMO) 6mg /0.23mL intravitreal injection   aflibercept (EYLEA) SOLN 2 mg     Return in about 6 weeks (around 04/07/2023) for f/u PDR OU, DFE, OCT.  There are no Patient Instructions on file for this visit.  This document serves as a record of services personally performed by Karie Chimera, MD, PhD. It was created on their  behalf by Gerilyn Nestle, COT an ophthalmic technician. The creation of this record is the provider's dictation and/or activities during the visit.    Electronically signed by:  Charlette Caffey, COT  02/24/23 5:17 PM  This document serves as a record of services personally performed by Karie Chimera, MD, PhD. It was created on their behalf by Glee Arvin. Manson Passey, OA an ophthalmic technician. The creation of this record is the provider's dictation and/or activities during the visit.    Electronically signed by: Glee Arvin. Manson Passey, OA 02/24/23 5:17 PM  Karie Chimera, M.D., Ph.D. Diseases & Surgery of the Retina and Vitreous [facility]  I have reviewed the above documentation for accuracy and completeness, and I agree with the above. Karie Chimera, M.D., Ph.D. 02/24/23 5:18 PM   Abbreviations: M myopia (nearsighted); A  astigmatism; H hyperopia (farsighted); P presbyopia; Mrx spectacle prescription;  CTL contact lenses; OD right eye; OS left eye; OU both eyes  XT exotropia; ET esotropia; PEK punctate epithelial keratitis; PEE punctate epithelial erosions; DES dry eye syndrome; MGD meibomian gland dysfunction; ATs artificial tears; PFAT's preservative free artificial tears; NSC nuclear sclerotic cataract; PSC posterior subcapsular cataract; ERM epi-retinal membrane; PVD posterior vitreous detachment; RD retinal detachment; DM diabetes mellitus; DR diabetic retinopathy; NPDR non-proliferative diabetic retinopathy; PDR proliferative diabetic retinopathy; CSME clinically significant macular edema; DME diabetic macular edema; dbh dot blot hemorrhages; CWS cotton wool spot; POAG primary open angle glaucoma; C/D cup-to-disc ratio; HVF humphrey visual field; GVF goldmann visual field; OCT optical coherence tomography; IOP intraocular pressure; BRVO Branch retinal vein occlusion; CRVO central retinal vein occlusion; CRAO central retinal artery occlusion; BRAO branch retinal artery occlusion; RT retinal tear; SB scleral buckle; PPV pars plana vitrectomy; VH Vitreous hemorrhage; PRP panretinal laser photocoagulation; IVK intravitreal kenalog; VMT vitreomacular traction; MH Macular hole;  NVD neovascularization of the disc; NVE neovascularization elsewhere; AREDS age related eye disease study; ARMD age related macular degeneration; POAG primary open angle glaucoma; EBMD epithelial/anterior basement membrane dystrophy; ACIOL anterior chamber intraocular lens; IOL intraocular lens; PCIOL posterior chamber intraocular lens; Phaco/IOL phacoemulsification with intraocular lens placement; PRK photorefractive keratectomy; LASIK laser assisted in situ keratomileusis; HTN hypertension; DM diabetes mellitus; COPD chronic obstructive pulmonary disease

## 2023-02-20 ENCOUNTER — Telehealth: Payer: Self-pay | Admitting: Family Medicine

## 2023-02-20 NOTE — Telephone Encounter (Signed)
Dr. Assunta Curtis from [facility] left message on voice mail yesterday about patient kidney transplant she is wanting to speak with you. Her cell number is [phone]

## 2023-02-24 ENCOUNTER — Ambulatory Visit (INDEPENDENT_AMBULATORY_CARE_PROVIDER_SITE_OTHER): Payer: Medicare Other | Admitting: Ophthalmology

## 2023-02-24 ENCOUNTER — Encounter (INDEPENDENT_AMBULATORY_CARE_PROVIDER_SITE_OTHER): Payer: Self-pay | Admitting: Ophthalmology

## 2023-02-24 DIAGNOSIS — H35033 Hypertensive retinopathy, bilateral: Secondary | ICD-10-CM

## 2023-02-24 DIAGNOSIS — Z7985 Long-term (current) use of injectable non-insulin antidiabetic drugs: Secondary | ICD-10-CM | POA: Diagnosis not present

## 2023-02-24 DIAGNOSIS — H3343 Traction detachment of retina, bilateral: Secondary | ICD-10-CM

## 2023-02-24 DIAGNOSIS — Z7984 Long term (current) use of oral hypoglycemic drugs: Secondary | ICD-10-CM | POA: Diagnosis not present

## 2023-02-24 DIAGNOSIS — E113513 Type 2 diabetes mellitus with proliferative diabetic retinopathy with macular edema, bilateral: Secondary | ICD-10-CM | POA: Diagnosis not present

## 2023-02-24 DIAGNOSIS — Z961 Presence of intraocular lens: Secondary | ICD-10-CM

## 2023-02-24 DIAGNOSIS — I10 Essential (primary) hypertension: Secondary | ICD-10-CM

## 2023-02-24 DIAGNOSIS — H4313 Vitreous hemorrhage, bilateral: Secondary | ICD-10-CM

## 2023-02-24 MED ORDER — FARICIMAB-SVOA 6 MG/0.05ML IZ SOLN
6.0000 mg | INTRAVITREAL | Status: AC | PRN
Start: 2023-02-24 — End: 2023-02-24
  Administered 2023-02-24: 6 mg via INTRAVITREAL

## 2023-02-24 MED ORDER — AFLIBERCEPT 2MG/0.05ML IZ SOLN FOR KALEIDOSCOPE
2.0000 mg | INTRAVITREAL | Status: AC | PRN
Start: 2023-02-24 — End: 2023-02-24
  Administered 2023-02-24: 2 mg via INTRAVITREAL

## 2023-02-25 NOTE — Telephone Encounter (Signed)
I did talk with the transplant coordinator physician They recommended that the patient needs some physical therapy to build up his strength I recommend a current office visit-please reach out to the patient get him scheduled to see me somewhere within the next 3 to 4 weeks thank you  Also please see if he is interested with physical therapy first purpose of building up strength and stamina?

## 2023-02-26 NOTE — Telephone Encounter (Signed)
Called patient and was unable to leave message mailbox is full

## 2023-02-27 NOTE — Telephone Encounter (Signed)
Called Pt but unable to leave message due to full mailbox

## 2023-03-03 NOTE — Telephone Encounter (Signed)
Called Pt but unable to leave message due to full mailbox

## 2023-03-19 ENCOUNTER — Encounter: Payer: Self-pay | Admitting: *Deleted

## 2023-03-19 ENCOUNTER — Ambulatory Visit: Payer: Self-pay | Admitting: *Deleted

## 2023-03-19 NOTE — Patient Outreach (Signed)
Care Coordination   Health Equity Plan Follow Up Visit Note   03/19/2023 Name: Randy Oneal MRN: [id number] DOB: May 28, 1966  Randy Oneal is a 57 y.o. year old male who sees Luking, Jonna Coup, MD for primary care. I spoke with  Randy Oneal by phone today.  What matters to the patients health and wellness today?  Managing blood sugar    Goals Addressed             This Visit's Progress    Over the next 6 months, patient will have an A1C of less than 8%   On track    Care Coordination Goals: Patient will follow-up endocrinologist every 6 months as recommended A1C was 8.0% on 01/13/2023 Patient will take medication as prescribed and reach out to provider with any negative side effects Patient will continue to monitor and record blood sugar 3 times per day and as needed with Dexcom 7, and will call endocrinologist with any readings outside of recommended range Patient will take Dexcom to provider visits for blood sugar review Patient will follow a modified carbohydrate diet, watch portion sizes, and decrease simple carbohydrates and sugars Patient will remain physically active with a goal of at least 150 minutes of moderate activity weekly Patient will note how blood sugar is decreased by exercise, for positive reinforcement  Patient will check feet daily for sores, wounds, calluses, etc and will notify provider of any abnormal findings Patient will follow-up with ophthalmologist as recommended to monitor diabetic retinopathy  Patient will reach out to RN Care Coordinator [phone] with any care coordination or resource needs        SDOH assessments and interventions completed:  Yes  SDOH Interventions Today    Flowsheet Row Most Recent Value  SDOH Interventions   Transportation Interventions Intervention Not Indicated  Financial Strain Interventions Intervention Not Indicated        Care Coordination Interventions:  Yes, provided  Interventions Today     Flowsheet Row Most Recent Value  Chronic Disease   Chronic disease during today's visit Diabetes  General Interventions   General Interventions Discussed/Reviewed General Interventions Discussed, General Interventions Reviewed, Annual Eye Exam, Labs, Annual Foot Exam, Durable Medical Equipment (DME), Doctor Visits  Labs Hgb A1c every 3 months, Kidney Function  Doctor Visits Discussed/Reviewed Doctor Visits Discussed, Doctor Visits Reviewed, Annual Wellness Visits, PCP, Specialist  Durable Medical Equipment (DME) Glucomoter  [Dexcom. Blood Sugar 112. Blood sugar tends to peak around 2 hours after eating. Advised that this is expected but it should be below 200 and ideally below 150.]  PCP/Specialist Visits Compliance with follow-up visit, Contact provider for referral to  [[name] (retina specialist) on 04/07/23. Cardiology, Vascular, and Infectious Disease at [facility] on 03/26/23.]  Contacted provider for referral to --  [Schedule an Appt. with PCP office to discuss physical therapy that was recommended by renal transplant team. See telephone note from Dr Luking.]  Exercise Interventions   Exercise Discussed/Reviewed Exercise Discussed, Physical Activity, Exercise Reviewed  [encouraged to maintain current physical activity level of more than 150 minutes of moderate activity per week]  Physical Activity Discussed/Reviewed Physical Activity Discussed, Physical Activity Reviewed, Types of exercise  Education Interventions   Education Provided Provided Education, Provided Printed Education  [printed education on diabetes mellitus and nutrition]  Provided Verbal Education On Nutrition, Eye Care, Blood Sugar Monitoring, Labs, When to see the doctor, Medication, Exercise  Labs Reviewed Hgb A1c  [01/13/23 A1C 8.0%]  Nutrition Interventions   Nutrition  Discussed/Reviewed Nutrition Reviewed, Carbohydrate meal planning, Portion sizes, Nutrition Discussed, Adding fruits and vegetables, Increasing proteins,  Fluid intake, Decreasing sugar intake  [3 meals per day with 30GM of CHO & up to 2 snacks with less than 15GM of CHO. Explained blood sugar is usually highest about 2 hrs p eating. Monitor bood sugar for personal trends associated with certain foods. Pair CHO with protein & healthy fat.]  Pharmacy Interventions   Pharmacy Dicussed/Reviewed Pharmacy Topics Discussed, Pharmacy Topics Reviewed, Medications and their functions  [Taking medications as prescribed. No questions or concerns.]  Safety Interventions   Safety Discussed/Reviewed Safety Discussed, Safety Reviewed       Follow up plan: Follow up call scheduled for 04/23/23    Encounter Outcome:  Pt. Visit Completed   Demetrios Loll, BSN, RN-BC RN Care Coordinator [facility]  [facility] Direct Dial: [phone] Main #: [phone]

## 2023-03-20 NOTE — Telephone Encounter (Signed)
We have been unable to reach patient by phone for provider's recomendations

## 2023-03-22 ENCOUNTER — Encounter: Payer: Self-pay | Admitting: Family Medicine

## 2023-03-22 NOTE — Telephone Encounter (Signed)
Letter being mailed to the patient to get him to come in for an office visit

## 2023-03-22 NOTE — Progress Notes (Signed)
Please mail to patient

## 2023-04-07 ENCOUNTER — Encounter (INDEPENDENT_AMBULATORY_CARE_PROVIDER_SITE_OTHER): Payer: Medicare Other | Admitting: Ophthalmology

## 2023-04-07 DIAGNOSIS — I10 Essential (primary) hypertension: Secondary | ICD-10-CM

## 2023-04-07 DIAGNOSIS — E113513 Type 2 diabetes mellitus with proliferative diabetic retinopathy with macular edema, bilateral: Secondary | ICD-10-CM

## 2023-04-07 DIAGNOSIS — H3343 Traction detachment of retina, bilateral: Secondary | ICD-10-CM

## 2023-04-07 DIAGNOSIS — H4313 Vitreous hemorrhage, bilateral: Secondary | ICD-10-CM

## 2023-04-07 DIAGNOSIS — Z7984 Long term (current) use of oral hypoglycemic drugs: Secondary | ICD-10-CM

## 2023-04-07 DIAGNOSIS — Z7985 Long-term (current) use of injectable non-insulin antidiabetic drugs: Secondary | ICD-10-CM

## 2023-04-07 DIAGNOSIS — H35033 Hypertensive retinopathy, bilateral: Secondary | ICD-10-CM

## 2023-04-07 DIAGNOSIS — Z961 Presence of intraocular lens: Secondary | ICD-10-CM

## 2023-04-07 NOTE — Progress Notes (Signed)
[facility] - Clinic Note  04/10/2023    CHIEF COMPLAINT Patient presents for Retina Follow Up  HISTORY OF PRESENT ILLNESS: Randy Oneal is a 57 y.o. male who presents to the clinic today for:  HPI     Retina Follow Up   Patient presents with  Diabetic Retinopathy.  In both eyes.  This started 6 weeks ago.  I, the attending physician,  performed the HPI with the patient and updated documentation appropriately.        Comments   Patient here for 6 weeks retina follow up for PDR OU. Patient states vision about the same. Holding its own. Using drops Prolensa QID but good to get TID OU and Prednisone QID but good to get TID OU. No eye pain.       Last edited by [name], MD on 04/10/2023  2:41 PM.     Pt feels like his vision is pretty good  Referring physician: [name], MD [address] [place],  [place] [address]  HISTORICAL INFORMATION:   Selected notes from the MEDICAL RECORD NUMBER Referred by Dr.[name] for concern of decreased vision post cataract sx   CURRENT MEDICATIONS: Current Outpatient Medications (Ophthalmic Drugs)  Medication Sig   Bromfenac Sodium (PROLENSA) 0.07 % SOLN Place 1 drop into both eyes 4 (four) times daily.   prednisoLONE acetate (PRED FORTE) 1 % ophthalmic suspension PLACE (1) DROP INTO BOTH EYES FOUR TIMES DAILY   No current facility-administered medications for this visit. (Ophthalmic Drugs)   Current Outpatient Medications (Other)  Medication Sig   acetaminophen (TYLENOL) 500 MG tablet Take 1,000 mg by mouth every 6 (six) hours as needed for moderate pain or headache.   amLODipine (NORVASC) 2.5 MG tablet Take 1 tablet (2.5 mg total) by mouth daily.   atorvastatin (LIPITOR) 10 MG tablet Take 1 tablet by mouth daily.   blood glucose meter kit and supplies Use to check sugars 3 times daily   Cholecalciferol 1.25 MG (50000 UT) capsule Take by mouth.   cinacalcet (SENSIPAR) 30 MG tablet Take 30  mg by mouth daily with supper.   Continuous Blood Gluc Sensor (DEXCOM G7 SENSOR) MISC 1 Device by Does not apply route as directed.   glucose blood (ONETOUCH ULTRA) test strip 1 each by Other route in the morning, at noon, in the evening, and at bedtime. Use as instructed   insulin aspart (NOVOLOG FLEXPEN) 100 UNIT/ML FlexPen 7 units with breakfast and lunch, and 8 units with supper CF NovoLog(BG -130/40)  Max daily 45 units   insulin degludec (TRESIBA FLEXTOUCH) 100 UNIT/ML FlexTouch Pen Inject 22 Units into the skin at bedtime.   Insulin Pen Needle 32G X 4 MM MISC 1 Device by Does not apply route in the morning, at noon, in the evening, and at bedtime.   lidocaine-prilocaine (EMLA) cream Apply 1 application topically Every Tuesday,Thursday,and Saturday with dialysis.   MICROLET LANCETS MISC USE FOUR TIMES A DAY AS DIRECTED.   nystatin (MYCOSTATIN/NYSTOP) powder Apply topically 2 (two) times daily. To groin and scrotum   sildenafil (VIAGRA) 50 MG tablet Take by mouth.   sucroferric oxyhydroxide (VELPHORO) 500 MG chewable tablet Chew 1,000 mg by mouth 3 (three) times daily with meals.   No current facility-administered medications for this visit. (Other)   REVIEW OF SYSTEMS: ROS   Positive for: Genitourinary, Endocrine, Eyes Negative for: Constitutional, Gastrointestinal, Neurological, Skin, Musculoskeletal, HENT, Cardiovascular, Respiratory, Psychiatric, Allergic/Imm, Heme/Lymph Last edited by Kemper Durie,  Octaviano Batty, COA on 04/10/2023  2:13 PM.     ALLERGIES Allergies  Allergen Reactions   Tramadol Nausea And Vomiting   PAST MEDICAL HISTORY Past Medical History:  Diagnosis Date   Anemia    Cataract 07/02/2020   Diabetes mellitus    Type 2    Diabetic retinopathy of both eyes (HCC) 01/31/2019   [name] 01/2019   ESRD (end stage renal disease) (HCC)    [place] Frenisus   Herpes simplex virus (HSV) infection    OU   Hypertension    Hypertensive retinopathy    OU   Retinal  detachment    OS   Sepsis due to Streptococcus, group B (HCC) 10/05/2018   Past Surgical History:  Procedure Laterality Date   25 GAUGE PARS PLANA VITRECTOMY WITH 20 GAUGE MVR PORT FOR MACULAR HOLE Right 02/24/2019   Procedure: 25 GAUGE PARS PLANA VITRECTOMY WITH 20 GAUGE MVR PORT FOR MACULAR HOLE;  Surgeon: [name], MD;  Location: [facility] OR;  Service: Ophthalmology;  Laterality: Right;   APPLICATION OF WOUND VAC Left 08/27/2018   Procedure: APPLICATION OF WOUND VAC, left neck;  Surgeon: [name], MD;  Location: [facility] OR;  Service: Thoracic;  Laterality: Left;   APPLICATION OF WOUND VAC Left 08/30/2018   Procedure: APPLICATION OF WOUND VAC;  Surgeon: [name], MD;  Location: [facility] OR;  Service: Thoracic;  Laterality: Left;   AV FISTULA PLACEMENT Left 02/15/2020   Procedure: LEFT ARM ARTERIOVENOUS (AV) FISTULA CREATION;  Surgeon: [name], MD;  Location: [facility] OR;  Service: Vascular;  Laterality: Left;   CATARACT EXTRACTION Bilateral    CATARACT EXTRACTION W/ INTRAOCULAR LENS  IMPLANT, BILATERAL     COLONOSCOPY  06/24/2011   Procedure: COLONOSCOPY;  Surgeon: [name], MD;  Location: [facility] SUITE;  Service: Endoscopy;  Laterality: N/A;  8:30 AM   EYE SURGERY     GAS/FLUID EXCHANGE Left 03/31/2019   Procedure: Gas/Fluid Exchange;  Surgeon: [name], MD;  Location: [facility] OR;  Service: Ophthalmology;  Laterality: Left;   INJECTION OF SILICONE OIL  02/24/2019   Procedure: Injection Of Silicone Oil;  Surgeon: [name], MD;  Location: [facility] OR;  Service: Ophthalmology;;   INSERTION OF DIALYSIS CATHETER Right 12/05/2019   Procedure: INSERTION OF DIALYSIS CATHETER RIGHT SUBCLAVIAN;  Surgeon: [name], MD;  Location: [facility] ORS;  Service: General;  Laterality: Right;   INSERTION OF DIALYSIS CATHETER Right 12/07/2019   Procedure: Minor Dialysis catheter in place- need to reposition/ adjust catheter to help with flows;  Surgeon: [name], MD;  Location: [facility] ORS;   Service: General;  Laterality: Right;  procedure room case with 1% lidocaine, full sterile drape,  towels, full gown, large chloraprep, 4-0 Monocryl and dermabond    INSERTION OF DIALYSIS CATHETER Right 12/08/2019   Procedure: INSERTION OF DIALYSIS CATHETER EXCHANGE;  Surgeon: [name], MD;  Location: [facility] ORS;  Service: General;  Laterality: Right;   IR ANGIOGRAM EXTREMITY RIGHT  03/15/2021   IR RADIOLOGIST EVAL & MGMT  02/07/2021   IR RADIOLOGIST EVAL & MGMT  05/09/2021   IR US GUIDE VASC ACCESS RIGHT  03/15/2021   MEMBRANE PEEL Right 02/24/2019   Procedure: [place] Flax;  Surgeon: [name], MD;  Location: [facility] OR;  Service: Ophthalmology;  Laterality: Right;   PARS PLANA VITRECTOMY Left 03/31/2019   Procedure: PARS PLANA VITRECTOMY WITH 25 GAUGE WITH MEMBRANE PEEL;  Surgeon: [name], MD;  Location: [facility] OR;  Service:  Ophthalmology;  Laterality: Left;   PHOTOCOAGULATION WITH LASER Right 02/24/2019   Procedure: Photocoagulation With Laser;  Surgeon: [name], MD;  Location: [facility] OR;  Service: Ophthalmology;  Laterality: Right;   PHOTOCOAGULATION WITH LASER Left 03/31/2019   Procedure: Photocoagulation With Laser;  Surgeon: [name], MD;  Location: [facility] OR;  Service: Ophthalmology;  Laterality: Left;   RETINAL DETACHMENT SURGERY Left 03/31/2019   TRD Repair - Dr. [name]   SILICON OIL REMOVAL Right 03/31/2019   Procedure: Silicon Oil Removal;  Surgeon: [name], MD;  Location: [facility] OR;  Service: Ophthalmology;  Laterality: Right;   STERNAL WOUND DEBRIDEMENT Left 08/27/2018   Procedure: Incision and DEBRIDEMENT Left Chest, Neck and Mediastinum;  Surgeon: [name], MD;  Location: [facility] OR;  Service: Thoracic;  Laterality: Left;   STERNAL WOUND DEBRIDEMENT Left 08/30/2018   Procedure: WOUND VAC CHANGE, LEFT CHEST AND NECK, POSSIBLE DEBRIDEMENT;  Surgeon: [name], MD;  Location: [facility] OR;  Service: Thoracic;  Laterality: Left;   FAMILY HISTORY Family History  Problem  Relation Age of Onset   Hypertension Mother    Colon cancer Neg Hx    SOCIAL HISTORY Social History   Tobacco Use   Smoking status: Never   Smokeless tobacco: Never  Vaping Use   Vaping status: Never Used  Substance Use Topics   Alcohol use: No   Drug use: No       OPHTHALMIC EXAM:  Base Eye Exam     Visual Acuity (Snellen - Linear)       Right Left   Dist [place] 20/200 -2 20/200 -2   Dist ph [place] 20/150 -2 20/150 -2         Tonometry (Tonopen, 2:07 PM)       Right Left   Pressure 10 10         Pupils       Dark Light Shape React APD   Right 4 3 Irregular Brisk None   Left 4 3 Irregular Brisk None         Visual Fields (Counting fingers)       Left Right   Restrictions Partial outer superior temporal, inferior temporal, inferior nasal deficiencies Partial outer superior temporal, inferior temporal deficiencies         Extraocular Movement       Right Left    Full, Ortho Full, Ortho         Neuro/Psych     Oriented x3: Yes   Mood/Affect: Normal         Dilation     Both eyes: 1.0% Mydriacyl, 2.5% Phenylephrine @ 2:07 PM           Slit Lamp and Fundus Exam     Slit Lamp Exam       Right Left   Lids/Lashes mild Meibomian gland dysfunction mild Meibomian gland dysfunction   Conjunctiva/Sclera subconj silicon oil bubbles, Chemosis, conj Cyst White and quiet   Cornea Trace PEE, Well healed cataract wound, arcus, Debris in tear film Trace PEE, Well healed cataract wound, arcus, Debris in tear film   Anterior Chamber Deep and quiet, no cell or flare Deep and quiet, no cell or flare   Iris slightly irregular dilation, posterior synchiae from 3:00-1200 round, focal Posterior synechiae at 1030   Lens PC IOL in good position, pigment deposition, 1+ PCO, mild pigment deposition on optic PC IOL in good position, mild pigment deposition   Anterior Vitreous Post vit, silicon oil bubble ~  85% post vitrectomy, good silicone oil fill          Fundus Exam       Right Left   Disc 2+pallor, sharp rim, mild fibrosis along SN rim +pallor, sharp rim, fine NVD--regressed, mild fibrosis   C/D Ratio 0.2 0.2   Macula attached under oil, scattered MA/IRH -- improved, nasal and central thickening / edema -- improved attached under oil, persistent fibrosis, persistent edema -- improved, focal bands of PRF with traction emanating from ST macula -- stable, focal DBH temporal macula -- improved   Vessels attenuated, Tortuous Attenuated, dilated venules, Tortuous   Periphery Attached, dense 360 PRP laser, mild scattered fibrosis - persistent, Focal ret hole along distal IT arcades--good laser surrounding Attached, good 360 PRP laser changes, pre-retinal fibrosis extending to posterior PRP border superior and inferiorly           IMAGING AND PROCEDURES  Imaging and Procedures for @TODAY @  OCT, Retina - OU - Both Eyes       Right Eye Quality was good. Central Foveal Thickness: 536. Progression has improved. Findings include no SRF, abnormal foveal contour, epiretinal membrane, intraretinal fluid, outer retinal atrophy, preretinal fibrosis (Mild interval improvement in IRF/edema central and nasal macula ).   Left Eye Quality was good. Central Foveal Thickness: 468. Progression has improved. Findings include no SRF, abnormal foveal contour, intraretinal hyper-reflective material, epiretinal membrane, intraretinal fluid, preretinal fibrosis (Interval improvement in diffuse IRF/IRHM ).   Notes *Images captured and stored on drive  Diagnosis / Impression:  +DME under silicon oil OU OD: Mild interval improvement in IRF/edema central and nasal macula  OS: Interval improvement in diffuse IRF/IRHM   Clinical management:  See below  Abbreviations: NFP - Normal foveal profile. CME - cystoid macular edema. PED - pigment epithelial detachment. IRF - intraretinal fluid. SRF - subretinal fluid. EZ - ellipsoid zone. ERM - epiretinal membrane. ORA -  outer retinal atrophy. ORT - outer retinal tubulation. SRHM - subretinal hyper-reflective material      Intravitreal Injection, Pharmacologic Agent - OD - Right Eye       Time Out 04/10/2023. 2:12 PM. Confirmed correct patient, procedure, site, and patient consented.   Anesthesia Topical anesthesia was used. Anesthetic medications included Lidocaine 2%, Proparacaine 0.5%.   Procedure Preparation included 5% betadine to ocular surface, eyelid speculum. A (32g) needle was used.   Injection: 2 mg aflibercept 2 MG/0.05ML   Route: Intravitreal, Site: Right Eye   NDC: L6038910, Lot: [id number], Expiration date: 07/10/2024, Waste: 0 mL   Post-op Post injection exam found visual acuity of at least counting fingers. The patient tolerated the procedure well. There were no complications. The patient received written and verbal post procedure care education. Post injection medications were not given.   Notes An AC tap was performed following injection due to elevated IOP using a 30 gauge needle on a syringe with the plunger removed. The needle was placed at the limbus at 7 oclock and approximately 0.08cc of aqueous was removed from the anterior chamber. Betadine was applied to the tap area before and after the paracentesis was performed. There were no complications. The patient tolerated the procedure well. The IOP was rechecked and was found to be ~12 mmHg by tonopen.      Intravitreal Injection, Pharmacologic Agent - OS - Left Eye       Time Out 04/10/2023. 2:13 PM. Confirmed correct patient, procedure, site, and patient consented.   Anesthesia Topical anesthesia was used. Anesthetic medications included Lidocaine  2%, Proparacaine 0.5%.   Procedure Preparation included 5% betadine to ocular surface, eyelid speculum. A (32g) needle was used.   Injection: 6 mg faricimab-svoa 6 MG/0.05ML   Route: Intravitreal, Site: Left Eye   NDC: O8010301, Lot: [id number], Expiration date:  04/10/2025, Waste: 0 mL   Post-op Post injection exam found visual acuity of at least counting fingers. The patient tolerated the procedure well. There were no complications. The patient received written and verbal post procedure care education. Post injection medications were not given.            ASSESSMENT/PLAN:    ICD-10-CM   1. Proliferative diabetic retinopathy of both eyes with macular edema associated with type 2 diabetes mellitus (HCC)  E11.3513 OCT, Retina - OU - Both Eyes    Intravitreal Injection, Pharmacologic Agent - OD - Right Eye    Intravitreal Injection, Pharmacologic Agent - OS - Left Eye    faricimab-svoa (VABYSMO) 6mg /0.8mL intravitreal injection    aflibercept (EYLEA) SOLN 2 mg    2. Long term (current) use of oral hypoglycemic drugs  Z79.84     3. Long-term (current) use of injectable non-insulin antidiabetic drugs  Z79.85     4. 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 Vitreous hemorrhage of both eyes (HCC)  H43.13     6. Essential hypertension  I10     7. Hypertensive retinopathy of both eyes  H35.033     8. Pseudophakia of both eyes  Z96.1      1-5. Proliferative diabetic retinopathy with DME, TRD, and vitreous hemorrhage OU (OD > OS)  - last A1c: 8.0 on 06.04.24, 9.6 on 11.11.22  - lost to f/u from Jan 2021 to Oct 2021 due to loss of insurance coverage  - pt with complex medical history with hospitalization in Jan-Feb 2020 for bacteremia and abscess  - history of poor glycemic control for years  - s/p IVA OD #1 (06.20.20), #2 (07.01.20), #3 (09.11.20), #4 (10.09.20), #5 (11.06.20), #6 (11.24.21), #7 (12.22.21), #8 (01.19.22)  - s/p IVA OS #1 (06.20.20), #2 (07.01.20), #3 (08.13.20), #4 (09.11.20)  #5 (11.06.20), #6 (11.24.21), #7 (12.22.21), #8 (01.19.22) - s/p IVE OU #1 (12.04.20) - sample; #2 (01.06.21), #3 (02.18.22), #4 (03.18.22), #5 (04.18.22), #6 (05.25.22), #7 (07.01.22), #8 (07.29.22), #9 (09.02.22), #10 (09.30.22), #11  (11.04.22), #12 (12.09.23), #13 (01.13.23), #14 (03.02.23), #15 (04.11.23), #16 (06.01.23), #17 (07.11.23), #18 (09.12.23), #19 (11.14.23) - s/p IVE OD #20 (03.12.24), #21 (04.23.24), #22 (06.04.24), #23 (07.16.24) - s/p IVV OS #1 (03.12.24), #2 (04.23.24), #3 (06.04.24), #4 (07.16.24)  - s/p STK OS #1 (10.09.20)  - s/p PRP OS (06.10.20)  - s/p PPV/PFC/EL/FAX/silicone oil OD, 07.16.20  - s/p subconj silicon oil removal OD 8.20.20  - s/p PPV/EL/FAX/silicone oil OS, 08.20.20  - BCVA OD: 20/150 OU -- stable             - OCT shows OD: mild interval improvement in IRF/edema central and nasal macula; OS: Interval improvement in diffuse IRF/IRHMat 6 wks  - recommend IVE OD #24 and IVV OS #5 today, 08.30.24 with f/u in 6 weeks  - RBA of procedure discussed, questions answered  - IVV informed consent obtained and signed, 03.12.23 (OU)  - see procedure note             - [employer] benefits investigation begun 01.19.22 -- approved for 2024  - cont topical PF and Prolensa QID OU for possible CME component  - paper script for Prolensa given for  [employer] patient assistance program  - f/u 6 weeks -- DFE/OCT, possible injection(s)  6,7. Hypertensive retinopathy OU  - discussed importance of tight BP control  - continue to monitor  8. Pseudophakia OU  - s/p CE with IOL (Dr. Nile Riggs)  Ophthalmic Meds Ordered this visit:  Meds ordered this encounter  Medications   faricimab-svoa (VABYSMO) 6mg /0.50mL intravitreal injection   aflibercept (EYLEA) SOLN 2 mg     Return in about 6 weeks (around 05/22/2023) for f/u PDR OU, DFE, OCT.  There are no Patient Instructions on file for this visit.  This document serves as a record of services personally performed by Karie Chimera, MD, PhD. It was created on their behalf by Glee Arvin. Manson Passey, OA an ophthalmic technician. The creation of this record is the provider's dictation and/or activities during the visit.    Electronically signed by: Glee Arvin. Manson Passey, OA 04/10/23  2:44 PM  Karie Chimera, M.D., Ph.D. Diseases & Surgery of the Retina and Vitreous [facility]  I have reviewed the above documentation for accuracy and completeness, and I agree with the above. Karie Chimera, M.D., Ph.D. 04/10/23 2:45 PM  Abbreviations: M myopia (nearsighted); A astigmatism; H hyperopia (farsighted); P presbyopia; Mrx spectacle prescription;  CTL contact lenses; OD right eye; OS left eye; OU both eyes  XT exotropia; ET esotropia; PEK punctate epithelial keratitis; PEE punctate epithelial erosions; DES dry eye syndrome; MGD meibomian gland dysfunction; ATs artificial tears; PFAT's preservative free artificial tears; NSC nuclear sclerotic cataract; PSC posterior subcapsular cataract; ERM epi-retinal membrane; PVD posterior vitreous detachment; RD retinal detachment; DM diabetes mellitus; DR diabetic retinopathy; NPDR non-proliferative diabetic retinopathy; PDR proliferative diabetic retinopathy; CSME clinically significant macular edema; DME diabetic macular edema; dbh dot blot hemorrhages; CWS cotton wool spot; POAG primary open angle glaucoma; C/D cup-to-disc ratio; HVF humphrey visual field; GVF goldmann visual field; OCT optical coherence tomography; IOP intraocular pressure; BRVO Branch retinal vein occlusion; CRVO central retinal vein occlusion; CRAO central retinal artery occlusion; BRAO branch retinal artery occlusion; RT retinal tear; SB scleral buckle; PPV pars plana vitrectomy; VH Vitreous hemorrhage; PRP panretinal laser photocoagulation; IVK intravitreal kenalog; VMT vitreomacular traction; MH Macular hole;  NVD neovascularization of the disc; NVE neovascularization elsewhere; AREDS age related eye disease study; ARMD age related macular degeneration; POAG primary open angle glaucoma; EBMD epithelial/anterior basement membrane dystrophy; ACIOL anterior chamber intraocular lens; IOL intraocular lens; PCIOL posterior chamber intraocular lens; Phaco/IOL  phacoemulsification with intraocular lens placement; PRK photorefractive keratectomy; LASIK laser assisted in situ keratomileusis; HTN hypertension; DM diabetes mellitus; COPD chronic obstructive pulmonary disease

## 2023-04-10 ENCOUNTER — Encounter (INDEPENDENT_AMBULATORY_CARE_PROVIDER_SITE_OTHER): Payer: Self-pay | Admitting: Ophthalmology

## 2023-04-10 ENCOUNTER — Ambulatory Visit (INDEPENDENT_AMBULATORY_CARE_PROVIDER_SITE_OTHER): Payer: Medicare Other | Admitting: Ophthalmology

## 2023-04-10 DIAGNOSIS — Z7985 Long-term (current) use of injectable non-insulin antidiabetic drugs: Secondary | ICD-10-CM

## 2023-04-10 DIAGNOSIS — Z7984 Long term (current) use of oral hypoglycemic drugs: Secondary | ICD-10-CM

## 2023-04-10 DIAGNOSIS — H3343 Traction detachment of retina, bilateral: Secondary | ICD-10-CM

## 2023-04-10 DIAGNOSIS — I10 Essential (primary) hypertension: Secondary | ICD-10-CM

## 2023-04-10 DIAGNOSIS — Z961 Presence of intraocular lens: Secondary | ICD-10-CM

## 2023-04-10 DIAGNOSIS — E113513 Type 2 diabetes mellitus with proliferative diabetic retinopathy with macular edema, bilateral: Secondary | ICD-10-CM

## 2023-04-10 DIAGNOSIS — H4313 Vitreous hemorrhage, bilateral: Secondary | ICD-10-CM

## 2023-04-10 DIAGNOSIS — H35033 Hypertensive retinopathy, bilateral: Secondary | ICD-10-CM

## 2023-04-10 MED ORDER — AFLIBERCEPT 2MG/0.05ML IZ SOLN FOR KALEIDOSCOPE
2.0000 mg | INTRAVITREAL | Status: AC | PRN
Start: 2023-04-10 — End: 2023-04-10
  Administered 2023-04-10: 2 mg via INTRAVITREAL

## 2023-04-10 MED ORDER — FARICIMAB-SVOA 6 MG/0.05ML IZ SOLN
6.0000 mg | INTRAVITREAL | Status: AC | PRN
Start: 2023-04-10 — End: 2023-04-10
  Administered 2023-04-10: 6 mg via INTRAVITREAL

## 2023-04-19 ENCOUNTER — Encounter: Payer: Self-pay | Admitting: Emergency Medicine

## 2023-04-19 ENCOUNTER — Encounter (HOSPITAL_COMMUNITY): Payer: Self-pay

## 2023-04-19 ENCOUNTER — Other Ambulatory Visit: Payer: Self-pay

## 2023-04-19 ENCOUNTER — Ambulatory Visit
Admission: EM | Admit: 2023-04-19 | Discharge: 2023-04-19 | Disposition: A | Discharge status: Home / Self Care | Payer: Medicare Other | Attending: Physician Assistant | Admitting: Physician Assistant

## 2023-04-19 ENCOUNTER — Emergency Department (HOSPITAL_COMMUNITY)
Admission: EM | Admit: 2023-04-19 | Discharge: 2023-04-19 | Disposition: A | Discharge status: Home / Self Care | Payer: Medicare Other | Attending: Student | Admitting: Student

## 2023-04-19 DIAGNOSIS — R509 Fever, unspecified: Secondary | ICD-10-CM | POA: Diagnosis not present

## 2023-04-19 DIAGNOSIS — U071 COVID-19: Secondary | ICD-10-CM | POA: Insufficient documentation

## 2023-04-19 DIAGNOSIS — R000 Tachycardia, unspecified: Secondary | ICD-10-CM

## 2023-04-19 DIAGNOSIS — Z79899 Other long term (current) drug therapy: Secondary | ICD-10-CM | POA: Diagnosis not present

## 2023-04-19 DIAGNOSIS — E11319 Type 2 diabetes mellitus with unspecified diabetic retinopathy without macular edema: Secondary | ICD-10-CM | POA: Insufficient documentation

## 2023-04-19 DIAGNOSIS — N186 End stage renal disease: Secondary | ICD-10-CM | POA: Insufficient documentation

## 2023-04-19 DIAGNOSIS — Z794 Long term (current) use of insulin: Secondary | ICD-10-CM | POA: Insufficient documentation

## 2023-04-19 DIAGNOSIS — E039 Hypothyroidism, unspecified: Secondary | ICD-10-CM | POA: Diagnosis not present

## 2023-04-19 DIAGNOSIS — Z992 Dependence on renal dialysis: Secondary | ICD-10-CM | POA: Diagnosis not present

## 2023-04-19 DIAGNOSIS — I120 Hypertensive chronic kidney disease with stage 5 chronic kidney disease or end stage renal disease: Secondary | ICD-10-CM | POA: Insufficient documentation

## 2023-04-19 DIAGNOSIS — E1122 Type 2 diabetes mellitus with diabetic chronic kidney disease: Secondary | ICD-10-CM | POA: Diagnosis not present

## 2023-04-19 DIAGNOSIS — R6510 Systemic inflammatory response syndrome (SIRS) of non-infectious origin without acute organ dysfunction: Secondary | ICD-10-CM | POA: Diagnosis not present

## 2023-04-19 LAB — CBC WITH DIFFERENTIAL/PLATELET
Abs Immature Granulocytes: 0.04 10*3/uL (ref 0.00–0.07)
Basophils Absolute: 0 10*3/uL (ref 0.0–0.1)
Basophils Relative: 0 %
Eosinophils Absolute: 0 10*3/uL (ref 0.0–0.5)
Eosinophils Relative: 0 %
HCT: 39.4 % (ref 39.0–52.0)
Hemoglobin: 12.9 g/dL — ABNORMAL LOW (ref 13.0–17.0)
Immature Granulocytes: 1 %
Lymphocytes Relative: 12 %
Lymphs Abs: 0.8 10*3/uL (ref 0.7–4.0)
MCH: 28.9 pg (ref 26.0–34.0)
MCHC: 32.7 g/dL (ref 30.0–36.0)
MCV: 88.1 fL (ref 80.0–100.0)
Monocytes Absolute: 0.8 10*3/uL (ref 0.1–1.0)
Monocytes Relative: 12 %
Neutro Abs: 5.5 10*3/uL (ref 1.7–7.7)
Neutrophils Relative %: 75 %
Platelets: 217 10*3/uL (ref 150–400)
RBC: 4.47 MIL/uL (ref 4.22–5.81)
RDW: 15.8 % — ABNORMAL HIGH (ref 11.5–15.5)
WBC: 7.2 10*3/uL (ref 4.0–10.5)
nRBC: 0 % (ref 0.0–0.2)

## 2023-04-19 LAB — COMPREHENSIVE METABOLIC PANEL
ALT: 25 U/L (ref 0–44)
AST: 30 U/L (ref 15–41)
Albumin: 3.6 g/dL (ref 3.5–5.0)
Alkaline Phosphatase: 81 U/L (ref 38–126)
Anion gap: 18 — ABNORMAL HIGH (ref 5–15)
BUN: 73 mg/dL — ABNORMAL HIGH (ref 6–20)
CO2: 24 mmol/L (ref 22–32)
Calcium: 8.7 mg/dL — ABNORMAL LOW (ref 8.9–10.3)
Chloride: 89 mmol/L — ABNORMAL LOW (ref 98–111)
Creatinine, Ser: 12.31 mg/dL — ABNORMAL HIGH (ref 0.61–1.24)
GFR, Estimated: 4 mL/min — ABNORMAL LOW (ref >60)
Glucose, Bld: 281 mg/dL — ABNORMAL HIGH (ref 70–99)
Potassium: 4.8 mmol/L (ref 3.5–5.1)
Sodium: 131 mmol/L — ABNORMAL LOW (ref 135–145)
Total Bilirubin: 0.9 mg/dL (ref 0.3–1.2)
Total Protein: 8.6 g/dL — ABNORMAL HIGH (ref 6.5–8.1)

## 2023-04-19 LAB — SARS CORONAVIRUS 2 BY RT PCR: SARS Coronavirus 2 by RT PCR: POSITIVE — AB

## 2023-04-19 LAB — POCT FASTING CBG KUC MANUAL ENTRY: POCT Glucose (KUC): 203 mg/dL — AB (ref 70–99)

## 2023-04-19 MED ORDER — LACTATED RINGERS IV BOLUS
500.0000 mL | Freq: Once | INTRAVENOUS | Status: AC
  Administered 2023-04-19: 500 mL via INTRAVENOUS

## 2023-04-19 NOTE — ED Provider Notes (Signed)
[facility]    CSN: [id number] Arrival date & time: 04/19/23  1005      History   Chief Complaint Chief Complaint  Patient presents with   Chills    HPI Randy Oneal is a 57 y.o. male.   Patient presents today companied by his wife who help provide majority of history.  Reports a several week history of feeling poorly particularly after his dialysis treatment.  He receives dialysis on Monday, Wednesday, Friday.  He reports that several weeks ago he was feeling poorly and missed a treatment but has not missed any of the more recent treatments.  He does believe that they decreased his dry weight and to have been playing more fluid off and wonder if this could be contributing to symptoms.  He did discuss his symptoms with dialysis and they tested him for COVID and this was negative.  He reports generalized weakness, decreased appetite, nausea, abdominal upset, low-grade fever, chills, rigors.  He denies any chest pain, increased swelling, shortness of breath.  He does report increased thirst but does have a history of diabetes.  Reports compliance with his medication.  He has had a decreased urine output; despite being on dialysis he usually urinates 3 times a day and has only been urinating once every few days since his symptoms began.  Reports that he has not been eating regularly with his last regular meal 2 days ago.    Past Medical History:  Diagnosis Date   Anemia    Cataract 07/02/2020   Diabetes mellitus    Type 2    Diabetic retinopathy of both eyes (HCC) 01/31/2019   [name] 01/2019   ESRD (end stage renal disease) (HCC)    [place] Frenis[place]   Herpes simplex virus (HSV) infection    OU   Hypertension    Hypertensive retinopathy    OU   Retinal detachment    OS   Sepsis due to Streptococcus, group B (HCC) 10/05/2018    Patient Active Problem List   Diagnosis Date Noted   Diabetic ulcer of right midfoot associated with diabetes mellitus due to  underlying condition, with fat layer exposed (HCC) 04/17/2022   COVID-19 10/23/2021   Other fluid overload 07/30/2021   Nausea 05/22/2021   Peripheral neuropathy 12/19/2020   Type 2 diabetes mellitus with retinopathy, with long-term current use of insulin (HCC) 07/12/2020   Type 2 diabetes mellitus with chronic kidney disease on chronic dialysis, with long-term current use of insulin (HCC) 07/12/2020   Type 2 diabetes mellitus with diabetic neuropathy, with long-term current use of insulin (HCC) 07/12/2020   Bilateral retinal detachment 07/02/2020   Blurry vision, bilateral 07/02/2020   Cataract 07/02/2020   ESRD on hemodialysis (HCC) 07/02/2020   Gastroesophageal reflux disease without esophagitis 07/02/2020   History of Bell's palsy 07/02/2020   Hyperlipidemia 07/02/2020   Personal history of septic arthritis 07/02/2020   Hypercalcemia 03/05/2020   Allergy, unspecified, initial encounter 02/29/2020   Anaphylactic shock, unspecified, initial encounter 02/29/2020   Hypothyroidism, unspecified 12/10/2019   Coagulation defect, unspecified (HCC) 12/09/2019   Diarrhea, unspecified 12/09/2019   Dyspnea, unspecified 12/09/2019   Iron deficiency anemia, unspecified 12/09/2019   Pain, unspecified 12/09/2019   Pruritus, unspecified 12/09/2019   Secondary hyperparathyroidism of renal origin (HCC) 12/09/2019   Displacement of vascular dialysis catheter (HCC)    Acute on chronic renal failure (HCC) 12/01/2019   End stage kidney disease (HCC)    Skin callus 08/03/2019   Pain due  to onychomycosis of toenails of both feet 04/20/2019   Posterior tibial tendon dysfunction (PTTD) of both lower extremities 04/20/2019   Diabetic neuropathy (HCC) 04/20/2019   Diabetic retinopathy of both eyes (HCC) 01/31/2019   Type 2 diabetes mellitus with hyperglycemia, with long-term current use of insulin (HCC) 10/20/2018   Sepsis due to Streptococcus, group B (HCC) 10/05/2018   Dietary counseling and  surveillance 09/15/2018   Non compliance with medical treatment 09/15/2018   Open wound of right great toe    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/27/2018   Cellulitis of great toe, right    Diabetic hyperosmolar non-ketotic state (HCC) 08/17/2018   Hyperglycemia 08/17/2018   AKI (acute kidney injury) (HCC) 08/16/2018   Hyponatremia 08/16/2018   Hyperlipidemia associated with type 2 diabetes mellitus (HCC) 03/04/2017   Erectile dysfunction 03/04/2017   Personal history of noncompliance with medical treatment, presenting hazards to health 11/22/2015   Essential hypertension 01/30/2014   Loss of weight 01/30/2014   Type 2 diabetes mellitus not at goal [facility]) 03/30/2013    Past Surgical History:  Procedure Laterality Date   25 GAUGE PARS PLANA VITRECTOMY WITH 20 GAUGE MVR PORT FOR MACULAR HOLE Right 02/24/2019   Procedure: 25 GAUGE PARS PLANA VITRECTOMY WITH 20 GAUGE MVR PORT FOR MACULAR HOLE;  Surgeon: [name], MD;  Location[facility] OR;  Service: Ophthalmology;  Laterality: Right;   APPLICATION OF WOUND VAC Left 08/27/2018   Procedure: APPLICATION OF WOUND VAC, left neck;  Surgeon: [name], MD;  Location: [facility] OR;  Service: Thoracic;  Laterality: Left;   APPLICATION OF WOUND VAC Left 08/30/2018   Procedure: APPLICATION OF WOUND VAC;  Surgeon: [name], MD;  Location: [facility] OR;  Service: Thoracic;  Laterality: Left;   AV FISTULA PLACEMENT Left 02/15/2020   Procedure: LEFT ARM ARTERIOVENOUS (AV) FISTULA CREATION;  Surgeon: [name], MD;  Location: [facility] OR;  Service: Vascular;  Laterality: Left;   CATARACT EXTRACTION Bilateral    CATARACT EXTRACTION W/ INTRAOCULAR LENS  IMPLANT, BILATERAL     COLONOSCOPY  06/24/2011   Procedure: COLONOSCOPY;  Surgeon: [name], MD;  Location: [facility] SUITE;  Service: Endoscopy;  Laterality: N/A;  8:30 AM   EYE SURGERY     GAS/FLUID EXCHANGE Left 03/31/2019   Procedure: Gas/Fluid Exchange;  Surgeon: [name], MD;  Location: [facility] OR;   Service: Ophthalmology;  Laterality: Left;   INJECTION OF SILICONE OIL  02/24/2019   Procedure: Injection Of Silicone Oil;  Surgeon: [name], MD;  Location: [facility] OR;  Service: Ophthalmology;;   INSERTION OF DIALYSIS CATHETER Right 12/05/2019   Procedure: INSERTION OF DIALYSIS CATHETER RIGHT SUBCLAVIAN;  Surgeon: [name], MD;  Location: [facility] ORS;  Service: General;  Laterality: Right;   INSERTION OF DIALYSIS CATHETER Right 12/07/2019   Procedure: Minor Dialysis catheter in place- need to reposition/ adjust catheter to help with flows;  Surgeon: [name], MD;  Location: [facility] ORS;  Service: General;  Laterality: Right;  procedure room case with 1% lidocaine, full sterile drape,  towels, full gown, large chloraprep, 4-0 Monocryl and dermabond    INSERTION OF DIALYSIS CATHETER Right 12/08/2019   Procedure: INSERTION OF DIALYSIS CATHETER EXCHANGE;  Surgeon: [name], MD;  Location: [facility] ORS;  Service: General;  Laterality: Right;   IR ANGIOGRAM EXTREMITY RIGHT  03/15/2021   IR RADIOLOGIST EVAL & MGMT  02/07/2021   IR RADIOLOGIST EVAL & MGMT  05/09/2021   IR US GUIDE VASC ACCESS RIGHT  03/15/2021  MEMBRANE PEEL Right 02/24/2019   Procedure: [place] Flax;  Surgeon: [name], MD;  Location: [facility] OR;  Service: Ophthalmology;  Laterality: Right;   PARS PLANA VITRECTOMY Left 03/31/2019   Procedure: PARS PLANA VITRECTOMY WITH 25 GAUGE WITH MEMBRANE PEEL;  Surgeon: [name], MD;  Location: [facility] OR;  Service: Ophthalmology;  Laterality: Left;   PHOTOCOAGULATION WITH LASER Right 02/24/2019   Procedure: Photocoagulation With Laser;  Surgeon: [name], MD;  Location: [facility] OR;  Service: Ophthalmology;  Laterality: Right;   PHOTOCOAGULATION WITH LASER Left 03/31/2019   Procedure: Photocoagulation With Laser;  Surgeon: [name], MD;  Location: [facility] OR;  Service: Ophthalmology;  Laterality: Left;   RETINAL DETACHMENT SURGERY Left 03/31/2019   TRD Repair - Dr. [name]   SILICON OIL  REMOVAL Right 03/31/2019   Procedure: Silicon Oil Removal;  Surgeon: [name], MD;  Location: [facility] OR;  Service: Ophthalmology;  Laterality: Right;   STERNAL WOUND DEBRIDEMENT Left 08/27/2018   Procedure: Incision and DEBRIDEMENT Left Chest, Neck and Mediastinum;  Surgeon: [name], MD;  Location: [facility] OR;  Service: Thoracic;  Laterality: Left;   STERNAL WOUND DEBRIDEMENT Left 08/30/2018   Procedure: WOUND VAC CHANGE, LEFT CHEST AND NECK, POSSIBLE DEBRIDEMENT;  Surgeon: [name], MD;  Location: [facility] OR;  Service: Thoracic;  Laterality: Left;       Home Medications    Prior to Admission medications   Medication Sig Start Date End Date Taking? Authorizing Provider  acetaminophen (TYLENOL) 500 MG tablet Take 1,000 mg by mouth every 6 (six) hours as needed for moderate pain or headache.    [provider]  amLODipine (NORVASC) 2.5 MG tablet Take 1 tablet (2.5 mg total) by mouth daily. 04/02/22   [name], MD  atorvastatin (LIPITOR) 10 MG tablet Take 1 tablet by mouth daily. 01/26/18   [provider]  blood glucose meter kit and supplies Use to check sugars 3 times daily 04/02/22   [name], MD  Bromfenac Sodium (PROLENSA) 0.07 % SOLN Place 1 drop into both eyes 4 (four) times daily. 01/13/23   [name], MD  Cholecalciferol 1.25 MG (50000 UT) capsule Take by mouth. 09/05/21   [provider]  cinacalcet (SENSIPAR) 30 MG tablet Take 30 mg by mouth daily with supper. 04/12/20   [provider]  Continuous Blood Gluc Sensor (DEXCOM G7 SENSOR) MISC 1 Device by Does not apply route as directed. 08/27/22   Shamleffer, Konrad Dolores, MD  glucose blood (ONETOUCH ULTRA) test strip 1 each by Other route in the morning, at noon, in the evening, and at bedtime. Use as instructed 10/10/20   Shamleffer, Konrad Dolores, MD  insulin aspart (NOVOLOG FLEXPEN) 100 UNIT/ML FlexPen 7 units with breakfast and lunch, and 8 units with supper CF NovoLog(BG -130/40)   Max daily 45 units 01/13/23   Shamleffer, Konrad Dolores, MD  insulin degludec (TRESIBA FLEXTOUCH) 100 UNIT/ML FlexTouch Pen Inject 22 Units into the skin at bedtime. 01/13/23   Shamleffer, Konrad Dolores, MD  Insulin Pen Needle 32G X 4 MM MISC 1 Device by Does not apply route in the morning, at noon, in the evening, and at bedtime. 01/13/23   Shamleffer, Konrad Dolores, MD  lidocaine-prilocaine (EMLA) cream Apply 1 application topically Every Tuesday,Thursday,and Saturday with dialysis. 05/21/20   [provider]  MICROLET LANCETS MISC USE FOUR TIMES A DAY AS DIRECTED. 11/19/16   Roma Kayser, MD  nystatin (MYCOSTATIN/NYSTOP) powder Apply topically 2 (two) times daily. To  groin and scrotum 09/04/18   Barrett, Caprina Wussow R, PA-C  prednisoLONE acetate (PRED FORTE) 1 % ophthalmic suspension PLACE (1) DROP INTO BOTH EYES FOUR TIMES DAILY 09/18/22   [name], MD  sildenafil (VIAGRA) 50 MG tablet Take by mouth. 02/26/22   [provider]  sucroferric oxyhydroxide (VELPHORO) 500 MG chewable tablet Chew 1,000 mg by mouth 3 (three) times daily with meals.    [provider]    Family History Family History  Problem Relation Age of Onset   Hypertension Mother    Colon cancer Neg Hx     Social History Social History   Tobacco Use   Smoking status: Never   Smokeless tobacco: Never  Vaping Use   Vaping status: Never Used  Substance Use Topics   Alcohol use: No   Drug use: No     Allergies   Tramadol   Review of Systems Review of Systems  Constitutional:  Positive for activity change, fatigue and fever. Negative for appetite change.  HENT:  Positive for rhinorrhea. Negative for congestion, sinus pressure, sneezing and sore throat.   Respiratory:  Negative for cough and shortness of breath.   Cardiovascular:  Negative for chest pain.  Gastrointestinal:  Positive for abdominal pain and nausea. Negative for diarrhea and vomiting.  Neurological:  Positive for  headaches. Negative for dizziness and light-headedness.     Physical Exam Triage Vital Signs ED Triage Vitals  Encounter Vitals Group     BP 04/19/23 1037 (!) 177/95     Systolic BP Percentile --      Diastolic BP Percentile --      Pulse Rate 04/19/23 1037 (!) 104     Resp 04/19/23 1037 20     Temp 04/19/23 1037 100 F (37.8 C)     Temp Source 04/19/23 1037 Oral     SpO2 04/19/23 1037 96 %     Weight --      Height --      Head Circumference --      Peak Flow --      Pain Score 04/19/23 1035 0     Pain Loc --      Pain Education --      Exclude from Growth Chart --    No data found.  Updated Vital Signs BP (!) 177/95 (BP Location: Right Arm)   Pulse (!) 104   Temp 100 F (37.8 C) (Oral)   Resp 20   Wt 198 lb 6.4 oz (90 kg)   SpO2 96%   BMI 27.67 kg/m   Visual Acuity Right Eye Distance:   Left Eye Distance:   Bilateral Distance:    Right Eye Near:   Left Eye Near:    Bilateral Near:     Physical Exam Vitals reviewed.  Constitutional:      General: He is awake.     Appearance: Normal appearance. He is well-developed. He is ill-appearing.     Comments: Very pleasant male appears stated age wrapped in a towel curled up on the examining table but in no acute distress  HENT:     Head: Normocephalic and atraumatic.     Right Ear: Tympanic membrane, ear canal and external ear normal. Tympanic membrane is not erythematous or bulging.     Left Ear: Tympanic membrane, ear canal and external ear normal. Tympanic membrane is not erythematous or bulging.     Nose: Rhinorrhea present. Rhinorrhea is clear.     Mouth/Throat:  Pharynx: Uvula midline. No oropharyngeal exudate, posterior oropharyngeal erythema or uvula swelling.  Cardiovascular:     Rate and Rhythm: Regular rhythm. Tachycardia present.     Heart sounds: Normal heart sounds, S1 normal and S2 normal. No murmur heard. Pulmonary:     Effort: Pulmonary effort is normal. No accessory muscle usage or  respiratory distress.     Breath sounds: Normal breath sounds. No stridor. No wheezing, rhonchi or rales.     Comments: Clear to auscultation bilaterally Abdominal:     General: Bowel sounds are normal.     Palpations: Abdomen is soft.     Tenderness: There is generalized abdominal tenderness. There is no right CVA tenderness, left CVA tenderness, guarding or rebound.     Comments: Mild tenderness palpation throughout abdomen without evidence of acute abdomen.  Musculoskeletal:     Right lower leg: No edema.     Left lower leg: No edema.  Neurological:     Mental Status: He is alert.  Psychiatric:        Behavior: Behavior is cooperative.      UC Treatments / Results  Labs (all labs ordered are listed, but only abnormal results are displayed) Labs Reviewed  POCT FASTING CBG [facility] MANUAL ENTRY - Abnormal; Notable for the following components:      Result Value   POCT Glucose ([facility]) 203 (*)    All other components within normal limits    EKG   Radiology No results found.  Procedures Procedures (including critical care time)  Medications Ordered in UC Medications - No data to display  Initial Impression / Assessment and Plan / UC Course  I have reviewed the triage vital signs and the nursing notes.  Pertinent labs & imaging results that were available during my care of the patient were reviewed by me and considered in my medical decision making (see chart for details).     Patient is tachycardic with low-grade fever meeting SIRS criteria.  His blood sugar was elevated 203 but we discussed that this is not likely the sole cause of his symptoms given his clinical presentation.  Given his past medical history including history of sepsis I did recommend that he go to the emergency room for further evaluation and management since any workup initiated by our clinic will take several days to complete.  He is agreeable and his wife will take him directly to the emergency room for  further evaluation and management.  He was stable at time of discharge and safe for private transport.  Final Clinical Impressions(s) / UC Diagnoses   Final diagnoses:  SIRS (systemic inflammatory response syndrome) (HCC)  Fever, unspecified  Tachycardia     Discharge Instructions      I would like you to go to the emergency room to get checked out because I am concerned that you are developing a severe infection.  Please go directly there for further evaluation and management.     ED Prescriptions   None    PDMP not reviewed this encounter.   Jeani Hawking, PA-C 04/19/23 1101

## 2023-04-19 NOTE — ED Notes (Signed)
Called report to [facility] ED, spoke with Linda,RN.

## 2023-04-19 NOTE — ED Notes (Signed)
Pt aware need urine sample. Unable to provide sample at this time.

## 2023-04-19 NOTE — ED Provider Notes (Signed)
[place] EMERGENCY DEPARTMENT AT [facility] Provider Note  CSN: [id number] Arrival date & time: 04/19/23 1133  Chief Complaint(s) Fever  HPI Randy Oneal is a 57 y.o. male with PMH ESRD on hemodialysis Monday Wednesday Friday, T2DM who presents emergency department for evaluation of fever, chills, myalgias fatigue.  He states that over the course of this week after finishing dialysis he has been feeling very poorly.  He feels that they may be pulling off a little bit too much fluid during the sessions.  Over the last 48 hours he started to develop chills and generalized myalgias with decreased p.o. intake.  Denies chest pain, shortness of breath, headache, vomiting or other systemic symptoms.   Past Medical History Past Medical History:  Diagnosis Date   Anemia    Cataract 07/02/2020   Diabetes mellitus    Type 2    Diabetic retinopathy of both eyes (HCC) 01/31/2019   [name] 01/2019   ESRD (end stage renal disease) (HCC)    [place] Frenisus   Herpes simplex virus (HSV) infection    OU   Hypertension    Hypertensive retinopathy    OU   Retinal detachment    OS   Sepsis due to Streptococcus, group B (HCC) 10/05/2018   Patient Active Problem List   Diagnosis Date Noted   Diabetic ulcer of right midfoot associated with diabetes mellitus due to underlying condition, with fat layer exposed (HCC) 04/17/2022   COVID-19 10/23/2021   Other fluid overload 07/30/2021   Nausea 05/22/2021   Peripheral neuropathy 12/19/2020   Type 2 diabetes mellitus with retinopathy, with long-term current use of insulin (HCC) 07/12/2020   Type 2 diabetes mellitus with chronic kidney disease on chronic dialysis, with long-term current use of insulin (HCC) 07/12/2020   Type 2 diabetes mellitus with diabetic neuropathy, with long-term current use of insulin (HCC) 07/12/2020   Bilateral retinal detachment 07/02/2020   Blurry vision, bilateral 07/02/2020   Cataract 07/02/2020   ESRD on  hemodialysis (HCC) 07/02/2020   Gastroesophageal reflux disease without esophagitis 07/02/2020   History of Bell's palsy 07/02/2020   Hyperlipidemia 07/02/2020   Personal history of septic arthritis 07/02/2020   Hypercalcemia 03/05/2020   Allergy, unspecified, initial encounter 02/29/2020   Anaphylactic shock, unspecified, initial encounter 02/29/2020   Hypothyroidism, unspecified 12/10/2019   Coagulation defect, unspecified (HCC) 12/09/2019   Diarrhea, unspecified 12/09/2019   Dyspnea, unspecified 12/09/2019   Iron deficiency anemia, unspecified 12/09/2019   Pain, unspecified 12/09/2019   Pruritus, unspecified 12/09/2019   Secondary hyperparathyroidism of renal origin (HCC) 12/09/2019   Displacement of vascular dialysis catheter (HCC)    Acute on chronic renal failure (HCC) 12/01/2019   End stage kidney disease (HCC)    Skin callus 08/03/2019   Pain due to onychomycosis of toenails of both feet 04/20/2019   Posterior tibial tendon dysfunction (PTTD) of both lower extremities 04/20/2019   Diabetic neuropathy (HCC) 04/20/2019   Diabetic retinopathy of both eyes (HCC) 01/31/2019   Type 2 diabetes mellitus with hyperglycemia, with long-term current use of insulin (HCC) 10/20/2018   Sepsis due to Streptococcus, group B (HCC) 10/05/2018   Dietary counseling and surveillance 09/15/2018   Non compliance with medical treatment 09/15/2018   Open wound of right great toe    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/27/2018   Cellulitis of great toe, right    Diabetic hyperosmolar non-ketotic state (HCC) 08/17/2018   Hyperglycemia 08/17/2018   AKI (acute kidney injury) (HCC) 08/16/2018   Hyponatremia 08/16/2018  Hyperlipidemia associated with type 2 diabetes mellitus (HCC) 03/04/2017   Erectile dysfunction 03/04/2017   Personal history of noncompliance with medical treatment, presenting hazards to health 11/22/2015   Essential hypertension 01/30/2014   Loss of weight 01/30/2014   Type 2 diabetes  mellitus not at goal [facility]) 03/30/2013   Home Medication(s) Prior to Admission medications   Medication Sig Start Date End Date Taking? Authorizing Provider  acetaminophen (TYLENOL) 500 MG tablet Take 1,000 mg by mouth every 6 (six) hours as needed for moderate pain or headache.    [provider]  amLODipine (NORVASC) 2.5 MG tablet Take 1 tablet (2.5 mg total) by mouth daily. 04/02/22   [name], MD  atorvastatin (LIPITOR) 10 MG tablet Take 1 tablet by mouth daily. 01/26/18   [provider]  blood glucose meter kit and supplies Use to check sugars 3 times daily 04/02/22   [name], MD  Bromfenac Sodium (PROLENSA) 0.07 % SOLN Place 1 drop into both eyes 4 (four) times daily. 01/13/23   [name], MD  Cholecalciferol 1.25 MG (50000 UT) capsule Take by mouth. 09/05/21   [provider]  cinacalcet (SENSIPAR) 30 MG tablet Take 30 mg by mouth daily with supper. 04/12/20   [provider]  Continuous Blood Gluc Sensor (DEXCOM G7 SENSOR) MISC 1 Device by Does not apply route as directed. 08/27/22   Shamleffer, Konrad Dolores, MD  glucose blood (ONETOUCH ULTRA) test strip 1 each by Other route in the morning, at noon, in the evening, and at bedtime. Use as instructed 10/10/20   Shamleffer, Konrad Dolores, MD  insulin aspart (NOVOLOG FLEXPEN) 100 UNIT/ML FlexPen 7 units with breakfast and lunch, and 8 units with supper CF NovoLog(BG -130/40)  Max daily 45 units 01/13/23   Shamleffer, Konrad Dolores, MD  insulin degludec (TRESIBA FLEXTOUCH) 100 UNIT/ML FlexTouch Pen Inject 22 Units into the skin at bedtime. 01/13/23   Shamleffer, Konrad Dolores, MD  Insulin Pen Needle 32G X 4 MM MISC 1 Device by Does not apply route in the morning, at noon, in the evening, and at bedtime. 01/13/23   Shamleffer, Konrad Dolores, MD  lidocaine-prilocaine (EMLA) cream Apply 1 application topically Every Tuesday,Thursday,and Saturday with dialysis. 05/21/20   [provider]   MICROLET LANCETS MISC USE FOUR TIMES A DAY AS DIRECTED. 11/19/16   Roma Kayser, MD  nystatin (MYCOSTATIN/NYSTOP) powder Apply topically 2 (two) times daily. To groin and scrotum 09/04/18   Barrett, Erin R, PA-C  prednisoLONE acetate (PRED FORTE) 1 % ophthalmic suspension PLACE (1) DROP INTO BOTH EYES FOUR TIMES DAILY 09/18/22   [name], MD  sildenafil (VIAGRA) 50 MG tablet Take by mouth. 02/26/22   [provider]  sucroferric oxyhydroxide (VELPHORO) 500 MG chewable tablet Chew 1,000 mg by mouth 3 (three) times daily with meals.    [provider]  Past Surgical History Past Surgical History:  Procedure Laterality Date   25 GAUGE PARS PLANA VITRECTOMY WITH 20 GAUGE MVR PORT FOR MACULAR HOLE Right 02/24/2019   Procedure: 25 GAUGE PARS PLANA VITRECTOMY WITH 20 GAUGE MVR PORT FOR MACULAR HOLE;  Surgeon: [name], MD;  Location: [facility] OR;  Service: Ophthalmology;  Laterality: Right;   APPLICATION OF WOUND VAC Left 08/27/2018   Procedure: APPLICATION OF WOUND VAC, left neck;  Surgeon: [name], MD;  Location: [facility] OR;  Service: Thoracic;  Laterality: Left;   APPLICATION OF WOUND VAC Left 08/30/2018   Procedure: APPLICATION OF WOUND VAC;  Surgeon: [name], MD;  Location: [facility] OR;  Service: Thoracic;  Laterality: Left;   AV FISTULA PLACEMENT Left 02/15/2020   Procedure: LEFT ARM ARTERIOVENOUS (AV) FISTULA CREATION;  Surgeon: [name], MD;  Location[facility] OR;  Service: Vascular;  Laterality: Left;   CATARACT EXTRACTION Bilateral    CATARACT EXTRACTION W/ INTRAOCULAR LENS  IMPLANT, BILATERAL     COLONOSCOPY  06/24/2011   Procedure: COLONOSCOPY;  Surgeon: [name], MD;  Location: [facility] ENDO SUITE;  Service: Endoscopy;  Laterality: N/A;  8:30 AM   EYE SURGERY     GAS/FLUID EXCHANGE Left 03/31/2019   Procedure: Gas/Fluid  Exchange;  Surgeon: [name], MD;  Location: [facility] OR;  Service: Ophthalmology;  Laterality: Left;   INJECTION OF SILICONE OIL  02/24/2019   Procedure: Injection Of Silicone Oil;  Surgeon: [name], MD;  Location: [facility] OR;  Service: Ophthalmology;;   INSERTION OF DIALYSIS CATHETER Right 12/05/2019   Procedure: INSERTION OF DIALYSIS CATHETER RIGHT SUBCLAVIAN;  Surgeon: [name], MD;  Location: [facility] ORS;  Service: General;  Laterality: Right;   INSERTION OF DIALYSIS CATHETER Right 12/07/2019   Procedure: Minor Dialysis catheter in place- need to reposition/ adjust catheter to help with flows;  Surgeon: [name], MD;  Location: [facility] ORS;  Service: General;  Laterality: Right;  procedure room case with 1% lidocaine, full sterile drape,  towels, full gown, large chloraprep, 4-0 Monocryl and dermabond    INSERTION OF DIALYSIS CATHETER Right 12/08/2019   Procedure: INSERTION OF DIALYSIS CATHETER EXCHANGE;  Surgeon: [name], MD;  Location: [facility] ORS;  Service: General;  Laterality: Right;   IR ANGIOGRAM EXTREMITY RIGHT  03/15/2021   IR RADIOLOGIST EVAL & MGMT  02/07/2021   IR RADIOLOGIST EVAL & MGMT  05/09/2021   IR US GUIDE VASC ACCESS RIGHT  03/15/2021   MEMBRANE PEEL Right 02/24/2019   Procedure: Eula Flax;  Surgeon: [name], MD;  Location: [facility] OR;  Service: Ophthalmology;  Laterality: Right;   PARS PLANA VITRECTOMY Left 03/31/2019   Procedure: PARS PLANA VITRECTOMY WITH 25 GAUGE WITH MEMBRANE PEEL;  Surgeon: [name], MD;  Location: [facility] OR;  Service: Ophthalmology;  Laterality: Left;   PHOTOCOAGULATION WITH LASER Right 02/24/2019   Procedure: Photocoagulation With Laser;  Surgeon: [name], MD;  Location: [facility] OR;  Service: Ophthalmology;  Laterality: Right;   PHOTOCOAGULATION WITH LASER Left 03/31/2019   Procedure: Photocoagulation With Laser;  Surgeon: [name], MD;  Location: [facility] OR;  Service: Ophthalmology;  Laterality: Left;   RETINAL DETACHMENT SURGERY Left  03/31/2019   TRD Repair - Dr. [name]   SILICON OIL REMOVAL Right 03/31/2019   Procedure: Silicon Oil Removal;  Surgeon: [name], MD;  Location: [facility] OR;  Service: Ophthalmology;  Laterality: Right;   STERNAL WOUND DEBRIDEMENT Left 08/27/2018   Procedure: Incision and DEBRIDEMENT Left Chest, Neck  and Mediastinum;  Surgeon: [name], MD;  Location: [facility] OR;  Service: Thoracic;  Laterality: Left;   STERNAL WOUND DEBRIDEMENT Left 08/30/2018   Procedure: WOUND VAC CHANGE, LEFT CHEST AND NECK, POSSIBLE DEBRIDEMENT;  Surgeon: [name], MD;  Location: [facility] OR;  Service: Thoracic;  Laterality: Left;   Family History Family History  Problem Relation Age of Onset   Hypertension Mother    Colon cancer Neg Hx     Social History Social History   Tobacco Use   Smoking status: Never   Smokeless tobacco: Never  Vaping Use   Vaping status: Never Used  Substance Use Topics   Alcohol use: No   Drug use: No   Allergies Tramadol  Review of Systems Review of Systems  Constitutional:  Positive for chills, fatigue and fever.    Physical Exam Vital Signs  I have reviewed the triage vital signs BP (!) 183/98   Pulse (!) 107   Temp 99.5 F (37.5 C) (Oral)   Resp 20   Ht 5\' 11"  (1.803 m)   Wt 90 kg   SpO2 92%   BMI 27.67 kg/m   Physical Exam Constitutional:      General: He is not in acute distress.    Appearance: Normal appearance.  HENT:     Head: Normocephalic and atraumatic.     Nose: No congestion or rhinorrhea.  Eyes:     General:        Right eye: No discharge.        Left eye: No discharge.     Extraocular Movements: Extraocular movements intact.     Pupils: Pupils are equal, round, and reactive to light.  Cardiovascular:     Rate and Rhythm: Normal rate and regular rhythm.     Heart sounds: No murmur heard. Pulmonary:     Effort: No respiratory distress.     Breath sounds: No wheezing or rales.  Abdominal:     General: There is no distension.      Tenderness: There is no abdominal tenderness.  Musculoskeletal:        General: Normal range of motion.     Cervical back: Normal range of motion.  Skin:    General: Skin is warm and dry.  Neurological:     General: No focal deficit present.     Mental Status: He is alert.     ED Results and Treatments Labs (all labs ordered are listed, but only abnormal results are displayed) Labs Reviewed  SARS CORONAVIRUS 2 BY RT PCR - Abnormal; Notable for the following components:      Result Value   SARS Coronavirus 2 by RT PCR POSITIVE (*)    All other components within normal limits  CBC WITH DIFFERENTIAL/PLATELET - Abnormal; Notable for the following components:   Hemoglobin 12.9 (*)    RDW 15.8 (*)    All other components within normal limits  COMPREHENSIVE METABOLIC PANEL - Abnormal; Notable for the following components:   Sodium 131 (*)    Chloride 89 (*)    Glucose, Bld 281 (*)    BUN 73 (*)    Creatinine, Ser 12.31 (*)    Calcium 8.7 (*)    Total Protein 8.6 (*)    GFR, Estimated 4 (*)    Anion gap 18 (*)    All other components within normal limits  Radiology No results found.  Pertinent labs & imaging results that were available during my care of the patient were reviewed by me and considered in my medical decision making (see MDM for details).  Medications Ordered in ED Medications  lactated ringers bolus 500 mL (0 mLs Intravenous Stopped 04/19/23 1504)                                                                                                                                     Procedures Procedures  (including critical care time)  Medical Decision Making / ED Course   This patient presents to the ED for concern of fever, myalgias, this involves an extensive number of treatment options, and is a complaint that carries with it a high risk of  complications and morbidity.  The differential diagnosis includes COVID-19, unspecified viral illness, bacteremia, electrolyte abnormality, thyroid abnormality, uremia  MDM: Patient seen Emergency Department for evaluation of multiple complaints as described above.  Physical exam is unchanged from patient's normal baseline.  Laboratory evaluation with sodium 131, chloride 89, BUN 73, creatinine 12.31 consistent with his history of ESRD on hemodialysis.  No significant leukocytosis.  Hemoglobin 12.9.  Patient is COVID-positive and this likely explains his symptoms.  Patient has been symptomatic for multiple days and with new strains of COVID-19 showing minimal improvement with antiviral therapy, we will defer Paxlovid treatment at this time.  Patient is not hypoxic.  Currently does not meet SIRS criteria and I have low suspicion for underlying bacteremia.  He is mildly tachycardic likely secondary to his COVID-19 infection.  At this time patient does not meet inpatient criteria for admission and is safe for discharge with outpatient follow-up.   Additional history obtained: -Additional history obtained from wife -External records from outside source obtained and reviewed including: Chart review including previous notes, labs, imaging, consultation notes   Lab Tests: -I ordered, reviewed, and interpreted labs.   The pertinent results include:   Labs Reviewed  SARS CORONAVIRUS 2 BY RT PCR - Abnormal; Notable for the following components:      Result Value   SARS Coronavirus 2 by RT PCR POSITIVE (*)    All other components within normal limits  CBC WITH DIFFERENTIAL/PLATELET - Abnormal; Notable for the following components:   Hemoglobin 12.9 (*)    RDW 15.8 (*)    All other components within normal limits  COMPREHENSIVE METABOLIC PANEL - Abnormal; Notable for the following components:   Sodium 131 (*)    Chloride 89 (*)    Glucose, Bld 281 (*)    BUN 73 (*)    Creatinine, Ser 12.31 (*)     Calcium 8.7 (*)    Total Protein 8.6 (*)    GFR, Estimated 4 (*)    Anion gap 18 (*)    All other components within normal limits      Medicines ordered and prescription drug management: Meds ordered  this encounter  Medications   lactated ringers bolus 500 mL    -I have reviewed the patients home medicines and have made adjustments as needed  Critical interventions none    Cardiac Monitoring: The patient was maintained on a cardiac monitor.  I personally viewed and interpreted the cardiac monitored which showed an underlying rhythm of: NSR  Social Determinants of Health:  Factors impacting patients care include: none   Reevaluation: After the interventions noted above, I reevaluated the patient and found that they have :stayed the same  Co morbidities that complicate the patient evaluation  Past Medical History:  Diagnosis Date   Anemia    Cataract 07/02/2020   Diabetes mellitus    Type 2    Diabetic retinopathy of both eyes (HCC) 01/31/2019   [name] 01/2019   ESRD (end stage renal disease) (HCC)    [place] Frenisus   Herpes simplex virus (HSV) infection    OU   Hypertension    Hypertensive retinopathy    OU   Retinal detachment    OS   Sepsis due to Streptococcus, group B (HCC) 10/05/2018      Dispostion: I considered admission for this patient, but at this time he does not meet inpatient criteria for admission and he is safe for discharge with outpatient follow-up.     Final Clinical Impression(s) / ED Diagnoses Final diagnoses:  COVID     @PCDICTATION @    Glendora Score, MD 04/19/23 1719

## 2023-04-19 NOTE — ED Triage Notes (Signed)
Pt comes in from being seen at the UC this morning for cough, chills, fatigue, and decreased urine output. Pt states that he has been having episodes the last 3 dialysis treatments feeling normally all week and than Friday feeling chills. Pt states he had a COVID test a week ago that was negative and has not been exposed to anyone sick he is aware of. Pt states this occurrence starting on Friday. Pt denies CP or sore throat.

## 2023-04-19 NOTE — ED Notes (Signed)
Patient is being discharged from the Urgent Care and sent to the Emergency Department via POV. Per PA, patient is in need of higher level of care due to increase risk of SIRS criteria. Patient is aware and verbalizes understanding of plan of care.  Vitals:   04/19/23 1037  BP: (!) 177/95  Pulse: (!) 104  Resp: 20  Temp: 100 F (37.8 C)  SpO2: 96%

## 2023-04-19 NOTE — ED Triage Notes (Addendum)
Pt reports cough, chills, abd "fullness", fatigue,decreased urine output,  nausea, generalized weakness, intermittent unsteady gait for last several weeks. Pt reports is dialysis patient and reports increase in severity on treatment days. Last void 215 this am. Last tylenol dose this am. Dialysis fistula in left upper extremity.   Pt alert and oriented. Denies shortness of breath.

## 2023-04-19 NOTE — Discharge Instructions (Signed)
I would like you to go to the emergency room to get checked out because I am concerned that you are developing a severe infection.  Please go directly there for further evaluation and management.

## 2023-04-19 NOTE — ED Notes (Signed)
Pt reports last meal was on Friday. Last drink with sugar in it this am.   Reports last weigh in at dialysis was 200 lbs.

## 2023-04-23 ENCOUNTER — Telehealth: Payer: Self-pay | Admitting: *Deleted

## 2023-04-23 ENCOUNTER — Encounter: Payer: Self-pay | Admitting: *Deleted

## 2023-04-23 ENCOUNTER — Ambulatory Visit: Payer: Self-pay | Admitting: *Deleted

## 2023-04-23 NOTE — Patient Outreach (Signed)
  Care Coordination   04/23/2023 Name: Randy Oneal MRN: [id number] DOB: [birth year]   Care Coordination Outreach Attempts:  An unsuccessful telephone outreach was attempted for a scheduled appointment today.  Follow Up Plan:  Additional outreach attempts will be made to offer the patient care coordination information and services.   Encounter Outcome:  No Answer. Left HIPAA compliant VM.   Care Coordination Interventions:  No, not indicated. Staff message sent to scheduling care guide for outreach and rescheduling.    Demetrios Loll, RN, BSN Care Management Coordinator [facility]  [facility] Direct Dial: [phone] Main #: [phone]

## 2023-04-23 NOTE — Patient Outreach (Signed)
Care Coordination   Follow Up Visit Note   04/23/2023 Name: Alekzander Karges III MRN: [id number] DOB: [birth year]  Alanda Slim III is a 57 y.o. year old male who sees Luking, Jonna Coup, MD for primary care. I spoke with  Alanda Slim III by phone today.  What matters to the patients health and wellness today?  Managing blood sugar and recovering from Covid. Diagnosed at ED visit on 04/19/23. He is feeling much better and was able to resume dialysis this week. Denies any cough, fever, chills, or other symptoms at this time.   Goals Addressed             This Visit's Progress    Over the next 6 months, patient will have an A1C of less than 8%   On track    Care Coordination Goals: Patient will follow-up endocrinologist every 6 months as recommended A1C was 8.0% on 01/13/2023 Patient will keep appointment with PCP on 04/28/23 Patient will continue to monitor and record blood sugar 3 times per day and as needed with Dexcom 7, and will call endocrinologist with any readings outside of recommended range Patient will take Dexcom to provider visits for blood sugar review Patient will refer to handout for Sick Day Rules to manage blood sugar when sick Patient will reach out to RN Care Coordinator [phone] with any care coordination or resource needs        SDOH assessments and interventions completed:  Yes SDOH Interventions Today    Flowsheet Row Most Recent Value  SDOH Interventions   Transportation Interventions Intervention Not Indicated  Financial Strain Interventions Intervention Not Indicated        Care Coordination Interventions:  Yes, provided  Interventions Today    Flowsheet Row Most Recent Value  Chronic Disease   Chronic disease during today's visit Diabetes, Chronic Kidney Disease/End Stage Renal Disease (ESRD)  General Interventions   General Interventions Discussed/Reviewed General Interventions Discussed, General Interventions Reviewed, Labs, Doctor Visits, Durable  Medical Equipment (DME), Sick Day Rules  Labs Hgb A1c every 6 months  Doctor Visits Discussed/Reviewed Doctor Visits Discussed, Doctor Visits Reviewed, PCP, Specialist, Annual Wellness Visits  [Annual Wellness visit scheduled for 06/05/23]  Durable Medical Equipment (DME) Glucomoter  [Dexcom continuous glucose monitor. Blood sugar has been controlled despite being sick with Covid. Glucose was 203 on 04/19/23 while at the hospital for evaluation]  PCP/Specialist Visits Compliance with follow-up visit  [PCP on 04/28/23 and endcorinologist on 07/21/23]  Exercise Interventions   Exercise Discussed/Reviewed Exercise Discussed, Exercise Reviewed  Education Interventions   Education Provided Provided Education, Provided Printed Education  [Printed education on Diabetes Mellitus and Sick Day Management]  Provided Verbal Education On Blood Sugar Monitoring, Nutrition, Labs, When to see the doctor, Medication, Exercise, Sick Day Rules  Labs Reviewed Hgb A1c  [01/13/23 A1C 8.0%. Will repeat at visit with endocrinologist in December. PCP may repeat at visit on 04/28/23 since it will have been over 3 months since last checked.]  Mental Health Interventions   Mental Health Discussed/Reviewed Mental Health Discussed, Mental Health Reviewed  [No mental health concerns]  Nutrition Interventions   Nutrition Discussed/Reviewed --  [3 meals per day with 30 GM of CHO and up to 2 snacks per day, as neeed with less than 15 GM of CHO]  Pharmacy Interventions   Pharmacy Dicussed/Reviewed Pharmacy Topics Discussed, Pharmacy Topics Reviewed, Medications and their functions  Safety Interventions   Safety Discussed/Reviewed Safety Discussed, Safety Reviewed       Follow up  plan: Follow up call scheduled for 05/28/23    Encounter Outcome:  Patient Visit Completed   Demetrios Loll, RN, BSN Care Management Coordinator [facility]  [facility] Direct Dial: [phone] Main #: [phone]

## 2023-04-28 ENCOUNTER — Ambulatory Visit: Payer: Medicare Other | Admitting: Family Medicine

## 2023-05-26 ENCOUNTER — Ambulatory Visit (INDEPENDENT_AMBULATORY_CARE_PROVIDER_SITE_OTHER): Payer: Medicare Other | Admitting: Ophthalmology

## 2023-05-26 ENCOUNTER — Encounter (INDEPENDENT_AMBULATORY_CARE_PROVIDER_SITE_OTHER): Payer: Self-pay | Admitting: Ophthalmology

## 2023-05-26 ENCOUNTER — Encounter (INDEPENDENT_AMBULATORY_CARE_PROVIDER_SITE_OTHER): Payer: Medicare Other | Admitting: Ophthalmology

## 2023-05-26 DIAGNOSIS — H4313 Vitreous hemorrhage, bilateral: Secondary | ICD-10-CM

## 2023-05-26 DIAGNOSIS — H35033 Hypertensive retinopathy, bilateral: Secondary | ICD-10-CM

## 2023-05-26 DIAGNOSIS — H3343 Traction detachment of retina, bilateral: Secondary | ICD-10-CM

## 2023-05-26 DIAGNOSIS — E113513 Type 2 diabetes mellitus with proliferative diabetic retinopathy with macular edema, bilateral: Secondary | ICD-10-CM

## 2023-05-26 DIAGNOSIS — Z7985 Long-term (current) use of injectable non-insulin antidiabetic drugs: Secondary | ICD-10-CM

## 2023-05-26 DIAGNOSIS — I10 Essential (primary) hypertension: Secondary | ICD-10-CM

## 2023-05-26 DIAGNOSIS — Z7984 Long term (current) use of oral hypoglycemic drugs: Secondary | ICD-10-CM | POA: Diagnosis not present

## 2023-05-26 DIAGNOSIS — Z961 Presence of intraocular lens: Secondary | ICD-10-CM

## 2023-05-26 MED ORDER — BROMFENAC SODIUM 0.07 % OP SOLN: 1.0000 [drp] | Freq: Four times a day (QID) | OPHTHALMIC | 10 refills | Status: DC

## 2023-05-26 MED ORDER — AFLIBERCEPT 2MG/0.05ML IZ SOLN FOR KALEIDOSCOPE
2.0000 mg | INTRAVITREAL | Status: AC | PRN
Start: 2023-05-26 — End: 2023-05-26
  Administered 2023-05-26: 2 mg via INTRAVITREAL

## 2023-05-26 MED ORDER — FARICIMAB-SVOA 6 MG/0.05ML IZ SOSY
6.0000 mg | PREFILLED_SYRINGE | INTRAVITREAL | Status: AC | PRN
Start: 2023-05-26 — End: 2023-05-26
  Administered 2023-05-26: 6 mg via INTRAVITREAL

## 2023-05-26 MED ORDER — BROMFENAC SODIUM 0.07 % OP SOLN: 1.0000 [drp] | Freq: Four times a day (QID) | OPHTHALMIC | 10 refills | Status: AC

## 2023-05-26 NOTE — Progress Notes (Signed)
[facility] - Clinic Note  05/26/2023    CHIEF COMPLAINT Patient presents for Retina Follow Up  HISTORY OF PRESENT ILLNESS: Randy Oneal is a 57 y.o. male who presents to the clinic today for:  HPI     Retina Follow Up   Patient presents with  Diabetic Retinopathy.  In both eyes.  This started months ago.  Duration of 8 weeks.  I, the attending physician,  performed the HPI with the patient and updated documentation appropriately.        Comments   Patient state the vision is the same. He feels like the vision in both eyes is like looking through water. He is using Prolensa OU QID and Pred OU QID. His blood sugar was 165.      Last edited by [name], MD on 05/26/2023  8:55 AM.    Pt recently had covid, he states his blood sugars went up while he was sick  Referring physician: [name], MD [address] [place],  [place] [address]  HISTORICAL INFORMATION:   Selected notes from the MEDICAL RECORD NUMBER Referred by Dr.[name] for concern of decreased vision post cataract sx   CURRENT MEDICATIONS: Current Outpatient Medications (Ophthalmic Drugs)  Medication Sig   prednisoLONE acetate (PRED FORTE) 1 % ophthalmic suspension PLACE (1) DROP INTO BOTH EYES FOUR TIMES DAILY   Bromfenac Sodium (PROLENSA) 0.07 % SOLN Place 1 drop into both eyes 4 (four) times daily.   No current facility-administered medications for this visit. (Ophthalmic Drugs)   Current Outpatient Medications (Other)  Medication Sig   acetaminophen (TYLENOL) 500 MG tablet Take 1,000 mg by mouth every 6 (six) hours as needed for moderate pain or headache.   amLODipine (NORVASC) 2.5 MG tablet Take 1 tablet (2.5 mg total) by mouth daily.   atorvastatin (LIPITOR) 10 MG tablet Take 1 tablet by mouth daily.   blood glucose meter kit and supplies Use to check sugars 3 times daily   Cholecalciferol 1.25 MG (50000 UT) capsule Take by mouth.   cinacalcet (SENSIPAR)  30 MG tablet Take 30 mg by mouth daily with supper.   Continuous Blood Gluc Sensor (DEXCOM G7 SENSOR) MISC 1 Device by Does not apply route as directed.   glucose blood (ONETOUCH ULTRA) test strip 1 each by Other route in the morning, at noon, in the evening, and at bedtime. Use as instructed   insulin aspart (NOVOLOG FLEXPEN) 100 UNIT/ML FlexPen 7 units with breakfast and lunch, and 8 units with supper CF NovoLog(BG -130/40)  Max daily 45 units   insulin degludec (TRESIBA FLEXTOUCH) 100 UNIT/ML FlexTouch Pen Inject 22 Units into the skin at bedtime.   Insulin Pen Needle 32G X 4 MM MISC 1 Device by Does not apply route in the morning, at noon, in the evening, and at bedtime.   lidocaine-prilocaine (EMLA) cream Apply 1 application topically Every Tuesday,Thursday,and Saturday with dialysis.   MICROLET LANCETS MISC USE FOUR TIMES A DAY AS DIRECTED.   nystatin (MYCOSTATIN/NYSTOP) powder Apply topically 2 (two) times daily. To groin and scrotum   sildenafil (VIAGRA) 50 MG tablet Take by mouth.   sucroferric oxyhydroxide (VELPHORO) 500 MG chewable tablet Chew 1,000 mg by mouth 3 (three) times daily with meals.   No current facility-administered medications for this visit. (Other)   REVIEW OF SYSTEMS: ROS   Positive for: Genitourinary, Endocrine, Eyes Negative for: Constitutional, Gastrointestinal, Neurological, Skin, Musculoskeletal, HENT, Cardiovascular, Respiratory, Psychiatric, Allergic/Imm, Heme/Lymph Last edited  by Charlette Caffey, COT on 05/26/2023  7:57 AM.      ALLERGIES Allergies  Allergen Reactions   Tramadol Nausea And Vomiting   PAST MEDICAL HISTORY Past Medical History:  Diagnosis Date   Anemia    Cataract 07/02/2020   Diabetes mellitus    Type 2    Diabetic retinopathy of both eyes (HCC) 01/31/2019   [name] 01/2019   ESRD (end stage renal disease) (HCC)    [place] Frenisus   Herpes simplex virus (HSV) infection    OU   Hypertension    Hypertensive retinopathy     OU   Retinal detachment    OS   Sepsis due to Streptococcus, group B (HCC) 10/05/2018   Past Surgical History:  Procedure Laterality Date   25 GAUGE PARS PLANA VITRECTOMY WITH 20 GAUGE MVR PORT FOR MACULAR HOLE Right 02/24/2019   Procedure: 25 GAUGE PARS PLANA VITRECTOMY WITH 20 GAUGE MVR PORT FOR MACULAR HOLE;  Surgeon: [name], MD;  Location: [facility] OR;  Service: Ophthalmology;  Laterality: Right;   APPLICATION OF WOUND VAC Left 08/27/2018   Procedure: APPLICATION OF WOUND VAC, left neck;  Surgeon: [name], MD;  Location: [facility] OR;  Service: Thoracic;  Laterality: Left;   APPLICATION OF WOUND VAC Left 08/30/2018   Procedure: APPLICATION OF WOUND VAC;  Surgeon: [name], MD;  Location: [facility] OR;  Service: Thoracic;  Laterality: Left;   AV FISTULA PLACEMENT Left 02/15/2020   Procedure: LEFT ARM ARTERIOVENOUS (AV) FISTULA CREATION;  Surgeon: [name], MD;  Location: [facility] OR;  Service: Vascular;  Laterality: Left;   CATARACT EXTRACTION Bilateral    CATARACT EXTRACTION W/ INTRAOCULAR LENS  IMPLANT, BILATERAL     COLONOSCOPY  06/24/2011   Procedure: COLONOSCOPY;  Surgeon: [name], MD;  Location: [facility] SUITE;  Service: Endoscopy;  Laterality: N/A;  8:30 AM   EYE SURGERY     GAS/FLUID EXCHANGE Left 03/31/2019   Procedure: Gas/Fluid Exchange;  Surgeon: [name], MD;  Location: [facility] OR;  Service: Ophthalmology;  Laterality: Left;   INJECTION OF SILICONE OIL  02/24/2019   Procedure: Injection Of Silicone Oil;  Surgeon: [name], MD;  Location: [facility] OR;  Service: Ophthalmology;;   INSERTION OF DIALYSIS CATHETER Right 12/05/2019   Procedure: INSERTION OF DIALYSIS CATHETER RIGHT SUBCLAVIAN;  Surgeon: [name], MD;  Location: [facility] ORS;  Service: General;  Laterality: Right;   INSERTION OF DIALYSIS CATHETER Right 12/07/2019   Procedure: Minor Dialysis catheter in place- need to reposition/ adjust catheter to help with flows;  Surgeon: [name], MD;   Location: [facility] ORS;  Service: General;  Laterality: Right;  procedure room case with 1% lidocaine, full sterile drape,  towels, full gown, large chloraprep, 4-0 Monocryl and dermabond    INSERTION OF DIALYSIS CATHETER Right 12/08/2019   Procedure: INSERTION OF DIALYSIS CATHETER EXCHANGE;  Surgeon: [name], MD;  Location: [facility] ORS;  Service: General;  Laterality: Right;   IR ANGIOGRAM EXTREMITY RIGHT  03/15/2021   IR RADIOLOGIST EVAL & MGMT  02/07/2021   IR RADIOLOGIST EVAL & MGMT  05/09/2021   IR US GUIDE VASC ACCESS RIGHT  03/15/2021   MEMBRANE PEEL Right 02/24/2019   Procedure: [place] Flax;  Surgeon: [name], MD;  Location: [facility] OR;  Service: Ophthalmology;  Laterality: Right;   PARS PLANA VITRECTOMY Left 03/31/2019   Procedure: PARS PLANA VITRECTOMY WITH 25 GAUGE WITH MEMBRANE PEEL;  Surgeon: [name], MD;  Location: [facility]  OR;  Service: Ophthalmology;  Laterality: Left;   PHOTOCOAGULATION WITH LASER Right 02/24/2019   Procedure: Photocoagulation With Laser;  Surgeon: [name], MD;  Location: [facility] OR;  Service: Ophthalmology;  Laterality: Right;   PHOTOCOAGULATION WITH LASER Left 03/31/2019   Procedure: Photocoagulation With Laser;  Surgeon: [name], MD;  Location: [facility] OR;  Service: Ophthalmology;  Laterality: Left;   RETINAL DETACHMENT SURGERY Left 03/31/2019   TRD Repair - Dr. [name]   SILICON OIL REMOVAL Right 03/31/2019   Procedure: Silicon Oil Removal;  Surgeon: [name], MD;  Location: [facility] OR;  Service: Ophthalmology;  Laterality: Right;   STERNAL WOUND DEBRIDEMENT Left 08/27/2018   Procedure: Incision and DEBRIDEMENT Left Chest, Neck and Mediastinum;  Surgeon: [name], MD;  Location: [facility] OR;  Service: Thoracic;  Laterality: Left;   STERNAL WOUND DEBRIDEMENT Left 08/30/2018   Procedure: WOUND VAC CHANGE, LEFT CHEST AND NECK, POSSIBLE DEBRIDEMENT;  Surgeon: [name], MD;  Location: [facility] OR;  Service: Thoracic;  Laterality: Left;   FAMILY HISTORY Family  History  Problem Relation Age of Onset   Hypertension Mother    Colon cancer Neg Hx    SOCIAL HISTORY Social History   Tobacco Use   Smoking status: Never   Smokeless tobacco: Never  Vaping Use   Vaping status: Never Used  Substance Use Topics   Alcohol use: No   Drug use: No       OPHTHALMIC EXAM:  Base Eye Exam     Visual Acuity (Snellen - Linear)       Right Left   Dist Ignacio 20/200 20/200   Dist ph  20/150 20/150         Tonometry (Tonopen, 8:04 AM)       Right Left   Pressure 15 12         Pupils       Dark Light Shape React APD   Right 4 3 Irregular Brisk None   Left 4 3 Irregular Brisk None         Visual Fields       Left Right   Restrictions Partial outer superior temporal, inferior temporal, inferior nasal deficiencies Partial outer superior temporal, inferior temporal deficiencies         Extraocular Movement       Right Left    Full, Ortho Full, Ortho         Neuro/Psych     Oriented x3: Yes   Mood/Affect: Normal         Dilation     Both eyes: 1.0% Mydriacyl, 2.5% Phenylephrine @ 7:58 AM           Slit Lamp and Fundus Exam     Slit Lamp Exam       Right Left   Lids/Lashes mild Meibomian gland dysfunction mild Meibomian gland dysfunction   Conjunctiva/Sclera subconj silicon oil bubbles, Chemosis, conj Cyst White and quiet   Cornea Trace PEE, Well healed cataract wound, arcus, Debris in tear film Trace PEE, Well healed cataract wound, arcus, Debris in tear film   Anterior Chamber Deep and quiet, no cell or flare Deep and quiet, no cell or flare   Iris slightly irregular dilation, posterior synchiae from 3:00-1200 round, focal Posterior synechiae at 1030   Lens PC IOL in good position, pigment deposition, 1+ PCO, mild pigment deposition on optic PC IOL in good position, mild pigment deposition   Anterior Vitreous Post vit, silicon oil bubble ~85% post vitrectomy,  good silicone oil fill         Fundus Exam        Right Left   Disc 2+pallor, sharp rim, mild fibrosis along SN rim +pallor, sharp rim, fine NVD--regressed, mild fibrosis   C/D Ratio 0.2 0.2   Macula attached under oil, scattered MA/IRH -- improved, nasal and central thickening / edema -- slightly increased attached under oil, persistent fibrosis, persistent edema -- slightly increased, focal bands of PRF with traction emanating from ST macula -- stable, focal DBH temporal macula -- improved   Vessels attenuated, Tortuous Attenuated, dilated venules, Tortuous   Periphery Attached, dense 360 PRP laser, mild scattered fibrosis - persistent, Focal ret hole along distal IT arcades--good laser surrounding Attached, good 360 PRP laser changes, pre-retinal fibrosis extending to posterior PRP border superior and inferiorly           IMAGING AND PROCEDURES  Imaging and Procedures for @TODAY @  OCT, Retina - OU - Both Eyes       Right Eye Quality was good. Central Foveal Thickness: 694. Progression has worsened. Findings include no SRF, abnormal foveal contour, epiretinal membrane, intraretinal fluid, outer retinal atrophy, preretinal fibrosis (Mild interval increase in central cystic changes).   Left Eye Quality was good. Central Foveal Thickness: 603. Progression has worsened. Findings include no SRF, abnormal foveal contour, intraretinal hyper-reflective material, epiretinal membrane, intraretinal fluid, preretinal fibrosis (Interval increase in central cystic changes).   Notes *Images captured and stored on drive  Diagnosis / Impression:  +DME under silicon oil OU OD: Mild interval increase in central cystic changes OS: Interval increase in central cystic changes  Clinical management:  See below  Abbreviations: NFP - Normal foveal profile. CME - cystoid macular edema. PED - pigment epithelial detachment. IRF - intraretinal fluid. SRF - subretinal fluid. EZ - ellipsoid zone. ERM - epiretinal membrane. ORA - outer retinal atrophy. ORT -  outer retinal tubulation. SRHM - subretinal hyper-reflective material      Intravitreal Injection, Pharmacologic Agent - OD - Right Eye       Time Out 05/26/2023. 8:09 AM. Confirmed correct patient, procedure, site, and patient consented.   Anesthesia Topical anesthesia was used. Anesthetic medications included Lidocaine 2%, Proparacaine 0.5%.   Procedure Preparation included 5% betadine to ocular surface, eyelid speculum. A (32g) needle was used.   Injection: 2 mg aflibercept 2 MG/0.05ML   Route: Intravitreal, Site: Right Eye   NDC: L6038910, Lot: [id number], Expiration date: 07/10/2024, Waste: 0 mL   Post-op Post injection exam found visual acuity of at least counting fingers. The patient tolerated the procedure well. There were no complications. The patient received written and verbal post procedure care education. Post injection medications were not given.      Intravitreal Injection, Pharmacologic Agent - OS - Left Eye       Time Out 05/26/2023. 8:10 AM. Confirmed correct patient, procedure, site, and patient consented.   Anesthesia Topical anesthesia was used. Anesthetic medications included Lidocaine 2%, Proparacaine 0.5%.   Procedure Preparation included 5% betadine to ocular surface, eyelid speculum. A (32g) needle was used.   Injection: 6 mg faricimab-svoa 6 MG/0.05ML   Route: Intravitreal, Site: Left Eye   NDC: 09811-914-78, Lot: [id number], Expiration date: 05/10/2024, Waste: 0 mL   Post-op Post injection exam found visual acuity of at least counting fingers. The patient tolerated the procedure well. There were no complications. The patient received written and verbal post procedure care education. Post injection medications were not given.  ASSESSMENT/PLAN:    ICD-10-CM   1. Proliferative diabetic retinopathy of both eyes with macular edema associated with type 2 diabetes mellitus (HCC)  E11.3513 OCT, Retina - OU - Both Eyes     Intravitreal Injection, Pharmacologic Agent - OD - Right Eye    Intravitreal Injection, Pharmacologic Agent - OS - Left Eye    faricimab-svoa (VABYSMO) 6mg /0.44mL intravitreal injection    aflibercept (EYLEA) SOLN 2 mg    CANCELED: OCT, Retina - OU - Both Eyes    2. Long term (current) use of oral hypoglycemic drugs  Z79.84     3. Long-term (current) use of injectable non-insulin antidiabetic drugs  Z79.85     4. 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 Vitreous hemorrhage of both eyes (HCC)  H43.13     6. Essential hypertension  I10     7. Hypertensive retinopathy of both eyes  H35.033     8. Pseudophakia of both eyes  Z96.1      1-5. Proliferative diabetic retinopathy with DME, TRD, and vitreous hemorrhage OU (OD > OS)  - last A1c: 8.0 on 06.04.24, 9.6 on 11.11.22  - lost to f/u from Jan 2021 to Oct 2021 due to loss of insurance coverage  - pt with complex medical history with hospitalization in Jan-Feb 2020 for bacteremia and abscess  - history of poor glycemic control for years  - s/p IVA OD #1 (06.20.20), #2 (07.01.20), #3 (09.11.20), #4 (10.09.20), #5 (11.06.20), #6 (11.24.21), #7 (12.22.21), #8 (01.19.22)  - s/p IVA OS #1 (06.20.20), #2 (07.01.20), #3 (08.13.20), #4 (09.11.20)  #5 (11.06.20), #6 (11.24.21), #7 (12.22.21), #8 (01.19.22) - s/p IVE OU #1 (12.04.20) - sample; #2 (01.06.21), #3 (02.18.22), #4 (03.18.22), #5 (04.18.22), #6 (05.25.22), #7 (07.01.22), #8 (07.29.22), #9 (09.02.22), #10 (09.30.22), #11 (11.04.22), #12 (12.09.23), #13 (01.13.23), #14 (03.02.23), #15 (04.11.23), #16 (06.01.23), #17 (07.11.23), #18 (09.12.23), #19 (11.14.23) - s/p IVE OD #20 (03.12.24), #21 (04.23.24), #22 (06.04.24), #23 (07.16.24), #24 (08.30.24) - s/p IVV OS #1 (03.12.24), #2 (04.23.24), #3 (06.04.24), #4 (07.16.24), #5 (08.30.24)  - s/p STK OS #1 (10.09.20)  - s/p PRP OS (06.10.20)  - s/p PPV/PFC/EL/FAX/silicone oil OD, 07.16.20  - s/p subconj silicon oil removal OD  8.20.20  - s/p PPV/EL/FAX/silicone oil OS, 08.20.20  - BCVA OD: 20/150 OU -- stable             - OCT shows OD: Mild interval increase in central cystic changes; OS: Interval increase in central cystic changes at 6 wks  - recommend IVE OD #25 and IVV OS #6 today, 10.15.24 with f/u in 6 weeks  - RBA of procedure discussed, questions answered  - IVV informed consent obtained and signed, 03.12.23 (OU)  - see procedure note             - [employer] benefits investigation begun 01.19.22 -- approved for 2024  - cont topical PF and Prolensa QID OU for possible CME component  - f/u 6 weeks -- DFE/OCT, possible injection(s)  6,7. Hypertensive retinopathy OU  - discussed importance of tight BP control  - continue to monitor  8. Pseudophakia OU  - s/p CE with IOL (Dr. Nile Riggs)  Ophthalmic Meds Ordered this visit:  Meds ordered this encounter  Medications   DISCONTD: Bromfenac Sodium (PROLENSA) 0.07 % SOLN    Sig: Place 1 drop into both eyes 4 (four) times daily.    Dispense:  3 mL    Refill:  10   Bromfenac  Sodium (PROLENSA) 0.07 % SOLN    Sig: Place 1 drop into both eyes 4 (four) times daily.    Dispense:  3 mL    Refill:  10   faricimab-svoa (VABYSMO) 6mg /0.60mL intravitreal injection   aflibercept (EYLEA) SOLN 2 mg     Return in about 6 weeks (around 07/07/2023) for f/u PDR OU, DFE, OCT.  There are no Patient Instructions on file for this visit.  This document serves as a record of services personally performed by Karie Chimera, MD, PhD. It was created on their behalf by Glee Arvin. Manson Passey, OA an ophthalmic technician. The creation of this record is the provider's dictation and/or activities during the visit.    Electronically signed by: Glee Arvin. Manson Passey, OA 05/26/23 9:01 PM  Karie Chimera, M.D., Ph.D. Diseases & Surgery of the Retina and Vitreous [facility]  I have reviewed the above documentation for accuracy and completeness, and I agree with the above.  Karie Chimera, M.D., Ph.D. 05/26/23 9:06 PM  Abbreviations: M myopia (nearsighted); A astigmatism; H hyperopia (farsighted); P presbyopia; Mrx spectacle prescription;  CTL contact lenses; OD right eye; OS left eye; OU both eyes  XT exotropia; ET esotropia; PEK punctate epithelial keratitis; PEE punctate epithelial erosions; DES dry eye syndrome; MGD meibomian gland dysfunction; ATs artificial tears; PFAT's preservative free artificial tears; NSC nuclear sclerotic cataract; PSC posterior subcapsular cataract; ERM epi-retinal membrane; PVD posterior vitreous detachment; RD retinal detachment; DM diabetes mellitus; DR diabetic retinopathy; NPDR non-proliferative diabetic retinopathy; PDR proliferative diabetic retinopathy; CSME clinically significant macular edema; DME diabetic macular edema; dbh dot blot hemorrhages; CWS cotton wool spot; POAG primary open angle glaucoma; C/D cup-to-disc ratio; HVF humphrey visual field; GVF goldmann visual field; OCT optical coherence tomography; IOP intraocular pressure; BRVO Branch retinal vein occlusion; CRVO central retinal vein occlusion; CRAO central retinal artery occlusion; BRAO branch retinal artery occlusion; RT retinal tear; SB scleral buckle; PPV pars plana vitrectomy; VH Vitreous hemorrhage; PRP panretinal laser photocoagulation; IVK intravitreal kenalog; VMT vitreomacular traction; MH Macular hole;  NVD neovascularization of the disc; NVE neovascularization elsewhere; AREDS age related eye disease study; ARMD age related macular degeneration; POAG primary open angle glaucoma; EBMD epithelial/anterior basement membrane dystrophy; ACIOL anterior chamber intraocular lens; IOL intraocular lens; PCIOL posterior chamber intraocular lens; Phaco/IOL phacoemulsification with intraocular lens placement; PRK photorefractive keratectomy; LASIK laser assisted in situ keratomileusis; HTN hypertension; DM diabetes mellitus; COPD chronic obstructive pulmonary disease

## 2023-05-26 NOTE — Progress Notes (Deleted)
[facility] - Clinic Note  05/26/2023    CHIEF COMPLAINT Patient presents for Retina Follow Up  HISTORY OF PRESENT ILLNESS: Randy Oneal is a 57 y.o. male who presents to the clinic today for:  HPI     Retina Follow Up   Patient presents with  Diabetic Retinopathy.  In both eyes.  This started months ago.  Duration of 8 weeks.  I, the attending physician,  performed the HPI with the patient and updated documentation appropriately.        Comments   Patient state the vision is the same. He feels like the vision in both eyes is like looking through water. He is using Prolensa OU QID and Pred OU QID. His blood sugar was 165.      Last edited by Charlette Caffey, COT on 05/26/2023  7:57 AM.      Pt feels like his vision is pretty good  Referring physician: [name], MD [address] [place],  [place] [address]  HISTORICAL INFORMATION:   Selected notes from the MEDICAL RECORD NUMBER Referred by Dr.[name] for concern of decreased vision post cataract sx   CURRENT MEDICATIONS: Current Outpatient Medications (Ophthalmic Drugs)  Medication Sig   Bromfenac Sodium (PROLENSA) 0.07 % SOLN Place 1 drop into both eyes 4 (four) times daily.   prednisoLONE acetate (PRED FORTE) 1 % ophthalmic suspension PLACE (1) DROP INTO BOTH EYES FOUR TIMES DAILY   No current facility-administered medications for this visit. (Ophthalmic Drugs)   Current Outpatient Medications (Other)  Medication Sig   acetaminophen (TYLENOL) 500 MG tablet Take 1,000 mg by mouth every 6 (six) hours as needed for moderate pain or headache.   amLODipine (NORVASC) 2.5 MG tablet Take 1 tablet (2.5 mg total) by mouth daily.   atorvastatin (LIPITOR) 10 MG tablet Take 1 tablet by mouth daily.   blood glucose meter kit and supplies Use to check sugars 3 times daily   Cholecalciferol 1.25 MG (50000 UT) capsule Take by mouth.   cinacalcet (SENSIPAR) 30 MG tablet Take 30 mg by  mouth daily with supper.   Continuous Blood Gluc Sensor (DEXCOM G7 SENSOR) MISC 1 Device by Does not apply route as directed.   glucose blood (ONETOUCH ULTRA) test strip 1 each by Other route in the morning, at noon, in the evening, and at bedtime. Use as instructed   insulin aspart (NOVOLOG FLEXPEN) 100 UNIT/ML FlexPen 7 units with breakfast and lunch, and 8 units with supper CF NovoLog(BG -130/40)  Max daily 45 units   insulin degludec (TRESIBA FLEXTOUCH) 100 UNIT/ML FlexTouch Pen Inject 22 Units into the skin at bedtime.   Insulin Pen Needle 32G X 4 MM MISC 1 Device by Does not apply route in the morning, at noon, in the evening, and at bedtime.   lidocaine-prilocaine (EMLA) cream Apply 1 application topically Every Tuesday,Thursday,and Saturday with dialysis.   MICROLET LANCETS MISC USE FOUR TIMES A DAY AS DIRECTED.   nystatin (MYCOSTATIN/NYSTOP) powder Apply topically 2 (two) times daily. To groin and scrotum   sildenafil (VIAGRA) 50 MG tablet Take by mouth.   sucroferric oxyhydroxide (VELPHORO) 500 MG chewable tablet Chew 1,000 mg by mouth 3 (three) times daily with meals.   No current facility-administered medications for this visit. (Other)   REVIEW OF SYSTEMS: ROS   Positive for: Genitourinary, Endocrine, Eyes Negative for: Constitutional, Gastrointestinal, Neurological, Skin, Musculoskeletal, HENT, Cardiovascular, Respiratory, Psychiatric, Allergic/Imm, Heme/Lymph Last edited by Charlette Caffey,  COT on 05/26/2023  7:57 AM.      ALLERGIES Allergies  Allergen Reactions   Tramadol Nausea And Vomiting   PAST MEDICAL HISTORY Past Medical History:  Diagnosis Date   Anemia    Cataract 07/02/2020   Diabetes mellitus    Type 2    Diabetic retinopathy of both eyes (HCC) 01/31/2019   [name] 01/2019   ESRD (end stage renal disease) (HCC)    [place] Frenisus   Herpes simplex virus (HSV) infection    OU   Hypertension    Hypertensive retinopathy    OU   Retinal  detachment    OS   Sepsis due to Streptococcus, group B (HCC) 10/05/2018   Past Surgical History:  Procedure Laterality Date   25 GAUGE PARS PLANA VITRECTOMY WITH 20 GAUGE MVR PORT FOR MACULAR HOLE Right 02/24/2019   Procedure: 25 GAUGE PARS PLANA VITRECTOMY WITH 20 GAUGE MVR PORT FOR MACULAR HOLE;  Surgeon: [name], MD;  Location: [facility] OR;  Service: Ophthalmology;  Laterality: Right;   APPLICATION OF WOUND VAC Left 08/27/2018   Procedure: APPLICATION OF WOUND VAC, left neck;  Surgeon: [name], MD;  Location: [facility] OR;  Service: Thoracic;  Laterality: Left;   APPLICATION OF WOUND VAC Left 08/30/2018   Procedure: APPLICATION OF WOUND VAC;  Surgeon: [name], MD;  Location: [facility] OR;  Service: Thoracic;  Laterality: Left;   AV FISTULA PLACEMENT Left 02/15/2020   Procedure: LEFT ARM ARTERIOVENOUS (AV) FISTULA CREATION;  Surgeon: [name], MD;  Location: [facility] OR;  Service: Vascular;  Laterality: Left;   CATARACT EXTRACTION Bilateral    CATARACT EXTRACTION W/ INTRAOCULAR LENS  IMPLANT, BILATERAL     COLONOSCOPY  06/24/2011   Procedure: COLONOSCOPY;  Surgeon: [name], MD;  Location: [facility] END[facility] SUITE;  Service: Endoscopy;  Laterality: N/A;  8:30 AM   EYE SURGERY     GAS/FLUID EXCHANGE Left 03/31/2019   Procedure: Gas/Fluid Exchange;  Surgeon: [name], MD;  Location: [facility] OR;  Service: Ophthalmology;  Laterality: Left;   INJECTION OF SILICONE OIL  02/24/2019   Procedure: Injection Of Silicone Oil;  Surgeon: [name], MD;  Location: [facility] OR;  Service: Ophthalmology;;   INSERTION OF DIALYSIS CATHETER Right 12/05/2019   Procedure: INSERTION OF DIALYSIS CATHETER RIGHT SUBCLAVIAN;  Surgeon: [name], MD;  Location: [facility] ORS;  Service: General;  Laterality: Right;   INSERTION OF DIALYSIS CATHETER Right 12/07/2019   Procedure: Minor Dialysis catheter in place- need to reposition/ adjust catheter to help with flows;  Surgeon: [name], MD;  Location: [facility] ORS;   Service: General;  Laterality: Right;  procedure room case with 1% lidocaine, full sterile drape,  towels, full gown, large chloraprep, 4-0 Monocryl and dermabond    INSERTION OF DIALYSIS CATHETER Right 12/08/2019   Procedure: INSERTION OF DIALYSIS CATHETER EXCHANGE;  Surgeon: [name], MD;  Location: [facility] ORS;  Service: General;  Laterality: Right;   IR ANGIOGRAM EXTREMITY RIGHT  03/15/2021   IR RADIOLOGIST EVAL & MGMT  02/07/2021   IR RADIOLOGIST EVAL & MGMT  05/09/2021   IR US GUIDE VASC ACCESS RIGHT  03/15/2021   MEMBRANE PEEL Right 02/24/2019   Procedure: [place] Flax;  Surgeon: [name], MD;  Location: [facility] OR;  Service: Ophthalmology;  Laterality: Right;   PARS PLANA VITRECTOMY Left 03/31/2019   Procedure: PARS PLANA VITRECTOMY WITH 25 GAUGE WITH MEMBRANE PEEL;  Surgeon: [name], MD;  Location: [facility] OR;  Service: Ophthalmology;  Laterality: Left;   PHOTOCOAGULATION WITH LASER Right 02/24/2019   Procedure: Photocoagulation With Laser;  Surgeon: [name], MD;  Location: [facility] OR;  Service: Ophthalmology;  Laterality: Right;   PHOTOCOAGULATION WITH LASER Left 03/31/2019   Procedure: Photocoagulation With Laser;  Surgeon: [name], MD;  Location: [facility] OR;  Service: Ophthalmology;  Laterality: Left;   RETINAL DETACHMENT SURGERY Left 03/31/2019   TRD Repair - Dr. [name]   SILICON OIL REMOVAL Right 03/31/2019   Procedure: Silicon Oil Removal;  Surgeon: [name], MD;  Location: [facility] OR;  Service: Ophthalmology;  Laterality: Right;   STERNAL WOUND DEBRIDEMENT Left 08/27/2018   Procedure: Incision and DEBRIDEMENT Left Chest, Neck and Mediastinum;  Surgeon: [name], MD;  Location: [facility] OR;  Service: Thoracic;  Laterality: Left;   STERNAL WOUND DEBRIDEMENT Left 08/30/2018   Procedure: WOUND VAC CHANGE, LEFT CHEST AND NECK, POSSIBLE DEBRIDEMENT;  Surgeon: [name], MD;  Location: [facility] OR;  Service: Thoracic;  Laterality: Left;   FAMILY HISTORY Family History  Problem  Relation Age of Onset   Hypertension Mother    Colon cancer Neg Hx    SOCIAL HISTORY Social History   Tobacco Use   Smoking status: Never   Smokeless tobacco: Never  Vaping Use   Vaping status: Never Used  Substance Use Topics   Alcohol use: No   Drug use: No       OPHTHALMIC EXAM:  Base Eye Exam     Visual Acuity     Snellen - Linear         Pupils       Dark Light Shape React APD   Right 4 3 Irregular Brisk None   Left 4 3 Irregular Brisk None         Visual Fields       Left Right   Restrictions Partial outer superior temporal, inferior temporal, inferior nasal deficiencies Partial outer superior temporal, inferior temporal deficiencies         Extraocular Movement       Right Left    Full, Ortho Full, Ortho         Neuro/Psych     Oriented x3: Yes         Dilation     Both eyes: 1.0% Mydriacyl, 2.5% Phenylephrine @ 7:58 AM           IMAGING AND PROCEDURES  Imaging and Procedures for @TODAY @          ASSESSMENT/PLAN:    ICD-10-CM   1. Proliferative diabetic retinopathy of both eyes with macular edema associated with type 2 diabetes mellitus (HCC)  E11.3513 OCT, Retina - OU - Both Eyes    CANCELED: OCT, Retina - OU - Both Eyes    2. Long term (current) use of oral hypoglycemic drugs  Z79.84     3. Long-term (current) use of injectable non-insulin antidiabetic drugs  Z79.85     4. 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 Vitreous hemorrhage of both eyes (HCC)  H43.13     6. Essential hypertension  I10     7. Hypertensive retinopathy of both eyes  H35.033     8. Pseudophakia of both eyes  Z96.1       1-5. Proliferative diabetic retinopathy with DME, TRD, and vitreous hemorrhage OU (OD > OS)  - last A1c: 8.0 on 06.04.24, 9.6 on 11.11.22  - lost to f/u from Jan 2021 to Oct 2021 due to loss  of insurance coverage - pt with complex medical history with hospitalization in Jan-Feb 2020 for bacteremia and  abscess  - history of poor glycemic control for years - s/p IVA OD #1 (06.20.20), #2 (07.01.20), #3 (09.11.20), #4 (10.09.20), #5 (11.06.20), #6 (11.24.21), #7 (12.22.21), #8 (01.19.22) - s/p IVA OS #1 (06.20.20), #2 (07.01.20), #3 (08.13.20), #4 (09.11.20)  #5 (11.06.20), #6 (11.24.21), #7 (12.22.21), #8 (01.19.22) - s/p IVE OU #1 (12.04.20) - sample; #2 (01.06.21), #3 (02.18.22), #4 (03.18.22), #5 (04.18.22), #6 (05.25.22), #7 (07.01.22), #8 (07.29.22), #9 (09.02.22), #10 (09.30.22), #11 (11.04.22), #12 (12.09.23), #13 (01.13.23), #14 (03.02.23), #15 (04.11.23), #16 (06.01.23), #17 (07.11.23), #18 (09.12.23), #19 (11.14.23) - s/p IVE OD #20 (03.12.24), #21 (04.23.24), #22 (06.04.24), #23 (07.16.24), #24 (08.30.24) - s/p IVV OS #1 (03.12.24), #2 (04.23.24), #3 (06.04.24), #4 (07.16.24), #5 (08.30.24)  - s/p STK OS #1 (10.09.20)  - s/p PRP OS (06.10.20)  - s/p PPV/PFC/EL/FAX/silicone oil OD, 07.16.20  - s/p subconj silicon oil removal OD 8.20.20  - s/p PPV/EL/FAX/silicone oil OS, 08.20.20  - BCVA OD: 20/150 OU -- stable  - OCT shows OD: mild interval improvement in IRF/edema central and nasal macula; OS: Interval improvement in diffuse IRF/IRHMat 6 wks  - recommend IVE OD #25 and IVV OS #6 today, 10.15.24 with f/u in 6 weeks  - RBA of procedure discussed, questions answered  - IVV informed consent obtained and signed, 03.12.23 (OU)  - see procedure note             - [employer] benefits investigation begun 01.19.22 -- approved for 2024  - cont topical PF and Prolensa QID OU for possible CME component  - paper script for Prolensa given for [employer] patient assistance program  - f/u 6 weeks -- DFE/OCT, possible injection(s)  6,7. Hypertensive retinopathy OU  - discussed importance of tight BP control  - continue to monitor  8. Pseudophakia OU  - s/p CE with IOL (Dr. Nile Riggs)  Ophthalmic Meds Ordered this visit:  No orders of the defined types were placed in this encounter.    No follow-ups on  file.  There are no Patient Instructions on file for this visit.  This document serves as a record of services personally performed by Karie Chimera, MD, PhD. It was created on their behalf by Charlette Caffey, COT an ophthalmic technician. The creation of this record is the provider's dictation and/or activities during the visit.    Electronically signed by:  Charlette Caffey, COT  05/26/23 7:59 AM   Karie Chimera, M.D., Ph.D. Diseases & Surgery of the Retina and Vitreous [facility]    Abbreviations: M myopia (nearsighted); A astigmatism; H hyperopia (farsighted); P presbyopia; Mrx spectacle prescription;  CTL contact lenses; OD right eye; OS left eye; OU both eyes  XT exotropia; ET esotropia; PEK punctate epithelial keratitis; PEE punctate epithelial erosions; DES dry eye syndrome; MGD meibomian gland dysfunction; ATs artificial tears; PFAT's preservative free artificial tears; NSC nuclear sclerotic cataract; PSC posterior subcapsular cataract; ERM epi-retinal membrane; PVD posterior vitreous detachment; RD retinal detachment; DM diabetes mellitus; DR diabetic retinopathy; NPDR non-proliferative diabetic retinopathy; PDR proliferative diabetic retinopathy; CSME clinically significant macular edema; DME diabetic macular edema; dbh dot blot hemorrhages; CWS cotton wool spot; POAG primary open angle glaucoma; C/D cup-to-disc ratio; HVF humphrey visual field; GVF goldmann visual field; OCT optical coherence tomography; IOP intraocular pressure; BRVO Branch retinal vein occlusion; CRVO central retinal vein occlusion; CRAO central retinal artery occlusion; BRAO branch retinal  artery occlusion; RT retinal tear; SB scleral buckle; PPV pars plana vitrectomy; VH Vitreous hemorrhage; PRP panretinal laser photocoagulation; IVK intravitreal kenalog; VMT vitreomacular traction; MH Macular hole;  NVD neovascularization of the disc; NVE neovascularization elsewhere; AREDS age  related eye disease study; ARMD age related macular degeneration; POAG primary open angle glaucoma; EBMD epithelial/anterior basement membrane dystrophy; ACIOL anterior chamber intraocular lens; IOL intraocular lens; PCIOL posterior chamber intraocular lens; Phaco/IOL phacoemulsification with intraocular lens placement; PRK photorefractive keratectomy; LASIK laser assisted in situ keratomileusis; HTN hypertension; DM diabetes mellitus; COPD chronic obstructive pulmonary disease

## 2023-05-28 ENCOUNTER — Encounter: Payer: Self-pay | Admitting: *Deleted

## 2023-05-28 ENCOUNTER — Ambulatory Visit: Payer: Self-pay | Admitting: *Deleted

## 2023-05-28 NOTE — Patient Outreach (Signed)
Care Coordination   Health Equity Plan Follow Up Visit Note   05/28/2023 Name: Randy Oneal MRN: [id number] DOB: [birth year]  Randy Oneal is a 57 y.o. year old male who sees Luking, Jonna Coup, MD for primary care. I spoke with  Randy Oneal by phone today.  What matters to the patients health and wellness today?  Managing blood sugar    Goals Addressed             This Visit's Progress    Over the next 6 months, patient will have an A1C of less than 8%   On track    Care Coordination Goals: Patient will follow-up endocrinologist every 6 months as recommended A1C was 8.0% on 01/13/2023 Patient will keep reschedule appointment with PCP Missed appointment on 04/28/23 due to inclement weather. Offered to assist with rescheduling. Patient will call to reschedule.  Patient will continue to monitor and record blood sugar 3 times per day and as needed with Dexcom 7, and will call endocrinologist with any readings outside of recommended range Patient will continue to follow a carbohydrate modified diet 3 meals per day with 30 grams of carbohydrates and up to 2 snacks per day, if needed, with less than 15 grams of carbohydrates Patient will take Dexcom to provider visits for blood sugar review Patient will reach out to RN Care Coordinator [phone] with any care coordination or resource needs        SDOH assessments and interventions completed:  Yes  SDOH Interventions Today    Flowsheet Row Most Recent Value  SDOH Interventions   Housing Interventions Intervention Not Indicated  Transportation Interventions Intervention Not Indicated        Care Coordination Interventions:  Yes, provided  Interventions Today    Flowsheet Row Most Recent Value  Chronic Disease   Chronic disease during today's visit Diabetes, Chronic Kidney Disease/End Stage Renal Disease (ESRD)  General Interventions   General Interventions Discussed/Reviewed General Interventions Discussed,  General Interventions Reviewed, Labs, Durable Medical Equipment (DME), Doctor Visits, Vaccines, Annual Eye Exam  Labs Hgb A1c every 3 months, Kidney Function  Vaccines COVID-19, Flu  Doctor Visits Discussed/Reviewed Doctor Visits Discussed, Doctor Visits Reviewed, Annual Wellness Visits, Specialist, PCP  Durable Medical Equipment (DME) Glucomoter, Other  [Dexcom G7. Blood sugar this morning 130]  PCP/Specialist Visits Compliance with follow-up visit  [reschedule appointment with PCP that was missed on 04/28/23. Offered to assist with rescheduling but patient declined and said that he will call today. 07/21/23 with Dr Lonzo Cloud (endocrinology), 06/05/23 AWV, 07/07/23 [name] (retina specialist)]  Exercise Interventions   Exercise Discussed/Reviewed Exercise Discussed, Exercise Reviewed, Physical Activity  Physical Activity Discussed/Reviewed Physical Activity Discussed, Physical Activity Reviewed  [encouraged increased physical activity as tolerated with a goal of 150 minutes per week. Patient currently using a seated pedal bike for lower extremity workout. Would like HHPT to increase upper body strength.]  Education Interventions   Education Provided Provided Printed Education, Provided Education  [printed education on seated exercises]  Provided Verbal Education On Nutrition, When to see the doctor, Foot Care, Eye Care, Labs, Blood Sugar Monitoring, Medication, Exercise, Sick Day Rules  Labs Reviewed Hgb A1c, Kidney Function  [01/13/23 A1C 8.0%]  Nutrition Interventions   Nutrition Discussed/Reviewed Nutrition Discussed, Nutrition Reviewed, Carbohydrate meal planning, Portion sizes, Decreasing sugar intake, Fluid intake, Adding fruits and vegetables  [3 meals per day with 30 GM of CHO and up to 2 snacks per day, if needed, with less than 15 GM  of CHO. Follow renal diet provided by nephrologist/dialysis center.]  Pharmacy Interventions   Pharmacy Dicussed/Reviewed Pharmacy Topics Discussed, Pharmacy  Topics Reviewed, Medications and their functions  [taking medications as prescribed. No questoins or concerns at this time.]  Safety Interventions   Safety Discussed/Reviewed Safety Discussed, Safety Reviewed, Home Safety, Fall Risk       Follow up plan: Follow up call scheduled for 07/02/23    Encounter Outcome:  Patient Visit Completed   Demetrios Loll, RN, BSN Care Management Coordinator [facility]  [facility] Direct Dial: [phone] Main #: [phone]

## 2023-06-12 ENCOUNTER — Other Ambulatory Visit: Payer: Self-pay

## 2023-06-12 ENCOUNTER — Emergency Department (HOSPITAL_COMMUNITY): Payer: Medicare Other

## 2023-06-12 ENCOUNTER — Encounter (HOSPITAL_COMMUNITY): Payer: Self-pay

## 2023-06-12 ENCOUNTER — Inpatient Hospital Stay (HOSPITAL_COMMUNITY)
Admission: EM | Admit: 2023-06-12 | Discharge: 2023-06-15 | DRG: 638 | Disposition: A | Discharge status: Home / Self Care | Payer: Medicare Other | Attending: Internal Medicine | Admitting: Internal Medicine

## 2023-06-12 DIAGNOSIS — E11319 Type 2 diabetes mellitus with unspecified diabetic retinopathy without macular edema: Secondary | ICD-10-CM | POA: Diagnosis present

## 2023-06-12 DIAGNOSIS — S90822A Blister (nonthermal), left foot, initial encounter: Secondary | ICD-10-CM | POA: Diagnosis present

## 2023-06-12 DIAGNOSIS — L97519 Non-pressure chronic ulcer of other part of right foot with unspecified severity: Secondary | ICD-10-CM | POA: Diagnosis present

## 2023-06-12 DIAGNOSIS — D631 Anemia in chronic kidney disease: Secondary | ICD-10-CM | POA: Diagnosis present

## 2023-06-12 DIAGNOSIS — Z885 Allergy status to narcotic agent status: Secondary | ICD-10-CM

## 2023-06-12 DIAGNOSIS — E11621 Type 2 diabetes mellitus with foot ulcer: Secondary | ICD-10-CM | POA: Diagnosis present

## 2023-06-12 DIAGNOSIS — L089 Local infection of the skin and subcutaneous tissue, unspecified: Secondary | ICD-10-CM | POA: Diagnosis present

## 2023-06-12 DIAGNOSIS — N186 End stage renal disease: Secondary | ICD-10-CM | POA: Diagnosis present

## 2023-06-12 DIAGNOSIS — E66813 Obesity, class 3: Secondary | ICD-10-CM | POA: Diagnosis present

## 2023-06-12 DIAGNOSIS — S90821A Blister (nonthermal), right foot, initial encounter: Secondary | ICD-10-CM | POA: Diagnosis present

## 2023-06-12 DIAGNOSIS — E11628 Type 2 diabetes mellitus with other skin complications: Principal | ICD-10-CM | POA: Diagnosis present

## 2023-06-12 DIAGNOSIS — Z8249 Family history of ischemic heart disease and other diseases of the circulatory system: Secondary | ICD-10-CM

## 2023-06-12 DIAGNOSIS — E1140 Type 2 diabetes mellitus with diabetic neuropathy, unspecified: Secondary | ICD-10-CM | POA: Diagnosis present

## 2023-06-12 DIAGNOSIS — E1151 Type 2 diabetes mellitus with diabetic peripheral angiopathy without gangrene: Secondary | ICD-10-CM | POA: Diagnosis present

## 2023-06-12 DIAGNOSIS — Z79899 Other long term (current) drug therapy: Secondary | ICD-10-CM

## 2023-06-12 DIAGNOSIS — Z992 Dependence on renal dialysis: Secondary | ICD-10-CM | POA: Diagnosis not present

## 2023-06-12 DIAGNOSIS — E1165 Type 2 diabetes mellitus with hyperglycemia: Secondary | ICD-10-CM | POA: Diagnosis present

## 2023-06-12 DIAGNOSIS — H35039 Hypertensive retinopathy, unspecified eye: Secondary | ICD-10-CM | POA: Diagnosis present

## 2023-06-12 DIAGNOSIS — M898X9 Other specified disorders of bone, unspecified site: Secondary | ICD-10-CM | POA: Diagnosis present

## 2023-06-12 DIAGNOSIS — Z6841 Body Mass Index (BMI) 40.0 and over, adult: Secondary | ICD-10-CM | POA: Diagnosis not present

## 2023-06-12 DIAGNOSIS — I120 Hypertensive chronic kidney disease with stage 5 chronic kidney disease or end stage renal disease: Secondary | ICD-10-CM | POA: Diagnosis present

## 2023-06-12 DIAGNOSIS — L97529 Non-pressure chronic ulcer of other part of left foot with unspecified severity: Secondary | ICD-10-CM | POA: Diagnosis present

## 2023-06-12 DIAGNOSIS — E1122 Type 2 diabetes mellitus with diabetic chronic kidney disease: Secondary | ICD-10-CM | POA: Diagnosis present

## 2023-06-12 DIAGNOSIS — Z794 Long term (current) use of insulin: Secondary | ICD-10-CM

## 2023-06-12 LAB — BASIC METABOLIC PANEL
Anion gap: 14 (ref 5–15)
BUN: 21 mg/dL — ABNORMAL HIGH (ref 6–20)
CO2: 32 mmol/L (ref 22–32)
Calcium: 9 mg/dL (ref 8.9–10.3)
Chloride: 89 mmol/L — ABNORMAL LOW (ref 98–111)
Creatinine, Ser: 6.11 mg/dL — ABNORMAL HIGH (ref 0.61–1.24)
GFR, Estimated: 10 mL/min — ABNORMAL LOW (ref >60)
Glucose, Bld: 157 mg/dL — ABNORMAL HIGH (ref 70–99)
Potassium: 3.6 mmol/L (ref 3.5–5.1)
Sodium: 135 mmol/L (ref 135–145)

## 2023-06-12 LAB — CBC WITH DIFFERENTIAL/PLATELET
Abs Immature Granulocytes: 0.01 10*3/uL (ref 0.00–0.07)
Basophils Absolute: 0 10*3/uL (ref 0.0–0.1)
Basophils Relative: 0 %
Eosinophils Absolute: 0 10*3/uL (ref 0.0–0.5)
Eosinophils Relative: 1 %
HCT: 39.2 % (ref 39.0–52.0)
Hemoglobin: 12.7 g/dL — ABNORMAL LOW (ref 13.0–17.0)
Immature Granulocytes: 0 %
Lymphocytes Relative: 21 %
Lymphs Abs: 1.1 10*3/uL (ref 0.7–4.0)
MCH: 28.9 pg (ref 26.0–34.0)
MCHC: 32.4 g/dL (ref 30.0–36.0)
MCV: 89.1 fL (ref 80.0–100.0)
Monocytes Absolute: 0.6 10*3/uL (ref 0.1–1.0)
Monocytes Relative: 11 %
Neutro Abs: 3.6 10*3/uL (ref 1.7–7.7)
Neutrophils Relative %: 67 %
Platelets: 197 10*3/uL (ref 150–400)
RBC: 4.4 MIL/uL (ref 4.22–5.81)
RDW: 16.3 % — ABNORMAL HIGH (ref 11.5–15.5)
WBC: 5.2 10*3/uL (ref 4.0–10.5)
nRBC: 0 % (ref 0.0–0.2)

## 2023-06-12 LAB — LACTIC ACID, PLASMA
Lactic Acid, Venous: 1.8 mmol/L (ref 0.5–1.9)
Lactic Acid, Venous: 1.4 mmol/L (ref 0.5–1.9)

## 2023-06-12 LAB — GLUCOSE, CAPILLARY
Glucose-Capillary: 149 mg/dL — ABNORMAL HIGH (ref 70–99)
Glucose-Capillary: 213 mg/dL — ABNORMAL HIGH (ref 70–99)

## 2023-06-12 LAB — SEDIMENTATION RATE: Sed Rate: 110 mm/h — ABNORMAL HIGH (ref 0–16)

## 2023-06-12 LAB — C-REACTIVE PROTEIN: CRP: 11.3 mg/dL — ABNORMAL HIGH (ref <1.0)

## 2023-06-12 MED ORDER — SUCROFERRIC OXYHYDROXIDE 500 MG PO CHEW
1000.0000 mg | CHEWABLE_TABLET | Freq: Three times a day (TID) | ORAL | Status: DC
  Administered 2023-06-12 – 2023-06-15 (×9): 1000 mg via ORAL
  Filled 2023-06-12 (×11): qty 2

## 2023-06-12 MED ORDER — VANCOMYCIN HCL IN DEXTROSE 1-5 GM/200ML-% IV SOLN
1000.0000 mg | Freq: Once | INTRAVENOUS | Status: AC
  Administered 2023-06-12: 1000 mg via INTRAVENOUS
  Filled 2023-06-12: qty 200

## 2023-06-12 MED ORDER — SODIUM CHLORIDE 0.9 % IV SOLN
2.0000 g | Freq: Once | INTRAVENOUS | Status: AC
  Administered 2023-06-12: 2 g via INTRAVENOUS
  Filled 2023-06-12: qty 12.5

## 2023-06-12 MED ORDER — ACETAMINOPHEN 325 MG PO TABS: 650.0000 mg | ORAL_TABLET | Freq: Four times a day (QID) | ORAL | Status: DC | PRN

## 2023-06-12 MED ORDER — KETOROLAC TROMETHAMINE 0.5 % OP SOLN
1.0000 [drp] | Freq: Four times a day (QID) | OPHTHALMIC | Status: DC
  Administered 2023-06-12 – 2023-06-15 (×13): 1 [drp] via OPHTHALMIC
  Filled 2023-06-12: qty 5

## 2023-06-12 MED ORDER — CINACALCET HCL 30 MG PO TABS
60.0000 mg | ORAL_TABLET | ORAL | Status: DC
  Administered 2023-06-12 – 2023-06-15 (×2): 60 mg via ORAL
  Filled 2023-06-12 (×4): qty 2

## 2023-06-12 MED ORDER — INSULIN ASPART 100 UNIT/ML FLEXPEN
9.0000 [IU] | PEN_INJECTOR | Freq: Three times a day (TID) | SUBCUTANEOUS | Status: DC
Start: 2023-06-12 — End: 2023-06-12
  Filled 2023-06-12: qty 3

## 2023-06-12 MED ORDER — INSULIN ASPART 100 UNIT/ML IJ SOLN
0.0000 [IU] | Freq: Every day | INTRAMUSCULAR | Status: DC
Start: 2023-06-12 — End: 2023-06-16
  Administered 2023-06-12: 2 [IU] via SUBCUTANEOUS

## 2023-06-12 MED ORDER — INSULIN ASPART 100 UNIT/ML IJ SOLN
0.0000 [IU] | Freq: Three times a day (TID) | INTRAMUSCULAR | Status: DC
  Administered 2023-06-12: 1 [IU] via SUBCUTANEOUS
  Administered 2023-06-13 (×2): 2 [IU] via SUBCUTANEOUS
  Administered 2023-06-13: 1 [IU] via SUBCUTANEOUS
  Administered 2023-06-14: 2 [IU] via SUBCUTANEOUS
  Administered 2023-06-14 – 2023-06-15 (×2): 1 [IU] via SUBCUTANEOUS

## 2023-06-12 MED ORDER — INSULIN GLARGINE-YFGN 100 UNIT/ML ~~LOC~~ SOLN
21.0000 [IU] | Freq: Every day | SUBCUTANEOUS | Status: DC
  Administered 2023-06-12 – 2023-06-14 (×3): 21 [IU] via SUBCUTANEOUS
  Filled 2023-06-12 (×4): qty 0.21

## 2023-06-12 MED ORDER — ONDANSETRON HCL 4 MG/2ML IJ SOLN: 4.0000 mg | Freq: Four times a day (QID) | INTRAMUSCULAR | Status: DC | PRN

## 2023-06-12 MED ORDER — HEPARIN SODIUM (PORCINE) 5000 UNIT/ML IJ SOLN
5000.0000 [IU] | Freq: Three times a day (TID) | INTRAMUSCULAR | Status: DC
  Administered 2023-06-12 – 2023-06-15 (×9): 5000 [IU] via SUBCUTANEOUS
  Filled 2023-06-12 (×9): qty 1

## 2023-06-12 MED ORDER — VANCOMYCIN HCL IN DEXTROSE 1-5 GM/200ML-% IV SOLN
1000.0000 mg | INTRAVENOUS | Status: DC
  Filled 2023-06-12: qty 200

## 2023-06-12 MED ORDER — INSULIN ASPART 100 UNIT/ML IJ SOLN
9.0000 [IU] | Freq: Three times a day (TID) | INTRAMUSCULAR | Status: DC
  Administered 2023-06-12 – 2023-06-15 (×10): 9 [IU] via SUBCUTANEOUS
  Filled 2023-06-12: qty 0.09

## 2023-06-12 MED ORDER — PREDNISOLONE ACETATE 1 % OP SUSP
1.0000 [drp] | Freq: Four times a day (QID) | OPHTHALMIC | Status: DC
Start: 2023-06-12 — End: 2023-06-16
  Administered 2023-06-12 – 2023-06-15 (×13): 1 [drp] via OPHTHALMIC
  Filled 2023-06-12: qty 5

## 2023-06-12 MED ORDER — ACETAMINOPHEN 650 MG RE SUPP: 650.0000 mg | Freq: Four times a day (QID) | RECTAL | Status: DC | PRN

## 2023-06-12 MED ORDER — SODIUM CHLORIDE 0.9 % IV SOLN: 2.0000 g | INTRAVENOUS | Status: DC

## 2023-06-12 MED ORDER — METRONIDAZOLE 500 MG/100ML IV SOLN
500.0000 mg | Freq: Once | INTRAVENOUS | Status: AC
  Administered 2023-06-12: 500 mg via INTRAVENOUS
  Filled 2023-06-12: qty 100

## 2023-06-12 MED ORDER — ONDANSETRON HCL 4 MG PO TABS: 4.0000 mg | ORAL_TABLET | Freq: Four times a day (QID) | ORAL | Status: DC | PRN

## 2023-06-12 NOTE — Progress Notes (Signed)
Administer both doses of IV vancomycin as per pharmacist Minh t/o. Patient needs 2 gms ttl.

## 2023-06-12 NOTE — H&P (Addendum)
History and Physical    Randy Oneal ZHY:[id number] DOB: 01-05-1966 DOA: 06/12/2023  PCP: [name], MD   Patient coming from: Dialysis center  Chief Complaint: Worsening bilateral foot wounds  HPI: Randy Oneal is a 57 y.o. male with medical history significant for ESRD on HD MWF, hypertension, insulin-dependent type 2 diabetes, retinopathy, and neuropathy, who was told to come to the ED for further evaluation of his bilateral foot wounds that have been ongoing since August of this year.  He has been managing them at home with dressing changes and has not seen a podiatrist over 1 year as he was discharged from podiatry follow-up.  He states that he does not have any pain and is able to ambulate on both feet, but most concerning was the fact that he developed blisters on the dorsum of both feet approximately 1 week ago.  He states that his blood glucoses usually adequately controlled and he has a Dexcom and monitors this routinely.  He states that his recent A1c was 8.7%.  He has completed hemodialysis on the day of admission.   ED Course: Blood cultures ordered and no leukocytosis noted.  He has been started on double antibiotics in the ED and x-rays are pending.  Review of Systems: Reviewed as noted above, otherwise negative.  Past Medical History:  Diagnosis Date   Anemia    Cataract 07/02/2020   Diabetes mellitus    Type 2    Diabetic retinopathy of both eyes (HCC) 01/31/2019   [name] 01/2019   ESRD (end stage renal disease) (HCC)    [place] Frenis[place]   Herpes simplex virus (HSV) infection    OU   Hypertension    Hypertensive retinopathy    OU   Retinal detachment    OS   Sepsis due to Streptococcus, group B (HCC) 10/05/2018    Past Surgical History:  Procedure Laterality Date   25 GAUGE PARS PLANA VITRECTOMY WITH 20 GAUGE MVR PORT FOR MACULAR HOLE Right 02/24/2019   Procedure: 25 GAUGE PARS PLANA VITRECTOMY WITH 20 GAUGE MVR PORT FOR MACULAR HOLE;  Surgeon:  [name], MD;  Location: [facility] OR;  Service: Ophthalmology;  Laterality: Right;   APPLICATION OF WOUND VAC Left 08/27/2018   Procedure: APPLICATION OF WOUND VAC, left neck;  Surgeon: [name], MD;  Location: [facility] OR;  Service: Thoracic;  Laterality: Left;   APPLICATION OF WOUND VAC Left 08/30/2018   Procedure: APPLICATION OF WOUND VAC;  Surgeon: [name], MD;  Location: [facility] OR;  Service: Thoracic;  Laterality: Left;   AV FISTULA PLACEMENT Left 02/15/2020   Procedure: LEFT ARM ARTERIOVENOUS (AV) FISTULA CREATION;  Surgeon: [name], MD;  Location: [facility] OR;  Service: Vascular;  Laterality: Left;   CATARACT EXTRACTION Bilateral    CATARACT EXTRACTION W/ INTRAOCULAR LENS  IMPLANT, BILATERAL     COLONOSCOPY  06/24/2011   Procedure: COLONOSCOPY;  Surgeon: [name], MD;  Location: [facility];  Service: Endoscopy;  Laterality: N/A;  8:30 AM   EYE SURGERY     GAS/FLUID EXCHANGE Left 03/31/2019   Procedure: Gas/Fluid Exchange;  Surgeon: [name], MD;  Location: [facility] OR;  Service: Ophthalmology;  Laterality: Left;   INJECTION OF SILICONE OIL  02/24/2019   Procedure: Injection Of Silicone Oil;  Surgeon: [name], MD;  Location: [facility] OR;  Service: Ophthalmology;;   INSERTION OF DIALYSIS CATHETER Right 12/05/2019   Procedure: INSERTION OF DIALYSIS CATHETER RIGHT SUBCLAVIAN;  Surgeon: Algis Greenhouse  C, MD;  Location: [facility] ORS;  Service: General;  Laterality: Right;   INSERTION OF DIALYSIS CATHETER Right 12/07/2019   Procedure: Minor Dialysis catheter in place- need to reposition/ adjust catheter to help with flows;  Surgeon: [name], MD;  Location: [facility] ORS;  Service: General;  Laterality: Right;  procedure room case with 1% lidocaine, full sterile drape,  towels, full gown, large chloraprep, 4-0 Monocryl and dermabond    INSERTION OF DIALYSIS CATHETER Right 12/08/2019   Procedure: INSERTION OF DIALYSIS CATHETER EXCHANGE;  Surgeon: [name], MD;  Location:  [facility] ORS;  Service: General;  Laterality: Right;   IR ANGIOGRAM EXTREMITY RIGHT  03/15/2021   IR RADIOLOGIST EVAL & MGMT  02/07/2021   IR RADIOLOGIST EVAL & MGMT  05/09/2021   IR US GUIDE VASC ACCESS RIGHT  03/15/2021   MEMBRANE PEEL Right 02/24/2019   Procedure: [place] Flax;  Surgeon: [name], MD;  Location: [facility] OR;  Service: Ophthalmology;  Laterality: Right;   PARS PLANA VITRECTOMY Left 03/31/2019   Procedure: PARS PLANA VITRECTOMY WITH 25 GAUGE WITH MEMBRANE PEEL;  Surgeon: [name], MD;  Location: [facility] OR;  Service: Ophthalmology;  Laterality: Left;   PHOTOCOAGULATION WITH LASER Right 02/24/2019   Procedure: Photocoagulation With Laser;  Surgeon: [name], MD;  Location: [facility] OR;  Service: Ophthalmology;  Laterality: Right;   PHOTOCOAGULATION WITH LASER Left 03/31/2019   Procedure: Photocoagulation With Laser;  Surgeon: [name], MD;  Location: [facility] OR;  Service: Ophthalmology;  Laterality: Left;   RETINAL DETACHMENT SURGERY Left 03/31/2019   TRD Repair - Dr. [name]   SILICON OIL REMOVAL Right 03/31/2019   Procedure: Silicon Oil Removal;  Surgeon: [name], MD;  Location: [facility] OR;  Service: Ophthalmology;  Laterality: Right;   STERNAL WOUND DEBRIDEMENT Left 08/27/2018   Procedure: Incision and DEBRIDEMENT Left Chest, Neck and Mediastinum;  Surgeon: [name], MD;  Location: [facility] OR;  Service: Thoracic;  Laterality: Left;   STERNAL WOUND DEBRIDEMENT Left 08/30/2018   Procedure: WOUND VAC CHANGE, LEFT CHEST AND NECK, POSSIBLE DEBRIDEMENT;  Surgeon: [name], MD;  Location: [facility] OR;  Service: Thoracic;  Laterality: Left;     reports that he has never smoked. He has never used smokeless tobacco. He reports that he does not drink alcohol and does not use drugs.  Allergies  Allergen Reactions   Tramadol Nausea And Vomiting    Family History  Problem Relation Age of Onset   Hypertension Mother    Colon cancer Neg Hx     Prior to Admission medications   Medication  Sig Start Date End Date Taking? Authorizing Provider  acetaminophen (TYLENOL) 500 MG tablet Take 1,000 mg by mouth every 6 (six) hours as needed for moderate pain or headache.   Yes [provider]  Bromfenac Sodium (PROLENSA) 0.07 % SOLN Place 1 drop into both eyes 4 (four) times daily. 05/26/23  Yes [name], MD  cinacalcet [facility]) 60 MG tablet Take 60 mg by mouth every Monday, Wednesday, and Friday.   Yes [provider]  insulin aspart (NOVOLOG FLEXPEN) 100 UNIT/ML FlexPen 7 units with breakfast and lunch, and 8 units with supper CF NovoLog(BG -130/40)  Max daily 45 units Patient taking differently: Inject 9-10 Units into the skin 3 (three) times daily with meals. 01/13/23  Yes Shamleffer, Konrad Dolores, MD  insulin degludec (TRESIBA FLEXTOUCH) 100 UNIT/ML FlexTouch Pen Inject 22 Units into the skin at bedtime. Patient taking differently: Inject 21 Units into the  skin at bedtime. 01/13/23  Yes Shamleffer, Konrad Dolores, MD  prednisoLONE acetate (PRED FORTE) 1 % ophthalmic suspension PLACE (1) DROP INTO BOTH EYES FOUR TIMES DAILY 09/18/22  Yes [name], MD  sucroferric oxyhydroxide (VELPHORO) 500 MG chewable tablet Chew 1,000 mg by mouth 3 (three) times daily with meals.   Yes [provider]    Physical Exam: Vitals:   06/12/23 1142 06/12/23 1146  BP: (!) 145/92   Pulse: (!) 101   Resp: 18   Temp: 98.9 F (37.2 C)   TempSrc: Oral   SpO2: 100%   Weight:  132 kg  Height:  5\' 11"  (1.803 m)    Constitutional: NAD, calm, comfortable Vitals:   06/12/23 1142 06/12/23 1146  BP: (!) 145/92   Pulse: (!) 101   Resp: 18   Temp: 98.9 F (37.2 C)   TempSrc: Oral   SpO2: 100%   Weight:  132 kg  Height:  5\' 11"  (1.803 m)   Eyes: lids and conjunctivae normal Neck: normal, supple Respiratory: clear to auscultation bilaterally. Normal respiratory effort. No accessory muscle use.  Cardiovascular: Regular rate and rhythm, no murmurs. Abdomen: no  tenderness, no distention. Bowel sounds positive.  Musculoskeletal:     Psychiatric: Flat affect  Labs on Admission: I have personally reviewed following labs and imaging studies  CBC: Recent Labs  Lab 06/12/23 1230  WBC 5.2  NEUTROABS 3.6  HGB 12.7*  HCT 39.2  MCV 89.1  PLT 197   Basic Metabolic Panel: Recent Labs  Lab 06/12/23 1230  NA 135  K 3.6  CL 89*  CO2 32  GLUCOSE 157*  BUN 21*  CREATININE 6.11*  CALCIUM 9.0   GFR: Estimated Creatinine Clearance: 18.5 mL/min (A) (by C-G formula based on SCr of 6.11 mg/dL (H)). Liver Function Tests: No results for input(s): "AST", "ALT", "ALKPHOS", "BILITOT", "PROT", "ALBUMIN" in the last 168 hours. No results for input(s): "LIPASE", "AMYLASE" in the last 168 hours. No results for input(s): "AMMONIA" in the last 168 hours. Coagulation Profile: No results for input(s): "INR", "PROTIME" in the last 168 hours. Cardiac Enzymes: No results for input(s): "CKTOTAL", "CKMB", "CKMBINDEX", "TROPONINI" in the last 168 hours. BNP (last 3 results) No results for input(s): "PROBNP" in the last 8760 hours. HbA1C: No results for input(s): "HGBA1C" in the last 72 hours. CBG: No results for input(s): "GLUCAP" in the last 168 hours. Lipid Profile: No results for input(s): "CHOL", "HDL", "LDLCALC", "TRIG", "CHOLHDL", "LDLDIRECT" in the last 72 hours. Thyroid Function Tests: No results for input(s): "TSH", "T4TOTAL", "FREET4", "T3FREE", "THYROIDAB" in the last 72 hours. Anemia Panel: No results for input(s): "VITAMINB12", "FOLATE", "FERRITIN", "TIBC", "IRON", "RETICCTPCT" in the last 72 hours. Urine analysis:    Component Value Date/Time   COLORURINE YELLOW 08/16/2018 1605   APPEARANCEUR HAZY (A) 08/16/2018 1605   LABSPEC 1.016 08/16/2018 1605   PHURINE 5.0 08/16/2018 1605   GLUCOSEU >=500 (A) 08/16/2018 1605   HGBUR LARGE (A) 08/16/2018 1605   BILIRUBINUR NEGATIVE 08/16/2018 1605   KETONESUR 20 (A) 08/16/2018 1605   PROTEINUR  Positive (A) 02/16/2019 1453   PROTEINUR 100 (A) 08/16/2018 1605   NITRITE NEGATIVE 08/16/2018 1605   LEUKOCYTESUR NEGATIVE 08/16/2018 1605    Radiological Exams on Admission: No results found.   Assessment/Plan Principal Problem:   Diabetic foot infection (HCC) Active Problems:   Diabetic neuropathy (HCC)   ESRD on hemodialysis (HCC)    Worsening diabetic foot infections with concern for cellulitis/osteomyelitis -Follow blood cultures -Follow-up diagnostic x-rays -  Continue vancomycin and cefepime empirically -Appreciate further evaluation at [facility] by Dr. Lajoyce Corners by 11/4 -Robust pulses noted on dorsalis pedis bilaterally -Appreciate wound care consultation and continue wound care  Type 2 diabetes-insulin-dependent with mild hyperglycemia -Continue carb modified diet -Check A1c -Continue home insulin -SSI coverage  ESRD on HD MWF -Completed hemodialysis today and will not need another session until 11/4 -Contact nephrology when needed for dialysis  Hypertension -Continue to monitor, currently controlled and does not appear to be on home medications for this  Morbid obesity -BMI 40.59   DVT prophylaxis: Heparin Code Status: Full Family Communication: None at bedside Disposition Plan: Admit for evaluation and management of diabetic foot wounds Consults called: EDP spoke with orthopedics Dr. Aundria Rud.  Contacted Dr. Lajoyce Corners who will evaluate by 11/4. Admission status: Inpatient, MedSurg  Severity of Illness: The appropriate patient status for this patient is INPATIENT. Inpatient status is judged to be reasonable and necessary in order to provide the required intensity of service to ensure the patient's safety. The patient's presenting symptoms, physical exam findings, and initial radiographic and laboratory data in the context of their chronic comorbidities is felt to place them at high risk for further clinical deterioration. Furthermore, it is not anticipated that the patient  will be medically stable for discharge from the hospital within 2 midnights of admission.   * I certify that at the point of admission it is my clinical judgment that the patient will require inpatient hospital care spanning beyond 2 midnights from the point of admission due to high intensity of service, high risk for further deterioration and high frequency of surveillance required.*   Danon Lograsso D Yamile Roedl DO [facility]  If 7PM-7AM, please contact night-coverage [email or web address]  06/12/2023, 3:39 PM

## 2023-06-12 NOTE — Consult Note (Signed)
Pharmacy Antibiotic Note  ASSESSMENT: 57 y.o. male with PMH diabetes, ESRD-HD (MWF) is presenting with  wounds on both feet . Patient presented for dialysis at his center who recommended he come to hospital given extent of wounds. Patient has been self-treating them at home without improvement. He is afebrile and without leukocytosis. Pharmacy has been consulted to manage vancomycin dosing.  Patient measurements: Height: 5\' 11"  (180.3 cm) Weight: 132 kg (291 lb) IBW/kg (Calculated) : 75.3  Vital signs: Temp: 98.9 F (37.2 C) (11/01 1142) Temp Source: Oral (11/01 1142) BP: 145/92 (11/01 1142) Pulse Rate: 101 (11/01 1142) Recent Labs  Lab 06/12/23 1230  WBC 5.2  CREATININE 6.11*   Estimated Creatinine Clearance: 18.5 mL/min (A) (by C-G formula based on SCr of 6.11 mg/dL (H)).  Allergies: Allergies  Allergen Reactions   Tramadol Nausea And Vomiting    Antimicrobials this admission: Cefepime 11/1 x 1 Flagyl 11/1 x 1 Vancomycin 11/1 >>  Dose adjustments this admission: N/A  Microbiology results: 11/1 BCx: sent  PLAN: Initiate vancomycin 2000 mg today as a loading dose followed by supplemental vancomycin 1000 mg with every dialysis session Patient completed dialysis earlier today, so the only vancomycin he needs today is his load Follow up culture results to assess for antibiotic optimization. Monitor renal function to assess for any necessary antibiotic dosing changes.   Thank you for allowing pharmacy to be a part of this patient's care.  Will M. Dareen Piano, PharmD Clinical Pharmacist 06/12/2023 3:36 PM

## 2023-06-12 NOTE — ED Provider Notes (Signed)
[place] EMERGENCY DEPARTMENT AT [facility] Provider Note  CSN: [id number] Arrival date & time: 06/12/23 1124  Chief Complaint(s) Foot Injury  HPI Randy Oneal is a 57 y.o. male history of diabetes, end-stage renal disease on dialysis, presenting to the emergency department for wound.  Patient reports wound to both feet.  Reports these have been going on for around a month now.  He reports that his dialysis center recommended that he come over to get evaluated given the extent of the wounds.  No fevers or chills.  Reports minimal pain.  Has been trying to take care of this at home but not really getting any better.  Has been walking.   Past Medical History Past Medical History:  Diagnosis Date   Anemia    Cataract 07/02/2020   Diabetes mellitus    Type 2    Diabetic retinopathy of both eyes (HCC) 01/31/2019   [name] 01/2019   ESRD (end stage renal disease) (HCC)    [place] Frenisus   Herpes simplex virus (HSV) infection    OU   Hypertension    Hypertensive retinopathy    OU   Retinal detachment    OS   Sepsis due to Streptococcus, group B (HCC) 10/05/2018   Patient Active Problem List   Diagnosis Date Noted   Diabetic foot infection (HCC) 06/12/2023   Diabetic ulcer of right midfoot associated with diabetes mellitus due to underlying condition, with fat layer exposed (HCC) 04/17/2022   COVID-19 10/23/2021   Other fluid overload 07/30/2021   Nausea 05/22/2021   Peripheral neuropathy 12/19/2020   Type 2 diabetes mellitus with retinopathy, with long-term current use of insulin (HCC) 07/12/2020   Type 2 diabetes mellitus with chronic kidney disease on chronic dialysis, with long-term current use of insulin (HCC) 07/12/2020   Type 2 diabetes mellitus with diabetic neuropathy, with long-term current use of insulin (HCC) 07/12/2020   Bilateral retinal detachment 07/02/2020   Blurry vision, bilateral 07/02/2020   Cataract 07/02/2020   ESRD on hemodialysis  (HCC) 07/02/2020   Gastroesophageal reflux disease without esophagitis 07/02/2020   History of Bell's palsy 07/02/2020   Hyperlipidemia 07/02/2020   Personal history of septic arthritis 07/02/2020   Hypercalcemia 03/05/2020   Allergy, unspecified, initial encounter 02/29/2020   Anaphylactic shock, unspecified, initial encounter 02/29/2020   Hypothyroidism, unspecified 12/10/2019   Coagulation defect, unspecified (HCC) 12/09/2019   Diarrhea, unspecified 12/09/2019   Dyspnea, unspecified 12/09/2019   Iron deficiency anemia, unspecified 12/09/2019   Pain, unspecified 12/09/2019   Pruritus, unspecified 12/09/2019   Secondary hyperparathyroidism of renal origin (HCC) 12/09/2019   Displacement of vascular dialysis catheter (HCC)    Acute on chronic renal failure (HCC) 12/01/2019   End stage kidney disease (HCC)    Skin callus 08/03/2019   Pain due to onychomycosis of toenails of both feet 04/20/2019   Posterior tibial tendon dysfunction (PTTD) of both lower extremities 04/20/2019   Diabetic neuropathy (HCC) 04/20/2019   Diabetic retinopathy of both eyes (HCC) 01/31/2019   Type 2 diabetes mellitus with hyperglycemia, with long-term current use of insulin (HCC) 10/20/2018   Sepsis due to Streptococcus, group B (HCC) 10/05/2018   Dietary counseling and surveillance 09/15/2018   Non compliance with medical treatment 09/15/2018   Open wound of right great toe    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/27/2018   Cellulitis of great toe, right    Diabetic hyperosmolar non-ketotic state (HCC) 08/17/2018   Hyperglycemia 08/17/2018   AKI (acute kidney injury) (HCC) 08/16/2018  Hyponatremia 08/16/2018   Hyperlipidemia associated with type 2 diabetes mellitus (HCC) 03/04/2017   Erectile dysfunction 03/04/2017   Personal history of noncompliance with medical treatment, presenting hazards to health 11/22/2015   Essential hypertension 01/30/2014   Loss of weight 01/30/2014   Type 2 diabetes mellitus not at  goal [facility]) 03/30/2013   Home Medication(s) Prior to Admission medications   Medication Sig Start Date End Date Taking? Authorizing Provider  acetaminophen (TYLENOL) 500 MG tablet Take 1,000 mg by mouth every 6 (six) hours as needed for moderate pain or headache.    [provider]  amLODipine (NORVASC) 2.5 MG tablet Take 1 tablet (2.5 mg total) by mouth daily. 04/02/22   [name], MD  atorvastatin (LIPITOR) 10 MG tablet Take 1 tablet by mouth daily. 01/26/18   [provider]  blood glucose meter kit and supplies Use to check sugars 3 times daily 04/02/22   [name], MD  Bromfenac Sodium (PROLENSA) 0.07 % SOLN Place 1 drop into both eyes 4 (four) times daily. 05/26/23   [name], MD  Cholecalciferol 1.25 MG (50000 UT) capsule Take by mouth. 09/05/21   [provider]  cinacalcet (SENSIPAR) 30 MG tablet Take 30 mg by mouth daily with supper. 04/12/20   [provider]  Continuous Blood Gluc Sensor (DEXCOM G7 SENSOR) MISC 1 Device by Does not apply route as directed. 08/27/22   Shamleffer, Konrad Dolores, MD  glucose blood (ONETOUCH ULTRA) test strip 1 each by Other route in the morning, at noon, in the evening, and at bedtime. Use as instructed 10/10/20   Shamleffer, Konrad Dolores, MD  insulin aspart (NOVOLOG FLEXPEN) 100 UNIT/ML FlexPen 7 units with breakfast and lunch, and 8 units with supper CF NovoLog(BG -130/40)  Max daily 45 units 01/13/23   Shamleffer, Konrad Dolores, MD  insulin degludec (TRESIBA FLEXTOUCH) 100 UNIT/ML FlexTouch Pen Inject 22 Units into the skin at bedtime. 01/13/23   Shamleffer, Konrad Dolores, MD  Insulin Pen Needle 32G X 4 MM MISC 1 Device by Does not apply route in the morning, at noon, in the evening, and at bedtime. 01/13/23   Shamleffer, Konrad Dolores, MD  lidocaine-prilocaine (EMLA) cream Apply 1 application topically Every Tuesday,Thursday,and Saturday with dialysis. 05/21/20   [provider]  MICROLET LANCETS  MISC USE FOUR TIMES A DAY AS DIRECTED. 11/19/16   Roma Kayser, MD  nystatin (MYCOSTATIN/NYSTOP) powder Apply topically 2 (two) times daily. To groin and scrotum 09/04/18   Barrett, Erin R, PA-C  prednisoLONE acetate (PRED FORTE) 1 % ophthalmic suspension PLACE (1) DROP INTO BOTH EYES FOUR TIMES DAILY 09/18/22   [name], MD  sildenafil (VIAGRA) 50 MG tablet Take by mouth. 02/26/22   [provider]  sucroferric oxyhydroxide (VELPHORO) 500 MG chewable tablet Chew 1,000 mg by mouth 3 (three) times daily with meals.    [provider]  Past Surgical History Past Surgical History:  Procedure Laterality Date   25 GAUGE PARS PLANA VITRECTOMY WITH 20 GAUGE MVR PORT FOR MACULAR HOLE Right 02/24/2019   Procedure: 25 GAUGE PARS PLANA VITRECTOMY WITH 20 GAUGE MVR PORT FOR MACULAR HOLE;  Surgeon: [name], MD;  Location: [facility] OR;  Service: Ophthalmology;  Laterality: Right;   APPLICATION OF WOUND VAC Left 08/27/2018   Procedure: APPLICATION OF WOUND VAC, left neck;  Surgeon: [name], MD;  Location: [facility] OR;  Service: Thoracic;  Laterality: Left;   APPLICATION OF WOUND VAC Left 08/30/2018   Procedure: APPLICATION OF WOUND VAC;  Surgeon: [name], MD;  Location: [facility] OR;  Service: Thoracic;  Laterality: Left;   AV FISTULA PLACEMENT Left 02/15/2020   Procedure: LEFT ARM ARTERIOVENOUS (AV) FISTULA CREATION;  Surgeon: [name], MD;  Location: [facility] OR;  Service: Vascular;  Laterality: Left;   CATARACT EXTRACTION Bilateral    CATARACT EXTRACTION W/ INTRAOCULAR LENS  IMPLANT, BILATERAL     COLONOSCOPY  06/24/2011   Procedure: COLONOSCOPY;  Surgeon: [name], MD;  Location: [facility] ENDO SUITE;  Service: Endoscopy;  Laterality: N/A;  8:30 AM   EYE SURGERY     GAS/FLUID EXCHANGE Left 03/31/2019   Procedure: Gas/Fluid Exchange;  Surgeon:  [name], MD;  Location: [facility] OR;  Service: Ophthalmology;  Laterality: Left;   INJECTION OF SILICONE OIL  02/24/2019   Procedure: Injection Of Silicone Oil;  Surgeon: [name], MD;  Location: [facility] OR;  Service: Ophthalmology;;   INSERTION OF DIALYSIS CATHETER Right 12/05/2019   Procedure: INSERTION OF DIALYSIS CATHETER RIGHT SUBCLAVIAN;  Surgeon: [name], MD;  Location: [facility] ORS;  Service: General;  Laterality: Right;   INSERTION OF DIALYSIS CATHETER Right 12/07/2019   Procedure: Minor Dialysis catheter in place- need to reposition/ adjust catheter to help with flows;  Surgeon: [name], MD;  Location: [facility] ORS;  Service: General;  Laterality: Right;  procedure room case with 1% lidocaine, full sterile drape,  towels, full gown, large chloraprep, 4-0 Monocryl and dermabond    INSERTION OF DIALYSIS CATHETER Right 12/08/2019   Procedure: INSERTION OF DIALYSIS CATHETER EXCHANGE;  Surgeon: [name], MD;  Location: [facility] ORS;  Service: General;  Laterality: Right;   IR ANGIOGRAM EXTREMITY RIGHT  03/15/2021   IR RADIOLOGIST EVAL & MGMT  02/07/2021   IR RADIOLOGIST EVAL & MGMT  05/09/2021   IR US GUIDE VASC ACCESS RIGHT  03/15/2021   MEMBRANE PEEL Right 02/24/2019   Procedure: Eula Flax;  Surgeon: [name], MD;  Location: [facility] OR;  Service: Ophthalmology;  Laterality: Right;   PARS PLANA VITRECTOMY Left 03/31/2019   Procedure: PARS PLANA VITRECTOMY WITH 25 GAUGE WITH MEMBRANE PEEL;  Surgeon: [name], MD;  Location: [facility] OR;  Service: Ophthalmology;  Laterality: Left;   PHOTOCOAGULATION WITH LASER Right 02/24/2019   Procedure: Photocoagulation With Laser;  Surgeon: [name], MD;  Location: [facility] OR;  Service: Ophthalmology;  Laterality: Right;   PHOTOCOAGULATION WITH LASER Left 03/31/2019   Procedure: Photocoagulation With Laser;  Surgeon: [name], MD;  Location: [facility] OR;  Service: Ophthalmology;  Laterality: Left;   RETINAL DETACHMENT SURGERY Left 03/31/2019   TRD  Repair - Dr. [name]   SILICON OIL REMOVAL Right 03/31/2019   Procedure: Silicon Oil Removal;  Surgeon: [name], MD;  Location: [facility] OR;  Service: Ophthalmology;  Laterality: Right;   STERNAL WOUND DEBRIDEMENT Left 08/27/2018   Procedure: Incision and DEBRIDEMENT Left Chest, Neck  and Mediastinum;  Surgeon: [name], MD;  Location: [facility] OR;  Service: Thoracic;  Laterality: Left;   STERNAL WOUND DEBRIDEMENT Left 08/30/2018   Procedure: WOUND VAC CHANGE, LEFT CHEST AND NECK, POSSIBLE DEBRIDEMENT;  Surgeon: [name], MD;  Location: [facility] OR;  Service: Thoracic;  Laterality: Left;   Family History Family History  Problem Relation Age of Onset   Hypertension Mother    Colon cancer Neg Hx     Social History Social History   Tobacco Use   Smoking status: Never   Smokeless tobacco: Never  Vaping Use   Vaping status: Never Used  Substance Use Topics   Alcohol use: No   Drug use: No   Allergies Tramadol  Review of Systems Review of Systems  All other systems reviewed and are negative.   Physical Exam Vital Signs  I have reviewed the triage vital signs BP (!) 145/92   Pulse (!) 101   Temp 98.9 F (37.2 C) (Oral)   Resp 18   Ht 5\' 11"  (1.803 m)   Wt 132 kg   SpO2 100%   BMI 40.59 kg/m  Physical Exam Vitals and nursing note reviewed.  Constitutional:      General: He is not in acute distress.    Appearance: Normal appearance.  HENT:     Mouth/Throat:     Mouth: Mucous membranes are moist.  Eyes:     Conjunctiva/sclera: Conjunctivae normal.  Cardiovascular:     Rate and Rhythm: Normal rate and regular rhythm.  Pulmonary:     Effort: Pulmonary effort is normal. No respiratory distress.     Breath sounds: Normal breath sounds.  Abdominal:     General: Abdomen is flat.     Palpations: Abdomen is soft.     Tenderness: There is no abdominal tenderness.  Musculoskeletal:     Comments: Mild warmth and erythema to both feet and lower legs.  Large  foul-smelling ulcer over the dorsum of the left foot around the toes 2 through 4.  Large ulcer on the right great toe with possibly some exposed bone but dry without purulent drainage  Skin:    General: Skin is warm and dry.     Capillary Refill: Capillary refill takes less than 2 seconds.  Neurological:     Mental Status: He is alert and oriented to person, place, and time. Mental status is at baseline.  Psychiatric:        Mood and Affect: Mood normal.        Behavior: Behavior normal.     ED Results and Treatments Labs (all labs ordered are listed, but only abnormal results are displayed) Labs Reviewed  CBC WITH DIFFERENTIAL/PLATELET - Abnormal; Notable for the following components:      Result Value   Hemoglobin 12.7 (*)    RDW 16.3 (*)    All other components within normal limits  BASIC METABOLIC PANEL - Abnormal; Notable for the following components:   Chloride 89 (*)    Glucose, Bld 157 (*)    BUN 21 (*)    Creatinine, Ser 6.11 (*)    GFR, Estimated 10 (*)    All other components within normal limits  SEDIMENTATION RATE - Abnormal; Notable for the following components:   Sed Rate 110 (*)    All other components within normal limits  CULTURE, BLOOD (ROUTINE X 2)  CULTURE, BLOOD (ROUTINE X 2)  LACTIC ACID, PLASMA  LACTIC ACID, PLASMA  C-REACTIVE PROTEIN  Radiology No results found.  Pertinent labs & imaging results that were available during my care of the patient were reviewed by me and considered in my medical decision making (see MDM for details).  Medications Ordered in ED Medications  metroNIDAZOLE (FLAGYL) IVPB 500 mg (has no administration in time range)  ceFEPIme (MAXIPIME) 2 g in sodium chloride 0.9 % 100 mL IVPB (0 g Intravenous Stopped 06/12/23 1458)                                                                                                                                      Procedures Procedures  (including critical care time)  Medical Decision Making / ED Course   MDM:  57 year old male presenting with wound.  Wound seems consistent with diabetic foot wound.  It does have some drainage and is foul-smelling.  Especially left wound.  His right toe wound appears more dry but does have a very large ulceration with possibly some exposed bone there.  He has had no fevers but does have a very elevated ESR.  On my interpretation of the x-ray he does have some signs of possible osteo-.  Discussed with Dr. Aundria Rud with orthopedic surgery, he recommends admission, transfer to [facility].  Recommends consult Dr. Lajoyce Corners once patient is there.  Will start on antibiotics and obtain blood cultures.  Discussed with Dr. Sherryll Burger who has admitted the patient.      Additional history obtained: -Additional history obtained from family -External records from outside source obtained and reviewed including: Chart review including previous notes, labs, imaging, consultation notes including prior er notes    Lab Tests: -I ordered, reviewed, and interpreted labs.   The pertinent results include:   Labs Reviewed  CBC WITH DIFFERENTIAL/PLATELET - Abnormal; Notable for the following components:      Result Value   Hemoglobin 12.7 (*)    RDW 16.3 (*)    All other components within normal limits  BASIC METABOLIC PANEL - Abnormal; Notable for the following components:   Chloride 89 (*)    Glucose, Bld 157 (*)    BUN 21 (*)    Creatinine, Ser 6.11 (*)    GFR, Estimated 10 (*)    All other components within normal limits  SEDIMENTATION RATE - Abnormal; Notable for the following components:   Sed Rate 110 (*)    All other components within normal limits  CULTURE, BLOOD (ROUTINE X 2)  CULTURE, BLOOD (ROUTINE X 2)  LACTIC ACID, PLASMA  LACTIC ACID, PLASMA  C-REACTIVE PROTEIN    Notable for signs of ESRD. Elevated ESR to 110   Imaging Studies  ordered: I ordered imaging studies including XR feet On my interpretation imaging demonstrates signs of osteomyelitis  I independently visualized and interpreted imaging. I agree with the radiologist interpretation   Medicines ordered and prescription drug management: Meds ordered this encounter  Medications   ceFEPIme (MAXIPIME) 2 g in  sodium chloride 0.9 % 100 mL IVPB   metroNIDAZOLE (FLAGYL) IVPB 500 mg    -I have reviewed the patients home medicines and have made adjustments as needed   Consultations Obtained: I requested consultation with the orthopedic surgeon ,  and discussed lab and imaging findings as well as pertinent plan - they recommend: admit to [facility]    Social Determinants of Health:  Diagnosis or treatment significantly limited by social determinants of health: obesity   Reevaluation: After the interventions noted above, I reevaluated the patient and found that their symptoms have improved  Co morbidities that complicate the patient evaluation  Past Medical History:  Diagnosis Date   Anemia    Cataract 07/02/2020   Diabetes mellitus    Type 2    Diabetic retinopathy of both eyes (HCC) 01/31/2019   [name] 01/2019   ESRD (end stage renal disease) (HCC)    [place] Frenisus   Herpes simplex virus (HSV) infection    OU   Hypertension    Hypertensive retinopathy    OU   Retinal detachment    OS   Sepsis due to Streptococcus, group B (HCC) 10/05/2018      Dispostion: Disposition decision including need for hospitalization was considered, and patient admitted to the hospital.    Final Clinical Impression(s) / ED Diagnoses Final diagnoses:  Diabetic foot infection (HCC)     This chart was dictated using voice recognition software.  Despite best efforts to proofread,  errors can occur which can change the documentation meaning.    Lonell Grandchild, MD 06/12/23 [phone]

## 2023-06-12 NOTE — ED Triage Notes (Signed)
Pt coming from dialysis today with complaints of blistering to bilat feet. Pt's right foot presents with two blisters on top of middle 3 toes as well as larger wound on bottom of greater toe. Pt's left foot presents with Large wound on top of foot and spans from greater tow to pinky. Pt has no other complaints. Pt is diabetic but is compliant with medications. Blistered began forming beginning of Oct.

## 2023-06-13 DIAGNOSIS — E11628 Type 2 diabetes mellitus with other skin complications: Secondary | ICD-10-CM | POA: Diagnosis not present

## 2023-06-13 DIAGNOSIS — L089 Local infection of the skin and subcutaneous tissue, unspecified: Secondary | ICD-10-CM | POA: Diagnosis not present

## 2023-06-13 LAB — GLUCOSE, CAPILLARY
Glucose-Capillary: 190 mg/dL — ABNORMAL HIGH (ref 70–99)
Glucose-Capillary: 164 mg/dL — ABNORMAL HIGH (ref 70–99)
Glucose-Capillary: 146 mg/dL — ABNORMAL HIGH (ref 70–99)
Glucose-Capillary: 124 mg/dL — ABNORMAL HIGH (ref 70–99)

## 2023-06-13 LAB — CBC
HCT: 34.2 % — ABNORMAL LOW (ref 39.0–52.0)
Hemoglobin: 11.2 g/dL — ABNORMAL LOW (ref 13.0–17.0)
MCH: 28.7 pg (ref 26.0–34.0)
MCHC: 32.7 g/dL (ref 30.0–36.0)
MCV: 87.7 fL (ref 80.0–100.0)
Platelets: 196 10*3/uL (ref 150–400)
RBC: 3.9 MIL/uL — ABNORMAL LOW (ref 4.22–5.81)
RDW: 16.2 % — ABNORMAL HIGH (ref 11.5–15.5)
WBC: 7.2 10*3/uL (ref 4.0–10.5)
nRBC: 0 % (ref 0.0–0.2)

## 2023-06-13 LAB — BASIC METABOLIC PANEL
Anion gap: 9 (ref 5–15)
BUN: 36 mg/dL — ABNORMAL HIGH (ref 6–20)
CO2: 30 mmol/L (ref 22–32)
Calcium: 7.8 mg/dL — ABNORMAL LOW (ref 8.9–10.3)
Chloride: 91 mmol/L — ABNORMAL LOW (ref 98–111)
Creatinine, Ser: 8.76 mg/dL — ABNORMAL HIGH (ref 0.61–1.24)
GFR, Estimated: 6 mL/min — ABNORMAL LOW (ref >60)
Glucose, Bld: 205 mg/dL — ABNORMAL HIGH (ref 70–99)
Potassium: 4.1 mmol/L (ref 3.5–5.1)
Sodium: 130 mmol/L — ABNORMAL LOW (ref 135–145)

## 2023-06-13 LAB — MAGNESIUM: Magnesium: 2.3 mg/dL (ref 1.7–2.4)

## 2023-06-13 MED ORDER — CHLORHEXIDINE GLUCONATE CLOTH 2 % EX PADS
6.0000 | MEDICATED_PAD | Freq: Every day | CUTANEOUS | Status: DC
  Administered 2023-06-13 – 2023-06-15 (×3): 6 via TOPICAL

## 2023-06-13 NOTE — Progress Notes (Signed)
PROGRESS NOTE    Randy Oneal  ZOX:[id number] DOB: 05/17/1966 DOA: 06/12/2023 PCP: [name], MD    Brief Narrative:  Randy Oneal is a 57 y.o. male with medical history significant for ESRD on HD MWF, hypertension, insulin-dependent type 2 diabetes, retinopathy, and neuropathy, who was told to come to the ED for further evaluation of his bilateral foot wounds that have been ongoing since August of this year.  He has been managing them at home with dressing changes and has not seen a podiatrist over 1 year as he was discharged from podiatry follow-up.  He states that he does not have any pain and is able to ambulate on both feet, but most concerning was the fact that he developed blisters on the dorsum of both feet approximately 1 week ago.  He states that his blood glucoses usually adequately controlled and he has a Dexcom and monitors this routinely.  He states that his recent A1c was 8.7%.  He has completed hemodialysis on the day of admission.    Assessment and Plan: Worsening diabetic foot infections with concern for cellulitis/osteomyelitis -Follow blood cultures -Continue vancomycin and cefepime empirically -Appreciate further evaluation at [facility] by Dr. Lajoyce Corners-- await recommendations -Robust pulses noted on dorsalis pedis bilaterally -continue wound care   Type 2 diabetes-insulin-dependent with mild hyperglycemia -Continue carb modified diet -SSI   ESRD on HD MWF -Completed hemodialysis today and will not need another session until 11/4 -updated nephrology   Hypertension -Continue to monitor, currently controlled and does not appear to be on home medications for this   Morbid obesity Estimated body mass index is 40.59 kg/m as calculated from the following:   Height as of this encounter: 5\' 11"  (1.803 m).   Weight as of this encounter: 132 kg.      DVT prophylaxis: heparin injection 5,000 Units Start: 06/12/23 2200    Code Status: Full Code Family Communication:    Disposition Plan:  Level of care: Med-Surg Status is: Inpatient     Consultants:  Duda   Subjective: No pain-- feels well  Objective: Vitals:   06/13/23 0052 06/13/23 0544 06/13/23 0700 06/13/23 1120  BP: 134/78 124/80 116/71 99/69  Pulse: 85 85 85 98  Resp: 18 18 18 20   Temp: 99.5 F (37.5 C) 98.8 F (37.1 C) 98.9 F (37.2 C) 98.5 F (36.9 C)  TempSrc: Oral Oral Oral Oral  SpO2: 98% 98% 100% 100%  Weight:      Height:        Intake/Output Summary (Last 24 hours) at 06/13/2023 1123 Last data filed at 06/12/2023 1459 Gross per 24 hour  Intake 0.4 ml  Output --  Net 0.4 ml   Filed Weights   06/12/23 1146  Weight: 132 kg    Examination:   General: Appearance:    Obese male in no acute distress     Lungs:     respirations unlabored  Heart:    Normal heart rate. Normal rhythm. No murmurs, rubs, or gallops.    MS:   All extremities are intact.    Neurologic:   Awake, alert       Data Reviewed: I have personally reviewed following labs and imaging studies  CBC: Recent Labs  Lab 06/12/23 1230 06/13/23 0402  WBC 5.2 7.2  NEUTROABS 3.6  --   HGB 12.7* 11.2*  HCT 39.2 34.2*  MCV 89.1 87.7  PLT 197 196   Basic Metabolic Panel: Recent Labs  Lab 06/12/23 1230 06/13/23  0402  NA 135 130*  K 3.6 4.1  CL 89* 91*  CO2 32 30  GLUCOSE 157* 205*  BUN 21* 36*  CREATININE 6.11* 8.76*  CALCIUM 9.0 7.8*  MG  --  2.3   GFR: Estimated Creatinine Clearance: 12.9 mL/min (A) (by C-G formula based on SCr of 8.76 mg/dL (H)). Liver Function Tests: No results for input(s): "AST", "ALT", "ALKPHOS", "BILITOT", "PROT", "ALBUMIN" in the last 168 hours. No results for input(s): "LIPASE", "AMYLASE" in the last 168 hours. No results for input(s): "AMMONIA" in the last 168 hours. Coagulation Profile: No results for input(s): "INR", "PROTIME" in the last 168 hours. Cardiac Enzymes: No results for input(s): "CKTOTAL", "CKMB", "CKMBINDEX", "TROPONINI" in the last  168 hours. BNP (last 3 results) No results for input(s): "PROBNP" in the last 8760 hours. HbA1C: No results for input(s): "HGBA1C" in the last 72 hours. CBG: Recent Labs  Lab 06/12/23 1653 06/12/23 2020 06/13/23 0708  GLUCAP 149* 213* 190*   Lipid Profile: No results for input(s): "CHOL", "HDL", "LDLCALC", "TRIG", "CHOLHDL", "LDLDIRECT" in the last 72 hours. Thyroid Function Tests: No results for input(s): "TSH", "T4TOTAL", "FREET4", "T3FREE", "THYROIDAB" in the last 72 hours. Anemia Panel: No results for input(s): "VITAMINB12", "FOLATE", "FERRITIN", "TIBC", "IRON", "RETICCTPCT" in the last 72 hours. Sepsis Labs: Recent Labs  Lab 06/12/23 1230 06/12/23 1409  LATICACIDVEN 1.8 1.4    Recent Results (from the past 240 hour(s))  Culture, blood (routine x 2)     Status: None (Preliminary result)   Collection Time: 06/12/23 12:30 PM   Specimen: BLOOD  Result Value Ref Range Status   Specimen Description BLOOD RIGHT ANTECUBITAL  Final   Special Requests   Final    BOTTLES DRAWN AEROBIC AND ANAEROBIC Blood Culture results may not be optimal due to an excessive volume of blood received in culture bottles   Culture   Final    NO GROWTH < 24 HOURS Performed at [facility], [address] [place], [place] [address]    Report Status PENDING  Incomplete  Culture, blood (routine x 2)     Status: None (Preliminary result)   Collection Time: 06/12/23  2:39 PM   Specimen: BLOOD  Result Value Ref Range Status   Specimen Description BLOOD RIGHT ANTECUBITAL  Final   Special Requests   Final    BOTTLES DRAWN AEROBIC AND ANAEROBIC Blood Culture results may not be optimal due to an excessive volume of blood received in culture bottles   Culture   Final    NO GROWTH < 24 HOURS Performed at [facility], [address], [place], [place] [address]    Report Status PENDING  Incomplete         Radiology Studies: DG Foot Complete Right  Result Date: 06/12/2023 CLINICAL DATA:  To  blisters on top of middle 3 toes and larger wound on the bottom of the great toe. EXAM: RIGHT FOOT COMPLETE - 3+ VIEW COMPARISON:  Foot radiographs 08/07/2021, MR foot 09/06/2021 FINDINGS: There is a large soft tissue defect along the medial aspect of the great toe. There is no convincing osseous erosion or destruction of the underlying bone. There is no acute fracture or dislocation. Heterotopic bone along the medial margin of the first metatarsal may reflect sequela of prior infection or trauma. Hallux valgus deformity is noted. Bony alignment is otherwise normal. The joint spaces are overall preserved, without significant osteoarthritic change. IMPRESSION: Large soft tissue defect along the medial aspect of the great toe without convincing  osseous erosion or destruction of the underlying bone. Electronically Signed   By: Lesia Hausen M.D.   On: 06/12/2023 16:34   DG Foot Complete Left  Result Date: 06/12/2023 CLINICAL DATA:  Wound on dorsum of foot spanning from great toe to pinky toe. EXAM: LEFT FOOT - COMPLETE 3+ VIEW COMPARISON:  None Available. FINDINGS: There is no acute fracture or dislocation. Bony alignment is normal. There is mild narrowing of the great toe MTP joint. There is mild inferior calcaneal spurring. There is lucency in the soft tissues of the great toe presumably corresponding to a soft tissue wound. There is no underlying osseous erosion or destruction to suggest advanced osteomyelitis. IMPRESSION: Lucency in the soft tissues of the great toe presumably corresponding to a soft tissue wound without definite evidence of underlying osteomyelitis. Electronically Signed   By: Lesia Hausen M.D.   On: 06/12/2023 16:30        Scheduled Meds:  Chlorhexidine Gluconate Cloth  6 each Topical Daily   cinacalcet  60 mg Oral Q M,W,F   heparin  5,000 Units Subcutaneous Q8H   insulin aspart  0-5 Units Subcutaneous QHS   insulin aspart  0-9 Units Subcutaneous TID WC   insulin aspart  9 Units  Subcutaneous TID WC   insulin glargine-yfgn  21 Units Subcutaneous QHS   ketorolac  1 drop Both Eyes QID   prednisoLONE acetate  1 drop Both Eyes QID   sucroferric oxyhydroxide  1,000 mg Oral TID WC   Continuous Infusions:  [START ON 06/15/2023] ceFEPime (MAXIPIME) IV     [START ON 06/15/2023] vancomycin       LOS: 1 day    Time spent: 45 minutes spent on chart review, discussion with nursing staff, consultants, updating family and interview/physical exam; more than 50% of that time was spent in counseling and/or coordination of care.    Joseph Art, DO [facility] Available via Epic secure chat 7am-7pm After these hours, please refer to coverage provider listed on [email or web address] 06/13/2023, 11:23 AM

## 2023-06-13 NOTE — Plan of Care (Signed)

## 2023-06-14 ENCOUNTER — Inpatient Hospital Stay (HOSPITAL_COMMUNITY): Payer: Medicare Other

## 2023-06-14 DIAGNOSIS — E11628 Type 2 diabetes mellitus with other skin complications: Secondary | ICD-10-CM | POA: Diagnosis not present

## 2023-06-14 DIAGNOSIS — L089 Local infection of the skin and subcutaneous tissue, unspecified: Secondary | ICD-10-CM | POA: Diagnosis not present

## 2023-06-14 LAB — HEPATITIS B SURFACE ANTIGEN: Hepatitis B Surface Ag: NONREACTIVE

## 2023-06-14 LAB — PHOSPHORUS: Phosphorus: 2.4 mg/dL — ABNORMAL LOW (ref 2.5–4.6)

## 2023-06-14 LAB — GLUCOSE, CAPILLARY
Glucose-Capillary: 108 mg/dL — ABNORMAL HIGH (ref 70–99)
Glucose-Capillary: 165 mg/dL — ABNORMAL HIGH (ref 70–99)
Glucose-Capillary: 143 mg/dL — ABNORMAL HIGH (ref 70–99)
Glucose-Capillary: 179 mg/dL — ABNORMAL HIGH (ref 70–99)

## 2023-06-14 LAB — ALBUMIN: Albumin: 3.3 g/dL — ABNORMAL LOW (ref 3.5–5.0)

## 2023-06-14 MED ORDER — CHLORHEXIDINE GLUCONATE CLOTH 2 % EX PADS
6.0000 | MEDICATED_PAD | Freq: Every day | CUTANEOUS | Status: DC
  Administered 2023-06-15: 6 via TOPICAL

## 2023-06-14 MED ORDER — CALCITRIOL 0.5 MCG PO CAPS
0.5000 ug | ORAL_CAPSULE | ORAL | Status: DC
  Filled 2023-06-14: qty 1

## 2023-06-14 MED ORDER — GADOBUTROL 1 MMOL/ML IV SOLN
7.0000 mL | Freq: Once | INTRAVENOUS | Status: AC | PRN
Start: 2023-06-14 — End: 2023-06-14
  Administered 2023-06-14: 7 mL via INTRAVENOUS

## 2023-06-14 NOTE — Progress Notes (Signed)
PROGRESS NOTE    Randy Oneal  MVH:[id number] DOB: 1966-06-18 DOA: 06/12/2023 PCP: [name], MD    Brief Narrative:  Randy Oneal is a 57 y.o. male with medical history significant for ESRD on HD MWF, hypertension, insulin-dependent type 2 diabetes, retinopathy, and neuropathy, who was told to come to the ED for further evaluation of his bilateral foot wounds that have been ongoing since August of this year.  He has been managing them at home with dressing changes and has not seen a podiatrist over 1 year as he was discharged from podiatry follow-up.  He states that he does not have any pain and is able to ambulate on both feet, but most concerning was the fact that he developed blisters on the dorsum of both feet approximately 1 week ago.  He states that his blood glucoses usually adequately controlled and he has a Dexcom and monitors this routinely.  He states that his recent A1c was 8.7%.  He has completed hemodialysis on the day of admission.    Assessment and Plan: Worsening diabetic foot infections with concern for cellulitis/osteomyelitis -Follow blood cultures -Continue vancomycin and cefepime empirically -Appreciate further evaluation at [facility] by Dr. Vernelle Emerald get ABIs and MRIs -continue wound care   Type 2 diabetes-insulin-dependent with mild hyperglycemia -Continue carb modified diet -SSI   ESRD on HD MWF -Completed hemodialysis today and will not need another session until 11/4 -updated nephrology   Hypertension -Continue to monitor, currently controlled and does not appear to be on home medications for this   Morbid obesity Estimated body mass index is 40.59 kg/m as calculated from the following:   Height as of this encounter: 5\' 11"  (1.803 m).   Weight as of this encounter: 132 kg.      DVT prophylaxis: heparin injection 5,000 Units Start: 06/12/23 2200    Code Status: Full Code   Disposition Plan:  Level of care: Med-Surg Status is: Inpatient      Consultants:  Lajoyce Corners   Subjective: Continues with no pain  Objective: Vitals:   06/13/23 1631 06/13/23 2012 06/14/23 0417 06/14/23 0800  BP: (!) 151/133 97/61 94/68 134/83  Pulse: 97 93 74 89  Resp: 20 16 16 15   Temp: 100.1 F (37.8 C) 99.5 F (37.5 C) 98.5 F (36.9 C) 98.8 F (37.1 C)  TempSrc: Oral Oral Oral Oral  SpO2: 98% 98% 100% 100%  Weight:      Height:       No intake or output data in the 24 hours ending 06/14/23 1027  Filed Weights   06/12/23 1146  Weight: 132 kg    Examination:   General: Appearance:    Obese male in no acute distress     Lungs:     respirations unlabored  Heart:    Normal heart rate.    MS:   All extremities are intact.    Neurologic:   Awake, alert       Data Reviewed: I have personally reviewed following labs and imaging studies  CBC: Recent Labs  Lab 06/12/23 1230 06/13/23 0402  WBC 5.2 7.2  NEUTROABS 3.6  --   HGB 12.7* 11.2*  HCT 39.2 34.2*  MCV 89.1 87.7  PLT 197 196   Basic Metabolic Panel: Recent Labs  Lab 06/12/23 1230 06/13/23 0402  NA 135 130*  K 3.6 4.1  CL 89* 91*  CO2 32 30  GLUCOSE 157* 205*  BUN 21* 36*  CREATININE 6.11* 8.76*  CALCIUM 9.0  7.8*  MG  --  2.3   GFR: Estimated Creatinine Clearance: 12.9 mL/min (A) (by C-G formula based on SCr of 8.76 mg/dL (H)). Liver Function Tests: No results for input(s): "AST", "ALT", "ALKPHOS", "BILITOT", "PROT", "ALBUMIN" in the last 168 hours. No results for input(s): "LIPASE", "AMYLASE" in the last 168 hours. No results for input(s): "AMMONIA" in the last 168 hours. Coagulation Profile: No results for input(s): "INR", "PROTIME" in the last 168 hours. Cardiac Enzymes: No results for input(s): "CKTOTAL", "CKMB", "CKMBINDEX", "TROPONINI" in the last 168 hours. BNP (last 3 results) No results for input(s): "PROBNP" in the last 8760 hours. HbA1C: No results for input(s): "HGBA1C" in the last 72 hours. CBG: Recent Labs  Lab 06/13/23 0708  06/13/23 1123 06/13/23 1629 06/13/23 2015 06/14/23 0800  GLUCAP 190* 164* 146* 124* 108*   Lipid Profile: No results for input(s): "CHOL", "HDL", "LDLCALC", "TRIG", "CHOLHDL", "LDLDIRECT" in the last 72 hours. Thyroid Function Tests: No results for input(s): "TSH", "T4TOTAL", "FREET4", "T3FREE", "THYROIDAB" in the last 72 hours. Anemia Panel: No results for input(s): "VITAMINB12", "FOLATE", "FERRITIN", "TIBC", "IRON", "RETICCTPCT" in the last 72 hours. Sepsis Labs: Recent Labs  Lab 06/12/23 1230 06/12/23 1409  LATICACIDVEN 1.8 1.4    Recent Results (from the past 240 hour(s))  Culture, blood (routine x 2)     Status: None (Preliminary result)   Collection Time: 06/12/23 12:30 PM   Specimen: BLOOD  Result Value Ref Range Status   Specimen Description BLOOD RIGHT ANTECUBITAL  Final   Special Requests   Final    BOTTLES DRAWN AEROBIC AND ANAEROBIC Blood Culture results may not be optimal due to an excessive volume of blood received in culture bottles   Culture   Final    NO GROWTH 2 DAYS Performed at [facility], [address], [place], [place] [address]    Report Status PENDING  Incomplete  Culture, blood (routine x 2)     Status: None (Preliminary result)   Collection Time: 06/12/23  2:39 PM   Specimen: BLOOD  Result Value Ref Range Status   Specimen Description BLOOD RIGHT ANTECUBITAL  Final   Special Requests   Final    BOTTLES DRAWN AEROBIC AND ANAEROBIC Blood Culture results may not be optimal due to an excessive volume of blood received in culture bottles   Culture   Final    NO GROWTH 2 DAYS Performed at [facility], [address], [place], [place] [address]    Report Status PENDING  Incomplete         Radiology Studies: DG Foot Complete Right  Result Date: 06/12/2023 CLINICAL DATA:  To blisters on top of middle 3 toes and larger wound on the bottom of the great toe. EXAM: RIGHT FOOT COMPLETE - 3+ VIEW COMPARISON:  Foot radiographs 08/07/2021, MR foot  09/06/2021 FINDINGS: There is a large soft tissue defect along the medial aspect of the great toe. There is no convincing osseous erosion or destruction of the underlying bone. There is no acute fracture or dislocation. Heterotopic bone along the medial margin of the first metatarsal may reflect sequela of prior infection or trauma. Hallux valgus deformity is noted. Bony alignment is otherwise normal. The joint spaces are overall preserved, without significant osteoarthritic change. IMPRESSION: Large soft tissue defect along the medial aspect of the great toe without convincing osseous erosion or destruction of the underlying bone. Electronically Signed   By: Lesia Hausen M.D.   On: 06/12/2023 16:34   DG Foot Complete Left  Result Date: 06/12/2023 CLINICAL DATA:  Wound on dorsum of foot spanning from great toe to pinky toe. EXAM: LEFT FOOT - COMPLETE 3+ VIEW COMPARISON:  None Available. FINDINGS: There is no acute fracture or dislocation. Bony alignment is normal. There is mild narrowing of the great toe MTP joint. There is mild inferior calcaneal spurring. There is lucency in the soft tissues of the great toe presumably corresponding to a soft tissue wound. There is no underlying osseous erosion or destruction to suggest advanced osteomyelitis. IMPRESSION: Lucency in the soft tissues of the great toe presumably corresponding to a soft tissue wound without definite evidence of underlying osteomyelitis. Electronically Signed   By: Lesia Hausen M.D.   On: 06/12/2023 16:30        Scheduled Meds:  Chlorhexidine Gluconate Cloth  6 each Topical Daily   cinacalcet  60 mg Oral Q M,W,F   heparin  5,000 Units Subcutaneous Q8H   insulin aspart  0-5 Units Subcutaneous QHS   insulin aspart  0-9 Units Subcutaneous TID WC   insulin aspart  9 Units Subcutaneous TID WC   insulin glargine-yfgn  21 Units Subcutaneous QHS   ketorolac  1 drop Both Eyes QID   prednisoLONE acetate  1 drop Both Eyes QID   sucroferric  oxyhydroxide  1,000 mg Oral TID WC   Continuous Infusions:  [START ON 06/15/2023] ceFEPime (MAXIPIME) IV     [START ON 06/15/2023] vancomycin       LOS: 2 days    Time spent: 45 minutes spent on chart review, discussion with nursing staff, consultants, updating family and interview/physical exam; more than 50% of that time was spent in counseling and/or coordination of care.    Joseph Art, DO [facility] Available via Epic secure chat 7am-7pm After these hours, please refer to coverage provider listed on [email or web address] 06/14/2023, 10:27 AM

## 2023-06-14 NOTE — Consult Note (Signed)
Renal Service Consult Note [facility]  Randy Oneal 06/14/2023 Maree Krabbe, MD Requesting Physician: Dr. Benjamine Mola  Reason for Consult: ESRD pt w/ diabetic foot infection HPI: The patient is a 57 y.o. year-old w/ PMH as below who presented to ED 11/01 c/o blisters on bilat feet. Concern was for cellulitis / osteomyelitis. Pt was admitted and started on IV vanc/ cefepime. Blood cx's sent. Ortho consulted. We are asked to see for dialysis.   Pt seen in room. In good spirits, watching TV.  No recent HD issues.   ROS - denies CP, no joint pain, no HA, no blurry vision, no rash, no diarrhea, no nausea/ vomiting   Past Medical History  Past Medical History:  Diagnosis Date   Anemia    Cataract 07/02/2020   Diabetes mellitus    Type 2    Diabetic retinopathy of both eyes (HCC) 01/31/2019   [name] 01/2019   ESRD (end stage renal disease) (HCC)    [place] Frenisus   Herpes simplex virus (HSV) infection    OU   Hypertension    Hypertensive retinopathy    OU   Retinal detachment    OS   Sepsis due to Streptococcus, group B (HCC) 10/05/2018   Past Surgical History  Past Surgical History:  Procedure Laterality Date   25 GAUGE PARS PLANA VITRECTOMY WITH 20 GAUGE MVR PORT FOR MACULAR HOLE Right 02/24/2019   Procedure: 25 GAUGE PARS PLANA VITRECTOMY WITH 20 GAUGE MVR PORT FOR MACULAR HOLE;  Surgeon: [name], MD;  Location: [facility] OR;  Service: Ophthalmology;  Laterality: Right;   APPLICATION OF WOUND VAC Left 08/27/2018   Procedure: APPLICATION OF WOUND VAC, left neck;  Surgeon: [name], MD;  Location: [facility] OR;  Service: Thoracic;  Laterality: Left;   APPLICATION OF WOUND VAC Left 08/30/2018   Procedure: APPLICATION OF WOUND VAC;  Surgeon: [name], MD;  Location: [facility] OR;  Service: Thoracic;  Laterality: Left;   AV FISTULA PLACEMENT Left 02/15/2020   Procedure: LEFT ARM ARTERIOVENOUS (AV) FISTULA CREATION;  Surgeon: [name], MD;   Location: [facility] OR;  Service: Vascular;  Laterality: Left;   CATARACT EXTRACTION Bilateral    CATARACT EXTRACTION W/ INTRAOCULAR LENS  IMPLANT, BILATERAL     COLONOSCOPY  06/24/2011   Procedure: COLONOSCOPY;  Surgeon: [name], MD;  Location: [facility] ENDO SUITE;  Service: Endoscopy;  Laterality: N/A;  8:30 AM   EYE SURGERY     GAS/FLUID EXCHANGE Left 03/31/2019   Procedure: Gas/Fluid Exchange;  Surgeon: [name], MD;  Location: [facility] OR;  Service: Ophthalmology;  Laterality: Left;   INJECTION OF SILICONE OIL  02/24/2019   Procedure: Injection Of Silicone Oil;  Surgeon: [name], MD;  Location: [facility] OR;  Service: Ophthalmology;;   INSERTION OF DIALYSIS CATHETER Right 12/05/2019   Procedure: INSERTION OF DIALYSIS CATHETER RIGHT SUBCLAVIAN;  Surgeon: [name], MD;  Location: [facility] ORS;  Service: General;  Laterality: Right;   INSERTION OF DIALYSIS CATHETER Right 12/07/2019   Procedure: Minor Dialysis catheter in place- need to reposition/ adjust catheter to help with flows;  Surgeon: [name], MD;  Location: [facility] ORS;  Service: General;  Laterality: Right;  procedure room case with 1% lidocaine, full sterile drape,  towels, full gown, large chloraprep, 4-0 Monocryl and dermabond    INSERTION OF DIALYSIS CATHETER Right 12/08/2019   Procedure: INSERTION OF DIALYSIS CATHETER EXCHANGE;  Surgeon: [name], MD;  Location: [facility] ORS;  Service: General;  Laterality: Right;   IR ANGIOGRAM EXTREMITY RIGHT  03/15/2021   IR RADIOLOGIST EVAL & MGMT  02/07/2021   IR RADIOLOGIST EVAL & MGMT  05/09/2021   IR US GUIDE VASC ACCESS RIGHT  03/15/2021   MEMBRANE PEEL Right 02/24/2019   Procedure: [place] Flax;  Surgeon: [name], MD;  Location: [facility] OR;  Service: Ophthalmology;  Laterality: Right;   PARS PLANA VITRECTOMY Left 03/31/2019   Procedure: PARS PLANA VITRECTOMY WITH 25 GAUGE WITH MEMBRANE PEEL;  Surgeon: [name], MD;  Location: [facility] OR;  Service: Ophthalmology;  Laterality: Left;    PHOTOCOAGULATION WITH LASER Right 02/24/2019   Procedure: Photocoagulation With Laser;  Surgeon: [name], MD;  Location: [facility] OR;  Service: Ophthalmology;  Laterality: Right;   PHOTOCOAGULATION WITH LASER Left 03/31/2019   Procedure: Photocoagulation With Laser;  Surgeon: [name], MD;  Location: [facility] OR;  Service: Ophthalmology;  Laterality: Left;   RETINAL DETACHMENT SURGERY Left 03/31/2019   TRD Repair - Dr. [name]   SILICON OIL REMOVAL Right 03/31/2019   Procedure: Silicon Oil Removal;  Surgeon: [name], MD;  Location: [facility] OR;  Service: Ophthalmology;  Laterality: Right;   STERNAL WOUND DEBRIDEMENT Left 08/27/2018   Procedure: Incision and DEBRIDEMENT Left Chest, Neck and Mediastinum;  Surgeon: [name], MD;  Location: [facility] OR;  Service: Thoracic;  Laterality: Left;   STERNAL WOUND DEBRIDEMENT Left 08/30/2018   Procedure: WOUND VAC CHANGE, LEFT CHEST AND NECK, POSSIBLE DEBRIDEMENT;  Surgeon: [name], MD;  Location: [facility] OR;  Service: Thoracic;  Laterality: Left;   Family History  Family History  Problem Relation Age of Onset   Hypertension Mother    Colon cancer Neg Hx    Social History  reports that he has never smoked. He has never used smokeless tobacco. He reports that he does not drink alcohol and does not use drugs. Allergies  Allergies  Allergen Reactions   Tramadol Nausea And Vomiting   Home medications Prior to Admission medications   Medication Sig Start Date End Date Taking? Authorizing Provider  acetaminophen (TYLENOL) 500 MG tablet Take 1,000 mg by mouth every 6 (six) hours as needed for moderate pain or headache.   Yes [provider]  Bromfenac Sodium (PROLENSA) 0.07 % SOLN Place 1 drop into both eyes 4 (four) times daily. 05/26/23  Yes [name], MD  cinacalcet [facility]) 60 MG tablet Take 60 mg by mouth every Monday, Wednesday, and Friday.   Yes [provider]  insulin aspart (NOVOLOG FLEXPEN) 100 UNIT/ML FlexPen 7  units with breakfast and lunch, and 8 units with supper CF NovoLog(BG -130/40)  Max daily 45 units Patient taking differently: Inject 9-10 Units into the skin 3 (three) times daily with meals. 01/13/23  Yes Shamleffer, Konrad Dolores, MD  insulin degludec (TRESIBA FLEXTOUCH) 100 UNIT/ML FlexTouch Pen Inject 22 Units into the skin at bedtime. Patient taking differently: Inject 21 Units into the skin at bedtime. 01/13/23  Yes Shamleffer, Konrad Dolores, MD  prednisoLONE acetate (PRED FORTE) 1 % ophthalmic suspension PLACE (1) DROP INTO BOTH EYES FOUR TIMES DAILY 09/18/22  Yes [name], MD  sucroferric oxyhydroxide (VELPHORO) 500 MG chewable tablet Chew 1,000 mg by mouth 3 (three) times daily with meals.   Yes [provider]     Vitals:   06/13/23 1631 06/13/23 2012 06/14/23 0417 06/14/23 0800  BP: (!) 151/133 97/61 94/68 134/83  Pulse: 97 93 74 89  Resp: 20 16 16 15   Temp: 100.1  F (37.8 C) 99.5 F (37.5 C) 98.5 F (36.9 C) 98.8 F (37.1 C)  TempSrc: Oral Oral Oral Oral  SpO2: 98% 98% 100% 100%  Weight:      Height:       Exam Gen alert, no distress No rash, cyanosis or gangrene Sclera anicteric, throat clear  No jvd or bruits Chest clear bilat to bases, no rales/ wheezing RRR no MRG Abd soft ntnd no mass or ascites +bs GU nl male MS no joint effusions or deformity Ext trace LE edema, no wounds or ulcers Neuro is alert, Ox 3 , nf    LUA AVF +bruit      Renal-related home meds: - sensipar 60 mwf - velphoro 1 gm ac tid - others: insulin aspart/ degludec    OP HD: MWF [facility] 4h  450/500   86.9kg   2/2.5 bath  LUA AVF   Heparin 5000 - rocaltrol 0.5 mcg  - no esa - last OP HD 11/1, post wt 86.9kg, good compliance - last Hb 11.4 on 10/28   Assessment/ Plan: Bilat diabetic foot infections - on vanc/ cefepime, further w/u pending ESKD - on HD MWF. Has not missed. HD Monday.  BP - BP's wnl, not on BP meds at home Volume - 3-5 kg wt gains, getting to  dry wt. No vol excess on exam.  Anemia of eskd - Hb 11- 13 here, not on esa at OP unit. Follow.  MBD ckd - Ca in range, add on alb/ phos. Cont po vdra and binders.  DM2 - on insulin      Vinson Moselle  MD [facility] 06/14/2023, 2:14 PM  Recent Labs  Lab 06/12/23 1230 06/13/23 0402  HGB 12.7* 11.2*  CALCIUM 9.0 7.8*  CREATININE 6.11* 8.76*  K 3.6 4.1   Inpatient medications:  Chlorhexidine Gluconate Cloth  6 each Topical Daily   cinacalcet  60 mg Oral Q M,W,F   heparin  5,000 Units Subcutaneous Q8H   insulin aspart  0-5 Units Subcutaneous QHS   insulin aspart  0-9 Units Subcutaneous TID WC   insulin aspart  9 Units Subcutaneous TID WC   insulin glargine-yfgn  21 Units Subcutaneous QHS   ketorolac  1 drop Both Eyes QID   prednisoLONE acetate  1 drop Both Eyes QID   sucroferric oxyhydroxide  1,000 mg Oral TID WC    [START ON 06/15/2023] ceFEPime (MAXIPIME) IV     [START ON 06/15/2023] vancomycin     acetaminophen **OR** acetaminophen, ondansetron **OR** ondansetron (ZOFRAN) IV

## 2023-06-14 NOTE — Consult Note (Signed)
WOC Nurse Consult Note: Reason for Consult: bilateral foot wounds History of ESRD, DM, HTN. Foot wounds present over a year, no follow up. New onset of blistering to the left foot Xrays negative  Wound type: Neuropathic/ PAD Pressure Injury POA: NA Measurement: see nursing flow sheets Wound bed: Right great toe 100% non viable tissue, circular ulceration  Left dorsal foot with blistering of the toes, black tissue noted on the 2nd toes and the 4th toe.  Drainage (amount, consistency, odor) no drainage documented  Periwound: intact  Dressing procedure/placement/frequency: Paint affected areas with betadine, allow to air dry Dry dressing if patient desires.  Perform daily  Pending evaluation per orthopedics   Re consult if needed, will not follow at this time. Thanks  Lanaysia Fritchman [employer], RN,CWOCN, CNS, CWON-[facility] [phone])

## 2023-06-15 ENCOUNTER — Other Ambulatory Visit (HOSPITAL_COMMUNITY): Payer: Self-pay

## 2023-06-15 DIAGNOSIS — N186 End stage renal disease: Secondary | ICD-10-CM

## 2023-06-15 DIAGNOSIS — Z992 Dependence on renal dialysis: Secondary | ICD-10-CM | POA: Diagnosis not present

## 2023-06-15 DIAGNOSIS — L089 Local infection of the skin and subcutaneous tissue, unspecified: Secondary | ICD-10-CM | POA: Diagnosis not present

## 2023-06-15 DIAGNOSIS — E11628 Type 2 diabetes mellitus with other skin complications: Secondary | ICD-10-CM | POA: Diagnosis not present

## 2023-06-15 LAB — CBC
HCT: 36.4 % — ABNORMAL LOW (ref 39.0–52.0)
Hemoglobin: 12.1 g/dL — ABNORMAL LOW (ref 13.0–17.0)
MCH: 28.9 pg (ref 26.0–34.0)
MCHC: 33.2 g/dL (ref 30.0–36.0)
MCV: 86.9 fL (ref 80.0–100.0)
Platelets: 220 10*3/uL (ref 150–400)
RBC: 4.19 MIL/uL — ABNORMAL LOW (ref 4.22–5.81)
RDW: 16.1 % — ABNORMAL HIGH (ref 11.5–15.5)
WBC: 6 10*3/uL (ref 4.0–10.5)
nRBC: 0 % (ref 0.0–0.2)

## 2023-06-15 LAB — BASIC METABOLIC PANEL
Anion gap: 18 — ABNORMAL HIGH (ref 5–15)
BUN: 82 mg/dL — ABNORMAL HIGH (ref 6–20)
CO2: 23 mmol/L (ref 22–32)
Calcium: 8.8 mg/dL — ABNORMAL LOW (ref 8.9–10.3)
Chloride: 92 mmol/L — ABNORMAL LOW (ref 98–111)
Creatinine, Ser: 14.04 mg/dL — ABNORMAL HIGH (ref 0.61–1.24)
GFR, Estimated: 4 mL/min — ABNORMAL LOW (ref >60)
Glucose, Bld: 126 mg/dL — ABNORMAL HIGH (ref 70–99)
Potassium: 4 mmol/L (ref 3.5–5.1)
Sodium: 133 mmol/L — ABNORMAL LOW (ref 135–145)

## 2023-06-15 LAB — GLUCOSE, CAPILLARY
Glucose-Capillary: 114 mg/dL — ABNORMAL HIGH (ref 70–99)
Glucose-Capillary: 115 mg/dL — ABNORMAL HIGH (ref 70–99)
Glucose-Capillary: 138 mg/dL — ABNORMAL HIGH (ref 70–99)

## 2023-06-15 MED ORDER — PENTAFLUOROPROP-TETRAFLUOROETH EX AERO: 1.0000 | INHALATION_SPRAY | CUTANEOUS | Status: DC | PRN

## 2023-06-15 MED ORDER — LIDOCAINE HCL (PF) 1 % IJ SOLN: 5.0000 mL | INTRAMUSCULAR | Status: DC | PRN

## 2023-06-15 MED ORDER — ANTICOAGULANT SODIUM CITRATE 4% (200MG/5ML) IV SOLN: 5.0000 mL | Status: DC | PRN

## 2023-06-15 MED ORDER — LIDOCAINE-PRILOCAINE 2.5-2.5 % EX CREA: 1.0000 | TOPICAL_CREAM | CUTANEOUS | Status: DC | PRN

## 2023-06-15 MED ORDER — HEPARIN SODIUM (PORCINE) 1000 UNIT/ML DIALYSIS
3000.0000 [IU] | Freq: Once | INTRAMUSCULAR | Status: AC
Start: 2023-06-16 — End: 2023-06-15
  Administered 2023-06-15: 3000 [IU] via INTRAVENOUS_CENTRAL
  Filled 2023-06-15: qty 3

## 2023-06-15 MED ORDER — AMOXICILLIN-POT CLAVULANATE 500-125 MG PO TABS
1.0000 | ORAL_TABLET | Freq: Two times a day (BID) | ORAL | Status: DC
  Administered 2023-06-15: 1 via ORAL
  Filled 2023-06-15 (×2): qty 1

## 2023-06-15 MED ORDER — DOXYCYCLINE HYCLATE 100 MG PO TABS
100.0000 mg | ORAL_TABLET | Freq: Two times a day (BID) | ORAL | 0 refills | Status: AC
  Filled 2023-06-15: qty 28, 14d supply, fill #0

## 2023-06-15 MED ORDER — HEPARIN SODIUM (PORCINE) 1000 UNIT/ML DIALYSIS
2000.0000 [IU] | INTRAMUSCULAR | Status: AC | PRN
  Administered 2023-06-15: 2000 [IU] via INTRAVENOUS_CENTRAL
  Filled 2023-06-15: qty 2

## 2023-06-15 MED ORDER — AMOXICILLIN-POT CLAVULANATE 500-125 MG PO TABS
1.0000 | ORAL_TABLET | Freq: Two times a day (BID) | ORAL | 0 refills | Status: AC
  Filled 2023-06-15: qty 28, 14d supply, fill #0

## 2023-06-15 MED ORDER — NEPRO/CARBSTEADY PO LIQD: 237.0000 mL | ORAL | Status: DC | PRN

## 2023-06-15 MED ORDER — DOXYCYCLINE HYCLATE 100 MG PO TABS
100.0000 mg | ORAL_TABLET | Freq: Two times a day (BID) | ORAL | Status: DC
  Administered 2023-06-15: 100 mg via ORAL
  Filled 2023-06-15: qty 1

## 2023-06-15 MED ORDER — ALTEPLASE 2 MG IJ SOLR: 2.0000 mg | Freq: Once | INTRAMUSCULAR | Status: DC | PRN

## 2023-06-15 MED ORDER — HEPARIN SODIUM (PORCINE) 1000 UNIT/ML DIALYSIS: 1000.0000 [IU] | INTRAMUSCULAR | Status: DC | PRN

## 2023-06-15 NOTE — Progress Notes (Signed)
Received patient in bed.Awake,alert and oriented x 4. He signed his consent 's treatment.  Medicine given : Heparin 3,000 unit pre run dose and 2,000 units mid run dose.                               Access used : Left upper arm AVF that worked well.  Duration of treatment : 3.5 hours.  Uf goal -met 3 liters .  Hemo comment: Tolerated treatment well.  Hand off to the patient's nurse. Back into his room with stable medical condition via transporter.

## 2023-06-15 NOTE — Discharge Summary (Signed)
Physician Discharge Summary  Randy Oneal ZOX:[id number] DOB: 08/17/1965 DOA: 06/12/2023  PCP: [name], MD  Admit date: 06/12/2023 Discharge date: 06/15/2023  Admitted From: home Discharge disposition: home   Recommendations for Outpatient Follow-Up:   Wound care per Dr. Lajoyce Corners   Discharge Diagnosis:   Principal Problem:   Diabetic foot infection (HCC) Active Problems:   Diabetic neuropathy (HCC)   ESRD on hemodialysis [facility])    Discharge Condition: Improved.  Diet recommendation: renal/carb mod  Wound care: None.  Code status: Full.   History of Present Illness:   Randy Oneal is a 57 y.o. male with medical history significant for ESRD on HD MWF, hypertension, insulin-dependent type 2 diabetes, retinopathy, and neuropathy, who was told to come to the ED for further evaluation of his bilateral foot wounds that have been ongoing since August of this year.  He has been managing them at home with dressing changes and has not seen a podiatrist over 1 year as he was discharged from podiatry follow-up.  He states that he does not have any pain and is able to ambulate on both feet, but most concerning was the fact that he developed blisters on the dorsum of both feet approximately 1 week ago.  He states that his blood glucoses usually adequately controlled and he has a Dexcom and monitors this routinely.  He states that his recent A1c was 8.7%.  He has completed hemodialysis on the day of admission.   ED Course: Blood cultures ordered and no leukocytosis noted.  He has been started on double antibiotics in the ED and x-rays are pending.   Hospital Course by Problem:   Diabetic insensate neuropathy with end-stage renal disease on dialysis with ischemic blistering ulcers dorsum of the left foot greater than the right foot. - wound care.   -Dr. Lajoyce Corners will follow-up in the office after discharge to evaluate for progression of the wounds.   - discharge on oral  antibiotic (given 2 weeks of abx-- extend if needed outpatient)    Medical Consultants:   ortho   Discharge Exam:   Vitals:   06/15/23 1255 06/15/23 1320  BP: 119/79 129/85  Pulse: 88 83  Resp: 15 18  Temp: 97.9 F (36.6 C) 97.9 F (36.6 C)  SpO2: 100%    Vitals:   06/15/23 1200 06/15/23 1230 06/15/23 1255 06/15/23 1320  BP: 135/77 118/65 119/79 129/85  Pulse: 81 83 88 83  Resp: 12 15 15 18   Temp:   97.9 F (36.6 C) 97.9 F (36.6 C)  TempSrc:    Oral  SpO2: 99% 100% 100%   Weight:   87.3 kg   Height:        General exam: Appears calm and comfortable. .    The results of significant diagnostics from this hospitalization (including imaging, microbiology, ancillary and laboratory) are listed below for reference.     Procedures and Diagnostic Studies:   DG Foot Complete Right  Result Date: 06/12/2023 CLINICAL DATA:  To blisters on top of middle 3 toes and larger wound on the bottom of the great toe. EXAM: RIGHT FOOT COMPLETE - 3+ VIEW COMPARISON:  Foot radiographs 08/07/2021, MR foot 09/06/2021 FINDINGS: There is a large soft tissue defect along the medial aspect of the great toe. There is no convincing osseous erosion or destruction of the underlying bone. There is no acute fracture or dislocation. Heterotopic bone along the medial margin of the first metatarsal may reflect sequela  of prior infection or trauma. Hallux valgus deformity is noted. Bony alignment is otherwise normal. The joint spaces are overall preserved, without significant osteoarthritic change. IMPRESSION: Large soft tissue defect along the medial aspect of the great toe without convincing osseous erosion or destruction of the underlying bone. Electronically Signed   By: Lesia Hausen M.D.   On: 06/12/2023 16:34   DG Foot Complete Left  Result Date: 06/12/2023 CLINICAL DATA:  Wound on dorsum of foot spanning from great toe to pinky toe. EXAM: LEFT FOOT - COMPLETE 3+ VIEW COMPARISON:  None Available.  FINDINGS: There is no acute fracture or dislocation. Bony alignment is normal. There is mild narrowing of the great toe MTP joint. There is mild inferior calcaneal spurring. There is lucency in the soft tissues of the great toe presumably corresponding to a soft tissue wound. There is no underlying osseous erosion or destruction to suggest advanced osteomyelitis. IMPRESSION: Lucency in the soft tissues of the great toe presumably corresponding to a soft tissue wound without definite evidence of underlying osteomyelitis. Electronically Signed   By: Lesia Hausen M.D.   On: 06/12/2023 16:30     Labs:   Basic Metabolic Panel: Recent Labs  Lab 06/12/23 1230 06/13/23 0402 06/14/23 1444 06/15/23 0714  NA 135 130*  --  133*  K 3.6 4.1  --  4.0  CL 89* 91*  --  92*  CO2 32 30  --  23  GLUCOSE 157* 205*  --  126*  BUN 21* 36*  --  82*  CREATININE 6.11* 8.76*  --  14.04*  CALCIUM 9.0 7.8*  --  8.8*  MG  --  2.3  --   --   PHOS  --   --  2.4*  --    GFR Estimated Creatinine Clearance: 6.2 mL/min (A) (by C-G formula based on SCr of 14.04 mg/dL (H)). Liver Function Tests: Recent Labs  Lab 06/14/23 1444  ALBUMIN 3.3*   No results for input(s): "LIPASE", "AMYLASE" in the last 168 hours. No results for input(s): "AMMONIA" in the last 168 hours. Coagulation profile No results for input(s): "INR", "PROTIME" in the last 168 hours.  CBC: Recent Labs  Lab 06/12/23 1230 06/13/23 0402 06/15/23 0714  WBC 5.2 7.2 6.0  NEUTROABS 3.6  --   --   HGB 12.7* 11.2* 12.1*  HCT 39.2 34.2* 36.4*  MCV 89.1 87.7 86.9  PLT 197 196 220   Cardiac Enzymes: No results for input(s): "CKTOTAL", "CKMB", "CKMBINDEX", "TROPONINI" in the last 168 hours. BNP: Invalid input(s): "POCBNP" CBG: Recent Labs  Lab 06/14/23 1158 06/14/23 1605 06/14/23 2109 06/15/23 0743 06/15/23 1315  GLUCAP 165* 143* 179* 114* 115*   D-Dimer No results for input(s): "DDIMER" in the last 72 hours. Hgb A1c No results for  input(s): "HGBA1C" in the last 72 hours. Lipid Profile No results for input(s): "CHOL", "HDL", "LDLCALC", "TRIG", "CHOLHDL", "LDLDIRECT" in the last 72 hours. Thyroid function studies No results for input(s): "TSH", "T4TOTAL", "T3FREE", "THYROIDAB" in the last 72 hours.  Invalid input(s): "FREET3" Anemia work up No results for input(s): "VITAMINB12", "FOLATE", "FERRITIN", "TIBC", "IRON", "RETICCTPCT" in the last 72 hours. Microbiology Recent Results (from the past 240 hour(s))  Culture, blood (routine x 2)     Status: None (Preliminary result)   Collection Time: 06/12/23 12:30 PM   Specimen: BLOOD  Result Value Ref Range Status   Specimen Description BLOOD RIGHT ANTECUBITAL  Final   Special Requests   Final    BOTTLES  DRAWN AEROBIC AND ANAEROBIC Blood Culture results may not be optimal due to an excessive volume of blood received in culture bottles   Culture   Final    NO GROWTH 3 DAYS Performed at [facility], [address], [place], [place] [address]    Report Status PENDING  Incomplete  Culture, blood (routine x 2)     Status: None (Preliminary result)   Collection Time: 06/12/23  2:39 PM   Specimen: BLOOD  Result Value Ref Range Status   Specimen Description BLOOD RIGHT ANTECUBITAL  Final   Special Requests   Final    BOTTLES DRAWN AEROBIC AND ANAEROBIC Blood Culture results may not be optimal due to an excessive volume of blood received in culture bottles   Culture   Final    NO GROWTH 3 DAYS Performed at [facility], [address], [place], [place] [address]    Report Status PENDING  Incomplete     Discharge Instructions:   Discharge Instructions     Discharge instructions   Complete by: As directed    Renal/carb mod follow-up with Dr. Lajoyce Corners in the office after discharge to evaluate for progression of the wounds   Discharge wound care:   Complete by: As directed    Apply Vashe to the foot wounds daily.  Pack 4 x 4 gauze between the toes plus a dry dressing.    Increase activity slowly   Complete by: As directed       Allergies as of 06/15/2023       Reactions   Tramadol Nausea And Vomiting        Medication List     TAKE these medications    acetaminophen 500 MG tablet Commonly known as: TYLENOL Take 1,000 mg by mouth every 6 (six) hours as needed for moderate pain or headache.   amoxicillin-clavulanate 500-125 MG tablet Commonly known as: AUGMENTIN Take 1 tablet by mouth 2 (two) times daily.   Bromfenac Sodium 0.07 % Soln Commonly known as: Prolensa Place 1 drop into both eyes 4 (four) times daily.   cinacalcet 60 MG tablet Commonly known as: SENSIPAR Take 60 mg by mouth every Monday, Wednesday, and Friday.   doxycycline 100 MG tablet Commonly known as: VIBRA-TABS Take 1 tablet (100 mg total) by mouth every 12 (twelve) hours.   NovoLOG FlexPen 100 UNIT/ML FlexPen Generic drug: insulin aspart 7 units with breakfast and lunch, and 8 units with supper CF NovoLog(BG -130/40)  Max daily 45 units What changed:  how much to take how to take this when to take this additional instructions   prednisoLONE acetate 1 % ophthalmic suspension Commonly known as: PRED FORTE PLACE (1) DROP INTO BOTH EYES FOUR TIMES DAILY   Tresiba FlexTouch 100 UNIT/ML FlexTouch Pen Generic drug: insulin degludec Inject 22 Units into the skin at bedtime. What changed: how much to take   Velphoro 500 MG chewable tablet Generic drug: sucroferric oxyhydroxide Chew 1,000 mg by mouth 3 (three) times daily with meals.               Discharge Care Instructions  (From admission, onward)           Start     Ordered   06/15/23 0000  Discharge wound care:       Comments: Apply Vashe to the foot wounds daily.  Pack 4 x 4 gauze between the toes plus a dry dressing.   06/15/23 1325  Follow-up Information     Nadara Mustard, MD Follow up in 1 week(s).   Specialty: Orthopedic Surgery Contact information: [address] [place] [address] [phone]         [name], MD Follow up in 1 week(s).   Specialty: Family Medicine Contact information: [address] [place] [address] [phone]                  Time coordinating discharge: 45 min  Signed:  Joseph Art DO  [facility] 06/15/2023, 1:25 PM

## 2023-06-15 NOTE — Progress Notes (Addendum)
Pt receives out-pt HD at [facility] on MWF. Will assist as needed.   Olivia Canter Renal Navigator [phone]  Addendum at 3:15 pm: D/C summary noted. Contacted [facility] to advise clinic of pt's d/c today and that pt should resume care Wednesday.

## 2023-06-15 NOTE — Discharge Instructions (Signed)
If antibiotics give you diarrhea, can use OTC lactobacillus (pills or yogurt)

## 2023-06-15 NOTE — Progress Notes (Signed)
Orthopedic Tech Progress Note Patient Details:  Randy Oneal [birth year] [id number]  Ortho Devices Type of Ortho Device: Postop shoe/boot Ortho Device/Splint Location: BLE Ortho Device/Splint Interventions: Application   Post Interventions Patient Tolerated: Well Instructions Provided: Adjustment of device  [name] [name] 06/15/2023, 3:34 PM

## 2023-06-15 NOTE — Consult Note (Signed)
ORTHOPAEDIC CONSULTATION  REQUESTING PHYSICIAN: Joseph Art, DO  Chief Complaint: Blistering ulcers dorsum left foot greater than right foot.  HPI: Randy Oneal is a 57 y.o. male who presents with blistering ulcers dorsum of the left foot greater than the right foot.  Patient states he has been wearing custom orthotics and feels like this may have caused his foot to rub on the shoe.  Patient reports no recent trauma.  Past Medical History:  Diagnosis Date   Anemia    Cataract 07/02/2020   Diabetes mellitus    Type 2    Diabetic retinopathy of both eyes (HCC) 01/31/2019   [name] 01/2019   ESRD (end stage renal disease) (HCC)    [place] Frenisus   Herpes simplex virus (HSV) infection    OU   Hypertension    Hypertensive retinopathy    OU   Retinal detachment    OS   Sepsis due to Streptococcus, group B (HCC) 10/05/2018   Past Surgical History:  Procedure Laterality Date   25 GAUGE PARS PLANA VITRECTOMY WITH 20 GAUGE MVR PORT FOR MACULAR HOLE Right 02/24/2019   Procedure: 25 GAUGE PARS PLANA VITRECTOMY WITH 20 GAUGE MVR PORT FOR MACULAR HOLE;  Surgeon: [name], MD;  Location: [facility] OR;  Service: Ophthalmology;  Laterality: Right;   APPLICATION OF WOUND VAC Left 08/27/2018   Procedure: APPLICATION OF WOUND VAC, left neck;  Surgeon: [name], MD;  Location: [facility] OR;  Service: Thoracic;  Laterality: Left;   APPLICATION OF WOUND VAC Left 08/30/2018   Procedure: APPLICATION OF WOUND VAC;  Surgeon: [name], MD;  Location: [facility] OR;  Service: Thoracic;  Laterality: Left;   AV FISTULA PLACEMENT Left 02/15/2020   Procedure: LEFT ARM ARTERIOVENOUS (AV) FISTULA CREATION;  Surgeon: [name], MD;  Location: [facility] OR;  Service: Vascular;  Laterality: Left;   CATARACT EXTRACTION Bilateral    CATARACT EXTRACTION W/ INTRAOCULAR LENS  IMPLANT, BILATERAL     COLONOSCOPY  06/24/2011   Procedure: COLONOSCOPY;  Surgeon: [name], MD;  Location: [facility] ENDO  SUITE;  Service: Endoscopy;  Laterality: N/A;  8:30 AM   EYE SURGERY     GAS/FLUID EXCHANGE Left 03/31/2019   Procedure: Gas/Fluid Exchange;  Surgeon: [name], MD;  Location: [facility] OR;  Service: Ophthalmology;  Laterality: Left;   INJECTION OF SILICONE OIL  02/24/2019   Procedure: Injection Of Silicone Oil;  Surgeon: [name], MD;  Location: [facility] OR;  Service: Ophthalmology;;   INSERTION OF DIALYSIS CATHETER Right 12/05/2019   Procedure: INSERTION OF DIALYSIS CATHETER RIGHT SUBCLAVIAN;  Surgeon: [name], MD;  Location: [facility] ORS;  Service: General;  Laterality: Right;   INSERTION OF DIALYSIS CATHETER Right 12/07/2019   Procedure: Minor Dialysis catheter in place- need to reposition/ adjust catheter to help with flows;  Surgeon: [name], MD;  Location: [facility] ORS;  Service: General;  Laterality: Right;  procedure room case with 1% lidocaine, full sterile drape,  towels, full gown, large chloraprep, 4-0 Monocryl and dermabond    INSERTION OF DIALYSIS CATHETER Right 12/08/2019   Procedure: INSERTION OF DIALYSIS CATHETER EXCHANGE;  Surgeon: [name], MD;  Location: [facility] ORS;  Service: General;  Laterality: Right;   IR ANGIOGRAM EXTREMITY RIGHT  03/15/2021   IR RADIOLOGIST EVAL & MGMT  02/07/2021   IR RADIOLOGIST EVAL & MGMT  05/09/2021   IR US GUIDE VASC ACCESS RIGHT  03/15/2021   MEMBRANE PEEL Right 02/24/2019  Procedure: [place] Flax;  Surgeon: [name], MD;  Location: [facility] OR;  Service: Ophthalmology;  Laterality: Right;   PARS PLANA VITRECTOMY Left 03/31/2019   Procedure: PARS PLANA VITRECTOMY WITH 25 GAUGE WITH MEMBRANE PEEL;  Surgeon: [name], MD;  Location: [facility] OR;  Service: Ophthalmology;  Laterality: Left;   PHOTOCOAGULATION WITH LASER Right 02/24/2019   Procedure: Photocoagulation With Laser;  Surgeon: [name], MD;  Location: [facility] OR;  Service: Ophthalmology;  Laterality: Right;   PHOTOCOAGULATION WITH LASER Left 03/31/2019   Procedure: Photocoagulation With  Laser;  Surgeon: [name], MD;  Location: [facility] OR;  Service: Ophthalmology;  Laterality: Left;   RETINAL DETACHMENT SURGERY Left 03/31/2019   TRD Repair - Dr. [name]   SILICON OIL REMOVAL Right 03/31/2019   Procedure: Silicon Oil Removal;  Surgeon: [name], MD;  Location: [facility] OR;  Service: Ophthalmology;  Laterality: Right;   STERNAL WOUND DEBRIDEMENT Left 08/27/2018   Procedure: Incision and DEBRIDEMENT Left Chest, Neck and Mediastinum;  Surgeon: [name], MD;  Location: [facility] OR;  Service: Thoracic;  Laterality: Left;   STERNAL WOUND DEBRIDEMENT Left 08/30/2018   Procedure: WOUND VAC CHANGE, LEFT CHEST AND NECK, POSSIBLE DEBRIDEMENT;  Surgeon: [name], MD;  Location: [facility] OR;  Service: Thoracic;  Laterality: Left;   Social History   Socioeconomic History   Marital status: Married    Spouse name: Not on file   Number of children: Not on file   Years of education: Not on file   Highest education level: Not on file  Occupational History   Not on file  Tobacco Use   Smoking status: Never   Smokeless tobacco: Never  Vaping Use   Vaping status: Never Used  Substance and Sexual Activity   Alcohol use: No   Drug use: No   Sexual activity: Yes  Other Topics Concern   Not on file  Social History Narrative   Not on file   Social Determinants of Health   Financial Resource Strain: Low Risk  (04/23/2023)   Overall Financial Resource Strain (CARDIA)    Difficulty of Paying Living Expenses: Not hard at all  Food Insecurity: No Food Insecurity (10/02/2022)   Hunger Vital Sign    Worried About Running Out of Food in the Last Year: Never true    Ran Out of Food in the Last Year: Never true  Transportation Needs: No Transportation Needs (05/28/2023)   PRAPARE - Administrator, Civil Service (Medical): No    Lack of Transportation (Non-Medical): No  Physical Activity: Sufficiently Active (01/15/2023)   Exercise Vital Sign    Days of Exercise per Week: 3 days     Minutes of Exercise per Session: 60 min  Stress: No Stress Concern Present (10/02/2022)   [employer] of Occupational Health - Occupational Stress Questionnaire    Feeling of Stress : Only a little  Social Connections: Moderately Integrated (10/02/2022)   Social Connection and Isolation Panel [NHANES]    Frequency of Communication with Friends and Family: More than three times a week    Frequency of Social Gatherings with Friends and Family: More than three times a week    Attends Religious Services: More than 4 times per year    Active Member of [employer] or Organizations: No    Attends Banker Meetings: Never    Marital Status: Married   Family History  Problem Relation Age of Onset   Hypertension Mother    Colon cancer Neg  Hx    - negative except otherwise stated in the family history section Allergies  Allergen Reactions   Tramadol Nausea And Vomiting   Prior to Admission medications   Medication Sig Start Date End Date Taking? Authorizing Provider  acetaminophen (TYLENOL) 500 MG tablet Take 1,000 mg by mouth every 6 (six) hours as needed for moderate pain or headache.   Yes [provider]  Bromfenac Sodium (PROLENSA) 0.07 % SOLN Place 1 drop into both eyes 4 (four) times daily. 05/26/23  Yes [name], MD  cinacalcet [facility]) 60 MG tablet Take 60 mg by mouth every Monday, Wednesday, and Friday.   Yes [provider]  insulin aspart (NOVOLOG FLEXPEN) 100 UNIT/ML FlexPen 7 units with breakfast and lunch, and 8 units with supper CF NovoLog(BG -130/40)  Max daily 45 units Patient taking differently: Inject 9-10 Units into the skin 3 (three) times daily with meals. 01/13/23  Yes Shamleffer, Konrad Dolores, MD  insulin degludec (TRESIBA FLEXTOUCH) 100 UNIT/ML FlexTouch Pen Inject 22 Units into the skin at bedtime. Patient taking differently: Inject 21 Units into the skin at bedtime. 01/13/23  Yes Shamleffer, Konrad Dolores, MD  prednisoLONE  acetate (PRED FORTE) 1 % ophthalmic suspension PLACE (1) DROP INTO BOTH EYES FOUR TIMES DAILY 09/18/22  Yes [name], MD  sucroferric oxyhydroxide (VELPHORO) 500 MG chewable tablet Chew 1,000 mg by mouth 3 (three) times daily with meals.   Yes [provider]   MR FOOT RIGHT W WO CONTRAST  Result Date: 06/14/2023 CLINICAL DATA:  Right foot blisters EXAM: MRI OF THE RIGHT FOREFOOT WITHOUT AND WITH CONTRAST TECHNIQUE: Multiplanar, multisequence MR imaging of the right forefoot was performed before and after the administration of intravenous contrast. CONTRAST:  7mL GADAVIST GADOBUTROL 1 MMOL/ML IV SOLN COMPARISON:  X-ray 06/12/2023 FINDINGS: Bones/Joint/Cartilage Faint bone marrow edema at the base of the great toe distal phalanx and at the head of the great toe proximal phalanx. Preservation of the fatty T1 marrow signal at this location. No focal erosion. No additional convincing sites of bone marrow edema. No fracture or dislocation. Degenerative changes are most pronounced at the first MTP joint. Ligaments Intact Lisfranc ligament.  Intact collateral ligaments. Muscles and Tendons Denervation changes of the foot musculature. Mild-moderate tenosynovitis of the flexor hallucis longus tendon. Soft tissues Plantar and medial soft tissue ulcerations of the great toe. There are cutaneous blisters along the dorsal aspects of the third, fourth, and fifth toes as well as a tiny blister along the plantar surface of the third toe. No organized or drainable fluid collections. IMPRESSION: RIGHT FOREFOOT: 1. Soft tissue ulcerations of the great toe. Faint bone marrow edema at the base of the great toe distal phalanx and at the head of the great toe proximal phalanx. Findings are less prominent when compared to the prior MRI and may represent reactive osteitis or early acute osteomyelitis. 2. Mild-moderate tenosynovitis of the flexor hallucis longus tendon. 3. Cutaneous blisters along the dorsal aspects of the  third, fourth, and fifth toes as well as a tiny blister along the plantar surface of the third toe. Electronically Signed   By: Duanne Guess D.O.   On: 06/14/2023 20:32   MR FOOT LEFT W WO CONTRAST  Result Date: 06/14/2023 CLINICAL DATA:  Dorsal left foot wound EXAM: MRI OF THE LEFT FOREFOOT WITHOUT AND WITH CONTRAST TECHNIQUE: Multiplanar, multisequence MR imaging of the left forefoot was performed both before and after administration of intravenous contrast. CONTRAST:  7mL GADAVIST GADOBUTROL 1 MMOL/ML  IV SOLN COMPARISON:  X-ray 06/12/2023 FINDINGS: Bones/Joint/Cartilage There is some artifact at the edge of the field of view resulting in poor fat saturation of the toes. Within this limitation, there does appear to be mild bone marrow edema within the proximal and middle phalanx of the second toe (series 18, image 16). No confluent low T1 bone marrow signal or erosion. Possible nondisplaced fracture through the distal aspect of the second toe proximal phalanx (best seen on series 23, image 8). Possible faint bone marrow edema within the distal phalanx of the fourth toe with preserved T1 marrow signal. Suspect additional nondisplaced fracture through the middle phalanx of the fourth toe (see series 3, image 6), likely subacute or chronic given lack of associated bone marrow edema. Remaining osseous structures of the forefoot are otherwise intact. No additional fractures. No malalignment. No convincing sites of marrow replacement. Ligaments Intact Lisfranc ligament.  Intact collateral ligaments. Muscles and Tendons Diffuse edema-like signal of the foot musculature which may represent changes related to denervation and/or myositis. No tenosynovitis. Soft tissues Lobulated T2 hyperintense collection surrounds the distal phalanx of the fourth toe which measures approximately 16 x 11 x 13 mm. This could represent a focal blister, ganglion cyst, or abscess. Superficial ulceration over the dorsum of the distal  forefoot overlying the second toe. IMPRESSION: LEFT FOREFOOT: 1. Mild bone marrow edema within the proximal and middle phalanx of the second toe. This is suggestive of early osteomyelitis given the overlying superficial ulceration. 2. Possible nondisplaced fracture of the second toe proximal phalanx. Suspect additional nondisplaced fracture of the middle phalanx of the fourth toe. 3. Possible faint bone marrow edema within the distal phalanx of the fourth toe, favored to represent reactive osteitis. 4. Lobulated T2 hyperintense collection surrounds the distal phalanx of the fourth toe which measures approximately 16 x 11 x 13 mm. This could represent a focal blister, ganglion cyst, or small abscess. 5. Diffuse edema-like signal of the foot musculature which may represent changes related to denervation and/or myositis. Electronically Signed   By: Duanne Guess D.O.   On: 06/14/2023 20:21   - pertinent xrays, CT, MRI studies were reviewed and independently interpreted  Positive ROS: All other systems have been reviewed and were otherwise negative with the exception of those mentioned in the HPI and as above.  Physical Exam: General: Alert, no acute distress Psychiatric: Patient is competent for consent with normal mood and affect Lymphatic: No axillary or cervical lymphadenopathy Cardiovascular: No pedal edema Respiratory: No cyanosis, no use of accessory musculature GI: No organomegaly, abdomen is soft and non-tender    Images:  @ENCIMAGES @  Labs:  Lab Results  Component Value Date   HGBA1C 8.0 (A) 01/13/2023   HGBA1C >15.0 08/27/2022   HGBA1C 8.5 (A) 10/10/2020   ESRSEDRATE 110 (H) 06/12/2023   ESRSEDRATE 81 (H) 08/17/2018   CRP 11.3 (H) 06/12/2023   CRP 30.0 (H) 08/17/2018   REPTSTATUS PENDING 06/12/2023   GRAMSTAIN  08/27/2018    ABUNDANT WBC PRESENT, PREDOMINANTLY PMN NO ORGANISMS SEEN    CULT  06/12/2023    NO GROWTH 3 DAYS Performed at [facility], [address]  [place], [place] [address]    LABORGA GROUP B STREP(S.AGALACTIAE)ISOLATED 08/17/2018    Lab Results  Component Value Date   ALBUMIN 3.3 (L) 06/14/2023   ALBUMIN 3.6 04/19/2023   ALBUMIN 2.8 (L) 12/08/2019   PREALBUMIN <5.0 (L) 08/17/2018        Latest Ref Rng & Units 06/13/2023  4:02 AM 06/12/2023   12:30 PM 04/19/2023    1:10 PM  CBC EXTENDED  WBC 4.0 - 10.5 K/uL 7.2  5.2  7.2   RBC 4.22 - 5.81 MIL/uL 3.90  4.40  4.47   Hemoglobin 13.0 - 17.0 g/dL 29.5  62.1  30.8   HCT 39.0 - 52.0 % 34.2  39.2  39.4   Platelets 150 - 400 K/uL 196  197  217   NEUT# 1.7 - 7.7 K/uL  3.6  5.5   Lymph# 0.7 - 4.0 K/uL  1.1  0.8     Neurologic: Patient does not have protective sensation bilateral lower extremities.   MUSCULOSKELETAL:   Skin: Examination patient has superficial blisters over the dorsum of the left forefoot and the dorsum of all toes.  There is maceration between the toes.  There is ischemic skin changes.  Examination of the dorsum of the right foot there is a chronic ulcer beneath the right great toe IP joint.  Patient has a strong palpable dorsalis pedis pulse bilaterally.  Review of the MRI scan shows some edema in the left second toe as well as the right great toe.  The radiograph shows calcified arteries to the hindfoot.  White cell count 7.2 with a hemoglobin of 11.2.  Sed rate 110 with a C-reactive protein of 11.3.  No ascending cellulitis.  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  Plan: Plan: Would continue antibiotics.  I will write orders for wound care.  Will follow-up in the office after discharge to evaluate for progression of the wounds.  Would discharge on oral antibiotics.  Thank you for the consult and the opportunity to see Mr. Roque Schill, MD [facility] [phone] 7:52 AM

## 2023-06-15 NOTE — Plan of Care (Signed)

## 2023-06-15 NOTE — Progress Notes (Signed)
[facility] Progress Note   Subjective:   Very pleasant, no concerns today. Denies SOB, CP, dizziness, nausea. Tolerating HD without issues.   Objective Vitals:   06/14/23 1400 06/14/23 1604 06/14/23 1928 06/15/23 0509  BP:  (!) 155/95 129/73 134/84  Pulse:  87 90 78  Resp:  15 16 16   Temp:  98.6 F (37 C) 98.2 F (36.8 C) 98.1 F (36.7 C)  TempSrc:  Oral Oral Oral  SpO2:  100% 100% 100%  Weight: 88.6 kg     Height:       Physical Exam General: Alert, well appearing male in NAD Heart: RRR, no murmurs, rubs or gallops Lungs: CTA bilaterally Abdomen: Soft, non-distended, +BS Extremities: No edema b/l lower extremities Dialysis Access: AVF accessed   Additional Objective Labs: Basic Metabolic Panel: Recent Labs  Lab 06/12/23 1230 06/13/23 0402 06/14/23 1444  NA 135 130*  --   K 3.6 4.1  --   CL 89* 91*  --   CO2 32 30  --   GLUCOSE 157* 205*  --   BUN 21* 36*  --   CREATININE 6.11* 8.76*  --   CALCIUM 9.0 7.8*  --   PHOS  --   --  2.4*   Liver Function Tests: Recent Labs  Lab 06/14/23 1444  ALBUMIN 3.3*   No results for input(s): "LIPASE", "AMYLASE" in the last 168 hours. CBC: Recent Labs  Lab 06/12/23 1230 06/13/23 0402 06/15/23 0714  WBC 5.2 7.2 6.0  NEUTROABS 3.6  --   --   HGB 12.7* 11.2* 12.1*  HCT 39.2 34.2* 36.4*  MCV 89.1 87.7 86.9  PLT 197 196 220   Blood Culture    Component Value Date/Time   SDES BLOOD RIGHT ANTECUBITAL 06/12/2023 1439   SPECREQUEST  06/12/2023 1439    BOTTLES DRAWN AEROBIC AND ANAEROBIC Blood Culture results may not be optimal due to an excessive volume of blood received in culture bottles   CULT  06/12/2023 1439    NO GROWTH 3 DAYS Performed at [facility], [address] [place], [place] [address]    REPTSTATUS PENDING 06/12/2023 1439    Cardiac Enzymes: No results for input(s): "CKTOTAL", "CKMB", "CKMBINDEX", "TROPONINI" in the last 168 hours. CBG: Recent Labs  Lab 06/14/23 0800  06/14/23 1158 06/14/23 1605 06/14/23 2109 06/15/23 0743  GLUCAP 108* 165* 143* 179* 114*   Iron Studies: No results for input(s): "IRON", "TIBC", "TRANSFERRIN", "FERRITIN" in the last 72 hours. @lablastinr3 @ Studies/Results: MR FOOT RIGHT W WO CONTRAST  Result Date: 06/14/2023 CLINICAL DATA:  Right foot blisters EXAM: MRI OF THE RIGHT FOREFOOT WITHOUT AND WITH CONTRAST TECHNIQUE: Multiplanar, multisequence MR imaging of the right forefoot was performed before and after the administration of intravenous contrast. CONTRAST:  7mL GADAVIST GADOBUTROL 1 MMOL/ML IV SOLN COMPARISON:  X-ray 06/12/2023 FINDINGS: Bones/Joint/Cartilage Faint bone marrow edema at the base of the great toe distal phalanx and at the head of the great toe proximal phalanx. Preservation of the fatty T1 marrow signal at this location. No focal erosion. No additional convincing sites of bone marrow edema. No fracture or dislocation. Degenerative changes are most pronounced at the first MTP joint. Ligaments Intact Lisfranc ligament.  Intact collateral ligaments. Muscles and Tendons Denervation changes of the foot musculature. Mild-moderate tenosynovitis of the flexor hallucis longus tendon. Soft tissues Plantar and medial soft tissue ulcerations of the great toe. There are cutaneous blisters along the dorsal aspects of the third, fourth, and fifth toes as well  as a tiny blister along the plantar surface of the third toe. No organized or drainable fluid collections. IMPRESSION: RIGHT FOREFOOT: 1. Soft tissue ulcerations of the great toe. Faint bone marrow edema at the base of the great toe distal phalanx and at the head of the great toe proximal phalanx. Findings are less prominent when compared to the prior MRI and may represent reactive osteitis or early acute osteomyelitis. 2. Mild-moderate tenosynovitis of the flexor hallucis longus tendon. 3. Cutaneous blisters along the dorsal aspects of the third, fourth, and fifth toes as well as a  tiny blister along the plantar surface of the third toe. Electronically Signed   By: Duanne Guess D.O.   On: 06/14/2023 20:32   MR FOOT LEFT W WO CONTRAST  Result Date: 06/14/2023 CLINICAL DATA:  Dorsal left foot wound EXAM: MRI OF THE LEFT FOREFOOT WITHOUT AND WITH CONTRAST TECHNIQUE: Multiplanar, multisequence MR imaging of the left forefoot was performed both before and after administration of intravenous contrast. CONTRAST:  7mL GADAVIST GADOBUTROL 1 MMOL/ML IV SOLN COMPARISON:  X-ray 06/12/2023 FINDINGS: Bones/Joint/Cartilage There is some artifact at the edge of the field of view resulting in poor fat saturation of the toes. Within this limitation, there does appear to be mild bone marrow edema within the proximal and middle phalanx of the second toe (series 18, image 16). No confluent low T1 bone marrow signal or erosion. Possible nondisplaced fracture through the distal aspect of the second toe proximal phalanx (best seen on series 23, image 8). Possible faint bone marrow edema within the distal phalanx of the fourth toe with preserved T1 marrow signal. Suspect additional nondisplaced fracture through the middle phalanx of the fourth toe (see series 3, image 6), likely subacute or chronic given lack of associated bone marrow edema. Remaining osseous structures of the forefoot are otherwise intact. No additional fractures. No malalignment. No convincing sites of marrow replacement. Ligaments Intact Lisfranc ligament.  Intact collateral ligaments. Muscles and Tendons Diffuse edema-like signal of the foot musculature which may represent changes related to denervation and/or myositis. No tenosynovitis. Soft tissues Lobulated T2 hyperintense collection surrounds the distal phalanx of the fourth toe which measures approximately 16 x 11 x 13 mm. This could represent a focal blister, ganglion cyst, or abscess. Superficial ulceration over the dorsum of the distal forefoot overlying the second toe. IMPRESSION:  LEFT FOREFOOT: 1. Mild bone marrow edema within the proximal and middle phalanx of the second toe. This is suggestive of early osteomyelitis given the overlying superficial ulceration. 2. Possible nondisplaced fracture of the second toe proximal phalanx. Suspect additional nondisplaced fracture of the middle phalanx of the fourth toe. 3. Possible faint bone marrow edema within the distal phalanx of the fourth toe, favored to represent reactive osteitis. 4. Lobulated T2 hyperintense collection surrounds the distal phalanx of the fourth toe which measures approximately 16 x 11 x 13 mm. This could represent a focal blister, ganglion cyst, or small abscess. 5. Diffuse edema-like signal of the foot musculature which may represent changes related to denervation and/or myositis. Electronically Signed   By: Duanne Guess D.O.   On: 06/14/2023 20:21   Medications:  anticoagulant sodium citrate     ceFEPime (MAXIPIME) IV     vancomycin      calcitRIOL  0.5 mcg Oral Q M,W,F-1800   Chlorhexidine Gluconate Cloth  6 each Topical Daily   Chlorhexidine Gluconate Cloth  6 each Topical Q0600   cinacalcet  60 mg Oral Q M,W,F   [START  ON 06/16/2023] heparin  3,000 Units Dialysis Once in dialysis   heparin  5,000 Units Subcutaneous Q8H   insulin aspart  0-5 Units Subcutaneous QHS   insulin aspart  0-9 Units Subcutaneous TID WC   insulin aspart  9 Units Subcutaneous TID WC   insulin glargine-yfgn  21 Units Subcutaneous QHS   ketorolac  1 drop Both Eyes QID   prednisoLONE acetate  1 drop Both Eyes QID   sucroferric oxyhydroxide  1,000 mg Oral TID WC    Dialysis Orders: MWF [facility] 4h  450/500   86.9kg   2/2.5 bath  LUA AVF   Heparin 5000 - rocaltrol 0.5 mcg  - no esa - last OP HD 11/1, post wt 86.9kg, good compliance - last Hb 11.4 on 10/28    Assessment/Plan: Bilat diabetic foot infections - on vanc/ cefepime, Dr. Lajoyce Corners recommending antibiotics and wound care with outpatient follow up.  ESKD - on  HD MWF. Has not missed. Tolerating HD well today.  BP - BP's wnl, not on BP meds at home Volume - 3-5 kg wt gains, getting to dry wt. No vol excess on exam. UF with HD today as tolerated.  Anemia of eskd - Hb 11- 13 here, not on esa at OP unit. Follow.  MBD ckd - Calcium controlled, last phos 2.4. Rechecking today, may need to lower binder dose     Rogers Blocker, PA-C 06/15/2023, 8:23 AM  [facility] Pager: [phone]

## 2023-06-15 NOTE — TOC Initial Note (Signed)
Transition of Care [facility]) - Initial/Assessment Note    Patient Details  Name: Randy Oneal MRN: [id number] Date of Birth: Sep 11, 1965  Transition of Care [facility]) CM/SW Contact:    Harriet Masson, RN Phone Number: 06/15/2023, 2:18 PM  Clinical Narrative:                 Spoke to patient regarding transition needs.  Patient lives with wife and father in Social worker.  Temporary address is  [address] [place], [place] [address]. Offered patient home health choice and patient deferred to this RNCM to find highly rated agency.  Kandee Keen with [facility] accepted referral.  Patient has transportation home. PCP confirmed.   Expected Discharge Plan: Home w Home Health Services Barriers to Discharge: Continued Medical Work up   Patient Goals and CMS Choice Patient states their goals for this hospitalization and ongoing recovery are:: return home CMS Medicare.gov Compare Post Acute Care list provided to:: Patient Choice offered to / list presented to : Patient      Expected Discharge Plan and Services   Discharge Planning Services: CM Consult Post Acute Care Choice: Home Health Living arrangements for the past 2 months: Single Family Home Expected Discharge Date: 06/15/23                         HH Arranged: RN HH Agency: [facility] Date [facility] Agency Contacted: 06/15/23 Time HH Agency Contacted: [id number] Representative spoke with at [facility] Agency: Kandee Keen  Prior Living Arrangements/Services Living arrangements for the past 2 months: Single Family Home Lives with:: Spouse, Relatives Patient language and need for interpreter reviewed:: Yes Do you feel safe going back to the place where you live?: Yes      Need for Family Participation in Patient Care: Yes (Comment) Care giver support system in place?: Yes (comment)   Criminal Activity/Legal Involvement Pertinent to Current Situation/Hospitalization: No - Comment as needed  Activities of Daily Living      Permission  Sought/Granted                  Emotional Assessment Appearance:: Appears stated age Attitude/Demeanor/Rapport: Gracious Affect (typically observed): Accepting Orientation: : Oriented to Self, Oriented to Place, Oriented to Situation, Oriented to  Time Alcohol / Substance Use: Not Applicable Psych Involvement: No (comment)  Admission diagnosis:  Diabetic foot infection (HCC) [O96.295, L08.9] Patient Active Problem List   Diagnosis Date Noted   Diabetic foot infection (HCC) 06/12/2023   Diabetic ulcer of right midfoot associated with diabetes mellitus due to underlying condition, with fat layer exposed (HCC) 04/17/2022   COVID-19 10/23/2021   Other fluid overload 07/30/2021   Nausea 05/22/2021   Peripheral neuropathy 12/19/2020   Type 2 diabetes mellitus with retinopathy, with long-term current use of insulin (HCC) 07/12/2020   Type 2 diabetes mellitus with chronic kidney disease on chronic dialysis, with long-term current use of insulin (HCC) 07/12/2020   Type 2 diabetes mellitus with diabetic neuropathy, with long-term current use of insulin (HCC) 07/12/2020   Bilateral retinal detachment 07/02/2020   Blurry vision, bilateral 07/02/2020   Cataract 07/02/2020   ESRD on hemodialysis (HCC) 07/02/2020   Gastroesophageal reflux disease without esophagitis 07/02/2020   History of Bell's palsy 07/02/2020   Hyperlipidemia 07/02/2020   Personal history of septic arthritis 07/02/2020   Hypercalcemia 03/05/2020   Allergy, unspecified, initial encounter 02/29/2020   Anaphylactic shock, unspecified, initial encounter 02/29/2020   Hypothyroidism, unspecified 12/10/2019   Coagulation defect, unspecified (  HCC) 12/09/2019   Diarrhea, unspecified 12/09/2019   Dyspnea, unspecified 12/09/2019   Iron deficiency anemia, unspecified 12/09/2019   Pain, unspecified 12/09/2019   Pruritus, unspecified 12/09/2019   Secondary hyperparathyroidism of renal origin (HCC) 12/09/2019   Displacement of  vascular dialysis catheter (HCC)    Acute on chronic renal failure (HCC) 12/01/2019   End stage kidney disease (HCC)    Skin callus 08/03/2019   Pain due to onychomycosis of toenails of both feet 04/20/2019   Posterior tibial tendon dysfunction (PTTD) of both lower extremities 04/20/2019   Diabetic neuropathy (HCC) 04/20/2019   Diabetic retinopathy of both eyes (HCC) 01/31/2019   Type 2 diabetes mellitus with hyperglycemia, with long-term current use of insulin (HCC) 10/20/2018   Sepsis due to Streptococcus, group B (HCC) 10/05/2018   Dietary counseling and surveillance 09/15/2018   Non compliance with medical treatment 09/15/2018   Open wound of right great toe    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/27/2018   Cellulitis of great toe, right    Diabetic hyperosmolar non-ketotic state (HCC) 08/17/2018   Hyperglycemia 08/17/2018   AKI (acute kidney injury) (HCC) 08/16/2018   Hyponatremia 08/16/2018   Hyperlipidemia associated with type 2 diabetes mellitus (HCC) 03/04/2017   Erectile dysfunction 03/04/2017   Personal history of noncompliance with medical treatment, presenting hazards to health 11/22/2015   Essential hypertension 01/30/2014   Loss of weight 01/30/2014   Type 2 diabetes mellitus not at goal [facility]) 03/30/2013   PCP:  [name], MD Pharmacy:   [facility] - [place], [place] - [address] [place] [address] Phone: [phone] Fax: [phone]  [facility] [place] (New Address) - [place], [place] - [address] AT Previously: [address], [place] [address] [facility] [address] 4th Floor [address] [place] [address] Phone: [phone] Fax: [phone]  [id number] - [place], [place] - [address] AT [facility] OF [address] [place] [address] Phone: [phone] Fax: [phone]  [facility] [address] [place] [address] Phone: [phone] Fax: [phone]     Social Determinants of Health (SDOH) Social History: SDOH Screenings   Food Insecurity: No Food Insecurity (10/02/2022)  Housing: Low Risk  (05/28/2023)  Transportation Needs: No Transportation Needs (05/28/2023)  Utilities: Not At Risk (10/02/2022)  Alcohol Screen: Low Risk  (10/02/2022)  Depression (PHQ2-9): Low Risk  (10/02/2022)  Financial Resource Strain: Low Risk  (04/23/2023)  Physical Activity: Sufficiently Active (01/15/2023)  Social Connections: Moderately Integrated (10/02/2022)  Stress: No Stress Concern Present (10/02/2022)  Tobacco Use: Low Risk  (06/12/2023)   SDOH Interventions:     Readmission Risk Interventions     No data to display

## 2023-06-16 ENCOUNTER — Telehealth: Payer: Self-pay

## 2023-06-16 LAB — HEPATITIS B SURFACE ANTIBODY, QUANTITATIVE: Hep B S AB Quant (Post): 121 m[IU]/mL

## 2023-06-16 NOTE — Transitions of Care (Post Inpatient/ED Visit) (Signed)
   06/16/2023  Name: Randy Oneal MRN: [id number] DOB: 09/12/1965  Today's TOC FU Call Status: Today's TOC FU Call Status:: Unsuccessful Call (1st Attempt) Unsuccessful Call (1st Attempt) Date: 06/16/23  Attempted to reach the patient regarding the most recent Inpatient/ED visit.  Follow Up Plan: Additional outreach attempts will be made to reach the patient to complete the Transitions of Care (Post Inpatient/ED visit) call.   Jodelle Gross RN, BSN, CCM RN Care Manager  Transitions of Care  [facility] - [facility]  [phone]

## 2023-06-16 NOTE — Discharge Planning (Signed)
[facility] Patient Discharge Orders- [facility] CLINIC: [place]  Patient's name: Quill Grinder III Admit/DC Dates: 06/12/2023 - 06/15/2023  Discharge Diagnoses: Diabetic foot infection   Aranesp: Given: No   Date and amount of last dose: N/a  Last Hgb: 12.1 PRBC's Given: No Date/# of units: n/a ESA dose for discharge: none IV Iron dose at discharge: none  Heparin change: no  EDW Change: no New EDW:   [place]ath Change: no  Access intervention/Change: no Details:  Hectorol/Calcitriol change: no  Discharge Labs: Calcium 8.8 Phosphorus 2.4 Albumin 2.4 K+ 4.0  IV Antibiotics: no (on PO augmentin) Details:  On Coumadin?: no Last INR: Next INR: Managed By:   OTHER/APPTS/LAB ORDERS:    D/C Meds to be reconciled by nurse after every discharge.  Completed By: Rogers Blocker, PA-C 06/16/2023, 8:03 AM  [facility] Pager: [phone]   Reviewed by: MD:______ RN_______

## 2023-06-17 ENCOUNTER — Telehealth: Payer: Self-pay | Admitting: Physician Assistant

## 2023-06-17 ENCOUNTER — Telehealth: Payer: Self-pay

## 2023-06-17 LAB — CULTURE, BLOOD (ROUTINE X 2)
Culture: NO GROWTH
Culture: NO GROWTH

## 2023-06-17 NOTE — Telephone Encounter (Signed)
Transition of care contact from inpatient facility  Date of Discharge: 06/15/23 Date of Contact: 06/17/23 Method of contact: Phone  Attempted to contact patient to discuss transition of care from inpatient admission. Patient did not answer the phone. Message was left on the patient's voicemail with call back instructions. Also called his HD unit but there was no answer at nursing station.  Of note, augmentin should be once daily for an ESRD patient, will continue to try to reach patient to adjust dose.   Rogers Blocker, PA-C 06/17/2023, 11:55 AM  [facility] Pager: [phone]

## 2023-06-17 NOTE — Transitions of Care (Post Inpatient/ED Visit) (Signed)
06/17/2023  Name: Randy Oneal MRN: [id number] DOB: September 20, 1965  Today's TOC FU Call Status: Today's TOC FU Call Status:: Successful TOC FU Call Completed TOC FU Call Complete Date: 06/17/23 Patient's Name and Date of Birth confirmed.  Transition Care Management Follow-up Telephone Call Date of Discharge: 06/15/23 Discharge Facility[facility]) Type of Discharge: Inpatient Admission Primary Inpatient Discharge Diagnosis:: Bilateral Diabetic Foot Infections How have you been since you were released from the hospital?: Better Any questions or concerns?: No  Items Reviewed: Did you receive and understand the discharge instructions provided?: Yes Medications obtained,verified, and reconciled?: Yes (Medications Reviewed) Any new allergies since your discharge?: No Dietary orders reviewed?: No Do you have support at home?: Yes People in Home: spouse Name of Support/Comfort Primary Source: Burna Mortimer  Medications Reviewed Today: Medications Reviewed Today     Reviewed by Jodelle Gross, RN (Case Manager) on 06/17/23 at 1320  Med List Status: <None>   Medication Order Taking? Sig Documenting Provider Last Dose Status Informant  acetaminophen (TYLENOL) 500 MG tablet [id number] Yes Take 1,000 mg by mouth every 6 (six) hours as needed for moderate pain or headache. [provider] Taking Active Self, Pharmacy Records  amoxicillin-clavulanate (AUGMENTIN) 500-125 MG tablet [id number] Yes Take 1 tablet by mouth 2 (two) times daily. Joseph Art, DO Taking Active   Bromfenac Sodium (PROLENSA) 0.07 % SOLN [id number] Yes Place 1 drop into both eyes 4 (four) times daily. [name], MD Taking Active Self, Pharmacy Records  cinacalcet [facility]) 60 MG tablet [id number] Yes Take 60 mg by mouth every Monday, Wednesday, and Friday. [provider] Taking Active Self, Pharmacy Records  doxycycline (VIBRA-TABS) 100 MG tablet [id number] Yes Take 1 tablet (100 mg total) by mouth  every 12 (twelve) hours. Joseph Art, DO Taking Active   insulin aspart (NOVOLOG FLEXPEN) 100 UNIT/ML FlexPen [id number] Yes 7 units with breakfast and lunch, and 8 units with supper CF NovoLog(BG -130/40)  Max daily 45 units  Patient taking differently: Inject 9-10 Units into the skin 3 (three) times daily with meals.   Shamleffer, Konrad Dolores, MD Taking Active Self, Pharmacy Records  insulin degludec [facility]) 100 UNIT/ML FlexTouch Pen [id number] Yes Inject 22 Units into the skin at bedtime.  Patient taking differently: Inject 21 Units into the skin at bedtime.   Shamleffer, Konrad Dolores, MD Taking Active Self, Pharmacy Records  prednisoLONE acetate (PRED FORTE) 1 % ophthalmic suspension [id number] Yes PLACE (1) DROP INTO BOTH EYES FOUR TIMES DAILY [name], MD Taking Active Self, Pharmacy Records  sucroferric oxyhydroxide [facility]) 500 MG chewable tablet [id number] Yes Chew 1,000 mg by mouth 3 (three) times daily with meals. [provider] Taking Active Self, Pharmacy Records            Home Care and Equipment/Supplies: Were Home Health Services Ordered?: No Any new equipment or medical supplies ordered?: No  Functional Questionnaire: Do you need assistance with bathing/showering or dressing?: No Do you need assistance with meal preparation?: No Do you need assistance with eating?: No Do you have difficulty maintaining continence: No Do you need assistance with getting out of bed/getting out of a chair/moving?: No Do you have difficulty managing or taking your medications?: No  Follow up appointments reviewed: PCP Follow-up appointment confirmed?: No MD Provider Line Number:[phone] Given: No (Patient to call, has dialysis M-W-F) Specialist Hospital Follow-up appointment confirmed?: Yes Date of Specialist follow-up appointment?: 07/07/23 Follow-Up Specialty Provider:: Dr. [name] Do you need transportation to your follow-up appointment?: No  Do  you understand care options if your condition(s) worsen?: Yes-patient verbalized understanding  SDOH Interventions Today    Flowsheet Row Most Recent Value  SDOH Interventions   Food Insecurity Interventions Intervention Not Indicated  Housing Interventions Intervention Not Indicated  Transportation Interventions Intervention Not Indicated     Patient has longitudinal Case Manager, Demetrios Loll, RN. We discussed her continuing to follow patient and made her aware of his recent admission. She will contact patient next week and care guide to let patient know of appt. Change.  Jodelle Gross RN, BSN, CCM RN Care Manager  Transitions of Care  [facility] - [facility]  [phone]

## 2023-06-18 ENCOUNTER — Telehealth: Payer: Self-pay | Admitting: Orthopedic Surgery

## 2023-06-18 NOTE — Telephone Encounter (Signed)
Pt was seen at [facility] and was advised to follow with Lajoyce Corners in 1 week for Diabetic foot infection offered pt Erin appt on 11/12 at 8:15 and he advised it was too early he would not have a ride and he needs a Tuesday or Thursday later in the afternoon, being his wife is a Runner, broadcasting/film/video and he had dialysis on MWF

## 2023-06-18 NOTE — Telephone Encounter (Signed)
Can you please call pt and offer appt Tues or Thurs of next week in the afternoon and let me know what time and I will sch? Thanks!

## 2023-06-23 ENCOUNTER — Ambulatory Visit (INDEPENDENT_AMBULATORY_CARE_PROVIDER_SITE_OTHER): Payer: Medicare Other | Admitting: Orthopedic Surgery

## 2023-06-23 DIAGNOSIS — I96 Gangrene, not elsewhere classified: Secondary | ICD-10-CM | POA: Diagnosis not present

## 2023-06-26 ENCOUNTER — Encounter: Payer: Self-pay | Admitting: Orthopedic Surgery

## 2023-06-26 NOTE — Progress Notes (Signed)
Office Visit Note   Patient: Randy Oneal           Date of Birth: 06/01/1966           MRN: [id number] Visit Date: 06/23/2023              Requested by: [name], MD [address] [place],  [place] [address] PCP: [name], MD  Chief Complaint  Patient presents with   Right Foot - Follow-up    ER f/u Dm foot ulcers   Left Foot - Follow-up      HPI: Patient is a 57 year old gentleman who is seen for initial evaluation for gangrenous changes toes of both feet.  Patient states that he recently went to the emergency room with blisters on both feet since October.  Patient states he is currently on amoxicillin and doxycycline and Vashe wound cleansing.  Patient's last hemoglobin A1c was 8.0 he is on dialysis Monday Wednesday Friday.  Patient states that he has had ankle-brachial indices at [facility].  Patient states that he is on a transplant list for kidney and pancreas.  Assessment & Plan: Visit Diagnoses:  1. 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  Patient will continue his current care.  Discussed that patient would need surgical intervention prior to transplant surgery.  Discussed most likely he would need a bilateral transmetatarsal amputation.  Follow-Up Instructions: Return in about 2 weeks (around 07/07/2023).   Ortho Exam  Patient is alert, oriented, no adenopathy, well-dressed, normal affect, normal respiratory effort. Examination patient has swelling to both lower extremities by Doppler patient has a strong biphasic dopplerable pulse bilaterally.  There is ischemic gangrenous changes to all the toes on both feet.  This is most consistent with microvascular disease.  Patient has a large ulcer right great toe.  After informed consent a 10 blade knife was used to debride the skin and soft tissue back to healthy viable tissue.  The ulcer is 3 cm in diameter 1 mm deep after debridement.  Imaging: No results  found. No images are attached to the encounter.  Labs: Lab Results  Component Value Date   HGBA1C 8.0 (A) 01/13/2023   HGBA1C >15.0 08/27/2022   HGBA1C 8.5 (A) 10/10/2020   ESRSEDRATE 110 (H) 06/12/2023   ESRSEDRATE 81 (H) 08/17/2018   CRP 11.3 (H) 06/12/2023   CRP 30.0 (H) 08/17/2018   REPTSTATUS 06/17/2023 FINAL 06/12/2023   GRAMSTAIN  08/27/2018    ABUNDANT WBC PRESENT, PREDOMINANTLY PMN NO ORGANISMS SEEN    CULT  06/12/2023    NO GROWTH 5 DAYS Performed at [facility], [address], [place], [place] [address]    LABORGA GROUP B STREP(S.AGALACTIAE)ISOLATED 08/17/2018     Lab Results  Component Value Date   ALBUMIN 3.3 (L) 06/14/2023   ALBUMIN 3.6 04/19/2023   ALBUMIN 2.8 (L) 12/08/2019   PREALBUMIN <5.0 (L) 08/17/2018    Lab Results  Component Value Date   MG 2.3 06/13/2023   MG 3.1 (H) 08/16/2018   Lab Results  Component Value Date   VD25OH 8.0 (L) 02/04/2018    Lab Results  Component Value Date   PREALBUMIN <5.0 (L) 08/17/2018      Latest Ref Rng & Units 06/15/2023    7:14 AM 06/13/2023    4:02 AM 06/12/2023   12:30 PM  CBC EXTENDED  WBC 4.0 - 10.5 K/uL 6.0  7.2  5.2  RBC 4.22 - 5.81 MIL/uL 4.19  3.90  4.40   Hemoglobin 13.0 - 17.0 g/dL 40.9  81.1  91.4   HCT 39.0 - 52.0 % 36.4  34.2  39.2   Platelets 150 - 400 K/uL 220  196  197   NEUT# 1.7 - 7.7 K/uL   3.6   Lymph# 0.7 - 4.0 K/uL   1.1      There is no height or weight on file to calculate BMI.  Orders:  No orders of the defined types were placed in this encounter.  No orders of the defined types were placed in this encounter.    Procedures: No procedures performed  Clinical Data: No additional findings.  ROS:  All other systems negative, except as noted in the HPI. Review of Systems  Objective: Vital Signs: There were no vitals taken for this visit.  Specialty Comments:  No specialty comments available.  PMFS History: Patient Active Problem List   Diagnosis Date Noted    Diabetic foot infection (HCC) 06/12/2023   Diabetic ulcer of right midfoot associated with diabetes mellitus due to underlying condition, with fat layer exposed (HCC) 04/17/2022   COVID-19 10/23/2021   Other fluid overload 07/30/2021   Nausea 05/22/2021   Peripheral neuropathy 12/19/2020   Type 2 diabetes mellitus with retinopathy, with long-term current use of insulin (HCC) 07/12/2020   Type 2 diabetes mellitus with chronic kidney disease on chronic dialysis, with long-term current use of insulin (HCC) 07/12/2020   Type 2 diabetes mellitus with diabetic neuropathy, with long-term current use of insulin (HCC) 07/12/2020   Bilateral retinal detachment 07/02/2020   Blurry vision, bilateral 07/02/2020   Cataract 07/02/2020   ESRD on hemodialysis (HCC) 07/02/2020   Gastroesophageal reflux disease without esophagitis 07/02/2020   History of Bell's palsy 07/02/2020   Hyperlipidemia 07/02/2020   Personal history of septic arthritis 07/02/2020   Hypercalcemia 03/05/2020   Allergy, unspecified, initial encounter 02/29/2020   Anaphylactic shock, unspecified, initial encounter 02/29/2020   Hypothyroidism, unspecified 12/10/2019   Coagulation defect, unspecified (HCC) 12/09/2019   Diarrhea, unspecified 12/09/2019   Dyspnea, unspecified 12/09/2019   Iron deficiency anemia, unspecified 12/09/2019   Pain, unspecified 12/09/2019   Pruritus, unspecified 12/09/2019   Secondary hyperparathyroidism of renal origin (HCC) 12/09/2019   Displacement of vascular dialysis catheter (HCC)    Acute on chronic renal failure (HCC) 12/01/2019   End stage kidney disease (HCC)    Skin callus 08/03/2019   Pain due to onychomycosis of toenails of both feet 04/20/2019   Posterior tibial tendon dysfunction (PTTD) of both lower extremities 04/20/2019   Diabetic neuropathy (HCC) 04/20/2019   Diabetic retinopathy of both eyes (HCC) 01/31/2019   Type 2 diabetes mellitus with hyperglycemia, with long-term current use of  insulin (HCC) 10/20/2018   Sepsis due to Streptococcus, group B (HCC) 10/05/2018   Dietary counseling and surveillance 09/15/2018   Non compliance with medical treatment 09/15/2018   Open wound of right great toe    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/27/2018   Cellulitis of great toe, right    Diabetic hyperosmolar non-ketotic state (HCC) 08/17/2018   Hyperglycemia 08/17/2018   AKI (acute kidney injury) (HCC) 08/16/2018   Hyponatremia 08/16/2018   Hyperlipidemia associated with type 2 diabetes mellitus (HCC) 03/04/2017   Erectile dysfunction 03/04/2017   Personal history of noncompliance with medical treatment, presenting hazards to health 11/22/2015   Essential hypertension 01/30/2014   Loss of weight 01/30/2014   Type 2 diabetes mellitus not at goal [facility]) 03/30/2013  Past Medical History:  Diagnosis Date   Anemia    Cataract 07/02/2020   Diabetes mellitus    Type 2    Diabetic retinopathy of both eyes (HCC) 01/31/2019   [name] 01/2019   ESRD (end stage renal disease) (HCC)    [place] Frenisus   Herpes simplex virus (HSV) infection    OU   Hypertension    Hypertensive retinopathy    OU   Retinal detachment    OS   Sepsis due to Streptococcus, group B (HCC) 10/05/2018    Family History  Problem Relation Age of Onset   Hypertension Mother    Colon cancer Neg Hx     Past Surgical History:  Procedure Laterality Date   25 GAUGE PARS PLANA VITRECTOMY WITH 20 GAUGE MVR PORT FOR MACULAR HOLE Right 02/24/2019   Procedure: 25 GAUGE PARS PLANA VITRECTOMY WITH 20 GAUGE MVR PORT FOR MACULAR HOLE;  Surgeon: [name], MD;  Location: [facility] OR;  Service: Ophthalmology;  Laterality: Right;   APPLICATION OF WOUND VAC Left 08/27/2018   Procedure: APPLICATION OF WOUND VAC, left neck;  Surgeon: [name], MD;  Location: [facility] OR;  Service: Thoracic;  Laterality: Left;   APPLICATION OF WOUND VAC Left 08/30/2018   Procedure: APPLICATION OF WOUND VAC;  Surgeon: [name], MD;   Location: [facility] OR;  Service: Thoracic;  Laterality: Left;   AV FISTULA PLACEMENT Left 02/15/2020   Procedure: LEFT ARM ARTERIOVENOUS (AV) FISTULA CREATION;  Surgeon: [name], MD;  Location: [facility] OR;  Service: Vascular;  Laterality: Left;   CATARACT EXTRACTION Bilateral    CATARACT EXTRACTION W/ INTRAOCULAR LENS  IMPLANT, BILATERAL     COLONOSCOPY  06/24/2011   Procedure: COLONOSCOPY;  Surgeon: [name], MD;  Location: [facility] SUITE;  Service: Endoscopy;  Laterality: N/A;  8:30 AM   EYE SURGERY     GAS/FLUID EXCHANGE Left 03/31/2019   Procedure: Gas/Fluid Exchange;  Surgeon: [name], MD;  Location: [facility] OR;  Service: Ophthalmology;  Laterality: Left;   INJECTION OF SILICONE OIL  02/24/2019   Procedure: Injection Of Silicone Oil;  Surgeon: [name], MD;  Location: [facility] OR;  Service: Ophthalmology;;   INSERTION OF DIALYSIS CATHETER Right 12/05/2019   Procedure: INSERTION OF DIALYSIS CATHETER RIGHT SUBCLAVIAN;  Surgeon: [name], MD;  Location: [facility] ORS;  Service: General;  Laterality: Right;   INSERTION OF DIALYSIS CATHETER Right 12/07/2019   Procedure: Minor Dialysis catheter in place- need to reposition/ adjust catheter to help with flows;  Surgeon: [name], MD;  Location: [facility] ORS;  Service: General;  Laterality: Right;  procedure room case with 1% lidocaine, full sterile drape,  towels, full gown, large chloraprep, 4-0 Monocryl and dermabond    INSERTION OF DIALYSIS CATHETER Right 12/08/2019   Procedure: INSERTION OF DIALYSIS CATHETER EXCHANGE;  Surgeon: [name], MD;  Location: [facility] ORS;  Service: General;  Laterality: Right;   IR ANGIOGRAM EXTREMITY RIGHT  03/15/2021   IR RADIOLOGIST EVAL & MGMT  02/07/2021   IR RADIOLOGIST EVAL & MGMT  05/09/2021   IR US GUIDE VASC ACCESS RIGHT  03/15/2021   MEMBRANE PEEL Right 02/24/2019   Procedure: [place] Flax;  Surgeon: [name], MD;  Location: [facility] OR;  Service: Ophthalmology;  Laterality: Right;   PARS  PLANA VITRECTOMY Left 03/31/2019   Procedure: PARS PLANA VITRECTOMY WITH 25 GAUGE WITH MEMBRANE PEEL;  Surgeon: [name], MD;  Location: [facility] OR;  Service: Ophthalmology;  Laterality: Left;  PHOTOCOAGULATION WITH LASER Right 02/24/2019   Procedure: Photocoagulation With Laser;  Surgeon: [name], MD;  Location: [facility] OR;  Service: Ophthalmology;  Laterality: Right;   PHOTOCOAGULATION WITH LASER Left 03/31/2019   Procedure: Photocoagulation With Laser;  Surgeon: [name], MD;  Location: [facility] OR;  Service: Ophthalmology;  Laterality: Left;   RETINAL DETACHMENT SURGERY Left 03/31/2019   TRD Repair - Dr. [name]   SILICON OIL REMOVAL Right 03/31/2019   Procedure: Silicon Oil Removal;  Surgeon: [name], MD;  Location: [facility] OR;  Service: Ophthalmology;  Laterality: Right;   STERNAL WOUND DEBRIDEMENT Left 08/27/2018   Procedure: Incision and DEBRIDEMENT Left Chest, Neck and Mediastinum;  Surgeon: [name], MD;  Location: [facility] OR;  Service: Thoracic;  Laterality: Left;   STERNAL WOUND DEBRIDEMENT Left 08/30/2018   Procedure: WOUND VAC CHANGE, LEFT CHEST AND NECK, POSSIBLE DEBRIDEMENT;  Surgeon: [name], MD;  Location: [facility] OR;  Service: Thoracic;  Laterality: Left;   Social History   Occupational History   Not on file  Tobacco Use   Smoking status: Never   Smokeless tobacco: Never  Vaping Use   Vaping status: Never Used  Substance and Sexual Activity   Alcohol use: No   Drug use: No   Sexual activity: Yes

## 2023-07-02 ENCOUNTER — Encounter: Payer: Self-pay | Admitting: *Deleted

## 2023-07-02 ENCOUNTER — Telehealth: Payer: Self-pay | Admitting: *Deleted

## 2023-07-02 NOTE — Progress Notes (Signed)
  Care Coordination Note  07/02/2023 Name: Randy Oneal MRN: [id number] DOB: Jun 13, 1966  Randy Oneal is a 57 y.o. year old male who is a primary care patient of Luking, Jonna Coup, MD and is actively engaged with the care management team. I reached out to Randy Oneal by phone today to assist with re-scheduling a follow up visit with the RN Case Manager  Follow up plan: Telephone appointment with care management team member scheduled for:11/25  [facility] Coordination Care Guide  Direct Dial: [phone]

## 2023-07-02 NOTE — Progress Notes (Signed)
[facility] - Clinic Note  07/08/2023    CHIEF COMPLAINT Patient presents for Retina Follow Up  HISTORY OF PRESENT ILLNESS: Randy Oneal is a 57 y.o. male who presents to the clinic today for:  HPI     Retina Follow Up   Patient presents with  Diabetic Retinopathy.  In both eyes.  This started 6 weeks ago.  Duration of 6 weeks.  Since onset it is stable.  I, the attending physician,  performed the HPI with the patient and updated documentation appropriately.        Comments   6 week retina follow up PDR OU and IVE OD IVV OS pt is reporting no vision changes noticed he denies any flashes some floaters his last reading 200 this am       Last edited by [name], MD on 07/08/2023 12:02 PM.    Pt states vision is stable  Referring physician: [name], MD [address] [place],  [place] [address]  HISTORICAL INFORMATION:   Selected notes from the MEDICAL RECORD NUMBER Referred by Dr.[name] for concern of decreased vision post cataract sx   CURRENT MEDICATIONS: Current Outpatient Medications (Ophthalmic Drugs)  Medication Sig   Bromfenac Sodium (PROLENSA) 0.07 % SOLN Place 1 drop into both eyes 4 (four) times daily.   prednisoLONE acetate (PRED FORTE) 1 % ophthalmic suspension PLACE (1) DROP INTO BOTH EYES FOUR TIMES DAILY   No current facility-administered medications for this visit. (Ophthalmic Drugs)   Current Outpatient Medications (Other)  Medication Sig   acetaminophen (TYLENOL) 500 MG tablet Take 1,000 mg by mouth every 6 (six) hours as needed for moderate pain or headache.   amoxicillin-clavulanate (AUGMENTIN) 500-125 MG tablet Take 1 tablet by mouth 2 (two) times daily.   cinacalcet (SENSIPAR) 60 MG tablet Take 60 mg by mouth every Monday, Wednesday, and Friday.   doxycycline (VIBRA-TABS) 100 MG tablet Take 1 tablet (100 mg total) by mouth every 12 (twelve) hours.   insulin aspart (NOVOLOG FLEXPEN) 100 UNIT/ML  FlexPen 7 units with breakfast and lunch, and 8 units with supper CF NovoLog(BG -130/40)  Max daily 45 units (Patient taking differently: Inject 9-10 Units into the skin 3 (three) times daily with meals.)   insulin degludec (TRESIBA FLEXTOUCH) 100 UNIT/ML FlexTouch Pen Inject 22 Units into the skin at bedtime. (Patient taking differently: Inject 21 Units into the skin at bedtime.)   sucroferric oxyhydroxide (VELPHORO) 500 MG chewable tablet Chew 1,000 mg by mouth 3 (three) times daily with meals.   No current facility-administered medications for this visit. (Other)   REVIEW OF SYSTEMS: ROS   Positive for: Genitourinary, Endocrine, Eyes Negative for: Constitutional, Gastrointestinal, Neurological, Skin, Musculoskeletal, HENT, Cardiovascular, Respiratory, Psychiatric, Allergic/Imm, Heme/Lymph Last edited by Etheleen Mayhew, COT on 07/08/2023  9:27 AM.       ALLERGIES Allergies  Allergen Reactions   Tramadol Nausea And Vomiting   PAST MEDICAL HISTORY Past Medical History:  Diagnosis Date   Anemia    Cataract 07/02/2020   Diabetes mellitus    Type 2    Diabetic retinopathy of both eyes (HCC) 01/31/2019   [name] 01/2019   ESRD (end stage renal disease) (HCC)    [place] Frenisus   Herpes simplex virus (HSV) infection    OU   Hypertension    Hypertensive retinopathy    OU   Retinal detachment    OS   Sepsis due to Streptococcus, group B (HCC) 10/05/2018  Past Surgical History:  Procedure Laterality Date   25 GAUGE PARS PLANA VITRECTOMY WITH 20 GAUGE MVR PORT FOR MACULAR HOLE Right 02/24/2019   Procedure: 25 GAUGE PARS PLANA VITRECTOMY WITH 20 GAUGE MVR PORT FOR MACULAR HOLE;  Surgeon: [name], MD;  Location: [facility] OR;  Service: Ophthalmology;  Laterality: Right;   APPLICATION OF WOUND VAC Left 08/27/2018   Procedure: APPLICATION OF WOUND VAC, left neck;  Surgeon: [name], MD;  Location: [facility] OR;  Service: Thoracic;  Laterality: Left;   APPLICATION OF WOUND  VAC Left 08/30/2018   Procedure: APPLICATION OF WOUND VAC;  Surgeon: [name], MD;  Location: [facility] OR;  Service: Thoracic;  Laterality: Left;   AV FISTULA PLACEMENT Left 02/15/2020   Procedure: LEFT ARM ARTERIOVENOUS (AV) FISTULA CREATION;  Surgeon: [name], MD;  Location: [facility] OR;  Service: Vascular;  Laterality: Left;   CATARACT EXTRACTION Bilateral    CATARACT EXTRACTION W/ INTRAOCULAR LENS  IMPLANT, BILATERAL     COLONOSCOPY  06/24/2011   Procedure: COLONOSCOPY;  Surgeon: [name], MD;  Location: [facility] SUITE;  Service: Endoscopy;  Laterality: N/A;  8:30 AM   EYE SURGERY     GAS/FLUID EXCHANGE Left 03/31/2019   Procedure: Gas/Fluid Exchange;  Surgeon: [name], MD;  Location: [facility] OR;  Service: Ophthalmology;  Laterality: Left;   INJECTION OF SILICONE OIL  02/24/2019   Procedure: Injection Of Silicone Oil;  Surgeon: [name], MD;  Location: [facility] OR;  Service: Ophthalmology;;   INSERTION OF DIALYSIS CATHETER Right 12/05/2019   Procedure: INSERTION OF DIALYSIS CATHETER RIGHT SUBCLAVIAN;  Surgeon: [name], MD;  Location: [facility] ORS;  Service: General;  Laterality: Right;   INSERTION OF DIALYSIS CATHETER Right 12/07/2019   Procedure: Minor Dialysis catheter in place- need to reposition/ adjust catheter to help with flows;  Surgeon: [name], MD;  Location: [facility] ORS;  Service: General;  Laterality: Right;  procedure room case with 1% lidocaine, full sterile drape,  towels, full gown, large chloraprep, 4-0 Monocryl and dermabond    INSERTION OF DIALYSIS CATHETER Right 12/08/2019   Procedure: INSERTION OF DIALYSIS CATHETER EXCHANGE;  Surgeon: [name], MD;  Location: [facility] ORS;  Service: General;  Laterality: Right;   IR ANGIOGRAM EXTREMITY RIGHT  03/15/2021   IR RADIOLOGIST EVAL & MGMT  02/07/2021   IR RADIOLOGIST EVAL & MGMT  05/09/2021   IR US GUIDE VASC ACCESS RIGHT  03/15/2021   MEMBRANE PEEL Right 02/24/2019   Procedure: [place] Flax;  Surgeon:  [name], MD;  Location: [facility] OR;  Service: Ophthalmology;  Laterality: Right;   PARS PLANA VITRECTOMY Left 03/31/2019   Procedure: PARS PLANA VITRECTOMY WITH 25 GAUGE WITH MEMBRANE PEEL;  Surgeon: [name], MD;  Location: [facility] OR;  Service: Ophthalmology;  Laterality: Left;   PHOTOCOAGULATION WITH LASER Right 02/24/2019   Procedure: Photocoagulation With Laser;  Surgeon: [name], MD;  Location: [facility] OR;  Service: Ophthalmology;  Laterality: Right;   PHOTOCOAGULATION WITH LASER Left 03/31/2019   Procedure: Photocoagulation With Laser;  Surgeon: [name], MD;  Location: [facility] OR;  Service: Ophthalmology;  Laterality: Left;   RETINAL DETACHMENT SURGERY Left 03/31/2019   TRD Repair - Dr. [name]   SILICON OIL REMOVAL Right 03/31/2019   Procedure: Silicon Oil Removal;  Surgeon: [name], MD;  Location: [facility] OR;  Service: Ophthalmology;  Laterality: Right;   STERNAL WOUND DEBRIDEMENT Left 08/27/2018   Procedure: Incision and DEBRIDEMENT Left Chest, Neck and Mediastinum;  Surgeon: [name], MD;  Location: [facility] OR;  Service: Thoracic;  Laterality: Left;   STERNAL WOUND DEBRIDEMENT Left 08/30/2018   Procedure: WOUND VAC CHANGE, LEFT CHEST AND NECK, POSSIBLE DEBRIDEMENT;  Surgeon: [name], MD;  Location: [facility] OR;  Service: Thoracic;  Laterality: Left;   FAMILY HISTORY Family History  Problem Relation Age of Onset   Hypertension Mother    Colon cancer Neg Hx    SOCIAL HISTORY Social History   Tobacco Use   Smoking status: Never   Smokeless tobacco: Never  Vaping Use   Vaping status: Never Used  Substance Use Topics   Alcohol use: No   Drug use: No       OPHTHALMIC EXAM:  Base Eye Exam     Visual Acuity (Snellen - Linear)       Right Left   Dist [place] 20/200 -1 20/200 -2   Dist ph [place] 20/150 -2 20/150 -2         Tonometry (Tonopen, 9:36 AM)       Right Left   Pressure 19 21         Pupils       Pupils Dark Light Shape React APD   Right PERRL 4 3  Round Brisk None   Left PERRL 4 3 Round Brisk None         Visual Fields       Left Right   Restrictions Partial outer superior temporal, inferior temporal, inferior nasal deficiencies Partial outer superior temporal, inferior temporal deficiencies         Extraocular Movement       Right Left    Full, Ortho Full, Ortho         Neuro/Psych     Oriented x3: Yes   Mood/Affect: Normal         Dilation     Both eyes: 2.5% Phenylephrine @ 9:36 AM           Slit Lamp and Fundus Exam     Slit Lamp Exam       Right Left   Lids/Lashes mild Meibomian gland dysfunction mild Meibomian gland dysfunction   Conjunctiva/Sclera subconj silicon oil bubbles, Chemosis, conj Cyst White and quiet   Cornea Trace PEE, Well healed cataract wound, arcus, Debris in tear film Trace PEE, Well healed cataract wound, arcus, Debris in tear film   Anterior Chamber Deep and quiet, no cell or flare Deep and quiet, no cell or flare   Iris slightly irregular dilation, posterior synchiae from 3:00-1200 round, focal Posterior synechiae at 1030   Lens PC IOL in good position, pigment deposition, 1+ PCO, mild pigment deposition on optic PC IOL in good position, mild pigment deposition   Anterior Vitreous Post vit, silicon oil bubble ~85% post vitrectomy, good silicone oil fill         Fundus Exam       Right Left   Disc 2+pallor, sharp rim, mild fibrosis along SN rim +pallor, sharp rim, fine NVD--regressed, mild fibrosis   C/D Ratio 0.2 0.2   Macula attached under oil, scattered MA/IRH -- improved, nasal and central thickening / edema -- slightly improved attached under oil, persistent fibrosis, persistent edema -- slightly improved, focal bands of PRF with traction emanating from ST macula -- stable, focal DBH temporal macula -- improved   Vessels attenuated Attenuated, dilated venules, Tortuous   Periphery Attached, dense 360 PRP laser, mild scattered fibrosis - persistent, Focal ret hole  along distal  IT arcades--good laser surrounding Attached, good 360 PRP laser changes, pre-retinal fibrosis extending to posterior PRP border superior and inferiorly           IMAGING AND PROCEDURES  Imaging and Procedures for @TODAY @  OCT, Retina - OU - Both Eyes       Right Eye Quality was good. Central Foveal Thickness: 602. Progression has improved. Findings include no SRF, abnormal foveal contour, epiretinal membrane, intraretinal fluid, outer retinal atrophy, preretinal fibrosis (Mild interval improvement in central cystic changes).   Left Eye Quality was good. Central Foveal Thickness: 505. Progression has improved. Findings include no SRF, abnormal foveal contour, intraretinal hyper-reflective material, epiretinal membrane, intraretinal fluid, preretinal fibrosis (Interval improvement in central cystic changes).   Notes *Images captured and stored on drive  Diagnosis / Impression:  +DME under silicon oil OU OD: Mild interval improvement in central cystic changes OS: Interval improvement in central cystic changes  Clinical management:  See below  Abbreviations: NFP - Normal foveal profile. CME - cystoid macular edema. PED - pigment epithelial detachment. IRF - intraretinal fluid. SRF - subretinal fluid. EZ - ellipsoid zone. ERM - epiretinal membrane. ORA - outer retinal atrophy. ORT - outer retinal tubulation. SRHM - subretinal hyper-reflective material      Intravitreal Injection, Pharmacologic Agent - OD - Right Eye       Time Out 07/08/2023. 10:30 AM. Confirmed correct patient, procedure, site, and patient consented.   Anesthesia Topical anesthesia was used. Anesthetic medications included Lidocaine 2%, Proparacaine 0.5%.   Procedure Preparation included 5% betadine to ocular surface, eyelid speculum. A (32g) needle was used.   Injection: 2 mg aflibercept 2 MG/0.05ML   Route: Intravitreal, Site: Right Eye   NDC: L6038910, Lot: [id number], Expiration  date: 12/07/2024, Waste: 0 mL   Post-op Post injection exam found visual acuity of at least counting fingers. The patient tolerated the procedure well. There were no complications. The patient received written and verbal post procedure care education. Post injection medications were not given.      Intravitreal Injection, Pharmacologic Agent - OS - Left Eye       Time Out 07/08/2023. 10:31 AM. Confirmed correct patient, procedure, site, and patient consented.   Anesthesia Topical anesthesia was used. Anesthetic medications included Lidocaine 2%, Proparacaine 0.5%.   Procedure Preparation included 5% betadine to ocular surface, eyelid speculum. A (32g) needle was used.   Injection: 6 mg faricimab-svoa 6 MG/0.05ML   Route: Intravitreal, Site: Left Eye   NDC: 53664-403-47, Lot: [id number], Expiration date: 06/10/2024, Waste: 0 mL   Post-op Post injection exam found visual acuity of at least counting fingers. The patient tolerated the procedure well. There were no complications. The patient received written and verbal post procedure care education. Post injection medications were not given.            ASSESSMENT/PLAN:    ICD-10-CM   1. Proliferative diabetic retinopathy of both eyes with macular edema associated with type 2 diabetes mellitus (HCC)  E11.3513 OCT, Retina - OU - Both Eyes    Intravitreal Injection, Pharmacologic Agent - OD - Right Eye    Intravitreal Injection, Pharmacologic Agent - OS - Left Eye    faricimab-svoa (VABYSMO) 6mg /0.40mL intravitreal injection    aflibercept (EYLEA) SOLN 2 mg    2. Long term (current) use of oral hypoglycemic drugs  Z79.84     3. Long-term (current) use of injectable non-insulin antidiabetic drugs  Z79.85     4. Retinal detachment, tractional, bilateral  H33.43  5. Vitreous hemorrhage of both eyes (HCC)  H43.13     6. Essential hypertension  I10     7. Hypertensive retinopathy of both eyes  H35.033     8. Pseudophakia of  both eyes  Z96.1      1-5. Proliferative diabetic retinopathy with DME, TRD, and vitreous hemorrhage OU (OD > OS)  - last A1c: 8.0 on 06.04.24, 9.6 on 11.11.22  - lost to f/u from Jan 2021 to Oct 2021 due to loss of insurance coverage  - pt with complex medical history with hospitalization in Jan-Feb 2020 for bacteremia and abscess  - history of poor glycemic control for years  - s/p IVA OD #1 (06.20.20), #2 (07.01.20), #3 (09.11.20), #4 (10.09.20), #5 (11.06.20), #6 (11.24.21), #7 (12.22.21), #8 (01.19.22)  - s/p IVA OS #1 (06.20.20), #2 (07.01.20), #3 (08.13.20), #4 (09.11.20)  #5 (11.06.20), #6 (11.24.21), #7 (12.22.21), #8 (01.19.22) - s/p IVE OU #1 (12.04.20) - sample; #2 (01.06.21), #3 (02.18.22), #4 (03.18.22), #5 (04.18.22), #6 (05.25.22), #7 (07.01.22), #8 (07.29.22), #9 (09.02.22), #10 (09.30.22), #11 (11.04.22), #12 (12.09.23), #13 (01.13.23), #14 (03.02.23), #15 (04.11.23), #16 (06.01.23), #17 (07.11.23), #18 (09.12.23), #19 (11.14.23) - s/p IVE OD #20 (03.12.24), #21 (04.23.24), #22 (06.04.24), #23 (07.16.24), #24 (08.30.24), #25 (10.15.24) - s/p IVV OS #1 (03.12.24), #2 (04.23.24), #3 (06.04.24), #4 (07.16.24), #5 (08.30.24), #6 (10.15.24)  - s/p STK OS #1 (10.09.20)  - s/p PRP OS (06.10.20)  - s/p PPV/PFC/EL/FAX/silicone oil OD, 07.16.20  - s/p subconj silicon oil removal OD 8.20.20  - s/p PPV/EL/FAX/silicone oil OS, 08.20.20  - BCVA 20/150 OU -- stable             - OCT shows OD: Mild interval improvement in central cystic changes; OS: Interval improvement in central cystic changes at 6 wks  - recommend IVE OD #26 and IVV OS #7 today, 11.27.24 with f/u in 6 weeks  - RBA of procedure discussed, questions answered  - IVV informed consent obtained and signed, 03.12.23 (OU)  - see procedure note             - Eylea benefits investigation begun 01.19.22 -- approved for 2024  - cont topical PF and Prolensa QID OU for possible CME component  - f/u 6 weeks -- DFE/OCT, possible  injection(s)  6,7. Hypertensive retinopathy OU  - discussed importance of tight BP control  - continue to monitor  8. Pseudophakia OU  - s/p CE with IOL (Dr. Nile Riggs)  Ophthalmic Meds Ordered this visit:  Meds ordered this encounter  Medications   faricimab-svoa (VABYSMO) 6mg /0.53mL intravitreal injection   aflibercept (EYLEA) SOLN 2 mg     Return in about 6 weeks (around 08/19/2023) for f/u PDR OU, DFE, OCT.  There are no Patient Instructions on file for this visit.  This document serves as a record of services personally performed by Karie Chimera, MD, PhD. It was created on their behalf by De Blanch, an ophthalmic technician. The creation of this record is the provider's dictation and/or activities during the visit.    Electronically signed by: De Blanch, OA, 07/08/23  12:03 PM  This document serves as a record of services personally performed by Karie Chimera, MD, PhD. It was created on their behalf by Glee Arvin. Manson Passey, OA an ophthalmic technician. The creation of this record is the provider's dictation and/or activities during the visit.    Electronically signed by: Glee Arvin. Manson Passey, OA 07/08/23 12:03 PM  Karie Chimera, M.D., Ph.D. Diseases &  Surgery of the Retina and Vitreous [facility]  I have reviewed the above documentation for accuracy and completeness, and I agree with the above. Karie Chimera, M.D., Ph.D. 07/08/23 12:03 PM  Abbreviations: M myopia (nearsighted); A astigmatism; H hyperopia (farsighted); P presbyopia; Mrx spectacle prescription;  CTL contact lenses; OD right eye; OS left eye; OU both eyes  XT exotropia; ET esotropia; PEK punctate epithelial keratitis; PEE punctate epithelial erosions; DES dry eye syndrome; MGD meibomian gland dysfunction; ATs artificial tears; PFAT's preservative free artificial tears; NSC nuclear sclerotic cataract; PSC posterior subcapsular cataract; ERM epi-retinal membrane; PVD posterior  vitreous detachment; RD retinal detachment; DM diabetes mellitus; DR diabetic retinopathy; NPDR non-proliferative diabetic retinopathy; PDR proliferative diabetic retinopathy; CSME clinically significant macular edema; DME diabetic macular edema; dbh dot blot hemorrhages; CWS cotton wool spot; POAG primary open angle glaucoma; C/D cup-to-disc ratio; HVF humphrey visual field; GVF goldmann visual field; OCT optical coherence tomography; IOP intraocular pressure; BRVO Branch retinal vein occlusion; CRVO central retinal vein occlusion; CRAO central retinal artery occlusion; BRAO branch retinal artery occlusion; RT retinal tear; SB scleral buckle; PPV pars plana vitrectomy; VH Vitreous hemorrhage; PRP panretinal laser photocoagulation; IVK intravitreal kenalog; VMT vitreomacular traction; MH Macular hole;  NVD neovascularization of the disc; NVE neovascularization elsewhere; AREDS age related eye disease study; ARMD age related macular degeneration; POAG primary open angle glaucoma; EBMD epithelial/anterior basement membrane dystrophy; ACIOL anterior chamber intraocular lens; IOL intraocular lens; PCIOL posterior chamber intraocular lens; Phaco/IOL phacoemulsification with intraocular lens placement; PRK photorefractive keratectomy; LASIK laser assisted in situ keratomileusis; HTN hypertension; DM diabetes mellitus; COPD chronic obstructive pulmonary disease

## 2023-07-06 ENCOUNTER — Encounter: Payer: Self-pay | Admitting: *Deleted

## 2023-07-06 ENCOUNTER — Telehealth: Payer: Self-pay | Admitting: *Deleted

## 2023-07-06 NOTE — Progress Notes (Signed)
  Care Coordination Note  07/06/2023 Name: Randy Oneal MRN: [id number] DOB: 06-Jan-1966  Randy Oneal is a 57 y.o. year old male who is a primary care patient of Luking, Jonna Coup, MD and is actively engaged with the care management team. I reached out to Randy Oneal by phone today to assist with re-scheduling a follow up visit with the RN Case Manager  Follow up plan: Unsuccessful telephone outreach attempt made.   [facility]  Care Coordination Care Guide  Direct Dial: [phone]

## 2023-07-07 ENCOUNTER — Encounter (INDEPENDENT_AMBULATORY_CARE_PROVIDER_SITE_OTHER): Payer: Medicare Other | Admitting: Ophthalmology

## 2023-07-07 ENCOUNTER — Ambulatory Visit: Payer: Medicare Other | Admitting: Orthopedic Surgery

## 2023-07-07 DIAGNOSIS — H4313 Vitreous hemorrhage, bilateral: Secondary | ICD-10-CM

## 2023-07-07 DIAGNOSIS — E113513 Type 2 diabetes mellitus with proliferative diabetic retinopathy with macular edema, bilateral: Secondary | ICD-10-CM

## 2023-07-07 DIAGNOSIS — I10 Essential (primary) hypertension: Secondary | ICD-10-CM

## 2023-07-07 DIAGNOSIS — H3343 Traction detachment of retina, bilateral: Secondary | ICD-10-CM

## 2023-07-07 DIAGNOSIS — H35033 Hypertensive retinopathy, bilateral: Secondary | ICD-10-CM

## 2023-07-07 DIAGNOSIS — Z7984 Long term (current) use of oral hypoglycemic drugs: Secondary | ICD-10-CM

## 2023-07-07 DIAGNOSIS — Z961 Presence of intraocular lens: Secondary | ICD-10-CM

## 2023-07-07 DIAGNOSIS — Z7985 Long-term (current) use of injectable non-insulin antidiabetic drugs: Secondary | ICD-10-CM

## 2023-07-08 ENCOUNTER — Ambulatory Visit (INDEPENDENT_AMBULATORY_CARE_PROVIDER_SITE_OTHER): Payer: Medicare Other | Admitting: Ophthalmology

## 2023-07-08 ENCOUNTER — Encounter (INDEPENDENT_AMBULATORY_CARE_PROVIDER_SITE_OTHER): Payer: Self-pay | Admitting: Ophthalmology

## 2023-07-08 DIAGNOSIS — H35033 Hypertensive retinopathy, bilateral: Secondary | ICD-10-CM

## 2023-07-08 DIAGNOSIS — Z961 Presence of intraocular lens: Secondary | ICD-10-CM

## 2023-07-08 DIAGNOSIS — H3343 Traction detachment of retina, bilateral: Secondary | ICD-10-CM | POA: Diagnosis not present

## 2023-07-08 DIAGNOSIS — Z7985 Long-term (current) use of injectable non-insulin antidiabetic drugs: Secondary | ICD-10-CM

## 2023-07-08 DIAGNOSIS — I10 Essential (primary) hypertension: Secondary | ICD-10-CM

## 2023-07-08 DIAGNOSIS — Z7984 Long term (current) use of oral hypoglycemic drugs: Secondary | ICD-10-CM | POA: Diagnosis not present

## 2023-07-08 DIAGNOSIS — E113513 Type 2 diabetes mellitus with proliferative diabetic retinopathy with macular edema, bilateral: Secondary | ICD-10-CM

## 2023-07-08 DIAGNOSIS — H4313 Vitreous hemorrhage, bilateral: Secondary | ICD-10-CM

## 2023-07-08 MED ORDER — FARICIMAB-SVOA 6 MG/0.05ML IZ SOSY
6.0000 mg | PREFILLED_SYRINGE | INTRAVITREAL | Status: AC | PRN
  Administered 2023-07-08: 6 mg via INTRAVITREAL

## 2023-07-08 MED ORDER — AFLIBERCEPT 2MG/0.05ML IZ SOLN FOR KALEIDOSCOPE
2.0000 mg | INTRAVITREAL | Status: AC | PRN
  Administered 2023-07-08: 2 mg via INTRAVITREAL

## 2023-07-20 NOTE — Progress Notes (Signed)
  Care Coordination Note  07/20/2023 Name: Randy Oneal MRN: [id number] DOB: [birth year]  Randy Oneal is a 57 y.o. year old male who is a primary care patient of Luking, Jonna Coup, MD and is actively engaged with the care management team. I reached out to Randy Oneal by phone today to assist with re-scheduling a follow up visit with the RN Case Manager  Follow up plan: Unsuccessful telephone outreach attempt made. A HIPAA compliant phone message was left for the patient providing contact information and requesting a return call.   [facility]  Care Coordination Care Guide  Direct Dial: [phone]

## 2023-07-20 NOTE — Progress Notes (Signed)
  Care Coordination Note  07/20/2023 Name: Hannon Cera Oneal MRN: [id number] DOB: [birth year]  Randy Oneal is a 57 y.o. year old male who is a primary care patient of Luking, Jonna Coup, MD and is actively engaged with the care management team. I reached out to Randy Oneal by phone today to assist with re-scheduling a follow up visit with the RN Case Manager  Follow up plan: Telephone appointment with care management team member scheduled for:07/23/23  [facility] Coordination Care Guide  Direct Dial: [phone]

## 2023-07-21 ENCOUNTER — Ambulatory Visit: Payer: Medicare Other | Admitting: Internal Medicine

## 2023-07-21 ENCOUNTER — Encounter: Payer: Self-pay | Admitting: Internal Medicine

## 2023-07-21 VITALS — BP 122/84 | HR 96 | Ht 71.0 in | Wt 200.4 lb

## 2023-07-21 DIAGNOSIS — E1165 Type 2 diabetes mellitus with hyperglycemia: Secondary | ICD-10-CM | POA: Diagnosis not present

## 2023-07-21 DIAGNOSIS — E11319 Type 2 diabetes mellitus with unspecified diabetic retinopathy without macular edema: Secondary | ICD-10-CM | POA: Diagnosis not present

## 2023-07-21 DIAGNOSIS — E1122 Type 2 diabetes mellitus with diabetic chronic kidney disease: Secondary | ICD-10-CM

## 2023-07-21 DIAGNOSIS — E1142 Type 2 diabetes mellitus with diabetic polyneuropathy: Secondary | ICD-10-CM

## 2023-07-21 DIAGNOSIS — N186 End stage renal disease: Secondary | ICD-10-CM

## 2023-07-21 DIAGNOSIS — Z794 Long term (current) use of insulin: Secondary | ICD-10-CM

## 2023-07-21 DIAGNOSIS — Z992 Dependence on renal dialysis: Secondary | ICD-10-CM

## 2023-07-21 LAB — POCT GLYCOSYLATED HEMOGLOBIN (HGB A1C): Hemoglobin A1C: 8.3 % — AB (ref 4.0–5.6)

## 2023-07-21 NOTE — Patient Instructions (Addendum)
-   Increase Tresiba  24 units at Bedtime - Continue  Novolog 8 units before each meal  - Novolog correctional insulin: ADD extra units on insulin to your meal-time Novolog dose if your blood sugars are higher than 170. Use the scale below to help guide you:   Blood sugar before meal Number of units to inject  Less than 170 0 unit  171 -  210 1 units  211 -  250 2 units  251 -  290 3 units  291 -  330 4 units  331 -  370 5 units       HOW TO TREAT LOW BLOOD SUGARS (Blood sugar LESS THAN 70 MG/DL) Please follow the RULE OF 15 for the treatment of hypoglycemia treatment (when your (blood sugars are less than 70 mg/dL)   STEP 1: Take 15 grams of carbohydrates when your blood sugar is low, which includes:  3-4 GLUCOSE TABS  OR 3-4 OZ OF JUICE OR REGULAR SODA OR ONE TUBE OF GLUCOSE GEL    STEP 2: RECHECK blood sugar in 15 MINUTES STEP 3: If your blood sugar is still low at the 15 minute recheck --> then, go back to STEP 1 and treat AGAIN with another 15 grams of carbohydrates. Decrease Tresiba to 24 units at bedtime Decrease Humalog to 7 units 3 times daily with meals,

## 2023-07-21 NOTE — Progress Notes (Unsigned)
Name: Randy Oneal  Age/ Sex: 57 y.o., male   MRN/ DOB: [id number], 13-Jul-1966     PCP: [name], MD   Reason for Endocrinology Evaluation: Type 2 Diabetes Mellitus  Initial Endocrine Consultative Visit: 10/20/2018    PATIENT IDENTIFIER: Randy Oneal is a 57 y.o. male with a past medical history of T2DM,HTN, and ESRD  . The patient has followed with Endocrinology clinic since  for consultative assistance with management of his diabetes.     DIABETIC HISTORY:  Randy Oneal was diagnosed with DM in 2000.  He has been on Metformin, historically his control has been poor due to non-compliance but pt became motivated after his prolonged hospitalization for chest wall infection in [place], 2020.  He has been on insulin therapy for years. His hemoglobin A1c has ranged from 13.0% in 08/2018, peaking at 16.8% in 2017    On his initial presentation to our clinic his A1c 13.0% and he was on Tresiba and Humalog.   SUBJECTIVE:   During the last visit (01/13/2023): A1c 8.0%   Today (07/21/2023): Randy Oneal is here for a follow up on diabetes.  He is accompanied by his wife today. He checks his blood sugars multiple times daily through Dexcom G7.   He is currently following up with the wound clinic through [facility] for diabetic foot ulcer. 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, receives intravitreal injections  Has been accepted for pancreatic/renal transplant   HD day Monday, Wednesday and Friday   Denies nausea or vomiting  Denies constipation or diarrhea     HOME DIABETES REGIMEN:  Tresiba 22 units daily  Novolog 8 units with each meal CF: NovoLog(BG -130/40)   Statin: Yes ACE-I/ARB: on ESRD    CONTINUOUS GLUCOSE MONITORING RECORD INTERPRETATION    Dates of Recording: 5/22-01/13/2023  Sensor description:dexcom  Results statistics:   CGM use % of time 93  Average and SD 187/64  Time in range      53  %  % Time Above 180  31  % Time above 250 15  % Time Below target <1   Glycemic patterns summary: BG's are optimal at night and fluctuate during the day  Hyperglycemic episodes postprandial  Hypoglycemic episodes occurred where after bolus  Overnight periods: Stable     DIABETIC COMPLICATIONS: Microvascular complications:  Retinopathy, neuropathy, ESRD Last Eye Exam: Completed 05/26/2023  Macrovascular complications:   Denies: CAD, CVA, PVD   HISTORY:  Past Medical History:  Past Medical History:  Diagnosis Date   Anemia    Cataract 07/02/2020   Diabetes mellitus    Type 2    Diabetic retinopathy of both eyes (HCC) 01/31/2019   [name] 01/2019   ESRD (end stage renal disease) (HCC)    [place] Frenis[place]   Herpes simplex virus (HSV) infection    OU   Hypertension    Hypertensive retinopathy    OU   Retinal detachment    OS   Sepsis due to Streptococcus, group B (HCC) 10/05/2018   Past Surgical History:  Past Surgical History:  Procedure Laterality Date   25 GAUGE PARS PLANA VITRECTOMY WITH 20 GAUGE MVR PORT FOR MACULAR HOLE Right 02/24/2019   Procedure: 25 GAUGE PARS PLANA VITRECTOMY WITH 20 GAUGE MVR PORT FOR MACULAR HOLE;  Surgeon: [name], MD;  Location: [facility] OR;  Service: Ophthalmology;  Laterality: Right;   APPLICATION OF WOUND VAC Left 08/27/2018   Procedure: APPLICATION OF WOUND VAC, left  neck;  Surgeon: [name], MD;  Location: [facility] OR;  Service: Thoracic;  Laterality: Left;   APPLICATION OF WOUND VAC Left 08/30/2018   Procedure: APPLICATION OF WOUND VAC;  Surgeon: [name], MD;  Location: [facility] OR;  Service: Thoracic;  Laterality: Left;   AV FISTULA PLACEMENT Left 02/15/2020   Procedure: LEFT ARM ARTERIOVENOUS (AV) FISTULA CREATION;  Surgeon: [name], MD;  Location: [facility] OR;  Service: Vascular;  Laterality: Left;   CATARACT EXTRACTION Bilateral    CATARACT EXTRACTION W/ INTRAOCULAR LENS  IMPLANT, BILATERAL     COLONOSCOPY  06/24/2011   Procedure:  COLONOSCOPY;  Surgeon: [name], MD;  [facility];  Service: Endoscopy;  Laterality: N/A;  8:30 AM   EYE SURGERY     GAS/FLUID EXCHANGE Left 03/31/2019   Procedure: Gas/Fluid Exchange;  Surgeon: [name], MD;  Location: [facility] OR;  Service: Ophthalmology;  Laterality: Left;   INJECTION OF SILICONE OIL  02/24/2019   Procedure: Injection Of Silicone Oil;  Surgeon: [name], MD;  Location: [facility] OR;  Service: Ophthalmology;;   INSERTION OF DIALYSIS CATHETER Right 12/05/2019   Procedure: INSERTION OF DIALYSIS CATHETER RIGHT SUBCLAVIAN;  Surgeon: [name], MD;  Location: [facility] ORS;  Service: General;  Laterality: Right;   INSERTION OF DIALYSIS CATHETER Right 12/07/2019   Procedure: Minor Dialysis catheter in place- need to reposition/ adjust catheter to help with flows;  Surgeon: [name], MD;  Location: [facility] ORS;  Service: General;  Laterality: Right;  procedure room case with 1% lidocaine, full sterile drape,  towels, full gown, large chloraprep, 4-0 Monocryl and dermabond    INSERTION OF DIALYSIS CATHETER Right 12/08/2019   Procedure: INSERTION OF DIALYSIS CATHETER EXCHANGE;  Surgeon: [name], MD;  Location: [facility];  Service: General;  Laterality: Right;   IR ANGIOGRAM EXTREMITY RIGHT  03/15/2021   IR RADIOLOGIST EVAL & MGMT  02/07/2021   IR RADIOLOGIST EVAL & MGMT  05/09/2021   IR US GUIDE VASC ACCESS RIGHT  03/15/2021   MEMBRANE PEEL Right 02/24/2019   Procedure: [place]ula Flax;  Surgeon: [name], MD;  Location: [facility] OR;  Service: Ophthalmology;  Laterality: Right;   PARS PLANA VITRECTOMY Left 03/31/2019   Procedure: PARS PLANA VITRECTOMY WITH 25 GAUGE WITH MEMBRANE PEEL;  Surgeon: [name], MD;  Location: [facility] OR;  Service: Ophthalmology;  Laterality: Left;   PHOTOCOAGULATION WITH LASER Right 02/24/2019   Procedure: Photocoagulation With Laser;  Surgeon: [name], MD;  Location: [facility] OR;  Service: Ophthalmology;  Laterality: Right;   PHOTOCOAGULATION  WITH LASER Left 03/31/2019   Procedure: Photocoagulation With Laser;  Surgeon: [name], MD;  Location: [facility] OR;  Service: Ophthalmology;  Laterality: Left;   RETINAL DETACHMENT SURGERY Left 03/31/2019   TRD Repair - Dr. [name]   SILICON OIL REMOVAL Right 03/31/2019   Procedure: Silicon Oil Removal;  Surgeon: [name], MD;  Location: [facility] OR;  Service: Ophthalmology;  Laterality: Right;   STERNAL WOUND DEBRIDEMENT Left 08/27/2018   Procedure: Incision and DEBRIDEMENT Left Chest, Neck and Mediastinum;  Surgeon: [name], MD;  Location: [facility] OR;  Service: Thoracic;  Laterality: Left;   STERNAL WOUND DEBRIDEMENT Left 08/30/2018   Procedure: WOUND VAC CHANGE, LEFT CHEST AND NECK, POSSIBLE DEBRIDEMENT;  Surgeon: [name], MD;  Location: [facility] OR;  Service: Thoracic;  Laterality: Left;   Social History:  reports that he has never smoked. He has never used smokeless tobacco. He reports that he  does not drink alcohol and does not use drugs. Family History:  Family History  Problem Relation Age of Onset   Hypertension Mother    Colon cancer Neg Hx      HOME MEDICATIONS: Allergies as of 07/21/2023       Reactions   Tramadol Nausea And Vomiting        Medication List        Accurate as of July 21, 2023  1:57 PM. If you have any questions, ask your nurse or doctor.          acetaminophen 500 MG tablet Commonly known as: TYLENOL Take 1,000 mg by mouth every 6 (six) hours as needed for moderate pain or headache.   amoxicillin-clavulanate 500-125 MG tablet Commonly known as: AUGMENTIN Take 1 tablet by mouth 2 (two) times daily.   Bromfenac Sodium 0.07 % Soln Commonly known as: Prolensa Place 1 drop into both eyes 4 (four) times daily.   cinacalcet 60 MG tablet Commonly known as: SENSIPAR Take 60 mg by mouth every Monday, Wednesday, and Friday.   doxycycline 100 MG tablet Commonly known as: VIBRA-TABS Take 1 tablet (100 mg total) by mouth every 12  (twelve) hours.   lidocaine 2 % jelly Commonly known as: XYLOCAINE Apply topically.   NovoLOG FlexPen 100 UNIT/ML FlexPen Generic drug: insulin aspart 7 units with breakfast and lunch, and 8 units with supper CF NovoLog(BG -130/40)  Max daily 45 units What changed:  how much to take how to take this when to take this additional instructions   prednisoLONE acetate 1 % ophthalmic suspension Commonly known as: PRED FORTE PLACE (1) DROP INTO BOTH EYES FOUR TIMES DAILY   Tresiba FlexTouch 100 UNIT/ML FlexTouch Pen Generic drug: insulin degludec Inject 22 Units into the skin at bedtime. What changed: how much to take   Vashe Cleansing Soln Apply topically.   Velphoro 500 MG chewable tablet Generic drug: sucroferric oxyhydroxide Chew 1,000 mg by mouth 3 (three) times daily with meals.         OBJECTIVE:   Vital Signs: BP 122/84 (BP Location: Left Arm, Patient Position: Sitting, Cuff Size: Small)   Pulse 96   Ht 5\' 11"  (1.803 m)   Wt 200 lb 6.4 oz (90.9 kg)   SpO2 99%   BMI 27.95 kg/m   Wt Readings from Last 3 Encounters:  07/21/23 200 lb 6.4 oz (90.9 kg)  06/15/23 192 lb 7.4 oz (87.3 kg)  04/19/23 198 lb 6.3 oz (90 kg)     Exam: General: Pt appears well and is in NAD  Lungs: Clear with good BS bilat   Heart: RRR   Neuro: MS is good with appropriate affect, pt is alert and Ox3       DATA REVIEWED:  Lab Results  Component Value Date   HGBA1C 8.0 (A) 01/13/2023   HGBA1C >15.0 08/27/2022   HGBA1C 8.5 (A) 10/10/2020   Lab Results  Component Value Date   MICROALBUR 3,744.2 (H) 11/30/2019   LDLCALC 110 (H) 10/22/2020   CREATININE 14.04 (H) 06/15/2023   Lab Results  Component Value Date   MICRALBCREAT 4,825 (H) 11/30/2019     Lab Results  Component Value Date   CHOL 186 10/22/2020   HDL 57 10/22/2020   LDLCALC 110 (H) 10/22/2020   TRIG 103 10/22/2020   CHOLHDL 3.3 10/22/2020       A1c 02/13/2021 A1c 7.9%  ASSESSMENT / PLAN /  RECOMMENDATIONS:   1) Type 2 Diabetes Mellitus, Poorly Controlled ,  With Retinopathic, neuropathic complications and ESRD  - Most recent A1c of 8.3% %. Goal A1c < 7.0 %.    -His A1c    MEDICATIONS: -Continue Tresiba 22  units at Bedtime -Increase NovoLog to 8 units 3 times daily before every meal -Take  CF: NovoLog(BG -130/40) before meals and bedtime    EDUCATION / INSTRUCTIONS: BG monitoring instructions: Patient is instructed to check his blood sugars 3 times a day, before meals. Call [place] [facility] if: BG persistently < 70 I reviewed the Rule of 15 for the treatment of hypoglycemia in detail with the patient. Literature supplied.    2) Diabetic complications:  Eye: Does  have known diabetic retinopathy.  Neuro/ Feet: Does  have known diabetic peripheral neuropathy .  Renal: Patient does ave known baseline CKD. He   is not on an ACEI/ARB at present.     F/U in 6 months    Signed electronically by: Lyndle Herrlich, MD  [facility] Endocrinology  [facility] [address] [place] [place], [place] [address] Phone: [phone] FAX: [phone]   CC: [name], MD [address] [place] [address] Phone: [phone]  Fax: [phone]  Return to Endocrinology clinic as below: Future Appointments  Date Time Provider Department Center  07/23/2023 10:45 AM Gwenith Daily, RN [facility] None  08/14/2023  1:45 PM [name], MD [facility] None

## 2023-07-22 ENCOUNTER — Telehealth: Payer: Self-pay | Admitting: Internal Medicine

## 2023-07-22 DIAGNOSIS — Z794 Long term (current) use of insulin: Secondary | ICD-10-CM

## 2023-07-22 MED ORDER — TRESIBA FLEXTOUCH 100 UNIT/ML ~~LOC~~ SOPN: 24.0000 [IU] | PEN_INJECTOR | Freq: Every day | SUBCUTANEOUS | 2 refills | Status: DC

## 2023-07-22 NOTE — Telephone Encounter (Signed)
I would like the patient to go on the panic pump, please let the patient know that he needs to have labs (Fasting) ... Please schedule the patient    Thanks

## 2023-07-22 NOTE — Telephone Encounter (Signed)
LMTCB

## 2023-07-23 ENCOUNTER — Encounter: Payer: Self-pay | Admitting: *Deleted

## 2023-07-23 ENCOUNTER — Encounter: Payer: Self-pay | Admitting: Internal Medicine

## 2023-07-23 ENCOUNTER — Ambulatory Visit: Payer: Self-pay | Admitting: *Deleted

## 2023-07-23 NOTE — Telephone Encounter (Signed)
LDTVM  

## 2023-07-23 NOTE — Patient Outreach (Signed)
Care Coordination   Health Equity Plan Follow Up Visit Note   07/23/2023 Name: Randy Oneal MRN: [id number] DOB: 29-Sep-1965  Randy Oneal is a 57 y.o. year old male who sees Luking, Jonna Coup, MD for primary care. I spoke with  Randy Oneal by phone today.  What matters to the patients health and wellness today?  Healing of wounds    Goals Addressed             This Visit's Progress    Over the Next 2 Months Patient will see improvements in wound healing (Health Equity Plan)       Care Management Goals: Patient will continue to paint feet with betadine, place gauze between toes, and wrap in conforming gauze Patient will monitor for any new or worsening symptoms and for S/S of infection Patient will continue to wear knee high fracture boots on both feet  Patient will continue to work with home health nurse Patient will keep appointments at [facility] with Wound Care and Vascular on 07/28/23 Patient will work to gain better control of blood sugar and state understanding of blood sugar management in wound healing Patient will reach out to RN Care Manager at [phone] with any resource or care coordination needs     Over the next 6 months, patient will have an A1C of less than 7%   Not on track    Care Coordination Goals: Patient will follow-up endocrinologist every 6 months as recommended Next appointment is 12/01/23 Schedule sooner appointment to discuss implementing pump and adjusting medications Patient will continue to monitor and record blood sugar 3 times per day and as needed with Dexcom 7, and will call endocrinologist with any readings outside of recommended range Patient will continue to follow a carbohydrate modified diet 3 meals per day with 30 grams of carbohydrates and up to 2 snacks per day, if needed, with less than 15 grams of carbohydrates Patient will take Dexcom to provider visits for blood sugar review Patient will reach out to RN Care Coordinator  [phone] with any care coordination or resource needs        SDOH assessments and interventions completed:  Yes  SDOH Interventions Today    Flowsheet Row Most Recent Value  SDOH Interventions   Transportation Interventions Intervention Not Indicated  Stress Interventions Intervention Not Indicated  Health Literacy Interventions Intervention Not Indicated        Care Coordination Interventions:  Yes, provided  Interventions Today    Flowsheet Row Most Recent Value  Chronic Disease   Chronic disease during today's visit Diabetes, Other  [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 Wound Care MD is questioning if he has "covid toes". Hospitalized on 06/12/23 with diabetic toe infection.]  Labs Hgb A1c every 6 months  Doctor Visits Discussed/Reviewed Doctor Visits Discussed, Doctor Visits Reviewed, PCP, Specialist  Durable Medical Equipment (DME) Glucomoter, Other  [wearing knee high fracture boots, Dexcom G7. Blood sugar averaging around 250.]  PCP/Specialist Visits Compliance with follow-up visit  [Appointments with Wound Care MD and Vascular MD at [facility] on 07/28/23. Will also have fasting labs that day. Follow-up with endocrinologist on 12/01/23 for routine visit and sooner to discuss insulin pump and medication adjustments.]  Exercise Interventions   Exercise Discussed/Reviewed Exercise Discussed, Exercise Reviewed  Education Interventions   Education Provided Provided Education  [Stressed importance of optimal blood sugar control for wound healing. Patient is on hold for kidney and pancreatic transplant list until wounds heal.]  Provided Verbal Education On Nutrition, Foot Care, Eye Care, Labs, Blood Sugar  Monitoring, Mental Health/Coping with Illness, Medication, When to see the doctor, [facility], Other  [phone] schedule for RCATs. Will still pickup for dialysis but may need alternate transportation for any appointments from 12/24-1/2. Discussed wound care directions. Painting feet with betadine, gauze between toes, and wrapping in conforming gauze. General overview of "Covid toe" pathophysiology]  Labs Reviewed Hgb A1c  [07/21/23 A1C 8.3%. Up from 8.0% on 01/13/23.]  Mental Health Interventions   Mental Health Discussed/Reviewed Mental Health Discussed, Mental Health Reviewed  [reports that he has support and faith and is coping well at this time.]  Nutrition Interventions   Nutrition Discussed/Reviewed Nutrition Discussed, Nutrition Reviewed, Portion sizes, Decreasing sugar intake, Carbohydrate meal planning, Adding fruits and vegetables, Fluid intake  [3 meals per day with 30 GM of CHO and up to 2 snacks per day, if needed, with less than 15 GM of CHO.]  Pharmacy Interventions   Pharmacy Dicussed/Reviewed Pharmacy Topics Discussed, Pharmacy Topics Reviewed, Medications and their functions  Safety Interventions   Safety Discussed/Reviewed Safety Discussed, Fall Risk, Safety Reviewed, Home Safety  Home Safety Assistive Devices       Follow up plan: Follow up call scheduled for 07/30/23    Encounter Outcome:  Patient Visit Completed   Demetrios Loll, RN, BSN Care Manager [place]  [facility]  Population Health  Direct Dial: [phone] Main #: [phone]

## 2023-07-30 ENCOUNTER — Ambulatory Visit: Payer: Self-pay | Admitting: *Deleted

## 2023-07-30 NOTE — Patient Outreach (Signed)
  Care Coordination   07/30/2023 Name: Randy Oneal MRN: [id number] DOB: 1965/11/08   Care Coordination Outreach Attempts:  An unsuccessful outreach was attempted for an appointment today.  Follow Up Plan:  Additional outreach attempts will be made to offer the patient complex care management information and services.   Encounter Outcome:  No Answer   Care Coordination Interventions:  No, not indicated    Demetrios Loll, RN, BSN Care Manager [place]  [facility]  Population Health  Direct Dial: [phone] Main #: [phone]

## 2023-07-31 ENCOUNTER — Other Ambulatory Visit: Payer: Medicare Other

## 2023-08-01 LAB — C-PEPTIDE: C-Peptide: 1.94 ng/mL (ref 0.80–3.85)

## 2023-08-01 LAB — BASIC METABOLIC PANEL
BUN/Creatinine Ratio: 3 (calc) — ABNORMAL LOW (ref 6–22)
BUN: 23 mg/dL (ref 7–25)
CO2: 36 mmol/L — ABNORMAL HIGH (ref 20–32)
Calcium: 9.9 mg/dL (ref 8.6–10.3)
Chloride: 91 mmol/L — ABNORMAL LOW (ref 98–110)
Creat: 6.7 mg/dL — ABNORMAL HIGH (ref 0.70–1.30)
Glucose, Bld: 75 mg/dL (ref 65–99)
Potassium: 4.7 mmol/L (ref 3.5–5.3)
Sodium: 139 mmol/L (ref 135–146)

## 2023-08-03 NOTE — Progress Notes (Signed)
 [facility] - Clinic Note  08/13/2023    CHIEF COMPLAINT Patient presents for Retina Follow Up  HISTORY OF PRESENT ILLNESS: Randy Oneal is a 57 y.o. male who presents to the clinic today for:  HPI     Retina Follow Up   Patient presents with  Diabetic Retinopathy.  In both eyes.  This started 6 weeks ago.  Duration of 6 weeks.  Since onset it is stable.  I, the attending physician,  performed the HPI with the patient and updated documentation appropriately.        Comments   6 week retina follow up PDR OU and IVE OD IVV OS pt is reporting no vision changes noticed he denies any flashes ir floaters last reading 165      Last edited by [name], MD on 08/13/2023  6:54 PM.     Pt states vision is stable  Referring physician: [name], MD [address] [place],  [place] [address]  HISTORICAL INFORMATION:   Selected notes from the MEDICAL RECORD NUMBER Referred by Dr.Mark Roz for concern of decreased vision post cataract sx   CURRENT MEDICATIONS: Current Outpatient Medications (Ophthalmic Drugs)  Medication Sig   Bromfenac  Sodium (PROLENSA ) 0.07 % SOLN Place 1 drop into both eyes 4 (four) times daily.   prednisoLONE  acetate (PRED FORTE ) 1 % ophthalmic suspension PLACE (1) DROP INTO BOTH EYES FOUR TIMES DAILY   No current facility-administered medications for this visit. (Ophthalmic Drugs)   Current Outpatient Medications (Other)  Medication Sig   acetaminophen  (TYLENOL ) 500 MG tablet Take 1,000 mg by mouth every 6 (six) hours as needed for moderate pain or headache.   amoxicillin -clavulanate (AUGMENTIN ) 500-125 MG tablet Take 1 tablet by mouth 2 (two) times daily. (Patient not taking: Reported on 07/21/2023)   cinacalcet  (SENSIPAR ) 60 MG tablet Take 60 mg by mouth every Monday, Wednesday, and Friday.   doxycycline  (VIBRA -TABS) 100 MG tablet Take 1 tablet (100 mg total) by mouth every 12 (twelve) hours. (Patient not taking:  Reported on 07/21/2023)   insulin  aspart (NOVOLOG  FLEXPEN) 100 UNIT/ML FlexPen 7 units with breakfast and lunch, and 8 units with supper CF NovoLog (BG -130/40)  Max daily 45 units (Patient taking differently: Inject 9-10 Units into the skin 3 (three) times daily with meals.)   insulin  degludec (TRESIBA  FLEXTOUCH) 100 UNIT/ML FlexTouch Pen Inject 24 Units into the skin at bedtime.   lidocaine  (XYLOCAINE ) 2 % jelly Apply topically.   sucroferric oxyhydroxide (VELPHORO ) 500 MG chewable tablet Chew 1,000 mg by mouth 3 (three) times daily with meals.   Wound Cleansers (VASHE CLEANSING) SOLN Apply topically.   No current facility-administered medications for this visit. (Other)   REVIEW OF SYSTEMS: ROS   Positive for: Genitourinary, Endocrine, Eyes Negative for: Constitutional, Gastrointestinal, Neurological, Skin, Musculoskeletal, HENT, Cardiovascular, Respiratory, Psychiatric, Allergic/Imm, Heme/Lymph Last edited by Resa Delon ORN, COT on 08/13/2023  9:55 AM.        ALLERGIES Allergies  Allergen Reactions   Tramadol  Nausea And Vomiting   PAST MEDICAL HISTORY Past Medical History:  Diagnosis Date   Anemia    Cataract 07/02/2020   Diabetes mellitus    Type 2    Diabetic retinopathy of both eyes (HCC) 01/31/2019   Dr Valdemar 01/2019   ESRD (end stage renal disease) (HCC)    [place] Frenisus   Herpes simplex virus (HSV) infection    OU   Hypertension    Hypertensive retinopathy    OU  Retinal detachment    OS   Sepsis due to Streptococcus, group B (HCC) 10/05/2018   Past Surgical History:  Procedure Laterality Date   25 GAUGE PARS PLANA VITRECTOMY WITH 20 GAUGE MVR PORT FOR MACULAR HOLE Right 02/24/2019   Procedure: 25 GAUGE PARS PLANA VITRECTOMY WITH 20 GAUGE MVR PORT FOR MACULAR HOLE;  Surgeon: [name], MD;  Location: [facility] OR;  Service: Ophthalmology;  Laterality: Right;   APPLICATION OF WOUND VAC Left 08/27/2018   Procedure: APPLICATION OF WOUND VAC, left neck;   Surgeon: Lucas Dorise POUR, MD;  Location: [facility] OR;  Service: Thoracic;  Laterality: Left;   APPLICATION OF WOUND VAC Left 08/30/2018   Procedure: APPLICATION OF WOUND VAC;  Surgeon: Lucas Dorise POUR, MD;  Location: [facility] OR;  Service: Thoracic;  Laterality: Left;   AV FISTULA PLACEMENT Left 02/15/2020   Procedure: LEFT ARM ARTERIOVENOUS (AV) FISTULA CREATION;  Surgeon: Sheree Penne Bruckner, MD;  Location: [facility] OR;  Service: Vascular;  Laterality: Left;   CATARACT EXTRACTION Bilateral    CATARACT EXTRACTION W/ INTRAOCULAR LENS  IMPLANT, BILATERAL     COLONOSCOPY  06/24/2011   Procedure: COLONOSCOPY;  Surgeon: Margo CHRISTELLA Haddock, MD;  Location: [facility]ITE;  Service: Endoscopy;  Laterality: N/A;  8:30 AM   EYE SURGERY     GAS/FLUID EXCHANGE Left 03/31/2019   Procedure: Gas/Fluid Exchange;  Surgeon: [name], MD;  Location[facility] OR;  Service: Ophthalmology;  Laterality: Left;   INJECTION OF SILICONE OIL  02/24/2019   Procedure: Injection Of Silicone Oil;  Surgeon: [name], MD;  Location: [facility] OR;  Service: Ophthalmology;;   INSERTION OF DIALYSIS CATHETER Right 12/05/2019   Procedure: INSERTION OF DIALYSIS CATHETER RIGHT SUBCLAVIAN;  Surgeon: Kallie Manuelita BROCKS, MD;  Location: [facility] ORS;  Service: General;  Laterality: Right;   INSERTION OF DIALYSIS CATHETER Right 12/07/2019   Procedure: Minor Dialysis catheter in place- need to reposition/ adjust catheter to help with flows;  Surgeon: Kallie Manuelita BROCKS, MD;  Location: [facility] ORS;  Service: General;  Laterality: Right;  procedure room case with 1% lidocaine , full sterile drape,  towels, full gown, large chloraprep, 4-0 Monocryl and dermabond    INSERTION OF DIALYSIS CATHETER Right 12/08/2019   Procedure: INSERTION OF DIALYSIS CATHETER EXCHANGE;  Surgeon: Kallie Manuelita BROCKS, MD;  Location: [facility] ORS;  Service: General;  Laterality: Right;   IR ANGIOGRAM EXTREMITY RIGHT  03/15/2021   IR RADIOLOGIST EVAL & MGMT  02/07/2021   IR RADIOLOGIST EVAL & MGMT  05/09/2021   IR  US  GUIDE VASC ACCESS RIGHT  03/15/2021   MEMBRANE PEEL Right 02/24/2019   Procedure: Ottie Booty;  Surgeon: [name], MD;  Location: [facility] OR;  Service: Ophthalmology;  Laterality: Right;   PARS PLANA VITRECTOMY Left 03/31/2019   Procedure: PARS PLANA VITRECTOMY WITH 25 GAUGE WITH MEMBRANE PEEL;  Surgeon: [name], MD;  Location: [facility] OR;  Service: Ophthalmology;  Laterality: Left;   PHOTOCOAGULATION WITH LASER Right 02/24/2019   Procedure: Photocoagulation With Laser;  Surgeon: [name], MD;  Location: [facility] OR;  Service: Ophthalmology;  Laterality: Right;   PHOTOCOAGULATION WITH LASER Left 03/31/2019   Procedure: Photocoagulation With Laser;  Surgeon: [name], MD;  Location: [facility] OR;  Service: Ophthalmology;  Laterality: Left;   RETINAL DETACHMENT SURGERY Left 03/31/2019   TRD Repair - Dr. Rogue Valdemar   SILICON OIL REMOVAL Right 03/31/2019   Procedure: Silicon Oil Removal;  Surgeon: [name], MD;  Location: [facility] OR;  Service: Ophthalmology;  Laterality: Right;  STERNAL WOUND DEBRIDEMENT Left 08/27/2018   Procedure: Incision and DEBRIDEMENT Left Chest, Neck and Mediastinum;  Surgeon: Lucas Dorise POUR, MD;  Location: [facility] OR;  Service: Thoracic;  Laterality: Left;   STERNAL WOUND DEBRIDEMENT Left 08/30/2018   Procedure: WOUND VAC CHANGE, LEFT CHEST AND NECK, POSSIBLE DEBRIDEMENT;  Surgeon: Lucas Dorise POUR, MD;  Location: [facility] OR;  Service: Thoracic;  Laterality: Left;   FAMILY HISTORY Family History  Problem Relation Age of Onset   Hypertension Mother    Colon cancer Neg Hx    SOCIAL HISTORY Social History   Tobacco Use   Smoking status: Never   Smokeless tobacco: Never  Vaping Use   Vaping status: Never Used  Substance Use Topics   Alcohol use: No   Drug use: No       OPHTHALMIC EXAM:  Base Eye Exam     Visual Acuity (Snellen - Linear)       Right Left   Dist [place] 20/200 20/200   Dist ph [place] 20/150 -1 20/150 -1         Tonometry (Tonopen, 10:03 AM)       Right  Left   Pressure 14 18         Pupils       Pupils Dark Light Shape React APD   Right PERRL 4 3 Round Brisk None   Left PERRL 4 3 Round Brisk None         Visual Fields       Left Right   Restrictions Partial outer superior temporal, inferior temporal deficiencies Partial outer superior temporal, inferior temporal deficiencies         Extraocular Movement       Right Left    Full, Ortho Full, Ortho         Neuro/Psych     Oriented x3: Yes   Mood/Affect: Normal         Dilation     Both eyes: 2.5% Phenylephrine  @ 10:03 AM           Slit Lamp and Fundus Exam     Slit Lamp Exam       Right Left   Lids/Lashes mild Meibomian gland dysfunction mild Meibomian gland dysfunction   Conjunctiva/Sclera subconj silicon oil bubbles, Chemosis, conj Cyst White and quiet   Cornea Trace PEE, Well healed cataract wound, arcus, Debris in tear film Trace PEE, Well healed cataract wound, arcus, Debris in tear film   Anterior Chamber Deep and quiet, no cell or flare Deep and quiet, no cell or flare   Iris slightly irregular dilation, posterior synchiae from 3:00-1200 round, focal Posterior synechiae at 1030   Lens PC IOL in good position, pigment deposition, 1+ PCO, mild pigment deposition on optic PC IOL in good position, mild pigment deposition   Anterior Vitreous Post vit, silicon oil bubble ~85% post vitrectomy, good silicone oil fill         Fundus Exam       Right Left   Disc 2+pallor, sharp rim, mild fibrosis along SN rim +pallor, sharp rim, fine NVD -- regressed, mild fibrosis   C/D Ratio 0.2 0.2   Macula attached under oil, scattered MA/IRH -- improved, nasal and central thickening / edema attached under oil, persistent fibrosis, persistent edema, focal bands of PRF with traction emanating from ST macula -- stable, focal DBH temporal macula -- improved   Vessels attenuated Attenuated, dilated venules, Tortuous   Periphery Attached, dense 360 PRP laser, mild  scattered fibrosis - persistent, Focal ret hole along distal IT arcades--good laser surrounding Attached, good 360 PRP laser changes, pre-retinal fibrosis extending to posterior PRP border superior and inferiorly           IMAGING AND PROCEDURES  Imaging and Procedures for @TODAY @  OCT, Retina - OU - Both Eyes       Right Eye Quality was good. Central Foveal Thickness: 626. Progression has been stable. Findings include no SRF, abnormal foveal contour, epiretinal membrane, intraretinal fluid, outer retinal atrophy, preretinal fibrosis (Persistent central cystic changes).   Left Eye Quality was good. Central Foveal Thickness: 567. Progression has worsened. Findings include no SRF, abnormal foveal contour, intraretinal hyper-reflective material, epiretinal membrane, intraretinal fluid, preretinal fibrosis (Persistent central cystic changes -- slightly increased).   Notes *Images captured and stored on drive  Diagnosis / Impression:  +DME under silicon oil OU OD: persistent central cystic changes OS: Persistent central cystic changes -- slightly increased  Clinical management:  See below  Abbreviations: NFP - Normal foveal profile. CME - cystoid macular edema. PED - pigment epithelial detachment. IRF - intraretinal fluid. SRF - subretinal fluid. EZ - ellipsoid zone. ERM - epiretinal membrane. ORA - outer retinal atrophy. ORT - outer retinal tubulation. SRHM - subretinal hyper-reflective material      Intravitreal Injection, Pharmacologic Agent - OD - Right Eye       Time Out 08/13/2023. 10:38 AM. Confirmed correct patient, procedure, site, and patient consented.   Anesthesia Topical anesthesia was used. Anesthetic medications included Lidocaine  2%, Proparacaine  0.5%.   Procedure Preparation included 5% betadine to ocular surface, eyelid speculum. A (32g) needle was used.   Injection: 2 mg aflibercept  2 MG/0.05ML   Route: Intravitreal, Site: Right Eye   NDC: Q956576,  Lot: [id number], Expiration date: 12/08/2024, Waste: 0 mL   Post-op Post injection exam found visual acuity of at least counting fingers. The patient tolerated the procedure well. There were no complications. The patient received written and verbal post procedure care education. Post injection medications were not given.      Intravitreal Injection, Pharmacologic Agent - OS - Left Eye       Time Out 08/13/2023. 10:38 AM. Confirmed correct patient, procedure, site, and patient consented.   Anesthesia Topical anesthesia was used. Anesthetic medications included Lidocaine  2%, Proparacaine  0.5%.   Procedure Preparation included 5% betadine to ocular surface, eyelid speculum. A (32g) needle was used.   Injection: 6 mg faricimab -svoa 6 MG/0.05ML   Route: Intravitreal, Site: Left Eye   NDC: 49757-903-93, Lot: [id number], Expiration date: 12/08/2024, Waste: 0 mL   Post-op Post injection exam found visual acuity of at least counting fingers. The patient tolerated the procedure well. There were no complications. The patient received written and verbal post procedure care education. Post injection medications were not given.            ASSESSMENT/PLAN:    ICD-10-CM   1. Proliferative diabetic retinopathy of both eyes with macular edema associated with type 2 diabetes mellitus (HCC)  E11.3513 OCT, Retina - OU - Both Eyes    Intravitreal Injection, Pharmacologic Agent - OD - Right Eye    Intravitreal Injection, Pharmacologic Agent - OS - Left Eye    faricimab -svoa (VABYSMO ) 6mg /0.71mL intravitreal injection    aflibercept  (EYLEA ) SOLN 2 mg    2. Long term (current) use of oral hypoglycemic drugs  Z79.84     3. Long-term (current) use of injectable non-insulin  antidiabetic drugs  Z79.85     4. 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 Vitreous hemorrhage of both eyes (HCC)  H43.13     6. Essential hypertension  I10     7. Hypertensive retinopathy of both eyes  H35.033      8. Pseudophakia of both eyes  Z96.1      1-5. Proliferative diabetic retinopathy with DME, TRD, and vitreous hemorrhage OU (OD > OS)  - last A1c: 8.3 on 12.10.24  - lost to f/u from Jan 2021 to Oct 2021 due to loss of insurance coverage  - pt with complex medical history with hospitalization in Jan-Feb 2020 for bacteremia and abscess  - history of poor glycemic control for years  - s/p IVA OD #1 (06.20.20), #2 (07.01.20), #3 (09.11.20), #4 (10.09.20), #5 (11.06.20), #6 (11.24.21), #7 (12.22.21), #8 (01.19.22)  - s/p IVA OS #1 (06.20.20), #2 (07.01.20), #3 (08.13.20), #4 (09.11.20)  #5 (11.06.20), #6 (11.24.21), #7 (12.22.21), #8 (01.19.22) - s/p IVE OU #1 (12.04.20) - sample; #2 (01.06.21), #3 (02.18.22), #4 (03.18.22), #5 (04.18.22), #6 (05.25.22), #7 (07.01.22), #8 (07.29.22), #9 (09.02.22), #10 (09.30.22), #11 (11.04.22), #12 (12.09.23), #13 (01.13.23), #14 (03.02.23), #15 (04.11.23), #16 (06.01.23), #17 (07.11.23), #18 (09.12.23), #19 (11.14.23) - s/p IVE OD #20 (03.12.24), #21 (04.23.24), #22 (06.04.24), #23 (07.16.24), #24 (08.30.24), #25 (10.15.24), #26 (11.27.24) - s/p IVV OS #1 (03.12.24), #2 (04.23.24), #3 (06.04.24), #4 (07.16.24), #5 (08.30.24), #6 (10.15.24), #7 (11.27.24)  - s/p STK OS #1 (10.09.20)  - s/p PRP OS (06.10.20)  - s/p PPV/PFC/EL/FAX/silicone oil OD, 07.16.20  - s/p subconj silicon oil removal OD 8.20.20  - s/p PPV/EL/FAX/silicone oil OS, 08.20.20  - BCVA 20/150 OU -- stable             - OCT shows OD: persistent central cystic changes; OS: persistent central cystic changes -- slightly increased at 5 wks  - recommend IVE OD #26 and IVV OS #7 today, 11.27.24 with f/u in 5 weeks again  - RBA of procedure discussed, questions answered  - IVV informed consent obtained and signed, 01.02.25 (OU)  - IVE informed consent obtained and singed, 01.02.25  - see procedure note             - Eylea  benefits investigation begun 01.19.22 -- approved for 2025  - cont topical PF  and Prolensa  QID OU for possible CME component  - f/u 5 weeks -- DFE/OCT, possible injection(s)  6,7. Hypertensive retinopathy OU  - discussed importance of tight BP control  - continue to monitor  8. Pseudophakia OU  - s/p CE with IOL [place])  Ophthalmic Meds Ordered this visit:  Meds ordered this encounter  Medications   faricimab -svoa (VABYSMO ) 6mg /0.33mL intravitreal injection   aflibercept  (EYLEA ) SOLN 2 mg     Return in about 5 weeks (around 09/17/2023) for f/u PDR OU, DFE, OCT.  There are no Patient Instructions on file for this visit.  This document serves as a record of services personally performed by Redell JUDITHANN Hans, MD, PhD. It was created on their behalf by Auston Muzzy, COMT. The creation of this record is the provider's dictation and/or activities during the visit.  Electronically signed by: Auston Muzzy, COMT 08/14/23 5:25 PM  This document serves as a record of services personally performed by Redell JUDITHANN Hans, MD, PhD. It was created on their behalf by Alan PARAS. Delores, OA an ophthalmic technician. The creation of this record is the provider's dictation and/or activities during the visit.    Electronically signed by: Alan PARAS. Delores,  OA 08/14/23 5:25 PM  Redell JUDITHANN Hans, M.D., Ph.D. Diseases & Surgery of the Retina and Vitreous [facility]  I have reviewed the above documentation for accuracy and completeness, and I agree with the above. Redell JUDITHANN Hans, M.D., Ph.D. 08/14/23 5:27 PM   Abbreviations: M myopia (nearsighted); A astigmatism; H hyperopia (farsighted); P presbyopia; Mrx spectacle prescription;  CTL contact lenses; OD right eye; OS left eye; OU both eyes  XT exotropia; ET esotropia; PEK punctate epithelial keratitis; PEE punctate epithelial erosions; DES dry eye syndrome; MGD meibomian gland dysfunction; ATs artificial tears; PFAT's preservative free artificial tears; NSC nuclear sclerotic cataract; PSC posterior subcapsular  cataract; ERM epi-retinal membrane; PVD posterior vitreous detachment; RD retinal detachment; DM diabetes mellitus; DR diabetic retinopathy; NPDR non-proliferative diabetic retinopathy; PDR proliferative diabetic retinopathy; CSME clinically significant macular edema; DME diabetic macular edema; dbh dot blot hemorrhages; CWS cotton wool spot; POAG primary open angle glaucoma; C/D cup-to-disc ratio; HVF humphrey visual field; GVF goldmann visual field; OCT optical coherence tomography; IOP intraocular pressure; BRVO Branch retinal vein occlusion; CRVO central retinal vein occlusion; CRAO central retinal artery occlusion; BRAO branch retinal artery occlusion; RT retinal tear; SB scleral buckle; PPV pars plana vitrectomy; VH Vitreous hemorrhage; PRP panretinal laser photocoagulation; IVK intravitreal kenalog ; VMT vitreomacular traction; MH Macular hole;  NVD neovascularization of the disc; NVE neovascularization elsewhere; AREDS age related eye disease study; ARMD age related macular degeneration; POAG primary open angle glaucoma; EBMD epithelial/anterior basement membrane dystrophy; ACIOL anterior chamber intraocular lens; IOL intraocular lens; PCIOL posterior chamber intraocular lens; Phaco/IOL phacoemulsification with intraocular lens placement; PRK photorefractive keratectomy; LASIK laser assisted in situ keratomileusis; HTN hypertension; DM diabetes mellitus; COPD chronic obstructive pulmonary disease

## 2023-08-13 ENCOUNTER — Ambulatory Visit (INDEPENDENT_AMBULATORY_CARE_PROVIDER_SITE_OTHER): Payer: Medicare Other | Admitting: Ophthalmology

## 2023-08-13 ENCOUNTER — Encounter (INDEPENDENT_AMBULATORY_CARE_PROVIDER_SITE_OTHER): Payer: Self-pay | Admitting: Ophthalmology

## 2023-08-13 DIAGNOSIS — E113513 Type 2 diabetes mellitus with proliferative diabetic retinopathy with macular edema, bilateral: Secondary | ICD-10-CM

## 2023-08-13 DIAGNOSIS — H4313 Vitreous hemorrhage, bilateral: Secondary | ICD-10-CM | POA: Diagnosis not present

## 2023-08-13 DIAGNOSIS — Z961 Presence of intraocular lens: Secondary | ICD-10-CM

## 2023-08-13 DIAGNOSIS — Z7985 Long-term (current) use of injectable non-insulin antidiabetic drugs: Secondary | ICD-10-CM | POA: Diagnosis not present

## 2023-08-13 DIAGNOSIS — Z7984 Long term (current) use of oral hypoglycemic drugs: Secondary | ICD-10-CM | POA: Diagnosis not present

## 2023-08-13 DIAGNOSIS — I10 Essential (primary) hypertension: Secondary | ICD-10-CM

## 2023-08-13 DIAGNOSIS — H3343 Traction detachment of retina, bilateral: Secondary | ICD-10-CM | POA: Diagnosis not present

## 2023-08-13 DIAGNOSIS — H35033 Hypertensive retinopathy, bilateral: Secondary | ICD-10-CM

## 2023-08-13 MED ORDER — AFLIBERCEPT 2MG/0.05ML IZ SOLN FOR KALEIDOSCOPE
2.0000 mg | INTRAVITREAL | Status: AC | PRN
  Administered 2023-08-13: 2 mg via INTRAVITREAL

## 2023-08-13 MED ORDER — FARICIMAB-SVOA 6 MG/0.05ML IZ SOSY
6.0000 mg | PREFILLED_SYRINGE | INTRAVITREAL | Status: AC | PRN
  Administered 2023-08-13: 6 mg via INTRAVITREAL

## 2023-08-14 ENCOUNTER — Encounter (INDEPENDENT_AMBULATORY_CARE_PROVIDER_SITE_OTHER): Payer: Medicare Other | Admitting: Ophthalmology

## 2023-09-02 NOTE — Progress Notes (Shared)
[facility] - Clinic Note  09/15/2023    CHIEF COMPLAINT Patient presents for No chief complaint on file.  HISTORY OF PRESENT ILLNESS: Randy Oneal is a 58 y.o. male who presents to the clinic today for:    Pt states vision is stable  Referring physician: [name], MD [address] [place],  [place] [address]  HISTORICAL INFORMATION:   Selected notes from the MEDICAL RECORD NUMBER Referred by Dr.[name] for concern of decreased vision post cataract sx   CURRENT MEDICATIONS: Current Outpatient Medications (Ophthalmic Drugs)  Medication Sig   Bromfenac Sodium (PROLENSA) 0.07 % SOLN Place 1 drop into both eyes 4 (four) times daily.   prednisoLONE acetate (PRED FORTE) 1 % ophthalmic suspension PLACE (1) DROP INTO BOTH EYES FOUR TIMES DAILY   No current facility-administered medications for this visit. (Ophthalmic Drugs)   Current Outpatient Medications (Other)  Medication Sig   acetaminophen (TYLENOL) 500 MG tablet Take 1,000 mg by mouth every 6 (six) hours as needed for moderate pain or headache.   amoxicillin-clavulanate (AUGMENTIN) 500-125 MG tablet Take 1 tablet by mouth 2 (two) times daily. (Patient not taking: Reported on 07/21/2023)   cinacalcet (SENSIPAR) 60 MG tablet Take 60 mg by mouth every Monday, Wednesday, and Friday.   doxycycline (VIBRA-TABS) 100 MG tablet Take 1 tablet (100 mg total) by mouth every 12 (twelve) hours. (Patient not taking: Reported on 07/21/2023)   insulin aspart (NOVOLOG FLEXPEN) 100 UNIT/ML FlexPen 7 units with breakfast and lunch, and 8 units with supper CF NovoLog(BG -130/40)  Max daily 45 units (Patient taking differently: Inject 9-10 Units into the skin 3 (three) times daily with meals.)   insulin degludec (TRESIBA FLEXTOUCH) 100 UNIT/ML FlexTouch Pen Inject 24 Units into the skin at bedtime.   lidocaine (XYLOCAINE) 2 % jelly Apply topically.   sucroferric oxyhydroxide (VELPHORO) 500 MG chewable tablet  Chew 1,000 mg by mouth 3 (three) times daily with meals.   Wound Cleansers (VASHE CLEANSING) SOLN Apply topically.   No current facility-administered medications for this visit. (Other)   REVIEW OF SYSTEMS:      ALLERGIES Allergies  Allergen Reactions   Tramadol Nausea And Vomiting   PAST MEDICAL HISTORY Past Medical History:  Diagnosis Date   Anemia    Cataract 07/02/2020   Diabetes mellitus    Type 2    Diabetic retinopathy of both eyes (HCC) 01/31/2019   [name] 01/2019   ESRD (end stage renal disease) (HCC)    [place] Frenisus   Herpes simplex virus (HSV) infection    OU   Hypertension    Hypertensive retinopathy    OU   Retinal detachment    OS   Sepsis due to Streptococcus, group B (HCC) 10/05/2018   Past Surgical History:  Procedure Laterality Date   25 GAUGE PARS PLANA VITRECTOMY WITH 20 GAUGE MVR PORT FOR MACULAR HOLE Right 02/24/2019   Procedure: 25 GAUGE PARS PLANA VITRECTOMY WITH 20 GAUGE MVR PORT FOR MACULAR HOLE;  Surgeon: [name], MD;  Location: [facility] OR;  Service: Ophthalmology;  Laterality: Right;   APPLICATION OF WOUND VAC Left 08/27/2018   Procedure: APPLICATION OF WOUND VAC, left neck;  Surgeon: [name], MD;  Location: [facility] OR;  Service: Thoracic;  Laterality: Left;   APPLICATION OF WOUND VAC Left 08/30/2018   Procedure: APPLICATION OF WOUND VAC;  Surgeon: [name], MD;  Location: [facility] OR;  Service: Thoracic;  Laterality: Left;   AV  FISTULA PLACEMENT Left 02/15/2020   Procedure: LEFT ARM ARTERIOVENOUS (AV) FISTULA CREATION;  Surgeon: [name], MD;  Location[facility] OR;  Service: Vascular;  Laterality: Left;   CATARACT EXTRACTION Bilateral    CATARACT EXTRACTION W/ INTRAOCULAR LENS  IMPLANT, BILATERAL     COLONOSCOPY  06/24/2011   Procedure: COLONOSCOPY;  Surgeon: [name], MD;  Location: [facility] SUITE;  Service: Endoscopy;  Laterality: N/A;  8:30 AM   EYE SURGERY     GAS/FLUID EXCHANGE Left 03/31/2019   Procedure:  Gas/Fluid Exchange;  Surgeon: [name], MD;  Location: [facility] OR;  Service: Ophthalmology;  Laterality: Left;   INJECTION OF SILICONE OIL  02/24/2019   Procedure: Injection Of Silicone Oil;  Surgeon: [name], MD;  Location: [facility] OR;  Service: Ophthalmology;;   INSERTION OF DIALYSIS CATHETER Right 12/05/2019   Procedure: INSERTION OF DIALYSIS CATHETER RIGHT SUBCLAVIAN;  Surgeon: [name], MD;  Location: [facility] ORS;  Service: General;  Laterality: Right;   INSERTION OF DIALYSIS CATHETER Right 12/07/2019   Procedure: Minor Dialysis catheter in place- need to reposition/ adjust catheter to help with flows;  Surgeon: [name], MD;  Location: [facility] ORS;  Service: General;  Laterality: Right;  procedure room case with 1% lidocaine, full sterile drape,  towels, full gown, large chloraprep, 4-0 Monocryl and dermabond    INSERTION OF DIALYSIS CATHETER Right 12/08/2019   Procedure: INSERTION OF DIALYSIS CATHETER EXCHANGE;  Surgeon: [name], MD;  Location: [facility] ORS;  Service: General;  Laterality: Right;   IR ANGIOGRAM EXTREMITY RIGHT  03/15/2021   IR RADIOLOGIST EVAL & MGMT  02/07/2021   IR RADIOLOGIST EVAL & MGMT  05/09/2021   IR US GUIDE VASC ACCESS RIGHT  03/15/2021   MEMBRANE PEEL Right 02/24/2019   Procedure: [place] Flax;  Surgeon: [name], MD;  Location: [facility] OR;  Service: Ophthalmology;  Laterality: Right;   PARS PLANA VITRECTOMY Left 03/31/2019   Procedure: PARS PLANA VITRECTOMY WITH 25 GAUGE WITH MEMBRANE PEEL;  Surgeon: [name], MD;  Location: [facility] OR;  Service: Ophthalmology;  Laterality: Left;   PHOTOCOAGULATION WITH LASER Right 02/24/2019   Procedure: Photocoagulation With Laser;  Surgeon: [name], MD;  Location: [facility] OR;  Service: Ophthalmology;  Laterality: Right;   PHOTOCOAGULATION WITH LASER Left 03/31/2019   Procedure: Photocoagulation With Laser;  Surgeon: [name], MD;  Location: [facility] OR;  Service: Ophthalmology;  Laterality: Left;   RETINAL DETACHMENT  SURGERY Left 03/31/2019   TRD Repair - Dr. [name]   SILICON OIL REMOVAL Right 03/31/2019   Procedure: Silicon Oil Removal;  Surgeon: [name], MD;  Location: [facility] OR;  Service: Ophthalmology;  Laterality: Right;   STERNAL WOUND DEBRIDEMENT Left 08/27/2018   Procedure: Incision and DEBRIDEMENT Left Chest, Neck and Mediastinum;  Surgeon: [name], MD;  Location: [facility] OR;  Service: Thoracic;  Laterality: Left;   STERNAL WOUND DEBRIDEMENT Left 08/30/2018   Procedure: WOUND VAC CHANGE, LEFT CHEST AND NECK, POSSIBLE DEBRIDEMENT;  Surgeon: [name], MD;  Location: [facility] OR;  Service: Thoracic;  Laterality: Left;   FAMILY HISTORY Family History  Problem Relation Age of Onset   Hypertension Mother    Colon cancer Neg Hx    SOCIAL HISTORY Social History   Tobacco Use   Smoking status: Never   Smokeless tobacco: Never  Vaping Use   Vaping status: Never Used  Substance Use Topics   Alcohol use: No   Drug use: No  OPHTHALMIC EXAM:  Not recorded    IMAGING AND PROCEDURES  Imaging and Procedures for @TODAY @          ASSESSMENT/PLAN:    ICD-10-CM   1. Proliferative diabetic retinopathy of both eyes with macular edema associated with type 2 diabetes mellitus (HCC)  O96.2952     2. Long term (current) use of oral hypoglycemic drugs  Z79.84     3. Long-term (current) use of injectable non-insulin antidiabetic drugs  Z79.85     4. 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 Vitreous hemorrhage of both eyes (HCC)  H43.13     6. Essential hypertension  I10     7. Hypertensive retinopathy of both eyes  H35.033     8. Pseudophakia of both eyes  Z96.1       1-5. Proliferative diabetic retinopathy with DME, TRD, and vitreous hemorrhage OU (OD > OS)  - last A1c: 8.3 on 12.10.24  - lost to f/u from Jan 2021 to Oct 2021 due to loss of insurance coverage  - pt with complex medical history with hospitalization in Jan-Feb 2020 for bacteremia and  abscess  - history of poor glycemic control for years  - s/p IVA OD #1 (06.20.20), #2 (07.01.20), #3 (09.11.20), #4 (10.09.20), #5 (11.06.20), #6 (11.24.21), #7 (12.22.21), #8 (01.19.22)  - s/p IVA OS #1 (06.20.20), #2 (07.01.20), #3 (08.13.20), #4 (09.11.20)  #5 (11.06.20), #6 (11.24.21), #7 (12.22.21), #8 (01.19.22) - s/p IVE OU #1 (12.04.20) - sample; #2 (01.06.21), #3 (02.18.22), #4 (03.18.22), #5 (04.18.22), #6 (05.25.22), #7 (07.01.22), #8 (07.29.22), #9 (09.02.22), #10 (09.30.22), #11 (11.04.22), #12 (12.09.23), #13 (01.13.23), #14 (03.02.23), #15 (04.11.23), #16 (06.01.23), #17 (07.11.23), #18 (09.12.23), #19 (11.14.23) - s/p IVE OD #20 (03.12.24), #21 (04.23.24), #22 (06.04.24), #23 (07.16.24), #24 (08.30.24), #25 (10.15.24), #26 (11.27.24) - s/p IVV OS #1 (03.12.24), #2 (04.23.24), #3 (06.04.24), #4 (07.16.24), #5 (08.30.24), #6 (10.15.24), #7 (11.27.24)  - s/p STK OS #1 (10.09.20)  - s/p PRP OS (06.10.20)  - s/p PPV/PFC/EL/FAX/silicone oil OD, 07.16.20  - s/p subconj silicon oil removal OD 8.20.20  - s/p PPV/EL/FAX/silicone oil OS, 08.20.20  - BCVA 20/150 OU -- stable             - OCT shows OD: persistent central cystic changes; OS: persistent central cystic changes -- slightly increased at 5 wks  - recommend IVE OD #27 and IVV OS #8 today, 02.04.25 with f/u in 5 weeks again  - RBA of procedure discussed, questions answered  - IVV informed consent obtained and signed, 01.02.25 (OU)  - IVE informed consent obtained and singed, 01.02.25  - see procedure note             - [place] benefits investigation begun 01.19.22 -- approved for 2025  - cont topical PF and Prolensa QID OU for possible CME component  - f/u 5 weeks -- DFE/OCT, possible injection(s)  6,7. Hypertensive retinopathy OU  - discussed importance of tight BP control  - continue to monitor  8. Pseudophakia OU  - s/p CE with IOL Nile Riggs)  Ophthalmic Meds Ordered this visit:  No orders of the defined types were placed  in this encounter.    No follow-ups on file.  There are no Patient Instructions on file for this visit.  This document serves as a record of services personally performed by Karie Chimera, MD, PhD. It was created on their behalf by Charlette Caffey, COT an ophthalmic technician. The creation of this  record is the provider's dictation and/or activities during the visit.    Electronically signed by:  Charlette Caffey, COT  09/02/23 7:57 AM   Karie Chimera, M.D., Ph.D. Diseases & Surgery of the Retina and Vitreous [facility]    Abbreviations: M myopia (nearsighted); A astigmatism; H hyperopia (farsighted); P presbyopia; Mrx spectacle prescription;  CTL contact lenses; OD right eye; OS left eye; OU both eyes  XT exotropia; ET esotropia; PEK punctate epithelial keratitis; PEE punctate epithelial erosions; DES dry eye syndrome; MGD meibomian gland dysfunction; ATs artificial tears; PFAT's preservative free artificial tears; NSC nuclear sclerotic cataract; PSC posterior subcapsular cataract; ERM epi-retinal membrane; PVD posterior vitreous detachment; RD retinal detachment; DM diabetes mellitus; DR diabetic retinopathy; NPDR non-proliferative diabetic retinopathy; PDR proliferative diabetic retinopathy; CSME clinically significant macular edema; DME diabetic macular edema; dbh dot blot hemorrhages; CWS cotton wool spot; POAG primary open angle glaucoma; C/D cup-to-disc ratio; HVF humphrey visual field; GVF goldmann visual field; OCT optical coherence tomography; IOP intraocular pressure; BRVO Branch retinal vein occlusion; CRVO central retinal vein occlusion; CRAO central retinal artery occlusion; BRAO branch retinal artery occlusion; RT retinal tear; SB scleral buckle; PPV pars plana vitrectomy; VH Vitreous hemorrhage; PRP panretinal laser photocoagulation; IVK intravitreal kenalog; VMT vitreomacular traction; MH Macular hole;  NVD neovascularization of the disc; NVE  neovascularization elsewhere; AREDS age related eye disease study; ARMD age related macular degeneration; POAG primary open angle glaucoma; EBMD epithelial/anterior basement membrane dystrophy; ACIOL anterior chamber intraocular lens; IOL intraocular lens; PCIOL posterior chamber intraocular lens; Phaco/IOL phacoemulsification with intraocular lens placement; PRK photorefractive keratectomy; LASIK laser assisted in situ keratomileusis; HTN hypertension; DM diabetes mellitus; COPD chronic obstructive pulmonary disease

## 2023-09-03 ENCOUNTER — Telehealth: Payer: Self-pay | Admitting: *Deleted

## 2023-09-03 NOTE — Progress Notes (Unsigned)
Complex Care Management Care Guide Note  09/03/2023 Name: [name] MRN: [id number] DOB: 1966-03-19  Randy Oneal is a 58 y.o. year old male who is a primary care patient of Luking, Jonna Coup, MD and is actively engaged with the care management team. I reached out to Randy Oneal by phone today to assist with re-scheduling  with the RN Case Manager.  Follow up plan: Unsuccessful telephone outreach attempt made. A HIPAA compliant phone message was left for the patient providing contact information and requesting a return call.  Gwenevere Ghazi  [facility]  [facility], [facility] Guide  Direct Dial: [phone]  Fax [phone]

## 2023-09-11 NOTE — Progress Notes (Unsigned)
Complex Care Management Care Guide Note  09/11/2023 Name: Radford Pease III MRN: [id number] DOB: 10-07-1965  Alanda Slim III is a 58 y.o. year old male who is a primary care patient of Luking, Jonna Coup, MD and is actively engaged with the care management team. I reached out to Alanda Slim III by phone today to assist with re-scheduling  with the RN Case Manager.  Follow up plan: Unsuccessful telephone outreach attempt made.  Gwenevere Ghazi  [facility]  [facility], [facility] Guide  Direct Dial: [phone]  Fax [phone]

## 2023-09-14 NOTE — Progress Notes (Signed)
[facility] - Clinic Note  09/22/2023    CHIEF COMPLAINT Patient presents for Retina Follow Up  HISTORY OF PRESENT ILLNESS: Randy Oneal is a 58 y.o. male who presents to the clinic today for:  HPI     Retina Follow Up   Patient presents with  Diabetic Retinopathy.  In both eyes.  This started 5 weeks ago.  I, the attending physician,  performed the HPI with the patient and updated documentation appropriately.        Comments   Patient here for 5 weeks retina follow up for PDR OU. Patient states vision about the same. No eye pain.       Last edited by [name], MD on 09/22/2023  2:50 PM.    Pt states vision is about the same  Referring physician: [name], MD [address] [place],  [place] [address]  HISTORICAL INFORMATION:   Selected notes from the MEDICAL RECORD NUMBER Referred by Dr.[name] for concern of decreased vision post cataract sx   CURRENT MEDICATIONS: Current Outpatient Medications (Ophthalmic Drugs)  Medication Sig   Bromfenac Sodium (PROLENSA) 0.07 % SOLN Place 1 drop into both eyes 4 (four) times daily.   Bromfenac Sodium 0.07 % SOLN Place 1 drop into both eyes 4 (four) times daily.   prednisoLONE acetate (PRED FORTE) 1 % ophthalmic suspension PLACE (1) DROP INTO BOTH EYES FOUR TIMES DAILY   No current facility-administered medications for this visit. (Ophthalmic Drugs)   Current Outpatient Medications (Other)  Medication Sig   acetaminophen (TYLENOL) 500 MG tablet Take 1,000 mg by mouth every 6 (six) hours as needed for moderate pain or headache.   cinacalcet (SENSIPAR) 60 MG tablet Take 60 mg by mouth every Monday, Wednesday, and Friday.   insulin aspart (NOVOLOG FLEXPEN) 100 UNIT/ML FlexPen 7 units with breakfast and lunch, and 8 units with supper CF NovoLog(BG -130/40)  Max daily 45 units (Patient taking differently: Inject 9-10 Units into the skin 3 (three) times daily with meals.)   insulin  degludec (TRESIBA FLEXTOUCH) 100 UNIT/ML FlexTouch Pen Inject 24 Units into the skin at bedtime.   lidocaine (XYLOCAINE) 2 % jelly Apply topically.   sucroferric oxyhydroxide (VELPHORO) 500 MG chewable tablet Chew 1,000 mg by mouth 3 (three) times daily with meals.   Wound Cleansers (VASHE CLEANSING) SOLN Apply topically.   amoxicillin-clavulanate (AUGMENTIN) 500-125 MG tablet Take 1 tablet by mouth 2 (two) times daily. (Patient not taking: Reported on 09/22/2023)   doxycycline (VIBRA-TABS) 100 MG tablet Take 1 tablet (100 mg total) by mouth every 12 (twelve) hours. (Patient not taking: Reported on 09/22/2023)   No current facility-administered medications for this visit. (Other)   REVIEW OF SYSTEMS: ROS   Positive for: Genitourinary, Endocrine, Eyes Negative for: Constitutional, Gastrointestinal, Neurological, Skin, Musculoskeletal, HENT, Cardiovascular, Respiratory, Psychiatric, Allergic/Imm, Heme/Lymph Last edited by [name], COA on 09/22/2023  2:09 PM.         ALLERGIES Allergies  Allergen Reactions   Tramadol Nausea And Vomiting   PAST MEDICAL HISTORY Past Medical History:  Diagnosis Date   Anemia    Cataract 07/02/2020   Diabetes mellitus    Type 2    Diabetic retinopathy of both eyes (HCC) 01/31/2019   [name] 01/2019   ESRD (end stage renal disease) (HCC)    [place] Frenisus   Herpes simplex virus (HSV) infection    OU   Hypertension    Hypertensive retinopathy  OU   Retinal detachment    OS   Sepsis due to Streptococcus, group B (HCC) 10/05/2018   Past Surgical History:  Procedure Laterality Date   25 GAUGE PARS PLANA VITRECTOMY WITH 20 GAUGE MVR PORT FOR MACULAR HOLE Right 02/24/2019   Procedure: 25 GAUGE PARS PLANA VITRECTOMY WITH 20 GAUGE MVR PORT FOR MACULAR HOLE;  Surgeon: [name], MD;  Location: [facility] OR;  Service: Ophthalmology;  Laterality: Right;   APPLICATION OF WOUND VAC Left 08/27/2018   Procedure: APPLICATION OF WOUND VAC, left neck;   Surgeon: [name], MD;  Location: [facility] OR;  Service: Thoracic;  Laterality: Left;   APPLICATION OF WOUND VAC Left 08/30/2018   Procedure: APPLICATION OF WOUND VAC;  Surgeon: [name], MD;  Location: [facility] OR;  Service: Thoracic;  Laterality: Left;   AV FISTULA PLACEMENT Left 02/15/2020   Procedure: LEFT ARM ARTERIOVENOUS (AV) FISTULA CREATION;  Surgeon: [name], MD;  Location: [facility] OR;  Service: Vascular;  Laterality: Left;   CATARACT EXTRACTION Bilateral    CATARACT EXTRACTION W/ INTRAOCULAR LENS  IMPLANT, BILATERAL     COLONOSCOPY  06/24/2011   Procedure: COLONOSCOPY;  Surgeon: [name], MD;  Location: [facility] SUITE;  Service: Endoscopy;  Laterality: N/A;  8:30 AM   EYE SURGERY     GAS/FLUID EXCHANGE Left 03/31/2019   Procedure: Gas/Fluid Exchange;  Surgeon: [name], MD;  Location: [facility] OR;  Service: Ophthalmology;  Laterality: Left;   INJECTION OF SILICONE OIL  02/24/2019   Procedure: Injection Of Silicone Oil;  Surgeon: [name], MD;  Location: [facility] OR;  Service: Ophthalmology;;   INSERTION OF DIALYSIS CATHETER Right 12/05/2019   Procedure: INSERTION OF DIALYSIS CATHETER RIGHT SUBCLAVIAN;  Surgeon: [name], MD;  Location: [facility] ORS;  Service: General;  Laterality: Right;   INSERTION OF DIALYSIS CATHETER Right 12/07/2019   Procedure: Minor Dialysis catheter in place- need to reposition/ adjust catheter to help with flows;  Surgeon: [name], MD;  Location: [facility] ORS;  Service: General;  Laterality: Right;  procedure room case with 1% lidocaine, full sterile drape,  towels, full gown, large chloraprep, 4-0 Monocryl and dermabond    INSERTION OF DIALYSIS CATHETER Right 12/08/2019   Procedure: INSERTION OF DIALYSIS CATHETER EXCHANGE;  Surgeon: [name], MD;  Location: [facility] ORS;  Service: General;  Laterality: Right;   IR ANGIOGRAM EXTREMITY RIGHT  03/15/2021   IR RADIOLOGIST EVAL & MGMT  02/07/2021   IR RADIOLOGIST EVAL & MGMT  05/09/2021   IR  US GUIDE VASC ACCESS RIGHT  03/15/2021   MEMBRANE PEEL Right 02/24/2019   Procedure: [place]ula Flax;  Surgeon: [name], MD;  Location: [facility] OR;  Service: Ophthalmology;  Laterality: Right;   PARS PLANA VITRECTOMY Left 03/31/2019   Procedure: PARS PLANA VITRECTOMY WITH 25 GAUGE WITH MEMBRANE PEEL;  Surgeon: [name], MD;  Location: [facility] OR;  Service: Ophthalmology;  Laterality: Left;   PHOTOCOAGULATION WITH LASER Right 02/24/2019   Procedure: Photocoagulation With Laser;  Surgeon: [name], MD;  Location: [facility] OR;  Service: Ophthalmology;  Laterality: Right;   PHOTOCOAGULATION WITH LASER Left 03/31/2019   Procedure: Photocoagulation With Laser;  Surgeon: [name], MD;  Location: [facility] OR;  Service: Ophthalmology;  Laterality: Left;   RETINAL DETACHMENT SURGERY Left 03/31/2019   TRD Repair - Dr. [name]   SILICON OIL REMOVAL Right 03/31/2019   Procedure: Silicon Oil Removal;  Surgeon: [name], MD;  Location: [facility] OR;  Service: Ophthalmology;  Laterality: Right;   STERNAL WOUND DEBRIDEMENT Left 08/27/2018   Procedure: Incision and DEBRIDEMENT Left Chest, Neck and Mediastinum;  Surgeon: [name], MD;  Location: [facility] OR;  Service: Thoracic;  Laterality: Left;   STERNAL WOUND DEBRIDEMENT Left 08/30/2018   Procedure: WOUND VAC CHANGE, LEFT CHEST AND NECK, POSSIBLE DEBRIDEMENT;  Surgeon: [name], MD;  Location: [facility] OR;  Service: Thoracic;  Laterality: Left;   FAMILY HISTORY Family History  Problem Relation Age of Onset   Hypertension Mother    Colon cancer Neg Hx    SOCIAL HISTORY Social History   Tobacco Use   Smoking status: Never   Smokeless tobacco: Never  Vaping Use   Vaping status: Never Used  Substance Use Topics   Alcohol use: No   Drug use: No       OPHTHALMIC EXAM:  Base Eye Exam     Visual Acuity (Snellen - Linear)       Right Left   Dist [place] 20/200 -2 20/200 -2   Dist ph  20/150 20/150 -2    Correction: Glasses         Tonometry  (Tonopen, 2:07 PM)       Right Left   Pressure 16 16         Pupils       Dark Light Shape React APD   Right 4 3 Irregular Minimal None   Left 4 3 Irregular Minimal None         Visual Fields (Counting fingers)       Left Right   Restrictions Partial outer superior temporal, inferior temporal, inferior nasal deficiencies Partial outer superior temporal, inferior temporal deficiencies         Extraocular Movement       Right Left    Full, Ortho Full, Ortho         Neuro/Psych     Oriented x3: Yes   Mood/Affect: Normal         Dilation     Both eyes: 1.0% Mydriacyl, 2.5% Phenylephrine @ 2:07 PM           Slit Lamp and Fundus Exam     Slit Lamp Exam       Right Left   Lids/Lashes mild Meibomian gland dysfunction mild Meibomian gland dysfunction   Conjunctiva/Sclera subconj silicon oil bubbles, Chemosis, conj Cyst White and quiet   Cornea Trace PEE, Well healed cataract wound, arcus, Debris in tear film Trace PEE, Well healed cataract wound, arcus, Debris in tear film   Anterior Chamber Deep and quiet, no cell or flare Deep and quiet, no cell or flare   Iris slightly irregular dilation, posterior synchiae from 3:00-1200 round, focal Posterior synechiae at 1030   Lens PC IOL in good position, pigment deposition, 1+ PCO, mild pigment deposition on optic PC IOL in good position, mild pigment deposition   Anterior Vitreous Post vit, silicon oil bubble ~85% post vitrectomy, good silicone oil fill         Fundus Exam       Right Left   Disc 2+pallor, sharp rim, mild fibrosis along SN rim +pallor, sharp rim, fine NVD -- regressed, mild fibrosis   C/D Ratio 0.2 0.2   Macula attached under oil, scattered MA/IRH -- improved, nasal and central thickening / edema attached under oil, persistent fibrosis, persistent edema, focal bands of PRF with traction emanating from ST macula -- stable, focal DBH temporal macula -- improved   Vessels Severe attenuation,  Tortuous Severe attenuation, Tortuous   Periphery Attached, dense 360 PRP laser, mild scattered fibrosis - persistent, Focal ret hole along distal IT arcades -- good laser surrounding Attached, good 360 PRP laser changes, pre-retinal fibrosis extending to posterior PRP border superior and inferiorly           IMAGING AND PROCEDURES  Imaging and Procedures for @TODAY @  OCT, Retina - OU - Both Eyes       Right Eye Quality was good. Central Foveal Thickness: 610. Progression has improved. Findings include no SRF, abnormal foveal contour, epiretinal membrane, intraretinal fluid, outer retinal atrophy, preretinal fibrosis (Persistent central cystic changes -- slightly improved).   Left Eye Quality was good. Central Foveal Thickness: 610. Progression has improved. Findings include no SRF, abnormal foveal contour, intraretinal hyper-reflective material, epiretinal membrane, intraretinal fluid, preretinal fibrosis (Persistent central cystic changes -- slightly improved).   Notes *Images captured and stored on drive  Diagnosis / Impression:  +DME under silicon oil OU OD: persistent central cystic changes -- slightly improved OS: Persistent central cystic changes -- slightly improved  Clinical management:  See below  Abbreviations: NFP - Normal foveal profile. CME - cystoid macular edema. PED - pigment epithelial detachment. IRF - intraretinal fluid. SRF - subretinal fluid. EZ - ellipsoid zone. ERM - epiretinal membrane. ORA - outer retinal atrophy. ORT - outer retinal tubulation. SRHM - subretinal hyper-reflective material      Intravitreal Injection, Pharmacologic Agent - OD - Right Eye       Time Out 09/22/2023. 2:25 PM. Confirmed correct patient, procedure, site, and patient consented.   Anesthesia Topical anesthesia was used. Anesthetic medications included Lidocaine 2%, Proparacaine 0.5%.   Procedure Preparation included 5% betadine to ocular surface, eyelid speculum. A (32g)  needle was used.   Injection: 2 mg aflibercept 2 MG/0.05ML   Route: Intravitreal, Site: Right Eye   NDC: L6038910, Lot: [id number], Expiration date: 01/07/2025, Waste: 0 mL   Post-op Post injection exam found visual acuity of at least counting fingers. The patient tolerated the procedure well. There were no complications. The patient received written and verbal post procedure care education. Post injection medications were not given.      Intravitreal Injection, Pharmacologic Agent - OS - Left Eye       Time Out 09/22/2023. 2:26 PM. Confirmed correct patient, procedure, site, and patient consented.   Anesthesia Topical anesthesia was used. Anesthetic medications included Lidocaine 2%, Proparacaine 0.5%.   Procedure Preparation included 5% betadine to ocular surface, eyelid speculum. A (32g) needle was used.   Injection: 6 mg faricimab-svoa 6 MG/0.05ML   Route: Intravitreal, Site: Left Eye   NDC: 09811-914-78, Lot: [id number], Expiration date: 12/08/2024, Waste: 0 mL   Post-op Post injection exam found visual acuity of at least counting fingers. The patient tolerated the procedure well. There were no complications. The patient received written and verbal post procedure care education. Post injection medications were not given.            ASSESSMENT/PLAN:    ICD-10-CM   1. Proliferative diabetic retinopathy of both eyes with macular edema associated with type 2 diabetes mellitus (HCC)  E11.3513 OCT, Retina - OU - Both Eyes    Intravitreal Injection, Pharmacologic Agent - OD - Right Eye    Intravitreal Injection, Pharmacologic Agent - OS - Left Eye    faricimab-svoa (VABYSMO) 6mg /0.74mL intravitreal injection    aflibercept (EYLEA) SOLN 2 mg    2. Long term (current) use of oral hypoglycemic drugs  Z79.84  3. Long-term (current) use of injectable non-insulin antidiabetic drugs  Z79.85     4. 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 Vitreous  hemorrhage of both eyes (HCC)  H43.13     6. Essential hypertension  I10     7. Hypertensive retinopathy of both eyes  H35.033     8. Pseudophakia of both eyes  Z96.1      1-5. Proliferative diabetic retinopathy with DME, TRD, and vitreous hemorrhage OU (OD > OS)  - last A1c: 8.3 on 12.10.24  - lost to f/u from Jan 2021 to Oct 2021 due to loss of insurance coverage  - pt with complex medical history with hospitalization in Jan-Feb 2020 for bacteremia and abscess  - history of poor glycemic control for years  - s/p IVA OD #1 (06.20.20), #2 (07.01.20), #3 (09.11.20), #4 (10.09.20), #5 (11.06.20), #6 (11.24.21), #7 (12.22.21), #8 (01.19.22)  - s/p IVA OS #1 (06.20.20), #2 (07.01.20), #3 (08.13.20), #4 (09.11.20)  #5 (11.06.20), #6 (11.24.21), #7 (12.22.21), #8 (01.19.22) - s/p IVE OU #1 (12.04.20) - sample; #2 (01.06.21), #3 (02.18.22), #4 (03.18.22), #5 (04.18.22), #6 (05.25.22), #7 (07.01.22), #8 (07.29.22), #9 (09.02.22), #10 (09.30.22), #11 (11.04.22), #12 (12.09.23), #13 (01.13.23), #14 (03.02.23), #15 (04.11.23), #16 (06.01.23), #17 (07.11.23), #18 (09.12.23), #19 (11.14.23) - s/p IVE OD #20 (03.12.24), #21 (04.23.24), #22 (06.04.24), #23 (07.16.24), #24 (08.30.24), #25 (10.15.24), #26 (11.27.24) - s/p IVV OS #1 (03.12.24), #2 (04.23.24), #3 (06.04.24), #4 (07.16.24), #5 (08.30.24), #6 (10.15.24), #7 (11.27.24)  - s/p STK OS #1 (10.09.20)  - s/p PRP OS (06.10.20)  - s/p PPV/PFC/EL/FAX/silicone oil OD, 07.16.20  - s/p subconj silicon oil removal OD 8.20.20  - s/p PPV/EL/FAX/silicone oil OS, 08.20.20  - BCVA 20/150 OU -- stable             - OCT shows persistent central cystic changes -- slightly improved OU at 5+ weeks  - recommend IVE OD #27 and IVV OS #8 today, 02.11.25 with f/u in 5-6 weeks  - RBA of procedure discussed, questions answered  - IVV informed consent obtained and signed, 01.02.25 (OU)  - IVE informed consent obtained and singed, 01.02.25  - see procedure note              - Eylea benefits investigation begun 01.19.22 -- approved for 2025  - cont topical PF and Prolensa QID OU for possible CME component  - f/u 5 weeks -- DFE/OCT, possible injection(s)  6,7. Hypertensive retinopathy OU  - discussed importance of tight BP control  - continue to monitor  8. Pseudophakia OU  - s/p CE with IOL Nile Riggs)  Ophthalmic Meds Ordered this visit:  Meds ordered this encounter  Medications   Bromfenac Sodium 0.07 % SOLN    Sig: Place 1 drop into both eyes 4 (four) times daily.    Dispense:  6 mL    Refill:  10   faricimab-svoa (VABYSMO) 6mg /0.64mL intravitreal injection   aflibercept (EYLEA) SOLN 2 mg     Return for f/u 5-6 weeks PDR OU, DFE, OCT.  There are no Patient Instructions on file for this visit.  This document serves as a record of services personally performed by Karie Chimera, MD, PhD. It was created on their behalf by Charlette Caffey, COT an ophthalmic technician. The creation of this record is the provider's dictation and/or activities during the visit.    Electronically signed by:  Charlette Caffey, COT  09/22/23 2:54 PM  This document serves  as a record of services personally performed by Karie Chimera, MD, PhD. It was created on their behalf by Glee Arvin. Manson Passey, OA an ophthalmic technician. The creation of this record is the provider's dictation and/or activities during the visit.    Electronically signed by: Glee Arvin. Manson Passey, OA 09/22/23 2:54 PM  Karie Chimera, M.D., Ph.D. Diseases & Surgery of the Retina and Vitreous [facility]  I have reviewed the above documentation for accuracy and completeness, and I agree with the above. Karie Chimera, M.D., Ph.D. 09/22/23 2:54 PM   Abbreviations: M myopia (nearsighted); A astigmatism; H hyperopia (farsighted); P presbyopia; Mrx spectacle prescription;  CTL contact lenses; OD right eye; OS left eye; OU both eyes  XT exotropia; ET esotropia; PEK punctate  epithelial keratitis; PEE punctate epithelial erosions; DES dry eye syndrome; MGD meibomian gland dysfunction; ATs artificial tears; PFAT's preservative free artificial tears; NSC nuclear sclerotic cataract; PSC posterior subcapsular cataract; ERM epi-retinal membrane; PVD posterior vitreous detachment; RD retinal detachment; DM diabetes mellitus; DR diabetic retinopathy; NPDR non-proliferative diabetic retinopathy; PDR proliferative diabetic retinopathy; CSME clinically significant macular edema; DME diabetic macular edema; dbh dot blot hemorrhages; CWS cotton wool spot; POAG primary open angle glaucoma; C/D cup-to-disc ratio; HVF humphrey visual field; GVF goldmann visual field; OCT optical coherence tomography; IOP intraocular pressure; BRVO Branch retinal vein occlusion; CRVO central retinal vein occlusion; CRAO central retinal artery occlusion; BRAO branch retinal artery occlusion; RT retinal tear; SB scleral buckle; PPV pars plana vitrectomy; VH Vitreous hemorrhage; PRP panretinal laser photocoagulation; IVK intravitreal kenalog; VMT vitreomacular traction; MH Macular hole;  NVD neovascularization of the disc; NVE neovascularization elsewhere; AREDS age related eye disease study; ARMD age related macular degeneration; POAG primary open angle glaucoma; EBMD epithelial/anterior basement membrane dystrophy; ACIOL anterior chamber intraocular lens; IOL intraocular lens; PCIOL posterior chamber intraocular lens; Phaco/IOL phacoemulsification with intraocular lens placement; PRK photorefractive keratectomy; LASIK laser assisted in situ keratomileusis; HTN hypertension; DM diabetes mellitus; COPD chronic obstructive pulmonary disease

## 2023-09-15 ENCOUNTER — Encounter (INDEPENDENT_AMBULATORY_CARE_PROVIDER_SITE_OTHER): Payer: Medicare Other | Admitting: Ophthalmology

## 2023-09-15 DIAGNOSIS — E113513 Type 2 diabetes mellitus with proliferative diabetic retinopathy with macular edema, bilateral: Secondary | ICD-10-CM

## 2023-09-15 DIAGNOSIS — I10 Essential (primary) hypertension: Secondary | ICD-10-CM

## 2023-09-15 DIAGNOSIS — Z961 Presence of intraocular lens: Secondary | ICD-10-CM

## 2023-09-15 DIAGNOSIS — Z7984 Long term (current) use of oral hypoglycemic drugs: Secondary | ICD-10-CM

## 2023-09-15 DIAGNOSIS — H3343 Traction detachment of retina, bilateral: Secondary | ICD-10-CM

## 2023-09-15 DIAGNOSIS — H4313 Vitreous hemorrhage, bilateral: Secondary | ICD-10-CM

## 2023-09-15 DIAGNOSIS — Z7985 Long-term (current) use of injectable non-insulin antidiabetic drugs: Secondary | ICD-10-CM

## 2023-09-15 DIAGNOSIS — H35033 Hypertensive retinopathy, bilateral: Secondary | ICD-10-CM

## 2023-09-16 NOTE — Progress Notes (Signed)
 Complex Care Management Care Guide Note  09/16/2023 Name: Randy Oneal MRN: [id number] DOB: 11/15/1965  Randy Oneal is a 58 y.o. year old male who is a primary care patient of Luking, Glendia LABOR, MD and is actively engaged with the care management team. I reached out to Randy Oneal by phone today to assist with re-scheduling  with the RN Case Manager.  Follow up plan: Unsuccessful telephone outreach attempt made. No further outreach attempts will be made at this time. We have been unable to contact the patient.  Harlene Satterfield  [facility]  [facility], [facility] Guide  Direct Dial : [phone]  Fax [phone]

## 2023-09-17 ENCOUNTER — Ambulatory Visit: Payer: Self-pay | Admitting: *Deleted

## 2023-09-22 ENCOUNTER — Ambulatory Visit (INDEPENDENT_AMBULATORY_CARE_PROVIDER_SITE_OTHER): Payer: Medicare Other | Admitting: Ophthalmology

## 2023-09-22 ENCOUNTER — Encounter (INDEPENDENT_AMBULATORY_CARE_PROVIDER_SITE_OTHER): Payer: Self-pay | Admitting: Ophthalmology

## 2023-09-22 DIAGNOSIS — Z7984 Long term (current) use of oral hypoglycemic drugs: Secondary | ICD-10-CM

## 2023-09-22 DIAGNOSIS — E113513 Type 2 diabetes mellitus with proliferative diabetic retinopathy with macular edema, bilateral: Secondary | ICD-10-CM | POA: Diagnosis not present

## 2023-09-22 DIAGNOSIS — H3343 Traction detachment of retina, bilateral: Secondary | ICD-10-CM

## 2023-09-22 DIAGNOSIS — H4313 Vitreous hemorrhage, bilateral: Secondary | ICD-10-CM

## 2023-09-22 DIAGNOSIS — Z7985 Long-term (current) use of injectable non-insulin antidiabetic drugs: Secondary | ICD-10-CM

## 2023-09-22 DIAGNOSIS — Z961 Presence of intraocular lens: Secondary | ICD-10-CM

## 2023-09-22 DIAGNOSIS — I10 Essential (primary) hypertension: Secondary | ICD-10-CM

## 2023-09-22 DIAGNOSIS — H35033 Hypertensive retinopathy, bilateral: Secondary | ICD-10-CM

## 2023-09-22 MED ORDER — AFLIBERCEPT 2MG/0.05ML IZ SOLN FOR KALEIDOSCOPE
2.0000 mg | INTRAVITREAL | Status: AC | PRN
  Administered 2023-09-22: 2 mg via INTRAVITREAL

## 2023-09-22 MED ORDER — BROMFENAC SODIUM 0.07 % OP SOLN: 1.0000 [drp] | Freq: Four times a day (QID) | OPHTHALMIC | 10 refills | Status: AC

## 2023-09-22 MED ORDER — FARICIMAB-SVOA 6 MG/0.05ML IZ SOSY
6.0000 mg | PREFILLED_SYRINGE | INTRAVITREAL | Status: AC | PRN
  Administered 2023-09-22: 6 mg via INTRAVITREAL

## 2023-09-28 ENCOUNTER — Other Ambulatory Visit (INDEPENDENT_AMBULATORY_CARE_PROVIDER_SITE_OTHER): Payer: Self-pay

## 2023-09-28 MED ORDER — BROMFENAC SODIUM 0.07 % OP SOLN: 1.0000 [drp] | Freq: Four times a day (QID) | OPHTHALMIC | 6 refills | Status: AC

## 2023-10-13 NOTE — Progress Notes (Signed)
 [facility] - Clinic Note  10/27/2023    CHIEF COMPLAINT Patient presents for Retina Follow Up  HISTORY OF PRESENT ILLNESS: Randy Oneal is a 58 y.o. male who presents to the clinic today for:  HPI     Retina Follow Up   Patient presents with  Diabetic Retinopathy.  In both eyes.  This started 5 weeks ago.  Severity is moderate.  Since onset it is stable.  I, the attending physician,  performed the HPI with the patient and updated documentation appropriately.        Comments   Patient here for 5 weeks retina follow up for PDR OU. Patient states vision about the same. Pt denies any discomfort. Pt denies any flashes or floaters. Pt is currently on prednisolone 4 times per day OU and Bromfenac qid OU.        Last edited by [name], MD on 10/27/2023  5:12 PM.     Pt feels like vision is a little better  Referring physician: [name], MD [address] [place],  [place] [address]  HISTORICAL INFORMATION:   Selected notes from the MEDICAL RECORD NUMBER Referred by Dr.[name] for concern of decreased vision post cataract sx   CURRENT MEDICATIONS: Current Outpatient Medications (Ophthalmic Drugs)  Medication Sig   Bromfenac Sodium (PROLENSA) 0.07 % SOLN Place 1 drop into both eyes 4 (four) times daily.   prednisoLONE acetate (PRED FORTE) 1 % ophthalmic suspension PLACE (1) DROP INTO BOTH EYES FOUR TIMES DAILY   Bromfenac Sodium 0.07 % SOLN Place 1 drop into both eyes 4 (four) times daily. (Patient not taking: Reported on 10/27/2023)   Bromfenac Sodium 0.07 % SOLN Place 1 drop into both eyes 4 (four) times daily. (Patient not taking: Reported on 10/27/2023)   No current facility-administered medications for this visit. (Ophthalmic Drugs)   Current Outpatient Medications (Other)  Medication Sig   acetaminophen (TYLENOL) 500 MG tablet Take 1,000 mg by mouth every 6 (six) hours as needed for moderate pain or headache.   cinacalcet  (SENSIPAR) 60 MG tablet Take 60 mg by mouth every Monday, Wednesday, and Friday.   insulin aspart (NOVOLOG FLEXPEN) 100 UNIT/ML FlexPen 7 units with breakfast and lunch, and 8 units with supper CF NovoLog(BG -130/40)  Max daily 45 units (Patient taking differently: Inject 9-10 Units into the skin 3 (three) times daily with meals.)   insulin degludec (TRESIBA FLEXTOUCH) 100 UNIT/ML FlexTouch Pen Inject 24 Units into the skin at bedtime.   sucroferric oxyhydroxide (VELPHORO) 500 MG chewable tablet Chew 1,000 mg by mouth 3 (three) times daily with meals.   Wound Cleansers (VASHE CLEANSING) SOLN Apply topically.   amoxicillin-clavulanate (AUGMENTIN) 500-125 MG tablet Take 1 tablet by mouth 2 (two) times daily. (Patient not taking: Reported on 07/21/2023)   doxycycline (VIBRA-TABS) 100 MG tablet Take 1 tablet (100 mg total) by mouth every 12 (twelve) hours. (Patient not taking: Reported on 07/21/2023)   lidocaine (XYLOCAINE) 2 % jelly Apply topically. (Patient not taking: Reported on 10/27/2023)   No current facility-administered medications for this visit. (Other)   REVIEW OF SYSTEMS: ROS   Positive for: Genitourinary, Endocrine, Eyes Negative for: Constitutional, Gastrointestinal, Neurological, Skin, Musculoskeletal, HENT, Cardiovascular, Respiratory, Psychiatric, Allergic/Imm, Heme/Lymph Last edited by Elicia Lamp, COT on 10/27/2023  2:04 PM.          ALLERGIES Allergies  Allergen Reactions   Tramadol Nausea And Vomiting   PAST MEDICAL HISTORY Past Medical History:  Diagnosis Date   Anemia    Cataract 07/02/2020   Diabetes mellitus    Type 2    Diabetic retinopathy of both eyes (HCC) 01/31/2019   [name] 01/2019   ESRD (end stage renal disease) (HCC)    [place] Frenisus   Herpes simplex virus (HSV) infection    OU   Hypertension    Hypertensive retinopathy    OU   Retinal detachment    OS   Sepsis due to Streptococcus, group B (HCC) 10/05/2018   Past Surgical  History:  Procedure Laterality Date   25 GAUGE PARS PLANA VITRECTOMY WITH 20 GAUGE MVR PORT FOR MACULAR HOLE Right 02/24/2019   Procedure: 25 GAUGE PARS PLANA VITRECTOMY WITH 20 GAUGE MVR PORT FOR MACULAR HOLE;  Surgeon: [name], MD;  Location: [facility] OR;  Service: Ophthalmology;  Laterality: Right;   APPLICATION OF WOUND VAC Left 08/27/2018   Procedure: APPLICATION OF WOUND VAC, left neck;  Surgeon: [name], MD;  Location: [facility] OR;  Service: Thoracic;  Laterality: Left;   APPLICATION OF WOUND VAC Left 08/30/2018   Procedure: APPLICATION OF WOUND VAC;  Surgeon: [name], MD;  Location: [facility] OR;  Service: Thoracic;  Laterality: Left;   AV FISTULA PLACEMENT Left 02/15/2020   Procedure: LEFT ARM ARTERIOVENOUS (AV) FISTULA CREATION;  Surgeon: [name], MD;  Location: [facility] OR;  Service: Vascular;  Laterality: Left;   CATARACT EXTRACTION Bilateral    CATARACT EXTRACTION W/ INTRAOCULAR LENS  IMPLANT, BILATERAL     COLONOSCOPY  06/24/2011   Procedure: COLONOSCOPY;  Surgeon: [name], MD;  Location: [facility] SUITE;  Service: Endoscopy;  Laterality: N/A;  8:30 AM   EYE SURGERY     GAS/FLUID EXCHANGE Left 03/31/2019   Procedure: Gas/Fluid Exchange;  Surgeon: [name], MD;  Location: [facility] OR;  Service: Ophthalmology;  Laterality: Left;   INJECTION OF SILICONE OIL  02/24/2019   Procedure: Injection Of Silicone Oil;  Surgeon: [name], MD;  Location: [facility] OR;  Service: Ophthalmology;;   INSERTION OF DIALYSIS CATHETER Right 12/05/2019   Procedure: INSERTION OF DIALYSIS CATHETER RIGHT SUBCLAVIAN;  Surgeon: [name], MD;  Location: [facility] ORS;  Service: General;  Laterality: Right;   INSERTION OF DIALYSIS CATHETER Right 12/07/2019   Procedure: Minor Dialysis catheter in place- need to reposition/ adjust catheter to help with flows;  Surgeon: [name], MD;  Location: [facility] ORS;  Service: General;  Laterality: Right;  procedure room case with 1% lidocaine, full sterile  drape,  towels, full gown, large chloraprep, 4-0 Monocryl and dermabond    INSERTION OF DIALYSIS CATHETER Right 12/08/2019   Procedure: INSERTION OF DIALYSIS CATHETER EXCHANGE;  Surgeon: [name], MD;  Location: [facility] ORS;  Service: General;  Laterality: Right;   IR ANGIOGRAM EXTREMITY RIGHT  03/15/2021   IR RADIOLOGIST EVAL & MGMT  02/07/2021   IR RADIOLOGIST EVAL & MGMT  05/09/2021   IR US GUIDE VASC ACCESS RIGHT  03/15/2021   MEMBRANE PEEL Right 02/24/2019   Procedure: [place] Flax;  Surgeon: [name], MD;  Location: [facility] OR;  Service: Ophthalmology;  Laterality: Right;   PARS PLANA VITRECTOMY Left 03/31/2019   Procedure: PARS PLANA VITRECTOMY WITH 25 GAUGE WITH MEMBRANE PEEL;  Surgeon: [name], MD;  Location: [facility] OR;  Service: Ophthalmology;  Laterality: Left;   PHOTOCOAGULATION WITH LASER Right 02/24/2019   Procedure: Photocoagulation With Laser;  Surgeon: [name], MD;  Location: [facility] OR;  Service: Ophthalmology;  Laterality: Right;  PHOTOCOAGULATION WITH LASER Left 03/31/2019   Procedure: Photocoagulation With Laser;  Surgeon: [name], MD;  Location: [facility] OR;  Service: Ophthalmology;  Laterality: Left;   RETINAL DETACHMENT SURGERY Left 03/31/2019   TRD Repair - Dr. [name]   SILICON OIL REMOVAL Right 03/31/2019   Procedure: Silicon Oil Removal;  Surgeon: [name], MD;  Location: [facility] OR;  Service: Ophthalmology;  Laterality: Right;   STERNAL WOUND DEBRIDEMENT Left 08/27/2018   Procedure: Incision and DEBRIDEMENT Left Chest, Neck and Mediastinum;  Surgeon: [name], MD;  Location: [facility] OR;  Service: Thoracic;  Laterality: Left;   STERNAL WOUND DEBRIDEMENT Left 08/30/2018   Procedure: WOUND VAC CHANGE, LEFT CHEST AND NECK, POSSIBLE DEBRIDEMENT;  Surgeon: [name], MD;  Location: [facility] OR;  Service: Thoracic;  Laterality: Left;   FAMILY HISTORY Family History  Problem Relation Age of Onset   Hypertension Mother    Colon cancer Neg Hx    SOCIAL  HISTORY Social History   Tobacco Use   Smoking status: Never   Smokeless tobacco: Never  Vaping Use   Vaping status: Never Used  Substance Use Topics   Alcohol use: No   Drug use: No       OPHTHALMIC EXAM:  Base Eye Exam     Visual Acuity (Snellen - Linear)       Right Left   Dist [place] 20/300 -1 20/200 -2   Dist ph cc 20/150 -1 20/150 +1         Tonometry (Tonopen, 2:18 PM)       Right Left   Pressure 15 12         Pupils       Dark Light Shape React APD   Right 4 3 Irregular Slow None   Left 4 3 Irregular Slow None         Visual Fields       Left Right    Full Full         Extraocular Movement       Right Left    Full, Ortho Full, Ortho         Neuro/Psych     Oriented x3: Yes   Mood/Affect: Normal         Dilation     Both eyes: 1.0% Mydriacyl, 2.5% Phenylephrine @ 2:19 PM           Slit Lamp and Fundus Exam     Slit Lamp Exam       Right Left   Lids/Lashes mild Meibomian gland dysfunction mild Meibomian gland dysfunction   Conjunctiva/Sclera subconj silicon oil bubbles, Chemosis, conj Cyst White and quiet   Cornea Trace PEE, Well healed cataract wound, arcus, Debris in tear film Trace PEE, Well healed cataract wound, arcus, Debris in tear film   Anterior Chamber Deep and quiet, no cell or flare Deep and quiet, no cell or flare   Iris slightly irregular dilation, posterior synchiae from 3:00-1200 round, focal Posterior synechiae at 1030   Lens PC IOL in good position, pigment deposition, 1+ PCO, mild pigment deposition on optic PC IOL in good position, mild pigment deposition   Anterior Vitreous Post vit, silicon oil bubble ~85% post vitrectomy, good silicone oil fill         Fundus Exam       Right Left   Disc 2+pallor, sharp rim, mild fibrosis along SN rim +pallor, sharp rim, fine NVD -- regressed, mild fibrosis   C/D Ratio 0.2  0.2   Macula attached under oil, scattered MA/IRH -- improved, nasal and central  thickening / edema attached under oil, persistent fibrosis, persistent edema, focal bands of PRF with traction emanating from ST macula -- stable, focal DBH temporal macula -- improved   Vessels Severe attenuation, Tortuous Severe attenuation, Tortuous   Periphery Attached, dense 360 PRP laser, mild scattered fibrosis - persistent, Focal ret hole along distal IT arcades -- good laser surrounding Attached, good 360 PRP laser changes, pre-retinal fibrosis extending to posterior PRP border superior and inferiorly           IMAGING AND PROCEDURES  Imaging and Procedures for @TODAY @  OCT, Retina - OU - Both Eyes       Right Eye Quality was good. Central Foveal Thickness: 531. Progression has been stable. Findings include no SRF, abnormal foveal contour, epiretinal membrane, intraretinal fluid, outer retinal atrophy, preretinal fibrosis (Persistent central cystic changes ).   Left Eye Quality was good. Central Foveal Thickness: 560. Progression has improved. Findings include no SRF, abnormal foveal contour, intraretinal hyper-reflective material, epiretinal membrane, intraretinal fluid, preretinal fibrosis (Persistent central cystic changes -- slightly improved).   Notes *Images captured and stored on drive  Diagnosis / Impression:  +DME under silicon oil OU OD: persistent central cystic changes  OS: Persistent central cystic changes -- slightly improved  Clinical management:  See below  Abbreviations: NFP - Normal foveal profile. CME - cystoid macular edema. PED - pigment epithelial detachment. IRF - intraretinal fluid. SRF - subretinal fluid. EZ - ellipsoid zone. ERM - epiretinal membrane. ORA - outer retinal atrophy. ORT - outer retinal tubulation. SRHM - subretinal hyper-reflective material      Intravitreal Injection, Pharmacologic Agent - OD - Right Eye       Time Out 10/27/2023. 3:28 PM. Confirmed correct patient, procedure, site, and patient consented.    Anesthesia Topical anesthesia was used. Anesthetic medications included Lidocaine 2%, Proparacaine 0.5%.   Procedure Preparation included 5% betadine to ocular surface, eyelid speculum. A (32g) needle was used.   Injection: 2 mg aflibercept 2 MG/0.05ML   Route: Intravitreal, Site: Right Eye   NDC: L6038910, Lot: [id number], Expiration date: 01/07/2025, Waste: 0 mL   Post-op Post injection exam found visual acuity of at least counting fingers. The patient tolerated the procedure well. There were no complications. The patient received written and verbal post procedure care education. Post injection medications were not given.      Intravitreal Injection, Pharmacologic Agent - OS - Left Eye       Time Out 10/27/2023. 3:28 PM. Confirmed correct patient, procedure, site, and patient consented.   Anesthesia Topical anesthesia was used. Anesthetic medications included Lidocaine 2%, Proparacaine 0.5%.   Procedure Preparation included 5% betadine to ocular surface, eyelid speculum. A (32g) needle was used.   Injection: 6 mg faricimab-svoa 6 MG/0.05ML   Route: Intravitreal, Site: Left Eye   NDC: 82956-213-08, Lot: [id number], Expiration date: 12/08/2024, Waste: 0 mL   Post-op Post injection exam found visual acuity of at least counting fingers. The patient tolerated the procedure well. There were no complications. The patient received written and verbal post procedure care education. Post injection medications were not given.            ASSESSMENT/PLAN:    ICD-10-CM   1. Proliferative diabetic retinopathy of both eyes with macular edema associated with type 2 diabetes mellitus (HCC)  E11.3513 OCT, Retina - OU - Both Eyes    Intravitreal Injection, Pharmacologic Agent - OD -  Right Eye    Intravitreal Injection, Pharmacologic Agent - OS - Left Eye    faricimab-svoa (VABYSMO) 6mg /0.12mL intravitreal injection    aflibercept (EYLEA) SOLN 2 mg    2. Long term (current) use of  oral hypoglycemic drugs  Z79.84     3. Long-term (current) use of injectable non-insulin antidiabetic drugs  Z79.85     4. 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 Vitreous hemorrhage of both eyes (HCC)  H43.13     6. Essential hypertension  I10     7. Hypertensive retinopathy of both eyes  H35.033     8. Pseudophakia of both eyes  Z96.1      1-5. Proliferative diabetic retinopathy with DME, TRD, and vitreous hemorrhage OU (OD > OS)  - last A1c: 8.3 on 12.10.24  - lost to f/u from Jan 2021 to Oct 2021 due to loss of insurance coverage  - pt with complex medical history with hospitalization in Jan-Feb 2020 for bacteremia and abscess  - history of poor glycemic control for years  - s/p IVA OD #1 (06.20.20), #2 (07.01.20), #3 (09.11.20), #4 (10.09.20), #5 (11.06.20), #6 (11.24.21), #7 (12.22.21), #8 (01.19.22)  - s/p IVA OS #1 (06.20.20), #2 (07.01.20), #3 (08.13.20), #4 (09.11.20)  #5 (11.06.20), #6 (11.24.21), #7 (12.22.21), #8 (01.19.22) - s/p IVE OU #1 (12.04.20) - sample; #2 (01.06.21), #3 (02.18.22), #4 (03.18.22), #5 (04.18.22), #6 (05.25.22), #7 (07.01.22), #8 (07.29.22), #9 (09.02.22), #10 (09.30.22), #11 (11.04.22), #12 (12.09.23), #13 (01.13.23), #14 (03.02.23), #15 (04.11.23), #16 (06.01.23), #17 (07.11.23), #18 (09.12.23), #19 (11.14.23) - s/p IVE OD #20 (03.12.24), #21 (04.23.24), #22 (06.04.24), #23 (07.16.24), #24 (08.30.24), #25 (10.15.24), #26 (11.27.24), #27 (02.11.25) - s/p IVV OS #1 (03.12.24), #2 (04.23.24), #3 (06.04.24), #4 (07.16.24), #5 (08.30.24), #6 (10.15.24), #7 (11.27.24), #8 (02.11.25)  - s/p STK OS #1 (10.09.20)  - s/p PRP OS (06.10.20)  - s/p PPV/PFC/EL/FAX/silicone oil OD, 07.16.20  - s/p subconj silicon oil removal OD 8.20.20  - s/p PPV/EL/FAX/silicone oil OS, 08.20.20  - BCVA 20/150 OU -- stable             - OCT shows OD: persistent central cystic changes; OS: Persistent central cystic changes -- slightly improved at 5 weeks  -  recommend IVE OD #28 and IVV OS #9 today, 03.18.25 with f/u in 5-6 weeks  - RBA of procedure discussed, questions answered  - IVV informed consent obtained and signed, 01.02.25 (OU)  - IVE informed consent obtained and singed, 01.02.25  - see procedure note             - Eylea benefits investigation begun 01.19.22 -- approved for 2025  - cont topical PF and Prolensa QID OU for possible CME component  - f/u 5 weeks -- DFE/OCT, possible injection(s)  6,7. Hypertensive retinopathy OU  - discussed importance of tight BP control  - continue to monitor  8. Pseudophakia OU  - s/p CE with IOL Nile Riggs)  Ophthalmic Meds Ordered this visit:  Meds ordered this encounter  Medications   faricimab-svoa (VABYSMO) 6mg /0.21mL intravitreal injection   aflibercept (EYLEA) SOLN 2 mg     Return for f/u 5-6 weeks, PDR OU, DFE, OCT, Possible Injxn.  There are no Patient Instructions on file for this visit.  This document serves as a record of services personally performed by Karie Chimera, MD, PhD. It was created on their behalf by Charlette Caffey, COT an ophthalmic technician. The creation of this record is the provider's  dictation and/or activities during the visit.    Electronically signed by:  Charlette Caffey, COT  10/27/23 5:19 PM  This document serves as a record of services personally performed by Karie Chimera, MD, PhD. It was created on their behalf by Glee Arvin. Manson Passey, OA an ophthalmic technician. The creation of this record is the provider's dictation and/or activities during the visit.    Electronically signed by: Glee Arvin. Manson Passey, OA 10/27/23 5:19 PM  Karie Chimera, M.D., Ph.D. Diseases & Surgery of the Retina and Vitreous [facility]  I have reviewed the above documentation for accuracy and completeness, and I agree with the above. Karie Chimera, M.D., Ph.D. 10/27/23 5:20 PM   Abbreviations: M myopia (nearsighted); A astigmatism; H hyperopia  (farsighted); P presbyopia; Mrx spectacle prescription;  CTL contact lenses; OD right eye; OS left eye; OU both eyes  XT exotropia; ET esotropia; PEK punctate epithelial keratitis; PEE punctate epithelial erosions; DES dry eye syndrome; MGD meibomian gland dysfunction; ATs artificial tears; PFAT's preservative free artificial tears; NSC nuclear sclerotic cataract; PSC posterior subcapsular cataract; ERM epi-retinal membrane; PVD posterior vitreous detachment; RD retinal detachment; DM diabetes mellitus; DR diabetic retinopathy; NPDR non-proliferative diabetic retinopathy; PDR proliferative diabetic retinopathy; CSME clinically significant macular edema; DME diabetic macular edema; dbh dot blot hemorrhages; CWS cotton wool spot; POAG primary open angle glaucoma; C/D cup-to-disc ratio; HVF humphrey visual field; GVF goldmann visual field; OCT optical coherence tomography; IOP intraocular pressure; BRVO Branch retinal vein occlusion; CRVO central retinal vein occlusion; CRAO central retinal artery occlusion; BRAO branch retinal artery occlusion; RT retinal tear; SB scleral buckle; PPV pars plana vitrectomy; VH Vitreous hemorrhage; PRP panretinal laser photocoagulation; IVK intravitreal kenalog; VMT vitreomacular traction; MH Macular hole;  NVD neovascularization of the disc; NVE neovascularization elsewhere; AREDS age related eye disease study; ARMD age related macular degeneration; POAG primary open angle glaucoma; EBMD epithelial/anterior basement membrane dystrophy; ACIOL anterior chamber intraocular lens; IOL intraocular lens; PCIOL posterior chamber intraocular lens; Phaco/IOL phacoemulsification with intraocular lens placement; PRK photorefractive keratectomy; LASIK laser assisted in situ keratomileusis; HTN hypertension; DM diabetes mellitus; COPD chronic obstructive pulmonary disease

## 2023-10-27 ENCOUNTER — Ambulatory Visit (INDEPENDENT_AMBULATORY_CARE_PROVIDER_SITE_OTHER): Payer: Medicare Other | Admitting: Ophthalmology

## 2023-10-27 ENCOUNTER — Encounter (INDEPENDENT_AMBULATORY_CARE_PROVIDER_SITE_OTHER): Payer: Self-pay | Admitting: Ophthalmology

## 2023-10-27 DIAGNOSIS — H35033 Hypertensive retinopathy, bilateral: Secondary | ICD-10-CM

## 2023-10-27 DIAGNOSIS — Z7985 Long-term (current) use of injectable non-insulin antidiabetic drugs: Secondary | ICD-10-CM

## 2023-10-27 DIAGNOSIS — I10 Essential (primary) hypertension: Secondary | ICD-10-CM

## 2023-10-27 DIAGNOSIS — H3343 Traction detachment of retina, bilateral: Secondary | ICD-10-CM | POA: Diagnosis not present

## 2023-10-27 DIAGNOSIS — E113513 Type 2 diabetes mellitus with proliferative diabetic retinopathy with macular edema, bilateral: Secondary | ICD-10-CM

## 2023-10-27 DIAGNOSIS — H4313 Vitreous hemorrhage, bilateral: Secondary | ICD-10-CM | POA: Diagnosis not present

## 2023-10-27 DIAGNOSIS — Z7984 Long term (current) use of oral hypoglycemic drugs: Secondary | ICD-10-CM

## 2023-10-27 DIAGNOSIS — Z961 Presence of intraocular lens: Secondary | ICD-10-CM

## 2023-10-27 MED ORDER — FARICIMAB-SVOA 6 MG/0.05ML IZ SOSY
6.0000 mg | PREFILLED_SYRINGE | INTRAVITREAL | Status: AC | PRN
  Administered 2023-10-27: 6 mg via INTRAVITREAL

## 2023-10-27 MED ORDER — AFLIBERCEPT 2MG/0.05ML IZ SOLN FOR KALEIDOSCOPE
2.0000 mg | INTRAVITREAL | Status: AC | PRN
Start: 2023-10-27 — End: 2023-10-27
  Administered 2023-10-27: 2 mg via INTRAVITREAL

## 2023-11-24 ENCOUNTER — Ambulatory Visit (INDEPENDENT_AMBULATORY_CARE_PROVIDER_SITE_OTHER): Admitting: Internal Medicine

## 2023-11-24 VITALS — BP 130/80 | HR 83 | Ht 71.0 in | Wt 203.4 lb

## 2023-11-24 DIAGNOSIS — E1142 Type 2 diabetes mellitus with diabetic polyneuropathy: Secondary | ICD-10-CM | POA: Diagnosis not present

## 2023-11-24 DIAGNOSIS — E1165 Type 2 diabetes mellitus with hyperglycemia: Secondary | ICD-10-CM

## 2023-11-24 DIAGNOSIS — E1122 Type 2 diabetes mellitus with diabetic chronic kidney disease: Secondary | ICD-10-CM

## 2023-11-24 DIAGNOSIS — E11319 Type 2 diabetes mellitus with unspecified diabetic retinopathy without macular edema: Secondary | ICD-10-CM

## 2023-11-24 DIAGNOSIS — N186 End stage renal disease: Secondary | ICD-10-CM

## 2023-11-24 DIAGNOSIS — Z794 Long term (current) use of insulin: Secondary | ICD-10-CM | POA: Diagnosis not present

## 2023-11-24 DIAGNOSIS — Z992 Dependence on renal dialysis: Secondary | ICD-10-CM

## 2023-11-24 LAB — POCT GLYCOSYLATED HEMOGLOBIN (HGB A1C): Hemoglobin A1C: 8.3 % — AB (ref 4.0–5.6)

## 2023-11-24 MED ORDER — OMNIPOD 5 G7 INTRO (GEN 5) KIT: 1.0000 | PACK | 0 refills | Status: DC

## 2023-11-24 MED ORDER — OMNIPOD 5 G7 PODS (GEN 5) MISC: 1.0000 | 3 refills | Status: DC

## 2023-11-24 NOTE — Patient Instructions (Signed)
-   Continue Tresiba  22 units at Bedtime - Increase  Novolog 10 units before each meal  - Novolog correctional insulin: ADD extra units on insulin to your meal-time Novolog dose if your blood sugars are higher than 170. Use the scale below to help guide you:   Blood sugar before meal Number of units to inject  Less than 170 0 unit  171 -  210 1 units  211 -  250 2 units  251 -  290 3 units  291 -  330 4 units  331 -  370 5 units       HOW TO TREAT LOW BLOOD SUGARS (Blood sugar LESS THAN 70 MG/DL) Please follow the RULE OF 15 for the treatment of hypoglycemia treatment (when your (blood sugars are less than 70 mg/dL)   STEP 1: Take 15 grams of carbohydrates when your blood sugar is low, which includes:  3-4 GLUCOSE TABS  OR 3-4 OZ OF JUICE OR REGULAR SODA OR ONE TUBE OF GLUCOSE GEL    STEP 2: RECHECK blood sugar in 15 MINUTES STEP 3: If your blood sugar is still low at the 15 minute recheck --> then, go back to STEP 1 and treat AGAIN with another 15 grams of carbohydrates. Decrease Tresiba to 24 units at bedtime Decrease Humalog to 7 units 3 times daily with meals,

## 2023-11-24 NOTE — Progress Notes (Signed)
 [facility] - Clinic Note  12/08/2023    CHIEF COMPLAINT Patient presents for Retina Follow Up  HISTORY OF PRESENT ILLNESS: Randy Oneal is a 58 y.o. male who presents to the clinic today for:  HPI     Retina Follow Up   Patient presents with  Diabetic Retinopathy.  In both eyes.  This started 5 weeks ago.  Severity is moderate.  Since onset it is stable.  I, the attending physician,  performed the HPI with the patient and updated documentation appropriately.        Comments   Patient feels the vision is doing well. He is using Prolensa  OU QID and the Pred OU QID. His blood sugar was 185.       Last edited by Ronelle Coffee, MD on 12/08/2023  4:57 PM.    Pt states vision is the same  Referring physician: Bennet Brasil, MD [address] [place],  [place] [address]  HISTORICAL INFORMATION:   Selected notes from the MEDICAL RECORD NUMBER Referred by Dr.Mark Gennie Kicks for concern of decreased vision post cataract sx   CURRENT MEDICATIONS: Current Outpatient Medications (Ophthalmic Drugs)  Medication Sig   Bromfenac  Sodium (PROLENSA ) 0.07 % SOLN Place 1 drop into both eyes 4 (four) times daily.   Bromfenac  Sodium 0.07 % SOLN Place 1 drop into both eyes 4 (four) times daily. (Patient not taking: Reported on 11/24/2023)   Bromfenac  Sodium 0.07 % SOLN Place 1 drop into both eyes 4 (four) times daily. (Patient not taking: Reported on 11/24/2023)   prednisoLONE  acetate (PRED FORTE ) 1 % ophthalmic suspension PLACE (1) DROP INTO BOTH EYES FOUR TIMES DAILY   No current facility-administered medications for this visit. (Ophthalmic Drugs)   Current Outpatient Medications (Other)  Medication Sig   acetaminophen  (TYLENOL ) 500 MG tablet Take 1,000 mg by mouth every 6 (six) hours as needed for moderate pain or headache.   amoxicillin -clavulanate (AUGMENTIN ) 500-125 MG tablet Take 1 tablet by mouth 2 (two) times daily. (Patient not taking: Reported on  11/24/2023)   cinacalcet  (SENSIPAR ) 60 MG tablet Take 60 mg by mouth every Monday, Wednesday, and Friday.   doxycycline  (VIBRA -TABS) 100 MG tablet Take 1 tablet (100 mg total) by mouth every 12 (twelve) hours. (Patient not taking: Reported on 11/24/2023)   insulin  aspart (NOVOLOG  FLEXPEN) 100 UNIT/ML FlexPen 7 units with breakfast and lunch, and 8 units with supper CF NovoLog (BG -130/40)  Max daily 45 units (Patient taking differently: Inject 9-10 Units into the skin 3 (three) times daily with meals.)   insulin  degludec (TRESIBA  FLEXTOUCH) 100 UNIT/ML FlexTouch Pen Inject 24 Units into the skin at bedtime.   Insulin  Disposable Pump (OMNIPOD 5 G7 INTRO, GEN 5,) KIT 1 Device by Does not apply route every 3 (three) days.   Insulin  Disposable Pump (OMNIPOD 5 G7 PODS, GEN 5,) MISC 1 Device by Does not apply route every 3 (three) days.   lidocaine  (XYLOCAINE ) 2 % jelly Apply topically. (Patient not taking: Reported on 11/24/2023)   sucroferric oxyhydroxide (VELPHORO ) 500 MG chewable tablet Chew 1,000 mg by mouth 3 (three) times daily with meals.   Wound Cleansers (VASHE CLEANSING) SOLN Apply topically.   No current facility-administered medications for this visit. (Other)   REVIEW OF SYSTEMS: ROS   Positive for: Genitourinary, Endocrine, Eyes Negative for: Constitutional, Gastrointestinal, Neurological, Skin, Musculoskeletal, HENT, Cardiovascular, Respiratory, Psychiatric, Allergic/Imm, Heme/Lymph Last edited by Olene Berne, COT on 12/08/2023  1:50 PM.  ALLERGIES Allergies  Allergen Reactions   Tramadol  Nausea And Vomiting   PAST MEDICAL HISTORY Past Medical History:  Diagnosis Date   Anemia    Cataract 07/02/2020   Diabetes mellitus    Type 2    Diabetic retinopathy of both eyes (HCC) 01/31/2019   Dr Karyl Paget 01/2019   ESRD (end stage renal disease) (HCC)    [place] Frenisus   Herpes simplex virus (HSV) infection    OU   Hypertension    Hypertensive retinopathy    OU    Retinal detachment    OS   Sepsis due to Streptococcus, group B (HCC) 10/05/2018   Past Surgical History:  Procedure Laterality Date   25 GAUGE PARS PLANA VITRECTOMY WITH 20 GAUGE MVR PORT FOR MACULAR HOLE Right 02/24/2019   Procedure: 25 GAUGE PARS PLANA VITRECTOMY WITH 20 GAUGE MVR PORT FOR MACULAR HOLE;  Surgeon: Ronelle Coffee, MD;  Location: [facility] OR;  Service: Ophthalmology;  Laterality: Right;   APPLICATION OF WOUND VAC Left 08/27/2018   Procedure: APPLICATION OF WOUND VAC, left neck;  Surgeon: Bartley Lightning, MD;  Location: [facility] OR;  Service: Thoracic;  Laterality: Left;   APPLICATION OF WOUND VAC Left 08/30/2018   Procedure: APPLICATION OF WOUND VAC;  Surgeon: Bartley Lightning, MD;  Location: [facility] OR;  Service: Thoracic;  Laterality: Left;   AV FISTULA PLACEMENT Left 02/15/2020   Procedure: LEFT ARM ARTERIOVENOUS (AV) FISTULA CREATION;  Surgeon: Adine Hoof, MD;  Location: [facility] OR;  Service: Vascular;  Laterality: Left;   CATARACT EXTRACTION Bilateral    CATARACT EXTRACTION W/ INTRAOCULAR LENS  IMPLANT, BILATERAL     COLONOSCOPY  06/24/2011   Procedure: COLONOSCOPY;  Surgeon: Pauleen Borne, MD;  Location: [facility] SUITE;  Service: Endoscopy;  Laterality: N/A;  8:30 AM   EYE SURGERY     GAS/FLUID EXCHANGE Left 03/31/2019   Procedure: Gas/Fluid Exchange;  Surgeon: Ronelle Coffee, MD;  Location: [facility] OR;  Service: Ophthalmology;  Laterality: Left;   INJECTION OF SILICONE OIL  02/24/2019   Procedure: Injection Of Silicone Oil;  Surgeon: Ronelle Coffee, MD;  Location: [facility] OR;  Service: Ophthalmology;;   INSERTION OF DIALYSIS CATHETER Right 12/05/2019   Procedure: INSERTION OF DIALYSIS CATHETER RIGHT SUBCLAVIAN;  Surgeon: Awilda Bogus, MD;  Location: [facility] ORS;  Service: General;  Laterality: Right;   INSERTION OF DIALYSIS CATHETER Right 12/07/2019   Procedure: Minor Dialysis catheter in place- need to reposition/ adjust catheter to help with flows;  Surgeon: Awilda Bogus, MD;  Location:  [facility] ORS;  Service: General;  Laterality: Right;  procedure room case with 1% lidocaine , full sterile drape,  towels, full gown, large chloraprep, 4-0 Monocryl and dermabond    INSERTION OF DIALYSIS CATHETER Right 12/08/2019   Procedure: INSERTION OF DIALYSIS CATHETER EXCHANGE;  Surgeon: Awilda Bogus, MD;  Location: [facility] ORS;  Service: General;  Laterality: Right;   IR ANGIOGRAM EXTREMITY RIGHT  03/15/2021   IR RADIOLOGIST EVAL & MGMT  02/07/2021   IR RADIOLOGIST EVAL & MGMT  05/09/2021   IR US  GUIDE VASC ACCESS RIGHT  03/15/2021   MEMBRANE PEEL Right 02/24/2019   Procedure: Ludwig Safer;  Surgeon: Ronelle Coffee, MD;  Location: [facility] OR;  Service: Ophthalmology;  Laterality: Right;   PARS PLANA VITRECTOMY Left 03/31/2019   Procedure: PARS PLANA VITRECTOMY WITH 25 GAUGE WITH MEMBRANE PEEL;  Surgeon: Ronelle Coffee, MD;  Location: [facility] OR;  Service: Ophthalmology;  Laterality: Left;   PHOTOCOAGULATION WITH LASER Right 02/24/2019  Procedure: Photocoagulation With Laser;  Surgeon: Ronelle Coffee, MD;  Location: [facility] OR;  Service: Ophthalmology;  Laterality: Right;   PHOTOCOAGULATION WITH LASER Left 03/31/2019   Procedure: Photocoagulation With Laser;  Surgeon: Ronelle Coffee, MD;  Location: [facility] OR;  Service: Ophthalmology;  Laterality: Left;   RETINAL DETACHMENT SURGERY Left 03/31/2019   TRD Repair - Dr. Ronelle Coffee   SILICON OIL REMOVAL Right 03/31/2019   Procedure: Silicon Oil Removal;  Surgeon: Ronelle Coffee, MD;  Location: [facility] OR;  Service: Ophthalmology;  Laterality: Right;   STERNAL WOUND DEBRIDEMENT Left 08/27/2018   Procedure: Incision and DEBRIDEMENT Left Chest, Neck and Mediastinum;  Surgeon: Bartley Lightning, MD;  Location: [facility] OR;  Service: Thoracic;  Laterality: Left;   STERNAL WOUND DEBRIDEMENT Left 08/30/2018   Procedure: WOUND VAC CHANGE, LEFT CHEST AND NECK, POSSIBLE DEBRIDEMENT;  Surgeon: Bartley Lightning, MD;  Location: [facility] OR;  Service: Thoracic;  Laterality: Left;   FAMILY HISTORY Family History   Problem Relation Age of Onset   Hypertension Mother    Colon cancer Neg Hx    SOCIAL HISTORY Social History   Tobacco Use   Smoking status: Never   Smokeless tobacco: Never  Vaping Use   Vaping status: Never Used  Substance Use Topics   Alcohol use: No   Drug use: No       OPHTHALMIC EXAM:  Base Eye Exam     Visual Acuity (Snellen - Linear)       Right Left   Dist [place] 20/300 20/250   Dist ph [place] 20/150 20/150         Tonometry (Tonopen, 1:55 PM)       Right Left   Pressure 16 15         Pupils       Dark Light Shape React APD   Right 4 3 Irregular Slow None   Left 4 3 Irregular Slow None         Visual Fields       Left Right   Restrictions Partial outer superior temporal, inferior temporal deficiencies Partial outer superior temporal, inferior temporal deficiencies         Extraocular Movement       Right Left    Full, Ortho Full, Ortho         Neuro/Psych     Oriented x3: Yes   Mood/Affect: Normal         Dilation     Both eyes: 1.0% Mydriacyl , 2.5% Phenylephrine  @ 1:51 PM           Slit Lamp and Fundus Exam     Slit Lamp Exam       Right Left   Lids/Lashes mild Meibomian gland dysfunction mild Meibomian gland dysfunction   Conjunctiva/Sclera subconj silicon oil bubbles, Chemosis, conj Cyst White and quiet   Cornea Trace PEE, Well healed cataract wound, arcus, Debris in tear film Trace PEE, Well healed cataract wound, arcus, Debris in tear film   Anterior Chamber Deep and quiet, no cell or flare Deep and quiet, no cell or flare   Iris slightly irregular dilation, posterior synchiae from 3:00-1200 round, focal Posterior synechiae at 1030   Lens PC IOL in good position, pigment deposition, 1+ PCO, mild pigment deposition on optic PC IOL in good position, mild pigment deposition   Anterior Vitreous Post vit, silicon oil bubble ~85% post vitrectomy, good silicone oil fill         Fundus Exam  Right Left   Disc  2+pallor, sharp rim, mild fibrosis along SN rim +pallor, sharp rim, fine NVD -- regressed, mild fibrosis   C/D Ratio 0.2 0.2   Macula attached under oil, scattered MA/IRH -- improved, nasal and central thickening / edema attached under oil, persistent fibrosis, persistent edema, focal bands of PRF with traction emanating from ST macula -- stable, focal DBH temporal macula -- improved   Vessels Severe attenuation, Tortuous Severe attenuation, Tortuous   Periphery Attached, dense 360 PRP laser, mild scattered fibrosis - persistent, Focal ret hole along distal IT arcades -- good laser surrounding Attached, good 360 PRP laser changes, pre-retinal fibrosis extending to posterior PRP border superior and inferiorly           IMAGING AND PROCEDURES  Imaging and Procedures for @TODAY @  OCT, Retina - OU - Both Eyes       Right Eye Quality was good. Central Foveal Thickness: 647. Progression has been stable. Findings include no SRF, abnormal foveal contour, epiretinal membrane, intraretinal fluid, outer retinal atrophy, preretinal fibrosis (Persistent central cystic changes ).   Left Eye Quality was good. Central Foveal Thickness: 605. Progression has been stable. Findings include no SRF, abnormal foveal contour, intraretinal hyper-reflective material, epiretinal membrane, intraretinal fluid, preretinal fibrosis (Persistent central cystic changes ).   Notes *Images captured and stored on drive  Diagnosis / Impression:  +DME under silicon oil OU OD: persistent central cystic changes  OS: Persistent central cystic changes   Clinical management:  See below  Abbreviations: NFP - Normal foveal profile. CME - cystoid macular edema. PED - pigment epithelial detachment. IRF - intraretinal fluid. SRF - subretinal fluid. EZ - ellipsoid zone. ERM - epiretinal membrane. ORA - outer retinal atrophy. ORT - outer retinal tubulation. SRHM - subretinal hyper-reflective material      Intravitreal  Injection, Pharmacologic Agent - OD - Right Eye       Time Out 12/08/2023. 2:58 PM. Confirmed correct patient, procedure, site, and patient consented.   Anesthesia Topical anesthesia was used. Anesthetic medications included Lidocaine  2%, Proparacaine  0.5%.   Procedure Preparation included 5% betadine to ocular surface, eyelid speculum. A (32g) needle was used.   Injection: 2 mg aflibercept  2 MG/0.05ML   Route: Intravitreal, Site: Right Eye   NDC: Q956576, Lot: [id number], Expiration date: 02/07/2025, Waste: 0 mL   Post-op Post injection exam found visual acuity of at least counting fingers. The patient tolerated the procedure well. There were no complications. The patient received written and verbal post procedure care education. Post injection medications were not given.      Intravitreal Injection, Pharmacologic Agent - OS - Left Eye       Time Out 12/08/2023. 2:58 PM. Confirmed correct patient, procedure, site, and patient consented.   Anesthesia Topical anesthesia was used. Anesthetic medications included Lidocaine  2%, Proparacaine  0.5%.   Procedure Preparation included 5% betadine to ocular surface, eyelid speculum. A (32g) needle was used.   Injection: 6 mg faricimab -svoa 6 MG/0.05ML   Route: Intravitreal, Site: Left Eye   NDC: 86578-469-62, Lot: [id number], Expiration date: 12/08/2024, Waste: 0 mL   Post-op Post injection exam found visual acuity of at least counting fingers. The patient tolerated the procedure well. There were no complications. The patient received written and verbal post procedure care education. Post injection medications were not given.            ASSESSMENT/PLAN:    ICD-10-CM   1. Proliferative diabetic retinopathy of both eyes with macular edema associated with  type 2 diabetes mellitus (HCC)  E11.3513 OCT, Retina - OU - Both Eyes    Intravitreal Injection, Pharmacologic Agent - OD - Right Eye    Intravitreal Injection, Pharmacologic  Agent - OS - Left Eye    faricimab -svoa (VABYSMO ) 6mg /0.87mL intravitreal injection    aflibercept  (EYLEA ) SOLN 2 mg    2. Long term (current) use of oral hypoglycemic drugs  Z79.84     3. Long-term (current) use of injectable non-insulin  antidiabetic drugs  Z79.85     4. 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 Vitreous hemorrhage of both eyes (HCC)  H43.13     6. Essential hypertension  I10     7. Hypertensive retinopathy of both eyes  H35.033     8. Pseudophakia of both eyes  Z96.1       1-5. Proliferative diabetic retinopathy with DME, TRD, and vitreous hemorrhage OU (OD > OS)  - last A1c: 8.3 on 12.10.24  - lost to f/u from Jan 2021 to Oct 2021 due to loss of insurance coverage  - pt with complex medical history with hospitalization in Jan-Feb 2020 for bacteremia and abscess  - history of poor glycemic control for years  - s/p IVA OD #1 (06.20.20), #2 (07.01.20), #3 (09.11.20), #4 (10.09.20), #5 (11.06.20), #6 (11.24.21), #7 (12.22.21), #8 (01.19.22)  - s/p IVA OS #1 (06.20.20), #2 (07.01.20), #3 (08.13.20), #4 (09.11.20)  #5 (11.06.20), #6 (11.24.21), #7 (12.22.21), #8 (01.19.22)  =========================================== - s/p IVE OU #1 (12.04.20) - sample; #2 (01.06.21), #3 (02.18.22), #4 (03.18.22), #5 (04.18.22), #6 (05.25.22), #7 (07.01.22), #8 (07.29.22), #9 (09.02.22), #10 (09.30.22), #11 (11.04.22), #12 (12.09.23), #13 (01.13.23), #14 (03.02.23), #15 (04.11.23), #16 (06.01.23), #17 (07.11.23), #18 (09.12.23), #19 (11.14.23) - s/p IVE OD #20 (03.12.24), #21 (04.23.24), #22 (06.04.24), #23 (07.16.24), #24 (08.30.24), #25 (10.15.24), #26 (11.27.24), #27 (02.11.25), #28 (03.18.25) ============================================== - s/p IVV OS #1 (03.12.24), #2 (04.23.24), #3 (06.04.24), #4 (07.16.24), #5 (08.30.24), #6 (10.15.24), #7 (11.27.24), #8 (02.11.25), #9 (03.18.25) ==================================================  - s/p STK OS #1 (10.09.20)  - s/p  PRP OS (06.10.20)  - s/p PPV/PFC/EL/FAX/silicone oil OD, 07.16.20  - s/p subconj silicon oil removal OD 8.20.20  - s/p PPV/EL/FAX/silicone oil OS, 08.20.20  - BCVA 20/150 OU -- stable             - OCT shows OD: persistent central cystic changes; OS: Persistent central cystic changes at 6 weeks  - recommend IVE OD #29 and IVV OS #10 today, 04.29.25 with f/u in 5-6 weeks  - RBA of procedure discussed, questions answered  - IVV informed consent obtained and signed, 01.02.25 (OU)  - IVE informed consent obtained and singed, 01.02.25  - see procedure note             - Eylea  benefits investigation begun 01.19.22 -- approved for 2025  - cont topical PF and Prolensa  QID OU for possible CME component  - f/u 5-6 weeks -- DFE/OCT, possible injection(s)  6,7. Hypertensive retinopathy OU  - discussed importance of tight BP control  - continue to monitor  8. Pseudophakia OU  - s/p CE with IOL Gennie Kicks)  Ophthalmic Meds Ordered this visit:  Meds ordered this encounter  Medications   faricimab -svoa (VABYSMO ) 6mg /0.41mL intravitreal injection   aflibercept  (EYLEA ) SOLN 2 mg     Return for f/u 5-6 weeks, PDR OU, DFE, OCT, Possible Injxn.  There are no Patient Instructions on file for this visit.  This document serves as a record of services  personally performed by Jeanice Millard, MD, PhD. It was created on their behalf by Olene Berne, COT an ophthalmic technician. The creation of this record is the provider's dictation and/or activities during the visit.    Electronically signed by:  Olene Berne, COT  12/08/23 4:59 PM  This document serves as a record of services personally performed by Jeanice Millard, MD, PhD. It was created on their behalf by Morley Arabia. Bevin Bucks, OA an ophthalmic technician. The creation of this record is the provider's dictation and/or activities during the visit.    Electronically signed by: Morley Arabia. Bevin Bucks, OA 12/08/23 4:59 PM  Jeanice Millard, M.D.,  Ph.D. Diseases & Surgery of the Retina and Vitreous [facility]  I have reviewed the above documentation for accuracy and completeness, and I agree with the above. Jeanice Millard, M.D., Ph.D. 12/08/23 5:00 PM   Abbreviations: M myopia (nearsighted); A astigmatism; H hyperopia (farsighted); P presbyopia; Mrx spectacle prescription;  CTL contact lenses; OD right eye; OS left eye; OU both eyes  XT exotropia; ET esotropia; PEK punctate epithelial keratitis; PEE punctate epithelial erosions; DES dry eye syndrome; MGD meibomian gland dysfunction; ATs artificial tears; PFAT's preservative free artificial tears; NSC nuclear sclerotic cataract; PSC posterior subcapsular cataract; ERM epi-retinal membrane; PVD posterior vitreous detachment; RD retinal detachment; DM diabetes mellitus; DR diabetic retinopathy; NPDR non-proliferative diabetic retinopathy; PDR proliferative diabetic retinopathy; CSME clinically significant macular edema; DME diabetic macular edema; dbh dot blot hemorrhages; CWS cotton wool spot; POAG primary open angle glaucoma; C/D cup-to-disc ratio; HVF humphrey visual field; GVF goldmann visual field; OCT optical coherence tomography; IOP intraocular pressure; BRVO Branch retinal vein occlusion; CRVO central retinal vein occlusion; CRAO central retinal artery occlusion; BRAO branch retinal artery occlusion; RT retinal tear; SB scleral buckle; PPV pars plana vitrectomy; VH Vitreous hemorrhage; PRP panretinal laser photocoagulation; IVK intravitreal kenalog ; VMT vitreomacular traction; MH Macular hole;  NVD neovascularization of the disc; NVE neovascularization elsewhere; AREDS age related eye disease study; ARMD age related macular degeneration; POAG primary open angle glaucoma; EBMD epithelial/anterior basement membrane dystrophy; ACIOL anterior chamber intraocular lens; IOL intraocular lens; PCIOL posterior chamber intraocular lens; Phaco/IOL phacoemulsification with  intraocular lens placement; PRK photorefractive keratectomy; LASIK laser assisted in situ keratomileusis; HTN hypertension; DM diabetes mellitus; COPD chronic obstructive pulmonary disease

## 2023-11-24 NOTE — Progress Notes (Signed)
 Name: Randy Oneal  Age/ Sex: 58 y.o., male   MRN/ DOB: [id number], 05-03-1966     PCP: [name], MD   Reason for Endocrinology Evaluation: Type 2 Diabetes Mellitus  Initial Endocrine Consultative Visit: 10/20/2018    PATIENT IDENTIFIER: Randy Oneal is a 58 y.o. male with a past medical history of T2DM,HTN, and ESRD  . The patient has followed with Endocrinology clinic since  for consultative assistance with management of his diabetes.     DIABETIC HISTORY:  Randy Oneal was diagnosed with DM in 2000.  He has been on Metformin, historically his control has been poor due to non-compliance but pt became motivated after his prolonged hospitalization for chest wall infection in [place], 2020.  He has been on insulin therapy for years. His hemoglobin A1c has ranged from 13.0% in 08/2018, peaking at 16.8% in 2017    On his initial presentation to our clinic his A1c 13.0% and he was on Tresiba and Humalog.   SUBJECTIVE:   During the last visit (01/13/2023): A1c 8.0%      Today (11/24/2023): Randy Oneal is here for a follow up on diabetes.  He is accompanied by his wife today. He checks his blood sugars multiple times daily through Dexcom G7.   He is currently following up with the wound clinic through [facility] for diabetic foot ulcer. 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, receives intravitreal injections  Has been accepted for pancreatic/renal transplant   HD day Monday, Wednesday and Friday   Denies nausea or vomiting  Denies constipation or diarrhea     HOME DIABETES REGIMEN:  Tresiba 24 units daily - takes 22 units Novolog 8 units with each meal CF: NovoLog(BG -130/40)   Statin: Yes ACE-I/ARB: on ESRD    CONTINUOUS GLUCOSE MONITORING RECORD INTERPRETATION    Dates of Recording: 4/2-4/15/2025  Sensor description:dexcom  Results statistics:   CGM use % of time 90  Average and SD 200/68  Time in range 40 %  %  Time Above 180 37  % Time above 250 23  % Time Below target 0    Glycemic patterns summary: BG's remain elevated overnight and fluctuate during the day  Hypoglycemic episodes occurred N/A  Overnight periods: High     DIABETIC COMPLICATIONS: Microvascular complications:  Retinopathy, neuropathy, ESRD Last Eye Exam: Completed 05/26/2023  Macrovascular complications:   Denies: CAD, CVA, PVD   HISTORY:  Past Medical History:  Past Medical History:  Diagnosis Date   Anemia    Cataract 07/02/2020   Diabetes mellitus    Type 2    Diabetic retinopathy of both eyes (HCC) 01/31/2019   [name] 01/2019   ESRD (end stage renal disease) (HCC)    [place] Frenisus   Herpes simplex virus (HSV) infection    OU   Hypertension    Hypertensive retinopathy    OU   Retinal detachment    OS   Sepsis due to Streptococcus, group B (HCC) 10/05/2018   Past Surgical History:  Past Surgical History:  Procedure Laterality Date   25 GAUGE PARS PLANA VITRECTOMY WITH 20 GAUGE MVR PORT FOR MACULAR HOLE Right 02/24/2019   Procedure: 25 GAUGE PARS PLANA VITRECTOMY WITH 20 GAUGE MVR PORT FOR MACULAR HOLE;  Surgeon: [name], MD;  Location: [facility] OR;  Service: Ophthalmology;  Laterality: Right;   APPLICATION OF WOUND VAC Left 08/27/2018   Procedure: APPLICATION OF WOUND VAC, left neck;  Surgeon: [name],  MD;  Location: [facility] OR;  Service: Thoracic;  Laterality: Left;   APPLICATION OF WOUND VAC Left 08/30/2018   Procedure: APPLICATION OF WOUND VAC;  Surgeon: [name], MD;  Location: [facility] OR;  Service: Thoracic;  Laterality: Left;   AV FISTULA PLACEMENT Left 02/15/2020   Procedure: LEFT ARM ARTERIOVENOUS (AV) FISTULA CREATION;  Surgeon: [name], MD;  Location: [facility] OR;  Service: Vascular;  Laterality: Left;   CATARACT EXTRACTION Bilateral    CATARACT EXTRACTION W/ INTRAOCULAR LENS  IMPLANT, BILATERAL     COLONOSCOPY  06/24/2011   Procedure: COLONOSCOPY;  Surgeon: [name], MD;  Location: [facility] SUITE;  Service: Endoscopy;  Laterality: N/A;  8:30 AM   EYE SURGERY     GAS/FLUID EXCHANGE Left 03/31/2019   Procedure: Gas/Fluid Exchange;  Surgeon: [name], MD;  Location: [facility] OR;  Service: Ophthalmology;  Laterality: Left;   INJECTION OF SILICONE OIL  02/24/2019   Procedure: Injection Of Silicone Oil;  Surgeon: [name], MD;  Location: [facility] OR;  Service: Ophthalmology;;   INSERTION OF DIALYSIS CATHETER Right 12/05/2019   Procedure: INSERTION OF DIALYSIS CATHETER RIGHT SUBCLAVIAN;  Surgeon: [name], MD;  Location: [facility] ORS;  Service: General;  Laterality: Right;   INSERTION OF DIALYSIS CATHETER Right 12/07/2019   Procedure: Minor Dialysis catheter in place- need to reposition/ adjust catheter to help with flows;  Surgeon: [name], MD;  Location: [facility] ORS;  Service: General;  Laterality: Right;  procedure room case with 1% lidocaine, full sterile drape,  towels, full gown, large chloraprep, 4-0 Monocryl and dermabond    INSERTION OF DIALYSIS CATHETER Right 12/08/2019   Procedure: INSERTION OF DIALYSIS CATHETER EXCHANGE;  Surgeon: [name], MD;  Location: [facility] ORS;  Service: General;  Laterality: Right;   IR ANGIOGRAM EXTREMITY RIGHT  03/15/2021   IR RADIOLOGIST EVAL & MGMT  02/07/2021   IR RADIOLOGIST EVAL & MGMT  05/09/2021   IR US GUIDE VASC ACCESS RIGHT  03/15/2021   MEMBRANE PEEL Right 02/24/2019   Procedure: [place]ula Flax;  Surgeon: [name], MD;  Location: [facility] OR;  Service: Ophthalmology;  Laterality: Right;   PARS PLANA VITRECTOMY Left 03/31/2019   Procedure: PARS PLANA VITRECTOMY WITH 25 GAUGE WITH MEMBRANE PEEL;  Surgeon: [name], MD;  Location: [facility] OR;  Service: Ophthalmology;  Laterality: Left;   PHOTOCOAGULATION WITH LASER Right 02/24/2019   Procedure: Photocoagulation With Laser;  Surgeon: [name], MD;  Location: [facility] OR;  Service: Ophthalmology;  Laterality: Right;   PHOTOCOAGULATION WITH LASER Left 03/31/2019    Procedure: Photocoagulation With Laser;  Surgeon: [name], MD;  Location: [facility] OR;  Service: Ophthalmology;  Laterality: Left;   RETINAL DETACHMENT SURGERY Left 03/31/2019   TRD Repair - Dr. [name]   SILICON OIL REMOVAL Right 03/31/2019   Procedure: Silicon Oil Removal;  Surgeon: [name], MD;  Location: [facility] OR;  Service: Ophthalmology;  Laterality: Right;   STERNAL WOUND DEBRIDEMENT Left 08/27/2018   Procedure: Incision and DEBRIDEMENT Left Chest, Neck and Mediastinum;  Surgeon: [name], MD;  Location: [facility] OR;  Service: Thoracic;  Laterality: Left;   STERNAL WOUND DEBRIDEMENT Left 08/30/2018   Procedure: WOUND VAC CHANGE, LEFT CHEST AND NECK, POSSIBLE DEBRIDEMENT;  Surgeon: [name], MD;  Location: [facility] OR;  Service: Thoracic;  Laterality: Left;   Social History:  reports that he has never smoked. He has never used smokeless tobacco. He reports that he does not drink alcohol and does  not use drugs. Family History:  Family History  Problem Relation Age of Onset   Hypertension Mother    Colon cancer Neg Hx      HOME MEDICATIONS: Allergies as of 11/24/2023       Reactions   Tramadol Nausea And Vomiting        Medication List        Accurate as of November 24, 2023  1:59 PM. If you have any questions, ask your nurse or doctor.          acetaminophen 500 MG tablet Commonly known as: TYLENOL Take 1,000 mg by mouth every 6 (six) hours as needed for moderate pain or headache.   amoxicillin-clavulanate 500-125 MG tablet Commonly known as: AUGMENTIN Take 1 tablet by mouth 2 (two) times daily.   Bromfenac Sodium 0.07 % Soln Commonly known as: Prolensa Place 1 drop into both eyes 4 (four) times daily.   Bromfenac Sodium 0.07 % Soln Place 1 drop into both eyes 4 (four) times daily.   Bromfenac Sodium 0.07 % Soln Place 1 drop into both eyes 4 (four) times daily.   cinacalcet 60 MG tablet Commonly known as: SENSIPAR Take 60 mg by mouth every Monday,  Wednesday, and Friday.   doxycycline 100 MG tablet Commonly known as: VIBRA-TABS Take 1 tablet (100 mg total) by mouth every 12 (twelve) hours.   lidocaine 2 % jelly Commonly known as: XYLOCAINE Apply topically.   NovoLOG FlexPen 100 UNIT/ML FlexPen Generic drug: insulin aspart 7 units with breakfast and lunch, and 8 units with supper CF NovoLog(BG -130/40)  Max daily 45 units What changed:  how much to take how to take this when to take this additional instructions   prednisoLONE acetate 1 % ophthalmic suspension Commonly known as: PRED FORTE PLACE (1) DROP INTO BOTH EYES FOUR TIMES DAILY   Tresiba FlexTouch 100 UNIT/ML FlexTouch Pen Generic drug: insulin degludec Inject 24 Units into the skin at bedtime.   Vashe Cleansing Soln Apply topically.   Velphoro 500 MG chewable tablet Generic drug: sucroferric oxyhydroxide Chew 1,000 mg by mouth 3 (three) times daily with meals.         OBJECTIVE:   Vital Signs: BP 130/80   Pulse 83   Ht 5\' 11"  (1.803 m)   Wt 203 lb 6.4 oz (92.3 kg)   SpO2 98%   BMI 28.37 kg/m   Wt Readings from Last 3 Encounters:  11/24/23 203 lb 6.4 oz (92.3 kg)  07/21/23 200 lb 6.4 oz (90.9 kg)  06/15/23 192 lb 7.4 oz (87.3 kg)     Exam: General: Pt appears well and is in NAD  Lungs: Clear with good BS bilat   Heart: RRR   Neuro: MS is good with appropriate affect, pt is alert and Ox3       DATA REVIEWED:  Lab Results  Component Value Date   HGBA1C 8.3 (A) 07/21/2023   HGBA1C 8.0 (A) 01/13/2023   HGBA1C >15.0 08/27/2022    Latest Reference Range & Units 07/31/23 14:25  Sodium 135 - 146 mmol/L 139  Potassium 3.5 - 5.3 mmol/L 4.7  Chloride 98 - 110 mmol/L 91 (L)  CO2 20 - 32 mmol/L 36 (H)  Glucose 65 - 99 mg/dL 75  BUN 7 - 25 mg/dL 23  Creatinine 1.61 - 0.96 mg/dL 0.45 (H)  Calcium 8.6 - 10.3 mg/dL 9.9  BUN/Creatinine Ratio 6 - 22 (calc) 3 (L)    Latest Reference Range & Units 07/31/23 14:25  Glucose  65 - 99 mg/dL 75   C-Peptide 6.21 - 3.08 ng/mL 1.94    ASSESSMENT / PLAN / RECOMMENDATIONS:   1) Type 2 Diabetes Mellitus, Poorly Controlled , With Retinopathic, neuropathic complications and ESRD  - Most recent A1c of 8.3 %. Goal A1c < 7.0 %.    -A1c remains stable but above goal at 8.3% -He continues to use less basal insulin than previously prescribed, patient has been noted with postprandial hyperglycemia on CGM download, I will increase his NovoLog as below -I have recommended insulin pump technology again, we will proceed with OmniPod at this time as the bionic pump is not covered for T2DM -A referral for training to our CDE has been placed   MEDICATIONS: - Continue   Tresiba 22  units at Bedtime - Increase NovoLog to 10 units 3 times daily before every meal - Continue  CF: NovoLog(BG -130/40) before meals and bedtime    EDUCATION / INSTRUCTIONS: BG monitoring instructions: Patient is instructed to check his blood sugars 3 times a day, before meals. Call [facility] if: BG persistently < 70 I reviewed the Rule of 15 for the treatment of hypoglycemia in detail with the patient. Literature supplied.    2) Diabetic complications:  Eye: Does  have known diabetic retinopathy.  Neuro/ Feet: Does  have known diabetic peripheral neuropathy .  Renal: Patient does ave known baseline CKD. He   is not on an ACEI/ARB at present.     F/U in 6 months   Signed electronically by: Natale Bail, MD  [facility] Endocrinology  [facility] [address] [place] [place], [place] [address] Phone: [phone] FAX: [phone]   CC: Bennet Brasil, MD [address] [place] [address] Phone: [phone]  Fax: [phone]  Return to Endocrinology clinic as below: Future Appointments  Date Time Provider Department Center  12/08/2023  1:45 PM Ronelle Coffee, MD [facility] None

## 2023-11-26 ENCOUNTER — Other Ambulatory Visit: Payer: Self-pay

## 2023-11-26 ENCOUNTER — Encounter: Payer: Self-pay | Admitting: Internal Medicine

## 2023-11-26 MED ORDER — OMNIPOD 5 G7 PODS (GEN 5) MISC: 1.0000 | 3 refills | Status: DC

## 2023-11-26 MED ORDER — OMNIPOD 5 G7 INTRO (GEN 5) KIT: 1.0000 | PACK | 0 refills | Status: DC

## 2023-11-30 ENCOUNTER — Telehealth: Payer: Self-pay

## 2023-11-30 NOTE — Telephone Encounter (Signed)
*  Endo  Pharmacy Patient Advocate Encounter   Received notification from Fax that prior authorization for OmniPod VGO is required/requested.   Insurance verification completed.   The patient is insured through  [facility] [employer]  .   Per test claim: PA required; PA submitted to above mentioned insurance via [employer] Key/confirmation #/EOC   Status is pending

## 2023-12-01 ENCOUNTER — Ambulatory Visit: Payer: Medicare Other | Admitting: Internal Medicine

## 2023-12-02 NOTE — Telephone Encounter (Signed)
 Any update on PA?

## 2023-12-04 ENCOUNTER — Other Ambulatory Visit (HOSPITAL_COMMUNITY): Payer: Self-pay

## 2023-12-04 NOTE — Telephone Encounter (Signed)
 No update as of yet. This may take longer as this is through the [facility] pharmacies to send to the plan for coverage.

## 2023-12-08 ENCOUNTER — Ambulatory Visit (INDEPENDENT_AMBULATORY_CARE_PROVIDER_SITE_OTHER): Admitting: Ophthalmology

## 2023-12-08 ENCOUNTER — Encounter (INDEPENDENT_AMBULATORY_CARE_PROVIDER_SITE_OTHER): Payer: Self-pay | Admitting: Ophthalmology

## 2023-12-08 DIAGNOSIS — H35033 Hypertensive retinopathy, bilateral: Secondary | ICD-10-CM | POA: Diagnosis not present

## 2023-12-08 DIAGNOSIS — E113513 Type 2 diabetes mellitus with proliferative diabetic retinopathy with macular edema, bilateral: Secondary | ICD-10-CM

## 2023-12-08 DIAGNOSIS — Z7984 Long term (current) use of oral hypoglycemic drugs: Secondary | ICD-10-CM

## 2023-12-08 DIAGNOSIS — I10 Essential (primary) hypertension: Secondary | ICD-10-CM

## 2023-12-08 DIAGNOSIS — H3343 Traction detachment of retina, bilateral: Secondary | ICD-10-CM | POA: Diagnosis not present

## 2023-12-08 DIAGNOSIS — H4313 Vitreous hemorrhage, bilateral: Secondary | ICD-10-CM

## 2023-12-08 DIAGNOSIS — Z7985 Long-term (current) use of injectable non-insulin antidiabetic drugs: Secondary | ICD-10-CM

## 2023-12-08 DIAGNOSIS — Z961 Presence of intraocular lens: Secondary | ICD-10-CM

## 2023-12-08 MED ORDER — AFLIBERCEPT 2MG/0.05ML IZ SOLN FOR KALEIDOSCOPE
2.0000 mg | INTRAVITREAL | Status: AC | PRN
  Administered 2023-12-08: 2 mg via INTRAVITREAL

## 2023-12-08 MED ORDER — FARICIMAB-SVOA 6 MG/0.05ML IZ SOSY
6.0000 mg | PREFILLED_SYRINGE | INTRAVITREAL | Status: AC | PRN
  Administered 2023-12-08: 6 mg via INTRAVITREAL

## 2023-12-09 ENCOUNTER — Other Ambulatory Visit (INDEPENDENT_AMBULATORY_CARE_PROVIDER_SITE_OTHER): Payer: Self-pay

## 2023-12-09 ENCOUNTER — Telehealth (INDEPENDENT_AMBULATORY_CARE_PROVIDER_SITE_OTHER): Payer: Self-pay

## 2023-12-09 MED ORDER — KETOROLAC TROMETHAMINE 0.5 % OP SOLN: 1.0000 [drp] | Freq: Four times a day (QID) | OPHTHALMIC | 3 refills | Status: DC

## 2023-12-15 ENCOUNTER — Telehealth: Payer: Self-pay

## 2023-12-15 NOTE — Telephone Encounter (Signed)
 Spoke with Randy Oneal at [facility] and he states that PA is still needed on PODS. I did advise per last message for authorization team they were suppose to process. I can him the Key Code that was in the message. This has been delayed for some time now. If we can look into this to see if we can get this to go through

## 2023-12-16 ENCOUNTER — Other Ambulatory Visit (HOSPITAL_COMMUNITY): Payer: Self-pay

## 2023-12-16 ENCOUNTER — Telehealth: Payer: Self-pay

## 2023-12-16 NOTE — Telephone Encounter (Signed)
 Pharmacy Patient Advocate Encounter   Received notification from Pt Calls Messages that prior authorization for Omnipod is required/requested.   Insurance verification completed.   The patient is insured through [employer] .   Per test claim: PA required and submitted KEY/EOC/Request #: [id number] APPROVED from 08/12/23 to 08/10/2024    PA Case ID #: [id number]

## 2023-12-17 NOTE — Telephone Encounter (Signed)
 Fax has been sent to [facility] to process prescription

## 2023-12-21 NOTE — Telephone Encounter (Signed)
  Pt was making us  aware [facility] let him know that Bromfenac  gtt was not covered. I personally called the pharmacy and had them run the brand, Prolensa  which was non-formulary. Sent in Ketorolac  QID OU as covered generic, pt made aware. MS

## 2023-12-31 NOTE — Progress Notes (Signed)
 [facility] - Clinic Note  01/12/2024    CHIEF COMPLAINT Patient presents for Retina Follow Up  HISTORY OF PRESENT ILLNESS: Randy Oneal is a 58 y.o. male who presents to the clinic today for:  HPI     Retina Follow Up   Patient presents with  Diabetic Retinopathy.  In both eyes.  This started 5 weeks ago.  I, the attending physician,  performed the HPI with the patient and updated documentation appropriately.        Comments   Patient here for 5 weeks retina follow up for PDR OU. Patient states vision about the same. No eye pain.       Last edited by Ronelle Coffee, MD on 01/16/2024 10:04 AM.     Patient states vision the same OU  Referring physician: Bennet Brasil, MD [address] [place],  [place] [address]  HISTORICAL INFORMATION:   Selected notes from the MEDICAL RECORD NUMBER Referred by Dr.Mark Gennie Kicks for concern of decreased vision post cataract sx   CURRENT MEDICATIONS: Current Outpatient Medications (Ophthalmic Drugs)  Medication Sig   Bromfenac  Sodium (PROLENSA ) 0.07 % SOLN Place 1 drop into both eyes 4 (four) times daily.   Bromfenac  Sodium 0.07 % SOLN Place 1 drop into both eyes 4 (four) times daily. (Patient not taking: Reported on 10/27/2023)   Bromfenac  Sodium 0.07 % SOLN Place 1 drop into both eyes 4 (four) times daily. (Patient not taking: Reported on 10/27/2023)   ketorolac  (ACULAR ) 0.5 % ophthalmic solution Place 1 drop into both eyes 4 (four) times daily.   prednisoLONE  acetate (PRED FORTE ) 1 % ophthalmic suspension PLACE (1) DROP INTO BOTH EYES FOUR TIMES DAILY   No current facility-administered medications for this visit. (Ophthalmic Drugs)   Current Outpatient Medications (Other)  Medication Sig   acetaminophen  (TYLENOL ) 500 MG tablet Take 1,000 mg by mouth every 6 (six) hours as needed for moderate pain or headache.   cinacalcet  (SENSIPAR ) 60 MG tablet Take 60 mg by mouth every Monday, Wednesday, and Friday.    insulin  aspart (NOVOLOG  FLEXPEN) 100 UNIT/ML FlexPen 7 units with breakfast and lunch, and 8 units with supper CF NovoLog (BG -130/40)  Max daily 45 units (Patient taking differently: Inject 9-10 Units into the skin 3 (three) times daily with meals.)   insulin  degludec (TRESIBA  FLEXTOUCH) 100 UNIT/ML FlexTouch Pen Inject 24 Units into the skin at bedtime.   Insulin  Disposable Pump (OMNIPOD 5 G7 INTRO, GEN 5,) KIT 1 Device by Does not apply route every 3 (three) days.   Insulin  Disposable Pump (OMNIPOD 5 G7 PODS, GEN 5,) MISC 1 Device by Does not apply route every 3 (three) days.   sucroferric oxyhydroxide (VELPHORO ) 500 MG chewable tablet Chew 1,000 mg by mouth 3 (three) times daily with meals.   Wound Cleansers (VASHE CLEANSING) SOLN Apply topically.   amoxicillin -clavulanate (AUGMENTIN ) 500-125 MG tablet Take 1 tablet by mouth 2 (two) times daily. (Patient not taking: Reported on 07/21/2023)   doxycycline  (VIBRA -TABS) 100 MG tablet Take 1 tablet (100 mg total) by mouth every 12 (twelve) hours. (Patient not taking: Reported on 07/21/2023)   lidocaine  (XYLOCAINE ) 2 % jelly Apply topically. (Patient not taking: Reported on 10/27/2023)   No current facility-administered medications for this visit. (Other)   REVIEW OF SYSTEMS: ROS   Positive for: Genitourinary, Endocrine, Eyes Negative for: Constitutional, Gastrointestinal, Neurological, Skin, Musculoskeletal, HENT, Cardiovascular, Respiratory, Psychiatric, Allergic/Imm, Heme/Lymph Last edited by Sylvan Evener, COA on 01/12/2024  2:13  PM.      ALLERGIES Allergies  Allergen Reactions   Tramadol  Nausea And Vomiting   PAST MEDICAL HISTORY Past Medical History:  Diagnosis Date   Anemia    Cataract 07/02/2020   Diabetes mellitus    Type 2    Diabetic retinopathy of both eyes (HCC) 01/31/2019   Dr Karyl Paget 01/2019   ESRD (end stage renal disease) (HCC)    [place] Frenisus   Herpes simplex virus (HSV) infection    OU   Hypertension     Hypertensive retinopathy    OU   Retinal detachment    OS   Sepsis due to Streptococcus, group B (HCC) 10/05/2018   Past Surgical History:  Procedure Laterality Date   25 GAUGE PARS PLANA VITRECTOMY WITH 20 GAUGE MVR PORT FOR MACULAR HOLE Right 02/24/2019   Procedure: 25 GAUGE PARS PLANA VITRECTOMY WITH 20 GAUGE MVR PORT FOR MACULAR HOLE;  Surgeon: Ronelle Coffee, MD;  Location: [facility] OR;  Service: Ophthalmology;  Laterality: Right;   APPLICATION OF WOUND VAC Left 08/27/2018   Procedure: APPLICATION OF WOUND VAC, left neck;  Surgeon: Bartley Lightning, MD;  Location: [facility] OR;  Service: Thoracic;  Laterality: Left;   APPLICATION OF WOUND VAC Left 08/30/2018   Procedure: APPLICATION OF WOUND VAC;  Surgeon: Bartley Lightning, MD;  Location: [facility] OR;  Service: Thoracic;  Laterality: Left;   AV FISTULA PLACEMENT Left 02/15/2020   Procedure: LEFT ARM ARTERIOVENOUS (AV) FISTULA CREATION;  Surgeon: Adine Hoof, MD;  Location: [facility] OR;  Service: Vascular;  Laterality: Left;   CATARACT EXTRACTION Bilateral    CATARACT EXTRACTION W/ INTRAOCULAR LENS  IMPLANT, BILATERAL     COLONOSCOPY  06/24/2011   Procedure: COLONOSCOPY;  Surgeon: Pauleen Borne, MD;  Location: [facility] SUITE;  Service: Endoscopy;  Laterality: N/A;  8:30 AM   EYE SURGERY     GAS/FLUID EXCHANGE Left 03/31/2019   Procedure: Gas/Fluid Exchange;  Surgeon: Ronelle Coffee, MD;  Location: [facility] OR;  Service: Ophthalmology;  Laterality: Left;   INJECTION OF SILICONE OIL  02/24/2019   Procedure: Injection Of Silicone Oil;  Surgeon: Ronelle Coffee, MD;  Location: [facility] OR;  Service: Ophthalmology;;   INSERTION OF DIALYSIS CATHETER Right 12/05/2019   Procedure: INSERTION OF DIALYSIS CATHETER RIGHT SUBCLAVIAN;  Surgeon: Awilda Bogus, MD;  Location: [facility] ORS;  Service: General;  Laterality: Right;   INSERTION OF DIALYSIS CATHETER Right 12/07/2019   Procedure: Minor Dialysis catheter in place- need to reposition/ adjust catheter to help with flows;  Surgeon:  Awilda Bogus, MD;  Location: [facility] ORS;  Service: General;  Laterality: Right;  procedure room case with 1% lidocaine , full sterile drape,  towels, full gown, large chloraprep, 4-0 Monocryl and dermabond    INSERTION OF DIALYSIS CATHETER Right 12/08/2019   Procedure: INSERTION OF DIALYSIS CATHETER EXCHANGE;  Surgeon: Awilda Bogus, MD;  Location: [facility] ORS;  Service: General;  Laterality: Right;   IR ANGIOGRAM EXTREMITY RIGHT  03/15/2021   IR RADIOLOGIST EVAL & MGMT  02/07/2021   IR RADIOLOGIST EVAL & MGMT  05/09/2021   IR US  GUIDE VASC ACCESS RIGHT  03/15/2021   MEMBRANE PEEL Right 02/24/2019   Procedure: Ludwig Safer;  Surgeon: Ronelle Coffee, MD;  Location: [facility] OR;  Service: Ophthalmology;  Laterality: Right;   PARS PLANA VITRECTOMY Left 03/31/2019   Procedure: PARS PLANA VITRECTOMY WITH 25 GAUGE WITH MEMBRANE PEEL;  Surgeon: Ronelle Coffee, MD;  Location: [facility] OR;  Service: Ophthalmology;  Laterality: Left;  PHOTOCOAGULATION WITH LASER Right 02/24/2019   Procedure: Photocoagulation With Laser;  Surgeon: Ronelle Coffee, MD;  Location: [facility] OR;  Service: Ophthalmology;  Laterality: Right;   PHOTOCOAGULATION WITH LASER Left 03/31/2019   Procedure: Photocoagulation With Laser;  Surgeon: Ronelle Coffee, MD;  Location: [facility] OR;  Service: Ophthalmology;  Laterality: Left;   RETINAL DETACHMENT SURGERY Left 03/31/2019   TRD Repair - Dr. Ronelle Coffee   SILICON OIL REMOVAL Right 03/31/2019   Procedure: Silicon Oil Removal;  Surgeon: Ronelle Coffee, MD;  Location: [facility] OR;  Service: Ophthalmology;  Laterality: Right;   STERNAL WOUND DEBRIDEMENT Left 08/27/2018   Procedure: Incision and DEBRIDEMENT Left Chest, Neck and Mediastinum;  Surgeon: Bartley Lightning, MD;  Location: [facility] OR;  Service: Thoracic;  Laterality: Left;   STERNAL WOUND DEBRIDEMENT Left 08/30/2018   Procedure: WOUND VAC CHANGE, LEFT CHEST AND NECK, POSSIBLE DEBRIDEMENT;  Surgeon: Bartley Lightning, MD;  Location: [facility] OR;  Service: Thoracic;  Laterality: Left;    FAMILY HISTORY Family History  Problem Relation Age of Onset   Hypertension Mother    Colon cancer Neg Hx    SOCIAL HISTORY Social History   Tobacco Use   Smoking status: Never   Smokeless tobacco: Never  Vaping Use   Vaping status: Never Used  Substance Use Topics   Alcohol use: No   Drug use: No       OPHTHALMIC EXAM:  Base Eye Exam     Visual Acuity (Snellen - Linear)       Right Left   Dist [place] 20/200 -1 20/200 +1   Dist ph [place] 20/150 +1 20/150         Tonometry (Tonopen, 2:10 PM)       Right Left   Pressure 19 19         Pupils       Dark Light Shape React APD   Right 4 3 Irregular Slow None   Left 4 3 Irregular Slow None         Visual Fields (Counting fingers)       Left Right   Restrictions Partial outer superior temporal, inferior temporal deficiencies Partial outer superior temporal, inferior temporal deficiencies         Extraocular Movement       Right Left    Full, Ortho Full, Ortho         Neuro/Psych     Oriented x3: Yes   Mood/Affect: Normal         Dilation     Both eyes: 1.0% Mydriacyl , 2.5% Phenylephrine  @ 2:10 PM           Slit Lamp and Fundus Exam     Slit Lamp Exam       Right Left   Lids/Lashes mild Meibomian gland dysfunction mild Meibomian gland dysfunction   Conjunctiva/Sclera subconj silicon oil bubbles, Chemosis, conj Cyst White and quiet   Cornea Trace PEE, Well healed cataract wound, arcus, Debris in tear film Trace PEE, Well healed cataract wound, arcus, Debris in tear film   Anterior Chamber Deep and quiet, no cell or flare Deep and quiet, no cell or flare   Iris slightly irregular dilation, posterior synchiae from 3:00-1200 round, focal Posterior synechiae at 1030   Lens PC IOL in good position, pigment deposition, 1+ PCO, mild pigment deposition on optic PC IOL in good position, mild pigment deposition   Anterior Vitreous Post vit, silicon oil bubble ~85% post vitrectomy, good silicone  oil fill  Fundus Exam       Right Left   Disc 2+pallor, sharp rim, mild fibrosis along SN rim +pallor, sharp rim, fine NVD -- regressed, mild fibrosis   C/D Ratio 0.2 0.2   Macula attached under oil, scattered MA/IRH -- improved, nasal and central thickening / edema attached under oil, persistent fibrosis, persistent edema, focal bands of PRF with traction emanating from ST macula -- stable, focal DBH temporal macula -- improved   Vessels Severe attenuation, Tortuous Severe attenuation, Tortuous   Periphery Attached, dense 360 PRP laser, mild scattered fibrosis - persistent, Focal ret hole along distal IT arcades -- good laser surrounding Attached, good 360 PRP laser changes, pre-retinal fibrosis extending to posterior PRP border superior and inferiorly           IMAGING AND PROCEDURES  Imaging and Procedures for @TODAY @  OCT, Retina - OU - Both Eyes        Right Eye Quality was good. Central Foveal Thickness: 621. Progression has improved. Findings include no SRF, abnormal foveal contour, epiretinal membrane, intraretinal fluid, outer retinal atrophy, preretinal fibrosis (Interval improvement in central cystic changes ).   Left Eye Quality was good. Central Foveal Thickness: 503. Progression has improved. Findings include no SRF, abnormal foveal contour, intraretinal hyper-reflective material, epiretinal membrane, intraretinal fluid, preretinal fibrosis (Interval improvement in central cystic changes ).   Notes  *Images captured and stored on drive  Diagnosis / Impression:  +DME under silicon oil OU OD: interval improvement in  central cystic changes  OS: interval improvement in  central cystic changes   Clinical management:  See below  Abbreviations: NFP - Normal foveal profile. CME - cystoid macular edema. PED - pigment epithelial detachment. IRF - intraretinal fluid. SRF - subretinal fluid. EZ - ellipsoid zone. ERM - epiretinal membrane. ORA - outer retinal  atrophy. ORT - outer retinal tubulation. SRHM - subretinal hyper-reflective material      Intravitreal Injection, Pharmacologic Agent - OD - Right Eye       Time Out 01/12/2024. 3:22 PM. Confirmed correct patient, procedure, site, and patient consented.   Anesthesia Topical anesthesia was used. Anesthetic medications included Lidocaine  2%, Proparacaine  0.5%.   Procedure Preparation included 5% betadine to ocular surface, eyelid speculum. A (32g) needle was used.   Injection: 2 mg aflibercept  2 MG/0.05ML   Route: Intravitreal, Site: Right Eye   NDC: D2246706, Lot: [id number], Expiration date: 03/10/2025, Waste: 0 mL   Post-op Post injection exam found visual acuity of at least counting fingers. The patient tolerated the procedure well. There were no complications. The patient received written and verbal post procedure care education. Post injection medications were not given.      Intravitreal Injection, Pharmacologic Agent - OS - Left Eye       Time Out 01/12/2024. 3:22 PM. Confirmed correct patient, procedure, site, and patient consented.   Anesthesia Topical anesthesia was used. Anesthetic medications included Lidocaine  2%, Proparacaine  0.5%.   Procedure Preparation included 5% betadine to ocular surface, eyelid speculum. A (32g) needle was used.   Injection: 6 mg faricimab -svoa 6 MG/0.05ML   Route: Intravitreal, Site: Left Eye   NDC: 09811-914-78, Lot: [id number], Expiration date: 01/08/2025, Waste: 0 mL   Post-op Post injection exam found visual acuity of at least counting fingers. The patient tolerated the procedure well. There were no complications. The patient received written and verbal post procedure care education. Post injection medications were not given.             ASSESSMENT/PLAN:  ICD-10-CM   1. Proliferative diabetic retinopathy of both eyes with macular edema associated with type 2 diabetes mellitus (HCC)  E11.3513 OCT, Retina - OU - Both Eyes     Intravitreal Injection, Pharmacologic Agent - OD - Right Eye    Intravitreal Injection, Pharmacologic Agent - OS - Left Eye    faricimab -svoa (VABYSMO ) 6mg /0.80mL intravitreal injection    aflibercept  (EYLEA ) SOLN 2 mg    2. Long term (current) use of oral hypoglycemic drugs  Z79.84     3. Long-term (current) use of injectable non-insulin  antidiabetic drugs  Z79.85     4. 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 Vitreous hemorrhage of both eyes (HCC)  H43.13     6. Essential hypertension  I10     7. Hypertensive retinopathy of both eyes  H35.033     8. Pseudophakia of both eyes  Z96.1      1-5. Proliferative diabetic retinopathy with DME, TRD, and vitreous hemorrhage OU (OD > OS)  - last A1c: 8.3 on 12.10.24  - lost to f/u from Jan 2021 to Oct 2021 due to loss of insurance coverage  - pt with complex medical history with hospitalization in Jan-Feb 2020 for bacteremia and abscess  - history of poor glycemic control for years  - s/p IVA OD #1 (06.20.20), #2 (07.01.20), #3 (09.11.20), #4 (10.09.20), #5 (11.06.20), #6 (11.24.21), #7 (12.22.21), #8 (01.19.22)  - s/p IVA OS #1 (06.20.20), #2 (07.01.20), #3 (08.13.20), #4 (09.11.20)  #5 (11.06.20), #6 (11.24.21), #7 (12.22.21), #8 (01.19.22)  =========================================== - s/p IVE OU #1 (12.04.20) - sample; #2 (01.06.21), #3 (02.18.22), #4 (03.18.22), #5 (04.18.22), #6 (05.25.22), #7 (07.01.22), #8 (07.29.22), #9 (09.02.22), #10 (09.30.22), #11 (11.04.22), #12 (12.09.23), #13 (01.13.23), #14 (03.02.23), #15 (04.11.23), #16 (06.01.23), #17 (07.11.23), #18 (09.12.23), #19 (11.14.23) - s/p IVE OD #20 (03.12.24), #21 (04.23.24), #22 (06.04.24), #23 (07.16.24), #24 (08.30.24), #25 (10.15.24), #26 (11.27.24), #27 (02.11.25), #28 (03.18.25), #29 (04.29.25) ============================================== - s/p IVV OS #1 (03.12.24), #2 (04.23.24), #3 (06.04.24), #4 (07.16.24), #5 (08.30.24), #6 (10.15.24), #7  (11.27.24), #8 (02.11.25), #9 (03.18.25), #10 (04.29.25) ==================================================  - s/p STK OS #1 (10.09.20)  - s/p PRP OS (06.10.20)  - s/p PPV/PFC/EL/FAX/silicone oil OD, 07.16.20  - s/p subconj silicon oil removal OD 8.20.20  - s/p PPV/EL/FAX/silicone oil OS, 08.20.20  - BCVA 20/150 OU -- stable             - OCT shows OD: interval improvement in central cystic changes; OS: interval improvement in central cystic changes at 5 weeks  - recommend IVE OD #29 and IVV OS #10 today, 04.29.25 with f/u in 5-6 weeks  - RBA of procedure discussed, questions answered  - IVV informed consent obtained and signed, 01.02.25 (OU)  - IVE informed consent obtained and singed, 01.02.25  - see procedure note             - Eylea  benefits investigation begun 01.19.22 -- approved for 2025  - cont topical PF and Prolensa  QID OU for possible CME component  - f/u 5-6 weeks -- DFE/OCT, possible injection(s)  6,7. Hypertensive retinopathy OU  - discussed importance of tight BP control  - continue to monitor  8. Pseudophakia OU  - s/p CE with IOL Gennie Kicks)  Ophthalmic Meds Ordered this visit:  Meds ordered this encounter  Medications   prednisoLONE  acetate (PRED FORTE ) 1 % ophthalmic suspension    Sig: PLACE (1) DROP INTO BOTH EYES FOUR TIMES DAILY    Dispense:  15 mL    Refill:  6   ketorolac  (ACULAR ) 0.5 % ophthalmic solution    Sig: Place 1 drop into both eyes 4 (four) times daily.    Dispense:  5 mL    Refill:  6   faricimab -svoa (VABYSMO ) 6mg /0.51mL intravitreal injection   aflibercept  (EYLEA ) SOLN 2 mg     Return for 5-6 wks - PDR w/ DME OU - DFE, OCT, Possible Injxn.  There are no Patient Instructions on file for this visit.  This document serves as a record of services personally performed by Jeanice Millard, MD, PhD. It was created on their behalf by Olene Berne, COT an ophthalmic technician. The creation of this record is the provider's dictation and/or  activities during the visit.    Electronically signed by:  Olene Berne, COT  01/16/24 10:04 AM   This document serves as a record of services personally performed by Jeanice Millard, MD, PhD. It was created on their behalf by Diona Franklin, COMT. The creation of this record is the provider's dictation and/or activities during the visit.  Electronically signed by: Diona Franklin, COMT 01/16/24 10:04 AM   Jeanice Millard, M.D., Ph.D. Diseases & Surgery of the Retina and Vitreous [facility]  I have reviewed the above documentation for accuracy and completeness, and I agree with the above. Jeanice Millard, M.D., Ph.D. 01/16/24 10:07 AM   Abbreviations: M myopia (nearsighted); A astigmatism; H hyperopia (farsighted); P presbyopia; Mrx spectacle prescription;  CTL contact lenses; OD right eye; OS left eye; OU both eyes  XT exotropia; ET esotropia; PEK punctate epithelial keratitis; PEE punctate epithelial erosions; DES dry eye syndrome; MGD meibomian gland dysfunction; ATs artificial tears; PFAT's preservative free artificial tears; NSC nuclear sclerotic cataract; PSC posterior subcapsular cataract; ERM epi-retinal membrane; PVD posterior vitreous detachment; RD retinal detachment; DM diabetes mellitus; DR diabetic retinopathy; NPDR non-proliferative diabetic retinopathy; PDR proliferative diabetic retinopathy; CSME clinically significant macular edema; DME diabetic macular edema; dbh dot blot hemorrhages; CWS cotton wool spot; POAG primary open angle glaucoma; C/D cup-to-disc ratio; HVF humphrey visual field; GVF goldmann visual field; OCT optical coherence tomography; IOP intraocular pressure; BRVO Branch retinal vein occlusion; CRVO central retinal vein occlusion; CRAO central retinal artery occlusion; BRAO branch retinal artery occlusion; RT retinal tear; SB scleral buckle; PPV pars plana vitrectomy; VH Vitreous hemorrhage; PRP panretinal laser photocoagulation; IVK  intravitreal kenalog ; VMT vitreomacular traction; MH Macular hole;  NVD neovascularization of the disc; NVE neovascularization elsewhere; AREDS age related eye disease study; ARMD age related macular degeneration; POAG primary open angle glaucoma; EBMD epithelial/anterior basement membrane dystrophy; ACIOL anterior chamber intraocular lens; IOL intraocular lens; PCIOL posterior chamber intraocular lens; Phaco/IOL phacoemulsification with intraocular lens placement; PRK photorefractive keratectomy; LASIK laser assisted in situ keratomileusis; HTN hypertension; DM diabetes mellitus; COPD chronic obstructive pulmonary disease

## 2024-01-12 ENCOUNTER — Encounter (INDEPENDENT_AMBULATORY_CARE_PROVIDER_SITE_OTHER): Payer: Self-pay | Admitting: Ophthalmology

## 2024-01-12 ENCOUNTER — Ambulatory Visit (INDEPENDENT_AMBULATORY_CARE_PROVIDER_SITE_OTHER): Admitting: Ophthalmology

## 2024-01-12 DIAGNOSIS — Z7984 Long term (current) use of oral hypoglycemic drugs: Secondary | ICD-10-CM | POA: Diagnosis not present

## 2024-01-12 DIAGNOSIS — H3343 Traction detachment of retina, bilateral: Secondary | ICD-10-CM | POA: Diagnosis not present

## 2024-01-12 DIAGNOSIS — Z7985 Long-term (current) use of injectable non-insulin antidiabetic drugs: Secondary | ICD-10-CM

## 2024-01-12 DIAGNOSIS — Z961 Presence of intraocular lens: Secondary | ICD-10-CM

## 2024-01-12 DIAGNOSIS — H35033 Hypertensive retinopathy, bilateral: Secondary | ICD-10-CM

## 2024-01-12 DIAGNOSIS — E113513 Type 2 diabetes mellitus with proliferative diabetic retinopathy with macular edema, bilateral: Secondary | ICD-10-CM

## 2024-01-12 DIAGNOSIS — I10 Essential (primary) hypertension: Secondary | ICD-10-CM | POA: Diagnosis not present

## 2024-01-12 DIAGNOSIS — H4313 Vitreous hemorrhage, bilateral: Secondary | ICD-10-CM

## 2024-01-12 MED ORDER — FARICIMAB-SVOA 6 MG/0.05ML IZ SOSY
6.0000 mg | PREFILLED_SYRINGE | INTRAVITREAL | Status: AC | PRN
Start: 2024-01-12 — End: 2024-01-12
  Administered 2024-01-12: 6 mg via INTRAVITREAL

## 2024-01-12 MED ORDER — KETOROLAC TROMETHAMINE 0.5 % OP SOLN: 1.0000 [drp] | Freq: Four times a day (QID) | OPHTHALMIC | 6 refills | Status: AC

## 2024-01-12 MED ORDER — PREDNISOLONE ACETATE 1 % OP SUSP: OPHTHALMIC | 6 refills | Status: AC

## 2024-01-12 MED ORDER — AFLIBERCEPT 2MG/0.05ML IZ SOLN FOR KALEIDOSCOPE
2.0000 mg | INTRAVITREAL | Status: AC | PRN
Start: 2024-01-12 — End: 2024-01-12
  Administered 2024-01-12: 2 mg via INTRAVITREAL

## 2024-02-15 NOTE — Progress Notes (Signed)
 [facility] - Clinic Note  02/16/2024    CHIEF COMPLAINT Patient presents for Retina Follow Up  HISTORY OF PRESENT ILLNESS: Randy Oneal is a 58 y.o. male who presents to the clinic today for:  HPI     Retina Follow Up   Patient presents with  Diabetic Retinopathy.  In both eyes.  This started 5 years ago.  Duration of 5 weeks.  Since onset it is stable.  I, the attending physician,  performed the HPI with the patient and updated documentation appropriately.        Comments    Patient states no changes in vision. Pt denies FOL/floaters/pain.      Last edited by [name], MD on 02/16/2024  8:25 PM.    Patient states vision the same OU  Referring physician: [name], MD [address] [place],  [place] [address]  HISTORICAL INFORMATION:   Selected notes from the MEDICAL RECORD NUMBER Referred by Dr.Mark Roz for concern of decreased vision post cataract sx   CURRENT MEDICATIONS: Current Outpatient Medications (Ophthalmic Drugs)  Medication Sig   Bromfenac  Sodium (PROLENSA ) 0.07 % SOLN Place 1 drop into both eyes 4 (four) times daily.   Bromfenac  Sodium 0.07 % SOLN Place 1 drop into both eyes 4 (four) times daily. (Patient not taking: Reported on 10/27/2023)   Bromfenac  Sodium 0.07 % SOLN Place 1 drop into both eyes 4 (four) times daily. (Patient not taking: Reported on 10/27/2023)   ketorolac  (ACULAR ) 0.5 % ophthalmic solution Place 1 drop into both eyes 4 (four) times daily.   prednisoLONE  acetate (PRED FORTE ) 1 % ophthalmic suspension PLACE (1) DROP INTO BOTH EYES FOUR TIMES DAILY   No current facility-administered medications for this visit. (Ophthalmic Drugs)   Current Outpatient Medications (Other)  Medication Sig   acetaminophen  (TYLENOL ) 500 MG tablet Take 1,000 mg by mouth every 6 (six) hours as needed for moderate pain or headache.   amoxicillin -clavulanate (AUGMENTIN ) 500-125 MG tablet Take 1 tablet by mouth 2 (two)  times daily. (Patient not taking: Reported on 07/21/2023)   cinacalcet  (SENSIPAR ) 60 MG tablet Take 60 mg by mouth every Monday, Wednesday, and Friday.   doxycycline  (VIBRA -TABS) 100 MG tablet Take 1 tablet (100 mg total) by mouth every 12 (twelve) hours. (Patient not taking: Reported on 07/21/2023)   insulin  aspart (NOVOLOG  FLEXPEN) 100 UNIT/ML FlexPen 7 units with breakfast and lunch, and 8 units with supper CF NovoLog (BG -130/40)  Max daily 45 units (Patient taking differently: Inject 9-10 Units into the skin 3 (three) times daily with meals.)   insulin  degludec (TRESIBA  FLEXTOUCH) 100 UNIT/ML FlexTouch Pen Inject 24 Units into the skin at bedtime.   Insulin  Disposable Pump (OMNIPOD 5 G7 INTRO, GEN 5,) KIT 1 Device by Does not apply route every 3 (three) days.   Insulin  Disposable Pump (OMNIPOD 5 G7 PODS, GEN 5,) MISC 1 Device by Does not apply route every 3 (three) days.   lidocaine  (XYLOCAINE ) 2 % jelly Apply topically. (Patient not taking: Reported on 10/27/2023)   sucroferric oxyhydroxide (VELPHORO ) 500 MG chewable tablet Chew 1,000 mg by mouth 3 (three) times daily with meals.   Wound Cleansers (VASHE CLEANSING) SOLN Apply topically.   No current facility-administered medications for this visit. (Other)   REVIEW OF SYSTEMS: ROS   Positive for: Genitourinary, Endocrine, Eyes Negative for: Constitutional, Gastrointestinal, Neurological, Skin, Musculoskeletal, HENT, Cardiovascular, Respiratory, Psychiatric, Allergic/Imm, Heme/Lymph Last edited by Elnor Avelina RAMAN, COT on 02/16/2024  1:50  PM.     ALLERGIES Allergies  Allergen Reactions   Tramadol  Nausea And Vomiting   PAST MEDICAL HISTORY Past Medical History:  Diagnosis Date   Anemia    Cataract 07/02/2020   Diabetes mellitus    Type 2    Diabetic retinopathy of both eyes (HCC) 01/31/2019   Dr Valdemar 01/2019   ESRD (end stage renal disease) (HCC)    [place] Frenisus   Herpes simplex virus (HSV) infection    OU   Hypertension     Hypertensive retinopathy    OU   Retinal detachment    OS   Sepsis due to Streptococcus, group B (HCC) 10/05/2018   Past Surgical History:  Procedure Laterality Date   25 GAUGE PARS PLANA VITRECTOMY WITH 20 GAUGE MVR PORT FOR MACULAR HOLE Right 02/24/2019   Procedure: 25 GAUGE PARS PLANA VITRECTOMY WITH 20 GAUGE MVR PORT FOR MACULAR HOLE;  Surgeon: [name], MD;  Location: [facility] OR;  Service: Ophthalmology;  Laterality: Right;   APPLICATION OF WOUND VAC Left 08/27/2018   Procedure: APPLICATION OF WOUND VAC, left neck;  Surgeon: Lucas Dorise POUR, MD;  Location: [facility] OR;  Service: Thoracic;  Laterality: Left;   APPLICATION OF WOUND VAC Left 08/30/2018   Procedure: APPLICATION OF WOUND VAC;  Surgeon: Lucas Dorise POUR, MD;  Location: [facility] OR;  Service: Thoracic;  Laterality: Left;   AV FISTULA PLACEMENT Left 02/15/2020   Procedure: LEFT ARM ARTERIOVENOUS (AV) FISTULA CREATION;  Surgeon: Sheree Penne Bruckner, MD;  Location: [facility] OR;  Service: Vascular;  Laterality: Left;   CATARACT EXTRACTION Bilateral    CATARACT EXTRACTION W/ INTRAOCULAR LENS  IMPLANT, BILATERAL     COLONOSCOPY  06/24/2011   Procedure: COLONOSCOPY;  Surgeon: Margo CHRISTELLA Haddock, MD;  Location: [facility];  Service: Endoscopy;  Laterality: N/A;  8:30 AM   EYE SURGERY     GAS/FLUID EXCHANGE Left 03/31/2019   Procedure: Gas/Fluid Exchange;  Surgeon: [name], MD;  Location: [facility] OR;  Service: Ophthalmology;  Laterality: Left;   INJECTION OF SILICONE OIL  02/24/2019   Procedure: Injection Of Silicone Oil;  Surgeon: [name], MD;  Location: [facility] OR;  Service: Ophthalmology;;   INSERTION OF DIALYSIS CATHETER Right 12/05/2019   Procedure: INSERTION OF DIALYSIS CATHETER RIGHT SUBCLAVIAN;  Surgeon: Kallie Manuelita BROCKS, MD;  Location: [facility] ORS;  Service: General;  Laterality: Right;   INSERTION OF DIALYSIS CATHETER Right 12/07/2019   Procedure: Minor Dialysis catheter in place- need to reposition/ adjust catheter to help with flows;   Surgeon: Kallie Manuelita BROCKS, MD;  Location: [facility] ORS;  Service: General;  Laterality: Right;  procedure room case with 1% lidocaine , full sterile drape,  towels, full gown, large chloraprep, 4-0 Monocryl and dermabond    INSERTION OF DIALYSIS CATHETER Right 12/08/2019   Procedure: INSERTION OF DIALYSIS CATHETER EXCHANGE;  Surgeon: Kallie Manuelita BROCKS, MD;  Location: [facility] ORS;  Service: General;  Laterality: Right;   IR ANGIOGRAM EXTREMITY RIGHT  03/15/2021   IR RADIOLOGIST EVAL & MGMT  02/07/2021   IR RADIOLOGIST EVAL & MGMT  05/09/2021   IR US  GUIDE VASC ACCESS RIGHT  03/15/2021   MEMBRANE PEEL Right 02/24/2019   Procedure: Ottie Booty;  Surgeon: [name], MD;  Location: [facility] OR;  Service: Ophthalmology;  Laterality: Right;   PARS PLANA VITRECTOMY Left 03/31/2019   Procedure: PARS PLANA VITRECTOMY WITH 25 GAUGE WITH MEMBRANE PEEL;  Surgeon: [name], MD;  Location: [facility] OR;  Service: Ophthalmology;  Laterality: Left;   PHOTOCOAGULATION  WITH LASER Right 02/24/2019   Procedure: Photocoagulation With Laser;  Surgeon: [name], MD;  Location: [facility] OR;  Service: Ophthalmology;  Laterality: Right;   PHOTOCOAGULATION WITH LASER Left 03/31/2019   Procedure: Photocoagulation With Laser;  Surgeon: [name], MD;  Location: [facility] OR;  Service: Ophthalmology;  Laterality: Left;   RETINAL DETACHMENT SURGERY Left 03/31/2019   TRD Repair - Dr. Rogue Valdemar   SILICON OIL REMOVAL Right 03/31/2019   Procedure: Silicon Oil Removal;  Surgeon: [name], MD;  Location: [facility] OR;  Service: Ophthalmology;  Laterality: Right;   STERNAL WOUND DEBRIDEMENT Left 08/27/2018   Procedure: Incision and DEBRIDEMENT Left Chest, Neck and Mediastinum;  Surgeon: Lucas Dorise POUR, MD;  Location: [facility] OR;  Service: Thoracic;  Laterality: Left;   STERNAL WOUND DEBRIDEMENT Left 08/30/2018   Procedure: WOUND VAC CHANGE, LEFT CHEST AND NECK, POSSIBLE DEBRIDEMENT;  Surgeon: Lucas Dorise POUR, MD;  Location: [facility] OR;  Service: Thoracic;  Laterality:  Left;   FAMILY HISTORY Family History  Problem Relation Age of Onset   Hypertension Mother    Colon cancer Neg Hx    SOCIAL HISTORY Social History   Tobacco Use   Smoking status: Never   Smokeless tobacco: Never  Vaping Use   Vaping status: Never Used  Substance Use Topics   Alcohol use: No   Drug use: No       OPHTHALMIC EXAM:  Base Eye Exam     Visual Acuity (Snellen - Linear)       Right Left   Dist [place] 20/200 -2 20/200   Dist ph [place] 20/150 +1 20/150 -2         Tonometry (Tonopen, 1:59 PM)       Right Left   Pressure 17 19         Pupils       Pupils Dark Light Shape React APD   Right PERRL 3 3 Irregular Minimal None   Left PERRL 3 3 Irregular Minimal None         Visual Fields       Left Right    Full Full         Extraocular Movement       Right Left    Full, Ortho Full, Ortho         Neuro/Psych     Oriented x3: Yes   Mood/Affect: Normal         Dilation     Both eyes: 1.0% Mydriacyl , 2.5% Phenylephrine  @ 2:00 PM           Slit Lamp and Fundus Exam     Slit Lamp Exam       Right Left   Lids/Lashes mild Meibomian gland dysfunction mild Meibomian gland dysfunction   Conjunctiva/Sclera subconj silicon oil bubbles, Chemosis, conj Cyst White and quiet   Cornea Trace PEE, Well healed cataract wound, arcus, Debris in tear film Trace PEE, Well healed cataract wound, arcus, Debris in tear film   Anterior Chamber Deep and quiet, no cell or flare Deep and quiet, no cell or flare   Iris slightly irregular dilation, posterior synchiae from 3:00-1200 round, focal Posterior synechiae at 1030   Lens PC IOL in good position, pigment deposition, 1+ PCO, mild pigment deposition on optic PC IOL in good position, mild pigment deposition   Anterior Vitreous Post vit, silicon oil bubble ~85% post vitrectomy, good silicone oil fill         Fundus Exam  Right Left   Disc 2+pallor, sharp rim, mild fibrosis along SN rim +pallor,  sharp rim, fine NVD -- regressed, mild fibrosis   C/D Ratio 0.2 0.2   Macula attached under oil, scattered MA/IRH -- improved, nasal and central thickening / edema -- persistent attached under oil, persistent fibrosis, persistent edema, focal bands of PRF with traction emanating from ST macula -- stable, focal DBH temporal macula -- stably improved   Vessels Severe attenuation, Tortuous Severe attenuation, Tortuous   Periphery Attached, dense 360 PRP laser, mild scattered fibrosis - persistent, Focal ret hole along distal IT arcades -- good laser surrounding Attached, good 360 PRP laser changes, pre-retinal fibrosis extending to posterior PRP border superior and inferiorly           IMAGING AND PROCEDURES  Imaging and Procedures for @TODAY @  OCT, Retina - OU - Both Eyes       Right Eye Quality was good. Central Foveal Thickness: 621. Progression has been stable. Findings include no SRF, abnormal foveal contour, epiretinal membrane, intraretinal fluid, outer retinal atrophy, preretinal fibrosis (Persistent central cystic changes ).   Left Eye Quality was good. Central Foveal Thickness: 533. Progression has worsened. Findings include no SRF, abnormal foveal contour, intraretinal hyper-reflective material, epiretinal membrane, intraretinal fluid, preretinal fibrosis (Persistent central cystic changes -- slightly increased).   Notes *Images captured and stored on drive  Diagnosis / Impression:  +DME under silicon oil OU OD: persistent central cystic changes  OS: persistent central cystic changes -- slightly increased  Clinical management:  See below  Abbreviations: NFP - Normal foveal profile. CME - cystoid macular edema. PED - pigment epithelial detachment. IRF - intraretinal fluid. SRF - subretinal fluid. EZ - ellipsoid zone. ERM - epiretinal membrane. ORA - outer retinal atrophy. ORT - outer retinal tubulation. SRHM - subretinal hyper-reflective material      Intravitreal  Injection, Pharmacologic Agent - OD - Right Eye       Time Out 02/16/2024. 2:31 PM. Confirmed correct patient, procedure, site, and patient consented.   Anesthesia Topical anesthesia was used. Anesthetic medications included Lidocaine  2%, Proparacaine  0.5%.   Procedure Preparation included 5% betadine to ocular surface, eyelid speculum. A (32g) needle was used.   Injection: 2 mg aflibercept  2 MG/0.05ML   Route: Intravitreal, Site: Right Eye   NDC: Q956576, Lot: [id number], Expiration date: 05/10/2025, Waste: 0 mL   Post-op Post injection exam found visual acuity of at least counting fingers. The patient tolerated the procedure well. There were no complications. The patient received written and verbal post procedure care education. Post injection medications were not given.      Intravitreal Injection, Pharmacologic Agent - OS - Left Eye       Time Out 02/16/2024. 2:31 PM. Confirmed correct patient, procedure, site, and patient consented.   Anesthesia Topical anesthesia was used. Anesthetic medications included Lidocaine  2%, Proparacaine  0.5%.   Procedure Preparation included 5% betadine to ocular surface, eyelid speculum. A supplied (32g) needle was used.   Injection: 6 mg faricimab -svoa 6 MG/0.05ML   Route: Intravitreal, Site: Left Eye   NDC: 49757-903-93, Lot: [id number], Expiration date: 12/08/2024, Waste: 0 mL   Post-op Post injection exam found visual acuity of at least counting fingers. The patient tolerated the procedure well. There were no complications. The patient received written and verbal post procedure care education. Post injection medications were not given.            ASSESSMENT/PLAN:    ICD-10-CM   1. Proliferative diabetic retinopathy of  both eyes with macular edema associated with type 2 diabetes mellitus (HCC)  E11.3513 OCT, Retina - OU - Both Eyes    Intravitreal Injection, Pharmacologic Agent - OD - Right Eye    Intravitreal Injection,  Pharmacologic Agent - OS - Left Eye    faricimab -svoa (VABYSMO ) 6mg /0.56mL intravitreal injection    aflibercept  (EYLEA ) SOLN 2 mg    2. Long term (current) use of oral hypoglycemic drugs  Z79.84     3. Long-term (current) use of injectable non-insulin  antidiabetic drugs  Z79.85     4. 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 Vitreous hemorrhage of both eyes (HCC)  H43.13     6. Essential hypertension  I10     7. Hypertensive retinopathy of both eyes  H35.033     8. Pseudophakia of both eyes  Z96.1      1-5. Proliferative diabetic retinopathy with DME, TRD, and vitreous hemorrhage OU (OD > OS)  - last A1c: 8.3 on 12.10.24  - lost to f/u from Jan 2021 to Oct 2021 due to loss of insurance coverage  - pt with complex medical history with hospitalization in Jan-Feb 2020 for bacteremia and abscess  - history of poor glycemic control for years  - s/p IVA OD #1 (06.20.20), #2 (07.01.20), #3 (09.11.20), #4 (10.09.20), #5 (11.06.20), #6 (11.24.21), #7 (12.22.21), #8 (01.19.22)  - s/p IVA OS #1 (06.20.20), #2 (07.01.20), #3 (08.13.20), #4 (09.11.20)  #5 (11.06.20), #6 (11.24.21), #7 (12.22.21), #8 (01.19.22)  =========================================== - s/p IVE OU #1 (12.04.20) - sample; #2 (01.06.21), #3 (02.18.22), #4 (03.18.22), #5 (04.18.22), #6 (05.25.22), #7 (07.01.22), #8 (07.29.22), #9 (09.02.22), #10 (09.30.22), #11 (11.04.22), #12 (12.09.23), #13 (01.13.23), #14 (03.02.23), #15 (04.11.23), #16 (06.01.23), #17 (07.11.23), #18 (09.12.23), #19 (11.14.23) - s/p IVE OD #20 (03.12.24), #21 (04.23.24), #22 (06.04.24), #23 (07.16.24), #24 (08.30.24), #25 (10.15.24), #26 (11.27.24), #27 (02.11.25), #28 (03.18.25), #29 (04.29.25) ======================= - s/p IVV OS #1 (03.12.24), #2 (04.23.24), #3 (06.04.24), #4 (07.16.24), #5 (08.30.24), #6 (10.15.24), #7 (11.27.24), #8 (02.11.25), #9 (03.18.25), #10 (04.29.25) ========================  - s/p STK OS #1 (10.09.20)  - s/p PRP  OS (06.10.20)  - s/p PPV/PFC/EL/FAX/silicone oil OD, 07.16.20  - s/p subconj silicon oil removal OD 8.20.20  - s/p PPV/EL/FAX/silicone oil OS, 08.20.20  - BCVA 20/150 OU -- stable             - OCT shows OD: persistent central cystic changes, OS: persistent central cystic changes -- slightly increased at 5 weeks  - recommend IVE OD #30 and IVV OS #11 today, 04.29.25 with f/u in 5 weeks again  - RBA of procedure discussed, questions answered  - IVV informed consent obtained and signed, 01.02.25 (OU)  - IVE informed consent obtained and singed, 01.02.25  - see procedure note             - Eylea  benefits investigation begun 01.19.22 -- approved for 2025  - cont topical PF and Prolensa  QID OU for possible CME component  - f/u 5 weeks -- DFE/OCT, possible injection(s)  6,7. Hypertensive retinopathy OU  - discussed importance of tight BP control  - continue to monitor  8. Pseudophakia OU  - s/p CE with IOL [place])  Ophthalmic Meds Ordered this visit:  Meds ordered this encounter  Medications   faricimab -svoa (VABYSMO ) 6mg /0.55mL intravitreal injection   aflibercept  (EYLEA ) SOLN 2 mg     Return in about 5 weeks (around 03/22/2024) for f/u PDR OU, DFE, OCT, Possible Injxn.  There  are no Patient Instructions on file for this visit.  This document serves as a record of services personally performed by Redell JUDITHANN Hans, MD, PhD. It was created on their behalf by Auston Muzzy, COMT. The creation of this record is the provider's dictation and/or activities during the visit.  Electronically signed by: Auston Muzzy, COMT 02/16/24 8:27 PM  This document serves as a record of services personally performed by Redell JUDITHANN Hans, MD, PhD. It was created on their behalf by Alan PARAS. Delores, OA an ophthalmic technician. The creation of this record is the provider's dictation and/or activities during the visit.    Electronically signed by: Alan PARAS. Delores, OA 02/16/24 8:27 PM  Redell JUDITHANN Hans, M.D.,  Ph.D. Diseases & Surgery of the Retina and Vitreous [facility]  I have reviewed the above documentation for accuracy and completeness, and I agree with the above. Redell JUDITHANN Hans, M.D., Ph.D. 02/16/24 8:28 PM   Abbreviations: M myopia (nearsighted); A astigmatism; H hyperopia (farsighted); P presbyopia; Mrx spectacle prescription;  CTL contact lenses; OD right eye; OS left eye; OU both eyes  XT exotropia; ET esotropia; PEK punctate epithelial keratitis; PEE punctate epithelial erosions; DES dry eye syndrome; MGD meibomian gland dysfunction; ATs artificial tears; PFAT's preservative free artificial tears; NSC nuclear sclerotic cataract; PSC posterior subcapsular cataract; ERM epi-retinal membrane; PVD posterior vitreous detachment; RD retinal detachment; DM diabetes mellitus; DR diabetic retinopathy; NPDR non-proliferative diabetic retinopathy; PDR proliferative diabetic retinopathy; CSME clinically significant macular edema; DME diabetic macular edema; dbh dot blot hemorrhages; CWS cotton wool spot; POAG primary open angle glaucoma; C/D cup-to-disc ratio; HVF humphrey visual field; GVF goldmann visual field; OCT optical coherence tomography; IOP intraocular pressure; BRVO Branch retinal vein occlusion; CRVO central retinal vein occlusion; CRAO central retinal artery occlusion; BRAO branch retinal artery occlusion; RT retinal tear; SB scleral buckle; PPV pars plana vitrectomy; VH Vitreous hemorrhage; PRP panretinal laser photocoagulation; IVK intravitreal kenalog ; VMT vitreomacular traction; MH Macular hole;  NVD neovascularization of the disc; NVE neovascularization elsewhere; AREDS age related eye disease study; ARMD age related macular degeneration; POAG primary open angle glaucoma; EBMD epithelial/anterior basement membrane dystrophy; ACIOL anterior chamber intraocular lens; IOL intraocular lens; PCIOL posterior chamber intraocular lens; Phaco/IOL phacoemulsification with  intraocular lens placement; PRK photorefractive keratectomy; LASIK laser assisted in situ keratomileusis; HTN hypertension; DM diabetes mellitus; COPD chronic obstructive pulmonary disease

## 2024-02-16 ENCOUNTER — Encounter (INDEPENDENT_AMBULATORY_CARE_PROVIDER_SITE_OTHER): Payer: Self-pay | Admitting: Ophthalmology

## 2024-02-16 ENCOUNTER — Ambulatory Visit (INDEPENDENT_AMBULATORY_CARE_PROVIDER_SITE_OTHER): Admitting: Ophthalmology

## 2024-02-16 DIAGNOSIS — E113513 Type 2 diabetes mellitus with proliferative diabetic retinopathy with macular edema, bilateral: Secondary | ICD-10-CM

## 2024-02-16 DIAGNOSIS — Z7985 Long-term (current) use of injectable non-insulin antidiabetic drugs: Secondary | ICD-10-CM

## 2024-02-16 DIAGNOSIS — H35033 Hypertensive retinopathy, bilateral: Secondary | ICD-10-CM | POA: Diagnosis not present

## 2024-02-16 DIAGNOSIS — H4313 Vitreous hemorrhage, bilateral: Secondary | ICD-10-CM

## 2024-02-16 DIAGNOSIS — Z7984 Long term (current) use of oral hypoglycemic drugs: Secondary | ICD-10-CM | POA: Diagnosis not present

## 2024-02-16 DIAGNOSIS — H3343 Traction detachment of retina, bilateral: Secondary | ICD-10-CM

## 2024-02-16 DIAGNOSIS — Z961 Presence of intraocular lens: Secondary | ICD-10-CM

## 2024-02-16 DIAGNOSIS — I10 Essential (primary) hypertension: Secondary | ICD-10-CM

## 2024-02-16 MED ORDER — AFLIBERCEPT 2MG/0.05ML IZ SOLN FOR KALEIDOSCOPE
2.0000 mg | INTRAVITREAL | Status: AC | PRN
  Administered 2024-02-16: 2 mg via INTRAVITREAL

## 2024-02-16 MED ORDER — FARICIMAB-SVOA 6 MG/0.05ML IZ SOSY
6.0000 mg | PREFILLED_SYRINGE | INTRAVITREAL | Status: AC | PRN
  Administered 2024-02-16: 6 mg via INTRAVITREAL

## 2024-02-17 NOTE — Progress Notes (Signed)
   02/17/2024  Patient ID: Randy Oneal, male   DOB: [birth year], 58 y.o.   MRN: [id number]  Patient appearing on [place] DM report d/t A1c >8%. Patient is ESRD-HD closely followed by endocrine and nephrology who make the insulin  adjustments, no apparent adherence or accessibility issues noted. No action at this time.   Lab Results  Component Value Date   HGBA1C 8.3 (A) 11/24/2023   HGBA1C 8.3 (A) 07/21/2023   HGBA1C 8.0 (A) 01/13/2023   HGBA1C >15.0 08/27/2022   HGBA1C 8.5 (A) 10/10/2020   HGBA1C 11.0 (A) 07/11/2020   HGBA1C 8.8 (H) 11/30/2019   HGBA1C 7.7 (H) 02/15/2019   HGBA1C 13.0 (A) 08/16/2018   HGBA1C >15.5 (H) 02/04/2018   HGBA1C >15.5 (H) 07/09/2017   HGBA1C 14.8 (H) 10/30/2016   HGBA1C 16.8 (H) 11/05/2015   Lang Sieve, PharmD, BCGP Clinical Pharmacist  [phone]

## 2024-03-08 NOTE — Progress Notes (Signed)
 [facility] - Clinic Note  03/22/2024    CHIEF COMPLAINT Patient presents for Retina Follow Up  HISTORY OF PRESENT ILLNESS: Randy Oneal is a 58 y.o. male who presents to the clinic today for:  HPI     Retina Follow Up   Patient presents with  Diabetic Retinopathy.  In both eyes.  This started 5 weeks ago.  I, the attending physician,  performed the HPI with the patient and updated documentation appropriately.        Comments   Patient here for 5 weeks retina follow up for PDR OU. Patient states vision doing good. No eye pain. Using drops.      Last edited by [name], MD on 03/22/2024  5:32 PM.     Patient states vision the same OU  Referring physician: [name], MD [address] [place],  [place] [address]  HISTORICAL INFORMATION:   Selected notes from the MEDICAL RECORD NUMBER Referred by Dr.Mark Roz for concern of decreased vision post cataract sx   CURRENT MEDICATIONS: Current Outpatient Medications (Ophthalmic Drugs)  Medication Sig   Bromfenac  Sodium (PROLENSA ) 0.07 % SOLN Place 1 drop into both eyes 4 (four) times daily.   ketorolac  (ACULAR ) 0.5 % ophthalmic solution Place 1 drop into both eyes 4 (four) times daily.   prednisoLONE  acetate (PRED FORTE ) 1 % ophthalmic suspension PLACE (1) DROP INTO BOTH EYES FOUR TIMES DAILY   Bromfenac  Sodium 0.07 % SOLN Place 1 drop into both eyes 4 (four) times daily. (Patient not taking: Reported on 03/22/2024)   Bromfenac  Sodium 0.07 % SOLN Place 1 drop into both eyes 4 (four) times daily. (Patient not taking: Reported on 03/22/2024)   No current facility-administered medications for this visit. (Ophthalmic Drugs)   Current Outpatient Medications (Other)  Medication Sig   acetaminophen  (TYLENOL ) 500 MG tablet Take 1,000 mg by mouth every 6 (six) hours as needed for moderate pain or headache.   cinacalcet  (SENSIPAR ) 60 MG tablet Take 60 mg by mouth every Monday, Wednesday, and  Friday.   insulin  aspart (NOVOLOG  FLEXPEN) 100 UNIT/ML FlexPen 7 units with breakfast and lunch, and 8 units with supper CF NovoLog (BG -130/40)  Max daily 45 units (Patient taking differently: Inject 9-10 Units into the skin 3 (three) times daily with meals.)   insulin  degludec (TRESIBA  FLEXTOUCH) 100 UNIT/ML FlexTouch Pen Inject 24 Units into the skin at bedtime.   Insulin  Disposable Pump (OMNIPOD 5 G7 INTRO, GEN 5,) KIT 1 Device by Does not apply route every 3 (three) days.   Insulin  Disposable Pump (OMNIPOD 5 G7 PODS, GEN 5,) MISC 1 Device by Does not apply route every 3 (three) days.   sucroferric oxyhydroxide (VELPHORO ) 500 MG chewable tablet Chew 1,000 mg by mouth 3 (three) times daily with meals.   Wound Cleansers (VASHE CLEANSING) SOLN Apply topically.   amoxicillin -clavulanate (AUGMENTIN ) 500-125 MG tablet Take 1 tablet by mouth 2 (two) times daily. (Patient not taking: Reported on 03/22/2024)   doxycycline  (VIBRA -TABS) 100 MG tablet Take 1 tablet (100 mg total) by mouth every 12 (twelve) hours. (Patient not taking: Reported on 03/22/2024)   lidocaine  (XYLOCAINE ) 2 % jelly Apply topically. (Patient not taking: Reported on 03/22/2024)   No current facility-administered medications for this visit. (Other)   REVIEW OF SYSTEMS: ROS   Positive for: Genitourinary, Endocrine, Eyes Negative for: Constitutional, Gastrointestinal, Neurological, Skin, Musculoskeletal, HENT, Cardiovascular, Respiratory, Psychiatric, Allergic/Imm, Heme/Lymph Last edited by Orval Asberry RAMAN, COA on 03/22/2024  1:52 PM.      ALLERGIES Allergies  Allergen Reactions   Tramadol  Nausea And Vomiting   PAST MEDICAL HISTORY Past Medical History:  Diagnosis Date   Anemia    Cataract 07/02/2020   Diabetes mellitus    Type 2    Diabetic retinopathy of both eyes (HCC) 01/31/2019   Dr Valdemar 01/2019   ESRD (end stage renal disease) (HCC)    [place] Frenisus   Herpes simplex virus (HSV) infection    OU    Hypertension    Hypertensive retinopathy    OU   Retinal detachment    OS   Sepsis due to Streptococcus, group B (HCC) 10/05/2018   Past Surgical History:  Procedure Laterality Date   25 GAUGE PARS PLANA VITRECTOMY WITH 20 GAUGE MVR PORT FOR MACULAR HOLE Right 02/24/2019   Procedure: 25 GAUGE PARS PLANA VITRECTOMY WITH 20 GAUGE MVR PORT FOR MACULAR HOLE;  Surgeon: [name], MD;  Location: [facility] OR;  Service: Ophthalmology;  Laterality: Right;   APPLICATION OF WOUND VAC Left 08/27/2018   Procedure: APPLICATION OF WOUND VAC, left neck;  Surgeon: Lucas Dorise POUR, MD;  Location: [facility] OR;  Service: Thoracic;  Laterality: Left;   APPLICATION OF WOUND VAC Left 08/30/2018   Procedure: APPLICATION OF WOUND VAC;  Surgeon: Lucas Dorise POUR, MD;  Location: [facility] OR;  Service: Thoracic;  Laterality: Left;   AV FISTULA PLACEMENT Left 02/15/2020   Procedure: LEFT ARM ARTERIOVENOUS (AV) FISTULA CREATION;  Surgeon: Sheree Penne Bruckner, MD;  Location: [facility] OR;  Service: Vascular;  Laterality: Left;   CATARACT EXTRACTION Bilateral    CATARACT EXTRACTION W/ INTRAOCULAR LENS  IMPLANT, BILATERAL     COLONOSCOPY  06/24/2011   Procedure: COLONOSCOPY;  Surgeon: Margo CHRISTELLA Haddock, MD;  Location: [facility] SUITE;  Service: Endoscopy;  Laterality: N/A;  8:30 AM   EYE SURGERY     GAS/FLUID EXCHANGE Left 03/31/2019   Procedure: Gas/Fluid Exchange;  Surgeon: [name], MD;  Location: [facility] OR;  Service: Ophthalmology;  Laterality: Left;   INJECTION OF SILICONE OIL  02/24/2019   Procedure: Injection Of Silicone Oil;  Surgeon: [name], MD;  Location: [facility] OR;  Service: Ophthalmology;;   INSERTION OF DIALYSIS CATHETER Right 12/05/2019   Procedure: INSERTION OF DIALYSIS CATHETER RIGHT SUBCLAVIAN;  Surgeon: Kallie Manuelita BROCKS, MD;  Location: [facility] ORS;  Service: General;  Laterality: Right;   INSERTION OF DIALYSIS CATHETER Right 12/07/2019   Procedure: Minor Dialysis catheter in place- need to reposition/ adjust catheter to help with  flows;  Surgeon: Kallie Manuelita BROCKS, MD;  Location: [facility] ORS;  Service: General;  Laterality: Right;  procedure room case with 1% lidocaine , full sterile drape,  towels, full gown, large chloraprep, 4-0 Monocryl and dermabond    INSERTION OF DIALYSIS CATHETER Right 12/08/2019   Procedure: INSERTION OF DIALYSIS CATHETER EXCHANGE;  Surgeon: Kallie Manuelita BROCKS, MD;  Location: [facility] ORS;  Service: General;  Laterality: Right;   IR ANGIOGRAM EXTREMITY RIGHT  03/15/2021   IR RADIOLOGIST EVAL & MGMT  02/07/2021   IR RADIOLOGIST EVAL & MGMT  05/09/2021   IR US  GUIDE VASC ACCESS RIGHT  03/15/2021   MEMBRANE PEEL Right 02/24/2019   Procedure: Ottie Booty;  Surgeon: [name], MD;  Location: [facility] OR;  Service: Ophthalmology;  Laterality: Right;   PARS PLANA VITRECTOMY Left 03/31/2019   Procedure: PARS PLANA VITRECTOMY WITH 25 GAUGE WITH MEMBRANE PEEL;  Surgeon: [name], MD;  Location: [facility] OR;  Service: Ophthalmology;  Laterality: Left;  PHOTOCOAGULATION WITH LASER Right 02/24/2019   Procedure: Photocoagulation With Laser;  Surgeon: [name], MD;  Location: [facility] OR;  Service: Ophthalmology;  Laterality: Right;   PHOTOCOAGULATION WITH LASER Left 03/31/2019   Procedure: Photocoagulation With Laser;  Surgeon: [name], MD;  Location: [facility] OR;  Service: Ophthalmology;  Laterality: Left;   RETINAL DETACHMENT SURGERY Left 03/31/2019   TRD Repair - Dr. Rogue Valdemar   SILICON OIL REMOVAL Right 03/31/2019   Procedure: Silicon Oil Removal;  Surgeon: [name], MD;  Location: [facility] OR;  Service: Ophthalmology;  Laterality: Right;   STERNAL WOUND DEBRIDEMENT Left 08/27/2018   Procedure: Incision and DEBRIDEMENT Left Chest, Neck and Mediastinum;  Surgeon: Lucas Dorise POUR, MD;  Location: [facility] OR;  Service: Thoracic;  Laterality: Left;   STERNAL WOUND DEBRIDEMENT Left 08/30/2018   Procedure: WOUND VAC CHANGE, LEFT CHEST AND NECK, POSSIBLE DEBRIDEMENT;  Surgeon: Lucas Dorise POUR, MD;  Location: [facility] OR;  Service: Thoracic;   Laterality: Left;   FAMILY HISTORY Family History  Problem Relation Age of Onset   Hypertension Mother    Colon cancer Neg Hx    SOCIAL HISTORY Social History   Tobacco Use   Smoking status: Never   Smokeless tobacco: Never  Vaping Use   Vaping status: Never Used  Substance Use Topics   Alcohol use: No   Drug use: No       OPHTHALMIC EXAM:  Base Eye Exam     Visual Acuity (Snellen - Linear)       Right Left   Dist [place] 20/200 20/200   Dist ph  20/150 +2 20/150 -1         Tonometry (Tonopen, 1:50 PM)       Right Left   Pressure 14 14         Pupils       Dark Light Shape React APD   Right 3 3 Irregular Minimal None   Left 3 3 Irregular Minimal None         Visual Fields (Counting fingers)       Left Right    Full Full         Extraocular Movement       Right Left    Full, Ortho Full, Ortho         Neuro/Psych     Oriented x3: Yes   Mood/Affect: Normal         Dilation     Both eyes: 1.0% Mydriacyl , 2.5% Phenylephrine  @ 1:50 PM           Slit Lamp and Fundus Exam     Slit Lamp Exam       Right Left   Lids/Lashes mild Meibomian gland dysfunction mild Meibomian gland dysfunction   Conjunctiva/Sclera subconj silicon oil bubbles, Chemosis, conj Cyst White and quiet   Cornea Trace PEE, Well healed cataract wound, arcus, Debris in tear film Trace PEE, Well healed cataract wound, arcus, Debris in tear film   Anterior Chamber Deep and quiet, no cell or flare Deep and quiet, no cell or flare   Iris slightly irregular dilation, posterior synchiae from 3:00-1200 round, focal Posterior synechiae at 1030   Lens PC IOL in good position, pigment deposition, 1+ PCO, mild pigment deposition on optic PC IOL in good position, mild pigment deposition   Anterior Vitreous Post vit, silicon oil bubble ~85% post vitrectomy, good silicone oil fill         Fundus Exam  Right Left   Disc 2+pallor, sharp rim, mild fibrosis along SN rim  +pallor, sharp rim, fine NVD -- regressed, mild fibrosis   C/D Ratio 0.2 0.2   Macula attached under oil, scattered MA/IRH -- improved, nasal and central thickening / edema -- persistent attached under oil, persistent fibrosis, persistent edema, focal bands of PRF with traction emanating from ST macula -- stable, focal DBH temporal macula -- stably improved   Vessels Severe attenuation, Tortuous Severe attenuation, Tortuous   Periphery Attached, dense 360 PRP laser, mild scattered fibrosis - persistent, Focal ret hole along distal IT arcades -- good laser surrounding Attached, good 360 PRP laser changes, pre-retinal fibrosis extending to posterior PRP border superior and inferiorly           IMAGING AND PROCEDURES  Imaging and Procedures for @TODAY @  OCT, Retina - OU - Both Eyes       Right Eye Quality was good. Central Foveal Thickness: 668. Progression has been stable. Findings include no SRF, abnormal foveal contour, epiretinal membrane, intraretinal fluid, outer retinal atrophy, preretinal fibrosis (Persistent central cystic changes ).   Left Eye Quality was good. Central Foveal Thickness: 601. Progression has worsened. Findings include no SRF, abnormal foveal contour, intraretinal hyper-reflective material, epiretinal membrane, intraretinal fluid, preretinal fibrosis (Persistent central cystic changes -- slightly increased).   Notes *Images captured and stored on drive  Diagnosis / Impression:  +DME under silicon oil OU OD: persistent central cystic changes  OS: persistent central cystic changes -- slightly increased  Clinical management:  See below  Abbreviations: NFP - Normal foveal profile. CME - cystoid macular edema. PED - pigment epithelial detachment. IRF - intraretinal fluid. SRF - subretinal fluid. EZ - ellipsoid zone. ERM - epiretinal membrane. ORA - outer retinal atrophy. ORT - outer retinal tubulation. SRHM - subretinal hyper-reflective material       Intravitreal Injection, Pharmacologic Agent - OD - Right Eye       Time Out 03/22/2024. 2:13 PM. Confirmed correct patient, procedure, site, and patient consented.   Anesthesia Topical anesthesia was used. Anesthetic medications included Lidocaine  2%, Proparacaine  0.5%.   Procedure Preparation included 5% betadine to ocular surface, eyelid speculum. A (32g) needle was used.   Injection: 2 mg aflibercept  2 MG/0.05ML   Route: Intravitreal, Site: Right Eye   NDC: D2246706, Lot: [id number], Expiration date: 06/09/2025, Waste: 0 mL   Post-op Post injection exam found visual acuity of at least counting fingers. The patient tolerated the procedure well. There were no complications. The patient received written and verbal post procedure care education. Post injection medications were not given.      Intravitreal Injection, Pharmacologic Agent - OS - Left Eye       Time Out 03/22/2024. 2:13 PM. Confirmed correct patient, procedure, site, and patient consented.   Anesthesia Topical anesthesia was used. Anesthetic medications included Lidocaine  2%, Proparacaine  0.5%.   Procedure Preparation included 5% betadine to ocular surface, eyelid speculum. A supplied (32g) needle was used.   Injection: 6 mg faricimab -svoa 6 MG/0.05ML   Route: Intravitreal, Site: Left Eye   NDC: 49757-903-93, Lot: [id number], Expiration date: 03/09/2025, Waste: 0 mL   Post-op Post injection exam found visual acuity of at least counting fingers. The patient tolerated the procedure well. There were no complications. The patient received written and verbal post procedure care education. Post injection medications were not given.             ASSESSMENT/PLAN:    ICD-10-CM   1. Proliferative diabetic retinopathy  of both eyes with macular edema associated with type 2 diabetes mellitus (HCC)  E11.3513 OCT, Retina - OU - Both Eyes    Intravitreal Injection, Pharmacologic Agent - OD - Right Eye     Intravitreal Injection, Pharmacologic Agent - OS - Left Eye    faricimab -svoa (VABYSMO ) 6mg /0.21mL intravitreal injection    aflibercept  (EYLEA ) SOLN 2 mg    2. Long term (current) use of oral hypoglycemic drugs  Z79.84     3. Long-term (current) use of injectable non-insulin  antidiabetic drugs  Z79.85     4. 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 Vitreous hemorrhage of both eyes (HCC)  H43.13     6. Essential hypertension  I10     7. Hypertensive retinopathy of both eyes  H35.033     8. Pseudophakia of both eyes  Z96.1       1-5. Proliferative diabetic retinopathy with DME, TRD, and vitreous hemorrhage OU (OD > OS)  - last A1c: 8.3 on 12.10.24  - lost to f/u from Jan 2021 to Oct 2021 due to loss of insurance coverage  - pt with complex medical history with hospitalization in Jan-Feb 2020 for bacteremia and abscess  - history of poor glycemic control for years  - s/p IVA OD #1 (06.20.20), #2 (07.01.20), #3 (09.11.20), #4 (10.09.20), #5 (11.06.20), #6 (11.24.21), #7 (12.22.21), #8 (01.19.22)  - s/p IVA OS #1 (06.20.20), #2 (07.01.20), #3 (08.13.20), #4 (09.11.20)  #5 (11.06.20), #6 (11.24.21), #7 (12.22.21), #8 (01.19.22)  =========================================== - s/p IVE OU #1 (12.04.20) - sample; #2 (01.06.21), #3 (02.18.22), #4 (03.18.22), #5 (04.18.22), #6 (05.25.22), #7 (07.01.22), #8 (07.29.22), #9 (09.02.22), #10 (09.30.22), #11 (11.04.22), #12 (12.09.23), #13 (01.13.23), #14 (03.02.23), #15 (04.11.23), #16 (06.01.23), #17 (07.11.23), #18 (09.12.23), #19 (11.14.23) - s/p IVE OD #20 (03.12.24), #21 (04.23.24), #22 (06.04.24), #23 (07.16.24), #24 (08.30.24), #25 (10.15.24), #26 (11.27.24), #27 (02.11.25), #28 (03.18.25), #29 (04.29.25), #30 (04.29.25)  ======================= - s/p IVV OS #1 (03.12.24), #2 (04.23.24), #3 (06.04.24), #4 (07.16.24), #5 (08.30.24), #6 (10.15.24), #7 (11.27.24), #8 (02.11.25), #9 (03.18.25), #10 (04.29.25), #11 (04.29.25)   ========================  - s/p STK OS #1 (10.09.20)  - s/p PRP OS (06.10.20)  - s/p PPV/PFC/EL/FAX/silicone oil OD, 07.16.20  - s/p subconj silicon oil removal OD 8.20.20  - s/p PPV/EL/FAX/silicone oil OS, 08.20.20  - BCVA 20/150 OU -- stable             - OCT shows OD: persistent central cystic changes, OS: persistent central cystic changes -- slightly increased at 5 weeks  - recommend IVE OD #31 and IVV OS #12 (04.29.25) today with f/u ext to 6 weeks  - RBA of procedure discussed, questions answered  - IVV informed consent obtained and signed, 01.02.25 (OU)  - IVE informed consent obtained and singed, 01.02.25  - see procedure note             - Eylea  benefits investigation begun 01.19.22 -- approved for 2025  - cont topical PF and Prolensa  QID OU for possible CME component  - f/u 6 weeks -- DFE/OCT, possible injection(s)  6,7. Hypertensive retinopathy OU  - discussed importance of tight BP control  - continue to monitor  8. Pseudophakia OU  - s/p CE with IOL [place])  Ophthalmic Meds Ordered this visit:  Meds ordered this encounter  Medications   faricimab -svoa (VABYSMO ) 6mg /0.62mL intravitreal injection   aflibercept  (EYLEA ) SOLN 2 mg     Return in about 6 weeks (around 05/03/2024) for PDR  OU, DFE, OCT, Possible Injxn.  There are no Patient Instructions on file for this visit.  This document serves as a record of services personally performed by Redell JUDITHANN Hans, MD, PhD. It was created on their behalf by Avelina Pereyra, COA an ophthalmic technician. The creation of this record is the provider's dictation and/or activities during the visit.   Electronically signed by: Avelina GORMAN Pereyra, COT  03/22/24  5:37 PM   Redell JUDITHANN Hans, M.D., Ph.D. Diseases & Surgery of the Retina and Vitreous [facility]  I have reviewed the above documentation for accuracy and completeness, and I agree with the above. Redell JUDITHANN Hans, M.D., Ph.D. 03/22/24 5:37 PM     Abbreviations: M myopia (nearsighted); A astigmatism; H hyperopia (farsighted); P presbyopia; Mrx spectacle prescription;  CTL contact lenses; OD right eye; OS left eye; OU both eyes  XT exotropia; ET esotropia; PEK punctate epithelial keratitis; PEE punctate epithelial erosions; DES dry eye syndrome; MGD meibomian gland dysfunction; ATs artificial tears; PFAT's preservative free artificial tears; NSC nuclear sclerotic cataract; PSC posterior subcapsular cataract; ERM epi-retinal membrane; PVD posterior vitreous detachment; RD retinal detachment; DM diabetes mellitus; DR diabetic retinopathy; NPDR non-proliferative diabetic retinopathy; PDR proliferative diabetic retinopathy; CSME clinically significant macular edema; DME diabetic macular edema; dbh dot blot hemorrhages; CWS cotton wool spot; POAG primary open angle glaucoma; C/D cup-to-disc ratio; HVF humphrey visual field; GVF goldmann visual field; OCT optical coherence tomography; IOP intraocular pressure; BRVO Branch retinal vein occlusion; CRVO central retinal vein occlusion; CRAO central retinal artery occlusion; BRAO branch retinal artery occlusion; RT retinal tear; SB scleral buckle; PPV pars plana vitrectomy; VH Vitreous hemorrhage; PRP panretinal laser photocoagulation; IVK intravitreal kenalog ; VMT vitreomacular traction; MH Macular hole;  NVD neovascularization of the disc; NVE neovascularization elsewhere; AREDS age related eye disease study; ARMD age related macular degeneration; POAG primary open angle glaucoma; EBMD epithelial/anterior basement membrane dystrophy; ACIOL anterior chamber intraocular lens; IOL intraocular lens; PCIOL posterior chamber intraocular lens; Phaco/IOL phacoemulsification with intraocular lens placement; PRK photorefractive keratectomy; LASIK laser assisted in situ keratomileusis; HTN hypertension; DM diabetes mellitus; COPD chronic obstructive pulmonary disease

## 2024-03-22 ENCOUNTER — Encounter (INDEPENDENT_AMBULATORY_CARE_PROVIDER_SITE_OTHER): Payer: Self-pay | Admitting: Ophthalmology

## 2024-03-22 ENCOUNTER — Ambulatory Visit (INDEPENDENT_AMBULATORY_CARE_PROVIDER_SITE_OTHER): Admitting: Ophthalmology

## 2024-03-22 DIAGNOSIS — Z7985 Long-term (current) use of injectable non-insulin antidiabetic drugs: Secondary | ICD-10-CM

## 2024-03-22 DIAGNOSIS — E113513 Type 2 diabetes mellitus with proliferative diabetic retinopathy with macular edema, bilateral: Secondary | ICD-10-CM | POA: Diagnosis not present

## 2024-03-22 DIAGNOSIS — H3343 Traction detachment of retina, bilateral: Secondary | ICD-10-CM

## 2024-03-22 DIAGNOSIS — H35033 Hypertensive retinopathy, bilateral: Secondary | ICD-10-CM | POA: Diagnosis not present

## 2024-03-22 DIAGNOSIS — Z961 Presence of intraocular lens: Secondary | ICD-10-CM

## 2024-03-22 DIAGNOSIS — Z7984 Long term (current) use of oral hypoglycemic drugs: Secondary | ICD-10-CM

## 2024-03-22 DIAGNOSIS — H4313 Vitreous hemorrhage, bilateral: Secondary | ICD-10-CM

## 2024-03-22 DIAGNOSIS — I10 Essential (primary) hypertension: Secondary | ICD-10-CM

## 2024-03-22 MED ORDER — FARICIMAB-SVOA 6 MG/0.05ML IZ SOSY
6.0000 mg | PREFILLED_SYRINGE | INTRAVITREAL | Status: AC | PRN
  Administered 2024-03-22 (×2): 6 mg via INTRAVITREAL

## 2024-03-22 MED ORDER — AFLIBERCEPT 2MG/0.05ML IZ SOLN FOR KALEIDOSCOPE
2.0000 mg | INTRAVITREAL | Status: AC | PRN
  Administered 2024-03-22 (×2): 2 mg via INTRAVITREAL

## 2024-04-12 NOTE — Progress Notes (Signed)
 [facility] - Clinic Note  04/26/2024    CHIEF COMPLAINT Patient presents for Retina Follow Up  HISTORY OF PRESENT ILLNESS: Randy Oneal is a 58 y.o. male who presents to the clinic today for:  HPI     Retina Follow Up   Patient presents with  Diabetic Retinopathy.  In both eyes.  This started 6 weeks ago.  Duration of 6 weeks.  Since onset it is stable.  I, the attending physician,  performed the HPI with the patient and updated documentation appropriately.        Comments   6 week retina follow up PDR OU and I'VE OD and IVV OS pt is reporting no vision changes noticed he denies any flashes or floaters last reading was 140 today       Last edited by [name], MD on 05/03/2024  2:56 PM.    Patient states he's doing well, came 1 wk early since Dr. JENEANE will be out part of next week.   Referring physician: [name], MD [address] [place],  [place] [address]  HISTORICAL INFORMATION:   Selected notes from the MEDICAL RECORD NUMBER Referred by Dr.Mark Roz for concern of decreased vision post cataract sx   CURRENT MEDICATIONS: Current Outpatient Medications (Ophthalmic Drugs)  Medication Sig   Bromfenac  Sodium (PROLENSA ) 0.07 % SOLN Place 1 drop into both eyes 4 (four) times daily.   Bromfenac  Sodium 0.07 % SOLN Place 1 drop into both eyes 4 (four) times daily.   Bromfenac  Sodium 0.07 % SOLN Place 1 drop into both eyes 4 (four) times daily.   ketorolac  (ACULAR ) 0.5 % ophthalmic solution Place 1 drop into both eyes 4 (four) times daily.   prednisoLONE  acetate (PRED FORTE ) 1 % ophthalmic suspension PLACE (1) DROP INTO BOTH EYES FOUR TIMES DAILY   No current facility-administered medications for this visit. (Ophthalmic Drugs)   Current Outpatient Medications (Other)  Medication Sig   acetaminophen  (TYLENOL ) 500 MG tablet Take 1,000 mg by mouth every 6 (six) hours as needed for moderate pain or headache.   amoxicillin -clavulanate  (AUGMENTIN ) 500-125 MG tablet Take 1 tablet by mouth 2 (two) times daily.   cinacalcet  (SENSIPAR ) 60 MG tablet Take 60 mg by mouth every Monday, Wednesday, and Friday.   doxycycline  (VIBRA -TABS) 100 MG tablet Take 1 tablet (100 mg total) by mouth every 12 (twelve) hours.   insulin  aspart (NOVOLOG  FLEXPEN) 100 UNIT/ML FlexPen 7 units with breakfast and lunch, and 8 units with supper CF NovoLog (BG -130/40)  Max daily 45 units (Patient taking differently: Inject 9-10 Units into the skin 3 (three) times daily with meals.)   insulin  degludec (TRESIBA  FLEXTOUCH) 100 UNIT/ML FlexTouch Pen Inject 24 Units into the skin at bedtime.   Insulin  Disposable Pump (OMNIPOD 5 G7 INTRO, GEN 5,) KIT 1 Device by Does not apply route every 3 (three) days.   Insulin  Disposable Pump (OMNIPOD 5 G7 PODS, GEN 5,) MISC 1 Device by Does not apply route every 3 (three) days.   lidocaine  (XYLOCAINE ) 2 % jelly Apply topically.   sucroferric oxyhydroxide (VELPHORO ) 500 MG chewable tablet Chew 1,000 mg by mouth 3 (three) times daily with meals.   Wound Cleansers (VASHE CLEANSING) SOLN Apply topically.   No current facility-administered medications for this visit. (Other)   REVIEW OF SYSTEMS: ROS   Positive for: Genitourinary, Endocrine, Eyes Negative for: Constitutional, Gastrointestinal, Neurological, Skin, Musculoskeletal, HENT, Cardiovascular, Respiratory, Psychiatric, Allergic/Imm, Heme/Lymph Last edited by Resa Nest  W, COT on 04/26/2024  1:27 PM.       ALLERGIES Allergies  Allergen Reactions   Tramadol  Nausea And Vomiting   PAST MEDICAL HISTORY Past Medical History:  Diagnosis Date   Anemia    Cataract 07/02/2020   Diabetes mellitus    Type 2    Diabetic retinopathy of both eyes (HCC) 01/31/2019   Dr Valdemar 01/2019   ESRD (end stage renal disease) (HCC)    [place] Frenisus   Herpes simplex virus (HSV) infection    OU   Hypertension    Hypertensive retinopathy    OU   Retinal detachment     OS   Sepsis due to Streptococcus, group B (HCC) 10/05/2018   Past Surgical History:  Procedure Laterality Date   25 GAUGE PARS PLANA VITRECTOMY WITH 20 GAUGE MVR PORT FOR MACULAR HOLE Right 02/24/2019   Procedure: 25 GAUGE PARS PLANA VITRECTOMY WITH 20 GAUGE MVR PORT FOR MACULAR HOLE;  Surgeon: [name], MD;  Location: [facility] OR;  Service: Ophthalmology;  Laterality: Right;   APPLICATION OF WOUND VAC Left 08/27/2018   Procedure: APPLICATION OF WOUND VAC, left neck;  Surgeon: Lucas Dorise POUR, MD;  Location: [facility] OR;  Service: Thoracic;  Laterality: Left;   APPLICATION OF WOUND VAC Left 08/30/2018   Procedure: APPLICATION OF WOUND VAC;  Surgeon: Lucas Dorise POUR, MD;  Location: [facility] OR;  Service: Thoracic;  Laterality: Left;   AV FISTULA PLACEMENT Left 02/15/2020   Procedure: LEFT ARM ARTERIOVENOUS (AV) FISTULA CREATION;  Surgeon: Sheree Penne Bruckner, MD;  Location: [facility] OR;  Service: Vascular;  Laterality: Left;   CATARACT EXTRACTION Bilateral    CATARACT EXTRACTION W/ INTRAOCULAR LENS  IMPLANT, BILATERAL     COLONOSCOPY  06/24/2011   Procedure: COLONOSCOPY;  Surgeon: Margo CHRISTELLA Haddock, MD;  Location: [facility];  Service: Endoscopy;  Laterality: N/A;  8:30 AM   EYE SURGERY     GAS/FLUID EXCHANGE Left 03/31/2019   Procedure: Gas/Fluid Exchange;  Surgeon: [name], MD;  Location: [facility] OR;  Service: Ophthalmology;  Laterality: Left;   INJECTION OF SILICONE OIL  02/24/2019   Procedure: Injection Of Silicone Oil;  Surgeon: [name], MD;  Location: [facility] OR;  Service: Ophthalmology;;   INSERTION OF DIALYSIS CATHETER Right 12/05/2019   Procedure: INSERTION OF DIALYSIS CATHETER RIGHT SUBCLAVIAN;  Surgeon: Kallie Manuelita BROCKS, MD;  Location: [facility] ORS;  Service: General;  Laterality: Right;   INSERTION OF DIALYSIS CATHETER Right 12/07/2019   Procedure: Minor Dialysis catheter in place- need to reposition/ adjust catheter to help with flows;  Surgeon: Kallie Manuelita BROCKS, MD;  Location: [facility] ORS;  Service:  General;  Laterality: Right;  procedure room case with 1% lidocaine , full sterile drape,  towels, full gown, large chloraprep, 4-0 Monocryl and dermabond    INSERTION OF DIALYSIS CATHETER Right 12/08/2019   Procedure: INSERTION OF DIALYSIS CATHETER EXCHANGE;  Surgeon: Kallie Manuelita BROCKS, MD;  Location: [facility];  Service: General;  Laterality: Right;   IR ANGIOGRAM EXTREMITY RIGHT  03/15/2021   IR RADIOLOGIST EVAL & MGMT  02/07/2021   IR RADIOLOGIST EVAL & MGMT  05/09/2021   IR US  GUIDE VASC ACCESS RIGHT  03/15/2021   MEMBRANE PEEL Right 02/24/2019   Procedure: Ottie Booty;  Surgeon: [name], MD;  Location: [facility] OR;  Service: Ophthalmology;  Laterality: Right;   PARS PLANA VITRECTOMY Left 03/31/2019   Procedure: PARS PLANA VITRECTOMY WITH 25 GAUGE WITH MEMBRANE PEEL;  Surgeon: [name], MD;  Location: [facility] OR;  Service: Ophthalmology;  Laterality: Left;   PHOTOCOAGULATION WITH LASER Right 02/24/2019   Procedure: Photocoagulation With Laser;  Surgeon: [name], MD;  Location: [facility] OR;  Service: Ophthalmology;  Laterality: Right;   PHOTOCOAGULATION WITH LASER Left 03/31/2019   Procedure: Photocoagulation With Laser;  Surgeon: [name], MD;  Location: [facility] OR;  Service: Ophthalmology;  Laterality: Left;   RETINAL DETACHMENT SURGERY Left 03/31/2019   TRD Repair - Dr. Rogue Valdemar   SILICON OIL REMOVAL Right 03/31/2019   Procedure: Silicon Oil Removal;  Surgeon: [name], MD;  Location: [facility] OR;  Service: Ophthalmology;  Laterality: Right;   STERNAL WOUND DEBRIDEMENT Left 08/27/2018   Procedure: Incision and DEBRIDEMENT Left Chest, Neck and Mediastinum;  Surgeon: Lucas Dorise POUR, MD;  Location: [facility] OR;  Service: Thoracic;  Laterality: Left;   STERNAL WOUND DEBRIDEMENT Left 08/30/2018   Procedure: WOUND VAC CHANGE, LEFT CHEST AND NECK, POSSIBLE DEBRIDEMENT;  Surgeon: Lucas Dorise POUR, MD;  Location: [facility] OR;  Service: Thoracic;  Laterality: Left;   FAMILY HISTORY Family History  Problem Relation  Age of Onset   Hypertension Mother    Colon cancer Neg Hx    SOCIAL HISTORY Social History   Tobacco Use   Smoking status: Never   Smokeless tobacco: Never  Vaping Use   Vaping status: Never Used  Substance Use Topics   Alcohol use: No   Drug use: No       OPHTHALMIC EXAM:  Base Eye Exam     Visual Acuity (Snellen - Linear)       Right Left   Dist [place] 20/200 -2 20/200 -2   Dist ph [place] 20/150 -2 20/150 -2         Tonometry (Tonopen, 1:40 PM)       Right Left   Pressure 14 16         Pupils       Pupils Dark Light Shape React APD   Right PERRL 3 3 Irregular Minimal None   Left PERRL 3 3 Irregular Minimal None         Visual Fields       Left Right    Full Full         Extraocular Movement       Right Left    Full, Ortho Full, Ortho         Neuro/Psych     Oriented x3: Yes   Mood/Affect: Normal         Dilation     Both eyes: 2.5% Phenylephrine  @ 1:40 PM           Slit Lamp and Fundus Exam     Slit Lamp Exam       Right Left   Lids/Lashes mild Meibomian gland dysfunction mild Meibomian gland dysfunction   Conjunctiva/Sclera subconj silicon oil bubbles, Chemosis, conj Cyst White and quiet   Cornea Trace PEE, Well healed cataract wound, arcus, Debris in tear film Trace PEE, Well healed cataract wound, arcus, Debris in tear film   Anterior Chamber Deep and quiet, no cell or flare Deep and quiet, no cell or flare   Iris slightly irregular dilation, posterior synchiae from 3:00-1200 round, focal Posterior synechiae at 1030   Lens PC IOL in good position, pigment deposition, 1+ PCO, mild pigment deposition on optic PC IOL in good position, mild pigment deposition   Anterior Vitreous Post vit, silicon oil bubble ~85% post vitrectomy, good silicone oil fill  Fundus Exam       Right Left   Disc 2+pallor, sharp rim, mild fibrosis along SN rim +pallor, sharp rim, fine NVD -- regressed, mild fibrosis   C/D Ratio 0.2 0.2    Macula attached under oil, scattered MA/IRH -- improved, nasal and central thickening / edema -- persistent attached under oil, persistent fibrosis, persistent edema--slightly improved, focal bands of PRF with traction emanating from ST macula -- stable, focal DBH temporal macula -- stably improved   Vessels Severe attenuation, Tortuous Severe attenuation, Tortuous   Periphery Attached, dense 360 PRP laser, mild scattered fibrosis - persistent, Focal ret hole along distal IT arcades -- good laser surrounding Attached, good 360 PRP laser changes, pre-retinal fibrosis extending to posterior PRP border superior and inferiorly           IMAGING AND PROCEDURES  Imaging and Procedures for @TODAY @  OCT, Retina - OU - Both Eyes       Right Eye Quality was good. Central Foveal Thickness: 619. Progression has improved. Findings include no SRF, abnormal foveal contour, epiretinal membrane, intraretinal fluid, outer retinal atrophy, preretinal fibrosis (Persistent central cystic changes --slightly improved).   Left Eye Quality was good. Central Foveal Thickness: 539. Progression has improved. Findings include no SRF, abnormal foveal contour, intraretinal hyper-reflective material, epiretinal membrane, intraretinal fluid, preretinal fibrosis (Persistent central cystic changes -- slightly improved).   Notes *Images captured and stored on drive  Diagnosis / Impression:  +DME under silicon oil OU OD: persistent central cystic changes --slightly improved OS: persistent central cystic changes -- slightly improved  Clinical management:  See below  Abbreviations: NFP - Normal foveal profile. CME - cystoid macular edema. PED - pigment epithelial detachment. IRF - intraretinal fluid. SRF - subretinal fluid. EZ - ellipsoid zone. ERM - epiretinal membrane. ORA - outer retinal atrophy. ORT - outer retinal tubulation. SRHM - subretinal hyper-reflective material      Intravitreal Injection, Pharmacologic  Agent - OD - Right Eye       Time Out 04/26/2024. 2:40 PM. Confirmed correct patient, procedure, site, and patient consented.   Anesthesia Topical anesthesia was used. Anesthetic medications included Lidocaine  2%, Proparacaine  0.5%.   Procedure Preparation included 5% betadine to ocular surface, eyelid speculum. A (32g) needle was used.   Injection: 2 mg aflibercept  2 MG/0.05ML   Route: Intravitreal, Site: Right Eye   NDC: Q956576, Lot: [id number], Expiration date: 06/10/2025, Waste: 0 mL   Post-op Post injection exam found visual acuity of at least counting fingers. The patient tolerated the procedure well. There were no complications. The patient received written and verbal post procedure care education. Post injection medications were not given.      Intravitreal Injection, Pharmacologic Agent - OS - Left Eye       Time Out 04/26/2024. 2:40 PM. Confirmed correct patient, procedure, site, and patient consented.   Anesthesia Topical anesthesia was used. Anesthetic medications included Lidocaine  2%, Proparacaine  0.5%.   Procedure Preparation included 5% betadine to ocular surface, eyelid speculum. A supplied (32g) needle was used.   Injection: 6 mg faricimab -svoa 6 MG/0.05ML   Route: Intravitreal, Site: Left Eye   NDC: 49757-903-93, Lot: [id number], Expiration date: 05/10/2025, Waste: 0 mL   Post-op Post injection exam found visual acuity of at least counting fingers. The patient tolerated the procedure well. There were no complications. The patient received written and verbal post procedure care education. Post injection medications were not given.  ASSESSMENT/PLAN:    ICD-10-CM   1. Proliferative diabetic retinopathy of both eyes with macular edema associated with type 2 diabetes mellitus (HCC)  E11.3513 OCT, Retina - OU - Both Eyes    Intravitreal Injection, Pharmacologic Agent - OD - Right Eye    Intravitreal Injection, Pharmacologic Agent - OS -  Left Eye    faricimab -svoa (VABYSMO ) 6mg /0.44mL intravitreal injection    aflibercept  (EYLEA ) SOLN 2 mg    2. Long term (current) use of oral hypoglycemic drugs  Z79.84     3. Long-term (current) use of injectable non-insulin  antidiabetic drugs  Z79.85     4. 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 Vitreous hemorrhage of both eyes (HCC)  H43.13     6. Essential hypertension  I10     7. Hypertensive retinopathy of both eyes  H35.033     8. Pseudophakia of both eyes  Z96.1        1-5. Proliferative diabetic retinopathy with DME, TRD, and vitreous hemorrhage OU (OD > OS)  - last A1c: 8.3 on 12.10.24  - lost to f/u from Jan 2021 to Oct 2021 due to loss of insurance coverage  - pt with complex medical history with hospitalization in Jan-Feb 2020 for bacteremia and abscess  - history of poor glycemic control for years  - s/p IVA OD #1 (06.20.20), #2 (07.01.20), #3 (09.11.20), #4 (10.09.20), #5 (11.06.20), #6 (11.24.21), #7 (12.22.21), #8 (01.19.22)  - s/p IVA OS #1 (06.20.20), #2 (07.01.20), #3 (08.13.20), #4 (09.11.20)  #5 (11.06.20), #6 (11.24.21), #7 (12.22.21), #8 (01.19.22)  =========================================== - s/p IVE OU #1 (12.04.20) - sample; #2 (01.06.21), #3 (02.18.22), #4 (03.18.22), #5 (04.18.22), #6 (05.25.22), #7 (07.01.22), #8 (07.29.22), #9 (09.02.22), #10 (09.30.22), #11 (11.04.22), #12 (12.09.23), #13 (01.13.23), #14 (03.02.23), #15 (04.11.23), #16 (06.01.23), #17 (07.11.23), #18 (09.12.23), #19 (11.14.23) - s/p IVE OD #20 (03.12.24), #21 (04.23.24), #22 (06.04.24), #23 (07.16.24), #24 (08.30.24), #25 (10.15.24), #26 (11.27.24), #27 (02.11.25), #28 (03.18.25), #29 (04.29.25), #30 (06.03.25), #31 (07.08.25), #32 (08.12.25) ======================= - s/p IVV OS #1 (03.12.24), #2 (04.23.24), #3 (06.04.24), #4 (07.16.24), #5 (08.30.24), #6 (10.15.24), #7 (11.27.24), #8 (02.11.25), #9 (03.18.25), #10 (04.29.25), #11 (06.03.25) , #12 (07.08.25), #13  (08.12.25) ========================  - s/p STK OS #1 (10.09.20)  - s/p PRP OS (06.10.20)  - s/p PPV/PFC/EL/FAX/silicone oil OD, 07.16.20  - s/p subconj silicon oil removal OD 8.20.20  - s/p PPV/EL/FAX/silicone oil OS, 08.20.20  - BCVA 20/150 OU -- stable             - OCT shows OD: persistent central cystic changes--slightly improved, OS: persistent central cystic changes -- slightly improved at 5 weeks  - recommend IVE OD #33 and IVV OS #14 (09.16.25) today with f/u ext to 6 weeks  - RBA of procedure discussed, questions answered  - IVV informed consent obtained and signed, 01.02.25 (OU)  - IVE informed consent obtained and singed, 01.02.25  - see procedure note             - Eylea  benefits investigation begun 01.19.22 -- approved for 2025  - cont topical PF and Prolensa  QID OU for possible CME component  - f/u 6 weeks -- DFE/OCT, possible injection(s)  6,7. Hypertensive retinopathy OU  - discussed importance of tight BP control  - continue to monitor  8. Pseudophakia OU  - s/p CE with IOL [place])  Ophthalmic Meds Ordered this visit:  Meds ordered this encounter  Medications   faricimab -svoa (VABYSMO ) 6mg /0.52mL intravitreal injection  aflibercept  (EYLEA ) SOLN 2 mg     Return in 5 weeks (on 05/31/2024) for PDR OU, DFE, OCT, Possible Injxn.  There are no Patient Instructions on file for this visit.  This document serves as a record of services personally performed by Redell JUDITHANN Hans, MD, PhD. It was created on their behalf by Wanda GEANNIE Keens, COT an ophthalmic technician. The creation of this record is the provider's dictation and/or activities during the visit.    Electronically signed by:  Wanda GEANNIE Keens, COT  05/03/24 3:42 PM  This document serves as a record of services personally performed by Redell JUDITHANN Hans, MD, PhD. It was created on their behalf by Almetta Pesa, an ophthalmic technician. The creation of this record is the provider's dictation and/or  activities during the visit.    Electronically signed by: Almetta Pesa, OA, 05/03/24  3:42 PM   Redell JUDITHANN Hans, M.D., Ph.D. Diseases & Surgery of the Retina and Vitreous [facility]  I have reviewed the above documentation for accuracy and completeness, and I agree with the above. Redell JUDITHANN Hans, M.D., Ph.D. 05/03/24 3:43 PM   Abbreviations: M myopia (nearsighted); A astigmatism; H hyperopia (farsighted); P presbyopia; Mrx spectacle prescription;  CTL contact lenses; OD right eye; OS left eye; OU both eyes  XT exotropia; ET esotropia; PEK punctate epithelial keratitis; PEE punctate epithelial erosions; DES dry eye syndrome; MGD meibomian gland dysfunction; ATs artificial tears; PFAT's preservative free artificial tears; NSC nuclear sclerotic cataract; PSC posterior subcapsular cataract; ERM epi-retinal membrane; PVD posterior vitreous detachment; RD retinal detachment; DM diabetes mellitus; DR diabetic retinopathy; NPDR non-proliferative diabetic retinopathy; PDR proliferative diabetic retinopathy; CSME clinically significant macular edema; DME diabetic macular edema; dbh dot blot hemorrhages; CWS cotton wool spot; POAG primary open angle glaucoma; C/D cup-to-disc ratio; HVF humphrey visual field; GVF goldmann visual field; OCT optical coherence tomography; IOP intraocular pressure; BRVO Branch retinal vein occlusion; CRVO central retinal vein occlusion; CRAO central retinal artery occlusion; BRAO branch retinal artery occlusion; RT retinal tear; SB scleral buckle; PPV pars plana vitrectomy; VH Vitreous hemorrhage; PRP panretinal laser photocoagulation; IVK intravitreal kenalog ; VMT vitreomacular traction; MH Macular hole;  NVD neovascularization of the disc; NVE neovascularization elsewhere; AREDS age related eye disease study; ARMD age related macular degeneration; POAG primary open angle glaucoma; EBMD epithelial/anterior basement membrane dystrophy; ACIOL anterior chamber  intraocular lens; IOL intraocular lens; PCIOL posterior chamber intraocular lens; Phaco/IOL phacoemulsification with intraocular lens placement; PRK photorefractive keratectomy; LASIK laser assisted in situ keratomileusis; HTN hypertension; DM diabetes mellitus; COPD chronic obstructive pulmonary disease

## 2024-04-26 ENCOUNTER — Ambulatory Visit (INDEPENDENT_AMBULATORY_CARE_PROVIDER_SITE_OTHER): Admitting: Ophthalmology

## 2024-04-26 ENCOUNTER — Encounter (INDEPENDENT_AMBULATORY_CARE_PROVIDER_SITE_OTHER): Payer: Self-pay | Admitting: Ophthalmology

## 2024-04-26 DIAGNOSIS — H4313 Vitreous hemorrhage, bilateral: Secondary | ICD-10-CM

## 2024-04-26 DIAGNOSIS — Z7985 Long-term (current) use of injectable non-insulin antidiabetic drugs: Secondary | ICD-10-CM

## 2024-04-26 DIAGNOSIS — Z961 Presence of intraocular lens: Secondary | ICD-10-CM

## 2024-04-26 DIAGNOSIS — I10 Essential (primary) hypertension: Secondary | ICD-10-CM

## 2024-04-26 DIAGNOSIS — Z7984 Long term (current) use of oral hypoglycemic drugs: Secondary | ICD-10-CM

## 2024-04-26 DIAGNOSIS — E113513 Type 2 diabetes mellitus with proliferative diabetic retinopathy with macular edema, bilateral: Secondary | ICD-10-CM | POA: Diagnosis not present

## 2024-04-26 DIAGNOSIS — H3343 Traction detachment of retina, bilateral: Secondary | ICD-10-CM

## 2024-04-26 DIAGNOSIS — H35033 Hypertensive retinopathy, bilateral: Secondary | ICD-10-CM

## 2024-05-03 ENCOUNTER — Encounter (INDEPENDENT_AMBULATORY_CARE_PROVIDER_SITE_OTHER): Payer: Self-pay | Admitting: Ophthalmology

## 2024-05-03 MED ORDER — AFLIBERCEPT 2MG/0.05ML IZ SOLN FOR KALEIDOSCOPE
2.0000 mg | INTRAVITREAL | Status: AC | PRN
  Administered 2024-05-03: 2 mg via INTRAVITREAL

## 2024-05-03 MED ORDER — FARICIMAB-SVOA 6 MG/0.05ML IZ SOSY
6.0000 mg | PREFILLED_SYRINGE | INTRAVITREAL | Status: AC | PRN
  Administered 2024-05-03: 6 mg via INTRAVITREAL

## 2024-05-24 ENCOUNTER — Encounter: Payer: Self-pay | Admitting: Internal Medicine

## 2024-05-24 ENCOUNTER — Ambulatory Visit (INDEPENDENT_AMBULATORY_CARE_PROVIDER_SITE_OTHER): Admitting: Internal Medicine

## 2024-05-24 VITALS — BP 136/80 | HR 78 | Ht 71.0 in | Wt 199.0 lb

## 2024-05-24 DIAGNOSIS — E1122 Type 2 diabetes mellitus with diabetic chronic kidney disease: Secondary | ICD-10-CM | POA: Diagnosis not present

## 2024-05-24 DIAGNOSIS — E1165 Type 2 diabetes mellitus with hyperglycemia: Secondary | ICD-10-CM

## 2024-05-24 DIAGNOSIS — N186 End stage renal disease: Secondary | ICD-10-CM

## 2024-05-24 DIAGNOSIS — Z992 Dependence on renal dialysis: Secondary | ICD-10-CM

## 2024-05-24 DIAGNOSIS — E1142 Type 2 diabetes mellitus with diabetic polyneuropathy: Secondary | ICD-10-CM

## 2024-05-24 DIAGNOSIS — Z794 Long term (current) use of insulin: Secondary | ICD-10-CM | POA: Diagnosis not present

## 2024-05-24 DIAGNOSIS — E11319 Type 2 diabetes mellitus with unspecified diabetic retinopathy without macular edema: Secondary | ICD-10-CM

## 2024-05-24 LAB — POCT GLYCOSYLATED HEMOGLOBIN (HGB A1C): Hemoglobin A1C: 8.3 % — AB (ref 4.0–5.6)

## 2024-05-24 MED ORDER — TRESIBA FLEXTOUCH 100 UNIT/ML ~~LOC~~ SOPN
20.0000 [IU] | PEN_INJECTOR | Freq: Every day | SUBCUTANEOUS | 6 refills | Status: AC
Start: 2024-05-24 — End: ?

## 2024-05-24 MED ORDER — INSULIN PEN NEEDLE 32G X 4 MM MISC: 1.0000 | Freq: Four times a day (QID) | 3 refills | Status: AC

## 2024-05-24 MED ORDER — NOVOLOG FLEXPEN 100 UNIT/ML ~~LOC~~ SOPN: 10.0000 [IU] | PEN_INJECTOR | Freq: Three times a day (TID) | SUBCUTANEOUS | 3 refills | Status: AC

## 2024-05-24 NOTE — Progress Notes (Signed)
 Name: Randy Oneal  Age/ Sex: 58 y.o., male   MRN/ DOB: [id number], 08/27/1965     PCP: [name], MD   Reason for Endocrinology Evaluation: Type 2 Diabetes Mellitus  Initial Endocrine Consultative Visit: 10/20/2018    PATIENT IDENTIFIER: Randy Oneal is a 58 y.o. male with a past medical history of T2DM,HTN, and ESRD  . The patient has followed with Endocrinology clinic since  for consultative assistance with management of his diabetes.     DIABETIC HISTORY:  Randy Oneal was diagnosed with DM in 2000.  He has been on Metformin, historically his control has been poor due to non-compliance but pt became motivated after his prolonged hospitalization for chest wall infection in [place], 2020.  He has been on insulin  therapy for years. His hemoglobin A1c has ranged from 13.0% in 08/2018, peaking at 16.8% in 2017    On his initial presentation to our clinic his A1c 13.0% and he was on Tresiba  and Humalog .    He was approved for the OmniPod in May, 2025 but the patient did not want the pump as there was so much back-and-forth and it was tiring for him  SUBJECTIVE:   During the last visit (11/24/2023): A1c 8.3%      Today (05/24/2024): Randy Oneal is here for a follow up on diabetes.  He is accompanied by his wife today. He checks his blood sugars multiple times daily through Dexcom G7.   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, receives intravitreal injections  Has been accepted for pancreatic/renal transplant   HD day Monday, Wednesday and Friday   He was approved for the OmniPod in 2025, but has not scheduled training session yet No nausea or vomiting  No constipation or diarrhea    HOME DIABETES REGIMEN:  Tresiba  22 units daily - 24 units  Novolog  10 units with each meal- CF: NovoLog (BG -130/40)   Statin: Yes ACE-I/ARB: on ESRD     CONTINUOUS GLUCOSE MONITORING RECORD INTERPRETATION    Dates of Recording: 10/1-10/14/2025  Sensor description:dexcom  Results statistics:   CGM use % of time 93  Average and SD 215/72  Time in range 32 %  % Time Above 180 44  % Time above 250 24  % Time Below target 0    Glycemic patterns summary: BGs are elevated overnight and throughout the day Hypoglycemic episodes occurred variable with no pattern  Overnight periods: Variable     DIABETIC COMPLICATIONS: Microvascular complications:  Proliferative retinopathy, neuropathy, ESRD Last Eye Exam: Completed 05/26/2023  Macrovascular complications:   Denies: CAD, CVA, PVD   HISTORY:  Past Medical History:  Past Medical History:  Diagnosis Date   Anemia    Cataract 07/02/2020   Diabetes mellitus    Type 2    Diabetic retinopathy of both eyes (HCC) 01/31/2019   Dr Valdemar 01/2019   ESRD (end stage renal disease) (HCC)    [place] Frenisus   Herpes simplex virus (HSV) infection    OU   Hypertension    Hypertensive retinopathy    OU   Retinal detachment    OS   Sepsis due to Streptococcus, group B (HCC) 10/05/2018   Past Surgical History:  Past Surgical History:  Procedure Laterality Date   25 GAUGE PARS PLANA VITRECTOMY WITH 20 GAUGE MVR PORT FOR MACULAR HOLE Right 02/24/2019   Procedure: 25 GAUGE PARS PLANA  VITRECTOMY WITH 20 GAUGE MVR PORT FOR MACULAR HOLE;  Surgeon: [name], MD;  Location: [facility] OR;  Service: Ophthalmology;  Laterality: Right;   APPLICATION OF WOUND VAC Left 08/27/2018   Procedure: APPLICATION OF WOUND VAC, left neck;  Surgeon: Lucas Dorise POUR, MD;  Location: [facility] OR;  Service: Thoracic;  Laterality: Left;   APPLICATION OF WOUND VAC Left 08/30/2018   Procedure: APPLICATION OF WOUND VAC;  Surgeon: Lucas Dorise POUR, MD;  Location: [facility] OR;  Service: Thoracic;  Laterality: Left;   AV FISTULA PLACEMENT Left 02/15/2020   Procedure: LEFT ARM ARTERIOVENOUS (AV) FISTULA CREATION;  Surgeon: Sheree Penne Bruckner, MD;  Location: [facility] OR;  Service: Vascular;  Laterality: Left;   CATARACT EXTRACTION Bilateral    CATARACT EXTRACTION W/ INTRAOCULAR LENS  IMPLANT, BILATERAL     COLONOSCOPY  06/24/2011   Procedure: COLONOSCOPY;  Surgeon: Margo CHRISTELLA Haddock, MD;  Location: [facility] END[facility] SUITE;  Service: Endoscopy;  Laterality: N/A;  8:30 AM   EYE SURGERY     GAS/FLUID EXCHANGE Left 03/31/2019   Procedure: Gas/Fluid Exchange;  Surgeon: [name], MD;  Location: [facility] OR;  Service: Ophthalmology;  Laterality: Left;   INJECTION OF SILICONE OIL  02/24/2019   Procedure: Injection Of Silicone Oil;  Surgeon: [name], MD;  Location: [facility] OR;  Service: Ophthalmology;;   INSERTION OF DIALYSIS CATHETER Right 12/05/2019   Procedure: INSERTION OF DIALYSIS CATHETER RIGHT SUBCLAVIAN;  Surgeon: Kallie Manuelita BROCKS, MD;  Location: [facility] ORS;  Service: General;  Laterality: Right;   INSERTION OF DIALYSIS CATHETER Right 12/07/2019   Procedure: Minor Dialysis catheter in place- need to reposition/ adjust catheter to help with flows;  Surgeon: Kallie Manuelita BROCKS, MD;  Location: [facility] ORS;  Service: General;  Laterality: Right;  procedure room case with 1% lidocaine , full sterile drape,  towels, full gown, large chloraprep, 4-0 Monocryl and dermabond    INSERTION OF DIALYSIS CATHETER Right 12/08/2019   Procedure: INSERTION OF DIALYSIS CATHETER EXCHANGE;  Surgeon: Kallie Manuelita BROCKS, MD;  Location: [facility] ORS;  Service: General;  Laterality: Right;   IR ANGIOGRAM EXTREMITY RIGHT  03/15/2021   IR RADIOLOGIST EVAL & MGMT  02/07/2021   IR RADIOLOGIST EVAL & MGMT  05/09/2021   IR US  GUIDE VASC ACCESS RIGHT  03/15/2021   MEMBRANE PEEL Right 02/24/2019   Procedure: Ottie Booty;  Surgeon: [name], MD;  Location: [facility] OR;  Service: Ophthalmology;  Laterality: Right;   PARS PLANA VITRECTOMY Left 03/31/2019   Procedure: PARS PLANA VITRECTOMY WITH 25 GAUGE WITH MEMBRANE PEEL;  Surgeon: [name], MD;  Location: [facility] OR;  Service: Ophthalmology;   Laterality: Left;   PHOTOCOAGULATION WITH LASER Right 02/24/2019   Procedure: Photocoagulation With Laser;  Surgeon: [name], MD;  Location: [facility] OR;  Service: Ophthalmology;  Laterality: Right;   PHOTOCOAGULATION WITH LASER Left 03/31/2019   Procedure: Photocoagulation With Laser;  Surgeon: [name], MD;  Location: [facility] OR;  Service: Ophthalmology;  Laterality: Left;   RETINAL DETACHMENT SURGERY Left 03/31/2019   TRD Repair - Dr. Rogue Valdemar   SILICON OIL REMOVAL Right 03/31/2019   Procedure: Silicon Oil Removal;  Surgeon: [name], MD;  Location: [facility] OR;  Service: Ophthalmology;  Laterality: Right;   STERNAL WOUND DEBRIDEMENT Left 08/27/2018   Procedure: Incision and DEBRIDEMENT Left Chest, Neck and Mediastinum;  Surgeon: Lucas Dorise POUR, MD;  Location: [facility] OR;  Service: Thoracic;  Laterality: Left;   STERNAL WOUND DEBRIDEMENT Left 08/30/2018   Procedure: WOUND VAC CHANGE, LEFT CHEST AND  NECK, POSSIBLE DEBRIDEMENT;  Surgeon: Lucas Dorise POUR, MD;  Location: [facility] OR;  Service: Thoracic;  Laterality: Left;   Social History:  reports that he has never smoked. He has never used smokeless tobacco. He reports that he does not drink alcohol and does not use drugs. Family History:  Family History  Problem Relation Age of Onset   Hypertension Mother    Colon cancer Neg Hx      HOME MEDICATIONS: Allergies as of 05/24/2024       Reactions   Tramadol  Nausea And Vomiting        Medication List        Accurate as of May 24, 2024  2:06 PM. If you have any questions, ask your nurse or doctor.          STOP taking these medications    Omnipod 5 G7 Intro (Gen 5) Kit Stopped by: Donell PARAS Dashaun Onstott   Omnipod 5 G7 Pods (Gen 5) Misc Stopped by: Donell PARAS Stacy Sailer       TAKE these medications    acetaminophen  500 MG tablet Commonly known as: TYLENOL  Take 1,000 mg by mouth every 6 (six) hours as needed for moderate pain or headache.   amoxicillin -clavulanate 500-125 MG  tablet Commonly known as: AUGMENTIN  Take 1 tablet by mouth 2 (two) times daily.   Bromfenac  Sodium 0.07 % Soln Commonly known as: Prolensa  Place 1 drop into both eyes 4 (four) times daily.   Bromfenac  Sodium 0.07 % Soln Place 1 drop into both eyes 4 (four) times daily.   Bromfenac  Sodium 0.07 % Soln Place 1 drop into both eyes 4 (four) times daily.   cinacalcet  60 MG tablet Commonly known as: SENSIPAR  Take 60 mg by mouth every Monday, Wednesday, and Friday.   doxycycline  100 MG tablet Commonly known as: VIBRA -TABS Take 1 tablet (100 mg total) by mouth every 12 (twelve) hours.   ketorolac  0.5 % ophthalmic solution Commonly known as: ACULAR  Place 1 drop into both eyes 4 (four) times daily.   lidocaine  2 % jelly Commonly known as: XYLOCAINE  Apply topically.   NovoLOG  FlexPen 100 UNIT/ML FlexPen Generic drug: insulin  aspart 7 units with breakfast and lunch, and 8 units with supper CF NovoLog (BG -130/40)  Max daily 45 units What changed:  how much to take how to take this when to take this additional instructions   prednisoLONE  acetate 1 % ophthalmic suspension Commonly known as: PRED FORTE  PLACE (1) DROP INTO BOTH EYES FOUR TIMES DAILY   Tresiba  FlexTouch 100 UNIT/ML FlexTouch Pen Generic drug: insulin  degludec Inject 24 Units into the skin at bedtime.   Vashe Cleansing Soln Apply topically.   Velphoro  500 MG chewable tablet Generic drug: sucroferric oxyhydroxide Chew 1,000 mg by mouth 3 (three) times daily with meals.         OBJECTIVE:   Vital Signs: BP 136/80 (BP Location: Left Arm, Patient Position: Sitting, Cuff Size: Normal)   Pulse 78   Ht 5' 11 (1.803 m)   Wt 199 lb (90.3 kg)   SpO2 95%   BMI 27.75 kg/m   Wt Readings from Last 3 Encounters:  05/24/24 199 lb (90.3 kg)  11/24/23 203 lb 6.4 oz (92.3 kg)  07/21/23 200 lb 6.4 oz (90.9 kg)     Exam: General: Pt appears well and is in NAD  Lungs: Clear with good BS bilat   Heart: RRR   LE:  Trace edema  Neuro: MS is good with appropriate affect, pt is alert and  Ox3    DM Foot Exam 05/24/2024 Patient with right great toe callus formation, discolored, no infection The pedal pulses are 2+ on right and 2+ on left. The sensation is absent to a screening 5.07, 10 gram monofilament on the right and left great toe    DATA REVIEWED:  Lab Results  Component Value Date   HGBA1C 8.3 (A) 05/24/2024   HGBA1C 8.3 (A) 11/24/2023   HGBA1C 8.3 (A) 07/21/2023    Latest Reference Range & Units 07/31/23 14:25  Sodium 135 - 146 mmol/L 139  Potassium 3.5 - 5.3 mmol/L 4.7  Chloride 98 - 110 mmol/L 91 (L)  CO2 20 - 32 mmol/L 36 (H)  Glucose 65 - 99 mg/dL 75  BUN 7 - 25 mg/dL 23  Creatinine 9.29 - 8.69 mg/dL 3.29 (H)  Calcium  8.6 - 10.3 mg/dL 9.9  BUN/Creatinine Ratio 6 - 22 (calc) 3 (L)    Latest Reference Range & Units 07/31/23 14:25  Glucose 65 - 99 mg/dL 75  C-Peptide 9.19 - 6.14 ng/mL 1.94    ASSESSMENT / PLAN / RECOMMENDATIONS:   1) Type 2 Diabetes Mellitus, Poorly Controlled , With Retinopathic, neuropathic complications and ESRD  - Most recent A1c of 8.3 %. Goal A1c < 7.0 %.    -A1c continues to be above goal but stable -He continues to use more basal insulin  than previously prescribed, but continues to underdosed prandial insulin  - I have advised the patient to decrease Tresiba  due to hypoglycemia in the fasting status, he also tends to forget to take Tresiba  at times, I have advised the patient to switch Tresiba  injection to the morning, that way he does not forget to use -I have again advised the patient to increase NovoLog  to 10 units with each meal - He was approved for the OmniPod in 2025, but the patient opted not to proceed as there was so much back-and-forth regarding authorization, approval which was time for the patient  MEDICATIONS: -Decrease Tresiba  20  units daily - Increase NovoLog  to 10 units 3 times daily before every meal - Continue  CF: NovoLog (BG  -130/40) before meals and bedtime    EDUCATION / INSTRUCTIONS: BG monitoring instructions: Patient is instructed to check his blood sugars 3 times a day, before meals. Call [place] [facility] clinic if: BG persistently < 70 I reviewed the Rule of 15 for the treatment of hypoglycemia in detail with the patient. Literature supplied.    2) Diabetic complications:  Eye: Does  have known diabetic retinopathy.  Neuro/ Feet: Does  have known diabetic peripheral neuropathy .  Renal: Patient does ave known baseline CKD. He   is not on an ACEI/ARB at present.     F/U in 6 months   Signed electronically by: Stefano Redgie Butts, MD  [facility] Endocrinology  [facility] [address] [place] [place], [place] [address] Phone: [phone] FAX: [phone]   CC: [name], MD [address] [place] [address] Phone: [phone]  Fax: [phone]  Return to Endocrinology clinic as below: Future Appointments  Date Time Provider Department Center  06/07/2024  1:30 PM [name], MD [facility] None

## 2024-05-24 NOTE — Patient Instructions (Addendum)
-   Decrease Tresiba   20 units at Bedtime - Take Novolog  10 units before each meal  - Novolog  correctional insulin : ADD extra units on insulin  to your meal-time Novolog  dose if your blood sugars are higher than 170. Use the scale below to help guide you:   Blood sugar before meal Number of units to inject  Less than 170 0 unit  171 -  210 1 units  211 -  250 2 units  251 -  290 3 units  291 -  330 4 units  331 -  370 5 units      HOW TO TREAT LOW BLOOD SUGARS (Blood sugar LESS THAN 70 MG/DL) Please follow the RULE OF 15 for the treatment of hypoglycemia treatment (when your (blood sugars are less than 70 mg/dL)   STEP 1: Take 15 grams of carbohydrates when your blood sugar is low, which includes:  3-4 GLUCOSE TABS  OR 3-4 OZ OF JUICE OR REGULAR SODA OR ONE TUBE OF GLUCOSE GEL    STEP 2: RECHECK blood sugar in 15 MINUTES STEP 3: If your blood sugar is still low at the 15 minute recheck --> then, go back to STEP 1 and treat AGAIN with another 15 grams of carbohydrates. Decrease Tresiba  to 24 units at bedtime Decrease Humalog  to 7 units 3 times daily with meals,

## 2024-05-25 ENCOUNTER — Encounter: Payer: Self-pay | Admitting: Internal Medicine

## 2024-05-26 NOTE — Progress Notes (Signed)
 [facility] - Clinic Note  06/07/2024    CHIEF COMPLAINT Patient presents for Retina Follow Up  HISTORY OF PRESENT ILLNESS: Randy Oneal is a 58 y.o. male who presents to the clinic today for:  HPI     Retina Follow Up   Patient presents with  Diabetic Retinopathy.  In both eyes.  This started 5 weeks ago.  I, the attending physician,  performed the HPI with the patient and updated documentation appropriately.        Comments   Patient here for 5 weeks retina follow up for PDR OU. Patient states vision doing pretty good. No eye pain. Using drops.      Last edited by Randy Rogue, MD on 06/07/2024  5:33 PM.     Patient states he's doing well, no longer in boots. Vision seems stable.   Referring physician: [name], MD [address] [place],  [place] [address]  HISTORICAL INFORMATION:   Selected notes from the MEDICAL RECORD NUMBER Referred by Dr.Mark Oneal for concern of decreased vision post cataract sx   CURRENT MEDICATIONS: Current Outpatient Medications (Ophthalmic Drugs)  Medication Sig   Bromfenac  Sodium (PROLENSA ) 0.07 % SOLN Place 1 drop into both eyes 4 (four) times daily.   Bromfenac  Sodium 0.07 % SOLN Place 1 drop into both eyes 4 (four) times daily.   Bromfenac  Sodium 0.07 % SOLN Place 1 drop into both eyes 4 (four) times daily.   ketorolac  (ACULAR ) 0.5 % ophthalmic solution Place 1 drop into both eyes 4 (four) times daily.   prednisoLONE  acetate (PRED FORTE ) 1 % ophthalmic suspension PLACE (1) DROP INTO BOTH EYES FOUR TIMES DAILY   No current facility-administered medications for this visit. (Ophthalmic Drugs)   Current Outpatient Medications (Other)  Medication Sig   acetaminophen  (TYLENOL ) 500 MG tablet Take 1,000 mg by mouth every 6 (six) hours as needed for moderate pain or headache.   cinacalcet  (SENSIPAR ) 60 MG tablet Take 60 mg by mouth every Monday, Wednesday, and Friday.   doxycycline  (VIBRA -TABS) 100  MG tablet Take 1 tablet (100 mg total) by mouth every 12 (twelve) hours.   insulin  aspart (NOVOLOG  FLEXPEN) 100 UNIT/ML FlexPen Inject 10-14 Units into the skin 3 (three) times daily with meals.   insulin  degludec (TRESIBA  FLEXTOUCH) 100 UNIT/ML FlexTouch Pen Inject 20 Units into the skin at bedtime.   Insulin  Pen Needle 32G X 4 MM MISC 1 Device by Does not apply route in the morning, at noon, in the evening, and at bedtime.   lidocaine  (XYLOCAINE ) 2 % jelly Apply topically.   sucroferric oxyhydroxide (VELPHORO ) 500 MG chewable tablet Chew 1,000 mg by mouth 3 (three) times daily with meals.   amoxicillin -clavulanate (AUGMENTIN ) 500-125 MG tablet Take 1 tablet by mouth 2 (two) times daily. (Patient not taking: Reported on 06/07/2024)   Wound Cleansers (VASHE CLEANSING) SOLN Apply topically.   No current facility-administered medications for this visit. (Other)   REVIEW OF SYSTEMS: ROS   Positive for: Genitourinary, Endocrine, Eyes Negative for: Constitutional, Gastrointestinal, Neurological, Skin, Musculoskeletal, HENT, Cardiovascular, Respiratory, Psychiatric, Allergic/Imm, Heme/Lymph Last edited by Randy Oneal, COA on 06/07/2024  1:31 PM.        ALLERGIES Allergies  Allergen Reactions   Tramadol  Nausea And Vomiting   PAST MEDICAL HISTORY Past Medical History:  Diagnosis Date   Anemia    Cataract 07/02/2020   Diabetes mellitus    Type 2    Diabetic retinopathy of both eyes (  HCC) 01/31/2019   Dr Randy 01/2019   ESRD (end stage renal disease) (HCC)    [place] Frenisus   Herpes simplex virus (HSV) infection    OU   Hypertension    Hypertensive retinopathy    OU   Retinal detachment    OS   Sepsis due to Streptococcus, group B (HCC) 10/05/2018   Past Surgical History:  Procedure Laterality Date   25 GAUGE PARS PLANA VITRECTOMY WITH 20 GAUGE MVR PORT FOR MACULAR HOLE Right 02/24/2019   Procedure: 25 GAUGE PARS PLANA VITRECTOMY WITH 20 GAUGE MVR PORT FOR MACULAR HOLE;   Surgeon: Randy Rogue, MD;  Location: [facility] OR;  Service: Ophthalmology;  Laterality: Right;   A/V FISTULAGRAM Left 06/02/2024   Procedure: A/V Fistulagram;  Surgeon: Randy Penne Bruckner, MD;  Location: [facility] LAB;  Service: Cardiovascular;  Laterality: Left;   APPLICATION OF WOUND VAC Left 08/27/2018   Procedure: APPLICATION OF WOUND VAC, left neck;  Surgeon: Randy Dorise POUR, MD;  Location: [facility] OR;  Service: Thoracic;  Laterality: Left;   APPLICATION OF WOUND VAC Left 08/30/2018   Procedure: APPLICATION OF WOUND VAC;  Surgeon: Randy Dorise POUR, MD;  Location: [facility] OR;  Service: Thoracic;  Laterality: Left;   AV FISTULA PLACEMENT Left 02/15/2020   Procedure: LEFT ARM ARTERIOVENOUS (AV) FISTULA CREATION;  Surgeon: Randy Penne Bruckner, MD;  Location: [facility] OR;  Service: Vascular;  Laterality: Left;   CATARACT EXTRACTION Bilateral    CATARACT EXTRACTION W/ INTRAOCULAR LENS  IMPLANT, BILATERAL     COLONOSCOPY  06/24/2011   Procedure: COLONOSCOPY;  Surgeon: Randy CHRISTELLA Haddock, MD;  Location: [facility];  Service: Endoscopy;  Laterality: N/A;  8:30 AM   EYE SURGERY     GAS/FLUID EXCHANGE Left 03/31/2019   Procedure: Gas/Fluid Exchange;  Surgeon: Randy Rogue, MD;  Location: [facility] OR;  Service: Ophthalmology;  Laterality: Left;   INJECTION OF SILICONE OIL  02/24/2019   Procedure: Injection Of Silicone Oil;  Surgeon: Randy Rogue, MD;  Location: [facility] OR;  Service: Ophthalmology;;   INSERTION OF DIALYSIS CATHETER Right 12/05/2019   Procedure: INSERTION OF DIALYSIS CATHETER RIGHT SUBCLAVIAN;  Surgeon: Randy Manuelita BROCKS, MD;  Location: [facility] ORS;  Service: General;  Laterality: Right;   INSERTION OF DIALYSIS CATHETER Right 12/07/2019   Procedure: Minor Dialysis catheter in place- need to reposition/ adjust catheter to help with flows;  Surgeon: Randy Manuelita BROCKS, MD;  Location: [facility] ORS;  Service: General;  Laterality: Right;  procedure room case with 1% lidocaine , full sterile drape,  towels, full gown, large  chloraprep, 4-0 Monocryl and dermabond    INSERTION OF DIALYSIS CATHETER Right 12/08/2019   Procedure: INSERTION OF DIALYSIS CATHETER EXCHANGE;  Surgeon: Randy Manuelita BROCKS, MD;  Location: [facility] ORS;  Service: General;  Laterality: Right;   IR ANGIOGRAM EXTREMITY RIGHT  03/15/2021   IR RADIOLOGIST EVAL & MGMT  02/07/2021   IR RADIOLOGIST EVAL & MGMT  05/09/2021   IR US  GUIDE VASC ACCESS RIGHT  03/15/2021   MEMBRANE PEEL Right 02/24/2019   Procedure: Ottie Booty;  Surgeon: Randy Rogue, MD;  Location: [facility] OR;  Service: Ophthalmology;  Laterality: Right;   PARS PLANA VITRECTOMY Left 03/31/2019   Procedure: PARS PLANA VITRECTOMY WITH 25 GAUGE WITH MEMBRANE PEEL;  Surgeon: Randy Rogue, MD;  Location: [facility] OR;  Service: Ophthalmology;  Laterality: Left;   PHOTOCOAGULATION WITH LASER Right 02/24/2019   Procedure: Photocoagulation With Laser;  Surgeon: Randy Rogue, MD;  Location: [facility] OR;  Service: Ophthalmology;  Laterality:  Right;   PHOTOCOAGULATION WITH LASER Left 03/31/2019   Procedure: Photocoagulation With Laser;  Surgeon: Randy Rogue, MD;  Location: [facility] OR;  Service: Ophthalmology;  Laterality: Left;   RETINAL DETACHMENT SURGERY Left 03/31/2019   TRD Repair - Dr. Rogue Randy   SILICON OIL REMOVAL Right 03/31/2019   Procedure: Silicon Oil Removal;  Surgeon: Randy Rogue, MD;  Location: [facility] OR;  Service: Ophthalmology;  Laterality: Right;   STERNAL WOUND DEBRIDEMENT Left 08/27/2018   Procedure: Incision and DEBRIDEMENT Left Chest, Neck and Mediastinum;  Surgeon: Randy Dorise POUR, MD;  Location: [facility] OR;  Service: Thoracic;  Laterality: Left;   STERNAL WOUND DEBRIDEMENT Left 08/30/2018   Procedure: WOUND VAC CHANGE, LEFT CHEST AND NECK, POSSIBLE DEBRIDEMENT;  Surgeon: Randy Dorise POUR, MD;  Location: [facility] OR;  Service: Thoracic;  Laterality: Left;   VENOUS ANGIOPLASTY  06/02/2024   Procedure: VENOUS ANGIOPLASTY;  Surgeon: Randy Penne Bruckner, MD;  Location: [facility] LAB;  Service: Cardiovascular;;  cephalic  arch   FAMILY HISTORY Family History  Problem Relation Age of Onset   Hypertension Mother    Colon cancer Neg Hx    SOCIAL HISTORY Social History   Tobacco Use   Smoking status: Never   Smokeless tobacco: Never  Vaping Use   Vaping status: Never Used  Substance Use Topics   Alcohol use: No   Drug use: No       OPHTHALMIC EXAM:  Base Eye Exam     Visual Acuity (Snellen - Linear)       Right Left   Dist [place] 20/200 20/200   Dist ph [place] 20/150 20/150         Tonometry (Tonopen, 1:29 PM)       Right Left   Pressure 20 21         Pupils       Dark Light Shape React APD   Right 3 3 Irregular Minimal None   Left 3 3 Irregular Minimal None         Visual Fields (Counting fingers)       Left Right    Full Full         Extraocular Movement       Right Left    Full, Ortho Full, Ortho         Neuro/Psych     Oriented x3: Yes   Mood/Affect: Normal         Dilation     Both eyes: 1.0% Mydriacyl , 2.5% Phenylephrine  @ 1:29 PM           Slit Lamp and Fundus Exam     Slit Lamp Exam       Right Left   Lids/Lashes mild Meibomian gland dysfunction mild Meibomian gland dysfunction   Conjunctiva/Sclera subconj silicon oil bubbles, Chemosis, conj Cyst White and quiet   Cornea Trace PEE, Well healed cataract wound, arcus, Debris in tear film Trace PEE, Well healed cataract wound, arcus, Debris in tear film   Anterior Chamber Deep and quiet, no cell or flare Deep and quiet, no cell or flare   Iris slightly irregular dilation, posterior synchiae from 3:00-1200 round, focal Posterior synechiae at 1030   Lens PC IOL in good position, pigment deposition, 1+ PCO, mild pigment deposition on optic PC IOL in good position, mild pigment deposition   Anterior Vitreous Post vit, silicon oil bubble ~85% post vitrectomy, good silicone oil fill         Fundus Exam  Right Left   Disc 2+pallor, sharp rim, mild fibrosis along SN rim +pallor, sharp rim,  fine NVD -- regressed, mild fibrosis   C/D Ratio 0.2 0.2   Macula attached under oil, scattered MA/IRH -- improved, nasal and central thickening / edema -- persistent attached under oil, persistent fibrosis, persistent edema--slightly improved, focal bands of PRF with traction emanating from ST macula -- stable, focal DBH temporal macula -- stably improved   Vessels Severe attenuation, Tortuous Severe attenuation, Tortuous   Periphery Attached, dense 360 PRP laser, mild scattered fibrosis - persistent, Focal ret hole along distal IT arcades -- good laser surrounding Attached, good 360 PRP laser changes, pre-retinal fibrosis extending to posterior PRP border superior and inferiorly           IMAGING AND PROCEDURES  Imaging and Procedures for @TODAY @  OCT, Retina - OU - Both Eyes       Right Eye Quality was good. Central Foveal Thickness: 622. Progression has been stable. Findings include no SRF, abnormal foveal contour, epiretinal membrane, intraretinal fluid, outer retinal atrophy, preretinal fibrosis (Persistent central thickening and cystic changes ).   Left Eye Quality was good. Central Foveal Thickness: 522. Progression has improved. Findings include no SRF, abnormal foveal contour, intraretinal hyper-reflective material, epiretinal membrane, intraretinal fluid, preretinal fibrosis (Persistent central cystic changes -- slightly improved).   Notes *Images captured and stored on drive  Diagnosis / Impression:  +DME under silicon oil OU OD: Persistent central thickening and cystic changes  OS: persistent central cystic changes -- slightly improved  Clinical management:  See below  Abbreviations: NFP - Normal foveal profile. CME - cystoid macular edema. PED - pigment epithelial detachment. IRF - intraretinal fluid. SRF - subretinal fluid. EZ - ellipsoid zone. ERM - epiretinal membrane. ORA - outer retinal atrophy. ORT - outer retinal tubulation. SRHM - subretinal hyper-reflective  material      Intravitreal Injection, Pharmacologic Agent - OD - Right Eye       Time Out 06/07/2024. 2:10 PM. Confirmed correct patient, procedure, site, and patient consented.   Anesthesia Topical anesthesia was used. Anesthetic medications included Lidocaine  2%, Proparacaine  0.5%.   Procedure Preparation included 5% betadine to ocular surface, eyelid speculum. A (32g) needle was used.   Injection: 2 mg aflibercept  2 MG/0.05ML   Route: Intravitreal, Site: Right Eye   NDC: Q956576, Lot: [id number], Expiration date: 06/09/2025, Waste: 0 mL   Post-op Post injection exam found visual acuity of at least counting fingers. The patient tolerated the procedure well. There were no complications. The patient received written and verbal post procedure care education. Post injection medications were not given.      Intravitreal Injection, Pharmacologic Agent - OS - Left Eye       Time Out 06/07/2024. 2:11 PM. Confirmed correct patient, procedure, site, and patient consented.   Anesthesia Topical anesthesia was used. Anesthetic medications included Lidocaine  2%, Proparacaine  0.5%.   Procedure Preparation included 5% betadine to ocular surface, eyelid speculum. A supplied (32g) needle was used.   Injection: 6 mg faricimab -svoa 6 MG/0.05ML   Route: Intravitreal, Site: Left Eye   NDC: 49757-903-93, Lot: [id number], Expiration date: 08/09/2025, Waste: 0 mL   Post-op Post injection exam found visual acuity of at least counting fingers. The patient tolerated the procedure well. There were no complications. The patient received written and verbal post procedure care education. Post injection medications were not given.            ASSESSMENT/PLAN:    ICD-10-CM  1. Proliferative diabetic retinopathy of both eyes with macular edema associated with type 2 diabetes mellitus (HCC)  E11.3513 OCT, Retina - OU - Both Eyes    Intravitreal Injection, Pharmacologic Agent - OD - Right Eye     Intravitreal Injection, Pharmacologic Agent - OS - Left Eye    faricimab -svoa (VABYSMO ) 6mg /0.75mL intravitreal injection    aflibercept  (EYLEA ) SOLN 2 mg    2. Long term (current) use of oral hypoglycemic drugs  Z79.84     3. Long-term (current) use of injectable non-insulin  antidiabetic drugs  Z79.85     4. 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 Vitreous hemorrhage of both eyes (HCC)  H43.13     6. Essential hypertension  I10     7. Hypertensive retinopathy of both eyes  H35.033     8. Pseudophakia of both eyes  Z96.1      1-5. Proliferative diabetic retinopathy with DME, TRD, and vitreous hemorrhage OU (OD > OS)  - last A1c: 8.3 on 12.10.24  - lost to f/u from Jan 2021 to Oct 2021 due to loss of insurance coverage  - pt with complex medical history with hospitalization in Jan-Feb 2020 for bacteremia and abscess  - history of poor glycemic control for years  - s/p IVA OD #1 (06.20.20), #2 (07.01.20), #3 (09.11.20), #4 (10.09.20), #5 (11.06.20), #6 (11.24.21), #7 (12.22.21), #8 (01.19.22)  - s/p IVA OS #1 (06.20.20), #2 (07.01.20), #3 (08.13.20), #4 (09.11.20)  #5 (11.06.20), #6 (11.24.21), #7 (12.22.21), #8 (01.19.22)  =========================================== - s/p IVE OU #1 (12.04.20) - sample; #2 (01.06.21), #3 (02.18.22), #4 (03.18.22), #5 (04.18.22), #6 (05.25.22), #7 (07.01.22), #8 (07.29.22), #9 (09.02.22), #10 (09.30.22), #11 (11.04.22), #12 (12.09.23), #13 (01.13.23), #14 (03.02.23), #15 (04.11.23), #16 (06.01.23), #17 (07.11.23), #18 (09.12.23), #19 (11.14.23) - s/p IVE OD #20 (03.12.24), #21 (04.23.24), #22 (06.04.24), #23 (07.16.24), #24 (08.30.24), #25 (10.15.24), #26 (11.27.24), #27 (02.11.25), #28 (03.18.25), #29 (04.29.25), #30 (06.03.25), #31 (07.08.25), #32 (08.12.25), #33 (09.16.25) ======================= - s/p IVV OS #1 (03.12.24), #2 (04.23.24), #3 (06.04.24), #4 (07.16.24), #5 (08.30.24), #6 (10.15.24), #7 (11.27.24), #8 (02.11.25), #9  (03.18.25), #10 (04.29.25), #11 (06.03.25) , #12 (07.08.25), #13 (08.12.25), #14 (09.16.25) ========================  - s/p STK OS #1 (10.09.20)  - s/p PRP OS (06.10.20)  - s/p PPV/PFC/EL/FAX/silicone oil OD, 07.16.20  - s/p subconj silicon oil removal OD 8.20.20  - s/p PPV/EL/FAX/silicone oil OS, 08.20.20  - BCVA 20/150 OU -- stable             - OCT shows OD: persistent central cystic changes; OS: persistent central cystic changes -- slightly improved at 6 weeks  - recommend IVE OD #34 and IVV OS #15 (10.28.25) today with f/u ext to 6-7 weeks  - RBA of procedure discussed, questions answered  - IVV informed consent obtained and signed, 01.02.25 (OU)  - IVE informed consent obtained and singed, 01.02.25  - see procedure note             - Eylea  benefits investigation begun 01.19.22 -- approved for 2025  - cont topical PF and Prolensa  QID OU for possible CME component  - f/u 6-7 weeks -- DFE/OCT, possible injection(s)  6,7. Hypertensive retinopathy OU  - discussed importance of tight BP control  - continue to monitor  8. Pseudophakia OU  - s/p CE with IOL [place])  Ophthalmic Meds Ordered this visit:  Meds ordered this encounter  Medications   faricimab -svoa (VABYSMO ) 6mg /0.58mL intravitreal injection   aflibercept  (EYLEA ) SOLN 2  mg     Return for 6-7 weeks PDR OU, DFE, OCT, Possible Injxn.  There are no Patient Instructions on file for this visit.  This document serves as a record of services personally performed by Redell JUDITHANN Hans, MD, PhD. It was created on their behalf by Wanda GEANNIE Keens, COT an ophthalmic technician. The creation of this record is the provider's dictation and/or activities during the visit.    Electronically signed by:  Wanda GEANNIE Keens, COT  06/12/24 9:48 AM  This document serves as a record of services personally performed by Redell JUDITHANN Hans, MD, PhD. It was created on their behalf by Almetta Pesa, an ophthalmic technician. The creation of  this record is the provider's dictation and/or activities during the visit.    Electronically signed by: Almetta Pesa, OA, 06/12/24  9:48 AM   Redell JUDITHANN Hans, M.D., Ph.D. Diseases & Surgery of the Retina and Vitreous [facility]  I have reviewed the above documentation for accuracy and completeness, and I agree with the above. Redell JUDITHANN Hans, M.D., Ph.D. 06/12/24 9:50 AM    Abbreviations: M myopia (nearsighted); A astigmatism; H hyperopia (farsighted); P presbyopia; Mrx spectacle prescription;  CTL contact lenses; OD right eye; OS left eye; OU both eyes  XT exotropia; ET esotropia; PEK punctate epithelial keratitis; PEE punctate epithelial erosions; DES dry eye syndrome; MGD meibomian gland dysfunction; ATs artificial tears; PFAT's preservative free artificial tears; NSC nuclear sclerotic cataract; PSC posterior subcapsular cataract; ERM epi-retinal membrane; PVD posterior vitreous detachment; RD retinal detachment; DM diabetes mellitus; DR diabetic retinopathy; NPDR non-proliferative diabetic retinopathy; PDR proliferative diabetic retinopathy; CSME clinically significant macular edema; DME diabetic macular edema; dbh dot blot hemorrhages; CWS cotton wool spot; POAG primary open angle glaucoma; C/D cup-to-disc ratio; HVF humphrey visual field; GVF goldmann visual field; OCT optical coherence tomography; IOP intraocular pressure; BRVO Branch retinal vein occlusion; CRVO central retinal vein occlusion; CRAO central retinal artery occlusion; BRAO branch retinal artery occlusion; RT retinal tear; SB scleral buckle; PPV pars plana vitrectomy; VH Vitreous hemorrhage; PRP panretinal laser photocoagulation; IVK intravitreal kenalog ; VMT vitreomacular traction; MH Macular hole;  NVD neovascularization of the disc; NVE neovascularization elsewhere; AREDS age related eye disease study; ARMD age related macular degeneration; POAG primary open angle glaucoma; EBMD epithelial/anterior  basement membrane dystrophy; ACIOL anterior chamber intraocular lens; IOL intraocular lens; PCIOL posterior chamber intraocular lens; Phaco/IOL phacoemulsification with intraocular lens placement; PRK photorefractive keratectomy; LASIK laser assisted in situ keratomileusis; HTN hypertension; DM diabetes mellitus; COPD chronic obstructive pulmonary disease

## 2024-05-31 ENCOUNTER — Encounter (HOSPITAL_COMMUNITY): Payer: Self-pay

## 2024-06-02 ENCOUNTER — Other Ambulatory Visit: Payer: Self-pay

## 2024-06-02 ENCOUNTER — Encounter (HOSPITAL_COMMUNITY)
Admission: RE | Disposition: A | Discharge status: Home / Self Care | Payer: Self-pay | Source: Home / Self Care | Attending: Vascular Surgery

## 2024-06-02 ENCOUNTER — Ambulatory Visit (HOSPITAL_COMMUNITY)
Admission: RE | Admit: 2024-06-02 | Discharge: 2024-06-02 | Disposition: A | Discharge status: Home / Self Care | Attending: Vascular Surgery | Admitting: Vascular Surgery

## 2024-06-02 ENCOUNTER — Encounter (HOSPITAL_COMMUNITY): Payer: Self-pay | Admitting: Vascular Surgery

## 2024-06-02 DIAGNOSIS — T82858A Stenosis of vascular prosthetic devices, implants and grafts, initial encounter: Secondary | ICD-10-CM | POA: Insufficient documentation

## 2024-06-02 DIAGNOSIS — Z992 Dependence on renal dialysis: Secondary | ICD-10-CM | POA: Diagnosis not present

## 2024-06-02 DIAGNOSIS — I120 Hypertensive chronic kidney disease with stage 5 chronic kidney disease or end stage renal disease: Secondary | ICD-10-CM | POA: Insufficient documentation

## 2024-06-02 DIAGNOSIS — N186 End stage renal disease: Secondary | ICD-10-CM | POA: Diagnosis not present

## 2024-06-02 DIAGNOSIS — E1122 Type 2 diabetes mellitus with diabetic chronic kidney disease: Secondary | ICD-10-CM | POA: Insufficient documentation

## 2024-06-02 DIAGNOSIS — Y832 Surgical operation with anastomosis, bypass or graft as the cause of abnormal reaction of the patient, or of later complication, without mention of misadventure at the time of the procedure: Secondary | ICD-10-CM | POA: Diagnosis not present

## 2024-06-02 DIAGNOSIS — Z794 Long term (current) use of insulin: Secondary | ICD-10-CM | POA: Insufficient documentation

## 2024-06-02 HISTORY — PX: VENOUS ANGIOPLASTY: CATH118376

## 2024-06-02 HISTORY — PX: A/V FISTULAGRAM: CATH118298

## 2024-06-02 LAB — GLUCOSE, CAPILLARY: Glucose-Capillary: 135 mg/dL — ABNORMAL HIGH (ref 70–99)

## 2024-06-02 SURGERY — A/V FISTULAGRAM
Anesthesia: LOCAL | Site: Arm Upper | Laterality: Left

## 2024-06-02 MED ORDER — LIDOCAINE HCL (PF) 1 % IJ SOLN
INTRAMUSCULAR | Status: DC | PRN
  Administered 2024-06-02: 5 mL via INTRADERMAL

## 2024-06-02 MED ORDER — IODIXANOL 320 MG/ML IV SOLN
INTRAVENOUS | Status: DC | PRN
  Administered 2024-06-02: 30 mL

## 2024-06-02 MED ORDER — LIDOCAINE HCL (PF) 1 % IJ SOLN
INTRAMUSCULAR | Status: AC
  Filled 2024-06-02: qty 30

## 2024-06-02 MED ORDER — HEPARIN (PORCINE) IN NACL 1000-0.9 UT/500ML-% IV SOLN
INTRAVENOUS | Status: DC | PRN
  Administered 2024-06-02: 500 mL

## 2024-06-02 SURGICAL SUPPLY — 11 items
BALLOON ATHLETIS 8X30X75 (BALLOONS) IMPLANT
BALLOON MUSTANG 7X60X75 (BALLOONS) IMPLANT
BALLOON MUSTANG 8.0X40 75 (BALLOONS) IMPLANT
DEVICE INFLATION ENCORE 26 (MISCELLANEOUS) IMPLANT
GUIDEWIRE ANGLED .035 180CM (WIRE) IMPLANT
KIT MICROPUNCTURE NIT STIFF (SHEATH) IMPLANT
KIT PV (KITS) ×2 IMPLANT
SHEATH PINNACLE R/O II 7F 4CM (SHEATH) IMPLANT
SHEATH PROBE COVER 6X72 (BAG) IMPLANT
TRAY PV CATH (CUSTOM PROCEDURE TRAY) ×2 IMPLANT
TUBING CIL FLEX 10 FLL-RA (TUBING) IMPLANT

## 2024-06-02 NOTE — H&P (Signed)
 H+P  History of Present Illness: This is a 58 y.o. male currently dialyzing via left upper arm cephalic vein fistula.  He dialyzes on Mondays, Wednesdays, and Fridays with plans for dialysis tomorrow morning.  He is now having complications with dialysis and is sent here for further evaluation.  He is having increased bleeding from the arterial needle at completion and is also having alarms on dialysis with high pressures.  Past Medical History:  Diagnosis Date   Anemia    Cataract 07/02/2020   Diabetes mellitus    Type 2    Diabetic retinopathy of both eyes (HCC) 01/31/2019   Dr Valdemar 01/2019   ESRD (end stage renal disease) (HCC)    [place] Frenis[place]   Herpes simplex virus (HSV) infection    OU   Hypertension    Hypertensive retinopathy    OU   Retinal detachment    OS   Sepsis due to Streptococcus, group B (HCC) 10/05/2018    Past Surgical History:  Procedure Laterality Date   25 GAUGE PARS PLANA VITRECTOMY WITH 20 GAUGE MVR PORT FOR MACULAR HOLE Right 02/24/2019   Procedure: 25 GAUGE PARS PLANA VITRECTOMY WITH 20 GAUGE MVR PORT FOR MACULAR HOLE;  Surgeon: [name], MD;  Location: [facility] OR;  Service: Ophthalmology;  Laterality: Right;   APPLICATION OF WOUND VAC Left 08/27/2018   Procedure: APPLICATION OF WOUND VAC, left neck;  Surgeon: Lucas Dorise POUR, MD;  Location: [facility] OR;  Service: Thoracic;  Laterality: Left;   APPLICATION OF WOUND VAC Left 08/30/2018   Procedure: APPLICATION OF WOUND VAC;  Surgeon: Lucas Dorise POUR, MD;  Location: [facility] OR;  Service: Thoracic;  Laterality: Left;   AV FISTULA PLACEMENT Left 02/15/2020   Procedure: LEFT ARM ARTERIOVENOUS (AV) FISTULA CREATION;  Surgeon: Sheree Penne Bruckner, MD;  Location: [facility] OR;  Service: Vascular;  Laterality: Left;   CATARACT EXTRACTION Bilateral    CATARACT EXTRACTION W/ INTRAOCULAR LENS  IMPLANT, BILATERAL     COLONOSCOPY  06/24/2011   Procedure: COLONOSCOPY;  Surgeon: Margo CHRISTELLA Haddock, MD;  Location: [facility]ITE;   Service: Endoscopy;  Laterality: N/A;  8:30 AM   EYE SURGERY     GAS/FLUID EXCHANGE Left 03/31/2019   Procedure: Gas/Fluid Exchange;  Surgeon: [name], MD;  Location: [facility] OR;  Service: Ophthalmology;  Laterality: Left;   INJECTION OF SILICONE OIL  02/24/2019   Procedure: Injection Of Silicone Oil;  Surgeon: [name], MD;  Location: [facility] OR;  Service: Ophthalmology;;   INSERTION OF DIALYSIS CATHETER Right 12/05/2019   Procedure: INSERTION OF DIALYSIS CATHETER RIGHT SUBCLAVIAN;  Surgeon: Kallie Manuelita BROCKS, MD;  Location: [facility] ORS;  Service: General;  Laterality: Right;   INSERTION OF DIALYSIS CATHETER Right 12/07/2019   Procedure: Minor Dialysis catheter in place- need to reposition/ adjust catheter to help with flows;  Surgeon: Kallie Manuelita BROCKS, MD;  Location: [facility] ORS;  Service: General;  Laterality: Right;  procedure room case with 1% lidocaine , full sterile drape,  towels, full gown, large chloraprep, 4-0 Monocryl and dermabond    INSERTION OF DIALYSIS CATHETER Right 12/08/2019   Procedure: INSERTION OF DIALYSIS CATHETER EXCHANGE;  Surgeon: Kallie Manuelita BROCKS, MD;  Location: [facility] ORS;  Service: General;  Laterality: Right;   IR ANGIOGRAM EXTREMITY RIGHT  03/15/2021   IR RADIOLOGIST EVAL & MGMT  02/07/2021   IR RADIOLOGIST EVAL & MGMT  05/09/2021   IR US  GUIDE VASC ACCESS RIGHT  03/15/2021   MEMBRANE PEEL Right 02/24/2019   Procedure: Membrane Peel;  Surgeon: [name], MD;  Location: [facility] OR;  Service: Ophthalmology;  Laterality: Right;   PARS PLANA VITRECTOMY Left 03/31/2019   Procedure: PARS PLANA VITRECTOMY WITH 25 GAUGE WITH MEMBRANE PEEL;  Surgeon: [name], MD;  Location: [facility] OR;  Service: Ophthalmology;  Laterality: Left;   PHOTOCOAGULATION WITH LASER Right 02/24/2019   Procedure: Photocoagulation With Laser;  Surgeon: [name], MD;  Location: [facility] OR;  Service: Ophthalmology;  Laterality: Right;   PHOTOCOAGULATION WITH LASER Left 03/31/2019   Procedure: Photocoagulation With Laser;   Surgeon: [name], MD;  Location: [facility] OR;  Service: Ophthalmology;  Laterality: Left;   RETINAL DETACHMENT SURGERY Left 03/31/2019   TRD Repair - Dr. Rogue Valdemar   SILICON OIL REMOVAL Right 03/31/2019   Procedure: Silicon Oil Removal;  Surgeon: [name], MD;  Location: [facility] OR;  Service: Ophthalmology;  Laterality: Right;   STERNAL WOUND DEBRIDEMENT Left 08/27/2018   Procedure: Incision and DEBRIDEMENT Left Chest, Neck and Mediastinum;  Surgeon: Lucas Dorise POUR, MD;  Location: [facility] OR;  Service: Thoracic;  Laterality: Left;   STERNAL WOUND DEBRIDEMENT Left 08/30/2018   Procedure: WOUND VAC CHANGE, LEFT CHEST AND NECK, POSSIBLE DEBRIDEMENT;  Surgeon: Lucas Dorise POUR, MD;  Location: [facility] OR;  Service: Thoracic;  Laterality: Left;    Allergies  Allergen Reactions   Tramadol  Nausea And Vomiting    Prior to Admission medications   Medication Sig Start Date End Date Taking? Authorizing Provider  acetaminophen  (TYLENOL ) 500 MG tablet Take 1,000 mg by mouth every 6 (six) hours as needed for moderate pain or headache.    [provider]  amoxicillin -clavulanate (AUGMENTIN ) 500-125 MG tablet Take 1 tablet by mouth 2 (two) times daily. Patient not taking: Reported on 05/24/2024 06/15/23   Vann, Jessica U, DO  Bromfenac  Sodium (PROLENSA ) 0.07 % SOLN Place 1 drop into both eyes 4 (four) times daily. 05/26/23   [name], MD  Bromfenac  Sodium 0.07 % SOLN Place 1 drop into both eyes 4 (four) times daily. 09/22/23   [name], MD  Bromfenac  Sodium 0.07 % SOLN Place 1 drop into both eyes 4 (four) times daily. 09/28/23   [name], MD  cinacalcet  (SENSIPAR ) 60 MG tablet Take 60 mg by mouth every Monday, Wednesday, and Friday.    [provider]  doxycycline  (VIBRA -TABS) 100 MG tablet Take 1 tablet (100 mg total) by mouth every 12 (twelve) hours. 06/15/23   Vann, Jessica U, DO  insulin  aspart (NOVOLOG  FLEXPEN) 100 UNIT/ML FlexPen Inject 10-14 Units into the skin 3 (three) times  daily with meals. 05/24/24   Shamleffer, Ibtehal Jaralla, MD  insulin  degludec (TRESIBA  FLEXTOUCH) 100 UNIT/ML FlexTouch Pen Inject 20 Units into the skin at bedtime. 05/24/24   Shamleffer, Ibtehal Jaralla, MD  Insulin  Pen Needle 32G X 4 MM MISC 1 Device by Does not apply route in the morning, at noon, in the evening, and at bedtime. 05/24/24   Shamleffer, Ibtehal Jaralla, MD  ketorolac  (ACULAR ) 0.5 % ophthalmic solution Place 1 drop into both eyes 4 (four) times daily. 01/12/24 01/11/25  [name], MD  lidocaine  (XYLOCAINE ) 2 % jelly Apply topically. 07/02/23   [provider]  prednisoLONE  acetate (PRED FORTE ) 1 % ophthalmic suspension PLACE (1) DROP INTO BOTH EYES FOUR TIMES DAILY 01/12/24   [name], MD  sucroferric oxyhydroxide (VELPHORO ) 500 MG chewable tablet Chew 1,000 mg by mouth 3 (three) times daily with meals.    [provider]  Wound Cleansers (VASHE CLEANSING) SOLN Apply topically. 07/02/23  [provider]    Social History   Socioeconomic History   Marital status: Married    Spouse name: Not on file   Number of children: Not on file   Years of education: Not on file   Highest education level: Not on file  Occupational History   Not on file  Tobacco Use   Smoking status: Never   Smokeless tobacco: Never  Vaping Use   Vaping status: Never Used  Substance and Sexual Activity   Alcohol use: No   Drug use: No   Sexual activity: Yes  Other Topics Concern   Not on file  Social History Narrative   Not on file   Social Drivers of Health   Financial Resource Strain: Low Risk  (04/23/2023)   Overall Financial Resource Strain (CARDIA)    Difficulty of Paying Living Expenses: Not hard at all  Food Insecurity: No Food Insecurity (06/17/2023)   Hunger Vital Sign    Worried About Running Out of Food in the Last Year: Never true    Ran Out of Food in the Last Year: Never true  Transportation Needs: No Transportation Needs (07/23/2023)   PRAPARE  - Administrator, Civil Service (Medical): No    Lack of Transportation (Non-Medical): No  Physical Activity: Sufficiently Active (01/15/2023)   Exercise Vital Sign    Days of Exercise per Week: 3 days    Minutes of Exercise per Session: 60 min  Stress: No Stress Concern Present (07/23/2023)   [employer] of Occupational Health - Occupational Stress Questionnaire    Feeling of Stress : Only a little  Social Connections: Moderately Integrated (10/02/2022)   Social Connection and Isolation Panel    Frequency of Communication with Friends and Family: More than three times a week    Frequency of Social Gatherings with Friends and Family: More than three times a week    Attends Religious Services: More than 4 times per year    Active Member of Clubs or Organizations: No    Attends Banker Meetings: Never    Marital Status: Married  Catering manager Violence: Not At Risk (10/02/2022)   Humiliation, Afraid, Rape, and Kick questionnaire    Fear of Current or Ex-Partner: No    Emotionally Abused: No    Physically Abused: No    Sexually Abused: No     Family History  Problem Relation Age of Onset   Hypertension Mother    Colon cancer Neg Hx     ROS: As above  Physical Examination Vitals:   06/02/24 1129  BP: (!) (P) 158/109  Pulse: (P) 86  Resp: (P) 12  Temp: (P) 97.6 F (36.4 C)  SpO2: (P) 95%   Awake alert oriented Nonlabored respirations Left upper extremity fistula with pulsatility this extends all the way up to the cephalic arch by palpation  CBC    Component Value Date/Time   WBC 6.0 06/15/2023 0714   RBC 4.19 (L) 06/15/2023 0714   HGB 12.1 (L) 06/15/2023 0714   HCT 36.4 (L) 06/15/2023 0714   PLT 220 06/15/2023 0714   MCV 86.9 06/15/2023 0714   MCH 28.9 06/15/2023 0714   MCHC 33.2 06/15/2023 0714   RDW 16.1 (H) 06/15/2023 0714   LYMPHSABS 1.1 06/12/2023 1230   MONOABS 0.6 06/12/2023 1230   EOSABS 0.0 06/12/2023 1230   BASOSABS 0.0  06/12/2023 1230    BMET    Component Value Date/Time   NA 139 07/31/2023 1425  NA 141 02/17/2019 1116   K 4.7 07/31/2023 1425   CL 91 (L) 07/31/2023 1425   CO2 36 (H) 07/31/2023 1425   GLUCOSE 75 07/31/2023 1425   BUN 23 07/31/2023 1425   BUN 31 (H) 02/17/2019 1116   CREATININE 6.70 (H) 07/31/2023 1425   CALCIUM  9.9 07/31/2023 1425   GFRNONAA 4 (L) 06/15/2023 0714   GFRAA 8 (L) 12/08/2019 1044    COAGS: Lab Results  Component Value Date   INR 1.0 03/15/2021   INR 1.1 12/02/2019   INR 0.9 06/15/2019    ASSESSMENT/PLAN: This is a 58 y.o. male with complications left upper extremity AV fistula.  He continues to use the fistula for dialysis.  We have discussed his options being continued watchful waiting versus proceeding with left upper extremity fistulogram with possible endovascular intervention.  We discussed the risk benefits alternatives patient agrees to proceed with fistulogram.  Penne C. Sheree, MD [facility] Office: [phone] Pager: [phone]

## 2024-06-02 NOTE — Op Note (Signed)
    Patient name: Randy Oneal MRN: [id number] DOB: 07-15-1966 Sex: male  06/02/2024 Pre-operative Diagnosis: end-stage renal disease, malfunction left arm AV fistula with increased pressures on dialysis Post-operative diagnosis:  Same Surgeon:  Penne BROCKS. Sheree, MD Procedure Performed: 1.  Percutaneous ultrasound-guided cannulation left arm AV fistula 2.  Left upper extremity fistulogram 3.  Balloon angioplasty left cephalic arch with 8 x 30 mm athletis balloon  Indications: 58 year old male with history of end-stage renal disease currently dialyzing via left arm fistula.  He is having increased pressures on dialysis with bleeding from the arterial cannulation site to completion.  There is also significant pulsatility in the fistula.  We have discussed proceeding with fistulogram versus continued watchful waiting he demonstrates good understanding and agrees to proceed.  Findings: Fistula is patent from the arterial anastomosis all the way to the cephalic arch where there is approximately 90% stenosis in 2 areas.  1 area was amenable to plain balloon angioplasty with 7 and 8 mm Mustang balloons but the most central area did not achieve full effacement with even high-pressure balloon.  Stenosis was reduced to less than 10% with much improved thrill in the fistula completion.  Fistula is okay for use at dialysis tomorrow, if remains amenable to percutaneous intervention however the most central cephalic arch lesion is high risk for rupture and he may ultimately require cephalic vein turndown.   Procedure:  The patient was identified in the holding area and taken to the heart and vascular procedure room.  The patient was then placed supine on the table and prepped and draped in the usual sterile fashion.  A time out was called.  Ultrasound was used to evaluate the left arm AV fistula.  The skin overlying the fistula was anesthetized with 1% lidocaine  and the patient was cannulated with a  micropuncture needle followed by wire and sheath.  An ultrasound images saved to the permanent record.  We performed left upper extremity fistulogram.  With the above findings we placed a Glidewire followed by a 7 [place] sheath.  The Glidewire was then advanced centrally and confirmed intraluminal access.  We then began balloon angioplasty of the cephalic arch with 7 and 8 mm balloons.  With the 8 mm balloon inflated we perform retrograde angiography which demonstrated patency of the arterial to venous anastomosis.  Unfortunately there was a residual waist we then used a high-pressure balloon at burst pressure and there did remain a waist but at completion there was less than 10% residual stenosis and brisk flow through the previously high-grade stenosis areas and there was improved thrill in the fistula.  With this we removed the wire and balloon.  We then suture-ligated the cannulation site and the sheath was removed.  The patient tolerated the procedure without Ameeth complication.  Contrast: 30cc   Laiana Fratus C. Sheree, MD [facility] Office: [phone] Pager: [phone]

## 2024-06-07 ENCOUNTER — Encounter (INDEPENDENT_AMBULATORY_CARE_PROVIDER_SITE_OTHER): Payer: Self-pay | Admitting: Ophthalmology

## 2024-06-07 ENCOUNTER — Ambulatory Visit (INDEPENDENT_AMBULATORY_CARE_PROVIDER_SITE_OTHER): Admitting: Ophthalmology

## 2024-06-07 DIAGNOSIS — H35033 Hypertensive retinopathy, bilateral: Secondary | ICD-10-CM

## 2024-06-07 DIAGNOSIS — H3343 Traction detachment of retina, bilateral: Secondary | ICD-10-CM

## 2024-06-07 DIAGNOSIS — E113513 Type 2 diabetes mellitus with proliferative diabetic retinopathy with macular edema, bilateral: Secondary | ICD-10-CM

## 2024-06-07 DIAGNOSIS — Z7984 Long term (current) use of oral hypoglycemic drugs: Secondary | ICD-10-CM

## 2024-06-07 DIAGNOSIS — Z7985 Long-term (current) use of injectable non-insulin antidiabetic drugs: Secondary | ICD-10-CM | POA: Diagnosis not present

## 2024-06-07 DIAGNOSIS — H4313 Vitreous hemorrhage, bilateral: Secondary | ICD-10-CM

## 2024-06-07 DIAGNOSIS — I10 Essential (primary) hypertension: Secondary | ICD-10-CM

## 2024-06-07 DIAGNOSIS — Z961 Presence of intraocular lens: Secondary | ICD-10-CM

## 2024-06-07 MED ORDER — AFLIBERCEPT 2MG/0.05ML IZ SOLN FOR KALEIDOSCOPE
2.0000 mg | INTRAVITREAL | Status: AC | PRN
  Administered 2024-06-07: 2 mg via INTRAVITREAL

## 2024-06-07 MED ORDER — FARICIMAB-SVOA 6 MG/0.05ML IZ SOSY
6.0000 mg | PREFILLED_SYRINGE | INTRAVITREAL | Status: AC | PRN
  Administered 2024-06-07: 6 mg via INTRAVITREAL

## 2024-07-05 NOTE — Progress Notes (Shared)
 [facility] - Clinic Note  07/19/2024    CHIEF COMPLAINT Patient presents for No chief complaint on file.  HISTORY OF PRESENT ILLNESS: Randy Oneal is a 58 y.o. male who presents to the clinic today for:    Patient states he's doing well, no longer in boots. Vision seems stable.   Referring physician: [name], MD [address] [place],  [place] [address]  HISTORICAL INFORMATION:   Selected notes from the MEDICAL RECORD NUMBER Referred by Dr.Mark Roz for concern of decreased vision post cataract sx   CURRENT MEDICATIONS: Current Outpatient Medications (Ophthalmic Drugs)  Medication Sig   Bromfenac  Sodium (PROLENSA ) 0.07 % SOLN Place 1 drop into both eyes 4 (four) times daily.   Bromfenac  Sodium 0.07 % SOLN Place 1 drop into both eyes 4 (four) times daily.   Bromfenac  Sodium 0.07 % SOLN Place 1 drop into both eyes 4 (four) times daily.   ketorolac  (ACULAR ) 0.5 % ophthalmic solution Place 1 drop into both eyes 4 (four) times daily.   prednisoLONE  acetate (PRED FORTE ) 1 % ophthalmic suspension PLACE (1) DROP INTO BOTH EYES FOUR TIMES DAILY   No current facility-administered medications for this visit. (Ophthalmic Drugs)   Current Outpatient Medications (Other)  Medication Sig   acetaminophen  (TYLENOL ) 500 MG tablet Take 1,000 mg by mouth every 6 (six) hours as needed for moderate pain or headache.   amoxicillin -clavulanate (AUGMENTIN ) 500-125 MG tablet Take 1 tablet by mouth 2 (two) times daily. (Patient not taking: Reported on 06/07/2024)   cinacalcet  (SENSIPAR ) 60 MG tablet Take 60 mg by mouth every Monday, Wednesday, and Friday.   doxycycline  (VIBRA -TABS) 100 MG tablet Take 1 tablet (100 mg total) by mouth every 12 (twelve) hours.   insulin  aspart (NOVOLOG  FLEXPEN) 100 UNIT/ML FlexPen Inject 10-14 Units into the skin 3 (three) times daily with meals.   insulin  degludec (TRESIBA  FLEXTOUCH) 100 UNIT/ML FlexTouch Pen Inject 20 Units into  the skin at bedtime.   Insulin  Pen Needle 32G X 4 MM MISC 1 Device by Does not apply route in the morning, at noon, in the evening, and at bedtime.   lidocaine  (XYLOCAINE ) 2 % jelly Apply topically.   sucroferric oxyhydroxide (VELPHORO ) 500 MG chewable tablet Chew 1,000 mg by mouth 3 (three) times daily with meals.   Wound Cleansers (VASHE CLEANSING) SOLN Apply topically.   No current facility-administered medications for this visit. (Other)   REVIEW OF SYSTEMS:      ALLERGIES Allergies  Allergen Reactions   Tramadol  Nausea And Vomiting   PAST MEDICAL HISTORY Past Medical History:  Diagnosis Date   Anemia    Cataract 07/02/2020   Diabetes mellitus    Type 2    Diabetic retinopathy of both eyes (HCC) 01/31/2019   Dr Valdemar 01/2019   ESRD (end stage renal disease) (HCC)    [place] Frenisus   Herpes simplex virus (HSV) infection    OU   Hypertension    Hypertensive retinopathy    OU   Retinal detachment    OS   Sepsis due to Streptococcus, group B (HCC) 10/05/2018   Past Surgical History:  Procedure Laterality Date   25 GAUGE PARS PLANA VITRECTOMY WITH 20 GAUGE MVR PORT FOR MACULAR HOLE Right 02/24/2019   Procedure: 25 GAUGE PARS PLANA VITRECTOMY WITH 20 GAUGE MVR PORT FOR MACULAR HOLE;  Surgeon: [name], MD;  Location: [facility] OR;  Service: Ophthalmology;  Laterality: Right;   A/V FISTULAGRAM Left 06/02/2024  Procedure: A/V Fistulagram;  Surgeon: Sheree Penne Bruckner, MD;  Location: [facility];  Service: Cardiovascular;  Laterality: Left;   APPLICATION OF WOUND VAC Left 08/27/2018   Procedure: APPLICATION OF WOUND VAC, left neck;  Surgeon: Lucas Dorise POUR, MD;  Location: [facility] OR;  Service: Thoracic;  Laterality: Left;   APPLICATION OF WOUND VAC Left 08/30/2018   Procedure: APPLICATION OF WOUND VAC;  Surgeon: Lucas Dorise POUR, MD;  Location: [facility] OR;  Service: Thoracic;  Laterality: Left;   AV FISTULA PLACEMENT Left 02/15/2020   Procedure: LEFT ARM ARTERIOVENOUS (AV)  FISTULA CREATION;  Surgeon: Sheree Penne Bruckner, MD;  Location: [facility] OR;  Service: Vascular;  Laterality: Left;   CATARACT EXTRACTION Bilateral    CATARACT EXTRACTION W/ INTRAOCULAR LENS  IMPLANT, BILATERAL     COLONOSCOPY  06/24/2011   Procedure: COLONOSCOPY;  Surgeon: Margo CHRISTELLA Haddock, MD;  Location: [facility];  Service: Endoscopy;  Laterality: N/A;  8:30 AM   EYE SURGERY     GAS/FLUID EXCHANGE Left 03/31/2019   Procedure: Gas/Fluid Exchange;  Surgeon: [name], MD;  Location: [facility] OR;  Service: Ophthalmology;  Laterality: Left;   INJECTION OF SILICONE OIL  02/24/2019   Procedure: Injection Of Silicone Oil;  Surgeon: [name], MD;  Location: [facility] OR;  Service: Ophthalmology;;   INSERTION OF DIALYSIS CATHETER Right 12/05/2019   Procedure: INSERTION OF DIALYSIS CATHETER RIGHT SUBCLAVIAN;  Surgeon: Kallie Manuelita BROCKS, MD;  Location: [facility] ORS;  Service: General;  Laterality: Right;   INSERTION OF DIALYSIS CATHETER Right 12/07/2019   Procedure: Minor Dialysis catheter in place- need to reposition/ adjust catheter to help with flows;  Surgeon: Kallie Manuelita BROCKS, MD;  Location: [facility];  Service: General;  Laterality: Right;  procedure room case with 1% lidocaine , full sterile drape,  towels, full gown, large chloraprep, 4-0 Monocryl and dermabond    INSERTION OF DIALYSIS CATHETER Right 12/08/2019   Procedure: INSERTION OF DIALYSIS CATHETER EXCHANGE;  Surgeon: Kallie Manuelita BROCKS, MD;  Location: [facility];  Service: General;  Laterality: Right;   IR ANGIOGRAM EXTREMITY RIGHT  03/15/2021   IR RADIOLOGIST EVAL & MGMT  02/07/2021   IR RADIOLOGIST EVAL & MGMT  05/09/2021   IR US  GUIDE VASC ACCESS RIGHT  03/15/2021   MEMBRANE PEEL Right 02/24/2019   Procedure: Ottie Booty;  Surgeon: [name], MD;  Location: [facility] OR;  Service: Ophthalmology;  Laterality: Right;   PARS PLANA VITRECTOMY Left 03/31/2019   Procedure: PARS PLANA VITRECTOMY WITH 25 GAUGE WITH MEMBRANE PEEL;  Surgeon: [name], MD;   Location: [facility] OR;  Service: Ophthalmology;  Laterality: Left;   PHOTOCOAGULATION WITH LASER Right 02/24/2019   Procedure: Photocoagulation With Laser;  Surgeon: [name], MD;  Location: [facility] OR;  Service: Ophthalmology;  Laterality: Right;   PHOTOCOAGULATION WITH LASER Left 03/31/2019   Procedure: Photocoagulation With Laser;  Surgeon: [name], MD;  Location: [facility] OR;  Service: Ophthalmology;  Laterality: Left;   RETINAL DETACHMENT SURGERY Left 03/31/2019   TRD Repair - Dr. Rogue Valdemar   SILICON OIL REMOVAL Right 03/31/2019   Procedure: Silicon Oil Removal;  Surgeon: [name], MD;  Location: [facility] OR;  Service: Ophthalmology;  Laterality: Right;   STERNAL WOUND DEBRIDEMENT Left 08/27/2018   Procedure: Incision and DEBRIDEMENT Left Chest, Neck and Mediastinum;  Surgeon: Lucas Dorise POUR, MD;  Location: [facility] OR;  Service: Thoracic;  Laterality: Left;   STERNAL WOUND DEBRIDEMENT Left 08/30/2018   Procedure: WOUND VAC CHANGE, LEFT CHEST AND NECK, POSSIBLE DEBRIDEMENT;  Surgeon: Lucas Dorise POUR, MD;  Location: [facility] OR;  Service: Thoracic;  Laterality: Left;   VENOUS ANGIOPLASTY  06/02/2024   Procedure: VENOUS ANGIOPLASTY;  Surgeon: Sheree Penne Bruckner, MD;  Location: [facility];  Service: Cardiovascular;;  cephalic arch   FAMILY HISTORY Family History  Problem Relation Age of Onset   Hypertension Mother    Colon cancer Neg Hx    SOCIAL HISTORY Social History   Tobacco Use   Smoking status: Never   Smokeless tobacco: Never  Vaping Use   Vaping status: Never Used  Substance Use Topics   Alcohol use: No   Drug use: No       OPHTHALMIC EXAM:  Not recorded    IMAGING AND PROCEDURES  Imaging and Procedures for @TODAY @          ASSESSMENT/PLAN:  No diagnosis found.  1-5. Proliferative diabetic retinopathy with DME, TRD, and vitreous hemorrhage OU (OD > OS)  - last A1c: 8.3 on 12.10.24  - lost to f/u from Jan 2021 to Oct 2021 due to loss of insurance coverage  - pt  with complex medical history with hospitalization in Jan-Feb 2020 for bacteremia and abscess  - history of poor glycemic control for years  - s/p IVA OD #1 (06.20.20), #2 (07.01.20), #3 (09.11.20), #4 (10.09.20), #5 (11.06.20), #6 (11.24.21), #7 (12.22.21), #8 (01.19.22)  - s/p IVA OS #1 (06.20.20), #2 (07.01.20), #3 (08.13.20), #4 (09.11.20)  #5 (11.06.20), #6 (11.24.21), #7 (12.22.21), #8 (01.19.22)  =========================================== - s/p IVE OU #1 (12.04.20) - sample; #2 (01.06.21), #3 (02.18.22), #4 (03.18.22), #5 (04.18.22), #6 (05.25.22), #7 (07.01.22), #8 (07.29.22), #9 (09.02.22), #10 (09.30.22), #11 (11.04.22), #12 (12.09.23), #13 (01.13.23), #14 (03.02.23), #15 (04.11.23), #16 (06.01.23), #17 (07.11.23), #18 (09.12.23), #19 (11.14.23) - s/p IVE OD #20 (03.12.24), #21 (04.23.24), #22 (06.04.24), #23 (07.16.24), #24 (08.30.24), #25 (10.15.24), #26 (11.27.24), #27 (02.11.25), #28 (03.18.25), #29 (04.29.25), #30 (06.03.25), #31 (07.08.25), #32 (08.12.25), #33 (09.16.25), #34 (10.28.25) ======================= - s/p IVV OS #1 (03.12.24), #2 (04.23.24), #3 (06.04.24), #4 (07.16.24), #5 (08.30.24), #6 (10.15.24), #7 (11.27.24), #8 (02.11.25), #9 (03.18.25), #10 (04.29.25), #11 (06.03.25) , #12 (07.08.25), #13 (08.12.25), #14 (09.16.25), #15 (10.28.25) ========================  - s/p STK OS #1 (10.09.20)  - s/p PRP OS (06.10.20)  - s/p PPV/PFC/EL/FAX/silicone oil OD, 07.16.20  - s/p subconj silicon oil removal OD 8.20.20  - s/p PPV/EL/FAX/silicone oil OS, 08.20.20  - BCVA 20/150 OU -- stable             - OCT shows OD: persistent central cystic changes; OS: persistent central cystic changes -- slightly improved at 6 weeks  - recommend IVE OD #35 and IVV OS #16 (12.09.25) today with f/u ext to 6-7 weeks  - RBA of procedure discussed, questions answered  - IVV informed consent obtained and signed, 01.02.25 (OU)  - IVE informed consent obtained and singed, 01.02.25  - see procedure  note             - [place]  benefits investigation begun 01.19.22 -- approved for 2025  - cont topical PF and Prolensa  QID OU for possible CME component  - f/u 6-7 weeks -- DFE/OCT, possible injection(s)  6,7. Hypertensive retinopathy OU  - discussed importance of tight BP control  - continue to monitor  8. Pseudophakia OU  - s/p CE with IOL [place])  Ophthalmic Meds Ordered this visit:  No orders of the defined types were placed in this encounter.    No follow-ups on file.  There are no Patient  Instructions on file for this visit.  This document serves as a record of services personally performed by Redell JUDITHANN Hans, MD, PhD. It was created on their behalf by Wanda GEANNIE Keens, COT an ophthalmic technician. The creation of this record is the provider's dictation and/or activities during the visit.    Electronically signed by:  Wanda GEANNIE Keens, COT  07/05/24 12:30 PM   Redell JUDITHANN Hans, M.D., Ph.D. Diseases & Surgery of the Retina and Vitreous [facility]     Abbreviations: M myopia (nearsighted); A astigmatism; H hyperopia (farsighted); P presbyopia; Mrx spectacle prescription;  CTL contact lenses; OD right eye; OS left eye; OU both eyes  XT exotropia; ET esotropia; PEK punctate epithelial keratitis; PEE punctate epithelial erosions; DES dry eye syndrome; MGD meibomian gland dysfunction; ATs artificial tears; PFAT's preservative free artificial tears; NSC nuclear sclerotic cataract; PSC posterior subcapsular cataract; ERM epi-retinal membrane; PVD posterior vitreous detachment; RD retinal detachment; DM diabetes mellitus; DR diabetic retinopathy; NPDR non-proliferative diabetic retinopathy; PDR proliferative diabetic retinopathy; CSME clinically significant macular edema; DME diabetic macular edema; dbh dot blot hemorrhages; CWS cotton wool spot; POAG primary open angle glaucoma; C/D cup-to-disc ratio; HVF humphrey visual field; GVF goldmann visual field;  OCT optical coherence tomography; IOP intraocular pressure; BRVO Branch retinal vein occlusion; CRVO central retinal vein occlusion; CRAO central retinal artery occlusion; BRAO branch retinal artery occlusion; RT retinal tear; SB scleral buckle; PPV pars plana vitrectomy; VH Vitreous hemorrhage; PRP panretinal laser photocoagulation; IVK intravitreal kenalog ; VMT vitreomacular traction; MH Macular hole;  NVD neovascularization of the disc; NVE neovascularization elsewhere; AREDS age related eye disease study; ARMD age related macular degeneration; POAG primary open angle glaucoma; EBMD epithelial/anterior basement membrane dystrophy; ACIOL anterior chamber intraocular lens; IOL intraocular lens; PCIOL posterior chamber intraocular lens; Phaco/IOL phacoemulsification with intraocular lens placement; PRK photorefractive keratectomy; LASIK laser assisted in situ keratomileusis; HTN hypertension; DM diabetes mellitus; COPD chronic obstructive pulmonary disease

## 2024-07-17 ENCOUNTER — Ambulatory Visit
Admission: EM | Admit: 2024-07-17 | Discharge: 2024-07-17 | Disposition: A | Discharge status: Home / Self Care | Attending: Family Medicine | Admitting: Family Medicine

## 2024-07-17 DIAGNOSIS — R112 Nausea with vomiting, unspecified: Secondary | ICD-10-CM | POA: Diagnosis not present

## 2024-07-17 DIAGNOSIS — J069 Acute upper respiratory infection, unspecified: Secondary | ICD-10-CM | POA: Diagnosis not present

## 2024-07-17 DIAGNOSIS — R197 Diarrhea, unspecified: Secondary | ICD-10-CM | POA: Diagnosis not present

## 2024-07-17 MED ORDER — BENZONATATE 200 MG PO CAPS: 200.0000 mg | ORAL_CAPSULE | Freq: Three times a day (TID) | ORAL | 0 refills | Status: AC | PRN

## 2024-07-17 NOTE — Discharge Instructions (Signed)
 I suspect a viral illness to be causing your symptoms.  Your vital signs and exam are very reassuring today.  I have prescribed a cough medication and you may use over-the-counter remedies in addition as discussed.  Follow-up for worsening or unresolving symptoms.

## 2024-07-17 NOTE — ED Triage Notes (Signed)
 Pt reports he has diarrhea x 4 days

## 2024-07-17 NOTE — ED Provider Notes (Signed)
 [facility]    CSN: [id number] Arrival date & time: 07/17/24  1036      History   Chief Complaint No chief complaint on file.   HPI Randy Oneal is a 58 y.o. male.   Patient presenting today with 4-day history of nausea, vomiting, diarrhea initially but not resolved, mild cough, nasal congestion.  Denies fever, chills, body aches, chest pain, shortness of breath, abdominal pain, vomiting, diarrhea.  So far trying over-the-counter cold and congestion medication and Zyrtec with mild relief.  States symptoms seem to be improving.  Past medical history significant for type 2 diabetes, end-stage renal disease on dialysis with last dialysis Friday.    Past Medical History:  Diagnosis Date   Anemia    Cataract 07/02/2020   Diabetes mellitus    Type 2    Diabetic retinopathy of both eyes (HCC) 01/31/2019   Dr Valdemar 01/2019   ESRD (end stage renal disease) (HCC)    [place] Frenis[place]   Herpes simplex virus (HSV) infection    OU   Hypertension    Hypertensive retinopathy    OU   Retinal detachment    OS   Sepsis due to Streptococcus, group B (HCC) 10/05/2018    Patient Active Problem List   Diagnosis Date Noted   Diabetic foot infection (HCC) 06/12/2023   Diabetic ulcer of right midfoot associated with diabetes mellitus due to underlying condition, with fat layer exposed (HCC) 04/17/2022   COVID-19 10/23/2021   Other fluid overload 07/30/2021   Nausea 05/22/2021   Peripheral neuropathy 12/19/2020   Type 2 diabetes mellitus with retinopathy, with long-term current use of insulin  (HCC) 07/12/2020   Type 2 diabetes mellitus with chronic kidney disease on chronic dialysis, with long-term current use of insulin  (HCC) 07/12/2020   Type 2 diabetes mellitus with diabetic neuropathy, with long-term current use of insulin  (HCC) 07/12/2020   Bilateral retinal detachment 07/02/2020   Blurry vision, bilateral 07/02/2020   Cataract 07/02/2020   ESRD on hemodialysis (HCC)  07/02/2020   Gastroesophageal reflux disease without esophagitis 07/02/2020   History of Bell's palsy 07/02/2020   Hyperlipidemia 07/02/2020   Personal history of septic arthritis 07/02/2020   Hypercalcemia 03/05/2020   Allergy, unspecified, initial encounter 02/29/2020   Anaphylactic shock, unspecified, initial encounter 02/29/2020   Hypothyroidism, unspecified 12/10/2019   Coagulation defect, unspecified 12/09/2019   Diarrhea, unspecified 12/09/2019   Dyspnea, unspecified 12/09/2019   Iron deficiency anemia, unspecified 12/09/2019   Pain, unspecified 12/09/2019   Pruritus, unspecified 12/09/2019   Secondary hyperparathyroidism of renal origin 12/09/2019   Displacement of vascular dialysis catheter    Acute on chronic renal failure 12/01/2019   End stage kidney disease (HCC)    Skin callus 08/03/2019   Pain due to onychomycosis of toenails of both feet 04/20/2019   Posterior tibial tendon dysfunction (PTTD) of both lower extremities 04/20/2019   Diabetic neuropathy (HCC) 04/20/2019   Diabetic retinopathy of both eyes (HCC) 01/31/2019   Type 2 diabetes mellitus with hyperglycemia, with long-term current use of insulin  (HCC) 10/20/2018   Sepsis due to Streptococcus, group B (HCC) 10/05/2018   Dietary counseling and surveillance 09/15/2018   Non compliance with medical treatment 09/15/2018   Open wound of right great toe    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/27/2018   Cellulitis of great toe, right    Diabetic hyperosmolar non-ketotic state (HCC) 08/17/2018   Hyperglycemia 08/17/2018   AKI (acute kidney injury) 08/16/2018   Hyponatremia 08/16/2018   Hyperlipidemia associated with type  2 diabetes mellitus (HCC) 03/04/2017   Erectile dysfunction 03/04/2017   Personal history of noncompliance with medical treatment, presenting hazards to health 11/22/2015   Essential hypertension 01/30/2014   Loss of weight 01/30/2014   Type 2 diabetes mellitus not at goal [facility]) 03/30/2013    Past  Surgical History:  Procedure Laterality Date   25 GAUGE PARS PLANA VITRECTOMY WITH 20 GAUGE MVR PORT FOR MACULAR HOLE Right 02/24/2019   Procedure: 25 GAUGE PARS PLANA VITRECTOMY WITH 20 GAUGE MVR PORT FOR MACULAR HOLE;  Surgeon: [name], MD;  Location: [facility] OR;  Service: Ophthalmology;  Laterality: Right;   A/V FISTULAGRAM Left 06/02/2024   Procedure: A/V Fistulagram;  Surgeon: Sheree Penne Bruckner, MD;  Location: [facility] LAB;  Service: Cardiovascular;  Laterality: Left;   APPLICATION OF WOUND VAC Left 08/27/2018   Procedure: APPLICATION OF WOUND VAC, left neck;  Surgeon: Lucas Dorise POUR, MD;  Location: [facility] OR;  Service: Thoracic;  Laterality: Left;   APPLICATION OF WOUND VAC Left 08/30/2018   Procedure: APPLICATION OF WOUND VAC;  Surgeon: Lucas Dorise POUR, MD;  Location: [facility] OR;  Service: Thoracic;  Laterality: Left;   AV FISTULA PLACEMENT Left 02/15/2020   Procedure: LEFT ARM ARTERIOVENOUS (AV) FISTULA CREATION;  Surgeon: Sheree Penne Bruckner, MD;  Location: [facility] OR;  Service: Vascular;  Laterality: Left;   CATARACT EXTRACTION Bilateral    CATARACT EXTRACTION W/ INTRAOCULAR LENS  IMPLANT, BILATERAL     COLONOSCOPY  06/24/2011   Procedure: COLONOSCOPY;  Surgeon: Margo CHRISTELLA Haddock, MD;  Location: [facility]ITE;  Service: Endoscopy;  Laterality: N/A;  8:30 AM   EYE SURGERY     GAS/FLUID EXCHANGE Left 03/31/2019   Procedure: Gas/Fluid Exchange;  Surgeon: [name], MD;  Location: [facility] OR;  Service: Ophthalmology;  Laterality: Left;   INJECTION OF SILICONE OIL  02/24/2019   Procedure: Injection Of Silicone Oil;  Surgeon: [name], MD;  Location: [facility] OR;  Service: Ophthalmology;;   INSERTION OF DIALYSIS CATHETER Right 12/05/2019   Procedure: INSERTION OF DIALYSIS CATHETER RIGHT SUBCLAVIAN;  Surgeon: Kallie Manuelita BROCKS, MD;  Location: [facility] ORS;  Service: General;  Laterality: Right;   INSERTION OF DIALYSIS CATHETER Right 12/07/2019   Procedure: Minor Dialysis catheter in place- need to  reposition/ adjust catheter to help with flows;  Surgeon: Kallie Manuelita BROCKS, MD;  Location: [facility] ORS;  Service: General;  Laterality: Right;  procedure room case with 1% lidocaine , full sterile drape,  towels, full gown, large chloraprep, 4-0 Monocryl and dermabond    INSERTION OF DIALYSIS CATHETER Right 12/08/2019   Procedure: INSERTION OF DIALYSIS CATHETER EXCHANGE;  Surgeon: Kallie Manuelita BROCKS, MD;  Location: [facility] ORS;  Service: General;  Laterality: Right;   IR ANGIOGRAM EXTREMITY RIGHT  03/15/2021   IR RADIOLOGIST EVAL & MGMT  02/07/2021   IR RADIOLOGIST EVAL & MGMT  05/09/2021   IR US  GUIDE VASC ACCESS RIGHT  03/15/2021   MEMBRANE PEEL Right 02/24/2019   Procedure: Ottie Booty;  Surgeon: [name], MD;  Location: [facility] OR;  Service: Ophthalmology;  Laterality: Right;   PARS PLANA VITRECTOMY Left 03/31/2019   Procedure: PARS PLANA VITRECTOMY WITH 25 GAUGE WITH MEMBRANE PEEL;  Surgeon: [name], MD;  Location: [facility] OR;  Service: Ophthalmology;  Laterality: Left;   PHOTOCOAGULATION WITH LASER Right 02/24/2019   Procedure: Photocoagulation With Laser;  Surgeon: [name], MD;  Location: [facility] OR;  Service: Ophthalmology;  Laterality: Right;   PHOTOCOAGULATION WITH LASER Left 03/31/2019   Procedure: Photocoagulation With Laser;  Surgeon: [name], MD;  Location: [facility] OR;  Service: Ophthalmology;  Laterality: Left;   RETINAL DETACHMENT SURGERY Left 03/31/2019   TRD Repair - Dr. Rogue Valdemar   SILICON OIL REMOVAL Right 03/31/2019   Procedure: Silicon Oil Removal;  Surgeon: [name], MD;  Location: [facility] OR;  Service: Ophthalmology;  Laterality: Right;   STERNAL WOUND DEBRIDEMENT Left 08/27/2018   Procedure: Incision and DEBRIDEMENT Left Chest, Neck and Mediastinum;  Surgeon: Lucas Dorise POUR, MD;  Location: [facility] OR;  Service: Thoracic;  Laterality: Left;   STERNAL WOUND DEBRIDEMENT Left 08/30/2018   Procedure: WOUND VAC CHANGE, LEFT CHEST AND NECK, POSSIBLE DEBRIDEMENT;  Surgeon: Lucas Dorise POUR, MD;   Location: [facility] OR;  Service: Thoracic;  Laterality: Left;   VENOUS ANGIOPLASTY  06/02/2024   Procedure: VENOUS ANGIOPLASTY;  Surgeon: Sheree Penne Bruckner, MD;  Location: [facility] PV LAB;  Service: Cardiovascular;;  cephalic arch       Home Medications    Prior to Admission medications   Medication Sig Start Date End Date Taking? Authorizing Provider  benzonatate  (TESSALON ) 200 MG capsule Take 1 capsule (200 mg total) by mouth 3 (three) times daily as needed for cough. 07/17/24  Yes Stuart Vernell Norris, PA-C  acetaminophen  (TYLENOL ) 500 MG tablet Take 1,000 mg by mouth every 6 (six) hours as needed for moderate pain or headache.    [provider]  amoxicillin -clavulanate (AUGMENTIN ) 500-125 MG tablet Take 1 tablet by mouth 2 (two) times daily. Patient not taking: Reported on 05/24/2024 06/15/23   Vann, Jessica U, DO  Bromfenac  Sodium (PROLENSA ) 0.07 % SOLN Place 1 drop into both eyes 4 (four) times daily. 05/26/23   [name], MD  Bromfenac  Sodium 0.07 % SOLN Place 1 drop into both eyes 4 (four) times daily. 09/22/23   [name], MD  Bromfenac  Sodium 0.07 % SOLN Place 1 drop into both eyes 4 (four) times daily. 09/28/23   [name], MD  cinacalcet  (SENSIPAR ) 60 MG tablet Take 60 mg by mouth every Monday, Wednesday, and Friday.    [provider]  doxycycline  (VIBRA -TABS) 100 MG tablet Take 1 tablet (100 mg total) by mouth every 12 (twelve) hours. 06/15/23   Vann, Jessica U, DO  insulin  aspart (NOVOLOG  FLEXPEN) 100 UNIT/ML FlexPen Inject 10-14 Units into the skin 3 (three) times daily with meals. 05/24/24   Shamleffer, Ibtehal Jaralla, MD  insulin  degludec (TRESIBA  FLEXTOUCH) 100 UNIT/ML FlexTouch Pen Inject 20 Units into the skin at bedtime. 05/24/24   Shamleffer, Ibtehal Jaralla, MD  Insulin  Pen Needle 32G X 4 MM MISC 1 Device by Does not apply route in the morning, at noon, in the evening, and at bedtime. 05/24/24   Shamleffer, Ibtehal Jaralla, MD  ketorolac   (ACULAR ) 0.5 % ophthalmic solution Place 1 drop into both eyes 4 (four) times daily. 01/12/24 01/11/25  [name], MD  lidocaine  (XYLOCAINE ) 2 % jelly Apply topically. 07/02/23   [provider]  prednisoLONE  acetate (PRED FORTE ) 1 % ophthalmic suspension PLACE (1) DROP INTO BOTH EYES FOUR TIMES DAILY 01/12/24   [name], MD  sucroferric oxyhydroxide (VELPHORO ) 500 MG chewable tablet Chew 1,000 mg by mouth 3 (three) times daily with meals.    [provider]  Wound Cleansers (VASHE CLEANSING) SOLN Apply topically. 07/02/23   [provider]    Family History Family History  Problem Relation Age of Onset   Hypertension Mother    Colon cancer Neg Hx     Social History Social History  Tobacco Use   Smoking status: Never   Smokeless tobacco: Never  Vaping Use   Vaping status: Never Used  Substance Use Topics   Alcohol use: No   Drug use: No     Allergies   Tramadol    Review of Systems Review of Systems PER HPI  Physical Exam Triage Vital Signs ED Triage Vitals [07/17/24 1045]  Encounter Vitals Group     BP (!) 153/88     Girls Systolic BP Percentile      Girls Diastolic BP Percentile      Boys Systolic BP Percentile      Boys Diastolic BP Percentile      Pulse Rate 96     Resp 20     Temp 99.1 F (37.3 C)     Temp Source Oral     SpO2 97 %     Weight      Height      Head Circumference      Peak Flow      Pain Score 0     Pain Loc      Pain Education      Exclude from Growth Chart    No data found.  Updated Vital Signs BP (!) 153/88 (BP Location: Right Arm)   Pulse 96   Temp 99.1 F (37.3 C) (Oral)   Resp 20   SpO2 97%   Visual Acuity Right Eye Distance:   Left Eye Distance:   Bilateral Distance:    Right Eye Near:   Left Eye Near:    Bilateral Near:     Physical Exam Vitals and nursing note reviewed.  Constitutional:      Appearance: Normal appearance.  HENT:     Head: Atraumatic.     Nose: Nose normal.      Mouth/Throat:     Mouth: Mucous membranes are moist.     Pharynx: Oropharynx is clear. No posterior oropharyngeal erythema.  Eyes:     Extraocular Movements: Extraocular movements intact.     Conjunctiva/sclera: Conjunctivae normal.  Cardiovascular:     Rate and Rhythm: Normal rate and regular rhythm.  Pulmonary:     Effort: Pulmonary effort is normal.     Breath sounds: Normal breath sounds.  Abdominal:     General: Bowel sounds are normal. There is no distension.     Palpations: Abdomen is soft.     Tenderness: There is no abdominal tenderness. There is no right CVA tenderness, left CVA tenderness or guarding.  Musculoskeletal:        General: Normal range of motion.     Cervical back: Normal range of motion and neck supple.  Skin:    General: Skin is warm and dry.  Neurological:     General: No focal deficit present.     Mental Status: He is oriented to person, place, and time.  Psychiatric:        Mood and Affect: Mood normal.        Thought Content: Thought content normal.        Judgment: Judgment normal.      UC Treatments / Results  Labs (all labs ordered are listed, but only abnormal results are displayed) Labs Reviewed - No data to display  EKG   Radiology No results found.  Procedures Procedures (including critical care time)  Medications Ordered in UC Medications - No data to display  Initial Impression / Assessment and Plan / UC Course  I have reviewed the triage  vital signs and the nursing notes.  Pertinent labs & imaging results that were available during my care of the patient were reviewed by me and considered in my medical decision making (see chart for details).     Vital signs and exam overall very reassuring today.  Suspect viral cause of symptoms.  Symptoms per patient are resolving, discussed Tessalon  Perles, supportive over-the-counter medications and home care.  Return for worsening or unresolving symptoms.  Final Clinical  Impressions(s) / UC Diagnoses   Final diagnoses:  Viral URI with cough  Nausea vomiting and diarrhea     Discharge Instructions      I suspect a viral illness to be causing your symptoms.  Your vital signs and exam are very reassuring today.  I have prescribed a cough medication and you may use over-the-counter remedies in addition as discussed.  Follow-up for worsening or unresolving symptoms.    ED Prescriptions     Medication Sig Dispense Auth. Provider   benzonatate  (TESSALON ) 200 MG capsule Take 1 capsule (200 mg total) by mouth 3 (three) times daily as needed for cough. 20 capsule Stuart Vernell Norris, [place]      PDMP not reviewed this encounter.   Stuart Vernell Norris, [place] 07/17/24 1202

## 2024-07-19 ENCOUNTER — Encounter (INDEPENDENT_AMBULATORY_CARE_PROVIDER_SITE_OTHER): Admitting: Ophthalmology

## 2024-07-19 DIAGNOSIS — I10 Essential (primary) hypertension: Secondary | ICD-10-CM

## 2024-07-19 DIAGNOSIS — H35033 Hypertensive retinopathy, bilateral: Secondary | ICD-10-CM

## 2024-07-19 DIAGNOSIS — E113513 Type 2 diabetes mellitus with proliferative diabetic retinopathy with macular edema, bilateral: Secondary | ICD-10-CM

## 2024-07-19 DIAGNOSIS — H3343 Traction detachment of retina, bilateral: Secondary | ICD-10-CM

## 2024-07-19 DIAGNOSIS — Z961 Presence of intraocular lens: Secondary | ICD-10-CM

## 2024-07-19 DIAGNOSIS — Z7985 Long-term (current) use of injectable non-insulin antidiabetic drugs: Secondary | ICD-10-CM

## 2024-07-19 DIAGNOSIS — Z7984 Long term (current) use of oral hypoglycemic drugs: Secondary | ICD-10-CM

## 2024-07-19 DIAGNOSIS — H4313 Vitreous hemorrhage, bilateral: Secondary | ICD-10-CM

## 2024-07-21 NOTE — Progress Notes (Signed)
 [facility] - Clinic Note  07/26/2024    CHIEF COMPLAINT Patient presents for No chief complaint on file.  HISTORY OF PRESENT ILLNESS: Randy Oneal is a 58 y.o. male who presents to the clinic today for:  HPI   Pt states the pharmacy changed him from Pred Forte  to Ketorolac  and he has been using them QID OU along with Prolensa  QID OU. Pt had a stomach illness along with some issues with his feet recently. Pt has not noticed any changes with his vision. Pt denies FOL/floaters/pain. Last edited by Elnor Avelina RAMAN, COT on 07/26/2024  2:04 PM.      Patient states   Referring physician: [name], MD [address] [place],  [place] [address]  HISTORICAL INFORMATION:   Selected notes from the MEDICAL RECORD NUMBER Referred by Dr.Mark Roz for concern of decreased vision post cataract sx   CURRENT MEDICATIONS: Current Outpatient Medications (Ophthalmic Drugs)  Medication Sig   Bromfenac  Sodium (PROLENSA ) 0.07 % SOLN Place 1 drop into both eyes 4 (four) times daily.   Bromfenac  Sodium 0.07 % SOLN Place 1 drop into both eyes 4 (four) times daily.   Bromfenac  Sodium 0.07 % SOLN Place 1 drop into both eyes 4 (four) times daily.   ketorolac  (ACULAR ) 0.5 % ophthalmic solution Place 1 drop into both eyes 4 (four) times daily.   prednisoLONE  acetate (PRED FORTE ) 1 % ophthalmic suspension PLACE (1) DROP INTO BOTH EYES FOUR TIMES DAILY   No current facility-administered medications for this visit. (Ophthalmic Drugs)   Current Outpatient Medications (Other)  Medication Sig   acetaminophen  (TYLENOL ) 500 MG tablet Take 1,000 mg by mouth every 6 (six) hours as needed for moderate pain or headache.   amoxicillin -clavulanate (AUGMENTIN ) 500-125 MG tablet Take 1 tablet by mouth 2 (two) times daily. (Patient not taking: Reported on 05/24/2024)   benzonatate  (TESSALON ) 200 MG capsule Take 1 capsule (200 mg total) by mouth 3 (three) times daily as needed for  cough.   cinacalcet  (SENSIPAR ) 60 MG tablet Take 60 mg by mouth every Monday, Wednesday, and Friday.   doxycycline  (VIBRA -TABS) 100 MG tablet Take 1 tablet (100 mg total) by mouth every 12 (twelve) hours.   insulin  aspart (NOVOLOG  FLEXPEN) 100 UNIT/ML FlexPen Inject 10-14 Units into the skin 3 (three) times daily with meals.   insulin  degludec (TRESIBA  FLEXTOUCH) 100 UNIT/ML FlexTouch Pen Inject 20 Units into the skin at bedtime.   Insulin  Pen Needle 32G X 4 MM MISC 1 Device by Does not apply route in the morning, at noon, in the evening, and at bedtime.   lidocaine  (XYLOCAINE ) 2 % jelly Apply topically.   sucroferric oxyhydroxide (VELPHORO ) 500 MG chewable tablet Chew 1,000 mg by mouth 3 (three) times daily with meals.   Wound Cleansers (VASHE CLEANSING) SOLN Apply topically.   No current facility-administered medications for this visit. (Other)   REVIEW OF SYSTEMS: ROS   Positive for: Genitourinary, Endocrine, Eyes Negative for: Constitutional, Gastrointestinal, Neurological, Skin, Musculoskeletal, HENT, Cardiovascular, Respiratory, Psychiatric, Allergic/Imm, Heme/Lymph Last edited by Elnor Avelina RAMAN, COT on 07/26/2024  1:50 PM.         ALLERGIES Allergies  Allergen Reactions   Tramadol  Nausea And Vomiting   PAST MEDICAL HISTORY Past Medical History:  Diagnosis Date   Anemia    Cataract 07/02/2020   Diabetes mellitus    Type 2    Diabetic retinopathy of both eyes (HCC) 01/31/2019   Dr Valdemar 01/2019  ESRD (end stage renal disease) (HCC)    [place] Frenisus   Herpes simplex virus (HSV) infection    OU   Hypertension    Hypertensive retinopathy    OU   Retinal detachment    OS   Sepsis due to Streptococcus, group B (HCC) 10/05/2018   Past Surgical History:  Procedure Laterality Date   25 GAUGE PARS PLANA VITRECTOMY WITH 20 GAUGE MVR PORT FOR MACULAR HOLE Right 02/24/2019   Procedure: 25 GAUGE PARS PLANA VITRECTOMY WITH 20 GAUGE MVR PORT FOR MACULAR HOLE;  Surgeon:  [name], MD;  Location: [facility] OR;  Service: Ophthalmology;  Laterality: Right;   A/V FISTULAGRAM Left 06/02/2024   Procedure: A/V Fistulagram;  Surgeon: Sheree Penne Bruckner, MD;  Location: [facility] LAB;  Service: Cardiovascular;  Laterality: Left;   APPLICATION OF WOUND VAC Left 08/27/2018   Procedure: APPLICATION OF WOUND VAC, left neck;  Surgeon: Lucas Dorise POUR, MD;  Location: [facility] OR;  Service: Thoracic;  Laterality: Left;   APPLICATION OF WOUND VAC Left 08/30/2018   Procedure: APPLICATION OF WOUND VAC;  Surgeon: Lucas Dorise POUR, MD;  Location: [facility] OR;  Service: Thoracic;  Laterality: Left;   AV FISTULA PLACEMENT Left 02/15/2020   Procedure: LEFT ARM ARTERIOVENOUS (AV) FISTULA CREATION;  Surgeon: Sheree Penne Bruckner, MD;  Location: [facility] OR;  Service: Vascular;  Laterality: Left;   CATARACT EXTRACTION Bilateral    CATARACT EXTRACTION W/ INTRAOCULAR LENS  IMPLANT, BILATERAL     COLONOSCOPY  06/24/2011   Procedure: COLONOSCOPY;  Surgeon: Margo CHRISTELLA Haddock, MD;  Location: [facility] SUITE;  Service: Endoscopy;  Laterality: N/A;  8:30 AM   EYE SURGERY     GAS/FLUID EXCHANGE Left 03/31/2019   Procedure: Gas/Fluid Exchange;  Surgeon: [name], MD;  Location: [facility] OR;  Service: Ophthalmology;  Laterality: Left;   INJECTION OF SILICONE OIL  02/24/2019   Procedure: Injection Of Silicone Oil;  Surgeon: [name], MD;  Location: [facility] OR;  Service: Ophthalmology;;   INSERTION OF DIALYSIS CATHETER Right 12/05/2019   Procedure: INSERTION OF DIALYSIS CATHETER RIGHT SUBCLAVIAN;  Surgeon: Kallie Manuelita BROCKS, MD;  Location: [facility] ORS;  Service: General;  Laterality: Right;   INSERTION OF DIALYSIS CATHETER Right 12/07/2019   Procedure: Minor Dialysis catheter in place- need to reposition/ adjust catheter to help with flows;  Surgeon: Kallie Manuelita BROCKS, MD;  Location: [facility] ORS;  Service: General;  Laterality: Right;  procedure room case with 1% lidocaine , full sterile drape,  towels, full gown, large chloraprep, 4-0  Monocryl and dermabond    INSERTION OF DIALYSIS CATHETER Right 12/08/2019   Procedure: INSERTION OF DIALYSIS CATHETER EXCHANGE;  Surgeon: Kallie Manuelita BROCKS, MD;  Location: [facility] ORS;  Service: General;  Laterality: Right;   IR ANGIOGRAM EXTREMITY RIGHT  03/15/2021   IR RADIOLOGIST EVAL & MGMT  02/07/2021   IR RADIOLOGIST EVAL & MGMT  05/09/2021   IR US  GUIDE VASC ACCESS RIGHT  03/15/2021   MEMBRANE PEEL Right 02/24/2019   Procedure: Ottie Booty;  Surgeon: [name], MD;  Location: [facility] OR;  Service: Ophthalmology;  Laterality: Right;   PARS PLANA VITRECTOMY Left 03/31/2019   Procedure: PARS PLANA VITRECTOMY WITH 25 GAUGE WITH MEMBRANE PEEL;  Surgeon: [name], MD;  Location: [facility] OR;  Service: Ophthalmology;  Laterality: Left;   PHOTOCOAGULATION WITH LASER Right 02/24/2019   Procedure: Photocoagulation With Laser;  Surgeon: [name], MD;  Location: [facility] OR;  Service: Ophthalmology;  Laterality: Right;   PHOTOCOAGULATION WITH LASER Left 03/31/2019  Procedure: Photocoagulation With Laser;  Surgeon: [name], MD;  Location: [facility] OR;  Service: Ophthalmology;  Laterality: Left;   RETINAL DETACHMENT SURGERY Left 03/31/2019   TRD Repair - Dr. Rogue Valdemar   SILICON OIL REMOVAL Right 03/31/2019   Procedure: Silicon Oil Removal;  Surgeon: [name], MD;  Location: [facility] OR;  Service: Ophthalmology;  Laterality: Right;   STERNAL WOUND DEBRIDEMENT Left 08/27/2018   Procedure: Incision and DEBRIDEMENT Left Chest, Neck and Mediastinum;  Surgeon: Lucas Dorise POUR, MD;  Location: [facility] OR;  Service: Thoracic;  Laterality: Left;   STERNAL WOUND DEBRIDEMENT Left 08/30/2018   Procedure: WOUND VAC CHANGE, LEFT CHEST AND NECK, POSSIBLE DEBRIDEMENT;  Surgeon: Lucas Dorise POUR, MD;  Location: [facility] OR;  Service: Thoracic;  Laterality: Left;   VENOUS ANGIOPLASTY  06/02/2024   Procedure: VENOUS ANGIOPLASTY;  Surgeon: Sheree Penne Bruckner, MD;  Location: [facility] LAB;  Service: Cardiovascular;;  cephalic arch   FAMILY  HISTORY Family History  Problem Relation Age of Onset   Hypertension Mother    Colon cancer Neg Hx    SOCIAL HISTORY Social History   Tobacco Use   Smoking status: Never   Smokeless tobacco: Never  Vaping Use   Vaping status: Never Used  Substance Use Topics   Alcohol use: No   Drug use: No       OPHTHALMIC EXAM:  Base Eye Exam     Visual Acuity (Snellen - Linear)       Right Left   Dist [place] 20/200 -2 20/250 -2   Dist ph [place] 20/150 +1 20/150 -2         Tonometry (Tonopen, 1:58 PM)       Right Left   Pressure 11 13         Pupils       Pupils Dark Light Shape React APD   Right PERRL 4 3 Irregular Minimal None   Left PERRL 4 3 Irregular Minimal None         Visual Fields       Left Right    Full Full         Extraocular Movement       Right Left    Full, Ortho Full, Ortho         Neuro/Psych     Oriented x3: Yes   Mood/Affect: Normal         Dilation     Both eyes: 1.0% Mydriacyl , 2.5% Phenylephrine  @ 1:58 PM           Slit Lamp and Fundus Exam     Slit Lamp Exam       Right Left   Lids/Lashes mild Meibomian gland dysfunction mild Meibomian gland dysfunction   Conjunctiva/Sclera subconj silicon oil bubbles, Chemosis, conj Cyst White and quiet   Cornea Trace PEE, Well healed cataract wound, arcus, Debris in tear film Trace PEE, Well healed cataract wound, arcus, Debris in tear film   Anterior Chamber Deep and quiet, no cell or flare Deep and quiet, no cell or flare   Iris slightly irregular dilation, posterior synchiae from 3:00-1200 round, focal Posterior synechiae at 1030   Lens PC IOL in good position, pigment deposition, 1+ PCO, mild pigment deposition on optic PC IOL in good position, mild pigment deposition   Anterior Vitreous Post vit, silicon oil bubble ~85% post vitrectomy, good silicone oil fill         Fundus Exam       Right Left  Disc 2+pallor, sharp rim, mild fibrosis along SN rim +pallor, sharp rim, fine  NVD -- regressed, mild fibrosis   C/D Ratio 0.2 0.2   Macula attached under oil, scattered MA/IRH -- improved, nasal and central thickening / edema -- persistent attached under oil, persistent fibrosis, persistent edema--slightly improved, focal bands of PRF with traction emanating from ST macula -- stable, focal DBH temporal macula -- stably improved   Vessels Severe attenuation, Tortuous Severe attenuation, Tortuous   Periphery Attached, dense 360 PRP laser, mild scattered fibrosis - persistent, Focal ret hole along distal IT arcades -- good laser surrounding Attached, good 360 PRP laser changes, pre-retinal fibrosis extending to posterior PRP border superior and inferiorly           IMAGING AND PROCEDURES  Imaging and Procedures for @TODAY @  OCT, Retina - OU - Both Eyes       Right Eye Quality was good. Central Foveal Thickness: 611. Progression has been stable. Findings include no SRF, abnormal foveal contour, epiretinal membrane, intraretinal fluid, outer retinal atrophy, preretinal fibrosis (Persistent central thickening/edema).   Left Eye Quality was good. Central Foveal Thickness: 434. Progression has improved. Findings include no SRF, abnormal foveal contour, intraretinal hyper-reflective material, epiretinal membrane, intraretinal fluid, preretinal fibrosis (Persistent central cystic changes -- slightly improved).   Notes *Images captured and stored on drive  Diagnosis / Impression:  +DME under silicon oil OU OD: Persistent central thickening/edema OS: persistent central cystic changes -- slightly improved  Clinical management:  See below  Abbreviations: NFP - Normal foveal profile. CME - cystoid macular edema. PED - pigment epithelial detachment. IRF - intraretinal fluid. SRF - subretinal fluid. EZ - ellipsoid zone. ERM - epiretinal membrane. ORA - outer retinal atrophy. ORT - outer retinal tubulation. SRHM - subretinal hyper-reflective material      Intravitreal  Injection, Pharmacologic Agent - OD - Right Eye       Time Out 07/26/2024. 2:38 PM. Confirmed correct patient, procedure, site, and patient consented.   Anesthesia Topical anesthesia was used. Anesthetic medications included Lidocaine  2%, Proparacaine  0.5%.   Procedure Preparation included 5% betadine to ocular surface, eyelid speculum. A (32g) needle was used.   Injection: 2 mg aflibercept  2 MG/0.05ML   Route: Intravitreal, Site: Right Eye   NDC: [id number], Waste: 0 mL   Post-op Post injection exam found visual acuity of at least counting fingers. The patient tolerated the procedure well. There were no complications. The patient received written and verbal post procedure care education. Post injection medications were not given.      Intravitreal Injection, Pharmacologic Agent - OS - Left Eye       Time Out 07/26/2024. 2:38 PM. Confirmed correct patient, procedure, site, and patient consented.   Anesthesia Topical anesthesia was used. Anesthetic medications included Lidocaine  2%, Proparacaine  0.5%.   Procedure Preparation included 5% betadine to ocular surface, eyelid speculum. A supplied (32g) needle was used.   Injection: 6 mg faricimab -svoa 6 MG/0.05ML   Route: Intravitreal, Site: Left Eye   Post-op Post injection exam found visual acuity of at least counting fingers. The patient tolerated the procedure well. There were no complications. The patient received written and verbal post procedure care education. Post injection medications were not given.             ASSESSMENT/PLAN:    ICD-10-CM   1. Proliferative diabetic retinopathy of both eyes with macular edema associated with type 2 diabetes mellitus (HCC)  E11.3513 OCT, Retina - OU - Both Eyes  Intravitreal Injection, Pharmacologic Agent - OD - Right Eye    Intravitreal Injection, Pharmacologic Agent - OS - Left Eye    2. Long term (current) use of oral hypoglycemic drugs  Z79.84     3. Long-term  (current) use of injectable non-insulin  antidiabetic drugs  Z79.85     4. 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 Vitreous hemorrhage of both eyes (HCC)  H43.13     6. Essential hypertension  I10     7. Hypertensive retinopathy of both eyes  H35.033     8. Pseudophakia of both eyes  Z96.1       1-5. Proliferative diabetic retinopathy with DME, TRD, and vitreous hemorrhage OU (OD > OS)  - last A1c: 8.3 on 10.14.25, 8.3 on 12.10.24  - lost to f/u from Jan 2021 to Oct 2021 due to loss of insurance coverage  - pt with complex medical history with hospitalization in Jan-Feb 2020 for bacteremia and abscess  - history of poor glycemic control for years  - s/p IVA OD #1 (06.20.20), #2 (07.01.20), #3 (09.11.20), #4 (10.09.20), #5 (11.06.20), #6 (11.24.21), #7 (12.22.21), #8 (01.19.22)  - s/p IVA OS #1 (06.20.20), #2 (07.01.20), #3 (08.13.20), #4 (09.11.20)  #5 (11.06.20), #6 (11.24.21), #7 (12.22.21), #8 (01.19.22)  =========================================== - s/p IVE OU #1 (12.04.20) - sample; #2 (01.06.21), #3 (02.18.22), #4 (03.18.22), #5 (04.18.22), #6 (05.25.22), #7 (07.01.22), #8 (07.29.22), #9 (09.02.22), #10 (09.30.22), #11 (11.04.22), #12 (12.09.23), #13 (01.13.23), #14 (03.02.23), #15 (04.11.23), #16 (06.01.23), #17 (07.11.23), #18 (09.12.23), #19 (11.14.23) - s/p IVE OD #20 (03.12.24), #21 (04.23.24), #22 (06.04.24), #23 (07.16.24), #24 (08.30.24), #25 (10.15.24), #26 (11.27.24), #27 (02.11.25), #28 (03.18.25), #29 (04.29.25), #30 (06.03.25), #31 (07.08.25), #32 (08.12.25), #33 (09.16.25), #34 (10.28.25) ======================= - s/p IVV OS #1 (03.12.24), #2 (04.23.24), #3 (06.04.24), #4 (07.16.24), #5 (08.30.24), #6 (10.15.24), #7 (11.27.24), #8 (02.11.25), #9 (03.18.25), #10 (04.29.25), #11 (06.03.25) , #12 (07.08.25), #13 (08.12.25), #14 (09.16.25), #15 (10.28.25) ========================  - s/p STK OS #1 (10.09.20)  - s/p PRP OS (06.10.20)  - s/p  PPV/PFC/EL/FAX/silicone oil OD, 07.16.20  - s/p subconj silicon oil removal OD 8.20.20  - s/p PPV/EL/FAX/silicone oil OS, 08.20.20  - BCVA 20/150 OU -- stable             - OCT shows OD: Persistent central thickening/edema; OS: persistent central cystic changes -- slightly improved at 7 weeks  - recommend IVE OD #35 and IVV OS #16 (10.28.25) today with f/u ext to 7-8weeks  - RBA of procedure discussed, questions answered  - IVV informed consent obtained and signed, 01.02.25 (OU)  - IVE informed consent obtained and singed, 01.02.25  - see procedure note             - [employer]  benefits investigation begun 01.19.22 -- approved for 2025  - cont topical PF and Prolensa  QID OU for possible CME component  - f/u 7-8 weeks -- DFE/OCT, possible injection(s)  6,7. Hypertensive retinopathy OU  - discussed importance of tight BP control  - continue to monitor  8. Pseudophakia OU  - s/p CE with IOL [place])  Ophthalmic Meds Ordered this visit:  No orders of the defined types were placed in this encounter.    Return for 7-8 weeks PDR OU, DFE, OCT.  There are no Patient Instructions on file for this visit.  This document serves as a record of services personally performed by Redell JUDITHANN Hans, MD, PhD. It was created on their behalf by Wanda  GEANNIE Keens, COT an ophthalmic technician. The creation of this record is the provider's dictation and/or activities during the visit.    Electronically signed by:  Wanda GEANNIE Keens, COT  07/26/2024 2:41 PM  This document serves as a record of services personally performed by Redell JUDITHANN Hans, MD, PhD. It was created on their behalf by Almetta Pesa, an ophthalmic technician. The creation of this record is the provider's dictation and/or activities during the visit.    Electronically signed by: Almetta Pesa, OA, 07/26/2024  2:41 PM    Redell JUDITHANN Hans, M.D., Ph.D. Diseases & Surgery of the Retina and Vitreous [facility]    Abbreviations: M myopia (nearsighted); A astigmatism; H hyperopia (farsighted); P presbyopia; Mrx spectacle prescription;  CTL contact lenses; OD right eye; OS left eye; OU both eyes  XT exotropia; ET esotropia; PEK punctate epithelial keratitis; PEE punctate epithelial erosions; DES dry eye syndrome; MGD meibomian gland dysfunction; ATs artificial tears; PFAT's preservative free artificial tears; NSC nuclear sclerotic cataract; PSC posterior subcapsular cataract; ERM epi-retinal membrane; PVD posterior vitreous detachment; RD retinal detachment; DM diabetes mellitus; DR diabetic retinopathy; NPDR non-proliferative diabetic retinopathy; PDR proliferative diabetic retinopathy; CSME clinically significant macular edema; DME diabetic macular edema; dbh dot blot hemorrhages; CWS cotton wool spot; POAG primary open angle glaucoma; C/D cup-to-disc ratio; HVF humphrey visual field; GVF goldmann visual field; OCT optical coherence tomography; IOP intraocular pressure; BRVO Branch retinal vein occlusion; CRVO central retinal vein occlusion; CRAO central retinal artery occlusion; BRAO branch retinal artery occlusion; RT retinal tear; SB scleral buckle; PPV pars plana vitrectomy; VH Vitreous hemorrhage; PRP panretinal laser photocoagulation; IVK intravitreal kenalog ; VMT vitreomacular traction; MH Macular hole;  NVD neovascularization of the disc; NVE neovascularization elsewhere; AREDS age related eye disease study; ARMD age related macular degeneration; POAG primary open angle glaucoma; EBMD epithelial/anterior basement membrane dystrophy; ACIOL anterior chamber intraocular lens; IOL intraocular lens; PCIOL posterior chamber intraocular lens; Phaco/IOL phacoemulsification with intraocular lens placement; PRK photorefractive keratectomy; LASIK laser assisted in situ keratomileusis; HTN hypertension; DM diabetes mellitus; COPD chronic obstructive pulmonary disease

## 2024-07-26 ENCOUNTER — Ambulatory Visit (INDEPENDENT_AMBULATORY_CARE_PROVIDER_SITE_OTHER): Admitting: Ophthalmology

## 2024-07-26 ENCOUNTER — Encounter (INDEPENDENT_AMBULATORY_CARE_PROVIDER_SITE_OTHER): Payer: Self-pay | Admitting: Ophthalmology

## 2024-07-26 DIAGNOSIS — Z961 Presence of intraocular lens: Secondary | ICD-10-CM

## 2024-07-26 DIAGNOSIS — H35033 Hypertensive retinopathy, bilateral: Secondary | ICD-10-CM

## 2024-07-26 DIAGNOSIS — I10 Essential (primary) hypertension: Secondary | ICD-10-CM

## 2024-07-26 DIAGNOSIS — H3343 Traction detachment of retina, bilateral: Secondary | ICD-10-CM | POA: Diagnosis not present

## 2024-07-26 DIAGNOSIS — E113513 Type 2 diabetes mellitus with proliferative diabetic retinopathy with macular edema, bilateral: Secondary | ICD-10-CM | POA: Diagnosis not present

## 2024-07-26 DIAGNOSIS — Z7985 Long-term (current) use of injectable non-insulin antidiabetic drugs: Secondary | ICD-10-CM

## 2024-07-26 DIAGNOSIS — H4313 Vitreous hemorrhage, bilateral: Secondary | ICD-10-CM | POA: Diagnosis not present

## 2024-07-26 DIAGNOSIS — Z7984 Long term (current) use of oral hypoglycemic drugs: Secondary | ICD-10-CM

## 2024-07-26 MED ORDER — FARICIMAB-SVOA 6 MG/0.05ML IZ SOSY
6.0000 mg | PREFILLED_SYRINGE | INTRAVITREAL | Status: AC | PRN
  Administered 2024-07-26: 18:00:00 6 mg via INTRAVITREAL

## 2024-07-26 MED ORDER — AFLIBERCEPT 2MG/0.05ML IZ SOLN FOR KALEIDOSCOPE
2.0000 mg | INTRAVITREAL | Status: AC | PRN
  Administered 2024-07-26: 18:00:00 2 mg via INTRAVITREAL

## 2024-07-27 ENCOUNTER — Ambulatory Visit: Admitting: Family Medicine

## 2024-08-30 NOTE — Progress Notes (Shared)
 "  [facility] - Clinic Note  09/13/2024    CHIEF COMPLAINT Patient presents for No chief complaint on file.  HISTORY OF PRESENT ILLNESS: Randy Oneal is a 59 y.o. male who presents to the clinic today for:   Patient states   Referring physician: [name], MD [address] [place],  [place] [address]  HISTORICAL INFORMATION:   Selected notes from the MEDICAL RECORD NUMBER Referred by Dr.Mark Roz for concern of decreased vision post cataract sx   CURRENT MEDICATIONS: Current Outpatient Medications (Ophthalmic Drugs)  Medication Sig   Bromfenac  Sodium (PROLENSA ) 0.07 % SOLN Place 1 drop into both eyes 4 (four) times daily.   Bromfenac  Sodium 0.07 % SOLN Place 1 drop into both eyes 4 (four) times daily.   Bromfenac  Sodium 0.07 % SOLN Place 1 drop into both eyes 4 (four) times daily.   ketorolac  (ACULAR ) 0.5 % ophthalmic solution Place 1 drop into both eyes 4 (four) times daily.   prednisoLONE  acetate (PRED FORTE ) 1 % ophthalmic suspension PLACE (1) DROP INTO BOTH EYES FOUR TIMES DAILY   No current facility-administered medications for this visit. (Ophthalmic Drugs)   Current Outpatient Medications (Other)  Medication Sig   acetaminophen  (TYLENOL ) 500 MG tablet Take 1,000 mg by mouth every 6 (six) hours as needed for moderate pain or headache.   amoxicillin -clavulanate (AUGMENTIN ) 500-125 MG tablet Take 1 tablet by mouth 2 (two) times daily. (Patient not taking: Reported on 05/24/2024)   benzonatate  (TESSALON ) 200 MG capsule Take 1 capsule (200 mg total) by mouth 3 (three) times daily as needed for cough.   cinacalcet  (SENSIPAR ) 60 MG tablet Take 60 mg by mouth every Monday, Wednesday, and Friday.   doxycycline  (VIBRA -TABS) 100 MG tablet Take 1 tablet (100 mg total) by mouth every 12 (twelve) hours.   insulin  aspart (NOVOLOG  FLEXPEN) 100 UNIT/ML FlexPen Inject 10-14 Units into the skin 3 (three) times daily with meals.   insulin  degludec  (TRESIBA  FLEXTOUCH) 100 UNIT/ML FlexTouch Pen Inject 20 Units into the skin at bedtime.   Insulin  Pen Needle 32G X 4 MM MISC 1 Device by Does not apply route in the morning, at noon, in the evening, and at bedtime.   lidocaine  (XYLOCAINE ) 2 % jelly Apply topically.   sucroferric oxyhydroxide (VELPHORO ) 500 MG chewable tablet Chew 1,000 mg by mouth 3 (three) times daily with meals.   Wound Cleansers (VASHE CLEANSING) SOLN Apply topically.   No current facility-administered medications for this visit. (Other)   REVIEW OF SYSTEMS:   ALLERGIES Allergies  Allergen Reactions   Tramadol  Nausea And Vomiting   PAST MEDICAL HISTORY Past Medical History:  Diagnosis Date   Anemia    Cataract 07/02/2020   Diabetes mellitus    Type 2    Diabetic retinopathy of both eyes (HCC) 01/31/2019   Dr Valdemar 01/2019   ESRD (end stage renal disease) (HCC)    [place] Frenisus   Herpes simplex virus (HSV) infection    OU   Hypertension    Hypertensive retinopathy    OU   Retinal detachment    OS   Sepsis due to Streptococcus, group B (HCC) 10/05/2018   Past Surgical History:  Procedure Laterality Date   25 GAUGE PARS PLANA VITRECTOMY WITH 20 GAUGE MVR PORT FOR MACULAR HOLE Right 02/24/2019   Procedure: 25 GAUGE PARS PLANA VITRECTOMY WITH 20 GAUGE MVR PORT FOR MACULAR HOLE;  Surgeon: [name], MD;  Location: [facility] OR;  Service:  Ophthalmology;  Laterality: Right;   A/V FISTULAGRAM Left 06/02/2024   Procedure: A/V Fistulagram;  Surgeon: Sheree Penne Bruckner, MD;  Location: [facility];  Service: Cardiovascular;  Laterality: Left;   APPLICATION OF WOUND VAC Left 08/27/2018   Procedure: APPLICATION OF WOUND VAC, left neck;  Surgeon: Lucas Dorise POUR, MD;  Location: [facility] OR;  Service: Thoracic;  Laterality: Left;   APPLICATION OF WOUND VAC Left 08/30/2018   Procedure: APPLICATION OF WOUND VAC;  Surgeon: Lucas Dorise POUR, MD;  Location: [facility] OR;  Service: Thoracic;  Laterality: Left;   AV FISTULA PLACEMENT  Left 02/15/2020   Procedure: LEFT ARM ARTERIOVENOUS (AV) FISTULA CREATION;  Surgeon: Sheree Penne Bruckner, MD;  Location: [facility] OR;  Service: Vascular;  Laterality: Left;   CATARACT EXTRACTION Bilateral    CATARACT EXTRACTION W/ INTRAOCULAR LENS  IMPLANT, BILATERAL     COLONOSCOPY  06/24/2011   Procedure: COLONOSCOPY;  Surgeon: Margo CHRISTELLA Haddock, MD;  Location: [facility];  Service: Endoscopy;  Laterality: N/A;  8:30 AM   EYE SURGERY     GAS/FLUID EXCHANGE Left 03/31/2019   Procedure: Gas/Fluid Exchange;  Surgeon: [name], MD;  Location: [facility] OR;  Service: Ophthalmology;  Laterality: Left;   INJECTION OF SILICONE OIL  02/24/2019   Procedure: Injection Of Silicone Oil;  Surgeon: [name], MD;  Location: [facility] OR;  Service: Ophthalmology;;   INSERTION OF DIALYSIS CATHETER Right 12/05/2019   Procedure: INSERTION OF DIALYSIS CATHETER RIGHT SUBCLAVIAN;  Surgeon: Kallie Manuelita BROCKS, MD;  Location: [facility] ORS;  Service: General;  Laterality: Right;   INSERTION OF DIALYSIS CATHETER Right 12/07/2019   Procedure: Minor Dialysis catheter in place- need to reposition/ adjust catheter to help with flows;  Surgeon: Kallie Manuelita BROCKS, MD;  Location: [facility]S;  Service: General;  Laterality: Right;  procedure room case with 1% lidocaine , full sterile drape,  towels, full gown, large chloraprep, 4-0 Monocryl and dermabond    INSERTION OF DIALYSIS CATHETER Right 12/08/2019   Procedure: INSERTION OF DIALYSIS CATHETER EXCHANGE;  Surgeon: Kallie Manuelita BROCKS, MD;  Location: [facility];  Service: General;  Laterality: Right;   IR ANGIOGRAM EXTREMITY RIGHT  03/15/2021   IR RADIOLOGIST EVAL & MGMT  02/07/2021   IR RADIOLOGIST EVAL & MGMT  05/09/2021   IR US  GUIDE VASC ACCESS RIGHT  03/15/2021   MEMBRANE PEEL Right 02/24/2019   Procedure: Ottie Booty;  Surgeon: [name], MD;  Location: [facility] OR;  Service: Ophthalmology;  Laterality: Right;   PARS PLANA VITRECTOMY Left 03/31/2019   Procedure: PARS PLANA VITRECTOMY WITH 25 GAUGE  WITH MEMBRANE PEEL;  Surgeon: [name], MD;  Location[facility] OR;  Service: Ophthalmology;  Laterality: Left;   PHOTOCOAGULATION WITH LASER Right 02/24/2019   Procedure: Photocoagulation With Laser;  Surgeon: [name], MD;  Location: [facility] OR;  Service: Ophthalmology;  Laterality: Right;   PHOTOCOAGULATION WITH LASER Left 03/31/2019   Procedure: Photocoagulation With Laser;  Surgeon: [name], MD;  Location: [facility] OR;  Service: Ophthalmology;  Laterality: Left;   RETINAL DETACHMENT SURGERY Left 03/31/2019   TRD Repair - Dr. Rogue Valdemar   SILICON OIL REMOVAL Right 03/31/2019   Procedure: Silicon Oil Removal;  Surgeon: [name], MD;  Location: [facility] OR;  Service: Ophthalmology;  Laterality: Right;   STERNAL WOUND DEBRIDEMENT Left 08/27/2018   Procedure: Incision and DEBRIDEMENT Left Chest, Neck and Mediastinum;  Surgeon: Lucas Dorise POUR, MD;  Location: [facility] OR;  Service: Thoracic;  Laterality: Left;   STERNAL WOUND DEBRIDEMENT Left 08/30/2018  Procedure: WOUND VAC CHANGE, LEFT CHEST AND NECK, POSSIBLE DEBRIDEMENT;  Surgeon: Lucas Dorise POUR, MD;  Location: [facility] OR;  Service: Thoracic;  Laterality: Left;   VENOUS ANGIOPLASTY  06/02/2024   Procedure: VENOUS ANGIOPLASTY;  Surgeon: Sheree Penne Bruckner, MD;  Location: [facility];  Service: Cardiovascular;;  cephalic arch   FAMILY HISTORY Family History  Problem Relation Age of Onset   Hypertension Mother    Colon cancer Neg Hx    SOCIAL HISTORY Social History   Tobacco Use   Smoking status: Never   Smokeless tobacco: Never  Vaping Use   Vaping status: Never Used  Substance Use Topics   Alcohol use: No   Drug use: No       OPHTHALMIC EXAM:  Not recorded    IMAGING AND PROCEDURES  Imaging and Procedures for @TODAY @          ASSESSMENT/PLAN:  No diagnosis found.  1-5. Proliferative diabetic retinopathy with DME, TRD, and vitreous hemorrhage OU (OD > OS)  - last A1c: 8.3 on 10.14.25, 8.3 on 12.10.24  - lost to f/u  from Jan 2021 to Oct 2021 due to loss of insurance coverage  - pt with complex medical history with hospitalization in Jan-Feb 2020 for bacteremia and abscess  - history of poor glycemic control for years  - s/p IVA OD #1 (06.20.20), #2 (07.01.20), #3 (09.11.20), #4 (10.09.20), #5 (11.06.20), #6 (11.24.21), #7 (12.22.21), #8 (01.19.22)  - s/p IVA OS #1 (06.20.20), #2 (07.01.20), #3 (08.13.20), #4 (09.11.20)  #5 (11.06.20), #6 (11.24.21), #7 (12.22.21), #8 (01.19.22)  =========================================== - s/p IVE OU #1 (12.04.20) - sample; #2 (01.06.21), #3 (02.18.22), #4 (03.18.22), #5 (04.18.22), #6 (05.25.22), #7 (07.01.22), #8 (07.29.22), #9 (09.02.22), #10 (09.30.22), #11 (11.04.22), #12 (12.09.23), #13 (01.13.23), #14 (03.02.23), #15 (04.11.23), #16 (06.01.23), #17 (07.11.23), #18 (09.12.23), #19 (11.14.23) - s/p IVE OD #20 (03.12.24), #21 (04.23.24), #22 (06.04.24), #23 (07.16.24), #24 (08.30.24), #25 (10.15.24), #26 (11.27.24), #27 (02.11.25), #28 (03.18.25), #29 (04.29.25), #30 (06.03.25), #31 (07.08.25), #32 (08.12.25), #33 (09.16.25), #34 (10.28.25), #35 (10.28.25) ======================= - s/p IVV OS #1 (03.12.24), #2 (04.23.24), #3 (06.04.24), #4 (07.16.24), #5 (08.30.24), #6 (10.15.24), #7 (11.27.24), #8 (02.11.25), #9 (03.18.25), #10 (04.29.25), #11 (06.03.25) , #12 (07.08.25), #13 (08.12.25), #14 (09.16.25), #15 (10.28.25), #16 (10.28.26) ========================  - s/p STK OS #1 (10.09.20)  - s/p PRP OS (06.10.20)  - s/p PPV/PFC/EL/FAX/silicone oil OD, 07.16.20  - s/p subconj silicon oil removal OD 8.20.20  - s/p PPV/EL/FAX/silicone oil OS, 08.20.20  - BCVA 20/150 OU -- stable             - OCT shows OD: Persistent central thickening/edema; OS: persistent central cystic changes -- slightly improved at 7 weeks  - recommend IVE OD #36 and IVV OS #17 (02.03.26) today with f/u ext to 7-8 weeks  - RBA of procedure discussed, questions answered  - IVV informed consent obtained  and signed, 01.02.25 (OU)  - IVE informed consent obtained and singed, 01.02.25  - see procedure note             - [place]  benefits investigation begun 01.19.22 -- approved for 2025  - cont topical PF and Prolensa  QID OU for possible CME component  - f/u 7-8 weeks -- DFE/OCT, possible injection(s)  6,7. Hypertensive retinopathy OU  - discussed importance of tight BP control  - continue to monitor  8. Pseudophakia OU  - s/p CE with IOL [place])  Ophthalmic Meds Ordered this visit:  No orders of the defined types  were placed in this encounter.    No follow-ups on file.  There are no Patient Instructions on file for this visit.  This document serves as a record of services personally performed by Redell JUDITHANN Hans, MD, PhD. It was created on their behalf by Wanda GEANNIE Keens, COT an ophthalmic technician. The creation of this record is the provider's dictation and/or activities during the visit.    Electronically signed by:  Wanda GEANNIE Keens, COT  08/30/24 7:33 AM   Redell JUDITHANN Hans, M.D., Ph.D. Diseases & Surgery of the Retina and Vitreous [facility]   Abbreviations: M myopia (nearsighted); A astigmatism; H hyperopia (farsighted); P presbyopia; Mrx spectacle prescription;  CTL contact lenses; OD right eye; OS left eye; OU both eyes  XT exotropia; ET esotropia; PEK punctate epithelial keratitis; PEE punctate epithelial erosions; DES dry eye syndrome; MGD meibomian gland dysfunction; ATs artificial tears; PFAT's preservative free artificial tears; NSC nuclear sclerotic cataract; PSC posterior subcapsular cataract; ERM epi-retinal membrane; PVD posterior vitreous detachment; RD retinal detachment; DM diabetes mellitus; DR diabetic retinopathy; NPDR non-proliferative diabetic retinopathy; PDR proliferative diabetic retinopathy; CSME clinically significant macular edema; DME diabetic macular edema; dbh dot blot hemorrhages; CWS cotton wool spot; POAG primary open  angle glaucoma; C/D cup-to-disc ratio; HVF humphrey visual field; GVF goldmann visual field; OCT optical coherence tomography; IOP intraocular pressure; BRVO Branch retinal vein occlusion; CRVO central retinal vein occlusion; CRAO central retinal artery occlusion; BRAO branch retinal artery occlusion; RT retinal tear; SB scleral buckle; PPV pars plana vitrectomy; VH Vitreous hemorrhage; PRP panretinal laser photocoagulation; IVK intravitreal kenalog ; VMT vitreomacular traction; MH Macular hole;  NVD neovascularization of the disc; NVE neovascularization elsewhere; AREDS age related eye disease study; ARMD age related macular degeneration; POAG primary open angle glaucoma; EBMD epithelial/anterior basement membrane dystrophy; ACIOL anterior chamber intraocular lens; IOL intraocular lens; PCIOL posterior chamber intraocular lens; Phaco/IOL phacoemulsification with intraocular lens placement; PRK photorefractive keratectomy; LASIK laser assisted in situ keratomileusis; HTN hypertension; DM diabetes mellitus; COPD chronic obstructive pulmonary disease "

## 2024-09-13 ENCOUNTER — Encounter (INDEPENDENT_AMBULATORY_CARE_PROVIDER_SITE_OTHER): Admitting: Ophthalmology

## 2024-09-13 DIAGNOSIS — Z961 Presence of intraocular lens: Secondary | ICD-10-CM

## 2024-09-13 DIAGNOSIS — I10 Essential (primary) hypertension: Secondary | ICD-10-CM

## 2024-09-13 DIAGNOSIS — Z7985 Long-term (current) use of injectable non-insulin antidiabetic drugs: Secondary | ICD-10-CM

## 2024-09-13 DIAGNOSIS — E113513 Type 2 diabetes mellitus with proliferative diabetic retinopathy with macular edema, bilateral: Secondary | ICD-10-CM

## 2024-09-13 DIAGNOSIS — H35033 Hypertensive retinopathy, bilateral: Secondary | ICD-10-CM

## 2024-09-13 DIAGNOSIS — H3343 Traction detachment of retina, bilateral: Secondary | ICD-10-CM

## 2024-09-13 DIAGNOSIS — H4313 Vitreous hemorrhage, bilateral: Secondary | ICD-10-CM

## 2024-09-13 DIAGNOSIS — Z7984 Long term (current) use of oral hypoglycemic drugs: Secondary | ICD-10-CM

## 2024-09-15 NOTE — Progress Notes (Shared)
 "  [facility] - Clinic Note  09/27/2024    CHIEF COMPLAINT Patient presents for No chief complaint on file.  HISTORY OF PRESENT ILLNESS: Randy Oneal is a 59 y.o. male who presents to the clinic today for:   Patient states   Referring physician: [name], MD [address] [place],  [place] [address]  HISTORICAL INFORMATION:   Selected notes from the MEDICAL RECORD NUMBER Referred by Dr.Mark Roz for concern of decreased vision post cataract sx   CURRENT MEDICATIONS: Current Outpatient Medications (Ophthalmic Drugs)  Medication Sig   Bromfenac  Sodium (PROLENSA ) 0.07 % SOLN Place 1 drop into both eyes 4 (four) times daily.   Bromfenac  Sodium 0.07 % SOLN Place 1 drop into both eyes 4 (four) times daily.   Bromfenac  Sodium 0.07 % SOLN Place 1 drop into both eyes 4 (four) times daily.   ketorolac  (ACULAR ) 0.5 % ophthalmic solution Place 1 drop into both eyes 4 (four) times daily.   prednisoLONE  acetate (PRED FORTE ) 1 % ophthalmic suspension PLACE (1) DROP INTO BOTH EYES FOUR TIMES DAILY   No current facility-administered medications for this visit. (Ophthalmic Drugs)   Current Outpatient Medications (Other)  Medication Sig   acetaminophen  (TYLENOL ) 500 MG tablet Take 1,000 mg by mouth every 6 (six) hours as needed for moderate pain or headache.   amoxicillin -clavulanate (AUGMENTIN ) 500-125 MG tablet Take 1 tablet by mouth 2 (two) times daily. (Patient not taking: Reported on 05/24/2024)   benzonatate  (TESSALON ) 200 MG capsule Take 1 capsule (200 mg total) by mouth 3 (three) times daily as needed for cough.   cinacalcet  (SENSIPAR ) 60 MG tablet Take 60 mg by mouth every Monday, Wednesday, and Friday.   doxycycline  (VIBRA -TABS) 100 MG tablet Take 1 tablet (100 mg total) by mouth every 12 (twelve) hours.   insulin  aspart (NOVOLOG  FLEXPEN) 100 UNIT/ML FlexPen Inject 10-14 Units into the skin 3 (three) times daily with meals.   insulin  degludec  (TRESIBA  FLEXTOUCH) 100 UNIT/ML FlexTouch Pen Inject 20 Units into the skin at bedtime.   Insulin  Pen Needle 32G X 4 MM MISC 1 Device by Does not apply route in the morning, at noon, in the evening, and at bedtime.   lidocaine  (XYLOCAINE ) 2 % jelly Apply topically.   sucroferric oxyhydroxide (VELPHORO ) 500 MG chewable tablet Chew 1,000 mg by mouth 3 (three) times daily with meals.   Wound Cleansers (VASHE CLEANSING) SOLN Apply topically.   No current facility-administered medications for this visit. (Other)   REVIEW OF SYSTEMS:   ALLERGIES Allergies  Allergen Reactions   Tramadol  Nausea And Vomiting   PAST MEDICAL HISTORY Past Medical History:  Diagnosis Date   Anemia    Cataract 07/02/2020   Diabetes mellitus    Type 2    Diabetic retinopathy of both eyes (HCC) 01/31/2019   Dr Valdemar 01/2019   ESRD (end stage renal disease) (HCC)    [place] Frenisus   Herpes simplex virus (HSV) infection    OU   Hypertension    Hypertensive retinopathy    OU   Retinal detachment    OS   Sepsis due to Streptococcus, group B (HCC) 10/05/2018   Past Surgical History:  Procedure Laterality Date   25 GAUGE PARS PLANA VITRECTOMY WITH 20 GAUGE MVR PORT FOR MACULAR HOLE Right 02/24/2019   Procedure: 25 GAUGE PARS PLANA VITRECTOMY WITH 20 GAUGE MVR PORT FOR MACULAR HOLE;  Surgeon: [name], MD;  Location: [facility] OR;  Service:  Ophthalmology;  Laterality: Right;   A/V FISTULAGRAM Left 06/02/2024   Procedure: A/V Fistulagram;  Surgeon: Sheree Penne Bruckner, MD;  Location: [facility];  Service: Cardiovascular;  Laterality: Left;   APPLICATION OF WOUND VAC Left 08/27/2018   Procedure: APPLICATION OF WOUND VAC, left neck;  Surgeon: Lucas Dorise POUR, MD;  Location: [facility] OR;  Service: Thoracic;  Laterality: Left;   APPLICATION OF WOUND VAC Left 08/30/2018   Procedure: APPLICATION OF WOUND VAC;  Surgeon: Lucas Dorise POUR, MD;  Location: [facility] OR;  Service: Thoracic;  Laterality: Left;   AV FISTULA PLACEMENT  Left 02/15/2020   Procedure: LEFT ARM ARTERIOVENOUS (AV) FISTULA CREATION;  Surgeon: Sheree Penne Bruckner, MD;  Location: [facility] OR;  Service: Vascular;  Laterality: Left;   CATARACT EXTRACTION Bilateral    CATARACT EXTRACTION W/ INTRAOCULAR LENS  IMPLANT, BILATERAL     COLONOSCOPY  06/24/2011   Procedure: COLONOSCOPY;  Surgeon: Margo CHRISTELLA Haddock, MD;  Location: [facility]ITE;  Service: Endoscopy;  Laterality: N/A;  8:30 AM   EYE SURGERY     GAS/FLUID EXCHANGE Left 03/31/2019   Procedure: Gas/Fluid Exchange;  Surgeon: [name], MD;  Location: [facility] OR;  Service: Ophthalmology;  Laterality: Left;   INJECTION OF SILICONE OIL  02/24/2019   Procedure: Injection Of Silicone Oil;  Surgeon: [name], MD;  Location: [facility] OR;  Service: Ophthalmology;;   INSERTION OF DIALYSIS CATHETER Right 12/05/2019   Procedure: INSERTION OF DIALYSIS CATHETER RIGHT SUBCLAVIAN;  Surgeon: Kallie Manuelita BROCKS, MD;  Location: [facility] ORS;  Service: General;  Laterality: Right;   INSERTION OF DIALYSIS CATHETER Right 12/07/2019   Procedure: Minor Dialysis catheter in place- need to reposition/ adjust catheter to help with flows;  Surgeon: Kallie Manuelita BROCKS, MD;  Location: [facility] ORS;  Service: General;  Laterality: Right;  procedure room case with 1% lidocaine , full sterile drape,  towels, full gown, large chloraprep, 4-0 Monocryl and dermabond    INSERTION OF DIALYSIS CATHETER Right 12/08/2019   Procedure: INSERTION OF DIALYSIS CATHETER EXCHANGE;  Surgeon: Kallie Manuelita BROCKS, MD;  Location: [facility] ORS;  Service: General;  Laterality: Right;   IR ANGIOGRAM EXTREMITY RIGHT  03/15/2021   IR RADIOLOGIST EVAL & MGMT  02/07/2021   IR RADIOLOGIST EVAL & MGMT  05/09/2021   IR US  GUIDE VASC ACCESS RIGHT  03/15/2021   MEMBRANE PEEL Right 02/24/2019   Procedure: Ottie Booty;  Surgeon: [name], MD;  Location: [facility] OR;  Service: Ophthalmology;  Laterality: Right;   PARS PLANA VITRECTOMY Left 03/31/2019   Procedure: PARS PLANA VITRECTOMY WITH 25 GAUGE  WITH MEMBRANE PEEL;  Surgeon: [name], MD;  Location: [facility] OR;  Service: Ophthalmology;  Laterality: Left;   PHOTOCOAGULATION WITH LASER Right 02/24/2019   Procedure: Photocoagulation With Laser;  Surgeon: [name], MD;  Location: [facility] OR;  Service: Ophthalmology;  Laterality: Right;   PHOTOCOAGULATION WITH LASER Left 03/31/2019   Procedure: Photocoagulation With Laser;  Surgeon: [name], MD;  Location: [facility] OR;  Service: Ophthalmology;  Laterality: Left;   RETINAL DETACHMENT SURGERY Left 03/31/2019   TRD Repair - Dr. Rogue Valdemar   SILICON OIL REMOVAL Right 03/31/2019   Procedure: Silicon Oil Removal;  Surgeon: [name], MD;  Location: [facility] OR;  Service: Ophthalmology;  Laterality: Right;   STERNAL WOUND DEBRIDEMENT Left 08/27/2018   Procedure: Incision and DEBRIDEMENT Left Chest, Neck and Mediastinum;  Surgeon: Lucas Dorise POUR, MD;  Location: [facility] OR;  Service: Thoracic;  Laterality: Left;   STERNAL WOUND DEBRIDEMENT Left 08/30/2018  Procedure: WOUND VAC CHANGE, LEFT CHEST AND NECK, POSSIBLE DEBRIDEMENT;  Surgeon: Lucas Dorise POUR, MD;  Location: [facility] OR;  Service: Thoracic;  Laterality: Left;   VENOUS ANGIOPLASTY  06/02/2024   Procedure: VENOUS ANGIOPLASTY;  Surgeon: Sheree Penne Bruckner, MD;  Location: [facility] LAB;  Service: Cardiovascular;;  cephalic arch   FAMILY HISTORY Family History  Problem Relation Age of Onset   Hypertension Mother    Colon cancer Neg Hx    SOCIAL HISTORY Social History   Tobacco Use   Smoking status: Never   Smokeless tobacco: Never  Vaping Use   Vaping status: Never Used  Substance Use Topics   Alcohol use: No   Drug use: No       OPHTHALMIC EXAM:  Not recorded    IMAGING AND PROCEDURES  Imaging and Procedures for @TODAY @          ASSESSMENT/PLAN:  No diagnosis found.  1-5. Proliferative diabetic retinopathy with DME, TRD, and vitreous hemorrhage OU (OD > OS)  - last A1c: 8.3 on 10.14.25, 8.3 on 12.10.24  - lost to f/u  from Jan 2021 to Oct 2021 due to loss of insurance coverage  - pt with complex medical history with hospitalization in Jan-Feb 2020 for bacteremia and abscess  - history of poor glycemic control for years  - s/p IVA OD #1 (06.20.20), #2 (07.01.20), #3 (09.11.20), #4 (10.09.20), #5 (11.06.20), #6 (11.24.21), #7 (12.22.21), #8 (01.19.22)  - s/p IVA OS #1 (06.20.20), #2 (07.01.20), #3 (08.13.20), #4 (09.11.20)  #5 (11.06.20), #6 (11.24.21), #7 (12.22.21), #8 (01.19.22)  =========================================== - s/p IVE OU #1 (12.04.20) - sample; #2 (01.06.21), #3 (02.18.22), #4 (03.18.22), #5 (04.18.22), #6 (05.25.22), #7 (07.01.22), #8 (07.29.22), #9 (09.02.22), #10 (09.30.22), #11 (11.04.22), #12 (12.09.23), #13 (01.13.23), #14 (03.02.23), #15 (04.11.23), #16 (06.01.23), #17 (07.11.23), #18 (09.12.23), #19 (11.14.23) - s/p IVE OD #20 (03.12.24), #21 (04.23.24), #22 (06.04.24), #23 (07.16.24), #24 (08.30.24), #25 (10.15.24), #26 (11.27.24), #27 (02.11.25), #28 (03.18.25), #29 (04.29.25), #30 (06.03.25), #31 (07.08.25), #32 (08.12.25), #33 (09.16.25), #34 (10.28.25), #35 (10.28.25) ======================= - s/p IVV OS #1 (03.12.24), #2 (04.23.24), #3 (06.04.24), #4 (07.16.24), #5 (08.30.24), #6 (10.15.24), #7 (11.27.24), #8 (02.11.25), #9 (03.18.25), #10 (04.29.25), #11 (06.03.25) , #12 (07.08.25), #13 (08.12.25), #14 (09.16.25), #15 (10.28.25), #16 (10.28.26) ========================  - s/p STK OS #1 (10.09.20)  - s/p PRP OS (06.10.20)  - s/p PPV/PFC/EL/FAX/silicone oil OD, 07.16.20  - s/p subconj silicon oil removal OD 8.20.20  - s/p PPV/EL/FAX/silicone oil OS, 08.20.20  - BCVA 20/150 OU -- stable             - OCT shows OD: Persistent central thickening/edema; OS: persistent central cystic changes -- slightly improved at 7 weeks  - recommend IVE OD #36 and IVV OS #17 (02.03.26) today with f/u ext to 7-8 weeks  - RBA of procedure discussed, questions answered  - IVV informed consent obtained  and signed, 01.02.25 (OU)  - IVE informed consent obtained and singed, 01.02.25  - see procedure note             - [place]  benefits investigation begun 01.19.22 -- approved for 2025  - cont topical PF and Prolensa  QID OU for possible CME component  - f/u 7-8 weeks -- DFE/OCT, possible injection(s)  6,7. Hypertensive retinopathy OU  - discussed importance of tight BP control  - continue to monitor  8. Pseudophakia OU  - s/p CE with IOL [place])  Ophthalmic Meds Ordered this visit:  No orders of the defined types  were placed in this encounter.    No follow-ups on file.  There are no Patient Instructions on file for this visit.  This document serves as a record of services personally performed by Redell JUDITHANN Hans, MD, PhD. It was created on their behalf by Wanda GEANNIE Keens, COT an ophthalmic technician. The creation of this record is the provider's dictation and/or activities during the visit.    Electronically signed by:  Wanda GEANNIE Keens, COT  09/15/24 7:40 AM   Redell JUDITHANN Hans, M.D., Ph.D. Diseases & Surgery of the Retina and Vitreous [facility]   Abbreviations: M myopia (nearsighted); A astigmatism; H hyperopia (farsighted); P presbyopia; Mrx spectacle prescription;  CTL contact lenses; OD right eye; OS left eye; OU both eyes  XT exotropia; ET esotropia; PEK punctate epithelial keratitis; PEE punctate epithelial erosions; DES dry eye syndrome; MGD meibomian gland dysfunction; ATs artificial tears; PFAT's preservative free artificial tears; NSC nuclear sclerotic cataract; PSC posterior subcapsular cataract; ERM epi-retinal membrane; PVD posterior vitreous detachment; RD retinal detachment; DM diabetes mellitus; DR diabetic retinopathy; NPDR non-proliferative diabetic retinopathy; PDR proliferative diabetic retinopathy; CSME clinically significant macular edema; DME diabetic macular edema; dbh dot blot hemorrhages; CWS cotton wool spot; POAG primary open  angle glaucoma; C/D cup-to-disc ratio; HVF humphrey visual field; GVF goldmann visual field; OCT optical coherence tomography; IOP intraocular pressure; BRVO Branch retinal vein occlusion; CRVO central retinal vein occlusion; CRAO central retinal artery occlusion; BRAO branch retinal artery occlusion; RT retinal tear; SB scleral buckle; PPV pars plana vitrectomy; VH Vitreous hemorrhage; PRP panretinal laser photocoagulation; IVK intravitreal kenalog ; VMT vitreomacular traction; MH Macular hole;  NVD neovascularization of the disc; NVE neovascularization elsewhere; AREDS age related eye disease study; ARMD age related macular degeneration; POAG primary open angle glaucoma; EBMD epithelial/anterior basement membrane dystrophy; ACIOL anterior chamber intraocular lens; IOL intraocular lens; PCIOL posterior chamber intraocular lens; Phaco/IOL phacoemulsification with intraocular lens placement; PRK photorefractive keratectomy; LASIK laser assisted in situ keratomileusis; HTN hypertension; DM diabetes mellitus; COPD chronic obstructive pulmonary disease "

## 2024-09-27 ENCOUNTER — Encounter (INDEPENDENT_AMBULATORY_CARE_PROVIDER_SITE_OTHER): Admitting: Ophthalmology

## 2024-09-27 DIAGNOSIS — Z961 Presence of intraocular lens: Secondary | ICD-10-CM

## 2024-09-27 DIAGNOSIS — H4313 Vitreous hemorrhage, bilateral: Secondary | ICD-10-CM

## 2024-09-27 DIAGNOSIS — I10 Essential (primary) hypertension: Secondary | ICD-10-CM

## 2024-09-27 DIAGNOSIS — H35033 Hypertensive retinopathy, bilateral: Secondary | ICD-10-CM

## 2024-09-27 DIAGNOSIS — E113513 Type 2 diabetes mellitus with proliferative diabetic retinopathy with macular edema, bilateral: Secondary | ICD-10-CM

## 2024-09-27 DIAGNOSIS — Z7985 Long-term (current) use of injectable non-insulin antidiabetic drugs: Secondary | ICD-10-CM

## 2024-09-27 DIAGNOSIS — Z7984 Long term (current) use of oral hypoglycemic drugs: Secondary | ICD-10-CM

## 2024-09-27 DIAGNOSIS — H3343 Traction detachment of retina, bilateral: Secondary | ICD-10-CM

## 2024-11-22 ENCOUNTER — Ambulatory Visit: Admitting: Internal Medicine
